# Patient Record
Sex: Male | Born: 1975 | Race: White | Hispanic: No | Smoking: Former smoker
Health system: Southern US, Community
[De-identification: ages and names within clinical notes are randomized; demographics above are authoritative.]

## PROBLEM LIST (undated history)

## (undated) DIAGNOSIS — D649 Anemia, unspecified: Secondary | ICD-10-CM

## (undated) DIAGNOSIS — N289 Disorder of kidney and ureter, unspecified: Secondary | ICD-10-CM

## (undated) DIAGNOSIS — G4700 Insomnia, unspecified: Secondary | ICD-10-CM

## (undated) DIAGNOSIS — I219 Acute myocardial infarction, unspecified: Secondary | ICD-10-CM

## (undated) DIAGNOSIS — F4310 Post-traumatic stress disorder, unspecified: Secondary | ICD-10-CM

## (undated) DIAGNOSIS — M1990 Unspecified osteoarthritis, unspecified site: Secondary | ICD-10-CM

## (undated) DIAGNOSIS — Z9289 Personal history of other medical treatment: Secondary | ICD-10-CM

## (undated) DIAGNOSIS — M797 Fibromyalgia: Secondary | ICD-10-CM

## (undated) DIAGNOSIS — I639 Cerebral infarction, unspecified: Secondary | ICD-10-CM

## (undated) DIAGNOSIS — Z95818 Presence of other cardiac implants and grafts: Secondary | ICD-10-CM

## (undated) DIAGNOSIS — R55 Syncope and collapse: Secondary | ICD-10-CM

## (undated) DIAGNOSIS — I2510 Atherosclerotic heart disease of native coronary artery without angina pectoris: Secondary | ICD-10-CM

## (undated) DIAGNOSIS — K5900 Constipation, unspecified: Secondary | ICD-10-CM

## (undated) DIAGNOSIS — J45909 Unspecified asthma, uncomplicated: Secondary | ICD-10-CM

## (undated) DIAGNOSIS — E119 Type 2 diabetes mellitus without complications: Secondary | ICD-10-CM

## (undated) DIAGNOSIS — I10 Essential (primary) hypertension: Secondary | ICD-10-CM

## (undated) DIAGNOSIS — F319 Bipolar disorder, unspecified: Secondary | ICD-10-CM

## (undated) DIAGNOSIS — F419 Anxiety disorder, unspecified: Secondary | ICD-10-CM

## (undated) DIAGNOSIS — I82409 Acute embolism and thrombosis of unspecified deep veins of unspecified lower extremity: Secondary | ICD-10-CM

## (undated) DIAGNOSIS — N186 End stage renal disease: Secondary | ICD-10-CM

## (undated) DIAGNOSIS — E785 Hyperlipidemia, unspecified: Secondary | ICD-10-CM

## (undated) DIAGNOSIS — R0600 Dyspnea, unspecified: Secondary | ICD-10-CM

## (undated) DIAGNOSIS — G4730 Sleep apnea, unspecified: Secondary | ICD-10-CM

## (undated) DIAGNOSIS — Z9889 Other specified postprocedural states: Secondary | ICD-10-CM

## (undated) DIAGNOSIS — I739 Peripheral vascular disease, unspecified: Secondary | ICD-10-CM

## (undated) HISTORY — PX: AV FISTULA PLACEMENT: SHX1204

## (undated) HISTORY — PX: HERNIA REPAIR: SHX51

## (undated) HISTORY — PX: TONSILLECTOMY: SUR1361

## (undated) HISTORY — PX: CHOLECYSTECTOMY: SHX55

## (undated) HISTORY — PX: JOINT REPLACEMENT: SHX530

## (undated) HISTORY — PX: AV FISTULA INSERTION W/ RF MAGNETIC GUIDANCE: CATH118308

---

## 2016-03-14 DIAGNOSIS — E785 Hyperlipidemia, unspecified: Secondary | ICD-10-CM | POA: Insufficient documentation

## 2017-04-29 DIAGNOSIS — L299 Pruritus, unspecified: Secondary | ICD-10-CM | POA: Insufficient documentation

## 2017-04-29 DIAGNOSIS — K582 Mixed irritable bowel syndrome: Secondary | ICD-10-CM | POA: Insufficient documentation

## 2017-04-29 DIAGNOSIS — N051 Unspecified nephritic syndrome with focal and segmental glomerular lesions: Secondary | ICD-10-CM | POA: Insufficient documentation

## 2017-07-09 DIAGNOSIS — G4733 Obstructive sleep apnea (adult) (pediatric): Secondary | ICD-10-CM | POA: Insufficient documentation

## 2017-09-21 DIAGNOSIS — Z992 Dependence on renal dialysis: Secondary | ICD-10-CM | POA: Insufficient documentation

## 2019-03-14 DIAGNOSIS — K219 Gastro-esophageal reflux disease without esophagitis: Secondary | ICD-10-CM | POA: Insufficient documentation

## 2019-03-14 DIAGNOSIS — M47812 Spondylosis without myelopathy or radiculopathy, cervical region: Secondary | ICD-10-CM | POA: Insufficient documentation

## 2019-03-21 ENCOUNTER — Encounter (HOSPITAL_COMMUNITY): Payer: Self-pay | Admitting: Emergency Medicine

## 2019-03-21 ENCOUNTER — Other Ambulatory Visit: Payer: Self-pay

## 2019-03-21 ENCOUNTER — Emergency Department (HOSPITAL_COMMUNITY): Payer: Medicare Other

## 2019-03-21 ENCOUNTER — Inpatient Hospital Stay (HOSPITAL_COMMUNITY)
Admission: EM | Admit: 2019-03-21 | Discharge: 2019-03-24 | DRG: 280 | Disposition: A | Discharge status: Home / Self Care | Payer: Medicare Other | Attending: Internal Medicine | Admitting: Internal Medicine

## 2019-03-21 DIAGNOSIS — Z20822 Contact with and (suspected) exposure to covid-19: Secondary | ICD-10-CM | POA: Diagnosis present

## 2019-03-21 DIAGNOSIS — Z6841 Body Mass Index (BMI) 40.0 and over, adult: Secondary | ICD-10-CM

## 2019-03-21 DIAGNOSIS — Z87891 Personal history of nicotine dependence: Secondary | ICD-10-CM

## 2019-03-21 DIAGNOSIS — I69354 Hemiplegia and hemiparesis following cerebral infarction affecting left non-dominant side: Secondary | ICD-10-CM

## 2019-03-21 DIAGNOSIS — D631 Anemia in chronic kidney disease: Secondary | ICD-10-CM | POA: Diagnosis present

## 2019-03-21 DIAGNOSIS — Z885 Allergy status to narcotic agent status: Secondary | ICD-10-CM

## 2019-03-21 DIAGNOSIS — I214 Non-ST elevation (NSTEMI) myocardial infarction: Principal | ICD-10-CM

## 2019-03-21 DIAGNOSIS — R55 Syncope and collapse: Secondary | ICD-10-CM | POA: Diagnosis not present

## 2019-03-21 DIAGNOSIS — G43909 Migraine, unspecified, not intractable, without status migrainosus: Secondary | ICD-10-CM | POA: Diagnosis present

## 2019-03-21 DIAGNOSIS — R079 Chest pain, unspecified: Secondary | ICD-10-CM | POA: Diagnosis not present

## 2019-03-21 DIAGNOSIS — I639 Cerebral infarction, unspecified: Secondary | ICD-10-CM

## 2019-03-21 DIAGNOSIS — Z91040 Latex allergy status: Secondary | ICD-10-CM

## 2019-03-21 DIAGNOSIS — I25111 Atherosclerotic heart disease of native coronary artery with angina pectoris with documented spasm: Secondary | ICD-10-CM | POA: Diagnosis not present

## 2019-03-21 DIAGNOSIS — E876 Hypokalemia: Secondary | ICD-10-CM | POA: Diagnosis not present

## 2019-03-21 DIAGNOSIS — I120 Hypertensive chronic kidney disease with stage 5 chronic kidney disease or end stage renal disease: Secondary | ICD-10-CM | POA: Diagnosis present

## 2019-03-21 DIAGNOSIS — E119 Type 2 diabetes mellitus without complications: Secondary | ICD-10-CM

## 2019-03-21 DIAGNOSIS — D696 Thrombocytopenia, unspecified: Secondary | ICD-10-CM | POA: Diagnosis present

## 2019-03-21 DIAGNOSIS — Z79899 Other long term (current) drug therapy: Secondary | ICD-10-CM

## 2019-03-21 DIAGNOSIS — Z992 Dependence on renal dialysis: Secondary | ICD-10-CM

## 2019-03-21 DIAGNOSIS — Z23 Encounter for immunization: Secondary | ICD-10-CM

## 2019-03-21 DIAGNOSIS — Z794 Long term (current) use of insulin: Secondary | ICD-10-CM

## 2019-03-21 DIAGNOSIS — Z881 Allergy status to other antibiotic agents status: Secondary | ICD-10-CM

## 2019-03-21 DIAGNOSIS — Z8249 Family history of ischemic heart disease and other diseases of the circulatory system: Secondary | ICD-10-CM

## 2019-03-21 DIAGNOSIS — I951 Orthostatic hypotension: Secondary | ICD-10-CM | POA: Diagnosis present

## 2019-03-21 DIAGNOSIS — Z833 Family history of diabetes mellitus: Secondary | ICD-10-CM

## 2019-03-21 DIAGNOSIS — Z9049 Acquired absence of other specified parts of digestive tract: Secondary | ICD-10-CM

## 2019-03-21 DIAGNOSIS — E1122 Type 2 diabetes mellitus with diabetic chronic kidney disease: Secondary | ICD-10-CM | POA: Diagnosis present

## 2019-03-21 DIAGNOSIS — E785 Hyperlipidemia, unspecified: Secondary | ICD-10-CM | POA: Diagnosis present

## 2019-03-21 DIAGNOSIS — N186 End stage renal disease: Secondary | ICD-10-CM

## 2019-03-21 DIAGNOSIS — Z8349 Family history of other endocrine, nutritional and metabolic diseases: Secondary | ICD-10-CM

## 2019-03-21 HISTORY — DX: Cerebral infarction, unspecified: I63.9

## 2019-03-21 HISTORY — DX: Essential (primary) hypertension: I10

## 2019-03-21 HISTORY — DX: Unspecified osteoarthritis, unspecified site: M19.90

## 2019-03-21 HISTORY — DX: Syncope and collapse: R55

## 2019-03-21 HISTORY — DX: Disorder of kidney and ureter, unspecified: N28.9

## 2019-03-21 HISTORY — DX: Type 2 diabetes mellitus without complications: E11.9

## 2019-03-21 HISTORY — DX: Other specified postprocedural states: Z98.890

## 2019-03-21 LAB — BASIC METABOLIC PANEL
Anion gap: 18 — ABNORMAL HIGH (ref 5–15)
BUN: 58 mg/dL — ABNORMAL HIGH (ref 6–20)
CO2: 24 mmol/L (ref 22–32)
Calcium: 10.2 mg/dL (ref 8.9–10.3)
Chloride: 92 mmol/L — ABNORMAL LOW (ref 98–111)
Creatinine, Ser: 15.03 mg/dL — ABNORMAL HIGH (ref 0.61–1.24)
GFR calc Af Amer: 4 mL/min — ABNORMAL LOW (ref >60)
GFR calc non Af Amer: 3 mL/min — ABNORMAL LOW (ref >60)
Glucose, Bld: 158 mg/dL — ABNORMAL HIGH (ref 70–99)
Potassium: 3 mmol/L — ABNORMAL LOW (ref 3.5–5.1)
Sodium: 134 mmol/L — ABNORMAL LOW (ref 135–145)

## 2019-03-21 LAB — CBC WITH DIFFERENTIAL/PLATELET
Abs Immature Granulocytes: 0.06 10*3/uL (ref 0.00–0.07)
Basophils Absolute: 0.1 10*3/uL (ref 0.0–0.1)
Basophils Relative: 1 %
Eosinophils Absolute: 0.1 10*3/uL (ref 0.0–0.5)
Eosinophils Relative: 1 %
HCT: 57.5 % — ABNORMAL HIGH (ref 39.0–52.0)
Hemoglobin: 18.8 g/dL — ABNORMAL HIGH (ref 13.0–17.0)
Immature Granulocytes: 1 %
Lymphocytes Relative: 25 %
Lymphs Abs: 2.5 10*3/uL (ref 0.7–4.0)
MCH: 29.9 pg (ref 26.0–34.0)
MCHC: 32.7 g/dL (ref 30.0–36.0)
MCV: 91.4 fL (ref 80.0–100.0)
Monocytes Absolute: 1.2 10*3/uL — ABNORMAL HIGH (ref 0.1–1.0)
Monocytes Relative: 12 %
Neutro Abs: 6.3 10*3/uL (ref 1.7–7.7)
Neutrophils Relative %: 60 %
Platelets: 101 10*3/uL — ABNORMAL LOW (ref 150–400)
RBC: 6.29 MIL/uL — ABNORMAL HIGH (ref 4.22–5.81)
RDW: 18 % — ABNORMAL HIGH (ref 11.5–15.5)
WBC: 10.2 10*3/uL (ref 4.0–10.5)
nRBC: 0.3 % — ABNORMAL HIGH (ref 0.0–0.2)

## 2019-03-21 LAB — MAGNESIUM: Magnesium: 1.8 mg/dL (ref 1.7–2.4)

## 2019-03-21 LAB — TROPONIN I (HIGH SENSITIVITY)
Troponin I (High Sensitivity): 682 ng/L (ref <18)
Troponin I (High Sensitivity): 939 ng/L (ref <18)

## 2019-03-21 LAB — RESPIRATORY PANEL BY RT PCR (FLU A&B, COVID)
Influenza A by PCR: NEGATIVE
Influenza B by PCR: NEGATIVE
SARS Coronavirus 2 by RT PCR: NEGATIVE

## 2019-03-21 IMAGING — DX DG CHEST 1V PORT
1 series · 1 of 1 positions shown · non-contrast
Comparison: None.

CLINICAL DATA: 41-year-old male with chest pain.

EXAM:
PORTABLE CHEST 1 VIEW

[chest ap]
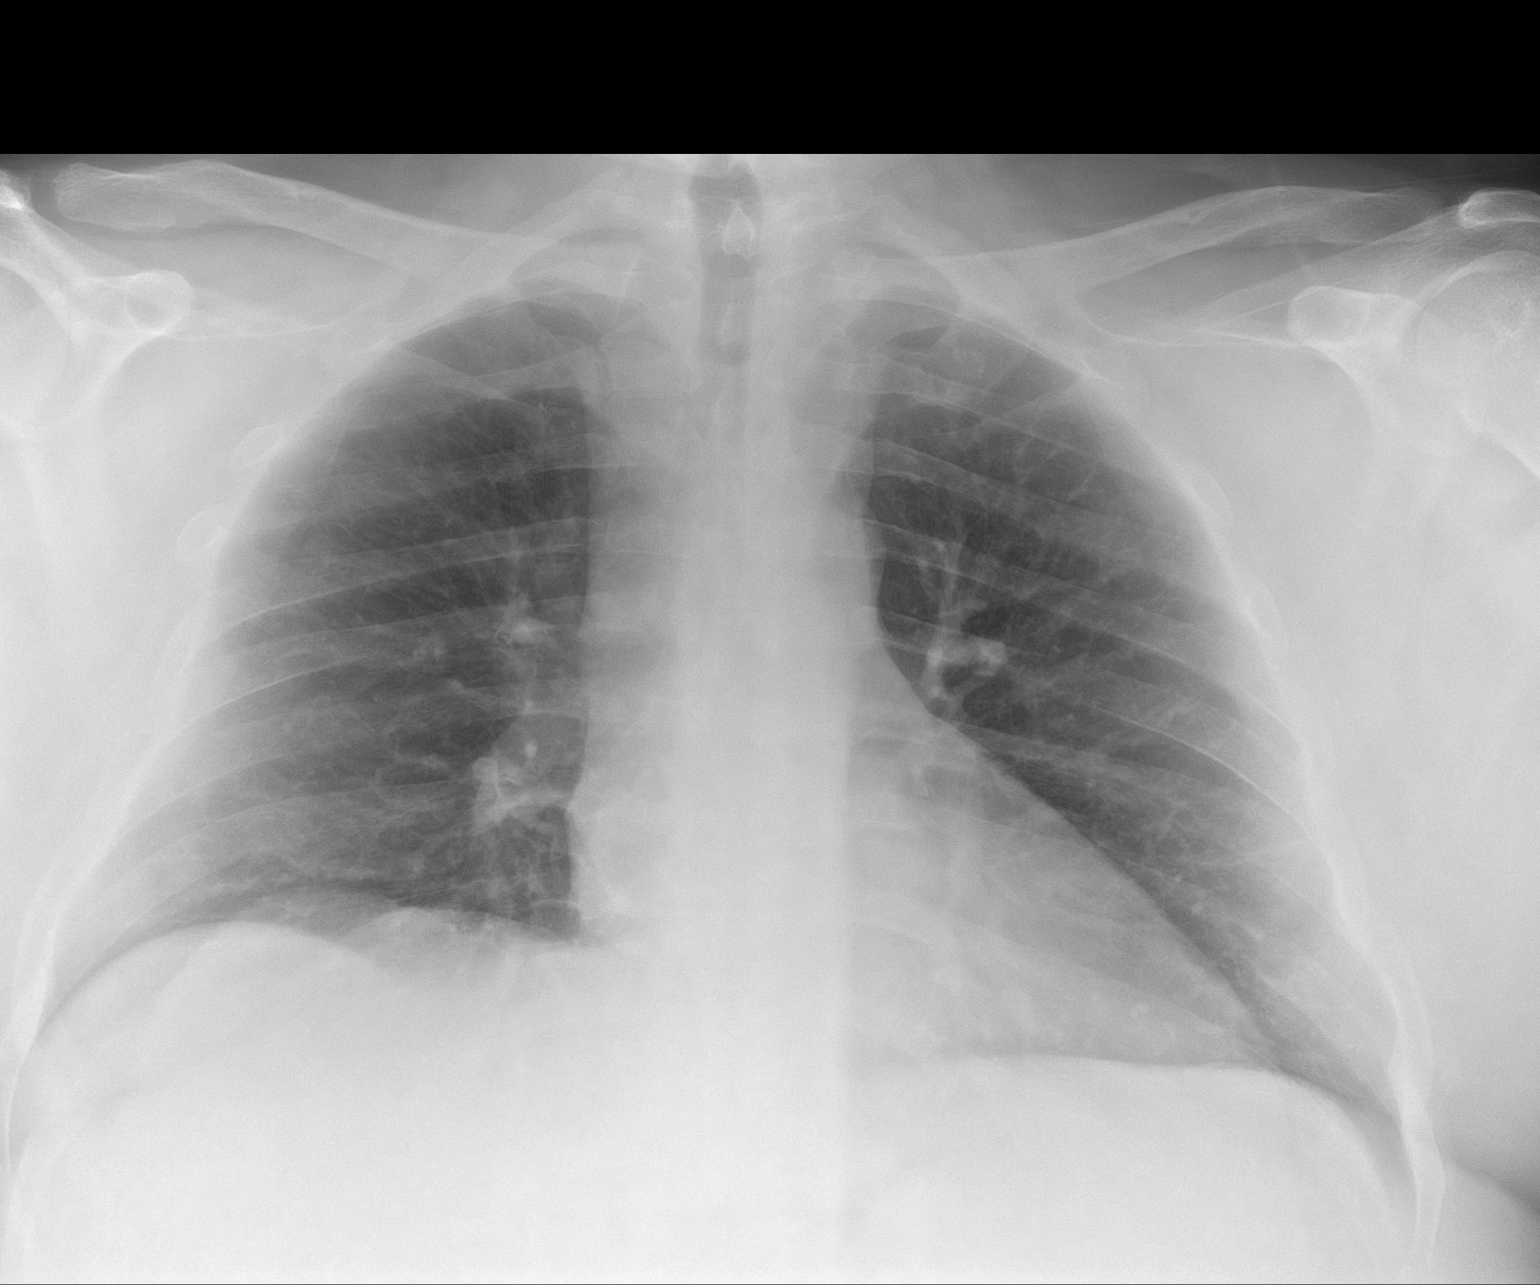

[1 of 1 positions shown; findings below may reference images not displayed]

FINDINGS: The heart size and mediastinal contours are within normal limits.
Both lungs are clear. The visualized skeletal structures are
unremarkable.
IMPRESSION: No active disease.

## 2019-03-21 MED ORDER — POTASSIUM CHLORIDE CRYS ER 20 MEQ PO TBCR
20.0000 meq | EXTENDED_RELEASE_TABLET | Freq: Once | ORAL | Status: AC
  Administered 2019-03-21: 20 meq via ORAL
  Filled 2019-03-21: qty 1

## 2019-03-21 MED ORDER — ASPIRIN 325 MG PO TABS
325.0000 mg | ORAL_TABLET | Freq: Once | ORAL | Status: DC
  Filled 2019-03-21: qty 1

## 2019-03-21 MED ORDER — ATORVASTATIN CALCIUM 40 MG PO TABS
40.0000 mg | ORAL_TABLET | Freq: Every day | ORAL | Status: DC
  Administered 2019-03-22 – 2019-03-23 (×2): 40 mg via ORAL
  Filled 2019-03-21 (×2): qty 1

## 2019-03-21 MED ORDER — SODIUM CHLORIDE 0.9 % IV BOLUS
500.0000 mL | Freq: Once | INTRAVENOUS | Status: AC
  Administered 2019-03-21: 500 mL via INTRAVENOUS

## 2019-03-21 NOTE — ED Triage Notes (Signed)
Patient complains of multiple syncopal episodes, up to 15 times in the last five days.

## 2019-03-21 NOTE — ED Notes (Signed)
Date and time results received: 03/21/19 7:16 PM    Test: Trop  Critical Value: 682  Name of Provider Notified: Dr. Melina Copa  Orders Received? Or Actions Taken?: See chart

## 2019-03-21 NOTE — ED Provider Notes (Signed)
[facility] EMERGENCY DEPARTMENT Provider Note   CSN: [id number] Arrival date & time: 03/21/19  1316     History Chief Complaint  Patient presents with  . syncopal episodes    Max Pittman is a 44 y.o. male.  He has a history of hypertension diabetes and renal disease on peritoneal dialysis.  He also with has a history of vasovagal syncope.  He said he has been passing out more than normal for the last 5 days.  He thinks it has been about 15 times.  He is lightheaded and noticed some low blood pressures.  He also has some chest discomfort and shortness of breath it has been going on for the last 5 days.  No change in his dialysis.  Complaining of some headache and photophobia that he says is his migraine.  He talked to his primary care doctor today who recommended he come here and get some blood work done.  The history is provided by the patient.  Loss of Consciousness Episode history:  Multiple Most recent episode:  Yesterday Timing:  Intermittent Progression:  Unchanged Chronicity:  Recurrent Context: normal activity   Witnessed: no   Relieved by:  Nothing Worsened by:  Nothing Ineffective treatments:  None tried Associated symptoms: chest pain, headaches and shortness of breath   Associated symptoms: no confusion, no difficulty breathing, no fever, no focal weakness, no nausea, no rectal bleeding and no vomiting        Past Medical History:  Diagnosis Date  . Arthritis   . Diabetes ([place])   . History of peritoneal dialysis   . Hypertension   . Renal disorder    FFGS  . Stroke [facility])    when ha was a child  . Vasovagal syndrome    with syncope    There are no problems to display for this patient.   Past Surgical History:  Procedure Laterality Date  . AV FISTULA INSERTION W/ RF MAGNETIC GUIDANCE Left   . CHOLECYSTECTOMY    . HERNIA REPAIR    . TONSILLECTOMY         No family history on file.  Social History   Tobacco Use  . Smoking status: Former Smoker     Quit date: 11/16/2018    Years since quitting: 0.3  . Smokeless tobacco: Former Network engineer Use Topics  . Alcohol use: Not on file    Comment: occ  . Drug use: Never    Home Medications Prior to Admission medications   Not on File    Allergies    Doxycycline and Morphine and related  Review of Systems   Review of Systems  Constitutional: Negative for fever.  HENT: Negative for sore throat.   Eyes: Positive for photophobia. Negative for visual disturbance.  Respiratory: Positive for shortness of breath.   Cardiovascular: Positive for chest pain and syncope.  Gastrointestinal: Negative for abdominal pain, nausea and vomiting.  Genitourinary: Negative for dysuria.  Musculoskeletal: Negative for neck pain.  Skin: Negative for rash.  Neurological: Positive for headaches. Negative for focal weakness and speech difficulty.  Psychiatric/Behavioral: Negative for confusion.    Physical Exam Updated Vital Signs BP 132/80 (BP Location: Right Arm)   Pulse (!) 118   Temp 97.7 F (36.5 C)   Resp 16   SpO2 94%   Physical Exam Vitals and nursing note reviewed.  Constitutional:      Appearance: He is well-developed.  HENT:     Head: Normocephalic and atraumatic.  Eyes:  Conjunctiva/sclera: Conjunctivae normal.  Cardiovascular:     Rate and Rhythm: Normal rate and regular rhythm.     Heart sounds: No murmur.  Pulmonary:     Effort: Pulmonary effort is normal. No respiratory distress.     Breath sounds: Normal breath sounds.  Abdominal:     Palpations: Abdomen is soft.     Tenderness: There is no abdominal tenderness. There is no guarding or rebound.  Musculoskeletal:        General: No deformity or signs of injury. Normal range of motion.     Cervical back: Neck supple.  Skin:    General: Skin is warm and dry.     Capillary Refill: Capillary refill takes less than 2 seconds.  Neurological:     General: No focal deficit present.     Mental Status: He is alert.      ED Results / Procedures / Treatments   Labs (all labs ordered are listed, but only abnormal results are displayed) Labs Reviewed  CBC WITH DIFFERENTIAL/PLATELET - Abnormal; Notable for the following components:      Result Value   RBC 6.29 (*)    Hemoglobin 18.8 (*)    HCT 57.5 (*)    RDW 18.0 (*)    Platelets 101 (*)    nRBC 0.3 (*)    Monocytes Absolute 1.2 (*)    All other components within normal limits  BASIC METABOLIC PANEL - Abnormal; Notable for the following components:   Sodium 134 (*)    Potassium 3.0 (*)    Chloride 92 (*)    Glucose, Bld 158 (*)    BUN 58 (*)    Creatinine, Ser 15.03 (*)    GFR calc non Af Amer 3 (*)    GFR calc Af Amer 4 (*)    Anion gap 18 (*)    All other components within normal limits  HEMOGLOBIN A1C - Abnormal; Notable for the following components:   Hgb A1c MFr Bld 6.6 (*)    All other components within normal limits  LIPID PANEL - Abnormal; Notable for the following components:   Triglycerides 165 (*)    HDL 29 (*)    All other components within normal limits  TROPONIN I (HIGH SENSITIVITY) - Abnormal; Notable for the following components:   Troponin I (High Sensitivity) 682 (*)    All other components within normal limits  TROPONIN I (HIGH SENSITIVITY) - Abnormal; Notable for the following components:   Troponin I (High Sensitivity) 939 (*)    All other components within normal limits  TROPONIN I (HIGH SENSITIVITY) - Abnormal; Notable for the following components:   Troponin I (High Sensitivity) 1,997 (*)    All other components within normal limits  TROPONIN I (HIGH SENSITIVITY) - Abnormal; Notable for the following components:   Troponin I (High Sensitivity) 3,490 (*)    All other components within normal limits  RESPIRATORY PANEL BY RT PCR (FLU A&B, COVID)  MAGNESIUM  HEPARIN LEVEL (UNFRACTIONATED)  HIV ANTIBODY (ROUTINE TESTING W REFLEX)  HEPARIN LEVEL (UNFRACTIONATED)    EKG EKG  Interpretation  Date/Time:  Tuesday March 21 2019 13:47:14 EST Ventricular Rate:  119 PR Interval:  176 QRS Duration: 82 QT Interval:  320 QTC Calculation: 450 R Axis:   -167 Text Interpretation: Sinus tachycardia Right ventricular hypertrophy Septal infarct , age undetermined Lateral infarct , age undetermined Abnormal ECG No old tracing to compare Confirmed by Aletta Edouard [phone]) on 03/21/2019 6:27:35 PM   Radiology DG  Chest Port 1 View  Result Date: 03/21/2019 CLINICAL DATA:  44 year old male with chest pain. EXAM: PORTABLE CHEST 1 VIEW COMPARISON:  None. FINDINGS: The heart size and mediastinal contours are within normal limits. Both lungs are clear. The visualized skeletal structures are unremarkable. IMPRESSION: No active disease. Electronically Signed   By: Anner Crete M.D.   On: 03/21/2019 19:16    Procedures .Critical Care Performed by: Hayden Rasmussen, MD Authorized by: Hayden Rasmussen, MD   Critical care provider statement:    Critical care time (minutes):  45   Critical care time was exclusive of:  Separately billable procedures and treating other patients   Critical care was necessary to treat or prevent imminent or life-threatening deterioration of the following conditions:  Cardiac failure and circulatory failure   Critical care was time spent personally by me on the following activities:  Discussions with consultants, evaluation of patient's response to treatment, examination of patient, ordering and performing treatments and interventions, ordering and review of laboratory studies, ordering and review of radiographic studies, pulse oximetry, re-evaluation of patient's condition, obtaining history from patient or surrogate, review of old charts and development of treatment plan with patient or surrogate   I assumed direction of critical care for this patient from another provider in my specialty: no     (including critical care time)  Medications Ordered  in ED Medications  buPROPion (WELLBUTRIN SR) 12 hr tablet 150 mg (150 mg Oral Not Given 03/22/19 0805)  DULoxetine (CYMBALTA) DR capsule 60 mg (60 mg Oral Given 03/22/19 0804)  pregabalin (LYRICA) capsule 100 mg (100 mg Oral Given 03/22/19 0804)  acetaminophen (TYLENOL) tablet 650 mg (has no administration in time range)  ondansetron (ZOFRAN) injection 4 mg (has no administration in time range)  aspirin tablet 325 mg (325 mg Oral Not Given 03/22/19 0228)  atorvastatin (LIPITOR) tablet 40 mg (40 mg Oral Not Given 03/22/19 0229)  heparin ADULT infusion 100 units/mL (25000 units/229mL sodium chloride 0.45%) (1,100 Units/hr Intravenous New Bag/Given 03/22/19 0244)  famotidine (PEPCID) tablet 20 mg (20 mg Oral Given 03/22/19 0805)  pneumococcal 23 valent vaccine (PNEUMOVAX-23) injection 0.5 mL (has no administration in time range)  sevelamer carbonate (RENVELA) tablet 3,200 mg (3,200 mg Oral Not Given 03/22/19 0805)  sevelamer carbonate (RENVELA) tablet 1,600 mg (has no administration in time range)  nitroGLYCERIN (NITROSTAT) SL tablet 0.4 mg (has no administration in time range)  sodium chloride 0.9 % bolus 500 mL (0 mLs Intravenous Stopped 03/21/19 2143)  potassium chloride SA (KLOR-CON) CR tablet 20 mEq (20 mEq Oral Given 03/21/19 2139)  magnesium sulfate IVPB 2 g 50 mL (0 g Intravenous Stopped 03/22/19 0355)  potassium chloride SA (KLOR-CON) CR tablet 40 mEq (40 mEq Oral Given 03/22/19 0249)  heparin bolus via infusion 2,500 Units (2,500 Units Intravenous Bolus from Bag 03/22/19 0245)    ED Course  I have reviewed the triage vital signs and the nursing notes.  Pertinent labs & imaging results that were available during my care of the patient were reviewed by me and considered in my medical decision making (see chart for details).  Clinical Course as of Mar 21 1012  Tue Mar 20, 7762  [id number] 44 year old male multiple medical problems here with multiple episodes of syncope over the past few days.   Tachycardic here.  EKG abnormal with no priors to compare with.  Differential includes arrhythmia, metabolic derangement, hypovolemia, ACS, infection   [MB]  2112 Troponin elevated here at 682.  Potassium mildly  low at 3.0 and magnesium normal.  Creatinine elevated at 15 but he has a peritoneal dialysis patient.    [MB]  2112 Discussed with Dr. Denton Brick from [facility] who will evaluate the patient for admission down to [place].   [MB]  2119 Discussed with Dr. Kalman Shan [facility] cardiology.  He feels the patient will need to be admitted for an echo.  States this may be more infiltrative for cardiomyopathy then ischemia.  Recommend holding on heparin unless delta troponin rising significantly.   [MB]    Clinical Course User Index [MB] Hayden Rasmussen, MD   MDM Rules/Calculators/A&P                       Final Clinical Impression(s) / ED Diagnoses Final diagnoses:  Syncope, unspecified syncope type  ESRD on peritoneal dialysis [facility])  Hypokalemia  NSTEMI (non-ST elevated myocardial infarction) [facility])    Rx / DC Orders ED Discharge Orders    None       Hayden Rasmussen, MD 03/22/19 1017

## 2019-03-21 NOTE — ED Notes (Signed)
Date and time results received: 03/21/19 2200 (use smartphrase ".now" to insert current time)  Test: troponin Critical Value: 939  Name of Provider Notified: Dr. Denton Brick notified via [place]? Or Actions Taken?:

## 2019-03-21 NOTE — H&P (Addendum)
History and Physical    Max Pittman [id number] DOB: 12/26/1975 DOA: 03/21/2019  PCP: Chesley Noon, MD   Patient coming from: Chest Pain, syncope  I have personally briefly reviewed patient's old medical records in [place]  Chief Complaint: Passing out, chest pain  HPI: Max Pittman is a 44 y.o. male with medical history significant for peritoneal hemodialysis, stroke, hypertension and diabetes mellitus, vasovagal syncope Reports syncopal episodes up to 15 times a day over the past 5 days.  Reports lightheadedness and noted some low blood pressures.  Patient also reported persistent chest pain over the past 2 days, left to right side of his chest anteriorly, with associated difficulty breathing.  Chest pain started in the morning when he woke up, and is worse with activity and improves with rest.  He denies associated nausea vomiting or diaphoresis.   He denies history of heart attacks, but reports a stroke when he was 44 years old, with mild residual left upper and lower extremity weakness, but he is able to ambulate without any assistance or assistive devices..  No leg swelling redness or pain.  He does not take aspirin daily.  He quit smoking cigarettes about 20 half years ago, and quit vaping 6 months ago.  He very rarely drinks alcohol maybe once in a year.  He denies known family history of premature coronary artery disease.  ED Course: Heart rates 99-1 1 8, blood pressure systolic 123456 32, O2 sats greater than 94% on room air.  Platelets low 101.  Hs troponin 682 >> 939.  EKG showed sinus tachycardia, with ST abnormalities concerning for ACS. EDP talked to cardiologist on-call, recommended echocardiogram, start anticoagulation if troponin significantly more elevated.  Hospitalist to admit for further evaluation and management.  Review of Systems: As per HPI all other systems reviewed and negative.  Past Medical History:  Diagnosis Date  . Arthritis   . Diabetes ([place])   .  History of peritoneal dialysis   . Hypertension   . Renal disorder    FFGS  . Stroke [facility])    when ha was a child  . Vasovagal syndrome    with syncope     reports that he quit smoking about 4 months ago. He has quit using smokeless tobacco. He reports that he does not use drugs. No history on file for alcohol.  Allergies  Allergen Reactions  . Doxycycline Hives  . Latex Swelling    Pt reports swelling at site.   . Morphine And Related Anxiety   No family history of premature coronary artery disease.  Prior to Admission medications   Medication Sig Start Date End Date Taking? Authorizing Provider  atorvastatin (LIPITOR) 20 MG tablet Take 20 mg by mouth daily. 01/12/19  Yes [provider]  buPROPion (WELLBUTRIN SR) 150 MG 12 hr tablet Take 150 mg by mouth 2 (two) times daily. 01/20/19  Yes [provider]  DULoxetine (CYMBALTA) 60 MG capsule Take 60 mg by mouth 2 (two) times daily. 01/11/19  Yes [provider]  furosemide (LASIX) 40 MG tablet Take 120 mg by mouth in the morning and at bedtime.   Yes [provider]  gentamicin cream (GARAMYCIN) 0.1 % Apply 1 application topically every other day. Applied to catheter post cleaning   Yes [provider]  HYDROcodone-acetaminophen (NORCO/VICODIN) 5-325 MG tablet Take 1 tablet by mouth every 8 (eight) hours as needed for moderate pain.  02/20/19  Yes [provider]  hydrOXYzine (ATARAX/VISTARIL) 25  MG tablet Take 25 mg by mouth every 8 (eight) hours as needed for itching.  01/21/19  Yes [provider]  Insulin Glargine (BASAGLAR KWIKPEN) 100 UNIT/ML SOPN Inject 22 Units into the skin every evening.  03/10/19  Yes [provider]  pioglitazone (ACTOS) 45 MG tablet Take 45 mg by mouth daily. 01/21/19  Yes [provider]  pregabalin (LYRICA) 100 MG capsule Take 100 mg by mouth 3 (three) times daily. 02/20/19  Yes [provider]  RENVELA 800 MG  tablet Take 1,600-3,200 mg by mouth See admin instructions. Take 4 tablets (3200mg  total)  by mouth with meals three times daily and take 2 tablets (1600mg  total) with snacks 12/07/18  Yes [provider]  tiZANidine (ZANAFLEX) 4 MG tablet Take 4 mg by mouth 3 (three) times daily. 03/02/19  Yes [provider]  TRULICITY 1.5 0000000 SOPN Inject 1.5 mg into the skin every Tuesday.  03/10/19  Yes [provider]  carvedilol (COREG) 25 MG tablet Take 25 mg by mouth 2 (two) times daily. 01/12/19   [provider]  hydrALAZINE (APRESOLINE) 25 MG tablet Take 25 mg by mouth 3 (three) times daily. 01/13/19   [provider]    Physical Exam: Vitals:   03/21/19 1905 03/21/19 1930 03/21/19 1935 03/21/19 2100  BP:  (!) 84/73 (!) 92/58 102/72  Pulse: 100 100  99  Resp:   (!) 31 16  Temp:      SpO2: 96% 98%  97%    Constitutional: NAD, calm, comfortable Vitals:   03/21/19 1905 03/21/19 1930 03/21/19 1935 03/21/19 2100  BP:  (!) 84/73 (!) 92/58 102/72  Pulse: 100 100  99  Resp:   (!) 31 16  Temp:      SpO2: 96% 98%  97%   Eyes: PERRL, lids and conjunctivae normal ENMT: Mucous membranes are moist. Posterior pharynx clear of any exudate or lesions.Normal dentition.  Neck: normal, supple, no masses, no thyromegaly Respiratory: clear to auscultation bilaterally, no wheezing, no crackles. Normal respiratory effort. No accessory muscle use.  Cardiovascular: Difficult to auscultate due to habitus, but no appreciable murmurs / rubs / gallops. No extremity edema. 2+ pedal pulses.   Abdomen: no tenderness, no masses palpated. No hepatosplenomegaly. Bowel sounds positive.  Musculoskeletal: no clubbing / cyanosis. No joint deformity upper and lower extremities. Good ROM, no contractures. Normal muscle tone.  Skin: no rashes, lesions, ulcers. No induration Neurologic: CN 2-12 grossly intact. Strength 5/5 in all 4.  Psychiatric: Normal judgment and insight. Alert and  oriented x 3. Normal mood.   Labs on Admission: I have personally reviewed following labs and imaging studies  CBC: Recent Labs  Lab 03/21/19 1815  WBC 10.2  NEUTROABS 6.3  HGB 18.8*  HCT 57.5*  MCV 91.4  PLT 99991111*   Basic Metabolic Panel: Recent Labs  Lab 03/21/19 1815  NA 134*  K 3.0*  CL 92*  CO2 24  GLUCOSE 158*  BUN 58*  CREATININE 15.03*  CALCIUM 10.2  MG 1.8    Radiological Exams on Admission: DG Chest Port 1 View  Result Date: 03/21/2019 CLINICAL DATA:  44 year old male with chest pain. EXAM: PORTABLE CHEST 1 VIEW COMPARISON:  None. FINDINGS: The heart size and mediastinal contours are within normal limits. Both lungs are clear. The visualized skeletal structures are unremarkable. IMPRESSION: No active disease. Electronically Signed   By: Anner Crete M.D.   On: 03/21/2019 19:16    EKG: Independently reviewed.  Sinus tachycardial, QTc  450.  Diffuse ST and T wave abnormalities.  Assessment/Plan Principal Problem:   Syncope Active Problems:   Chest pain   Peritoneal dialysis status (HCC)   Stroke (HCC)   Diabetes mellitus ([place])   Chest pain and syncope- troponin elevated at 682 >> 939.  EKG with diffuse ST and T wave abnormalities.  Concerning for acute coronary syndrome.  Denies premature coronary artery disease history.  But high risk-smoking history, BMI, diabetes.  Platelets low at 101.  Syncopal episode likely secondary to hypotension. -Admit to [place] talked to cardiology at [facility], patient to be seen, recommended not to start heparin unless troponin significantly elevated.  -Obtain echocardiogram -Care order instruction to page cardiology on arrival to [place] troponin -Aspirin 325x1, will defer starting anticoagulation to cardiology. -As needed sublingual nitroglycerin -With hypotensive blood pressure and syncopal episodes, home carvedilol 25mg  held for now -N.p.o. midnight, pending cardiology evaluation -Lipid panel -500  mill bolus normal saline given.  ESRD on Peritoneal dialysis status-barely makes any urine. -Please consult nephrology in a.m. -BMP a.m.  Thrombocytopenia-platelets 101.  Baseline platelets per care everywhere from 2019- 150s to 200s.  Denies alcohol use. -CBC a.m.  Diabetes mellitus-random glucose 158 -Obtain hemoglobin A1c -Hold home Lantus [id number] and Actos, Trulicity - SSI [id number]  Hypertension-hypotensive down to 85/41 -Hold home carvedilol, hydralazine,  History of stroke-reports very mild residual left-sided weakness but is able to ambulate without assistance.  DVT prophylaxis: Heparin Code Status: Full code Family Communication: None at bedside Disposition Plan: ~ 2 days, pending cardiology evaluation Consults called: Cardiology.  Please consult nephrology in the morning. Admission status: Observation, cardiac telemetry   Bethena Roys MD [facility]  03/21/2019, 11:36 PM

## 2019-03-22 ENCOUNTER — Encounter (HOSPITAL_COMMUNITY)
Admission: EM | Disposition: A | Discharge status: Home / Self Care | Payer: Self-pay | Source: Home / Self Care | Attending: Internal Medicine

## 2019-03-22 ENCOUNTER — Encounter (HOSPITAL_COMMUNITY): Payer: Self-pay | Admitting: Internal Medicine

## 2019-03-22 ENCOUNTER — Other Ambulatory Visit: Payer: Self-pay

## 2019-03-22 ENCOUNTER — Observation Stay (HOSPITAL_COMMUNITY): Payer: Medicare Other

## 2019-03-22 DIAGNOSIS — I25111 Atherosclerotic heart disease of native coronary artery with angina pectoris with documented spasm: Secondary | ICD-10-CM | POA: Diagnosis not present

## 2019-03-22 DIAGNOSIS — Z992 Dependence on renal dialysis: Secondary | ICD-10-CM | POA: Diagnosis not present

## 2019-03-22 DIAGNOSIS — Z881 Allergy status to other antibiotic agents status: Secondary | ICD-10-CM | POA: Diagnosis not present

## 2019-03-22 DIAGNOSIS — I120 Hypertensive chronic kidney disease with stage 5 chronic kidney disease or end stage renal disease: Secondary | ICD-10-CM | POA: Diagnosis present

## 2019-03-22 DIAGNOSIS — I214 Non-ST elevation (NSTEMI) myocardial infarction: Secondary | ICD-10-CM | POA: Diagnosis present

## 2019-03-22 DIAGNOSIS — E785 Hyperlipidemia, unspecified: Secondary | ICD-10-CM | POA: Diagnosis present

## 2019-03-22 DIAGNOSIS — Z9049 Acquired absence of other specified parts of digestive tract: Secondary | ICD-10-CM | POA: Diagnosis not present

## 2019-03-22 DIAGNOSIS — N186 End stage renal disease: Secondary | ICD-10-CM

## 2019-03-22 DIAGNOSIS — E1122 Type 2 diabetes mellitus with diabetic chronic kidney disease: Secondary | ICD-10-CM | POA: Diagnosis present

## 2019-03-22 DIAGNOSIS — R079 Chest pain, unspecified: Secondary | ICD-10-CM

## 2019-03-22 DIAGNOSIS — Z794 Long term (current) use of insulin: Secondary | ICD-10-CM | POA: Diagnosis not present

## 2019-03-22 DIAGNOSIS — I200 Unstable angina: Secondary | ICD-10-CM | POA: Diagnosis not present

## 2019-03-22 DIAGNOSIS — I2510 Atherosclerotic heart disease of native coronary artery without angina pectoris: Secondary | ICD-10-CM

## 2019-03-22 DIAGNOSIS — E876 Hypokalemia: Secondary | ICD-10-CM

## 2019-03-22 DIAGNOSIS — D631 Anemia in chronic kidney disease: Secondary | ICD-10-CM | POA: Diagnosis present

## 2019-03-22 DIAGNOSIS — I951 Orthostatic hypotension: Secondary | ICD-10-CM | POA: Diagnosis present

## 2019-03-22 DIAGNOSIS — Z87891 Personal history of nicotine dependence: Secondary | ICD-10-CM | POA: Diagnosis not present

## 2019-03-22 DIAGNOSIS — I208 Other forms of angina pectoris: Secondary | ICD-10-CM | POA: Diagnosis not present

## 2019-03-22 DIAGNOSIS — Z6841 Body Mass Index (BMI) 40.0 and over, adult: Secondary | ICD-10-CM | POA: Diagnosis not present

## 2019-03-22 DIAGNOSIS — Z91040 Latex allergy status: Secondary | ICD-10-CM | POA: Diagnosis not present

## 2019-03-22 DIAGNOSIS — Z79899 Other long term (current) drug therapy: Secondary | ICD-10-CM | POA: Diagnosis not present

## 2019-03-22 DIAGNOSIS — R55 Syncope and collapse: Secondary | ICD-10-CM | POA: Diagnosis not present

## 2019-03-22 DIAGNOSIS — D696 Thrombocytopenia, unspecified: Secondary | ICD-10-CM | POA: Diagnosis present

## 2019-03-22 DIAGNOSIS — E1169 Type 2 diabetes mellitus with other specified complication: Secondary | ICD-10-CM

## 2019-03-22 DIAGNOSIS — G43909 Migraine, unspecified, not intractable, without status migrainosus: Secondary | ICD-10-CM | POA: Diagnosis present

## 2019-03-22 DIAGNOSIS — Z20822 Contact with and (suspected) exposure to covid-19: Secondary | ICD-10-CM | POA: Diagnosis present

## 2019-03-22 DIAGNOSIS — Z885 Allergy status to narcotic agent status: Secondary | ICD-10-CM | POA: Diagnosis not present

## 2019-03-22 DIAGNOSIS — Z23 Encounter for immunization: Secondary | ICD-10-CM | POA: Diagnosis present

## 2019-03-22 DIAGNOSIS — I10 Essential (primary) hypertension: Secondary | ICD-10-CM

## 2019-03-22 DIAGNOSIS — I69354 Hemiplegia and hemiparesis following cerebral infarction affecting left non-dominant side: Secondary | ICD-10-CM | POA: Diagnosis not present

## 2019-03-22 HISTORY — PX: LEFT HEART CATH AND CORONARY ANGIOGRAPHY: CATH118249

## 2019-03-22 LAB — LIPID PANEL
Cholesterol: 115 mg/dL (ref 0–200)
HDL: 29 mg/dL — ABNORMAL LOW (ref >40)
LDL Cholesterol: 53 mg/dL (ref 0–99)
Total CHOL/HDL Ratio: 4 RATIO
Triglycerides: 165 mg/dL — ABNORMAL HIGH (ref <150)
VLDL: 33 mg/dL (ref 0–40)

## 2019-03-22 LAB — BASIC METABOLIC PANEL
Anion gap: 20 — ABNORMAL HIGH (ref 5–15)
BUN: 61 mg/dL — ABNORMAL HIGH (ref 6–20)
CO2: 20 mmol/L — ABNORMAL LOW (ref 22–32)
Calcium: 9.8 mg/dL (ref 8.9–10.3)
Chloride: 97 mmol/L — ABNORMAL LOW (ref 98–111)
Creatinine, Ser: 15.73 mg/dL — ABNORMAL HIGH (ref 0.61–1.24)
GFR calc Af Amer: 4 mL/min — ABNORMAL LOW (ref >60)
GFR calc non Af Amer: 3 mL/min — ABNORMAL LOW (ref >60)
Glucose, Bld: 109 mg/dL — ABNORMAL HIGH (ref 70–99)
Potassium: 3 mmol/L — ABNORMAL LOW (ref 3.5–5.1)
Sodium: 137 mmol/L (ref 135–145)

## 2019-03-22 LAB — GLUCOSE, CAPILLARY
Glucose-Capillary: 99 mg/dL (ref 70–99)
Glucose-Capillary: 104 mg/dL — ABNORMAL HIGH (ref 70–99)

## 2019-03-22 LAB — HEMOGLOBIN A1C
Hgb A1c MFr Bld: 6.6 % — ABNORMAL HIGH (ref 4.8–5.6)
Mean Plasma Glucose: 142.72 mg/dL

## 2019-03-22 LAB — TROPONIN I (HIGH SENSITIVITY)
Troponin I (High Sensitivity): 1997 ng/L (ref <18)
Troponin I (High Sensitivity): 3490 ng/L (ref <18)
Troponin I (High Sensitivity): 2914 ng/L (ref <18)

## 2019-03-22 LAB — ECHOCARDIOGRAM COMPLETE
Height: 69 in
Weight: 4640 oz

## 2019-03-22 LAB — HIV ANTIBODY (ROUTINE TESTING W REFLEX): HIV Screen 4th Generation wRfx: NONREACTIVE

## 2019-03-22 LAB — HEPARIN LEVEL (UNFRACTIONATED): Heparin Unfractionated: 0.47 IU/mL (ref 0.30–0.70)

## 2019-03-22 SURGERY — LEFT HEART CATH AND CORONARY ANGIOGRAPHY
Anesthesia: LOCAL

## 2019-03-22 MED ORDER — BUPROPION HCL ER (SR) 150 MG PO TB12
150.0000 mg | ORAL_TABLET | Freq: Two times a day (BID) | ORAL | Status: DC
  Administered 2019-03-22 – 2019-03-24 (×4): 150 mg via ORAL
  Filled 2019-03-22 (×6): qty 1

## 2019-03-22 MED ORDER — SODIUM CHLORIDE 0.9% FLUSH: 3.0000 mL | INTRAVENOUS | Status: DC | PRN

## 2019-03-22 MED ORDER — SODIUM CHLORIDE 0.9% FLUSH: 3.0000 mL | Freq: Two times a day (BID) | INTRAVENOUS | Status: DC

## 2019-03-22 MED ORDER — SEVELAMER CARBONATE 800 MG PO TABS: 1600.0000 mg | ORAL_TABLET | ORAL | Status: DC

## 2019-03-22 MED ORDER — DELFLEX-LC/1.5% DEXTROSE 344 MOSM/L IP SOLN: INTRAPERITONEAL | Status: DC

## 2019-03-22 MED ORDER — IOHEXOL 350 MG/ML SOLN
INTRAVENOUS | Status: DC | PRN
  Administered 2019-03-22: 75 mL via INTRA_ARTERIAL

## 2019-03-22 MED ORDER — GENTAMICIN SULFATE 0.1 % EX CREA
1.0000 "application " | TOPICAL_CREAM | Freq: Every day | CUTANEOUS | Status: DC
  Filled 2019-03-22: qty 15

## 2019-03-22 MED ORDER — POTASSIUM CHLORIDE CRYS ER 20 MEQ PO TBCR
40.0000 meq | EXTENDED_RELEASE_TABLET | Freq: Once | ORAL | Status: AC
  Administered 2019-03-22: 40 meq via ORAL
  Filled 2019-03-22: qty 2

## 2019-03-22 MED ORDER — FENTANYL CITRATE (PF) 100 MCG/2ML IJ SOLN
INTRAMUSCULAR | Status: AC
  Filled 2019-03-22: qty 2

## 2019-03-22 MED ORDER — SODIUM CHLORIDE 0.9 % IV SOLN: INTRAVENOUS | Status: DC

## 2019-03-22 MED ORDER — PREGABALIN 100 MG PO CAPS
100.0000 mg | ORAL_CAPSULE | Freq: Three times a day (TID) | ORAL | Status: DC
  Administered 2019-03-22 – 2019-03-24 (×7): 100 mg via ORAL
  Filled 2019-03-22 (×4): qty 1
  Filled 2019-03-22: qty 4
  Filled 2019-03-22 (×2): qty 1

## 2019-03-22 MED ORDER — PNEUMOCOCCAL VAC POLYVALENT 25 MCG/0.5ML IJ INJ
0.5000 mL | INJECTION | INTRAMUSCULAR | Status: AC
  Administered 2019-03-23: 0.5 mL via INTRAMUSCULAR
  Filled 2019-03-22: qty 0.5

## 2019-03-22 MED ORDER — FAMOTIDINE 20 MG PO TABS
20.0000 mg | ORAL_TABLET | Freq: Two times a day (BID) | ORAL | Status: DC
  Administered 2019-03-22 – 2019-03-23 (×3): 20 mg via ORAL
  Filled 2019-03-22 (×3): qty 1

## 2019-03-22 MED ORDER — HEPARIN SODIUM (PORCINE) 5000 UNIT/ML IJ SOLN: 5000.0000 [IU] | Freq: Three times a day (TID) | INTRAMUSCULAR | Status: DC

## 2019-03-22 MED ORDER — FENTANYL CITRATE (PF) 100 MCG/2ML IJ SOLN
INTRAMUSCULAR | Status: DC | PRN
  Administered 2019-03-22 (×2): 25 ug via INTRAVENOUS

## 2019-03-22 MED ORDER — HEPARIN (PORCINE) IN NACL 1000-0.9 UT/500ML-% IV SOLN
INTRAVENOUS | Status: AC
  Filled 2019-03-22: qty 1000

## 2019-03-22 MED ORDER — LIDOCAINE HCL (PF) 1 % IJ SOLN
INTRAMUSCULAR | Status: AC
  Filled 2019-03-22: qty 30

## 2019-03-22 MED ORDER — MIDAZOLAM HCL 2 MG/2ML IJ SOLN
INTRAMUSCULAR | Status: AC
  Filled 2019-03-22: qty 2

## 2019-03-22 MED ORDER — SODIUM CHLORIDE 0.9% FLUSH
3.0000 mL | Freq: Two times a day (BID) | INTRAVENOUS | Status: DC
  Administered 2019-03-22 (×2): 3 mL via INTRAVENOUS

## 2019-03-22 MED ORDER — ONDANSETRON HCL 4 MG/2ML IJ SOLN: 4.0000 mg | Freq: Four times a day (QID) | INTRAMUSCULAR | Status: DC | PRN

## 2019-03-22 MED ORDER — SODIUM CHLORIDE 0.9 % IV SOLN: 250.0000 mL | INTRAVENOUS | Status: DC | PRN

## 2019-03-22 MED ORDER — POTASSIUM CHLORIDE CRYS ER 20 MEQ PO TBCR: 20.0000 meq | EXTENDED_RELEASE_TABLET | Freq: Once | ORAL | Status: DC

## 2019-03-22 MED ORDER — NITROGLYCERIN 1 MG/10 ML FOR IR/CATH LAB
INTRA_ARTERIAL | Status: DC | PRN
  Administered 2019-03-22: 200 ug via INTRACORONARY

## 2019-03-22 MED ORDER — HEPARIN (PORCINE) IN NACL 1000-0.9 UT/500ML-% IV SOLN
INTRAVENOUS | Status: DC | PRN
  Administered 2019-03-22: 500 mL

## 2019-03-22 MED ORDER — ACETAMINOPHEN 325 MG PO TABS: 650.0000 mg | ORAL_TABLET | ORAL | Status: DC | PRN

## 2019-03-22 MED ORDER — IOHEXOL 350 MG/ML SOLN
INTRAVENOUS | Status: AC
  Filled 2019-03-22: qty 1

## 2019-03-22 MED ORDER — INSULIN ASPART 100 UNIT/ML ~~LOC~~ SOLN
0.0000 [IU] | SUBCUTANEOUS | Status: DC
  Administered 2019-03-23: 1 [IU] via SUBCUTANEOUS
  Administered 2019-03-23: 2 [IU] via SUBCUTANEOUS
  Administered 2019-03-24 (×2): 1 [IU] via SUBCUTANEOUS

## 2019-03-22 MED ORDER — SODIUM CHLORIDE 0.9 % IV BOLUS
1000.0000 mL | Freq: Once | INTRAVENOUS | Status: AC
  Administered 2019-03-22: 1000 mL via INTRAVENOUS

## 2019-03-22 MED ORDER — LIDOCAINE HCL (PF) 1 % IJ SOLN
INTRAMUSCULAR | Status: DC | PRN
  Administered 2019-03-22: 15 mL

## 2019-03-22 MED ORDER — HEPARIN BOLUS VIA INFUSION
2500.0000 [IU] | Freq: Once | INTRAVENOUS | Status: AC
  Administered 2019-03-22: 2500 [IU] via INTRAVENOUS

## 2019-03-22 MED ORDER — NITROGLYCERIN 0.4 MG SL SUBL: 0.4000 mg | SUBLINGUAL_TABLET | SUBLINGUAL | Status: DC | PRN

## 2019-03-22 MED ORDER — HEPARIN 1000 UNIT/ML FOR PERITONEAL DIALYSIS
INTRAPERITONEAL | Status: DC | PRN
  Filled 2019-03-22: qty 5000

## 2019-03-22 MED ORDER — SEVELAMER CARBONATE 800 MG PO TABS: 1600.0000 mg | ORAL_TABLET | ORAL | Status: DC | PRN

## 2019-03-22 MED ORDER — ASPIRIN 81 MG PO CHEW: 81.0000 mg | CHEWABLE_TABLET | ORAL | Status: DC

## 2019-03-22 MED ORDER — MIDAZOLAM HCL 2 MG/2ML IJ SOLN
INTRAMUSCULAR | Status: DC | PRN
  Administered 2019-03-22 (×2): 1 mg via INTRAVENOUS

## 2019-03-22 MED ORDER — MAGNESIUM SULFATE 2 GM/50ML IV SOLN
2.0000 g | Freq: Once | INTRAVENOUS | Status: AC
  Administered 2019-03-22: 2 g via INTRAVENOUS
  Filled 2019-03-22: qty 50

## 2019-03-22 MED ORDER — PERFLUTREN LIPID MICROSPHERE
1.0000 mL | INTRAVENOUS | Status: AC | PRN
  Administered 2019-03-22: 2 mL via INTRAVENOUS
  Filled 2019-03-22: qty 10

## 2019-03-22 MED ORDER — ASPIRIN 81 MG PO CHEW
81.0000 mg | CHEWABLE_TABLET | ORAL | Status: AC
  Administered 2019-03-22: 81 mg via ORAL
  Filled 2019-03-22: qty 1

## 2019-03-22 MED ORDER — HEPARIN (PORCINE) 25000 UT/250ML-% IV SOLN
1100.0000 [IU]/h | INTRAVENOUS | Status: DC
  Administered 2019-03-22: 1100 [IU]/h via INTRAVENOUS
  Filled 2019-03-22: qty 250

## 2019-03-22 MED ORDER — SEVELAMER CARBONATE 800 MG PO TABS
3200.0000 mg | ORAL_TABLET | Freq: Three times a day (TID) | ORAL | Status: DC
  Administered 2019-03-22 – 2019-03-24 (×5): 3200 mg via ORAL
  Filled 2019-03-22 (×5): qty 4

## 2019-03-22 MED ORDER — DULOXETINE HCL 60 MG PO CPEP
60.0000 mg | ORAL_CAPSULE | Freq: Two times a day (BID) | ORAL | Status: DC
  Administered 2019-03-22 – 2019-03-24 (×5): 60 mg via ORAL
  Filled 2019-03-22 (×5): qty 1

## 2019-03-22 MED ORDER — HEPARIN 1000 UNIT/ML FOR PERITONEAL DIALYSIS: 500.0000 [IU] | INTRAMUSCULAR | Status: DC | PRN

## 2019-03-22 SURGICAL SUPPLY — 11 items
CATH INFINITI 5FR MULTPACK ANG (CATHETERS) ×1 IMPLANT
CLOSURE MYNX CONTROL 5F (Vascular Products) ×1 IMPLANT
HOVERMATT SINGLE USE (MISCELLANEOUS) ×2 IMPLANT
KIT HEART LEFT (KITS) ×2 IMPLANT
KIT MICROPUNCTURE NIT STIFF (SHEATH) ×1 IMPLANT
PACK CARDIAC CATHETERIZATION (CUSTOM PROCEDURE TRAY) ×2 IMPLANT
SHEATH PINNACLE 5F 10CM (SHEATH) ×1 IMPLANT
SHEATH PROBE COVER 6X72 (BAG) ×1 IMPLANT
TRANSDUCER W/STOPCOCK (MISCELLANEOUS) ×2 IMPLANT
TUBING CIL FLEX 10 FLL-RA (TUBING) ×2 IMPLANT
WIRE EMERALD 3MM-J .035X150CM (WIRE) ×1 IMPLANT

## 2019-03-22 NOTE — Progress Notes (Signed)
Progress Note    Max Pittman  [id number] DOB: 06-22-1975  DOA: 03/21/2019 PCP: Chesley Noon, MD    Brief Narrative:    Medical records reviewed and are as summarized below:  Max Pittman is an 44 y.o. male referred from antidepressant with chest pain and episode of passing out.  Patient's cardiac enzymes are markedly elevated.  Cardiology consult appreciated.  Patient is also on peritoneal dialysis so nephrology has also been consulted.  Assessment/Plan:   Principal Problem:   Syncope Active Problems:   Chest pain   Peritoneal dialysis status (HCC)   Stroke (HCC)   Diabetes mellitus ([place])   Chest pain and syncope- non-STEMI -troponin markedly elevated  -EKG with diffuse ST and T wave abnormalities. -Cardiology consult appreciated -Obtain echocardiogram -N.p.o. midnight, pending cardiology evaluation -Lipid panel:  LDL 53 -Continue heparin drip  ESRD on Peritoneal dialysis status -Nephrology consult appreciated  Thrombocytopenia-platelets 101.  Baseline platelets per care everywhere from 2019- 150s to 200s.  Denies alcohol use.  Diabetes mellitus-random glucose 158 - SSI q6h while n.p.o.  Hypertension -Hold home carvedilol, hydralazine  History of stroke -reports very mild residual left-sided weakness but is able to ambulate without assistance.  obesity Body mass index is 42.83 kg/m.   Family Communication/Anticipated D/C date and plan/Code Status   DVT prophylaxis: Heparin drip Code Status: Full Code.  Family Communication:  Disposition Plan: Pending recommendations by cardiology, questionable heart catheterization   Medical Consultants:    Cardiology  Nephrology     Subjective:   Describes chest pain as constant  Objective:    Vitals:   03/22/19 0528 03/22/19 0534 03/22/19 0544 03/22/19 0952  BP:  102/86  112/75  Pulse: 95 (!) 102  98  Resp:    18  Temp:   98.2 F (36.8 C) 97.6 F (36.4 C)  TempSrc:   Oral   SpO2:  100%   97%  Weight:      Height:        Intake/Output Summary (Last 24 hours) at 03/22/2019 1100 Last data filed at 03/22/2019 0355 Gross per 24 hour  Intake 550 ml  Output --  Net 550 ml   Filed Weights   03/22/19 0222  Weight: 131.5 kg    Exam: In bed, no acute distress Mucous membranes moist No wheezing, no increased work of breathing Regular rate and rhythm Moves all 4 extremities Alert and oriented x3   Data Reviewed:   I have personally reviewed following labs and imaging studies:  Labs: Labs show the following:   Basic Metabolic Panel: Recent Labs  Lab 03/21/19 1815  NA 134*  K 3.0*  CL 92*  CO2 24  GLUCOSE 158*  BUN 58*  CREATININE 15.03*  CALCIUM 10.2  MG 1.8   GFR Estimated Creatinine Clearance: 8.6 mL/min (A) (by C-G formula based on SCr of 15.03 mg/dL (H)). Liver Function Tests: No results for input(s): AST, ALT, ALKPHOS, BILITOT, PROT, ALBUMIN in the last 168 hours. No results for input(s): LIPASE, AMYLASE in the last 168 hours. No results for input(s): AMMONIA in the last 168 hours. Coagulation profile No results for input(s): INR, PROTIME in the last 168 hours.  CBC: Recent Labs  Lab 03/21/19 1815  WBC 10.2  NEUTROABS 6.3  HGB 18.8*  HCT 57.5*  MCV 91.4  PLT 101*   Cardiac Enzymes: No results for input(s): CKTOTAL, CKMB, CKMBINDEX, TROPONINI in the last 168 hours. BNP (last 3 results) No results for input(s): PROBNP in the  last 8760 hours. CBG: No results for input(s): GLUCAP in the last 168 hours. D-Dimer: No results for input(s): DDIMER in the last 72 hours. Hgb A1c: Recent Labs    03/21/19 1815  HGBA1C 6.6*   Lipid Profile: Recent Labs    03/22/19 0741  CHOL 115  HDL 29*  LDLCALC 53  TRIG 165*  CHOLHDL 4.0   Thyroid function studies: No results for input(s): TSH, T4TOTAL, T3FREE, THYROIDAB in the last 72 hours.  Invalid input(s): FREET3 Anemia work up: No results for input(s): VITAMINB12, FOLATE, FERRITIN,  TIBC, IRON, RETICCTPCT in the last 72 hours. Sepsis Labs: Recent Labs  Lab 03/21/19 1815  WBC 10.2    Microbiology Recent Results (from the past 240 hour(s))  Respiratory Panel by RT PCR (Flu A&B, Covid) - Nasopharyngeal Swab     Status: None   Collection Time: 03/21/19  8:51 PM   Specimen: Nasopharyngeal Swab  Result Value Ref Range Status   SARS Coronavirus 2 by RT PCR NEGATIVE NEGATIVE Final    Comment: (NOTE) SARS-CoV-2 target nucleic acids are NOT DETECTED. The SARS-CoV-2 RNA is generally detectable in upper respiratoy specimens during the acute phase of infection. The lowest concentration of SARS-CoV-2 viral copies this assay can detect is 131 copies/mL. A negative result does not preclude SARS-Cov-2 infection and should not be used as the sole basis for treatment or other patient management decisions. A negative result may occur with  improper specimen collection/handling, submission of specimen other than nasopharyngeal swab, presence of viral mutation(s) within the areas targeted by this assay, and inadequate number of viral copies (<131 copies/mL). A negative result must be combined with clinical observations, patient history, and epidemiological information. The expected result is Negative. Fact Sheet for Patients:  PinkCheek.be Fact Sheet for Healthcare Providers:  GravelBags.it This test is not yet ap proved or cleared by the [place] FDA and  has been authorized for detection and/or diagnosis of SARS-CoV-2 by FDA under an Emergency Use Authorization (EUA). This EUA will remain  in effect (meaning this test can be used) for the duration of the COVID-19 declaration under Section 564(b)(1) of the Act, 21 U.S.C. section 360bbb-3(b)(1), unless the authorization is terminated or revoked sooner.    Influenza A by PCR NEGATIVE NEGATIVE Final   Influenza B by PCR NEGATIVE NEGATIVE Final    Comment:  (NOTE) The Xpert Xpress SARS-CoV-2/FLU/RSV assay is intended as an aid in  the diagnosis of influenza from Nasopharyngeal swab specimens and  should not be used as a sole basis for treatment. Nasal washings and  aspirates are unacceptable for Xpert Xpress SARS-CoV-2/FLU/RSV  testing. Fact Sheet for Patients: PinkCheek.be Fact Sheet for Healthcare Providers: GravelBags.it This test is not yet approved or cleared by the [place] FDA and  has been authorized for detection and/or diagnosis of SARS-CoV-2 by  FDA under an Emergency Use Authorization (EUA). This EUA will remain  in effect (meaning this test can be used) for the duration of the  Covid-19 declaration under Section 564(b)(1) of the Act, 21  U.S.C. section 360bbb-3(b)(1), unless the authorization is  terminated or revoked. Performed at [facility], [address], [place], [place] [address]     Procedures and diagnostic studies:  DG Chest [facility] 1 View  Result Date: 03/21/2019 CLINICAL DATA:  44 year old male with chest pain. EXAM: PORTABLE CHEST 1 VIEW COMPARISON:  None. FINDINGS: The heart size and mediastinal contours are within normal limits. Both lungs are clear. The visualized skeletal structures are unremarkable. IMPRESSION:  No active disease. Electronically Signed   By: Anner Crete M.D.   On: 03/21/2019 19:16    Medications:   . aspirin  325 mg Oral Once  . atorvastatin  40 mg Oral q1800  . buPROPion  150 mg Oral BID  . DULoxetine  60 mg Oral BID  . famotidine  20 mg Oral BID  . [START ON 03/23/2019] pneumococcal 23 valent vaccine  0.5 mL Intramuscular Tomorrow-1000  . pregabalin  100 mg Oral TID  . sevelamer carbonate  3,200 mg Oral TID WC   Continuous Infusions: . heparin 1,100 Units/hr (03/22/19 0244)     LOS: 0 days   Geradine Girt  [facility]   How to contact the [facility] Attending or Consulting provider [place] or covering provider  during after hours [place], for this patient?  1. Check the care team in [facility] and look for a) attending/consulting [facility] provider listed and b) the [facility] team listed 2. Log into [email or web address] and use [place]'s universal password to access. If you do not have the password, please contact the hospital operator. 3. Locate the [facility] provider you are looking for under [facility] and page to a number that you can be directly reached. 4. If you still have difficulty reaching the provider, please page the [facility] (Director on Call) for the Hospitalists listed on amion for assistance.  03/22/2019, 11:00 AM

## 2019-03-22 NOTE — Consult Note (Signed)
Renal Service Consult Note [facility]  Kennth Demory 03/22/2019 [name] Requesting Physician:  Dr Eliseo Squires  Reason for Consult:  ESRD pt on PD w/ acute NSTEMI HPI: The patient is a 44 y.o. year-old with hx of DM2, HTN, CVA and ESRD on PD presented w/ episodic syncope over last 5 days. Also c/o CP. Trop's were positive. Pt was admitted and went for heart cath today which showed no sig CAD.  Asked to see for PD.    Pt on PD x 1 yr, f/b [facility].  Has no c/o any edema, SOB or abd pain at this time.  Could not reach PD nurse in [place], gone for the day.    ROS  denies CP  no joint pain   no HA  no blurry vision  no rash  no diarrhea  no nausea/ vomiting     Past Medical History  Past Medical History:  Diagnosis Date  . Arthritis   . Diabetes ([place])   . History of peritoneal dialysis   . Hypertension   . Renal disorder    FFGS  . Stroke [facility])    when ha was a child  . Vasovagal syndrome    with syncope   Past Surgical History  Past Surgical History:  Procedure Laterality Date  . AV FISTULA INSERTION W/ RF MAGNETIC GUIDANCE Left   . CHOLECYSTECTOMY    . HERNIA REPAIR    . TONSILLECTOMY     Family History  Family History  Problem Relation Age of Onset  . CAD Mother        "angina"  . Hypertension Mother   . Diabetes Mother   . Hypercholesterolemia Mother   . Diabetes Father   . Hypertension Father   . Hypercholesterolemia Father   . Healthy Brother    Social History  reports that he quit smoking about 4 months ago. He has quit using smokeless tobacco. He reports that he does not use drugs. No history on file for alcohol. Allergies  Allergies  Allergen Reactions  . Doxycycline Hives  . Latex Swelling    Pt reports swelling at site.   . Morphine And Related Anxiety   Home medications Prior to Admission medications   Medication Sig Start Date End Date Taking? Authorizing Provider  atorvastatin (LIPITOR) 20 MG tablet Take 20  mg by mouth daily. 01/12/19  Yes [provider]  buPROPion (WELLBUTRIN SR) 150 MG 12 hr tablet Take 150 mg by mouth 2 (two) times daily. 01/20/19  Yes [provider]  DULoxetine (CYMBALTA) 60 MG capsule Take 60 mg by mouth 2 (two) times daily. 01/11/19  Yes [provider]  furosemide (LASIX) 40 MG tablet Take 120 mg by mouth in the morning and at bedtime.   Yes [provider]  gentamicin cream (GARAMYCIN) 0.1 % Apply 1 application topically every other day. Applied to catheter post cleaning   Yes [provider]  HYDROcodone-acetaminophen (NORCO/VICODIN) 5-325 MG tablet Take 1 tablet by mouth every 8 (eight) hours as needed for moderate pain.  02/20/19  Yes [provider]  hydrOXYzine (ATARAX/VISTARIL) 25 MG tablet Take 25 mg by mouth every 8 (eight) hours as needed for itching.  01/21/19  Yes [provider]  Insulin Glargine (BASAGLAR KWIKPEN) 100 UNIT/ML SOPN Inject 22 Units into the skin every evening.  03/10/19  Yes [provider]  pioglitazone (ACTOS) 45 MG tablet Take 45 mg by mouth daily. 01/21/19  Yes [provider]  pregabalin (LYRICA) 100 MG capsule Take 100 mg by mouth 3 (three) times daily. 02/20/19  Yes [provider]  RENVELA 800 MG tablet Take 1,600-3,200 mg by mouth See admin instructions. Take 4 tablets (3200mg  total)  by mouth with meals three times daily and take 2 tablets (1600mg  total) with snacks 12/07/18  Yes [provider]  tiZANidine (ZANAFLEX) 4 MG tablet Take 4 mg by mouth 3 (three) times daily. 03/02/19  Yes [provider]  TRULICITY 1.5 0000000 SOPN Inject 1.5 mg into the skin every Tuesday.  03/10/19  Yes [provider]  carvedilol (COREG) 25 MG tablet Take 25 mg by mouth 2 (two) times daily. 01/12/19   [provider]  hydrALAZINE (APRESOLINE) 25 MG tablet Take 25 mg by mouth 3 (three) times daily. 01/13/19   [provider]      Vitals:   03/22/19 [id number] 03/22/19 1347 03/22/19 1440 03/22/19 1522  BP: 112/75  119/90 121/78  Pulse: 98  95 91  Resp: 18  14 18   Temp: 97.6 F (36.4 C)   97.6 F (36.4 C)  TempSrc:    Oral  SpO2: 97% 100% 97% 98%  Weight:      Height:       Exam Gen obese WM no distress No rash, cyanosis or gangrene Sclera anicteric, throat clear  No jvd or bruits Chest clear bilat to bases RRR no MRG Abd soft ntnd no mass or ascites +bs , PD cath LLQ GU normal male  MS no joint effusions or deformity Ext 1+ LE edema, no wounds or ulcers Neuro is alert, Ox 3 , nf    Home meds:  - furosemide 120 bid/ carvedilol 25 bid/ hydralazine 25 tid  - turlicity 1.5 weekly/ pioglitazone 45 qd  - bupropion sr 150 bid/ duloxetine 60 bid/ hydrocodone prn/ pregablin 100 tid/ tizanidine 4mg  tid  - atorvastatin 20  - prn's/ vitamins/ supplements     Outpt HD: [facility]  Hangs 18L at night and does 3L day bag, 9.5 hr overnight , 5 exchanges, not sure dwell vol     Assessment/ Plan: 1. Chest pain / syncope/ Cleora Fleet - sp LHC , no sig CAD by cath 2. ESRD - on [facility] x 1 yr, will plan PD tonight, min UF all 1.5% given syncopal episodes. CXR clear.  3. HTN/vol - holding home coreg/ hydralazine. BP's low normal. Will give 1000 cc NS bolus tonight. May need to drop some of his antiHTN'sives.  High Hb may signal hemoconcentration from dehydration.  4. DM2 - per primary 5. Anemia ckd - Hb 18, no esa needed.   [name]  MD 03/22/2019, 3:50 PM  Recent Labs  Lab 03/21/19 1815  WBC 10.2  HGB 18.8*   Recent Labs  Lab 03/21/19 1815 03/22/19 0741  K 3.0* 3.0*  BUN 58* 61*  CREATININE 15.03* 15.73*  CALCIUM 10.2 9.8

## 2019-03-22 NOTE — Progress Notes (Signed)
TRH night shift.  I have discussed the new troponin level result with Dr. Kalman Shan, who is on call for cardiology tonight. Given the doubling of the level since the previous measurement about 4 hrs 40 minutes ago we will have to start heparin infusion for NSTEMI. Troponin level will be checked every six hours with the next level to be drawn at 0700. I have also optimized electrolyte replacement to decreased risk of cardiac arrhythmias.   Tennis Must, MD

## 2019-03-22 NOTE — Interval H&P Note (Signed)
Cath Lab Visit (complete for each Cath Lab visit)  Clinical Evaluation Leading to the Procedure:   ACS: Yes.    Non-ACS:  n/a   History and Physical Interval Note:  03/22/2019 1:47 PM  Max Pittman  has presented today for surgery, with the diagnosis of nonstemi.  The various methods of treatment have been discussed with the patient and family. After consideration of risks, benefits and other options for treatment, the patient has consented to  Procedure(s): LEFT HEART CATH AND CORONARY ANGIOGRAPHY (N/A) as a surgical intervention.  The patient's history has been reviewed, patient examined, no change in status, stable for surgery.  I have reviewed the patient's chart and labs.  Questions were answered to the patient's satisfaction.     Kathlyn [place]

## 2019-03-22 NOTE — H&P (View-Only) (Signed)
Cardiology Consultation:   Patient ID: Max Pittman MRN: [id number]; DOB: 12-Dec-1975  Admit date: 03/21/2019 Date of Consult: 03/22/2019  Primary Care Provider: Chesley Noon, MD Primary Cardiologist: No primary care provider on file.  Primary Electrophysiologist:  None    Patient Profile:   Max Pittman is a 44 y.o. 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 Eliseo Squires.  History of Present Illness:   Max Pittman is a 44 y.o. male with a hx of ESRD on peritoneal dialysis, stroke at age 82, hypertension, diabetes mellitus and vasovagal syncope.  Per review of internal medicine notes, the patient reported syncopal episodes up to 15 times a day over the past 5 days.  He also had lightheadedness and noted some low blood pressures.  The patient has a history of echocardiogram in 2016 that showed normal EF.  He also had a low risk Myoview in 02/2016.  Done at [facility].  On my interview of the patient he tells me that he lives with his girlfriend.  He has not been very active in about the last 2 years.  He denies any prior cardiac history.  He is in the process of being worked up for a renal transplant at [facility] but has not yet had any cardiac testing.  He is a prior smoking of about 1.5 packs/day for about 20 years, having quit about 2.5 years ago.  He was vaping until about 6 months ago.  He rarely drinks alcohol.  He has had pressure across his lower chest "like a ton of bricks" that has been constant since Sunday morning and is still present without any change in degree.  He also has mild dyspnea on exertion, no dyspnea at rest.  He is lying almost flat with no shortness of breath but some mild pressure in his throat area.  He has not had any edema.   Patient tells me that he was diagnosed with vasovagal syncope several years ago.   This usually happens when he is using the bathroom and seems to occur 2-3 times per year.  He says that he passed out Saturday night after getting up to use the bathroom in the middle of the night.  He had not yet sat on the toilet when he lost consciousness and fell.  He was lightheaded prior to syncope but denies any feeling of palpitations or fast heartbeat.  He thinks that he was out for about 20 minutes according to what his girlfriend told him. He has had several milder episodes since that time and continues to feel intermittently dizzy while here.  Blood pressures have been known to be low intermittently and have been low over the last 2 weeks and his blood pressure medicines have been held.  He does peritoneal dialysis every night while he is sleeping and 1 exchange during the day. He takes lasix 120 mg BID at home but puts our very little urine, "drops here and there".   Heart Pathway Score:     Past Medical History:  Diagnosis Date  . Arthritis   . Diabetes ([place])   . History of peritoneal dialysis   . Hypertension   . Renal disorder    FFGS  . Stroke [facility])    when ha was a child  . Vasovagal syndrome    with syncope  Past Surgical History:  Procedure Laterality Date  . AV FISTULA INSERTION W/ RF MAGNETIC GUIDANCE Left   . CHOLECYSTECTOMY    . HERNIA REPAIR    . TONSILLECTOMY       Home Medications:  Prior to Admission medications   Medication Sig Start Date End Date Taking? Authorizing Provider  atorvastatin (LIPITOR) 20 MG tablet Take 20 mg by mouth daily. 01/12/19  Yes [provider]  buPROPion (WELLBUTRIN SR) 150 MG 12 hr tablet Take 150 mg by mouth 2 (two) times daily. 01/20/19  Yes [provider]  DULoxetine (CYMBALTA) 60 MG capsule Take 60 mg by mouth 2 (two) times daily. 01/11/19  Yes [provider]  furosemide (LASIX) 40 MG tablet Take 120 mg by mouth in the morning and at bedtime.   Yes [provider]  gentamicin cream  (GARAMYCIN) 0.1 % Apply 1 application topically every other day. Applied to catheter post cleaning   Yes [provider]  HYDROcodone-acetaminophen (NORCO/VICODIN) 5-325 MG tablet Take 1 tablet by mouth every 8 (eight) hours as needed for moderate pain.  02/20/19  Yes [provider]  hydrOXYzine (ATARAX/VISTARIL) 25 MG tablet Take 25 mg by mouth every 8 (eight) hours as needed for itching.  01/21/19  Yes [provider]  Insulin Glargine (BASAGLAR KWIKPEN) 100 UNIT/ML SOPN Inject 22 Units into the skin every evening.  03/10/19  Yes [provider]  pioglitazone (ACTOS) 45 MG tablet Take 45 mg by mouth daily. 01/21/19  Yes [provider]  pregabalin (LYRICA) 100 MG capsule Take 100 mg by mouth 3 (three) times daily. 02/20/19  Yes [provider]  RENVELA 800 MG tablet Take 1,600-3,200 mg by mouth See admin instructions. Take 4 tablets (3200mg  total)  by mouth with meals three times daily and take 2 tablets (1600mg  total) with snacks 12/07/18  Yes [provider]  tiZANidine (ZANAFLEX) 4 MG tablet Take 4 mg by mouth 3 (three) times daily. 03/02/19  Yes [provider]  TRULICITY 1.5 0000000 SOPN Inject 1.5 mg into the skin every Tuesday.  03/10/19  Yes [provider]  carvedilol (COREG) 25 MG tablet Take 25 mg by mouth 2 (two) times daily. 01/12/19   [provider]  hydrALAZINE (APRESOLINE) 25 MG tablet Take 25 mg by mouth 3 (three) times daily. 01/13/19   [provider]    Inpatient Medications: Scheduled Meds: . aspirin  325 mg Oral Once  . atorvastatin  40 mg Oral q1800  . buPROPion  150 mg Oral BID  . DULoxetine  60 mg Oral BID  . famotidine  20 mg Oral BID  . [START ON 03/23/2019] pneumococcal 23 valent vaccine  0.5 mL Intramuscular Tomorrow-1000  . pregabalin  100 mg Oral TID  . sevelamer carbonate  3,200 mg Oral TID WC   Continuous Infusions: . heparin 1,100 Units/hr (03/22/19 0244)   PRN  Meds: acetaminophen, nitroGLYCERIN, ondansetron (ZOFRAN) IV, sevelamer carbonate  Allergies:    Allergies  Allergen Reactions  . Doxycycline Hives  . Latex Swelling    Pt reports swelling at site.   . Morphine And Related Anxiety    Social History:   Social History   Socioeconomic History  . Marital status: Legally Separated    Spouse name: Not on file  . Number of children: Not on file  . Years of education: Not on file  . Highest education level: Not on file  Occupational History  . Not on file  Tobacco Use  .  Smoking status: Former Smoker    Quit date: 11/16/2018    Years since quitting: 0.3  . Smokeless tobacco: Former Network engineer and Sexual Activity  . Alcohol use: Not on file    Comment: occ  . Drug use: Never  . Sexual activity: Not on file  Other Topics Concern  . Not on file  Social History Narrative  . Not on file   Social Determinants of Health   Financial Resource Strain:   . Difficulty of Paying Living Expenses: Not on file  Food Insecurity:   . Worried About Charity fundraiser in the Last Year: Not on file  . Ran Out of Food in the Last Year: Not on file  Transportation Needs:   . Lack of Transportation (Medical): Not on file  . Lack of Transportation (Non-Medical): Not on file  Physical Activity:   . Days of Exercise per Week: Not on file  . Minutes of Exercise per Session: Not on file  Stress:   . Feeling of Stress : Not on file  Social Connections:   . Frequency of Communication with Friends and Family: Not on file  . Frequency of Social Gatherings with Friends and Family: Not on file  . Attends Religious Services: Not on file  . Active Member of Clubs or Organizations: Not on file  . Attends Archivist Meetings: Not on file  . Marital Status: Not on file  Intimate Partner Violence:   . Fear of Current or Ex-Partner: Not on file  . Emotionally Abused: Not on file  . Physically Abused: Not on file  . Sexually Abused: Not  on file    Family History:    Family History  Problem Relation Age of Onset  . CAD Mother        "angina"  . Hypertension Mother   . Diabetes Mother   . Hypercholesterolemia Mother   . Diabetes Father   . Hypertension Father   . Hypercholesterolemia Father   . Healthy Brother      ROS:  Please see the history of present illness.   All other ROS reviewed and negative.     Physical Exam/Data:   Vitals:   03/22/19 0528 03/22/19 0534 03/22/19 0544 03/22/19 0952  BP:  102/86  112/75  Pulse: 95 (!) 102  98  Resp:    18  Temp:   98.2 F (36.8 C) 97.6 F (36.4 C)  TempSrc:   Oral   SpO2: 100%   97%  Weight:      Height:        Intake/Output Summary (Last 24 hours) at 03/22/2019 1050 Last data filed at 03/22/2019 0355 Gross per 24 hour  Intake 550 ml  Output --  Net 550 ml   Last 3 Weights 03/22/2019  Weight (lbs) 290 lb  Weight (kg) 131.543 kg     Body mass index is 42.83 kg/m.  General:  Well nourished, well developed, in no acute distress HEENT: normal Lymph: no adenopathy Neck: no JVD Endocrine:  No thryomegaly Vascular: No carotid bruits; pedal pulses 1+ bilaterally  Cardiac:  normal S1, S2; RRR; no murmur  Lungs:  clear to auscultation bilaterally, no wheezing, rhonchi or rales  Abd: soft, nontender, no hepatomegaly  Ext: no edema Musculoskeletal:  No deformities, BUE and BLE strength normal and equal Skin: warm and dry  Neuro:  CNs 2-12 intact, no focal abnormalities noted Psych:  Normal affect   EKG:  The EKG was personally reviewed  and demonstrates:  NSR, 96 bpm, early upstroke of the T waves diffusely, abnormal r wave progression, possible old anterior MI Telemetry:  Telemetry was personally reviewed and demonstrates:  NSR   Relevant CV Studies:  Myocardial perfusion exercise stress test 03/14/2016 CONCLUSIONS:  1. No significant symptoms or ECG changes after exercise.   2. Normal myocardial perfusion study without evidence of inducible  ischemia.   3. Normal left ventricular wall motion and segmental function, LVEF 68%.   4. The study suggests a low risk.  Echocardiogram 12/16/2014 Findings  Mitral Valve  The mitral valve appears structurally normal.  There is no evidence of mitral regurgitaton.  Aortic Valve  There is a normal appearing trileaflet aortic valve.  There is no significant aortic stenosis or insufficiency.  Tricuspid Valve  The tricuspid valve appears normal.  No evidence of tricuspid regurgitation.  Pulmonic Valve  The pulmonic valve is not well visualized.  No pulmonary regurgitation is seen.  Left Atrium  The left atrial size is normal.  Left Ventricle  The estimated left ventricular ejection fraction is 55 - 60%.  There is mild to moderate concentric left ventricular hypertrophy seen.  Right Atrium  Right atrial size is normal.  Right Ventricle  There is normal right ventricular size and function.  Pericardium  No pericardial effusion or thickening is seen.  Interatrial Septum  The interatrial septum appears intact, without evidence for patent foramen  ovale.  Miscellaneous  The aortic root is normal in size.  The inferior vena cava is normal in size and demonstrates normal  inspiratory collapse.  Conclusions  Summary  The estimated left ventricular ejection fraction is 55 - 60%.  There is mild to moderate concentric left ventricular hypertrophy seen.  No significant valvular dysfunction.  Laboratory Data:  High Sensitivity Troponin:   Recent Labs  Lab 03/21/19 1815 03/21/19 2029 03/22/19 0106 03/22/19 0741  TROPONINIHS 682* 939* 1,997* 3,490*     Chemistry Recent Labs  Lab 03/21/19 1815  NA 134*  K 3.0*  CL 92*  CO2 24  GLUCOSE 158*  BUN 58*  CREATININE 15.03*  CALCIUM 10.2  GFRNONAA 3*  GFRAA 4*  ANIONGAP 18*    No results for input(s): PROT, ALBUMIN, AST, ALT, ALKPHOS, BILITOT in the last 168 hours. Hematology Recent Labs  Lab 03/21/19 1815  WBC 10.2   RBC 6.29*  HGB 18.8*  HCT 57.5*  MCV 91.4  MCH 29.9  MCHC 32.7  RDW 18.0*  PLT 101*   BNPNo results for input(s): BNP, PROBNP in the last 168 hours.  DDimer No results for input(s): DDIMER in the last 168 hours.   Radiology/Studies:  DG Chest Port 1 View  Result Date: 03/21/2019 CLINICAL DATA:  44 year old male with chest pain. EXAM: PORTABLE CHEST 1 VIEW COMPARISON:  None. FINDINGS: The heart size and mediastinal contours are within normal limits. Both lungs are clear. The visualized skeletal structures are unremarkable. IMPRESSION: No active disease. Electronically Signed   By: Anner Crete M.D.   On: 03/21/2019 19:16      Assessment and Plan:   NSTEMI -Patient presented with multiple episodes of syncope over the last several days (hx of vasovagal syncope and hypotension) and chest pain that began on Sunday as pressure across the lower chest and has been constant since. Also mild DOE.  -CVD risk factors include hypertension, diabetes, hyperlipidemia, obesity. -Troponins have trended up 682>939>1997>3491. -EKG showed sinus tachycardia, diffuse ST changes, abnormal r wave progression, no STEMI.  Will repeat EKG  for progression. -IV heparin is infusing for ACS. -Chest x-ray with no active disease. -Echocardiogram has been ordered to assess LV function and wall motion. -Patient is n.p.o.  Plan for cardiac catheterization today with possible PCI.   History vasovagal syncope -Patient notes that he usually has syncope with using the bathroom, a couple of times a year -Patient had reported syncope Saturday night after getting up to use the bathroom in the middle of the night.  He had not yet sat on the toilet when he lost consciousness and fell.  He was lightheaded prior to syncope but denies any feeling of palpitations or fast heartbeat.  He has had several milder episodes since that time and continues to feel intermittently dizzy while here.  Blood pressures have been known to be  low over the last 2 weeks and his blood pressure medicines have been held. -No arrhythmias so far on telemetry.   Hypokalemia -K+ 3.0 on presentation. Pt was given supplemental potassium.  We will check a follow-up metabolic panel.  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 I spoke to Dr. Jonnie Finner and informed him that the pt will have cath today. He will arrange for PD tonight.   Thrombocytopenia -Platelets 101.  Hemoglobin is elevated at 18.8.  Hypertension -Patient was hypotensive on presentation down to 85/41.  Home carvedilol and hydralazine have been held for about the last 2 weeks due to low BP.  -Blood pressure is currently improved, 112/75.  Diabetes type 2 on insulin -A1c 6.6, adequate control. -Management Per primary team -Home Lantus, Actos and Trulicity on hold. -Patient is currently on sliding scale insulin.  History of stroke -Reportedly at age 37 with mild residual left-sided weakness.  Hyperlipidemia -On atorvastatin 20 mg daily at home -Patient has been ordered Lipitor 40 mg daily while here. -LDL is 53.  Pending cath results would set his goal at less than 70.      For questions or updates, please contact [place] Please consult [email or web address] for contact info under     Signed, Daune Perch, NP  03/22/2019 10:50 AM   ATTENDING ATTESTATION  I have seen, examined and evaluated the patient this AM along with Ms. Hammond-NP.  After reviewing all the available data and chart, we discussed the patients laboratory, study & physical findings as well as symptoms in detail. I agree with her findings, examination as well as impression recommendations as per our discussion.    Max Pittman is a very pleasant 44 year old gentleman who has been on renal dialysis for ESRD secondary to FSGS for about 2 years now.  He also has hypertension hyperlipidemia and diabetes, history of smoking with a distant history of childhood  stroke.  He now presents via transfer from [facility] after presenting with chest pain and worsening shortness of breath is been present for the last couple days.  He is ruled in for non-ST elevation MI with troponin elevation.  Currently still has minimal residual chest pain but nothing significant.  He is on IV heparin.  EKG shows very poor R wave progression with anteroseptal Q waves suggestive of likely old anterior septal MI. Exam is relatively benign besides being morbidly obese.  Heart sounds are distant.  No obvious M/R/G.  Because of orthostatic hypotension, he is no longer on hydralazine and carvedilol because of significant hypotension with history of orthostatic vasovagal syncope.  We will therefore hold off on any blood pressure medications.  Given his risk factors  and positive troponin, must consider his subacute presentation is a non-ST elevation MI.  He has also yet to receive PD which will hopefully be tonight.  Plan: Agree with consideration for urgent Catheterization and Possible PCI. Following cath, he will be transferred to the cardiology floor for post-cath care.   Performing MD:  Glenetta Hew, M.D., M.S.  Procedure: LEFT HEART CATHETERIZATION WITH CORONARY ANGIOGRAPHY AND POSSIBLE PERCUTANEOUS CORONARY INTERVENTION  The procedure with Risks/Benefits/Alternatives and Indications was reviewed with the patient.  All questions were answered.    Risks / Complications include, but not limited to: Death, MI, CVA/TIA, VF/VT (with defibrillation), Bradycardia (need for temporary pacer placement), bleeding / bruising / hematoma / pseudoaneurysm, vascular or coronary injury (with possible emergent CT or Vascular Surgery), adverse medication reactions, infection.  Additional risks involving the use of radiation with the possibility of radiation burns and cancer were explained in detail.  The patient voices understanding and agrees` to proceed.    More plans to follow-up  after cardiac catheterization and possible PCI.   Glenetta Hew, M.D., M.S. Interventional Cardiologist   Pager # [phone] Phone # [phone] [address] [place], [place] [address]

## 2019-03-22 NOTE — Progress Notes (Signed)
Post pt's cath lab procedure RN called 3E and gave report to [place] RN about pt's history and baseline pre-procedure. RN reported off about pt's peritoneal dialysis and possible need to follow up with nephrologist since pt usually receives dialysis overnight (and hooks a bag up to gravity to drain at 1500).   RN delivered pt's belongings to his new assigned room.

## 2019-03-22 NOTE — Progress Notes (Signed)
CRITICAL VALUE ALERT  Critical Value:  Troponin 3490  Date & Time Notied:  03/22/19 0857  Provider Notified: Roby Lofts PA  Orders Received/Actions taken: EKG and PRN Nitroglycerin. Cardiology plan to follow up on pt this morning. PA asked for pt to be kept NPO. RN will continue to monitor pt.

## 2019-03-22 NOTE — Progress Notes (Signed)
ANTICOAGULATION CONSULT NOTE - Initial Consult  Pharmacy Consult for heparin Indication: chest pain/ACS  Allergies  Allergen Reactions  . Doxycycline Hives  . Latex Swelling    Pt reports swelling at site.   . Morphine And Related Anxiety    Patient Measurements: Height: 5\' 9"  (175.3 cm) Weight: 290 lb (131.5 kg) IBW/kg (Calculated) : 70.7 Heparin Dosing Weight: 101.3  Vital Signs: BP: 102/72 (02/23 2100) Pulse Rate: 102 (02/24 0059)  Labs: Recent Labs    03/21/19 1815 03/21/19 2029 03/22/19 0106  HGB 18.8*  --   --   HCT 57.5*  --   --   PLT 101*  --   --   CREATININE 15.03*  --   --   TROPONINIHS 682* 939* 1,997*    Estimated Creatinine Clearance: 8.6 mL/min (A) (by C-G formula based on SCr of 15.03 mg/dL (H)).   Medical History: Past Medical History:  Diagnosis Date  . Arthritis   . Diabetes ([place])   . History of peritoneal dialysis   . Hypertension   . Renal disorder    FFGS  . Stroke [facility])    when ha was a child  . Vasovagal syndrome    with syncope    Medications:  See medication history  Assessment: 44 yo man to start heparin for r/o ACS.  His PTLC is low.  He was not on anticoagulation PTA. Due to low PTLC, will aim for lower goal and decrease bolus dose Goal of Therapy:  Heparin level 0.3-0.5 units/ml Monitor platelets by anticoagulation protocol: Yes   Plan:  Heparin bolus 2500 units and drip at 1100 units/hr Check heparin level 6-8 hours after start Daily heparin level and CBC while on heparin Monitor for bleeding complications  Thanks for allowing pharmacy to be a part of this patient's care.  Excell Seltzer, PharmD Clinical Pharmacist 03/22/2019,2:23 AM

## 2019-03-22 NOTE — ED Notes (Signed)
Report given to [facility] on 2W

## 2019-03-22 NOTE — ED Notes (Signed)
Report given to Max Pittman with [facility] 

## 2019-03-22 NOTE — Plan of Care (Signed)

## 2019-03-22 NOTE — Progress Notes (Signed)
Patient ID: Max Pittman, male   DOB: Oct 26, 1975, 45 y.o.   MRN: [id number] Echocardiogram with definity is complete.  Darlina Sicilian [facility]  03/22/2019 12:57 pm

## 2019-03-22 NOTE — Consult Note (Addendum)
Cardiology Consultation:   Patient ID: Max Pittman MRN: [id number]; DOB: [birth year]  Admit date: 03/21/2019 Date of Consult: 03/22/2019  Primary Care Provider: Chesley Noon, MD Primary Cardiologist: No primary care provider on file.  Primary Electrophysiologist:  None    Patient Profile:   Max Pittman is a 43 y.o. 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 Eliseo Squires.  History of Present Illness:   Max Pittman is a 44 y.o. male with a hx of ESRD on peritoneal dialysis, stroke at age 65, hypertension, diabetes mellitus and vasovagal syncope.  Per review of internal medicine notes, the patient reported syncopal episodes up to 15 times a day over the past 5 days.  He also had lightheadedness and noted some low blood pressures.  The patient has a history of echocardiogram in 2016 that showed normal EF.  He also had a low risk Myoview in 02/2016.  Done at [facility].  On my interview of the patient he tells me that he lives with his girlfriend.  He has not been very active in about the last 2 years.  He denies any prior cardiac history.  He is in the process of being worked up for a renal transplant at [facility] but has not yet had any cardiac testing.  He is a prior smoking of about 1.5 packs/day for about 20 years, having quit about 2.5 years ago.  He was vaping until about 6 months ago.  He rarely drinks alcohol.  He has had pressure across his lower chest "like a ton of bricks" that has been constant since Sunday morning and is still present without any change in degree.  He also has mild dyspnea on exertion, no dyspnea at rest.  He is lying almost flat with no shortness of breath but some mild pressure in his throat area.  He has not had any edema.   Patient tells me that he was diagnosed with vasovagal syncope several years ago.   This usually happens when he is using the bathroom and seems to occur 2-3 times per year.  He says that he passed out Saturday night after getting up to use the bathroom in the middle of the night.  He had not yet sat on the toilet when he lost consciousness and fell.  He was lightheaded prior to syncope but denies any feeling of palpitations or fast heartbeat.  He thinks that he was out for about 20 minutes according to what his girlfriend told him. He has had several milder episodes since that time and continues to feel intermittently dizzy while here.  Blood pressures have been known to be low intermittently and have been low over the last 2 weeks and his blood pressure medicines have been held.  He does peritoneal dialysis every night while he is sleeping and 1 exchange during the day. He takes lasix 120 mg BID at home but puts our very little urine, "drops here and there".   Heart Pathway Score:     Past Medical History:  Diagnosis Date  . Arthritis   . Diabetes ([place])   . History of peritoneal dialysis   . Hypertension   . Renal disorder    FFGS  . Stroke [facility])    when ha was a child  . Vasovagal syndrome    with syncope  Past Surgical History:  Procedure Laterality Date  . AV FISTULA INSERTION W/ RF MAGNETIC GUIDANCE Left   . CHOLECYSTECTOMY    . HERNIA REPAIR    . TONSILLECTOMY       Home Medications:  Prior to Admission medications   Medication Sig Start Date End Date Taking? Authorizing Provider  atorvastatin (LIPITOR) 20 MG tablet Take 20 mg by mouth daily. 01/12/19  Yes [provider]  buPROPion (WELLBUTRIN SR) 150 MG 12 hr tablet Take 150 mg by mouth 2 (two) times daily. 01/20/19  Yes [provider]  DULoxetine (CYMBALTA) 60 MG capsule Take 60 mg by mouth 2 (two) times daily. 01/11/19  Yes [provider]  furosemide (LASIX) 40 MG tablet Take 120 mg by mouth in the morning and at bedtime.   Yes [provider]  gentamicin cream  (GARAMYCIN) 0.1 % Apply 1 application topically every other day. Applied to catheter post cleaning   Yes [provider]  HYDROcodone-acetaminophen (NORCO/VICODIN) 5-325 MG tablet Take 1 tablet by mouth every 8 (eight) hours as needed for moderate pain.  02/20/19  Yes [provider]  hydrOXYzine (ATARAX/VISTARIL) 25 MG tablet Take 25 mg by mouth every 8 (eight) hours as needed for itching.  01/21/19  Yes [provider]  Insulin Glargine (BASAGLAR KWIKPEN) 100 UNIT/ML SOPN Inject 22 Units into the skin every evening.  03/10/19  Yes [provider]  pioglitazone (ACTOS) 45 MG tablet Take 45 mg by mouth daily. 01/21/19  Yes [provider]  pregabalin (LYRICA) 100 MG capsule Take 100 mg by mouth 3 (three) times daily. 02/20/19  Yes [provider]  RENVELA 800 MG tablet Take 1,600-3,200 mg by mouth See admin instructions. Take 4 tablets (3200mg  total)  by mouth with meals three times daily and take 2 tablets (1600mg  total) with snacks 12/07/18  Yes [provider]  tiZANidine (ZANAFLEX) 4 MG tablet Take 4 mg by mouth 3 (three) times daily. 03/02/19  Yes [provider]  TRULICITY 1.5 0000000 SOPN Inject 1.5 mg into the skin every Tuesday.  03/10/19  Yes [provider]  carvedilol (COREG) 25 MG tablet Take 25 mg by mouth 2 (two) times daily. 01/12/19   [provider]  hydrALAZINE (APRESOLINE) 25 MG tablet Take 25 mg by mouth 3 (three) times daily. 01/13/19   [provider]    Inpatient Medications: Scheduled Meds: . aspirin  325 mg Oral Once  . atorvastatin  40 mg Oral q1800  . buPROPion  150 mg Oral BID  . DULoxetine  60 mg Oral BID  . famotidine  20 mg Oral BID  . [START ON 03/23/2019] pneumococcal 23 valent vaccine  0.5 mL Intramuscular Tomorrow-1000  . pregabalin  100 mg Oral TID  . sevelamer carbonate  3,200 mg Oral TID WC   Continuous Infusions: . heparin 1,100 Units/hr (03/22/19 0244)   PRN  Meds: acetaminophen, nitroGLYCERIN, ondansetron (ZOFRAN) IV, sevelamer carbonate  Allergies:    Allergies  Allergen Reactions  . Doxycycline Hives  . Latex Swelling    Pt reports swelling at site.   . Morphine And Related Anxiety    Social History:   Social History   Socioeconomic History  . Marital status: Legally Separated    Spouse name: Not on file  . Number of children: Not on file  . Years of education: Not on file  . Highest education level: Not on file  Occupational History  . Not on file  Tobacco Use  .  Smoking status: Former Smoker    Quit date: 11/16/2018    Years since quitting: 0.3  . Smokeless tobacco: Former Network engineer and Sexual Activity  . Alcohol use: Not on file    Comment: occ  . Drug use: Never  . Sexual activity: Not on file  Other Topics Concern  . Not on file  Social History Narrative  . Not on file   Social Determinants of Health   Financial Resource Strain:   . Difficulty of Paying Living Expenses: Not on file  Food Insecurity:   . Worried About Charity fundraiser in the Last Year: Not on file  . Ran Out of Food in the Last Year: Not on file  Transportation Needs:   . Lack of Transportation (Medical): Not on file  . Lack of Transportation (Non-Medical): Not on file  Physical Activity:   . Days of Exercise per Week: Not on file  . Minutes of Exercise per Session: Not on file  Stress:   . Feeling of Stress : Not on file  Social Connections:   . Frequency of Communication with Friends and Family: Not on file  . Frequency of Social Gatherings with Friends and Family: Not on file  . Attends Religious Services: Not on file  . Active Member of Clubs or Organizations: Not on file  . Attends Archivist Meetings: Not on file  . Marital Status: Not on file  Intimate Partner Violence:   . Fear of Current or Ex-Partner: Not on file  . Emotionally Abused: Not on file  . Physically Abused: Not on file  . Sexually Abused: Not  on file    Family History:    Family History  Problem Relation Age of Onset  . CAD Mother        "angina"  . Hypertension Mother   . Diabetes Mother   . Hypercholesterolemia Mother   . Diabetes Father   . Hypertension Father   . Hypercholesterolemia Father   . Healthy Brother      ROS:  Please see the history of present illness.   All other ROS reviewed and negative.     Physical Exam/Data:   Vitals:   03/22/19 0528 03/22/19 0534 03/22/19 0544 03/22/19 0952  BP:  102/86  112/75  Pulse: 95 (!) 102  98  Resp:    18  Temp:   98.2 F (36.8 C) 97.6 F (36.4 C)  TempSrc:   Oral   SpO2: 100%   97%  Weight:      Height:        Intake/Output Summary (Last 24 hours) at 03/22/2019 1050 Last data filed at 03/22/2019 0355 Gross per 24 hour  Intake 550 ml  Output --  Net 550 ml   Last 3 Weights 03/22/2019  Weight (lbs) 290 lb  Weight (kg) 131.543 kg     Body mass index is 42.83 kg/m.  General:  Well nourished, well developed, in no acute distress HEENT: normal Lymph: no adenopathy Neck: no JVD Endocrine:  No thryomegaly Vascular: No carotid bruits; pedal pulses 1+ bilaterally  Cardiac:  normal S1, S2; RRR; no murmur  Lungs:  clear to auscultation bilaterally, no wheezing, rhonchi or rales  Abd: soft, nontender, no hepatomegaly  Ext: no edema Musculoskeletal:  No deformities, BUE and BLE strength normal and equal Skin: warm and dry  Neuro:  CNs 2-12 intact, no focal abnormalities noted Psych:  Normal affect   EKG:  The EKG was personally reviewed  and demonstrates:  NSR, 96 bpm, early upstroke of the T waves diffusely, abnormal r wave progression, possible old anterior MI Telemetry:  Telemetry was personally reviewed and demonstrates:  NSR   Relevant CV Studies:  Myocardial perfusion exercise stress test 03/14/2016 CONCLUSIONS:  1. No significant symptoms or ECG changes after exercise.   2. Normal myocardial perfusion study without evidence of inducible  ischemia.   3. Normal left ventricular wall motion and segmental function, LVEF 68%.   4. The study suggests a low risk.  Echocardiogram 12/16/2014 Findings  Mitral Valve  The mitral valve appears structurally normal.  There is no evidence of mitral regurgitaton.  Aortic Valve  There is a normal appearing trileaflet aortic valve.  There is no significant aortic stenosis or insufficiency.  Tricuspid Valve  The tricuspid valve appears normal.  No evidence of tricuspid regurgitation.  Pulmonic Valve  The pulmonic valve is not well visualized.  No pulmonary regurgitation is seen.  Left Atrium  The left atrial size is normal.  Left Ventricle  The estimated left ventricular ejection fraction is 55 - 60%.  There is mild to moderate concentric left ventricular hypertrophy seen.  Right Atrium  Right atrial size is normal.  Right Ventricle  There is normal right ventricular size and function.  Pericardium  No pericardial effusion or thickening is seen.  Interatrial Septum  The interatrial septum appears intact, without evidence for patent foramen  ovale.  Miscellaneous  The aortic root is normal in size.  The inferior vena cava is normal in size and demonstrates normal  inspiratory collapse.  Conclusions  Summary  The estimated left ventricular ejection fraction is 55 - 60%.  There is mild to moderate concentric left ventricular hypertrophy seen.  No significant valvular dysfunction.  Laboratory Data:  High Sensitivity Troponin:   Recent Labs  Lab 03/21/19 1815 03/21/19 2029 03/22/19 0106 03/22/19 0741  TROPONINIHS 682* 939* 1,997* 3,490*     Chemistry Recent Labs  Lab 03/21/19 1815  NA 134*  K 3.0*  CL 92*  CO2 24  GLUCOSE 158*  BUN 58*  CREATININE 15.03*  CALCIUM 10.2  GFRNONAA 3*  GFRAA 4*  ANIONGAP 18*    No results for input(s): PROT, ALBUMIN, AST, ALT, ALKPHOS, BILITOT in the last 168 hours. Hematology Recent Labs  Lab 03/21/19 1815  WBC 10.2   RBC 6.29*  HGB 18.8*  HCT 57.5*  MCV 91.4  MCH 29.9  MCHC 32.7  RDW 18.0*  PLT 101*   BNPNo results for input(s): BNP, PROBNP in the last 168 hours.  DDimer No results for input(s): DDIMER in the last 168 hours.   Radiology/Studies:  DG Chest Port 1 View  Result Date: 03/21/2019 CLINICAL DATA:  44 year old male with chest pain. EXAM: PORTABLE CHEST 1 VIEW COMPARISON:  None. FINDINGS: The heart size and mediastinal contours are within normal limits. Both lungs are clear. The visualized skeletal structures are unremarkable. IMPRESSION: No active disease. Electronically Signed   By: Anner Crete M.D.   On: 03/21/2019 19:16      Assessment and Plan:   NSTEMI -Patient presented with multiple episodes of syncope over the last several days (hx of vasovagal syncope and hypotension) and chest pain that began on Sunday as pressure across the lower chest and has been constant since. Also mild DOE.  -CVD risk factors include hypertension, diabetes, hyperlipidemia, obesity. -Troponins have trended up 682>[id number]>3491. -EKG showed sinus tachycardia, diffuse ST changes, abnormal r wave progression, no STEMI.  Will repeat EKG  for progression. -IV heparin is infusing for ACS. -Chest x-ray with no active disease. -Echocardiogram has been ordered to assess LV function and wall motion. -Patient is n.p.o.  Plan for cardiac catheterization today with possible PCI.   History vasovagal syncope -Patient notes that he usually has syncope with using the bathroom, a couple of times a year -Patient had reported syncope Saturday night after getting up to use the bathroom in the middle of the night.  He had not yet sat on the toilet when he lost consciousness and fell.  He was lightheaded prior to syncope but denies any feeling of palpitations or fast heartbeat.  He has had several milder episodes since that time and continues to feel intermittently dizzy while here.  Blood pressures have been known to be  low over the last 2 weeks and his blood pressure medicines have been held. -No arrhythmias so far on telemetry.   Hypokalemia -K+ 3.0 on presentation. Pt was given supplemental potassium.  We will check a follow-up metabolic panel.  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 I spoke to Dr. Jonnie Finner and informed him that the pt will have cath today. He will arrange for PD tonight.   Thrombocytopenia -Platelets 101.  Hemoglobin is elevated at 18.8.  Hypertension -Patient was hypotensive on presentation down to 85/41.  Home carvedilol and hydralazine have been held for about the last 2 weeks due to low BP.  -Blood pressure is currently improved, 112/75.  Diabetes type 2 on insulin -A1c 6.6, adequate control. -Management Per primary team -Home Lantus, Actos and Trulicity on hold. -Patient is currently on sliding scale insulin.  History of stroke -Reportedly at age 43 with mild residual left-sided weakness.  Hyperlipidemia -On atorvastatin 20 mg daily at home -Patient has been ordered Lipitor 40 mg daily while here. -LDL is 53.  Pending cath results would set his goal at less than 70.      For questions or updates, please contact [place] Please consult [email or web address] for contact info under     Signed, Daune Perch, NP  03/22/2019 10:50 AM   ATTENDING ATTESTATION  I have seen, examined and evaluated the patient this AM along with Ms. Hammond-NP.  After reviewing all the available data and chart, we discussed the patients laboratory, study & physical findings as well as symptoms in detail. I agree with her findings, examination as well as impression recommendations as per our discussion.    Max Pittman is a very pleasant 44 year old gentleman who has been on renal dialysis for ESRD secondary to FSGS for about 2 years now.  He also has hypertension hyperlipidemia and diabetes, history of smoking with a distant history of childhood  stroke.  He now presents via transfer from [facility] after presenting with chest pain and worsening shortness of breath is been present for the last couple days.  He is ruled in for non-ST elevation MI with troponin elevation.  Currently still has minimal residual chest pain but nothing significant.  He is on IV heparin.  EKG shows very poor R wave progression with anteroseptal Q waves suggestive of likely old anterior septal MI. Exam is relatively benign besides being morbidly obese.  Heart sounds are distant.  No obvious M/R/G.  Because of orthostatic hypotension, he is no longer on hydralazine and carvedilol because of significant hypotension with history of orthostatic vasovagal syncope.  We will therefore hold off on any blood pressure medications.  Given his risk factors  and positive troponin, must consider his subacute presentation is a non-ST elevation MI.  He has also yet to receive PD which will hopefully be tonight.  Plan: Agree with consideration for urgent Catheterization and Possible PCI. Following cath, he will be transferred to the cardiology floor for post-cath care.   Performing MD:  Glenetta Hew, M.D., M.S.  Procedure: LEFT HEART CATHETERIZATION WITH CORONARY ANGIOGRAPHY AND POSSIBLE PERCUTANEOUS CORONARY INTERVENTION  The procedure with Risks/Benefits/Alternatives and Indications was reviewed with the patient.  All questions were answered.    Risks / Complications include, but not limited to: Death, MI, CVA/TIA, VF/VT (with defibrillation), Bradycardia (need for temporary pacer placement), bleeding / bruising / hematoma / pseudoaneurysm, vascular or coronary injury (with possible emergent CT or Vascular Surgery), adverse medication reactions, infection.  Additional risks involving the use of radiation with the possibility of radiation burns and cancer were explained in detail.  The patient voices understanding and agrees` to proceed.    More plans to follow-up  after cardiac catheterization and possible PCI.   Glenetta Hew, M.D., M.S. Interventional Cardiologist   Pager # [phone] Phone # [phone] [address] [place], [place] [address]

## 2019-03-22 NOTE — Progress Notes (Addendum)
[name] for heparin Indication: chest pain/ACS  Allergies  Allergen Reactions  . Doxycycline Hives  . Latex Swelling    Pt reports swelling at site.   . Morphine And Related Anxiety    Patient Measurements: Height: 5\' 9"  (175.3 cm) Weight: 290 lb (131.5 kg) IBW/kg (Calculated) : 70.7 Heparin Dosing Weight: 101 kg  Vital Signs: Temp: 98.2 F (36.8 C) (02/24 0544) Temp Source: Oral (02/24 0544) BP: 102/86 (02/24 0534) Pulse Rate: 102 (02/24 0534)  Labs: Recent Labs    03/21/19 1815 03/21/19 1815 03/21/19 2029 03/22/19 0106 03/22/19 0741  HGB 18.8*  --   --   --   --   HCT 57.5*  --   --   --   --   PLT 101*  --   --   --   --   HEPARINUNFRC  --   --   --   --  0.47  CREATININE 15.03*  --   --   --   --   TROPONINIHS 682*   < > 939* 1,997* 3,490*   < > = values in this interval not displayed.    Estimated Creatinine Clearance: 8.6 mL/min (A) (by C-G formula based on SCr of 15.03 mg/dL (H)).   Assessment: 44 yo man to continue IV heparin for r/o ACS.  His PTLC is low (unsure of baseline), so aiming for lower heparin goal.  Troponin is rising.  Heparin level is therapeutic; no bleeding reported.  Goal of Therapy:  Heparin level 0.3-0.5 units/ml Monitor platelets by anticoagulation protocol: Yes   Plan:  Continue heparin gtt at 1100 units/hr Check confirmatory heparin level Daily heparin level and CBC  [name] D. [name], PharmD, BCPS, [place] 03/22/2019, 9:09 AM

## 2019-03-23 DIAGNOSIS — I208 Other forms of angina pectoris: Secondary | ICD-10-CM

## 2019-03-23 DIAGNOSIS — R778 Other specified abnormalities of plasma proteins: Secondary | ICD-10-CM

## 2019-03-23 DIAGNOSIS — I200 Unstable angina: Secondary | ICD-10-CM

## 2019-03-23 LAB — BASIC METABOLIC PANEL
Anion gap: 17 — ABNORMAL HIGH (ref 5–15)
BUN: 58 mg/dL — ABNORMAL HIGH (ref 6–20)
CO2: 19 mmol/L — ABNORMAL LOW (ref 22–32)
Calcium: 8.8 mg/dL — ABNORMAL LOW (ref 8.9–10.3)
Chloride: 100 mmol/L (ref 98–111)
Creatinine, Ser: 14.65 mg/dL — ABNORMAL HIGH (ref 0.61–1.24)
GFR calc Af Amer: 4 mL/min — ABNORMAL LOW (ref >60)
GFR calc non Af Amer: 4 mL/min — ABNORMAL LOW (ref >60)
Glucose, Bld: 126 mg/dL — ABNORMAL HIGH (ref 70–99)
Potassium: 2.7 mmol/L — CL (ref 3.5–5.1)
Sodium: 136 mmol/L (ref 135–145)

## 2019-03-23 LAB — GLUCOSE, CAPILLARY
Glucose-Capillary: 150 mg/dL — ABNORMAL HIGH (ref 70–99)
Glucose-Capillary: 126 mg/dL — ABNORMAL HIGH (ref 70–99)
Glucose-Capillary: 102 mg/dL — ABNORMAL HIGH (ref 70–99)
Glucose-Capillary: 179 mg/dL — ABNORMAL HIGH (ref 70–99)
Glucose-Capillary: 84 mg/dL (ref 70–99)
Glucose-Capillary: 135 mg/dL — ABNORMAL HIGH (ref 70–99)

## 2019-03-23 LAB — CBC
HCT: 45.5 % (ref 39.0–52.0)
Hemoglobin: 15.1 g/dL (ref 13.0–17.0)
MCH: 29.5 pg (ref 26.0–34.0)
MCHC: 33.2 g/dL (ref 30.0–36.0)
MCV: 88.9 fL (ref 80.0–100.0)
Platelets: 73 10*3/uL — ABNORMAL LOW (ref 150–400)
RBC: 5.12 MIL/uL (ref 4.22–5.81)
RDW: 16.2 % — ABNORMAL HIGH (ref 11.5–15.5)
WBC: 7.7 10*3/uL (ref 4.0–10.5)
nRBC: 0 % (ref 0.0–0.2)

## 2019-03-23 MED ORDER — ISOSORBIDE MONONITRATE ER 30 MG PO TB24
15.0000 mg | ORAL_TABLET | Freq: Every day | ORAL | Status: DC
  Administered 2019-03-23 – 2019-03-24 (×2): 15 mg via ORAL
  Filled 2019-03-23 (×2): qty 1

## 2019-03-23 MED ORDER — HEPARIN 1000 UNIT/ML FOR PERITONEAL DIALYSIS
INTRAPERITONEAL | Status: DC | PRN
  Filled 2019-03-23: qty 3000

## 2019-03-23 MED ORDER — POTASSIUM CHLORIDE CRYS ER 20 MEQ PO TBCR
40.0000 meq | EXTENDED_RELEASE_TABLET | Freq: Three times a day (TID) | ORAL | Status: AC
  Administered 2019-03-23 (×2): 40 meq via ORAL
  Filled 2019-03-23 (×2): qty 2

## 2019-03-23 MED ORDER — HYDROCODONE-ACETAMINOPHEN 5-325 MG PO TABS
1.0000 | ORAL_TABLET | Freq: Three times a day (TID) | ORAL | Status: DC | PRN
  Administered 2019-03-23: 1 via ORAL
  Filled 2019-03-23: qty 1

## 2019-03-23 MED ORDER — SODIUM CHLORIDE 0.9 % IV BOLUS
1000.0000 mL | Freq: Once | INTRAVENOUS | Status: AC
  Administered 2019-03-23: 1000 mL via INTRAVENOUS

## 2019-03-23 MED ORDER — HEPARIN 1000 UNIT/ML FOR PERITONEAL DIALYSIS: 500.0000 [IU] | INTRAMUSCULAR | Status: DC | PRN

## 2019-03-23 MED ORDER — TIZANIDINE HCL 4 MG PO TABS
4.0000 mg | ORAL_TABLET | Freq: Three times a day (TID) | ORAL | Status: DC | PRN
  Administered 2019-03-23: 4 mg via ORAL
  Filled 2019-03-23: qty 1

## 2019-03-23 MED ORDER — POTASSIUM CHLORIDE CRYS ER 20 MEQ PO TBCR
20.0000 meq | EXTENDED_RELEASE_TABLET | Freq: Once | ORAL | Status: AC
  Administered 2019-03-23: 20 meq via ORAL
  Filled 2019-03-23: qty 1

## 2019-03-23 MED ORDER — DELFLEX-LC/1.5% DEXTROSE 344 MOSM/L IP SOLN: INTRAPERITONEAL | Status: DC

## 2019-03-23 MED ORDER — FAMOTIDINE 20 MG PO TABS
10.0000 mg | ORAL_TABLET | Freq: Every day | ORAL | Status: DC
  Administered 2019-03-24: 10 mg via ORAL
  Filled 2019-03-23: qty 1

## 2019-03-23 MED ORDER — GENTAMICIN SULFATE 0.1 % EX CREA
1.0000 "application " | TOPICAL_CREAM | Freq: Every day | CUTANEOUS | Status: DC
  Administered 2019-03-23 – 2019-03-24 (×2): 1 via TOPICAL
  Filled 2019-03-23: qty 15

## 2019-03-23 NOTE — Progress Notes (Signed)
[facility] Progress Note  Subjective: feeling a little better  Vitals:   03/23/19 0125 03/23/19 0621 03/23/19 0730 03/23/19 0803  BP:  110/72 127/74 (!) 139/98  Pulse:  93 91 99  Resp:  20 17 20   Temp:  97.6 F (36.4 C) 98.3 F (36.8 C) 98.2 F (36.8 C)  TempSrc:  Oral Oral Oral  SpO2:  93% 97% 97%  Weight: 128.2 kg  125.9 kg   Height:        Exam: Gen obese WM no distress No jvd or bruits Chest clear bilat to bases RRR no MRG Abd soft ntnd no mass or ascites +bs , PD cath LLQ GU normal male  MS no joint effusions or deformity Ext no sig LE edema, no wounds or ulcers Neuro is alert, Ox 3 , nf    Home meds:  - furosemide 120 bid/ carvedilol 25 bid/ hydralazine 25 tid  - turlicity 1.5 weekly/ pioglitazone 45 qd  - bupropion sr 150 bid/ duloxetine 60 bid/ hydrocodone prn/ pregablin 100 tid/ tizanidine 4mg  tid  - atorvastatin 20  - prn's/ vitamins/ supplements     Outpt HD: CCPD  Hangs 18L at night and does 3L day bag, 9.5 hr overnight , 5 exchanges, not sure dwell vol     Assessment/ Plan: 1. Chest pain / syncope/ Max Pittman - sp LHC , no sig CAD by cath 2. ESRD - on CCPD x 1 yr, getting closer to adequacy marker 1.7, last checked at 1.67 per pt. PD nightly, all 1.5% given passing out 3. Syncope -high Hb (18 > 15 after saline bolus) is a signal of dehydration. Get orthostatics, will give another 1L NS today, use lowest dextrose w/ PD (1.5%) to minimize vol removal on PD.  4. HTN/vol - holding home coreg/ hydralazine. BP's low normal still.  May need to drop some of his antiHTN'sives.  5. DM2 - per primary 6. Hypokalemia - 2.7, po repletion ordered 40 x 2 (and had 62meq this am already) 7. Anemia ckd - Hb 18 > 15 today, as above, no esa needed.      Max Pittman 03/23/2019, 9:47 AM   Recent Labs  Lab 03/22/19 0741 03/23/19 0515  K 3.0* 2.7*  BUN 61* 58*  CREATININE 15.73* 14.65*  CALCIUM 9.8 8.8*   Inpatient medications: . aspirin   325 mg Oral Once  . atorvastatin  40 mg Oral q1800  . buPROPion  150 mg Oral BID  . DULoxetine  60 mg Oral BID  . famotidine  20 mg Oral BID  . gentamicin cream  1 application Topical Daily  . insulin aspart  0-9 Units Subcutaneous Q4H  . pregabalin  100 mg Oral TID  . sevelamer carbonate  3,200 mg Oral TID WC   . dialysis solution 1.5% low-MG/low-CA     acetaminophen, dianeal solution for CAPD/CCPD with heparin, nitroGLYCERIN, ondansetron (ZOFRAN) IV, sevelamer carbonate

## 2019-03-23 NOTE — Progress Notes (Signed)
[facility] PROGRESS NOTE    Progress Note  Max Pittman  [id number] DOB: 10-Nov-1975 DOA: 03/21/2019 PCP: Chesley Noon, MD     Brief Narrative:   Max Pittman is an 44 y.o. male past medical history of end-stage renal disease on peritoneal dialysis, CVA hypertension diabetes mellitus type 2 came into the hospital with several episodes of chest pain and syncope markers were elevated cardiology was consulted  Assessment/Plan:   Syncope and hypotension due to NSTEMI (non-ST elevated myocardial infarction) [facility]): He relates several episodes of vasovagal syncope and chest pain that began 2 days prior to admission he also relates dyspnea on exertion.  His cardiac biomarkers have significantly elevated and doubled when checked.  EKG showed diffuse ST segment changes abnormal R wave progression. Cardiology was consulted recommended to start IV heparin, aspirin and nitroglycerin (for chest pain) his Coreg has been held significant hypotension with a history of vasovagal. Cardiac cath performed on 03/22/2019 moderate one-vessel coronary artery disease and significant spasm in the right proximal, cardiology recommended to start Imdur.  End-stage renal disease on peritoneal dialysis: We have consulted renal he is status post dialysis on 03/21/2019. Appreciate renal's assistance.  Thrombocytopenia: His platelet count is trending down, today 73. Unclear he denies any alcohol abuse.  Hemoglobin has been stable.  Hypokalemia: Further management per renal.  DM type 2: A1c 6.6, cont SSI.  Essential HTN: Cont to hold Coreg and hydralzine.  History of stroke: Noted  Obesity:  Estimated body mass index is 40.99 kg/m as calculated from the following:   Height as of this encounter: 5\' 9"  (1.753 m).   Weight as of this encounter: 125.9 kg.  DVT prophylaxis: heparin Family Communication:none Disposition Plan/Barrier to D/C: once cleared by cards  Code Status:     Code Status  Orders  (From admission, onward)         Start     Ordered   03/22/19 0553  Full code  Continuous     03/22/19 0552        Code Status History    This patient has a current code status but no historical code status.   Advance Care Planning Activity        IV Access:    Peripheral IV   Procedures and diagnostic studies:   CARDIAC CATHETERIZATION  Result Date: 03/22/2019  Mid LAD lesion is 20% stenosed.  The left ventricular systolic function is normal.  LV end diastolic pressure is mildly elevated.  Prox RCA lesion is 40% stenosed.  1.  Moderate one-vessel coronary artery disease involving the proximal right coronary artery.  Significant spasm noted in the ostial proximal RCA improved significantly with intracoronary nitroglycerin.  No other obstructive disease noted. 2.  Normal LV systolic function and mildly elevated left ventricular end-diastolic pressure. Recommendations: No clear culprit is identified for non-ST elevation myocardial infarction.  Right coronary spasm is a possibility.  Treating this might be difficult given the patient's recurrent syncope and hypotension.  I am going to add small dose Imdur 15 mg daily. Continue aggressive treatment of risk factors.   DG Chest Port 1 View  Result Date: 03/21/2019 CLINICAL DATA:  44 year old male with chest pain. EXAM: PORTABLE CHEST 1 VIEW COMPARISON:  None. FINDINGS: The heart size and mediastinal contours are within normal limits. Both lungs are clear. The visualized skeletal structures are unremarkable. IMPRESSION: No active disease. Electronically Signed   By: Anner Crete M.D.   On: 03/21/2019 19:16   ECHOCARDIOGRAM COMPLETE  Result  Date: 03/22/2019    ECHOCARDIOGRAM REPORT   Patient Name:   Max Pittman Date of Exam: 03/22/2019 Medical Rec #:  [id number]   Height:       69.0 in Accession #:    [id number]  Weight:       290.0 lb Date of Birth:  March 19, 1975   BSA:          2.419 m Patient Age:    66 years    BP:            112/75 mmHg Patient Gender: M           HR:           98 bpm. Exam Location:  Inpatient Procedure: 2D Echo and Intracardiac Opacification Agent Indications:    Chest Pain 786.50 / R07.9  History:        Patient has no prior history of Echocardiogram examinations.                 STEMI and Angina, Stroke; Risk Factors:Hypertension and                 Diabetes. End stage renal disease.  Sonographer:    Darlina Sicilian RDCS Referring Phys: [phone] Leanne Chang [facility]  Sonographer Comments: Technically challenging study due to limited acoustic windows. Image acquisition challenging due to patient body habitus. IMPRESSIONS  1. Left ventricular ejection fraction, by estimation, is 60 to 65%. The left ventricle has normal function. The left ventricle has no regional wall motion abnormalities. Indeterminate diastolic filling due to E-A fusion.  2. Right ventricular systolic function is normal. The right ventricular size is normal. Tricuspid regurgitation signal is inadequate for assessing PA pressure.  3. The mitral valve is grossly normal. Trivial mitral valve regurgitation. No evidence of mitral stenosis.  4. The aortic valve is grossly normal. Aortic valve regurgitation is not visualized. No aortic stenosis is present. FINDINGS  Left Ventricle: Left ventricular ejection fraction, by estimation, is 60 to 65%. The left ventricle has normal function. The left ventricle has no regional wall motion abnormalities. Definity contrast agent was given IV to delineate the left ventricular  endocardial borders. The left ventricular internal cavity size was normal in size. There is no left ventricular hypertrophy. Indeterminate diastolic filling due to E-A fusion. Right Ventricle: The right ventricular size is normal. No increase in right ventricular wall thickness. Right ventricular systolic function is normal. Tricuspid regurgitation signal is inadequate for assessing PA pressure. Left Atrium: Left atrial size was normal in size.  Right Atrium: Right atrial size was normal in size. Pericardium: Trivial pericardial effusion is present. Presence of pericardial fat pad. Mitral Valve: The mitral valve is grossly normal. Trivial mitral valve regurgitation. No evidence of mitral valve stenosis. Tricuspid Valve: The tricuspid valve is grossly normal. Tricuspid valve regurgitation is not demonstrated. Aortic Valve: The aortic valve is grossly normal. Aortic valve regurgitation is not visualized. No aortic stenosis is present. Pulmonic Valve: The pulmonic valve was grossly normal. Pulmonic valve regurgitation is not visualized. Aorta: The aortic root and ascending aorta are structurally normal, with no evidence of dilitation. Venous: The inferior vena cava was not well visualized. IAS/Shunts: The interatrial septum was not well visualized.  LEFT VENTRICLE PLAX 2D LVIDd:         3.80 cm  Diastology LVIDs:         2.60 cm  LV e' lateral:   9.36 cm/s LV PW:         0.90 cm  LV E/e' lateral: 9.3 LV IVS:        1.30 cm  LV e' medial:    7.51 cm/s LVOT diam:     2.20 cm  LV E/e' medial:  11.6 LV SV:         47 LV SV Index:   19 LVOT Area:     3.80 cm  RIGHT VENTRICLE RV S prime:     17.10 cm/s TAPSE (M-mode): 1.7 cm LEFT ATRIUM             Index LA diam:        3.40 cm 1.41 cm/m LA Vol (A2C):   38.7 ml 16.00 ml/m LA Vol (A4C):   34.3 ml 14.18 ml/m LA Biplane Vol: 37.1 ml 15.33 ml/m  AORTIC VALVE LVOT Vmax:   86.70 cm/s LVOT Vmean:  54.700 cm/s LVOT VTI:    0.123 m  AORTA Ao Root diam: 2.90 cm MITRAL VALVE MV Area (PHT): 3.85 cm     SHUNTS MV Decel Time: 197 msec     Systemic VTI:  0.12 m MV E velocity: 86.80 cm/s   Systemic Diam: 2.20 cm MV A velocity: 105.00 cm/s MV E/A ratio:  0.83 Eleonore Chiquito MD Electronically signed by Eleonore Chiquito MD Signature Date/Time: 03/22/2019/3:38:46 PM    Final      Medical Consultants:    None.  Anti-Infectives:   None  Subjective:    Max Pittman relates no further chest pain.  Objective:    Vitals:    03/23/19 0041 03/23/19 0125 03/23/19 0621 03/23/19 0730  BP: 104/71  110/72 127/74  Pulse: 95  93 91  Resp: 18  20   Temp: 97.8 F (36.6 C)  97.6 F (36.4 C) 98.3 F (36.8 C)  TempSrc: Oral  Oral Oral  SpO2: 90%  93% 97%  Weight:  128.2 kg  125.9 kg  Height:       SpO2: 97 % O2 Flow Rate (L/min): 2 L/min   Intake/Output Summary (Last 24 hours) at 03/23/2019 0751 Last data filed at 03/23/2019 0400 Gross per 24 hour  Intake 720 ml  Output --  Net 720 ml   Filed Weights   03/22/19 1700 03/23/19 0125 03/23/19 0730  Weight: 128 kg 128.2 kg 125.9 kg    Exam: General exam: In no acute distress. Respiratory system: Good air movement and clear to auscultation. Cardiovascular system: S1 & S2 heard, RRR. No JVD. Gastrointestinal system: Abdomen is nondistended, soft and nontender.  Extremities: No pedal edema. Skin: No rashes, lesions or ulcers Psychiatry: Judgement and insight appear normal. Mood & affect appropriate. Data Reviewed:    Labs: Basic Metabolic Panel: Recent Labs  Lab 03/21/19 1815 03/21/19 1815 03/22/19 0741 03/23/19 0515  NA 134*  --  137 136  K 3.0*   < > 3.0* 2.7*  CL 92*  --  97* 100  CO2 24  --  20* 19*  GLUCOSE 158*  --  109* 126*  BUN 58*  --  61* 58*  CREATININE 15.03*  --  15.73* 14.65*  CALCIUM 10.2  --  9.8 8.8*  MG 1.8  --   --   --    < > = values in this interval not displayed.   GFR Estimated Creatinine Clearance: 8.6 mL/min (A) (by C-G formula based on SCr of 14.65 mg/dL (H)). Liver Function Tests: No results for input(s): AST, ALT, ALKPHOS, BILITOT, PROT, ALBUMIN in the last 168 hours. No results for input(s): LIPASE, AMYLASE in  the last 168 hours. No results for input(s): AMMONIA in the last 168 hours. Coagulation profile No results for input(s): INR, PROTIME in the last 168 hours. COVID-19 Labs  No results for input(s): DDIMER, FERRITIN, LDH, CRP in the last 72 hours.  Lab Results  Component Value Date   [place]  NEGATIVE 03/21/2019    CBC: Recent Labs  Lab 03/21/19 1815 03/23/19 0515  WBC 10.2 7.7  NEUTROABS 6.3  --   HGB 18.8* 15.1  HCT 57.5* 45.5  MCV 91.4 88.9  PLT 101* 73*   Cardiac Enzymes: No results for input(s): CKTOTAL, CKMB, CKMBINDEX, TROPONINI in the last 168 hours. BNP (last 3 results) No results for input(s): PROBNP in the last 8760 hours. CBG: Recent Labs  Lab 03/22/19 1206 03/22/19 1610 03/22/19 2106 03/23/19 0600  GLUCAP 99 104* 150* 126*   D-Dimer: No results for input(s): DDIMER in the last 72 hours. Hgb A1c: Recent Labs    03/21/19 1815  HGBA1C 6.6*   Lipid Profile: Recent Labs    03/22/19 0741  CHOL 115  HDL 29*  LDLCALC 53  TRIG 165*  CHOLHDL 4.0   Thyroid function studies: No results for input(s): TSH, T4TOTAL, T3FREE, THYROIDAB in the last 72 hours.  Invalid input(s): FREET3 Anemia work up: No results for input(s): VITAMINB12, FOLATE, FERRITIN, TIBC, IRON, RETICCTPCT in the last 72 hours. Sepsis Labs: Recent Labs  Lab 03/21/19 1815 03/23/19 0515  WBC 10.2 7.7   Microbiology Recent Results (from the past 240 hour(s))  Respiratory Panel by RT PCR (Flu A&B, Covid) - Nasopharyngeal Swab     Status: None   Collection Time: 03/21/19  8:51 PM   Specimen: Nasopharyngeal Swab  Result Value Ref Range Status   SARS Coronavirus 2 by RT PCR NEGATIVE NEGATIVE Final    Comment: (NOTE) SARS-CoV-2 target nucleic acids are NOT DETECTED. The SARS-CoV-2 RNA is generally detectable in upper respiratoy specimens during the acute phase of infection. The lowest concentration of SARS-CoV-2 viral copies this assay can detect is 131 copies/mL. A negative result does not preclude SARS-Cov-2 infection and should not be used as the sole basis for treatment or other patient management decisions. A negative result may occur with  improper specimen collection/handling, submission of specimen other than nasopharyngeal swab, presence of viral mutation(s) within  the areas targeted by this assay, and inadequate number of viral copies (<131 copies/mL). A negative result must be combined with clinical observations, patient history, and epidemiological information. The expected result is Negative. Fact Sheet for Patients:  PinkCheek.be Fact Sheet for Healthcare Providers:  GravelBags.it This test is not yet ap proved or cleared by the [place] FDA and  has been authorized for detection and/or diagnosis of SARS-CoV-2 by FDA under an Emergency Use Authorization (EUA). This EUA will remain  in effect (meaning this test can be used) for the duration of the COVID-19 declaration under Section 564(b)(1) of the Act, 21 U.S.C. section 360bbb-3(b)(1), unless the authorization is terminated or revoked sooner.    Influenza A by PCR NEGATIVE NEGATIVE Final   Influenza B by PCR NEGATIVE NEGATIVE Final    Comment: (NOTE) The Xpert Xpress SARS-CoV-2/FLU/RSV assay is intended as an aid in  the diagnosis of influenza from Nasopharyngeal swab specimens and  should not be used as a sole basis for treatment. Nasal washings and  aspirates are unacceptable for Xpert Xpress SARS-CoV-2/FLU/RSV  testing. Fact Sheet for Patients: PinkCheek.be Fact Sheet for Healthcare Providers: GravelBags.it This test is not yet approved or  cleared by the [place] and  has been authorized for detection and/or diagnosis of SARS-CoV-2 by  FDA under an Emergency Use Authorization (EUA). This EUA will remain  in effect (meaning this test can be used) for the duration of the  Covid-19 declaration under Section 564(b)(1) of the Act, 21  U.S.C. section 360bbb-3(b)(1), unless the authorization is  terminated or revoked. Performed at [facility], [address], [place], [place] [address]      Medications:   . aspirin  325 mg Oral Once  . atorvastatin  40 mg  Oral q1800  . buPROPion  150 mg Oral BID  . DULoxetine  60 mg Oral BID  . famotidine  20 mg Oral BID  . gentamicin cream  1 application Topical Daily  . insulin aspart  0-9 Units Subcutaneous Q4H  . pneumococcal 23 valent vaccine  0.5 mL Intramuscular Tomorrow-1000  . potassium chloride  20 mEq Oral Once  . pregabalin  100 mg Oral TID  . sevelamer carbonate  3,200 mg Oral TID WC  . sodium chloride flush  3 mL Intravenous Q12H  . sodium chloride flush  3 mL Intravenous Q12H   Continuous Infusions: . sodium chloride    . dialysis solution 1.5% low-MG/low-CA        LOS: 1 day   Charlynne Cousins  [facility]  03/23/2019, 7:51 AM

## 2019-03-23 NOTE — Progress Notes (Addendum)
Progress Note  Patient Name: Max Pittman Date of Encounter: 03/23/2019  Primary Cardiologist: No primary care provider on file.   Subjective   Still with some chest tightness this morning.   Inpatient Medications    Scheduled Meds:  aspirin  325 mg Oral Once   atorvastatin  40 mg Oral q1800   buPROPion  150 mg Oral BID   DULoxetine  60 mg Oral BID   famotidine  20 mg Oral BID   gentamicin cream  1 application Topical Daily   insulin aspart  0-9 Units Subcutaneous Q4H   potassium chloride  40 mEq Oral TID   pregabalin  100 mg Oral TID   sevelamer carbonate  3,200 mg Oral TID WC   Continuous Infusions:  dialysis solution 1.5% low-MG/low-CA     PRN Meds: acetaminophen, dianeal solution for CAPD/CCPD with heparin, nitroGLYCERIN, ondansetron (ZOFRAN) IV, sevelamer carbonate   Vital Signs    Vitals:   03/23/19 0125 03/23/19 0621 03/23/19 0730 03/23/19 0803  BP:  110/72 127/74 (!) 139/98  Pulse:  93 91 99  Resp:  20 17 20   Temp:  97.6 F (36.4 C) 98.3 F (36.8 C) 98.2 F (36.8 C)  TempSrc:  Oral Oral Oral  SpO2:  93% 97% 97%  Weight: 128.2 kg  125.9 kg   Height:        Intake/Output Summary (Last 24 hours) at 03/23/2019 1004 Last data filed at 03/23/2019 0806 Gross per 24 hour  Intake 960 ml  Output --  Net 960 ml   Last 3 Weights 03/23/2019 03/23/2019 03/22/2019  Weight (lbs) 277 lb 9 oz 282 lb 11.2 oz 282 lb 3 oz  Weight (kg) 125.9 kg 128.232 kg 128 kg      Telemetry    ST - Personally Reviewed  ECG    No new tracing this morning.  Physical Exam  Pleasant younger WM, sitting up in bed. GEN: No acute distress.   Neck: No JVD Cardiac: RRR, no murmurs, rubs, or gallops.  Respiratory: Clear to auscultation bilaterally. GI: Soft, nontender, non-distended  MS: No edema; No deformity. Right femoral cath site stable. Neuro:  Nonfocal  Psych: Normal affect   Labs    High Sensitivity Troponin:   Recent Labs  Lab 03/21/19 1815  03/21/19 2029 03/22/19 0106 03/22/19 0741 03/22/19 1323  TROPONINIHS 682* 939* 1,997* 3,490* 2,914*      Chemistry Recent Labs  Lab 03/21/19 1815 03/22/19 0741 03/23/19 0515  NA 134* 137 136  K 3.0* 3.0* 2.7*  CL 92* 97* 100  CO2 24 20* 19*  GLUCOSE 158* 109* 126*  BUN 58* 61* 58*  CREATININE 15.03* 15.73* 14.65*  CALCIUM 10.2 9.8 8.8*  GFRNONAA 3* 3* 4*  GFRAA 4* 4* 4*  ANIONGAP 18* 20* 17*     Hematology Recent Labs  Lab 03/21/19 1815 03/23/19 0515  WBC 10.2 7.7  RBC 6.29* 5.12  HGB 18.8* 15.1  HCT 57.5* 45.5  MCV 91.4 88.9  MCH 29.9 29.5  MCHC 32.7 33.2  RDW 18.0* 16.2*  PLT 101* 73*    BNPNo results for input(s): BNP, PROBNP in the last 168 hours.   DDimer No results for input(s): DDIMER in the last 168 hours.   Radiology    CARDIAC CATHETERIZATION  Result Date: 03/22/2019  Mid LAD lesion is 20% stenosed.  The left ventricular systolic function is normal.  LV end diastolic pressure is mildly elevated.  Prox RCA lesion is 40% stenosed.  1.  Moderate  one-vessel coronary artery disease involving the proximal right coronary artery.  Significant spasm noted in the ostial proximal RCA improved significantly with intracoronary nitroglycerin.  No other obstructive disease noted. 2.  Normal LV systolic function and mildly elevated left ventricular end-diastolic pressure. Recommendations: No clear culprit is identified for non-ST elevation myocardial infarction.  Right coronary spasm is a possibility.  Treating this might be difficult given the patient's recurrent syncope and hypotension.  I am going to add small dose Imdur 15 mg daily. Continue aggressive treatment of risk factors.   DG Chest Port 1 View  Result Date: 03/21/2019 CLINICAL DATA:  44 year old male with chest pain. EXAM: PORTABLE CHEST 1 VIEW COMPARISON:  None. FINDINGS: The heart size and mediastinal contours are within normal limits. Both lungs are clear. The visualized skeletal structures are  unremarkable. IMPRESSION: No active disease. Electronically Signed   By: Anner Crete M.D.   On: 03/21/2019 19:16   ECHOCARDIOGRAM COMPLETE  Result Date: 03/22/2019    ECHOCARDIOGRAM REPORT   Patient Name:   Max Pittman Date of Exam: 03/22/2019 Medical Rec #:  [id number]   Height:       69.0 in Accession #:    [id number]  Weight:       290.0 lb Date of Birth:  1975-06-25   BSA:          2.419 m Patient Age:    34 years    BP:           112/75 mmHg Patient Gender: M           HR:           98 bpm. Exam Location:  Inpatient Procedure: 2D Echo and Intracardiac Opacification Agent Indications:    Chest Pain 786.50 / R07.9  History:        Patient has no prior history of Echocardiogram examinations.                 STEMI and Angina, Stroke; Risk Factors:Hypertension and                 Diabetes. End stage renal disease.  Sonographer:    Darlina Sicilian RDCS Referring Phys: [phone] Leanne Chang [facility]  Sonographer Comments: Technically challenging study due to limited acoustic windows. Image acquisition challenging due to patient body habitus. IMPRESSIONS  1. Left ventricular ejection fraction, by estimation, is 60 to 65%. The left ventricle has normal function. The left ventricle has no regional wall motion abnormalities. Indeterminate diastolic filling due to E-A fusion.  2. Right ventricular systolic function is normal. The right ventricular size is normal. Tricuspid regurgitation signal is inadequate for assessing PA pressure.  3. The mitral valve is grossly normal. Trivial mitral valve regurgitation. No evidence of mitral stenosis.  4. The aortic valve is grossly normal. Aortic valve regurgitation is not visualized. No aortic stenosis is present. FINDINGS  Left Ventricle: Left ventricular ejection fraction, by estimation, is 60 to 65%. The left ventricle has normal function. The left ventricle has no regional wall motion abnormalities. Definity contrast agent was given IV to delineate the left ventricular   endocardial borders. The left ventricular internal cavity size was normal in size. There is no left ventricular hypertrophy. Indeterminate diastolic filling due to E-A fusion. Right Ventricle: The right ventricular size is normal. No increase in right ventricular wall thickness. Right ventricular systolic function is normal. Tricuspid regurgitation signal is inadequate for assessing PA pressure. Left Atrium: Left atrial size was normal in size. Right Atrium:  Right atrial size was normal in size. Pericardium: Trivial pericardial effusion is present. Presence of pericardial fat pad. Mitral Valve: The mitral valve is grossly normal. Trivial mitral valve regurgitation. No evidence of mitral valve stenosis. Tricuspid Valve: The tricuspid valve is grossly normal. Tricuspid valve regurgitation is not demonstrated. Aortic Valve: The aortic valve is grossly normal. Aortic valve regurgitation is not visualized. No aortic stenosis is present. Pulmonic Valve: The pulmonic valve was grossly normal. Pulmonic valve regurgitation is not visualized. Aorta: The aortic root and ascending aorta are structurally normal, with no evidence of dilitation. Venous: The inferior vena cava was not well visualized. IAS/Shunts: The interatrial septum was not well visualized.  LEFT VENTRICLE PLAX 2D LVIDd:         3.80 cm  Diastology LVIDs:         2.60 cm  LV e' lateral:   9.36 cm/s LV PW:         0.90 cm  LV E/e' lateral: 9.3 LV IVS:        1.30 cm  LV e' medial:    7.51 cm/s LVOT diam:     2.20 cm  LV E/e' medial:  11.6 LV SV:         47 LV SV Index:   19 LVOT Area:     3.80 cm  RIGHT VENTRICLE RV S prime:     17.10 cm/s TAPSE (M-mode): 1.7 cm LEFT ATRIUM             Index LA diam:        3.40 cm 1.41 cm/m LA Vol (A2C):   38.7 ml 16.00 ml/m LA Vol (A4C):   34.3 ml 14.18 ml/m LA Biplane Vol: 37.1 ml 15.33 ml/m  AORTIC VALVE LVOT Vmax:   86.70 cm/s LVOT Vmean:  54.700 cm/s LVOT VTI:    0.123 m  AORTA Ao Root diam: 2.90 cm MITRAL VALVE MV  Area (PHT): 3.85 cm     SHUNTS MV Decel Time: 197 msec     Systemic VTI:  0.12 m MV E velocity: 86.80 cm/s   Systemic Diam: 2.20 cm MV A velocity: 105.00 cm/s MV E/A ratio:  0.83 Eleonore Chiquito MD Electronically signed by Eleonore Chiquito MD Signature Date/Time: 03/22/2019/3:38:46 PM    Final     Cardiac Studies   Cath: 03/22/19   Mid LAD lesion is 20% stenosed.  The left ventricular systolic function is normal.  LV end diastolic pressure is mildly elevated.  Prox RCA lesion is 40% stenosed.   1.  Moderate one-vessel coronary artery disease involving the proximal right coronary artery.  Significant spasm noted in the ostial proximal RCA improved significantly with intracoronary nitroglycerin.  No other obstructive disease noted. 2.  Normal LV systolic function and mildly elevated left ventricular end-diastolic pressure.  Recommendations: No clear culprit is identified for non-ST elevation myocardial infarction.  Right coronary spasm is a possibility.  Treating this might be difficult given the patient's recurrent syncope and hypotension.  I am going to add small dose Imdur 15 mg daily. Continue aggressive treatment of risk factors.   Diagnostic Dominance: Right   TTE: 03/22/19  IMPRESSIONS    1. Left ventricular ejection fraction, by estimation, is 60 to 65%. The  left ventricle has normal function. The left ventricle has no regional  wall motion abnormalities. Indeterminate diastolic filling due to E-A  fusion.  2. Right ventricular systolic function is normal. The right ventricular  size is normal. Tricuspid regurgitation signal is inadequate for  assessing  PA pressure.  3. The mitral valve is grossly normal. Trivial mitral valve  regurgitation. No evidence of mitral stenosis.  4. The aortic valve is grossly normal. Aortic valve regurgitation is not  visualized. No aortic stenosis is present.   Patient Profile     44 y.o. 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   Assessment & Plan    1. NSTEMI: Patient presented with multiple episodes of syncope over the last several days (hx of vasovagal syncope and hypotension) and chest pain that began on Sunday as pressure across the lower chest and has been constant since. CVD risk factors include hypertension, diabetes, hyperlipidemia, obesity. -Troponins peaked at 3491 and EKG with diffuse ST changes. -Underwent cardiac cath noted above with no culprit lesion noted, felt symptoms could be related to coronary spasm. Recommendation for low dose Imdur post cath as his blood pressures have been soft. More stable today. Will attempt to add - denies any recent viral illness, or worsening of symptoms with position changes. No evidence of pericarditis  2. History vasovagal syncope: Patient notes that he usually has syncope with using the bathroom, a couple of times a year. No arrhythmias noted on telemetry. Echo noted normal EF with no rWMA.   3. Hypokalemia -K+ 3.0 on presentation. Pt was given supplemental potassium.  Down to 2.7 this morning. Supplement ordered via primary.  4. 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 Was given 1L bolus last evening.   5. Thrombocytopenia: Platelets 101>>73.   6. Hypertension: Patient was hypotensive on presentation down to 85/41.  Home carvedilol and hydralazine have been held for about the last 2 weeks due to low BP.  -Blood pressure is currently stable  With ongoing orthostatic issues, is not unreasonable to continue off of current medications.  If BP were to improve, would probably consider low-dose calcium channel blocker as opposed to hydralazine for vasodilation given concern for spasm.  7. Diabetes type 2 on insulin: A1c 6.6, adequate control. -Management Per primary team -Home Lantus, Actos and Trulicity on hold.  8. History of  stroke: Reportedly at age 35 with mild residual left-sided weakness.  9. Hyperlipidemia: On atorvastatin 20 mg daily at home, would continue the same. -LDL is 53.   For questions or updates, please contact [place] Please consult [email or web address] for contact info under    Signed, Reino Bellis, NP  03/23/2019, 10:04 AM    ATTENDING ATTESTATION  I have seen, examined and evaluated the patient this AM on rounds along with Reino Bellis, NP-C.  After reviewing all the available data and chart, we discussed the patients laboratory, study & physical findings as well as symptoms in detail. I agree with her findings, examination as well as impression recommendations as per our discussion.    Attending adjustments noted in italics.   Surprising results of the cardiac catheterization yesterday.  No obvious lesion besides may be some spasm of the RCA.  As such, we have started low-dose Imdur.  Would like to try to see if we can get up to 30 mg as this was the only potential etiology.  Troponin of 3000 may very well be related to his end-stage renal disease with some mild spasm related infarct, however cannot exclude an arrhythmia.  It does not seem like he has had any arrhythmias here in the hospital.  If his  near syncopal episodes continue would probably need to consider 14 or 30-day event monitor in the outpatient setting.  He indicates that he would be easier to follow-up in [place].    [facility] will sign off.   Medication Recommendations: Continue with current dose of Imdur entirely titrate to 30 mg nightly as tolerated. Other recommendations (labs, testing, etc): If syncopal type episodes continue after discharge, would consider 14 to 30-day event monitor. Follow up as an outpatient: We will arrange follow-up in [place] office. Glenetta Hew, M.D., M.S. Interventional Cardiologist   Pager # [phone] Phone # [phone] [address] [place], [place] [id number]

## 2019-03-23 NOTE — Plan of Care (Signed)

## 2019-03-24 LAB — CBC
HCT: 42.8 % (ref 39.0–52.0)
Hemoglobin: 14.4 g/dL (ref 13.0–17.0)
MCH: 30.4 pg (ref 26.0–34.0)
MCHC: 33.6 g/dL (ref 30.0–36.0)
MCV: 90.5 fL (ref 80.0–100.0)
Platelets: 74 10*3/uL — ABNORMAL LOW (ref 150–400)
RBC: 4.73 MIL/uL (ref 4.22–5.81)
RDW: 16.5 % — ABNORMAL HIGH (ref 11.5–15.5)
WBC: 7.4 10*3/uL (ref 4.0–10.5)
nRBC: 0 % (ref 0.0–0.2)

## 2019-03-24 LAB — BASIC METABOLIC PANEL
Anion gap: 16 — ABNORMAL HIGH (ref 5–15)
BUN: 57 mg/dL — ABNORMAL HIGH (ref 6–20)
CO2: 20 mmol/L — ABNORMAL LOW (ref 22–32)
Calcium: 8.8 mg/dL — ABNORMAL LOW (ref 8.9–10.3)
Chloride: 100 mmol/L (ref 98–111)
Creatinine, Ser: 14.27 mg/dL — ABNORMAL HIGH (ref 0.61–1.24)
GFR calc Af Amer: 4 mL/min — ABNORMAL LOW (ref >60)
GFR calc non Af Amer: 4 mL/min — ABNORMAL LOW (ref >60)
Glucose, Bld: 152 mg/dL — ABNORMAL HIGH (ref 70–99)
Potassium: 2.8 mmol/L — ABNORMAL LOW (ref 3.5–5.1)
Sodium: 136 mmol/L (ref 135–145)

## 2019-03-24 LAB — GLUCOSE, CAPILLARY: Glucose-Capillary: 132 mg/dL — ABNORMAL HIGH (ref 70–99)

## 2019-03-24 LAB — HEPATITIS B SURFACE ANTIGEN: Hepatitis B Surface Ag: NONREACTIVE

## 2019-03-24 MED ORDER — CARVEDILOL 3.125 MG PO TABS
3.1250 mg | ORAL_TABLET | Freq: Two times a day (BID) | ORAL | Status: DC
  Administered 2019-03-24: 3.125 mg via ORAL
  Filled 2019-03-24: qty 1

## 2019-03-24 MED ORDER — ATORVASTATIN CALCIUM 40 MG PO TABS: 40.0000 mg | ORAL_TABLET | Freq: Every day | ORAL | 6 refills | Status: DC

## 2019-03-24 MED ORDER — CARVEDILOL 3.125 MG PO TABS: 3.1250 mg | ORAL_TABLET | Freq: Two times a day (BID) | ORAL | 6 refills | Status: DC

## 2019-03-24 MED ORDER — ISOSORBIDE MONONITRATE ER 30 MG PO TB24: 15.0000 mg | ORAL_TABLET | Freq: Every day | ORAL | 6 refills | Status: DC

## 2019-03-24 NOTE — Discharge Summary (Addendum)
Physician Discharge Summary  Cloyd Volcy [id number] DOB: 12-04-1975 DOA: 03/21/2019  PCP: Chesley Noon, MD  Admit date: 03/21/2019 Discharge date: 03/24/2019  Admitted From: Home Disposition:  Home  Recommendations for Outpatient Follow-up:  1. Follow up with PCP in 1-2 weeks 2. Please obtain BMP/CBC in one week   Home Health:No Equipment/Devices:None  Discharge Condition:Stable CODE STATUS:Full Diet recommendation: Heart Healthy  Brief/Interim Summary: 44 y.o. male past medical history of end-stage renal disease on peritoneal dialysis, CVA hypertension diabetes mellitus type 2 came into the hospital with several episodes of chest pain and syncope markers were elevated cardiology was consulted  Discharge Diagnoses:  Principal Problem:   Syncope Active Problems:   Chest pain   Peritoneal dialysis status ([place])   Stroke ([place])   Diabetes mellitus ([place])   NSTEMI (non-ST elevated myocardial infarction) ([place]) Syncope hypertension questionably due to NSTEMI in the setting of possible vasa specimens: His antihypertensive medications were held due to hypotension, cardiac markers were significantly elevated and doubled when recheck EKG showed diffuse ST segment changes and abnormal R wave progression, cardiology was consulted recommended to start IV heparin aspirin and nitroglycerin for chest pain.  His Coreg was held cath was performed on 03/22/2019 that showed moderate 1 single-vessel disease and significant spasm in RCA. Cardiology recommended to start Imdur case with renal and they recommended to stop all other antihypertensive medications besides Imdur.  End-stage renal disease on peritoneal dialysis: Renal was consulted he was continued on peritoneal dialysis.  Thrombocytopenia: His platelet count had had a mild drop likely in the setting of due to infection and has remained stable and slightly trending up we will follow-up with with his PCP and check a CBC to monitor  platelets.  Hypokalemia: Is being managed by peritoneal dialysis is slowly trending up.  Diabetes mellitus type 2: No changes made.  Essential hypertension: Hydralazine was DC'd, he was continued on Coreg Imdur was added now he will continue his oral Lasix.   Discharge Instructions  Discharge Instructions    AMB Referral to Cardiac Rehabilitation - Phase II   Complete by: As directed    Diagnosis: NSTEMI   After initial evaluation and assessments completed: Virtual Based Care may be provided alone or in conjunction with Phase 2 Cardiac Rehab based on patient barriers.: Yes   Diet - low sodium heart healthy   Complete by: As directed    Diet - low sodium heart healthy   Complete by: As directed    Increase activity slowly   Complete by: As directed    Increase activity slowly   Complete by: As directed      Allergies as of 03/24/2019      Reactions   Doxycycline Hives   Latex Swelling   Pt reports swelling at site.   Morphine And Related Anxiety      Medication List    STOP taking these medications   hydrALAZINE 25 MG tablet Commonly known as: APRESOLINE   hydrOXYzine 25 MG tablet Commonly known as: ATARAX/VISTARIL     TAKE these medications   atorvastatin 40 MG tablet Commonly known as: LIPITOR Take 1 tablet (40 mg total) by mouth daily at 6 PM. What changed:   medication strength  how much to take  when to take this   Basaglar KwikPen 100 UNIT/ML Sopn Inject 22 Units into the skin every evening.   buPROPion 150 MG 12 hr tablet Commonly known as: WELLBUTRIN SR Take 150 mg by mouth 2 (two) times daily.  carvedilol 3.125 MG tablet Commonly known as: COREG Take 1 tablet (3.125 mg total) by mouth 2 (two) times daily with a meal. What changed:   medication strength  how much to take  when to take this   DULoxetine 60 MG capsule Commonly known as: CYMBALTA Take 60 mg by mouth 2 (two) times daily.   furosemide 40 MG tablet Commonly known as:  LASIX Take 120 mg by mouth in the morning and at bedtime.   gentamicin cream 0.1 % Commonly known as: GARAMYCIN Apply 1 application topically every other day. Applied to catheter post cleaning   HYDROcodone-acetaminophen 5-325 MG tablet Commonly known as: NORCO/VICODIN Take 1 tablet by mouth every 8 (eight) hours as needed for moderate pain.   isosorbide mononitrate 30 MG 24 hr tablet Commonly known as: IMDUR Take 0.5 tablets (15 mg total) by mouth daily.   pioglitazone 45 MG tablet Commonly known as: ACTOS Take 45 mg by mouth daily.   pregabalin 100 MG capsule Commonly known as: LYRICA Take 100 mg by mouth 3 (three) times daily.   Renvela 800 MG tablet Generic drug: sevelamer carbonate Take 1,600-3,200 mg by mouth See admin instructions. Take 4 tablets (3200mg  total)  by mouth with meals three times daily and take 2 tablets (1600mg  total) with snacks   tiZANidine 4 MG tablet Commonly known as: ZANAFLEX Take 4 mg by mouth 3 (three) times daily.   Trulicity 1.5 0000000 Sopn Generic drug: Dulaglutide Inject 1.5 mg into the skin every Tuesday.       Allergies  Allergen Reactions  . Doxycycline Hives  . Latex Swelling    Pt reports swelling at site.   . Morphine And Related Anxiety    Consultations:  None   Procedures/Studies: CARDIAC CATHETERIZATION  Result Date: 03/22/2019  Mid LAD lesion is 20% stenosed.  The left ventricular systolic function is normal.  LV end diastolic pressure is mildly elevated.  Prox RCA lesion is 40% stenosed.  1.  Moderate one-vessel coronary artery disease involving the proximal right coronary artery.  Significant spasm noted in the ostial proximal RCA improved significantly with intracoronary nitroglycerin.  No other obstructive disease noted. 2.  Normal LV systolic function and mildly elevated left ventricular end-diastolic pressure. Recommendations: No clear culprit is identified for non-ST elevation myocardial infarction.  Right  coronary spasm is a possibility.  Treating this might be difficult given the patient's recurrent syncope and hypotension.  I am going to add small dose Imdur 15 mg daily. Continue aggressive treatment of risk factors.   DG Chest Port 1 View  Result Date: 03/21/2019 CLINICAL DATA:  44 year old male with chest pain. EXAM: PORTABLE CHEST 1 VIEW COMPARISON:  None. FINDINGS: The heart size and mediastinal contours are within normal limits. Both lungs are clear. The visualized skeletal structures are unremarkable. IMPRESSION: No active disease. Electronically Signed   By: Anner Crete M.D.   On: 03/21/2019 19:16   ECHOCARDIOGRAM COMPLETE  Result Date: 03/22/2019    ECHOCARDIOGRAM REPORT   Patient Name:   RAMAN BLYDEN Date of Exam: 03/22/2019 Medical Rec #:  [id number]   Height:       69.0 in Accession #:    [id number]  Weight:       290.0 lb Date of Birth:  02-04-1975   BSA:          2.419 m Patient Age:    43 years    BP:           112/75 mmHg Patient Gender:  M           HR:           98 bpm. Exam Location:  Inpatient Procedure: 2D Echo and Intracardiac Opacification Agent Indications:    Chest Pain 786.50 / R07.9  History:        Patient has no prior history of Echocardiogram examinations.                 STEMI and Angina, Stroke; Risk Factors:Hypertension and                 Diabetes. End stage renal disease.  Sonographer:    Darlina Sicilian RDCS Referring Phys: [phone] Leanne Chang [facility]  Sonographer Comments: Technically challenging study due to limited acoustic windows. Image acquisition challenging due to patient body habitus. IMPRESSIONS  1. Left ventricular ejection fraction, by estimation, is 60 to 65%. The left ventricle has normal function. The left ventricle has no regional wall motion abnormalities. Indeterminate diastolic filling due to E-A fusion.  2. Right ventricular systolic function is normal. The right ventricular size is normal. Tricuspid regurgitation signal is inadequate for assessing PA  pressure.  3. The mitral valve is grossly normal. Trivial mitral valve regurgitation. No evidence of mitral stenosis.  4. The aortic valve is grossly normal. Aortic valve regurgitation is not visualized. No aortic stenosis is present. FINDINGS  Left Ventricle: Left ventricular ejection fraction, by estimation, is 60 to 65%. The left ventricle has normal function. The left ventricle has no regional wall motion abnormalities. Definity contrast agent was given IV to delineate the left ventricular  endocardial borders. The left ventricular internal cavity size was normal in size. There is no left ventricular hypertrophy. Indeterminate diastolic filling due to E-A fusion. Right Ventricle: The right ventricular size is normal. No increase in right ventricular wall thickness. Right ventricular systolic function is normal. Tricuspid regurgitation signal is inadequate for assessing PA pressure. Left Atrium: Left atrial size was normal in size. Right Atrium: Right atrial size was normal in size. Pericardium: Trivial pericardial effusion is present. Presence of pericardial fat pad. Mitral Valve: The mitral valve is grossly normal. Trivial mitral valve regurgitation. No evidence of mitral valve stenosis. Tricuspid Valve: The tricuspid valve is grossly normal. Tricuspid valve regurgitation is not demonstrated. Aortic Valve: The aortic valve is grossly normal. Aortic valve regurgitation is not visualized. No aortic stenosis is present. Pulmonic Valve: The pulmonic valve was grossly normal. Pulmonic valve regurgitation is not visualized. Aorta: The aortic root and ascending aorta are structurally normal, with no evidence of dilitation. Venous: The inferior vena cava was not well visualized. IAS/Shunts: The interatrial septum was not well visualized.  LEFT VENTRICLE PLAX 2D LVIDd:         3.80 cm  Diastology LVIDs:         2.60 cm  LV e' lateral:   9.36 cm/s LV PW:         0.90 cm  LV E/e' lateral: 9.3 LV IVS:        1.30 cm  LV e'  medial:    7.51 cm/s LVOT diam:     2.20 cm  LV E/e' medial:  11.6 LV SV:         47 LV SV Index:   19 LVOT Area:     3.80 cm  RIGHT VENTRICLE RV S prime:     17.10 cm/s TAPSE (M-mode): 1.7 cm LEFT ATRIUM             Index  LA diam:        3.40 cm 1.41 cm/m LA Vol (A2C):   38.7 ml 16.00 ml/m LA Vol (A4C):   34.3 ml 14.18 ml/m LA Biplane Vol: 37.1 ml 15.33 ml/m  AORTIC VALVE LVOT Vmax:   86.70 cm/s LVOT Vmean:  54.700 cm/s LVOT VTI:    0.123 m  AORTA Ao Root diam: 2.90 cm MITRAL VALVE MV Area (PHT): 3.85 cm     SHUNTS MV Decel Time: 197 msec     Systemic VTI:  0.12 m MV E velocity: 86.80 cm/s   Systemic Diam: 2.20 cm MV A velocity: 105.00 cm/s MV E/A ratio:  0.83 Eleonore Chiquito MD Electronically signed by Eleonore Chiquito MD Signature Date/Time: 03/22/2019/3:38:46 PM    Final      Subjective: No complains  Discharge Exam: Vitals:   03/24/19 0021 03/24/19 0358  BP: (!) 102/59 111/74  Pulse: 91 88  Resp: 18 20  Temp: 97.9 F (36.6 C) 98 F (36.7 C)  SpO2: 93% 98%   Vitals:   03/23/19 2210 03/24/19 0021 03/24/19 0151 03/24/19 0358  BP: 121/69 (!) 102/59  111/74  Pulse: 94 91  88  Resp: 18 18  20   Temp: 98 F (36.7 C) 97.9 F (36.6 C)  98 F (36.7 C)  TempSrc: Oral Oral  Oral  SpO2: 100% 93%  98%  Weight:   131.2 kg   Height:        General: Pt is alert, awake, not in acute distress Cardiovascular: RRR, S1/S2 +, no rubs, no gallops Respiratory: CTA bilaterally, no wheezing, no rhonchi Abdominal: Soft, NT, ND, bowel sounds + Extremities: no edema, no cyanosis    The results of significant diagnostics from this hospitalization (including imaging, microbiology, ancillary and laboratory) are listed below for reference.     Microbiology: Recent Results (from the past 240 hour(s))  Respiratory Panel by RT PCR (Flu A&B, Covid) - Nasopharyngeal Swab     Status: None   Collection Time: 03/21/19  8:51 PM   Specimen: Nasopharyngeal Swab  Result Value Ref Range Status   SARS  Coronavirus 2 by RT PCR NEGATIVE NEGATIVE Final    Comment: (NOTE) SARS-CoV-2 target nucleic acids are NOT DETECTED. The SARS-CoV-2 RNA is generally detectable in upper respiratoy specimens during the acute phase of infection. The lowest concentration of SARS-CoV-2 viral copies this assay can detect is 131 copies/mL. A negative result does not preclude SARS-Cov-2 infection and should not be used as the sole basis for treatment or other patient management decisions. A negative result may occur with  improper specimen collection/handling, submission of specimen other than nasopharyngeal swab, presence of viral mutation(s) within the areas targeted by this assay, and inadequate number of viral copies (<131 copies/mL). A negative result must be combined with clinical observations, patient history, and epidemiological information. The expected result is Negative. Fact Sheet for Patients:  PinkCheek.be Fact Sheet for Healthcare Providers:  GravelBags.it This test is not yet ap proved or cleared by the [place] FDA and  has been authorized for detection and/or diagnosis of SARS-CoV-2 by FDA under an Emergency Use Authorization (EUA). This EUA will remain  in effect (meaning this test can be used) for the duration of the COVID-19 declaration under Section 564(b)(1) of the Act, 21 U.S.C. section 360bbb-3(b)(1), unless the authorization is terminated or revoked sooner.    Influenza A by PCR NEGATIVE NEGATIVE Final   Influenza B by PCR NEGATIVE NEGATIVE Final    Comment: (NOTE) The Xpert  Xpress SARS-CoV-2/FLU/RSV assay is intended as an aid in  the diagnosis of influenza from Nasopharyngeal swab specimens and  should not be used as a sole basis for treatment. Nasal washings and  aspirates are unacceptable for Xpert Xpress SARS-CoV-2/FLU/RSV  testing. Fact Sheet for Patients: PinkCheek.be Fact Sheet  for Healthcare Providers: GravelBags.it This test is not yet approved or cleared by the [place] FDA and  has been authorized for detection and/or diagnosis of SARS-CoV-2 by  FDA under an Emergency Use Authorization (EUA). This EUA will remain  in effect (meaning this test can be used) for the duration of the  Covid-19 declaration under Section 564(b)(1) of the Act, 21  U.S.C. section 360bbb-3(b)(1), unless the authorization is  terminated or revoked. Performed at [facility], [address] [place], [place] [address]      Labs: BNP (last 3 results) No results for input(s): BNP in the last 8760 hours. Basic Metabolic Panel: Recent Labs  Lab 03/21/19 1815 03/22/19 0741 03/23/19 0515 03/24/19 0504  NA 134* 137 136 136  K 3.0* 3.0* 2.7* 2.8*  CL 92* 97* 100 100  CO2 24 20* 19* 20*  GLUCOSE 158* 109* 126* 152*  BUN 58* 61* 58* 57*  CREATININE 15.03* 15.73* 14.65* 14.27*  CALCIUM 10.2 9.8 8.8* 8.8*  MG 1.8  --   --   --    Liver Function Tests: No results for input(s): AST, ALT, ALKPHOS, BILITOT, PROT, ALBUMIN in the last 168 hours. No results for input(s): LIPASE, AMYLASE in the last 168 hours. No results for input(s): AMMONIA in the last 168 hours. CBC: Recent Labs  Lab 03/21/19 1815 03/23/19 0515 03/24/19 0504  WBC 10.2 7.7 7.4  NEUTROABS 6.3  --   --   HGB 18.8* 15.1 14.4  HCT 57.5* 45.5 42.8  MCV 91.4 88.9 90.5  PLT 101* 73* 74*   Cardiac Enzymes: No results for input(s): CKTOTAL, CKMB, CKMBINDEX, TROPONINI in the last 168 hours. BNP: Invalid input(s): POCBNP CBG: Recent Labs  Lab 03/23/19 0810 03/23/19 1115 03/23/19 1615 03/23/19 2212 03/24/19 0559  GLUCAP 102* 179* 84 135* 132*   D-Dimer No results for input(s): DDIMER in the last 72 hours. Hgb A1c Recent Labs    03/21/19 1815  HGBA1C 6.6*   Lipid Profile Recent Labs    03/22/19 0741  CHOL 115  HDL 29*  LDLCALC 53  TRIG 165*  CHOLHDL 4.0   Thyroid  function studies No results for input(s): TSH, T4TOTAL, T3FREE, THYROIDAB in the last 72 hours.  Invalid input(s): FREET3 Anemia work up No results for input(s): VITAMINB12, FOLATE, FERRITIN, TIBC, IRON, RETICCTPCT in the last 72 hours. Urinalysis No results found for: COLORURINE, APPEARANCEUR, [place], [place], [place], [place], [place], [place], PROTEINUR, UROBILINOGEN, NITRITE, LEUKOCYTESUR Sepsis Labs Invalid input(s): PROCALCITONIN,  WBC,  LACTICIDVEN Microbiology Recent Results (from the past 240 hour(s))  Respiratory Panel by RT PCR (Flu A&B, Covid) - Nasopharyngeal Swab     Status: None   Collection Time: 03/21/19  8:51 PM   Specimen: Nasopharyngeal Swab  Result Value Ref Range Status   SARS Coronavirus 2 by RT PCR NEGATIVE NEGATIVE Final    Comment: (NOTE) SARS-CoV-2 target nucleic acids are NOT DETECTED. The SARS-CoV-2 RNA is generally detectable in upper respiratoy specimens during the acute phase of infection. The lowest concentration of SARS-CoV-2 viral copies this assay can detect is 131 copies/mL. A negative result does not preclude SARS-Cov-2 infection and should not be used as the sole basis for treatment or other  patient management decisions. A negative result may occur with  improper specimen collection/handling, submission of specimen other than nasopharyngeal swab, presence of viral mutation(s) within the areas targeted by this assay, and inadequate number of viral copies (<131 copies/mL). A negative result must be combined with clinical observations, patient history, and epidemiological information. The expected result is Negative. Fact Sheet for Patients:  PinkCheek.be Fact Sheet for Healthcare Providers:  GravelBags.it This test is not yet ap proved or cleared by the [place] FDA and  has been authorized for detection and/or diagnosis of SARS-CoV-2 by FDA under an Emergency Use  Authorization (EUA). This EUA will remain  in effect (meaning this test can be used) for the duration of the COVID-19 declaration under Section 564(b)(1) of the Act, 21 U.S.C. section 360bbb-3(b)(1), unless the authorization is terminated or revoked sooner.    Influenza A by PCR NEGATIVE NEGATIVE Final   Influenza B by PCR NEGATIVE NEGATIVE Final    Comment: (NOTE) The Xpert Xpress SARS-CoV-2/FLU/RSV assay is intended as an aid in  the diagnosis of influenza from Nasopharyngeal swab specimens and  should not be used as a sole basis for treatment. Nasal washings and  aspirates are unacceptable for Xpert Xpress SARS-CoV-2/FLU/RSV  testing. Fact Sheet for Patients: PinkCheek.be Fact Sheet for Healthcare Providers: GravelBags.it This test is not yet approved or cleared by the [place] FDA and  has been authorized for detection and/or diagnosis of SARS-CoV-2 by  FDA under an Emergency Use Authorization (EUA). This EUA will remain  in effect (meaning this test can be used) for the duration of the  Covid-19 declaration under Section 564(b)(1) of the Act, 21  U.S.C. section 360bbb-3(b)(1), unless the authorization is  terminated or revoked. Performed at [facility], [address] [place], [place] [address]      Time coordinating discharge: Over 40 minutes  SIGNED:   Charlynne Cousins, MD  [facility] 03/24/2019, 8:05 AM Pager   If 7PM-7AM, please contact night-coverage [email or web address] Password [email or web address]

## 2019-03-24 NOTE — Progress Notes (Signed)
[facility] Progress Note  Subjective: got 2nd liter NS overnight, weighed on scale at 128kg , prior edw 125 kg  Vitals:   03/24/19 0151 03/24/19 0358 03/24/19 0812 03/24/19 0826  BP:  111/74 121/69 107/77  Pulse:  88  97  Resp:  20  20  Temp:  98 F (36.7 C)  98.3 F (36.8 C)  TempSrc:  Oral  Oral  SpO2:  98%    Weight: 131.2 kg     Height:        Exam: Gen obese WM no distress No jvd or bruits Chest clear bilat to bases RRR no MRG Abd soft ntnd no mass or ascites +bs , PD cath LLQ GU normal male  MS no joint effusions or deformity Ext no sig LE edema, no wounds or ulcers Neuro is alert, Ox 3 , nf    Home meds:  - furosemide 120 bid/ carvedilol 25 bid/ hydralazine 25 tid  - turlicity 1.5 weekly/ pioglitazone 45 qd  - bupropion sr 150 bid/ duloxetine 60 bid/ hydrocodone prn/ pregablin 100 tid/ tizanidine 4mg  tid  - atorvastatin 20  - prn's/ vitamins/ supplements     Outpt HD: [facility]CP[facility]  Hangs 18L at night and does 3L day bag, 9.5 hr overnight , 5 exchanges, not sure dwell vol     Assessment/ Plan: 1. Chest pain / syncope/ Max Pittman - sp LHC , no sig CAD by cath. Getting nitrates now for cor artery spasm.  2. ESRD - on CCPD x 1 yr, getting closer to adequacy marker 1.7, last checked at 1.67 per pt. PD here all 1.5%.  3. Syncope -high Hb (18 > 15 after saline bolus) is a signal of sig dehydration. Hb 14 after 2 L saline boluses. Suspect passing out from hypotension related to vol depletion/ +BP meds. Have contacted pt's esrd provider w/ update to raise the dry wt due to gaining body wt (128kg today) and hold coreg/ hydralazine for now.  Pt to use all 1.5% fluids for now.  4. HTN/vol - holding home BP meds at dc , as above 5. DM2 - per primary 6. Hypokalemia - 2.7, po repletion ordered 40 x 2 (and had 33meq this am already). Only up to 2.8 today. They will f/u as OP w/ renal providers.  7. Anemia ckd - Hb 18 > 14 w/ IVF's, as above      Max Pittman  Max Pittman Max Pittman 03/24/2019, 12:46 PM   Recent Labs  Lab 03/23/19 0515 03/24/19 0504  K 2.7* 2.8*  BUN 58* 57*  CREATININE 14.65* 14.27*  CALCIUM 8.8* 8.8*   Inpatient medications: . aspirin  325 mg Oral Once  . atorvastatin  40 mg Oral q1800  . buPROPion  150 mg Oral BID  . carvedilol  3.125 mg Oral BID WC  . DULoxetine  60 mg Oral BID  . famotidine  10 mg Oral Daily  . gentamicin cream  1 application Topical Daily  . insulin aspart  0-9 Units Subcutaneous Q4H  . isosorbide mononitrate  15 mg Oral Daily  . pregabalin  100 mg Oral TID  . sevelamer carbonate  3,200 mg Oral TID WC    acetaminophen, HYDROcodone-acetaminophen, nitroGLYCERIN, ondansetron (ZOFRAN) IV, sevelamer carbonate, tiZANidine

## 2019-03-27 DIAGNOSIS — I82409 Acute embolism and thrombosis of unspecified deep veins of unspecified lower extremity: Secondary | ICD-10-CM

## 2019-03-27 HISTORY — DX: Acute embolism and thrombosis of unspecified deep veins of unspecified lower extremity: I82.409

## 2019-04-05 NOTE — Progress Notes (Signed)
Cardiology Office Note    Date:  04/10/2019   ID:  Max Pittman., DOB 1975/11/29, MRN [id number]  PCP:  Chesley Noon, MD  Cardiologist: No primary care provider on file. EPS: None  No chief complaint on file.   History of Present Illness:  Max Pittman. is a 44 y.o. 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  Patient was admitted with NSTEMI 2/24/21after presenting with multiple episodes of syncope and chest pain.  Troponins peaked at 3491 and EKG with diffuse ST changes.  Cardiac cath had no culprit lesions and felt symptoms could be related to coronary spasm.  He was placed on Imdur post-cath as blood pressures were very soft.  Home carvedilol and hydralazine had been held for 2 weeks prior to MI because of low blood pressures.  If blood pressure improves consider low-dose calcium channel blocker.  He had no arrhythmias on telemetry and echo with normal LVEF no wall motion abnormality.  Patient comes in for f/u. Complains of constant headache on Imdur. Had an episode of chest pain this weekend similar to hospitalization. Last off/on for a couple of hours. When it occurred it lasted 5-10 min, worse with exertion and then would ease. His dialysis has been adjusted but BP still running very low. He gets dizzy when he stand up.     Past Medical History:  Diagnosis Date  . Arthritis   . Diabetes ([place])   . History of peritoneal dialysis   . Hypertension   . Renal disorder    FFGS  . Stroke [facility])    when ha was a child  . Vasovagal syndrome    with syncope    Past Surgical History:  Procedure Laterality Date  . AV FISTULA INSERTION W/ RF MAGNETIC GUIDANCE Left   . CHOLECYSTECTOMY    . HERNIA REPAIR    . LEFT HEART CATH AND CORONARY ANGIOGRAPHY N/A 03/22/2019   Procedure: LEFT HEART CATH AND CORONARY ANGIOGRAPHY;  Surgeon: Wellington Hampshire,  MD;  Location: [place] CV [facility];  Service: Cardiovascular;  Laterality: N/A;  . TONSILLECTOMY      Current Medications: Current Meds  Medication Sig  . atorvastatin (LIPITOR) 40 MG tablet Take 1 tablet (40 mg total) by mouth daily at 6 PM.  . buPROPion (WELLBUTRIN SR) 150 MG 12 hr tablet Take 150 mg by mouth 2 (two) times daily.  . DULoxetine (CYMBALTA) 60 MG capsule Take 60 mg by mouth 2 (two) times daily.  Marland Kitchen HYDROcodone-acetaminophen (NORCO/VICODIN) 5-325 MG tablet Take 1 tablet by mouth every 8 (eight) hours as needed for moderate pain.   . Insulin Glargine (BASAGLAR KWIKPEN) 100 UNIT/ML SOPN Inject 22 Units into the skin every evening.   Marland Kitchen omeprazole (PRILOSEC) 20 MG capsule Take 20 mg by mouth daily.  . pioglitazone (ACTOS) 45 MG tablet Take 45 mg by mouth daily.  . pregabalin (LYRICA) 100 MG capsule Take 100 mg by mouth 3 (three) times daily.  Marland Kitchen RENVELA 800 MG tablet Take 1,600-3,200 mg by mouth See admin instructions. Take 4 tablets (3200mg  total)  by mouth with meals three times daily and take 2 tablets (1600mg  total) with snacks  . tiZANidine (ZANAFLEX) 4 MG tablet Take 4 mg by mouth 3 (three) times daily.  . TRULICITY 1.5 OX/7.3ZH SOPN Inject 1.5 mg into the skin every Tuesday.   . [DISCONTINUED] isosorbide mononitrate (IMDUR)  30 MG 24 hr tablet Take 0.5 tablets (15 mg total) by mouth daily.     Allergies:   Doxycycline, Latex, and Morphine and related   Social History   Socioeconomic History  . Marital status: Legally Separated    Spouse name: Not on file  . Number of children: Not on file  . Years of education: Not on file  . Highest education level: Not on file  Occupational History  . Not on file  Tobacco Use  . Smoking status: Former Smoker    Quit date: 11/16/2018    Years since quitting: 0.3  . Smokeless tobacco: Former Network engineer and Sexual Activity  . Alcohol use: Not on file    Comment: occ  . Drug use: Never  . Sexual activity: Not on file  Other  Topics Concern  . Not on file  Social History Narrative  . Not on file   Social Determinants of Health   Financial Resource Strain:   . Difficulty of Paying Living Expenses:   Food Insecurity:   . Worried About Charity fundraiser in the Last Year:   . Arboriculturist in the Last Year:   Transportation Needs:   . Film/video editor (Medical):   Marland Kitchen Lack of Transportation (Non-Medical):   Physical Activity:   . Days of Exercise per Week:   . Minutes of Exercise per Session:   Stress:   . Feeling of Stress :   Social Connections:   . Frequency of Communication with Friends and Family:   . Frequency of Social Gatherings with Friends and Family:   . Attends Religious Services:   . Active Member of Clubs or Organizations:   . Attends Archivist Meetings:   Marland Kitchen Marital Status:      Family History:  The patient's family history includes CAD in his mother; Diabetes in his father and mother; Healthy in his brother; Hypercholesterolemia in his father and mother; Hypertension in his father and mother.   ROS:   Please see the history of present illness.    ROS All other systems reviewed and are negative.   PHYSICAL EXAM:   VS:  BP (!) 88/60 (BP Location: Right Wrist, Cuff Size: Normal)   Pulse 100   Temp (!) 97.3 F (36.3 C)   Ht 5\' 9"  (1.753 m)   Wt 285 lb 3.2 oz (129.4 kg)   SpO2 93%   BMI 42.12 kg/m   Physical Exam  GEN: Obese, in no acute distress  Neck: no JVD, carotid bruits, or masses Cardiac:RRR; no murmurs, rubs, or gallops  Respiratory:  clear to auscultation bilaterally, normal work of breathing GI: soft, nontender, nondistended, + BS Ext: Cath site in right groin stable without hematoma or hemorrhage, good distal pulses, without cyanosis, clubbing, or edema, Good distal pulses bilaterally Neuro:  Alert and Oriented x 3 Psych: euthymic mood, full affect  Wt Readings from Last 3 Encounters:  04/10/19 285 lb 3.2 oz (129.4 kg)  03/24/19 289 lb 3.2 oz  (131.2 kg)      Studies/Labs Reviewed:   EKG:  EKG is not ordered today.  Recent Labs: 03/21/2019: Magnesium 1.8 03/24/2019: BUN 57; Creatinine, Ser 14.27; Hemoglobin 14.4; Platelets 74; Potassium 2.8; Sodium 136   Lipid Panel    Component Value Date/Time   CHOL 115 03/22/2019 0741   TRIG 165 (H) 03/22/2019 0741   HDL 29 (L) 03/22/2019 0741   CHOLHDL 4.0 03/22/2019 0741   VLDL 33 03/22/2019  [place] 53 03/22/2019 0741    Additional studies/ records that were reviewed today include:  Cath: 03/22/19    Mid LAD lesion is 20% stenosed.  The left ventricular systolic function is normal.  LV end diastolic pressure is mildly elevated.  Prox RCA lesion is 40% stenosed.   1.  Moderate one-vessel coronary artery disease involving the proximal right coronary artery.  Significant spasm noted in the ostial proximal RCA improved significantly with intracoronary nitroglycerin.  No other obstructive disease noted. 2.  Normal LV systolic function and mildly elevated left ventricular end-diastolic pressure.   Recommendations: No clear culprit is identified for non-ST elevation myocardial infarction.  Right coronary spasm is a possibility.  Treating this might be difficult given the patient's recurrent syncope and hypotension.  I am going to add small dose Imdur 15 mg daily. Continue aggressive treatment of risk factors.    Diagnostic Dominance: Right    TTE: 03/22/19   IMPRESSIONS     1. Left ventricular ejection fraction, by estimation, is 60 to 65%. The  left ventricle has normal function. The left ventricle has no regional  wall motion abnormalities. Indeterminate diastolic filling due to E-A  fusion.   2. Right ventricular systolic function is normal. The right ventricular  size is normal. Tricuspid regurgitation signal is inadequate for assessing  PA pressure.   3. The mitral valve is grossly normal. Trivial mitral valve  regurgitation. No evidence of mitral stenosis.     4. The aortic valve is grossly normal. Aortic valve regurgitation is not  visualized. No aortic stenosis is present.        ASSESSMENT:    1. Coronary artery disease involving native coronary artery of native heart without angina pectoris   2. Vasovagal syncope   3. Essential hypertension   4. Hyperlipidemia, unspecified hyperlipidemia type   5. Type 2 diabetes mellitus with other circulatory complication, without long-term current use of insulin ([place])   6. ESRD (end stage renal disease) on dialysis ([place])   7. Thrombocytopenia ([place])      PLAN:  In order of problems listed above:  NSTEMI 03/22/2019 secondary to coronary spasm.  Cardiac cath did not reveal any flow-limiting lesions echo no wall motion abnormalities.  Treatment limited because of hypotension.  Sent home on low-dose Imdur with hopes to start a low-dose calcium channel blocker if blood pressure improves.  Blood pressure remains low and has a constant headache on Imdur.  He also had chest pain over the weekend.  Will stop Imdur and try Ranexa 500 mg twice daily.  Follow-up with cardiologist in a couple weeks to get established here in [place].  History of vasovagal syncope with syncope on presentation and orthostasis.  Coreg and hydralazine were held 2 weeks prior to admission.  Blood pressure still quite low.  I wonder if he is getting too much fluid taken off during peritoneal dialysis.  I asked him to discuss this with his nephrologist.  Essential hypertension blood pressure too low to add any medications at this time  Hyperlipidemia on atorvastatin LDL 53  DM type II managed by PCP  End-stage renal disease due to FSGS on peritoneal dialysis  Thrombocytopenia platelets 73 at discharge    Medication Adjustments/Labs and Tests Ordered: Current medicines are reviewed at length with the patient today.  Concerns regarding medicines are outlined above.  Medication changes, Labs and Tests ordered today are listed in  the Patient Instructions below. Patient Instructions  Medication Instructions:   Stop  Imdur (Isosorbide).   Begin Ranexa 500mg  twice a day.   Continue all other medications.    Labwork: none  Testing/Procedures: none  Follow-Up: Within the next month.    Any Other Special Instructions Will Be Listed Below (If Applicable).  If you need a refill on your cardiac medications before your next appointment, please call your pharmacy.     Signed, Ermalinda Barrios, PA-C  04/10/2019 2:26 PM    [facility] [address], [place], [place]  [address] Phone: [phone]; Fax: [phone]

## 2019-04-10 ENCOUNTER — Other Ambulatory Visit: Payer: Self-pay

## 2019-04-10 ENCOUNTER — Ambulatory Visit (INDEPENDENT_AMBULATORY_CARE_PROVIDER_SITE_OTHER): Payer: Medicare Other | Admitting: Physician Assistant

## 2019-04-10 ENCOUNTER — Encounter: Payer: Self-pay | Admitting: Physician Assistant

## 2019-04-10 VITALS — BP 88/60 | HR 100 | Temp 97.3°F | Ht 69.0 in | Wt 285.2 lb

## 2019-04-10 DIAGNOSIS — E1159 Type 2 diabetes mellitus with other circulatory complications: Secondary | ICD-10-CM

## 2019-04-10 DIAGNOSIS — N186 End stage renal disease: Secondary | ICD-10-CM

## 2019-04-10 DIAGNOSIS — Z992 Dependence on renal dialysis: Secondary | ICD-10-CM

## 2019-04-10 DIAGNOSIS — E785 Hyperlipidemia, unspecified: Secondary | ICD-10-CM | POA: Diagnosis not present

## 2019-04-10 DIAGNOSIS — I2510 Atherosclerotic heart disease of native coronary artery without angina pectoris: Secondary | ICD-10-CM

## 2019-04-10 DIAGNOSIS — D696 Thrombocytopenia, unspecified: Secondary | ICD-10-CM

## 2019-04-10 DIAGNOSIS — I10 Essential (primary) hypertension: Secondary | ICD-10-CM | POA: Diagnosis not present

## 2019-04-10 DIAGNOSIS — R55 Syncope and collapse: Secondary | ICD-10-CM

## 2019-04-10 MED ORDER — RANOLAZINE ER 500 MG PO TB12: 500.0000 mg | ORAL_TABLET | Freq: Two times a day (BID) | ORAL | 6 refills | Status: DC

## 2019-04-10 NOTE — Patient Instructions (Signed)
Medication Instructions:   Stop Imdur (Isosorbide).   Begin Ranexa 500mg  twice a day.   Continue all other medications.    Labwork: none  Testing/Procedures: none  Follow-Up: Within the next month.    Any Other Special Instructions Will Be Listed Below (If Applicable).  If you need a refill on your cardiac medications before your next appointment, please call your pharmacy.

## 2019-04-17 DIAGNOSIS — S065XAA Traumatic subdural hemorrhage with loss of consciousness status unknown, initial encounter: Secondary | ICD-10-CM | POA: Insufficient documentation

## 2019-04-17 MED ORDER — SODIUM CHLORIDE FLUSH 0.9 % IV SOLN
10.00 | INTRAVENOUS | Status: DC
Start: 2019-04-17 — End: 2019-04-17

## 2019-04-17 MED ORDER — DIANEAL LOW CALCIUM/2.5% DEX 395 MOSM/L IP SOLN
5000.00 | INTRAPERITONEAL | Status: DC
Start: 2019-04-17 — End: 2019-04-17

## 2019-04-17 MED ORDER — NITROGLYCERIN 0.4 MG SL SUBL
0.40 | SUBLINGUAL_TABLET | SUBLINGUAL | Status: DC
Start: ? — End: 2019-04-17

## 2019-04-17 MED ORDER — DIANEAL LOW CALCIUM/2.5% DEX 395 MOSM/L IP SOLN
2000.00 | INTRAPERITONEAL | Status: DC
Start: 2019-04-18 — End: 2019-04-17

## 2019-04-17 MED ORDER — GENTAMICIN SULFATE 0.1 % EX CREA
TOPICAL_CREAM | CUTANEOUS | Status: DC
Start: 2019-04-18 — End: 2019-04-17

## 2019-04-17 MED ORDER — INSULIN LISPRO (1 UNIT DIAL) 100 UNIT/ML (KWIKPEN)
0.00 | PEN_INJECTOR | SUBCUTANEOUS | Status: DC
Start: 2019-04-17 — End: 2019-04-17

## 2019-04-17 MED ORDER — PANTOPRAZOLE SODIUM 40 MG PO TBEC
40.00 | DELAYED_RELEASE_TABLET | ORAL | Status: DC
Start: 2019-04-18 — End: 2019-04-17

## 2019-04-17 MED ORDER — ACETAMINOPHEN 325 MG PO TABS
650.00 | ORAL_TABLET | ORAL | Status: DC
Start: ? — End: 2019-04-17

## 2019-04-17 MED ORDER — HYDRALAZINE HCL 50 MG PO TABS
25.00 | ORAL_TABLET | ORAL | Status: DC
Start: 2019-04-17 — End: 2019-04-17

## 2019-04-17 MED ORDER — DEXTROSE 50 % IV SOLN
12.50 | INTRAVENOUS | Status: DC
Start: ? — End: 2019-04-17

## 2019-04-17 MED ORDER — GENERIC EXTERNAL MEDICATION
250.00 | Status: DC
Start: 2019-04-18 — End: 2019-04-17

## 2019-04-17 MED ORDER — SEVELAMER CARBONATE 800 MG PO TABS
1600.00 | ORAL_TABLET | ORAL | Status: DC
Start: 2019-04-17 — End: 2019-04-17

## 2019-04-17 MED ORDER — POTASSIUM CHLORIDE CRYS ER 20 MEQ PO TBCR
40.00 | EXTENDED_RELEASE_TABLET | ORAL | Status: DC
Start: ? — End: 2019-04-17

## 2019-04-17 MED ORDER — GLUCAGON (RDNA) 1 MG IJ KIT
1.00 | PACK | INTRAMUSCULAR | Status: DC
Start: ? — End: 2019-04-17

## 2019-04-17 MED ORDER — GLUCOSE-VITAMIN C 4-6 GM-MG PO CHEW
16.00 | CHEWABLE_TABLET | ORAL | Status: DC
Start: ? — End: 2019-04-17

## 2019-04-17 MED ORDER — ATORVASTATIN CALCIUM 10 MG PO TABS
20.00 | ORAL_TABLET | ORAL | Status: DC
Start: 2019-04-18 — End: 2019-04-17

## 2019-04-17 MED ORDER — CARVEDILOL 25 MG PO TABS
25.00 | ORAL_TABLET | ORAL | Status: DC
Start: 2019-04-17 — End: 2019-04-17

## 2019-05-03 ENCOUNTER — Encounter (HOSPITAL_COMMUNITY): Payer: Medicare Other

## 2019-05-12 ENCOUNTER — Telehealth: Payer: Self-pay

## 2019-05-12 ENCOUNTER — Other Ambulatory Visit: Payer: Self-pay

## 2019-05-12 ENCOUNTER — Encounter: Payer: Self-pay | Admitting: Cardiovascular Disease

## 2019-05-12 ENCOUNTER — Telehealth (INDEPENDENT_AMBULATORY_CARE_PROVIDER_SITE_OTHER): Payer: Medicare Other | Admitting: Cardiovascular Disease

## 2019-05-12 VITALS — BP 133/98 | HR 87 | Ht 69.0 in | Wt 276.7 lb

## 2019-05-12 DIAGNOSIS — F17201 Nicotine dependence, unspecified, in remission: Secondary | ICD-10-CM

## 2019-05-12 DIAGNOSIS — N186 End stage renal disease: Secondary | ICD-10-CM | POA: Diagnosis not present

## 2019-05-12 DIAGNOSIS — E785 Hyperlipidemia, unspecified: Secondary | ICD-10-CM | POA: Diagnosis not present

## 2019-05-12 DIAGNOSIS — I25111 Atherosclerotic heart disease of native coronary artery with angina pectoris with documented spasm: Secondary | ICD-10-CM | POA: Diagnosis not present

## 2019-05-12 DIAGNOSIS — I10 Essential (primary) hypertension: Secondary | ICD-10-CM

## 2019-05-12 DIAGNOSIS — I120 Hypertensive chronic kidney disease with stage 5 chronic kidney disease or end stage renal disease: Secondary | ICD-10-CM | POA: Diagnosis not present

## 2019-05-12 DIAGNOSIS — I201 Angina pectoris with documented spasm: Secondary | ICD-10-CM

## 2019-05-12 DIAGNOSIS — I2510 Atherosclerotic heart disease of native coronary artery without angina pectoris: Secondary | ICD-10-CM

## 2019-05-12 DIAGNOSIS — Z992 Dependence on renal dialysis: Secondary | ICD-10-CM

## 2019-05-12 MED ORDER — AMLODIPINE BESYLATE 2.5 MG PO TABS: 2.5000 mg | ORAL_TABLET | Freq: Every day | ORAL | 3 refills | Status: DC

## 2019-05-12 NOTE — Addendum Note (Signed)
Addended by: Barbarann Ehlers A on: 05/12/2019 02:07 PM   Modules accepted: Orders

## 2019-05-12 NOTE — Patient Instructions (Signed)
Medication Instructions:  START Amlodipine 2.5 mg daily   *If you need a refill on your cardiac medications before your next appointment, please call your pharmacy*   Lab Work: None today If you have labs (blood work) drawn today and your tests are completely normal, you will receive your results only by: Marland Kitchen MyChart Message (if you have MyChart) OR . A paper copy in the mail If you have any lab test that is abnormal or we need to change your treatment, we will call you to review the results.   Testing/Procedures: None today   Follow-Up: At [facility], you and your health needs are our priority.  As part of our continuing mission to provide you with exceptional heart care, we have created designated Provider Care Teams.  These Care Teams include your primary Cardiologist (physician) and Advanced Practice Providers (APPs -  Physician Assistants and Nurse Practitioners) who all work together to provide you with the care you need, when you need it.  We recommend signing up for the patient portal called "MyChart".  Sign up information is provided on this After Visit Summary.  MyChart is used to connect with patients for Virtual Visits (Telemedicine).  Patients are able to view lab/test results, encounter notes, upcoming appointments, etc.  Non-urgent messages can be sent to your provider as well.   To learn more about what you can do with MyChart, go to [email or web address].    Your next appointment:   6 month(s)  The format for your next appointment:   Virtual Visit   Provider:   Kate Sable, MD   Other Instructions None       Thank you for choosing [facility] !

## 2019-05-12 NOTE — Telephone Encounter (Signed)
  Patient Consent for Virtual Visit         Max Pittman. has provided verbal consent on 05/12/2019 for a virtual visit (video or telephone).   CONSENT FOR VIRTUAL VISIT FOR:  Max Pittman.  By participating in this virtual visit I agree to the following:  I hereby voluntarily request, consent and authorize [facility] and its employed or contracted physicians, physician assistants, nurse practitioners or other licensed health care professionals (the Practitioner), to provide me with telemedicine health care services (the "Services") as deemed necessary by the treating Practitioner. I acknowledge and consent to receive the Services by the Practitioner via telemedicine. I understand that the telemedicine visit will involve communicating with the Practitioner through live audiovisual communication technology and the disclosure of certain medical information by electronic transmission. I acknowledge that I have been given the opportunity to request an in-person assessment or other available alternative prior to the telemedicine visit and am voluntarily participating in the telemedicine visit.  I understand that I have the right to withhold or withdraw my consent to the use of telemedicine in the course of my care at any time, without affecting my right to future care or treatment, and that the Practitioner or I may terminate the telemedicine visit at any time. I understand that I have the right to inspect all information obtained and/or recorded in the course of the telemedicine visit and may receive copies of available information for a reasonable fee.  I understand that some of the potential risks of receiving the Services via telemedicine include:  Marland Kitchen Delay or interruption in medical evaluation due to technological equipment failure or disruption; . Information transmitted may not be sufficient (e.g. poor resolution of images) to allow for appropriate medical decision making by the  Practitioner; and/or  . In rare instances, security protocols could fail, causing a breach of personal health information.  Furthermore, I acknowledge that it is my responsibility to provide information about my medical history, conditions and care that is complete and accurate to the best of my ability. I acknowledge that Practitioner's advice, recommendations, and/or decision may be based on factors not within their control, such as incomplete or inaccurate data provided by me or distortions of diagnostic images or specimens that may result from electronic transmissions. I understand that the practice of medicine is not an exact science and that Practitioner makes no warranties or guarantees regarding treatment outcomes. I acknowledge that a copy of this consent can be made available to me via my patient portal ([facility]), or I can request a printed copy by calling the office of [facility].    I understand that my insurance will be billed for this visit.   I have read or had this consent read to me. . I understand the contents of this consent, which adequately explains the benefits and risks of the Services being provided via telemedicine.  . I have been provided ample opportunity to ask questions regarding this consent and the Services and have had my questions answered to my satisfaction. . I give my informed consent for the services to be provided through the use of telemedicine in my medical care
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
## 2019-05-20 ENCOUNTER — Emergency Department (HOSPITAL_COMMUNITY): Payer: Medicare Other

## 2019-05-20 ENCOUNTER — Encounter (HOSPITAL_COMMUNITY): Payer: Self-pay | Admitting: Emergency Medicine

## 2019-05-20 ENCOUNTER — Encounter (HOSPITAL_COMMUNITY): Admission: EM | Disposition: A | Discharge status: Rehabilitation Facility | Payer: Self-pay | Source: Home / Self Care

## 2019-05-20 ENCOUNTER — Emergency Department (HOSPITAL_COMMUNITY): Payer: Medicare Other | Admitting: Anesthesiology

## 2019-05-20 ENCOUNTER — Inpatient Hospital Stay (HOSPITAL_COMMUNITY): Payer: Medicare Other

## 2019-05-20 ENCOUNTER — Inpatient Hospital Stay (HOSPITAL_COMMUNITY)
Admission: EM | Admit: 2019-05-20 | Discharge: 2019-06-02 | DRG: 957 | Disposition: A | Discharge status: Rehabilitation Facility | Payer: Medicare Other | Attending: Physician Assistant | Admitting: Physician Assistant

## 2019-05-20 DIAGNOSIS — K567 Ileus, unspecified: Secondary | ICD-10-CM

## 2019-05-20 DIAGNOSIS — Z0189 Encounter for other specified special examinations: Secondary | ICD-10-CM

## 2019-05-20 DIAGNOSIS — I639 Cerebral infarction, unspecified: Secondary | ICD-10-CM | POA: Diagnosis not present

## 2019-05-20 DIAGNOSIS — S73015D Posterior dislocation of left hip, subsequent encounter: Secondary | ICD-10-CM | POA: Diagnosis not present

## 2019-05-20 DIAGNOSIS — Z87891 Personal history of nicotine dependence: Secondary | ICD-10-CM | POA: Diagnosis not present

## 2019-05-20 DIAGNOSIS — S27321A Contusion of lung, unilateral, initial encounter: Secondary | ICD-10-CM | POA: Diagnosis present

## 2019-05-20 DIAGNOSIS — Z992 Dependence on renal dialysis: Secondary | ICD-10-CM | POA: Diagnosis not present

## 2019-05-20 DIAGNOSIS — S40812A Abrasion of left upper arm, initial encounter: Secondary | ICD-10-CM | POA: Diagnosis present

## 2019-05-20 DIAGNOSIS — D631 Anemia in chronic kidney disease: Secondary | ICD-10-CM | POA: Diagnosis present

## 2019-05-20 DIAGNOSIS — G4700 Insomnia, unspecified: Secondary | ICD-10-CM | POA: Diagnosis present

## 2019-05-20 DIAGNOSIS — T07XXXA Unspecified multiple injuries, initial encounter: Secondary | ICD-10-CM | POA: Diagnosis not present

## 2019-05-20 DIAGNOSIS — Z23 Encounter for immunization: Secondary | ICD-10-CM | POA: Diagnosis present

## 2019-05-20 DIAGNOSIS — D62 Acute posthemorrhagic anemia: Secondary | ICD-10-CM | POA: Diagnosis present

## 2019-05-20 DIAGNOSIS — Z881 Allergy status to other antibiotic agents status: Secondary | ICD-10-CM | POA: Diagnosis not present

## 2019-05-20 DIAGNOSIS — I63519 Cerebral infarction due to unspecified occlusion or stenosis of unspecified middle cerebral artery: Secondary | ICD-10-CM | POA: Diagnosis present

## 2019-05-20 DIAGNOSIS — S79912A Unspecified injury of left hip, initial encounter: Secondary | ICD-10-CM | POA: Diagnosis present

## 2019-05-20 DIAGNOSIS — N186 End stage renal disease: Secondary | ICD-10-CM

## 2019-05-20 DIAGNOSIS — M898X9 Other specified disorders of bone, unspecified site: Secondary | ICD-10-CM | POA: Diagnosis present

## 2019-05-20 DIAGNOSIS — I10 Essential (primary) hypertension: Secondary | ICD-10-CM | POA: Diagnosis not present

## 2019-05-20 DIAGNOSIS — S72002A Fracture of unspecified part of neck of left femur, initial encounter for closed fracture: Secondary | ICD-10-CM | POA: Diagnosis present

## 2019-05-20 DIAGNOSIS — S32462S Displaced associated transverse-posterior fracture of left acetabulum, sequela: Secondary | ICD-10-CM | POA: Diagnosis not present

## 2019-05-20 DIAGNOSIS — Z885 Allergy status to narcotic agent status: Secondary | ICD-10-CM

## 2019-05-20 DIAGNOSIS — S32402A Unspecified fracture of left acetabulum, initial encounter for closed fracture: Secondary | ICD-10-CM | POA: Diagnosis present

## 2019-05-20 DIAGNOSIS — I959 Hypotension, unspecified: Secondary | ICD-10-CM | POA: Diagnosis present

## 2019-05-20 DIAGNOSIS — Z419 Encounter for procedure for purposes other than remedying health state, unspecified: Secondary | ICD-10-CM

## 2019-05-20 DIAGNOSIS — Z8673 Personal history of transient ischemic attack (TIA), and cerebral infarction without residual deficits: Secondary | ICD-10-CM

## 2019-05-20 DIAGNOSIS — S2243XD Multiple fractures of ribs, bilateral, subsequent encounter for fracture with routine healing: Secondary | ICD-10-CM | POA: Diagnosis not present

## 2019-05-20 DIAGNOSIS — I120 Hypertensive chronic kidney disease with stage 5 chronic kidney disease or end stage renal disease: Secondary | ICD-10-CM | POA: Diagnosis present

## 2019-05-20 DIAGNOSIS — G8929 Other chronic pain: Secondary | ICD-10-CM | POA: Diagnosis present

## 2019-05-20 DIAGNOSIS — S80212A Abrasion, left knee, initial encounter: Secondary | ICD-10-CM | POA: Diagnosis present

## 2019-05-20 DIAGNOSIS — I63412 Cerebral infarction due to embolism of left middle cerebral artery: Secondary | ICD-10-CM | POA: Diagnosis not present

## 2019-05-20 DIAGNOSIS — E1142 Type 2 diabetes mellitus with diabetic polyneuropathy: Secondary | ICD-10-CM

## 2019-05-20 DIAGNOSIS — I6389 Other cerebral infarction: Secondary | ICD-10-CM | POA: Diagnosis not present

## 2019-05-20 DIAGNOSIS — S32442A Displaced fracture of posterior column [ilioischial] of left acetabulum, initial encounter for closed fracture: Secondary | ICD-10-CM | POA: Diagnosis present

## 2019-05-20 DIAGNOSIS — Z91040 Latex allergy status: Secondary | ICD-10-CM | POA: Diagnosis not present

## 2019-05-20 DIAGNOSIS — E559 Vitamin D deficiency, unspecified: Secondary | ICD-10-CM | POA: Diagnosis present

## 2019-05-20 DIAGNOSIS — T148XXA Other injury of unspecified body region, initial encounter: Secondary | ICD-10-CM | POA: Diagnosis not present

## 2019-05-20 DIAGNOSIS — S80211A Abrasion, right knee, initial encounter: Secondary | ICD-10-CM | POA: Diagnosis present

## 2019-05-20 DIAGNOSIS — E785 Hyperlipidemia, unspecified: Secondary | ICD-10-CM | POA: Diagnosis present

## 2019-05-20 DIAGNOSIS — E1122 Type 2 diabetes mellitus with diabetic chronic kidney disease: Secondary | ICD-10-CM | POA: Diagnosis present

## 2019-05-20 DIAGNOSIS — R2689 Other abnormalities of gait and mobility: Secondary | ICD-10-CM | POA: Diagnosis present

## 2019-05-20 DIAGNOSIS — Z20822 Contact with and (suspected) exposure to covid-19: Secondary | ICD-10-CM | POA: Diagnosis present

## 2019-05-20 DIAGNOSIS — Z6841 Body Mass Index (BMI) 40.0 and over, adult: Secondary | ICD-10-CM | POA: Diagnosis not present

## 2019-05-20 DIAGNOSIS — I82442 Acute embolism and thrombosis of left tibial vein: Secondary | ICD-10-CM | POA: Diagnosis not present

## 2019-05-20 DIAGNOSIS — S32422D Displaced fracture of posterior wall of left acetabulum, subsequent encounter for fracture with routine healing: Secondary | ICD-10-CM | POA: Diagnosis not present

## 2019-05-20 DIAGNOSIS — S32810A Multiple fractures of pelvis with stable disruption of pelvic ring, initial encounter for closed fracture: Secondary | ICD-10-CM

## 2019-05-20 DIAGNOSIS — I6340 Cerebral infarction due to embolism of unspecified cerebral artery: Secondary | ICD-10-CM | POA: Insufficient documentation

## 2019-05-20 DIAGNOSIS — Z8249 Family history of ischemic heart disease and other diseases of the circulatory system: Secondary | ICD-10-CM

## 2019-05-20 DIAGNOSIS — Y832 Surgical operation with anastomosis, bypass or graft as the cause of abnormal reaction of the patient, or of later complication, without mention of misadventure at the time of the procedure: Secondary | ICD-10-CM | POA: Diagnosis present

## 2019-05-20 DIAGNOSIS — Y92410 Unspecified street and highway as the place of occurrence of the external cause: Secondary | ICD-10-CM

## 2019-05-20 DIAGNOSIS — I609 Nontraumatic subarachnoid hemorrhage, unspecified: Secondary | ICD-10-CM

## 2019-05-20 DIAGNOSIS — T82818A Embolism of vascular prosthetic devices, implants and grafts, initial encounter: Secondary | ICD-10-CM | POA: Diagnosis present

## 2019-05-20 DIAGNOSIS — G939 Disorder of brain, unspecified: Secondary | ICD-10-CM | POA: Diagnosis not present

## 2019-05-20 DIAGNOSIS — M549 Dorsalgia, unspecified: Secondary | ICD-10-CM | POA: Diagnosis present

## 2019-05-20 DIAGNOSIS — E876 Hypokalemia: Secondary | ICD-10-CM | POA: Diagnosis present

## 2019-05-20 DIAGNOSIS — R140 Abdominal distension (gaseous): Secondary | ICD-10-CM

## 2019-05-20 DIAGNOSIS — E1151 Type 2 diabetes mellitus with diabetic peripheral angiopathy without gangrene: Secondary | ICD-10-CM | POA: Diagnosis present

## 2019-05-20 DIAGNOSIS — K219 Gastro-esophageal reflux disease without esophagitis: Secondary | ICD-10-CM | POA: Diagnosis not present

## 2019-05-20 DIAGNOSIS — Z833 Family history of diabetes mellitus: Secondary | ICD-10-CM

## 2019-05-20 DIAGNOSIS — E871 Hypo-osmolality and hyponatremia: Secondary | ICD-10-CM | POA: Diagnosis not present

## 2019-05-20 DIAGNOSIS — S301XXA Contusion of abdominal wall, initial encounter: Secondary | ICD-10-CM | POA: Diagnosis present

## 2019-05-20 DIAGNOSIS — F329 Major depressive disorder, single episode, unspecified: Secondary | ICD-10-CM | POA: Diagnosis present

## 2019-05-20 DIAGNOSIS — S2243XA Multiple fractures of ribs, bilateral, initial encounter for closed fracture: Secondary | ICD-10-CM | POA: Diagnosis present

## 2019-05-20 DIAGNOSIS — T1490XA Injury, unspecified, initial encounter: Secondary | ICD-10-CM

## 2019-05-20 DIAGNOSIS — I69322 Dysarthria following cerebral infarction: Secondary | ICD-10-CM | POA: Diagnosis not present

## 2019-05-20 DIAGNOSIS — Z716 Tobacco abuse counseling: Secondary | ICD-10-CM | POA: Diagnosis not present

## 2019-05-20 DIAGNOSIS — I340 Nonrheumatic mitral (valve) insufficiency: Secondary | ICD-10-CM | POA: Diagnosis not present

## 2019-05-20 DIAGNOSIS — S73005A Unspecified dislocation of left hip, initial encounter: Secondary | ICD-10-CM | POA: Diagnosis present

## 2019-05-20 HISTORY — PX: HIP CLOSED REDUCTION: SHX983

## 2019-05-20 HISTORY — DX: Type 2 diabetes mellitus without complications: E11.9

## 2019-05-20 LAB — MAGNESIUM: Magnesium: 1.5 mg/dL — ABNORMAL LOW (ref 1.7–2.4)

## 2019-05-20 LAB — PROTIME-INR
INR: 1 (ref 0.8–1.2)
Prothrombin Time: 13.3 seconds (ref 11.4–15.2)

## 2019-05-20 LAB — I-STAT CHEM 8, ED
BUN: 35 mg/dL — ABNORMAL HIGH (ref 6–20)
Calcium, Ion: 1.03 mmol/L — ABNORMAL LOW (ref 1.15–1.40)
Chloride: 97 mmol/L — ABNORMAL LOW (ref 98–111)
Creatinine, Ser: 12 mg/dL — ABNORMAL HIGH (ref 0.61–1.24)
Glucose, Bld: 195 mg/dL — ABNORMAL HIGH (ref 70–99)
HCT: 49 % (ref 39.0–52.0)
Hemoglobin: 16.7 g/dL (ref 13.0–17.0)
Potassium: 2.4 mmol/L — CL (ref 3.5–5.1)
Sodium: 138 mmol/L (ref 135–145)
TCO2: 25 mmol/L (ref 22–32)

## 2019-05-20 LAB — CBC
HCT: 47.5 % (ref 39.0–52.0)
Hemoglobin: 15.3 g/dL (ref 13.0–17.0)
MCH: 29.7 pg (ref 26.0–34.0)
MCHC: 32.2 g/dL (ref 30.0–36.0)
MCV: 92.1 fL (ref 80.0–100.0)
Platelets: 171 10*3/uL (ref 150–400)
RBC: 5.16 MIL/uL (ref 4.22–5.81)
RDW: 14.5 % (ref 11.5–15.5)
WBC: 13 10*3/uL — ABNORMAL HIGH (ref 4.0–10.5)
nRBC: 0 % (ref 0.0–0.2)

## 2019-05-20 LAB — COMPREHENSIVE METABOLIC PANEL
ALT: 48 U/L — ABNORMAL HIGH (ref 0–44)
AST: 59 U/L — ABNORMAL HIGH (ref 15–41)
Albumin: 3.1 g/dL — ABNORMAL LOW (ref 3.5–5.0)
Alkaline Phosphatase: 69 U/L (ref 38–126)
Anion gap: 15 (ref 5–15)
BUN: 32 mg/dL — ABNORMAL HIGH (ref 6–20)
CO2: 24 mmol/L (ref 22–32)
Calcium: 8.7 mg/dL — ABNORMAL LOW (ref 8.9–10.3)
Chloride: 97 mmol/L — ABNORMAL LOW (ref 98–111)
Creatinine, Ser: 12.16 mg/dL — ABNORMAL HIGH (ref 0.61–1.24)
GFR calc Af Amer: 5 mL/min — ABNORMAL LOW (ref >60)
GFR calc non Af Amer: 5 mL/min — ABNORMAL LOW (ref >60)
Glucose, Bld: 211 mg/dL — ABNORMAL HIGH (ref 70–99)
Potassium: 2.5 mmol/L — CL (ref 3.5–5.1)
Sodium: 136 mmol/L (ref 135–145)
Total Bilirubin: 0.9 mg/dL (ref 0.3–1.2)
Total Protein: 6.4 g/dL — ABNORMAL LOW (ref 6.5–8.1)

## 2019-05-20 LAB — SAMPLE TO BLOOD BANK

## 2019-05-20 LAB — LACTIC ACID, PLASMA: Lactic Acid, Venous: 2.7 mmol/L (ref 0.5–1.9)

## 2019-05-20 LAB — RESPIRATORY PANEL BY RT PCR (FLU A&B, COVID)
Influenza A by PCR: NEGATIVE
Influenza B by PCR: NEGATIVE
SARS Coronavirus 2 by RT PCR: NEGATIVE

## 2019-05-20 LAB — ETHANOL: Alcohol, Ethyl (B): <10 mg/dL (ref <10)

## 2019-05-20 IMAGING — DX DG PELVIS 3+V JUDET
1 series · 3 of 3 positions shown · non-contrast
Comparison: [DATE]

CLINICAL DATA: Left acetabular fracture

EXAM:
JUDET PELVIS - 3+ VIEW

[Series 1: pelvis · 0.14mm/px · 3 of 3 slices shown]
[im 1/3]
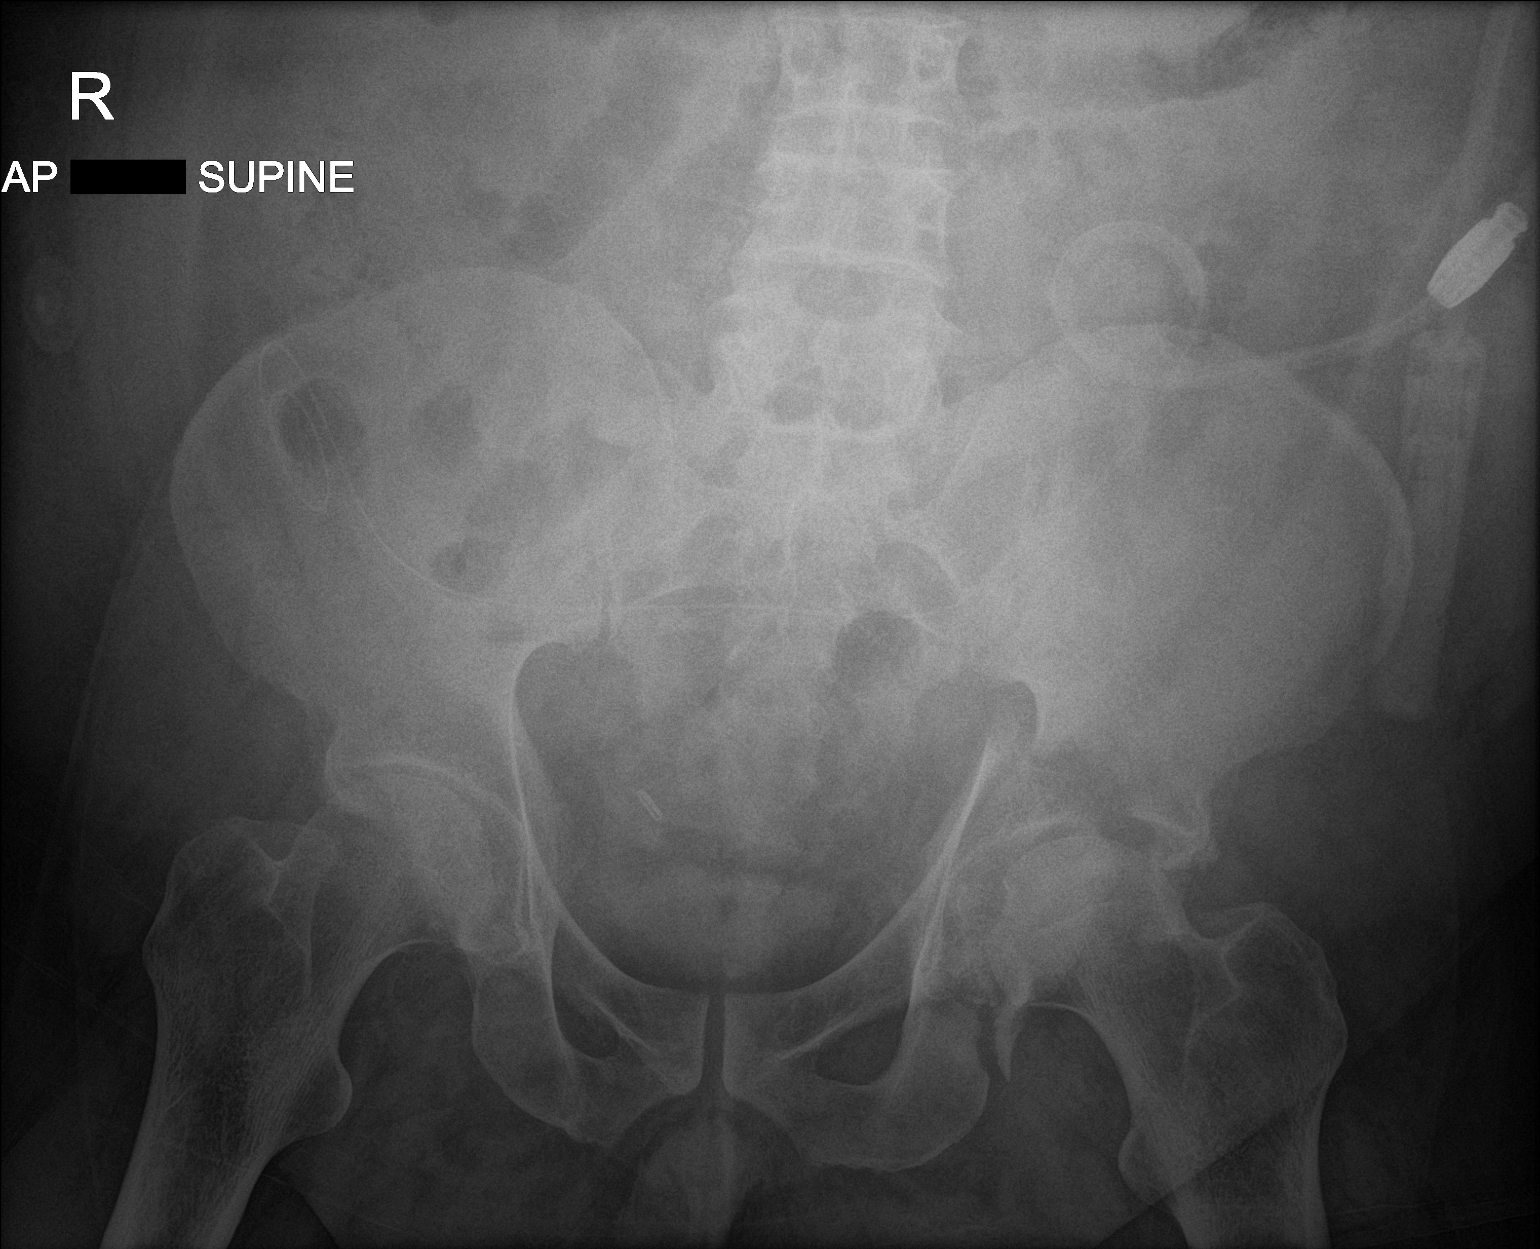
[im 2/3]
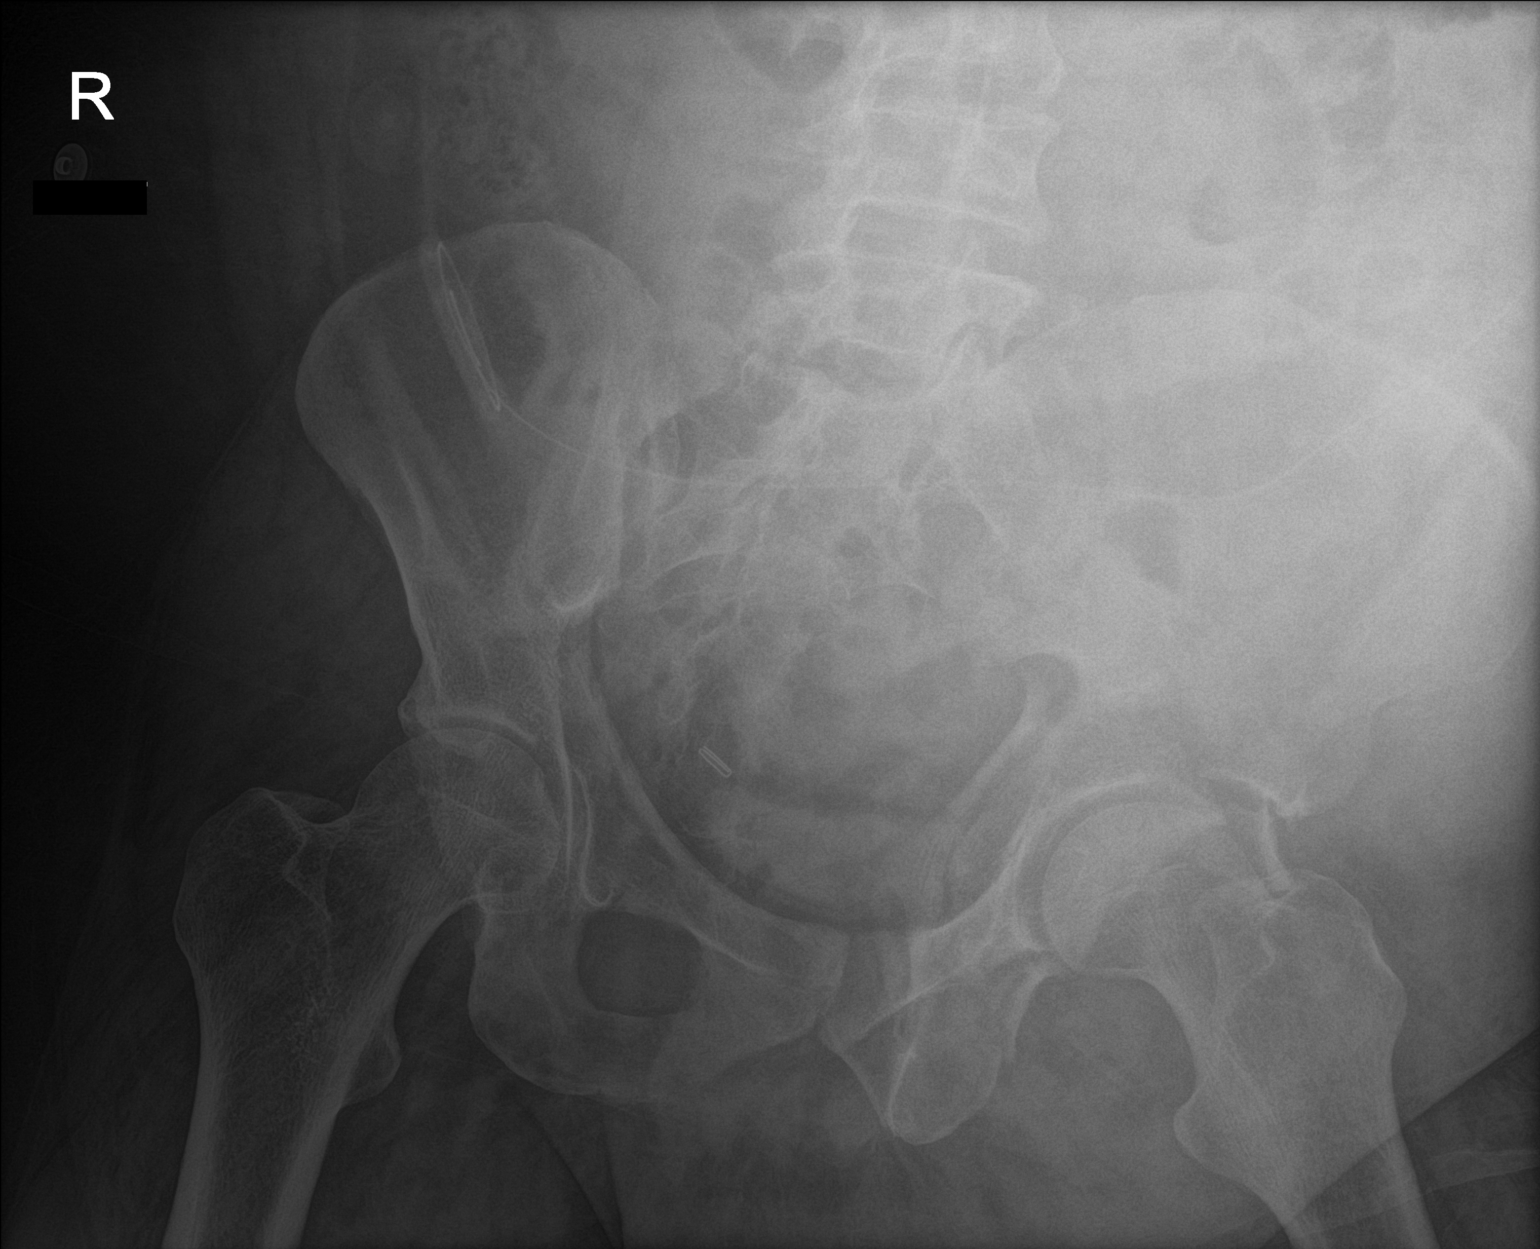
[im 3/3]
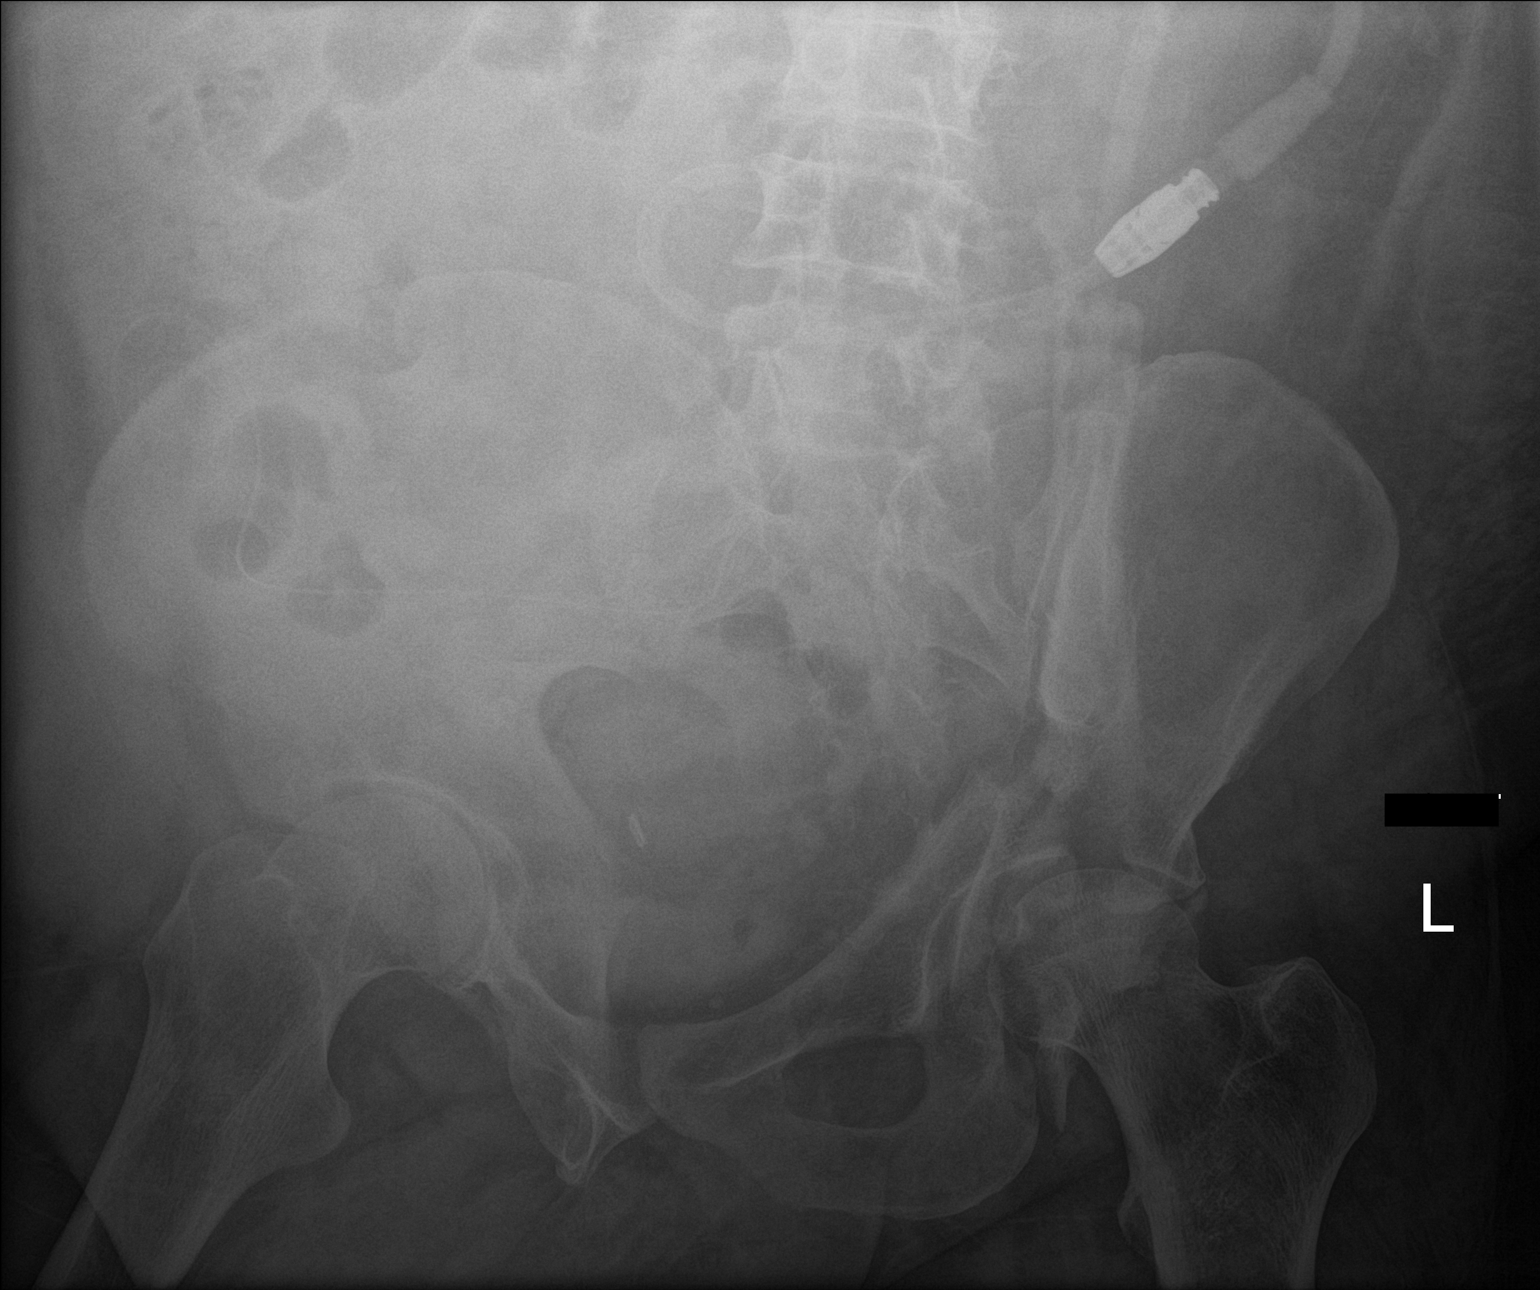

[3 of 3 positions shown; findings below may reference images not displayed]

FINDINGS: Frontal and bilateral Judet views of the pelvis are obtained. The
comminuted left acetabular fracture seen previously is again
identified, with slight interval reduction of the distraction of the
fracture fragments. The left femoral head is better seated within
the left acetabulum on this exam.

Stable right hip osteoarthritis.  No additional fractures.
IMPRESSION: 1. Interval reduction of the comminuted left acetabular fracture,
with improved alignment of the left hip as a result.
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
## 2019-05-20 IMAGING — CR DG PELVIS 3+V JUDET
3 series · 3 of 3 positions shown · non-contrast
Comparison: [DATE] [DATE], [DATE] ([DATE] p.m.)

CLINICAL DATA: Postoperative evaluation.

EXAM:
JUDET PELVIS - 3+ VIEW

[AP (1 of 3)]
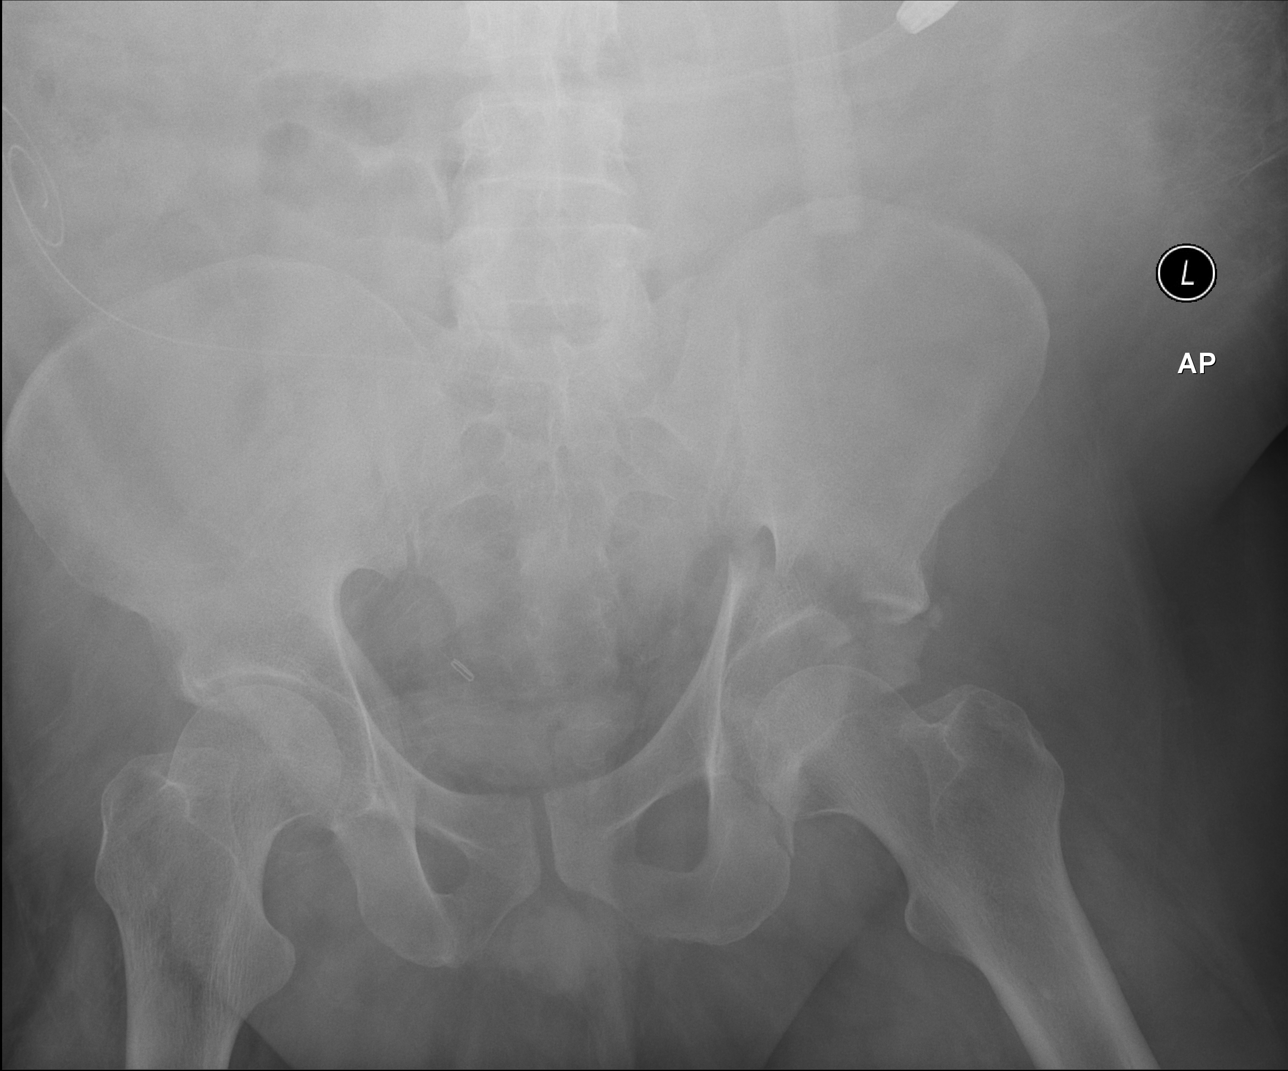

[AP (2 of 3)]
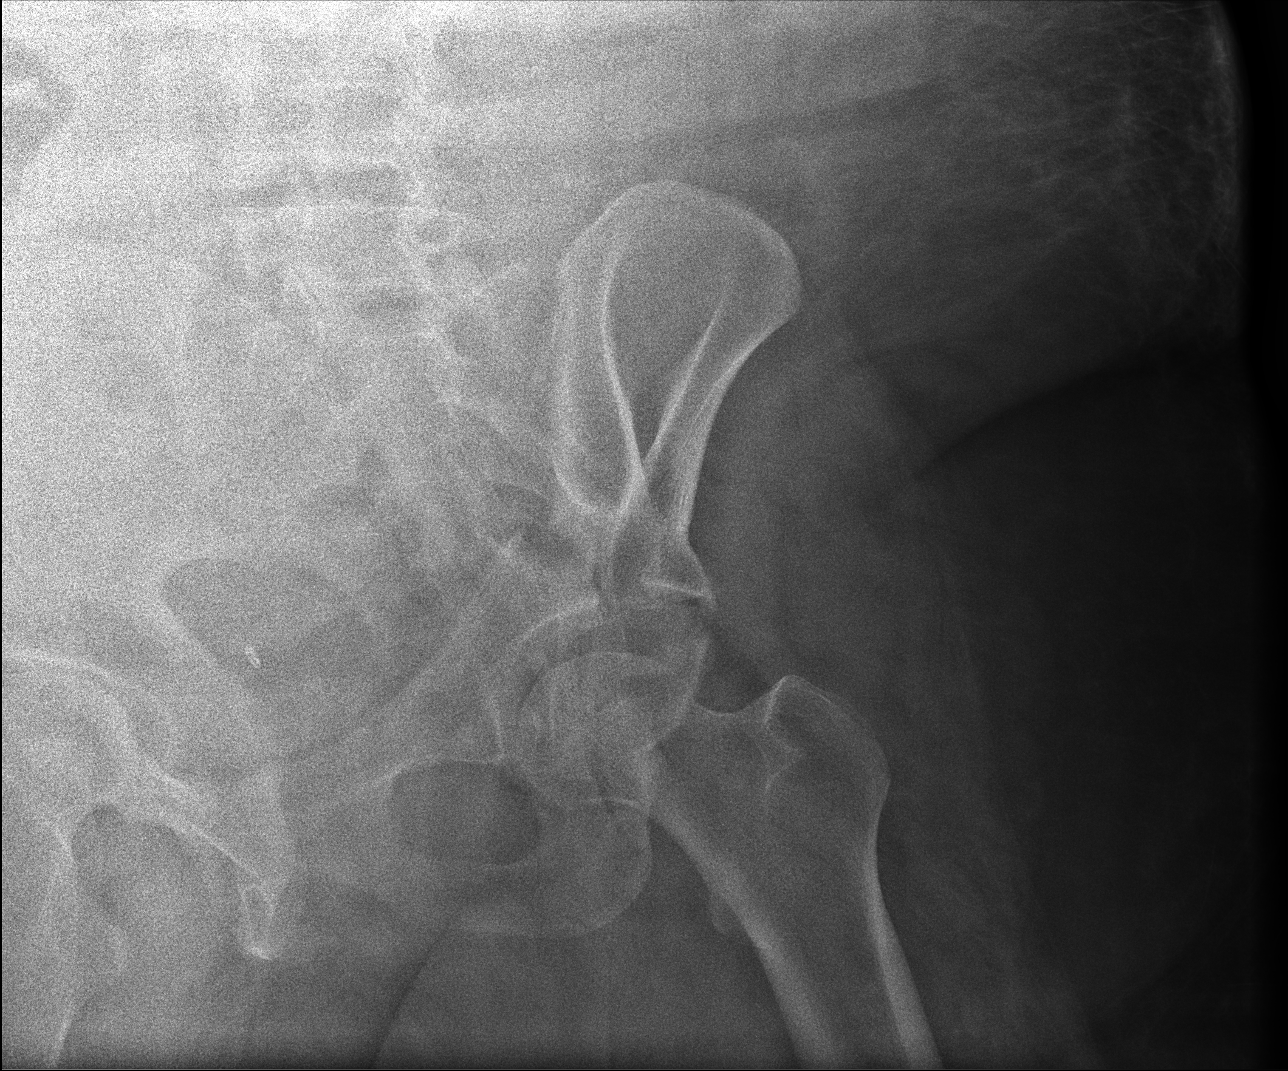

[AP (3 of 3)]
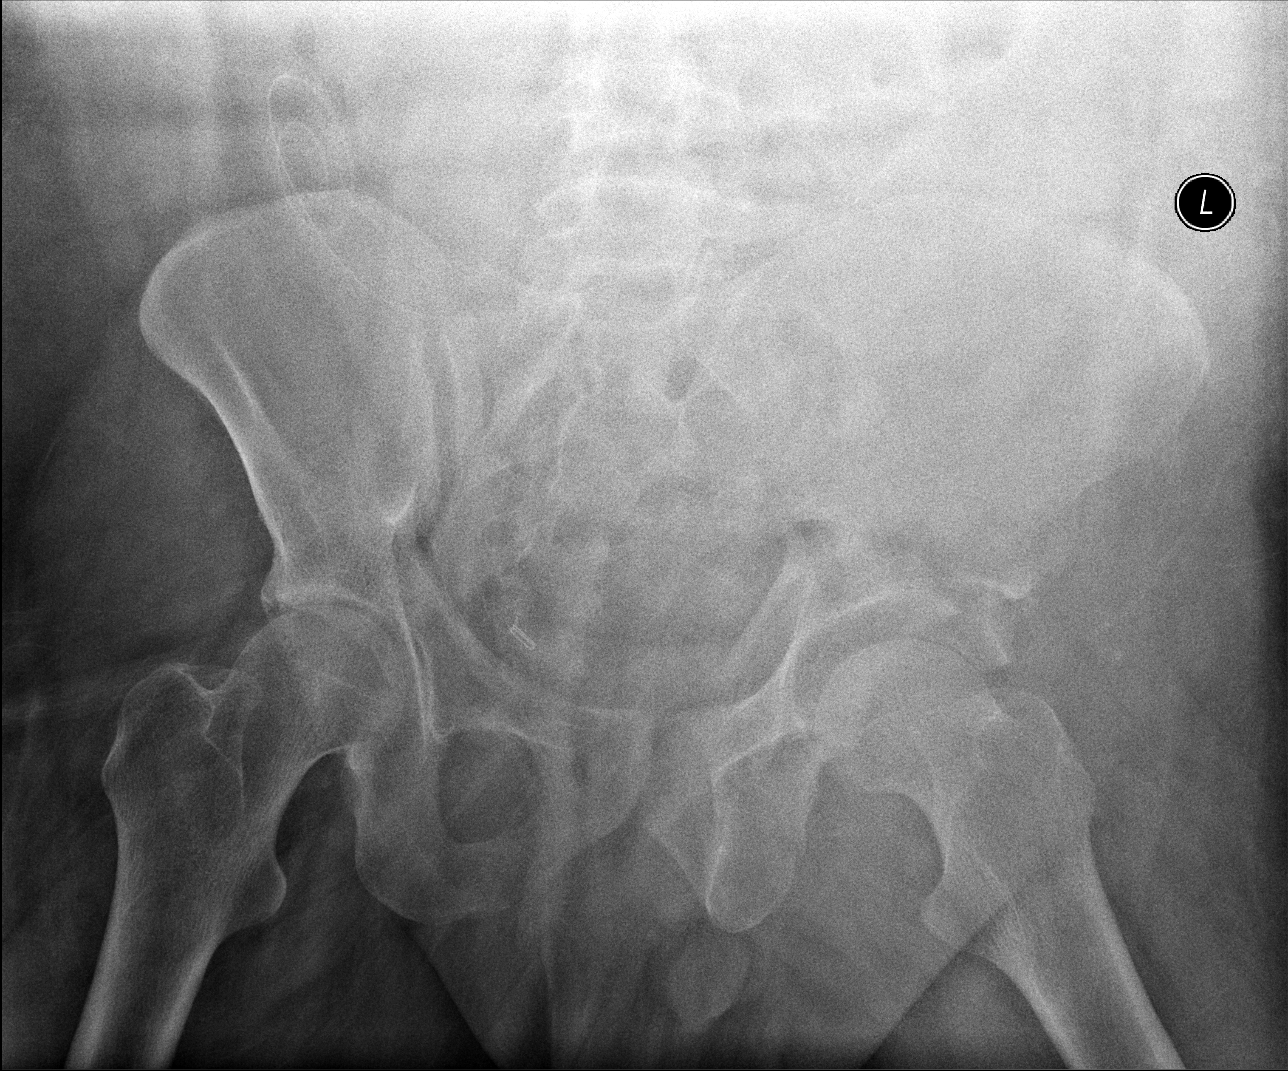

[3 of 3 positions shown; findings below may reference images not displayed]

FINDINGS: A comminuted fracture deformity is seen involving the superolateral
and inferomedial aspects of the left acetabulum. The associated
dislocation seen on the earlier study has been successfully reduced.
Marked severity widening of the left hip joint is seen
(approximately 1.6 cm). Soft tissue structures are unremarkable.
IMPRESSION: 1. Successful reduction of previously seen left hip dislocation.
2. Comminuted fracture deformity of the left acetabulum.

## 2019-05-20 SURGERY — CLOSED REDUCTION, HIP
Anesthesia: General | Site: Hip | Laterality: Left

## 2019-05-20 MED ORDER — FENTANYL CITRATE (PF) 250 MCG/5ML IJ SOLN
INTRAMUSCULAR | Status: DC | PRN
  Administered 2019-05-20: 125 ug via INTRAVENOUS

## 2019-05-20 MED ORDER — ACETAMINOPHEN 10 MG/ML IV SOLN
1000.0000 mg | Freq: Once | INTRAVENOUS | Status: DC | PRN
  Administered 2019-05-20: 1000 mg via INTRAVENOUS

## 2019-05-20 MED ORDER — HYDROMORPHONE HCL 1 MG/ML IJ SOLN
0.2500 mg | INTRAMUSCULAR | Status: DC | PRN
  Administered 2019-05-20 (×4): 0.25 mg via INTRAVENOUS

## 2019-05-20 MED ORDER — MEPERIDINE HCL 25 MG/ML IJ SOLN: 6.2500 mg | INTRAMUSCULAR | Status: DC | PRN

## 2019-05-20 MED ORDER — PROPOFOL 10 MG/ML IV BOLUS
INTRAVENOUS | Status: DC | PRN
Start: 2019-05-20 — End: 2019-05-20
  Administered 2019-05-20: 170 mg via INTRAVENOUS

## 2019-05-20 MED ORDER — ETOMIDATE 2 MG/ML IV SOLN
0.1000 mg/kg | Freq: Once | INTRAVENOUS | Status: AC
  Administered 2019-05-20: 12.44 mg via INTRAVENOUS
  Filled 2019-05-20: qty 10

## 2019-05-20 MED ORDER — LIDOCAINE HCL (CARDIAC) PF 100 MG/5ML IV SOSY
PREFILLED_SYRINGE | INTRAVENOUS | Status: DC | PRN
  Administered 2019-05-20: 50 mg via INTRATRACHEAL

## 2019-05-20 MED ORDER — HYDROMORPHONE HCL 1 MG/ML IJ SOLN
INTRAMUSCULAR | Status: AC
  Filled 2019-05-20: qty 1

## 2019-05-20 MED ORDER — ONDANSETRON HCL 4 MG/2ML IJ SOLN
INTRAMUSCULAR | Status: DC | PRN
  Administered 2019-05-20: 4 mg via INTRAVENOUS

## 2019-05-20 MED ORDER — FENTANYL CITRATE (PF) 100 MCG/2ML IJ SOLN
INTRAMUSCULAR | Status: AC
  Filled 2019-05-20: qty 2

## 2019-05-20 MED ORDER — LACTATED RINGERS IV SOLN: INTRAVENOUS | Status: DC | PRN

## 2019-05-20 MED ORDER — SUCCINYLCHOLINE CHLORIDE 20 MG/ML IJ SOLN
INTRAMUSCULAR | Status: DC | PRN
Start: 2019-05-20 — End: 2019-05-20
  Administered 2019-05-20: 160 mg via INTRAVENOUS

## 2019-05-20 MED ORDER — IOHEXOL 300 MG/ML  SOLN
100.0000 mL | Freq: Once | INTRAMUSCULAR | Status: AC | PRN
  Administered 2019-05-20: 15:00:00 100 mL via INTRAVENOUS

## 2019-05-20 MED ORDER — ETOMIDATE 2 MG/ML IV SOLN
INTRAVENOUS | Status: AC | PRN
  Administered 2019-05-20: 8 mg via INTRAVENOUS

## 2019-05-20 MED ORDER — MIDAZOLAM HCL 2 MG/2ML IJ SOLN
INTRAMUSCULAR | Status: AC
  Filled 2019-05-20: qty 2

## 2019-05-20 MED ORDER — PROMETHAZINE HCL 25 MG/ML IJ SOLN: 6.2500 mg | INTRAMUSCULAR | Status: DC | PRN

## 2019-05-20 MED ORDER — PHENYLEPHRINE HCL (PRESSORS) 10 MG/ML IV SOLN
INTRAVENOUS | Status: DC | PRN
Start: 2019-05-20 — End: 2019-05-20
  Administered 2019-05-20: 120 ug via INTRAVENOUS
  Administered 2019-05-20: 80 ug via INTRAVENOUS
  Administered 2019-05-20: 120 ug via INTRAVENOUS

## 2019-05-20 MED ORDER — PROPOFOL 10 MG/ML IV BOLUS
INTRAVENOUS | Status: AC
  Filled 2019-05-20: qty 20

## 2019-05-20 MED ORDER — FENTANYL CITRATE (PF) 250 MCG/5ML IJ SOLN
INTRAMUSCULAR | Status: AC
  Filled 2019-05-20: qty 5

## 2019-05-20 MED ORDER — POTASSIUM CHLORIDE 10 MEQ/100ML IV SOLN
10.0000 meq | Freq: Once | INTRAVENOUS | Status: AC
  Administered 2019-05-20: 10 meq via INTRAVENOUS
  Filled 2019-05-20: qty 100

## 2019-05-20 MED ORDER — POTASSIUM CHLORIDE 10 MEQ/100ML IV SOLN
10.0000 meq | INTRAVENOUS | Status: AC
  Administered 2019-05-20 (×2): 10 meq via INTRAVENOUS
  Filled 2019-05-20 (×2): qty 100

## 2019-05-20 MED ORDER — ACETAMINOPHEN 325 MG PO TABS: 325.0000 mg | ORAL_TABLET | Freq: Once | ORAL | Status: DC | PRN

## 2019-05-20 MED ORDER — TETANUS-DIPHTH-ACELL PERTUSSIS 5-2.5-18.5 LF-MCG/0.5 IM SUSP
0.5000 mL | Freq: Once | INTRAMUSCULAR | Status: AC
  Administered 2019-05-20: 0.5 mL via INTRAMUSCULAR
  Filled 2019-05-20: qty 0.5

## 2019-05-20 MED ORDER — ACETAMINOPHEN 160 MG/5ML PO SOLN: 325.0000 mg | Freq: Once | ORAL | Status: DC | PRN

## 2019-05-20 MED ORDER — LACTATED RINGERS IV SOLN: INTRAVENOUS | Status: DC

## 2019-05-20 MED ORDER — FENTANYL CITRATE (PF) 100 MCG/2ML IJ SOLN
INTRAMUSCULAR | Status: AC | PRN
  Administered 2019-05-20: 50 ug via INTRAVENOUS
  Administered 2019-05-20: 25 ug via INTRAVENOUS

## 2019-05-20 MED ORDER — MIDAZOLAM HCL 2 MG/2ML IJ SOLN
INTRAMUSCULAR | Status: DC | PRN
  Administered 2019-05-20: 2 mg via INTRAVENOUS

## 2019-05-20 MED ORDER — FENTANYL CITRATE (PF) 100 MCG/2ML IJ SOLN
INTRAMUSCULAR | Status: AC | PRN
  Administered 2019-05-20: 50 ug via INTRAVENOUS

## 2019-05-20 MED ORDER — SODIUM CHLORIDE 0.9 % IV SOLN
INTRAVENOUS | Status: AC | PRN
  Administered 2019-05-20: 1000 mL via INTRAVENOUS

## 2019-05-20 MED ORDER — ACETAMINOPHEN 10 MG/ML IV SOLN
INTRAVENOUS | Status: AC
  Filled 2019-05-20: qty 100

## 2019-05-20 SURGICAL SUPPLY — 9 items
BNDG GAUZE ELAST 4 BULKY (GAUZE/BANDAGES/DRESSINGS) ×3 IMPLANT
GLOVE BIO SURGEON STRL SZ7.5 (GLOVE) ×3 IMPLANT
GLOVE BIOGEL PI IND STRL 7.5 (GLOVE) ×2 IMPLANT
GLOVE BIOGEL PI INDICATOR 7.5 (GLOVE) ×1
GOWN STRL REUS W/ TWL XL LVL3 (GOWN DISPOSABLE) ×4 IMPLANT
GOWN STRL REUS W/TWL XL LVL3 (GOWN DISPOSABLE) ×2
KIT TURNOVER KIT B (KITS) ×3 IMPLANT
PAD ARMBOARD 7.5X6 YLW CONV (MISCELLANEOUS) ×6 IMPLANT
TOWEL GREEN STERILE FF (TOWEL DISPOSABLE) ×3 IMPLANT

## 2019-05-20 NOTE — Progress Notes (Signed)
Orthopedic Tech Progress Note Patient Details:  Max Pittman December 05, 1975 [id number]  Patient ID: [name], male   DOB: 01/30/1975, 44 y.o.   MRN: [id number]  Trauma Level 1 [name] 05/20/2019, 3:12 PM
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
## 2019-05-20 NOTE — Anesthesia Preprocedure Evaluation (Signed)
Anesthesia Evaluation  Patient identified by MRN, date of birth, ID band Patient awake    Reviewed: Allergy & Precautions, NPO status , Patient's Chart, lab work & pertinent test results, reviewed documented beta blocker date and time   Airway Mallampati: III  TM Distance: >3 FB Neck ROM: Full    Dental  (+) Teeth Intact, Dental Advisory Given   Pulmonary former smoker,    breath sounds clear to auscultation       Cardiovascular hypertension, Pt. on home beta blockers  Rhythm:Regular Rate:Normal     Neuro/Psych negative neurological ROS  negative psych ROS   GI/Hepatic   Endo/Other  diabetes, Type 2, Oral Hypoglycemic Agents  Renal/GU Renal disease     Musculoskeletal   Abdominal (+) + obese,   Peds  Hematology   Anesthesia Other Findings   Reproductive/Obstetrics                             Anesthesia Physical Anesthesia Plan  ASA: III and emergent  Anesthesia Plan: General   Post-op Pain Management:    Induction: Intravenous, Rapid sequence and Cricoid pressure planned  PONV Risk Score and Plan: 3 and Ondansetron, Dexamethasone and Midazolam  Airway Management Planned: Oral ETT  Additional Equipment: None  Intra-op Plan:   Post-operative Plan: Extubation in OR  Informed Consent: I have reviewed the patients History and Physical, chart, labs and discussed the procedure including the risks, benefits and alternatives for the proposed anesthesia with the patient or authorized representative who has indicated his/her understanding and acceptance.     Dental advisory given  Plan Discussed with: CRNA  Anesthesia Plan Comments:         Anesthesia Quick Evaluation

## 2019-05-20 NOTE — Anesthesia Postprocedure Evaluation (Signed)
Anesthesia Post Note  Patient: Max Pittman.  Procedure(s) Performed: CLOSED REDUCTION HIP WITH TRACTION PIN APPLICATION (Left Hip)     Patient location during evaluation: PACU Anesthesia Type: General Level of consciousness: awake and alert Pain management: pain level controlled Vital Signs Assessment: post-procedure vital signs reviewed and stable Respiratory status: spontaneous breathing, nonlabored ventilation, respiratory function stable and patient connected to nasal cannula oxygen Cardiovascular status: blood pressure returned to baseline and stable Postop Assessment: no apparent nausea or vomiting Anesthetic complications: no    Last Vitals:  Vitals:   05/20/19 2300 05/20/19 2315  BP: (!) 149/81   Pulse: (!) 108 (!) 110  Resp: (!) 21 20  Temp:    SpO2: 100% 95%                  Effie Berkshire

## 2019-05-20 NOTE — Brief Op Note (Signed)
[id number] 

## 2019-05-20 NOTE — Progress Notes (Signed)
X Ray called to come to PACU to do X Rays.  Tech says that the films have been completed by another tech who is on break.  It looks like to me they are active and not completed.  She will check with the other tech to make sure and let me know.
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
## 2019-05-20 NOTE — ED Notes (Signed)
Pt's belongings placed with security, paperwork tubed to PACU.
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
## 2019-05-20 NOTE — Consult Note (Signed)
Responded to page, pt unavailable, no family present, prayed for pt, staff will page again if further chaplain services needed.  Rev. Eloise Levels Chaplain
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
## 2019-05-20 NOTE — Procedures (Addendum)
Clinician: Jari Pigg, PA-C Emergency Room Physician: Freddie Apley, MD  Procedure: closed reduction L acetabulum fracture dislocation, Left proximal tibia skeletal traction pin placement   Medications:etomidate   Details:  Injury and treatment were reviewed with the patient.  skeletal traction placement necessary to further limit the injury to his joint surfaces on both his femoral head as well as acetabulum. It would also provide him some stability while we await definitive fixation. Patient is agreeable to proceed with traction pin placement.  Clinical exam was completed.  Hospital bed, traction set up and equipment was arranged prior to initiation of conscious sedation.   Emergency room physician present throughout the procedure for conscious sedation and to assist with reduction maneuver.  Etomidate was utilized for procedural sedation.  2 attempts were made at closed reduction which were unfortunately unsuccessful.  I never felt that the femoral head disengaged from fracture fragments or ever got enough length to clear over the rim of the acetabulum.  I did appreciate a fair amount of myoclonus/spasm during attempted reduction, patient had significant fasciculations of his quads and hamstrings at were visible.  Did not attempt any additional tries so as to not further risk damage to his proximal femur and articular surface on the femoral head.  I think he would be much better served in the operating room under general anesthesia for reduction and traction pin placement.   I did 2 different techniques to attempt reduction (Captain Morgan(Water Pump) and Allis maneuver)   Jari Pigg, PA-C [phone] (C) 05/20/2019, 6:06 PM  [facility] [place] [phone] Jenetta [phone] (F)    The service was rendered under my overall direction and control, and I was immediately available via phone/ pager or present on site. We will follow  up xrays to see how to proceed but patient's low blood pressure precluded use of better muscle relaxants that would have improved our chance of success and hip appears to remain dislocated. Anticipate proceeding to the operating room for full general anesthesia in order to obtain reduction.  Altamese Dublin, MD [facility] [phone] (p)
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
## 2019-05-20 NOTE — ED Notes (Signed)
Pt unable to give urine sample at this time, pt states he makes very little urine if any.

## 2019-05-20 NOTE — Transfer of Care (Signed)
Immediate Anesthesia Transfer of Care Note  Patient: Max Pittman.  Procedure(s) Performed: CLOSED REDUCTION HIP WITH TRACTION PIN APPLICATION (Left Hip)  Patient Location: PACU  Anesthesia Type:General  Level of Consciousness: sedated and drowsy  Airway & Oxygen Therapy: Patient connected to face mask oxygen  Post-op Assessment: Report given to RN and Post -op Vital signs reviewed and stable  Post vital signs: Reviewed and stable  Last Vitals:  Vitals Value Taken Time  BP    Temp    Pulse 108 05/20/19 2140  Resp 32 05/20/19 2140  SpO2 98 % 05/20/19 2140  Vitals shown include unvalidated device data.  Last Pain:  Vitals:   05/20/19 2134  TempSrc:   PainSc: (P) 0-No pain         Complications: No apparent anesthesia complications

## 2019-05-20 NOTE — Anesthesia Procedure Notes (Signed)
Procedure Name: Intubation Date/Time: 05/20/2019 8:42 PM Performed by: Valetta Fuller, CRNA Pre-anesthesia Checklist: Patient identified, Emergency Drugs available, Suction available and Patient being monitored Patient Re-evaluated:Patient Re-evaluated prior to induction Oxygen Delivery Method: Circle system utilized Preoxygenation: Pre-oxygenation with 100% oxygen Induction Type: IV induction, Rapid sequence and Cricoid Pressure applied Laryngoscope Size: Miller and 2 Grade View: Grade I Tube type: Oral Tube size: 7.5 mm Number of attempts: 1 Airway Equipment and Method: Stylet Placement Confirmation: ETT inserted through vocal cords under direct vision,  positive ETCO2 and breath sounds checked- equal and bilateral Secured at: 25 cm Tube secured with: Tape Dental Injury: Teeth and Oropharynx as per pre-operative assessment

## 2019-05-21 ENCOUNTER — Inpatient Hospital Stay (HOSPITAL_COMMUNITY): Payer: Medicare Other

## 2019-05-21 DIAGNOSIS — S73005A Unspecified dislocation of left hip, initial encounter: Secondary | ICD-10-CM

## 2019-05-21 DIAGNOSIS — E559 Vitamin D deficiency, unspecified: Secondary | ICD-10-CM | POA: Diagnosis present

## 2019-05-21 DIAGNOSIS — I639 Cerebral infarction, unspecified: Secondary | ICD-10-CM | POA: Diagnosis not present

## 2019-05-21 DIAGNOSIS — G939 Disorder of brain, unspecified: Secondary | ICD-10-CM

## 2019-05-21 HISTORY — DX: Unspecified dislocation of left hip, initial encounter: S73.005A

## 2019-05-21 LAB — RENAL FUNCTION PANEL
Albumin: 2.3 g/dL — ABNORMAL LOW (ref 3.5–5.0)
Anion gap: 15 (ref 5–15)
BUN: 34 mg/dL — ABNORMAL HIGH (ref 6–20)
CO2: 20 mmol/L — ABNORMAL LOW (ref 22–32)
Calcium: 8 mg/dL — ABNORMAL LOW (ref 8.9–10.3)
Chloride: 103 mmol/L (ref 98–111)
Creatinine, Ser: 12.42 mg/dL — ABNORMAL HIGH (ref 0.61–1.24)
GFR calc Af Amer: 5 mL/min — ABNORMAL LOW (ref >60)
GFR calc non Af Amer: 4 mL/min — ABNORMAL LOW (ref >60)
Glucose, Bld: 162 mg/dL — ABNORMAL HIGH (ref 70–99)
Phosphorus: 5.2 mg/dL — ABNORMAL HIGH (ref 2.5–4.6)
Potassium: 2.7 mmol/L — CL (ref 3.5–5.1)
Sodium: 138 mmol/L (ref 135–145)

## 2019-05-21 LAB — HEPATIC FUNCTION PANEL
ALT: 38 U/L (ref 0–44)
AST: 71 U/L — ABNORMAL HIGH (ref 15–41)
Albumin: 2.3 g/dL — ABNORMAL LOW (ref 3.5–5.0)
Alkaline Phosphatase: 48 U/L (ref 38–126)
Bilirubin, Direct: 0.2 mg/dL (ref 0.0–0.2)
Indirect Bilirubin: 0.6 mg/dL (ref 0.3–0.9)
Total Bilirubin: 0.8 mg/dL (ref 0.3–1.2)
Total Protein: 5.2 g/dL — ABNORMAL LOW (ref 6.5–8.1)

## 2019-05-21 LAB — HEMOGLOBIN A1C
Hgb A1c MFr Bld: 6.2 % — ABNORMAL HIGH (ref 4.8–5.6)
Mean Plasma Glucose: 131.24 mg/dL

## 2019-05-21 LAB — MAGNESIUM: Magnesium: 1.3 mg/dL — ABNORMAL LOW (ref 1.7–2.4)

## 2019-05-21 LAB — GLUCOSE, CAPILLARY
Glucose-Capillary: 149 mg/dL — ABNORMAL HIGH (ref 70–99)
Glucose-Capillary: 158 mg/dL — ABNORMAL HIGH (ref 70–99)
Glucose-Capillary: 155 mg/dL — ABNORMAL HIGH (ref 70–99)
Glucose-Capillary: 131 mg/dL — ABNORMAL HIGH (ref 70–99)
Glucose-Capillary: 174 mg/dL — ABNORMAL HIGH (ref 70–99)

## 2019-05-21 LAB — CBC
HCT: 37.3 % — ABNORMAL LOW (ref 39.0–52.0)
Hemoglobin: 12 g/dL — ABNORMAL LOW (ref 13.0–17.0)
MCH: 30.3 pg (ref 26.0–34.0)
MCHC: 32.2 g/dL (ref 30.0–36.0)
MCV: 94.2 fL (ref 80.0–100.0)
Platelets: 130 10*3/uL — ABNORMAL LOW (ref 150–400)
RBC: 3.96 MIL/uL — ABNORMAL LOW (ref 4.22–5.81)
RDW: 14.8 % (ref 11.5–15.5)
WBC: 7.6 10*3/uL (ref 4.0–10.5)
nRBC: 0 % (ref 0.0–0.2)

## 2019-05-21 LAB — VITAMIN D 25 HYDROXY (VIT D DEFICIENCY, FRACTURES): Vit D, 25-Hydroxy: 13.43 ng/mL — ABNORMAL LOW (ref 30–100)
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
## 2019-05-21 MED ORDER — HEPARIN 1000 UNIT/ML FOR PERITONEAL DIALYSIS
INTRAPERITONEAL | Status: DC | PRN
  Filled 2019-05-21: qty 5000

## 2019-05-21 MED ORDER — ACETAMINOPHEN 325 MG PO TABS
650.0000 mg | ORAL_TABLET | Freq: Four times a day (QID) | ORAL | Status: DC
  Administered 2019-05-21 – 2019-05-27 (×21): 650 mg via ORAL
  Filled 2019-05-21 (×21): qty 2

## 2019-05-21 MED ORDER — GENTAMICIN SULFATE 0.1 % EX CREA
1.0000 "application " | TOPICAL_CREAM | Freq: Every day | CUTANEOUS | Status: DC
  Administered 2019-05-21 – 2019-06-02 (×11): 1 via TOPICAL
  Filled 2019-05-21: qty 15

## 2019-05-21 MED ORDER — INSULIN ASPART 100 UNIT/ML ~~LOC~~ SOLN
0.0000 [IU] | Freq: Three times a day (TID) | SUBCUTANEOUS | Status: DC
  Administered 2019-05-21 (×2): 3 [IU] via SUBCUTANEOUS
  Administered 2019-05-21 – 2019-05-24 (×5): 2 [IU] via SUBCUTANEOUS
  Administered 2019-05-26: 3 [IU] via SUBCUTANEOUS
  Administered 2019-05-26 – 2019-05-29 (×4): 2 [IU] via SUBCUTANEOUS
  Administered 2019-05-30: 3 [IU] via SUBCUTANEOUS
  Administered 2019-05-31 – 2019-06-01 (×2): 2 [IU] via SUBCUTANEOUS

## 2019-05-21 MED ORDER — CARVEDILOL 12.5 MG PO TABS
12.5000 mg | ORAL_TABLET | Freq: Two times a day (BID) | ORAL | Status: DC
  Administered 2019-05-21 – 2019-05-23 (×3): 12.5 mg via ORAL
  Filled 2019-05-21 (×3): qty 1

## 2019-05-21 MED ORDER — LACTATED RINGERS IV SOLN: INTRAVENOUS | Status: DC

## 2019-05-21 MED ORDER — OXYCODONE HCL 5 MG PO TABS
10.0000 mg | ORAL_TABLET | ORAL | Status: DC | PRN
  Administered 2019-05-21 – 2019-05-22 (×3): 15 mg via ORAL
  Administered 2019-05-22 (×2): 10 mg via ORAL
  Administered 2019-05-23 – 2019-05-24 (×6): 15 mg via ORAL
  Filled 2019-05-21 (×9): qty 3

## 2019-05-21 MED ORDER — CEFAZOLIN SODIUM-DEXTROSE 2-4 GM/100ML-% IV SOLN
2.0000 g | INTRAVENOUS | Status: AC
  Filled 2019-05-21: qty 100

## 2019-05-21 MED ORDER — MAGNESIUM SULFATE 4 GM/100ML IV SOLN
4.0000 g | Freq: Once | INTRAVENOUS | Status: AC
  Administered 2019-05-21: 4 g via INTRAVENOUS
  Filled 2019-05-21: qty 100

## 2019-05-21 MED ORDER — POTASSIUM CHLORIDE 10 MEQ/100ML IV SOLN
10.0000 meq | INTRAVENOUS | Status: AC
  Administered 2019-05-21 (×4): 10 meq via INTRAVENOUS
  Filled 2019-05-21 (×4): qty 100

## 2019-05-21 MED ORDER — METHOCARBAMOL 500 MG PO TABS
500.0000 mg | ORAL_TABLET | Freq: Three times a day (TID) | ORAL | Status: DC
  Administered 2019-05-21 – 2019-05-23 (×8): 500 mg via ORAL
  Filled 2019-05-21 (×8): qty 1

## 2019-05-21 MED ORDER — DELFLEX-LC/2.5% DEXTROSE 394 MOSM/L IP SOLN
INTRAPERITONEAL | Status: DC
  Administered 2019-05-21: 5000 mL via INTRAPERITONEAL

## 2019-05-21 MED ORDER — ONDANSETRON HCL 4 MG PO TABS: 4.0000 mg | ORAL_TABLET | Freq: Four times a day (QID) | ORAL | Status: DC | PRN

## 2019-05-21 MED ORDER — ONDANSETRON HCL 4 MG/2ML IJ SOLN
4.0000 mg | Freq: Four times a day (QID) | INTRAMUSCULAR | Status: DC | PRN
  Administered 2019-05-25 – 2019-05-27 (×2): 4 mg via INTRAVENOUS
  Filled 2019-05-21 (×2): qty 2

## 2019-05-21 MED ORDER — METHOCARBAMOL 1000 MG/10ML IJ SOLN
500.0000 mg | Freq: Three times a day (TID) | INTRAVENOUS | Status: DC
  Filled 2019-05-21 (×13): qty 5

## 2019-05-21 MED ORDER — DOCUSATE SODIUM 100 MG PO CAPS
100.0000 mg | ORAL_CAPSULE | Freq: Two times a day (BID) | ORAL | Status: DC
  Administered 2019-05-21 – 2019-05-27 (×10): 100 mg via ORAL
  Filled 2019-05-21 (×10): qty 1

## 2019-05-21 MED ORDER — METOCLOPRAMIDE HCL 5 MG/ML IJ SOLN: 5.0000 mg | Freq: Three times a day (TID) | INTRAMUSCULAR | Status: DC | PRN

## 2019-05-21 MED ORDER — HYDROMORPHONE HCL 1 MG/ML IJ SOLN
0.5000 mg | INTRAMUSCULAR | Status: DC | PRN
  Administered 2019-05-21 – 2019-05-23 (×5): 1 mg via INTRAVENOUS
  Filled 2019-05-21 (×6): qty 1

## 2019-05-21 MED ORDER — POTASSIUM CHLORIDE 10 MEQ/100ML IV SOLN: 10.0000 meq | INTRAVENOUS | Status: DC

## 2019-05-21 MED ORDER — BUPROPION HCL ER (SR) 150 MG PO TB12
150.0000 mg | ORAL_TABLET | Freq: Two times a day (BID) | ORAL | Status: DC
  Administered 2019-05-21 – 2019-06-02 (×20): 150 mg via ORAL
  Filled 2019-05-21 (×21): qty 1

## 2019-05-21 MED ORDER — METOCLOPRAMIDE HCL 5 MG PO TABS
5.0000 mg | ORAL_TABLET | Freq: Three times a day (TID) | ORAL | Status: DC | PRN
  Administered 2019-05-24: 5 mg via ORAL
  Filled 2019-05-21: qty 1

## 2019-05-21 MED ORDER — HEPARIN 1000 UNIT/ML FOR PERITONEAL DIALYSIS: 500.0000 [IU] | INTRAMUSCULAR | Status: DC | PRN

## 2019-05-21 MED ORDER — OXYCODONE HCL 5 MG PO TABS
5.0000 mg | ORAL_TABLET | ORAL | Status: DC | PRN
  Administered 2019-05-21 – 2019-05-26 (×4): 10 mg via ORAL
  Filled 2019-05-21 (×6): qty 2
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
## 2019-05-21 NOTE — Progress Notes (Signed)
Report given to Darrell 4E RN.  Made RN aware that pt had a low K and Low mag levels.  K has been replaced x1 but it doesn't appear that mag has been replaced.  Also it doesn't look like the patient has received any antibiotics.  RN made aware.  VSS.

## 2019-05-21 NOTE — Progress Notes (Signed)
CRITICAL VALUE ALERT  Critical Value:  Potassium 2.7  Date & Time Notied:  05/21/2019 05:10  Provider Notified: Redmond Pulling  Orders Received/Actions taken: Yes

## 2019-05-21 NOTE — Progress Notes (Signed)
Obtained consent form and placed in pt's chart.   Renleigh Ouellet S Kennett Symes, RN  
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
## 2019-05-21 NOTE — Progress Notes (Signed)
Handed pt's belongings including wallet, cell phone, and watch to pt's girlfriend, Norton Pastel to take them home.   Lavenia Atlas, RN

## 2019-05-21 NOTE — Progress Notes (Signed)
VASCULAR LAB PRELIMINARY  PRELIMINARY  PRELIMINARY  PRELIMINARY  Carotid duplex completed.    Preliminary report:  See CV proc for preliminary results.  Adelheid Hoggard, RVT 05/21/2019, 12:28 PM
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
## 2019-05-21 NOTE — Progress Notes (Signed)
Orthopedic Tech Progress Note Patient Details:  Max Pittman. May 24, 1975 [id number] Trapeze with over head frame     Post Interventions Patient Tolerated: Well   [name] 05/21/2019, 1:31 PM

## 2019-05-22 ENCOUNTER — Inpatient Hospital Stay (HOSPITAL_COMMUNITY): Payer: Medicare Other

## 2019-05-22 ENCOUNTER — Encounter (HOSPITAL_COMMUNITY): Payer: Self-pay

## 2019-05-22 ENCOUNTER — Inpatient Hospital Stay (HOSPITAL_COMMUNITY): Payer: Medicare Other | Admitting: Certified Registered Nurse Anesthetist

## 2019-05-22 ENCOUNTER — Encounter (HOSPITAL_COMMUNITY): Admission: EM | Disposition: A | Discharge status: Rehabilitation Facility | Payer: Self-pay | Source: Home / Self Care

## 2019-05-22 DIAGNOSIS — I609 Nontraumatic subarachnoid hemorrhage, unspecified: Secondary | ICD-10-CM

## 2019-05-22 DIAGNOSIS — I6389 Other cerebral infarction: Secondary | ICD-10-CM | POA: Diagnosis not present

## 2019-05-22 HISTORY — PX: ORIF ACETABULAR FRACTURE: SHX5029

## 2019-05-22 HISTORY — PX: APPLICATION OF WOUND VAC: SHX5189

## 2019-05-22 LAB — ECHOCARDIOGRAM COMPLETE
Height: 69 in
Weight: 4384 oz

## 2019-05-22 LAB — POCT I-STAT, CHEM 8
BUN: 51 mg/dL — ABNORMAL HIGH (ref 6–20)
BUN: 52 mg/dL — ABNORMAL HIGH (ref 6–20)
Calcium, Ion: 1.15 mmol/L (ref 1.15–1.40)
Calcium, Ion: 1.09 mmol/L — ABNORMAL LOW (ref 1.15–1.40)
Chloride: 97 mmol/L — ABNORMAL LOW (ref 98–111)
Chloride: 99 mmol/L (ref 98–111)
Creatinine, Ser: 15 mg/dL — ABNORMAL HIGH (ref 0.61–1.24)
Creatinine, Ser: 15.2 mg/dL — ABNORMAL HIGH (ref 0.61–1.24)
Glucose, Bld: 123 mg/dL — ABNORMAL HIGH (ref 70–99)
Glucose, Bld: 145 mg/dL — ABNORMAL HIGH (ref 70–99)
HCT: 50 % (ref 39.0–52.0)
HCT: 29 % — ABNORMAL LOW (ref 39.0–52.0)
Hemoglobin: 17 g/dL (ref 13.0–17.0)
Hemoglobin: 9.9 g/dL — ABNORMAL LOW (ref 13.0–17.0)
Potassium: 3.5 mmol/L (ref 3.5–5.1)
Potassium: 4.2 mmol/L (ref 3.5–5.1)
Sodium: 133 mmol/L — ABNORMAL LOW (ref 135–145)
Sodium: 133 mmol/L — ABNORMAL LOW (ref 135–145)
TCO2: 27 mmol/L (ref 22–32)
TCO2: 22 mmol/L (ref 22–32)

## 2019-05-22 LAB — GLUCOSE, CAPILLARY
Glucose-Capillary: 104 mg/dL — ABNORMAL HIGH (ref 70–99)
Glucose-Capillary: 88 mg/dL (ref 70–99)
Glucose-Capillary: 55 mg/dL — ABNORMAL LOW (ref 70–99)
Glucose-Capillary: 56 mg/dL — ABNORMAL LOW (ref 70–99)
Glucose-Capillary: 127 mg/dL — ABNORMAL HIGH (ref 70–99)
Glucose-Capillary: 134 mg/dL — ABNORMAL HIGH (ref 70–99)
Glucose-Capillary: 143 mg/dL — ABNORMAL HIGH (ref 70–99)

## 2019-05-22 LAB — BASIC METABOLIC PANEL
Anion gap: 11 (ref 5–15)
BUN: 39 mg/dL — ABNORMAL HIGH (ref 6–20)
CO2: 26 mmol/L (ref 22–32)
Calcium: 8.7 mg/dL — ABNORMAL LOW (ref 8.9–10.3)
Chloride: 98 mmol/L (ref 98–111)
Creatinine, Ser: 14.22 mg/dL — ABNORMAL HIGH (ref 0.61–1.24)
GFR calc Af Amer: 4 mL/min — ABNORMAL LOW (ref >60)
GFR calc non Af Amer: 4 mL/min — ABNORMAL LOW (ref >60)
Glucose, Bld: 125 mg/dL — ABNORMAL HIGH (ref 70–99)
Potassium: 4.1 mmol/L (ref 3.5–5.1)
Sodium: 135 mmol/L (ref 135–145)

## 2019-05-22 LAB — CBC
HCT: 34.4 % — ABNORMAL LOW (ref 39.0–52.0)
Hemoglobin: 11.2 g/dL — ABNORMAL LOW (ref 13.0–17.0)
MCH: 30.1 pg (ref 26.0–34.0)
MCHC: 32.6 g/dL (ref 30.0–36.0)
MCV: 92.5 fL (ref 80.0–100.0)
Platelets: 139 10*3/uL — ABNORMAL LOW (ref 150–400)
RBC: 3.72 MIL/uL — ABNORMAL LOW (ref 4.22–5.81)
RDW: 14.8 % (ref 11.5–15.5)
WBC: 9.7 10*3/uL (ref 4.0–10.5)
nRBC: 0 % (ref 0.0–0.2)

## 2019-05-22 LAB — LIPID PANEL
Cholesterol: 82 mg/dL (ref 0–200)
HDL: 25 mg/dL — ABNORMAL LOW (ref >40)
LDL Cholesterol: 34 mg/dL (ref 0–99)
Total CHOL/HDL Ratio: 3.3 RATIO
Triglycerides: 117 mg/dL (ref <150)
VLDL: 23 mg/dL (ref 0–40)

## 2019-05-22 LAB — SURGICAL PCR SCREEN
MRSA, PCR: NEGATIVE
Staphylococcus aureus: POSITIVE — AB

## 2019-05-22 LAB — ABO/RH: ABO/RH(D): A POS

## 2019-05-22 IMAGING — RF DG C-ARM 1-60 MIN
1 series · 8 of 8 positions shown · IV contrast (agent unspecified)
Comparison: None.

CLINICAL DATA: Open reduction and internal fixation of the left
acetabulum.

EXAM:
DG C-ARM 1-60 MIN
CONTRAST:  None
FLUOROSCOPY TIME:  Fluoroscopy Time:  27 seconds
Radiation Exposure Index (if provided by the fluoroscopic device):
N/A
Number of Acquired Spot Images: 8

[Series 1: run · 8 of 8 slices shown]
[im 1/8]
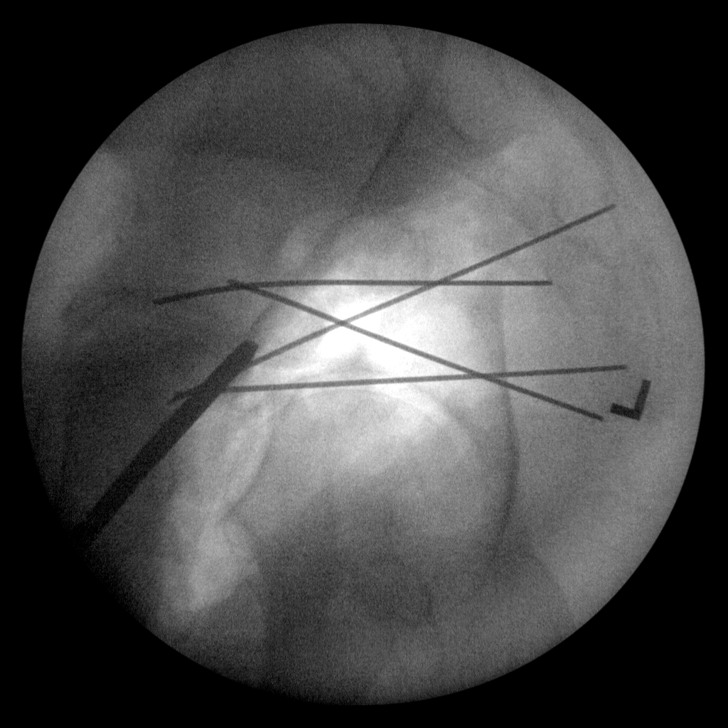
[im 2/8]
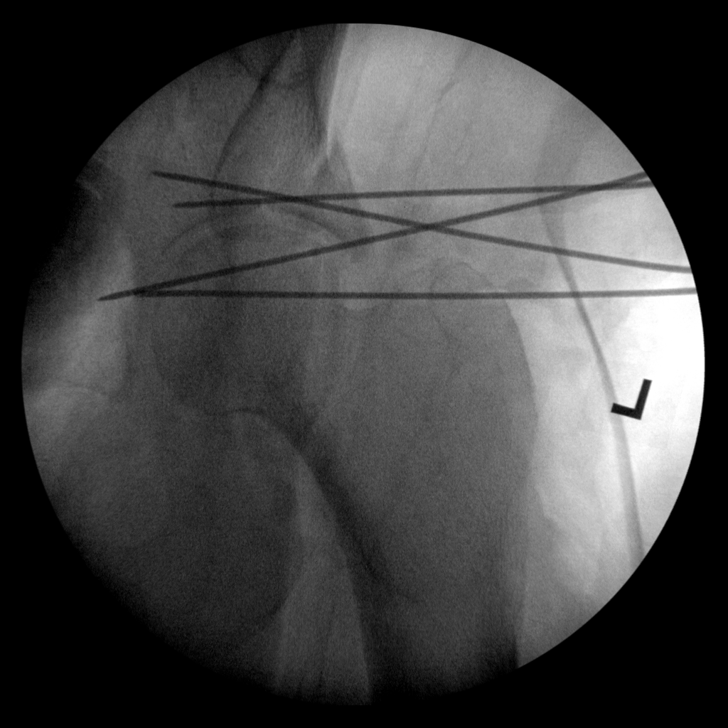
[im 3/8]
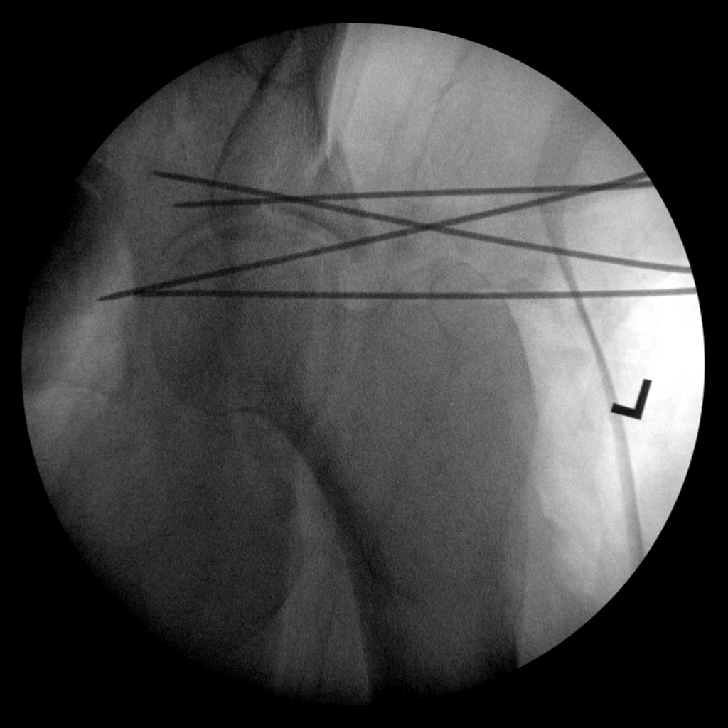
[im 4/8]
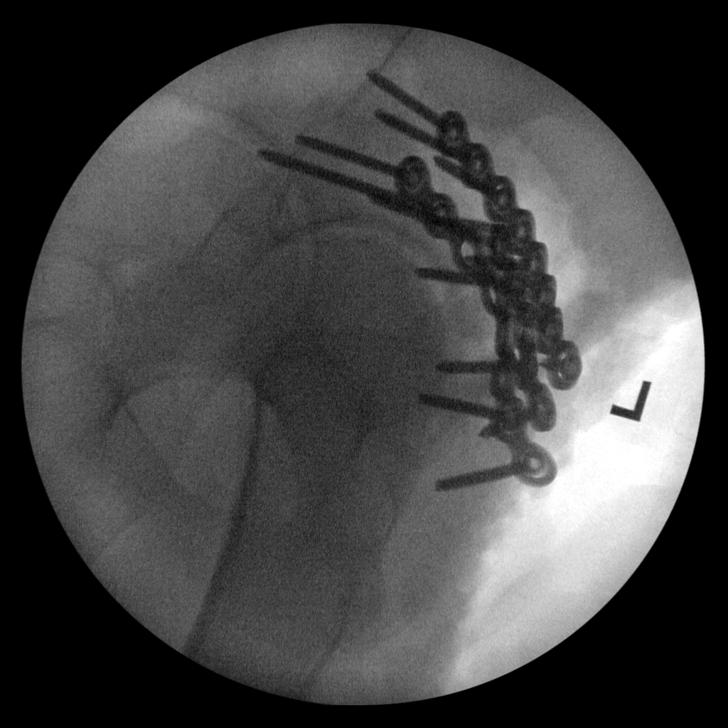
[im 5/8]
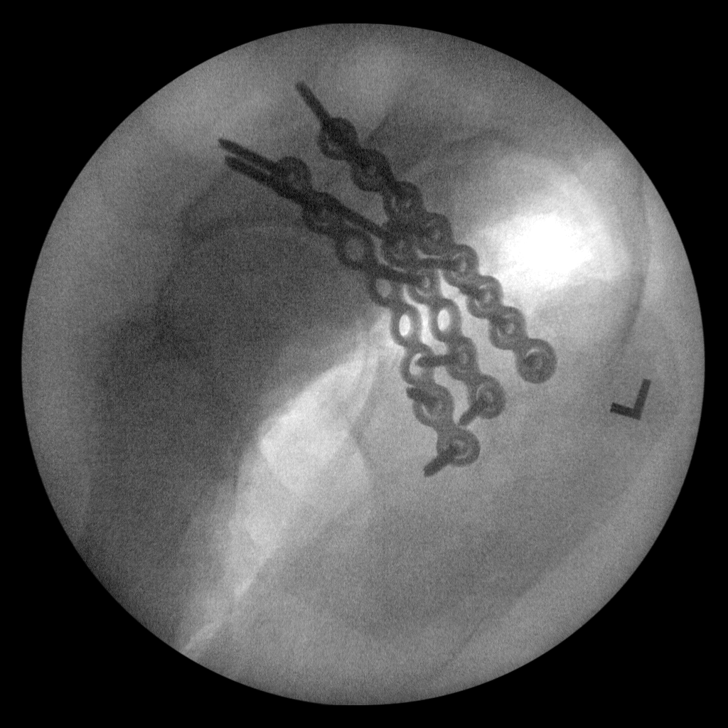
[im 6/8]
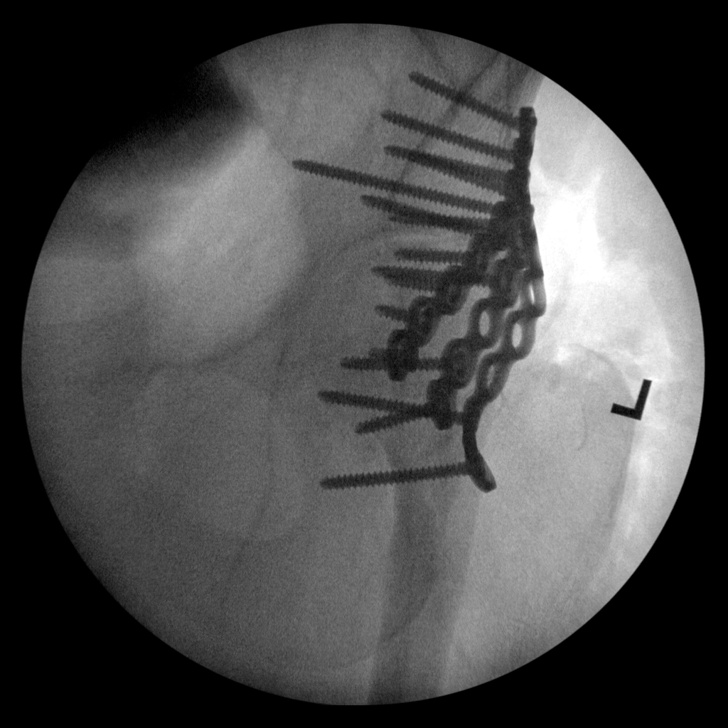
[im 7/8]
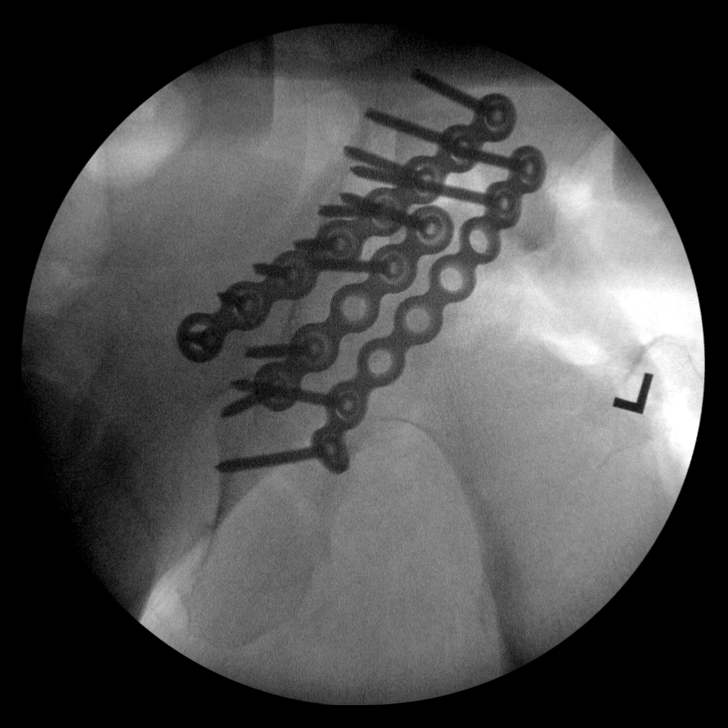
[im 8/8]
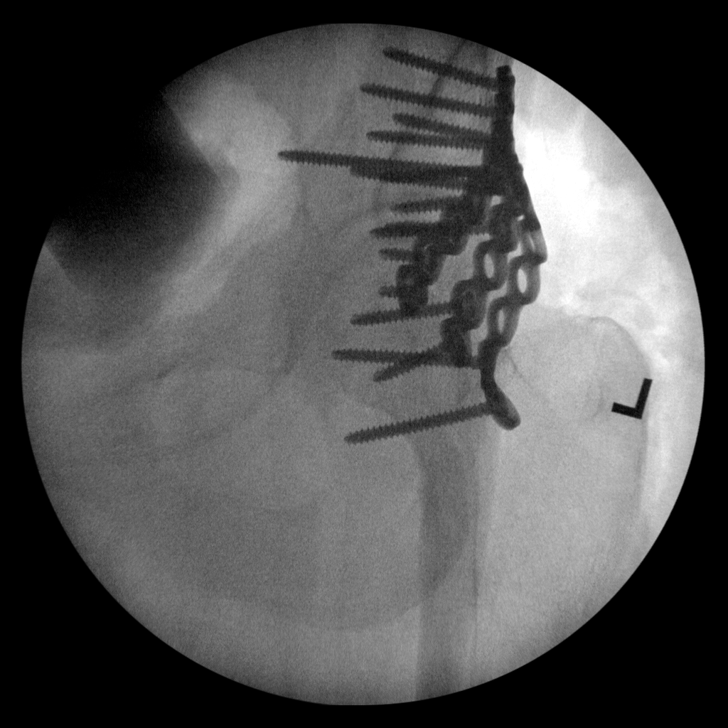

[8 of 8 positions shown; findings below may reference images not displayed]

FINDINGS: A nondisplaced fracture is seen involving the superior lateral
aspect of the left acetabulum.

Initially, four radiopaque surgical needles are seen overlying the
left acetabulum. Subsequent placement of four radiopaque fixation
plates, with multiple radiopaque fixation screws, is seen along the
superolateral aspect of the left acetabulum.

Soft tissue structures are unremarkable.
IMPRESSION: Status post open reduction and internal fixation of left acetabular
fracture.

## 2019-05-22 IMAGING — RF DG PELVIS 3+V JUDET
1 series · 8 of 8 positions shown · non-contrast
Comparison: None.

CLINICAL DATA: Open reduction internal fixation of the left
acetabulum.

EXAM:
JUDET PELVIS - 3+ VIEW

[Series 1: run · 8 of 8 slices shown]
[im 1/8]
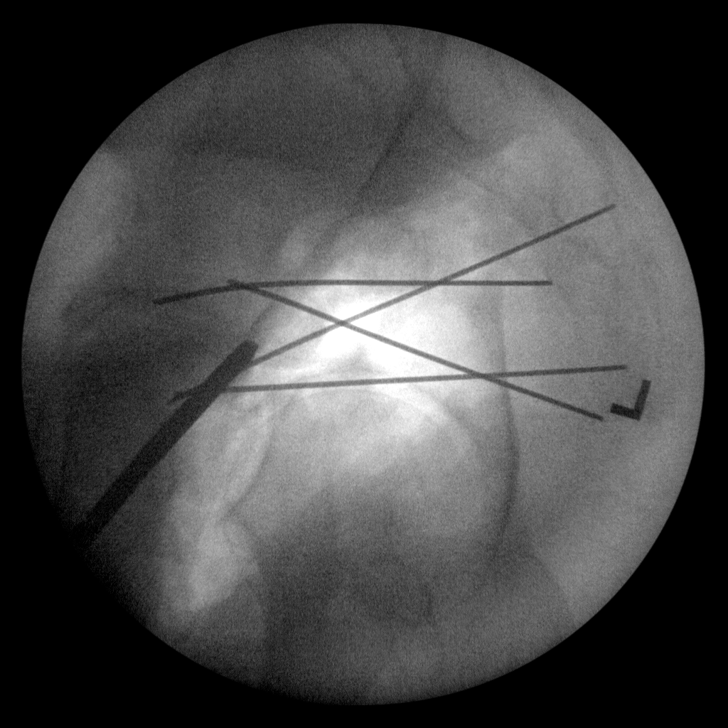
[im 2/8]
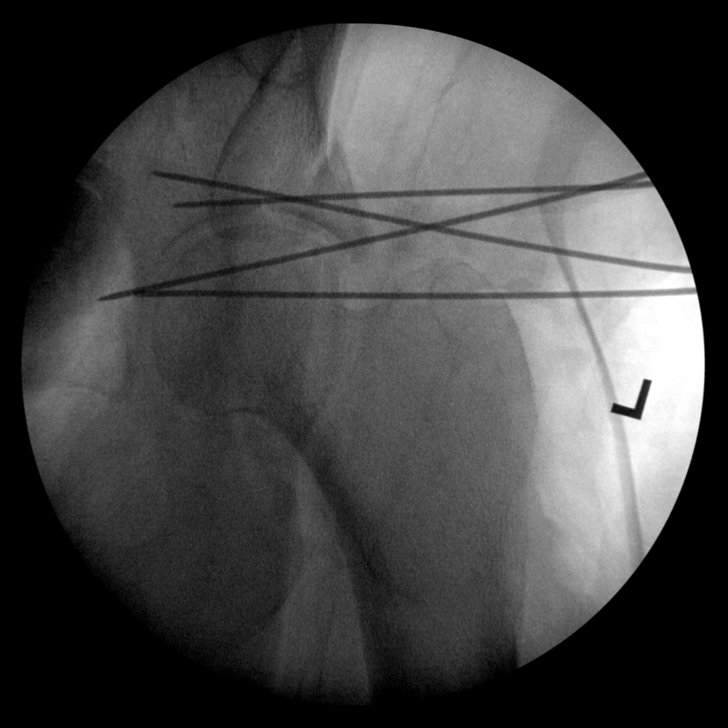
[im 3/8]
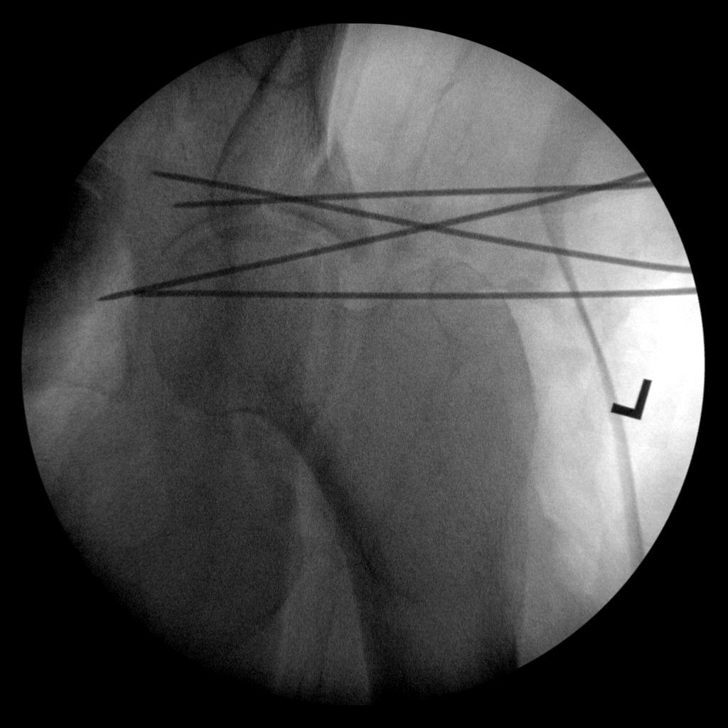
[im 4/8]
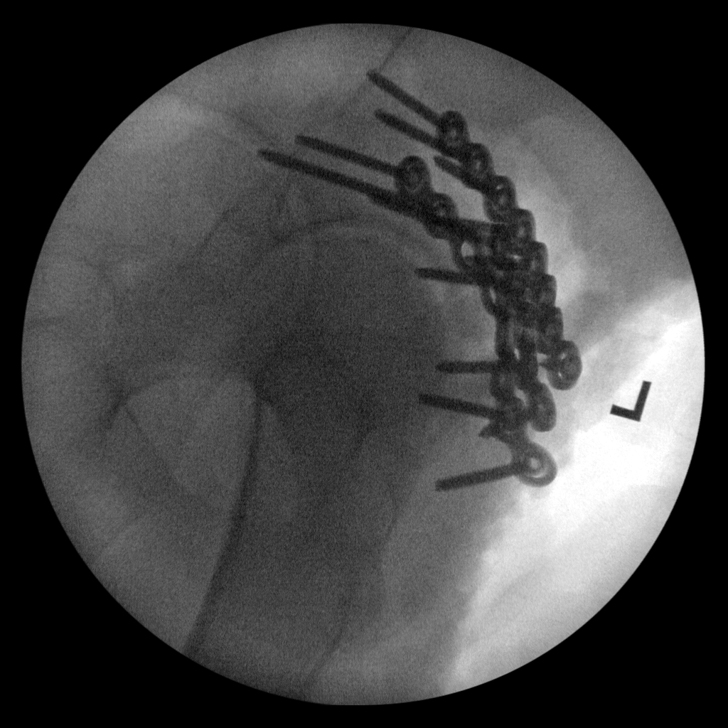
[im 5/8]
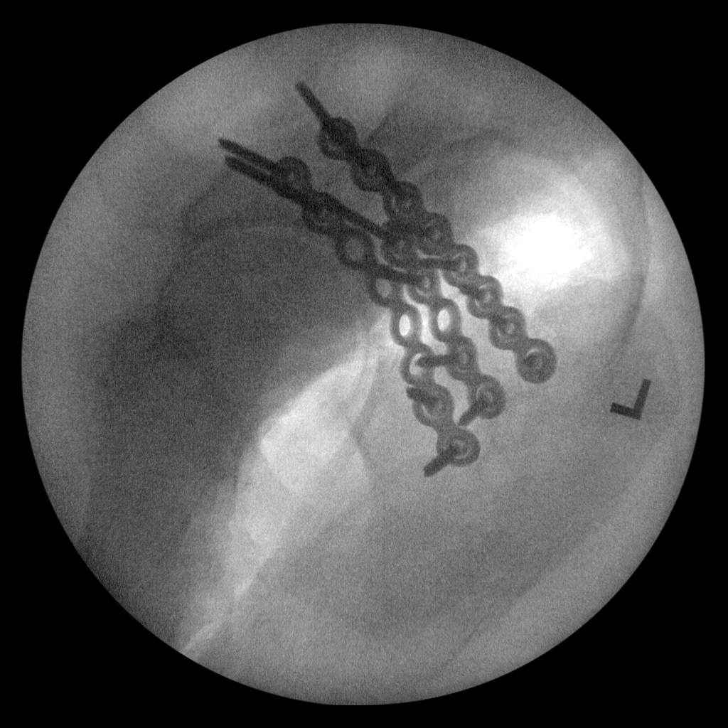
[im 6/8]
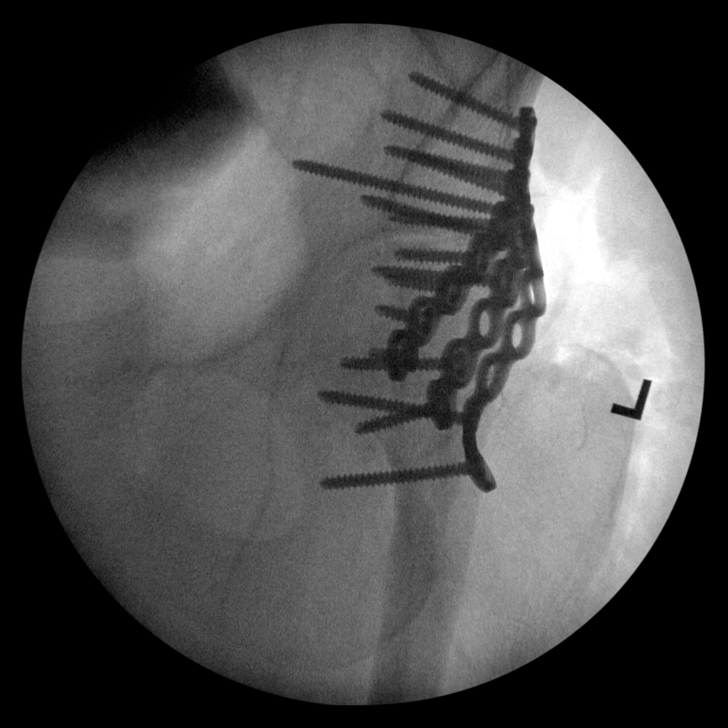
[im 7/8]
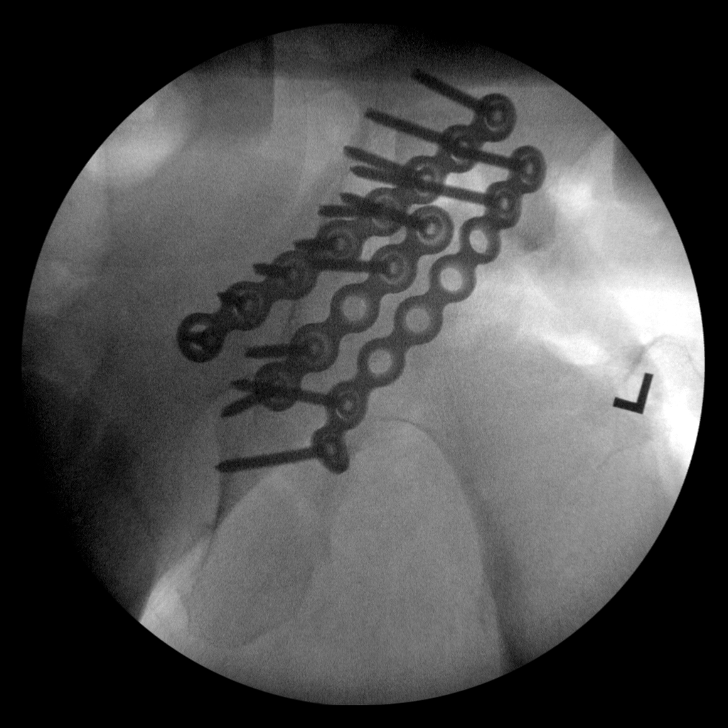
[im 8/8]
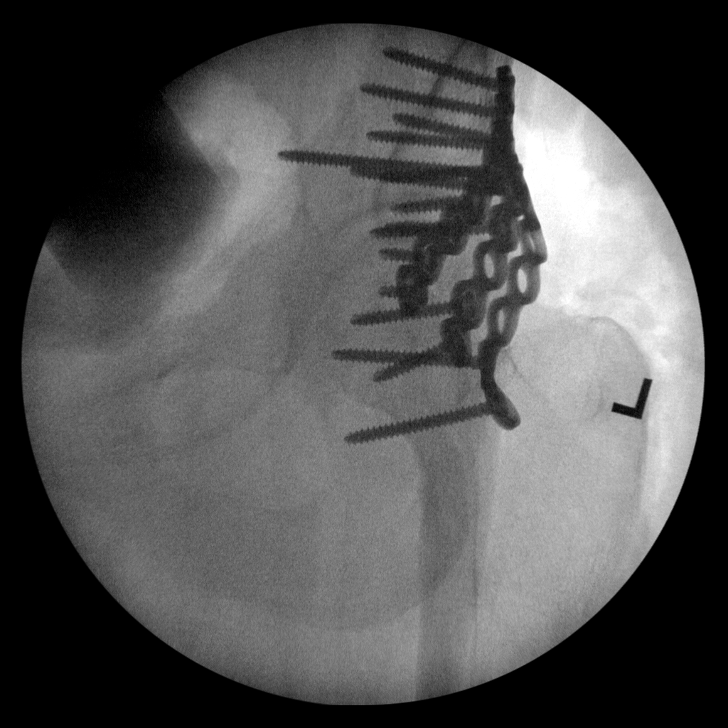

[8 of 8 positions shown; findings below may reference images not displayed]

FINDINGS: Four radiopaque fixation plates, and multiple or radiopaque
fixations are seen overlying the superolateral aspect of the left
acetabulum. There is no evidence of associated hardware loosening.

The known fracture deformity involving the left acetabulum is not
clearly identified. There is no evidence of dislocation. Soft tissue
structures are unremarkable.
IMPRESSION: Status post open reduction and internal fixation of the left
acetabulum without evidence of associated hardware loosening.

## 2019-05-22 IMAGING — DX DG CHEST 1V PORT
1 series · 1 of 1 positions shown · non-contrast
Comparison: [DATE]

CLINICAL DATA: Multiple rib fractures.

EXAM:
PORTABLE CHEST 1 VIEW

[chest]
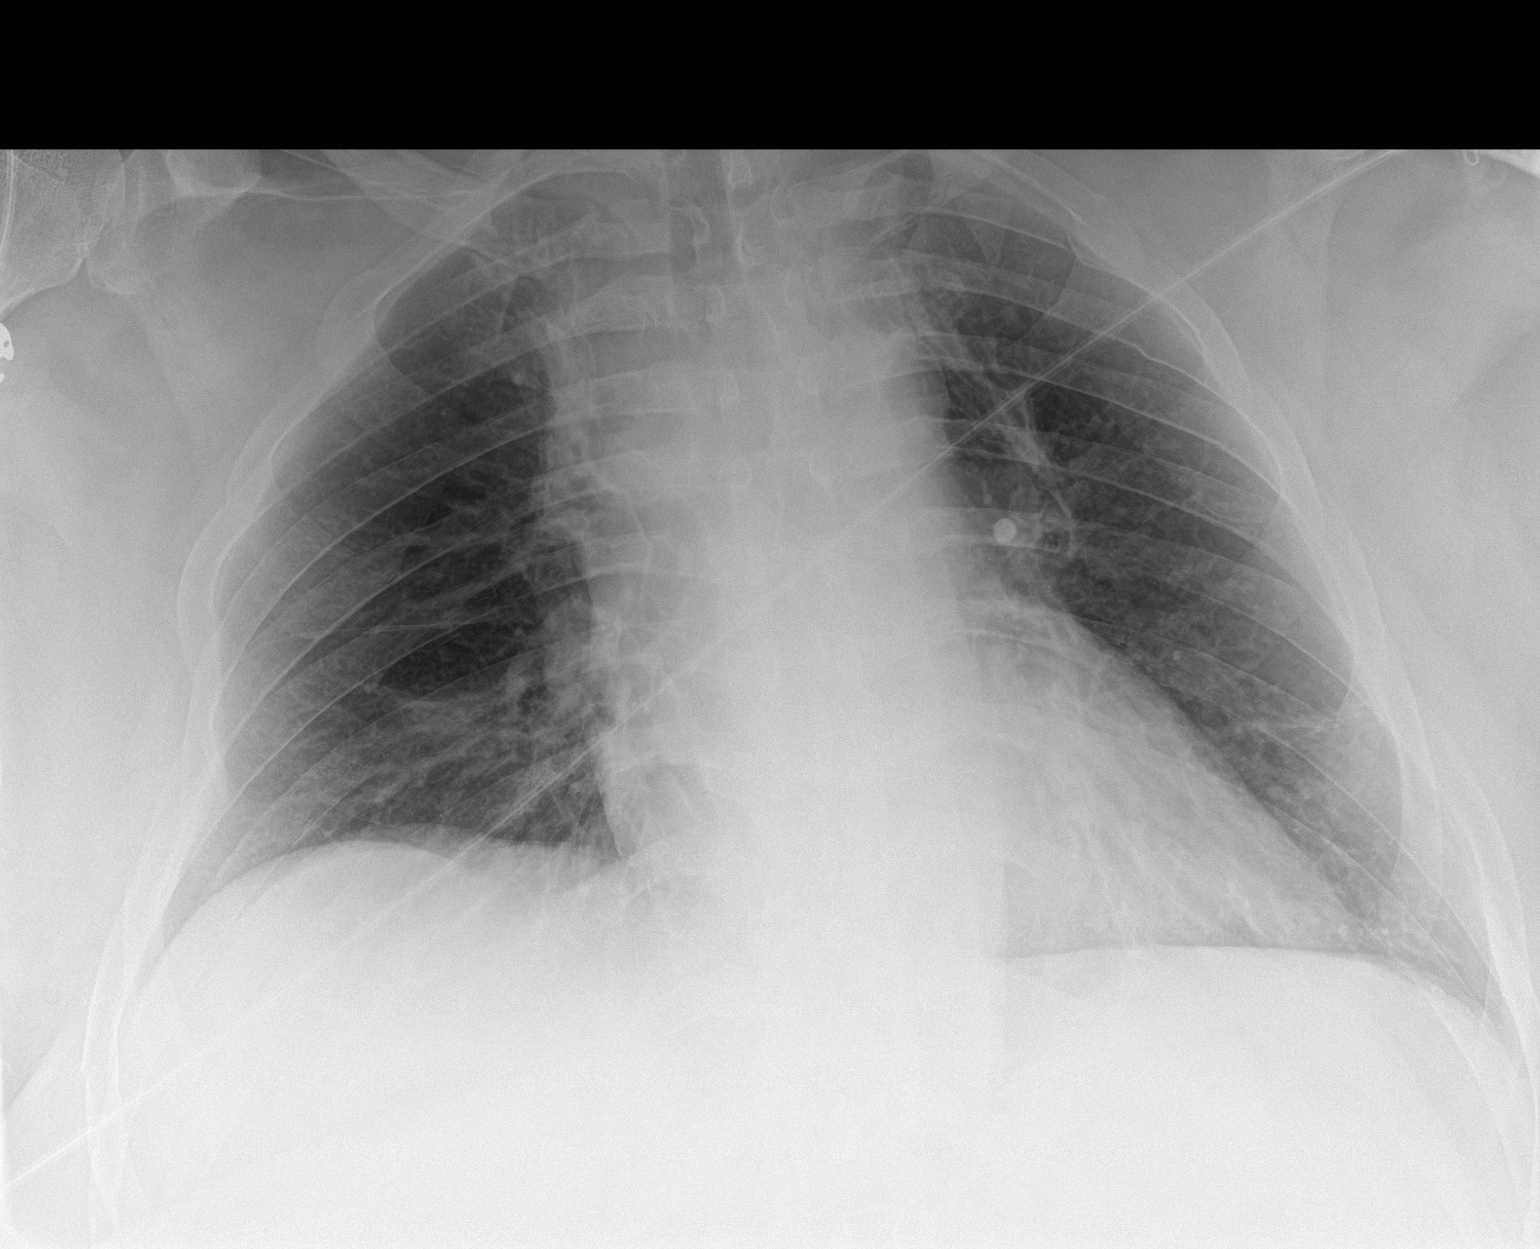

[1 of 1 positions shown; findings below may reference images not displayed]

FINDINGS: Mild worsening of paramediastinal atelectasis. No evidence of
pneumothorax. No lobar collapse or effusion. Multiple bilateral
nondisplaced rib fractures as shown previously.
IMPRESSION: Slight worsening of paramediastinal atelectasis.  No pneumothorax.

## 2019-05-22 IMAGING — DX DG PELVIS 3+V JUDET
3 series · 3 of 3 positions shown · non-contrast
Comparison: None.

CLINICAL DATA: Closed fracture

EXAM:
JUDET PELVIS - 3+ VIEW

[pelvis ap]
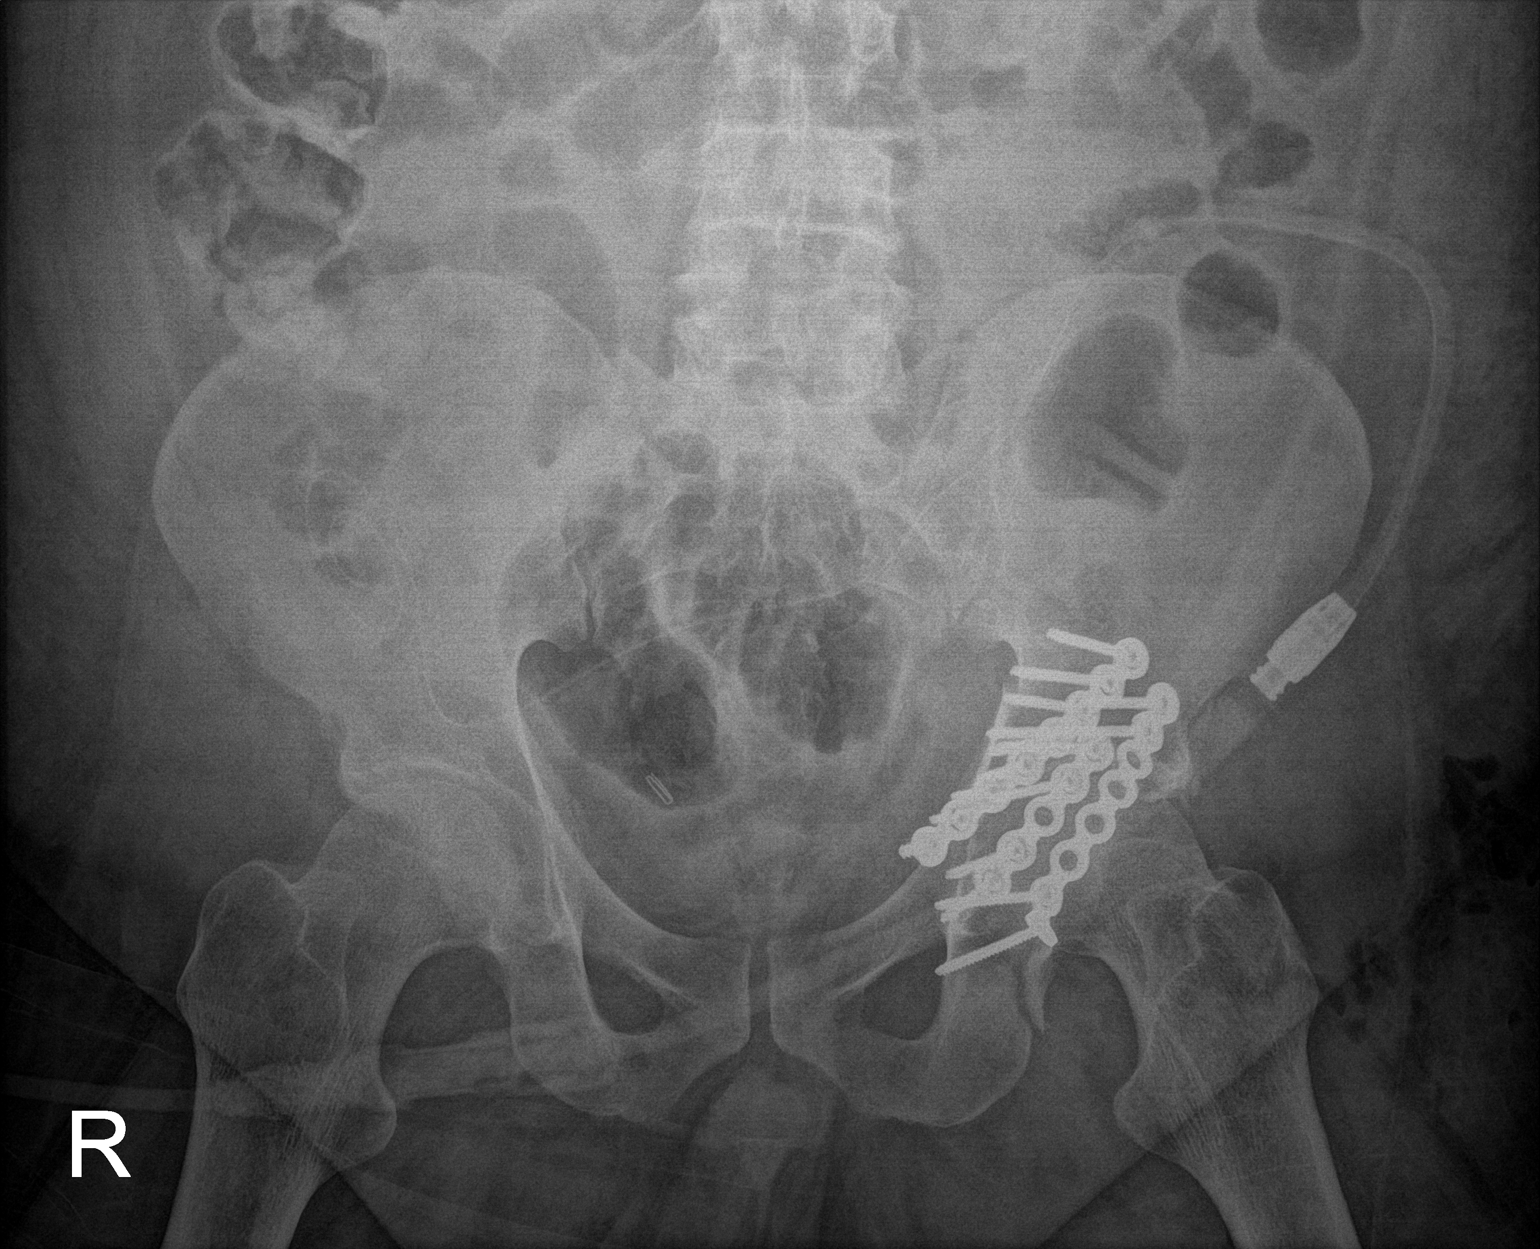

[pelvis obl (1 of 2)]
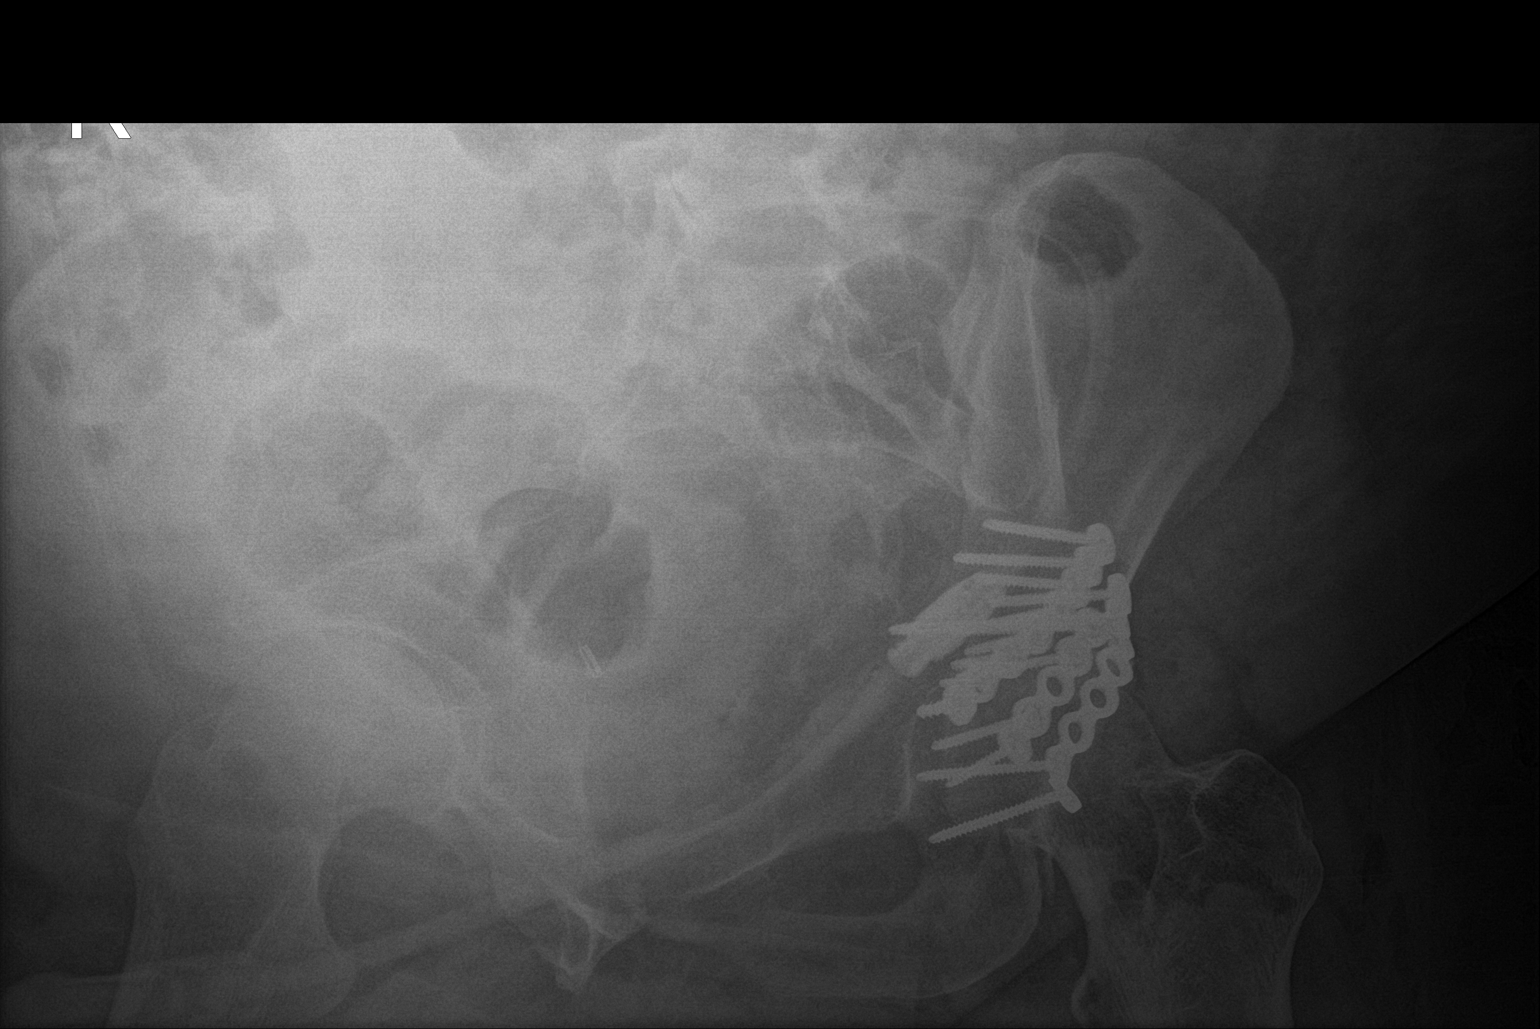

[pelvis obl (2 of 2)]
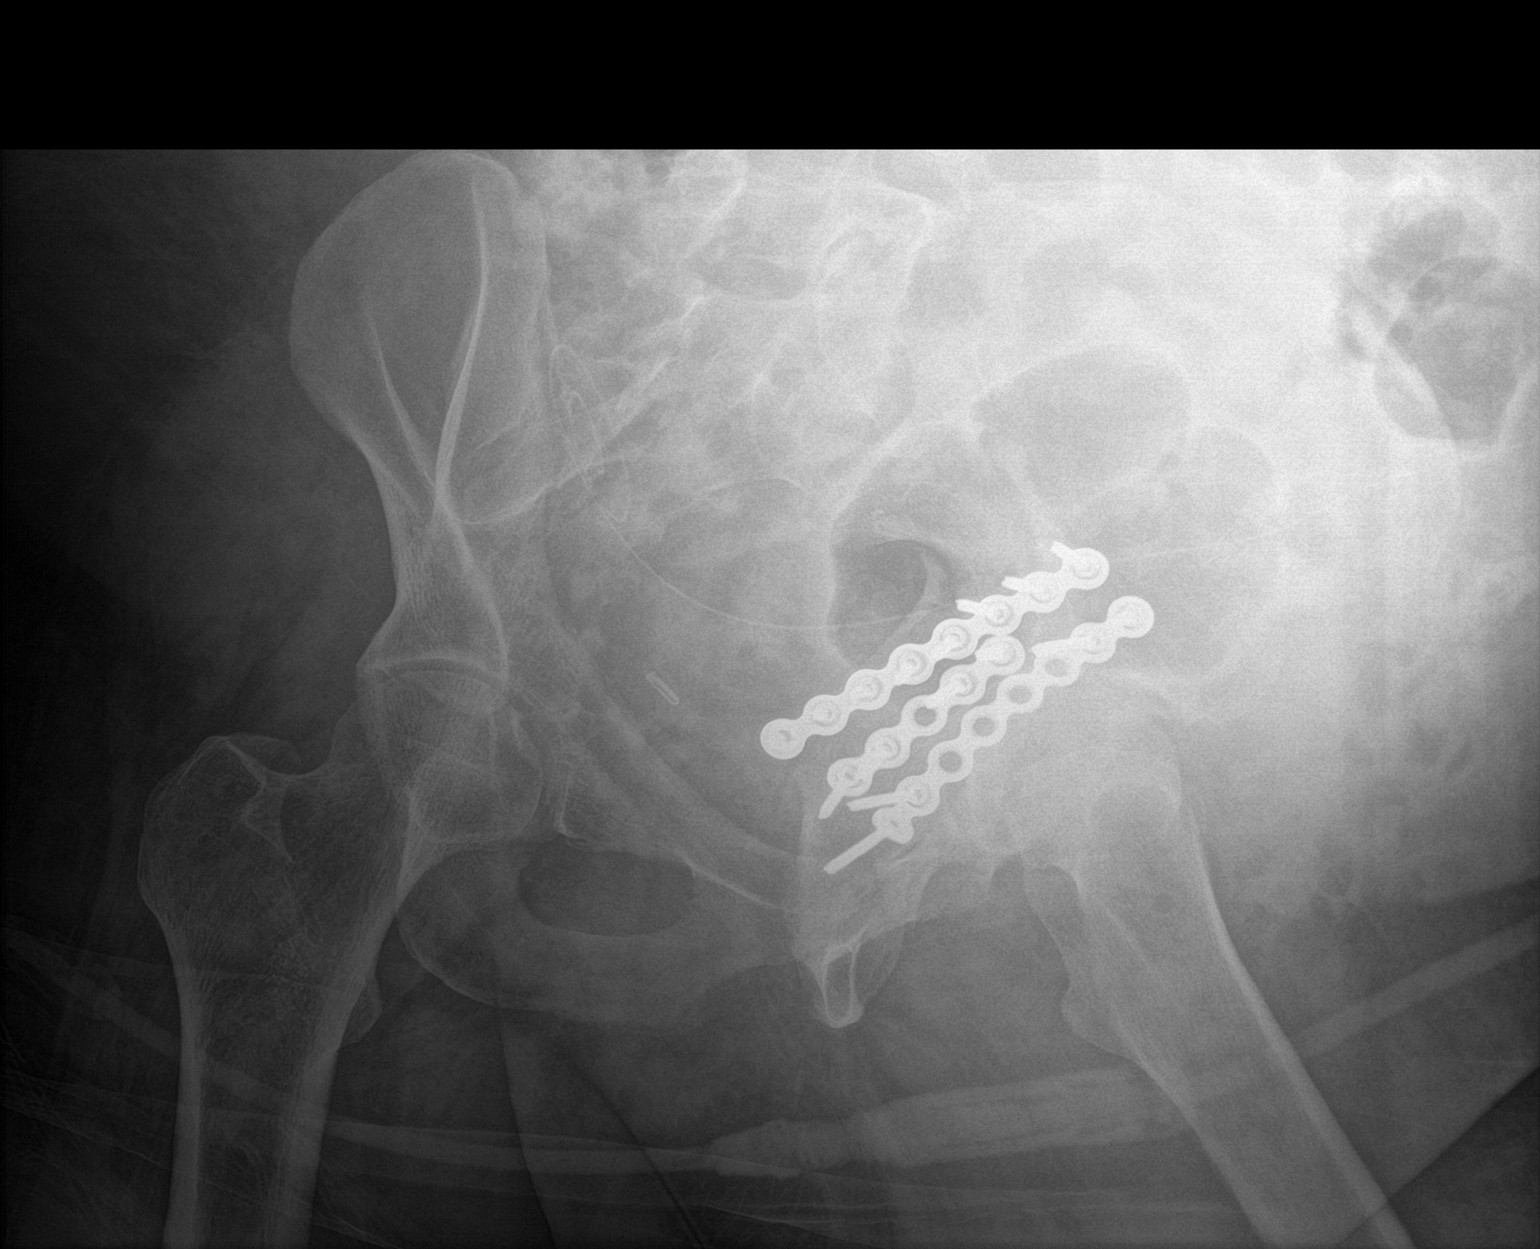

[3 of 3 positions shown; findings below may reference images not displayed]

FINDINGS: The patient is status post ORIF with plate fixation of the superior
left acetabulum. The femoral head is seated within the acetabulum.
No new fracture is seen.
IMPRESSION: Status post ORIF of the left acetabular fracture. No acute
complication.

## 2019-05-22 SURGERY — OPEN REDUCTION INTERNAL FIXATION (ORIF) ACETABULAR FRACTURE
Anesthesia: General | Site: Hip | Laterality: Left

## 2019-05-22 MED ORDER — NEOSTIGMINE METHYLSULFATE 3 MG/3ML IV SOSY
PREFILLED_SYRINGE | INTRAVENOUS | Status: DC | PRN
Start: 2019-05-22 — End: 2019-05-22
  Administered 2019-05-22: 3 mg via INTRAVENOUS

## 2019-05-22 MED ORDER — LIDOCAINE 2% (20 MG/ML) 5 ML SYRINGE
INTRAMUSCULAR | Status: DC | PRN
  Administered 2019-05-22: 60 mg via INTRAVENOUS

## 2019-05-22 MED ORDER — DEXAMETHASONE SODIUM PHOSPHATE 10 MG/ML IJ SOLN
INTRAMUSCULAR | Status: DC | PRN
  Administered 2019-05-22: 4 mg via INTRAVENOUS

## 2019-05-22 MED ORDER — SODIUM CHLORIDE 0.9 % IV SOLN
INTRAVENOUS | Status: DC | PRN
Start: 2019-05-22 — End: 2019-05-22

## 2019-05-22 MED ORDER — ONDANSETRON HCL 4 MG/2ML IJ SOLN
INTRAMUSCULAR | Status: AC
  Filled 2019-05-22: qty 2

## 2019-05-22 MED ORDER — STERILE WATER FOR IRRIGATION IR SOLN
Status: DC | PRN
  Administered 2019-05-22: 1000 mL

## 2019-05-22 MED ORDER — ROCURONIUM BROMIDE 10 MG/ML (PF) SYRINGE
PREFILLED_SYRINGE | INTRAVENOUS | Status: DC | PRN
  Administered 2019-05-22: 60 mg via INTRAVENOUS

## 2019-05-22 MED ORDER — PHENYLEPHRINE 40 MCG/ML (10ML) SYRINGE FOR IV PUSH (FOR BLOOD PRESSURE SUPPORT)
PREFILLED_SYRINGE | INTRAVENOUS | Status: AC
  Filled 2019-05-22: qty 10

## 2019-05-22 MED ORDER — VASOPRESSIN 20 UNIT/ML IV SOLN
INTRAVENOUS | Status: AC
  Filled 2019-05-22: qty 1

## 2019-05-22 MED ORDER — FENTANYL CITRATE (PF) 250 MCG/5ML IJ SOLN
INTRAMUSCULAR | Status: AC
  Filled 2019-05-22: qty 5

## 2019-05-22 MED ORDER — DEXTROSE 50 % IV SOLN: 12.5000 g | INTRAVENOUS | Status: AC

## 2019-05-22 MED ORDER — MIDAZOLAM HCL 2 MG/2ML IJ SOLN
INTRAMUSCULAR | Status: AC
  Filled 2019-05-22: qty 2

## 2019-05-22 MED ORDER — DEXTROSE 50 % IV SOLN
INTRAVENOUS | Status: AC
  Administered 2019-05-22: 25 mL
  Filled 2019-05-22: qty 50

## 2019-05-22 MED ORDER — CEFAZOLIN SODIUM-DEXTROSE 2-4 GM/100ML-% IV SOLN
2.0000 g | INTRAVENOUS | Status: AC
  Administered 2019-05-23: 2 g via INTRAVENOUS

## 2019-05-22 MED ORDER — VANCOMYCIN HCL 1000 MG IV SOLR
INTRAVENOUS | Status: DC | PRN
  Administered 2019-05-22: 1000 mg via TOPICAL

## 2019-05-22 MED ORDER — VASOPRESSIN 20 UNIT/ML IV SOLN
INTRAVENOUS | Status: DC | PRN
Start: 2019-05-22 — End: 2019-05-22
  Administered 2019-05-22 (×2): 1 [IU] via INTRAVENOUS

## 2019-05-22 MED ORDER — ONDANSETRON HCL 4 MG/2ML IJ SOLN
INTRAMUSCULAR | Status: DC | PRN
  Administered 2019-05-22: 4 mg via INTRAVENOUS

## 2019-05-22 MED ORDER — ROCURONIUM BROMIDE 10 MG/ML (PF) SYRINGE
PREFILLED_SYRINGE | INTRAVENOUS | Status: AC
  Filled 2019-05-22: qty 30

## 2019-05-22 MED ORDER — PHENYLEPHRINE HCL (PRESSORS) 10 MG/ML IV SOLN
INTRAVENOUS | Status: DC | PRN
  Administered 2019-05-22 (×3): 120 ug via INTRAVENOUS

## 2019-05-22 MED ORDER — FENTANYL CITRATE (PF) 250 MCG/5ML IJ SOLN
INTRAMUSCULAR | Status: DC | PRN
  Administered 2019-05-22: 25 ug via INTRAVENOUS
  Administered 2019-05-22: 75 ug via INTRAVENOUS

## 2019-05-22 MED ORDER — GLYCOPYRROLATE PF 0.2 MG/ML IJ SOSY
PREFILLED_SYRINGE | INTRAMUSCULAR | Status: DC | PRN
  Administered 2019-05-22: .4 mg via INTRAVENOUS

## 2019-05-22 MED ORDER — MIDAZOLAM HCL 2 MG/2ML IJ SOLN
INTRAMUSCULAR | Status: DC | PRN
  Administered 2019-05-22: 1 mg via INTRAVENOUS

## 2019-05-22 MED ORDER — SODIUM CHLORIDE 0.9 % IR SOLN
Status: DC | PRN
  Administered 2019-05-22: 3000 mL

## 2019-05-22 MED ORDER — DEXTROSE 50 % IV SOLN
INTRAVENOUS | Status: AC
  Administered 2019-05-22: 12.5 g via INTRAVENOUS
  Filled 2019-05-22: qty 50

## 2019-05-22 MED ORDER — SUCCINYLCHOLINE CHLORIDE 200 MG/10ML IV SOSY
PREFILLED_SYRINGE | INTRAVENOUS | Status: DC | PRN
  Administered 2019-05-22: 140 mg via INTRAVENOUS

## 2019-05-22 MED ORDER — VANCOMYCIN HCL 1000 MG IV SOLR
INTRAVENOUS | Status: AC
  Filled 2019-05-22: qty 1000

## 2019-05-22 MED ORDER — ALBUMIN HUMAN 5 % IV SOLN: INTRAVENOUS | Status: DC | PRN

## 2019-05-22 MED ORDER — PROPOFOL 10 MG/ML IV BOLUS
INTRAVENOUS | Status: DC | PRN
  Administered 2019-05-22: 150 mg via INTRAVENOUS

## 2019-05-22 MED ORDER — CHLORHEXIDINE GLUCONATE CLOTH 2 % EX PADS
6.0000 | MEDICATED_PAD | Freq: Every day | CUTANEOUS | Status: DC
  Administered 2019-05-22 – 2019-06-02 (×11): 6 via TOPICAL

## 2019-05-22 MED ORDER — CEFAZOLIN SODIUM-DEXTROSE 2-3 GM-%(50ML) IV SOLR
INTRAVENOUS | Status: DC | PRN
Start: 2019-05-22 — End: 2019-05-22
  Administered 2019-05-22: 2 g via INTRAVENOUS

## 2019-05-22 MED ORDER — LIDOCAINE 2% (20 MG/ML) 5 ML SYRINGE
INTRAMUSCULAR | Status: AC
  Filled 2019-05-22: qty 10

## 2019-05-22 MED ORDER — 0.9 % SODIUM CHLORIDE (POUR BTL) OPTIME
TOPICAL | Status: DC | PRN
  Administered 2019-05-22: 1000 mL

## 2019-05-22 MED ORDER — DIPHENHYDRAMINE HCL 25 MG PO CAPS
25.0000 mg | ORAL_CAPSULE | Freq: Four times a day (QID) | ORAL | Status: DC | PRN
  Administered 2019-05-22: 25 mg via ORAL
  Filled 2019-05-22 (×2): qty 1

## 2019-05-22 SURGICAL SUPPLY — 88 items
BIT DRILL AO MATTA 2.5MX230M (BIT) ×1 IMPLANT
BIT DRILL STEP 3.5 (DRILL) IMPLANT
BLADE CLIPPER SURG (BLADE) IMPLANT
BONE CANC CHIPS 20CC PCAN1/4 (Bone Implant) ×2 IMPLANT
BRUSH SCRUB EZ PLAIN DRY (MISCELLANEOUS) ×4 IMPLANT
CANISTER WOUND CARE 500ML ATS (WOUND CARE) ×2 IMPLANT
CHIPS CANC BONE 20CC PCAN1/4 (Bone Implant) ×1 IMPLANT
COVER SURGICAL LIGHT HANDLE (MISCELLANEOUS) ×2 IMPLANT
COVER WAND RF STERILE (DRAPES) IMPLANT
DRAPE C-ARM 42X72 X-RAY (DRAPES) ×2 IMPLANT
DRAPE C-ARMOR (DRAPES) ×2 IMPLANT
DRAPE INCISE IOBAN 66X45 STRL (DRAPES) ×2 IMPLANT
DRAPE INCISE IOBAN 85X60 (DRAPES) ×2 IMPLANT
DRAPE ORTHO SPLIT 77X108 STRL (DRAPES) ×2
DRAPE SURG ORHT 6 SPLT 77X108 (DRAPES) ×2 IMPLANT
DRAPE U-SHAPE 47X51 STRL (DRAPES) ×2 IMPLANT
DRESSING PREVENA PLUS CUSTOM (GAUZE/BANDAGES/DRESSINGS) ×1 IMPLANT
DRILL BIT AO MATTA 2.5MX230M (BIT) ×2
DRILL STEP 3.5 (DRILL)
DRSG MEPILEX BORDER 4X12 (GAUZE/BANDAGES/DRESSINGS) IMPLANT
DRSG MEPILEX BORDER 4X4 (GAUZE/BANDAGES/DRESSINGS) ×2 IMPLANT
DRSG MEPILEX BORDER 4X8 (GAUZE/BANDAGES/DRESSINGS) IMPLANT
DRSG PREVENA PLUS CUSTOM (GAUZE/BANDAGES/DRESSINGS) ×2
ELECT BLADE 6.5 EXT (BLADE) ×2 IMPLANT
ELECT REM PT RETURN 9FT ADLT (ELECTROSURGICAL) ×2
ELECTRODE REM PT RTRN 9FT ADLT (ELECTROSURGICAL) ×1 IMPLANT
GLOVE BIO SURGEON STRL SZ7.5 (GLOVE) IMPLANT
GLOVE BIO SURGEON STRL SZ8 (GLOVE) IMPLANT
GLOVE BIOGEL PI IND STRL 6.5 (GLOVE) ×2 IMPLANT
GLOVE BIOGEL PI IND STRL 7.5 (GLOVE) ×1 IMPLANT
GLOVE BIOGEL PI IND STRL 8 (GLOVE) ×1 IMPLANT
GLOVE BIOGEL PI INDICATOR 6.5 (GLOVE) ×2
GLOVE BIOGEL PI INDICATOR 7.5 (GLOVE) ×1
GLOVE BIOGEL PI INDICATOR 8 (GLOVE) ×1
GLOVE SURG SS PI 6.5 STRL IVOR (GLOVE) ×8 IMPLANT
GLOVE SURG SS PI 7.5 STRL IVOR (GLOVE) ×2 IMPLANT
GLOVE SURG SS PI 8.0 STRL IVOR (GLOVE) ×4 IMPLANT
GOWN STRL REIN XL XLG (GOWN DISPOSABLE) ×2 IMPLANT
GOWN STRL REUS W/ TWL LRG LVL3 (GOWN DISPOSABLE) ×4 IMPLANT
GOWN STRL REUS W/ TWL XL LVL3 (GOWN DISPOSABLE) ×2 IMPLANT
GOWN STRL REUS W/TWL LRG LVL3 (GOWN DISPOSABLE) ×4
GOWN STRL REUS W/TWL XL LVL3 (GOWN DISPOSABLE) ×2
HANDPIECE INTERPULSE COAX TIP (DISPOSABLE) ×1
KIT BASIN OR (CUSTOM PROCEDURE TRAY) ×2 IMPLANT
KIT TURNOVER KIT B (KITS) ×2 IMPLANT
MANIFOLD NEPTUNE II (INSTRUMENTS) ×2 IMPLANT
NS IRRIG 1000ML POUR BTL (IV SOLUTION) ×2 IMPLANT
PACK TOTAL JOINT (CUSTOM PROCEDURE TRAY) ×2 IMPLANT
PAD ARMBOARD 7.5X6 YLW CONV (MISCELLANEOUS) ×4 IMPLANT
PIN APEX 6.0X250MM EXFIX (EXFIX) ×2 IMPLANT
PLATE ACET STRT 94.5M 8H (Plate) ×4 IMPLANT
PLATE BONE MATTA 10.5X58.5 H5 (Plate) ×2 IMPLANT
RETRACTOR YANK SUCT EIGR SABER (INSTRUMENTS) ×2 IMPLANT
RETRIEVER SUT HEWSON (MISCELLANEOUS) ×2 IMPLANT
SCREW CORT 10X3.5XST NS LF (Screw) ×1 IMPLANT
SCREW CORTEX ST MATTA 3.5X12MM (Screw) ×2 IMPLANT
SCREW CORTEX ST MATTA 3.5X16MM (Screw) ×2 IMPLANT
SCREW CORTEX ST MATTA 3.5X18MM (Screw) ×2 IMPLANT
SCREW CORTEX ST MATTA 3.5X20 (Screw) ×2 IMPLANT
SCREW CORTEX ST MATTA 3.5X22MM (Screw) ×2 IMPLANT
SCREW CORTEX ST MATTA 3.5X26MM (Screw) ×6 IMPLANT
SCREW CORTEX ST MATTA 3.5X30MM (Screw) ×4 IMPLANT
SCREW CORTEX ST MATTA 3.5X32MM (Screw) ×2 IMPLANT
SCREW CORTEX ST MATTA 3.5X34MM (Screw) ×8 IMPLANT
SCREW CORTEX ST MATTA 3.5X38M (Screw) ×2 IMPLANT
SCREW CORTEX ST MATTA 3.5X55MM (Screw) ×2 IMPLANT
SCREW CORTEX ST MATTA 4.5X14MM (Screw) ×2 IMPLANT
SCREW CORTICAL 3.5 (Screw) ×1 IMPLANT
SET HNDPC FAN SPRY TIP SCT (DISPOSABLE) ×1 IMPLANT
SPONGE LAP 18X18 RF (DISPOSABLE) ×2 IMPLANT
STAPLER VISISTAT 35W (STAPLE) ×2 IMPLANT
STRIP CLOSURE SKIN 1/2X4 (GAUZE/BANDAGES/DRESSINGS) ×2 IMPLANT
SUCTION FRAZIER HANDLE 10FR (MISCELLANEOUS) ×1
SUCTION TUBE FRAZIER 10FR DISP (MISCELLANEOUS) ×1 IMPLANT
SUT ETHILON 2 0 PSLX (SUTURE) ×4 IMPLANT
SUT FIBERWIRE #2 38 T-5 BLUE (SUTURE) ×6
SUT VIC AB 0 CT1 27 (SUTURE) ×2
SUT VIC AB 0 CT1 27XBRD ANBCTR (SUTURE) ×2 IMPLANT
SUT VIC AB 1 CT1 18XCR BRD 8 (SUTURE) ×3 IMPLANT
SUT VIC AB 1 CT1 27 (SUTURE) ×1
SUT VIC AB 1 CT1 27XBRD ANBCTR (SUTURE) ×1 IMPLANT
SUT VIC AB 1 CT1 8-18 (SUTURE) ×3
SUT VIC AB 2-0 CT1 27 (SUTURE) ×3
SUT VIC AB 2-0 CT1 TAPERPNT 27 (SUTURE) ×3 IMPLANT
SUTURE FIBERWR #2 38 T-5 BLUE (SUTURE) ×3 IMPLANT
TOWEL GREEN STERILE (TOWEL DISPOSABLE) ×4 IMPLANT
TOWEL GREEN STERILE FF (TOWEL DISPOSABLE) ×4 IMPLANT
WATER STERILE IRR 1000ML POUR (IV SOLUTION) IMPLANT
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
## 2019-05-22 NOTE — Brief Op Note (Signed)
05/20/2019 - 05/22/2019  9:52 PM  PATIENT:  Max Pittman.  44 y.o. male  PRE-OPERATIVE DIAGNOSIS:  1. LEFT T TYPE ACETABULAR FRACTURE WITH ASSOCIATED POSTERIOR WALL 2. TRACTION PIN FOR INTRA-ARTICULAR FRAGMENTS  POST-OPERATIVE DIAGNOSIS:  1. LEFT T TYPE ACETABULAR FRACTURE WITH ASSOCIATED POSTERIOR WALL 2. TRACTION PIN FOR INTRA-ARTICULAR FRAGMENTS  PROCEDURE:  Procedure(s): 1. OPEN REDUCTION INTERNAL FIXATION (ORIF)T TYPE WITH ASSOCIATED POSTERIOR WALL ACETABULAR FRACTURE - LEFT (Left) 2. REMOVAL OF TRACTION PIN LEFT TIBIA  SURGEON:  Surgeon(s) and Role:    [place], MD - Primary  PHYSICIAN ASSISTANT: Ainsley Spinner, PA-C  ANESTHESIA:   general  EBL:  500 mL   BLOOD ADMINISTERED:none  DRAINS: none   LOCAL MEDICATIONS USED:  NONE  SPECIMEN:  No Specimen  DISPOSITION OF SPECIMEN:  N/A  COUNTS:  YES  TOURNIQUET:  * No tourniquets in log *  DICTATION: .Other Dictation: Dictation Number [id number]  PLAN OF CARE: Admit to inpatient   PATIENT DISPOSITION:  PACU - hemodynamically stable.   Delay start of Pharmacological VTE agent (>24hrs) due to surgical blood loss or risk of bleeding: no
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
## 2019-05-22 NOTE — Progress Notes (Signed)
PD machine continues to alarm patient's line is blocked. Spoke with HD RN to troubleshoot and after about an hour of RN not able to restart PD machine HD RN gave directions to TURN OFF PD machine and HD RN would come troubleshoot in person around 0200.

## 2019-05-22 NOTE — Social Work (Signed)
CSW was unable to complete sbirt due to pt being at a procedure. CSW may attempt to complete at more appropriate time.   Emeterio Reeve, Latanya Presser, [place] Social Worker [phone]

## 2019-05-22 NOTE — Anesthesia Postprocedure Evaluation (Signed)
Anesthesia Post Note  Patient: Kona Lover.  Procedure(s) Performed: OPEN REDUCTION INTERNAL FIXATION (ORIF) ACETABULAR FRACTURE - LEFT (Left Leg Upper) Application Of Wound Vac LEFT LATERAL THIGH (Left Thigh)     Patient location during evaluation: PACU Anesthesia Type: General Level of consciousness: awake and alert, oriented and patient cooperative Pain management: pain level controlled Vital Signs Assessment: post-procedure vital signs reviewed and stable Respiratory status: spontaneous breathing, nonlabored ventilation, respiratory function stable and patient connected to nasal cannula oxygen Cardiovascular status: blood pressure returned to baseline and stable Postop Assessment: no apparent nausea or vomiting Anesthetic complications: no    Last Vitals:  Vitals:   05/22/19 2219 05/22/19 2234  BP: (!) 171/112 (!) 176/71  Pulse:  91  Resp: (!) 26 19  Temp: 36.4 C   SpO2: 99% 99%    Last Pain:  Vitals:   05/22/19 2234  TempSrc:   PainSc: 0-No pain                 Leno Mathes,E. Shalisha Clausing

## 2019-05-22 NOTE — Progress Notes (Signed)
Patient requesting medication for itching, [facility] made aware through [facility] system and orders received.Marriah Sanderlin, Bettina Gavia RN
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
## 2019-05-22 NOTE — Progress Notes (Signed)
  Echocardiogram 2D Echocardiogram has been performed.  Matilde Bash 05/22/2019, 2:03 PM
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
## 2019-05-22 NOTE — Progress Notes (Signed)
EEG complete - results pending 
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
## 2019-05-22 NOTE — Anesthesia Preprocedure Evaluation (Addendum)
Anesthesia Evaluation  Patient identified by MRN, date of birth, ID band Patient awake    Reviewed: Allergy & Precautions, H&P , NPO status , Patient's Chart, lab work & pertinent test results, reviewed documented beta blocker date and time   Airway Mallampati: III  TM Distance: >3 FB Neck ROM: Full    Dental no notable dental hx. (+) Teeth Intact, Dental Advisory Given   Pulmonary neg pulmonary ROS, former smoker,    Pulmonary exam normal breath sounds clear to auscultation       Cardiovascular hypertension, Pt. on medications and Pt. on home beta blockers  Rhythm:Regular Rate:Normal     Neuro/Psych negative neurological ROS  negative psych ROS   GI/Hepatic negative GI ROS, Neg liver ROS,   Endo/Other  diabetes, Type 2, Oral Hypoglycemic AgentsMorbid obesity  Renal/GU ESRF and DialysisRenal disease  negative genitourinary   Musculoskeletal   Abdominal   Peds  Hematology negative hematology ROS (+)   Anesthesia Other Findings   Reproductive/Obstetrics negative OB ROS                            Anesthesia Physical Anesthesia Plan  ASA: III  Anesthesia Plan: General   Post-op Pain Management:    Induction: Intravenous  PONV Risk Score and Plan: 3 and Ondansetron, Dexamethasone and Midazolam  Airway Management Planned: Oral ETT  Additional Equipment:   Intra-op Plan:   Post-operative Plan: Extubation in OR  Informed Consent: I have reviewed the patients History and Physical, chart, labs and discussed the procedure including the risks, benefits and alternatives for the proposed anesthesia with the patient or authorized representative who has indicated his/her understanding and acceptance.     Dental advisory given  Plan Discussed with: CRNA  Anesthesia Plan Comments:         Anesthesia Quick Evaluation

## 2019-05-22 NOTE — Anesthesia Procedure Notes (Signed)
Procedure Name: Intubation Date/Time: 05/22/2019 5:02 PM Performed by: Jearld Pies, CRNA Pre-anesthesia Checklist: Patient identified, Emergency Drugs available, Suction available and Patient being monitored Patient Re-evaluated:Patient Re-evaluated prior to induction Oxygen Delivery Method: Circle System Utilized Preoxygenation: Pre-oxygenation with 100% oxygen Induction Type: IV induction Ventilation: Oral airway inserted - appropriate to patient size and Two handed mask ventilation required Laryngoscope Size: Miller and 2 Grade View: Grade I Tube type: Oral Tube size: 7.5 mm Number of attempts: 1 Airway Equipment and Method: Stylet and Oral airway Placement Confirmation: ETT inserted through vocal cords under direct vision,  positive ETCO2 and breath sounds checked- equal and bilateral Secured at: 23 cm Tube secured with: Tape Dental Injury: Teeth and Oropharynx as per pre-operative assessment
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
## 2019-05-22 NOTE — Progress Notes (Addendum)
Renal Navigator received call from Dr. Bartholomew Boards stating that patient will need to rest his PD catheter and transition to HD for a period of time.  Navigator contacted Theress[facility] Stamps to inform them of situation and request seat schedule for HD.  Patient will have a MWF second shift seat in the [facility] clinic. Navigator will continue to follow and will update clinic when discharge approaches in order to get exact appointment time. Nephrologist updated. Medical records faxed to clinic to provide continuity of care.  Alphonzo Cruise, [place] Renal Navigator [phone]

## 2019-05-22 NOTE — Transfer of Care (Signed)
Immediate Anesthesia Transfer of Care Note  Patient: Max Pittman.  Procedure(s) Performed: OPEN REDUCTION INTERNAL FIXATION (ORIF) ACETABULAR FRACTURE - LEFT (Left Leg Upper)  Patient Location: PACU  Anesthesia Type:General  Level of Consciousness: awake, alert , oriented and patient cooperative  Airway & Oxygen Therapy: Patient Spontanous Breathing and Patient connected to nasal cannula oxygen  Post-op Assessment: Report given to RN, Post -op Vital signs reviewed and stable and Patient moving all extremities X 4  Post vital signs: Reviewed and stable  Last Vitals:  Vitals Value Taken Time  BP 171/112 05/22/19 2221  Temp    Pulse    Resp 20 05/22/19 2223  SpO2    Vitals shown include unvalidated device data.  Last Pain:  Vitals:   05/22/19 1528  TempSrc:   PainSc: 7       Patients Stated Pain Goal: 2 (01/18/81 5003)  Complications: No apparent anesthesia complications
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
## 2019-05-23 ENCOUNTER — Inpatient Hospital Stay (HOSPITAL_COMMUNITY): Payer: Medicare Other

## 2019-05-23 DIAGNOSIS — I6340 Cerebral infarction due to embolism of unspecified cerebral artery: Secondary | ICD-10-CM | POA: Insufficient documentation

## 2019-05-23 HISTORY — PX: IR US GUIDE VASC ACCESS RIGHT: IMG2390

## 2019-05-23 HISTORY — PX: IR FLUORO GUIDE CV LINE RIGHT: IMG2283

## 2019-05-23 LAB — CBC
HCT: 28.7 % — ABNORMAL LOW (ref 39.0–52.0)
Hemoglobin: 9.2 g/dL — ABNORMAL LOW (ref 13.0–17.0)
MCH: 29.3 pg (ref 26.0–34.0)
MCHC: 32.1 g/dL (ref 30.0–36.0)
MCV: 91.4 fL (ref 80.0–100.0)
Platelets: 142 10*3/uL — ABNORMAL LOW (ref 150–400)
RBC: 3.14 MIL/uL — ABNORMAL LOW (ref 4.22–5.81)
RDW: 14.5 % (ref 11.5–15.5)
WBC: 9.9 10*3/uL (ref 4.0–10.5)
nRBC: 0 % (ref 0.0–0.2)

## 2019-05-23 LAB — TYPE AND SCREEN
ABO/RH(D): A POS
Antibody Screen: NEGATIVE

## 2019-05-23 LAB — HEPATIC FUNCTION PANEL
ALT: 35 U/L (ref 0–44)
AST: 126 U/L — ABNORMAL HIGH (ref 15–41)
Albumin: 2.3 g/dL — ABNORMAL LOW (ref 3.5–5.0)
Alkaline Phosphatase: 59 U/L (ref 38–126)
Bilirubin, Direct: 0.1 mg/dL (ref 0.0–0.2)
Indirect Bilirubin: 0.9 mg/dL (ref 0.3–0.9)
Total Bilirubin: 1 mg/dL (ref 0.3–1.2)
Total Protein: 5.3 g/dL — ABNORMAL LOW (ref 6.5–8.1)

## 2019-05-23 LAB — HEPATITIS B SURFACE ANTIBODY,QUALITATIVE: Hep B S Ab: NONREACTIVE

## 2019-05-23 LAB — RENAL FUNCTION PANEL
Albumin: 2.2 g/dL — ABNORMAL LOW (ref 3.5–5.0)
Anion gap: 13 (ref 5–15)
BUN: 52 mg/dL — ABNORMAL HIGH (ref 6–20)
CO2: 23 mmol/L (ref 22–32)
Calcium: 8.1 mg/dL — ABNORMAL LOW (ref 8.9–10.3)
Chloride: 97 mmol/L — ABNORMAL LOW (ref 98–111)
Creatinine, Ser: 14.43 mg/dL — ABNORMAL HIGH (ref 0.61–1.24)
GFR calc Af Amer: 4 mL/min — ABNORMAL LOW (ref >60)
GFR calc non Af Amer: 4 mL/min — ABNORMAL LOW (ref >60)
Glucose, Bld: 118 mg/dL — ABNORMAL HIGH (ref 70–99)
Phosphorus: 6.5 mg/dL — ABNORMAL HIGH (ref 2.5–4.6)
Potassium: 3.1 mmol/L — ABNORMAL LOW (ref 3.5–5.1)
Sodium: 133 mmol/L — ABNORMAL LOW (ref 135–145)

## 2019-05-23 LAB — GLUCOSE, CAPILLARY
Glucose-Capillary: 114 mg/dL — ABNORMAL HIGH (ref 70–99)
Glucose-Capillary: 135 mg/dL — ABNORMAL HIGH (ref 70–99)
Glucose-Capillary: 130 mg/dL — ABNORMAL HIGH (ref 70–99)
Glucose-Capillary: 130 mg/dL — ABNORMAL HIGH (ref 70–99)
Glucose-Capillary: 135 mg/dL — ABNORMAL HIGH (ref 70–99)

## 2019-05-23 LAB — HEPATITIS B CORE ANTIBODY, TOTAL: Hep B Core Total Ab: NONREACTIVE

## 2019-05-23 LAB — HEPATITIS B SURFACE ANTIGEN: Hepatitis B Surface Ag: NONREACTIVE
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
## 2019-05-23 MED ORDER — LIDOCAINE HCL 1 % IJ SOLN
INTRAMUSCULAR | Status: AC
  Filled 2019-05-23: qty 20

## 2019-05-23 MED ORDER — HEPARIN SODIUM (PORCINE) 1000 UNIT/ML DIALYSIS
1000.0000 [IU] | INTRAMUSCULAR | Status: DC | PRN
  Filled 2019-05-23: qty 1

## 2019-05-23 MED ORDER — MIDAZOLAM HCL 2 MG/2ML IJ SOLN
INTRAMUSCULAR | Status: AC
  Filled 2019-05-23: qty 2

## 2019-05-23 MED ORDER — PREGABALIN 25 MG PO CAPS: 25.0000 mg | ORAL_CAPSULE | Freq: Every day | ORAL | Status: DC

## 2019-05-23 MED ORDER — SODIUM CHLORIDE 0.9 % IV SOLN: 100.0000 mL | INTRAVENOUS | Status: DC | PRN

## 2019-05-23 MED ORDER — FENTANYL CITRATE (PF) 100 MCG/2ML IJ SOLN
INTRAMUSCULAR | Status: AC
  Filled 2019-05-23: qty 2

## 2019-05-23 MED ORDER — LIDOCAINE HCL (PF) 1 % IJ SOLN: 5.0000 mL | INTRAMUSCULAR | Status: DC | PRN

## 2019-05-23 MED ORDER — CEFAZOLIN SODIUM-DEXTROSE 2-4 GM/100ML-% IV SOLN
INTRAVENOUS | Status: AC
  Filled 2019-05-23: qty 100

## 2019-05-23 MED ORDER — PREGABALIN 25 MG PO CAPS
25.0000 mg | ORAL_CAPSULE | Freq: Every day | ORAL | Status: DC
  Administered 2019-05-23: 25 mg via ORAL
  Filled 2019-05-23: qty 1

## 2019-05-23 MED ORDER — POLYETHYLENE GLYCOL 3350 17 G PO PACK: 17.0000 g | PACK | Freq: Every day | ORAL | Status: DC | PRN

## 2019-05-23 MED ORDER — MIDAZOLAM HCL 2 MG/2ML IJ SOLN
INTRAMUSCULAR | Status: AC | PRN
  Administered 2019-05-23: 1 mg via INTRAVENOUS

## 2019-05-23 MED ORDER — LIDOCAINE-PRILOCAINE 2.5-2.5 % EX CREA
1.0000 "application " | TOPICAL_CREAM | CUTANEOUS | Status: DC | PRN
  Filled 2019-05-23: qty 5

## 2019-05-23 MED ORDER — FENTANYL CITRATE (PF) 100 MCG/2ML IJ SOLN
INTRAMUSCULAR | Status: AC | PRN
  Administered 2019-05-23: 50 ug via INTRAVENOUS

## 2019-05-23 MED ORDER — ALTEPLASE 2 MG IJ SOLR: 2.0000 mg | Freq: Once | INTRAMUSCULAR | Status: DC | PRN

## 2019-05-23 MED ORDER — LIDOCAINE HCL 1 % IJ SOLN
INTRAMUSCULAR | Status: AC | PRN
  Administered 2019-05-23: 10 mL

## 2019-05-23 MED ORDER — VITAMIN D 25 MCG (1000 UNIT) PO TABS
2000.0000 [IU] | ORAL_TABLET | Freq: Two times a day (BID) | ORAL | Status: DC
  Administered 2019-05-23 – 2019-06-02 (×19): 2000 [IU] via ORAL
  Filled 2019-05-23 (×19): qty 2

## 2019-05-23 MED ORDER — MUPIROCIN 2 % EX OINT
1.0000 "application " | TOPICAL_OINTMENT | Freq: Two times a day (BID) | CUTANEOUS | Status: AC
  Administered 2019-05-23 – 2019-05-28 (×8): 1 via NASAL
  Filled 2019-05-23 (×2): qty 22

## 2019-05-23 MED ORDER — METHOCARBAMOL 500 MG PO TABS
1000.0000 mg | ORAL_TABLET | Freq: Three times a day (TID) | ORAL | Status: DC
  Administered 2019-05-23 – 2019-05-27 (×13): 1000 mg via ORAL
  Filled 2019-05-23 (×13): qty 2

## 2019-05-23 MED ORDER — PENTAFLUOROPROP-TETRAFLUOROETH EX AERO: 1.0000 "application " | INHALATION_SPRAY | CUTANEOUS | Status: DC | PRN

## 2019-05-23 MED ORDER — HEPARIN SODIUM (PORCINE) 5000 UNIT/ML IJ SOLN
5000.0000 [IU] | Freq: Three times a day (TID) | INTRAMUSCULAR | Status: DC
  Administered 2019-05-23 – 2019-05-26 (×7): 5000 [IU] via SUBCUTANEOUS
  Filled 2019-05-23 (×7): qty 1

## 2019-05-23 MED ORDER — CARVEDILOL 25 MG PO TABS
25.0000 mg | ORAL_TABLET | Freq: Two times a day (BID) | ORAL | Status: DC
  Administered 2019-05-23 – 2019-05-28 (×9): 25 mg via ORAL
  Filled 2019-05-23 (×8): qty 1

## 2019-05-23 MED ORDER — HEPARIN SODIUM (PORCINE) 1000 UNIT/ML IJ SOLN
INTRAMUSCULAR | Status: AC | PRN
  Administered 2019-05-23: 3.2 mL via INTRAVENOUS

## 2019-05-23 MED ORDER — HEPARIN SODIUM (PORCINE) 1000 UNIT/ML IJ SOLN
INTRAMUSCULAR | Status: AC
  Filled 2019-05-23: qty 1

## 2019-05-23 MED ORDER — CEFAZOLIN SODIUM-DEXTROSE 1-4 GM/50ML-% IV SOLN
1.0000 g | Freq: Once | INTRAVENOUS | Status: AC
  Administered 2019-05-23: 1 g via INTRAVENOUS
  Filled 2019-05-23 (×2): qty 50
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
## 2019-05-23 NOTE — Progress Notes (Addendum)
[facility] call center called as directed by the [facility] RN, Transportation set up for patient to be at [place] [facility] at 10:30am on 05/24/19. Hagan Maltz, Bettina Gavia RN
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
## 2019-05-23 NOTE — Progress Notes (Signed)
Spoke with Dr. Norman Clay concerning patient's diet status. Patient insistent on having something to eat. Patient stated that. " he hasn't eaten in 2 days". Patient's current diet order is carb mod. Patient is to go to IR for placement of tunneled HD cath. Time is unknown at this time. Per Dr. Barry Dienes, patient can have something to eat now and then he will be NPO afterwards.

## 2019-05-23 NOTE — Sedation Documentation (Signed)
Patient sent back up on 2L nasal cannula due to patient dropping his O2 saturation post procedure, Kristen RN on the floor made aware.  Patient maintaining at 96% on 2L and alert and oriented.  Patient states he has no pain at this time.
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
## 2019-05-23 NOTE — Progress Notes (Signed)
Spoke with the patients nurse Cyril Mourning, to inform her that we need to get the patient over to the cancer center on 05/24/2019 for a radiation treatment to his left hip.  I gave her carelinks number to call and set up transportation to have the patient here at 10:30 am.  I asked that he have pain medication before coming over.  She verbalized understanding.  Will continue to follow as necessary.  Gloriajean Dell. Leonie Green, BSN [phone]

## 2019-05-23 NOTE — Progress Notes (Signed)
PT Cancellation Note  Patient Details Name: Max Pittman. MRN: [id number] DOB: [birth year]   Cancelled Treatment:    Reason Eval/Treat Not Completed: Patient at procedure or test/unavailable   Shary Decamp [facility] 05/23/2019, 2:02 PM [place] Pager [phone] Office [phone]

## 2019-05-23 NOTE — Procedures (Signed)
Interventional Radiology Procedure Note  Procedure: Tunneled HD catheter placement  Complications: None  Estimated Blood Loss: < 10 mL  Findings: Right IJ 19 cm tip to cuff length Palindrome catheter placed with tip in RA. OK to use.  Venetia Night. Kathlene Cote, M.D Pager:  [phone]

## 2019-05-23 NOTE — Plan of Care (Signed)
  Problem: Nutrition: Goal: Adequate nutrition will be maintained Outcome: Progressing   Problem: Coping: Goal: Level of anxiety will decrease Outcome: Progressing   

## 2019-05-23 NOTE — Progress Notes (Signed)
Xrays completed as ordered.
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
## 2019-05-24 ENCOUNTER — Encounter (HOSPITAL_COMMUNITY): Payer: Self-pay

## 2019-05-24 ENCOUNTER — Ambulatory Visit
Admit: 2019-05-24 | Discharge: 2019-05-24 | Disposition: A | Discharge status: Home / Self Care | Payer: Medicare Other | Attending: Radiation Oncology | Admitting: Radiation Oncology

## 2019-05-24 ENCOUNTER — Other Ambulatory Visit: Payer: Self-pay

## 2019-05-24 ENCOUNTER — Ambulatory Visit: Payer: Medicare Other

## 2019-05-24 ENCOUNTER — Encounter: Payer: Self-pay | Admitting: Radiation Oncology

## 2019-05-24 DIAGNOSIS — S73005A Unspecified dislocation of left hip, initial encounter: Secondary | ICD-10-CM

## 2019-05-24 LAB — COMPREHENSIVE METABOLIC PANEL
ALT: 25 U/L (ref 0–44)
AST: 125 U/L — ABNORMAL HIGH (ref 15–41)
Albumin: 2 g/dL — ABNORMAL LOW (ref 3.5–5.0)
Alkaline Phosphatase: 53 U/L (ref 38–126)
Anion gap: 12 (ref 5–15)
BUN: 37 mg/dL — ABNORMAL HIGH (ref 6–20)
CO2: 25 mmol/L (ref 22–32)
Calcium: 8.2 mg/dL — ABNORMAL LOW (ref 8.9–10.3)
Chloride: 98 mmol/L (ref 98–111)
Creatinine, Ser: 11.15 mg/dL — ABNORMAL HIGH (ref 0.61–1.24)
GFR calc Af Amer: 6 mL/min — ABNORMAL LOW (ref >60)
GFR calc non Af Amer: 5 mL/min — ABNORMAL LOW (ref >60)
Glucose, Bld: 137 mg/dL — ABNORMAL HIGH (ref 70–99)
Potassium: 3.2 mmol/L — ABNORMAL LOW (ref 3.5–5.1)
Sodium: 135 mmol/L (ref 135–145)
Total Bilirubin: 0.7 mg/dL (ref 0.3–1.2)
Total Protein: 4.8 g/dL — ABNORMAL LOW (ref 6.5–8.1)

## 2019-05-24 LAB — CBC
HCT: 25.1 % — ABNORMAL LOW (ref 39.0–52.0)
Hemoglobin: 8.2 g/dL — ABNORMAL LOW (ref 13.0–17.0)
MCH: 30.1 pg (ref 26.0–34.0)
MCHC: 32.7 g/dL (ref 30.0–36.0)
MCV: 92.3 fL (ref 80.0–100.0)
Platelets: 157 10*3/uL (ref 150–400)
RBC: 2.72 MIL/uL — ABNORMAL LOW (ref 4.22–5.81)
RDW: 14.8 % (ref 11.5–15.5)
WBC: 7.6 10*3/uL (ref 4.0–10.5)
nRBC: 0.3 % — ABNORMAL HIGH (ref 0.0–0.2)

## 2019-05-24 LAB — GLUCOSE, CAPILLARY
Glucose-Capillary: 92 mg/dL (ref 70–99)
Glucose-Capillary: 115 mg/dL — ABNORMAL HIGH (ref 70–99)
Glucose-Capillary: 140 mg/dL — ABNORMAL HIGH (ref 70–99)
Glucose-Capillary: 136 mg/dL — ABNORMAL HIGH (ref 70–99)
Glucose-Capillary: 150 mg/dL — ABNORMAL HIGH (ref 70–99)
Glucose-Capillary: 95 mg/dL (ref 70–99)

## 2019-05-24 MED ORDER — DARBEPOETIN ALFA 60 MCG/0.3ML IJ SOSY
60.0000 ug | PREFILLED_SYRINGE | Freq: Once | INTRAMUSCULAR | Status: AC
  Administered 2019-05-24: 18:00:00 60 ug via SUBCUTANEOUS
  Filled 2019-05-24: qty 0.3

## 2019-05-24 MED ORDER — PREGABALIN 50 MG PO CAPS: 50.0000 mg | ORAL_CAPSULE | Freq: Three times a day (TID) | ORAL | Status: DC

## 2019-05-24 MED ORDER — CHLORHEXIDINE GLUCONATE CLOTH 2 % EX PADS
6.0000 | MEDICATED_PAD | Freq: Every day | CUTANEOUS | Status: DC
  Administered 2019-05-25 – 2019-05-26 (×2): 6 via TOPICAL

## 2019-05-24 MED ORDER — PREGABALIN 50 MG PO CAPS
50.0000 mg | ORAL_CAPSULE | Freq: Every day | ORAL | Status: DC
  Administered 2019-05-24 – 2019-06-01 (×8): 50 mg via ORAL
  Filled 2019-05-24 (×8): qty 1

## 2019-05-24 MED ORDER — POTASSIUM CHLORIDE CRYS ER 10 MEQ PO TBCR
10.0000 meq | EXTENDED_RELEASE_TABLET | Freq: Once | ORAL | Status: AC
  Administered 2019-05-24: 10 meq via ORAL
  Filled 2019-05-24: qty 1

## 2019-05-24 MED ORDER — CLOPIDOGREL BISULFATE 75 MG PO TABS
75.0000 mg | ORAL_TABLET | Freq: Every day | ORAL | Status: DC
  Administered 2019-05-24 – 2019-05-26 (×3): 75 mg via ORAL
  Filled 2019-05-24 (×3): qty 1

## 2019-05-24 MED ORDER — ASPIRIN 325 MG PO TABS
325.0000 mg | ORAL_TABLET | Freq: Every day | ORAL | Status: DC
  Administered 2019-05-24 – 2019-05-26 (×3): 325 mg via ORAL
  Filled 2019-05-24 (×3): qty 1
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
## 2019-05-24 NOTE — Anesthesia Preprocedure Evaluation (Addendum)
Anesthesia Evaluation  Patient identified by MRN, date of birth, ID band Patient awake    Reviewed: Allergy & Precautions, H&P , NPO status , Patient's Chart, lab work & pertinent test results, reviewed documented beta blocker date and time   Airway Mallampati: III  TM Distance: >3 FB Neck ROM: Full    Dental no notable dental hx. (+) Teeth Intact, Dental Advisory Given   Pulmonary neg pulmonary ROS, former smoker,    Pulmonary exam normal breath sounds clear to auscultation       Cardiovascular hypertension, Pt. on medications and Pt. on home beta blockers  Rhythm:Regular Rate:Normal     Neuro/Psych negative neurological ROS  negative psych ROS   GI/Hepatic negative GI ROS, Neg liver ROS,   Endo/Other  diabetes, Type 2, Oral Hypoglycemic AgentsMorbid obesity  Renal/GU ESRF and DialysisRenal disease     Musculoskeletal   Abdominal   Peds  Hematology negative hematology ROS (+)   Anesthesia Other Findings   Reproductive/Obstetrics negative OB ROS                             Anesthesia Physical  Anesthesia Plan  ASA: III  Anesthesia Plan: General   Post-op Pain Management:    Induction: Intravenous  PONV Risk Score and Plan: Treatment may vary due to age or medical condition, Ondansetron and Dexamethasone  Airway Management Planned: Oral ETT  Additional Equipment: None  Intra-op Plan:   Post-operative Plan:   Informed Consent: I have reviewed the patients History and Physical, chart, labs and discussed the procedure including the risks, benefits and alternatives for the proposed anesthesia with the patient or authorized representative who has indicated his/her understanding and acceptance.     Dental advisory given  Plan Discussed with: CRNA and Anesthesiologist  Anesthesia Plan Comments:        Anesthesia Quick Evaluation

## 2019-05-24 NOTE — Consult Note (Addendum)
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 These findings are better delineated on the CT scan of the abdomen and pelvis from today. Electronically Signed   By: Dorise Bullion III M.D   On: 05/20/2019 15:47   DG Pelvis Comp Min 3V  Result Date: 05/22/2019 CLINICAL DATA:  Closed fracture EXAM: JUDET PELVIS - 3+ VIEW COMPARISON:  None. FINDINGS: The patient is status post ORIF with plate fixation of the superior left acetabulum. The femoral head is seated within the acetabulum. No new fracture is seen. IMPRESSION: Status post ORIF of the left acetabular fracture. No acute complication. Electronically Signed   By: Prudencio Pair M.D.   On: 05/22/2019 23:56   DG Pelvis 3V Judet  Result Date: 05/22/2019 CLINICAL DATA:  Open reduction internal fixation of the left acetabulum. EXAM: JUDET PELVIS - 3+ VIEW COMPARISON:  None. FINDINGS: Four radiopaque fixation plates, and multiple or radiopaque fixations are seen overlying the superolateral aspect of the left acetabulum. There is no evidence of associated hardware loosening. The known fracture deformity involving the left acetabulum is not clearly identified. There is no evidence of dislocation. Soft tissue structures are unremarkable. IMPRESSION: Status post open reduction and internal fixation of the left acetabulum without evidence of  associated hardware loosening. Electronically Signed   By: Virgina Norfolk M.D.   On: 05/22/2019 21:57   DG Pelvis Comp Min 3V  Result Date: 05/20/2019 CLINICAL DATA:  Left acetabular fracture EXAM: JUDET PELVIS - 3+ VIEW COMPARISON:  05/20/2019 FINDINGS: Frontal and bilateral Judet views of the pelvis are obtained. The comminuted left acetabular fracture seen previously is again identified, with slight interval reduction of the distraction of the fracture fragments. The left femoral head is better seated within the left acetabulum on this exam. Stable right hip osteoarthritis.  No additional fractures. IMPRESSION: 1. Interval reduction of the comminuted left acetabular fracture, with improved alignment of the left hip as a result. Electronically Signed   By: Randa Ngo M.D.   On: 05/20/2019 23:42   DG Pelvis 3V Judet  Result Date: 05/20/2019 CLINICAL DATA:  Postoperative evaluation. EXAM: JUDET PELVIS - 3+ VIEW COMPARISON:  May 20, 2019 (3:03 p.m.) FINDINGS: A comminuted fracture deformity is seen involving the superolateral and inferomedial aspects of the left acetabulum. The associated dislocation seen on the  earlier study has been successfully reduced. Marked severity widening of the left hip joint is seen (approximately 1.6 cm). Soft tissue structures are unremarkable. IMPRESSION: 1. Successful reduction of previously seen left hip dislocation. 2. Comminuted fracture deformity of the left acetabulum. Electronically Signed   By: Virgina Norfolk M.D.   On: 05/20/2019 22:01   IR Fluoro Guide CV Line Right  Result Date: 05/23/2019 CLINICAL DATA:  End-stage renal disease, trauma and need for tunneled hemodialysis catheter. EXAM: TUNNELED CENTRAL VENOUS HEMODIALYSIS CATHETER PLACEMENT WITH ULTRASOUND AND FLUOROSCOPIC GUIDANCE ANESTHESIA/SEDATION: 1.0 mg IV Versed; 50 mcg IV Fentanyl. Total Moderate Sedation Time:   17 minutes. The patient's level of consciousness and physiologic status were  continuously monitored during the procedure by Radiology nursing. MEDICATIONS: 2 g IV Ancef. FLUOROSCOPY TIME:  30 seconds.  4.6 mGy. PROCEDURE: The procedure, risks, benefits, and alternatives were explained to the patient. Questions regarding the procedure were encouraged and answered. The patient understands and consents to the procedure. A timeout was performed prior to initiating the procedure. The right neck and chest were prepped with chlorhexidine in a sterile fashion, and a sterile drape was applied covering the operative field. Maximum barrier sterile technique with sterile gowns and gloves were used for the procedure. Local anesthesia was provided with 1% lidocaine. Ultrasound was utilized to confirm patency of the right internal jugular vein. After creating a small venotomy incision, a 21 gauge needle was advanced into the right internal jugular vein under direct, real-time ultrasound guidance. Ultrasound image documentation was performed. After securing guidewire access, an 8 Fr dilator was placed. A J-wire was kinked to measure appropriate catheter length. A Palindrome tunneled hemodialysis catheter measuring 19 cm from tip to cuff was chosen for placement. This was tunneled in a retrograde fashion from the chest wall to the venotomy incision. At the venotomy, serial dilatation was performed and a 16 Fr peel-away sheath was placed over a guidewire. The catheter was then placed through the sheath and the sheath removed. Final catheter positioning was confirmed and documented with a fluoroscopic spot image. The catheter was aspirated, flushed with saline, and injected with appropriate volume heparin dwells. The venotomy incision was closed with subcuticular 4-0 Vicryl. Dermabond was applied to the incision. The catheter exit site was secured with 0-Prolene retention sutures. COMPLICATIONS: None.  No pneumothorax. FINDINGS: After catheter placement, the tip lies in the right atrium. The catheter aspirates  normally and is ready for immediate use. IMPRESSION: Placement of tunneled hemodialysis catheter via the right internal jugular vein. The catheter tip lies in the right atrium. The catheter is ready for immediate use. Electronically Signed   By: Aletta Edouard M.D.   On: 05/23/2019 16:20   IR US Guide Vasc Access Right  Result Date: 05/23/2019 CLINICAL DATA:  End-stage renal disease, trauma and need for tunneled hemodialysis catheter. EXAM: TUNNELED CENTRAL VENOUS HEMODIALYSIS CATHETER PLACEMENT WITH ULTRASOUND AND FLUOROSCOPIC GUIDANCE ANESTHESIA/SEDATION: 1.0 mg IV Versed; 50 mcg IV Fentanyl. Total Moderate Sedation Time:   17 minutes. The patient's level of consciousness and physiologic status were continuously monitored during the procedure by Radiology nursing. MEDICATIONS: 2 g IV Ancef. FLUOROSCOPY TIME:  30 seconds.  4.6 mGy. PROCEDURE: The procedure, risks, benefits, and alternatives were explained to the patient. Questions regarding the procedure were encouraged and answered. The patient understands and consents to the procedure. A timeout was performed prior to initiating the procedure. The right neck and chest were prepped with chlorhexidine in a sterile fashion, and a sterile drape was  applied covering the operative field. Maximum barrier sterile technique with sterile gowns and gloves were used for the procedure. Local anesthesia was provided with 1% lidocaine. Ultrasound was utilized to confirm patency of the right internal jugular vein. After creating a small venotomy incision, a 21 gauge needle was advanced into the right internal jugular vein under direct, real-time ultrasound guidance. Ultrasound image documentation was performed. After securing guidewire access, an 8 Fr dilator was placed. A J-wire was kinked to measure appropriate catheter length. A Palindrome tunneled hemodialysis catheter measuring 19 cm from tip to cuff was chosen for placement. This was tunneled in a retrograde fashion  from the chest wall to the venotomy incision. At the venotomy, serial dilatation was performed and a 16 Fr peel-away sheath was placed over a guidewire. The catheter was then placed through the sheath and the sheath removed. Final catheter positioning was confirmed and documented with a fluoroscopic spot image. The catheter was aspirated, flushed with saline, and injected with appropriate volume heparin dwells. The venotomy incision was closed with subcuticular 4-0 Vicryl. Dermabond was applied to the incision. The catheter exit site was secured with 0-Prolene retention sutures. COMPLICATIONS: None.  No pneumothorax. FINDINGS: After catheter placement, the tip lies in the right atrium. The catheter aspirates normally and is ready for immediate use. IMPRESSION: Placement of tunneled hemodialysis catheter via the right internal jugular vein. The catheter tip lies in the right atrium. The catheter is ready for immediate use. Electronically Signed   By: Aletta Edouard M.D.   On: 05/23/2019 16:20   DG CHEST PORT 1 VIEW  Result Date: 05/22/2019 CLINICAL DATA:  Multiple rib fractures. EXAM: PORTABLE CHEST 1 VIEW COMPARISON:  05/20/2019 FINDINGS: Mild worsening of paramediastinal atelectasis. No evidence of pneumothorax. No lobar collapse or effusion. Multiple bilateral nondisplaced rib fractures as shown previously. IMPRESSION: Slight worsening of paramediastinal atelectasis.  No pneumothorax. Electronically Signed   By: Nelson Chimes M.D.   On: 05/22/2019 13:06   DG Chest Port 1 View  Result Date: 05/20/2019 CLINICAL DATA:  Pain after trauma EXAM: PORTABLE CHEST 1 VIEW COMPARISON:  March 21, 2019 FINDINGS: Evaluation is limited due to the portable technique. The heart hila are unremarkable. Prominence of the mediastinum is noted to represent prominent fat in the mediastinum on the CT scan of the chest from today. No pneumothorax. No nodules or masses. No pulmonary infiltrates. No acute abnormalities.  IMPRESSION: No acute abnormalities identified. The patient has had a CT of the chest today. Electronically Signed   By: Dorise Bullion III M.D   On: 05/20/2019 15:37   DG Knee Left Port  Result Date: 05/20/2019 CLINICAL DATA:  44 year old male with left lower extremity trauma. EXAM: LEFT FEMUR PORTABLE 1 VIEW; PORTABLE LEFT KNEE - 1-2 VIEW COMPARISON:  None. FINDINGS: Evaluation is limited due to body habitus. There is a displaced appearing fracture of the left pelvic bone with involvement of the left ischium. There is superiorly displaced fracture of the left acetabulum and left hip. The femoral head appears in anatomic alignment with the inferior aspect of the acetabulum but displaced superiorly and superimposed over the left anterior inferior iliac spine. Evaluation of the fracture is very limited due to body habitus. Further evaluation with CT is recommended. No acute fracture or dislocation identified at the knee. No joint effusion. The soft tissues are unremarkable. IMPRESSION: 1. Displaced fractures of the left pelvic bone and left acetabulum. Further evaluation with CT is recommended. 2. No fracture or dislocation of the left  knee. Electronically Signed   By: Anner Crete M.D.   On: 05/20/2019 18:34   DG C-Arm 1-60 Min  Result Date: 05/22/2019 CLINICAL DATA:  Open reduction and internal fixation of the left acetabulum. EXAM: DG C-ARM 1-60 MIN CONTRAST:  None FLUOROSCOPY TIME:  Fluoroscopy Time:  27 seconds Radiation Exposure Index (if provided by the fluoroscopic device): N/A Number of Acquired Spot Images: 8 COMPARISON:  None. FINDINGS: A nondisplaced fracture is seen involving the superior lateral aspect of the left acetabulum. Initially, four radiopaque surgical needles are seen overlying the left acetabulum. Subsequent placement of four radiopaque fixation plates, with multiple radiopaque fixation screws, is seen along the superolateral aspect of the left acetabulum. Soft tissue structures  are unremarkable. IMPRESSION: Status post open reduction and internal fixation of left acetabular fracture. Electronically Signed   By: Virgina Norfolk M.D.   On: 05/22/2019 21:54   ECHOCARDIOGRAM COMPLETE  Result Date: 05/22/2019    ECHOCARDIOGRAM REPORT   Patient Name:   Max Pittman. Date of Exam: 05/22/2019 Medical Rec #:  [id number]            Height:       69.0 in Accession #:    [id number]           Weight:       274.0 lb Date of Birth:  1975/02/05            BSA:          2.362 m Patient Age:    43 years             BP:           138/70 mmHg Patient Gender: M                    HR:           92 bpm. Exam Location:  Inpatient Procedure: 2D Echo, Cardiac Doppler and Color Doppler                                MODIFIED REPORT:  This report was modified by Cherlynn Kaiser MD on 05/22/2019 due to conclusion                                     added.  Indications:     CVA  History:         Patient has no prior history of Echocardiogram examinations.                  Stroke; Risk Factors:Hypertension and Diabetes. ESRD.  Sonographer:     Dustin Flock Referring Phys:  [place] Diagnosing Phys: Cherlynn Kaiser MD  Sonographer Comments: Image acquisition challenging due to uncooperative patient. IMPRESSIONS  1. Left ventricular ejection fraction, by estimation, is 60 to 65%. The left ventricle has normal function. The left ventricle has no regional wall motion abnormalities. There is moderate left ventricular hypertrophy. Indeterminate diastolic filling due  to E-A fusion.  2. Right ventricular systolic function is normal. The right ventricular size is normal. There is normal pulmonary artery systolic pressure. The estimated right ventricular systolic pressure is 24.4 mmHg.  3. The mitral valve is normal in structure. Mild mitral valve regurgitation. No evidence of mitral stenosis.  4. The aortic valve is normal in structure. Aortic valve regurgitation  is not visualized. No aortic stenosis  is present.  5. The inferior vena cava is normal in size with greater than 50% respiratory variability, suggesting right atrial pressure of 3 mmHg. Conclusion(s)/Recommendation(s): No intracardiac source of embolism detected on this transthoracic study. A transesophageal echocardiogram is recommended to exclude cardiac source of embolism if clinically indicated. FINDINGS  Left Ventricle: Left ventricular ejection fraction, by estimation, is 60 to 65%. The left ventricle has normal function. The left ventricle has no regional wall motion abnormalities. The left ventricular internal cavity size was normal in size. There is  moderate left ventricular hypertrophy. Indeterminate diastolic filling due to E-A fusion. Right Ventricle: The right ventricular size is normal. No increase in right ventricular wall thickness. Right ventricular systolic function is normal. There is normal pulmonary artery systolic pressure. The tricuspid regurgitant velocity is 2.57 m/s, and  with an assumed right atrial pressure of 3 mmHg, the estimated right ventricular systolic pressure is 75.1 mmHg. Left Atrium: Left atrial size was normal in size. Right Atrium: Right atrial size was normal in size. Pericardium: There is no evidence of pericardial effusion. Mitral Valve: The mitral valve is normal in structure. Normal mobility of the mitral valve leaflets. Mild mitral valve regurgitation. No evidence of mitral valve stenosis. Tricuspid Valve: The tricuspid valve is normal in structure. Tricuspid valve regurgitation is not demonstrated. No evidence of tricuspid stenosis. Aortic Valve: The aortic valve is normal in structure. Aortic valve regurgitation is not visualized. No aortic stenosis is present. Pulmonic Valve: The pulmonic valve was not well visualized. Pulmonic valve regurgitation is not visualized. No evidence of pulmonic stenosis. Aorta: The aortic root is normal in size and structure. Venous: The inferior vena cava is normal in size  with greater than 50% respiratory variability, suggesting right atrial pressure of 3 mmHg. IAS/Shunts: No atrial level shunt detected by color flow Doppler.  LEFT VENTRICLE PLAX 2D LVIDd:         4.30 cm  Diastology LVIDs:         3.00 cm  LV e' lateral:   9.68 cm/s LV PW:         1.40 cm  LV E/e' lateral: 8.7 LV IVS:        1.50 cm  LV e' medial:    7.29 cm/s LVOT diam:     2.30 cm  LV E/e' medial:  11.6 LV SV:         71 LV SV Index:   30 LVOT Area:     4.15 cm  RIGHT VENTRICLE RV Basal diam:  2.60 cm RV S prime:     12.10 cm/s TAPSE (M-mode): 2.8 cm LEFT ATRIUM           Index       RIGHT ATRIUM           Index LA diam:      3.50 cm 1.48 cm/m  RA Area:     15.00 cm LA Vol (A2C): 37.7 ml 15.96 ml/m RA Volume:   35.30 ml  14.95 ml/m LA Vol (A4C): 55.6 ml 23.54 ml/m  AORTIC VALVE LVOT Vmax:   96.80 cm/s LVOT Vmean:  63.000 cm/s LVOT VTI:    0.171 m  AORTA Ao Root diam: 3.00 cm MITRAL VALVE                TRICUSPID VALVE MV Area (PHT): 7.59 cm     TR Peak grad:   26.4 mmHg MV Decel Time: 100 msec  TR Vmax:        257.00 cm/s MV E velocity: 84.40 cm/s MV A velocity: 111.00 cm/s  SHUNTS MV E/A ratio:  0.76         Systemic VTI:  0.17 m                             Systemic Diam: 2.30 cm Cherlynn Kaiser MD Electronically signed by Cherlynn Kaiser MD Signature Date/Time: 05/22/2019/3:59:14 PM    Final (Updated)    DG Femur Portable 1 View Left  Result Date: 05/20/2019 CLINICAL DATA:  44 year old male with left lower extremity trauma. EXAM: LEFT FEMUR PORTABLE 1 VIEW; PORTABLE LEFT KNEE - 1-2 VIEW COMPARISON:  None. FINDINGS: Evaluation is limited due to body habitus. There is a displaced appearing fracture of the left pelvic bone with involvement of the left ischium. There is superiorly displaced fracture of the left acetabulum and left hip. The femoral head appears in anatomic alignment with the inferior aspect of the acetabulum but displaced superiorly and superimposed over the left anterior inferior iliac  spine. Evaluation of the fracture is very limited due to body habitus. Further evaluation with CT is recommended. No acute fracture or dislocation identified at the knee. No joint effusion. The soft tissues are unremarkable. IMPRESSION: 1. Displaced fractures of the left pelvic bone and left acetabulum. Further evaluation with CT is recommended. 2. No fracture or dislocation of the left knee. Electronically Signed   By: Anner Crete M.D.   On: 05/20/2019 18:34   VAS US CAROTID  Result Date: 05/22/2019 Carotid Arterial Duplex Study Indications:       Hypoattenuation Left frontoparietal area. Risk Factors:      Hypertension, Diabetes, prior CVA. Other Factors:     ESRD on peritoneal dialysis. Limitations        Today's exam was limited due to the body habitus of the                    patient. Comparison Study:  No prior study on file Performing Technologist: Sharion Dove RVS  Examination Guidelines: A complete evaluation includes B-mode imaging, spectral Doppler, color Doppler, and power Doppler as needed of all accessible portions of each vessel. Bilateral testing is considered an integral part of a complete examination. Limited examinations for reoccurring indications may be performed as noted.  Right Carotid Findings: +----------+--------+--------+--------+------------------+------------------+           PSV cm/sEDV cm/sStenosisPlaque DescriptionComments           +----------+--------+--------+--------+------------------+------------------+ CCA Prox  139     35                                intimal thickening +----------+--------+--------+--------+------------------+------------------+ CCA Distal77      17                                intimal thickening +----------+--------+--------+--------+------------------+------------------+ ICA Prox  71      25                                                    +----------+--------+--------+--------+------------------+------------------+ ICA Distal76      30                                                   +----------+--------+--------+--------+------------------+------------------+  ECA       83      13                                                   +----------+--------+--------+--------+------------------+------------------+ +----------+--------+-------+--------+-------------------+           PSV cm/sEDV cmsDescribeArm Pressure (mmHG) +----------+--------+-------+--------+-------------------+ Subclavian201                                        +----------+--------+-------+--------+-------------------+ +---------+--------+--+--------+-+ VertebralPSV cm/s29EDV cm/s9 +---------+--------+--+--------+-+  Left Carotid Findings: +----------+--------+--------+--------+------------------+------------------+           PSV cm/sEDV cm/sStenosisPlaque DescriptionComments           +----------+--------+--------+--------+------------------+------------------+ CCA Prox  99      18                                intimal thickening +----------+--------+--------+--------+------------------+------------------+ CCA Distal126     31                                intimal thickening +----------+--------+--------+--------+------------------+------------------+ ICA Prox  94      28                                                   +----------+--------+--------+--------+------------------+------------------+ ICA Distal127     34                                                   +----------+--------+--------+--------+------------------+------------------+ ECA       101     20                                                   +----------+--------+--------+--------+------------------+------------------+ +----------+--------+--------+--------+-------------------+           PSV cm/sEDV cm/sDescribeArm Pressure (mmHG)  +----------+--------+--------+--------+-------------------+ [id number]                                         +----------+--------+--------+--------+-------------------+ +---------+--------+--+--------+--+ VertebralPSV cm/s61EDV cm/s18 +---------+--------+--+--------+--+   Summary: Right Carotid: The extracranial vessels were near-normal with only minimal wall                thickening or plaque. Left Carotid: The extracranial vessels were near-normal with only minimal wall               thickening or plaque. Vertebrals:  Bilateral vertebral arteries demonstrate antegrade flow. Subclavians: Normal flow hemodynamics were seen in bilateral subclavian              arteries. *See table(s) above for measurements and observations.  Electronically signed by Harold Barban MD on 05/22/2019 at 8:55:53  AM.    Final        IMPRESSION:  The patient has been diagnosed with a acetabular fracture of the left hip. 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  We have discussed the rationale of this treatment with the patient. I have discussed the possible/expected benefit of such a treatment. I have also discussed the possible side effects and risks of treatment as well. All of the patient's questions have been answered. Written consent is obtained and placed in the chart, a copy was provided to the patient.    PLAN: The patient will undergo simulation and one fraction of external beam radiation treatment. This will be completed to a dose of 7 Gy. This treatment will be completed on postoperative day #2.   In a visit lasting 30 minutes, greater than 50% of the time was spent face to face and in floor time discussing the patient's condition, in preparation for the discussion, and coordinating the patient's care.       Carola Rhine, [facility]   **Disclaimer: This note was dictated with voice  recognition software. Similar sounding words can inadvertently be transcribed and this note may contain transcription errors which may not have been corrected upon publication of note.**

## 2019-05-24 NOTE — H&P (View-Only) (Signed)
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 findings are better delineated on the CT scan of the abdomen and pelvis from today. Electronically Signed   By: Dorise Bullion III M.D   On: 05/20/2019 15:47   DG Pelvis Comp Min 3V  Result Date: 05/22/2019 CLINICAL DATA:  Closed fracture EXAM: JUDET PELVIS - 3+ VIEW COMPARISON:  None. FINDINGS: The patient is status post ORIF with plate fixation of the superior left acetabulum. The femoral head is seated within the acetabulum. No new fracture is seen. IMPRESSION: Status post ORIF of the left acetabular fracture. No acute complication. Electronically Signed   By: Prudencio Pair M.D.   On: 05/22/2019 23:56   DG Pelvis 3V Judet  Result Date: 05/22/2019 CLINICAL DATA:  Open reduction internal fixation of the left acetabulum. EXAM: JUDET PELVIS - 3+ VIEW COMPARISON:  None. FINDINGS: Four radiopaque fixation plates, and multiple or radiopaque fixations are seen overlying the superolateral aspect of the left acetabulum. There is no evidence of associated hardware loosening. The known fracture deformity involving the left acetabulum is not clearly identified. There is no evidence of dislocation. Soft tissue structures are unremarkable. IMPRESSION: Status post open reduction and internal fixation of the left acetabulum without evidence of  associated hardware loosening. Electronically Signed   By: Virgina Norfolk M.D.   On: 05/22/2019 21:57   DG Pelvis Comp Min 3V  Result Date: 05/20/2019 CLINICAL DATA:  Left acetabular fracture EXAM: JUDET PELVIS - 3+ VIEW COMPARISON:  05/20/2019 FINDINGS: Frontal and bilateral Judet views of the pelvis are obtained. The comminuted left acetabular fracture seen previously is again identified, with slight interval reduction of the distraction of the fracture fragments. The left femoral head is better seated within the left acetabulum on this exam. Stable right hip osteoarthritis.  No additional fractures. IMPRESSION: 1. Interval reduction of the comminuted left acetabular fracture, with improved alignment of the left hip as a result. Electronically Signed   By: Randa Ngo M.D.   On: 05/20/2019 23:42   DG Pelvis 3V Judet  Result Date: 05/20/2019 CLINICAL DATA:  Postoperative evaluation. EXAM: JUDET PELVIS - 3+ VIEW COMPARISON:  May 20, 2019 (3:03 p.m.) FINDINGS: A comminuted fracture deformity is seen involving the superolateral and inferomedial aspects of the left acetabulum. The associated dislocation seen on the  earlier study has been successfully reduced. Marked severity widening of the left hip joint is seen (approximately 1.6 cm). Soft tissue structures are unremarkable. IMPRESSION: 1. Successful reduction of previously seen left hip dislocation. 2. Comminuted fracture deformity of the left acetabulum. Electronically Signed   By: Virgina Norfolk M.D.   On: 05/20/2019 22:01   IR Fluoro Guide CV Line Right  Result Date: 05/23/2019 CLINICAL DATA:  End-stage renal disease, trauma and need for tunneled hemodialysis catheter. EXAM: TUNNELED CENTRAL VENOUS HEMODIALYSIS CATHETER PLACEMENT WITH ULTRASOUND AND FLUOROSCOPIC GUIDANCE ANESTHESIA/SEDATION: 1.0 mg IV Versed; 50 mcg IV Fentanyl. Total Moderate Sedation Time:   17 minutes. The patient's level of consciousness and physiologic status were  continuously monitored during the procedure by Radiology nursing. MEDICATIONS: 2 g IV Ancef. FLUOROSCOPY TIME:  30 seconds.  4.6 mGy. PROCEDURE: The procedure, risks, benefits, and alternatives were explained to the patient. Questions regarding the procedure were encouraged and answered. The patient understands and consents to the procedure. A timeout was performed prior to initiating the procedure. The right neck and chest were prepped with chlorhexidine in a sterile fashion, and a sterile drape was applied covering the operative field. Maximum barrier sterile technique with sterile gowns and gloves were used for the procedure. Local anesthesia was provided with 1% lidocaine. Ultrasound was utilized to confirm patency of the right internal jugular vein. After creating a small venotomy incision, a 21 gauge needle was advanced into the right internal jugular vein under direct, real-time ultrasound guidance. Ultrasound image documentation was performed. After securing guidewire access, an 8 Fr dilator was placed. A J-wire was kinked to measure appropriate catheter length. A Palindrome tunneled hemodialysis catheter measuring 19 cm from tip to cuff was chosen for placement. This was tunneled in a retrograde fashion from the chest wall to the venotomy incision. At the venotomy, serial dilatation was performed and a 16 Fr peel-away sheath was placed over a guidewire. The catheter was then placed through the sheath and the sheath removed. Final catheter positioning was confirmed and documented with a fluoroscopic spot image. The catheter was aspirated, flushed with saline, and injected with appropriate volume heparin dwells. The venotomy incision was closed with subcuticular 4-0 Vicryl. Dermabond was applied to the incision. The catheter exit site was secured with 0-Prolene retention sutures. COMPLICATIONS: None.  No pneumothorax. FINDINGS: After catheter placement, the tip lies in the right atrium. The catheter aspirates  normally and is ready for immediate use. IMPRESSION: Placement of tunneled hemodialysis catheter via the right internal jugular vein. The catheter tip lies in the right atrium. The catheter is ready for immediate use. Electronically Signed   By: Aletta Edouard M.D.   On: 05/23/2019 16:20   IR US Guide Vasc Access Right  Result Date: 05/23/2019 CLINICAL DATA:  End-stage renal disease, trauma and need for tunneled hemodialysis catheter. EXAM: TUNNELED CENTRAL VENOUS HEMODIALYSIS CATHETER PLACEMENT WITH ULTRASOUND AND FLUOROSCOPIC GUIDANCE ANESTHESIA/SEDATION: 1.0 mg IV Versed; 50 mcg IV Fentanyl. Total Moderate Sedation Time:   17 minutes. The patient's level of consciousness and physiologic status were continuously monitored during the procedure by Radiology nursing. MEDICATIONS: 2 g IV Ancef. FLUOROSCOPY TIME:  30 seconds.  4.6 mGy. PROCEDURE: The procedure, risks, benefits, and alternatives were explained to the patient. Questions regarding the procedure were encouraged and answered. The patient understands and consents to the procedure. A timeout was performed prior to initiating the procedure. The right neck and chest were prepped with chlorhexidine in a sterile fashion, and a sterile drape was  applied covering the operative field. Maximum barrier sterile technique with sterile gowns and gloves were used for the procedure. Local anesthesia was provided with 1% lidocaine. Ultrasound was utilized to confirm patency of the right internal jugular vein. After creating a small venotomy incision, a 21 gauge needle was advanced into the right internal jugular vein under direct, real-time ultrasound guidance. Ultrasound image documentation was performed. After securing guidewire access, an 8 Fr dilator was placed. A J-wire was kinked to measure appropriate catheter length. A Palindrome tunneled hemodialysis catheter measuring 19 cm from tip to cuff was chosen for placement. This was tunneled in a retrograde fashion  from the chest wall to the venotomy incision. At the venotomy, serial dilatation was performed and a 16 Fr peel-away sheath was placed over a guidewire. The catheter was then placed through the sheath and the sheath removed. Final catheter positioning was confirmed and documented with a fluoroscopic spot image. The catheter was aspirated, flushed with saline, and injected with appropriate volume heparin dwells. The venotomy incision was closed with subcuticular 4-0 Vicryl. Dermabond was applied to the incision. The catheter exit site was secured with 0-Prolene retention sutures. COMPLICATIONS: None.  No pneumothorax. FINDINGS: After catheter placement, the tip lies in the right atrium. The catheter aspirates normally and is ready for immediate use. IMPRESSION: Placement of tunneled hemodialysis catheter via the right internal jugular vein. The catheter tip lies in the right atrium. The catheter is ready for immediate use. Electronically Signed   By: Aletta Edouard M.D.   On: 05/23/2019 16:20   DG CHEST PORT 1 VIEW  Result Date: 05/22/2019 CLINICAL DATA:  Multiple rib fractures. EXAM: PORTABLE CHEST 1 VIEW COMPARISON:  05/20/2019 FINDINGS: Mild worsening of paramediastinal atelectasis. No evidence of pneumothorax. No lobar collapse or effusion. Multiple bilateral nondisplaced rib fractures as shown previously. IMPRESSION: Slight worsening of paramediastinal atelectasis.  No pneumothorax. Electronically Signed   By: Nelson Chimes M.D.   On: 05/22/2019 13:06   DG Chest Port 1 View  Result Date: 05/20/2019 CLINICAL DATA:  Pain after trauma EXAM: PORTABLE CHEST 1 VIEW COMPARISON:  March 21, 2019 FINDINGS: Evaluation is limited due to the portable technique. The heart hila are unremarkable. Prominence of the mediastinum is noted to represent prominent fat in the mediastinum on the CT scan of the chest from today. No pneumothorax. No nodules or masses. No pulmonary infiltrates. No acute abnormalities.  IMPRESSION: No acute abnormalities identified. The patient has had a CT of the chest today. Electronically Signed   By: Dorise Bullion III M.D   On: 05/20/2019 15:37   DG Knee Left Port  Result Date: 05/20/2019 CLINICAL DATA:  44 year old male with left lower extremity trauma. EXAM: LEFT FEMUR PORTABLE 1 VIEW; PORTABLE LEFT KNEE - 1-2 VIEW COMPARISON:  None. FINDINGS: Evaluation is limited due to body habitus. There is a displaced appearing fracture of the left pelvic bone with involvement of the left ischium. There is superiorly displaced fracture of the left acetabulum and left hip. The femoral head appears in anatomic alignment with the inferior aspect of the acetabulum but displaced superiorly and superimposed over the left anterior inferior iliac spine. Evaluation of the fracture is very limited due to body habitus. Further evaluation with CT is recommended. No acute fracture or dislocation identified at the knee. No joint effusion. The soft tissues are unremarkable. IMPRESSION: 1. Displaced fractures of the left pelvic bone and left acetabulum. Further evaluation with CT is recommended. 2. No fracture or dislocation of the left  knee. Electronically Signed   By: Anner Crete M.D.   On: 05/20/2019 18:34   DG C-Arm 1-60 Min  Result Date: 05/22/2019 CLINICAL DATA:  Open reduction and internal fixation of the left acetabulum. EXAM: DG C-ARM 1-60 MIN CONTRAST:  None FLUOROSCOPY TIME:  Fluoroscopy Time:  27 seconds Radiation Exposure Index (if provided by the fluoroscopic device): N/A Number of Acquired Spot Images: 8 COMPARISON:  None. FINDINGS: A nondisplaced fracture is seen involving the superior lateral aspect of the left acetabulum. Initially, four radiopaque surgical needles are seen overlying the left acetabulum. Subsequent placement of four radiopaque fixation plates, with multiple radiopaque fixation screws, is seen along the superolateral aspect of the left acetabulum. Soft tissue structures  are unremarkable. IMPRESSION: Status post open reduction and internal fixation of left acetabular fracture. Electronically Signed   By: Virgina Norfolk M.D.   On: 05/22/2019 21:54   ECHOCARDIOGRAM COMPLETE  Result Date: 05/22/2019    ECHOCARDIOGRAM REPORT   Patient Name:   Max Pittman. Date of Exam: 05/22/2019 Medical Rec #:  [id number]            Height:       69.0 in Accession #:    [id number]           Weight:       274.0 lb Date of Birth:  24-Jun-1975            BSA:          2.362 m Patient Age:    43 years             BP:           138/70 mmHg Patient Gender: M                    HR:           92 bpm. Exam Location:  Inpatient Procedure: 2D Echo, Cardiac Doppler and Color Doppler                                MODIFIED REPORT:  This report was modified by Cherlynn Kaiser MD on 05/22/2019 due to conclusion                                     added.  Indications:     CVA  History:         Patient has no prior history of Echocardiogram examinations.                  Stroke; Risk Factors:Hypertension and Diabetes. ESRD.  Sonographer:     Dustin Flock Referring Phys:  [place] Diagnosing Phys: Cherlynn Kaiser MD  Sonographer Comments: Image acquisition challenging due to uncooperative patient. IMPRESSIONS  1. Left ventricular ejection fraction, by estimation, is 60 to 65%. The left ventricle has normal function. The left ventricle has no regional wall motion abnormalities. There is moderate left ventricular hypertrophy. Indeterminate diastolic filling due  to E-A fusion.  2. Right ventricular systolic function is normal. The right ventricular size is normal. There is normal pulmonary artery systolic pressure. The estimated right ventricular systolic pressure is 45.8 mmHg.  3. The mitral valve is normal in structure. Mild mitral valve regurgitation. No evidence of mitral stenosis.  4. The aortic valve is normal in structure. Aortic valve regurgitation  is not visualized. No aortic stenosis  is present.  5. The inferior vena cava is normal in size with greater than 50% respiratory variability, suggesting right atrial pressure of 3 mmHg. Conclusion(s)/Recommendation(s): No intracardiac source of embolism detected on this transthoracic study. A transesophageal echocardiogram is recommended to exclude cardiac source of embolism if clinically indicated. FINDINGS  Left Ventricle: Left ventricular ejection fraction, by estimation, is 60 to 65%. The left ventricle has normal function. The left ventricle has no regional wall motion abnormalities. The left ventricular internal cavity size was normal in size. There is  moderate left ventricular hypertrophy. Indeterminate diastolic filling due to E-A fusion. Right Ventricle: The right ventricular size is normal. No increase in right ventricular wall thickness. Right ventricular systolic function is normal. There is normal pulmonary artery systolic pressure. The tricuspid regurgitant velocity is 2.57 m/s, and  with an assumed right atrial pressure of 3 mmHg, the estimated right ventricular systolic pressure is 02.5 mmHg. Left Atrium: Left atrial size was normal in size. Right Atrium: Right atrial size was normal in size. Pericardium: There is no evidence of pericardial effusion. Mitral Valve: The mitral valve is normal in structure. Normal mobility of the mitral valve leaflets. Mild mitral valve regurgitation. No evidence of mitral valve stenosis. Tricuspid Valve: The tricuspid valve is normal in structure. Tricuspid valve regurgitation is not demonstrated. No evidence of tricuspid stenosis. Aortic Valve: The aortic valve is normal in structure. Aortic valve regurgitation is not visualized. No aortic stenosis is present. Pulmonic Valve: The pulmonic valve was not well visualized. Pulmonic valve regurgitation is not visualized. No evidence of pulmonic stenosis. Aorta: The aortic root is normal in size and structure. Venous: The inferior vena cava is normal in size  with greater than 50% respiratory variability, suggesting right atrial pressure of 3 mmHg. IAS/Shunts: No atrial level shunt detected by color flow Doppler.  LEFT VENTRICLE PLAX 2D LVIDd:         4.30 cm  Diastology LVIDs:         3.00 cm  LV e' lateral:   9.68 cm/s LV PW:         1.40 cm  LV E/e' lateral: 8.7 LV IVS:        1.50 cm  LV e' medial:    7.29 cm/s LVOT diam:     2.30 cm  LV E/e' medial:  11.6 LV SV:         71 LV SV Index:   30 LVOT Area:     4.15 cm  RIGHT VENTRICLE RV Basal diam:  2.60 cm RV S prime:     12.10 cm/s TAPSE (M-mode): 2.8 cm LEFT ATRIUM           Index       RIGHT ATRIUM           Index LA diam:      3.50 cm 1.48 cm/m  RA Area:     15.00 cm LA Vol (A2C): 37.7 ml 15.96 ml/m RA Volume:   35.30 ml  14.95 ml/m LA Vol (A4C): 55.6 ml 23.54 ml/m  AORTIC VALVE LVOT Vmax:   96.80 cm/s LVOT Vmean:  63.000 cm/s LVOT VTI:    0.171 m  AORTA Ao Root diam: 3.00 cm MITRAL VALVE                TRICUSPID VALVE MV Area (PHT): 7.59 cm     TR Peak grad:   26.4 mmHg MV Decel Time: 100 msec  TR Vmax:        257.00 cm/s MV E velocity: 84.40 cm/s MV A velocity: 111.00 cm/s  SHUNTS MV E/A ratio:  0.76         Systemic VTI:  0.17 m                             Systemic Diam: 2.30 cm Cherlynn Kaiser MD Electronically signed by Cherlynn Kaiser MD Signature Date/Time: 05/22/2019/3:59:14 PM    Final (Updated)    DG Femur Portable 1 View Left  Result Date: 05/20/2019 CLINICAL DATA:  44 year old male with left lower extremity trauma. EXAM: LEFT FEMUR PORTABLE 1 VIEW; PORTABLE LEFT KNEE - 1-2 VIEW COMPARISON:  None. FINDINGS: Evaluation is limited due to body habitus. There is a displaced appearing fracture of the left pelvic bone with involvement of the left ischium. There is superiorly displaced fracture of the left acetabulum and left hip. The femoral head appears in anatomic alignment with the inferior aspect of the acetabulum but displaced superiorly and superimposed over the left anterior inferior iliac  spine. Evaluation of the fracture is very limited due to body habitus. Further evaluation with CT is recommended. No acute fracture or dislocation identified at the knee. No joint effusion. The soft tissues are unremarkable. IMPRESSION: 1. Displaced fractures of the left pelvic bone and left acetabulum. Further evaluation with CT is recommended. 2. No fracture or dislocation of the left knee. Electronically Signed   By: Anner Crete M.D.   On: 05/20/2019 18:34   VAS US CAROTID  Result Date: 05/22/2019 Carotid Arterial Duplex Study Indications:       Hypoattenuation Left frontoparietal area. Risk Factors:      Hypertension, Diabetes, prior CVA. Other Factors:     ESRD on peritoneal dialysis. Limitations        Today's exam was limited due to the body habitus of the                    patient. Comparison Study:  No prior study on file Performing Technologist: Sharion Dove RVS  Examination Guidelines: A complete evaluation includes B-mode imaging, spectral Doppler, color Doppler, and power Doppler as needed of all accessible portions of each vessel. Bilateral testing is considered an integral part of a complete examination. Limited examinations for reoccurring indications may be performed as noted.  Right Carotid Findings: +----------+--------+--------+--------+------------------+------------------+           PSV cm/sEDV cm/sStenosisPlaque DescriptionComments           +----------+--------+--------+--------+------------------+------------------+ CCA Prox  139     35                                intimal thickening +----------+--------+--------+--------+------------------+------------------+ CCA Distal77      17                                intimal thickening +----------+--------+--------+--------+------------------+------------------+ ICA Prox  71      25                                                    +----------+--------+--------+--------+------------------+------------------+ ICA Distal76      30                                                   +----------+--------+--------+--------+------------------+------------------+  ECA       83      13                                                   +----------+--------+--------+--------+------------------+------------------+ +----------+--------+-------+--------+-------------------+           PSV cm/sEDV cmsDescribeArm Pressure (mmHG) +----------+--------+-------+--------+-------------------+ Subclavian201                                        +----------+--------+-------+--------+-------------------+ +---------+--------+--+--------+-+ VertebralPSV cm/s29EDV cm/s9 +---------+--------+--+--------+-+  Left Carotid Findings: +----------+--------+--------+--------+------------------+------------------+           PSV cm/sEDV cm/sStenosisPlaque DescriptionComments           +----------+--------+--------+--------+------------------+------------------+ CCA Prox  99      18                                intimal thickening +----------+--------+--------+--------+------------------+------------------+ CCA Distal126     31                                intimal thickening +----------+--------+--------+--------+------------------+------------------+ ICA Prox  94      28                                                   +----------+--------+--------+--------+------------------+------------------+ ICA Distal127     34                                                   +----------+--------+--------+--------+------------------+------------------+ ECA       101     20                                                   +----------+--------+--------+--------+------------------+------------------+ +----------+--------+--------+--------+-------------------+           PSV cm/sEDV cm/sDescribeArm Pressure (mmHG)  +----------+--------+--------+--------+-------------------+ [id number]                                         +----------+--------+--------+--------+-------------------+ +---------+--------+--+--------+--+ VertebralPSV cm/s61EDV cm/s18 +---------+--------+--+--------+--+   Summary: Right Carotid: The extracranial vessels were near-normal with only minimal wall                thickening or plaque. Left Carotid: The extracranial vessels were near-normal with only minimal wall               thickening or plaque. Vertebrals:  Bilateral vertebral arteries demonstrate antegrade flow. Subclavians: Normal flow hemodynamics were seen in bilateral subclavian              arteries. *See table(s) above for measurements and observations.  Electronically signed by Harold Barban MD on 05/22/2019 at 8:55:53  AM.    Final        IMPRESSION:  The patient has been diagnosed with a acetabular fracture of the left hip. 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  We have discussed the rationale of this treatment with the patient. I have discussed the possible/expected benefit of such a treatment. I have also discussed the possible side effects and risks of treatment as well. All of the patient's questions have been answered. Written consent is obtained and placed in the chart, a copy was provided to the patient.    PLAN: The patient will undergo simulation and one fraction of external beam radiation treatment. This will be completed to a dose of 7 Gy. This treatment will be completed on postoperative day #2.   In a visit lasting 30 minutes, greater than 50% of the time was spent face to face and in floor time discussing the patient's condition, in preparation for the discussion, and coordinating the patient's care.       Carola Rhine, [facility]   **Disclaimer: This note was dictated with voice  recognition software. Similar sounding words can inadvertently be transcribed and this note may contain transcription errors which may not have been corrected upon publication of note.**
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
## 2019-05-24 NOTE — Plan of Care (Signed)
  Problem: Clinical Measurements: Goal: Ability to maintain clinical measurements within normal limits will improve Outcome: Not Progressing   Problem: Activity: Goal: Risk for activity intolerance will decrease Outcome: Not Progressing   Problem: Pain Managment: Goal: General experience of comfort will improve Outcome: Not Progressing   Problem: Safety: Goal: Ability to remain free from injury will improve Outcome: Progressing   Problem: Skin Integrity: Goal: Risk for impaired skin integrity will decrease Outcome: Not Progressing
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
## 2019-05-24 NOTE — Progress Notes (Signed)
OT Cancellation Note  Patient Details Name: Max Pittman. MRN: [id number] DOB: [birth year]   Cancelled Treatment:    Reason Eval/Treat Not Completed: Patient at procedure or test/ unavailable(Pt being taken to radiation at this time. OT to follow later)   Max Pittman, OTR/L Acute Rehabilitation Services Pager: [phone] Office: [phone]   Max Pittman C 05/24/2019, 9:54 AM
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
## 2019-05-24 NOTE — Social Work (Signed)
CSW met with pt at bedside. CSW introduced self and explained her role. CSW completed sbirt with pt.  Pt scored a 0 on the sbirt scale. Pt denied alcohol use. Pt denied substance use. Pt did not need resources at this time.  Lillieanna Tuohy, LCSWA, LCASA Clinical Social Worker [phone]   
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
## 2019-05-25 ENCOUNTER — Encounter (HOSPITAL_COMMUNITY): Admission: EM | Disposition: A | Discharge status: Rehabilitation Facility | Payer: Self-pay | Source: Home / Self Care

## 2019-05-25 ENCOUNTER — Inpatient Hospital Stay (HOSPITAL_COMMUNITY): Payer: Medicare Other | Admitting: Certified Registered"

## 2019-05-25 ENCOUNTER — Encounter (HOSPITAL_COMMUNITY): Payer: Self-pay

## 2019-05-25 ENCOUNTER — Inpatient Hospital Stay (HOSPITAL_COMMUNITY): Payer: Medicare Other

## 2019-05-25 DIAGNOSIS — I6389 Other cerebral infarction: Secondary | ICD-10-CM | POA: Diagnosis not present

## 2019-05-25 DIAGNOSIS — I63412 Cerebral infarction due to embolism of left middle cerebral artery: Secondary | ICD-10-CM | POA: Diagnosis not present

## 2019-05-25 DIAGNOSIS — S32810A Multiple fractures of pelvis with stable disruption of pelvic ring, initial encounter for closed fracture: Secondary | ICD-10-CM | POA: Diagnosis not present

## 2019-05-25 DIAGNOSIS — I340 Nonrheumatic mitral (valve) insufficiency: Secondary | ICD-10-CM | POA: Diagnosis not present

## 2019-05-25 HISTORY — PX: LOOP RECORDER INSERTION: EP1214

## 2019-05-25 HISTORY — PX: TEE WITHOUT CARDIOVERSION: SHX5443

## 2019-05-25 HISTORY — PX: BUBBLE STUDY: SHX6837

## 2019-05-25 LAB — RENAL FUNCTION PANEL
Albumin: 1.9 g/dL — ABNORMAL LOW (ref 3.5–5.0)
Anion gap: 15 (ref 5–15)
BUN: 56 mg/dL — ABNORMAL HIGH (ref 6–20)
CO2: 22 mmol/L (ref 22–32)
Calcium: 8.4 mg/dL — ABNORMAL LOW (ref 8.9–10.3)
Chloride: 95 mmol/L — ABNORMAL LOW (ref 98–111)
Creatinine, Ser: 12.94 mg/dL — ABNORMAL HIGH (ref 0.61–1.24)
GFR calc Af Amer: 5 mL/min — ABNORMAL LOW (ref >60)
GFR calc non Af Amer: 4 mL/min — ABNORMAL LOW (ref >60)
Glucose, Bld: 133 mg/dL — ABNORMAL HIGH (ref 70–99)
Phosphorus: 6.3 mg/dL — ABNORMAL HIGH (ref 2.5–4.6)
Potassium: 3.3 mmol/L — ABNORMAL LOW (ref 3.5–5.1)
Sodium: 132 mmol/L — ABNORMAL LOW (ref 135–145)

## 2019-05-25 LAB — CBC
HCT: 25.2 % — ABNORMAL LOW (ref 39.0–52.0)
Hemoglobin: 8.2 g/dL — ABNORMAL LOW (ref 13.0–17.0)
MCH: 29.8 pg (ref 26.0–34.0)
MCHC: 32.5 g/dL (ref 30.0–36.0)
MCV: 91.6 fL (ref 80.0–100.0)
Platelets: 188 10*3/uL (ref 150–400)
RBC: 2.75 MIL/uL — ABNORMAL LOW (ref 4.22–5.81)
RDW: 15.3 % (ref 11.5–15.5)
WBC: 10 10*3/uL (ref 4.0–10.5)
nRBC: 0.4 % — ABNORMAL HIGH (ref 0.0–0.2)

## 2019-05-25 LAB — GLUCOSE, CAPILLARY
Glucose-Capillary: 99 mg/dL (ref 70–99)
Glucose-Capillary: 163 mg/dL — ABNORMAL HIGH (ref 70–99)
Glucose-Capillary: 146 mg/dL — ABNORMAL HIGH (ref 70–99)
Glucose-Capillary: 165 mg/dL — ABNORMAL HIGH (ref 70–99)

## 2019-05-25 SURGERY — LOOP RECORDER INSERTION

## 2019-05-25 SURGERY — ECHOCARDIOGRAM, TRANSESOPHAGEAL
Anesthesia: Monitor Anesthesia Care

## 2019-05-25 MED ORDER — LIDOCAINE-EPINEPHRINE 1 %-1:100000 IJ SOLN
INTRAMUSCULAR | Status: DC | PRN
  Administered 2019-05-25: 8 mL

## 2019-05-25 MED ORDER — CALCIUM CARBONATE ANTACID 500 MG PO CHEW: 1.0000 | CHEWABLE_TABLET | Freq: Once | ORAL | Status: DC

## 2019-05-25 MED ORDER — BISACODYL 10 MG RE SUPP: 10.0000 mg | Freq: Once | RECTAL | Status: DC

## 2019-05-25 MED ORDER — DEXAMETHASONE SODIUM PHOSPHATE 10 MG/ML IJ SOLN
INTRAMUSCULAR | Status: DC | PRN
  Administered 2019-05-25: 4 mg via INTRAVENOUS

## 2019-05-25 MED ORDER — FENTANYL CITRATE (PF) 100 MCG/2ML IJ SOLN
INTRAMUSCULAR | Status: DC | PRN
  Administered 2019-05-25: 100 ug via INTRAVENOUS

## 2019-05-25 MED ORDER — PROPOFOL 10 MG/ML IV BOLUS
INTRAVENOUS | Status: DC | PRN
  Administered 2019-05-25: 160 mg via INTRAVENOUS
  Administered 2019-05-25: 20 mg via INTRAVENOUS

## 2019-05-25 MED ORDER — HEPARIN SODIUM (PORCINE) 1000 UNIT/ML IJ SOLN
INTRAMUSCULAR | Status: AC
  Filled 2019-05-25: qty 1

## 2019-05-25 MED ORDER — ALUM & MAG HYDROXIDE-SIMETH 200-200-20 MG/5ML PO SUSP
30.0000 mL | Freq: Four times a day (QID) | ORAL | Status: DC | PRN
  Administered 2019-05-26: 17:00:00 30 mL via ORAL
  Filled 2019-05-25 (×2): qty 30

## 2019-05-25 MED ORDER — SODIUM CHLORIDE 0.9 % IV SOLN
INTRAVENOUS | Status: AC | PRN
  Administered 2019-05-25: 500 mL via INTRAVENOUS

## 2019-05-25 MED ORDER — LIDOCAINE 2% (20 MG/ML) 5 ML SYRINGE
INTRAMUSCULAR | Status: DC | PRN
  Administered 2019-05-25: 100 mg via INTRAVENOUS

## 2019-05-25 MED ORDER — CALCIUM CARBONATE ANTACID 500 MG PO CHEW
2.0000 | CHEWABLE_TABLET | Freq: Once | ORAL | Status: AC
  Administered 2019-05-25: 13:00:00 400 mg via ORAL

## 2019-05-25 MED ORDER — ONDANSETRON HCL 4 MG/2ML IJ SOLN
INTRAMUSCULAR | Status: DC | PRN
  Administered 2019-05-25: 4 mg via INTRAVENOUS

## 2019-05-25 MED ORDER — FENTANYL CITRATE (PF) 100 MCG/2ML IJ SOLN
INTRAMUSCULAR | Status: AC
  Filled 2019-05-25: qty 2

## 2019-05-25 MED ORDER — PHENYLEPHRINE 40 MCG/ML (10ML) SYRINGE FOR IV PUSH (FOR BLOOD PRESSURE SUPPORT)
PREFILLED_SYRINGE | INTRAVENOUS | Status: DC | PRN
  Administered 2019-05-25: 120 ug via INTRAVENOUS
  Administered 2019-05-25 (×2): 80 ug via INTRAVENOUS
  Administered 2019-05-25: 120 ug via INTRAVENOUS

## 2019-05-25 MED ORDER — LIDOCAINE-EPINEPHRINE 1 %-1:100000 IJ SOLN
INTRAMUSCULAR | Status: AC
  Filled 2019-05-25: qty 1

## 2019-05-25 MED ORDER — SUCCINYLCHOLINE CHLORIDE 200 MG/10ML IV SOSY
PREFILLED_SYRINGE | INTRAVENOUS | Status: DC | PRN
  Administered 2019-05-25: 100 mg via INTRAVENOUS

## 2019-05-25 MED ORDER — CALCIUM CARBONATE ANTACID 500 MG PO CHEW
CHEWABLE_TABLET | ORAL | Status: AC
  Filled 2019-05-25: qty 2

## 2019-05-25 SURGICAL SUPPLY — 2 items
MONITOR REVEAL LINQ II (Prosthesis & Implant Heart) ×1 IMPLANT
PACK LOOP INSERTION (CUSTOM PROCEDURE TRAY) ×2 IMPLANT

## 2019-05-25 NOTE — CV Procedure (Signed)
     Transesophageal Echocardiogram Note  Max Pittman [id number] [birth year]  Procedure: Transesophageal Echocardiogram Indications: stroke  Procedure Details Consent: Obtained Time Out: Verified patient identification, verified procedure, site/side was marked, verified correct patient position, special equipment/implants available, Radiology Safety Procedures followed,  medications/allergies/relevent history reviewed, required imaging and test results available.  Performed  Medications: During this procedure the patient is administered a total of Propofol 160 mg and Fentanyl 100 mg to achieve and maintain general anesthesia performed by anesthesia staff.  The patient's heart rate, blood pressure, and oxygen saturation are monitored continuously during the procedure. The period of general anesthesia was 25  minutes, of which I was present face-to-face 100% of this time.  Left Ventrical:  LVEF 60-65%  Mitral Valve: mild MR, no vegetation  Aortic Valve: no AI, no vegetation  Tricuspid Valve: mild TR, no vegetation  Pulmonic Valve: no PR, no vegetation  Left Atrium/ Left atrial appendage: no thrombus, normal filling and emptying velocities  Atrial septum: hyperdynamic, no PFO, negative bubble study  Aorta: not visualized  Complications: No apparent complications Patient did tolerate procedure well.  Ena Dawley, MD, [facility] 05/25/2019, 9:18 AM

## 2019-05-25 NOTE — Interval H&P Note (Signed)
History and Physical Interval Note:  05/25/2019 8:21 AM  Max Pittman.  has presented today for surgery, with the diagnosis of stroke.  The various methods of treatment have been discussed with the patient and family. After consideration of risks, benefits and other options for treatment, the patient has consented to  Procedure(s): TRANSESOPHAGEAL ECHOCARDIOGRAM (TEE) (N/A) as a surgical intervention.  The patient's history has been reviewed, patient examined, no change in status, stable for surgery.  I have reviewed the patient's chart and labs.  Questions were answered to the patient's satisfaction.     Ena Dawley

## 2019-05-25 NOTE — Anesthesia Procedure Notes (Signed)
Procedure Name: Intubation Date/Time: 05/25/2019 8:27 AM Performed by: Moshe Salisbury, CRNA Pre-anesthesia Checklist: Patient identified, Emergency Drugs available, Suction available and Patient being monitored Patient Re-evaluated:Patient Re-evaluated prior to induction Oxygen Delivery Method: Circle System Utilized Preoxygenation: Pre-oxygenation with 100% oxygen Induction Type: IV induction Ventilation: Mask ventilation without difficulty Laryngoscope Size: Mac and 4 Grade View: Grade II Tube type: Oral Tube size: 8.0 mm Number of attempts: 1 Airway Equipment and Method: Stylet and Oral airway Placement Confirmation: ETT inserted through vocal cords under direct vision,  positive ETCO2 and breath sounds checked- equal and bilateral Secured at: 23 cm Tube secured with: Tape Dental Injury: Teeth and Oropharynx as per pre-operative assessment
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
## 2019-05-25 NOTE — Discharge Instructions (Signed)
Heart monitor wound care instructions (chest) Keep incision clean and dry for 3 days. You can remove outer dressing tomorrow. Leave steri-strips (little pieces of tape) on until seen in the office for wound check appointment. Call the office [phone]) for redness, drainage, swelling, or fever.
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
## 2019-05-25 NOTE — Progress Notes (Signed)
Another projectile vomiting episode - brown "coffee-ground" appearance. Pt has completed dialysis at this time. Vitals ok. Had already spoke to trauma PA earlier about his 1st episode. Will communicate to his floor RN that this recurred. Hgb 8.2 this morning. Pt cleaned up.   Blood pressure (!) 164/69, pulse (!) 102, temperature 98.2 F (36.8 C), temperature source Oral, resp. rate 20, height 5\' 9"  (1.753 m), weight 132.7 kg, SpO2 95 %.  Veneta Penton, PA-C [employer] Pager [phone]

## 2019-05-25 NOTE — Interval H&P Note (Signed)
History and Physical Interval Note:  05/25/2019 10:40 AM  Max Pittman.  has presented today for surgery, with the diagnosis of syncope, stroke.  The various methods of treatment have been discussed with the patient and family. After consideration of risks, benefits and other options for treatment, the patient has consented to  Procedure(s): LOOP RECORDER INSERTION (N/A) as a surgical intervention.  The patient's history has been reviewed, patient examined, no change in status, stable for surgery.  I have reviewed the patient's chart and labs.  Questions were answered to the patient's satisfaction.     [name]

## 2019-05-25 NOTE — Progress Notes (Signed)
PT Cancellation Note  Patient Details Name: Max Pittman. MRN: [id number] DOB: September 08, 1975   Cancelled Treatment:    Reason Eval/Treat Not Completed: Patient at procedure or test/unavailable. Have attempted x 3 today. Pt at TEE, then loop recorder, and now HD. Will try again tomorrow.    Shary Decamp [place] 05/25/2019, 2:22 PM [place] Pager [phone] Office [phone]
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
## 2019-05-25 NOTE — Progress Notes (Signed)
PT Cancellation Note  Patient Details Name: Max Pittman. MRN: [id number] DOB: 1975-12-31   Cancelled Treatment:    Reason Eval/Treat Not Completed: Patient at procedure or test/unavailable. Pt at TEE. Will continue attempts.   Shary Decamp [facility] 05/25/2019, 9:45 AM [place] Pager [phone] Office [phone]

## 2019-05-25 NOTE — Transfer of Care (Signed)
Immediate Anesthesia Transfer of Care Note  Patient: Max Pittman.  Procedure(s) Performed: TRANSESOPHAGEAL ECHOCARDIOGRAM (TEE) (N/A ) BUBBLE STUDY  Patient Location: Endoscopy Unit  Anesthesia Type:General  Level of Consciousness: drowsy and patient cooperative  Airway & Oxygen Therapy: Patient Spontanous Breathing and Patient connected to nasal cannula oxygen  Post-op Assessment: Report given to RN and Post -op Vital signs reviewed and stable  Post vital signs: Reviewed and stable  Last Vitals:  Vitals Value Taken Time  BP 124/63 05/25/19 0903  Temp    Pulse 96 05/25/19 0904  Resp 23 05/25/19 0904  SpO2 92 % 05/25/19 0904  Vitals shown include unvalidated device data.  Last Pain:  Vitals:   05/25/19 0713  TempSrc: Oral  PainSc: 7       Patients Stated Pain Goal: 3 (03/83/33 8329)  Complications: No apparent anesthesia complications

## 2019-05-25 NOTE — Progress Notes (Signed)
  Echocardiogram Echocardiogram Transesophageal has been performed.  Max Pittman 05/25/2019, 8:59 AM
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
## 2019-05-26 LAB — COMPREHENSIVE METABOLIC PANEL
ALT: 11 U/L (ref 0–44)
AST: 82 U/L — ABNORMAL HIGH (ref 15–41)
Albumin: 1.8 g/dL — ABNORMAL LOW (ref 3.5–5.0)
Alkaline Phosphatase: 56 U/L (ref 38–126)
Anion gap: 14 (ref 5–15)
BUN: 42 mg/dL — ABNORMAL HIGH (ref 6–20)
CO2: 24 mmol/L (ref 22–32)
Calcium: 8.3 mg/dL — ABNORMAL LOW (ref 8.9–10.3)
Chloride: 95 mmol/L — ABNORMAL LOW (ref 98–111)
Creatinine, Ser: 8.39 mg/dL — ABNORMAL HIGH (ref 0.61–1.24)
GFR calc Af Amer: 8 mL/min — ABNORMAL LOW (ref >60)
GFR calc non Af Amer: 7 mL/min — ABNORMAL LOW (ref >60)
Glucose, Bld: 125 mg/dL — ABNORMAL HIGH (ref 70–99)
Potassium: 3.2 mmol/L — ABNORMAL LOW (ref 3.5–5.1)
Sodium: 133 mmol/L — ABNORMAL LOW (ref 135–145)
Total Bilirubin: 1.5 mg/dL — ABNORMAL HIGH (ref 0.3–1.2)
Total Protein: 5.2 g/dL — ABNORMAL LOW (ref 6.5–8.1)

## 2019-05-26 LAB — GLUCOSE, CAPILLARY
Glucose-Capillary: 154 mg/dL — ABNORMAL HIGH (ref 70–99)
Glucose-Capillary: 108 mg/dL — ABNORMAL HIGH (ref 70–99)
Glucose-Capillary: 145 mg/dL — ABNORMAL HIGH (ref 70–99)

## 2019-05-26 LAB — CBC
HCT: 23.4 % — ABNORMAL LOW (ref 39.0–52.0)
Hemoglobin: 7.9 g/dL — ABNORMAL LOW (ref 13.0–17.0)
MCH: 30.5 pg (ref 26.0–34.0)
MCHC: 33.8 g/dL (ref 30.0–36.0)
MCV: 90.3 fL (ref 80.0–100.0)
Platelets: 237 10*3/uL (ref 150–400)
RBC: 2.59 MIL/uL — ABNORMAL LOW (ref 4.22–5.81)
RDW: 15.2 % (ref 11.5–15.5)
WBC: 10.7 10*3/uL — ABNORMAL HIGH (ref 4.0–10.5)
nRBC: 0.8 % — ABNORMAL HIGH (ref 0.0–0.2)

## 2019-05-26 LAB — PROTIME-INR
INR: 1.1 (ref 0.8–1.2)
Prothrombin Time: 14 seconds (ref 11.4–15.2)

## 2019-05-26 LAB — MAGNESIUM: Magnesium: 2 mg/dL (ref 1.7–2.4)

## 2019-05-26 MED ORDER — LIDOCAINE 5 % EX PTCH
1.0000 | MEDICATED_PATCH | CUTANEOUS | Status: DC
  Administered 2019-05-26 – 2019-06-01 (×7): 1 via TRANSDERMAL
  Filled 2019-05-26 (×8): qty 1

## 2019-05-26 MED ORDER — POLYETHYLENE GLYCOL 3350 17 G PO PACK
17.0000 g | PACK | Freq: Every day | ORAL | Status: DC
  Administered 2019-05-26 – 2019-05-27 (×2): 17 g via ORAL
  Filled 2019-05-26 (×2): qty 1

## 2019-05-26 MED ORDER — CHLORHEXIDINE GLUCONATE CLOTH 2 % EX PADS: 6.0000 | MEDICATED_PAD | Freq: Every day | CUTANEOUS | Status: DC

## 2019-05-26 MED ORDER — OXYCODONE HCL 5 MG PO TABS
10.0000 mg | ORAL_TABLET | ORAL | Status: DC | PRN
  Administered 2019-05-26 – 2019-05-27 (×4): 15 mg via ORAL
  Filled 2019-05-26 (×4): qty 3

## 2019-05-26 MED ORDER — HYDROMORPHONE HCL 1 MG/ML IJ SOLN
0.5000 mg | Freq: Three times a day (TID) | INTRAMUSCULAR | Status: DC | PRN
  Administered 2019-05-26 – 2019-05-27 (×2): 0.5 mg via INTRAVENOUS
  Filled 2019-05-26 (×2): qty 1

## 2019-05-26 MED ORDER — ASPIRIN EC 81 MG PO TBEC
81.0000 mg | DELAYED_RELEASE_TABLET | Freq: Every day | ORAL | Status: DC
  Administered 2019-05-27: 81 mg via ORAL
  Filled 2019-05-26: qty 1

## 2019-05-26 MED ORDER — POTASSIUM CHLORIDE CRYS ER 20 MEQ PO TBCR
20.0000 meq | EXTENDED_RELEASE_TABLET | Freq: Once | ORAL | Status: AC
  Administered 2019-05-26: 20 meq via ORAL
  Filled 2019-05-26: qty 1

## 2019-05-26 MED ORDER — POTASSIUM CHLORIDE 20 MEQ PO PACK
40.0000 meq | PACK | Freq: Two times a day (BID) | ORAL | Status: DC
  Filled 2019-05-26: qty 2

## 2019-05-26 MED ORDER — LINACLOTIDE 145 MCG PO CAPS
145.0000 ug | ORAL_CAPSULE | Freq: Every day | ORAL | Status: DC
  Administered 2019-05-27: 145 ug via ORAL
  Filled 2019-05-26: qty 1

## 2019-05-26 MED ORDER — SENNOSIDES-DOCUSATE SODIUM 8.6-50 MG PO TABS
1.0000 | ORAL_TABLET | Freq: Two times a day (BID) | ORAL | Status: DC
  Administered 2019-05-26 – 2019-06-01 (×12): 1 via ORAL
  Filled 2019-05-26 (×13): qty 1

## 2019-05-26 MED ORDER — WARFARIN SODIUM 7.5 MG PO TABS
7.5000 mg | ORAL_TABLET | Freq: Once | ORAL | Status: AC
  Administered 2019-05-26: 7.5 mg via ORAL
  Filled 2019-05-26: qty 1
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
## 2019-05-26 NOTE — Care Management Important Message (Signed)
Important Message  Patient Details  Name: Max Pittman. MRN: [id number] Date of Birth: 1975/03/02   Medicare Important Message Given:  Yes     Shelda Altes 05/26/2019, 9:18 AM

## 2019-05-26 NOTE — Progress Notes (Signed)
Spoke with Dr. Leonie Man.  Okay for Coumadin.  Can DC Plavix.  Recommends continuing baby aspirin daily.  If Coumadin is held, resume dual antiplatelet therapies with aspirin and Plavix for a total of 3 weeks.

## 2019-05-26 NOTE — Progress Notes (Signed)
Inpatient Rehabilitation-Admissions Coordinator   Met with pt bedside as follow up from PM&R consult completed by Dr. Dagoberto Ligas on 4/29 (see consult note for details). Pt interested in the program and recognizes a need for rehab at this time. Feel he is a good candidate for our program. I have confirmed DC support with pt's girlfriend. Noted we are awaiting possible GI consult. Will follow for medical readiness and possible admit to CIR when ready.   Raechel Ache, OTR/L  Rehab Admissions Coordinator  [phone] 05/26/2019 11:42 AM
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
## 2019-05-26 NOTE — Anesthesia Postprocedure Evaluation (Signed)
Anesthesia Post Note  Patient: Max Pittman.  Procedure(s) Performed: TRANSESOPHAGEAL ECHOCARDIOGRAM (TEE) (N/A ) BUBBLE STUDY     Patient location during evaluation: PACU Anesthesia Type: MAC Level of consciousness: awake and alert Pain management: pain level controlled Vital Signs Assessment: post-procedure vital signs reviewed and stable Respiratory status: spontaneous breathing Cardiovascular status: stable Anesthetic complications: no    Last Vitals:  Vitals:   05/26/19 0802 05/26/19 1146  BP: 127/82 115/74  Pulse: 61 79  Resp: 20 16  Temp: 36.4 C 36.6 C  SpO2: 96% 96%    Last Pain:  Vitals:   05/26/19 1651  TempSrc:   PainSc: [place]
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
## 2019-05-27 ENCOUNTER — Inpatient Hospital Stay (HOSPITAL_COMMUNITY): Payer: Medicare Other

## 2019-05-27 LAB — BASIC METABOLIC PANEL
Anion gap: 18 — ABNORMAL HIGH (ref 5–15)
BUN: 63 mg/dL — ABNORMAL HIGH (ref 6–20)
CO2: 25 mmol/L (ref 22–32)
Calcium: 9.4 mg/dL (ref 8.9–10.3)
Chloride: 88 mmol/L — ABNORMAL LOW (ref 98–111)
Creatinine, Ser: 10.19 mg/dL — ABNORMAL HIGH (ref 0.61–1.24)
GFR calc Af Amer: 6 mL/min — ABNORMAL LOW (ref >60)
GFR calc non Af Amer: 6 mL/min — ABNORMAL LOW (ref >60)
Glucose, Bld: 160 mg/dL — ABNORMAL HIGH (ref 70–99)
Potassium: 3.3 mmol/L — ABNORMAL LOW (ref 3.5–5.1)
Sodium: 131 mmol/L — ABNORMAL LOW (ref 135–145)

## 2019-05-27 LAB — CBC
HCT: 29.4 % — ABNORMAL LOW (ref 39.0–52.0)
Hemoglobin: 9.5 g/dL — ABNORMAL LOW (ref 13.0–17.0)
MCH: 29.7 pg (ref 26.0–34.0)
MCHC: 32.3 g/dL (ref 30.0–36.0)
MCV: 91.9 fL (ref 80.0–100.0)
Platelets: 362 10*3/uL (ref 150–400)
RBC: 3.2 MIL/uL — ABNORMAL LOW (ref 4.22–5.81)
RDW: 15.2 % (ref 11.5–15.5)
WBC: 12.9 10*3/uL — ABNORMAL HIGH (ref 4.0–10.5)
nRBC: 1.5 % — ABNORMAL HIGH (ref 0.0–0.2)

## 2019-05-27 LAB — GLUCOSE, CAPILLARY
Glucose-Capillary: 114 mg/dL — ABNORMAL HIGH (ref 70–99)
Glucose-Capillary: 148 mg/dL — ABNORMAL HIGH (ref 70–99)
Glucose-Capillary: 137 mg/dL — ABNORMAL HIGH (ref 70–99)
Glucose-Capillary: 80 mg/dL (ref 70–99)

## 2019-05-27 LAB — PROTIME-INR
INR: 1.1 (ref 0.8–1.2)
Prothrombin Time: 13.3 seconds (ref 11.4–15.2)

## 2019-05-27 LAB — MAGNESIUM: Magnesium: 2.6 mg/dL — ABNORMAL HIGH (ref 1.7–2.4)

## 2019-05-27 IMAGING — DX DG ABD PORTABLE 1V
1 series · 1 of 1 positions shown · non-contrast
Comparison: None.

CLINICAL DATA: Confirm nasogastric tube placement.

EXAM:
PORTABLE ABDOMEN - 1 VIEW

[abdomen kub]
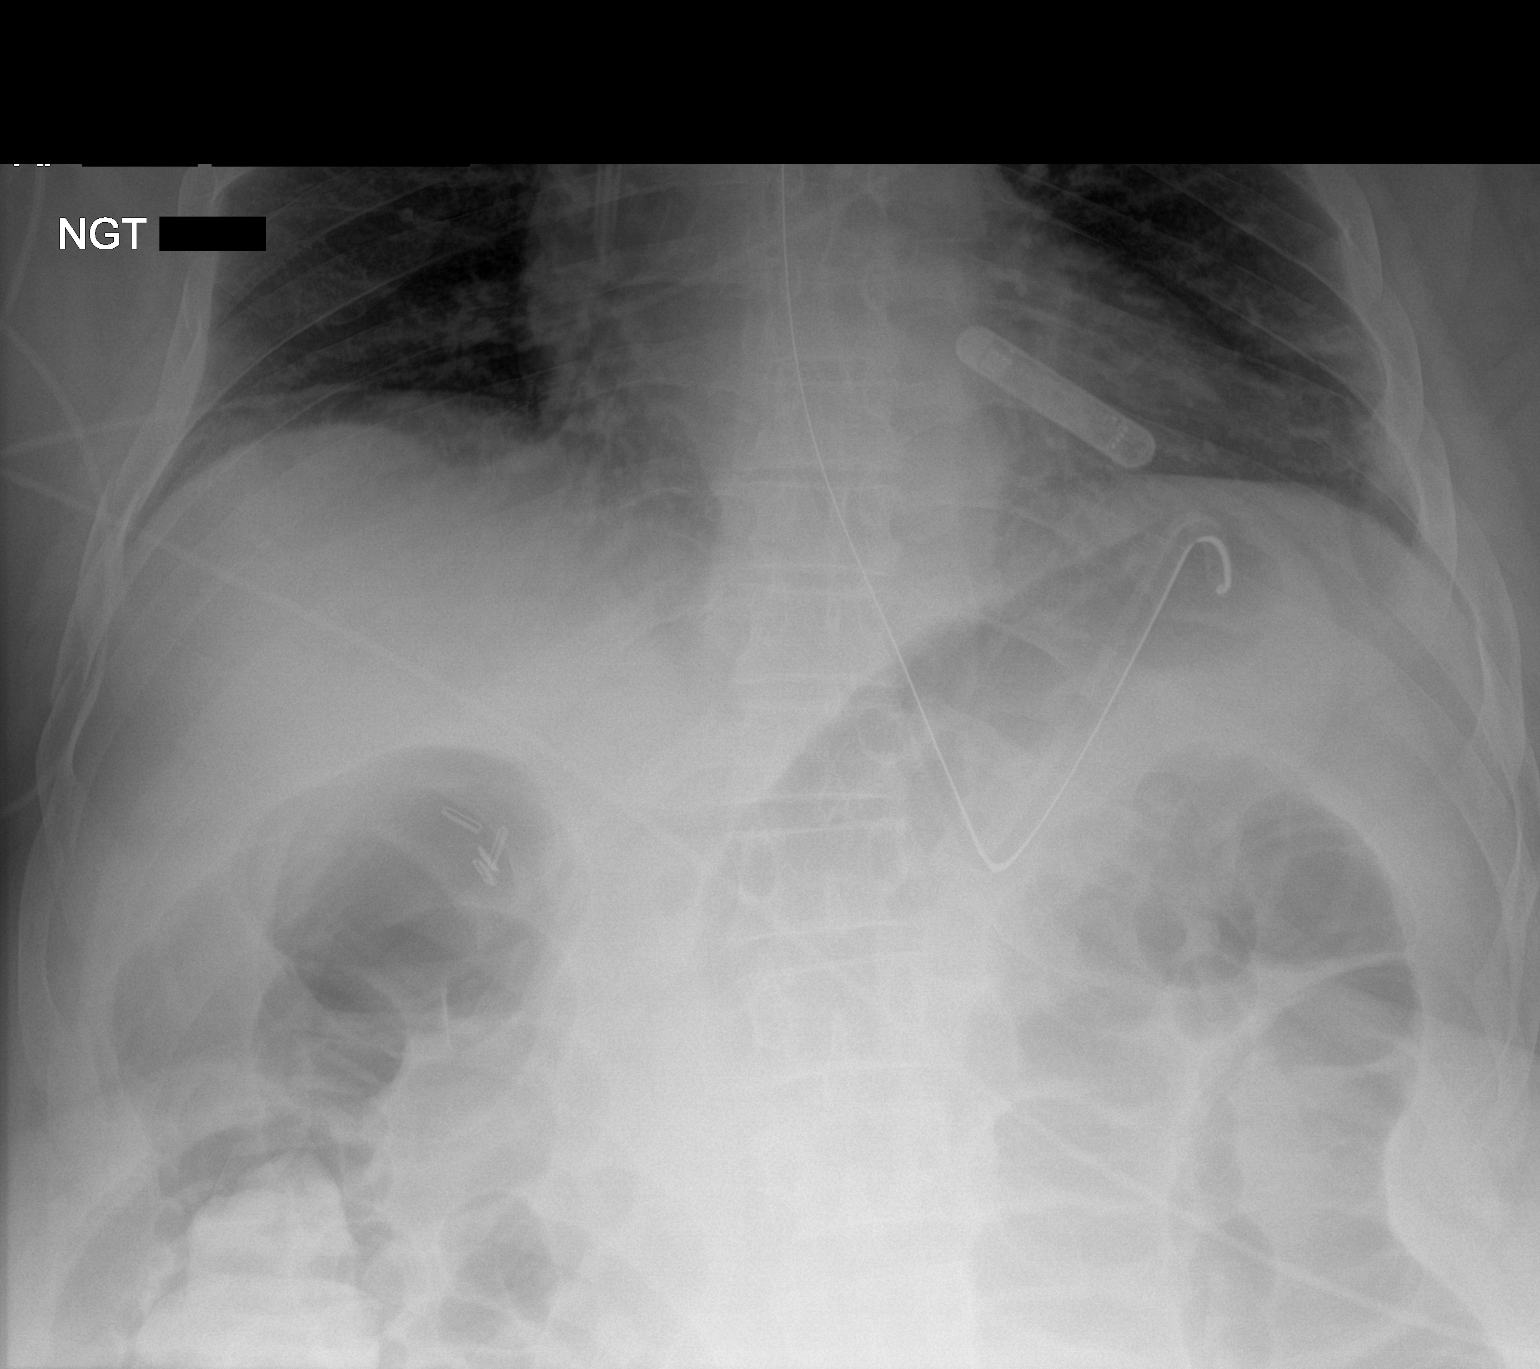

[1 of 1 positions shown; findings below may reference images not displayed]

FINDINGS: Tip and side port of the enteric tube below the diaphragm in the
stomach. Mild gaseous distention of colon in the upper abdomen.
Cholecystectomy clips. Implanted loop recorder is partially
visualized.
IMPRESSION: Tip and side port of the enteric tube below the diaphragm in the
stomach.

## 2019-05-27 MED ORDER — ENOXAPARIN SODIUM 40 MG/0.4ML ~~LOC~~ SOLN
40.0000 mg | SUBCUTANEOUS | Status: DC
  Administered 2019-05-27: 40 mg via SUBCUTANEOUS
  Filled 2019-05-27: qty 0.4

## 2019-05-27 MED ORDER — POTASSIUM CHLORIDE CRYS ER 20 MEQ PO TBCR
20.0000 meq | EXTENDED_RELEASE_TABLET | Freq: Once | ORAL | Status: AC
  Administered 2019-05-27: 20 meq via ORAL
  Filled 2019-05-27: qty 1

## 2019-05-27 MED ORDER — HEPARIN SODIUM (PORCINE) 5000 UNIT/ML IJ SOLN: 5000.0000 [IU] | Freq: Three times a day (TID) | INTRAMUSCULAR | Status: DC

## 2019-05-27 MED ORDER — POTASSIUM CHLORIDE 10 MEQ/100ML IV SOLN
10.0000 meq | INTRAVENOUS | Status: AC
  Administered 2019-05-27 (×2): 10 meq via INTRAVENOUS
  Filled 2019-05-27 (×2): qty 100

## 2019-05-27 MED ORDER — METHOCARBAMOL 1000 MG/10ML IJ SOLN
1000.0000 mg | Freq: Three times a day (TID) | INTRAVENOUS | Status: DC
  Administered 2019-05-27 – 2019-06-01 (×15): 1000 mg via INTRAVENOUS
  Filled 2019-05-27 (×15): qty 10
  Filled 2019-05-27: qty 1000
  Filled 2019-05-27 (×3): qty 10

## 2019-05-27 MED ORDER — HEPARIN SODIUM (PORCINE) 5000 UNIT/ML IJ SOLN
5000.0000 [IU] | Freq: Three times a day (TID) | INTRAMUSCULAR | Status: DC
  Administered 2019-05-28 – 2019-05-29 (×4): 5000 [IU] via SUBCUTANEOUS
  Filled 2019-05-27 (×5): qty 1

## 2019-05-27 MED ORDER — HEPARIN SODIUM (PORCINE) 1000 UNIT/ML IJ SOLN
INTRAMUSCULAR | Status: AC
  Administered 2019-05-27: 3200 [IU] via INTRAVENOUS_CENTRAL
  Filled 2019-05-27: qty 4

## 2019-05-27 MED ORDER — HYDROMORPHONE HCL 1 MG/ML IJ SOLN
0.5000 mg | INTRAMUSCULAR | Status: DC | PRN
  Administered 2019-05-27 – 2019-06-01 (×19): 0.5 mg via INTRAVENOUS
  Filled 2019-05-27 (×18): qty 1

## 2019-05-27 MED ORDER — ACETAMINOPHEN 10 MG/ML IV SOLN
1000.0000 mg | Freq: Four times a day (QID) | INTRAVENOUS | Status: AC
  Administered 2019-05-28 (×2): 1000 mg via INTRAVENOUS
  Filled 2019-05-27 (×4): qty 100

## 2019-05-27 MED ORDER — ASPIRIN 300 MG RE SUPP: 150.0000 mg | Freq: Once | RECTAL | Status: DC
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
## 2019-05-27 NOTE — Progress Notes (Signed)
Pt back from HD- vitals obtained. CBG obtained (80- will be checking Q4 since he is NPO). NG tube hooked back up, emptied about 1044mL green drainage from cannister, flushed and assessed as well. Given available PRNs for pain. Will continue to closely monitor pt.
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
## 2019-05-27 NOTE — Progress Notes (Signed)
Inserted NG tube. Immediate return of 600 ml.  Patient tolerated well. Will continue to monitor. Paulene Floor, RN
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
## 2019-05-28 ENCOUNTER — Inpatient Hospital Stay (HOSPITAL_COMMUNITY): Payer: Medicare Other

## 2019-05-28 LAB — BASIC METABOLIC PANEL
Anion gap: 19 — ABNORMAL HIGH (ref 5–15)
BUN: 44 mg/dL — ABNORMAL HIGH (ref 6–20)
CO2: 24 mmol/L (ref 22–32)
Calcium: 9.2 mg/dL (ref 8.9–10.3)
Chloride: 90 mmol/L — ABNORMAL LOW (ref 98–111)
Creatinine, Ser: 7.73 mg/dL — ABNORMAL HIGH (ref 0.61–1.24)
GFR calc Af Amer: 9 mL/min — ABNORMAL LOW (ref >60)
GFR calc non Af Amer: 8 mL/min — ABNORMAL LOW (ref >60)
Glucose, Bld: 215 mg/dL — ABNORMAL HIGH (ref 70–99)
Potassium: 4.5 mmol/L (ref 3.5–5.1)
Sodium: 133 mmol/L — ABNORMAL LOW (ref 135–145)

## 2019-05-28 LAB — CBC
HCT: 30.4 % — ABNORMAL LOW (ref 39.0–52.0)
Hemoglobin: 9.6 g/dL — ABNORMAL LOW (ref 13.0–17.0)
MCH: 29.6 pg (ref 26.0–34.0)
MCHC: 31.6 g/dL (ref 30.0–36.0)
MCV: 93.8 fL (ref 80.0–100.0)
Platelets: 389 10*3/uL (ref 150–400)
RBC: 3.24 MIL/uL — ABNORMAL LOW (ref 4.22–5.81)
RDW: 16.4 % — ABNORMAL HIGH (ref 11.5–15.5)
WBC: 16.3 10*3/uL — ABNORMAL HIGH (ref 4.0–10.5)
nRBC: 3.3 % — ABNORMAL HIGH (ref 0.0–0.2)

## 2019-05-28 LAB — GLUCOSE, CAPILLARY
Glucose-Capillary: 104 mg/dL — ABNORMAL HIGH (ref 70–99)
Glucose-Capillary: 111 mg/dL — ABNORMAL HIGH (ref 70–99)
Glucose-Capillary: 114 mg/dL — ABNORMAL HIGH (ref 70–99)
Glucose-Capillary: 148 mg/dL — ABNORMAL HIGH (ref 70–99)
Glucose-Capillary: 86 mg/dL (ref 70–99)
Glucose-Capillary: 90 mg/dL (ref 70–99)
Glucose-Capillary: 96 mg/dL (ref 70–99)

## 2019-05-28 LAB — MAGNESIUM: Magnesium: 2.4 mg/dL (ref 1.7–2.4)

## 2019-05-28 LAB — PHOSPHORUS: Phosphorus: 5.4 mg/dL — ABNORMAL HIGH (ref 2.5–4.6)

## 2019-05-28 IMAGING — DX DG ABD PORTABLE 1V
1 series · 2 of 2 positions shown · non-contrast
Comparison: [DATE] and pelvis [DATE]

CLINICAL DATA: Ileus.  Abdominal distension.

EXAM:
PORTABLE ABDOMEN - 1 VIEW

[Series 1: abdomen · 0.14mm/px · 2 of 2 slices shown]
[im 1/2]
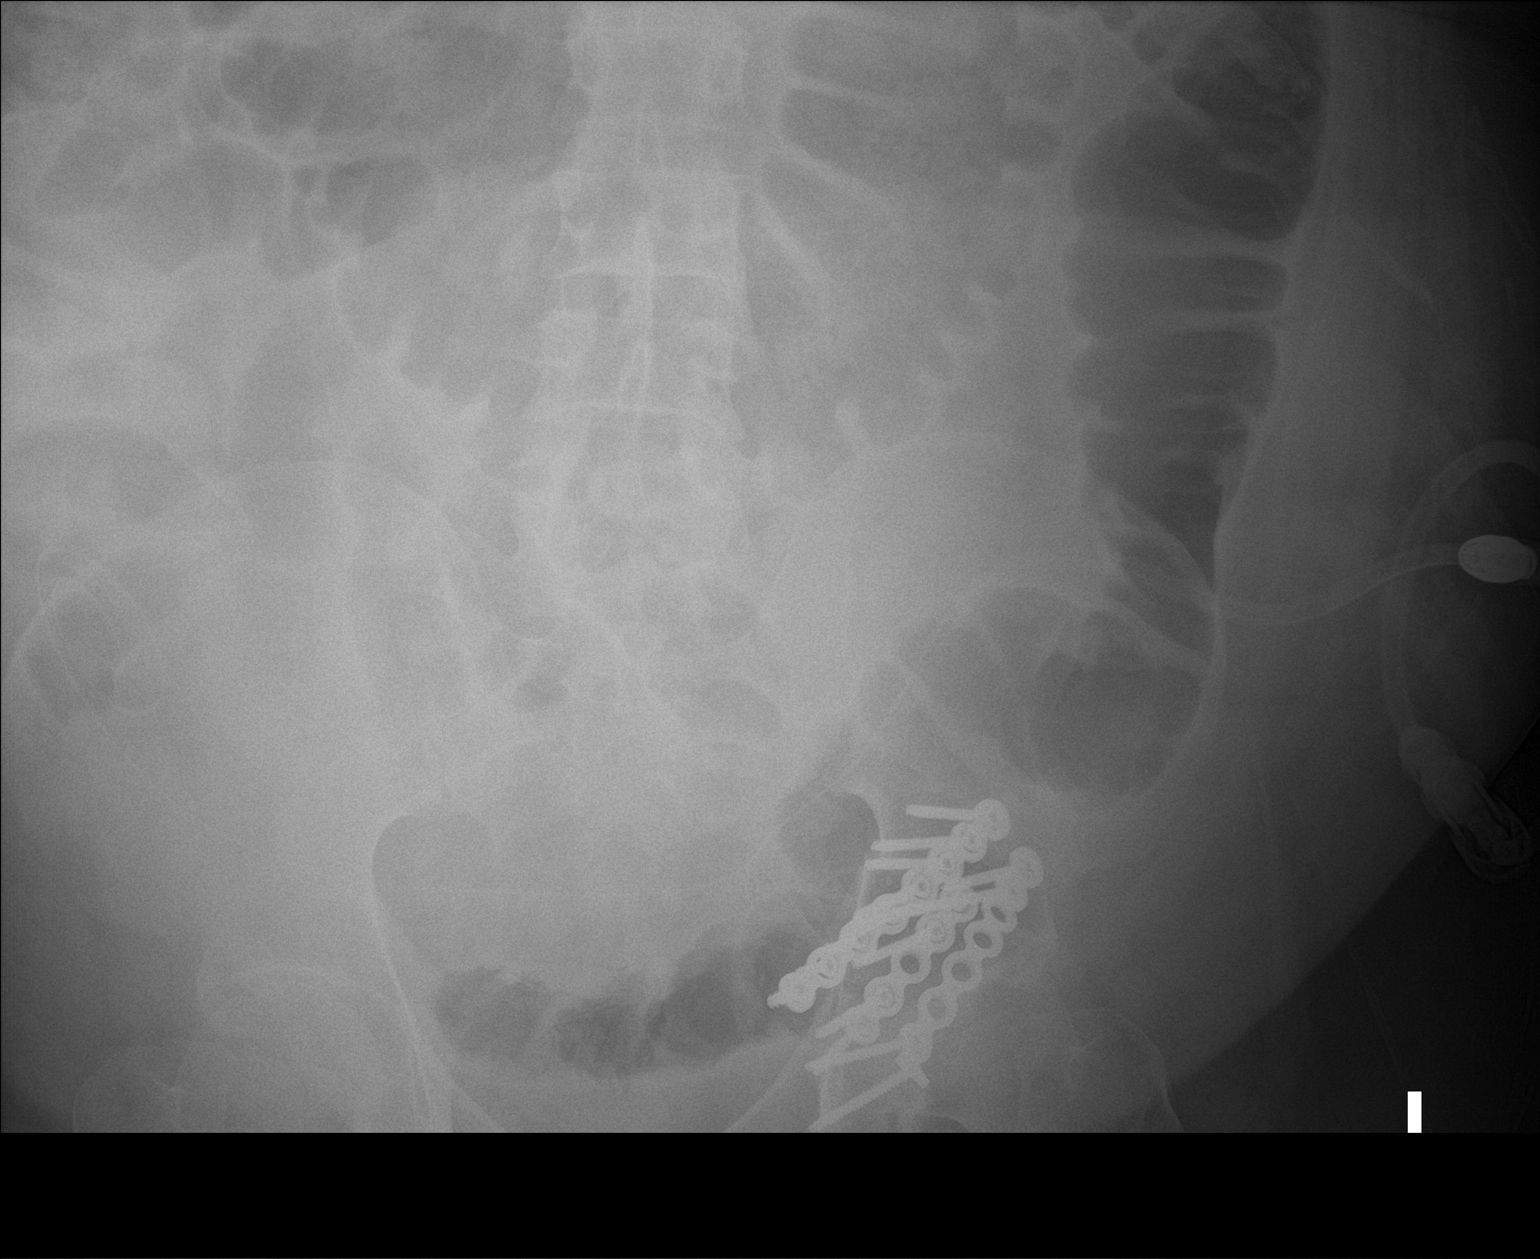
[im 2/2]
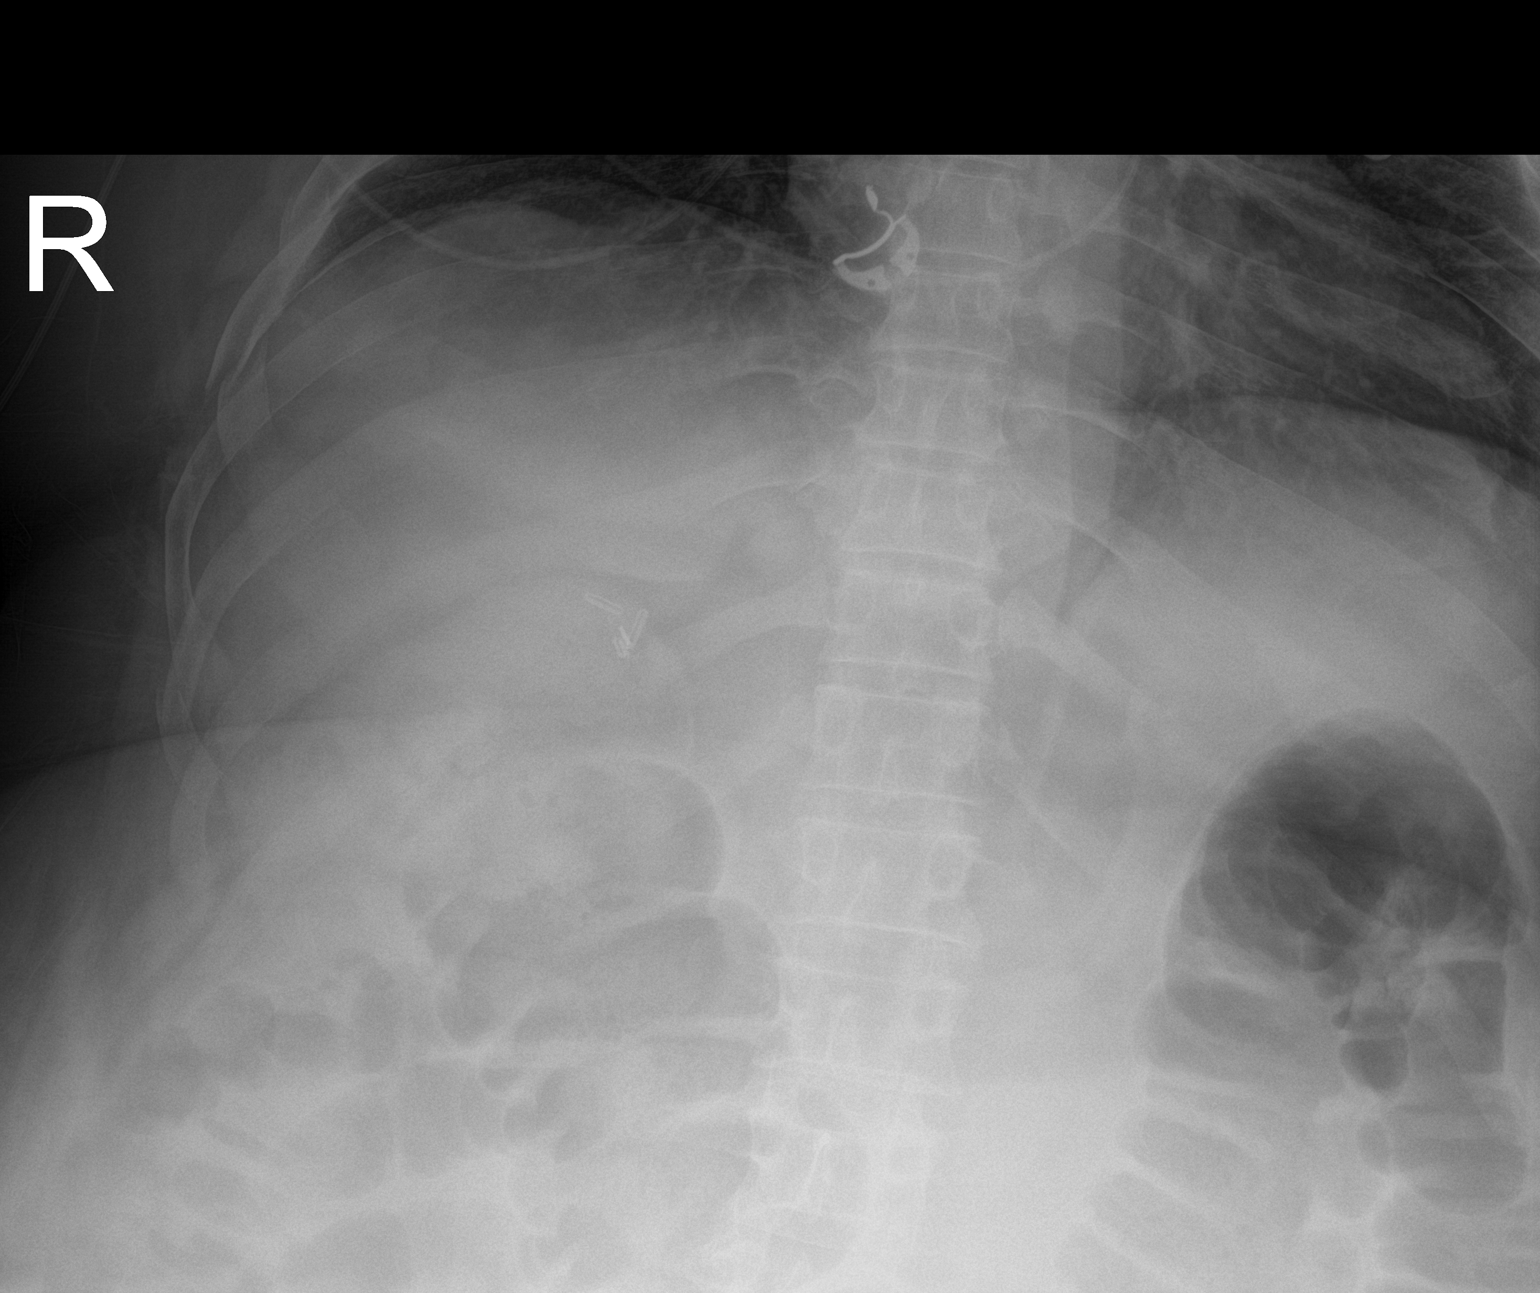

[2 of 2 positions shown; findings below may reference images not displayed]

FINDINGS: Bowel gas pattern is nonobstructive with air present throughout the
colon. Peritoneal dialysis catheter present with tip over the right
lower quadrant. Fixation hardware over the left pelvis/acetabulum
intact fixating patient's known acetabular fracture. Degenerative
change of the right hip.
IMPRESSION: Nonobstructive bowel gas pattern.

## 2019-05-28 MED ORDER — BISACODYL 10 MG RE SUPP
10.0000 mg | Freq: Every day | RECTAL | Status: DC
  Administered 2019-05-28 – 2019-05-31 (×2): 10 mg via RECTAL
  Filled 2019-05-28 (×7): qty 1

## 2019-05-28 MED ORDER — CHLORHEXIDINE GLUCONATE CLOTH 2 % EX PADS: 6.0000 | MEDICATED_PAD | Freq: Every day | CUTANEOUS | Status: DC

## 2019-05-28 MED ORDER — CARVEDILOL 12.5 MG PO TABS
12.5000 mg | ORAL_TABLET | Freq: Two times a day (BID) | ORAL | Status: DC
  Administered 2019-05-28 – 2019-06-02 (×9): 12.5 mg via ORAL
  Filled 2019-05-28 (×10): qty 1
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
## 2019-05-28 NOTE — Progress Notes (Addendum)
Pt had a very large BM in bed this pm- also assisted to [facility] for additional large BM. No abdominal complaints. Will continue to monitor.

## 2019-05-28 NOTE — Progress Notes (Signed)
Wound vac came unattached. Dressing reinforced. Now suctioning. Will continue to monitor.  Paulene Floor, RN
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
## 2019-05-29 LAB — CBC
HCT: 26.7 % — ABNORMAL LOW (ref 39.0–52.0)
Hemoglobin: 8.4 g/dL — ABNORMAL LOW (ref 13.0–17.0)
MCH: 29.6 pg (ref 26.0–34.0)
MCHC: 31.5 g/dL (ref 30.0–36.0)
MCV: 94 fL (ref 80.0–100.0)
Platelets: 310 10*3/uL (ref 150–400)
RBC: 2.84 MIL/uL — ABNORMAL LOW (ref 4.22–5.81)
RDW: 16.1 % — ABNORMAL HIGH (ref 11.5–15.5)
WBC: 10.3 10*3/uL (ref 4.0–10.5)
nRBC: 1.1 % — ABNORMAL HIGH (ref 0.0–0.2)

## 2019-05-29 LAB — GLUCOSE, CAPILLARY
Glucose-Capillary: 75 mg/dL (ref 70–99)
Glucose-Capillary: 133 mg/dL — ABNORMAL HIGH (ref 70–99)
Glucose-Capillary: 88 mg/dL (ref 70–99)
Glucose-Capillary: 105 mg/dL — ABNORMAL HIGH (ref 70–99)

## 2019-05-29 LAB — BASIC METABOLIC PANEL
Anion gap: 17 — ABNORMAL HIGH (ref 5–15)
BUN: 64 mg/dL — ABNORMAL HIGH (ref 6–20)
CO2: 20 mmol/L — ABNORMAL LOW (ref 22–32)
Calcium: 8.7 mg/dL — ABNORMAL LOW (ref 8.9–10.3)
Chloride: 94 mmol/L — ABNORMAL LOW (ref 98–111)
Creatinine, Ser: 8.81 mg/dL — ABNORMAL HIGH (ref 0.61–1.24)
GFR calc Af Amer: 8 mL/min — ABNORMAL LOW (ref >60)
GFR calc non Af Amer: 7 mL/min — ABNORMAL LOW (ref >60)
Glucose, Bld: 137 mg/dL — ABNORMAL HIGH (ref 70–99)
Potassium: 3.5 mmol/L (ref 3.5–5.1)
Sodium: 131 mmol/L — ABNORMAL LOW (ref 135–145)

## 2019-05-29 LAB — PHOSPHORUS: Phosphorus: 5.8 mg/dL — ABNORMAL HIGH (ref 2.5–4.6)

## 2019-05-29 LAB — MAGNESIUM: Magnesium: 2.4 mg/dL (ref 1.7–2.4)

## 2019-05-29 LAB — PROTIME-INR
INR: 2.8 — ABNORMAL HIGH (ref 0.8–1.2)
Prothrombin Time: 28.2 s — ABNORMAL HIGH (ref 11.4–15.2)

## 2019-05-29 MED ORDER — POTASSIUM CHLORIDE CRYS ER 20 MEQ PO TBCR: 40.0000 meq | EXTENDED_RELEASE_TABLET | Freq: Once | ORAL | Status: DC

## 2019-05-29 MED ORDER — SEVELAMER CARBONATE 800 MG PO TABS
2400.0000 mg | ORAL_TABLET | Freq: Three times a day (TID) | ORAL | Status: DC
  Administered 2019-05-29 – 2019-06-02 (×10): 2400 mg via ORAL
  Filled 2019-05-29 (×10): qty 3

## 2019-05-29 MED ORDER — OXYCODONE HCL 5 MG PO TABS
10.0000 mg | ORAL_TABLET | ORAL | Status: DC | PRN
  Administered 2019-05-29 – 2019-06-02 (×17): 15 mg via ORAL
  Filled 2019-05-29 (×16): qty 3

## 2019-05-29 MED ORDER — ACETAMINOPHEN 500 MG PO TABS
1000.0000 mg | ORAL_TABLET | Freq: Four times a day (QID) | ORAL | Status: DC
  Administered 2019-05-29 – 2019-06-02 (×16): 1000 mg via ORAL
  Filled 2019-05-29 (×17): qty 2

## 2019-05-29 MED ORDER — POTASSIUM CHLORIDE CRYS ER 20 MEQ PO TBCR
40.0000 meq | EXTENDED_RELEASE_TABLET | Freq: Two times a day (BID) | ORAL | Status: AC
  Administered 2019-05-29 (×2): 40 meq via ORAL
  Filled 2019-05-29 (×2): qty 2

## 2019-05-29 MED ORDER — WARFARIN - PHARMACIST DOSING INPATIENT: Freq: Every day | Status: DC

## 2019-05-29 NOTE — Progress Notes (Signed)
HD orders modified  For 05/30/2019 by Dr Marval Regal. Primary RN [place] notified at [phone]
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
## 2019-05-29 NOTE — Progress Notes (Signed)
Inpatient Rehabilitation-Admissions Coordinator   [facility] continues to follow pt for medical readiness. Per trauma note, ileus improving and plan to trail clear liquids. Will follow for tolerance with diet advancement.  Will follow up tomorrow.   Raechel Ache, OTR/L  Rehab Admissions Coordinator  [phone] 05/29/2019 11:27 AM
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
## 2019-05-30 LAB — CBC
HCT: 28.5 % — ABNORMAL LOW (ref 39.0–52.0)
Hemoglobin: 8.9 g/dL — ABNORMAL LOW (ref 13.0–17.0)
MCH: 30.1 pg (ref 26.0–34.0)
MCHC: 31.2 g/dL (ref 30.0–36.0)
MCV: 96.3 fL (ref 80.0–100.0)
Platelets: 328 10*3/uL (ref 150–400)
RBC: 2.96 MIL/uL — ABNORMAL LOW (ref 4.22–5.81)
RDW: 17.1 % — ABNORMAL HIGH (ref 11.5–15.5)
WBC: 10.6 10*3/uL — ABNORMAL HIGH (ref 4.0–10.5)
nRBC: 1.4 % — ABNORMAL HIGH (ref 0.0–0.2)

## 2019-05-30 LAB — BASIC METABOLIC PANEL
Anion gap: 15 (ref 5–15)
Anion gap: 14 (ref 5–15)
BUN: 73 mg/dL — ABNORMAL HIGH (ref 6–20)
BUN: 29 mg/dL — ABNORMAL HIGH (ref 6–20)
CO2: 22 mmol/L (ref 22–32)
CO2: 25 mmol/L (ref 22–32)
Calcium: 8.8 mg/dL — ABNORMAL LOW (ref 8.9–10.3)
Calcium: 8.5 mg/dL — ABNORMAL LOW (ref 8.9–10.3)
Chloride: 90 mmol/L — ABNORMAL LOW (ref 98–111)
Chloride: 94 mmol/L — ABNORMAL LOW (ref 98–111)
Creatinine, Ser: 10.08 mg/dL — ABNORMAL HIGH (ref 0.61–1.24)
Creatinine, Ser: 5.36 mg/dL — ABNORMAL HIGH (ref 0.61–1.24)
GFR calc Af Amer: 7 mL/min — ABNORMAL LOW (ref >60)
GFR calc Af Amer: 14 mL/min — ABNORMAL LOW (ref >60)
GFR calc non Af Amer: 6 mL/min — ABNORMAL LOW (ref >60)
GFR calc non Af Amer: 12 mL/min — ABNORMAL LOW (ref >60)
Glucose, Bld: 91 mg/dL (ref 70–99)
Glucose, Bld: 110 mg/dL — ABNORMAL HIGH (ref 70–99)
Potassium: 3.9 mmol/L (ref 3.5–5.1)
Potassium: 4.1 mmol/L (ref 3.5–5.1)
Sodium: 127 mmol/L — ABNORMAL LOW (ref 135–145)
Sodium: 133 mmol/L — ABNORMAL LOW (ref 135–145)

## 2019-05-30 LAB — GLUCOSE, CAPILLARY
Glucose-Capillary: 96 mg/dL (ref 70–99)
Glucose-Capillary: 98 mg/dL (ref 70–99)
Glucose-Capillary: 171 mg/dL — ABNORMAL HIGH (ref 70–99)
Glucose-Capillary: 79 mg/dL (ref 70–99)

## 2019-05-30 LAB — PROTIME-INR
INR: 2.3 — ABNORMAL HIGH (ref 0.8–1.2)
Prothrombin Time: 24.3 s — ABNORMAL HIGH (ref 11.4–15.2)

## 2019-05-30 LAB — MAGNESIUM: Magnesium: 2.5 mg/dL — ABNORMAL HIGH (ref 1.7–2.4)

## 2019-05-30 LAB — PHOSPHORUS: Phosphorus: 6.1 mg/dL — ABNORMAL HIGH (ref 2.5–4.6)

## 2019-05-30 MED ORDER — OXYCODONE HCL 5 MG PO TABS
ORAL_TABLET | ORAL | Status: AC
  Filled 2019-05-30: qty 3

## 2019-05-30 MED ORDER — WARFARIN SODIUM 2 MG PO TABS
2.0000 mg | ORAL_TABLET | Freq: Once | ORAL | Status: AC
  Administered 2019-05-30: 2 mg via ORAL
  Filled 2019-05-30: qty 1

## 2019-05-30 MED ORDER — POLYETHYLENE GLYCOL 3350 17 G PO PACK
17.0000 g | PACK | Freq: Every day | ORAL | Status: DC
  Administered 2019-05-30 – 2019-05-31 (×2): 17 g via ORAL
  Filled 2019-05-30 (×2): qty 1

## 2019-05-30 NOTE — Progress Notes (Signed)
PT Cancellation Note  Patient Details Name: Max Pittman. MRN: [id number] DOB: [birth year]   Cancelled Treatment:    Reason Eval/Treat Not Completed: Other (comment). Pt reported he was going to eat and rest today. Refused mobility and stated he would do it tomorrow.    [place] 05/30/2019, 1:59 PM [place] Pager [phone] Office [phone]

## 2019-05-30 NOTE — Progress Notes (Signed)
PT Cancellation Note  Patient Details Name: Max Pittman. MRN: [id number] DOB: 12/28/1975   Cancelled Treatment:    Reason Eval/Treat Not Completed: Patient at procedure or test/unavailable. Will try back later.   Shary Decamp [facility] 05/30/2019, 8:35 AM [place] Pager [phone] Office [phone]
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
## 2019-05-30 NOTE — Progress Notes (Signed)
OT Cancellation Note  Patient Details Name: Max Pittman. MRN: [id number] DOB: 1975/06/10   Cancelled Treatment:    Reason Eval/Treat Not Completed: Patient at procedure or test/ unavailable. Will try back next available time  Britt Bottom 05/30/2019, 9:11 AM
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
## 2019-05-30 NOTE — Progress Notes (Signed)
Inpatient Rehabilitation-Admissions Coordinator   Confirmed pt is still interested in CIR. Hopeful that patient can tolerate continued diet advancement. Will follow up with pt tomorrow for possible admit, pending medical readiness and bed availability.   Raechel Ache, OTR/L  Rehab Admissions Coordinator  [phone] 05/30/2019 3:09 PM

## 2019-05-30 NOTE — Progress Notes (Signed)
OT Cancellation Note  Patient Details Name: Max Pittman. MRN: [id number] DOB: [birth year]   Cancelled Treatment:    Reason Eval/Treat Not Completed: Fatigue/lethargy limiting ability to participate. [name]  Britt Bottom 05/30/2019, 2:33 PM

## 2019-05-31 ENCOUNTER — Inpatient Hospital Stay (HOSPITAL_COMMUNITY): Payer: Medicare Other

## 2019-05-31 LAB — PROTIME-INR
INR: 2.2 — ABNORMAL HIGH (ref 0.8–1.2)
Prothrombin Time: 23.9 s — ABNORMAL HIGH (ref 11.4–15.2)

## 2019-05-31 LAB — BASIC METABOLIC PANEL
Anion gap: 16 — ABNORMAL HIGH (ref 5–15)
BUN: 40 mg/dL — ABNORMAL HIGH (ref 6–20)
CO2: 24 mmol/L (ref 22–32)
Calcium: 9 mg/dL (ref 8.9–10.3)
Chloride: 93 mmol/L — ABNORMAL LOW (ref 98–111)
Creatinine, Ser: 6.91 mg/dL — ABNORMAL HIGH (ref 0.61–1.24)
GFR calc Af Amer: 10 mL/min — ABNORMAL LOW (ref >60)
GFR calc non Af Amer: 9 mL/min — ABNORMAL LOW (ref >60)
Glucose, Bld: 115 mg/dL — ABNORMAL HIGH (ref 70–99)
Potassium: 3.6 mmol/L (ref 3.5–5.1)
Sodium: 133 mmol/L — ABNORMAL LOW (ref 135–145)

## 2019-05-31 LAB — CBC
HCT: 29.1 % — ABNORMAL LOW (ref 39.0–52.0)
Hemoglobin: 8.8 g/dL — ABNORMAL LOW (ref 13.0–17.0)
MCH: 29.5 pg (ref 26.0–34.0)
MCHC: 30.2 g/dL (ref 30.0–36.0)
MCV: 97.7 fL (ref 80.0–100.0)
Platelets: 307 10*3/uL (ref 150–400)
RBC: 2.98 MIL/uL — ABNORMAL LOW (ref 4.22–5.81)
RDW: 17.8 % — ABNORMAL HIGH (ref 11.5–15.5)
WBC: 11.3 10*3/uL — ABNORMAL HIGH (ref 4.0–10.5)
nRBC: 0.5 % — ABNORMAL HIGH (ref 0.0–0.2)

## 2019-05-31 LAB — GLUCOSE, CAPILLARY
Glucose-Capillary: 111 mg/dL — ABNORMAL HIGH (ref 70–99)
Glucose-Capillary: 140 mg/dL — ABNORMAL HIGH (ref 70–99)
Glucose-Capillary: 80 mg/dL (ref 70–99)
Glucose-Capillary: 80 mg/dL (ref 70–99)

## 2019-05-31 LAB — PHOSPHORUS: Phosphorus: 4.7 mg/dL — ABNORMAL HIGH (ref 2.5–4.6)

## 2019-05-31 LAB — MAGNESIUM: Magnesium: 2.1 mg/dL (ref 1.7–2.4)

## 2019-05-31 IMAGING — DX DG ABD PORTABLE 1V
2 series · 2 of 2 positions shown · non-contrast
Comparison: [DATE]

CLINICAL DATA: Abdominal pain

EXAM:
PORTABLE ABDOMEN - 1 VIEW

[abdomen kub (1 of 2)]
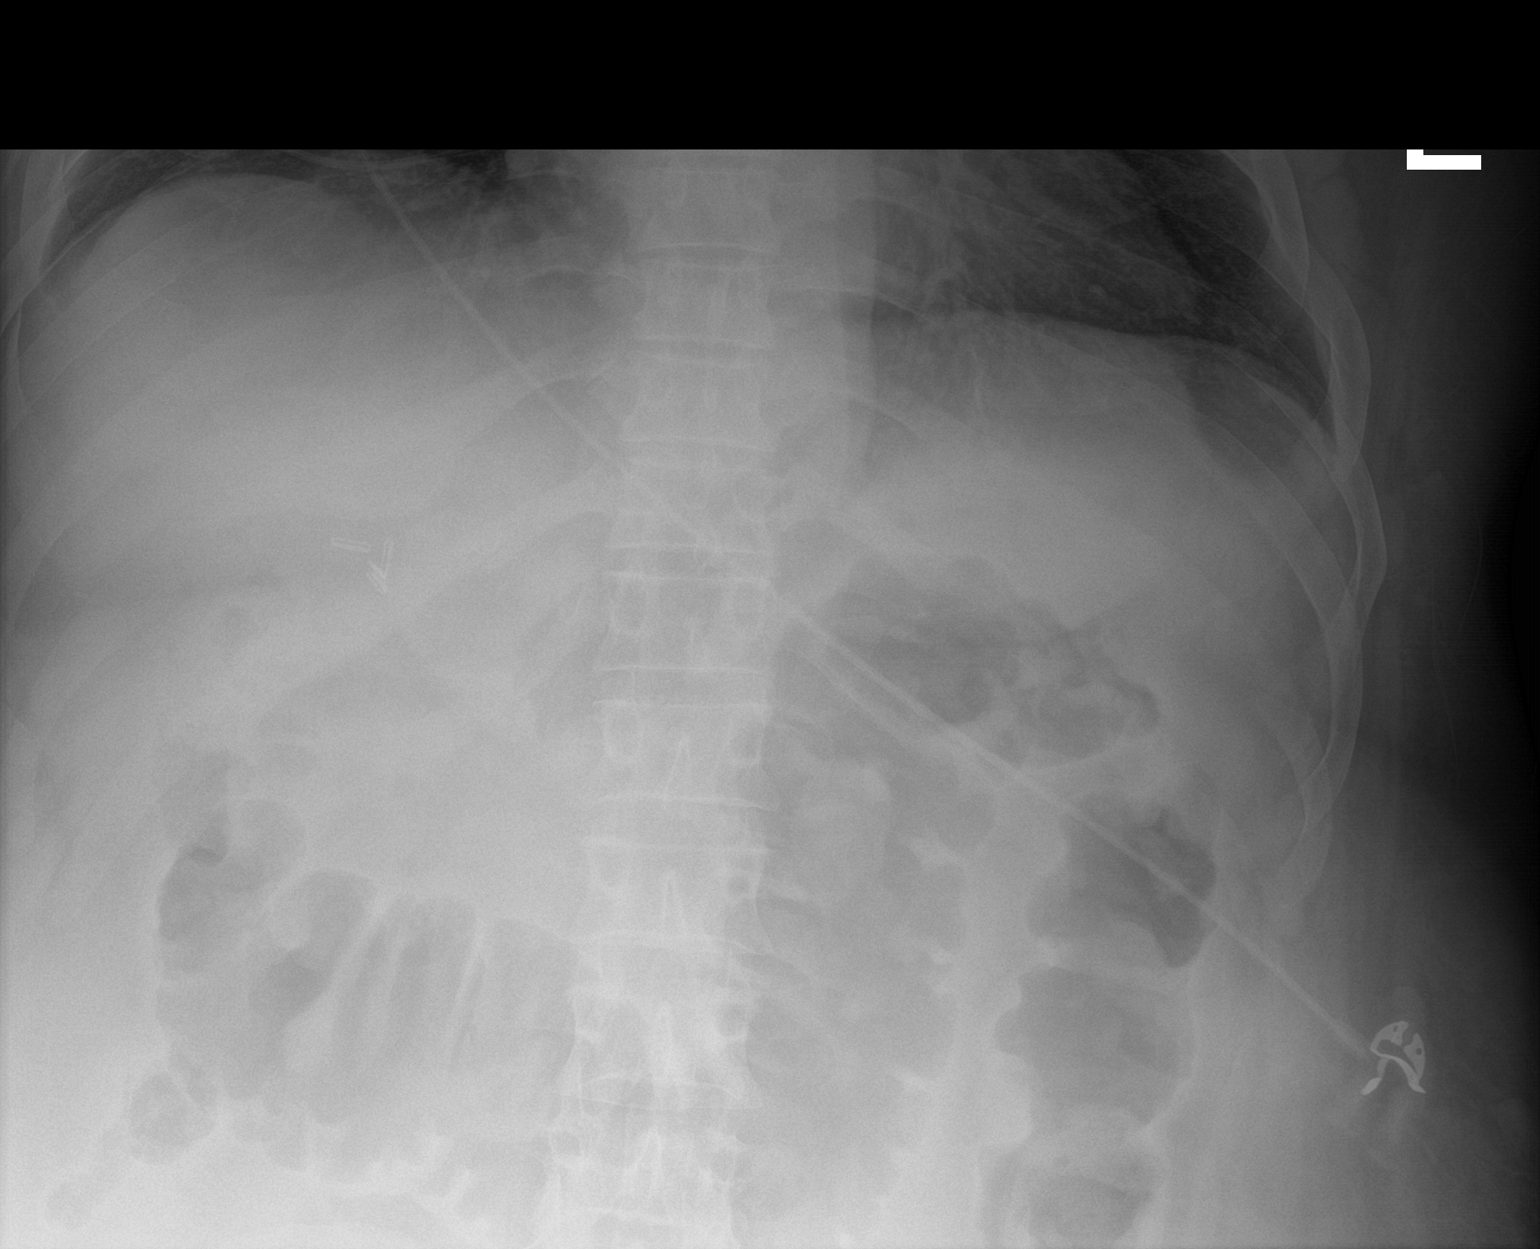

[abdomen kub (2 of 2)]
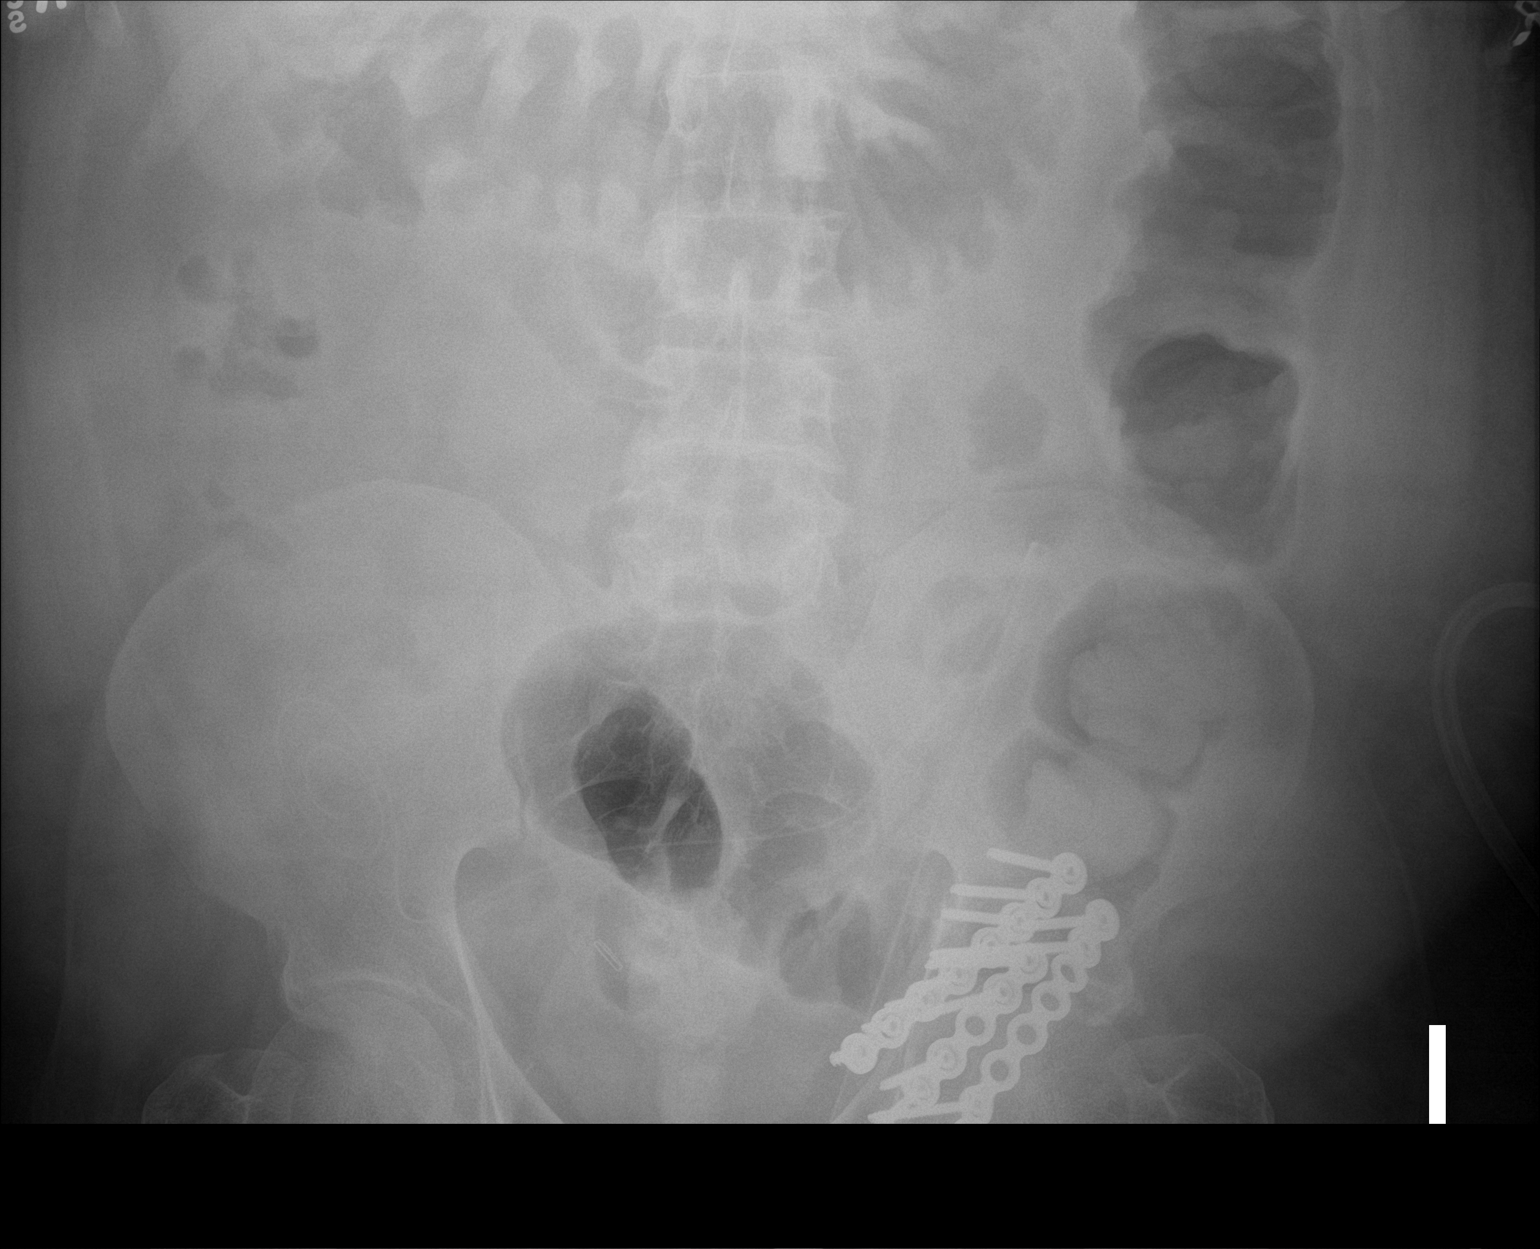

[2 of 2 positions shown; findings below may reference images not displayed]

FINDINGS: Apparent peritoneal dialysis catheter tip in right pelvis laterally.
Moderate stool in colon. No bowel dilatation or air-fluid level to
suggest bowel obstruction. No free air. Postoperative change left
acetabulum. Surgical clips in gallbladder fossa region. Lung bases
clear.
IMPRESSION: Moderate stool in colon. No bowel obstruction or free air. Lung
bases clear. Dialysis catheter tip lateral right pelvis.

## 2019-05-31 MED ORDER — ASPIRIN EC 81 MG PO TBEC
81.0000 mg | DELAYED_RELEASE_TABLET | Freq: Every day | ORAL | Status: DC
  Administered 2019-05-31 – 2019-06-02 (×3): 81 mg via ORAL
  Filled 2019-05-31 (×3): qty 1

## 2019-05-31 MED ORDER — WARFARIN SODIUM 2 MG PO TABS: 2.0000 mg | ORAL_TABLET | Freq: Once | ORAL | Status: DC

## 2019-05-31 MED ORDER — POTASSIUM CHLORIDE CRYS ER 20 MEQ PO TBCR
40.0000 meq | EXTENDED_RELEASE_TABLET | Freq: Once | ORAL | Status: AC
  Administered 2019-05-31: 40 meq via ORAL
  Filled 2019-05-31: qty 2

## 2019-05-31 MED ORDER — FAMOTIDINE 20 MG PO TABS
20.0000 mg | ORAL_TABLET | Freq: Every day | ORAL | Status: DC
  Administered 2019-05-31: 20 mg via ORAL
  Filled 2019-05-31: qty 1

## 2019-05-31 MED ORDER — POLYETHYLENE GLYCOL 3350 17 G PO PACK
17.0000 g | PACK | Freq: Two times a day (BID) | ORAL | Status: DC
  Administered 2019-05-31 – 2019-06-01 (×3): 17 g via ORAL
  Filled 2019-05-31 (×3): qty 1

## 2019-05-31 MED ORDER — MAGNESIUM CITRATE PO SOLN: 0.5000 | Freq: Once | ORAL | Status: DC | PRN

## 2019-05-31 MED ORDER — WARFARIN SODIUM 1 MG PO TABS
1.0000 mg | ORAL_TABLET | Freq: Once | ORAL | Status: AC
  Administered 2019-05-31: 1 mg via ORAL
  Filled 2019-05-31: qty 1
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
## 2019-05-31 NOTE — Progress Notes (Signed)
Inpatient Rehabilitation-Admissions Coordinator   [facility] continues to follow for diet advancement. Noted possible further advancement to soft diet depending on KUB results. Will follow up tomorrow.   Raechel Ache, OTR/L  Rehab Admissions Coordinator  [phone] 05/31/2019 12:18 PM

## 2019-06-01 ENCOUNTER — Ambulatory Visit: Payer: Medicare Other

## 2019-06-01 ENCOUNTER — Telehealth: Payer: Self-pay | Admitting: Emergency Medicine

## 2019-06-01 LAB — RENAL FUNCTION PANEL
Albumin: 2.2 g/dL — ABNORMAL LOW (ref 3.5–5.0)
Anion gap: 16 — ABNORMAL HIGH (ref 5–15)
BUN: 53 mg/dL — ABNORMAL HIGH (ref 6–20)
CO2: 20 mmol/L — ABNORMAL LOW (ref 22–32)
Calcium: 9.1 mg/dL (ref 8.9–10.3)
Chloride: 93 mmol/L — ABNORMAL LOW (ref 98–111)
Creatinine, Ser: 8.62 mg/dL — ABNORMAL HIGH (ref 0.61–1.24)
GFR calc Af Amer: 8 mL/min — ABNORMAL LOW (ref >60)
GFR calc non Af Amer: 7 mL/min — ABNORMAL LOW (ref >60)
Glucose, Bld: 85 mg/dL (ref 70–99)
Phosphorus: 6.5 mg/dL — ABNORMAL HIGH (ref 2.5–4.6)
Potassium: 4.5 mmol/L (ref 3.5–5.1)
Sodium: 129 mmol/L — ABNORMAL LOW (ref 135–145)

## 2019-06-01 LAB — GLUCOSE, CAPILLARY
Glucose-Capillary: 94 mg/dL (ref 70–99)
Glucose-Capillary: 99 mg/dL (ref 70–99)
Glucose-Capillary: 143 mg/dL — ABNORMAL HIGH (ref 70–99)

## 2019-06-01 LAB — BASIC METABOLIC PANEL
Anion gap: 12 (ref 5–15)
BUN: 51 mg/dL — ABNORMAL HIGH (ref 6–20)
CO2: 21 mmol/L — ABNORMAL LOW (ref 22–32)
Calcium: 9.2 mg/dL (ref 8.9–10.3)
Chloride: 96 mmol/L — ABNORMAL LOW (ref 98–111)
Creatinine, Ser: 8.42 mg/dL — ABNORMAL HIGH (ref 0.61–1.24)
GFR calc Af Amer: 8 mL/min — ABNORMAL LOW (ref >60)
GFR calc non Af Amer: 7 mL/min — ABNORMAL LOW (ref >60)
Glucose, Bld: 107 mg/dL — ABNORMAL HIGH (ref 70–99)
Potassium: 4.8 mmol/L (ref 3.5–5.1)
Sodium: 129 mmol/L — ABNORMAL LOW (ref 135–145)

## 2019-06-01 LAB — CBC
HCT: 27.9 % — ABNORMAL LOW (ref 39.0–52.0)
Hemoglobin: 8.4 g/dL — ABNORMAL LOW (ref 13.0–17.0)
MCH: 30 pg (ref 26.0–34.0)
MCHC: 30.1 g/dL (ref 30.0–36.0)
MCV: 99.6 fL (ref 80.0–100.0)
Platelets: 285 10*3/uL (ref 150–400)
RBC: 2.8 MIL/uL — ABNORMAL LOW (ref 4.22–5.81)
RDW: 18.1 % — ABNORMAL HIGH (ref 11.5–15.5)
WBC: 12.8 10*3/uL — ABNORMAL HIGH (ref 4.0–10.5)
nRBC: 0.2 % (ref 0.0–0.2)

## 2019-06-01 LAB — PROTIME-INR
INR: 2.8 — ABNORMAL HIGH (ref 0.8–1.2)
Prothrombin Time: 28.7 s — ABNORMAL HIGH (ref 11.4–15.2)

## 2019-06-01 MED ORDER — PANTOPRAZOLE SODIUM 40 MG PO TBEC
40.0000 mg | DELAYED_RELEASE_TABLET | Freq: Every day | ORAL | Status: DC
  Administered 2019-06-01 – 2019-06-02 (×2): 40 mg via ORAL
  Filled 2019-06-01 (×2): qty 1

## 2019-06-01 MED ORDER — ENSURE ENLIVE PO LIQD
237.0000 mL | Freq: Three times a day (TID) | ORAL | Status: DC
  Administered 2019-06-01 – 2019-06-02 (×2): 237 mL via ORAL

## 2019-06-01 MED ORDER — TRAMADOL HCL 50 MG PO TABS
50.0000 mg | ORAL_TABLET | Freq: Four times a day (QID) | ORAL | Status: DC
  Administered 2019-06-01 – 2019-06-02 (×5): 50 mg via ORAL
  Filled 2019-06-01 (×5): qty 1

## 2019-06-01 MED ORDER — HYDROMORPHONE HCL 1 MG/ML IJ SOLN
INTRAMUSCULAR | Status: AC
  Filled 2019-06-01: qty 0.5

## 2019-06-01 MED ORDER — HEPARIN SODIUM (PORCINE) 1000 UNIT/ML IJ SOLN
INTRAMUSCULAR | Status: AC
  Filled 2019-06-01: qty 1

## 2019-06-01 MED ORDER — METHOCARBAMOL 500 MG PO TABS
1000.0000 mg | ORAL_TABLET | Freq: Three times a day (TID) | ORAL | Status: DC
  Administered 2019-06-01 – 2019-06-02 (×3): 1000 mg via ORAL
  Filled 2019-06-01 (×3): qty 2

## 2019-06-01 NOTE — Progress Notes (Signed)
Patient back to room 4E10 from HD. Vital signs obtained. Alert and oriented to room and call light. Call bell within reach.
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
## 2019-06-01 NOTE — Telephone Encounter (Signed)
Patient in patient. Monitor will be set up on discharge from hospital.
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
## 2019-06-01 NOTE — Progress Notes (Signed)
Inpatient Rehabilitation-Admissions Coordinator   I do not have a bed available for this patient today. Will continue to follow for possible admit, pending bed availability.   Raechel Ache, OTR/L  Rehab Admissions Coordinator  [phone] 06/01/2019 12:33 PM

## 2019-06-02 ENCOUNTER — Encounter: Payer: Self-pay | Admitting: Cardiovascular Disease

## 2019-06-02 ENCOUNTER — Inpatient Hospital Stay (HOSPITAL_COMMUNITY)
Admission: RE | Admit: 2019-06-02 | Discharge: 2019-06-17 | DRG: 091 | Disposition: A | Discharge status: Home / Self Care | Payer: Medicare Other | Source: Intra-hospital | Attending: Physical Medicine & Rehabilitation | Admitting: Physical Medicine & Rehabilitation

## 2019-06-02 ENCOUNTER — Other Ambulatory Visit: Payer: Self-pay

## 2019-06-02 DIAGNOSIS — K567 Ileus, unspecified: Secondary | ICD-10-CM | POA: Diagnosis present

## 2019-06-02 DIAGNOSIS — Z885 Allergy status to narcotic agent status: Secondary | ICD-10-CM | POA: Diagnosis not present

## 2019-06-02 DIAGNOSIS — E1142 Type 2 diabetes mellitus with diabetic polyneuropathy: Secondary | ICD-10-CM | POA: Diagnosis present

## 2019-06-02 DIAGNOSIS — I63412 Cerebral infarction due to embolism of left middle cerebral artery: Secondary | ICD-10-CM

## 2019-06-02 DIAGNOSIS — I639 Cerebral infarction, unspecified: Secondary | ICD-10-CM | POA: Diagnosis present

## 2019-06-02 DIAGNOSIS — Z992 Dependence on renal dialysis: Secondary | ICD-10-CM

## 2019-06-02 DIAGNOSIS — Z881 Allergy status to other antibiotic agents status: Secondary | ICD-10-CM

## 2019-06-02 DIAGNOSIS — Y832 Surgical operation with anastomosis, bypass or graft as the cause of abnormal reaction of the patient, or of later complication, without mention of misadventure at the time of the procedure: Secondary | ICD-10-CM | POA: Diagnosis present

## 2019-06-02 DIAGNOSIS — E1151 Type 2 diabetes mellitus with diabetic peripheral angiopathy without gangrene: Secondary | ICD-10-CM | POA: Diagnosis present

## 2019-06-02 DIAGNOSIS — S32462S Displaced associated transverse-posterior fracture of left acetabulum, sequela: Secondary | ICD-10-CM | POA: Diagnosis not present

## 2019-06-02 DIAGNOSIS — Z87891 Personal history of nicotine dependence: Secondary | ICD-10-CM | POA: Diagnosis not present

## 2019-06-02 DIAGNOSIS — I82442 Acute embolism and thrombosis of left tibial vein: Secondary | ICD-10-CM | POA: Diagnosis not present

## 2019-06-02 DIAGNOSIS — S2243XD Multiple fractures of ribs, bilateral, subsequent encounter for fracture with routine healing: Secondary | ICD-10-CM | POA: Diagnosis not present

## 2019-06-02 DIAGNOSIS — S32810A Multiple fractures of pelvis with stable disruption of pelvic ring, initial encounter for closed fracture: Secondary | ICD-10-CM | POA: Diagnosis not present

## 2019-06-02 DIAGNOSIS — I120 Hypertensive chronic kidney disease with stage 5 chronic kidney disease or end stage renal disease: Secondary | ICD-10-CM | POA: Diagnosis present

## 2019-06-02 DIAGNOSIS — R2689 Other abnormalities of gait and mobility: Principal | ICD-10-CM | POA: Diagnosis present

## 2019-06-02 DIAGNOSIS — N186 End stage renal disease: Secondary | ICD-10-CM | POA: Diagnosis present

## 2019-06-02 DIAGNOSIS — D631 Anemia in chronic kidney disease: Secondary | ICD-10-CM | POA: Diagnosis present

## 2019-06-02 DIAGNOSIS — I609 Nontraumatic subarachnoid hemorrhage, unspecified: Secondary | ICD-10-CM

## 2019-06-02 DIAGNOSIS — Z716 Tobacco abuse counseling: Secondary | ICD-10-CM

## 2019-06-02 DIAGNOSIS — T82818A Embolism of vascular prosthetic devices, implants and grafts, initial encounter: Secondary | ICD-10-CM | POA: Diagnosis present

## 2019-06-02 DIAGNOSIS — Z6841 Body Mass Index (BMI) 40.0 and over, adult: Secondary | ICD-10-CM

## 2019-06-02 DIAGNOSIS — Z91040 Latex allergy status: Secondary | ICD-10-CM | POA: Diagnosis not present

## 2019-06-02 DIAGNOSIS — E1122 Type 2 diabetes mellitus with diabetic chronic kidney disease: Secondary | ICD-10-CM | POA: Diagnosis present

## 2019-06-02 DIAGNOSIS — T07XXXA Unspecified multiple injuries, initial encounter: Secondary | ICD-10-CM

## 2019-06-02 DIAGNOSIS — S2243XA Multiple fractures of ribs, bilateral, initial encounter for closed fracture: Secondary | ICD-10-CM

## 2019-06-02 DIAGNOSIS — Z419 Encounter for procedure for purposes other than remedying health state, unspecified: Secondary | ICD-10-CM | POA: Diagnosis not present

## 2019-06-02 DIAGNOSIS — S32422D Displaced fracture of posterior wall of left acetabulum, subsequent encounter for fracture with routine healing: Secondary | ICD-10-CM | POA: Diagnosis not present

## 2019-06-02 DIAGNOSIS — S73015D Posterior dislocation of left hip, subsequent encounter: Secondary | ICD-10-CM | POA: Diagnosis not present

## 2019-06-02 DIAGNOSIS — G4700 Insomnia, unspecified: Secondary | ICD-10-CM | POA: Diagnosis present

## 2019-06-02 DIAGNOSIS — M7989 Other specified soft tissue disorders: Secondary | ICD-10-CM | POA: Diagnosis not present

## 2019-06-02 DIAGNOSIS — D62 Acute posthemorrhagic anemia: Secondary | ICD-10-CM | POA: Diagnosis present

## 2019-06-02 DIAGNOSIS — I10 Essential (primary) hypertension: Secondary | ICD-10-CM

## 2019-06-02 DIAGNOSIS — Z79899 Other long term (current) drug therapy: Secondary | ICD-10-CM

## 2019-06-02 DIAGNOSIS — I69322 Dysarthria following cerebral infarction: Secondary | ICD-10-CM | POA: Diagnosis not present

## 2019-06-02 DIAGNOSIS — Z79891 Long term (current) use of opiate analgesic: Secondary | ICD-10-CM

## 2019-06-02 LAB — GLUCOSE, CAPILLARY
Glucose-Capillary: 92 mg/dL (ref 70–99)
Glucose-Capillary: 89 mg/dL (ref 70–99)
Glucose-Capillary: 79 mg/dL (ref 70–99)
Glucose-Capillary: 111 mg/dL — ABNORMAL HIGH (ref 70–99)
Glucose-Capillary: 112 mg/dL — ABNORMAL HIGH (ref 70–99)

## 2019-06-02 LAB — CULTURE, BLOOD (ROUTINE X 2)
Culture: NO GROWTH
Culture: NO GROWTH
Special Requests: ADEQUATE

## 2019-06-02 LAB — PROTIME-INR
INR: 1.8 — ABNORMAL HIGH (ref 0.8–1.2)
Prothrombin Time: 20.6 s — ABNORMAL HIGH (ref 11.4–15.2)

## 2019-06-02 MED ORDER — SENNOSIDES-DOCUSATE SODIUM 8.6-50 MG PO TABS
1.0000 | ORAL_TABLET | Freq: Two times a day (BID) | ORAL | Status: DC
  Administered 2019-06-02 – 2019-06-16 (×20): 1 via ORAL
  Filled 2019-06-02 (×28): qty 1

## 2019-06-02 MED ORDER — WARFARIN SODIUM 2 MG PO TABS
2.0000 mg | ORAL_TABLET | Freq: Once | ORAL | Status: AC
  Administered 2019-06-02: 18:00:00 2 mg via ORAL
  Filled 2019-06-02: qty 1

## 2019-06-02 MED ORDER — SORBITOL 70 % SOLN
30.0000 mL | Freq: Every day | Status: DC | PRN
  Administered 2019-06-15 – 2019-06-16 (×2): 30 mL via ORAL
  Filled 2019-06-02 (×2): qty 30

## 2019-06-02 MED ORDER — CARVEDILOL 12.5 MG PO TABS
12.5000 mg | ORAL_TABLET | Freq: Two times a day (BID) | ORAL | Status: DC
  Administered 2019-06-02 – 2019-06-12 (×19): 12.5 mg via ORAL
  Filled 2019-06-02 (×19): qty 1

## 2019-06-02 MED ORDER — POLYETHYLENE GLYCOL 3350 17 G PO PACK
17.0000 g | PACK | Freq: Every day | ORAL | Status: DC
  Administered 2019-06-02: 17 g via ORAL
  Filled 2019-06-02: qty 1

## 2019-06-02 MED ORDER — OXYCODONE HCL 5 MG PO TABS
10.0000 mg | ORAL_TABLET | ORAL | Status: DC | PRN
  Administered 2019-06-03 – 2019-06-05 (×9): 15 mg via ORAL
  Administered 2019-06-06: 10 mg via ORAL
  Administered 2019-06-06 – 2019-06-10 (×5): 15 mg via ORAL
  Administered 2019-06-10 – 2019-06-11 (×2): 10 mg via ORAL
  Administered 2019-06-11: 15 mg via ORAL
  Administered 2019-06-11: 10 mg via ORAL
  Administered 2019-06-12 – 2019-06-15 (×14): 15 mg via ORAL
  Administered 2019-06-16: 10 mg via ORAL
  Administered 2019-06-16 – 2019-06-17 (×4): 15 mg via ORAL
  Filled 2019-06-02 (×7): qty 3
  Filled 2019-06-02: qty 2
  Filled 2019-06-02 (×2): qty 3
  Filled 2019-06-02: qty 2
  Filled 2019-06-02: qty 3
  Filled 2019-06-02: qty 2
  Filled 2019-06-02 (×3): qty 3
  Filled 2019-06-02: qty 2
  Filled 2019-06-02 (×5): qty 3
  Filled 2019-06-02 (×2): qty 2
  Filled 2019-06-02 (×17): qty 3

## 2019-06-02 MED ORDER — PREGABALIN 50 MG PO CAPS
50.0000 mg | ORAL_CAPSULE | Freq: Every day | ORAL | Status: DC
  Administered 2019-06-02 – 2019-06-16 (×15): 50 mg via ORAL
  Filled 2019-06-02 (×15): qty 1

## 2019-06-02 MED ORDER — POLYETHYLENE GLYCOL 3350 17 G PO PACK
17.0000 g | PACK | Freq: Every day | ORAL | Status: DC
  Administered 2019-06-03 – 2019-06-16 (×6): 17 g via ORAL
  Filled 2019-06-02 (×15): qty 1

## 2019-06-02 MED ORDER — ENSURE ENLIVE PO LIQD
237.0000 mL | Freq: Three times a day (TID) | ORAL | Status: DC
  Administered 2019-06-02 – 2019-06-10 (×20): 237 mL via ORAL

## 2019-06-02 MED ORDER — TRAMADOL HCL 50 MG PO TABS
50.0000 mg | ORAL_TABLET | Freq: Four times a day (QID) | ORAL | Status: DC
  Administered 2019-06-02 – 2019-06-07 (×19): 50 mg via ORAL
  Filled 2019-06-02 (×19): qty 1

## 2019-06-02 MED ORDER — WARFARIN SODIUM 2 MG PO TABS: 2.0000 mg | ORAL_TABLET | Freq: Once | ORAL | Status: DC

## 2019-06-02 MED ORDER — SUCRALFATE 1 GM/10ML PO SUSP
1.0000 g | Freq: Three times a day (TID) | ORAL | Status: DC
  Administered 2019-06-02: 1 g via ORAL
  Filled 2019-06-02: qty 10

## 2019-06-02 MED ORDER — METHOCARBAMOL 500 MG PO TABS
1000.0000 mg | ORAL_TABLET | Freq: Three times a day (TID) | ORAL | Status: DC
  Administered 2019-06-02 – 2019-06-17 (×41): 1000 mg via ORAL
  Filled 2019-06-02 (×41): qty 2

## 2019-06-02 MED ORDER — INSULIN ASPART 100 UNIT/ML ~~LOC~~ SOLN
0.0000 [IU] | Freq: Three times a day (TID) | SUBCUTANEOUS | Status: DC
  Administered 2019-06-03 (×3): 2 [IU] via SUBCUTANEOUS
  Administered 2019-06-04: 3 [IU] via SUBCUTANEOUS
  Administered 2019-06-04: 18:00:00 2 [IU] via SUBCUTANEOUS
  Administered 2019-06-05: 3 [IU] via SUBCUTANEOUS
  Administered 2019-06-06 – 2019-06-07 (×2): 1 [IU] via SUBCUTANEOUS
  Administered 2019-06-08: 2 [IU] via SUBCUTANEOUS
  Administered 2019-06-08: 3 [IU] via SUBCUTANEOUS
  Administered 2019-06-09: 2 [IU] via SUBCUTANEOUS
  Administered 2019-06-09: 3 [IU] via SUBCUTANEOUS
  Administered 2019-06-10 – 2019-06-11 (×3): 2 [IU] via SUBCUTANEOUS
  Administered 2019-06-12 (×2): 3 [IU] via SUBCUTANEOUS
  Administered 2019-06-13 – 2019-06-15 (×3): 2 [IU] via SUBCUTANEOUS

## 2019-06-02 MED ORDER — BUPROPION HCL ER (SR) 150 MG PO TB12
150.0000 mg | ORAL_TABLET | Freq: Two times a day (BID) | ORAL | Status: DC
  Administered 2019-06-02 – 2019-06-17 (×30): 150 mg via ORAL
  Filled 2019-06-02 (×32): qty 1

## 2019-06-02 MED ORDER — ONDANSETRON HCL 4 MG PO TABS
4.0000 mg | ORAL_TABLET | Freq: Four times a day (QID) | ORAL | Status: DC | PRN
  Administered 2019-06-02 – 2019-06-13 (×5): 4 mg via ORAL
  Filled 2019-06-02 (×5): qty 1

## 2019-06-02 MED ORDER — SEVELAMER CARBONATE 800 MG PO TABS
2400.0000 mg | ORAL_TABLET | Freq: Three times a day (TID) | ORAL | Status: DC
  Administered 2019-06-02 – 2019-06-17 (×43): 2400 mg via ORAL
  Filled 2019-06-02 (×44): qty 3

## 2019-06-02 MED ORDER — ONDANSETRON HCL 4 MG/2ML IJ SOLN
4.0000 mg | Freq: Four times a day (QID) | INTRAMUSCULAR | Status: DC | PRN
  Administered 2019-06-12: 4 mg via INTRAVENOUS
  Filled 2019-06-02 (×2): qty 2

## 2019-06-02 MED ORDER — WARFARIN - PHARMACIST DOSING INPATIENT: Freq: Every day | Status: DC

## 2019-06-02 MED ORDER — PANTOPRAZOLE SODIUM 40 MG PO TBEC
40.0000 mg | DELAYED_RELEASE_TABLET | Freq: Every day | ORAL | Status: DC
  Administered 2019-06-03 – 2019-06-17 (×15): 40 mg via ORAL
  Filled 2019-06-02 (×15): qty 1

## 2019-06-02 MED ORDER — ASPIRIN EC 81 MG PO TBEC
81.0000 mg | DELAYED_RELEASE_TABLET | Freq: Every day | ORAL | Status: DC
  Administered 2019-06-03 – 2019-06-17 (×15): 81 mg via ORAL
  Filled 2019-06-02 (×15): qty 1

## 2019-06-02 MED ORDER — VITAMIN D 25 MCG (1000 UNIT) PO TABS
2000.0000 [IU] | ORAL_TABLET | Freq: Two times a day (BID) | ORAL | Status: DC
  Administered 2019-06-02 – 2019-06-17 (×30): 2000 [IU] via ORAL
  Filled 2019-06-02 (×30): qty 2

## 2019-06-02 MED ORDER — LIDOCAINE 5 % EX PTCH
1.0000 | MEDICATED_PATCH | CUTANEOUS | Status: DC
  Administered 2019-06-03 – 2019-06-14 (×3): 1 via TRANSDERMAL
  Filled 2019-06-02 (×10): qty 1

## 2019-06-02 NOTE — TOC Transition Note (Signed)
Transition of Care [facility]) - CM/SW Discharge Note   Patient Details  Name: Dervin Vore. MRN: [id number] Date of Birth: November 07, 1975  [facility] CM/SW Contact:  Ella Bodo, RN Phone Number: 06/02/2019, 12:22 PM   Clinical Narrative:   Pt medically stable and has been accepted for admission to [facility] IP Rehab.  Plan discharge to CIR when bed ready.      Final next level of care: IP Rehab Facility Barriers to Discharge: Barriers Resolved   Patient Goals and CMS Choice   CMS Medicare.gov Compare Post Acute Care list provided to:: Patient Choice offered to / list presented to : Patient  Discharge Placement                       Discharge Plan and Services     Post Acute Care Choice: IP Rehab                               Social Determinants of Health (SDOH) Interventions     Readmission Risk Interventions No flowsheet data found.  Reinaldo Raddle, RN, BSN  Trauma/Neuro ICU Case Manager [phone]
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
## 2019-06-02 NOTE — Progress Notes (Signed)
Patient arrived alert and oriented x 4 with some clothes and his cell phone. Policy and procedures were reviewed with patient. No c/o pain at this time.
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
## 2019-06-02 NOTE — Progress Notes (Signed)
Inpatient Rehabilitation-Admissions Coordinator   I have received medical clearance from attending service for admit to CIR today. Pt notified of bed offer and has accepted. Reviewed insurance benefit letter and consent forms. All questions answered. RN and [facility] team aware of plan for admit today.   Please call if questions.   Raechel Ache, OTR/L  Rehab Admissions Coordinator  [phone] 06/02/2019 11:04 AM
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
## 2019-06-02 NOTE — Progress Notes (Signed)
Pt transferred to inpatient rehab today.  Report given to Vernie Murders RN.  Pt's IV removed.  Pt taken off telemetry and CCMD notified.  Pt left with all of their personal belongings.

## 2019-06-02 NOTE — Progress Notes (Signed)
Patient has not voided since midnight and that it was the first time he's voided since he's been here per patient. He states he urinates every few weeks and is refusing in/out caths. Patient was bladder scanned for 117. Notified on-call provider, Dr. Naaman Plummer.
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
## 2019-06-02 NOTE — Progress Notes (Signed)
Renal Navigator spoke with CIR AC/K. Colin Rhein regarding patient's admission to CIR today. Navigator provided update to patient's OP HD clinic/[name] to provide continuity of care. They would like to be updated in about a week in order to give patient an exact seat time on his MWF schedule. Navigator agrees. Patient also discussed with Dr. Coladonato/Nephrologist today to confirm that patient will have HD tx tomorrow and then get back on MWF schedule on Monday in order for CIR to plan his therapy schedule accordingly.   Alphonzo Cruise, [place] Renal Navigator [phone]
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
## 2019-06-02 NOTE — Plan of Care (Signed)
  Problem: Consults Goal: RH STROKE PATIENT EDUCATION Description: See Patient Education module for education specifics  Outcome: Progressing Goal: Nutrition Consult-if indicated Outcome: Progressing Goal: Diabetes Guidelines if Diabetic/Glucose > 140 Description: If diabetic or lab glucose is > 140 mg/dl - Initiate Diabetes/Hyperglycemia Guidelines & Document Interventions  Outcome: Progressing   Problem: RH BOWEL ELIMINATION Goal: RH STG MANAGE BOWEL WITH ASSISTANCE Description: STG Manage Bowel with Moderate Assistance. Outcome: Progressing   Problem: RH BLADDER ELIMINATION Goal: RH STG MANAGE BLADDER WITH ASSISTANCE Description: STG Manage Bladder With Moderate Assistance Outcome: Progressing   Problem: RH SKIN INTEGRITY Goal: RH STG SKIN FREE OF INFECTION/BREAKDOWN Description: Patient will verbalize ways to prevent skin breakdown by discharge. Outcome: Progressing Goal: RH STG MAINTAIN SKIN INTEGRITY WITH ASSISTANCE Description: STG Maintain Skin Integrity With Moderate Assistance. Outcome: Progressing Goal: RH STG ABLE TO PERFORM INCISION/WOUND CARE W/ASSISTANCE Description: STG Able To Perform Incision/Wound Care With Moderate Assistance. Outcome: Progressing   Problem: RH SAFETY Goal: RH STG ADHERE TO SAFETY PRECAUTIONS W/ASSISTANCE/DEVICE Description: STG Adhere to Safety Precautions With Moderate Assistance/Device. Outcome: Progressing   Problem: RH PAIN MANAGEMENT Goal: RH STG PAIN MANAGED AT OR BELOW PT'S PAIN GOAL Outcome: Progressing   Problem: RH KNOWLEDGE DEFICIT Goal: RH STG INCREASE KNOWLEDGE OF DIABETES Description: Patient will verbalize ways to control and lower blood sugar while in the hospital. Outcome: Progressing Goal: RH STG INCREASE KNOWLEDGE OF HYPERTENSION Description: Patient will verbalize ways to prevent high blood pressure in his daily life. Outcome: Progressing Goal: RH STG INCREASE KNOWLEDGE OF DYSPHAGIA/FLUID INTAKE Description:  Patient will increase his food intake and remain free from aspiration while in the hospital. Outcome: Progressing Goal: RH STG INCREASE KNOWLEGDE OF HYPERLIPIDEMIA Description: Patient will verbalize ways to manage high cholesterol levels by discharge. Outcome: Progressing Goal: RH STG INCREASE KNOWLEDGE OF STROKE PROPHYLAXIS Description: Patient will verbalize signs and symptoms of stroke by discharge.  Outcome: Progressing

## 2019-06-02 NOTE — Progress Notes (Addendum)
Plan of care reviewed. Pt's hemodynamically stable. Appeared alert and oriented x 3. He had some confusion and pulled dressing on his perotoneal catheter. He had no idea why and when he did that.  Sinus rhythm on monitor, HR 80s, BP 118/81 - 127/ 70 mmHg, SPO2 98% on room air. Remained afebrile.  Wound vac had no drainage in canister. Seal leaked, Pt partially pulled the adge of wound vac dressing.  Dressing reinforced with Tegaderm. Will discuss with MD and care team at am for wound care plan.  His pain tolerated well, alternated with Lyrica, Tylenol, Robaxin, Oxycodone and Tramadol. Pt able to ambulate to bath room with walker and one assistance.   No acute distress noted tonight. Will continue to monitor.  Kennyth Lose, RN, PCCN,CMC,CSC
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
## 2019-06-03 ENCOUNTER — Inpatient Hospital Stay (HOSPITAL_COMMUNITY): Payer: Medicare Other | Admitting: Occupational Therapy

## 2019-06-03 ENCOUNTER — Encounter (HOSPITAL_COMMUNITY): Payer: Medicare Other

## 2019-06-03 ENCOUNTER — Inpatient Hospital Stay (HOSPITAL_COMMUNITY): Payer: Medicare Other | Admitting: Speech Pathology

## 2019-06-03 ENCOUNTER — Encounter (HOSPITAL_COMMUNITY): Payer: Self-pay | Admitting: Physical Medicine & Rehabilitation

## 2019-06-03 ENCOUNTER — Inpatient Hospital Stay (HOSPITAL_COMMUNITY): Payer: Medicare Other

## 2019-06-03 DIAGNOSIS — I639 Cerebral infarction, unspecified: Secondary | ICD-10-CM | POA: Diagnosis not present

## 2019-06-03 DIAGNOSIS — S32462S Displaced associated transverse-posterior fracture of left acetabulum, sequela: Secondary | ICD-10-CM

## 2019-06-03 LAB — RENAL FUNCTION PANEL
Albumin: 2.5 g/dL — ABNORMAL LOW (ref 3.5–5.0)
Anion gap: 14 (ref 5–15)
BUN: 20 mg/dL (ref 6–20)
CO2: 24 mmol/L (ref 22–32)
Calcium: 8.6 mg/dL — ABNORMAL LOW (ref 8.9–10.3)
Chloride: 94 mmol/L — ABNORMAL LOW (ref 98–111)
Creatinine, Ser: 4.79 mg/dL — ABNORMAL HIGH (ref 0.61–1.24)
GFR calc Af Amer: 16 mL/min — ABNORMAL LOW (ref >60)
GFR calc non Af Amer: 14 mL/min — ABNORMAL LOW (ref >60)
Glucose, Bld: 193 mg/dL — ABNORMAL HIGH (ref 70–99)
Phosphorus: 2.9 mg/dL (ref 2.5–4.6)
Potassium: 4.2 mmol/L (ref 3.5–5.1)
Sodium: 132 mmol/L — ABNORMAL LOW (ref 135–145)

## 2019-06-03 LAB — PROTIME-INR
INR: 1.6 — ABNORMAL HIGH (ref 0.8–1.2)
Prothrombin Time: 18.3 seconds — ABNORMAL HIGH (ref 11.4–15.2)

## 2019-06-03 LAB — GLUCOSE, CAPILLARY
Glucose-Capillary: 126 mg/dL — ABNORMAL HIGH (ref 70–99)
Glucose-Capillary: 146 mg/dL — ABNORMAL HIGH (ref 70–99)
Glucose-Capillary: 126 mg/dL — ABNORMAL HIGH (ref 70–99)
Glucose-Capillary: 113 mg/dL — ABNORMAL HIGH (ref 70–99)

## 2019-06-03 LAB — CBC
HCT: 32.9 % — ABNORMAL LOW (ref 39.0–52.0)
Hemoglobin: 10 g/dL — ABNORMAL LOW (ref 13.0–17.0)
MCH: 30.1 pg (ref 26.0–34.0)
MCHC: 30.4 g/dL (ref 30.0–36.0)
MCV: 99.1 fL (ref 80.0–100.0)
Platelets: 374 10*3/uL (ref 150–400)
RBC: 3.32 MIL/uL — ABNORMAL LOW (ref 4.22–5.81)
RDW: 19.2 % — ABNORMAL HIGH (ref 11.5–15.5)
WBC: 12.8 10*3/uL — ABNORMAL HIGH (ref 4.0–10.5)
nRBC: 0 % (ref 0.0–0.2)

## 2019-06-03 MED ORDER — CHLORHEXIDINE GLUCONATE CLOTH 2 % EX PADS
6.0000 | MEDICATED_PAD | Freq: Every day | CUTANEOUS | Status: DC
  Administered 2019-06-03 – 2019-06-16 (×9): 6 via TOPICAL

## 2019-06-03 MED ORDER — HEPARIN SODIUM (PORCINE) 1000 UNIT/ML IJ SOLN
INTRAMUSCULAR | Status: AC
  Administered 2019-06-03: 3200 [IU]
  Filled 2019-06-03: qty 4

## 2019-06-03 MED ORDER — WARFARIN SODIUM 2 MG PO TABS: 2.0000 mg | ORAL_TABLET | Freq: Once | ORAL | Status: DC

## 2019-06-03 MED ORDER — OXYCODONE HCL 5 MG PO TABS
ORAL_TABLET | ORAL | Status: AC
  Filled 2019-06-03: qty 3

## 2019-06-03 NOTE — Plan of Care (Signed)
  Problem: Sit to Stand Goal: LTG:  Patient will perform sit to stand in prep for activites of daily living with assistance level (OT) Description: LTG:  Patient will perform sit to stand in prep for activites of daily living with assistance level (OT) Flowsheets (Taken 06/03/2019 1555) LTG: PT will perform sit to stand in prep for activites of daily living with assistance level: Supervision/Verbal cueing   Problem: RH Bathing Goal: LTG Patient will bathe all body parts with assist levels (OT) Description: LTG: Patient will bathe all body parts with assist levels (OT) Flowsheets (Taken 06/03/2019 1555) LTG: Pt will perform bathing with assistance level/cueing: Minimal Assistance - Patient > 75%   Problem: RH Dressing Goal: LTG Patient will perform upper body dressing (OT) Description: LTG Patient will perform upper body dressing with assist, with/without cues (OT). Flowsheets (Taken 06/03/2019 1555) LTG: Pt will perform upper body dressing with assistance level of: Set up assist Goal: LTG Patient will perform lower body dressing w/assist (OT) Description: LTG: Patient will perform lower body dressing with assist, with/without cues in positioning using equipment (OT) Flowsheets (Taken 06/03/2019 1555) LTG: Pt will perform lower body dressing with assistance level of: Minimal Assistance - Patient > 75%   Problem: RH Toileting Goal: LTG Patient will perform toileting task (3/3 steps) with assistance level (OT) Description: LTG: Patient will perform toileting task (3/3 steps) with assistance level (OT)  Flowsheets (Taken 06/03/2019 1555) LTG: Pt will perform toileting task (3/3 steps) with assistance level: Minimal Assistance - Patient > 75%   Problem: RH Toilet Transfers Goal: LTG Patient will perform toilet transfers w/assist (OT) Description: LTG: Patient will perform toilet transfers with assist, with/without cues using equipment (OT) Flowsheets (Taken 06/03/2019 1555) LTG: Pt will perform toilet  transfers with assistance level of: Supervision/Verbal cueing

## 2019-06-03 NOTE — Plan of Care (Signed)
  Problem: Consults Goal: RH STROKE PATIENT EDUCATION Description: See Patient Education module for education specifics  Outcome: Progressing   Problem: RH BOWEL ELIMINATION Goal: RH STG MANAGE BOWEL WITH ASSISTANCE Description: STG Manage Bowel with Moderate Assistance. Outcome: Progressing   Problem: RH BLADDER ELIMINATION Goal: RH STG MANAGE BLADDER WITH ASSISTANCE Description: STG Manage Bladder With Moderate Assistance Outcome: Progressing

## 2019-06-03 NOTE — Plan of Care (Signed)
  Problem: Consults Goal: RH STROKE PATIENT EDUCATION Description: See Patient Education module for education specifics  Outcome: Progressing Goal: Nutrition Consult-if indicated Outcome: Progressing Goal: Diabetes Guidelines if Diabetic/Glucose > 140 Description: If diabetic or lab glucose is > 140 mg/dl - Initiate Diabetes/Hyperglycemia Guidelines & Document Interventions  Outcome: Progressing   Problem: RH BOWEL ELIMINATION Goal: RH STG MANAGE BOWEL WITH ASSISTANCE Description: STG Manage Bowel with Moderate Assistance. Outcome: Progressing   Problem: RH BLADDER ELIMINATION Goal: RH STG MANAGE BLADDER WITH ASSISTANCE Description: STG Manage Bladder With Moderate Assistance Outcome: Progressing   Problem: RH SKIN INTEGRITY Goal: RH STG SKIN FREE OF INFECTION/BREAKDOWN Description: Patient will verbalize ways to prevent skin breakdown by discharge. Outcome: Progressing Goal: RH STG MAINTAIN SKIN INTEGRITY WITH ASSISTANCE Description: STG Maintain Skin Integrity With Moderate Assistance. Outcome: Progressing Goal: RH STG ABLE TO PERFORM INCISION/WOUND CARE W/ASSISTANCE Description: STG Able To Perform Incision/Wound Care With Moderate Assistance. Outcome: Progressing   Problem: RH SAFETY Goal: RH STG ADHERE TO SAFETY PRECAUTIONS W/ASSISTANCE/DEVICE Description: STG Adhere to Safety Precautions With Moderate Assistance/Device. Outcome: Progressing   Problem: RH PAIN MANAGEMENT Goal: RH STG PAIN MANAGED AT OR BELOW PT'S PAIN GOAL Outcome: Progressing   Problem: RH KNOWLEDGE DEFICIT Goal: RH STG INCREASE KNOWLEDGE OF DIABETES Description: Patient will verbalize ways to control and lower blood sugar while in the hospital. Outcome: Progressing Goal: RH STG INCREASE KNOWLEDGE OF HYPERTENSION Description: Patient will verbalize ways to prevent high blood pressure in his daily life. Outcome: Progressing Goal: RH STG INCREASE KNOWLEDGE OF DYSPHAGIA/FLUID INTAKE Description:  Patient will increase his food intake and remain free from aspiration while in the hospital. Outcome: Progressing Goal: RH STG INCREASE KNOWLEGDE OF HYPERLIPIDEMIA Description: Patient will verbalize ways to manage high cholesterol levels by discharge. Outcome: Progressing Goal: RH STG INCREASE KNOWLEDGE OF STROKE PROPHYLAXIS Description: Patient will verbalize signs and symptoms of stroke by discharge.  Outcome: Progressing
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
## 2019-06-03 NOTE — Progress Notes (Signed)
HD treatment completed with no issue noted. Blood collected CBC and Renal but lab never processes it. multiple calls placed to the lab for result but never get it. We continue to monitor

## 2019-06-03 NOTE — Progress Notes (Signed)
Patient noted to turn bed alarm off during shift and ambulated self to shower and proceeded to shower self. Order for Telesitter placed  Adria Devon, LPN
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
## 2019-06-04 ENCOUNTER — Inpatient Hospital Stay (HOSPITAL_COMMUNITY): Payer: Medicare Other

## 2019-06-04 DIAGNOSIS — M7989 Other specified soft tissue disorders: Secondary | ICD-10-CM | POA: Diagnosis not present

## 2019-06-04 DIAGNOSIS — I639 Cerebral infarction, unspecified: Secondary | ICD-10-CM | POA: Diagnosis not present

## 2019-06-04 DIAGNOSIS — S32462S Displaced associated transverse-posterior fracture of left acetabulum, sequela: Secondary | ICD-10-CM | POA: Diagnosis not present

## 2019-06-04 LAB — PROTIME-INR
INR: 1.4 — ABNORMAL HIGH (ref 0.8–1.2)
Prothrombin Time: 16.2 seconds — ABNORMAL HIGH (ref 11.4–15.2)

## 2019-06-04 LAB — GLUCOSE, CAPILLARY
Glucose-Capillary: 78 mg/dL (ref 70–99)
Glucose-Capillary: 152 mg/dL — ABNORMAL HIGH (ref 70–99)
Glucose-Capillary: 142 mg/dL — ABNORMAL HIGH (ref 70–99)
Glucose-Capillary: 151 mg/dL — ABNORMAL HIGH (ref 70–99)

## 2019-06-04 MED ORDER — WARFARIN SODIUM 2 MG PO TABS
4.0000 mg | ORAL_TABLET | Freq: Once | ORAL | Status: AC
  Administered 2019-06-04: 4 mg via ORAL
  Filled 2019-06-04: qty 2
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
## 2019-06-04 NOTE — Plan of Care (Signed)
  Problem: Consults Goal: RH STROKE PATIENT EDUCATION Description: See Patient Education module for education specifics  Outcome: Progressing Goal: Nutrition Consult-if indicated Outcome: Progressing Goal: Diabetes Guidelines if Diabetic/Glucose > 140 Description: If diabetic or lab glucose is > 140 mg/dl - Initiate Diabetes/Hyperglycemia Guidelines & Document Interventions  Outcome: Progressing   Problem: RH BOWEL ELIMINATION Goal: RH STG MANAGE BOWEL WITH ASSISTANCE Description: STG Manage Bowel with Moderate Assistance. Outcome: Progressing   Problem: RH BLADDER ELIMINATION Goal: RH STG MANAGE BLADDER WITH ASSISTANCE Description: STG Manage Bladder With Moderate Assistance Outcome: Progressing   Problem: RH SKIN INTEGRITY Goal: RH STG SKIN FREE OF INFECTION/BREAKDOWN Description: Patient will verbalize ways to prevent skin breakdown by discharge. Outcome: Progressing Goal: RH STG MAINTAIN SKIN INTEGRITY WITH ASSISTANCE Description: STG Maintain Skin Integrity With Moderate Assistance. Outcome: Progressing Goal: RH STG ABLE TO PERFORM INCISION/WOUND CARE W/ASSISTANCE Description: STG Able To Perform Incision/Wound Care With Moderate Assistance. Outcome: Progressing   Problem: RH SAFETY Goal: RH STG ADHERE TO SAFETY PRECAUTIONS W/ASSISTANCE/DEVICE Description: STG Adhere to Safety Precautions With Moderate Assistance/Device. Outcome: Progressing   Problem: RH PAIN MANAGEMENT Goal: RH STG PAIN MANAGED AT OR BELOW PT'S PAIN GOAL Outcome: Progressing   Problem: RH KNOWLEDGE DEFICIT Goal: RH STG INCREASE KNOWLEDGE OF DIABETES Description: Patient will verbalize ways to control and lower blood sugar while in the hospital. Outcome: Progressing Goal: RH STG INCREASE KNOWLEDGE OF HYPERTENSION Description: Patient will verbalize ways to prevent high blood pressure in his daily life. Outcome: Progressing Goal: RH STG INCREASE KNOWLEDGE OF DYSPHAGIA/FLUID INTAKE Description:  Patient will increase his food intake and remain free from aspiration while in the hospital. Outcome: Progressing Goal: RH STG INCREASE KNOWLEGDE OF HYPERLIPIDEMIA Description: Patient will verbalize ways to manage high cholesterol levels by discharge. Outcome: Progressing Goal: RH STG INCREASE KNOWLEDGE OF STROKE PROPHYLAXIS Description: Patient will verbalize signs and symptoms of stroke by discharge.  Outcome: Progressing
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
## 2019-06-04 NOTE — Progress Notes (Signed)
Provider notified patient came back positive for DVT in LLE per VAS tech. Results not released. Provider was notified.

## 2019-06-04 NOTE — Progress Notes (Signed)
DVT in left posterior tibial vein. Pt already on warfarin. Pharmacy is dosing. No change in plan.   Max Staggers, MD, [facility] 06/04/2019
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
## 2019-06-04 NOTE — Progress Notes (Signed)
Bilateral lower extremity venous duplex has been completed. Preliminary results can be found in CV Proc through chart review.  Results were given to the patient's nurse, Manuela Schwartz.  06/04/19 1:15 PM Carlos Levering RVT

## 2019-06-05 ENCOUNTER — Inpatient Hospital Stay (HOSPITAL_COMMUNITY): Payer: Medicare Other | Admitting: Speech Pathology

## 2019-06-05 ENCOUNTER — Inpatient Hospital Stay (HOSPITAL_COMMUNITY): Payer: Medicare Other | Admitting: Occupational Therapy

## 2019-06-05 ENCOUNTER — Inpatient Hospital Stay (HOSPITAL_COMMUNITY): Payer: Medicare Other

## 2019-06-05 DIAGNOSIS — I639 Cerebral infarction, unspecified: Secondary | ICD-10-CM | POA: Diagnosis not present

## 2019-06-05 LAB — PROTIME-INR
INR: 1.3 — ABNORMAL HIGH (ref 0.8–1.2)
Prothrombin Time: 15.4 seconds — ABNORMAL HIGH (ref 11.4–15.2)

## 2019-06-05 LAB — RENAL FUNCTION PANEL
Albumin: 2.3 g/dL — ABNORMAL LOW (ref 3.5–5.0)
Anion gap: 14 (ref 5–15)
BUN: 19 mg/dL (ref 6–20)
CO2: 26 mmol/L (ref 22–32)
Calcium: 8.7 mg/dL — ABNORMAL LOW (ref 8.9–10.3)
Chloride: 95 mmol/L — ABNORMAL LOW (ref 98–111)
Creatinine, Ser: 4.52 mg/dL — ABNORMAL HIGH (ref 0.61–1.24)
GFR calc Af Amer: 17 mL/min — ABNORMAL LOW (ref >60)
GFR calc non Af Amer: 15 mL/min — ABNORMAL LOW (ref >60)
Glucose, Bld: 153 mg/dL — ABNORMAL HIGH (ref 70–99)
Phosphorus: 1.9 mg/dL — ABNORMAL LOW (ref 2.5–4.6)
Potassium: 4.5 mmol/L (ref 3.5–5.1)
Sodium: 135 mmol/L (ref 135–145)

## 2019-06-05 LAB — CBC
HCT: 30.9 % — ABNORMAL LOW (ref 39.0–52.0)
Hemoglobin: 9 g/dL — ABNORMAL LOW (ref 13.0–17.0)
MCH: 28.9 pg (ref 26.0–34.0)
MCHC: 29.1 g/dL — ABNORMAL LOW (ref 30.0–36.0)
MCV: 99.4 fL (ref 80.0–100.0)
Platelets: 327 10*3/uL (ref 150–400)
RBC: 3.11 MIL/uL — ABNORMAL LOW (ref 4.22–5.81)
RDW: 18.4 % — ABNORMAL HIGH (ref 11.5–15.5)
WBC: 7.5 10*3/uL (ref 4.0–10.5)
nRBC: 0 % (ref 0.0–0.2)

## 2019-06-05 LAB — GLUCOSE, CAPILLARY
Glucose-Capillary: 77 mg/dL (ref 70–99)
Glucose-Capillary: 111 mg/dL — ABNORMAL HIGH (ref 70–99)
Glucose-Capillary: 154 mg/dL — ABNORMAL HIGH (ref 70–99)
Glucose-Capillary: 154 mg/dL — ABNORMAL HIGH (ref 70–99)

## 2019-06-05 MED ORDER — WARFARIN SODIUM 3 MG PO TABS
3.0000 mg | ORAL_TABLET | Freq: Once | ORAL | Status: AC
  Administered 2019-06-05: 3 mg via ORAL
  Filled 2019-06-05: qty 1

## 2019-06-05 MED ORDER — MELATONIN 3 MG PO TABS
3.0000 mg | ORAL_TABLET | Freq: Every day | ORAL | Status: DC
  Administered 2019-06-05 – 2019-06-08 (×4): 3 mg via ORAL
  Filled 2019-06-05 (×4): qty 1

## 2019-06-05 MED ORDER — HEPARIN SODIUM (PORCINE) 1000 UNIT/ML IJ SOLN
INTRAMUSCULAR | Status: AC
  Administered 2019-06-05: 3200 [IU]
  Filled 2019-06-05: qty 4

## 2019-06-05 MED ORDER — HEPARIN (PORCINE) 25000 UT/250ML-% IV SOLN
1650.0000 [IU]/h | INTRAVENOUS | Status: DC
  Administered 2019-06-05: 1600 [IU]/h via INTRAVENOUS
  Administered 2019-06-07: 1750 [IU]/h via INTRAVENOUS
  Filled 2019-06-05 (×5): qty 250
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
## 2019-06-05 NOTE — Progress Notes (Signed)
Inpatient Rehabilitation  Patient information reviewed and entered into eRehab system by Arlyn Bumpus M. Britne Borelli, M.A., CCC/SLP, PPS Coordinator.  Information including medical coding, functional ability and quality indicators will be reviewed and updated through discharge.    
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
## 2019-06-05 NOTE — Discharge Instructions (Addendum)
STROKE/TIA DISCHARGE INSTRUCTIONS SMOKING Cigarette smoking nearly doubles your risk of having a stroke & is the single most alterable risk factor  If you smoke or have smoked in the last 12 months, you are advised to quit smoking for your health.  Most of the excess cardiovascular risk related to smoking disappears within a year of stopping.  Ask you doctor about anti-smoking medications  [place] Quit Line: 1-800-QUIT NOW  Free Smoking Cessation Classes [phone]  CHOLESTEROL Know your levels; limit fat & cholesterol in your diet  Lipid Panel     Component Value Date/Time   CHOL 82 05/22/2019 0134   TRIG 117 05/22/2019 0134   HDL 25 (L) 05/22/2019 0134   CHOLHDL 3.3 05/22/2019 0134   VLDL 23 05/22/2019 0134   LDLCALC 34 05/22/2019 0134      Many patients benefit from treatment even if their cholesterol is at goal.  Goal: Total Cholesterol (CHOL) less than 160  Goal:  Triglycerides (TRIG) less than 150  Goal:  HDL greater than 40  Goal:  LDL (LDLCALC) less than 100   BLOOD PRESSURE American Stroke Association blood pressure target is less that 120/80 mm/Hg  Your discharge blood pressure is:  BP: 115/73  Monitor your blood pressure  Limit your salt and alcohol intake  Many individuals will require more than one medication for high blood pressure  DIABETES (A1c is a blood sugar average for last 3 months) Goal HGBA1c is under 7% (HBGA1c is blood sugar average for last 3 months)  Diabetes:    Lab Results  Component Value Date   HGBA1C 6.2 (H) 05/21/2019     Your HGBA1c can be lowered with medications, healthy diet, and exercise.  Check your blood sugar as directed by your physician  Call your physician if you experience unexplained or low blood sugars.  PHYSICAL ACTIVITY/REHABILITATION Goal is 30 minutes at least 4 days per week  Activity: Increase activity slowly, Therapies: Physical Therapy: Home Health Return to work:   Activity decreases your risk of heart  attack and stroke and makes your heart stronger.  It helps control your weight and blood pressure; helps you relax and can improve your mood.  Participate in a regular exercise program.  Talk with your doctor about the best form of exercise for you (dancing, walking, swimming, cycling).  DIET/WEIGHT Goal is to maintain a healthy weight  Your discharge diet is:  Diet Order            DIET SOFT Room service appropriate? Yes; Fluid consistency: Thin  Diet effective now              liquids Your height is:  Height: 5\' 9"  (175.3 cm) Your current weight is: Weight: 126.5 kg Your Body Mass Index (BMI) is:  BMI (Calculated): 41.16  Following the type of diet specifically designed for you will help prevent another stroke.  Your goal weight range is:    Your goal Body Mass Index (BMI) is 19-24.  Healthy food habits can help reduce 3 risk factors for stroke:  High cholesterol, hypertension, and excess weight.  RESOURCES Stroke/Support Group:  Call [phone]   STROKE EDUCATION PROVIDED/REVIEWED AND GIVEN TO PATIENT Stroke warning signs and symptoms How to activate emergency medical system (call 911). Medications prescribed at discharge. Need for follow-up after discharge. Personal risk factors for stroke. Pneumonia vaccine given:  Flu vaccine given:  My questions have been answered, the writing is legible, and I understand these instructions.  I will adhere  to these goals & educational materials that have been provided to me after my discharge from the hospital.   Inpatient Rehab Discharge Instructions  Max Pittman. Discharge date and time: No discharge date for patient encounter.   Activities/Precautions/ Functional Status: Activity: Touchdown weightbearing left lower extremity with posterior hip precautions Diet: renal diet Wound Care: keep wound clean and dry Functional status:  ___ No restrictions     ___ Walk up steps independently ___ 24/7  supervision/assistance   ___ Walk up steps with assistance ___ Intermittent supervision/assistance  ___ Bathe/dress independently ___ Walk with walker     _x__ Bathe/dress with assistance ___ Walk Independently    ___ Shower independently ___ Walk with assistance    ___ Shower with assistance ___ No alcohol     ___ Return to work/school ________   COMMUNITY REFERRALS UPON DISCHARGE:     Medical Equipment/Items Ordered: wheelchair, rolling walker, and drop arm bedside commode (item to be delivered to the home)                                                 Agency/Supplier: [employer] [phone]   Special Instructions: No driving smoking or alcohol  Call to arrange appointment with [facility] [phone] fax [phone] in the next 5 to 7 days to check INR while maintained on Coumadin   My questions have been answered and I understand these instructions. I will adhere to these goals and the provided educational materials after my discharge from the hospital.  Patient/Caregiver Signature _______________________________ Date __________  Clinician Signature _______________________________________ Date __________  Please bring this form and your medication list with you to all your follow-up doctor's appointments.   Information on my medicine - Coumadin   (Warfarin)  This medication education was reviewed with me or my healthcare representative as part of my discharge preparation.  The pharmacist that spoke with me during my hospital stay was:  Werner Lean, [facility]  Why was Coumadin prescribed for you? Coumadin was prescribed for you because you have a blood clot or a medical condition that can cause an increased risk of forming blood clots. Blood clots can cause serious health problems by blocking the flow of blood to the heart, lung, or brain. Coumadin can prevent harmful blood clots from forming. As a reminder your indication for Coumadin is:   Deep Vein Thrombosis  Treatment  What test will check on my response to Coumadin? While on Coumadin (warfarin) you will need to have an INR test regularly to ensure that your dose is keeping you in the desired range. The INR (international normalized ratio) number is calculated from the result of the laboratory test called prothrombin time (PT).  If an INR APPOINTMENT HAS NOT ALREADY BEEN MADE FOR YOU please schedule an appointment to have this lab work done by your health care provider within 7 days. Your INR goal is usually a number between:  2 to 3 or your provider may give you a more narrow range like 2-2.5.  Ask your health care provider during an office visit what your goal INR is.  What  do you need to  know  About  COUMADIN? Take Coumadin (warfarin) exactly as prescribed by your healthcare provider about the same time each day.  DO NOT stop taking without talking to the doctor who prescribed  the medication.  Stopping without other blood clot prevention medication to take the place of Coumadin may increase your risk of developing a new clot or stroke.  Get refills before you run out.  What do you do if you miss a dose? If you miss a dose, take it as soon as you remember on the same day then continue your regularly scheduled regimen the next day.  Do not take two doses of Coumadin at the same time.  Important Safety Information A possible side effect of Coumadin (Warfarin) is an increased risk of bleeding. You should call your healthcare provider right away if you experience any of the following: ? Bleeding from an injury or your nose that does not stop. ? Unusual colored urine (red or dark brown) or unusual colored stools (red or black). ? Unusual bruising for unknown reasons. ? A serious fall or if you hit your head (even if there is no bleeding).  Some foods or medicines interact with Coumadin (warfarin) and might alter your response to warfarin. To help avoid this: ? Eat a balanced diet, maintaining a  consistent amount of Vitamin K. ? Notify your provider about major diet changes you plan to make. ? Avoid alcohol or limit your intake to 1 drink for women and 2 drinks for men per day. (1 drink is 5 oz. wine, 12 oz. beer, or 1.5 oz. liquor.)  Make sure that ANY health care provider who prescribes medication for you knows that you are taking Coumadin (warfarin).  Also make sure the healthcare provider who is monitoring your Coumadin knows when you have started a new medication including herbals and non-prescription products.  Coumadin (Warfarin)  Major Drug Interactions  Increased Warfarin Effect Decreased Warfarin Effect  Alcohol (large quantities) Antibiotics (esp. Septra/Bactrim, Flagyl, Cipro) Amiodarone (Cordarone) Aspirin (ASA) Cimetidine (Tagamet) Megestrol (Megace) NSAIDs (ibuprofen, naproxen, etc.) Piroxicam (Feldene) Propafenone (Rythmol SR) Propranolol (Inderal) Isoniazid (INH) Posaconazole (Noxafil) Barbiturates (Phenobarbital) Carbamazepine (Tegretol) Chlordiazepoxide (Librium) Cholestyramine (Questran) Griseofulvin Oral Contraceptives Rifampin Sucralfate (Carafate) Vitamin K   Coumadin (Warfarin) Major Herbal Interactions  Increased Warfarin Effect Decreased Warfarin Effect  Garlic Ginseng Ginkgo biloba Coenzyme Q10 Green tea St. John's wort    Coumadin (Warfarin) FOOD Interactions  Eat a consistent number of servings per week of foods HIGH in Vitamin K (1 serving =  cup)  Collards (cooked, or boiled & drained) Kale (cooked, or boiled & drained) Mustard greens (cooked, or boiled & drained) Parsley *serving size only =  cup Spinach (cooked, or boiled & drained) Swiss chard (cooked, or boiled & drained) Turnip greens (cooked, or boiled & drained)  Eat a consistent number of servings per week of foods MEDIUM-HIGH in Vitamin K (1 serving = 1 cup)  Asparagus (cooked, or boiled & drained) Broccoli (cooked, boiled & drained, or raw & chopped) Brussel  sprouts (cooked, or boiled & drained) *serving size only =  cup Lettuce, raw (green leaf, endive, romaine) Spinach, raw Turnip greens, raw & chopped   These websites have more information on Coumadin (warfarin):  FailFactory.se; VeganReport.com.au;  ================================ Deep Vein Thrombosis    Deep vein thrombosis (DVT) is a condition in which a blood clot forms in a deep vein, such as a lower leg, thigh, or arm vein. A clot is blood that has thickened into a gel or solid. This condition is dangerous. It can lead to serious and even life-threatening complications if the clot travels to the lungs and causes a blockage (pulmonary embolism). It can also damage veins in  the leg. This can result in leg pain, swelling, discoloration, and sores (post-thrombotic syndrome).  What are the causes? This condition may be caused by:  A slowdown of blood flow.  Damage to a vein.  A condition that causes blood to clot more easily, such as an inherited clotting disorder.   What increases the risk? The following factors may make you more likely to develop this condition: 1. Being overweight. 2. Being older, especially over age 109. 52. Sitting or lying down for more than four hours. 4. Being in the hospital. 5. Lack of physical activity (sedentary lifestyle). 6. Pregnancy, being in childbirth, or having recently given birth. 7. Taking medicines that contain estrogen, such as medicines to prevent pregnancy. 8. Smoking. 9. A history of any of the following: ? Blood clots or a blood clotting disease. ? Peripheral vascular disease. ? Inflammatory bowel disease. ? Cancer. ? Heart disease. ? Genetic conditions that affect how your blood clots, such as Factor V Leiden mutation. ? Neurological diseases that affect your legs (leg paresis). ? A recent injury, such as a car accident. ? Major or lengthy surgery. ? A central line placed inside a large vein.  What are the  signs or symptoms? Symptoms of this condition include:  Swelling, pain, or tenderness in an arm or leg.  Warmth, redness, or discoloration in an arm or leg. If the clot is in your leg, symptoms may be more noticeable or worse when you stand or walk. Some people may not develop any symptoms.  How is this diagnosed? This condition is diagnosed with: 1. A medical history and physical exam. 2. Tests, such as: ? Blood tests. These are done to check how well your blood clots. ? Ultrasound. This is done to check for clots. ? Venogram. For this test, contrast dye is injected into a vein and X-rays are taken to check for any clots  How is this treated? Treatment for this condition depends on:  The cause of your DVT.  Your risk for bleeding or developing more clots.  Any other medical conditions that you have. Treatment may include: 1. Taking a blood thinner (anticoagulant). This type of medicine prevents clots from forming. It may be taken by mouth, injected under the skin, or injected through an IV (catheter). 2. Injecting clot-dissolving medicines into the affected vein (catheter-directed thrombolysis). 3. Having surgery. Surgery may be done to: ? Remove the clot. ? Place a filter in a large vein to catch blood clots before they reach the lungs. Some treatments may be continued for up to six months.  Follow these instructions at home: If you are taking blood thinners: 1. Take the medicine exactly as told by your health care provider. Some blood thinners need to be taken at the same time every day. Do not skip a dose. 2. Talk with your health care provider before you take any medicines that contain aspirin or NSAIDs. These medicines increase your risk for dangerous bleeding. 3. Ask your health care provider about foods and drugs that could change the way the medicine works (may interact). Avoid those things if your health care provider tells you to do so. 4. Blood thinners can cause easy  bruising and may make it difficult to stop bleeding. Because of this: ? Be very careful when using knives, scissors, or other sharp objects. ? Use an electric razor instead of a blade. ? Avoid activities that could cause injury or bruising, and follow instructions about how to prevent falls. 5. Wear  a medical alert bracelet or carry a card that lists what medicines you take.  General instructions  Take over-the-counter and prescription medicines only as told by your health care provider.  Return to your normal activities as told by your health care provider. Ask your health care provider what activities are safe for you.  Wear compression stockings if recommended by your health care provider.  Keep all follow-up visits as told by your health care provider. This is important.  How is this prevented? To lower your risk of developing this condition again: 1. For 30 or more minutes every day, do an activity that: ? Involves moving your arms and legs. ? Increases your heart rate. 2. When traveling for longer than four hours: ? Exercise your arms and legs every hour. ? Drink plenty of water. ? Avoid drinking alcohol. 3. Avoid sitting or lying for a long time without moving your legs. 4. If you have surgery or you are hospitalized, ask about ways to prevent blood clots. These may include taking frequent walks or using anticoagulants. 5. Stay at a healthy weight. 6. If you are a woman who is older than age 75, avoid unnecessary use of medicines that contain estrogen, such as some birth control pills. 7. Do not use any products that contain nicotine or tobacco, such as cigarettes and e-cigarettes. This is especially important if you take estrogen medicines. If you need help quitting, ask your health care provider.  Contact a health care provider if:  You miss a dose of your blood thinner.  Your menstrual period is heavier than usual.  You have unusual bruising.  Get help right away  if: 1. You have: ? New or increased pain, swelling, or redness in an arm or leg. ? Numbness or tingling in an arm or leg. ? Shortness of breath. ? Chest pain. ? A rapid or irregular heartbeat. ? A severe headache or confusion. ? A cut that will not stop bleeding. 2. There is blood in your vomit, stool, or urine. 3. You have a serious fall or accident, or you hit your head. 4. You feel light-headed or dizzy. 5. You cough up blood.  These symptoms may represent a serious problem that is an emergency. Do not wait to see if the symptoms will go away. Get medical help right away. Call your local emergency services (911 in the [place] Do not drive yourself to the hospital. Summary  Deep vein thrombosis (DVT) is a condition in which a blood clot forms in a deep vein, such as a lower leg, thigh, or arm vein.  Symptoms can include swelling, warmth, pain, and redness in your leg or arm.  This condition may be treated with a blood thinner (anticoagulant medicine), medicine that is injected to dissolve blood clots,compression stockings, or surgery.  If you are prescribed blood thinners, take them exactly as told. This information is not intended to replace advice given to you by your health care provider. Make sure you discuss any questions you have with your health care provider. Document Revised: 12/25/2016 Document Reviewed: 06/12/2016 Elsevier Patient Education  [place].

## 2019-06-05 NOTE — Progress Notes (Signed)
Occupational Therapy Session Note  Patient Details  Name: Donley Harland. MRN: [id number] Date of Birth: August 03, 1975  Today's Date: 06/05/2019 OT Individual Time: 3762-8315 OT Individual Time Calculation (min): 72 min    Short Term Goals: Week 1:  OT Short Term Goal 1 (Week 1): Pt will complete 1 UB self care task EOB with pain level reportedly manageable OT Short Term Goal 2 (Week 1): Pt will complete BSC or toilet transfer with LRAD and 1 assist OT Short Term Goal 3 (Week 1): Pt will thread both LEs into pants with Min A using AE as needed  Skilled Therapeutic Interventions/Progress Updates:   Pt greeted seated 1/2 way along EOB with breakfast tray, pt unable to bring LLE all the way towards the ground despite trying with leg lifter. Pt needed OT assist for LLE management. Pt sat EOB to eat breakfast while OT collected items for bathing/dressing including reacher, LH sponge, and sock-aid. Pt completed stand-pivot to wc with RW and min A. OT reviewed posterior hip and TDWB precautions and educated on [facility] equipment prior to BADL tasks. Pt could not recall any of his posterior hip precautions, so wet through these extensively. Pt declined to wash up stating he washed up good last night. Pt was able to demonstrate use of reacher and sock aid to don shorts using tech back method. OT reviewed TDWB LLE again prior to attempting to stand. Pt became very anxious and reported L LE pain at 9/10 and felt he needed pain meds prior to attempting standing. Nursing notified, but nursing did not make it to patients room prior to end of OT session. Worked on lateral leans to pull up pants with pillow placed between knees to prevent any hip internal rotation and max A to pull up pants. Pt then agreeable to get to wc and completed squat-pivot to R side with min A, but unable to tell if pt was maintaining TDWB throughout transfers. OT placed elevating leg rest on L side for comfort and positioning in wc. Pt able to  propel wc to the sink for grooming tasks. Pt was left seated in wc with alarm belt on, call bell in reach, and needs met.   Therapy Documentation Precautions:  Precautions Precautions: Fall, Posterior Hip Precaution Booklet Issued: No Restrictions Weight Bearing Restrictions: Yes LLE Weight Bearing: Touchdown weight bearing Pain: Pain Assessment Pain Scale: 0-10 Pain Score: 9  Pain Type: Acute pain Pain Orientation: Left Pain Descriptors / Indicators: Aching Pain Intervention(s): Repositioned   Therapy/Group: Individual Therapy  Valma Cava 06/05/2019, 3:41 PM

## 2019-06-05 NOTE — Plan of Care (Signed)
  Problem: Consults Goal: RH STROKE PATIENT EDUCATION Description: See Patient Education module for education specifics  Outcome: Progressing Goal: Nutrition Consult-if indicated Outcome: Progressing Goal: Diabetes Guidelines if Diabetic/Glucose > 140 Description: If diabetic or lab glucose is > 140 mg/dl - Initiate Diabetes/Hyperglycemia Guidelines & Document Interventions  Outcome: Progressing   Problem: RH BOWEL ELIMINATION Goal: RH STG MANAGE BOWEL WITH ASSISTANCE Description: STG Manage Bowel with Moderate Assistance. Outcome: Progressing   Problem: RH BLADDER ELIMINATION Goal: RH STG MANAGE BLADDER WITH ASSISTANCE Description: STG Manage Bladder With Moderate Assistance Outcome: Progressing   Problem: RH SKIN INTEGRITY Goal: RH STG SKIN FREE OF INFECTION/BREAKDOWN Description: Patient will verbalize ways to prevent skin breakdown by discharge. Outcome: Progressing Goal: RH STG MAINTAIN SKIN INTEGRITY WITH ASSISTANCE Description: STG Maintain Skin Integrity With Moderate Assistance. Outcome: Progressing Goal: RH STG ABLE TO PERFORM INCISION/WOUND CARE W/ASSISTANCE Description: STG Able To Perform Incision/Wound Care With Moderate Assistance. Outcome: Progressing   Problem: RH SAFETY Goal: RH STG ADHERE TO SAFETY PRECAUTIONS W/ASSISTANCE/DEVICE Description: STG Adhere to Safety Precautions With Moderate Assistance/Device. Outcome: Progressing   Problem: RH PAIN MANAGEMENT Goal: RH STG PAIN MANAGED AT OR BELOW PT'S PAIN GOAL Outcome: Progressing   Problem: RH KNOWLEDGE DEFICIT Goal: RH STG INCREASE KNOWLEDGE OF DIABETES Description: Patient will verbalize ways to control and lower blood sugar while in the hospital. Outcome: Progressing Goal: RH STG INCREASE KNOWLEDGE OF HYPERTENSION Description: Patient will verbalize ways to prevent high blood pressure in his daily life. Outcome: Progressing Goal: RH STG INCREASE KNOWLEDGE OF DYSPHAGIA/FLUID INTAKE Description:  Patient will increase his food intake and remain free from aspiration while in the hospital. Outcome: Progressing Goal: RH STG INCREASE KNOWLEGDE OF HYPERLIPIDEMIA Description: Patient will verbalize ways to manage high cholesterol levels by discharge. Outcome: Progressing Goal: RH STG INCREASE KNOWLEDGE OF STROKE PROPHYLAXIS Description: Patient will verbalize signs and symptoms of stroke by discharge.  Outcome: Progressing

## 2019-06-05 NOTE — Progress Notes (Signed)
Physical Therapy Session Note  Patient Details  Name: Max Pittman. MRN: [id number] Date of Birth: Jun 02, 1975  Today's Date: 06/05/2019 PT Individual Time:  -      Short Term Goals: Week 1:  PT Short Term Goal 1 (Week 1): Pt will perform supine<>sit with limited use of bed features and mod assist PT Short Term Goal 2 (Week 1): Pt will perform sit<>stand with CGA PT Short Term Goal 3 (Week 1): Pt will perform bed<>chair transfers with CGA PT Short Term Goal 4 (Week 1): Pt will ambulate at least 88ft with min assist PT Short Term Goal 5 (Week 1): Pt will initiate stair navigation  Skilled Therapeutic Interventions/Progress Updates:     Patient in w/c upon PT arrival. Patient alert and agreeable to PT session. Patient reported 9/10 L hip pain during session, RN made aware, reported that patient was pre-medicated prior to session. PT provided repositioning, rest breaks, and distraction as pain interventions throughout session. Discussed d/c planning, as patient expressed that he would like to go home at the end of the week. Discussed w/c level goals with short household ambulation due to decreased standing tolerance secondary to pain. Educated on all mobility goals for d/c and setting up family education. Patient reports that he may be able to be bumped up the steps in the w/c vs ambulating up the steps to enter his home, will follow-up during family education following stair trial. Patient very apprehensive to perform mobility and required significant time and discussion/encouragement to perform a lateral scoot transfer back to bed due to discomfort in w/c. PT provided demonstration of lateral scoot, stand pivot, and dicussed slide board with patient. Patient refused standing and slide board options due to pain. He performed a lateral scoot transfer with CGA-mod A due to releasing UE support during scooting with minor posterior LOB. Provided cues for TDWB on L LE, hand placement and head hips  relationship during transfer. Then discussed performing sit to supine, as patient wanted to use bed features, PT encouraged performing in a flat bed without rails with assist to simulate home set-up, patient initially agreed, then changed his mind due to pain. PT elevated HOB to ~80 degrees and assisted the patient with lifting B LEs with mod A onto the bed. Discussed current level of function and elevated pain levels limiting progress with mobility at this time. Educated on strategies for pain management such as pre-medicating prior to therapy, use of ice, which patient declined, and elevating LEs throughout the day to reduce swelling. Patient receptive to all education. Patient also reported during session that he toileted and took a shower with his mother in the room his first day on the unit without nursing or therapy assist. Educated patient on safety concerns and safety plan, patient stated understanding and agreed to have nursing or therapy assist with all mobility at this time. RN made aware of patient's report.  Patient requires significant time for all mobility due to fear of pain with movement, elevated pain levels, and decreased safety awareness.  Patient in bed with B LEs elevated at end of session with breaks locked, bed alarm set, and all needs within reach.    Therapy Documentation Precautions:  Precautions Precautions: Fall, Posterior Hip Precaution Booklet Issued: No Restrictions Weight Bearing Restrictions: Yes LLE Weight Bearing: Touchdown weight bearing    Therapy/Group: Individual Therapy  Gabrian Hoque L Omega Durante PT, DPT  06/05/2019, 6:23 AM
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
## 2019-06-05 NOTE — Progress Notes (Signed)
Occupational Therapy Session Note  Patient Details  Name: Max Pittman. MRN: [id number] Date of Birth: 12-Jul-1975  Today's Date: 06/05/2019 OT Individual Time: 5797-2820 OT Individual Time Calculation (min): 27 min   Short Term Goals: Week 1:  OT Short Term Goal 1 (Week 1): Pt will complete 1 UB self care task EOB with pain level reportedly manageable OT Short Term Goal 2 (Week 1): Pt will complete BSC or toilet transfer with LRAD and 1 assist OT Short Term Goal 3 (Week 1): Pt will thread both LEs into pants with Min A using AE as needed  Skilled Therapeutic Interventions/Progress Updates:    Pt greeted in the w/c today, ADL needs met. He was agreeable to engage in UB exercises. Started with 4# bar with pt reporting increased rib pain, changed to 2# bar which pt reported was much better. Guided him through B UE exercises x10-20 reps, pt requiring frequent rest breaks. Also instructed pt through shoulder stretches including scapular pinches and forward/backward rolls x10 reps. Pt left with RN at end of session to receive pain medicine.   Therapy Documentation Precautions:  Precautions Precautions: Fall, Posterior Hip Precaution Booklet Issued: No Restrictions Weight Bearing Restrictions: Yes LLE Weight Bearing: Touchdown weight bearing ADL: ADL Eating: Not assessed Grooming: Setup Where Assessed-Grooming: Bed level Upper Body Bathing: Not assessed Lower Body Bathing: Not assessed Upper Body Dressing: Maximal assistance Where Assessed-Upper Body Dressing: Bed level Lower Body Dressing: Maximal assistance Where Assessed-Lower Body Dressing: Bed level Toileting: Not assessed Toilet Transfer: Not assessed Tub/Shower Transfer: Not assessed ADL Comments: Eval significantly limited due to pts increased pain     Therapy/Group: Individual Therapy  Zanyia Silbaugh A Mataeo Ingwersen 06/05/2019, 12:18 PM
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
## 2019-06-05 NOTE — Progress Notes (Addendum)
  ILR wound stable without bleeding or hematoma. Mild ecchymosis.   Ok to shower, but otherwise to keep area clean and dry. No lotions, creams, ointments or powders to incision area. Would not re-cover with bandage.  Will ask [employer] to review monitor use with him. He has it in room but not plugged in.  Legrand Como [id number] [place], PA-C  06/05/2019 9:32 AM

## 2019-06-05 NOTE — Progress Notes (Signed)
Transitions of Care Pharmacist Note  Max Pittman. is a 44 y.o. male that has been diagnosed with DVT and A Fib and will be prescribed Coumadin (warfarin)  at discharge.   Patient Education: I provided the following education on warfarin 06/05/19 to the patient: How to take the medication Described what the medication is Signs of bleeding Signs/symptoms of VTE and stroke  Answered their questions  Discharge Medications Plan: The patient wants to have their discharge medications filled by the Transitions of Care pharmacy rather than their usual pharmacy.  The discharge orders pharmacy has been changed to the Transitions of Care pharmacy, the patient will receive a phone call regarding co-pay, and their medications will be delivered by the Transitions of Care pharmacy.    Thank you,   Sherren Kerns, PharmD PGY1 Acute Care Pharmacy Resident Jun 05, 2019
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
## 2019-06-05 NOTE — Care Management (Signed)
[facility] Individual Statement of Services  Patient Name:  Max Pittman.  Date:  06/05/2019  Welcome to the [facility].  Our goal is to provide you with an individualized program based on your diagnosis and situation, designed to meet your specific needs.  With this comprehensive rehabilitation program, you will be expected to participate in at least 3 hours of rehabilitation therapies Monday-Friday, with modified therapy programming on the weekends.  Your rehabilitation program will include the following services:  Physical Therapy (PT), Occupational Therapy (OT), Speech Therapy (ST), 24 hour per day rehabilitation nursing, Therapeutic Recreaction (TR), Psychology, Neuropsychology, Case Management (Social Worker), Rehabilitation Medicine, Nutrition Services, Pharmacy Services and Other  Weekly team conferences will be held on Tuesdays to discuss your progress.  Your Social Worker will talk with you frequently to get your input and to update you on team discussions.  Team conferences with you and your family in attendance may also be held.  Expected length of stay: 2 weeks    Overall anticipated outcome: Supervision  Depending on your progress and recovery, your program may change. Your Social Worker will coordinate services and will keep you informed of any changes. Your Social Worker's name and contact numbers are listed  below.  The following services may also be recommended but are not provided by the [facility] will be made to provide these services after discharge if needed.  Arrangements include referral to agencies that provide these services.  Your insurance has been verified to be:  Medicare A/B and [place]  Your primary doctor is:  Anastasia Pall  Pertinent information  will be shared with your doctor and your insurance company.  Social Worker:  Loralee Pacas, [place] or ([phone]  Information discussed with and copy given to patient by: Rana Snare, 06/05/2019, 10:33 AM

## 2019-06-06 ENCOUNTER — Inpatient Hospital Stay (HOSPITAL_COMMUNITY): Payer: Medicare Other | Admitting: Occupational Therapy

## 2019-06-06 ENCOUNTER — Encounter (HOSPITAL_COMMUNITY): Payer: Medicare Other | Admitting: Psychology

## 2019-06-06 ENCOUNTER — Inpatient Hospital Stay (HOSPITAL_COMMUNITY): Payer: Medicare Other

## 2019-06-06 LAB — CBC
HCT: 27.9 % — ABNORMAL LOW (ref 39.0–52.0)
Hemoglobin: 8.3 g/dL — ABNORMAL LOW (ref 13.0–17.0)
MCH: 29.2 pg (ref 26.0–34.0)
MCHC: 29.7 g/dL — ABNORMAL LOW (ref 30.0–36.0)
MCV: 98.2 fL (ref 80.0–100.0)
Platelets: 277 10*3/uL (ref 150–400)
RBC: 2.84 MIL/uL — ABNORMAL LOW (ref 4.22–5.81)
RDW: 17.9 % — ABNORMAL HIGH (ref 11.5–15.5)
WBC: 6 10*3/uL (ref 4.0–10.5)
nRBC: 0 % (ref 0.0–0.2)

## 2019-06-06 LAB — GLUCOSE, CAPILLARY
Glucose-Capillary: 94 mg/dL (ref 70–99)
Glucose-Capillary: 121 mg/dL — ABNORMAL HIGH (ref 70–99)
Glucose-Capillary: 114 mg/dL — ABNORMAL HIGH (ref 70–99)
Glucose-Capillary: 163 mg/dL — ABNORMAL HIGH (ref 70–99)

## 2019-06-06 LAB — PROTIME-INR
INR: 1.6 — ABNORMAL HIGH (ref 0.8–1.2)
Prothrombin Time: 18.1 seconds — ABNORMAL HIGH (ref 11.4–15.2)

## 2019-06-06 LAB — HEPARIN LEVEL (UNFRACTIONATED)
Heparin Unfractionated: 0.24 IU/mL — ABNORMAL LOW (ref 0.30–0.70)
Heparin Unfractionated: <0.1 IU/mL — ABNORMAL LOW (ref 0.30–0.70)
Heparin Unfractionated: 0.42 IU/mL (ref 0.30–0.70)

## 2019-06-06 MED ORDER — WARFARIN SODIUM 3 MG PO TABS
3.0000 mg | ORAL_TABLET | Freq: Once | ORAL | Status: AC
  Administered 2019-06-06: 3 mg via ORAL
  Filled 2019-06-06: qty 1

## 2019-06-06 NOTE — Progress Notes (Signed)
Physical Therapy Session Note  Patient Details  Name: Max Pittman. MRN: [id number] Date of Birth: November 06, 1975  Today's Date: 06/06/2019 PT Individual Time: 9798-9211 PT Individual Time Calculation (min): 70 min   Short Term Goals: Week 1:  PT Short Term Goal 1 (Week 1): Pt will perform supine<>sit with limited use of bed features and mod assist PT Short Term Goal 2 (Week 1): Pt will perform sit<>stand with CGA PT Short Term Goal 3 (Week 1): Pt will perform bed<>chair transfers with CGA PT Short Term Goal 4 (Week 1): Pt will ambulate at least 68ft with min assist PT Short Term Goal 5 (Week 1): Pt will initiate stair navigation  Skilled Therapeutic Interventions/Progress Updates:     Patient in w/c with B LEs elevated on the bed upon PT arrival. Patient alert and agreeable to PT session. Patient reported 4-5/10 L hip pain during session, RN made aware. PT provided repositioning, rest breaks, and distraction as pain interventions throughout session. Patient also reported that he was getting OOB to the w/c on his own prior to his first therapy session. PT provided education about safety concerns and increased fall risk and instructed patient to call for assistance from nursing staff for any transfer from bed<>chair or bathroom. Also encouraged him to stay in bed prior to therapy to work on bed mobility during sessions. Patient stated understanding, RN made aware at end of session.   Therapeutic Activity: Bed Mobility: Patient performed sit to supine with min-mod A for LE management in a flat bed without use of bed rails. Provided verbal cues for lowering to his elbow for improved trunk control and use of a leg lifter to bring his L LE up to the bed. Performed with bed elevated to simulate home set-up. Transfers: Patient performed stand pivot x3 with CGA-min A using a RW. Provided verbal cues for TDWB on L LE, use of UEs to control impact on R foot, and forward weight shift with L foot placed  ahead for TDWB to stand. Improved compliance with weight bearing each trial. Patient performed a simulated sedan height car transfer with min A using a leg lifter for L LE managemtn. He performed a squat pivot transfer into the car from the w/c with poor compliance to TDWB on L LE and decreased safety awareness with poor control on descent. He was unable to perform unlevel squat pivot back to the w/c safely x2 attempts, elevated car simulator to small SUV height and patient perform stand pivot to the w/c with min A. He was unable to bring his LEs into the car while maintaining posterior hip precautions due to elevated floor board. Educated on pushing the seat back and leaning the chair back to make room for his legs and leaning back with use of a leg lifter to bring L LE into the car without breaking 90 degrees of flexion. Provided cues for safe technique throughout.  Wheelchair Mobility:  Patient propelled wheelchair 85 feet x2 with mod I. Provided verbal cues for use of breaks x1 and demonstration of leg rest management x1 with patient performing with supervision for min cues x1.   Discussed d/c planning at end of session. Patient reports that he is having a ramp built at his home for easier access with the w/c. Also reports that entry door is too small for 22"x18" w/c, will trial mobility in 20"x18 w/c tomorrow, educated on limited prolonged sitting in a smaller w/c to prevent skin breakdown at his hips. Patient stated  understanding. Discussed d/c date as patient was hoping to go home sooner. Informed his that the team will monitor his progress and adjust d/c date as appropriate, patient in agreement.   Patient in bed at end of session with breaks locked, bed alarm set, and all needs within reach.    Therapy Documentation Precautions:  Precautions Precautions: Fall, Posterior Hip Precaution Booklet Issued: No Restrictions Weight Bearing Restrictions: Yes LLE Weight Bearing: Touchdown weight  bearing    Therapy/Group: Individual Therapy  Max Pittman PT, DPT  06/06/2019, 5:14 PM
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
## 2019-06-06 NOTE — Progress Notes (Signed)
Occupational Therapy Session Note  Patient Details  Name: Max John Hasan Jr. MRN: [id number] Date of Birth: 07/20/1975  Today's Date: 06/06/2019 OT Individual Time: 0832-0930 OT Individual Time Calculation (min): 58 min   Short Term Goals: Week 1:  OT Short Term Goal 1 (Week 1): Pt will complete 1 UB self care task EOB with pain level reportedly manageable OT Short Term Goal 2 (Week 1): Pt will complete BSC or toilet transfer with LRAD and 1 assist OT Short Term Goal 3 (Week 1): Pt will thread both LEs into pants with Min A using AE as needed  Skilled Therapeutic Interventions/Progress Updates:    Pt greeted seated EOB reporting 9/10 pain in LLE. Nursing notified and will be in shortly. OT reviewed posterior hip precautions, then pt was able to don socks at EOB with good recall of how to use sock-aid. Attempted sit<>stand from EOB with pt needed EXTRA time to initiate power up 2/2 pain and anxiety, but was unable to stand on first two attempts. Nursing then entered to administer pain meds. Pt ready to try to stand again. OT raised bed up slightly to simulate height of bed at home. Pt completed sit<>stand with RW and min A. Pt was able to maintain TDWB through LLE with min verbal cues, then pivot to wc with min A and RW. Pt propelled wc to therapy gym with supervision. Practiced sit<>stands with min A progressing to CGA with RW. Had pt practice alternating removing UE support to touch OTs hand to simulate removing UE for clothing management within BADLs. Pt returned to room at end of session and left seated in wc with alarm belt on, call bell in reach and needs met.   Therapy Documentation Precautions:  Precautions Precautions: Fall, Posterior Hip Precaution Booklet Issued: No Restrictions Weight Bearing Restrictions: Yes LLE Weight Bearing: Touchdown weight bearing Pain: Pain Assessment Pain Scale: 0-10 Pain Score: 8  Pain Type: Acute pain Pain Location: Leg Pain Orientation:  Left Pain Descriptors / Indicators: Aching Pain Frequency: Constant Pain Onset: On-going Patients Stated Pain Goal: 2 Pain Intervention(s): RN administered pain medications   Therapy/Group: Individual Therapy   S  06/06/2019, 8:57 AM  
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
## 2019-06-06 NOTE — Progress Notes (Signed)
Physical Therapy Session Note  Patient Details  Name: Max Pittman. MRN: [id number] Date of Birth: 26-Jun-1975  Today's Date: 06/06/2019 PT Individual Time: 1000-1055 PT Individual Time Calculation (min): 55 min   Short Term Goals: Week 1:  PT Short Term Goal 1 (Week 1): Pt will perform supine<>sit with limited use of bed features and mod assist PT Short Term Goal 2 (Week 1): Pt will perform sit<>stand with CGA PT Short Term Goal 3 (Week 1): Pt will perform bed<>chair transfers with CGA PT Short Term Goal 4 (Week 1): Pt will ambulate at least 16ft with min assist PT Short Term Goal 5 (Week 1): Pt will initiate stair navigation  Skilled Therapeutic Interventions/Progress Updates:    Session focused on d/c planning and education, functional transfer training with stand pivot and squat pivot technique, w/c mobility on unit for functional mobility and UE strengthening/endurance, and initiation of gait training.  Discussed size of w/c (currently in 22" wide which is 30" wide) and limitations of home. Pt reports 29" doorway for entry and 30" to bedroom. States it would not fit in the bathroom as it is smaller than both of those. Reports planning for ramp to be installed for home entry access. Suggested if able to sit in smaller w/c (20" wide), would need to be able to walk through doorways for someone to move w/c into the room.   Squat pivot to the R with focus on set up and positioning of w/c with cues for technique and extra time due to pain/anxiety to complete at CGA/min assist level. Discussed options for various heights of surfaces may impact his transfer technique as well as current pain level. Education in regards to posterior hip precautions potentially limiting ability to perform couch transfer (low) for sit <> stand but could do a lateral transfer.   Pt performed sit <> stands x 4 with CGA overall with cues for TDWB status. INitiated gait forwards and backwards x 4' each with min  assist. Progressed to gait trial with RW x 17' with cues for more efficient technique and LLE foot placement to maintain precautions.   Pt able to propel w/c on unit mod I with extra time. End of session transferred to toilet with CGA stand pivot with grab bar with cues for positioning of w/c for safety. Notified NT of patient in bathroom.   Therapy Documentation Precautions:  Precautions Precautions: Fall, Posterior Hip Precaution Booklet Issued: No Restrictions Weight Bearing Restrictions: Yes LLE Weight Bearing: Touchdown weight bearing Pain:  Premedicated for pain. Rates 8/10 in LLE. Repositioned and rest breaks as needed.    Therapy/Group: Individual Therapy  [name], PT, DPT, CBIS  06/06/2019, 1:18 PM
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
## 2019-06-06 NOTE — Plan of Care (Signed)
  Problem: Consults Goal: RH STROKE PATIENT EDUCATION Description: See Patient Education module for education specifics  Outcome: Progressing Goal: Nutrition Consult-if indicated Outcome: Progressing Goal: Diabetes Guidelines if Diabetic/Glucose > 140 Description: If diabetic or lab glucose is > 140 mg/dl - Initiate Diabetes/Hyperglycemia Guidelines & Document Interventions  Outcome: Progressing   Problem: RH BOWEL ELIMINATION Goal: RH STG MANAGE BOWEL WITH ASSISTANCE Description: STG Manage Bowel with Moderate Assistance. Outcome: Progressing   Problem: RH BLADDER ELIMINATION Goal: RH STG MANAGE BLADDER WITH ASSISTANCE Description: STG Manage Bladder With Moderate Assistance Outcome: Progressing   Problem: RH SKIN INTEGRITY Goal: RH STG SKIN FREE OF INFECTION/BREAKDOWN Description: Patient will verbalize ways to prevent skin breakdown by discharge. Outcome: Progressing Goal: RH STG MAINTAIN SKIN INTEGRITY WITH ASSISTANCE Description: STG Maintain Skin Integrity With Moderate Assistance. Outcome: Progressing Goal: RH STG ABLE TO PERFORM INCISION/WOUND CARE W/ASSISTANCE Description: STG Able To Perform Incision/Wound Care With Moderate Assistance. Outcome: Progressing   Problem: RH SAFETY Goal: RH STG ADHERE TO SAFETY PRECAUTIONS W/ASSISTANCE/DEVICE Description: STG Adhere to Safety Precautions With Moderate Assistance/Device. Outcome: Progressing   Problem: RH PAIN MANAGEMENT Goal: RH STG PAIN MANAGED AT OR BELOW PT'S PAIN GOAL Outcome: Progressing   Problem: RH KNOWLEDGE DEFICIT Goal: RH STG INCREASE KNOWLEDGE OF DIABETES Description: Patient will verbalize ways to control and lower blood sugar while in the hospital. Outcome: Progressing Goal: RH STG INCREASE KNOWLEDGE OF HYPERTENSION Description: Patient will verbalize ways to prevent high blood pressure in his daily life. Outcome: Progressing Goal: RH STG INCREASE KNOWLEDGE OF DYSPHAGIA/FLUID INTAKE Description:  Patient will increase his food intake and remain free from aspiration while in the hospital. Outcome: Progressing Goal: RH STG INCREASE KNOWLEGDE OF HYPERLIPIDEMIA Description: Patient will verbalize ways to manage high cholesterol levels by discharge. Outcome: Progressing Goal: RH STG INCREASE KNOWLEDGE OF STROKE PROPHYLAXIS Description: Patient will verbalize signs and symptoms of stroke by discharge.  Outcome: Progressing
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
## 2019-06-06 NOTE — Progress Notes (Signed)
At 1047, Probation officer notified pharmacy that a new bag of IV heparin was needed for this patient. Pharmacy representative stated they would call when it was ready due to the tube system being down. At 1110 the infusing bag of heparin emptied and patient was disconnected awaiting new bag.

## 2019-06-07 ENCOUNTER — Inpatient Hospital Stay (HOSPITAL_COMMUNITY): Payer: Medicare Other

## 2019-06-07 ENCOUNTER — Inpatient Hospital Stay (HOSPITAL_COMMUNITY): Payer: Medicare Other | Admitting: Occupational Therapy

## 2019-06-07 LAB — HEPARIN LEVEL (UNFRACTIONATED): Heparin Unfractionated: 0.43 IU/mL (ref 0.30–0.70)

## 2019-06-07 LAB — GLUCOSE, CAPILLARY
Glucose-Capillary: 81 mg/dL (ref 70–99)
Glucose-Capillary: 86 mg/dL (ref 70–99)
Glucose-Capillary: 126 mg/dL — ABNORMAL HIGH (ref 70–99)
Glucose-Capillary: 89 mg/dL (ref 70–99)

## 2019-06-07 LAB — CBC
HCT: 29.6 % — ABNORMAL LOW (ref 39.0–52.0)
Hemoglobin: 8.7 g/dL — ABNORMAL LOW (ref 13.0–17.0)
MCH: 29.3 pg (ref 26.0–34.0)
MCHC: 29.4 g/dL — ABNORMAL LOW (ref 30.0–36.0)
MCV: 99.7 fL (ref 80.0–100.0)
Platelets: 286 10*3/uL (ref 150–400)
RBC: 2.97 MIL/uL — ABNORMAL LOW (ref 4.22–5.81)
RDW: 17.5 % — ABNORMAL HIGH (ref 11.5–15.5)
WBC: 7.2 10*3/uL (ref 4.0–10.5)
nRBC: 0 % (ref 0.0–0.2)

## 2019-06-07 LAB — PROTIME-INR
INR: 1.7 — ABNORMAL HIGH (ref 0.8–1.2)
Prothrombin Time: 19.7 seconds — ABNORMAL HIGH (ref 11.4–15.2)

## 2019-06-07 MED ORDER — OXYCODONE HCL ER 10 MG PO T12A
10.0000 mg | EXTENDED_RELEASE_TABLET | Freq: Two times a day (BID) | ORAL | Status: DC
  Administered 2019-06-07 – 2019-06-13 (×13): 10 mg via ORAL
  Filled 2019-06-07 (×13): qty 1

## 2019-06-07 MED ORDER — WARFARIN SODIUM 3 MG PO TABS
3.0000 mg | ORAL_TABLET | Freq: Once | ORAL | Status: AC
  Administered 2019-06-07: 3 mg via ORAL
  Filled 2019-06-07: qty 1

## 2019-06-07 NOTE — Progress Notes (Signed)
Vernie Murders, RN called notified that the pt's HD treatment has been moved to 06/07/19.

## 2019-06-07 NOTE — Progress Notes (Signed)
Physical Therapy Session Note  Patient Details  Name: Max Pittman. MRN: [id number] Date of Birth: 02-24-1975  Today's Date: 06/07/2019 PT Individual Time: 6606-3016 PT Individual Time Calculation (min): 45 min   Short Term Goals: Week 1:  PT Short Term Goal 1 (Week 1): Pt will perform supine<>sit with limited use of bed features and mod assist PT Short Term Goal 2 (Week 1): Pt will perform sit<>stand with CGA PT Short Term Goal 3 (Week 1): Pt will perform bed<>chair transfers with CGA PT Short Term Goal 4 (Week 1): Pt will ambulate at least 57ft with min assist PT Short Term Goal 5 (Week 1): Pt will initiate stair navigation  Skilled Therapeutic Interventions/Progress Updates:     Patient in bed with lights off and hands over his eyes upon PT arrival. Patient reported 9/10 headache and 8/10 L hip pain, RN made aware and patient premedicated prior to session. PT provided repositioning, rest breaks, and distraction as pain interventions throughout session. Vitals: BP 107/63, HR 92, SPO2 100% on RA. Patient reported that he has a history of migraines at baseline. Provided patient with a cold compress and patient was agreeable to bed level exercises due to headache.   Therapeutic Exercise: Patient performed the following exercises with verbal and tactile cues for proper technique. -B ankle pumpts x10 -L quad sets x10 with 5 sec hold -B glut sets x10 with 5 sec hold -L heel slides x10 with AAROM (<25% assist) -L hip abd/add x10 with AAROM (<25% assist)  Educated on use of diaphragmatic breathing with exercise to avoid Val salva maneuver and for pain management. Located patient's flutter valve and educated on technique and frequency to prevent chest congestion. Also educated patient on performing daily foot checks to assess for wounds/abrasions/lacerations due to decreased sensation. Provided patient with hand-held mirror, will instruct in use when patient can tolerate, limited by HA  today.   Patient reported that he needed to rest following exercises and education and declined any further therapy due to severe headache.   Patient in bed with lights off at end of session with breaks locked, bed alarm set and 4 rails up, per patient request, and all needs within reach. Patient missed 15 min of skilled PT due to headache, RN made aware. Will attempt to make-up missed time as able.      Therapy Documentation Precautions:  Precautions Precautions: Fall, Posterior Hip Precaution Booklet Issued: No Restrictions Weight Bearing Restrictions: Yes LLE Weight Bearing: Touchdown weight bearing General: PT Amount of Missed Time (min): 15 Minutes    Therapy/Group: Individual Therapy  Jennifr Gaeta L Kaitlan Bin PT, DPT  06/07/2019, 12:42 PM
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
## 2019-06-07 NOTE — Progress Notes (Signed)
Occupational Therapy Session Note  Patient Details  Name: Max Pittman. MRN: [id number] Date of Birth: Jul 05, 1975  Today's Date: 06/07/2019 OT Individual Time: 2542-7062 OT Individual Time Calculation (min): 72 min    Short Term Goals: Week 1:  OT Short Term Goal 1 (Week 1): Pt will complete 1 UB self care task EOB with pain level reportedly manageable OT Short Term Goal 2 (Week 1): Pt will complete BSC or toilet transfer with LRAD and 1 assist OT Short Term Goal 3 (Week 1): Pt will thread both LEs into pants with Min A using AE as needed  Skilled Therapeutic Interventions/Progress Updates:  Pt received supine in bed asleep upon OTA arrival however easily able to arouse. Pt able to state 2/3 posterior hip precautions this session. Pt required MAX A for bed mobility this session with HOB elevated. Pt required most assist to advance LLE to EOB and required step by step cues for body mechanics. Pt initially utilizing leg lifter to maneuver LLE but ultimately required assist from OTA to advance to EOB. Pt complete stand pivot to w/c to pts L side with MIN A with RW. Pt complete seated oral care at sink with supervision.  Pt able to complete w/c propulsion from room to therapy gym with supervision. Remainder of session focus on dynamic standing balance with horseshoe activity. Pt able to stand ~ 3 mins total needing MIN A- MOD A for sit<>stand with RW. Pt very particular about set- up throughout session and often states "I got it." Pt transported back to room with total A for time mgmt where pt reporting needing to void bladder.MOD A for toilet transfer this session as pt noted to not maintain [place] precautions during transfersProvided education on importance of compliance with [place] precautions. MOD A toileting tasks in standing. Pt left seated in w/c with alarm belt activated and all needs within reach.  Therapy Documentation Precautions:  Precautions Precautions: Fall, Posterior  Hip Precaution Booklet Issued: No Restrictions Weight Bearing Restrictions: Yes LLE Weight Bearing: Touchdown weight bearing General:   Vital Signs:   Pain: Pain Assessment Pain Scale: 0-10 Pain Score: 9  Pain Type: Acute pain Pain Location: Rib cage Pain Orientation: Right;Left Pain Radiating Towards: leg Pain Descriptors / Indicators: Stabbing Pain Frequency: Constant Pain Onset: On-going Patients Stated Pain Goal: 2 Pain Intervention(s): Medication (See eMAR) Pain: Pt reports 9/10 pain with pt already been given pain meds. Provided increased rest breaks and distraction as pain mgmt strategy.   Therapy/Group: Individual Therapy  [name] 06/07/2019, 12:37 PM

## 2019-06-07 NOTE — Plan of Care (Signed)
  Problem: Consults Goal: RH STROKE PATIENT EDUCATION Description: See Patient Education module for education specifics  Outcome: Progressing Goal: Nutrition Consult-if indicated Outcome: Progressing Goal: Diabetes Guidelines if Diabetic/Glucose > 140 Description: If diabetic or lab glucose is > 140 mg/dl - Initiate Diabetes/Hyperglycemia Guidelines & Document Interventions  Outcome: Progressing   Problem: RH BOWEL ELIMINATION Goal: RH STG MANAGE BOWEL WITH ASSISTANCE Description: STG Manage Bowel with Moderate Assistance. Outcome: Progressing   Problem: RH BLADDER ELIMINATION Goal: RH STG MANAGE BLADDER WITH ASSISTANCE Description: STG Manage Bladder With Moderate Assistance Outcome: Progressing   Problem: RH SKIN INTEGRITY Goal: RH STG SKIN FREE OF INFECTION/BREAKDOWN Description: Patient will verbalize ways to prevent skin breakdown by discharge. Outcome: Progressing Goal: RH STG MAINTAIN SKIN INTEGRITY WITH ASSISTANCE Description: STG Maintain Skin Integrity With Moderate Assistance. Outcome: Progressing Goal: RH STG ABLE TO PERFORM INCISION/WOUND CARE W/ASSISTANCE Description: STG Able To Perform Incision/Wound Care With Moderate Assistance. Outcome: Progressing   Problem: RH SAFETY Goal: RH STG ADHERE TO SAFETY PRECAUTIONS W/ASSISTANCE/DEVICE Description: STG Adhere to Safety Precautions With Moderate Assistance/Device. Outcome: Progressing   Problem: RH KNOWLEDGE DEFICIT Goal: RH STG INCREASE KNOWLEDGE OF DIABETES Description: Patient will verbalize ways to control and lower blood sugar while in the hospital. Outcome: Progressing Goal: RH STG INCREASE KNOWLEDGE OF HYPERTENSION Description: Patient will verbalize ways to prevent high blood pressure in his daily life. Outcome: Progressing Goal: RH STG INCREASE KNOWLEDGE OF DYSPHAGIA/FLUID INTAKE Description: Patient will increase his food intake and remain free from aspiration while in the hospital. Outcome:  Progressing Goal: RH STG INCREASE KNOWLEGDE OF HYPERLIPIDEMIA Description: Patient will verbalize ways to manage high cholesterol levels by discharge. Outcome: Progressing Goal: RH STG INCREASE KNOWLEDGE OF STROKE PROPHYLAXIS Description: Patient will verbalize signs and symptoms of stroke by discharge.  Outcome: Progressing   Problem: RH PAIN MANAGEMENT Goal: RH STG PAIN MANAGED AT OR BELOW PT'S PAIN GOAL Outcome: Progressing

## 2019-06-07 NOTE — Progress Notes (Signed)
Occupational Therapy Session Note  Patient Details  Name: Max Pittman. MRN: [id number] Date of Birth: Oct 08, 1975  Today's Date: 06/07/2019 OT Individual Time: 0051-1021 OT Individual Time Calculation (min): 29 min  and Today's Date: 06/07/2019 OT Missed Time: 30 Minutes Missed Time Reason: Pain   Short Term Goals: Week 1:  OT Short Term Goal 1 (Week 1): Pt will complete 1 UB self care task EOB with pain level reportedly manageable OT Short Term Goal 2 (Week 1): Pt will complete BSC or toilet transfer with LRAD and 1 assist OT Short Term Goal 3 (Week 1): Pt will thread both LEs into pants with Min A using AE as needed  Skilled Therapeutic Interventions/Progress Updates:    Upon entering the room, pt supine in bed and just starting breakfast. Pt reports 9/10 pain in L hip while in bed. He reports sleeping over 10 hours and waking up with intense pain this morning. OT discussed pain management strategies and requested he asks RN to write pain medications and due times on dry erase board for him to keep track of what he is taking and to ask for it when it is due. Pt verbalized understanding. OT providing pt with energy conservation handouts and began education on this topic for self care tasks in preparation for discharge from hospital. Pt actively engaged in conversation and left with handouts to read in further detail on his own. Pt declined further OT intervention secondary to pain. Pt remained in bed and eating with all needs within reach.  Therapy Documentation Precautions:  Precautions Precautions: Fall, Posterior Hip Precaution Booklet Issued: No Restrictions Weight Bearing Restrictions: Yes LLE Weight Bearing: Touchdown weight bearing General: General OT Amount of Missed Time: 30 Minutes Vital Signs: Therapy Vitals Temp: 98.8 F (37.1 C) Temp Source: Oral Pulse Rate: 96 Resp: 16 BP: 104/68 Patient Position (if appropriate): Lying Oxygen Therapy SpO2: 98 % O2  Device: Room Air Pain:   ADL: ADL Eating: Not assessed Grooming: Setup Where Assessed-Grooming: Bed level Upper Body Bathing: Not assessed Lower Body Bathing: Not assessed Upper Body Dressing: Maximal assistance Where Assessed-Upper Body Dressing: Bed level Lower Body Dressing: Maximal assistance Where Assessed-Lower Body Dressing: Bed level Toileting: Not assessed Toilet Transfer: Not assessed Tub/Shower Transfer: Not assessed ADL Comments: Eval significantly limited due to pts increased pain   Therapy/Group: Individual Therapy  Gypsy Decant 06/07/2019, 7:46 AM
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
## 2019-06-08 ENCOUNTER — Inpatient Hospital Stay (HOSPITAL_COMMUNITY): Payer: Medicare Other

## 2019-06-08 ENCOUNTER — Inpatient Hospital Stay (HOSPITAL_COMMUNITY): Payer: Medicare Other | Admitting: Occupational Therapy

## 2019-06-08 LAB — RENAL FUNCTION PANEL
Albumin: 1.9 g/dL — ABNORMAL LOW (ref 3.5–5.0)
Anion gap: 11 (ref 5–15)
BUN: 55 mg/dL — ABNORMAL HIGH (ref 6–20)
CO2: 25 mmol/L (ref 22–32)
Calcium: 8.9 mg/dL (ref 8.9–10.3)
Chloride: 96 mmol/L — ABNORMAL LOW (ref 98–111)
Creatinine, Ser: 9.37 mg/dL — ABNORMAL HIGH (ref 0.61–1.24)
GFR calc Af Amer: 7 mL/min — ABNORMAL LOW (ref >60)
GFR calc non Af Amer: 6 mL/min — ABNORMAL LOW (ref >60)
Glucose, Bld: 158 mg/dL — ABNORMAL HIGH (ref 70–99)
Phosphorus: 3.3 mg/dL (ref 2.5–4.6)
Potassium: 5.9 mmol/L — ABNORMAL HIGH (ref 3.5–5.1)
Sodium: 132 mmol/L — ABNORMAL LOW (ref 135–145)

## 2019-06-08 LAB — HEPARIN LEVEL (UNFRACTIONATED): Heparin Unfractionated: 0.54 IU/mL (ref 0.30–0.70)

## 2019-06-08 LAB — CBC
HCT: 32.1 % — ABNORMAL LOW (ref 39.0–52.0)
Hemoglobin: 9.6 g/dL — ABNORMAL LOW (ref 13.0–17.0)
MCH: 29.5 pg (ref 26.0–34.0)
MCHC: 29.9 g/dL — ABNORMAL LOW (ref 30.0–36.0)
MCV: 98.8 fL (ref 80.0–100.0)
Platelets: 291 10*3/uL (ref 150–400)
RBC: 3.25 MIL/uL — ABNORMAL LOW (ref 4.22–5.81)
RDW: 17.1 % — ABNORMAL HIGH (ref 11.5–15.5)
WBC: 7.5 10*3/uL (ref 4.0–10.5)
nRBC: 0 % (ref 0.0–0.2)

## 2019-06-08 LAB — GLUCOSE, CAPILLARY
Glucose-Capillary: 76 mg/dL (ref 70–99)
Glucose-Capillary: 145 mg/dL — ABNORMAL HIGH (ref 70–99)
Glucose-Capillary: 188 mg/dL — ABNORMAL HIGH (ref 70–99)
Glucose-Capillary: 116 mg/dL — ABNORMAL HIGH (ref 70–99)

## 2019-06-08 LAB — PROTIME-INR
INR: 2 — ABNORMAL HIGH (ref 0.8–1.2)
Prothrombin Time: 21.6 seconds — ABNORMAL HIGH (ref 11.4–15.2)

## 2019-06-08 MED ORDER — HEPARIN (PORCINE) 25000 UT/250ML-% IV SOLN
1650.0000 [IU]/h | INTRAVENOUS | Status: DC
  Filled 2019-06-08: qty 250

## 2019-06-08 MED ORDER — WARFARIN SODIUM 3 MG PO TABS
3.0000 mg | ORAL_TABLET | Freq: Once | ORAL | Status: AC
  Administered 2019-06-08: 3 mg via ORAL
  Filled 2019-06-08: qty 1

## 2019-06-08 MED ORDER — HEPARIN SODIUM (PORCINE) 1000 UNIT/ML IJ SOLN
INTRAMUSCULAR | Status: AC
  Administered 2019-06-08: 3200 [IU]
  Filled 2019-06-08: qty 3

## 2019-06-08 MED ORDER — LORATADINE 10 MG PO TABS
10.0000 mg | ORAL_TABLET | Freq: Every day | ORAL | Status: DC
  Administered 2019-06-08 – 2019-06-17 (×10): 10 mg via ORAL
  Filled 2019-06-08 (×10): qty 1
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
## 2019-06-08 NOTE — Progress Notes (Signed)
Physical Therapy Session Note  Patient Details  Name: Max Pittman. MRN: [id number] Date of Birth: 10-Jan-1976  Today's Date: 06/08/2019 PT Individual Time: 0955-1050 PT Individual Time: 55 min  Short Term Goals: Week 1:  PT Short Term Goal 1 (Week 1): Pt will perform supine<>sit with limited use of bed features and mod assist PT Short Term Goal 2 (Week 1): Pt will perform sit<>stand with CGA PT Short Term Goal 3 (Week 1): Pt will perform bed<>chair transfers with CGA PT Short Term Goal 4 (Week 1): Pt will ambulate at least 25ft with min assist PT Short Term Goal 5 (Week 1): Pt will initiate stair navigation  Skilled Therapeutic Interventions/Progress Updates:     Session 1: Patient in bed upon PT arrival. Patient reported 9/10 headache and L hip pain during session, RN made aware. PT provided repositioning, rest breaks, and distraction as pain interventions throughout session. Patient reports that he usually takes medication for his sinuses this time of year, RN made aware. Focused session on bed mobility and patient education due to patient refusing to transfer OOB due to pain. PT donned B TED hose and non-skid socks with max-total A with patient in supine for energy conservation and pain management. RN came in to exchange IV bag. PT educated about purpose of Heprin drip and patient's increased risk for blood clots.  Patient was able to recall 3/3 hip precautions this session without cues.   Therapeutic Activity: Bed Mobility: Patient performed supine to/from sit with min A-CGA using a leg lifter in a flat bed with min use of bed rails. Provided verbal cues for bringing LEs off the bed in small movements and pushing through his elbows then his hands to sit up, and cues for use of leg lifter for L LE to bring LEs onto the bed in long sitting prior to lowering his trunk onto the bed. Patient required significantly increased time to complete mobility, as he would attempt alternative  methods that PT would have to stop due to potential for breaking precautions. He performed scooting EOB with min A x1 and supervision x2, provided cues for controlled movement to avoid hurting his hip. Also, performed scooting up in bed with min A with bed in trendelenburg and pushing through R LE.  Patient sat EOB x15 min, educated him to lean forward with a warm compress on his face to drain his sinuses. Patient blew his nose, PT provided a pillow for splinting his ribs during and educated about using the pillow when coughing, sneezing, blowing his nose, and laughing to reduce rib pain. Patient used the flutter valve sitting EOB with min cues for technique. Educated patient on purpose and benefit of flutter valve and encouraged him to perform this throughout the day. Also, discussed positioning in bed and edema control, patient reports that he does not like ice on his hip, instructed him to lie in bed with his LEs elevated on pillows to reduce swelling.   Patient in bed with LEs elevated at end of session with breaks locked, bed alarm set, and all needs within reach. Patient declined performing bed level exercises or other therapy interventions once he was back in bed due to his headache. Patient missed 20 min of skilled PT due to pain, RN made aware. Will attempt to make-up missed time as able.     Therapy Documentation Precautions:  Precautions Precautions: Fall, Posterior Hip Precaution Booklet Issued: No Restrictions Weight Bearing Restrictions: Yes LLE Weight Bearing: Non weight bearing General:  PT Amount of Missed Time (min): 20 Minutes PT Missed Treatment Reason: Unavailable (Comment)(HD)    Therapy/Group: Individual Therapy  Allah Reason L Jeffie Widdowson PT, DPT  06/08/2019, 3:47 PM

## 2019-06-08 NOTE — Progress Notes (Signed)
Renal Navigator provided update to OP HD clinic of estimated discharge date based on CSW notes, and asked to be given exact MWF chair time to provide to patient. Navigator will follow up.  Alphonzo Cruise, [place] Renal Navigator [phone]

## 2019-06-08 NOTE — Plan of Care (Signed)
  Problem: Consults Goal: RH STROKE PATIENT EDUCATION Description: See Patient Education module for education specifics  Outcome: Progressing Goal: Nutrition Consult-if indicated Outcome: Progressing Goal: Diabetes Guidelines if Diabetic/Glucose > 140 Description: If diabetic or lab glucose is > 140 mg/dl - Initiate Diabetes/Hyperglycemia Guidelines & Document Interventions  Outcome: Progressing   Problem: RH BOWEL ELIMINATION Goal: RH STG MANAGE BOWEL WITH ASSISTANCE Description: STG Manage Bowel with Moderate Assistance. Outcome: Progressing   Problem: RH BLADDER ELIMINATION Goal: RH STG MANAGE BLADDER WITH ASSISTANCE Description: STG Manage Bladder With Moderate Assistance Outcome: Progressing   Problem: RH SKIN INTEGRITY Goal: RH STG SKIN FREE OF INFECTION/BREAKDOWN Description: Patient will verbalize ways to prevent skin breakdown by discharge. Outcome: Progressing Goal: RH STG MAINTAIN SKIN INTEGRITY WITH ASSISTANCE Description: STG Maintain Skin Integrity With Moderate Assistance. Outcome: Progressing Goal: RH STG ABLE TO PERFORM INCISION/WOUND CARE W/ASSISTANCE Description: STG Able To Perform Incision/Wound Care With Moderate Assistance. Outcome: Progressing   Problem: RH SAFETY Goal: RH STG ADHERE TO SAFETY PRECAUTIONS W/ASSISTANCE/DEVICE Description: STG Adhere to Safety Precautions With Moderate Assistance/Device. Outcome: Progressing   Problem: RH PAIN MANAGEMENT Goal: RH STG PAIN MANAGED AT OR BELOW PT'S PAIN GOAL Outcome: Progressing   Problem: RH KNOWLEDGE DEFICIT Goal: RH STG INCREASE KNOWLEDGE OF DIABETES Description: Patient will verbalize ways to control and lower blood sugar while in the hospital. Outcome: Progressing Goal: RH STG INCREASE KNOWLEDGE OF HYPERTENSION Description: Patient will verbalize ways to prevent high blood pressure in his daily life. Outcome: Progressing Goal: RH STG INCREASE KNOWLEDGE OF DYSPHAGIA/FLUID INTAKE Description:  Patient will increase his food intake and remain free from aspiration while in the hospital. Outcome: Progressing Goal: RH STG INCREASE KNOWLEGDE OF HYPERLIPIDEMIA Description: Patient will verbalize ways to manage high cholesterol levels by discharge. Outcome: Progressing Goal: RH STG INCREASE KNOWLEDGE OF STROKE PROPHYLAXIS Description: Patient will verbalize signs and symptoms of stroke by discharge.  Outcome: Progressing
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
## 2019-06-08 NOTE — Progress Notes (Signed)
Occupational Therapy Session Note  Patient Details  Name: Max Pittman. MRN: [id number] Date of Birth: [birth year]  Today's Date: 06/08/2019 OT Individual Time: 1093-2355 OT Individual Time Calculation (min): 72 min    Short Term Goals: Week 1:  OT Short Term Goal 1 (Week 1): Pt will complete 1 UB self care task EOB with pain level reportedly manageable OT Short Term Goal 2 (Week 1): Pt will complete BSC or toilet transfer with LRAD and 1 assist OT Short Term Goal 3 (Week 1): Pt will thread both LEs into pants with Min A using AE as needed  Skilled Therapeutic Interventions/Progress Updates:    Pt greeted seated EOB and agreeable to OT treatment session. Pt reported he had already eaten and was ready for therapy. Pt less anxious about moving today and pain overall more under control. OT reviewed hip precautions and TDWB prior to transfer. Pt needed min A for sit<>stand from bed, then CGA for pivot to wc with RW. Bathing/dressing completed at the sink with pt able to complete UB bathing/dressing with nursing to thread IV through shirt. Worked on standing balance/edurnace with standing at the sink to wash buttocks-CGA for balance and verbal cues for TDWB LLE. Pt needed OT assistance to thread LLE into pants 2/2 no reacher available. Pt was then able to thread R LE into pants without reacher and able to maintain hip precautions. Pt propelled wc to therapy apartment and reviewed tub bench transfer with pt. Pt ambulated into bathroom with RW and mostly maintaining TDWB, but gets in a hurry and has a tendency to put too much weight through LLE. Cues to slow down and focus on his movements and not rushing through transfer. Pt needed OT assist to lift LLE in and out of tub. Pt reported nee to go to the bathroom and returned to room by propelling wc. OT placed higher BSC over toilet and pt ambulated into bathroom min A w/ RW and cues for TDWB. Pt unable to have a BM. Similar transfer out of bathroom. Pt  left seated in wc at end of session with nursing and MD present to assess pt.   Therapy Documentation Precautions:  Precautions Precautions: Fall, Posterior Hip Precaution Booklet Issued: No Restrictions Weight Bearing Restrictions: Yes LLE Weight Bearing: Non weight bearing General: General PT Missed Treatment Reason: Pain Vital Signs: Therapy Vitals Temp: 98.6 F (37 C) Temp Source: Oral Pulse Rate: 90 Resp: 18 BP: 107/61 Patient Position (if appropriate): Lying Oxygen Therapy SpO2: 95 % O2 Device: Room Air Pain:  Pt reports pain to LLE. No pain number given, but nursing administered medication. Rest and repositioned for comfort.  ADL: ADL Eating: Not assessed Grooming: Setup Where Assessed-Grooming: Bed level Upper Body Bathing: Not assessed Lower Body Bathing: Not assessed Upper Body Dressing: Maximal assistance Where Assessed-Upper Body Dressing: Bed level Lower Body Dressing: Maximal assistance Where Assessed-Lower Body Dressing: Bed level Toileting: Not assessed Toilet Transfer: Not assessed Tub/Shower Transfer: Not assessed ADL Comments: Eval significantly limited due to pts increased pain  Therapy/Group: Individual Therapy  Valma Cava 06/08/2019, 12:58 PM
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
## 2019-06-08 NOTE — Progress Notes (Addendum)
Attempted scheduled PT session with patient. Per RN, patient is off the unit for HD that was rescheduled from yesterday. Patient missed 45 min of skilled PT due to being off unit, RN made aware. Will attempt to make-up missed time as able.     Apolinar Junes PT, DPT

## 2019-06-09 ENCOUNTER — Inpatient Hospital Stay (HOSPITAL_COMMUNITY): Payer: Medicare Other | Admitting: Occupational Therapy

## 2019-06-09 ENCOUNTER — Inpatient Hospital Stay (HOSPITAL_COMMUNITY): Payer: Medicare Other

## 2019-06-09 LAB — CBC
HCT: 26.9 % — ABNORMAL LOW (ref 39.0–52.0)
Hemoglobin: 8.1 g/dL — ABNORMAL LOW (ref 13.0–17.0)
MCH: 29.6 pg (ref 26.0–34.0)
MCHC: 30.1 g/dL (ref 30.0–36.0)
MCV: 98.2 fL (ref 80.0–100.0)
Platelets: 229 10*3/uL (ref 150–400)
RBC: 2.74 MIL/uL — ABNORMAL LOW (ref 4.22–5.81)
RDW: 17 % — ABNORMAL HIGH (ref 11.5–15.5)
WBC: 6.3 10*3/uL (ref 4.0–10.5)
nRBC: 0 % (ref 0.0–0.2)

## 2019-06-09 LAB — RENAL FUNCTION PANEL
Albumin: 1.8 g/dL — ABNORMAL LOW (ref 3.5–5.0)
Anion gap: 10 (ref 5–15)
BUN: 27 mg/dL — ABNORMAL HIGH (ref 6–20)
CO2: 29 mmol/L (ref 22–32)
Calcium: 8.5 mg/dL — ABNORMAL LOW (ref 8.9–10.3)
Chloride: 95 mmol/L — ABNORMAL LOW (ref 98–111)
Creatinine, Ser: 5.45 mg/dL — ABNORMAL HIGH (ref 0.61–1.24)
GFR calc Af Amer: 14 mL/min — ABNORMAL LOW (ref >60)
GFR calc non Af Amer: 12 mL/min — ABNORMAL LOW (ref >60)
Glucose, Bld: 189 mg/dL — ABNORMAL HIGH (ref 70–99)
Phosphorus: 3 mg/dL (ref 2.5–4.6)
Potassium: 4.9 mmol/L (ref 3.5–5.1)
Sodium: 134 mmol/L — ABNORMAL LOW (ref 135–145)

## 2019-06-09 LAB — GLUCOSE, CAPILLARY
Glucose-Capillary: 168 mg/dL — ABNORMAL HIGH (ref 70–99)
Glucose-Capillary: 122 mg/dL — ABNORMAL HIGH (ref 70–99)
Glucose-Capillary: 109 mg/dL — ABNORMAL HIGH (ref 70–99)
Glucose-Capillary: 174 mg/dL — ABNORMAL HIGH (ref 70–99)

## 2019-06-09 LAB — PROTIME-INR
INR: 1.9 — ABNORMAL HIGH (ref 0.8–1.2)
Prothrombin Time: 21.2 seconds — ABNORMAL HIGH (ref 11.4–15.2)

## 2019-06-09 MED ORDER — WARFARIN SODIUM 5 MG PO TABS
5.0000 mg | ORAL_TABLET | Freq: Once | ORAL | Status: AC
  Administered 2019-06-09: 5 mg via ORAL
  Filled 2019-06-09: qty 1

## 2019-06-09 MED ORDER — TRAZODONE HCL 50 MG PO TABS
50.0000 mg | ORAL_TABLET | Freq: Every evening | ORAL | Status: DC | PRN
  Administered 2019-06-09 – 2019-06-14 (×6): 50 mg via ORAL
  Filled 2019-06-09 (×5): qty 1

## 2019-06-09 MED ORDER — MELATONIN 5 MG PO TABS
5.0000 mg | ORAL_TABLET | Freq: Every day | ORAL | Status: DC
  Administered 2019-06-09 – 2019-06-16 (×8): 5 mg via ORAL
  Filled 2019-06-09 (×8): qty 1

## 2019-06-09 MED ORDER — HEPARIN SODIUM (PORCINE) 1000 UNIT/ML IJ SOLN
INTRAMUSCULAR | Status: AC
  Administered 2019-06-09: 3200 [IU] via INTRAVENOUS_CENTRAL
  Filled 2019-06-09: qty 4

## 2019-06-09 MED ORDER — PIOGLITAZONE HCL 15 MG PO TABS
15.0000 mg | ORAL_TABLET | Freq: Every day | ORAL | Status: DC
  Administered 2019-06-09 – 2019-06-17 (×9): 15 mg via ORAL
  Filled 2019-06-09 (×10): qty 1

## 2019-06-09 NOTE — Progress Notes (Signed)
Patient to hemodialysis  

## 2019-06-09 NOTE — Progress Notes (Signed)
Patient has been given an 8:00am seat time at [facility] for HD. Navigator will inform patient and CIR CSW.  Alphonzo Cruise, [place] Renal Navigator [phone]

## 2019-06-09 NOTE — Progress Notes (Addendum)
Occupational Therapy Session Note  Patient Details  Name: Max Pittman. MRN: [id number] Date of Birth: March 26, 1975  Today's Date: 06/09/2019 OT Individual Time: 0727-820 Total individual time=53 minutes Missed 7 minutes (fatigued and pain)    Short Term Goals: Week 1:  OT Short Term Goal 1 (Week 1): Pt will complete 1 UB self care task EOB with pain level reportedly manageable OT Short Term Goal 2 (Week 1): Pt will complete BSC or toilet transfer with LRAD and 1 assist OT Short Term Goal 3 (Week 1): Pt will thread both LEs into pants with Min A using AE as needed  Skilled Therapeutic Interventions/Progress Updates: pt c/o 8/10 constant pain in L LE throughout.  RN had given pain meds approxinately 5 minutes prior.   Otherwise focus of session was as follows:       Close SSit to stand from bed in preparation for functional mobility and self care standing and then sitting at sink Dynamic standing with TDWB to left leg for short periods (about 3 seconds a try.   C/o increased pain with standing)= close S UB bathing and dressing=set up LB bathing and drssing= Min A (no reacher was located this am to practice with) for donning items over left foot and to maintain hip precautions.   Was able to stand at sink to complete peribathing with S  Other functional transfer practice via walker= close S.    Patient able to maintain TDWB L LE When offered therapy to make up the 7 minutes after resting 45 after his prior session, he stated he was too fatigued to do so  -Continue OT POC   Therapy Documentation Precautions:  Precautions Precautions: Fall, Posterior Hip Precaution Booklet Issued: No Restrictions Weight Bearing Restrictions: Yes LLE Weight Bearing: Non weight bearing  Pain: see above note     Therapy/Group: Individual Therapy  Alfredia Ferguson [facility] 06/09/2019, 9:09 AM
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
## 2019-06-09 NOTE — Progress Notes (Signed)
Navigator is in communication with CIR CSW to discuss transportation barriers. Navigator has requested that OP HD clinic make referral to [facility] to place patient on waiting list for transportation. We will continue to explore possible options.  Alphonzo Cruise, [place] [facility] Navigator [phone]

## 2019-06-09 NOTE — Progress Notes (Signed)
Patient ID: Max Pittman., male   DOB: [birth year], 44 y.o.   MRN: [id number]   SW received updates from [facility] who reported that is currently on HD M/W/F[name] with seat time of 8am. Pt has expressed concerns to switch to PD due to concerns related to transportation. Waiting for nephrologist to determine if pt can be transitioned to PD.   SW called [facility] [phone]) to inquire about transportation options as SW explained pt being on dialysis. Reports pt will need to have dialysis center fax over referral. Current waitlist is quite long at this time. Discussed with Dialysis Coord above, and she will follow-up with center about sending referral. Reprots pt is opting to use [facility] in the interim for transportation.   Loralee Pacas, MSW, [place] Office: [phone] Cell: [phone] Fax: [phone]

## 2019-06-09 NOTE — Progress Notes (Signed)
Occupational Therapy Weekly Progress Note  Patient Details  Name: Max Pittman. MRN: [id number] Date of Birth: 05/07/1975  Beginning of progress report period: Jun 03, 2019 End of progress report period: Jun 09, 2019  Today's Date: 06/09/2019 OT Individual Time: 1624-4695 OT Individual Time Calculation (min): 60 min    Patient has met 3 of 3 short term goals.  Pt is making steady progress towards OT goals. Pt is at an overall min A level for BADL tasks and is doing better about recalling hip precautions and maintaining them within functional tasks and ambulation. Continue current POC.   Patient continues to demonstrate the following deficits: muscle weakness, decreased cardiorespiratoy endurance and decreased standing balance, decreased balance strategies and difficulty maintaining precautions and therefore will continue to benefit from skilled OT intervention to enhance overall performance with BADL.  Patient progressing toward long term goals..  Continue plan of care.  OT Short Term Goals Week 1:  OT Short Term Goal 1 (Week 1): Pt will complete 1 UB self care task EOB with pain level reportedly manageable OT Short Term Goal 1 - Progress (Week 1): Met OT Short Term Goal 2 (Week 1): Pt will complete BSC or toilet transfer with LRAD and 1 assist OT Short Term Goal 2 - Progress (Week 1): Met OT Short Term Goal 3 (Week 1): Pt will thread both LEs into pants with Min A using AE as needed OT Short Term Goal 3 - Progress (Week 1): Met Week 2:  OT Short Term Goal 1 (Week 2): LTG=STG 2/2 ELOS  Skilled Therapeutic Interventions/Progress Updates:    Pt greeted sitting in recliner and agreeable to OT treatment session. Pt requesting pain medications and nursing notified. OT walked to the other side of room to lock wc in preparation for ambulation to wc. Pt began to stand up without RW. OT had pt sit back down and asked what his plan was. Pt stated he would use the rolling tray table and  straight back chair near him to get to his RW which was 3 feet away. OT discussed this decision with patient, pointing out that the table rolls and chair also moves. Pt realized this was not a safe idea. OT took learning opportunity to educate further on high risk of falls and way to reduce falls risk. Pt stood from recliner with RW and CGA with verbal cues for TDWB LLE. Pt ambulated 10 feet to wc on other side of room w/ CGA and cues to slow down and focus on hopping steps instead of rushing through movement. Pt propelled wc to therapy gym and worked on standing balance/endurance with standing dynavision activity. Had pt alternate UEs to press light buttons. Pt needed close supervision/CGA for balance. UB there-ex with SciFit arm bike on level 4.5 for 2, 5 minute intervals. Pt propelled wc back to room and was left seated in wc with alarm belt on, call bell in reach, and needs met.   Therapy Documentation Precautions:  Precautions Precautions: Fall, Posterior Hip Precaution Booklet Issued: No Restrictions Weight Bearing Restrictions: Yes LLE Weight Bearing: Non weight bearing Pain:    Therapy/Group: Individual Therapy  Valma Cava 06/09/2019, 1:27 PM
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
## 2019-06-09 NOTE — Progress Notes (Signed)
Physical Therapy Session Note  Patient Details  Name: Max Pittman. MRN: [id number] Date of Birth: 1975/02/01  Today's Date: 06/09/2019 PT Individual Time: 1025-1125 PT Individual Time Calculation (min): 60 min   Short Term Goals: Week 1:  PT Short Term Goal 1 (Week 1): Pt will perform supine<>sit with limited use of bed features and mod assist PT Short Term Goal 2 (Week 1): Pt will perform sit<>stand with CGA PT Short Term Goal 3 (Week 1): Pt will perform bed<>chair transfers with CGA PT Short Term Goal 4 (Week 1): Pt will ambulate at least 48ft with min assist PT Short Term Goal 5 (Week 1): Pt will initiate stair navigation Week 2:    Week 3:     Skilled Therapeutic Interventions/Progress Updates:    PAIN 7/10 L hip/groin area to ribcage, treatment to tolerance, repositioned as needed.  Pt initially oob in wc and agreeable to treatment.  wc propulsion x 159ft mod I to gym including turns/backing/parking STS w/cga and cues for LLE positioining to prevent wbing. Gait 84ft w/RW , cga,TDWBing LLE, maintained well today w/gait, requires cues w/transitions STS.  Seated LAQx 2x10 LLE Sit to supine w/min Assist for LE management.  Supine therex: Hip abd/add w/sliding board and pillowcase to reduce friction x 15 Clamshells to/from midline LLE x 20 w/progressively increasing ROM TKEs x 20 Heel slides x 15 Pt able to recall 3/3 hip precautions  Supine to sit w/mod assist of 1  STS w/cues for TDWBing w/RW, cga  Gait 64ft w/cga, one mild LOB w/min assist for recovery, cues for TDWBing, RW.  Turn/sit to wc w/increased cues required to maintain TDWbing/positioining of LLE w/transition.  Pt propels wc mod I to main gym for standing alance/tolerance activity. Sit to stand w/RW, cues for TDWB, CGA Performed horseshoe toss by retrieveing horeshoes located to pt R and tossing to target at 77ft, performed sets of 6 w/2 min rest between efforts and therapist manually moniforting LLE due  to inconsistent wbing/to ensure touchdown maintained w/activitiy and w/STS.  Pt propels wc to room mod I.  Pt requires max cues for safe set up w/transfer to bed, initially facing bed "head on" and stating that he would pivot 180* to bed.  Discussed safety and proper set up directed by therapist for lateral scoot transfer to bed to R side/TDWB L and pt performed w/cga/set up, cues.  Pt left sitting on edge of bed w/bed alarm set, tray in front of pt, and all needs in reach.  Therapy Documentation Precautions:  Precautions Precautions: Fall, Posterior Hip Precaution Booklet Issued: No Restrictions Weight Bearing Restrictions: Yes LLE Weight Bearing: Non weight bearing   Therapy/Group: Individual Therapy  Callie Fielding, [place] 06/09/2019, 12:50 PM
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
## 2019-06-10 ENCOUNTER — Inpatient Hospital Stay (HOSPITAL_COMMUNITY): Payer: Medicare Other | Admitting: Occupational Therapy

## 2019-06-10 LAB — CBC
HCT: 26.8 % — ABNORMAL LOW (ref 39.0–52.0)
Hemoglobin: 8 g/dL — ABNORMAL LOW (ref 13.0–17.0)
MCH: 29.7 pg (ref 26.0–34.0)
MCHC: 29.9 g/dL — ABNORMAL LOW (ref 30.0–36.0)
MCV: 99.6 fL (ref 80.0–100.0)
Platelets: 227 10*3/uL (ref 150–400)
RBC: 2.69 MIL/uL — ABNORMAL LOW (ref 4.22–5.81)
RDW: 16.5 % — ABNORMAL HIGH (ref 11.5–15.5)
WBC: 5.3 10*3/uL (ref 4.0–10.5)
nRBC: 0 % (ref 0.0–0.2)

## 2019-06-10 LAB — PROTIME-INR
INR: 1.9 — ABNORMAL HIGH (ref 0.8–1.2)
Prothrombin Time: 20.9 seconds — ABNORMAL HIGH (ref 11.4–15.2)

## 2019-06-10 LAB — GLUCOSE, CAPILLARY
Glucose-Capillary: 91 mg/dL (ref 70–99)
Glucose-Capillary: 123 mg/dL — ABNORMAL HIGH (ref 70–99)
Glucose-Capillary: 106 mg/dL — ABNORMAL HIGH (ref 70–99)
Glucose-Capillary: 132 mg/dL — ABNORMAL HIGH (ref 70–99)

## 2019-06-10 MED ORDER — NEPRO/CARBSTEADY PO LIQD
237.0000 mL | Freq: Three times a day (TID) | ORAL | Status: DC
  Administered 2019-06-10 – 2019-06-16 (×20): 237 mL via ORAL

## 2019-06-10 MED ORDER — WARFARIN SODIUM 5 MG PO TABS
5.0000 mg | ORAL_TABLET | Freq: Once | ORAL | Status: AC
  Administered 2019-06-10: 5 mg via ORAL
  Filled 2019-06-10: qty 1

## 2019-06-10 MED ORDER — PRO-STAT SUGAR FREE PO LIQD
30.0000 mL | Freq: Two times a day (BID) | ORAL | Status: DC
  Administered 2019-06-11 – 2019-06-17 (×6): 30 mL via ORAL
  Filled 2019-06-10 (×10): qty 30
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
## 2019-06-10 NOTE — Progress Notes (Signed)
Occupational Therapy Session Note  Patient Details  Name: Max Pittman. MRN: [id number] Date of Birth: Jul 20, 1975  Today's Date: 06/10/2019 OT Individual Time: 3953-2023 OT Individual Time Calculation (min): 58 min   Short Term Goals: Week 2:  OT Short Term Goal 1 (Week 2): LTG=STG 2/2 ELOS  Skilled Therapeutic Interventions/Progress Updates:    Pt greeted semi-reclined in bed and agreeable to OT treatment session. Pt reported he slept better last night and nursing entered to administer pain medications. Worked on bed mobility from flatter surface, but pt only tolerated HOB down to 30 degrees. Educated on using leg lifter with knee locked to help advance LLE off of bed. Pt with more success with this technique. Pt completed UB bathing at EOB, but declined any LB bathing. Pt donned socks with sock-aid, then was able to thread pant legs using reacher. Sit<>stand from EOB with CGA, then CGA to maintain balance to pull up pants. Pt took small hops over to wc. UB strength/endurance with wc propulsion. Pt propelled wc to [facility] elevators with 1 rest break. Worked on dynamic standing balance and standing endurance with Wii bowling activity. Pt tolerated standing for 3, 2.5 minute intervals and CGA w/ RW when swinging arm outside of base of support. Worked on L quad activation with seated toe tap on small cone. Pt with little hip extension requiring OT assist to get foot up to cone. Pt returned to room and was left seated in wc with alarm belt on, call bell in reach, and breakfast tray present.   Therapy Documentation Precautions:  Precautions Precautions: Fall, Posterior Hip Precaution Booklet Issued: No Restrictions Weight Bearing Restrictions: Yes LLE Weight Bearing: Non weight bearing Pain: Pain Assessment Pain Scale: 0-10 Pain Score: 7 Pain Type: Acute pain Pain Location: Leg Pain Orientation: Left Pain Descriptors / Indicators: Aching Pain Frequency: Intermittent Patients Stated  Pain Goal: 2 Pain Intervention(s): Repositioned, nursing administered pain meds   Therapy/Group: Individual Therapy  Valma Cava 06/10/2019, 8:44 AM
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
## 2019-06-11 ENCOUNTER — Inpatient Hospital Stay (HOSPITAL_COMMUNITY): Payer: Medicare Other

## 2019-06-11 LAB — CBC
HCT: 25.8 % — ABNORMAL LOW (ref 39.0–52.0)
Hemoglobin: 7.6 g/dL — ABNORMAL LOW (ref 13.0–17.0)
MCH: 29.2 pg (ref 26.0–34.0)
MCHC: 29.5 g/dL — ABNORMAL LOW (ref 30.0–36.0)
MCV: 99.2 fL (ref 80.0–100.0)
Platelets: 220 10*3/uL (ref 150–400)
RBC: 2.6 MIL/uL — ABNORMAL LOW (ref 4.22–5.81)
RDW: 16.3 % — ABNORMAL HIGH (ref 11.5–15.5)
WBC: 5.8 10*3/uL (ref 4.0–10.5)
nRBC: 0 % (ref 0.0–0.2)

## 2019-06-11 LAB — GLUCOSE, CAPILLARY
Glucose-Capillary: 146 mg/dL — ABNORMAL HIGH (ref 70–99)
Glucose-Capillary: 108 mg/dL — ABNORMAL HIGH (ref 70–99)
Glucose-Capillary: 124 mg/dL — ABNORMAL HIGH (ref 70–99)
Glucose-Capillary: 113 mg/dL — ABNORMAL HIGH (ref 70–99)

## 2019-06-11 LAB — PROTIME-INR
INR: 2.3 — ABNORMAL HIGH (ref 0.8–1.2)
Prothrombin Time: 24.2 seconds — ABNORMAL HIGH (ref 11.4–15.2)

## 2019-06-11 MED ORDER — HEPARIN 1000 UNIT/ML FOR PERITONEAL DIALYSIS
500.0000 [IU] | INTRAMUSCULAR | Status: DC | PRN
  Filled 2019-06-11: qty 0.5

## 2019-06-11 MED ORDER — WARFARIN SODIUM 5 MG PO TABS
5.0000 mg | ORAL_TABLET | Freq: Once | ORAL | Status: AC
  Administered 2019-06-11: 5 mg via ORAL
  Filled 2019-06-11: qty 1

## 2019-06-11 MED ORDER — GENTAMICIN SULFATE 0.1 % EX CREA
1.0000 "application " | TOPICAL_CREAM | Freq: Every day | CUTANEOUS | Status: DC
  Administered 2019-06-11 – 2019-06-12 (×2): 1 via TOPICAL
  Filled 2019-06-11: qty 15

## 2019-06-11 MED ORDER — DARBEPOETIN ALFA 200 MCG/0.4ML IJ SOSY
200.0000 ug | PREFILLED_SYRINGE | INTRAMUSCULAR | Status: DC
  Administered 2019-06-11: 200 ug via SUBCUTANEOUS
  Filled 2019-06-11 (×2): qty 0.4

## 2019-06-11 NOTE — Plan of Care (Signed)
  Problem: RH Ambulation Goal: LTG Patient will ambulate in controlled environment (PT) Description: LTG: Patient will ambulate in a controlled environment, # of feet with assistance (PT). Flowsheets (Taken 06/11/2019 0545) LTG: Pt will ambulate in controlled environ  assist needed:: (Downgraded goal due to slow progress secondary to increased pain with ambulation.) Supervision/Verbal cueing LTG: Ambulation distance in controlled environment: 20 ft using LRAD while maintaining L TDWB Note: Downgraded goal due to slow progress secondary to increased pain with ambulation. Goal: LTG Patient will ambulate in home environment (PT) Description: LTG: Patient will ambulate in home environment, # of feet with assistance (PT). Flowsheets (Taken 06/11/2019 0545) LTG: Pt will ambulate in home environ  assist needed:: Supervision/Verbal cueing LTG: Ambulation distance in home environment: (Downgraded goal due to slow progress secondary to increased pain with ambulation.) 20 ft using LRAD maintaining L TDWB Note: Downgraded goal due to slow progress secondary to increased pain with ambulation.   Problem: RH Stairs Goal: LTG Patient will ambulate up and down stairs w/assist (PT) Description: LTG: Patient will ambulate up and down # of stairs with assistance (PT) Outcome: Not Applicable Flowsheets (Taken 06/11/2019 0545) LTG: Pt will ambulate up/down stairs assist needed:: (D/c goal due to lack of progress and ramp being built prior to d/c.) -- Note: D/c goal due to lack of progress and ramp being built prior to d/c.
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
## 2019-06-11 NOTE — Progress Notes (Addendum)
Physical Therapy Weekly Progress Note  Patient Details  Name: Max Pittman. MRN: [id number] Date of Birth: [birth year]  Beginning of progress report period: Jun 03, 2019 End of progress report period: Jun 11, 2019  Today's Date: 06/11/2019 PT Individual Time: 0800-0906, 1300-1330, and 1340-1350 PT Individual Time Calculation (min): 66 min, 30 min, and 10 min   Patient has met 3 of 5 short term goals.  Patient has has slow, but steady progress this week, limited by pain and changes in HD schedule. He currently requires min A for bed mobility with min use of bed rails and a leg lifter to complete mobility, CGA for lateral scoot, sit to stand, and stand pivot transfers with a RW, he has ambulated up to 17 feet using the RW while maintaining L LE TDWB precautions, he is limited by pain with gait training and LTGs have been downgraded due to decreased standing tolerance. Patient now plans to have a ramp put in at his home due to his pain level with ambulation. Have discharged stair goals due to change in home entry and lack of progress toward goals.  Patient continues to demonstrate the following deficits muscle weakness and muscle joint tightness, decreased cardiorespiratoy endurance, decreased memory and decreased sitting balance, decreased standing balance, decreased postural control, decreased balance strategies and difficulty maintaining precautions and therefore will continue to benefit from skilled PT intervention to increase functional independence with mobility.  Patient Progressing toward most LTGs, downgraded gait and discharged stair goals, see goal revisions..  Continue plan of care.  PT Short Term Goals Week 2:  PT Short Term Goal 1 (Week 2): STG=LTG due to ELOS.  Skilled Therapeutic Interventions/Progress Updates:     Session 1: Patient in bed asleep upon PT arrival. Patient aroused to verbal and tactile stimulation and agreeable to PT session. Patient reported 8/10 L hip pain  during session, RN made aware. PT provided repositioning, rest breaks, and distraction as pain interventions throughout session. Focused session on d/c planning and household mobility with simulated home set-up.  Patient's breakfast try untouched and patient requested to eat breakfast prior to starting mobility training. Patient sat EOB, see details below, discussed POC, PT goals, d/c planning, and family education while patient ate breakfast. Reoprts a ramp is nearly done being built at his home. Patient's wife is having difficulty scheduling family education due to not having a vehicle for transportation. Disucussed possibility of phone or video call education if she is unable to come in person prior to d/c.  Therapeutic Activity: Bed Mobility: Patient performed supine to/from sit with min A-supervision for trunk support in a flat hospital bed without use of bed rails and CGA-supervision for safety in the ADL bed with use of a leg lifter for management of L LE. Provided verbal cues for sliding LEs off the bed then pushing through his elbows to sit up. Patient donned paper scrub pants while sitting EOB, performed lateral leans to pull pants up over hips. Required supervision for cues to maintain L hip precautions while leaning. Transfers: Patient performed sit to/from stand form the w/c and ALD bed and stand pivot bed<>w/c with CGA-close supervision. Provided verbal cues for placing L foot ahead to assist with maintaining TDWB and controlled descent for safety and pain management.  Gait Training:  Patient ambulated 8 feet x2 in the ADL apartment over carpet to simulate home set-up using RW with CGA. Ambulated with hop-to gait pattern on R, maintained NWB-TDWB throughout without cues this session. Provided verbal  cues for increased use of UEs and back muscles to control impact on R foot and taking his time for safety and improved control.  Wheelchair Mobility:  Patient propelled wheelchair ~75 feet x2  with mod I. Patient required min cues for doffing leg rests and required max A-total A for demonstration for donning leg rests. He required min cues for appropriate use of breaks throughout session.  Patient continues to require increased time and rest breaks with all mobility due to pain and decreased activity tolerance. Discussed home set-up during rest breaks, patient has a large recliner that he will be spending most of his time in at home. Patient is going to have his wife measure the chair height. Educated on use of cushions to elevate chair height if necessary to maintain hip precautions during sit to/form stands and increased ease of standing transfers.   Patient in bed at end of session with breaks locked, bed alarm set, and all needs within reach.   Session 2 and 3: Patient sitting EOB with his wife in the room upon PT arrival. Patient alert and agreeable to PT session. Patient denied pain during session. Discussed d/c planning, PT goals of supervision with short gait distances and mod I w/c mobility, and home measurements with patient and his wife. Patient's home will not accomodate a 22"x18" w/c, but will accommodate a 20"x18" w/c. W/c is tight at his hips, educated on patient not spending prolonged periods in the w/c to maintain skin integrity. Reports that patient will have a ride at d/c on Saturday, but his wife will be unable to come in for family education. Planning on video call for family education on Thursday, waiting to confirm time, will let OT and CSW know when time is set. Also, educated patient's wife of patient's weight bearing status, with demonstration for gait and standing, and hip precautions use teach back method with patient.   Patient and his wife requested a few minutes alone before she left. PT stepped out of the room 10 min before returning to complete session. Second half of session was focused on w/c mobility for ramp training and management of w/c  parts.  Therapeutic Activity: Transfers: Patient performed stand pivot transfer bed<>w/c with CGA-close supervision as above.   Wheelchair Mobility:  Patient propelled wheelchair 30 feet x2 with mod I and up/down a ramp x2 with CGA, ascending backwards and descending forwards with use of B UEs and R foot for propulsion/controling descent. Provided verbal cues for technique and staying back in the chair to reduce risk of tipping forward. Patient required mod A for donning B leg rests, provided cues and demonstration for technique and was independent with doffing leg rests. He continued to require min cues for use of breaks throughout session.  Patient sitting EOB at end of session with breaks locked, bed alarm set, and all needs within reach.    Therapy Documentation Precautions:  Precautions Precautions: Fall, Posterior Hip Precaution Booklet Issued: No Restrictions Weight Bearing Restrictions: Yes LLE Weight Bearing: Non weight bearing   Therapy/Group: Individual Therapy  Mliss Wedin L Catherene Kaleta PT, DPT  06/11/2019, 12:16 PM
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
## 2019-06-12 ENCOUNTER — Inpatient Hospital Stay (HOSPITAL_COMMUNITY): Payer: Medicare Other

## 2019-06-12 ENCOUNTER — Inpatient Hospital Stay (HOSPITAL_COMMUNITY): Payer: Medicare Other | Admitting: Occupational Therapy

## 2019-06-12 LAB — CBC
HCT: 27.7 % — ABNORMAL LOW (ref 39.0–52.0)
Hemoglobin: 8.3 g/dL — ABNORMAL LOW (ref 13.0–17.0)
MCH: 29.6 pg (ref 26.0–34.0)
MCHC: 30 g/dL (ref 30.0–36.0)
MCV: 98.9 fL (ref 80.0–100.0)
Platelets: 254 10*3/uL (ref 150–400)
RBC: 2.8 MIL/uL — ABNORMAL LOW (ref 4.22–5.81)
RDW: 16.2 % — ABNORMAL HIGH (ref 11.5–15.5)
WBC: 5.9 10*3/uL (ref 4.0–10.5)
nRBC: 0 % (ref 0.0–0.2)

## 2019-06-12 LAB — GLUCOSE, CAPILLARY
Glucose-Capillary: 187 mg/dL — ABNORMAL HIGH (ref 70–99)
Glucose-Capillary: 116 mg/dL — ABNORMAL HIGH (ref 70–99)
Glucose-Capillary: 152 mg/dL — ABNORMAL HIGH (ref 70–99)
Glucose-Capillary: 112 mg/dL — ABNORMAL HIGH (ref 70–99)

## 2019-06-12 LAB — PROTIME-INR
INR: 2.8 — ABNORMAL HIGH (ref 0.8–1.2)
Prothrombin Time: 28.7 seconds — ABNORMAL HIGH (ref 11.4–15.2)

## 2019-06-12 MED ORDER — HEPARIN 1000 UNIT/ML FOR PERITONEAL DIALYSIS
INTRAPERITONEAL | Status: DC | PRN
  Filled 2019-06-12: qty 5000

## 2019-06-12 MED ORDER — DELFLEX-LC/1.5% DEXTROSE 344 MOSM/L IP SOLN: INTRAPERITONEAL | Status: DC

## 2019-06-12 MED ORDER — WARFARIN SODIUM 1 MG PO TABS
1.0000 mg | ORAL_TABLET | Freq: Once | ORAL | Status: AC
  Administered 2019-06-12: 1 mg via ORAL
  Filled 2019-06-12: qty 1

## 2019-06-12 MED ORDER — HEPARIN 1000 UNIT/ML FOR PERITONEAL DIALYSIS: 500.0000 [IU] | INTRAMUSCULAR | Status: DC | PRN

## 2019-06-12 MED ORDER — GENTAMICIN SULFATE 0.1 % EX CREA
1.0000 "application " | TOPICAL_CREAM | Freq: Every day | CUTANEOUS | Status: DC
  Administered 2019-06-12 – 2019-06-14 (×3): 1 via TOPICAL
  Filled 2019-06-12: qty 15

## 2019-06-12 MED ORDER — DELFLEX-LC/2.5% DEXTROSE 394 MOSM/L IP SOLN
INTRAPERITONEAL | Status: DC
  Administered 2019-06-12: 5000 mL via INTRAPERITONEAL
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
## 2019-06-12 NOTE — Plan of Care (Signed)
  Problem: RH SAFETY Goal: RH STG ADHERE TO SAFETY PRECAUTIONS W/ASSISTANCE/DEVICE Description: STG Adhere to Safety Precautions With Moderate Assistance/Device. 06/12/2019 1020 by Debroah Loop D, RN Outcome: Progressing 06/12/2019 1019 by Debroah Loop D, RN Outcome: Progressing   Problem: RH PAIN MANAGEMENT Goal: RH STG PAIN MANAGED AT OR BELOW PT'S PAIN GOAL 06/12/2019 1020 by Tennis Ship, RN Outcome: Progressing 06/12/2019 1019 by Tennis Ship, RN Outcome: Progressing

## 2019-06-12 NOTE — Plan of Care (Signed)
  Problem: RH SKIN INTEGRITY Goal: RH STG SKIN FREE OF INFECTION/BREAKDOWN Description: Patient will verbalize ways to prevent skin breakdown by discharge. Outcome: Progressing Goal: RH STG MAINTAIN SKIN INTEGRITY WITH ASSISTANCE Description: STG Maintain Skin Integrity With Moderate Assistance. Outcome: Progressing Goal: RH STG ABLE TO PERFORM INCISION/WOUND CARE W/ASSISTANCE Description: STG Able To Perform Incision/Wound Care With Moderate Assistance. Outcome: Progressing   Problem: RH PAIN MANAGEMENT Goal: RH STG PAIN MANAGED AT OR BELOW PT'S PAIN GOAL Outcome: Progressing

## 2019-06-12 NOTE — Progress Notes (Signed)
Physical Therapy Session Note  Patient Details  Name: Max John Bibb Jr. MRN: [id number] Date of Birth: 02/10/1975  Today's Date: 06/12/2019 PT Individual Time: 1130-1159 PT Individual Time Calculation (min): 29 min   Short Term Goals: Week 1:  PT Short Term Goal 1 (Week 1): Pt will perform supine<>sit with limited use of bed features and mod assist PT Short Term Goal 1 - Progress (Week 1): Met PT Short Term Goal 2 (Week 1): Pt will perform sit<>stand with CGA PT Short Term Goal 2 - Progress (Week 1): Met PT Short Term Goal 3 (Week 1): Pt will perform bed<>chair transfers with CGA PT Short Term Goal 3 - Progress (Week 1): Met PT Short Term Goal 4 (Week 1): Pt will ambulate at least 25ft with min assist PT Short Term Goal 4 - Progress (Week 1): Progressing toward goal PT Short Term Goal 5 (Week 1): Pt will initiate stair navigation PT Short Term Goal 5 - Progress (Week 1): Not progressing(Patient unable to initiate stair training due to pain, plan for ramp placement at home prior to d/c.) Week 2:  PT Short Term Goal 1 (Week 2): STG=LTG due to ELOS.  Skilled Therapeutic Interventions/Progress Updates:   Received pt supine in bed, pt agreeable to therapy, and did not report pain during session but reported nausea. RN notified and administered nausea medication through injection. PT assisted with clean up of blood after injection. Session focused on functional mobility/transfers, LE strength, dynamic standing balance/coordination, ambulation, and improved activity tolerance. Pt transferred supine<>sitting EOB with supervision and use of leg lifter. Pt transferred stand<>pivot with RW CGA x 2 trials throughout session while maintaining L LE TDWB precautions. Pt washed hands at sink with supervision to clean blood off of him after nausea medication injection. Pt ambulated 18ft x 1 and 12ft x 1 trial with RW and CGA while maintaining L LE TDWB precautions. Pt required verbal cues for increased step  length and RW safety with ambulation. Pt transferred sit<>supine with supervision and use of leg lifter. Concluded session with pt supine in bed, needs within reach, and bed alarm on.   Therapy Documentation Precautions:  Precautions Precautions: Fall, Posterior Hip Precaution Booklet Issued: No Restrictions Weight Bearing Restrictions: Yes LLE Weight Bearing: Non weight bearing  Therapy/Group: Individual Therapy  M     PT, DPT   06/12/2019, 7:30 AM  
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
## 2019-06-12 NOTE — Progress Notes (Signed)
Occupational Therapy Note  Patient Details  Name: Brick Ketcher. MRN: [id number] Date of Birth: [birth year]  Today's Date: 06/12/2019 OT Missed Time: 75 Minutes Missed Time Reason: Other (comment)(Pt still hooked up to PD machine)  Pt greeted asleep in bed, and still on PD machine. Pt more lethargic and had difficulty achieving alertness as well. Pt unable to participate in OT until pt removed from PD. OT returned at 8:00 and pt was still on PD. Will ask scheduling to push back therapy start time to allow time for nephrology to remove PD. Pt missed 75 minutes of skilled OT treatment session. Will follow up per plan of care.   Daneen Schick Taura Lamarre 06/12/2019, 2:57 PM

## 2019-06-12 NOTE — Progress Notes (Signed)
Physical Therapy Session Note  Patient Details  Name: Max Pittman. MRN: [id number] Date of Birth: 26-Jun-1975  Today's Date: 06/12/2019 PT Individual Time: 5885-0277 PT Individual Time Calculation (min): 73 min   Short Term Goals: Week 2:  PT Short Term Goal 1 (Week 2): STG=LTG due to ELOS.  Skilled Therapeutic Interventions/Progress Updates:     Patient in bed with PD running upon PT arrival. Patient alert and agreeable to PT session. Patient denied pain during session, reported he was pre-medicated prior to session. Per RN, patient is cleared to sit EOB and stand while monitoring PD lines. Patient sat EOB, see details below, and ate breakfast as we discussed scheduling family education on Thursday afternoon via [place] or [place]. Discussed healthy eating choices to and nutrition intake, encourage discussing with his nephrologist as well and discuss possible nutrition consult if patient chooses. PD completed 10 min into session, RN called nephrology to have his PD unhooked for therapy.RN from nephrology arrived to [facility] PD, retrieved HEP and hip precautions handout and provided to patient and reviewed them during. Also, discussed PD schedule with RN, plan to initiate PD earlier in the evening and start therapies at 9am to allow time for PD to be completed and be unhooked before therapies. Will notify scheduling.    Therapeutic Activity: Bed Mobility: Patient performed supine to/from sit x2 with supervision using a leg lifter, min use of bed rail on first trial and no use of bed rail in a flat bed on second trial. Provided verbal cues for bringing LEs off the bed before pushing through UEs to sit up, and avoid L hip adduction and IR with mobility to maintain precautions. Transfers: Patient performed sit to/from stand from the bed and and a toilet transfer with CGA-close supervision using a RW. Provided verbal cues for placing L foot forward to prevent weight bearing and avoiding hip flexion  to stand to maintain hip precautions. Patient was continent of bowl and bladder on the toilet during session, see flowsheet for details. Patient performed peri-care independently and dressed following toileting. Doffed/donned t-shirt independently and doffed/donned pants with supervision with increased time and cues for maintaining hip precautions. Threaded LE's in sitting and pulled them up in standing with close supervision for safety/balance.  Provided education on performing diaphragmatic breathing and relaxation techniques versus Val salva maneuver during BM to reduce intraabdominal pressure and risk of elevated BP, or a syncopal episode.  Gait Training:  Patient ambulated 12 feet to/from the bathroom using RW with CGA. Ambulated with hop-to gait pattern on R with intermittent L toe placement. Provided verbal cues for maintaining NWB/TDWB only on L LE and use of UEs to lift R LE off the floor with control.  Patient in bed at end of session with breaks locked, bed alarm set, and all needs within reach. Patient continues to require increased time and cues to maintain all precautions to complete all mobility. Focused session on patient education due to limited mobility secondary to PD management and need for breakfast and BM during session.    Therapy Documentation Precautions:  Precautions Precautions: Fall, Posterior Hip Precaution Booklet Issued: No Restrictions Weight Bearing Restrictions: Yes LLE Weight Bearing: Touchdown weight bearing    Therapy/Group: Individual Therapy  Max Pittman L Max Pittman PT, DPT  06/12/2019, 4:59 PM

## 2019-06-13 ENCOUNTER — Inpatient Hospital Stay (HOSPITAL_COMMUNITY): Payer: Medicare Other | Admitting: Occupational Therapy

## 2019-06-13 ENCOUNTER — Inpatient Hospital Stay (HOSPITAL_COMMUNITY): Payer: Medicare Other

## 2019-06-13 LAB — GLUCOSE, CAPILLARY
Glucose-Capillary: 115 mg/dL — ABNORMAL HIGH (ref 70–99)
Glucose-Capillary: 90 mg/dL (ref 70–99)
Glucose-Capillary: 130 mg/dL — ABNORMAL HIGH (ref 70–99)
Glucose-Capillary: 126 mg/dL — ABNORMAL HIGH (ref 70–99)

## 2019-06-13 LAB — CBC
HCT: 27.5 % — ABNORMAL LOW (ref 39.0–52.0)
Hemoglobin: 8.2 g/dL — ABNORMAL LOW (ref 13.0–17.0)
MCH: 29.6 pg (ref 26.0–34.0)
MCHC: 29.8 g/dL — ABNORMAL LOW (ref 30.0–36.0)
MCV: 99.3 fL (ref 80.0–100.0)
Platelets: 248 10*3/uL (ref 150–400)
RBC: 2.77 MIL/uL — ABNORMAL LOW (ref 4.22–5.81)
RDW: 16 % — ABNORMAL HIGH (ref 11.5–15.5)
WBC: 6 10*3/uL (ref 4.0–10.5)
nRBC: 0 % (ref 0.0–0.2)

## 2019-06-13 LAB — PROTIME-INR
INR: 3.3 — ABNORMAL HIGH (ref 0.8–1.2)
Prothrombin Time: 32.6 seconds — ABNORMAL HIGH (ref 11.4–15.2)

## 2019-06-13 MED ORDER — DELFLEX-LC/1.5% DEXTROSE 344 MOSM/L IP SOLN: INTRAPERITONEAL | Status: DC

## 2019-06-13 MED ORDER — OXYCODONE HCL ER 10 MG PO T12A
10.0000 mg | EXTENDED_RELEASE_TABLET | Freq: Every day | ORAL | Status: DC
  Administered 2019-06-14 – 2019-06-17 (×4): 10 mg via ORAL
  Filled 2019-06-13 (×4): qty 1

## 2019-06-13 NOTE — Progress Notes (Signed)
Physical Therapy Session Note  Patient Details  Name: Max Pittman. MRN: [id number] Date of Birth: November 08, 1975  Today's Date: 06/13/2019 PT Individual Time: 1000-1030 and 1455-1530 PT Individual Time Calculation (min): 30 min and 35 min  Short Term Goals: Week 2:  PT Short Term Goal 1 (Week 2): STG=LTG due to ELOS.  Skilled Therapeutic Interventions/Progress Updates:     Session 1: Patient in w/c in room upon PT arrival. Patient alert and agreeable to PT session. Patient reported 9.5/10 L hip pain and R hip flexor soreness during session, RN made aware. PT provided repositioning, rest breaks, and distraction as pain interventions throughout session. Patient TTP over R hip flexor with muscle spasm with deep pressure and pain with active hip flexion. Educated on overuse of R LE with L LE TDWB. Recommended self massage and ice as needed.   Therapeutic Activity: Bed Mobility: Patient performed sit to supine with supervision in a flat bed without use of bed rails using a leg lifter for L LE managment. Provided verbal cues for technique to bring B LEs onto the bed with increased R hip flexor soreness.. Transfers: Patient performed stand pivot w/c>bed with CGA-close supervision. Provided verbal cues for reaching back to sit for controlled descent and safety.  Therapeutic Exercise: Patient performed the following exercises with verbal and tactile cues for proper technique. -L ankle pumps x10 -L heel slides AAROM with leg lifter 2x5 -L quad sets x10 with 5 sec hold\ -L SAQ x10 -seated L LAQ 2x5  Patient in bed at end of session with breaks locked, bed alarm set, and all needs within reach.   Session 2: Patient in w/c in room upon PT arrival. Patient reported 10/10 L hip and knee pain and requested to get back to bed at beginning of session, RN made aware and provided pain medicine and a muscle relaxer during session. PT provided repositioning, rest breaks, and distraction as pain  interventions throughout session.   Therapeutic Activity: Transfers: Patient attempted sit to/from stand x2, but was unable to come to standing due to L LE pain. He then performed a lateral scoot transfer w/c>bed with min A and multiple scoots due to L LE pain and fear of movement. Provided cues for hand placement and head-hips relationship for proper technique and decreased assist with transfers.  Bed Mobility: Patient performed sit to supine with max A for LE management with HOB elevated due to increased pain. Patient sat EOB sliding closer to the edge for an increased amount of time due to fear of movement/pain. PT provided relaxation techniques and educated on increased risk of falling due to positioning on EOB, patient then agreeable to allow PT to assist LEs into the bed.   Patient with full body spasms during and following mobility. Provided cues for diaphragmatic breathing and guided imagery with pain neuroscience re-education with patient in supine. Spasms resolved and patient reported that he was comfortable lying in the bed and wanted to rest and continue these techniques to relax.   Patient in bed at end of session with breaks locked, bed alarm set, and all needs within reach. Patient missed 10 min of skilled PT due to pain, RN made aware. Will attempt to make-up missed time as able.     Therapy Documentation Precautions:  Precautions Precautions: Fall, Posterior Hip Precaution Booklet Issued: No Restrictions Weight Bearing Restrictions: Yes LLE Weight Bearing: Touchdown weight bearing    Therapy/Group: Individual Therapy  Rheda Kassab L Oliwia Berzins PT, DPT  06/13/2019, 12:11 PM
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
## 2019-06-13 NOTE — Progress Notes (Signed)
Occupational Therapy Session Note  Patient Details  Name: Max Pittman. MRN: [id number] Date of Birth: 08-19-1975  Today's Date: 06/13/2019  Session 1 OT Individual Time: 9937-1696 OT Individual Time Calculation (min): 57 min   Session 2 OT Individual Time: 7893-8101 OT Individual Time Calculation (min): 57 min   Short Term Goals: Week 2:  OT Short Term Goal 1 (Week 2): LTG=STG 2/2 ELOS  Skilled Therapeutic Interventions/Progress Updates:  Session 1   Pt greeted up in wc at the sink. OT asked pt who got him up to the wc and he stated, "noone, my walker was close enough for me to use it." OT educated extensively on high fall risk and safety awareness. OT reviewed importance of maintaining TDWB for proper healing and discussed the use of telemonitor if patient does not follow safety rules as pt turns off his own bed alarm. Pt propelled wc to therapy gym and completed 3 sets of 5 mins on [employer] arm bike on level 4, 5, then 6. OT discussed DME needs and home bathroom set-up. OT showed pt tub bench to order on [facility] and wrote out description for patient. Pt reported he also purchased a hip kit for long handled AE for BADL tasks. Worked on standing balance within simulated toileting task by having pt reach behind to grasp clothes pins placed on string placed around hips. Pt returned to room at end of session and left seated in wc with alarm belt on, call bell in reach, and needs met.   Session 2 Pt greeted seated EOB after recently finishing lunch, and agreeable to OT treatment session. Pt reported pain in L knee, but is not due for pain medicine. Pt needed increased time to initiate sit<>stand from EOB w/ RW and 2 trials 2/2 high pain and anxiety. Pt eventually able to come to standing with min A, then CGA to pivot over to wc. Worked on [employer] and community reintegration from [employer] level. OT educated on safety awareness, elevator access in wc, and maneuvering wc across different terrain.  Attempted L knee extension and hip extension exercises once outside, but pt unable to tolerate any movement of LLE, reporting 10/10 pain. UB strength/endurance with wc propulsion down long [place] hallway. OT asked nursing for lidocaine patch, which she was able to get for pain management. Sit<>stand from wc with min A, then pt able to maintain standing balance with min A to remove pants so OT could place lidocaine patch and ace wrap. Min A to then to pull pants back up. OT also placed ice pack for pain and pt left seated in wc with needs met.    Therapy Documentation Precautions:  Precautions Precautions: Fall, Posterior Hip Precaution Booklet Issued: No Restrictions Weight Bearing Restrictions: Yes LLE Weight Bearing: Touchdown weight bearing Pain: Pain Assessment Pain Scale: 0-10 Pain Score: 9  Pain Intervention(s): Rest, repositioned   Therapy/Group: Individual Therapy  Valma Cava 06/13/2019, 3:38 PM
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
## 2019-06-13 NOTE — Progress Notes (Signed)
Patient noted to be sweating and in increased pain. Pain medication last given at 1500, pain still 10/10. Algis Liming, PA notified and this RN was instructed to give patients PRN pain medication early

## 2019-06-13 NOTE — Progress Notes (Addendum)
Patient ID: Max Chute., male   DOB: 1975-04-07, 44 y.o.   MRN: [id number]    SW called pt s/o Max Pittman [phone]) to provide updates from team conference with pt remaining on target for d/c 5/22. SW also informed on DME: w/c, RW, and 3in1 BSC. Reports that a ramp is being built as of today. SW to follow-up with pt about HHA preference.   *pt trialed PD last night. SW waiting on follow-up if pt will transition completely from HD to PD by time of d/c.   Loralee Pacas, MSW, [place] Office: [phone] Cell: [phone] Fax: [phone]

## 2019-06-13 NOTE — Plan of Care (Signed)
  Problem: RH SAFETY Goal: RH STG ADHERE TO SAFETY PRECAUTIONS W/ASSISTANCE/DEVICE Description: STG Adhere to Safety Precautions With Moderate Assistance/Device. Outcome: Progressing   Problem: RH PAIN MANAGEMENT Goal: RH STG PAIN MANAGED AT OR BELOW PT'S PAIN GOAL Outcome: Progressing
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
## 2019-06-13 NOTE — Progress Notes (Signed)
Renal Navigator has provided update to patient's dialysis clinic/[name] that we are trialing him back on PD in hopes that he can return to PD prior to discharge and will keep them up to date if he does not require his seat in the HD clinic next week.  Alphonzo Cruise, [place] Renal Navigator [phone]

## 2019-06-14 ENCOUNTER — Inpatient Hospital Stay (HOSPITAL_COMMUNITY): Payer: Medicare Other

## 2019-06-14 ENCOUNTER — Inpatient Hospital Stay (HOSPITAL_COMMUNITY): Payer: Medicare Other | Admitting: Occupational Therapy

## 2019-06-14 ENCOUNTER — Inpatient Hospital Stay (HOSPITAL_COMMUNITY): Payer: Medicare Other | Admitting: Physical Therapy

## 2019-06-14 LAB — CBC
HCT: 27.7 % — ABNORMAL LOW (ref 39.0–52.0)
Hemoglobin: 8.3 g/dL — ABNORMAL LOW (ref 13.0–17.0)
MCH: 29.2 pg (ref 26.0–34.0)
MCHC: 30 g/dL (ref 30.0–36.0)
MCV: 97.5 fL (ref 80.0–100.0)
Platelets: 269 10*3/uL (ref 150–400)
RBC: 2.84 MIL/uL — ABNORMAL LOW (ref 4.22–5.81)
RDW: 15.9 % — ABNORMAL HIGH (ref 11.5–15.5)
WBC: 5.9 10*3/uL (ref 4.0–10.5)
nRBC: 0 % (ref 0.0–0.2)

## 2019-06-14 LAB — GLUCOSE, CAPILLARY
Glucose-Capillary: 117 mg/dL — ABNORMAL HIGH (ref 70–99)
Glucose-Capillary: 139 mg/dL — ABNORMAL HIGH (ref 70–99)
Glucose-Capillary: 96 mg/dL (ref 70–99)
Glucose-Capillary: 121 mg/dL — ABNORMAL HIGH (ref 70–99)

## 2019-06-14 LAB — PROTIME-INR
INR: 2.6 — ABNORMAL HIGH (ref 0.8–1.2)
Prothrombin Time: 27 seconds — ABNORMAL HIGH (ref 11.4–15.2)

## 2019-06-14 IMAGING — CT CT EXTREM LOW W/O CM*L*
3 of 4 series · 9 of 33 positions shown, 10 images · non-contrast
Comparison: Left knee x-ray [DATE]

CLINICAL DATA: Left lower extremity soft tissue infection

EXAM:
CT OF THE LOWER LEFT EXTREMITY WITHOUT CONTRAST
TECHNIQUE: Multidetector CT imaging of the lower left extremity was performed
according to the standard protocol. Field of view is from the distal
femoral diaphysis through the level of the tibiotalar joint.

[Series 4: lt tib/fib 3.0 b40s · axial · 0.49mm/px · z∈[+307,+556]mm · 2 of 181 slices shown, 3 images]
[im 56/181  soft-tissue]
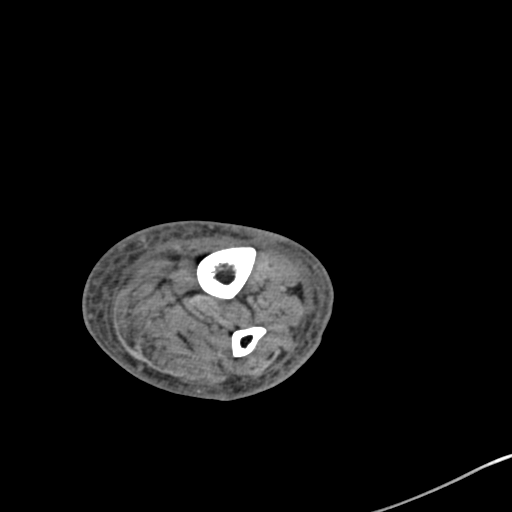
[im 56/181  bone]
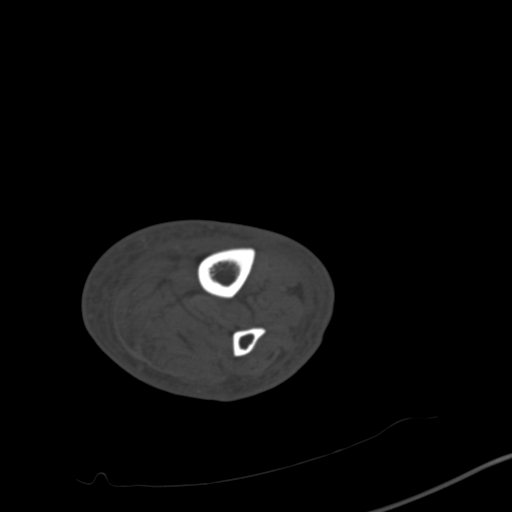
[im 139/181  bone]
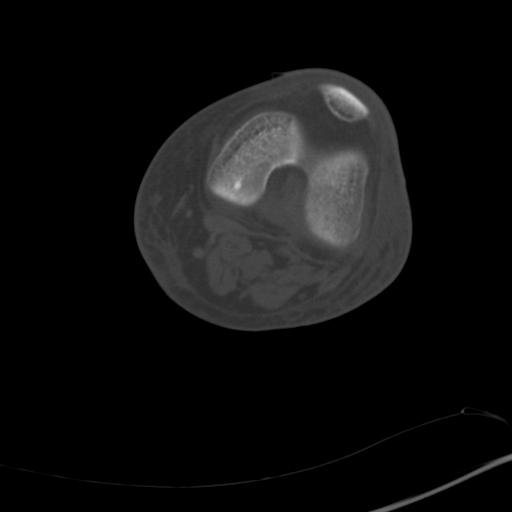

[Series 7: coronal bone · coronal · 0.43mm/px · 2 of 126 slices shown]
[im 11/126  bone]
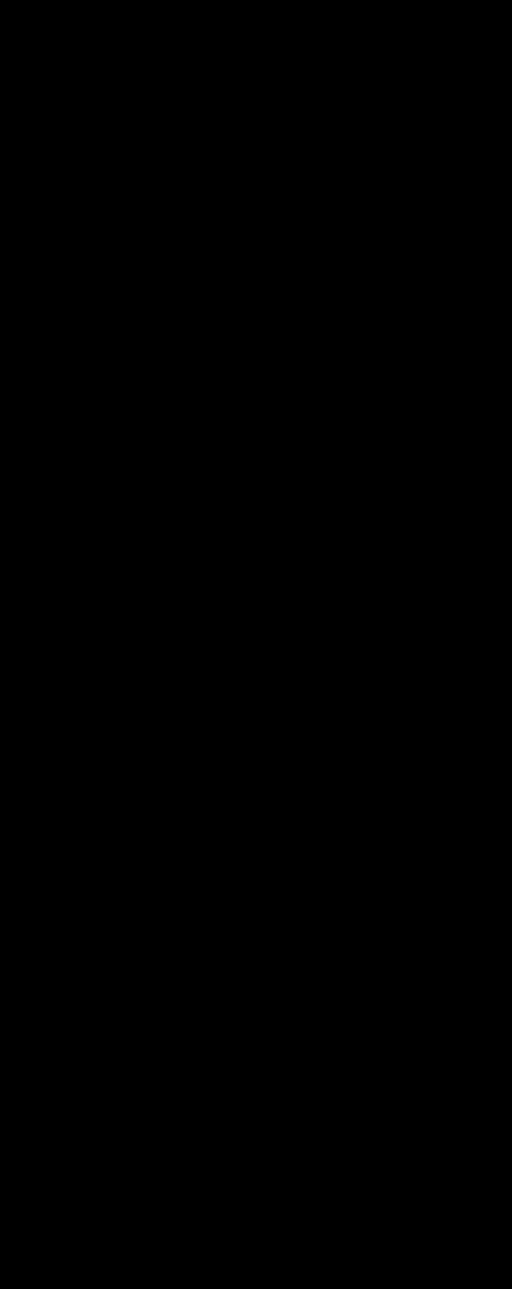
[im 68/126  bone]
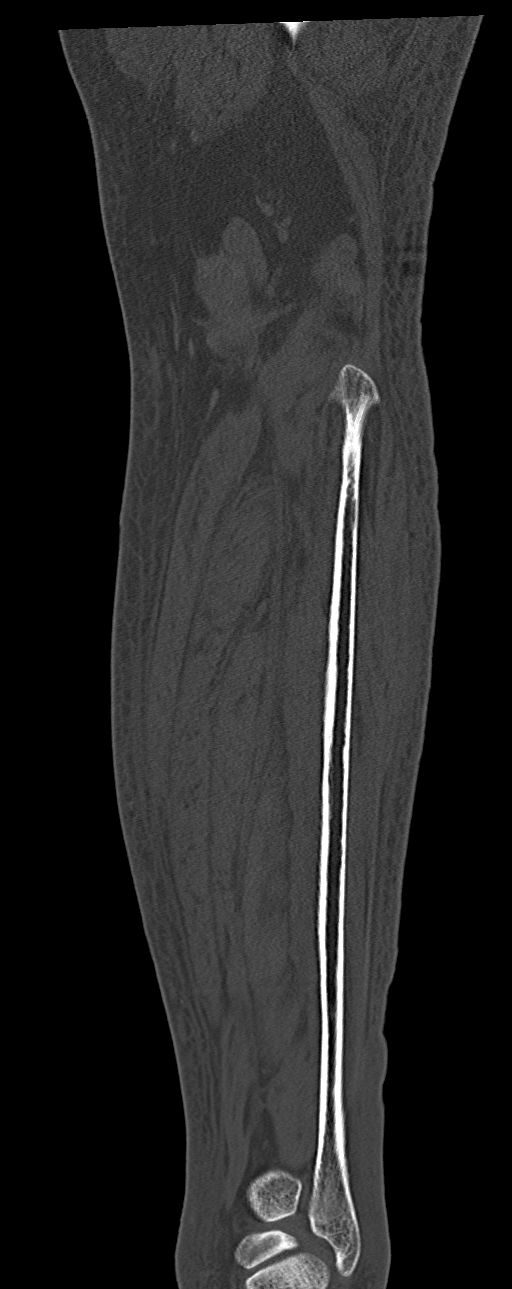

[Series 10: sagittalsoft tissue · sagittal · 0.49mm/px · 5 of 107 slices shown]
[im 36/107  bone]
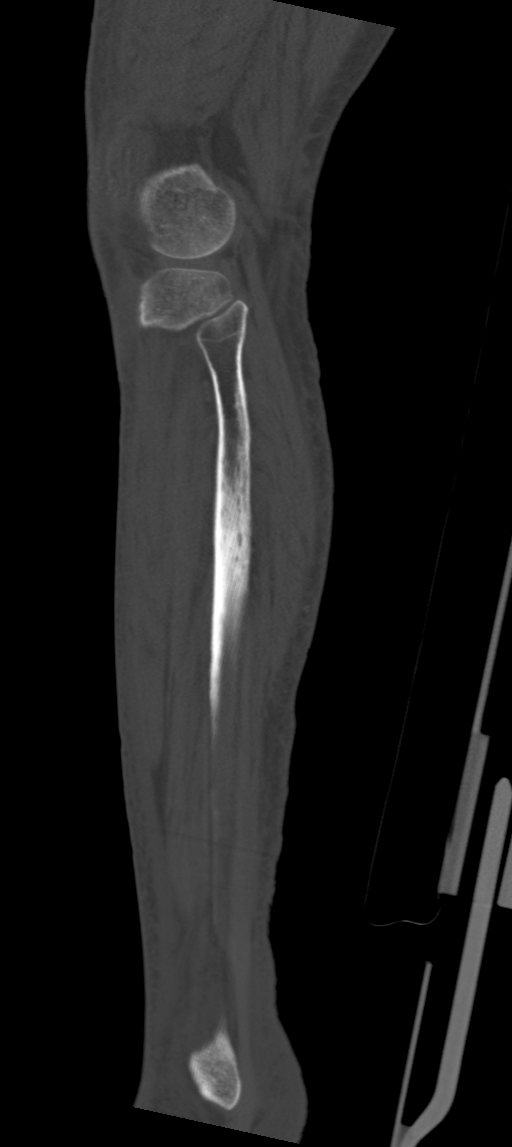
[im 45/107  bone]
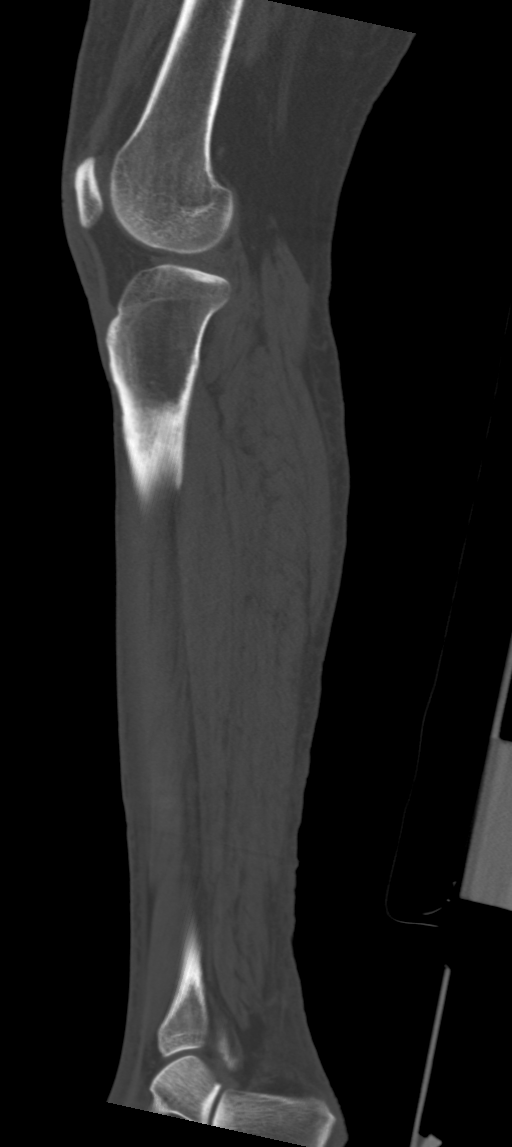
[im 54/107  bone]
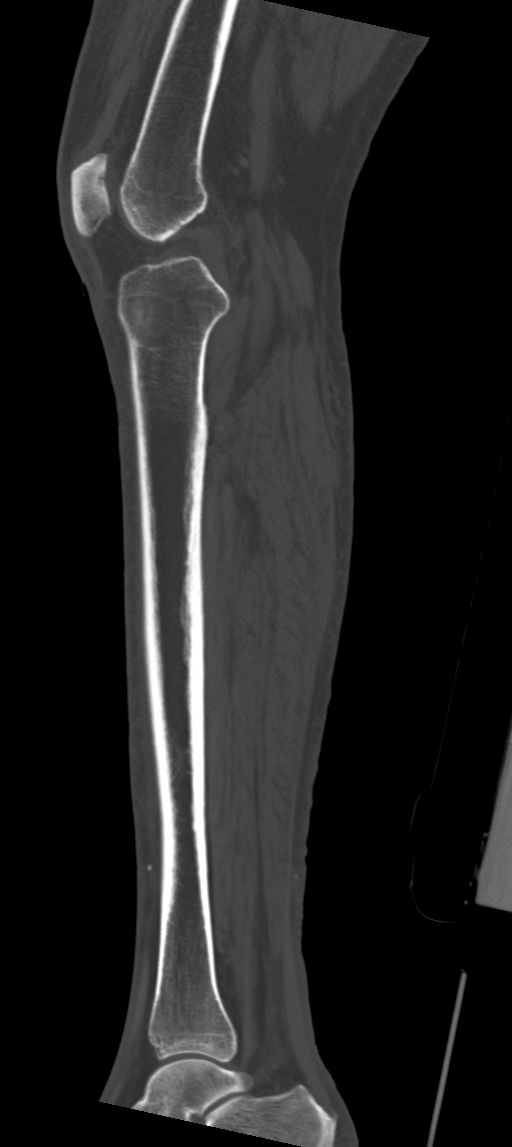
[im 62/107  bone]
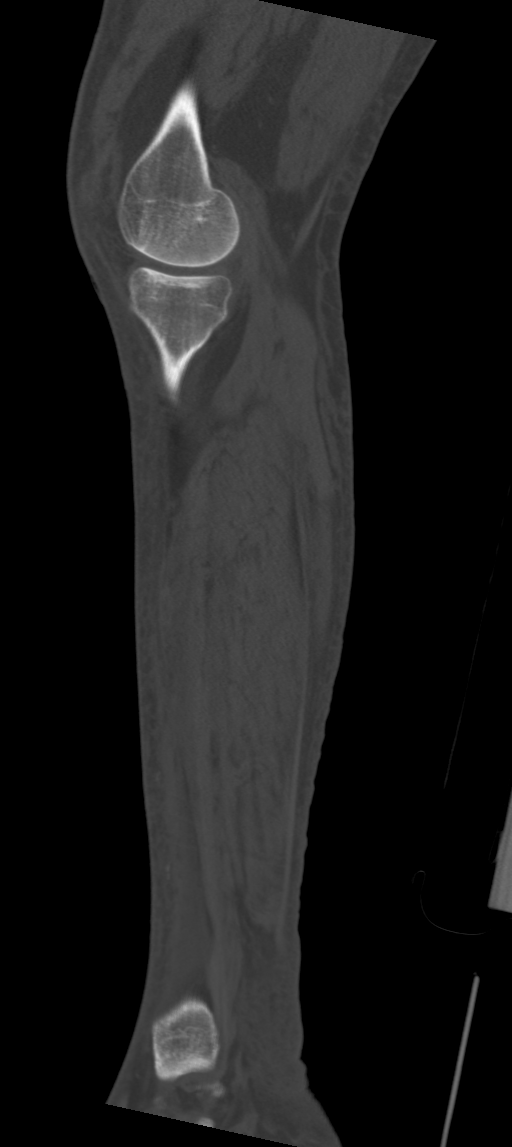
[im 71/107  bone]
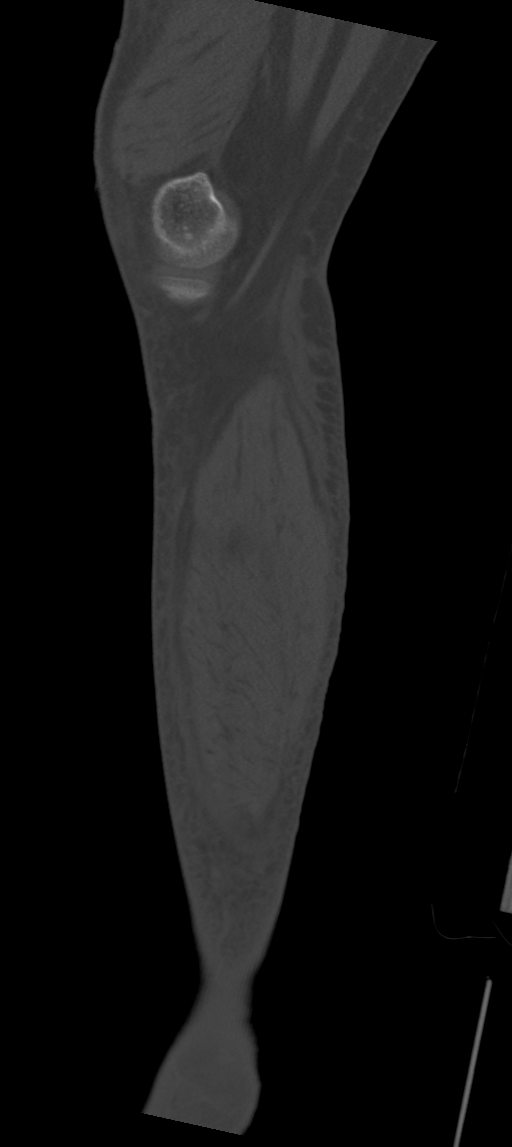

[9 of 33 positions shown; findings below may reference images not displayed]

FINDINGS: Bones/Joint/Cartilage

No acute fracture. No dislocation. No bony erosion or periostitis.
Mild subchondral cystic changes at the medial patellar facet and
patellar apex. Knee joint spaces appear preserved. No knee joint
effusion. Ankle mortise is congruent without significant
arthropathy. No tibiotalar joint effusion.

Ligaments

Suboptimally assessed by CT.

Muscles and Tendons

Unremarkable appearance of the myotendinous structures. No
intramuscular fluid collection. No tenosynovial fluid collection.

Soft tissues

Circumferential subcutaneous edema throughout the visualized left
lower extremity. Layering fluid overlies the superficial investing
fascia of the lower extremity musculature. No focal soft tissue
ulceration is seen. No soft tissue gas. No deep fascial fluid
collections.
IMPRESSION: 1. Diffuse left lower extremity subcutaneous fluid and edema.
Findings are nonspecific but can be seen in the setting of
cellulitis, DVT, venous insufficiency, as well as systemic
processes. There is no soft tissue gas or deep fascial fluid.
2. No acute osseous abnormality.

## 2019-06-14 MED ORDER — ALPRAZOLAM 0.25 MG PO TABS
0.2500 mg | ORAL_TABLET | Freq: Once | ORAL | Status: AC
  Administered 2019-06-14: 0.25 mg via ORAL
  Filled 2019-06-14: qty 1

## 2019-06-14 MED ORDER — GENTAMICIN SULFATE 0.1 % EX CREA
1.0000 "application " | TOPICAL_CREAM | Freq: Every day | CUTANEOUS | Status: DC
  Administered 2019-06-14 – 2019-06-17 (×4): 1 via TOPICAL
  Filled 2019-06-14: qty 15

## 2019-06-14 MED ORDER — HEPARIN 1000 UNIT/ML FOR PERITONEAL DIALYSIS
INTRAPERITONEAL | Status: DC | PRN
  Filled 2019-06-14: qty 5000

## 2019-06-14 MED ORDER — DELFLEX-LC/1.5% DEXTROSE 344 MOSM/L IP SOLN: INTRAPERITONEAL | Status: DC

## 2019-06-14 MED ORDER — ACETAMINOPHEN 325 MG PO TABS
650.0000 mg | ORAL_TABLET | Freq: Three times a day (TID) | ORAL | Status: DC
  Administered 2019-06-14 – 2019-06-17 (×9): 650 mg via ORAL
  Filled 2019-06-14 (×9): qty 2

## 2019-06-14 MED ORDER — WARFARIN SODIUM 3 MG PO TABS
3.0000 mg | ORAL_TABLET | Freq: Once | ORAL | Status: AC
  Administered 2019-06-14: 3 mg via ORAL
  Filled 2019-06-14: qty 1

## 2019-06-14 MED ORDER — DELFLEX-LC/2.5% DEXTROSE 394 MOSM/L IP SOLN: INTRAPERITONEAL | Status: DC

## 2019-06-14 MED ORDER — HEPARIN 1000 UNIT/ML FOR PERITONEAL DIALYSIS: 500.0000 [IU] | INTRAMUSCULAR | Status: DC | PRN

## 2019-06-14 NOTE — Progress Notes (Signed)
Patient ID: Max Pittman., male   DOB: 1975/05/18, 44 y.o.   MRN: [id number]    SW met with pt in room to provide [facility] list. Pt has no preferred HHA.   SW waiting on follow-up from [facility] about referral.   Loralee Pacas, MSW, [place] Office: [phone] Cell: [phone] Fax: [phone]

## 2019-06-14 NOTE — Progress Notes (Signed)
Physical Therapy Session Note  Patient Details  Name: Max Pittman. MRN: [id number] Date of Birth: July 05, 1975  Today's Date: 06/14/2019 PT Individual Time: 3888-2800 PT Individual Time Calculation (min): 32 min  and Today's Date: 06/14/2019 PT Missed Time: 13 Minutes Missed Time Reason: Other (Comment)(Eating)  Short Term Goals: Week 2:  PT Short Term Goal 1 (Week 2): STG=LTG due to ELOS.  Skilled Therapeutic Interventions/Progress Updates:     Pt received supine in bed and agreeable to therapy, but requests to remain supine in bed due to pain in L knee. Rates pain at 9.5/10. Lidocaine patch over L knee and PT limits activity to LLE during session to address pain.   Pt performs supine therex. 1x10 quad sets. 1x10 glute sets, 1x10 BLE Hip Abduction. 1x10 SAQs. PT provides verbal and tactile cues for correct performance and modifies therex to limit pain provocation on L knee. Pt requires extended time for each exercise secondary to pain.  Pt handoff to OT. Supine in bed with all needs within reach.  Therapy Documentation Precautions:  Precautions Precautions: Fall, Posterior Hip Precaution Booklet Issued: No Restrictions Weight Bearing Restrictions: Yes LLE Weight Bearing: Touchdown weight bearing   Therapy/Group: Individual Therapy  Breck Coons, PT, DPT 06/14/2019, 4:26 PM
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
## 2019-06-14 NOTE — Plan of Care (Signed)
  Problem: RH SAFETY Goal: RH STG ADHERE TO SAFETY PRECAUTIONS W/ASSISTANCE/DEVICE Description: STG Adhere to Safety Precautions With Moderate Assistance/Device. Outcome: Progressing   Problem: RH PAIN MANAGEMENT Goal: RH STG PAIN MANAGED AT OR BELOW PT'S PAIN GOAL Outcome: Progressing
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
## 2019-06-14 NOTE — Progress Notes (Signed)
Occupational Therapy Session Note  Patient Details  Name: Max Pittman. MRN: [id number] Date of Birth: 07/13/1975  Today's Date: 06/14/2019 OT Individual Time: 8416-6063 OT Individual Time Calculation (min): 26 min (missed 19 min)   Short Term Goals: Week 2:  OT Short Term Goal 1 (Week 2): LTG=STG 2/2 ELOS  Skilled Therapeutic Interventions/Progress Updates:    Treatment session with focus on activity within pain tolerance.  Pt reports excruciating pain in Lt knee and LLE.  Engaged in knee flexion/heel slides in bed with max assist to bend even slightly at knee.  Pt reports increased pain.  Encouraged ankle pumps with pt able to tolerate 5 before requesting to terminate task.  Engaged in lengthy discussion about current status and pt goals.  Pt concerned about increased pain and anxious to hear results of CT scan.  Pt refused additional therapy intervention and refused reposition in bed.  Pt missed remaining 19 mins due to pain.  Therapy Documentation Precautions:  Precautions Precautions: Fall, Posterior Hip Precaution Booklet Issued: No Restrictions Weight Bearing Restrictions: Yes LLE Weight Bearing: Touchdown weight bearing General: General OT Amount of Missed Time: 19 Minutes Vital Signs: Therapy Vitals Temp: 98.2 F (36.8 C) Pulse Rate: (!) 103 Resp: 18 BP: 106/73 Patient Position (if appropriate): Lying Oxygen Therapy SpO2: 99 % O2 Device: Room Air Pain:  Pt with no c/o pain   Therapy/Group: Individual Therapy  Simonne Come 06/14/2019, 4:13 PM

## 2019-06-14 NOTE — Progress Notes (Signed)
Physical Therapy Session Note  Patient Details  Name: Max John Brodman Jr. MRN: [id number] Date of Birth: 02/12/1975  Today's Date: 06/14/2019     Short Term Goals: Week 1:  PT Short Term Goal 1 (Week 1): Pt will perform supine<>sit with limited use of bed features and mod assist PT Short Term Goal 1 - Progress (Week 1): Met PT Short Term Goal 2 (Week 1): Pt will perform sit<>stand with CGA PT Short Term Goal 2 - Progress (Week 1): Met PT Short Term Goal 3 (Week 1): Pt will perform bed<>chair transfers with CGA PT Short Term Goal 3 - Progress (Week 1): Met PT Short Term Goal 4 (Week 1): Pt will ambulate at least 25ft with min assist PT Short Term Goal 4 - Progress (Week 1): Progressing toward goal PT Short Term Goal 5 (Week 1): Pt will initiate stair navigation PT Short Term Goal 5 - Progress (Week 1): Not progressing(Patient unable to initiate stair training due to pain, plan for ramp placement at home prior to d/c.)  Skilled Therapeutic Interventions/Progress Updates: Pt presented in bed awaiting disconnect from PD. Nsg aware and dialysis dept notified. Checked 15 min and pt still connected to PD. Will continue efforts.      Therapy Documentation Precautions:  Precautions Precautions: Fall, Posterior Hip Precaution Booklet Issued: No Restrictions Weight Bearing Restrictions: Yes LLE Weight Bearing: Touchdown weight bearing General:   Vital Signs: Therapy Vitals Temp: 97.8 F (36.6 C) Pulse Rate: 99 Resp: 18 BP: 109/65 Patient Position (if appropriate): Lying Oxygen Therapy SpO2: 95 % O2 Device: Room Air Pain: Pain Assessment Pain Scale: 0-10 Pain Score: 9  Pain Intervention(s): Medication (See eMAR)    Therapy/Group: Individual Therapy      , PTA  06/14/2019, 7:51 AM  

## 2019-06-14 NOTE — Progress Notes (Signed)
Physical Therapy Session Note  Patient Details  Name: Max Pittman. MRN: [id number] Date of Birth: May 24, 1975  Today's Date: 06/14/2019 PT Individual Time: 1100-1200 PT Individual Time Calculation (min): 60 min   Short Term Goals: Week 2:  PT Short Term Goal 1 (Week 2): STG=LTG due to ELOS.    Skilled Therapeutic Interventions/Progress Updates:     Patient in bed upon PT arrival. Patient alert and agreeable to PT session. Patient reported 9.5/10 L thigh and knee pain during session, RN made aware. PT provided repositioning, rest breaks, and distraction as pain interventions throughout session. On observation and assessment, patient presents with pain that is TTP over his vastus medialus with increased edema throughout the whole leg, paticularly his medial thigh. He is also unable to lift his leg or tolerate beding his knee in supine. Patient stated that this stared night before last and has increasingly gotten worse. RN, Dr. Naaman Plummer, and Linna Hoff, [place], notified.   Therapeutic Activity: Bed Mobility: Patient donned pants in supine with mod A for threading LEs, then patient was able to pull them up performing partial rolling. Patient performed supine to sit with mod A and sit to supine with mod A +2 for LE management with HOB elevated due to increased L LE pain. Required significantly increased time to complete mobility due to L LE pain and fear of movement. PT provided cues for diaphragmatic breathing and positioning to reduce stress on patient's L thigh and knee.   Therapeutic Exercise: Patient performed the following exercises with verbal and tactile cues for proper technique. -L heel slides x5 with AAROM  -B ankle pumps x10 -B glut sets x10 with 5 sed hold  Educated patient on assessment findings and communication with medical team. Recommended patient not transfer OOB until medical team assesses L LE. Patient declined ice for pain control. Patient with increased L knee pain limiting  mobility today (requiring mod A +1-2 people versus CGA-supervision yesterday morning). Patient missed 15 min of skilled PT due to pain, RN made aware. Will attempt to make-up missed time as able.    Patient in bed with L LE elevated at end of session with breaks locked, bed alarm set, and all needs within reach.    Therapy Documentation Precautions:  Precautions Precautions: Fall, Posterior Hip Precaution Booklet Issued: No Restrictions Weight Bearing Restrictions: Yes LLE Weight Bearing: Touchdown weight bearing General: PT Amount of Missed Time (min): 15 Minutes PT Missed Treatment Reason: Pain    Therapy/Group: Individual Therapy  Mearl Harewood L Artemis Loyal PT, DPT  06/14/2019, 5:03 PM

## 2019-06-15 ENCOUNTER — Encounter (HOSPITAL_COMMUNITY): Payer: Medicare Other | Admitting: Occupational Therapy

## 2019-06-15 ENCOUNTER — Inpatient Hospital Stay (HOSPITAL_COMMUNITY): Payer: Medicare Other

## 2019-06-15 ENCOUNTER — Inpatient Hospital Stay (HOSPITAL_COMMUNITY): Payer: Medicare Other | Admitting: Occupational Therapy

## 2019-06-15 ENCOUNTER — Inpatient Hospital Stay (HOSPITAL_COMMUNITY): Payer: Medicare Other | Admitting: Physical Therapy

## 2019-06-15 LAB — CBC
HCT: 27.6 % — ABNORMAL LOW (ref 39.0–52.0)
Hemoglobin: 8.2 g/dL — ABNORMAL LOW (ref 13.0–17.0)
MCH: 29.1 pg (ref 26.0–34.0)
MCHC: 29.7 g/dL — ABNORMAL LOW (ref 30.0–36.0)
MCV: 97.9 fL (ref 80.0–100.0)
Platelets: 296 10*3/uL (ref 150–400)
RBC: 2.82 MIL/uL — ABNORMAL LOW (ref 4.22–5.81)
RDW: 15.7 % — ABNORMAL HIGH (ref 11.5–15.5)
WBC: 6.5 10*3/uL (ref 4.0–10.5)
nRBC: 0 % (ref 0.0–0.2)

## 2019-06-15 LAB — GLUCOSE, CAPILLARY
Glucose-Capillary: 107 mg/dL — ABNORMAL HIGH (ref 70–99)
Glucose-Capillary: 149 mg/dL — ABNORMAL HIGH (ref 70–99)
Glucose-Capillary: 107 mg/dL — ABNORMAL HIGH (ref 70–99)
Glucose-Capillary: 153 mg/dL — ABNORMAL HIGH (ref 70–99)

## 2019-06-15 LAB — PROTIME-INR
INR: 2.8 — ABNORMAL HIGH (ref 0.8–1.2)
Prothrombin Time: 28.2 seconds — ABNORMAL HIGH (ref 11.4–15.2)

## 2019-06-15 LAB — BASIC METABOLIC PANEL
Anion gap: 11 (ref 5–15)
BUN: 53 mg/dL — ABNORMAL HIGH (ref 6–20)
CO2: 27 mmol/L (ref 22–32)
Calcium: 8.9 mg/dL (ref 8.9–10.3)
Chloride: 97 mmol/L — ABNORMAL LOW (ref 98–111)
Creatinine, Ser: 9.08 mg/dL — ABNORMAL HIGH (ref 0.61–1.24)
GFR calc Af Amer: 7 mL/min — ABNORMAL LOW (ref >60)
GFR calc non Af Amer: 6 mL/min — ABNORMAL LOW (ref >60)
Glucose, Bld: 119 mg/dL — ABNORMAL HIGH (ref 70–99)
Potassium: 4.5 mmol/L (ref 3.5–5.1)
Sodium: 135 mmol/L (ref 135–145)

## 2019-06-15 IMAGING — CT CT HIP*L* W/O CM
2 of 4 series · 14 of 46 positions shown, 16 images · non-contrast
Comparison: X-ray [DATE], CT [DATE]

CLINICAL DATA: Left thigh swelling

EXAM:
CT OF THE LEFT FEMUR WITHOUT CONTRAST
TECHNIQUE: Multidetector CT imaging of the left femur was performed according
to the standard protocol. Multiplanar CT image reconstructions were
also generated.

[Series 4: hip 2.0 st · axial · 0.61mm/px · z∈[-91,+405]mm · 11 of 288 slices shown, 13 images]
[im 20/288  soft-tissue]
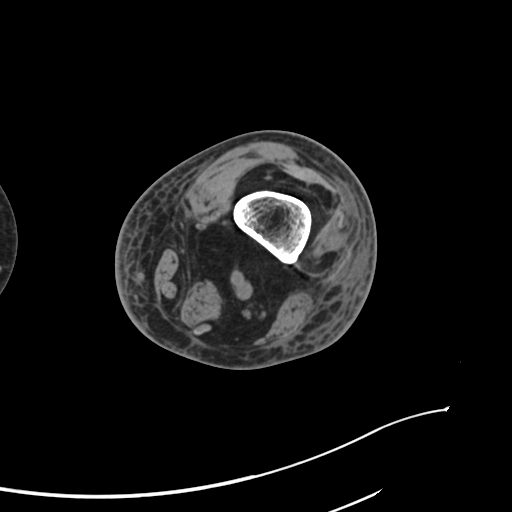
[im 20/288  bone]
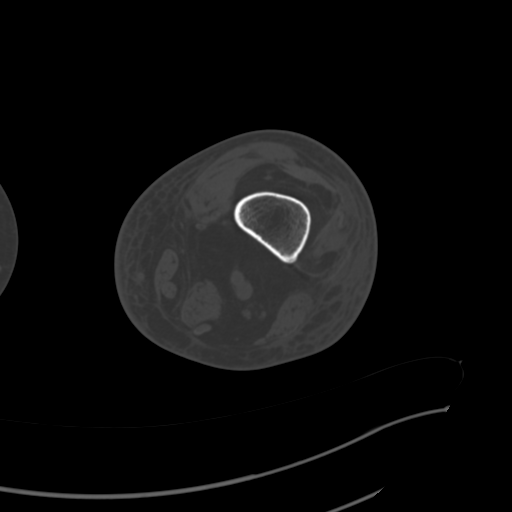
[im 39/288  soft-tissue]
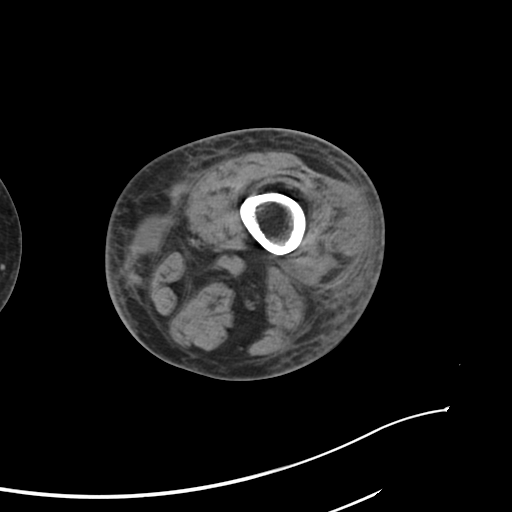
[im 77/288  soft-tissue]
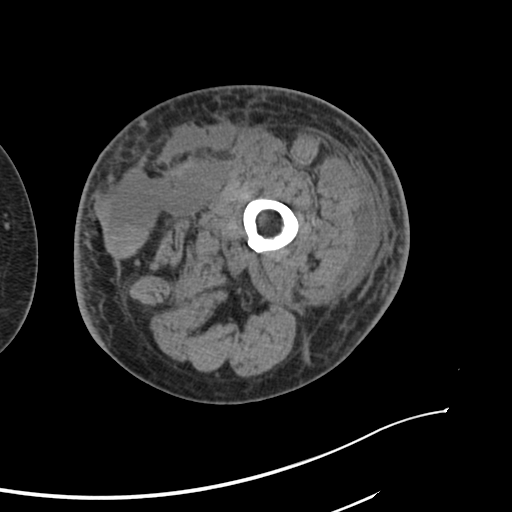
[im 96/288  soft-tissue]
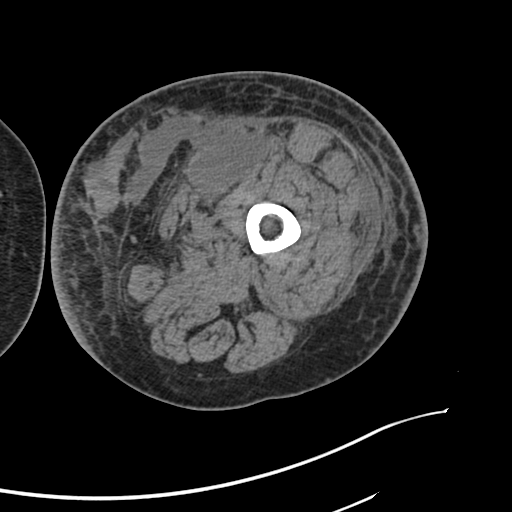
[im 115/288  soft-tissue]
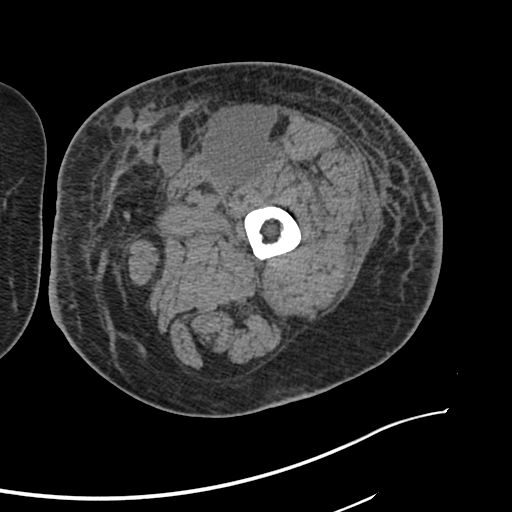
[im 154/288  soft-tissue]
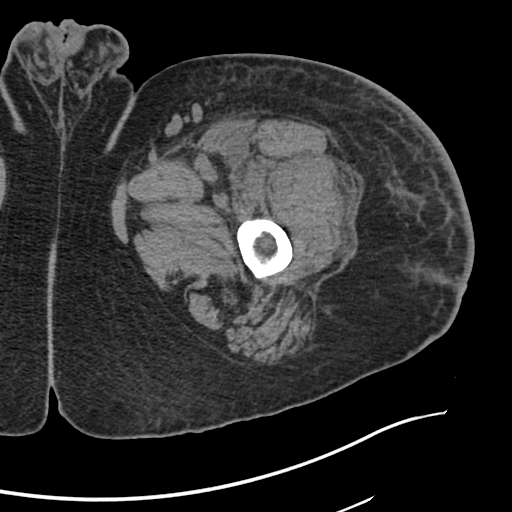
[im 173/288  soft-tissue]
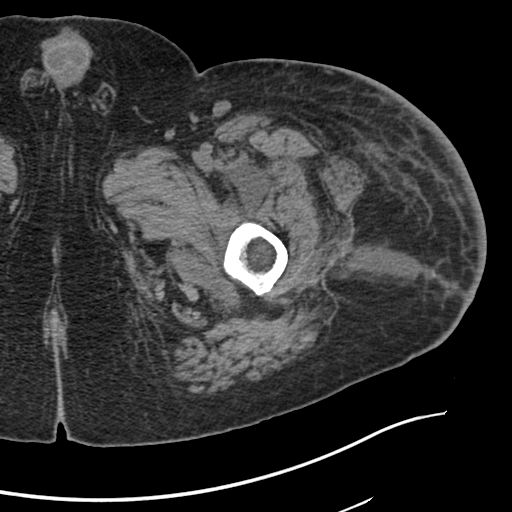
[im 192/288  soft-tissue]
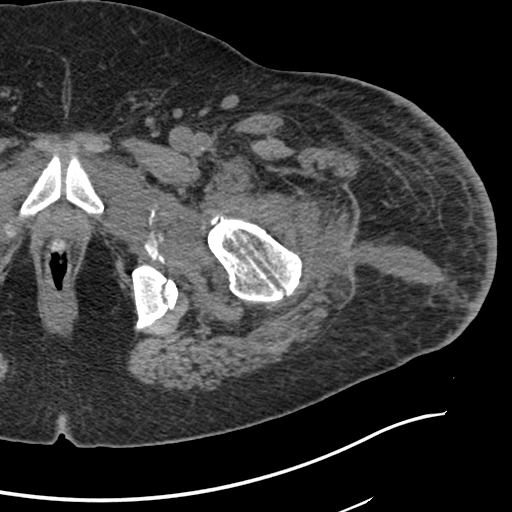
[im 211/288  soft-tissue]
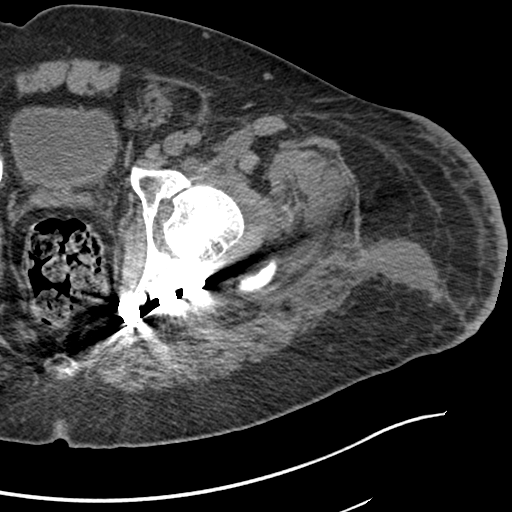
[im 211/288  bone]
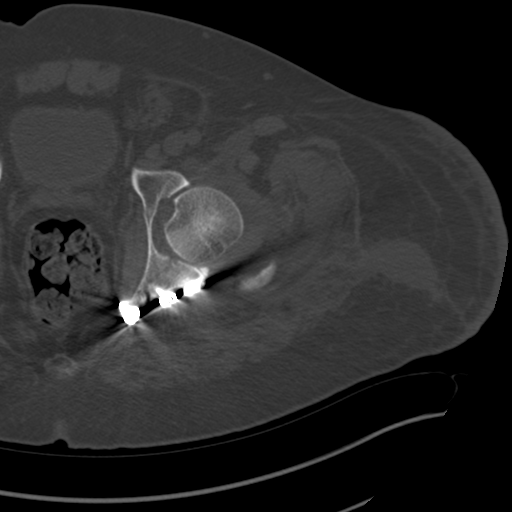
[im 249/288  soft-tissue]
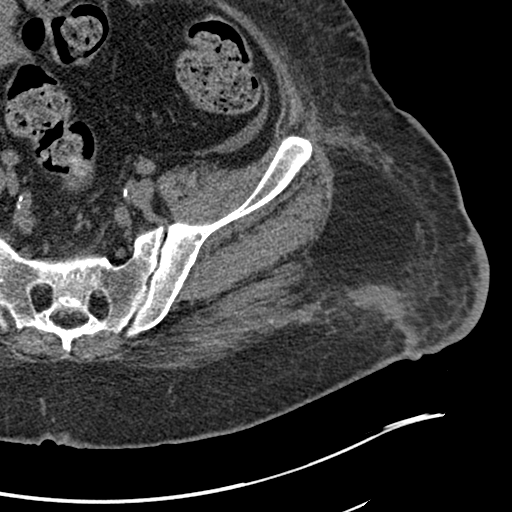
[im 268/288  soft-tissue]
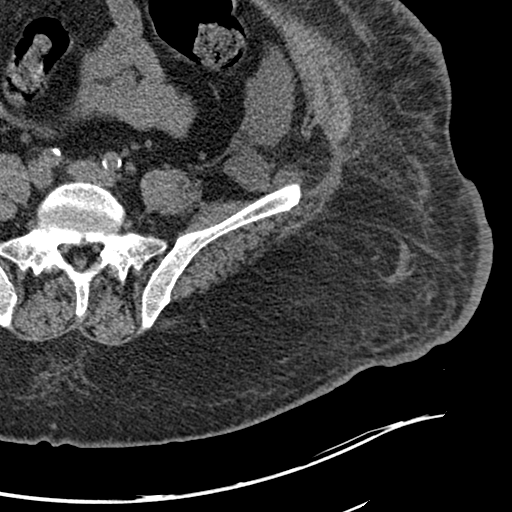

[Series 8: hip 2.0 cor. st · coronal · 0.74mm/px · 3 of 191 slices shown]
[im 64/191  soft-tissue]
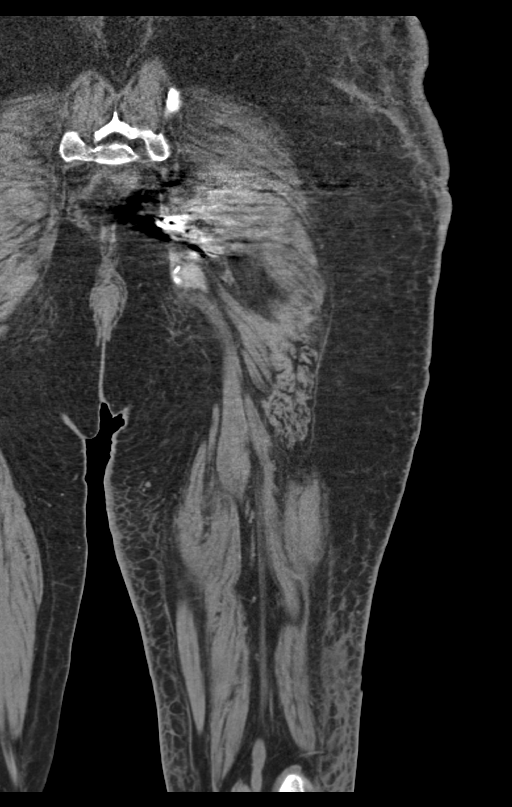
[im 85/191  soft-tissue]
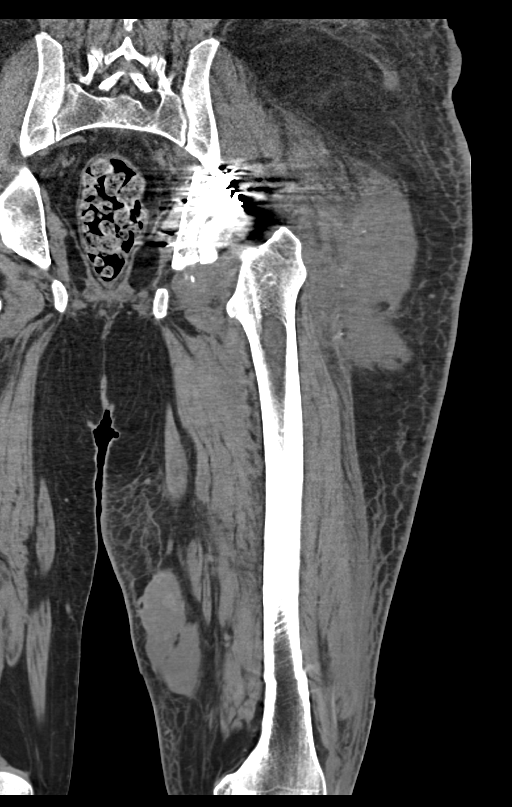
[im 106/191  soft-tissue]
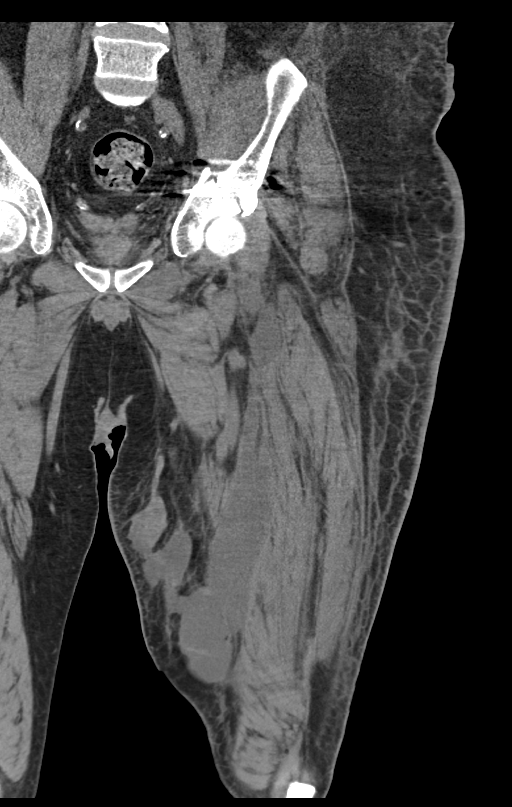

[14 of 46 positions shown; findings below may reference images not displayed]

FINDINGS: Bones/Joint/Cartilage

Interval ORIF of comminuted left acetabular fracture. Osseous
structures are in near anatomic alignment. Left hip joint alignment
is maintained without dislocation. There is a small left hip joint
effusion with a small amount of debris and fracture fragments. SI
joints and pubic symphysis appear intact without diastasis. The left
femur appears intact without fracture. No cortical destruction or
periostitis.

Ligaments

Suboptimally assessed by CT.

Muscles and Tendons

No acute myotendinous abnormality.

Soft tissues

There is a fluid collection at the lateral aspect of thigh at
surgical incision site measuring 5.2 x 2.4 x 19.6 cm (volume = 130
cm^3) (series 2, image 85; series 8, image 112). Collection is
predominantly within the subcutaneous soft tissues along the
excision site but does appear to have a component tracking toward
the greater trochanter through surgical fascial defect.

Focal fluid collection within the anterior compartment of the
proximal thigh along the medial aspect of the proximal rectus
femoris muscle measuring 3.2 x 2.0 x 3.7 cm (volume = 12 cm^3)
(series 2, image 120; series 8, image 83).

Deep fluid collection within the anterior compartment of the thigh
located superficial to the vastus medialis muscle measuring
approximately 5.3 x 4.5 x 15.0 cm (volume = 190 cm^3) (series 2,
image 182; series 8, image 179).

Multilobulated collection within the superficial soft tissues of the
mid to distal left thigh located anteromedially measures
approximately 4.2 x 10.7 x 15.8 cm (volume = 370 cm^3) (series 2,
image 222; series 9, image 84). There is some layering internal
hyperdensity within this collection suggesting layering blood
products. There are a few lobules of fat also noted within the
collection along the non dependent aspect.

There is diffuse superficial edema, most pronounced anteriorly. No
soft tissue gas.
IMPRESSION: 1. There are two fluid collections within the anterior compartment
of the left thigh, the largest is located within the mid to distal
thigh superficial to the vastus medialis muscle measuring up to 15
cm with approximate volume of 190 mL. An additional collection
located within the proximal thigh adjacent to the rectus femoris
muscle measures up to 3.7 cm with an approximate volume of 12 mL.
Collections may reflect hematomas. An infected fluid collection
would be difficult to differentiate by imaging alone. No gas is
present within the collections.
2. Large multilobulated superficial collection at the anteromedial
aspect of the mid to distal thigh overlying the vastus medialis
muscle measuring up to 15.8 cm in length with an approximate volume
of 370 mL. Layering hyperdense component within this collection
suggestive of a hematoma.
3. Fluid collection within the superficial soft tissues along the
lateral incisional site of the proximal left thigh measuring up to
20 cm in length with an approximate volume of 130 mL, suggestive of
a postoperative seroma or hematoma.
4. Interval ORIF of comminuted left acetabular fracture in good
anatomic alignment.

## 2019-06-15 MED ORDER — ALPRAZOLAM 0.25 MG PO TABS
0.2500 mg | ORAL_TABLET | Freq: Once | ORAL | Status: AC
  Administered 2019-06-15: 0.25 mg via ORAL
  Filled 2019-06-15: qty 1

## 2019-06-15 MED ORDER — WARFARIN SODIUM 1 MG PO TABS
1.0000 mg | ORAL_TABLET | Freq: Once | ORAL | Status: AC
  Administered 2019-06-15: 1 mg via ORAL
  Filled 2019-06-15: qty 1

## 2019-06-15 NOTE — Progress Notes (Signed)
Occupational Therapy Session Note  Patient Details  Name: Max Pittman. MRN: [id number] Date of Birth: 1975-03-08  Today's Date: 06/15/2019  Session 1 OT Individual Time: 3953-2023 OT Individual Time Calculation (min): 27 min   Session 2 OT Individual Time: 3435-6861 OT Individual Time Calculation (min): 60 min    Short Term Goals: Week 2:  OT Short Term Goal 1 (Week 2): LTG=STG 2/2 ELOS  Skilled Therapeutic Interventions/Progress Updates:  Session 1   Pt greeted semi-reclined in bed reporting continued 10/10 pain in L knee. Pt reported he could not even tolerate sitting EOB with earlier PT session and was also unable to stand. Pt declined any OOB activity. OT offered bed level bathing/dressing but pt declined. Pt set-up for toothbrushing task in bed. OT tried to engage pt in L knee extension, but unable to tolerate. Pt completed 10 quad sets without pain. OT placed ice back on L knee for pain management. Educated on how this pain and his limited mobility with it, may affect his planned dc for Saturday. Pt verbalized understanding. Pt left semi-reclined in bed with bed alarm on and needs met.  Session 2 Pt greeted semi-reclined in bed and agreeable to OT treatment session. Pt reported pain was a little better this afternoon and waned to try to get to wc. Pt needed extended time. HOB raised, and leg lifter with min A to advance LLE towards EOB. Pt completed lateral scooting transfer from bed to wc on L side with more than reasonable amount of time and bed raised, with min A. Upon first scoot, pt half way between bed and wc and needed a lot of time and encouragement to work through the rest of the transfer 2/2 pain and anxiety. Pt propelled wc to tub room and pt was able to Facetime his wife for family education. OT demonstrated tub transfer to wife, discussed bathroom transfers and BADL modifications using long handled AE. OT also reviewed posterior hip precautions with pt and his spouse.  OT had pt bring up [employer] on his phone and we found the correct bariatric tub transfer bench he needs to order and texted that link to his wife. Pt completed 5 mins  On [employer] arm bike on level 6. Pt then propelled wc back to room. Slideboard transfer back to bed on stronger R side with min A. Pt requested to remain sitting EOB for a while. Pt left seated EOB with bed alarm on, call bell in reach, and needs met.    Therapy Documentation Precautions:  Precautions Precautions: Fall, Posterior Hip Precaution Booklet Issued: No Restrictions Weight Bearing Restrictions: Yes LLE Weight Bearing: Touchdown weight bearing Pain: Pain Assessment Pain Scale: 0-10 Pain Score: 10  Pain Type: Acute pain Pain Location: Knee Pain Orientation: Left Pain Descriptors / Indicators: Stabbing Pain Frequency: Constant Pain Onset: On-going Patients Stated Pain Goal: 2 Pain Intervention(s): Repositioned;rest ADL: ADL Eating: Not assessed Grooming: Setup Where Assessed-Grooming: Bed level Upper Body Bathing: Not assessed Lower Body Bathing: Not assessed Upper Body Dressing: Maximal assistance Where Assessed-Upper Body Dressing: Bed level Lower Body Dressing: Maximal assistance Where Assessed-Lower Body Dressing: Bed level Toileting: Not assessed Toilet Transfer: Not assessed Tub/Shower Transfer: Not assessed ADL Comments: Eval significantly limited due to pts increased pain      Therapy/Group: Individual Therapy  Valma Cava 06/15/2019, 2:06 PM

## 2019-06-15 NOTE — Progress Notes (Signed)
Physical Therapy Discharge Summary  Patient Details  Name: Max Pittman. MRN: [id number] Date of Birth: 08-Dec-1975   Patient has met 6 of 9 long term goals due to improved activity tolerance, improved balance, improved postural control, increased strength, increased range of motion, decreased pain and ability to compensate for deficits.  Patient to discharge at a wheelchair level [place].   Patient's care partner is independent to provide the necessary physical assistance at discharge.  Reasons goals not met: Patient with increased pain and fluid in L thigh causing limitations with mobility his last few days in CIR. Requires min A with car transfers and limited gait 5-10 ft due to decreased tolerance from L LE pain. Patient's wife has agreed that she can provide this level of assistance and patient and his wife state that they are comfortable with the patient discharging home at this level.  Recommendation:  Patient will benefit from ongoing skilled PT services in home health setting to continue to advance safe functional mobility, address ongoing impairments in balance, strength, ROM, functional mobility, gait training, maintaining precautions, patient/caregiver education, and minimize fall risk.  Equipment: 20"x18" w/c with B ELRs, RW  Reasons for discharge: treatment goals met  Patient/family agrees with progress made and goals achieved: Yes  PT Discharge Precautions/Restrictions Precautions Precautions: Fall Restrictions Weight Bearing Restrictions: Yes LLE Weight Bearing: Touchdown weight bearing Vision/Perception  Perception Perception: Within Functional Limits Praxis Praxis: Intact  Cognition Overall Cognitive Status: Within Functional Limits for tasks assessed Focused Attention: Appears intact Sustained Attention: Appears intact Selective Attention: Appears intact Alternating Attention: Appears intact Memory: Appears intact Awareness: Appears  intact Safety/Judgment: Impaired Comments: Has difficulty maintaining precautions and following cues for safe mobility with TDWB, improved throughout stay at CIR Sensation Sensation Light Touch: Appears Intact Coordination Gross Motor Movements are Fluid and Coordinated: No Fine Motor Movements are Fluid and Coordinated: Yes Coordination and Movement Description: impaired due to L LE WBing restrictions and pain with guarded movements Motor  Motor Motor: Other (comment) Motor - Discharge Observations: continued LLE weakness and pain  Mobility Bed Mobility Supine to Sit: Supervision/Verbal cueing Sit to Supine: Supervision/Verbal cueing Transfers Transfers: Sit to Stand;Stand to Sit;Stand Pivot Transfers Sit to Stand: Supervision/Verbal cueing Stand to Sit: Supervision/Verbal cueing Stand Pivot Transfers: Supervision/Verbal cueing Transfer (Assistive device): Rolling walker Locomotion  Gait Ambulation: Yes Gait Assistance: Contact Guard/Touching assist Gait Distance (Feet): 8 Feet(limited by pain) Assistive device: Rolling walker Gait Gait: Yes Gait Pattern: Impaired Gait Pattern: (hop-to gait pattern on R, L TDWB) Gait velocity: decreased Stairs / Additional Locomotion Stairs: No(patient with ramped entrance) Wheelchair Mobility Wheelchair Mobility: Yes Wheelchair Assistance: Independent with Camera operator: Both upper extremities Wheelchair Parts Management: Needs assistance Distance: 150'  Trunk/Postural Assessment  Cervical Assessment Cervical Assessment: Exceptions to WFL(forward head) Thoracic Assessment Thoracic Assessment: Exceptions to WFL(rounded shoulders) Lumbar Assessment Lumbar Assessment: Exceptions to WFL(posterior pelvic tilt in relaxed sitting) Postural Control Postural Control: Deficits on evaluation Postural Limitations: decreased due to L LE TDWB and need for RW  Balance Balance Balance Assessed: Yes Static Sitting  Balance Static Sitting - Balance Support: Bilateral upper extremity supported;Feet unsupported Static Sitting - Level of Assistance: 6: Modified independent (Device/Increase time) Dynamic Sitting Balance Dynamic Sitting - Balance Support: Feet supported Dynamic Sitting - Level of Assistance: 6: Modified independent (Device/Increase time) Static Standing Balance Static Standing - Balance Support: During functional activity;Bilateral upper extremity supported Static Standing - Level of Assistance: 5: Stand by assistance Dynamic Standing Balance Dynamic Standing -  Balance Support: During functional activity Dynamic Standing - Level of Assistance: 5: Stand by assistance(CGA) Extremity Assessment  RUE Assessment RUE Assessment: Within Functional Limits Active Range of Motion (AROM) Comments: WNL General Strength Comments: 4-/5 grossly LUE Assessment LUE Assessment: Within Functional Limits(edematous and ecchymosis from polytrauma) Active Range of Motion (AROM) Comments: WNL, noted increased pain with shoulder flexion General Strength Comments: 4-/5 grossly RLE Assessment RLE Assessment: Within Functional Limits Active Range of Motion (AROM) Comments: WFL for all functional mobility General Strength Comments: WFL for all functional mobility to maintain TDWB on L LE using a RW LLE Assessment LLE Assessment: Exceptions to [facility] Passive Range of Motion (PROM) Comments: limited in hip and knee due to pain (reduced ROM prior to d/c due to increased fluid in L thigh) - WFL for ankle General Strength Comments: provided active assist as movements limited by pain and weakness    Marquerite Forsman L Loyalty Brashier PT, DPT  06/18/2019, 6:01 AM
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
## 2019-06-15 NOTE — Progress Notes (Signed)
Physical Therapy Session Note  Patient Details  Name: Max Pittman. MRN: [id number] Date of Birth: 27-Dec-1975  Today's Date: 06/15/2019 PT Individual Time: 0950-1030 PT Individual Time Calculation (min): 40 min   Short Term Goals: Week 2:  PT Short Term Goal 1 (Week 2): STG=LTG due to ELOS.  Skilled Therapeutic Interventions/Progress Updates:    Pt received supine in bed continuing to report L LE pain (details below) but agreeable to therapy session stating pain is better than yesterday and he wants to try to get OOB. Supine>sitting R EOB, HOB maximally elevated and relying on bedrails, using leg lifter for LLE all with close supervision and increased time. Pt requires frequented seated rest breaks for pain management -  cuing for breathing and relaxation strategies. Attempted sit<>stand elevated EOB<>RW x3 trials with pt unable to clear hips until 3rd attempt but due to increased L knee pain immediately returned to sitting - min assist throughout. Discussed upcoming D/C with pt reporting he still wants to go home despite recent decline in function, with pt acknowledging the decline stating "I feel I've gone backwards." However, pt also reports that his significant other is unable to provide much physical assist. Seated EOB L LE active ROM via heel slides x8 reps to pt tolerance - almost able to get to 90 degrees of knee flexion, able to get to full extension. Sit>supine, HOB maximally elevated and relying on bedrails, with min assist for L LE management into the bed and pt relying heavily on bedrails for trunk movement. Supine scoot towards HOB with bed in trendelenburg and pt using bedrails with supervision. Pt left supine in bed with needs in reach and bed alarm on. RN aware of pt's increased L LE pain.  Therapy Documentation Precautions:  Precautions Precautions: Fall, Posterior Hip Precaution Booklet Issued: No Restrictions Weight Bearing Restrictions: Yes LLE Weight Bearing:  Touchdown weight bearing  Pain: L LE pain located in the following 3 locaitons: 1) medial thigh 2) superior medial to patella and 3) inferior lateral to patella - provided rest breaks, repositioning, distraction, breathing and relaxation strategies for pain management   Therapy/Group: Individual Therapy  Tawana Scale, PT, DPT 06/15/2019, 7:59 AM

## 2019-06-15 NOTE — Progress Notes (Addendum)
Patient ID: Max Pittman., male   DOB: 10-31-1975, 44 y.o.   MRN: [id number]   Unable to obtain home health due to [facility] and liability.   SW called [name] [phone]) to inform on no [facility] services available. She expressed discontent about this process. Her primary concern is if pt is able to functionally get up and get to bathroom on his own as she will not change a BSC. SW met with pt and called [name] to discuss challenges with obtaining HH. Pt admits that he has challenges with getting out of bed due to left leg pain and using RW to walk to bathroom. She expressed the same concerns about his functional abilities, her inability to lift him if he were to fall, and his use of BSC. SW to follow-up with pt and [name] tomorrow after they have had more time to discuss the best discharge plan.  Pt is possible SNF if unable to d/c to home. SW spoke with dialysis coordinator Terri Piedra to inform on above, and may need to remain on HD. SW informed medical team.   SW will follow-up tomorrow on d/c plan.   Loralee Pacas, MSW, [place] Office: [phone] Cell: [phone] Fax: [phone]
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
## 2019-06-15 NOTE — Discharge Summary (Signed)
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 acetabular fracture in good anatomic alignment. Electronically Signed   By: Davina Poke D.O.   On: 06/15/2019 15:51   EP PPM/ICD IMPLANT  Result Date: 05/25/2019 SURGEON:  [name], MD   PREPROCEDURE DIAGNOSIS:  Cryptogenic Stroke   POSTPROCEDURE DIAGNOSIS:  Cryptogenic Stroke    PROCEDURES:  1. Implantable loop recorder implantation   INTRODUCTION:  Kailer Heindel. is a 44 y.o. male with a history of unexplained stroke who presents today for implantable loop implantation.  The patient has had a cryptogenic stroke.  Despite an extensive workup by neurology, no reversible causes have been identified.  he has worn telemetry during which he did not have arrhythmias.  There is significant concern for possible atrial fibrillation as the cause for the patients stroke.  The patient therefore presents today for implantable loop implantation.   DESCRIPTION OF PROCEDURE:  Informed written consent was obtained.  The patient required no sedation for the procedure today.  The patients left chest was prepped and draped. Mapping over the patient's chest was performed to identify the appropriate ILR site.  This area was found to be the left  parasternal region over the 3rd-4th intercostal space.  The skin overlying this region was infiltrated  with lidocaine for local analgesia.  A 0.5-cm incision was made at the implant site.  A subcutaneous ILR pocket was fashioned using a combination of sharp and blunt dissection.  A Medtronic Reveal Linq model [id number] implantable loop recorder was then placed into the pocket R waves were very prominent and measured > 0.2 mV. EBL<1 ml.  Steri- Strips and a sterile dressing were then applied.  There were no early apparent complications.   CONCLUSIONS:  1. Successful implantation of a Medtronic Reveal LINQ implantable loop recorder for cryptogenic stroke as well as prior syncope  2. No early apparent complications. [name] MD, [facility] 05/25/2019 10:54 AM   IR Fluoro Guide CV Line Right  Result Date: 05/23/2019 CLINICAL DATA:  End-stage renal disease, trauma and need for tunneled hemodialysis catheter. EXAM: TUNNELED CENTRAL VENOUS HEMODIALYSIS CATHETER PLACEMENT WITH ULTRASOUND AND FLUOROSCOPIC GUIDANCE ANESTHESIA/SEDATION: 1.0 mg IV Versed; 50 mcg IV Fentanyl. Total Moderate Sedation Time:   17 minutes. The patient's level of consciousness and physiologic status were continuously monitored during the procedure by Radiology nursing. MEDICATIONS: 2 g IV Ancef. FLUOROSCOPY TIME:  30 seconds.  4.6 mGy. PROCEDURE: The procedure, risks, benefits, and alternatives were explained to the patient. Questions regarding the procedure were encouraged and answered. The patient understands and consents to the procedure. A timeout was performed prior to initiating the procedure. The right neck and chest were prepped with chlorhexidine in a sterile fashion, and a sterile drape was applied covering the operative field. Maximum barrier sterile technique with sterile gowns and gloves were used for the procedure. Local anesthesia was provided with 1% lidocaine. Ultrasound was utilized to confirm patency of the right internal jugular vein. After creating a small venotomy incision, a 21 gauge needle was advanced into the right internal  jugular vein under direct, real-time ultrasound guidance. Ultrasound image documentation was performed. After securing guidewire access, an 8 Fr dilator was placed. A J-wire was kinked to measure appropriate catheter length. A Palindrome tunneled hemodialysis catheter measuring 19 cm from tip to cuff was chosen for placement. This was tunneled in a retrograde fashion from the chest wall to the venotomy incision. At the venotomy, serial dilatation was performed and a 16 Fr peel-away sheath was placed over a guidewire. The catheter was then placed through the sheath and the sheath removed. Final catheter positioning was confirmed and documented with a fluoroscopic spot image. The catheter was aspirated, flushed  with saline, and injected with appropriate volume heparin dwells. The venotomy incision was closed with subcuticular 4-0 Vicryl. Dermabond was applied to the incision. The catheter exit site was secured with 0-Prolene retention sutures. COMPLICATIONS: None.  No pneumothorax. FINDINGS: After catheter placement, the tip lies in the right atrium. The catheter aspirates normally and is ready for immediate use. IMPRESSION: Placement of tunneled hemodialysis catheter via the right internal jugular vein. The catheter tip lies in the right atrium. The catheter is ready for immediate use. Electronically Signed   By: Aletta Edouard M.D.   On: 05/23/2019 16:20   IR US Guide Vasc Access Right  Result Date: 05/23/2019 CLINICAL DATA:  End-stage renal disease, trauma and need for tunneled hemodialysis catheter. EXAM: TUNNELED CENTRAL VENOUS HEMODIALYSIS CATHETER PLACEMENT WITH ULTRASOUND AND FLUOROSCOPIC GUIDANCE ANESTHESIA/SEDATION: 1.0 mg IV Versed; 50 mcg IV Fentanyl. Total Moderate Sedation Time:   17 minutes. The patient's level of consciousness and physiologic status were continuously monitored during the procedure by Radiology nursing. MEDICATIONS: 2 g IV Ancef. FLUOROSCOPY TIME:  30 seconds.  4.6 mGy. PROCEDURE:  The procedure, risks, benefits, and alternatives were explained to the patient. Questions regarding the procedure were encouraged and answered. The patient understands and consents to the procedure. A timeout was performed prior to initiating the procedure. The right neck and chest were prepped with chlorhexidine in a sterile fashion, and a sterile drape was applied covering the operative field. Maximum barrier sterile technique with sterile gowns and gloves were used for the procedure. Local anesthesia was provided with 1% lidocaine. Ultrasound was utilized to confirm patency of the right internal jugular vein. After creating a small venotomy incision, a 21 gauge needle was advanced into the right internal jugular vein under direct, real-time ultrasound guidance. Ultrasound image documentation was performed. After securing guidewire access, an 8 Fr dilator was placed. A J-wire was kinked to measure appropriate catheter length. A Palindrome tunneled hemodialysis catheter measuring 19 cm from tip to cuff was chosen for placement. This was tunneled in a retrograde fashion from the chest wall to the venotomy incision. At the venotomy, serial dilatation was performed and a 16 Fr peel-away sheath was placed over a guidewire. The catheter was then placed through the sheath and the sheath removed. Final catheter positioning was confirmed and documented with a fluoroscopic spot image. The catheter was aspirated, flushed with saline, and injected with appropriate volume heparin dwells. The venotomy incision was closed with subcuticular 4-0 Vicryl. Dermabond was applied to the incision. The catheter exit site was secured with 0-Prolene retention sutures. COMPLICATIONS: None.  No pneumothorax. FINDINGS: After catheter placement, the tip lies in the right atrium. The catheter aspirates normally and is ready for immediate use. IMPRESSION: Placement of tunneled hemodialysis catheter via the right internal jugular vein. The  catheter tip lies in the right atrium. The catheter is ready for immediate use. Electronically Signed   By: Aletta Edouard M.D.   On: 05/23/2019 16:20   DG CHEST PORT 1 VIEW  Result Date: 05/22/2019 CLINICAL DATA:  Multiple rib fractures. EXAM: PORTABLE CHEST 1 VIEW COMPARISON:  05/20/2019 FINDINGS: Mild worsening of paramediastinal atelectasis. No evidence of pneumothorax. No lobar collapse or effusion. Multiple bilateral nondisplaced rib fractures as shown previously. IMPRESSION: Slight worsening of paramediastinal atelectasis.  No pneumothorax. Electronically Signed   By: Nelson Chimes M.D.   On: 05/22/2019 13:06   DG Chest Port 1 View  Result Date: 05/20/2019 CLINICAL DATA:  Pain after trauma EXAM: PORTABLE CHEST 1 VIEW COMPARISON:  March 21, 2019 FINDINGS: Evaluation is limited due to the portable technique. The heart hila are unremarkable. Prominence of the mediastinum is noted to represent prominent fat in the mediastinum on the CT scan of the chest from today. No pneumothorax. No nodules or masses. No pulmonary infiltrates. No acute abnormalities. IMPRESSION: No acute abnormalities identified. The patient has had a CT of the chest today. Electronically Signed   By: Dorise Bullion III M.D   On: 05/20/2019 15:37   DG Knee Left Port  Result Date: 05/20/2019 CLINICAL DATA:  44 year old male with left lower extremity trauma. EXAM: LEFT FEMUR PORTABLE 1 VIEW; PORTABLE LEFT KNEE - 1-2 VIEW COMPARISON:  None. FINDINGS: Evaluation is limited due to body habitus. There is a displaced appearing fracture of the left pelvic bone with involvement of the left ischium. There is superiorly displaced fracture of the left acetabulum and left hip. The femoral head appears in anatomic alignment with the inferior aspect of the acetabulum but displaced superiorly and superimposed over the left anterior inferior iliac spine. Evaluation of the fracture is very limited due to body habitus. Further evaluation with CT  is recommended. No acute fracture or dislocation identified at the knee. No joint effusion. The soft tissues are unremarkable. IMPRESSION: 1. Displaced fractures of the left pelvic bone and left acetabulum. Further evaluation with CT is recommended. 2. No fracture or dislocation of the left knee. Electronically Signed   By: Anner Crete M.D.   On: 05/20/2019 18:34   DG Abd Portable 1V  Result Date: 05/31/2019 CLINICAL DATA:  Abdominal pain EXAM: PORTABLE ABDOMEN - 1 VIEW COMPARISON:  May 28, 2019 FINDINGS: Apparent peritoneal dialysis catheter tip in right pelvis laterally. Moderate stool in colon. No bowel dilatation or air-fluid level to suggest bowel obstruction. No free air. Postoperative change left acetabulum. Surgical clips in gallbladder fossa region. Lung bases clear. IMPRESSION: Moderate stool in colon. No bowel obstruction or free air. Lung bases clear. Dialysis catheter tip lateral right pelvis. Electronically Signed   By: Lowella Grip III M.D.   On: 05/31/2019 10:45   DG Abd Portable 1V  Result Date: 05/28/2019 CLINICAL DATA:  Ileus.  Abdominal distension. EXAM: PORTABLE ABDOMEN - 1 VIEW COMPARISON:  05/27/2019 and pelvis 05/20/2019 FINDINGS: Bowel gas pattern is nonobstructive with air present throughout the colon. Peritoneal dialysis catheter present with tip over the right lower quadrant. Fixation hardware over the left pelvis/acetabulum intact fixating patient's known acetabular fracture. Degenerative change of the right hip. IMPRESSION: Nonobstructive bowel gas pattern. Electronically Signed   By: Marin Olp M.D.   On: 05/28/2019 11:08   DG Abd Portable 1V  Result Date: 05/27/2019 CLINICAL DATA:  Confirm nasogastric tube placement. EXAM: PORTABLE ABDOMEN - 1 VIEW COMPARISON:  None. FINDINGS: Tip and side port of the enteric tube below the diaphragm in the stomach. Mild gaseous distention of colon in the upper abdomen. Cholecystectomy clips. Implanted loop recorder is partially  visualized. IMPRESSION: Tip and side port of the enteric tube below the diaphragm in the stomach. Electronically Signed   By: Keith Rake M.D.   On: 05/27/2019 17:07   DG C-Arm 1-60 Min  Result Date: 05/22/2019 CLINICAL DATA:  Open reduction and internal fixation of the left acetabulum. EXAM: DG C-ARM 1-60 MIN CONTRAST:  None FLUOROSCOPY TIME:  Fluoroscopy Time:  27 seconds Radiation Exposure Index (if provided by the fluoroscopic device): N/A Number of Acquired Spot Images: 8 COMPARISON:  None. FINDINGS: A nondisplaced fracture is seen involving the superior lateral  aspect of the left acetabulum. Initially, four radiopaque surgical needles are seen overlying the left acetabulum. Subsequent placement of four radiopaque fixation plates, with multiple radiopaque fixation screws, is seen along the superolateral aspect of the left acetabulum. Soft tissue structures are unremarkable. IMPRESSION: Status post open reduction and internal fixation of left acetabular fracture. Electronically Signed   By: Virgina Norfolk M.D.   On: 05/22/2019 21:54   ECHOCARDIOGRAM COMPLETE  Result Date: 05/22/2019    ECHOCARDIOGRAM REPORT   Patient Name:   Max Pittman. Date of Exam: 05/22/2019 Medical Rec #:  [id number]            Height:       69.0 in Accession #:    [id number]           Weight:       274.0 lb Date of Birth:  06-18-1975            BSA:          2.362 m Patient Age:    43 years             BP:           138/70 mmHg Patient Gender: M                    HR:           92 bpm. Exam Location:  Inpatient Procedure: 2D Echo, Cardiac Doppler and Color Doppler                                MODIFIED REPORT:  This report was modified by Cherlynn Kaiser MD on 05/22/2019 due to conclusion                                     added.  Indications:     CVA  History:         Patient has no prior history of Echocardiogram examinations.                  Stroke; Risk Factors:Hypertension and Diabetes. ESRD.  Sonographer:      Dustin Flock Referring Phys:  [place] Diagnosing Phys: Cherlynn Kaiser MD  Sonographer Comments: Image acquisition challenging due to uncooperative patient. IMPRESSIONS  1. Left ventricular ejection fraction, by estimation, is 60 to 65%. The left ventricle has normal function. The left ventricle has no regional wall motion abnormalities. There is moderate left ventricular hypertrophy. Indeterminate diastolic filling due  to E-A fusion.  2. Right ventricular systolic function is normal. The right ventricular size is normal. There is normal pulmonary artery systolic pressure. The estimated right ventricular systolic pressure is 56.2 mmHg.  3. The mitral valve is normal in structure. Mild mitral valve regurgitation. No evidence of mitral stenosis.  4. The aortic valve is normal in structure. Aortic valve regurgitation is not visualized. No aortic stenosis is present.  5. The inferior vena cava is normal in size with greater than 50% respiratory variability, suggesting right atrial pressure of 3 mmHg. Conclusion(s)/Recommendation(s): No intracardiac source of embolism detected on this transthoracic study. A transesophageal echocardiogram is recommended to exclude cardiac source of embolism if clinically indicated. FINDINGS  Left Ventricle: Left ventricular ejection fraction, by estimation, is 60 to 65%. The left ventricle has normal function. The left ventricle has no regional  wall motion abnormalities. The left ventricular internal cavity size was normal in size. There is  moderate left ventricular hypertrophy. Indeterminate diastolic filling due to E-A fusion. Right Ventricle: The right ventricular size is normal. No increase in right ventricular wall thickness. Right ventricular systolic function is normal. There is normal pulmonary artery systolic pressure. The tricuspid regurgitant velocity is 2.57 m/s, and  with an assumed right atrial pressure of 3 mmHg, the estimated right ventricular systolic  pressure is 49.7 mmHg. Left Atrium: Left atrial size was normal in size. Right Atrium: Right atrial size was normal in size. Pericardium: There is no evidence of pericardial effusion. Mitral Valve: The mitral valve is normal in structure. Normal mobility of the mitral valve leaflets. Mild mitral valve regurgitation. No evidence of mitral valve stenosis. Tricuspid Valve: The tricuspid valve is normal in structure. Tricuspid valve regurgitation is not demonstrated. No evidence of tricuspid stenosis. Aortic Valve: The aortic valve is normal in structure. Aortic valve regurgitation is not visualized. No aortic stenosis is present. Pulmonic Valve: The pulmonic valve was not well visualized. Pulmonic valve regurgitation is not visualized. No evidence of pulmonic stenosis. Aorta: The aortic root is normal in size and structure. Venous: The inferior vena cava is normal in size with greater than 50% respiratory variability, suggesting right atrial pressure of 3 mmHg. IAS/Shunts: No atrial level shunt detected by color flow Doppler.  LEFT VENTRICLE PLAX 2D LVIDd:         4.30 cm  Diastology LVIDs:         3.00 cm  LV e' lateral:   9.68 cm/s LV PW:         1.40 cm  LV E/e' lateral: 8.7 LV IVS:        1.50 cm  LV e' medial:    7.29 cm/s LVOT diam:     2.30 cm  LV E/e' medial:  11.6 LV SV:         71 LV SV Index:   30 LVOT Area:     4.15 cm  RIGHT VENTRICLE RV Basal diam:  2.60 cm RV S prime:     12.10 cm/s TAPSE (M-mode): 2.8 cm LEFT ATRIUM           Index       RIGHT ATRIUM           Index LA diam:      3.50 cm 1.48 cm/m  RA Area:     15.00 cm LA Vol (A2C): 37.7 ml 15.96 ml/m RA Volume:   35.30 ml  14.95 ml/m LA Vol (A4C): 55.6 ml 23.54 ml/m  AORTIC VALVE LVOT Vmax:   96.80 cm/s LVOT Vmean:  63.000 cm/s LVOT VTI:    0.171 m  AORTA Ao Root diam: 3.00 cm MITRAL VALVE                TRICUSPID VALVE MV Area (PHT): 7.59 cm     TR Peak grad:   26.4 mmHg MV Decel Time: 100 msec     TR Vmax:        257.00 cm/s MV E velocity:  84.40 cm/s MV A velocity: 111.00 cm/s  SHUNTS MV E/A ratio:  0.76         Systemic VTI:  0.17 m                             Systemic Diam: 2.30 cm Cherlynn Kaiser MD Electronically signed by Nadean Corwin  Margaretann Loveless MD Signature Date/Time: 05/22/2019/3:59:14 PM    Final (Updated)    ECHO TEE  Result Date: 05/25/2019    TRANSESOPHOGEAL ECHO REPORT   Patient Name:   Max Pittman. Date of Exam: 05/25/2019 Medical Rec #:  [id number]            Height:       69.0 in Accession #:    [id number]           Weight:       294.8 lb Date of Birth:  10/08/1975            BSA:          2.436 m Patient Age:    50 years             BP:           71/37 mmHg Patient Gender: M                    HR:           82 bpm. Exam Location:  Inpatient Procedure: Transesophageal Echo, Color Doppler, Cardiac Doppler and Saline            Contrast Bubble Study Indications:     stroke 434.91  History:         Patient has prior history of Echocardiogram examinations, most                  recent 05/22/2019.  Sonographer:     Johny Chess Referring Phys:  [id number] [place] Diagnosing Phys: Ena Dawley MD PROCEDURE: The patient was intubated. The transesophogeal probe was passed without difficulty through the esophogus of the patient. Sedation performed by different physician. The patient was monitored while under deep sedation. Anesthestetic sedation was  provided intravenously by Anesthesiology: 160mg  of Propofol. The patient's vital signs; including heart rate, blood pressure, and oxygen saturation; remained stable throughout the procedure. The patient developed no complications during the procedure. IMPRESSIONS  1. Left ventricular ejection fraction, by estimation, is 60 to 65%. The left ventricle has normal function. The left ventricle has no regional wall motion abnormalities. Left ventricular diastolic function could not be evaluated.  2. Right ventricular systolic function is normal. The right ventricular size is normal.  3. No  thrombus seen in the left atrium, left atrial appedage. Normal emptying and filling velocities.. Left atrial size was mildly dilated. No left atrial/left atrial appendage thrombus was detected.  4. The mitral valve is normal in structure. Mild mitral valve regurgitation. No evidence of mitral stenosis.  5. The aortic valve is normal in structure. Aortic valve regurgitation is not visualized. No aortic stenosis is present.  6. The inferior vena cava is normal in size with greater than 50% respiratory variability, suggesting right atrial pressure of 3 mmHg.  7. Agitated saline contrast bubble study was negative, with no evidence of any interatrial shunt. Conclusion(s)/Recommendation(s): Normal biventricular function without evidence of hemodynamically significant valvular heart disease. No LA/LAA thrombus identified. Negative bubble study for interatrial shunt. No intracardiac source of embolism detected  on this on this transesophageal echocardiogram. EP service notified. The patient will receive a loop recorder. FINDINGS  Left Ventricle: Left ventricular ejection fraction, by estimation, is 60 to 65%. The left ventricle has normal function. The left ventricle has no regional wall motion abnormalities. The left ventricular internal cavity size was normal in size. There is  no left ventricular hypertrophy. Left ventricular diastolic function could not be  evaluated. Right Ventricle: The right ventricular size is normal. No increase in right ventricular wall thickness. Right ventricular systolic function is normal. Left Atrium: No thrombus seen in the left atrium, left atrial appedage. Normal emptying and filling velocities. Left atrial size was mildly dilated. No left atrial/left atrial appendage thrombus was detected. Right Atrium: Right atrial size was normal in size. Pericardium: There is no evidence of pericardial effusion. Mitral Valve: The mitral valve is normal in structure. Normal mobility of the mitral valve  leaflets. Mild mitral valve regurgitation. No evidence of mitral valve stenosis. There is no evidence of mitral valve vegetation. Tricuspid Valve: The tricuspid valve is normal in structure. Tricuspid valve regurgitation is mild . No evidence of tricuspid stenosis. There is no evidence of tricuspid valve vegetation. Aortic Valve: The aortic valve is normal in structure. Aortic valve regurgitation is not visualized. No aortic stenosis is present. There is no evidence of aortic valve vegetation. Pulmonic Valve: The pulmonic valve was normal in structure. Pulmonic valve regurgitation is not visualized. No evidence of pulmonic stenosis. Aorta: The aortic root is normal in size and structure. Venous: The inferior vena cava is normal in size with greater than 50% respiratory variability, suggesting right atrial pressure of 3 mmHg. IAS/Shunts: No atrial level shunt detected by color flow Doppler. Agitated saline contrast was given intravenously to evaluate for intracardiac shunting. Agitated saline contrast bubble study was negative, with no evidence of any interatrial shunt. There  is no evidence of an atrial septal defect. Ena Dawley MD Electronically signed by Ena Dawley MD Signature Date/Time: 05/25/2019/9:25:34 AM    Final    CT EXTREMITY LOWER LEFT WO CONTRAST  Result Date: 06/14/2019 CLINICAL DATA:  Left lower extremity soft tissue infection EXAM: CT OF THE LOWER LEFT EXTREMITY WITHOUT CONTRAST TECHNIQUE: Multidetector CT imaging of the lower left extremity was performed according to the standard protocol. Field of view is from the distal femoral diaphysis through the level of the tibiotalar joint. COMPARISON:  Left knee x-ray 05/20/2019 FINDINGS: Bones/Joint/Cartilage No acute fracture. No dislocation. No bony erosion or periostitis. Mild subchondral cystic changes at the medial patellar facet and patellar apex. Knee joint spaces appear preserved. No knee joint effusion. Ankle mortise is congruent  without significant arthropathy. No tibiotalar joint effusion. Ligaments Suboptimally assessed by CT. Muscles and Tendons Unremarkable appearance of the myotendinous structures. No intramuscular fluid collection. No tenosynovial fluid collection. Soft tissues Circumferential subcutaneous edema throughout the visualized left lower extremity. Layering fluid overlies the superficial investing fascia of the lower extremity musculature. No focal soft tissue ulceration is seen. No soft tissue gas. No deep fascial fluid collections. IMPRESSION: 1. Diffuse left lower extremity subcutaneous fluid and edema. Findings are nonspecific but can be seen in the setting of cellulitis, DVT, venous insufficiency, as well as systemic processes. There is no soft tissue gas or deep fascial fluid. 2. No acute osseous abnormality. Electronically Signed   By: Davina Poke D.O.   On: 06/14/2019 15:01   DG Femur Portable 1 View Left  Result Date: 05/20/2019 CLINICAL DATA:  44 year old male with left lower extremity trauma. EXAM: LEFT FEMUR PORTABLE 1 VIEW; PORTABLE LEFT KNEE - 1-2 VIEW COMPARISON:  None. FINDINGS: Evaluation is limited due to body habitus. There is a displaced appearing fracture of the left pelvic bone with involvement of the left ischium. There is superiorly displaced fracture of the left acetabulum and left hip. The femoral head appears in anatomic alignment with the inferior aspect of the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

## 2019-06-16 ENCOUNTER — Inpatient Hospital Stay (HOSPITAL_COMMUNITY): Payer: Medicare Other | Admitting: Occupational Therapy

## 2019-06-16 ENCOUNTER — Inpatient Hospital Stay (HOSPITAL_COMMUNITY): Payer: Medicare Other

## 2019-06-16 ENCOUNTER — Inpatient Hospital Stay (HOSPITAL_COMMUNITY): Payer: Medicare Other | Admitting: Physical Therapy

## 2019-06-16 LAB — PROTIME-INR
INR: 2.8 — ABNORMAL HIGH (ref 0.8–1.2)
Prothrombin Time: 28.2 seconds — ABNORMAL HIGH (ref 11.4–15.2)

## 2019-06-16 LAB — GLUCOSE, CAPILLARY
Glucose-Capillary: 89 mg/dL (ref 70–99)
Glucose-Capillary: 103 mg/dL — ABNORMAL HIGH (ref 70–99)
Glucose-Capillary: 93 mg/dL (ref 70–99)
Glucose-Capillary: 150 mg/dL — ABNORMAL HIGH (ref 70–99)

## 2019-06-16 MED ORDER — MELATONIN 5 MG PO TABS: 5.0000 mg | ORAL_TABLET | Freq: Every day | ORAL | 0 refills | Status: DC

## 2019-06-16 MED ORDER — WARFARIN SODIUM 2 MG PO TABS
2.0000 mg | ORAL_TABLET | Freq: Every day | ORAL | 3 refills | Status: DC
Start: 2019-06-16 — End: 2019-06-16

## 2019-06-16 MED ORDER — METHOCARBAMOL 500 MG PO TABS: 1000.0000 mg | ORAL_TABLET | Freq: Three times a day (TID) | ORAL | 0 refills | Status: DC

## 2019-06-16 MED ORDER — ASPIRIN 81 MG PO TBEC: 81.0000 mg | DELAYED_RELEASE_TABLET | Freq: Every day | ORAL | Status: DC

## 2019-06-16 MED ORDER — LIDOCAINE 5 % EX PTCH: 1.0000 | MEDICATED_PATCH | CUTANEOUS | 0 refills | Status: DC

## 2019-06-16 MED ORDER — POLYETHYLENE GLYCOL 3350 17 G PO PACK: 17.0000 g | PACK | Freq: Every day | ORAL | 0 refills | Status: DC

## 2019-06-16 MED ORDER — VITAMIN D3 25 MCG PO TABS: 2000.0000 [IU] | ORAL_TABLET | Freq: Two times a day (BID) | ORAL | 0 refills | Status: DC

## 2019-06-16 MED ORDER — BUPROPION HCL ER (SR) 150 MG PO TB12: 150.0000 mg | ORAL_TABLET | Freq: Two times a day (BID) | ORAL | 0 refills | Status: DC

## 2019-06-16 MED ORDER — OMEPRAZOLE 20 MG PO CPDR: 20.0000 mg | DELAYED_RELEASE_CAPSULE | Freq: Every day | ORAL | 0 refills | Status: DC

## 2019-06-16 MED ORDER — LORATADINE 10 MG PO TABS: 10.0000 mg | ORAL_TABLET | Freq: Every day | ORAL | 0 refills | Status: DC

## 2019-06-16 MED ORDER — ATORVASTATIN CALCIUM 40 MG PO TABS: 40.0000 mg | ORAL_TABLET | Freq: Every day | ORAL | 6 refills | Status: DC

## 2019-06-16 MED ORDER — ACETAMINOPHEN 325 MG PO TABS: 650.0000 mg | ORAL_TABLET | Freq: Three times a day (TID) | ORAL | Status: DC

## 2019-06-16 MED ORDER — WARFARIN SODIUM 1 MG PO TABS
1.0000 mg | ORAL_TABLET | Freq: Once | ORAL | Status: AC
  Administered 2019-06-16: 1 mg via ORAL
  Filled 2019-06-16: qty 1

## 2019-06-16 MED ORDER — SEVELAMER CARBONATE 800 MG PO TABS: 2400.0000 mg | ORAL_TABLET | Freq: Three times a day (TID) | ORAL | 1 refills | Status: DC

## 2019-06-16 MED ORDER — WARFARIN SODIUM 1 MG PO TABS: 1.0000 mg | ORAL_TABLET | Freq: Every day | ORAL | 11 refills | Status: DC

## 2019-06-16 MED ORDER — OXYCODONE HCL ER 10 MG PO T12A: 10.0000 mg | EXTENDED_RELEASE_TABLET | Freq: Every day | ORAL | 0 refills | Status: DC

## 2019-06-16 MED ORDER — PIOGLITAZONE HCL 15 MG PO TABS: 15.0000 mg | ORAL_TABLET | Freq: Every day | ORAL | 0 refills | Status: DC

## 2019-06-16 MED ORDER — PREGABALIN 50 MG PO CAPS: 50.0000 mg | ORAL_CAPSULE | Freq: Every day | ORAL | 0 refills | Status: DC

## 2019-06-16 MED ORDER — WARFARIN SODIUM 2 MG PO TABS
2.0000 mg | ORAL_TABLET | Freq: Every day | ORAL | 4 refills | Status: DC
Start: 2019-06-16 — End: 2019-08-21

## 2019-06-16 MED FILL — PIOGLITAZONE HCL 15 MG TABS: 15 | 30 days supply | Qty: 30 | Fill #0

## 2019-06-16 MED FILL — VITAMIN D3 25 MCG TABS: 25 | 15 days supply | Qty: 60 | Fill #0

## 2019-06-16 MED FILL — LORATADINE 10 MG TABLET: 10 | 30 days supply | Qty: 30 | Fill #0

## 2019-06-16 MED FILL — OMEPRAZOLE 20 MG CPDR: 20 | 30 days supply | Qty: 30 | Fill #0

## 2019-06-16 MED FILL — METHOCARBAMOL 500 MG TABS: 500 | 15 days supply | Qty: 90 | Fill #0

## 2019-06-16 MED FILL — OxyCONTIN 10 MG T12A: 10 | 14 days supply | Qty: 14 | Fill #0

## 2019-06-16 MED FILL — WARFARIN SODIUM 2 MG TABLET: 2 | 30 days supply | Qty: 30 | Fill #0

## 2019-06-16 MED FILL — buPROPion HCL ER (SR) 150 M: 150 | 30 days supply | Qty: 60 | Fill #0

## 2019-06-16 MED FILL — ATORVASTATIN CALCIUM 40 MG: 40 | 31 days supply | Qty: 31 | Fill #0

## 2019-06-16 MED FILL — PREGABALIN 50 MG CAPS: 50 | 30 days supply | Qty: 30 | Fill #0

## 2019-06-16 NOTE — Progress Notes (Signed)
Physical Therapy Session Note  Patient Details  Name: Max Pittman. MRN: [id number] Date of Birth: July 10, 1975  Today's Date: 06/16/2019 PT Individual Time: 1962-2297 PT Individual Time Calculation (min): 46 min   Short Term Goals: Week 2:  PT Short Term Goal 1 (Week 2): STG=LTG due to ELOS.  Skilled Therapeutic Interventions/Progress Updates:     Pt received supine in bed and agreeable to therapy. Reports pain in L knee but improved from previous session. PT provides rest breaks as needed during session to manage pain levels. Supine to sit with supervision and increased time required to complete. Sit to stand and stand pivot transfer from bed to [facility] with RW with supervision and verbal cues for sequencing and positioning for safety. PT sets up pt's personal WC for discharge and pt transfer from [facility] to [facility] with similar assistance. Pt propels WC x200' independently. Pt performs transfer training with standing balance component, playing Wii bowling and using RUE for dynamic motion, only using LUE for support on RW. Pt able to stand ~3-4 minute prior to requiring seated rest break. WC transport back to room for time management. Stand pivot transfer back to bed and return to supine with supervision. Pt left supine in bed with all needs within reach.    Therapy/Group: Individual Therapy  Breck Coons, PT, DPT 06/16/2019, 3:52 PM
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
## 2019-06-16 NOTE — Plan of Care (Signed)
  Problem: Consults Goal: RH STROKE PATIENT EDUCATION Description: See Patient Education module for education specifics  Outcome: Progressing Goal: Nutrition Consult-if indicated Outcome: Progressing Goal: Diabetes Guidelines if Diabetic/Glucose > 140 Description: If diabetic or lab glucose is > 140 mg/dl - Initiate Diabetes/Hyperglycemia Guidelines & Document Interventions  Outcome: Progressing   Problem: RH BOWEL ELIMINATION Goal: RH STG MANAGE BOWEL WITH ASSISTANCE Description: STG Manage Bowel with Moderate Assistance. Outcome: Progressing   Problem: RH BLADDER ELIMINATION Goal: RH STG MANAGE BLADDER WITH ASSISTANCE Description: STG Manage Bladder With Moderate Assistance Outcome: Progressing   Problem: RH SKIN INTEGRITY Goal: RH STG SKIN FREE OF INFECTION/BREAKDOWN Description: Patient will verbalize ways to prevent skin breakdown by discharge. Outcome: Progressing Goal: RH STG MAINTAIN SKIN INTEGRITY WITH ASSISTANCE Description: STG Maintain Skin Integrity With Moderate Assistance. Outcome: Progressing Goal: RH STG ABLE TO PERFORM INCISION/WOUND CARE W/ASSISTANCE Description: STG Able To Perform Incision/Wound Care With Moderate Assistance. Outcome: Progressing   Problem: RH SAFETY Goal: RH STG ADHERE TO SAFETY PRECAUTIONS W/ASSISTANCE/DEVICE Description: STG Adhere to Safety Precautions With Moderate Assistance/Device. Outcome: Progressing   Problem: RH KNOWLEDGE DEFICIT Goal: RH STG INCREASE KNOWLEDGE OF DIABETES Description: Patient will verbalize ways to control and lower blood sugar while in the hospital. Outcome: Progressing Goal: RH STG INCREASE KNOWLEDGE OF HYPERTENSION Description: Patient will verbalize ways to prevent high blood pressure in his daily life. Outcome: Progressing Goal: RH STG INCREASE KNOWLEDGE OF DYSPHAGIA/FLUID INTAKE Description: Patient will increase his food intake and remain free from aspiration while in the hospital. Outcome:  Progressing Goal: RH STG INCREASE KNOWLEGDE OF HYPERLIPIDEMIA Description: Patient will verbalize ways to manage high cholesterol levels by discharge. Outcome: Progressing Goal: RH STG INCREASE KNOWLEDGE OF STROKE PROPHYLAXIS Description: Patient will verbalize signs and symptoms of stroke by discharge.  Outcome: Progressing

## 2019-06-16 NOTE — Progress Notes (Signed)
Patient ID: Max Pittman., male   DOB: 1975/05/13, 44 y.o.   MRN: [id number]  SW received updates from medical team, pt xray results would not delay discharge.   Several conversations today with PA, PT,  pt and s/o [name] to discuss concerns related to discharging to home since pt will not be able to get [facility] therefore no continued therapies, pt requiring a clinic follow-up for INR level check, and ensuring this is a safe discharge for pt and family. Pt desires to go home. S/O [name] supports pt decision to return home. Discussed possible scenarios if pt is unable to be successful at home, and both pt and s/o [name] understand pt would need to be readmitted to the hospital, before he can transition to a SNF. S/O [name] will help pt as much as possible, and pt understands he will need to continue to put in effort to be independent, and utilize HEP that will be sent home with him. D/c plan remains for pt to d/c tomorrow with his daughter picking him up at 10am. DME items delivered: w/c, RW. DABSC will be delivered to the home.   *SW updated [employer] on above details.    Loralee Pacas, MSW, [place] Office: [phone] Cell: [phone] Fax: [phone]
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
## 2019-06-16 NOTE — Progress Notes (Signed)
Physical Therapy Session Note  Patient Details  Name: Max Pittman. MRN: [id number] Date of Birth: [birth year]  Today's Date: 06/16/2019 PT Individual Time: 1105-1205 PT Individual Time Calculation (min): 60 min   Short Term Goals: Week 1:  PT Short Term Goal 1 (Week 1): Pt will perform supine<>sit with limited use of bed features and mod assist PT Short Term Goal 1 - Progress (Week 1): Met PT Short Term Goal 2 (Week 1): Pt will perform sit<>stand with CGA PT Short Term Goal 2 - Progress (Week 1): Met PT Short Term Goal 3 (Week 1): Pt will perform bed<>chair transfers with CGA PT Short Term Goal 3 - Progress (Week 1): Met PT Short Term Goal 4 (Week 1): Pt will ambulate at least 657f with min assist PT Short Term Goal 4 - Progress (Week 1): Progressing toward goal PT Short Term Goal 5 (Week 1): Pt will initiate stair navigation PT Short Term Goal 5 - Progress (Week 1): Not progressing(Patient unable to initiate stair training due to pain, plan for ramp placement at home prior to d/c.) Week 2:  PT Short Term Goal 1 (Week 2): STG=LTG due to ELOS. Week 3:     Skilled Therapeutic Interventions/Progress Updates:    PAIN Pt states pain in L knee is decreased from yesterday, now rates as   /10.  Agreeable to treatment session focusing on equipment adjustment, car transfers, gait, dc planning.   Pt propels wc 736fincluding negotiation of tight turns/doorways and parts management w/spervision.  STS from wc repeated x 2 w/cga and cues to maintain TDWBing, transitions slowly.  Walker adjusted for appropriate height/pts personal walker.  Gait 57f80f/min assist for balance, cues for TDWBing LLE, cues not to cross L foot behind RLE due to balance /safety.  Pt w/poor clearance of RLE and loads heavily/poor control.  Car transfer - reviewed safe set up/technique w/pt.  STS from wc to RW w/cga, cues for foot placement/TDWB, SPT to car w/RW and cga, decreased eccentric control w/Stand to sit, cues  for foot placement/TDWB. Once seated pt instructed to scoot deep into seat to prevent excess L hip flexion when lifting LLE into car using loop.  Pt performs slowly and w/cues/supervision. Once seated in car, PA and SW in to discuss issues w/dc planned for tomorrow due to transportation [facility] Pt resistant to temporary SNF placement due to strong desire to return home tomorrow.  Pt educated about risks of decreased mobility, increased falls risk, risk of decreased function/safety, risks associated w/lack of nursing care for bloodwork due to inaccessibilty of Outpatient services /family does not have car/transportation for these.  After extensive discussion w/team, pt agreed to call wife and discuss options.  Pt transferrs car to wc using RW w/max cues for hand placement, min assist for power up from car, cues for TDWBing, use of loop to assist w/LLE mgmnt  Pt propels wc back to room mod I.   Cues for safe set up of wc in preparation for transfer to bed. SPT using RW w/cues for wbing precautions and cga. Pt left seated on edge of bed w/NT checking BS and NT agreed to provide needs/safe set up.   Therapy Documentation Precautions:  Precautions Precautions: Fall Precaution Booklet Issued: No Restrictions Weight Bearing Restrictions: Yes LLE Weight Bearing: Touchdown weight bearing   Therapy/Group: Individual Therapy BarCallie FieldingT [place]21/2021, 12:28 PM
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
## 2019-06-16 NOTE — Progress Notes (Signed)
Patient's last documented BM is 06/12/19. PRN sorbitol given. Will continue to monitor. Amanda Cockayne, LPN

## 2019-06-16 NOTE — Progress Notes (Signed)
Renal Navigator spoke with [facility] CSW who reports confirmation that patient has been cleared for discharge home tomorrow, Saturday, 06/17/19. Navigator updated his outpatient RN/Cheryl with Theress[facility] Stamps and will fax medical records.   Alphonzo Cruise, [place] Renal Navigator [phone]
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
## 2019-06-16 NOTE — Plan of Care (Signed)
  Problem: Sit to Stand Goal: LTG:  Patient will perform sit to stand in prep for activites of daily living with assistance level (OT) Description: LTG:  Patient will perform sit to stand in prep for activites of daily living with assistance level (OT) Outcome: Completed/Met   Problem: RH Bathing Goal: LTG Patient will bathe all body parts with assist levels (OT) Description: LTG: Patient will bathe all body parts with assist levels (OT) Outcome: Completed/Met   Problem: RH Dressing Goal: LTG Patient will perform upper body dressing (OT) Description: LTG Patient will perform upper body dressing with assist, with/without cues (OT). Outcome: Completed/Met Goal: LTG Patient will perform lower body dressing w/assist (OT) Description: LTG: Patient will perform lower body dressing with assist, with/without cues in positioning using equipment (OT) Outcome: Completed/Met   Problem: RH Toileting Goal: LTG Patient will perform toileting task (3/3 steps) with assistance level (OT) Description: LTG: Patient will perform toileting task (3/3 steps) with assistance level (OT)  Outcome: Completed/Met   Problem: RH Toilet Transfers Goal: LTG Patient will perform toilet transfers w/assist (OT) Description: LTG: Patient will perform toilet transfers with assist, with/without cues using equipment (OT) Outcome: Completed/Met

## 2019-06-16 NOTE — Progress Notes (Signed)
Physical Therapy Session Note  Patient Details  Name: Max Pittman. MRN: [id number] Date of Birth: [birth year]  Today's Date: 06/16/2019 PT Individual Time: 1302-1329 PT Individual Time Calculation (min): 27 min   Short Term Goals: Week 2:  PT Short Term Goal 1 (Week 2): STG=LTG due to ELOS.  Skilled Therapeutic Interventions/Progress Updates: Pt presents sitting at EOB finishing lunch.  Pt agreeable to therapy.  Pt performed multiple sup < sit transfers w/ min A using leg lifter, but bed flat and w/o siderails and supervision and supervision for sup > sit for same configuration as will have regular bed at home.  Pt performed multiple sit to stand w/ supervision and RW and then amb 4' in room w/ close supervision and verbal cues for safe turns.  Discussed need to clear right LE from floor to avoid falling, as well as twisting ankle/knee, leaving him w/o a strong leg.  Pt returned to bed and left sitting EOB w/ all needs in place.     Therapy Documentation Precautions:  Precautions Precautions: (P) Fall Precaution Booklet Issued: (P) No Precaution Comments: (P) Reviewed posterior hip precautions Restrictions Weight Bearing Restrictions: (P) Yes LLE Weight Bearing: (P) Touchdown weight bearing General:   Vital Signs: Therapy Vitals Temp: 97.9 F (36.6 C) Pulse Rate: (!) 101 Resp: 17 BP: 99/70 Patient Position (if appropriate): Lying Oxygen Therapy SpO2: 95 % O2 Device: Room Air Pain:  8/10 left knee/leg Mobility: Bed Mobility Bed Mobility: Sit to Supine;Supine to Sit Supine to Sit: Supervision/Verbal cueing Sit to Supine: Minimal Assistance - Patient > 75% Transfers Transfers: Sit to Stand;Stand to Sit Sit to Stand: Contact Guard/Touching assist Stand to Sit: Contact Guard/Touching assist Stand Pivot Transfers: (P) Minimal Assistance - Patient > 75% Stand Pivot Transfer Details: (P) Verbal cues for technique;Verbal cues for sequencing;Verbal cues for gait  pattern;Verbal cues for precautions/safety;Verbal cues for safe use of DME/AE Transfer (Assistive device): (P) Rolling walker Locomotion :    Trunk/Postural Assessment : Cervical Assessment Cervical Assessment: (P) Exceptions to WFL(forward head) Thoracic Assessment Thoracic Assessment: (P) Exceptions to WFL(rounded shoulders) Lumbar Assessment Lumbar Assessment: (P) Exceptions to WFL(posterior pelvic tilt in relaxed sitting) Postural Control Postural Control: (P) Deficits on evaluation Postural Limitations: (P) decreased due to L LE TDWB and need for RW  Balance: Balance Balance Assessed: (P) Yes Static Sitting Balance Static Sitting - Balance Support: (P) Bilateral upper extremity supported;Feet unsupported Static Sitting - Level of Assistance: (P) 5: Stand by assistance Dynamic Sitting Balance Dynamic Sitting - Balance Support: (P) Feet supported Dynamic Sitting - Level of Assistance: (P) 4: Min assist Sitting balance - Comments: (P) reminders for hip precautions when sitting; assist to donn pants and socks Static Standing Balance Static Standing - Balance Support: (P) During functional activity;Bilateral upper extremity supported Static Standing - Level of Assistance: (P) 4: Min assist Dynamic Standing Balance Dynamic Standing - Balance Support: (P) Bilateral upper extremity supported;During functional activity Dynamic Standing - Level of Assistance: (P) 3: Mod assist Exercises:   Other Treatments:      Therapy/Group: Individual Therapy  Ladoris Gene 06/16/2019, 3:45 PM

## 2019-06-17 LAB — PROTIME-INR
INR: 2.2 — ABNORMAL HIGH (ref 0.8–1.2)
Prothrombin Time: 24 s — ABNORMAL HIGH (ref 11.4–15.2)

## 2019-06-17 LAB — GLUCOSE, CAPILLARY: Glucose-Capillary: 98 mg/dL (ref 70–99)

## 2019-06-17 NOTE — Progress Notes (Signed)
Patient discharged to home per wheelchair accompanied by NT and partner. Meds from pharmacy given and signed by patient. No further questions noted.
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
## 2019-06-19 ENCOUNTER — Telehealth (HOSPITAL_COMMUNITY): Payer: Self-pay

## 2019-06-19 ENCOUNTER — Other Ambulatory Visit (HOSPITAL_COMMUNITY): Payer: Self-pay | Admitting: Nephrology

## 2019-06-19 DIAGNOSIS — N186 End stage renal disease: Secondary | ICD-10-CM

## 2019-06-19 NOTE — Telephone Encounter (Signed)
Called to schedule tunneled cath removal, no answer, left vm. AW

## 2019-06-19 NOTE — Progress Notes (Signed)
Inpatient Rehabilitation Care Coordinator  Discharge Note  The overall goal for the admission was met for:   Discharge location: Yes. D/c to home with his s/o [name].  Length of Stay: Yes. 15 days.  Discharge activity level: Yes. Supervision.  Home/community participation: Yes. Limited.  Services provided included: MD, RD, PT, OT, SLP, RN, CM, TR, Pharmacy, Neuropsych and SW  Financial Services: Medicare and Private Insurance: [place]  Follow-up services arranged: Home Health: Unable to obtain due to [facility] and DME: [employer] for w/c, RW, and DABSC (item to be delivered to the home).  Comments (or additional information): contact pt [phone] or s/o [name] # [phone]  Patient/Family verbalized understanding of follow-up arrangements: Yes  Individual responsible for coordination of the follow-up plan: Pt to have assistance from s/o to coordinate care needs.   Confirmed correct DME delivered: Rana Snare 06/19/2019    Rana Snare

## 2019-06-20 ENCOUNTER — Telehealth: Payer: Self-pay | Admitting: Pharmacist

## 2019-06-20 NOTE — Telephone Encounter (Signed)
-----   Message from Donnamae Jude, [facility] sent at 06/16/2019 12:16 PM EDT ----- Regarding: New Warfarin pt Max Pittman,  I wanted to let you know about this patient who was newly started on warfarin this admission. Originally it was for DVT ppx post ORIF for 8 weeks but he had a new LLE DVT 5/9 so I'm suggesting 3 months from that day. He has a complex hospital course and many bleed risks - see inpatient warfarin note for review.   I asked CIR to send a referral to you but haven't seen it yet and wanted to make sure he didn't get lost to follow up. I'll talk to him too to make sure he follows up.  Thanks! Alma Friendly

## 2019-06-20 NOTE — Telephone Encounter (Signed)
Called pt and left message.  Will need to schedule new Coumadin appt in [place] office (pt lives in [place]).

## 2019-06-21 NOTE — Progress Notes (Signed)
  Radiation Oncology         [phone]) [phone] ________________________________  Name: Max Pittman. MRN: [id number]  Date: 05/24/2019  DOB: Jan 06, 1976  End of Treatment Note  Diagnosis:   Postoperative heterotopic ossification     Indication for treatment:  curative       Radiation treatment dates:   05/24/19  Site/dose:   The patient was treated to a dose of 7 Gy to the left hip. This was accomplished with AP and PA fields.  Narrative: The patient tolerated radiation treatment relatively well.   No difficulties.  Plan: The patient has completed radiation treatment. The patient will return to radiation oncology clinic on an as needed basis. ________________________________  Jodelle Gross, M.D., Ph.D.

## 2019-06-21 NOTE — Telephone Encounter (Signed)
2nd message left.

## 2019-06-22 NOTE — Telephone Encounter (Signed)
Called pt again, states he doesn't have a car right now and won't be able to make it to an appt for the next week or so. Scheduled pt for new Coumadin appt in [place] next Thursday.

## 2019-06-27 ENCOUNTER — Telehealth: Payer: Self-pay

## 2019-06-27 NOTE — Telephone Encounter (Signed)
Larene Beach, RN form [facility] called wanting to know who to report INR to. Called Shannon back and told her [facility] and gave number.

## 2019-06-28 ENCOUNTER — Ambulatory Visit (INDEPENDENT_AMBULATORY_CARE_PROVIDER_SITE_OTHER): Payer: Medicare Other | Admitting: *Deleted

## 2019-06-28 DIAGNOSIS — I824Y9 Acute embolism and thrombosis of unspecified deep veins of unspecified proximal lower extremity: Secondary | ICD-10-CM

## 2019-06-28 DIAGNOSIS — I6349 Cerebral infarction due to embolism of other cerebral artery: Secondary | ICD-10-CM | POA: Diagnosis not present

## 2019-06-28 DIAGNOSIS — Z5181 Encounter for therapeutic drug level monitoring: Secondary | ICD-10-CM

## 2019-06-28 LAB — POCT INR: INR: 1.7 — AB (ref 2.0–3.0)

## 2019-06-28 NOTE — Patient Instructions (Signed)
Last INR was checked 5/28 by [facility] and called to Nephrology.  INR was 2.1 on warfarin 2mg  daily. INR today 1.7.  Told to increase warfarin to 1 tablet daily except 1 1/2 tablets on Wednesdays and Saturdays and recheck on Monday 07/03/19.  Order given to [facility]

## 2019-06-29 ENCOUNTER — Other Ambulatory Visit: Payer: Self-pay

## 2019-06-29 ENCOUNTER — Encounter: Payer: Self-pay | Admitting: Registered Nurse

## 2019-06-29 ENCOUNTER — Encounter: Payer: Medicare Other | Attending: Registered Nurse | Admitting: Registered Nurse

## 2019-06-29 VITALS — BP 160/96 | HR 113 | Temp 97.5°F | Ht 69.0 in | Wt 300.0 lb

## 2019-06-29 DIAGNOSIS — I639 Cerebral infarction, unspecified: Secondary | ICD-10-CM

## 2019-06-29 DIAGNOSIS — I201 Angina pectoris with documented spasm: Secondary | ICD-10-CM | POA: Diagnosis not present

## 2019-06-29 DIAGNOSIS — T07XXXA Unspecified multiple injuries, initial encounter: Secondary | ICD-10-CM | POA: Diagnosis not present

## 2019-06-29 DIAGNOSIS — S2243XA Multiple fractures of ribs, bilateral, initial encounter for closed fracture: Secondary | ICD-10-CM

## 2019-06-29 DIAGNOSIS — S32462S Displaced associated transverse-posterior fracture of left acetabulum, sequela: Secondary | ICD-10-CM

## 2019-06-29 DIAGNOSIS — J9690 Respiratory failure, unspecified, unspecified whether with hypoxia or hypercapnia: Secondary | ICD-10-CM | POA: Diagnosis not present

## 2019-06-29 DIAGNOSIS — N186 End stage renal disease: Secondary | ICD-10-CM

## 2019-06-29 DIAGNOSIS — I120 Hypertensive chronic kidney disease with stage 5 chronic kidney disease or end stage renal disease: Secondary | ICD-10-CM | POA: Diagnosis not present
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
## 2019-06-29 NOTE — Patient Instructions (Signed)
1. Call Your Primary Care Doctor to schedule Hospital Follow Up Appointment and to Discussed obtaining a Neurologist in [place] as you requested.   If you don't obtain a Neurologist, you need to schedule appointment with [facility], see number below   Call [facility] : [facility] Follow Up Appointment  Please Call Dr Marcelino Scot : [phone], to schedule Hospital Follow Up appointment.

## 2019-06-30 ENCOUNTER — Encounter (HOSPITAL_COMMUNITY): Payer: Self-pay | Admitting: Emergency Medicine

## 2019-06-30 ENCOUNTER — Inpatient Hospital Stay (HOSPITAL_COMMUNITY)
Admission: EM | Admit: 2019-06-30 | Discharge: 2019-07-06 | DRG: 682 | Disposition: A | Discharge status: Home Health | Payer: Medicare Other | Attending: Internal Medicine | Admitting: Internal Medicine

## 2019-06-30 ENCOUNTER — Other Ambulatory Visit: Payer: Self-pay

## 2019-06-30 ENCOUNTER — Emergency Department (HOSPITAL_COMMUNITY): Payer: Medicare Other

## 2019-06-30 DIAGNOSIS — Z992 Dependence on renal dialysis: Secondary | ICD-10-CM

## 2019-06-30 DIAGNOSIS — J9690 Respiratory failure, unspecified, unspecified whether with hypoxia or hypercapnia: Secondary | ICD-10-CM | POA: Diagnosis present

## 2019-06-30 DIAGNOSIS — E1165 Type 2 diabetes mellitus with hyperglycemia: Secondary | ICD-10-CM | POA: Diagnosis present

## 2019-06-30 DIAGNOSIS — Z87891 Personal history of nicotine dependence: Secondary | ICD-10-CM

## 2019-06-30 DIAGNOSIS — Z885 Allergy status to narcotic agent status: Secondary | ICD-10-CM

## 2019-06-30 DIAGNOSIS — N186 End stage renal disease: Secondary | ICD-10-CM | POA: Diagnosis present

## 2019-06-30 DIAGNOSIS — J810 Acute pulmonary edema: Secondary | ICD-10-CM | POA: Diagnosis present

## 2019-06-30 DIAGNOSIS — Z6841 Body Mass Index (BMI) 40.0 and over, adult: Secondary | ICD-10-CM

## 2019-06-30 DIAGNOSIS — Z7982 Long term (current) use of aspirin: Secondary | ICD-10-CM

## 2019-06-30 DIAGNOSIS — Z20822 Contact with and (suspected) exposure to covid-19: Secondary | ICD-10-CM | POA: Diagnosis present

## 2019-06-30 DIAGNOSIS — I161 Hypertensive emergency: Secondary | ICD-10-CM | POA: Diagnosis present

## 2019-06-30 DIAGNOSIS — J811 Chronic pulmonary edema: Secondary | ICD-10-CM | POA: Diagnosis present

## 2019-06-30 DIAGNOSIS — Z91040 Latex allergy status: Secondary | ICD-10-CM

## 2019-06-30 DIAGNOSIS — R55 Syncope and collapse: Secondary | ICD-10-CM | POA: Diagnosis present

## 2019-06-30 DIAGNOSIS — Z7901 Long term (current) use of anticoagulants: Secondary | ICD-10-CM

## 2019-06-30 DIAGNOSIS — I120 Hypertensive chronic kidney disease with stage 5 chronic kidney disease or end stage renal disease: Principal | ICD-10-CM | POA: Diagnosis present

## 2019-06-30 DIAGNOSIS — Z833 Family history of diabetes mellitus: Secondary | ICD-10-CM

## 2019-06-30 DIAGNOSIS — E1122 Type 2 diabetes mellitus with diabetic chronic kidney disease: Secondary | ICD-10-CM | POA: Diagnosis present

## 2019-06-30 DIAGNOSIS — Z83438 Family history of other disorder of lipoprotein metabolism and other lipidemia: Secondary | ICD-10-CM

## 2019-06-30 DIAGNOSIS — Z8249 Family history of ischemic heart disease and other diseases of the circulatory system: Secondary | ICD-10-CM

## 2019-06-30 DIAGNOSIS — Z881 Allergy status to other antibiotic agents status: Secondary | ICD-10-CM

## 2019-06-30 DIAGNOSIS — D72829 Elevated white blood cell count, unspecified: Secondary | ICD-10-CM | POA: Diagnosis present

## 2019-06-30 DIAGNOSIS — Z79899 Other long term (current) drug therapy: Secondary | ICD-10-CM

## 2019-06-30 DIAGNOSIS — D631 Anemia in chronic kidney disease: Secondary | ICD-10-CM | POA: Diagnosis present

## 2019-06-30 DIAGNOSIS — J9601 Acute respiratory failure with hypoxia: Secondary | ICD-10-CM | POA: Diagnosis present

## 2019-06-30 DIAGNOSIS — E8770 Fluid overload, unspecified: Secondary | ICD-10-CM | POA: Diagnosis present

## 2019-06-30 DIAGNOSIS — I482 Chronic atrial fibrillation, unspecified: Secondary | ICD-10-CM | POA: Diagnosis present

## 2019-06-30 DIAGNOSIS — Z86718 Personal history of other venous thrombosis and embolism: Secondary | ICD-10-CM

## 2019-06-30 DIAGNOSIS — E876 Hypokalemia: Secondary | ICD-10-CM | POA: Diagnosis present

## 2019-06-30 LAB — COMPREHENSIVE METABOLIC PANEL
ALT: 14 U/L (ref 0–44)
AST: 18 U/L (ref 15–41)
Albumin: 2.4 g/dL — ABNORMAL LOW (ref 3.5–5.0)
Alkaline Phosphatase: 100 U/L (ref 38–126)
Anion gap: 15 (ref 5–15)
BUN: 29 mg/dL — ABNORMAL HIGH (ref 6–20)
CO2: 25 mmol/L (ref 22–32)
Calcium: 8.9 mg/dL (ref 8.9–10.3)
Chloride: 98 mmol/L (ref 98–111)
Creatinine, Ser: 8.37 mg/dL — ABNORMAL HIGH (ref 0.61–1.24)
GFR calc Af Amer: 8 mL/min — ABNORMAL LOW (ref >60)
GFR calc non Af Amer: 7 mL/min — ABNORMAL LOW (ref >60)
Glucose, Bld: 241 mg/dL — ABNORMAL HIGH (ref 70–99)
Potassium: 2.9 mmol/L — ABNORMAL LOW (ref 3.5–5.1)
Sodium: 138 mmol/L (ref 135–145)
Total Bilirubin: 1.3 mg/dL — ABNORMAL HIGH (ref 0.3–1.2)
Total Protein: 7.2 g/dL (ref 6.5–8.1)

## 2019-06-30 LAB — CBC WITH DIFFERENTIAL/PLATELET
Abs Immature Granulocytes: 0.12 10*3/uL — ABNORMAL HIGH (ref 0.00–0.07)
Basophils Absolute: 0.1 10*3/uL (ref 0.0–0.1)
Basophils Relative: 1 %
Eosinophils Absolute: 0.4 10*3/uL (ref 0.0–0.5)
Eosinophils Relative: 2 %
HCT: 31.6 % — ABNORMAL LOW (ref 39.0–52.0)
Hemoglobin: 9.2 g/dL — ABNORMAL LOW (ref 13.0–17.0)
Immature Granulocytes: 1 %
Lymphocytes Relative: 43 %
Lymphs Abs: 7.7 10*3/uL — ABNORMAL HIGH (ref 0.7–4.0)
MCH: 28.8 pg (ref 26.0–34.0)
MCHC: 29.1 g/dL — ABNORMAL LOW (ref 30.0–36.0)
MCV: 99.1 fL (ref 80.0–100.0)
Monocytes Absolute: 1.6 10*3/uL — ABNORMAL HIGH (ref 0.1–1.0)
Monocytes Relative: 9 %
Neutro Abs: 7.8 10*3/uL — ABNORMAL HIGH (ref 1.7–7.7)
Neutrophils Relative %: 44 %
Platelets: 401 10*3/uL — ABNORMAL HIGH (ref 150–400)
RBC: 3.19 MIL/uL — ABNORMAL LOW (ref 4.22–5.81)
RDW: 15.6 % — ABNORMAL HIGH (ref 11.5–15.5)
WBC: 17.5 10*3/uL — ABNORMAL HIGH (ref 4.0–10.5)
nRBC: 0 % (ref 0.0–0.2)

## 2019-06-30 LAB — BRAIN NATRIURETIC PEPTIDE: B Natriuretic Peptide: 866 pg/mL — ABNORMAL HIGH (ref 0.0–100.0)

## 2019-06-30 LAB — TROPONIN I (HIGH SENSITIVITY): Troponin I (High Sensitivity): 69 ng/L — ABNORMAL HIGH (ref <18)

## 2019-06-30 IMAGING — DX DG CHEST 1V PORT
1 series · 1 of 1 positions shown · non-contrast
Comparison: [DATE]

CLINICAL DATA: Shortness of breath, desaturation during dialysis

EXAM:
PORTABLE CHEST 1 VIEW

[chest ap grid]
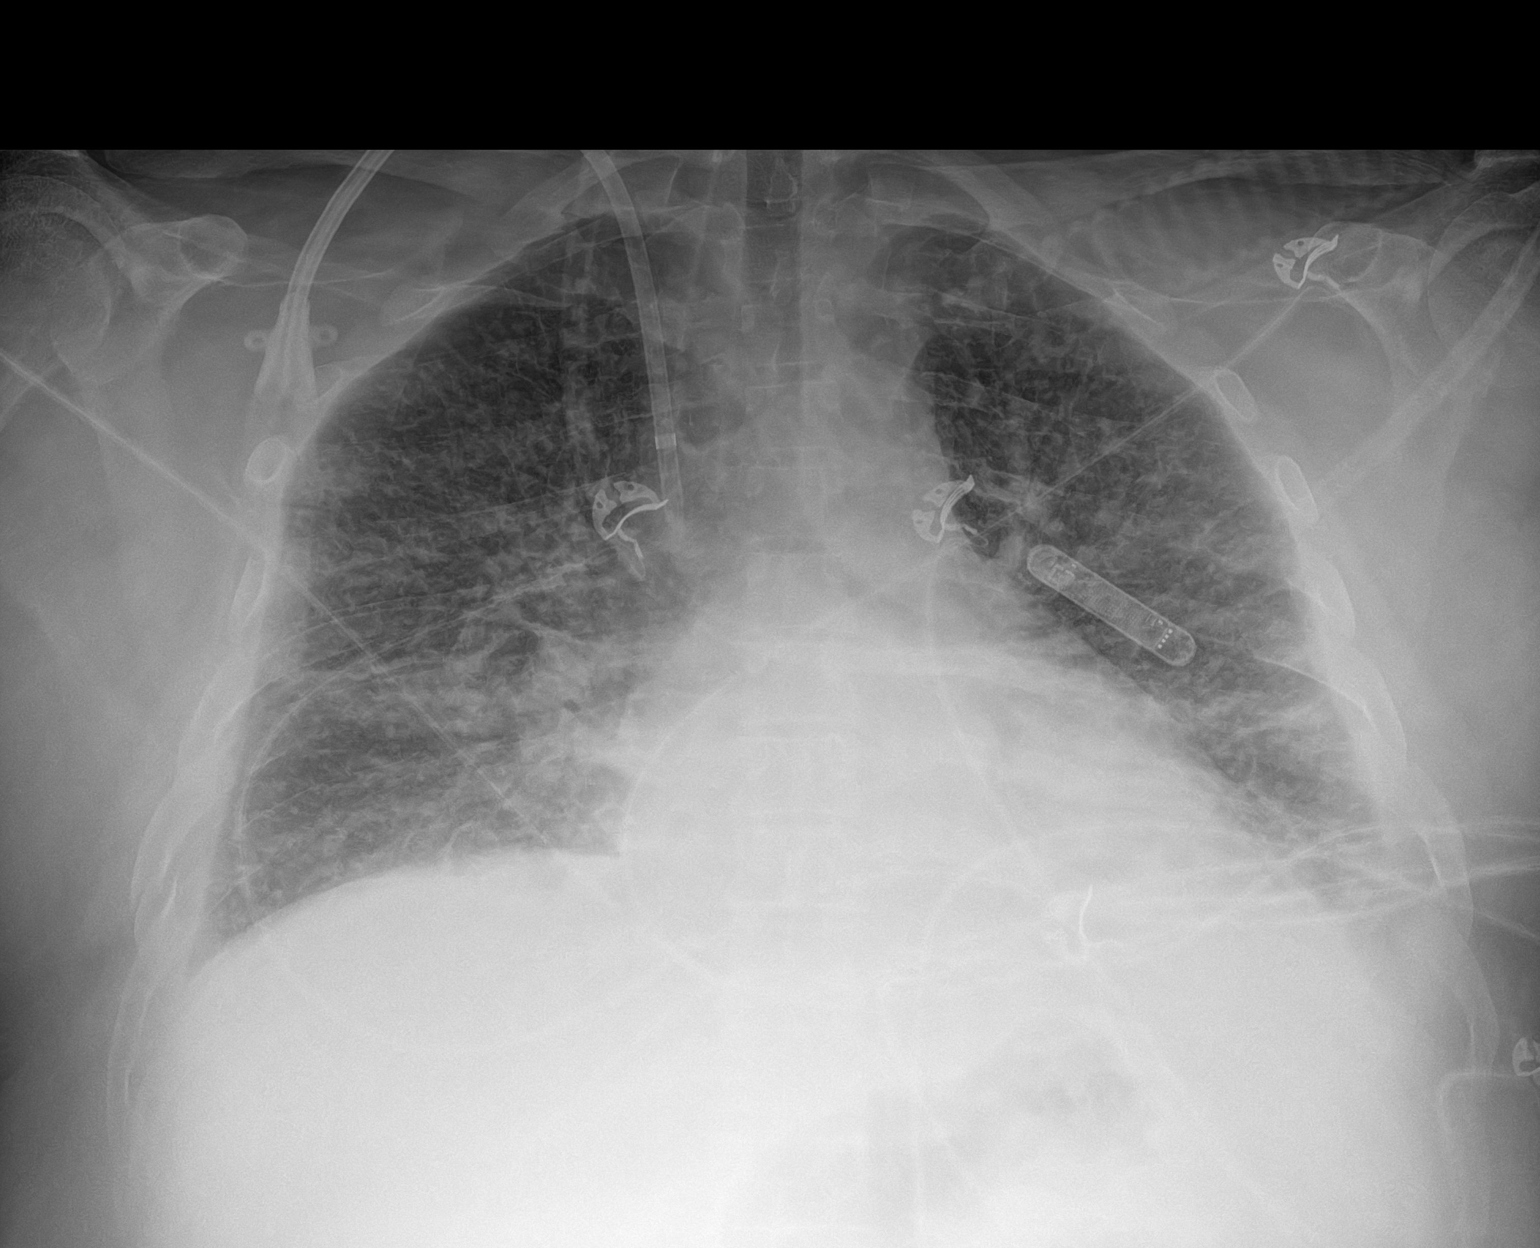

[1 of 1 positions shown; findings below may reference images not displayed]

FINDINGS: Single frontal view of the chest demonstrates right internal jugular
dialysis catheter tip overlying superior vena cava. Loop recorder
overlies left chest. Cardiac silhouette is mildly enlarged. There is
vascular congestion, with diffuse interstitial prominence and patchy
bilateral perihilar airspace disease left greater than right. There
are small bilateral pleural effusions. No pneumothorax.
IMPRESSION: 1. Findings most consistent with pulmonary edema.

## 2019-06-30 MED ORDER — NITROGLYCERIN IN D5W 200-5 MCG/ML-% IV SOLN
5.0000 ug/min | INTRAVENOUS | Status: DC
  Administered 2019-06-30: 5 ug/min via INTRAVENOUS
  Administered 2019-07-01 (×2): 45 ug/min via INTRAVENOUS
  Filled 2019-06-30: qty 250
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
## 2019-06-30 NOTE — ED Triage Notes (Signed)
Dialysis pt was at home this evening when he became very sob.  Per ems, pt sats in 70's.

## 2019-07-01 DIAGNOSIS — Z79899 Other long term (current) drug therapy: Secondary | ICD-10-CM | POA: Diagnosis not present

## 2019-07-01 DIAGNOSIS — R55 Syncope and collapse: Secondary | ICD-10-CM | POA: Diagnosis present

## 2019-07-01 DIAGNOSIS — Z87891 Personal history of nicotine dependence: Secondary | ICD-10-CM | POA: Diagnosis not present

## 2019-07-01 DIAGNOSIS — Z20822 Contact with and (suspected) exposure to covid-19: Secondary | ICD-10-CM | POA: Diagnosis present

## 2019-07-01 DIAGNOSIS — J811 Chronic pulmonary edema: Secondary | ICD-10-CM | POA: Diagnosis present

## 2019-07-01 DIAGNOSIS — E1122 Type 2 diabetes mellitus with diabetic chronic kidney disease: Secondary | ICD-10-CM | POA: Diagnosis present

## 2019-07-01 DIAGNOSIS — I161 Hypertensive emergency: Secondary | ICD-10-CM | POA: Diagnosis present

## 2019-07-01 DIAGNOSIS — J9690 Respiratory failure, unspecified, unspecified whether with hypoxia or hypercapnia: Secondary | ICD-10-CM | POA: Diagnosis present

## 2019-07-01 DIAGNOSIS — I482 Chronic atrial fibrillation, unspecified: Secondary | ICD-10-CM | POA: Diagnosis present

## 2019-07-01 DIAGNOSIS — J9601 Acute respiratory failure with hypoxia: Secondary | ICD-10-CM | POA: Diagnosis present

## 2019-07-01 DIAGNOSIS — E1165 Type 2 diabetes mellitus with hyperglycemia: Secondary | ICD-10-CM | POA: Diagnosis present

## 2019-07-01 DIAGNOSIS — J810 Acute pulmonary edema: Secondary | ICD-10-CM | POA: Diagnosis present

## 2019-07-01 DIAGNOSIS — Z91040 Latex allergy status: Secondary | ICD-10-CM | POA: Diagnosis not present

## 2019-07-01 DIAGNOSIS — Z885 Allergy status to narcotic agent status: Secondary | ICD-10-CM | POA: Diagnosis not present

## 2019-07-01 DIAGNOSIS — N186 End stage renal disease: Secondary | ICD-10-CM | POA: Diagnosis present

## 2019-07-01 DIAGNOSIS — I120 Hypertensive chronic kidney disease with stage 5 chronic kidney disease or end stage renal disease: Secondary | ICD-10-CM | POA: Diagnosis present

## 2019-07-01 DIAGNOSIS — Z86718 Personal history of other venous thrombosis and embolism: Secondary | ICD-10-CM | POA: Diagnosis not present

## 2019-07-01 DIAGNOSIS — D72829 Elevated white blood cell count, unspecified: Secondary | ICD-10-CM | POA: Diagnosis present

## 2019-07-01 DIAGNOSIS — D631 Anemia in chronic kidney disease: Secondary | ICD-10-CM | POA: Diagnosis present

## 2019-07-01 DIAGNOSIS — Z881 Allergy status to other antibiotic agents status: Secondary | ICD-10-CM | POA: Diagnosis not present

## 2019-07-01 DIAGNOSIS — Z7901 Long term (current) use of anticoagulants: Secondary | ICD-10-CM | POA: Diagnosis not present

## 2019-07-01 DIAGNOSIS — E876 Hypokalemia: Secondary | ICD-10-CM | POA: Diagnosis present

## 2019-07-01 DIAGNOSIS — Z6841 Body Mass Index (BMI) 40.0 and over, adult: Secondary | ICD-10-CM | POA: Diagnosis not present

## 2019-07-01 DIAGNOSIS — E8770 Fluid overload, unspecified: Secondary | ICD-10-CM | POA: Diagnosis present

## 2019-07-01 DIAGNOSIS — Z992 Dependence on renal dialysis: Secondary | ICD-10-CM | POA: Diagnosis not present

## 2019-07-01 LAB — RENAL FUNCTION PANEL
Albumin: 2 g/dL — ABNORMAL LOW (ref 3.5–5.0)
Anion gap: 16 — ABNORMAL HIGH (ref 5–15)
BUN: 28 mg/dL — ABNORMAL HIGH (ref 6–20)
CO2: 25 mmol/L (ref 22–32)
Calcium: 8.5 mg/dL — ABNORMAL LOW (ref 8.9–10.3)
Chloride: 98 mmol/L (ref 98–111)
Creatinine, Ser: 8.64 mg/dL — ABNORMAL HIGH (ref 0.61–1.24)
GFR calc Af Amer: 8 mL/min — ABNORMAL LOW (ref >60)
GFR calc non Af Amer: 7 mL/min — ABNORMAL LOW (ref >60)
Glucose, Bld: 122 mg/dL — ABNORMAL HIGH (ref 70–99)
Phosphorus: 3.7 mg/dL (ref 2.5–4.6)
Potassium: 3 mmol/L — ABNORMAL LOW (ref 3.5–5.1)
Sodium: 139 mmol/L (ref 135–145)

## 2019-07-01 LAB — BLOOD GAS, ARTERIAL
Acid-Base Excess: 3.9 mmol/L — ABNORMAL HIGH (ref 0.0–2.0)
Bicarbonate: 27.8 mmol/L (ref 20.0–28.0)
FIO2: 40
O2 Saturation: 96.6 %
Patient temperature: 37
pCO2 arterial: 42.6 mmHg (ref 32.0–48.0)
pH, Arterial: 7.433 (ref 7.350–7.450)
pO2, Arterial: 90.1 mmHg (ref 83.0–108.0)

## 2019-07-01 LAB — CBC
HCT: 25.5 % — ABNORMAL LOW (ref 39.0–52.0)
HCT: 25.5 % — ABNORMAL LOW (ref 39.0–52.0)
Hemoglobin: 7.5 g/dL — ABNORMAL LOW (ref 13.0–17.0)
Hemoglobin: 7.7 g/dL — ABNORMAL LOW (ref 13.0–17.0)
MCH: 28.6 pg (ref 26.0–34.0)
MCH: 28.9 pg (ref 26.0–34.0)
MCHC: 29.4 g/dL — ABNORMAL LOW (ref 30.0–36.0)
MCHC: 30.2 g/dL (ref 30.0–36.0)
MCV: 97.3 fL (ref 80.0–100.0)
MCV: 95.9 fL (ref 80.0–100.0)
Platelets: 291 10*3/uL (ref 150–400)
Platelets: 289 10*3/uL (ref 150–400)
RBC: 2.62 MIL/uL — ABNORMAL LOW (ref 4.22–5.81)
RBC: 2.66 MIL/uL — ABNORMAL LOW (ref 4.22–5.81)
RDW: 15.3 % (ref 11.5–15.5)
RDW: 15.4 % (ref 11.5–15.5)
WBC: 9.5 10*3/uL (ref 4.0–10.5)
WBC: 13.3 10*3/uL — ABNORMAL HIGH (ref 4.0–10.5)
nRBC: 0 % (ref 0.0–0.2)
nRBC: 0 % (ref 0.0–0.2)

## 2019-07-01 LAB — PROTIME-INR
INR: 1.9 — ABNORMAL HIGH (ref 0.8–1.2)
INR: 2.2 — ABNORMAL HIGH (ref 0.8–1.2)
Prothrombin Time: 20.8 seconds — ABNORMAL HIGH (ref 11.4–15.2)
Prothrombin Time: 23.5 seconds — ABNORMAL HIGH (ref 11.4–15.2)

## 2019-07-01 LAB — BASIC METABOLIC PANEL
Anion gap: 12 (ref 5–15)
BUN: 31 mg/dL — ABNORMAL HIGH (ref 6–20)
CO2: 28 mmol/L (ref 22–32)
Calcium: 8.6 mg/dL — ABNORMAL LOW (ref 8.9–10.3)
Chloride: 99 mmol/L (ref 98–111)
Creatinine, Ser: 8.29 mg/dL — ABNORMAL HIGH (ref 0.61–1.24)
GFR calc Af Amer: 8 mL/min — ABNORMAL LOW (ref >60)
GFR calc non Af Amer: 7 mL/min — ABNORMAL LOW (ref >60)
Glucose, Bld: 119 mg/dL — ABNORMAL HIGH (ref 70–99)
Potassium: 2.9 mmol/L — ABNORMAL LOW (ref 3.5–5.1)
Sodium: 139 mmol/L (ref 135–145)

## 2019-07-01 LAB — CBG MONITORING, ED
Glucose-Capillary: 89 mg/dL (ref 70–99)
Glucose-Capillary: 107 mg/dL — ABNORMAL HIGH (ref 70–99)

## 2019-07-01 LAB — TROPONIN I (HIGH SENSITIVITY): Troponin I (High Sensitivity): 83 ng/L — ABNORMAL HIGH (ref <18)

## 2019-07-01 LAB — SARS CORONAVIRUS 2 BY RT PCR (HOSPITAL ORDER, PERFORMED IN ~~LOC~~ HOSPITAL LAB): SARS Coronavirus 2: NEGATIVE

## 2019-07-01 LAB — GLUCOSE, CAPILLARY
Glucose-Capillary: 120 mg/dL — ABNORMAL HIGH (ref 70–99)
Glucose-Capillary: 93 mg/dL (ref 70–99)

## 2019-07-01 MED ORDER — WARFARIN SODIUM 2 MG PO TABS
2.0000 mg | ORAL_TABLET | Freq: Every day | ORAL | Status: DC
  Administered 2019-07-01: 2 mg via ORAL
  Filled 2019-07-01 (×3): qty 1

## 2019-07-01 MED ORDER — ALTEPLASE 2 MG IJ SOLR: 2.0000 mg | Freq: Once | INTRAMUSCULAR | Status: DC | PRN

## 2019-07-01 MED ORDER — WARFARIN - PHARMACIST DOSING INPATIENT: Freq: Every day | Status: DC

## 2019-07-01 MED ORDER — INSULIN ASPART 100 UNIT/ML ~~LOC~~ SOLN
0.0000 [IU] | Freq: Three times a day (TID) | SUBCUTANEOUS | Status: DC
  Administered 2019-07-04: 2 [IU] via SUBCUTANEOUS
  Administered 2019-07-05: 1 [IU] via SUBCUTANEOUS
  Administered 2019-07-06: 2 [IU] via SUBCUTANEOUS

## 2019-07-01 MED ORDER — LIDOCAINE HCL (PF) 1 % IJ SOLN: 5.0000 mL | INTRAMUSCULAR | Status: DC | PRN

## 2019-07-01 MED ORDER — HYDROCODONE-ACETAMINOPHEN 5-325 MG PO TABS
1.0000 | ORAL_TABLET | Freq: Once | ORAL | Status: AC
  Administered 2019-07-01: 1 via ORAL
  Filled 2019-07-01: qty 1

## 2019-07-01 MED ORDER — ACETAMINOPHEN 325 MG PO TABS: 650.0000 mg | ORAL_TABLET | Freq: Four times a day (QID) | ORAL | Status: DC | PRN

## 2019-07-01 MED ORDER — OXYCODONE-ACETAMINOPHEN 5-325 MG PO TABS
1.0000 | ORAL_TABLET | ORAL | Status: DC | PRN
  Administered 2019-07-01 – 2019-07-05 (×11): 2 via ORAL
  Filled 2019-07-01 (×11): qty 2

## 2019-07-01 MED ORDER — METOPROLOL TARTRATE 25 MG PO TABS
25.0000 mg | ORAL_TABLET | Freq: Two times a day (BID) | ORAL | Status: DC
  Administered 2019-07-01 – 2019-07-02 (×3): 25 mg via ORAL
  Filled 2019-07-01 (×3): qty 1

## 2019-07-01 MED ORDER — ACETAMINOPHEN 650 MG RE SUPP: 650.0000 mg | Freq: Four times a day (QID) | RECTAL | Status: DC | PRN

## 2019-07-01 MED ORDER — PENTAFLUOROPROP-TETRAFLUOROETH EX AERO: 1.0000 "application " | INHALATION_SPRAY | CUTANEOUS | Status: DC | PRN

## 2019-07-01 MED ORDER — LABETALOL HCL 5 MG/ML IV SOLN: 10.0000 mg | INTRAVENOUS | Status: DC | PRN

## 2019-07-01 MED ORDER — CHLORHEXIDINE GLUCONATE CLOTH 2 % EX PADS
6.0000 | MEDICATED_PAD | Freq: Every day | CUTANEOUS | Status: DC
  Administered 2019-07-03 – 2019-07-06 (×3): 6 via TOPICAL

## 2019-07-01 MED ORDER — INSULIN ASPART 100 UNIT/ML ~~LOC~~ SOLN: 0.0000 [IU] | Freq: Every day | SUBCUTANEOUS | Status: DC

## 2019-07-01 MED ORDER — HEPARIN SODIUM (PORCINE) 1000 UNIT/ML DIALYSIS
1000.0000 [IU] | INTRAMUSCULAR | Status: DC | PRN
  Filled 2019-07-01: qty 1

## 2019-07-01 MED ORDER — SODIUM CHLORIDE 0.9 % IV SOLN: 100.0000 mL | INTRAVENOUS | Status: DC | PRN

## 2019-07-01 MED ORDER — ONDANSETRON HCL 4 MG/2ML IJ SOLN: 4.0000 mg | Freq: Four times a day (QID) | INTRAMUSCULAR | Status: DC | PRN

## 2019-07-01 MED ORDER — HEPARIN SODIUM (PORCINE) 1000 UNIT/ML DIALYSIS
20.0000 [IU]/kg | INTRAMUSCULAR | Status: DC | PRN
  Filled 2019-07-01: qty 3

## 2019-07-01 MED ORDER — LIDOCAINE-PRILOCAINE 2.5-2.5 % EX CREA
1.0000 "application " | TOPICAL_CREAM | CUTANEOUS | Status: DC | PRN
  Filled 2019-07-01: qty 5

## 2019-07-01 MED ORDER — FUROSEMIDE 10 MG/ML IJ SOLN
100.0000 mg | Freq: Once | INTRAVENOUS | Status: AC
  Administered 2019-07-01: 100 mg via INTRAVENOUS
  Filled 2019-07-01: qty 10

## 2019-07-01 MED ORDER — ONDANSETRON HCL 4 MG PO TABS: 4.0000 mg | ORAL_TABLET | Freq: Four times a day (QID) | ORAL | Status: DC | PRN

## 2019-07-01 MED ORDER — CHLORHEXIDINE GLUCONATE CLOTH 2 % EX PADS
6.0000 | MEDICATED_PAD | Freq: Every day | CUTANEOUS | Status: DC
  Administered 2019-07-01 – 2019-07-03 (×3): 6 via TOPICAL

## 2019-07-01 NOTE — Progress Notes (Addendum)
ANTICOAGULATION CONSULT NOTE - Initial Up Consult   Pharmacy Consult for warfarin dosing  Indication: atrial fibrillation   Allergies  Allergen Reactions  . Doxycycline Hives  . Latex Swelling    Pt reports swelling at site.   . Morphine And Related Anxiety      Patient Measurements: Last Weight  Most recent update: 06/30/2019 10:51 PM   Weight  136.1 kg (300 lb)           Body mass index is 44.3 kg/m. Max Pittman.               Temp: 97.8 F (36.6 C) (06/05 0219) BP: 137/99 (06/05 0940) Pulse Rate: 110 (06/05 0940)  Labs: Recent Labs    06/30/19 2240 07/01/19 0506  HGB 9.2* 7.5*  HCT 31.6* 25.5*  PLT 401* 291  LABPROT 20.8* 23.5*  INR 1.9* 2.2*  CREATININE 8.37* 8.29*    Estimated Creatinine Clearance: 15.7 mL/min (A) (by C-G formula based on SCr of 8.29 mg/dL (H)).     Medications:  (Not in a hospital admission)  Scheduled:  . Chlorhexidine Gluconate Cloth  6 each Topical Q0600  . insulin aspart  0-15 Units Subcutaneous TID WC  . insulin aspart  0-5 Units Subcutaneous QHS   Infusions:  . furosemide    . nitroGLYCERIN 55 mcg/min (07/01/19 0851)   PRN: acetaminophen **OR** acetaminophen, ondansetron **OR** ondansetron (ZOFRAN) IV Anti-infectives (From admission, onward)   None      Goal of Therapy:  INR 2-3 Monitor platelets by anticoagulation protocol: Yes    Prior to Admission Warfarin Dosing:  Max Pittman. takes 2mg  of warfarin daily or as directed by coumadin clinic     Admit INR was 1.9 on 6/4, 2.2 on 6/5 Lab Results  Component Value Date   INR 2.2 (H) 07/01/2019   INR 1.9 (H) 06/30/2019   INR 1.7 (A) 06/28/2019    Assessment: Max Pittman. a 44 y.o. male requires anticoagulation with warfarin for the indication of  DVT. Warfarin will be initiated inpatient following pharmacy protocol per pharmacy consult. Patient most recent blood work is as follows: CBC Latest Ref Rng & Units 07/01/2019 06/30/2019 06/15/2019   WBC 4.0 - 10.5 K/uL 9.5 17.5(H) 6.5  Hemoglobin 13.0 - 17.0 g/dL 7.5(L) 9.2(L) 8.2(L)  Hematocrit 39.0 - 52.0 % 25.5(L) 31.6(L) 27.6(L)  Platelets 150 - 400 K/uL 291 401(H) 296     Plan: Warfarin 2mg  po dailly Monitor CBC daily due to anemia with am labs   Monitor INR daily Monitor for signs and symptoms of bleeding   Donna Christen Laramie Meissner, PharmD, MBA, BCGP Clinical Pharmacist

## 2019-07-01 NOTE — Progress Notes (Signed)
Received MD verbal order to wean pt off from nitro drip.   Lavenia Atlas, RN

## 2019-07-01 NOTE — Procedures (Signed)
   I was present at this dialysis session, have reviewed the session itself and made  appropriate changes [name] MD [place] pager [phone]   07/01/2019, 8:45 PM
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
## 2019-07-01 NOTE — Progress Notes (Signed)
Warfarin per pharmacy consult ordered for hx of DVT  Patient took warfarin 6/4 prior to arrival in [facility] ED  Plan: Warfarin dosing to be addressed by day pharmacist on 6/5 Daily INR ordered  Barth Kirks, PharmD, BCPS, BCCCP Clinical Pharmacist [phone]  Please check AMION for all [place] numbers  07/01/2019 3:48 AM

## 2019-07-01 NOTE — Evaluation (Signed)
Patient transferred from [facility] for HD. Pt has been seen with bedside RN present. Currently stable, in process of weaning nitro gtt. Nephrology following and is planning HD later tonight. Currently O2 sats in the mid-90's on 4LNC although at the time of evaluation, tubing from [facility] had dislodged from wall, thus pt was essentially on RA. Denies chest pains. Does have continued subjective SOB but not in distress. Plan to f/u on HD later today
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
## 2019-07-01 NOTE — Progress Notes (Signed)
Pt arrived to rm 05 from [facility] ED via [facility]. Pt is on ntirogen drip 16.5 ml/hr and 4L nasal canula. Vss. Initiated tele. assessement and CHG bath given. Call bell within reach.   Lavenia Atlas, RN
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
## 2019-07-01 NOTE — H&P (Signed)
History and Physical    Max Pittman. ZOX:[id number] DOB: October 28, 1975 DOA: 06/30/2019  PCP: Chesley Noon, MD (Confirm with patient/family/NH records and if not entered, this has to be entered at [facility] point of entry) Patient coming from: Home  I have personally briefly reviewed patient's old medical records in [place]  Chief Complaint: Worsening shortness of breath  HPI: Zacchary Pompei. is a 44 y.o. male with medical history significant of hypertension, diabetes mellitus, ESRD on peritoneal dialysis and DVT on warfarin presented to ED for evaluation of respiratory distress.  Patient states that shortness of breath started suddenly while resting at home and continue to worsen.  Patient also reported increased swelling of bilateral lower extremities but denies fever, cough and chest pain.  Patient states that he is doing his peritoneal dialysis regularly but he is feeling that his body is retaining too much fluids that is why he is having abdominal distention and bilateral lower extremity edema.  Patient otherwise denies fever, chills, sore throat, dizziness chest pain,, nausea, vomiting, diarrhea, constipation and abdominal pain.  Patient states that he is still making some urine.  EMS found the patient really hypoxic with oxygen saturation in 70s and started him on oxygen supplementation with nasal cannula.  ED Course: On arrival to the ED patient had blood pressure of 153/111, heart rate 104, respiratory rate 19 and oxygen saturation 99 percent on  oxygen with nasal cannula while oxygen supplementation rate is not mentioned.  Blood work showed WBC 17.5, hemoglobin 9.2, sodium 138, potassium 2.9, BUN 29, creatinine 8.37, blood glucose 241 and BNP 866.  Covid test negative.  Chest x-ray was positive for acute pulmonary edema without any infiltrates or consolidation.  EKG showed sinus tachycardia with nonspecific T wave changes.  Patient was started on nitroglycerin drip for  hypertensive emergency and also placed on BiPAP by the ED physician and later on weaned off BiPAP and started on 6 L of oxygen with nasal cannula.  ED physician also spoke with on-call nephrologist/Dr. Carolin Sicks who felt that should be admitted to [facility] and he will be assessed by nephrology for dialysis.  Review of Systems: As per HPI otherwise 10 point review of systems negative.  Unacceptable ROS statements: "10 systems reviewed," "Extensive" (without elaboration).  Acceptable ROS statements: "All others negative," "All others reviewed and are negative," and "All others unremarkable," with at [place] documented Can't double dip - if using for HPI can't use for ROS  Past Medical History:  Diagnosis Date  . Arthritis   . Closed dislocation of left hip ([place]) 05/21/2019  . Diabetes ([place])   . Diabetes mellitus without complication ([place])   . History of peritoneal dialysis   . Hypertension   . Renal disorder    FFGS  . Renal disorder   . Stroke [facility])    when ha was a child  . Vasovagal syndrome    with syncope    Past Surgical History:  Procedure Laterality Date  . APPLICATION OF WOUND VAC Left 05/22/2019   Procedure: APPLICATION OF WOUND VAC  LEFT HIP;  Surgeon: Altamese [place], MD;  Location: [place];  Service: Orthopedics;  Laterality: Left;  . AV FISTULA INSERTION W/ RF MAGNETIC GUIDANCE Left   . AV FISTULA PLACEMENT    . BUBBLE STUDY  05/25/2019   Procedure: BUBBLE STUDY;  Surgeon: Dorothy Spark, MD;  Location: [place];  Service: Cardiovascular;;  . CHOLECYSTECTOMY    . HERNIA REPAIR    .  HIP CLOSED REDUCTION Left 05/20/2019   Procedure: CLOSED REDUCTION HIP WITH TRACTION PIN APPLICATION;  Surgeon: Altamese [place], MD;  Location: [place];  Service: Orthopedics;  Laterality: Left;  . IR FLUORO GUIDE CV LINE RIGHT  05/23/2019  . IR US GUIDE VASC ACCESS RIGHT  05/23/2019  . LEFT HEART CATH AND CORONARY ANGIOGRAPHY N/A 03/22/2019   Procedure: LEFT HEART CATH AND CORONARY  ANGIOGRAPHY;  Surgeon: Wellington [place], MD;  Location: [place] CV LAB;  Service: Cardiovascular;  Laterality: N/A;  . LOOP RECORDER INSERTION N/A 05/25/2019   Procedure: LOOP RECORDER INSERTION;  Surgeon: [name], MD;  Location: [place] CV LAB;  Service: Cardiovascular;  Laterality: N/A;  . ORIF ACETABULAR FRACTURE Left 05/22/2019   Procedure: OPEN REDUCTION INTERNAL FIXATION (ORIF) T TYPE  WITH ASSOCIATED POSTERIOR WALL ACETABULAR FRACTURE, LEFT; REMOVAL OF TRACTION PIN LEFT TIBIA;  Surgeon: Altamese [place], MD;  Location: [place];  Service: Orthopedics;  Laterality: Left;  . TEE WITHOUT CARDIOVERSION N/A 05/25/2019   Procedure: TRANSESOPHAGEAL ECHOCARDIOGRAM (TEE);  Surgeon: Dorothy Spark, MD;  Location: [facility] ENDOSCOPY;  Service: Cardiovascular;  Laterality: N/A;  . TONSILLECTOMY       reports that he quit smoking about 7 months ago. He has never used smokeless tobacco. He reports that he does not drink alcohol or use drugs.  Allergies  Allergen Reactions  . Doxycycline Hives  . Latex Swelling    Pt reports swelling at site.   . Doxycycline Hives  . Latex Swelling    Swelling at point of contact  . Morphine And Related Other (See Comments)    Causes anxiety  . Morphine And Related Anxiety    Family History  Problem Relation Age of Onset  . Heart disease Mother   . Diabetes Mother   . Hypertension Mother   . Diabetes Father   . Hypertension Father   . CAD Mother        "angina"  . Hypercholesterolemia Mother   . Hypercholesterolemia Father   . Healthy Brother     Unacceptable: Noncontributory, unremarkable, or negative. Acceptable: (example)Family history negative for heart disease  Prior to Admission medications   Medication Sig Start Date End Date Taking? Authorizing Provider  acetaminophen (TYLENOL) 325 MG tablet Take 2 tablets (650 mg total) by mouth every 8 (eight) hours. 06/16/19   Angiulli, Lavon Paganini, PA-C  aspirin EC 81 MG EC tablet Take 1 tablet (81  mg total) by mouth daily. 06/17/19   Angiulli, Lavon Paganini, PA-C  atorvastatin (LIPITOR) 40 MG tablet Take 1 tablet (40 mg total) by mouth daily at 6 PM. 06/16/19   Angiulli, Lavon Paganini, PA-C  buPROPion [facility] SR) 150 MG 12 hr tablet Take 1 tablet (150 mg total) by mouth 2 (two) times daily with a meal. 06/16/19   Angiulli, Lavon Paganini, PA-C  cholecalciferol (VITAMIN D) 25 MCG tablet Take 2 tablets (2,000 Units total) by mouth 2 (two) times daily. 06/16/19   Angiulli, Lavon Paganini, PA-C  docusate sodium (COLACE) 100 MG capsule As directed 05/16/19   [provider]  HYDROcodone-acetaminophen (NORCO/VICODIN) 5-325 MG tablet Take 1 tablet by mouth every 8 (eight) hours as needed. 06/20/19 07/20/19  Lorane Gell, NP  lidocaine (LIDODERM) 5 % Place 1 patch onto the skin daily. Remove & Discard patch within 12 hours or as directed by MD 06/16/19   Angiulli, Lavon Paganini, PA-C  loratadine (CLARITIN) 10 MG tablet Take 1 tablet (10 mg total) by mouth daily. 06/17/19   [place],  Lavon Paganini, PA-C  melatonin 5 MG TABS Take 1 tablet (5 mg total) by mouth at bedtime. 06/16/19   Angiulli, Lavon Paganini, PA-C  methocarbamol (ROBAXIN) 500 MG tablet Take 2 tablets (1,000 mg total) by mouth 3 (three) times daily. 06/16/19   Angiulli, Lavon Paganini, PA-C  omeprazole (PRILOSEC) 20 MG capsule Take 1 capsule (20 mg total) by mouth daily. 06/16/19   Angiulli, Lavon Paganini, PA-C  oxyCODONE (OXYCONTIN) 10 mg 12 hr tablet Take 1 tablet (10 mg total) by mouth daily. 06/17/19   Angiulli, Lavon Paganini, PA-C  pioglitazone (ACTOS) 15 MG tablet Take 1 tablet (15 mg total) by mouth daily. 06/17/19   Angiulli, Lavon Paganini, PA-C  polyethylene glycol (MIRALAX / GLYCOLAX) 17 g packet Take 17 g by mouth daily. 06/17/19   Angiulli, Lavon Paganini, PA-C  pregabalin (LYRICA) 50 MG capsule Take 1 capsule (50 mg total) by mouth at bedtime. 06/16/19   Angiulli, Lavon Paganini, PA-C  sevelamer carbonate (RENVELA) 800 MG tablet Take 3 tablets (2,400 mg total) by mouth 3 (three) times daily with  meals. 06/16/19   Angiulli, Lavon Paganini, PA-C  warfarin (COUMADIN) 2 MG tablet Take 1 tablet (2 mg total) by mouth daily. Or as directed by [facility] 06/16/19 10/14/19  Cathlyn Parsons, PA-C    Physical Exam: Vitals:   07/01/19 0300 07/01/19 0305 07/01/19 0310 07/01/19 0315  BP: (!) 139/99 (!) 135/97 (!) 154/110 (!) 160/112  Pulse: (!) 105 (!) 103 (!) 104 (!) 101  Resp: 15 17 (!) 22 17  Temp:      SpO2: 96% 96% 100% 97%  Weight:      Height:        Constitutional: NAD, calm, comfortable Vitals:   07/01/19 0300 07/01/19 0305 07/01/19 0310 07/01/19 0315  BP: (!) 139/99 (!) 135/97 (!) 154/110 (!) 160/112  Pulse: (!) 105 (!) 103 (!) 104 (!) 101  Resp: 15 17 (!) 22 17  Temp:      SpO2: 96% 96% 100% 97%  Weight:      Height:        General: Patient is a 44 year old obese Caucasian male who looks much older than the stated age. Eyes: PERRL, lids and conjunctivae normal ENMT: Mucous membranes are moist. Posterior pharynx clear of any exudate or lesions.Normal dentition.  Neck: normal, supple, no masses, no thyromegaly Respiratory: Patient is on 6 L of oxygen with nasal cannula.  Bilateral lung crackles on auscultation but no wheezing or rhonchi. Normal respiratory effort. No accessory muscle use.  Cardiovascular: Regular rate and rhythm, no murmurs / rubs / gallops.  2+ bilateral lower extremity edema. 2+ pedal pulses. No carotid bruits.  Abdomen: Soft nontender but distended.  No masses palpated. No hepatosplenomegaly. Bowel sounds positive.  Peritoneal dialysis port in place on left side of abdomen and no  redness or swelling around the port. Musculoskeletal: no clubbing / cyanosis. No joint deformity upper and lower extremities. Good ROM, no contractures. Normal muscle tone.  Skin: no rashes, lesions, ulcers. No induration Neurologic: CN 2-12 grossly intact. Sensation intact, DTR normal. Strength 5/5 in all 4.  Psychiatric: Normal judgment and insight. Alert and oriented x 3.  Normal mood.   (Anything < 9 systems with 2 bullets each down codes to level 1) (If patient refuses exam can't bill higher level) (Make sure to document decubitus ulcers present on admission -- if possible -- and whether patient has chronic indwelling catheter at time of admission)  Labs on Admission: I have personally  reviewed following labs and imaging studies  CBC: Recent Labs  Lab 06/30/19 2240  WBC 17.5*  NEUTROABS 7.8*  HGB 9.2*  HCT 31.6*  MCV 99.1  PLT 829*   Basic Metabolic Panel: Recent Labs  Lab 06/30/19 2240  NA 138  K 2.9*  CL 98  CO2 25  GLUCOSE 241*  BUN 29*  CREATININE 8.37*  CALCIUM 8.9   GFR: Estimated Creatinine Clearance: 15.6 mL/min (A) (by C-G formula based on SCr of 8.37 mg/dL (H)). Liver Function Tests: Recent Labs  Lab 06/30/19 2240  AST 18  ALT 14  ALKPHOS 100  BILITOT 1.3*  PROT 7.2  ALBUMIN 2.4*   No results for input(s): LIPASE, AMYLASE in the last 168 hours. No results for input(s): AMMONIA in the last 168 hours. Coagulation Profile: Recent Labs  Lab 06/28/19 0000 06/30/19 2240  INR 1.7* 1.9*   Cardiac Enzymes: No results for input(s): CKTOTAL, CKMB, CKMBINDEX, TROPONINI in the last 168 hours. BNP (last 3 results) No results for input(s): PROBNP in the last 8760 hours. HbA1C: No results for input(s): HGBA1C in the last 72 hours. CBG: No results for input(s): GLUCAP in the last 168 hours. Lipid Profile: No results for input(s): CHOL, HDL, LDLCALC, TRIG, CHOLHDL, LDLDIRECT in the last 72 hours. Thyroid Function Tests: No results for input(s): TSH, T4TOTAL, FREET4, T3FREE, THYROIDAB in the last 72 hours. Anemia Panel: No results for input(s): VITAMINB12, FOLATE, FERRITIN, TIBC, IRON, RETICCTPCT in the last 72 hours. Urine analysis: No results found for: COLORURINE, APPEARANCEUR, LABSPEC, PHURINE, GLUCOSEU, HGBUR, BILIRUBINUR, KETONESUR, PROTEINUR, UROBILINOGEN, NITRITE, LEUKOCYTESUR  Radiological Exams on Admission: DG  Chest Port 1 View  Result Date: 06/30/2019 CLINICAL DATA:  Shortness of breath, desaturation during dialysis EXAM: PORTABLE CHEST 1 VIEW COMPARISON:  05/22/2019 FINDINGS: Single frontal view of the chest demonstrates right internal jugular dialysis catheter tip overlying superior vena cava. Loop recorder overlies left chest. Cardiac silhouette is mildly enlarged. There is vascular congestion, with diffuse interstitial prominence and patchy bilateral perihilar airspace disease left greater than right. There are small bilateral pleural effusions. No pneumothorax. IMPRESSION: 1. Findings most consistent with pulmonary edema. Electronically Signed   By: Randa Ngo M.D.   On: 06/30/2019 23:13      Assessment/Plan Principal Problem:   Acute pulmonary edema (HCC) Patient presented with severe dyspnea and x-ray chest was positive for pulmonary edema and BNP was 866. Patient has a history of ESRD and on peritoneal dialysis. ED physician contacted on-call nephrologist who recommended to admit the patient to [facility] and patient will be evaluated by nephrology there in the morning for dialysis.  Active Problems: Hypertensive emergency Patient had blood pressure of 153/111 with pulmonary edema and started on nitroglycerin drip in the ED.  Blood pressure is still 150/105.  Continue nitroglycerin drip and titrate as needed.  Patient will be started on home blood pressure medications once the patient is weaned off nitroglycerin drip and home medications confirmation by pharmacy.  Acute hypoxic respiratory failure secondary to pulmonary edema Continue oxygen supplementation with nasal cannula to maintain oxygen saturation above 90%. Pulmonary edema will be treated with dialysis.    ESRD (end stage renal disease) ([place]) Patient has a history of ESRD and on peritoneal dialysis.  Patient will be transferred to [facility] as per on-call nephrology recommendations for evaluation and dialysis     Leukocytosis Patient has no signs or symptoms of any infection.  Leukocytosis is most likely reactive.  Continue to monitor.  Hyperglycemia in  the setting of diabetes mellitus Patient most probably has poorly controlled diabetes and the blood glucose was 241 on arrival to the hospital. Moderate dose sliding scale insulin ordered Blood glucose monitoring and hypoglycemic protocol in place.  History of DVT Patient is on warfarin.  Continue home warfarin  Continue home medications for chronic medical problems after confirmation of medications list by pharmacy.       DVT prophylaxis: INR is 1.9.  Continue home warfarin Code Status: Full code Family Communication: No family member present at bedside Disposition Plan:  Consults called: Nephrology Admission status: Observation/progressive care   Edmonia Lynch MD [facility] Pager [phone]   If 7PM-7AM, please contact night-coverage [email or web address] Password   07/01/2019, 3:22 AM

## 2019-07-01 NOTE — H&P (View-Only) (Signed)
History and Physical    Max Pittman. WUJ:[id number] DOB: 1975/09/18 DOA: 06/30/2019  PCP: Chesley Noon, MD (Confirm with patient/family/NH records and if not entered, this has to be entered at [facility] point of entry) Patient coming from: Home  I have personally briefly reviewed patient's old medical records in [place]  Chief Complaint: Worsening shortness of breath  HPI: Max Pittman. is a 44 y.o. male with medical history significant of hypertension, diabetes mellitus, ESRD on peritoneal dialysis and DVT on warfarin presented to ED for evaluation of respiratory distress.  Patient states that shortness of breath started suddenly while resting at home and continue to worsen.  Patient also reported increased swelling of bilateral lower extremities but denies fever, cough and chest pain.  Patient states that he is doing his peritoneal dialysis regularly but he is feeling that his body is retaining too much fluids that is why he is having abdominal distention and bilateral lower extremity edema.  Patient otherwise denies fever, chills, sore throat, dizziness chest pain,, nausea, vomiting, diarrhea, constipation and abdominal pain.  Patient states that he is still making some urine.  EMS found the patient really hypoxic with oxygen saturation in 70s and started him on oxygen supplementation with nasal cannula.  ED Course: On arrival to the ED patient had blood pressure of 153/111, heart rate 104, respiratory rate 19 and oxygen saturation 99 percent on  oxygen with nasal cannula while oxygen supplementation rate is not mentioned.  Blood work showed WBC 17.5, hemoglobin 9.2, sodium 138, potassium 2.9, BUN 29, creatinine 8.37, blood glucose 241 and BNP 866.  Covid test negative.  Chest x-ray was positive for acute pulmonary edema without any infiltrates or consolidation.  EKG showed sinus tachycardia with nonspecific T wave changes.  Patient was started on nitroglycerin drip for  hypertensive emergency and also placed on BiPAP by the ED physician and later on weaned off BiPAP and started on 6 L of oxygen with nasal cannula.  ED physician also spoke with on-call nephrologist/Dr. Carolin Sicks who felt that should be admitted to [facility] and he will be assessed by nephrology for dialysis.  Review of Systems: As per HPI otherwise 10 point review of systems negative.  Unacceptable ROS statements: "10 systems reviewed," "Extensive" (without elaboration).  Acceptable ROS statements: "All others negative," "All others reviewed and are negative," and "All others unremarkable," with at [place] documented Can't double dip - if using for HPI can't use for ROS  Past Medical History:  Diagnosis Date  . Arthritis   . Closed dislocation of left hip ([place]) 05/21/2019  . Diabetes ([place])   . Diabetes mellitus without complication ([place])   . History of peritoneal dialysis   . Hypertension   . Renal disorder    FFGS  . Renal disorder   . Stroke [facility])    when ha was a child  . Vasovagal syndrome    with syncope    Past Surgical History:  Procedure Laterality Date  . APPLICATION OF WOUND VAC Left 05/22/2019   Procedure: APPLICATION OF WOUND VAC  LEFT HIP;  Surgeon: Altamese [place], MD;  Location: [place];  Service: Orthopedics;  Laterality: Left;  . AV FISTULA INSERTION W/ RF MAGNETIC GUIDANCE Left   . AV FISTULA PLACEMENT    . BUBBLE STUDY  05/25/2019   Procedure: BUBBLE STUDY;  Surgeon: Dorothy Spark, MD;  Location: [place];  Service: Cardiovascular;;  . CHOLECYSTECTOMY    . HERNIA REPAIR    .  HIP CLOSED REDUCTION Left 05/20/2019   Procedure: CLOSED REDUCTION HIP WITH TRACTION PIN APPLICATION;  Surgeon: Altamese [name], MD;  Location: [place];  Service: Orthopedics;  Laterality: Left;  . IR FLUORO GUIDE CV LINE RIGHT  05/23/2019  . IR US GUIDE VASC ACCESS RIGHT  05/23/2019  . LEFT HEART CATH AND CORONARY ANGIOGRAPHY N/A 03/22/2019   Procedure: LEFT HEART CATH AND CORONARY  ANGIOGRAPHY;  Surgeon: Wellington [place], MD;  Location: [place] CV LAB;  Service: Cardiovascular;  Laterality: N/A;  . LOOP RECORDER INSERTION N/A 05/25/2019   Procedure: LOOP RECORDER INSERTION;  Surgeon: [name], MD;  Location: [place] CV LAB;  Service: Cardiovascular;  Laterality: N/A;  . ORIF ACETABULAR FRACTURE Left 05/22/2019   Procedure: OPEN REDUCTION INTERNAL FIXATION (ORIF) T TYPE  WITH ASSOCIATED POSTERIOR WALL ACETABULAR FRACTURE, LEFT; REMOVAL OF TRACTION PIN LEFT TIBIA;  Surgeon: Altamese [place], MD;  Location: [place];  Service: Orthopedics;  Laterality: Left;  . TEE WITHOUT CARDIOVERSION N/A 05/25/2019   Procedure: TRANSESOPHAGEAL ECHOCARDIOGRAM (TEE);  Surgeon: Dorothy Spark, MD;  [facility] END[facility];  Service: Cardiovascular;  Laterality: N/A;  . TONSILLECTOMY       reports that he quit smoking about 7 months ago. He has never used smokeless tobacco. He reports that he does not drink alcohol or use drugs.  Allergies  Allergen Reactions  . Doxycycline Hives  . Latex Swelling    Pt reports swelling at site.   . Doxycycline Hives  . Latex Swelling    Swelling at point of contact  . Morphine And Related Other (See Comments)    Causes anxiety  . Morphine And Related Anxiety    Family History  Problem Relation Age of Onset  . Heart disease Mother   . Diabetes Mother   . Hypertension Mother   . Diabetes Father   . Hypertension Father   . CAD Mother        "angina"  . Hypercholesterolemia Mother   . Hypercholesterolemia Father   . Healthy Brother     Unacceptable: Noncontributory, unremarkable, or negative. Acceptable: (example)Family history negative for heart disease  Prior to Admission medications   Medication Sig Start Date End Date Taking? Authorizing Provider  acetaminophen (TYLENOL) 325 MG tablet Take 2 tablets (650 mg total) by mouth every 8 (eight) hours. 06/16/19   Angiulli, Lavon Paganini, PA-C  aspirin EC 81 MG EC tablet Take 1 tablet (81  mg total) by mouth daily. 06/17/19   Angiulli, Lavon Paganini, PA-C  atorvastatin (LIPITOR) 40 MG tablet Take 1 tablet (40 mg total) by mouth daily at 6 PM. 06/16/19   Angiulli, Lavon Paganini, PA-C  buPROPion [facility] SR) 150 MG 12 hr tablet Take 1 tablet (150 mg total) by mouth 2 (two) times daily with a meal. 06/16/19   Angiulli, Lavon Paganini, PA-C  cholecalciferol (VITAMIN D) 25 MCG tablet Take 2 tablets (2,000 Units total) by mouth 2 (two) times daily. 06/16/19   Angiulli, Lavon Paganini, PA-C  docusate sodium (COLACE) 100 MG capsule As directed 05/16/19   [provider]  HYDROcodone-acetaminophen (NORCO/VICODIN) 5-325 MG tablet Take 1 tablet by mouth every 8 (eight) hours as needed. 06/20/19 07/20/19  Lorane Gell, NP  lidocaine (LIDODERM) 5 % Place 1 patch onto the skin daily. Remove & Discard patch within 12 hours or as directed by MD 06/16/19   Angiulli, Lavon Paganini, PA-C  loratadine (CLARITIN) 10 MG tablet Take 1 tablet (10 mg total) by mouth daily. 06/17/19   [place],  Lavon Paganini, PA-C  melatonin 5 MG TABS Take 1 tablet (5 mg total) by mouth at bedtime. 06/16/19   Angiulli, Lavon Paganini, PA-C  methocarbamol (ROBAXIN) 500 MG tablet Take 2 tablets (1,000 mg total) by mouth 3 (three) times daily. 06/16/19   Angiulli, Lavon Paganini, PA-C  omeprazole (PRILOSEC) 20 MG capsule Take 1 capsule (20 mg total) by mouth daily. 06/16/19   Angiulli, Lavon Paganini, PA-C  oxyCODONE (OXYCONTIN) 10 mg 12 hr tablet Take 1 tablet (10 mg total) by mouth daily. 06/17/19   Angiulli, Lavon Paganini, PA-C  pioglitazone (ACTOS) 15 MG tablet Take 1 tablet (15 mg total) by mouth daily. 06/17/19   Angiulli, Lavon Paganini, PA-C  polyethylene glycol (MIRALAX / GLYCOLAX) 17 g packet Take 17 g by mouth daily. 06/17/19   Angiulli, Lavon Paganini, PA-C  pregabalin (LYRICA) 50 MG capsule Take 1 capsule (50 mg total) by mouth at bedtime. 06/16/19   Angiulli, Lavon Paganini, PA-C  sevelamer carbonate (RENVELA) 800 MG tablet Take 3 tablets (2,400 mg total) by mouth 3 (three) times daily with  meals. 06/16/19   Angiulli, Lavon Paganini, PA-C  warfarin (COUMADIN) 2 MG tablet Take 1 tablet (2 mg total) by mouth daily. Or as directed by [facility] 06/16/19 10/14/19  Cathlyn Parsons, PA-C    Physical Exam: Vitals:   07/01/19 0300 07/01/19 0305 07/01/19 0310 07/01/19 0315  BP: (!) 139/99 (!) 135/97 (!) 154/110 (!) 160/112  Pulse: (!) 105 (!) 103 (!) 104 (!) 101  Resp: 15 17 (!) 22 17  Temp:      SpO2: 96% 96% 100% 97%  Weight:      Height:        Constitutional: NAD, calm, comfortable Vitals:   07/01/19 0300 07/01/19 0305 07/01/19 0310 07/01/19 0315  BP: (!) 139/99 (!) 135/97 (!) 154/110 (!) 160/112  Pulse: (!) 105 (!) 103 (!) 104 (!) 101  Resp: 15 17 (!) 22 17  Temp:      SpO2: 96% 96% 100% 97%  Weight:      Height:        General: Patient is a 44 year old obese Caucasian male who looks much older than the stated age. Eyes: PERRL, lids and conjunctivae normal ENMT: Mucous membranes are moist. Posterior pharynx clear of any exudate or lesions.Normal dentition.  Neck: normal, supple, no masses, no thyromegaly Respiratory: Patient is on 6 L of oxygen with nasal cannula.  Bilateral lung crackles on auscultation but no wheezing or rhonchi. Normal respiratory effort. No accessory muscle use.  Cardiovascular: Regular rate and rhythm, no murmurs / rubs / gallops.  2+ bilateral lower extremity edema. 2+ pedal pulses. No carotid bruits.  Abdomen: Soft nontender but distended.  No masses palpated. No hepatosplenomegaly. Bowel sounds positive.  Peritoneal dialysis port in place on left side of abdomen and no  redness or swelling around the port. Musculoskeletal: no clubbing / cyanosis. No joint deformity upper and lower extremities. Good ROM, no contractures. Normal muscle tone.  Skin: no rashes, lesions, ulcers. No induration Neurologic: CN 2-12 grossly intact. Sensation intact, DTR normal. Strength 5/5 in all 4.  Psychiatric: Normal judgment and insight. Alert and oriented x 3.  Normal mood.   (Anything < 9 systems with 2 bullets each down codes to level 1) (If patient refuses exam can't bill higher level) (Make sure to document decubitus ulcers present on admission -- if possible -- and whether patient has chronic indwelling catheter at time of admission)  Labs on Admission: I have personally  reviewed following labs and imaging studies  CBC: Recent Labs  Lab 06/30/19 2240  WBC 17.5*  NEUTROABS 7.8*  HGB 9.2*  HCT 31.6*  MCV 99.1  PLT 789*   Basic Metabolic Panel: Recent Labs  Lab 06/30/19 2240  NA 138  K 2.9*  CL 98  CO2 25  GLUCOSE 241*  BUN 29*  CREATININE 8.37*  CALCIUM 8.9   GFR: Estimated Creatinine Clearance: 15.6 mL/min (A) (by C-G formula based on SCr of 8.37 mg/dL (H)). Liver Function Tests: Recent Labs  Lab 06/30/19 2240  AST 18  ALT 14  ALKPHOS 100  BILITOT 1.3*  PROT 7.2  ALBUMIN 2.4*   No results for input(s): LIPASE, AMYLASE in the last 168 hours. No results for input(s): AMMONIA in the last 168 hours. Coagulation Profile: Recent Labs  Lab 06/28/19 0000 06/30/19 2240  INR 1.7* 1.9*   Cardiac Enzymes: No results for input(s): CKTOTAL, CKMB, CKMBINDEX, TROPONINI in the last 168 hours. BNP (last 3 results) No results for input(s): PROBNP in the last 8760 hours. HbA1C: No results for input(s): HGBA1C in the last 72 hours. CBG: No results for input(s): GLUCAP in the last 168 hours. Lipid Profile: No results for input(s): CHOL, HDL, LDLCALC, TRIG, CHOLHDL, LDLDIRECT in the last 72 hours. Thyroid Function Tests: No results for input(s): TSH, T4TOTAL, FREET4, T3FREE, THYROIDAB in the last 72 hours. Anemia Panel: No results for input(s): VITAMINB12, FOLATE, FERRITIN, TIBC, IRON, RETICCTPCT in the last 72 hours. Urine analysis: No results found for: COLORURINE, APPEARANCEUR, LABSPEC, PHURINE, GLUCOSEU, HGBUR, BILIRUBINUR, KETONESUR, PROTEINUR, UROBILINOGEN, NITRITE, LEUKOCYTESUR  Radiological Exams on Admission: DG  Chest Port 1 View  Result Date: 06/30/2019 CLINICAL DATA:  Shortness of breath, desaturation during dialysis EXAM: PORTABLE CHEST 1 VIEW COMPARISON:  05/22/2019 FINDINGS: Single frontal view of the chest demonstrates right internal jugular dialysis catheter tip overlying superior vena cava. Loop recorder overlies left chest. Cardiac silhouette is mildly enlarged. There is vascular congestion, with diffuse interstitial prominence and patchy bilateral perihilar airspace disease left greater than right. There are small bilateral pleural effusions. No pneumothorax. IMPRESSION: 1. Findings most consistent with pulmonary edema. Electronically Signed   By: Randa Ngo M.D.   On: 06/30/2019 23:13      Assessment/Plan Principal Problem:   Acute pulmonary edema (HCC) Patient presented with severe dyspnea and x-ray chest was positive for pulmonary edema and BNP was 866. Patient has a history of ESRD and on peritoneal dialysis. ED physician contacted on-call nephrologist who recommended to admit the patient to [facility] and patient will be evaluated by nephrology there in the morning for dialysis.  Active Problems: Hypertensive emergency Patient had blood pressure of 153/111 with pulmonary edema and started on nitroglycerin drip in the ED.  Blood pressure is still 150/105.  Continue nitroglycerin drip and titrate as needed.  Patient will be started on home blood pressure medications once the patient is weaned off nitroglycerin drip and home medications confirmation by pharmacy.  Acute hypoxic respiratory failure secondary to pulmonary edema Continue oxygen supplementation with nasal cannula to maintain oxygen saturation above 90%. Pulmonary edema will be treated with dialysis.    ESRD (end stage renal disease) ([place]) Patient has a history of ESRD and on peritoneal dialysis.  Patient will be transferred to [facility] as per on-call nephrology recommendations for evaluation and dialysis     Leukocytosis Patient has no signs or symptoms of any infection.  Leukocytosis is most likely reactive.  Continue to monitor.  Hyperglycemia in  the setting of diabetes mellitus Patient most probably has poorly controlled diabetes and the blood glucose was 241 on arrival to the hospital. Moderate dose sliding scale insulin ordered Blood glucose monitoring and hypoglycemic protocol in place.  History of DVT Patient is on warfarin.  Continue home warfarin  Continue home medications for chronic medical problems after confirmation of medications list by pharmacy.       DVT prophylaxis: INR is 1.9.  Continue home warfarin Code Status: Full code Family Communication: No family member present at bedside Disposition Plan:  Consults called: Nephrology Admission status: Observation/progressive care   Edmonia Lynch MD [facility] Pager [phone]   If 7PM-7AM, please contact night-coverage [email or web address] Password   07/01/2019, 3:22 AM

## 2019-07-02 DIAGNOSIS — N186 End stage renal disease: Secondary | ICD-10-CM

## 2019-07-02 DIAGNOSIS — I161 Hypertensive emergency: Secondary | ICD-10-CM

## 2019-07-02 LAB — GLUCOSE, CAPILLARY
Glucose-Capillary: 99 mg/dL (ref 70–99)
Glucose-Capillary: 105 mg/dL — ABNORMAL HIGH (ref 70–99)
Glucose-Capillary: 106 mg/dL — ABNORMAL HIGH (ref 70–99)
Glucose-Capillary: 166 mg/dL — ABNORMAL HIGH (ref 70–99)
Glucose-Capillary: 106 mg/dL — ABNORMAL HIGH (ref 70–99)
Glucose-Capillary: 97 mg/dL (ref 70–99)

## 2019-07-02 LAB — RENAL FUNCTION PANEL
Albumin: 1.8 g/dL — ABNORMAL LOW (ref 3.5–5.0)
Anion gap: 9 (ref 5–15)
BUN: 16 mg/dL (ref 6–20)
CO2: 27 mmol/L (ref 22–32)
Calcium: 8.1 mg/dL — ABNORMAL LOW (ref 8.9–10.3)
Chloride: 103 mmol/L (ref 98–111)
Creatinine, Ser: 5.85 mg/dL — ABNORMAL HIGH (ref 0.61–1.24)
GFR calc Af Amer: 13 mL/min — ABNORMAL LOW (ref >60)
GFR calc non Af Amer: 11 mL/min — ABNORMAL LOW (ref >60)
Glucose, Bld: 130 mg/dL — ABNORMAL HIGH (ref 70–99)
Phosphorus: 2.3 mg/dL — ABNORMAL LOW (ref 2.5–4.6)
Potassium: 3.1 mmol/L — ABNORMAL LOW (ref 3.5–5.1)
Sodium: 139 mmol/L (ref 135–145)

## 2019-07-02 LAB — MAGNESIUM: Magnesium: 1.5 mg/dL — ABNORMAL LOW (ref 1.7–2.4)

## 2019-07-02 LAB — IRON AND TIBC
Iron: 24 ug/dL — ABNORMAL LOW (ref 45–182)
Saturation Ratios: 14 % — ABNORMAL LOW (ref 17.9–39.5)
TIBC: 174 ug/dL — ABNORMAL LOW (ref 250–450)
UIBC: 150 ug/dL

## 2019-07-02 LAB — CBC
HCT: 25.8 % — ABNORMAL LOW (ref 39.0–52.0)
Hemoglobin: 7.8 g/dL — ABNORMAL LOW (ref 13.0–17.0)
MCH: 28.7 pg (ref 26.0–34.0)
MCHC: 30.2 g/dL (ref 30.0–36.0)
MCV: 94.9 fL (ref 80.0–100.0)
Platelets: 256 10*3/uL (ref 150–400)
RBC: 2.72 MIL/uL — ABNORMAL LOW (ref 4.22–5.81)
RDW: 15.4 % (ref 11.5–15.5)
WBC: 8.6 10*3/uL (ref 4.0–10.5)
nRBC: 0 % (ref 0.0–0.2)

## 2019-07-02 LAB — PROTIME-INR
INR: 1.9 — ABNORMAL HIGH (ref 0.8–1.2)
Prothrombin Time: 21.4 seconds — ABNORMAL HIGH (ref 11.4–15.2)

## 2019-07-02 LAB — HEPATITIS B SURFACE ANTIGEN: Hepatitis B Surface Ag: NONREACTIVE

## 2019-07-02 LAB — FERRITIN: Ferritin: 538 ng/mL — ABNORMAL HIGH (ref 24–336)

## 2019-07-02 MED ORDER — MAGNESIUM SULFATE 2 GM/50ML IV SOLN
2.0000 g | Freq: Once | INTRAVENOUS | Status: AC
  Administered 2019-07-02: 2 g via INTRAVENOUS
  Filled 2019-07-02: qty 50

## 2019-07-02 MED ORDER — HEPARIN SODIUM (PORCINE) 1000 UNIT/ML DIALYSIS: 3000.0000 [IU] | INTRAMUSCULAR | Status: DC | PRN

## 2019-07-02 MED ORDER — SODIUM CHLORIDE 0.9 % IV SOLN
250.0000 mg | Freq: Every day | INTRAVENOUS | Status: AC
  Administered 2019-07-02 – 2019-07-04 (×3): 250 mg via INTRAVENOUS
  Filled 2019-07-02 (×4): qty 20

## 2019-07-02 MED ORDER — AMLODIPINE BESYLATE 5 MG PO TABS
5.0000 mg | ORAL_TABLET | Freq: Every day | ORAL | Status: DC
  Administered 2019-07-02 – 2019-07-06 (×5): 5 mg via ORAL
  Filled 2019-07-02 (×4): qty 1

## 2019-07-02 MED ORDER — POTASSIUM CHLORIDE CRYS ER 20 MEQ PO TBCR
40.0000 meq | EXTENDED_RELEASE_TABLET | Freq: Once | ORAL | Status: AC
  Administered 2019-07-02: 40 meq via ORAL
  Filled 2019-07-02: qty 2

## 2019-07-02 MED ORDER — WARFARIN SODIUM 2 MG PO TABS
3.0000 mg | ORAL_TABLET | Freq: Once | ORAL | Status: AC
  Administered 2019-07-02: 3 mg via ORAL
  Filled 2019-07-02: qty 1

## 2019-07-02 MED ORDER — METOPROLOL TARTRATE 12.5 MG HALF TABLET
12.5000 mg | ORAL_TABLET | Freq: Two times a day (BID) | ORAL | Status: DC
  Administered 2019-07-02 – 2019-07-06 (×8): 12.5 mg via ORAL
  Filled 2019-07-02 (×8): qty 1

## 2019-07-02 NOTE — Progress Notes (Signed)
Pt cam back from dialysis. Vss. Call bell within reach. Lavenia Atlas, RN

## 2019-07-02 NOTE — Progress Notes (Signed)
HD tx resumed per Alric Seton PA. States she will make Dr. Jonnie Finner aware

## 2019-07-02 NOTE — Progress Notes (Signed)
°   07/02/19 0023  Hand-Off documentation  Handoff Given Given to shift RN/LPN  Report given to (Full Name) Nadine Counts, RN  Handoff Received Received from shift RN/LPN  Report received from (Full Name) Kayman Snuffer,RN  Vital Signs  Temp 98.1 F (36.7 C)  Temp Source Oral  Pulse Rate (!) 104  Pulse Rate Source Monitor  Resp 18  BP (!) 164/84  BP Location Right Arm  BP Method Automatic  Patient Position (if appropriate) Lying  Oxygen Therapy  SpO2 98 %  O2 Device Nasal Cannula  O2 Flow Rate (L/min) 4 L/min  Pain Assessment  Pain Scale 0-10  Pain Score 0  Post-Hemodialysis Assessment  Rinseback Volume (mL) 250 mL  KECN 282 V  Dialyzer Clearance Lightly streaked  Duration of HD Treatment -hour(s) 3.5 hour(s)  Hemodialysis Intake (mL) 500 mL  UF Total -Machine (mL) 4529 mL  Net UF (mL) 4029 mL  Tolerated HD Treatment Yes  Post-Hemodialysis Comments tx achieved as expected, pt is stable, no complaints  AVG/AVF Arterial Site Held (minutes) 0 minutes  AVG/AVF Venous Site Held (minutes) 0 minutes  Education / Care Plan  Dialysis Education Provided Yes  Hemodialysis Catheter Right Internal jugular Double lumen Permanent (Tunneled)  Placement Date/Time: 05/23/19 1531   Placed prior to admission: No  Time Out: Correct patient;Correct site;Correct procedure  Maximum sterile barrier precautions: Hand hygiene;Cap;Mask;Sterile gown;Sterile gloves;Large sterile sheet  Site Prep: Chlorh...  Site Condition No complications  Catheter fill solution Heparin 1000 units/ml  Catheter fill volume (Arterial) 1.6 cc  Catheter fill volume (Venous) 1.6  Dressing Type Occlusive  Interventions Other (Comment)  Post treatment catheter status Capped and Clamped

## 2019-07-02 NOTE — Significant Event (Addendum)
Rapid Response Event Note  Overview: Time Called: 1416 Arrival Time: 6060 Event Type: Cardiac, Respiratory While receiving HD pt began feeling like he could not breath, subsequently he became bradycardia and pale.   Initial Focused Assessment: Pt lying in bed. Alert and oriented. Pt is able to follow commands and moves all extremities. PERRLA, 65mm. Lung sounds are clear, diminished.  Pt remembers feeling like he couldn't breath and then awoke to staff placing on NRB mask on him. Per staff he was unresponsive to voice briefly, but responded to sternal rub. Pt was lying flat when event occurred, HOB elevated. NRB weaned to [facility]. Skin is warm, dry, pink.   VS: BP 153/103, HR 87, RR 28, SpO2 98% on 5LNC Per HD RN, pt's SBP remained >140 during event.  Interventions: -No intervention from RR RN -Dialysis treatment continued -CBG: 166  -Orders received from Dr. Wyline Copas: EKG, scheduled lopressor reduced from 25mg  to 12.5mg  BID, Norvasc added  Plan of Care (if not transferred): -Titrate oxygen to maintain saturation >92%  Call rapid response for additional needs  Event Summary: Name of Physician Notified: Dr. Wyline Copas at 1435 Outcome: Stayed in room and stabalized Event End Time: [place]
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
## 2019-07-02 NOTE — Progress Notes (Signed)
ANTICOAGULATION CONSULT NOTE - Follow Up Consult  Pharmacy Consult for warfarin Indication: DVT  Allergies  Allergen Reactions  . Doxycycline Hives  . Latex Swelling    Pt reports swelling at site.   . Morphine And Related Anxiety    Patient Measurements: Height: 5\' 9"  (175.3 cm) Weight: (!) 147.4 kg (324 lb 15.3 oz) IBW/kg (Calculated) : 70.7  Vital Signs: Temp: 98.5 F (36.9 C) (06/06 0826) Temp Source: Oral (06/06 0826) BP: 140/85 (06/06 0826) Pulse Rate: 99 (06/06 0826)  Labs: Recent Labs    06/30/19 2240 06/30/19 2240 07/01/19 0023 07/01/19 0506 07/01/19 0506 07/01/19 1343 07/02/19 0358  HGB 9.2*   < >  --  7.5*   < > 7.7* 7.8*  HCT 31.6*   < >  --  25.5*  --  25.5* 25.8*  PLT 401*   < >  --  291  --  289 256  LABPROT 20.8*  --   --  23.5*  --   --  21.4*  INR 1.9*  --   --  2.2*  --   --  1.9*  CREATININE 8.37*   < >  --  8.29*  --  8.64* 5.85*  TROPONINIHS 69*  --  83*  --   --   --   --    < > = values in this interval not displayed.    Estimated Creatinine Clearance: 23.4 mL/min (A) (by C-G formula based on SCr of 5.85 mg/dL (H)).   Medications:  Scheduled:  . Chlorhexidine Gluconate Cloth  6 each Topical Q0600  . Chlorhexidine Gluconate Cloth  6 each Topical Q0600  . insulin aspart  0-15 Units Subcutaneous TID WC  . insulin aspart  0-5 Units Subcutaneous QHS  . metoprolol tartrate  25 mg Oral BID  . warfarin  2 mg Oral q1600  . Warfarin - Pharmacist Dosing Inpatient   Does not apply q1600    Assessment: 53 yom on warfarin PTA for provoked LLE DVT on 06/04/2019. Plan at that time was to continue warfarin for 3 months. PTA dose per coumadin clinic note on 6/2 is 3 mg on Wednesday and Saturday, 2 mg all other days.   INR today is slightly sub-therapeutic at 1.9. Hgb stable at 7.8/25.8, plts wnl at 256. Charted to be eating ~65% of meals. Some abrasions, ecchymosis noted but no overt bleeding. No significant drug interactions noted.   Goal of  Therapy:  INR 2-3 Monitor platelets by anticoagulation protocol: Yes   Plan:  Warfarin 3 mg PO x1 tonight Daily INR, CBC Monitor for bleeding   Thank you,   Eddie Candle, PharmD PGY-1 Pharmacy Resident   Please check amion for clinical pharmacist contact number 07/02/2019,9:29 AM

## 2019-07-02 NOTE — Progress Notes (Signed)
PD cath drained 250 ml cloudy yellow fluid fibrin present.

## 2019-07-02 NOTE — Progress Notes (Signed)
Pt. Yelled out "help" this nurse noted pt. With hands around neck mouthing "I cant breathe" pt. Currently on 2L [place]. Pt. Placed on non-rebreather, rapid response called. Pt. Then noted to become pale and diaphoretic, eyes rolled to back of head, and unresponsive unable to arouse code blue activated. Pt. Bradycardic 52 on bedside telemetry monitor, pt previously NSR 70-80's preHD. Pt. Rinsed back and sternal rubbed pt. Became aroused and disoriented and agitated, but quickly came to and oriented. Code blue canceled no BLS given. Alric Seton PA at bedside. HD tx paused. Rapid Response nurse at bedside. Pt. stable

## 2019-07-02 NOTE — Progress Notes (Signed)
Notified by telemetry pt had 13 bt run of vtach, pt asymptomatic, vss. Notified on call hospitalist.

## 2019-07-02 NOTE — Progress Notes (Signed)
PROGRESS NOTE    Max Pittman.  CHE:[id number] DOB: [birth year] DOA: 06/30/2019 PCP: Chesley Noon, MD    Brief Narrative:  44 y.o. male with medical history significant of hypertension, diabetes mellitus, ESRD on peritoneal dialysis and DVT on warfarin presented to ED for evaluation of respiratory distress.  Patient states that shortness of breath started suddenly while resting at home and continue to worsen.  Patient also reported increased swelling of bilateral lower extremities but denies fever, cough and chest pain.  Patient states that he is doing his peritoneal dialysis regularly but he is feeling that his body is retaining too much fluids that is why he is having abdominal distention and bilateral lower extremity edema.  Patient otherwise denies fever, chills, sore throat, dizziness chest pain,, nausea, vomiting, diarrhea, constipation and abdominal pain.  Patient states that he is still making some urine.  EMS found the patient really hypoxic with oxygen saturation in 70s and started him on oxygen supplementation with nasal cannula.  ED Course: On arrival to the ED patient had blood pressure of 153/111, heart rate 104, respiratory rate 19 and oxygen saturation 99 percent on  oxygen with nasal cannula while oxygen supplementation rate is not mentioned.  Blood work showed WBC 17.5, hemoglobin 9.2, sodium 138, potassium 2.9, BUN 29, creatinine 8.37, blood glucose 241 and BNP 866.  Covid test negative.  Chest x-ray was positive for acute pulmonary edema without any infiltrates or consolidation.  EKG showed sinus tachycardia with nonspecific T wave changes.  Patient was started on nitroglycerin drip for hypertensive emergency and also placed on BiPAP by the ED physician and later on weaned off BiPAP and started on 6 L of oxygen with nasal cannula.  ED physician also spoke with on-call nephrologist/Dr. Carolin Sicks who felt that should be admitted to [facility] and he will be assessed by  nephrology for dialysis.  Assessment & Plan:   Principal Problem:   Acute pulmonary edema (HCC) Active Problems:   ESRD (end stage renal disease) ([place])   Pulmonary edema   Hypertensive emergency   Respiratory failure (HCC)  Active Problems: Hypertensive emergency -Pt presented with markedly elevated BP at presentation with clinical volume overload -Nephrology consulted and is following -Nitro gtt weaned off and pt was started on metoprolol at time of presentation -See RRT note. While on HD, pt had syncopal event associated with bradycardia, but while maintaining BP -EKG reviewed. Sinus -have decreased metoprolol dose to 12.5mg  and added norvasc. Cont to titrate bp meds accordingly -BP currently stable, albeit suboptimally controlled. Suspect BP will further improve as pt's volume status is corrected on HD  Acute hypoxic respiratory failure secondary to pulmonary edema -Currently remains O2 dependent, now on 5LNC -Cont to correct volume status via HD per Nephrology and wean O2 as tolerated    ESRD (end stage renal disease) ([place]) Patient has a history of ESRD and on peritoneal dialysis, presented with gross volume overload secondary to renal impairment -Nephrology following, was transferred from [facility] to undergo HD as tolerated. -Pt desires to continue with PD when ultimately discharged   Leukocytosis -Patient has no signs or symptoms of any infection.   -Leukocytosis is most likely reactive.   -Normalized this AM.  Hyperglycemia in the setting of diabetes mellitus -Patient most probably has poorly controlled diabetes and the blood glucose was 241 on arrival to the hospital. -continued on SSI coverage  History of DVT -Patient is on warfarin.  Continue home warfarin as tolerated per pharmacy  dosing  Syncope -Noted this afternoon on HD -Discussed with rapid response RN. Event noted to be transient and resolved quickly -See above, EKG reviewed. Have decreased beta  blocker dose and added norvasc for BP control  DVT prophylaxis: Coumadin Code Status: Full Family Communication: Pt in room, family not at bedside  Status is: Inpatient  Remains inpatient appropriate because:Persistent severe electrolyte disturbances, Ongoing diagnostic testing needed not appropriate for outpatient work up and Inpatient level of care appropriate due to severity of illness   Dispo: The patient is from: Home              Anticipated d/c is to: Home              Anticipated d/c date is: 2 days              Patient currently is not medically stable to d/c.   Consultants:   Nephrology  Procedures:     Antimicrobials: Anti-infectives (From admission, onward)   None       Subjective: This AM, reported feeling much better. Still feels generally swollen  Objective: Vitals:   07/02/19 1419 07/02/19 1430 07/02/19 1500 07/02/19 1530  BP: (!) 193/110 (!) 153/103 (!) 159/87 (!) 159/93  Pulse: 92 87 90 87  Resp:  (!) 28    Temp:      TempSrc:      SpO2:  98%    Weight:      Height:        Intake/Output Summary (Last 24 hours) at 07/02/2019 1548 Last data filed at 07/02/2019 0900 Gross per 24 hour  Intake 375.87 ml  Output 4029 ml  Net -3653.13 ml   Filed Weights   07/01/19 1330 07/01/19 2010 07/02/19 1330  Weight: (!) 152 kg (!) 147.4 kg (!) 143.9 kg    Examination: General exam: Awake, laying in bed, in nad Respiratory system: Normal respiratory effort, no wheezing Cardiovascular system: regular rate, s1, s2 Gastrointestinal system: Soft, nondistended, positive BS Central nervous system: CN2-12 grossly intact, strength intact Extremities: Perfused, no clubbing Skin: Normal skin turgor, no notable skin lesions seen Psychiatry: Mood normal // no visual hallucinations   Data Reviewed: I have personally reviewed following labs and imaging studies  CBC: Recent Labs  Lab 06/30/19 2240 07/01/19 0506 07/01/19 1343 07/02/19 0358  WBC 17.5* 9.5  13.3* 8.6  NEUTROABS 7.8*  --   --   --   HGB 9.2* 7.5* 7.7* 7.8*  HCT 31.6* 25.5* 25.5* 25.8*  MCV 99.1 97.3 95.9 94.9  PLT 401* 291 289 086   Basic Metabolic Panel: Recent Labs  Lab 06/30/19 2240 07/01/19 0506 07/01/19 1343 07/02/19 0358  NA 138 139 139 139  K 2.9* 2.9* 3.0* 3.1*  CL 98 99 98 103  CO2 25 28 25 27   GLUCOSE 241* 119* 122* 130*  BUN 29* 31* 28* 16  CREATININE 8.37* 8.29* 8.64* 5.85*  CALCIUM 8.9 8.6* 8.5* 8.1*  MG  --   --   --  1.5*  PHOS  --   --  3.7 2.3*   GFR: Estimated Creatinine Clearance: 23 mL/min (A) (by C-G formula based on SCr of 5.85 mg/dL (H)). Liver Function Tests: Recent Labs  Lab 06/30/19 2240 07/01/19 1343 07/02/19 0358  AST 18  --   --   ALT 14  --   --   ALKPHOS 100  --   --   BILITOT 1.3*  --   --   PROT 7.2  --   --  ALBUMIN 2.4* 2.0* 1.8*   No results for input(s): LIPASE, AMYLASE in the last 168 hours. No results for input(s): AMMONIA in the last 168 hours. Coagulation Profile: Recent Labs  Lab 06/28/19 0000 06/30/19 2240 07/01/19 0506 07/02/19 0358  INR 1.7* 1.9* 2.2* 1.9*   Cardiac Enzymes: No results for input(s): CKTOTAL, CKMB, CKMBINDEX, TROPONINI in the last 168 hours. BNP (last 3 results) No results for input(s): PROBNP in the last 8760 hours. HbA1C: No results for input(s): HGBA1C in the last 72 hours. CBG: Recent Labs  Lab 07/01/19 1730 07/02/19 0200 07/02/19 0630 07/02/19 1134 07/02/19 1427  GLUCAP 93 99 105* 106* 166*   Lipid Profile: No results for input(s): CHOL, HDL, LDLCALC, TRIG, CHOLHDL, LDLDIRECT in the last 72 hours. Thyroid Function Tests: No results for input(s): TSH, T4TOTAL, FREET4, T3FREE, THYROIDAB in the last 72 hours. Anemia Panel: Recent Labs    07/02/19 0358  FERRITIN 538*  TIBC 174*  IRON 24*   Sepsis Labs: No results for input(s): PROCALCITON, LATICACIDVEN in the last 168 hours.  Recent Results (from the past 240 hour(s))  SARS Coronavirus 2 by RT PCR (hospital  order, performed in [facility] hospital lab) Nasopharyngeal Nasopharyngeal Swab     Status: None   Collection Time: 06/30/19 11:08 PM   Specimen: Nasopharyngeal Swab  Result Value Ref Range Status   SARS Coronavirus 2 NEGATIVE NEGATIVE Final    Comment: (NOTE) SARS-CoV-2 target nucleic acids are NOT DETECTED. The SARS-CoV-2 RNA is generally detectable in upper and lower respiratory specimens during the acute phase of infection. The lowest concentration of SARS-CoV-2 viral copies this assay can detect is 250 copies / mL. A negative result does not preclude SARS-CoV-2 infection and should not be used as the sole basis for treatment or other patient management decisions.  A negative result may occur with improper specimen collection / handling, submission of specimen other than nasopharyngeal swab, presence of viral mutation(s) within the areas targeted by this assay, and inadequate number of viral copies (<250 copies / mL). A negative result must be combined with clinical observations, patient history, and epidemiological information. Fact Sheet for Patients:   StrictlyIdeas.no Fact Sheet for Healthcare Providers: BankingDealers.co.za This test is not yet approved or cleared  by the [place] FDA and has been authorized for detection and/or diagnosis of SARS-CoV-2 by FDA under an Emergency Use Authorization (EUA).  This EUA will remain in effect (meaning this test can be used) for the duration of the COVID-19 declaration under Section 564(b)(1) of the Act, 21 U.S.C. section 360bbb-3(b)(1), unless the authorization is terminated or revoked sooner. Performed at [facility], [address], [place], [place] [address]      Radiology Studies: [facility] Chest [facility] 1 View  Result Date: 06/30/2019 CLINICAL DATA:  Shortness of breath, desaturation during dialysis EXAM: PORTABLE CHEST 1 VIEW COMPARISON:  05/22/2019 FINDINGS: Single frontal view of  the chest demonstrates right internal jugular dialysis catheter tip overlying superior vena cava. Loop recorder overlies left chest. Cardiac silhouette is mildly enlarged. There is vascular congestion, with diffuse interstitial prominence and patchy bilateral perihilar airspace disease left greater than right. There are small bilateral pleural effusions. No pneumothorax. IMPRESSION: 1. Findings most consistent with pulmonary edema. Electronically Signed   By: Randa Ngo M.D.   On: 06/30/2019 23:13    Scheduled Meds: . amLODipine  5 mg Oral Daily  . Chlorhexidine Gluconate Cloth  6 each Topical Q0600  . Chlorhexidine Gluconate Cloth  6 each Topical Q0600  .  insulin aspart  0-15 Units Subcutaneous TID WC  . insulin aspart  0-5 Units Subcutaneous QHS  . metoprolol tartrate  12.5 mg Oral BID  . warfarin  3 mg Oral ONCE-1600  . Warfarin - Pharmacist Dosing Inpatient   Does not apply q1600   Continuous Infusions: . ferric gluconate (FERRLECIT/NULECIT) IV 250 mg (07/02/19 1251)     LOS: 1 day   Marylu Lund, MD [facility] Pager On Amion  If 7PM-7AM, please contact night-coverage 07/02/2019, 3:48 PM

## 2019-07-03 ENCOUNTER — Other Ambulatory Visit: Payer: Self-pay | Admitting: Orthopedic Surgery

## 2019-07-03 DIAGNOSIS — E559 Vitamin D deficiency, unspecified: Secondary | ICD-10-CM

## 2019-07-03 LAB — HEPARIN LEVEL (UNFRACTIONATED): Heparin Unfractionated: <0.1 IU/mL — ABNORMAL LOW (ref 0.30–0.70)

## 2019-07-03 LAB — CBC
HCT: 28.9 % — ABNORMAL LOW (ref 39.0–52.0)
Hemoglobin: 8.6 g/dL — ABNORMAL LOW (ref 13.0–17.0)
MCH: 28.2 pg (ref 26.0–34.0)
MCHC: 29.8 g/dL — ABNORMAL LOW (ref 30.0–36.0)
MCV: 94.8 fL (ref 80.0–100.0)
Platelets: 303 10*3/uL (ref 150–400)
RBC: 3.05 MIL/uL — ABNORMAL LOW (ref 4.22–5.81)
RDW: 15 % (ref 11.5–15.5)
WBC: 9.7 10*3/uL (ref 4.0–10.5)
nRBC: 0 % (ref 0.0–0.2)

## 2019-07-03 LAB — PROTIME-INR
INR: 1.5 — ABNORMAL HIGH (ref 0.8–1.2)
Prothrombin Time: 17.3 seconds — ABNORMAL HIGH (ref 11.4–15.2)

## 2019-07-03 LAB — RENAL FUNCTION PANEL
Albumin: 2 g/dL — ABNORMAL LOW (ref 3.5–5.0)
Anion gap: 10 (ref 5–15)
BUN: 14 mg/dL (ref 6–20)
CO2: 25 mmol/L (ref 22–32)
Calcium: 8.3 mg/dL — ABNORMAL LOW (ref 8.9–10.3)
Chloride: 101 mmol/L (ref 98–111)
Creatinine, Ser: 5.27 mg/dL — ABNORMAL HIGH (ref 0.61–1.24)
GFR calc Af Amer: 14 mL/min — ABNORMAL LOW (ref >60)
GFR calc non Af Amer: 12 mL/min — ABNORMAL LOW (ref >60)
Glucose, Bld: 131 mg/dL — ABNORMAL HIGH (ref 70–99)
Phosphorus: 2.4 mg/dL — ABNORMAL LOW (ref 2.5–4.6)
Potassium: 3.3 mmol/L — ABNORMAL LOW (ref 3.5–5.1)
Sodium: 136 mmol/L (ref 135–145)

## 2019-07-03 LAB — GLUCOSE, CAPILLARY
Glucose-Capillary: 130 mg/dL — ABNORMAL HIGH (ref 70–99)
Glucose-Capillary: 107 mg/dL — ABNORMAL HIGH (ref 70–99)
Glucose-Capillary: 141 mg/dL — ABNORMAL HIGH (ref 70–99)

## 2019-07-03 MED ORDER — HEPARIN SODIUM (PORCINE) 1000 UNIT/ML IJ SOLN
INTRAMUSCULAR | Status: AC
  Administered 2019-07-03: 3200 [IU]
  Filled 2019-07-03: qty 4

## 2019-07-03 MED ORDER — HEPARIN 1000 UNIT/ML FOR PERITONEAL DIALYSIS
INTRAPERITONEAL | Status: DC | PRN
  Filled 2019-07-03 (×2): qty 5000

## 2019-07-03 MED ORDER — WARFARIN SODIUM 4 MG PO TABS
4.0000 mg | ORAL_TABLET | Freq: Once | ORAL | Status: AC
  Administered 2019-07-03: 4 mg via ORAL
  Filled 2019-07-03: qty 1

## 2019-07-03 MED ORDER — HEPARIN (PORCINE) 25000 UT/250ML-% IV SOLN
1900.0000 [IU]/h | INTRAVENOUS | Status: DC
  Administered 2019-07-03: 1550 [IU]/h via INTRAVENOUS
  Administered 2019-07-03: 1250 [IU]/h via INTRAVENOUS
  Administered 2019-07-05: 1900 [IU]/h via INTRAVENOUS
  Filled 2019-07-03 (×5): qty 250

## 2019-07-03 MED ORDER — DELFLEX-LC/4.25% DEXTROSE 483 MOSM/L IP SOLN: INTRAPERITONEAL | Status: DC

## 2019-07-03 MED ORDER — DELFLEX-LC/2.5% DEXTROSE 394 MOSM/L IP SOLN: INTRAPERITONEAL | Status: DC

## 2019-07-03 MED ORDER — GENTAMICIN SULFATE 0.1 % EX CREA
1.0000 "application " | TOPICAL_CREAM | Freq: Every day | CUTANEOUS | Status: DC
  Filled 2019-07-03: qty 15

## 2019-07-03 MED ORDER — HEPARIN 1000 UNIT/ML FOR PERITONEAL DIALYSIS
500.0000 [IU] | INTRAMUSCULAR | Status: DC | PRN
  Filled 2019-07-03: qty 0.5

## 2019-07-03 NOTE — Progress Notes (Signed)
Patient is known to Navigator from previous admission. Navigator met with patient at bedside-he states he remembers Renal Navigator. He states he is feeling ok. Patient seems to be breathing heavily and twitching, which is different than Navigator knows of him in the past. Navigator asked if he is "ok" and he said, "yes." Navigator asked how PD has been going since his discharge from [place] recently. Patient states it is going well. He reports that he is out of the "big bags" and only has "yellow bags" at home currently. He states he was not out of big bags until right before admission to the hospital. Navigator sent message to patient's OP PD RN at his clinic, who responded that she has some in the office and that she can order some for patient.   Alphonzo Cruise, [place] Renal Navigator [phone]

## 2019-07-03 NOTE — Evaluation (Signed)
Physical Therapy Evaluation Patient Details Name: Max Pittman. MRN: [id number] DOB: [birth year] Today's Date: 07/03/2019   History of Present Illness  Patient is a 44 y/o male who presents with SOB and BLE swelling. CXR- acute pulmonary edema. Admitted with HTN emergency and Acute hypoxic respiratory failure secondary to pulmonary edema. PMH includes stroke, left hip dislocation s/p reduction in April 2021, ESRD on HD and/or peritoneal dialysis, DM, HTN.  Clinical Impression  Patient presents with LLE pain, dyspnea at rest worsened with exertion, decreased activity tolerance, impaired endurance and impaired mobility s/p above. Pt requires Min guard assist for short distance ambulation with use of RW. Sp02 dropped to low 80s on RA, HR 123 bpm. RR 31. 2/4 DOE noted. Limited mainly by SOB. Donned supplemental 02 for oxygen saturation to get >90%. Pt reports using RW for short distances and w/c for mobility at home PTA. Was getting HHPT as well. Has support of live in girlfriend. Will follow acutely to maximize independence and mobility prior to return home.    Follow Up Recommendations Home health PT;Supervision for mobility/OOB(continue [facility] services)    Equipment Recommendations  None recommended by PT    Recommendations for Other Services       Precautions / Restrictions Precautions Precautions: Fall Precaution Comments: watch 02 Restrictions Weight Bearing Restrictions: No      Mobility  Bed Mobility Overal bed mobility: Needs Assistance Bed Mobility: Supine to Sit     Supine to sit: Supervision;HOB elevated     General bed mobility comments: Use of rail, no physical assist needed.  Transfers Overall transfer level: Needs assistance Equipment used: Rolling walker (2 wheeled) Transfers: Sit to/from Stand Sit to Stand: Min guard         General transfer comment: Min guard for safety. Stood from [employer], cues for hand placement as pt wanting to pull up on  RW.  Ambulation/Gait Ambulation/Gait assistance: Min guard Gait Distance (Feet): 20 Feet Assistive device: Rolling walker (2 wheeled) Gait Pattern/deviations: Decreased stance time - left;Decreased step length - right;Step-through pattern Gait velocity: decreased   General Gait Details: Slow, mostly steady gait with 1 standing rest break. 2/4 DOE. Sp02 dropped to low 80s on RA. Cues for pursed lip breathing. HR up to 123 bpm max. RR 31. Donned 02 to get Sp02 back into 90s.  Stairs            Wheelchair Mobility    Modified Rankin (Stroke Patients Only)       Balance Overall balance assessment: Needs assistance Sitting-balance support: Feet supported;No upper extremity supported Sitting balance-Leahy Scale: Good Sitting balance - Comments: supervision   Standing balance support: During functional activity Standing balance-Leahy Scale: Poor Standing balance comment: Requires UE support                             Pertinent Vitals/Pain Pain Assessment: 0-10 Pain Score: 7  Pain Location: LLE Pain Descriptors / Indicators: Sore;Aching Pain Intervention(s): Repositioned;Monitored during session;Limited activity within patient's tolerance    Home Living Family/patient expects to be discharged to:: Private residence Living Arrangements: Spouse/significant other(g/f) Available Help at Discharge: Available 24 hours/day;Family Type of Home: Mobile home Home Access: Stairs to enter Entrance Stairs-Rails: Left Entrance Stairs-Number of Steps: 3 Home Layout: One level Home Equipment: Walker - 2 wheels;Wheelchair - manual      Prior Function Level of Independence: Needs assistance   Gait / Transfers Assistance Needed: Uses w/c vs RW  for mobility. Has been getting HHPT.  ADL's / Homemaking Assistance Needed: Able to perform ADLs with some difficulty.  Comments: Girlfriend was helping with peritonial dialysis.     Hand Dominance   Dominant Hand: Right     Extremity/Trunk Assessment   Upper Extremity Assessment Upper Extremity Assessment: Defer to OT evaluation    Lower Extremity Assessment Lower Extremity Assessment: RLE deficits/detail;LLE deficits/detail;Generalized weakness RLE Deficits / Details: Pitting edema present in foot and into leg with darkened skin changes distal LE LLE Deficits / Details: Pitting edema present in foot and into leg with darkened skin changes distal LE LLE Sensation: decreased light touch    Cervical / Trunk Assessment Cervical / Trunk Assessment: Normal  Communication   Communication: No difficulties  Cognition Arousal/Alertness: Awake/alert Behavior During Therapy: WFL for tasks assessed/performed;Anxious Overall Cognitive Status: Within Functional Limits for tasks assessed                                        General Comments General comments (skin integrity, edema, etc.): HR up to 123 bpm max, RR 31, sp02 low 80s on RA with activity. Donned 3L/min 02 [place] post session. RN aware.    Exercises     Assessment/Plan    PT Assessment Patient needs continued PT services  PT Problem List Decreased strength;Decreased mobility;Obesity;Pain;Impaired sensation;Decreased balance;Decreased activity tolerance;Cardiopulmonary status limiting activity;Decreased skin integrity       PT Treatment Interventions Gait training;Therapeutic exercise;Patient/family education;Therapeutic activities;Balance training;Functional mobility training    PT Goals (Current goals can be found in the Care Plan section)  Acute Rehab PT Goals Patient Stated Goal: to be able to breathe PT Goal Formulation: With patient Time For Goal Achievement: 07/17/19 Potential to Achieve Goals: Good    Frequency Min 3X/week   Barriers to discharge        Co-evaluation               AM-PAC PT "6 Clicks" Mobility  Outcome Measure Help needed turning from your back to your side while in a flat bed without using  bedrails?: None Help needed moving from lying on your back to sitting on the side of a flat bed without using bedrails?: A Little Help needed moving to and from a bed to a chair (including a wheelchair)?: A Little Help needed standing up from a chair using your arms (e.g., wheelchair or bedside chair)?: A Little Help needed to walk in hospital room?: A Little Help needed climbing 3-5 steps with a railing? : A Lot 6 Click Score: 18    End of Session   Activity Tolerance: Treatment limited secondary to medical complications (Comment)(dyspnea) Patient left: in bed;with call bell/phone within reach(sitting EOB) Nurse Communication: Mobility status;Other (comment)(need for 02) PT Visit Diagnosis: Pain;Other (comment);Muscle weakness (generalized) (M62.81);Difficulty in walking, not elsewhere classified (R26.2)(DOE) Pain - Right/Left: Left Pain - part of body: Leg    Time: 0981-1914 PT Time Calculation (min) (ACUTE ONLY): 28 min   Charges:   PT Evaluation $PT Eval Moderate Complexity: 1 Mod PT Treatments $Therapeutic Activity: 8-22 mins        Marisa Severin, PT, DPT Acute Rehabilitation Services Pager [phone] Office [place] 07/03/2019, 12:30 PM

## 2019-07-03 NOTE — Progress Notes (Deleted)
PT Cancellation Note  Patient Details Name: Max Pittman. MRN: [id number] DOB: 1975/06/01   Cancelled Treatment:    Reason Eval/Treat Not Completed: Medical issues which prohibited therapy Per RN, holding PT today as pt having heart cath and possibly RHC/LHC. Will follow.   [name] 07/03/2019, 7:45 AM Marisa Severin, PT, DPT Acute Rehabilitation Services Pager [phone] Office [phone]

## 2019-07-03 NOTE — Progress Notes (Signed)
Renal Navigator received message from patient's [name] PD RN requesting update. Brief update provided based on today's Renal note and faxed notes to clinic to provide continuity of care.  Alphonzo Cruise, [place] Renal Navigator [phone]

## 2019-07-03 NOTE — Progress Notes (Signed)
PROGRESS NOTE    [name]  GYJ:[id number] DOB: 07-10-1975 DOA: 06/30/2019 PCP: Chesley Noon, MD    Brief Narrative:  44 y.o. male with medical history significant of hypertension, diabetes mellitus, ESRD on peritoneal dialysis and DVT on warfarin presented to ED for evaluation of respiratory distress.  Patient states that shortness of breath started suddenly while resting at home and continue to worsen.  Patient also reported increased swelling of bilateral lower extremities but denies fever, cough and chest pain.  Patient states that he is doing his peritoneal dialysis regularly but he is feeling that his body is retaining too much fluids that is why he is having abdominal distention and bilateral lower extremity edema.  Patient otherwise denies fever, chills, sore throat, dizziness chest pain,, nausea, vomiting, diarrhea, constipation and abdominal pain.  Patient states that he is still making some urine.  EMS found the patient really hypoxic with oxygen saturation in 70s and started him on oxygen supplementation with nasal cannula.  ED Course: On arrival to the ED patient had blood pressure of 153/111, heart rate 104, respiratory rate 19 and oxygen saturation 99 percent on  oxygen with nasal cannula while oxygen supplementation rate is not mentioned.  Blood work showed WBC 17.5, hemoglobin 9.2, sodium 138, potassium 2.9, BUN 29, creatinine 8.37, blood glucose 241 and BNP 866.  Covid test negative.  Chest x-ray was positive for acute pulmonary edema without any infiltrates or consolidation.  EKG showed sinus tachycardia with nonspecific T wave changes.  Patient was started on nitroglycerin drip for hypertensive emergency and also placed on BiPAP by the ED physician and later on weaned off BiPAP and started on 6 L of oxygen with nasal cannula.  ED physician also spoke with on-call nephrologist/Dr. Carolin Sicks who felt that should be admitted to [facility] and he will be assessed by  nephrology for dialysis.  Assessment & Plan:   Principal Problem:   Acute pulmonary edema (HCC) Active Problems:   ESRD (end stage renal disease) ([place])   Pulmonary edema   Hypertensive emergency   Respiratory failure (HCC)  Active Problems: Hypertensive emergency -Pt presented with markedly elevated BP at presentation with clinical volume overload -Nephrology consulted and is following -Nitro gtt weaned off and pt was started on metoprolol at time of presentation -See RRT note. While on HD, pt had syncopal event associated with bradycardia, but while maintaining BP -EKG reviewed. Sinus -have decreased metoprolol dose to 12.5mg  and added norvasc. Cont to titrate bp meds accordingly -BP currently stable, cont HD as tolerated  Acute hypoxic respiratory failure secondary to pulmonary edema -Currently remains O2 dependent, now on 5LNC -Cont to correct volume status via HD per Nephrology and cont to wean o2 as tolerated, down to [facility] this AM    ESRD (end stage renal disease) ([place]) Patient has a history of ESRD and on peritoneal dialysis, presented with gross volume overload secondary to renal impairment -Nephrology following, was transferred from [facility] to [facility] to undergo HD as tolerated. -Pt desires to continue with PD when ultimately discharged -For HD today   Leukocytosis -Patient has no signs or symptoms of any infection.   -Leukocytosis is most likely reactive.   -Normalized this AM.  Hyperglycemia in the setting of diabetes mellitus -Patient most probably has poorly controlled diabetes and the blood glucose was 241 on arrival to the hospital. -continued on SSI coverage  History of DVT -Patient is on warfarin.  Continue home warfarin as tolerated per pharmacy dosing  Syncope -Noted this afternoon on HD -Discussed with rapid response RN. Event noted to be transient and resolved quickly -See above, EKG reviewed. Have decreased beta blocker dose and added norvasc for BP  control  DVT prophylaxis: Coumadin Code Status: Full Family Communication: Pt in room, family not at bedside  Status is: Inpatient  Remains inpatient appropriate because:Persistent severe electrolyte disturbances, Ongoing diagnostic testing needed not appropriate for outpatient work up and Inpatient level of care appropriate due to severity of illness   Dispo: The patient is from: Home              Anticipated d/c is to: Home              Anticipated d/c date is: 2 days              Patient currently is not medically stable to d/c.   Consultants:   Nephrology  Procedures:     Antimicrobials: Anti-infectives (From admission, onward)   None      Subjective: States feeling better. Legs still swollen  Objective: Vitals:   07/03/19 1430 07/03/19 1452 07/03/19 1500 07/03/19 1544  BP: (!) 164/94 134/84 (!) 144/91 (!) 154/98  Pulse: (!) 101 (!) 101 (!) 102 90  Resp:   18 18  Temp:   97.9 F (36.6 C) 97.8 F (36.6 C)  TempSrc:   Oral Oral  SpO2:   96% 99%  Weight:   132.5 kg   Height:        Intake/Output Summary (Last 24 hours) at 07/03/2019 1816 Last data filed at 07/03/2019 1500 Gross per 24 hour  Intake 600 ml  Output 3703 ml  Net -3103 ml   Filed Weights   07/03/19 0606 07/03/19 1135 07/03/19 1500  Weight: (!) 138.8 kg (!) 136.8 kg 132.5 kg    Examination: General exam: Conversant, in no acute distress Respiratory system: normal chest rise, clear, no audible wheezing Cardiovascular system: regular rhythm, s1-s2 Gastrointestinal system: Nondistended, nontender, pos BS Central nervous system: No seizures, no tremors Extremities: No cyanosis, no joint deformities, BLE edema Skin: No rashes, no pallor Psychiatry: Affect normal // no auditory hallucinations   Data Reviewed: I have personally reviewed following labs and imaging studies  CBC: Recent Labs  Lab 06/30/19 2240 07/01/19 0506 07/01/19 1343 07/02/19 0358 07/03/19 0219  WBC 17.5* 9.5 13.3*  8.6 9.7  NEUTROABS 7.8*  --   --   --   --   HGB 9.2* 7.5* 7.7* 7.8* 8.6*  HCT 31.6* 25.5* 25.5* 25.8* 28.9*  MCV 99.1 97.3 95.9 94.9 94.8  PLT 401* 291 289 256 409   Basic Metabolic Panel: Recent Labs  Lab 06/30/19 2240 07/01/19 0506 07/01/19 1343 07/02/19 0358 07/03/19 1225  NA 138 139 139 139 136  K 2.9* 2.9* 3.0* 3.1* 3.3*  CL 98 99 98 103 101  CO2 25 28 25 27 25   GLUCOSE 241* 119* 122* 130* 131*  BUN 29* 31* 28* 16 14  CREATININE 8.37* 8.29* 8.64* 5.85* 5.27*  CALCIUM 8.9 8.6* 8.5* 8.1* 8.3*  MG  --   --   --  1.5*  --   PHOS  --   --  3.7 2.3* 2.4*   GFR: Estimated Creatinine Clearance: 24.4 mL/min (A) (by C-G formula based on SCr of 5.27 mg/dL (H)). Liver Function Tests: Recent Labs  Lab 06/30/19 2240 07/01/19 1343 07/02/19 0358 07/03/19 1225  AST 18  --   --   --   ALT 14  --   --   --  ALKPHOS 100  --   --   --   BILITOT 1.3*  --   --   --   PROT 7.2  --   --   --   ALBUMIN 2.4* 2.0* 1.8* 2.0*   No results for input(s): LIPASE, AMYLASE in the last 168 hours. No results for input(s): AMMONIA in the last 168 hours. Coagulation Profile: Recent Labs  Lab 06/28/19 0000 06/30/19 2240 07/01/19 0506 07/02/19 0358 07/03/19 0219  INR 1.7* 1.9* 2.2* 1.9* 1.5*   Cardiac Enzymes: No results for input(s): CKTOTAL, CKMB, CKMBINDEX, TROPONINI in the last 168 hours. BNP (last 3 results) No results for input(s): PROBNP in the last 8760 hours. HbA1C: No results for input(s): HGBA1C in the last 72 hours. CBG: Recent Labs  Lab 07/02/19 1427 07/02/19 1815 07/02/19 2136 07/03/19 0643 07/03/19 1521  GLUCAP 166* 106* 97 130* 107*   Lipid Profile: No results for input(s): CHOL, HDL, LDLCALC, TRIG, CHOLHDL, LDLDIRECT in the last 72 hours. Thyroid Function Tests: No results for input(s): TSH, T4TOTAL, FREET4, T3FREE, THYROIDAB in the last 72 hours. Anemia Panel: Recent Labs    07/02/19 0358  FERRITIN 538*  TIBC 174*  IRON 24*   Sepsis Labs: No results  for input(s): PROCALCITON, LATICACIDVEN in the last 168 hours.  Recent Results (from the past 240 hour(s))  SARS Coronavirus 2 by RT PCR (hospital order, performed in [facility] hospital lab) Nasopharyngeal Nasopharyngeal Swab     Status: None   Collection Time: 06/30/19 11:08 PM   Specimen: Nasopharyngeal Swab  Result Value Ref Range Status   SARS Coronavirus 2 NEGATIVE NEGATIVE Final    Comment: (NOTE) SARS-CoV-2 target nucleic acids are NOT DETECTED. The SARS-CoV-2 RNA is generally detectable in upper and lower respiratory specimens during the acute phase of infection. The lowest concentration of SARS-CoV-2 viral copies this assay can detect is 250 copies / mL. A negative result does not preclude SARS-CoV-2 infection and should not be used as the sole basis for treatment or other patient management decisions.  A negative result may occur with improper specimen collection / handling, submission of specimen other than nasopharyngeal swab, presence of viral mutation(s) within the areas targeted by this assay, and inadequate number of viral copies (<250 copies / mL). A negative result must be combined with clinical observations, patient history, and epidemiological information. Fact Sheet for Patients:   StrictlyIdeas.no Fact Sheet for Healthcare Providers: BankingDealers.co.za This test is not yet approved or cleared  by the [place] FDA and has been authorized for detection and/or diagnosis of SARS-CoV-2 by FDA under an Emergency Use Authorization (EUA).  This EUA will remain in effect (meaning this test can be used) for the duration of the COVID-19 declaration under Section 564(b)(1) of the Act, 21 U.S.C. section 360bbb-3(b)(1), unless the authorization is terminated or revoked sooner. Performed at [facility], [address], [place], [place] [address]      Radiology Studies: No results found.  Scheduled Meds: . amLODipine   5 mg Oral Daily  . Chlorhexidine Gluconate Cloth  6 each Topical Q0600  . Chlorhexidine Gluconate Cloth  6 each Topical Q0600  . gentamicin cream  1 application Topical Daily  . insulin aspart  0-15 Units Subcutaneous TID WC  . insulin aspart  0-5 Units Subcutaneous QHS  . metoprolol tartrate  12.5 mg Oral BID  . Warfarin - Pharmacist Dosing Inpatient   Does not apply q1600   Continuous Infusions: . dialysis solution 2.5% low-MG/low-CA    .  dialysis solution 4.25% low-MG/low-CA    . ferric gluconate (FERRLECIT/NULECIT) IV Stopped (07/03/19 1447)  . heparin 1,550 Units/hr (07/03/19 1815)     LOS: 2 days   Marylu Lund, MD [facility] Pager On Amion  If 7PM-7AM, please contact night-coverage 07/03/2019, 6:16 PM

## 2019-07-03 NOTE — Progress Notes (Signed)
ANTICOAGULATION CONSULT NOTE - Follow Up Consult  Pharmacy Consult for warfarin, add heparin Indication: DVT  Allergies  Allergen Reactions  . Doxycycline Hives  . Latex Swelling    Pt reports swelling at site.   . Morphine And Related Anxiety    Patient Measurements: Height: 5\' 9"  (175.3 cm) Weight: 132.5 kg (292 lb 1.8 oz) IBW/kg (Calculated) : 70.7  Vital Signs: Temp: 97.8 F (36.6 C) (06/07 1544) Temp Source: Oral (06/07 1544) BP: 154/98 (06/07 1544) Pulse Rate: 90 (06/07 1544)  Labs: Recent Labs    06/30/19 2240 06/30/19 2240 07/01/19 0023 07/01/19 0506 07/01/19 0506 07/01/19 1343 07/01/19 1343 07/02/19 0358 07/03/19 0219 07/03/19 1225 07/03/19 1630  HGB 9.2*   < >  --  7.5*   < > 7.7*   < > 7.8* 8.6*  --   --   HCT 31.6*   < >  --  25.5*   < > 25.5*  --  25.8* 28.9*  --   --   PLT 401*   < >  --  291   < > 289  --  256 303  --   --   LABPROT 20.8*   < >  --  23.5*  --   --   --  21.4* 17.3*  --   --   INR 1.9*   < >  --  2.2*  --   --   --  1.9* 1.5*  --   --   HEPARINUNFRC  --   --   --   --   --   --   --   --   --   --  <0.10*  CREATININE 8.37*   < >  --  8.29*   < > 8.64*  --  5.85*  --  5.27*  --   TROPONINIHS 69*  --  83*  --   --   --   --   --   --   --   --    < > = values in this interval not displayed.    Estimated Creatinine Clearance: 24.4 mL/min (A) (by C-G formula based on SCr of 5.27 mg/dL (H)).   Medications:  Scheduled:  . amLODipine  5 mg Oral Daily  . Chlorhexidine Gluconate Cloth  6 each Topical Q0600  . Chlorhexidine Gluconate Cloth  6 each Topical Q0600  . gentamicin cream  1 application Topical Daily  . insulin aspart  0-15 Units Subcutaneous TID WC  . insulin aspart  0-5 Units Subcutaneous QHS  . metoprolol tartrate  12.5 mg Oral BID  . warfarin  4 mg Oral ONCE-1600  . Warfarin - Pharmacist Dosing Inpatient   Does not apply q1600    Assessment: 22 yom on warfarin PTA for provoked LLE DVT on 06/04/2019. Plan at that time  was to continue warfarin for 3 months. PTA dose per coumadin clinic note on 6/2 is 3 mg on Wednesday and Saturday, 2 mg all other days.   INR today is sub-therapeutic at 1.5. Hgb stable at 8 .6, plts ok. Charted to be eating ~65% of meals. Some abrasions, ecchymosis noted but no overt bleeding. No significant drug interactions noted.   Discussed with Dr. Wyline Copas, will start IV heparin as bridge while INR less than goal.  PM heparin level < 0.10  Goal of Therapy:  Heparin level 0.3-0.7 INR 2-3 Monitor platelets by anticoagulation protocol: Yes   Plan:  Increase heparin to 1550 units / hr  Check heparin level 8 hrs after gtt increased Daily heparin level and CBC. Will stop heparin once INR > 2  Thank you Anette Guarneri, PharmD   07/03/2019 5:30 PM   [facility] pharmacy phone numbers are listed on [email or web address]

## 2019-07-03 NOTE — Progress Notes (Signed)
Pt's HD tx terminated with approximately 45 minutes left per pt request, signed AMA paperwork. Pt reports "not feeling well". Vital signs stable, no distress noted.  Pt received dialysis for 3.25 hours. Dr. Posey Pronto notified.

## 2019-07-03 NOTE — Progress Notes (Signed)
ANTICOAGULATION CONSULT NOTE - Follow Up Consult  Pharmacy Consult for warfarin, add heparin Indication: DVT  Allergies  Allergen Reactions  . Doxycycline Hives  . Latex Swelling    Pt reports swelling at site.   . Morphine And Related Anxiety    Patient Measurements: Height: 5\' 9"  (175.3 cm) Weight: (!) 138.8 kg (306 lb) IBW/kg (Calculated) : 70.7  Vital Signs: Temp: 98.2 F (36.8 C) (06/07 0739) Temp Source: Oral (06/07 0739) BP: 159/82 (06/07 0739) Pulse Rate: 97 (06/07 0739)  Labs: Recent Labs    06/30/19 2240 06/30/19 2240 07/01/19 0023 07/01/19 0506 07/01/19 0506 07/01/19 1343 07/01/19 1343 07/02/19 0358 07/03/19 0219  HGB 9.2*   < >  --  7.5*   < > 7.7*   < > 7.8* 8.6*  HCT 31.6*   < >  --  25.5*   < > 25.5*  --  25.8* 28.9*  PLT 401*   < >  --  291   < > 289  --  256 303  LABPROT 20.8*   < >  --  23.5*  --   --   --  21.4* 17.3*  INR 1.9*   < >  --  2.2*  --   --   --  1.9* 1.5*  CREATININE 8.37*   < >  --  8.29*  --  8.64*  --  5.85*  --   TROPONINIHS 69*  --  83*  --   --   --   --   --   --    < > = values in this interval not displayed.    Estimated Creatinine Clearance: 22.5 mL/min (A) (by C-G formula based on SCr of 5.85 mg/dL (H)).   Medications:  Scheduled:  . amLODipine  5 mg Oral Daily  . Chlorhexidine Gluconate Cloth  6 each Topical Q0600  . Chlorhexidine Gluconate Cloth  6 each Topical Q0600  . insulin aspart  0-15 Units Subcutaneous TID WC  . insulin aspart  0-5 Units Subcutaneous QHS  . metoprolol tartrate  12.5 mg Oral BID  . warfarin  4 mg Oral ONCE-1600  . Warfarin - Pharmacist Dosing Inpatient   Does not apply q1600    Assessment: 15 yom on warfarin PTA for provoked LLE DVT on 06/04/2019. Plan at that time was to continue warfarin for 3 months. PTA dose per coumadin clinic note on 6/2 is 3 mg on Wednesday and Saturday, 2 mg all other days.   INR today is sub-therapeutic at 1.5. Hgb stable at 8 .6, plts ok. Charted to be eating  ~65% of meals. Some abrasions, ecchymosis noted but no overt bleeding. No significant drug interactions noted.   Discussed with Dr. Wyline Copas, will start IV heparin as bridge while INR less than goal.  Goal of Therapy:  Heparin level 0.3-0.7 INR 2-3 Monitor platelets by anticoagulation protocol: Yes   Plan:  Start IV heparin at units/hr with no bolus given INR 1.5. Check heparin level 8 hrs after gtt starts Daily heparin level and CBC. Will stop heparin once INR > 2  Warfarin 4 mg PO x1 tonight Daily INR Monitor for bleeding   Uvaldo Rising, BCPS, BCCP Clinical Pharmacist  07/03/2019 8:17 AM   [facility] pharmacy phone numbers are listed on [email or web address]

## 2019-07-03 NOTE — Progress Notes (Signed)
Patient ID: Max Pittman., male   DOB: 1975/09/12, 44 y.o.   MRN: [id number] [facility] Progress Note   Assessment/ Plan:   1.  Volume overload/acute hypoxic respiratory failure: Ongoing ultrafiltration with peritoneal dialysis after he has been predominantly hypotonic peritoneal dialysate as an outpatient resulting in current volume status.  He underwent hemodialysis to increase the rate of ultrafiltration over the weekend.  I will order for hemodialysis again today and CCPD nightly. 2. ESRD: On peritoneal dialysis with an indwelling right IJ TDC.  With hemodialysis in addition to CCPD to try and promote ultrafiltration. 3. Anemia of ESRD/chronic kidney disease: Ongoing intravenous iron, on ESA.  No indication for PRBC transfusion. 4. CKD-MBD: Continue renal diet, not on phosphorus binders.  Calcium level acceptable. 5. Nutrition: Continue nutritional supplementation with ongoing renal diet. 6. Hypertension: Blood pressures remain elevated, monitor with ultrafiltration/dialysis.  Subjective:   Reports that he still gets short of breath with modest movement.   Objective:   BP (!) 159/82 (BP Location: Right Arm)   Pulse 97   Temp 98.2 F (36.8 C) (Oral)   Resp 17   Ht 5\' 9"  (1.753 m)   Wt (!) 138.8 kg   SpO2 95%   BMI 45.19 kg/m   Physical Exam: Gen: Obese man, resting comfortably in bed CVS: Pulse regular rhythm, normal rate, S1 and S2 normal Resp: Fine rales over both bases with some intermittent expiratory rhonchi. Abd: Soft, obese, nontender Ext: 2+ bilateral lower extremity edema  Labs: BMET Recent Labs  Lab 06/30/19 2240 07/01/19 0506 07/01/19 1343 07/02/19 0358  NA 138 139 139 139  K 2.9* 2.9* 3.0* 3.1*  CL 98 99 98 103  CO2 25 28 25 27   GLUCOSE 241* 119* 122* 130*  BUN 29* 31* 28* 16  CREATININE 8.37* 8.29* 8.64* 5.85*  CALCIUM 8.9 8.6* 8.5* 8.1*  PHOS  --   --  3.7 2.3*   CBC Recent Labs  Lab 06/30/19 2240 06/30/19 2240  07/01/19 0506 07/01/19 1343 07/02/19 0358 07/03/19 0219  WBC 17.5*   < > 9.5 13.3* 8.6 9.7  NEUTROABS 7.8*  --   --   --   --   --   HGB 9.2*   < > 7.5* 7.7* 7.8* 8.6*  HCT 31.6*   < > 25.5* 25.5* 25.8* 28.9*  MCV 99.1   < > 97.3 95.9 94.9 94.8  PLT 401*   < > 291 289 256 303   < > = values in this interval not displayed.     Medications:    . amLODipine  5 mg Oral Daily  . Chlorhexidine Gluconate Cloth  6 each Topical Q0600  . Chlorhexidine Gluconate Cloth  6 each Topical Q0600  . insulin aspart  0-15 Units Subcutaneous TID WC  . insulin aspart  0-5 Units Subcutaneous QHS  . metoprolol tartrate  12.5 mg Oral BID  . warfarin  4 mg Oral ONCE-1600  . Warfarin - Pharmacist Dosing Inpatient   Does not apply q1600   Elmarie Shiley, MD 07/03/2019, 9:29 AM

## 2019-07-04 LAB — RENAL FUNCTION PANEL
Albumin: 2 g/dL — ABNORMAL LOW (ref 3.5–5.0)
Anion gap: 10 (ref 5–15)
BUN: 10 mg/dL (ref 6–20)
CO2: 26 mmol/L (ref 22–32)
Calcium: 8.6 mg/dL — ABNORMAL LOW (ref 8.9–10.3)
Chloride: 101 mmol/L (ref 98–111)
Creatinine, Ser: 4.67 mg/dL — ABNORMAL HIGH (ref 0.61–1.24)
GFR calc Af Amer: 17 mL/min — ABNORMAL LOW (ref >60)
GFR calc non Af Amer: 14 mL/min — ABNORMAL LOW (ref >60)
Glucose, Bld: 116 mg/dL — ABNORMAL HIGH (ref 70–99)
Phosphorus: 2.3 mg/dL — ABNORMAL LOW (ref 2.5–4.6)
Potassium: 2.9 mmol/L — ABNORMAL LOW (ref 3.5–5.1)
Sodium: 137 mmol/L (ref 135–145)

## 2019-07-04 LAB — CBC
HCT: 28.4 % — ABNORMAL LOW (ref 39.0–52.0)
Hemoglobin: 8.5 g/dL — ABNORMAL LOW (ref 13.0–17.0)
MCH: 28.5 pg (ref 26.0–34.0)
MCHC: 29.9 g/dL — ABNORMAL LOW (ref 30.0–36.0)
MCV: 95.3 fL (ref 80.0–100.0)
Platelets: 329 10*3/uL (ref 150–400)
RBC: 2.98 MIL/uL — ABNORMAL LOW (ref 4.22–5.81)
RDW: 15.1 % (ref 11.5–15.5)
WBC: 8.7 10*3/uL (ref 4.0–10.5)
nRBC: 0.5 % — ABNORMAL HIGH (ref 0.0–0.2)

## 2019-07-04 LAB — HEPARIN LEVEL (UNFRACTIONATED)
Heparin Unfractionated: 0.12 IU/mL — ABNORMAL LOW (ref 0.30–0.70)
Heparin Unfractionated: 0.15 IU/mL — ABNORMAL LOW (ref 0.30–0.70)
Heparin Unfractionated: 0.26 IU/mL — ABNORMAL LOW (ref 0.30–0.70)

## 2019-07-04 LAB — GLUCOSE, CAPILLARY
Glucose-Capillary: 112 mg/dL — ABNORMAL HIGH (ref 70–99)
Glucose-Capillary: 133 mg/dL — ABNORMAL HIGH (ref 70–99)
Glucose-Capillary: 109 mg/dL — ABNORMAL HIGH (ref 70–99)
Glucose-Capillary: 123 mg/dL — ABNORMAL HIGH (ref 70–99)

## 2019-07-04 LAB — PROTIME-INR
INR: 1.4 — ABNORMAL HIGH (ref 0.8–1.2)
Prothrombin Time: 16.4 seconds — ABNORMAL HIGH (ref 11.4–15.2)

## 2019-07-04 MED ORDER — WARFARIN SODIUM 5 MG PO TABS
5.0000 mg | ORAL_TABLET | Freq: Once | ORAL | Status: AC
  Administered 2019-07-04: 5 mg via ORAL
  Filled 2019-07-04: qty 1

## 2019-07-04 MED ORDER — HEPARIN SODIUM (PORCINE) 1000 UNIT/ML IJ SOLN
INTRAMUSCULAR | Status: AC
  Filled 2019-07-04: qty 4

## 2019-07-04 MED ORDER — WARFARIN SODIUM 4 MG PO TABS: 4.0000 mg | ORAL_TABLET | Freq: Once | ORAL | Status: DC

## 2019-07-04 NOTE — Progress Notes (Signed)
ANTICOAGULATION CONSULT NOTE - Follow Up Consult  Pharmacy Consult for warfarin and IV heparin Indication:  DVT  Allergies  Allergen Reactions  . Doxycycline Hives  . Latex Swelling    Pt reports swelling at site.   . Morphine And Related Anxiety    Patient Measurements: Height: 5\' 9"  (175.3 cm) Weight: 133.6 kg (294 lb 8.6 oz) IBW/kg (Calculated) : 70.7   heparin dosing weight: 102 kg  Vital Signs: Temp: 98.3 F (36.8 C) (06/08 1130) Temp Source: Oral (06/08 1130) BP: 132/99 (06/08 1130) Pulse Rate: 98 (06/08 1130)  Labs: Recent Labs    07/01/19 1343 07/01/19 1343 07/02/19 0358 07/02/19 0358 07/03/19 0219 07/03/19 1225 07/03/19 1630 07/04/19 0721 07/04/19 1046  HGB 7.7*   < > 7.8*   < > 8.6*  --   --  8.5*  --   HCT 25.5*   < > 25.8*  --  28.9*  --   --  28.4*  --   PLT 289   < > 256  --  303  --   --  329  --   LABPROT  --   --  21.4*  --  17.3*  --   --  16.4*  --   INR  --   --  1.9*  --  1.5*  --   --  1.4*  --   HEPARINUNFRC  --   --   --   --   --   --  <0.10* 0.12* 0.15*  CREATININE 8.64*  --  5.85*  --   --  5.27*  --   --   --    < > = values in this interval not displayed.    Estimated Creatinine Clearance: 24.5 mL/min (A) (by C-G formula based on SCr of 5.27 mg/dL (H)).   Medications:  Scheduled:  . amLODipine  5 mg Oral Daily  . Chlorhexidine Gluconate Cloth  6 each Topical Q0600  . gentamicin cream  1 application Topical Daily  . insulin aspart  0-15 Units Subcutaneous TID WC  . insulin aspart  0-5 Units Subcutaneous QHS  . metoprolol tartrate  12.5 mg Oral BID  . warfarin  4 mg Oral ONCE-1600  . Warfarin - Pharmacist Dosing Inpatient   Does not apply q1600    Assessment: 26 yom on warfarin PTA for provoked LLE DVT on 06/04/2019. Plan at that time was to continue warfarin for 3 months. PTA dose per coumadin clinic note on 6/2 is 3 mg on Wednesday and Saturday, 2 mg all other days.    Pharmacy consulted 6/7 to dose IV heparin as bridge  while INR less than goal.   RN reported early AM 6/8 that patient turned heparin off himself when he was washing up - off from about 0300-0410 until RN noticed.  Heparin resumed ~ 0410 AM.  The 7 hour heparin level = 0.15 on heparin drip 1550 units/hr. Level is subtherapeutic. INR 1.5>1.4 subtherapeutic after warfarin 2mg -3mg -4mg  given over last 3 days.  Hgb 8.5 stable, PLTC wnl stable. No bleeding reported and no further interruptions with heparin infusion per RN report.  No significant drug interactions noted.   Goal of Therapy:  Heparin level 0.3-0.7 INR 2-3 Monitor platelets by anticoagulation protocol: Yes   Plan:  Increase heparin to 1800 units / hr ( Hemodialysis RN will make rate change as pt is in dialysis now).  Check heparin level in 6 hrs after gtt increased Give Warfarin 5 mg today x1 Daily  heparin level, INR and CBC. Will stop heparin once INR > 2  Thank you Nicole Cella, RPh Clinical Pharmacist [phone] Please check AMION for all [place] phone numbers After 10:00 PM, call [place] [phone]  07/04/2019 1:00 PM

## 2019-07-04 NOTE — Progress Notes (Signed)
Bipap is PRN order, no distress noted, according to RN patient has been sating in mid to upper 90s on RA.  Will continue to monitor.

## 2019-07-04 NOTE — Progress Notes (Signed)
PROGRESS NOTE    Max Pittman.  AOZ:[id number] DOB: 1975/11/17 DOA: 06/30/2019 PCP: Chesley Noon, MD    Brief Narrative:  44 y.o. male with medical history significant of hypertension, diabetes mellitus, ESRD on peritoneal dialysis and DVT on warfarin presented to ED for evaluation of respiratory distress.  Patient states that shortness of breath started suddenly while resting at home and continue to worsen.  Patient also reported increased swelling of bilateral lower extremities but denies fever, cough and chest pain.  Patient states that he is doing his peritoneal dialysis regularly but he is feeling that his body is retaining too much fluids that is why he is having abdominal distention and bilateral lower extremity edema.  Patient otherwise denies fever, chills, sore throat, dizziness chest pain,, nausea, vomiting, diarrhea, constipation and abdominal pain.  Patient states that he is still making some urine.  EMS found the patient really hypoxic with oxygen saturation in 70s and started him on oxygen supplementation with nasal cannula.  ED Course: On arrival to the ED patient had blood pressure of 153/111, heart rate 104, respiratory rate 19 and oxygen saturation 99 percent on  oxygen with nasal cannula while oxygen supplementation rate is not mentioned.  Blood work showed WBC 17.5, hemoglobin 9.2, sodium 138, potassium 2.9, BUN 29, creatinine 8.37, blood glucose 241 and BNP 866.  Covid test negative.  Chest x-ray was positive for acute pulmonary edema without any infiltrates or consolidation.  EKG showed sinus tachycardia with nonspecific T wave changes.  Patient was started on nitroglycerin drip for hypertensive emergency and also placed on BiPAP by the ED physician and later on weaned off BiPAP and started on 6 L of oxygen with nasal cannula.  ED physician also spoke with on-call nephrologist/Dr. Carolin Sicks who felt that should be admitted to [facility] and he will be assessed by  nephrology for dialysis.  Assessment & Plan:   Principal Problem:   Acute pulmonary edema (HCC) Active Problems:   ESRD (end stage renal disease) ([place])   Pulmonary edema   Hypertensive emergency   Respiratory failure (HCC)  Active Problems: Hypertensive emergency -Pt presented with markedly elevated BP at presentation with clinical volume overload -Nephrology consulted and is following -Nitro gtt weaned off and pt was started on metoprolol at time of presentation -See RRT note. While on HD, pt had syncopal event associated with bradycardia, but while maintaining BP -EKG reviewed. Sinus -have decreased metoprolol dose to 12.5mg  and added norvasc. Cont to titrate bp meds accordingly -BP improved with dialysis, cont HD per nephrology  Acute hypoxic respiratory failure secondary to pulmonary edema -Currently remains O2 dependent, now on 5LNC -Cont to correct volume status via HD per Nephrology and cont to wean o2 as tolerated, down to room air this AM    ESRD (end stage renal disease) ([place]) Patient has a history of ESRD and on peritoneal dialysis, presented with gross volume overload secondary to renal impairment -Nephrology following, was transferred from [facility] to [facility] to undergo HD as tolerated. -Pt desires to continue with PD when ultimately discharged -Contininue with HD as per nephrology   Leukocytosis -Patient has no signs or symptoms of any infection.   -Leukocytosis is most likely reactive.   -had normalized  Hyperglycemia in the setting of diabetes mellitus -Patient most probably has poorly controlled diabetes and the blood glucose was 241 on arrival to the hospital. -continued on SSI coverage as needed  History of DVT -Patient is on warfarin.  Continue home warfarin  as tolerated per pharmacy dosing -INR subtherapeutic, thus on heparin bridge  Syncope -Noted recently while on HD -Event noted to be transient and resolved quickly -continue on decreased beta blocker  dose with norvasc for BP control  DVT prophylaxis: Coumadin with heparin bridge Code Status: Full Family Communication: Pt in room, family not at bedside  Status is: Inpatient  Remains inpatient appropriate because:Persistent severe electrolyte disturbances, Ongoing diagnostic testing needed not appropriate for outpatient work up and Inpatient level of care appropriate due to severity of illness   Dispo: The patient is from: Home              Anticipated d/c is to: Home              Anticipated d/c date is: 3 days              Patient currently is not medically stable to d/c.   Consultants:   Nephrology  Procedures:     Antimicrobials: Anti-infectives (From admission, onward)   None      Subjective: Reports feeling better today, still swollen  Objective: Vitals:   07/04/19 0329 07/04/19 0500 07/04/19 0852 07/04/19 1130  BP: (!) 147/84  (!) 146/101 (!) 132/99  Pulse: 100  (!) 102 98  Resp: (!) 23  (!) 25 19  Temp: 97.9 F (36.6 C)  97.9 F (36.6 C) 98.3 F (36.8 C)  TempSrc: Oral  Oral Oral  SpO2: 97%  96% 94%  Weight:  133.6 kg    Height:        Intake/Output Summary (Last 24 hours) at 07/04/2019 1448 Last data filed at 07/04/2019 0600 Gross per 24 hour  Intake 722.06 ml  Output 3701 ml  Net -2978.94 ml   Filed Weights   07/03/19 1135 07/03/19 1500 07/04/19 0500  Weight: (!) 136.8 kg 132.5 kg 133.6 kg    Examination: General exam: Awake, laying in bed, in nad Respiratory system: Normal respiratory effort, no wheezing Cardiovascular system: regular rate, s1, s2 Gastrointestinal system: Soft, nondistended, positive BS Central nervous system: CN2-12 grossly intact, strength intact Extremities: Perfused, no clubbing, BLE edema Skin: Normal skin turgor, no notable skin lesions seen Psychiatry: Mood normal // no visual hallucinations   Data Reviewed: I have personally reviewed following labs and imaging studies  CBC: Recent Labs  Lab 06/30/19 2240  06/30/19 2240 07/01/19 0506 07/01/19 1343 07/02/19 0358 07/03/19 0219 07/04/19 0721  WBC 17.5*   < > 9.5 13.3* 8.6 9.7 8.7  NEUTROABS 7.8*  --   --   --   --   --   --   HGB 9.2*   < > 7.5* 7.7* 7.8* 8.6* 8.5*  HCT 31.6*   < > 25.5* 25.5* 25.8* 28.9* 28.4*  MCV 99.1   < > 97.3 95.9 94.9 94.8 95.3  PLT 401*   < > 291 289 256 303 329   < > = values in this interval not displayed.   Basic Metabolic Panel: Recent Labs  Lab 07/01/19 0506 07/01/19 1343 07/02/19 0358 07/03/19 1225 07/04/19 1344  NA 139 139 139 136 137  K 2.9* 3.0* 3.1* 3.3* 2.9*  CL 99 98 103 101 101  CO2 28 25 27 25 26   GLUCOSE 119* 122* 130* 131* 116*  BUN 31* 28* 16 14 10   CREATININE 8.29* 8.64* 5.85* 5.27* 4.67*  CALCIUM 8.6* 8.5* 8.1* 8.3* 8.6*  MG  --   --  1.5*  --   --   PHOS  --  3.7 2.3* 2.4* 2.3*   GFR: Estimated Creatinine Clearance: 27.7 mL/min (A) (by C-G formula based on SCr of 4.67 mg/dL (H)). Liver Function Tests: Recent Labs  Lab 06/30/19 2240 07/01/19 1343 07/02/19 0358 07/03/19 1225 07/04/19 1344  AST 18  --   --   --   --   ALT 14  --   --   --   --   ALKPHOS 100  --   --   --   --   BILITOT 1.3*  --   --   --   --   PROT 7.2  --   --   --   --   ALBUMIN 2.4* 2.0* 1.8* 2.0* 2.0*   No results for input(s): LIPASE, AMYLASE in the last 168 hours. No results for input(s): AMMONIA in the last 168 hours. Coagulation Profile: Recent Labs  Lab 06/30/19 2240 07/01/19 0506 07/02/19 0358 07/03/19 0219 07/04/19 0721  INR 1.9* 2.2* 1.9* 1.5* 1.4*   Cardiac Enzymes: No results for input(s): CKTOTAL, CKMB, CKMBINDEX, TROPONINI in the last 168 hours. BNP (last 3 results) No results for input(s): PROBNP in the last 8760 hours. HbA1C: No results for input(s): HGBA1C in the last 72 hours. CBG: Recent Labs  Lab 07/03/19 0643 07/03/19 1521 07/03/19 2121 07/04/19 0612 07/04/19 1129  GLUCAP 130* 107* 141* 112* 133*   Lipid Profile: No results for input(s): CHOL, HDL, LDLCALC,  TRIG, CHOLHDL, LDLDIRECT in the last 72 hours. Thyroid Function Tests: No results for input(s): TSH, T4TOTAL, FREET4, T3FREE, THYROIDAB in the last 72 hours. Anemia Panel: Recent Labs    07/02/19 0358  FERRITIN 538*  TIBC 174*  IRON 24*   Sepsis Labs: No results for input(s): PROCALCITON, LATICACIDVEN in the last 168 hours.  Recent Results (from the past 240 hour(s))  SARS Coronavirus 2 by RT PCR (hospital order, performed in [facility] hospital lab) Nasopharyngeal Nasopharyngeal Swab     Status: None   Collection Time: 06/30/19 11:08 PM   Specimen: Nasopharyngeal Swab  Result Value Ref Range Status   SARS Coronavirus 2 NEGATIVE NEGATIVE Final    Comment: (NOTE) SARS-CoV-2 target nucleic acids are NOT DETECTED. The SARS-CoV-2 RNA is generally detectable in upper and lower respiratory specimens during the acute phase of infection. The lowest concentration of SARS-CoV-2 viral copies this assay can detect is 250 copies / mL. A negative result does not preclude SARS-CoV-2 infection and should not be used as the sole basis for treatment or other patient management decisions.  A negative result may occur with improper specimen collection / handling, submission of specimen other than nasopharyngeal swab, presence of viral mutation(s) within the areas targeted by this assay, and inadequate number of viral copies (<250 copies / mL). A negative result must be combined with clinical observations, patient history, and epidemiological information. Fact Sheet for Patients:   StrictlyIdeas.no Fact Sheet for Healthcare Providers: BankingDealers.co.za This test is not yet approved or cleared  by the [place] FDA and has been authorized for detection and/or diagnosis of SARS-CoV-2 by FDA under an Emergency Use Authorization (EUA).  This EUA will remain in effect (meaning this test can be used) for the duration of the COVID-19 declaration  under Section 564(b)(1) of the Act, 21 U.S.C. section 360bbb-3(b)(1), unless the authorization is terminated or revoked sooner. Performed at [facility], [address], [place], [place] [address]      Radiology Studies: No results found.  Scheduled Meds: . amLODipine  5 mg Oral Daily  .  Chlorhexidine Gluconate Cloth  6 each Topical Q0600  . gentamicin cream  1 application Topical Daily  . insulin aspart  0-15 Units Subcutaneous TID WC  . insulin aspart  0-5 Units Subcutaneous QHS  . metoprolol tartrate  12.5 mg Oral BID  . warfarin  5 mg Oral ONCE-1600  . Warfarin - Pharmacist Dosing Inpatient   Does not apply q1600   Continuous Infusions: . dialysis solution 2.5% low-MG/low-CA    . dialysis solution 4.25% low-MG/low-CA    . ferric gluconate (FERRLECIT/NULECIT) IV 250 mg (07/04/19 1358)  . heparin 1,550 Units/hr (07/04/19 0600)     LOS: 3 days   Marylu Lund, MD [facility] Pager On Amion  If 7PM-7AM, please contact night-coverage 07/04/2019, 2:48 PM

## 2019-07-04 NOTE — Progress Notes (Signed)
Patient ID: Max Pittman., male   DOB: [birth year], 44 y.o.   MRN: [id number]  [facility] Progress Note   Assessment/ Plan:   1.  Volume overload/acute hypoxic respiratory failure: Ongoing ultrafiltration with peritoneal dialysis after he has been predominantly hypotonic peritoneal dialysate as an outpatient resulting in current volume status. Continue daily hemodialysis while here in the hospital for aggressive volume unloading as PD UF appears to be limited in his case. 2. ESRD: On peritoneal dialysis with an indwelling right IJ TDC. Ongoing hemodialysis at this time for aggressive volume unloading prior to resuming PD. 3. Anemia of ESRD/chronic kidney disease: Ongoing intravenous iron, on ESA.  No indication for PRBC transfusion. 4. CKD-MBD: Continue renal diet, not on phosphorus binders.  Calcium level acceptable. 5. Nutrition: Continue nutritional supplementation with ongoing renal diet. 6. Hypertension: Blood pressures remain elevated, monitor with ultrafiltration/dialysis.  Subjective:   Reports that he is feeling better and offers explanation for why he signed off of dialysis early yesterday. 400 cc UF overnight.   Objective:   BP (!) 147/84 (BP Location: Right Arm)   Pulse 100   Temp 97.9 F (36.6 C) (Oral)   Resp (!) 23   Ht 5\' 9"  (1.753 m)   Wt 133.6 kg   SpO2 97%   BMI 43.50 kg/m   Physical Exam: Gen: Obese man, sitting comfortably, has been ambulatory. CVS: Pulse regular rhythm, normal rate, S1 and S2 normal Resp: Fine rales over both bases with some intermittent expiratory rhonchi. Abd: Soft, obese, nontender, edema over abdominal wall noted. Left periumbilical PD catheter. Ext: 2+ bilateral lower extremity edema  Labs: BMET Recent Labs  Lab 06/30/19 2240 07/01/19 0506 07/01/19 1343 07/02/19 0358 07/03/19 1225  NA 138 139 139 139 136  K 2.9* 2.9* 3.0* 3.1* 3.3*  CL 98 99 98 103 101  CO2 25 28 25 27 25   GLUCOSE 241* 119* 122* 130* 131*   BUN 29* 31* 28* 16 14  CREATININE 8.37* 8.29* 8.64* 5.85* 5.27*  CALCIUM 8.9 8.6* 8.5* 8.1* 8.3*  PHOS  --   --  3.7 2.3* 2.4*   CBC Recent Labs  Lab 06/30/19 2240 06/30/19 2240 07/01/19 0506 07/01/19 1343 07/02/19 0358 07/03/19 0219  WBC 17.5*   < > 9.5 13.3* 8.6 9.7  NEUTROABS 7.8*  --   --   --   --   --   HGB 9.2*   < > 7.5* 7.7* 7.8* 8.6*  HCT 31.6*   < > 25.5* 25.5* 25.8* 28.9*  MCV 99.1   < > 97.3 95.9 94.9 94.8  PLT 401*   < > 291 289 256 303   < > = values in this interval not displayed.     Medications:    . amLODipine  5 mg Oral Daily  . Chlorhexidine Gluconate Cloth  6 each Topical Q0600  . gentamicin cream  1 application Topical Daily  . insulin aspart  0-15 Units Subcutaneous TID WC  . insulin aspart  0-5 Units Subcutaneous QHS  . metoprolol tartrate  12.5 mg Oral BID  . Warfarin - Pharmacist Dosing Inpatient   Does not apply q1600   Elmarie Shiley, MD 07/04/2019, 7:43 AM

## 2019-07-04 NOTE — Progress Notes (Addendum)
During rounds, patient noted with heparin infusion turned off.  Patient stated that it was done so he turned it off.  Patient stated sometime after he returned from HD, it started beeping so he turned it off.  Patient aware of heparin importance and asked to notifiy the nurse for any irregularies or problems.  Heparin restarted.   Noted no rate change in computer as recommended while patient was in HD.  Heparin started at 18

## 2019-07-04 NOTE — Progress Notes (Signed)
Pt disconnected heparin gtt without informing RN @0303  to wash-up (pt oriented and essentially independent). Gtt restarted at 0407 during rounds. Pharmacy informed to adjust labs and dosing as necessary. Pt counseled on importance of gtt and verbalized understanding. Will continue to monitor.

## 2019-07-05 LAB — RENAL FUNCTION PANEL
Albumin: 2.2 g/dL — ABNORMAL LOW (ref 3.5–5.0)
Anion gap: 12 (ref 5–15)
BUN: 6 mg/dL (ref 6–20)
CO2: 26 mmol/L (ref 22–32)
Calcium: 8.6 mg/dL — ABNORMAL LOW (ref 8.9–10.3)
Chloride: 100 mmol/L (ref 98–111)
Creatinine, Ser: 4.49 mg/dL — ABNORMAL HIGH (ref 0.61–1.24)
GFR calc Af Amer: 17 mL/min — ABNORMAL LOW (ref >60)
GFR calc non Af Amer: 15 mL/min — ABNORMAL LOW (ref >60)
Glucose, Bld: 95 mg/dL (ref 70–99)
Phosphorus: 2.6 mg/dL (ref 2.5–4.6)
Potassium: 3.1 mmol/L — ABNORMAL LOW (ref 3.5–5.1)
Sodium: 138 mmol/L (ref 135–145)

## 2019-07-05 LAB — HEPARIN LEVEL (UNFRACTIONATED)
Heparin Unfractionated: 0.34 IU/mL (ref 0.30–0.70)
Heparin Unfractionated: <0.1 IU/mL — ABNORMAL LOW (ref 0.30–0.70)
Heparin Unfractionated: 0.28 IU/mL — ABNORMAL LOW (ref 0.30–0.70)

## 2019-07-05 LAB — CBC
HCT: 27.4 % — ABNORMAL LOW (ref 39.0–52.0)
Hemoglobin: 8.3 g/dL — ABNORMAL LOW (ref 13.0–17.0)
MCH: 28.3 pg (ref 26.0–34.0)
MCHC: 30.3 g/dL (ref 30.0–36.0)
MCV: 93.5 fL (ref 80.0–100.0)
Platelets: 283 10*3/uL (ref 150–400)
RBC: 2.93 MIL/uL — ABNORMAL LOW (ref 4.22–5.81)
RDW: 15.3 % (ref 11.5–15.5)
WBC: 9.3 10*3/uL (ref 4.0–10.5)
nRBC: 0.3 % — ABNORMAL HIGH (ref 0.0–0.2)

## 2019-07-05 LAB — GLUCOSE, CAPILLARY
Glucose-Capillary: 121 mg/dL — ABNORMAL HIGH (ref 70–99)
Glucose-Capillary: 97 mg/dL (ref 70–99)
Glucose-Capillary: 104 mg/dL — ABNORMAL HIGH (ref 70–99)
Glucose-Capillary: 127 mg/dL — ABNORMAL HIGH (ref 70–99)

## 2019-07-05 LAB — PROTIME-INR
INR: 1.6 — ABNORMAL HIGH (ref 0.8–1.2)
Prothrombin Time: 18.4 seconds — ABNORMAL HIGH (ref 11.4–15.2)

## 2019-07-05 MED ORDER — WARFARIN SODIUM 5 MG PO TABS
5.0000 mg | ORAL_TABLET | Freq: Once | ORAL | Status: AC
  Administered 2019-07-05: 5 mg via ORAL
  Filled 2019-07-05: qty 1

## 2019-07-05 MED ORDER — HEPARIN SODIUM (PORCINE) 1000 UNIT/ML IJ SOLN
INTRAMUSCULAR | Status: AC
  Administered 2019-07-05: 3200 [IU]
  Filled 2019-07-05: qty 4

## 2019-07-05 MED ORDER — HEPARIN BOLUS VIA INFUSION
2000.0000 [IU] | Freq: Once | INTRAVENOUS | Status: DC
  Filled 2019-07-05: qty 2000

## 2019-07-05 NOTE — Progress Notes (Signed)
Patient ID: Max Chute., male   DOB: Jul 06, 1975, 44 y.o.   MRN: [id number] [facility] Progress Note   Assessment/ Plan:   1.  Volume overload/acute hypoxic respiratory failure: Likely secondary to predominantly using hypotonic peritoneal dialysate as an outpatient resulting in current volume status.  He has had daily hemodialysis for the last 2 days and will have hemodialysis with ultrafiltration again today (net negative balance this hospital stay is 12 L with corresponding weight loss). 2. ESRD: On peritoneal dialysis with an indwelling right IJ TDC.  Undertaking hemodialysis for more robust ultrafiltration and will resume peritoneal dialysis today night with the anticipation of him going home to continue CCPD as an outpatient.  He will need to be evaluated relatively soon by his outpatient nephrologist to determine whether he needs transient hemodialysis to supplement his ultrafiltration/obtain estimated dry weight. 3. Anemia of ESRD/chronic kidney disease: Ongoing intravenous iron, on ESA.  No indication for PRBC transfusion. 4. CKD-MBD: Continue renal diet, not on phosphorus binders.  Calcium level acceptable. 5. Nutrition: Continue nutritional supplementation with ongoing renal diet. 6. Hypertension: Blood pressures remain elevated, monitor with ultrafiltration/dialysis.  Subjective:   Continues to report clinical improvement with ultrafiltration; again signed off of dialysis early yesterday because he needed to have a bowel movement.   Objective:   BP 137/82 (BP Location: Right Arm)   Pulse 98   Temp 98.5 F (36.9 C) (Oral)   Resp 13   Ht 5\' 9"  (1.753 m)   Wt 129.8 kg   SpO2 96%   BMI 42.26 kg/m   Physical Exam: Gen: Obese man, comfortably sitting on the edge of his bed CVS: Pulse regular rhythm, normal rate, S1 and S2 normal Resp: Fine rales over both bases with some intermittent expiratory rhonchi. Abd: Soft, obese, nontender, edema over abdominal wall  noted. Left periumbilical PD catheter. Ext: 2+ bilateral lower extremity edema  Labs: BMET Recent Labs  Lab 06/30/19 2240 07/01/19 0506 07/01/19 1343 07/02/19 0358 07/03/19 1225 07/04/19 1344  NA 138 139 139 139 136 137  K 2.9* 2.9* 3.0* 3.1* 3.3* 2.9*  CL 98 99 98 103 101 101  CO2 25 28 25 27 25 26   GLUCOSE 241* 119* 122* 130* 131* 116*  BUN 29* 31* 28* 16 14 10   CREATININE 8.37* 8.29* 8.64* 5.85* 5.27* 4.67*  CALCIUM 8.9 8.6* 8.5* 8.1* 8.3* 8.6*  PHOS  --   --  3.7 2.3* 2.4* 2.3*   CBC Recent Labs  Lab 06/30/19 2240 07/01/19 0506 07/02/19 0358 07/03/19 0219 07/04/19 0721 07/05/19 0333  WBC 17.5*   < > 8.6 9.7 8.7 9.3  NEUTROABS 7.8*  --   --   --   --   --   HGB 9.2*   < > 7.8* 8.6* 8.5* 8.3*  HCT 31.6*   < > 25.8* 28.9* 28.4* 27.4*  MCV 99.1   < > 94.9 94.8 95.3 93.5  PLT 401*   < > 256 303 329 283   < > = values in this interval not displayed.     Medications:    . amLODipine  5 mg Oral Daily  . Chlorhexidine Gluconate Cloth  6 each Topical Q0600  . gentamicin cream  1 application Topical Daily  . insulin aspart  0-15 Units Subcutaneous TID WC  . insulin aspart  0-5 Units Subcutaneous QHS  . metoprolol tartrate  12.5 mg Oral BID  . Warfarin - Pharmacist Dosing Inpatient   Does not apply [id number]  Elmarie Shiley, MD 07/05/2019, 9:43 AM

## 2019-07-05 NOTE — Procedures (Signed)
Patient seen on Hemodialysis. BP (!) 159/96 (BP Location: Right Arm)   Pulse 75   Temp 98 F (36.7 C) (Oral)   Resp 19   Ht 5\' 9"  (1.753 m)   Wt 129.8 kg   SpO2 96%   BMI 42.26 kg/m   QB 400, UF goal 5L Tolerating treatment without complaints at this time.   Elmarie Shiley MD [facility]. Office # [phone] Pager # [phone] 2:03 PM

## 2019-07-05 NOTE — Progress Notes (Signed)
PROGRESS NOTE    Max Pittman.  SWF:[id number] DOB: July 24, 1975 DOA: 06/30/2019 PCP: Chesley Noon, MD    Brief Narrative:  44 y.o. male with medical history significant of hypertension, diabetes mellitus, ESRD on peritoneal dialysis and DVT on warfarin presented to ED for evaluation of respiratory distress. Patient also reported increased swelling of bilateral lower extremities but denies fever, cough and chest pain. Patient states that he is still making some urine.  EMS found the patient really hypoxic with oxygen saturation in 70s and started him on oxygen supplementation with nasal cannula. On arrival to the ED patient had blood pressure of 153/111, heart rate 104, respiratory rate 19 and oxygen saturation 99 percent on  oxygen with nasal cannula. Blood work showed WBC 17.5, hemoglobin 9.2, sodium 138, potassium 2.9, BUN 29, creatinine 8.37, blood glucose 241 and BNP 866.  Covid test negative.  Chest x-ray was positive for acute pulmonary edema without any infiltrates or consolidation.  EKG showed sinus tachycardia with nonspecific T wave changes.  Patient was started on nitroglycerin drip for hypertensive emergency and also placed on BiPAP by the ED physician and later on weaned off BiPAP and started on 6 L of oxygen with nasal cannula.  ED physician also spoke with on-call nephrologist/Dr. Carolin Sicks who felt that should be admitted to [facility] and he will be assessed by nephrology for dialysis.  Assessment & Plan:   Principal Problem:   Acute pulmonary edema (HCC) Active Problems:   ESRD (end stage renal disease) ([place])   Pulmonary edema   Hypertensive emergency   Respiratory failure (HCC)   Hypertensive crisis BP fluctuating, although improving, no longer in crisis Continue metoprolol, Norvasc Nephrology on board  Acute hypoxic respiratory failure secondary to pulmonary edema Currently on RA Monitor closely  ESRD (end stage renal disease) Patient has a history of  ESRD and on peritoneal dialysis, presented with gross volume overload secondary to renal impairment Nephrology following, was transferred from [facility] to undergo HD as tolerated Pt desires to continue with PD when ultimately discharged Contininue with HD as per nephrology  Hypokalemia On HD, replace prn  Anemia of CKD Hemoglobin around baseline Daily CBC  Diabetes mellitus type 2 SSI, accu-checks, hypoglycemic protocol   History of DVT Patient is on warfarin Pharmacy to dose warfarin INR subtherapeutic, thus on heparin bridge  Syncope -Noted recently while on HD on 07/02/19 -Event noted to be transient and resolved quickly -continue on decreased beta blocker dose with norvasc for BP control  Morbid obesity Lifestyle modification advised    DVT prophylaxis: Coumadin with heparin bridge Code Status: Full Family Communication: Discussed extensively with patient  Status is: Inpatient  Remains inpatient appropriate because:Persistent severe electrolyte disturbances, Ongoing diagnostic testing needed not appropriate for outpatient work up and Inpatient level of care appropriate due to severity of illness   Dispo: The patient is from: Home              Anticipated d/c is to: Home              Anticipated d/c date is: 2 days              Patient currently is not medically stable to d/c.   Consultants:   Nephrology  Procedures:   None  Antimicrobials: Anti-infectives (From admission, onward)   None      Subjective: Patient denies any new complaints, denies any chest pain, abdominal pain, nausea/vomiting. Still with BLE edema  Objective: Vitals:  07/05/19 1600 07/05/19 1630 07/05/19 1700 07/05/19 1709  BP: (!) 132/95 (!) 150/91 137/72 (!) 147/100  Pulse: (!) 105 (!) 104 73 (!) 103  Resp:    (!) 24  Temp:    98.2 F (36.8 C)  TempSrc:    Oral  SpO2:    98%  Weight:    126.2 kg  Height:        Intake/Output Summary (Last 24 hours) at 07/05/2019  1813 Last data filed at 07/05/2019 1709 Gross per 24 hour  Intake 711.18 ml  Output 3700 ml  Net -2988.82 ml   Filed Weights   07/05/19 0430 07/05/19 1353 07/05/19 1709  Weight: 129.8 kg 130.1 kg 126.2 kg    Examination:  General: NAD   Cardiovascular: S1, S2 present  Respiratory: CTAB  Abdomen: Soft, nontender, nondistended, bowel sounds present  Musculoskeletal: 2+ bilateral pedal edema noted  Skin: Normal  Psychiatry: Normal mood   Data Reviewed: I have personally reviewed following labs and imaging studies  CBC: Recent Labs  Lab 06/30/19 2240 07/01/19 0506 07/01/19 1343 07/02/19 0358 07/03/19 0219 07/04/19 0721 07/05/19 0333  WBC 17.5*   < > 13.3* 8.6 9.7 8.7 9.3  NEUTROABS 7.8*  --   --   --   --   --   --   HGB 9.2*   < > 7.7* 7.8* 8.6* 8.5* 8.3*  HCT 31.6*   < > 25.5* 25.8* 28.9* 28.4* 27.4*  MCV 99.1   < > 95.9 94.9 94.8 95.3 93.5  PLT 401*   < > 289 256 303 329 283   < > = values in this interval not displayed.   Basic Metabolic Panel: Recent Labs  Lab 07/01/19 1343 07/02/19 0358 07/03/19 1225 07/04/19 1344 07/05/19 0930  NA 139 139 136 137 138  K 3.0* 3.1* 3.3* 2.9* 3.1*  CL 98 103 101 101 100  CO2 25 27 25 26 26   GLUCOSE 122* 130* 131* 116* 95  BUN 28* 16 14 10 6   CREATININE 8.64* 5.85* 5.27* 4.67* 4.49*  CALCIUM 8.5* 8.1* 8.3* 8.6* 8.6*  MG  --  1.5*  --   --   --   PHOS 3.7 2.3* 2.4* 2.3* 2.6   GFR: Estimated Creatinine Clearance: 27.9 mL/min (A) (by C-G formula based on SCr of 4.49 mg/dL (H)). Liver Function Tests: Recent Labs  Lab 06/30/19 2240 06/30/19 2240 07/01/19 1343 07/02/19 0358 07/03/19 1225 07/04/19 1344 07/05/19 0930  AST 18  --   --   --   --   --   --   ALT 14  --   --   --   --   --   --   ALKPHOS 100  --   --   --   --   --   --   BILITOT 1.3*  --   --   --   --   --   --   PROT 7.2  --   --   --   --   --   --   ALBUMIN 2.4*   < > 2.0* 1.8* 2.0* 2.0* 2.2*   < > = values in this interval not displayed.    No results for input(s): LIPASE, AMYLASE in the last 168 hours. No results for input(s): AMMONIA in the last 168 hours. Coagulation Profile: Recent Labs  Lab 07/01/19 0506 07/02/19 0358 07/03/19 0219 07/04/19 0721 07/05/19 0333  INR 2.2* 1.9* 1.5* 1.4* 1.6*   Cardiac Enzymes:  No results for input(s): CKTOTAL, CKMB, CKMBINDEX, TROPONINI in the last 168 hours. BNP (last 3 results) No results for input(s): PROBNP in the last 8760 hours. HbA1C: No results for input(s): HGBA1C in the last 72 hours. CBG: Recent Labs  Lab 07/04/19 1704 07/04/19 2050 07/05/19 0657 07/05/19 1140 07/05/19 1751  GLUCAP 109* 123* 121* 97 104*   Lipid Profile: No results for input(s): CHOL, HDL, LDLCALC, TRIG, CHOLHDL, LDLDIRECT in the last 72 hours. Thyroid Function Tests: No results for input(s): TSH, T4TOTAL, FREET4, T3FREE, THYROIDAB in the last 72 hours. Anemia Panel: No results for input(s): VITAMINB12, FOLATE, FERRITIN, TIBC, IRON, RETICCTPCT in the last 72 hours. Sepsis Labs: No results for input(s): PROCALCITON, LATICACIDVEN in the last 168 hours.  Recent Results (from the past 240 hour(s))  SARS Coronavirus 2 by RT PCR (hospital order, performed in [facility] hospital lab) Nasopharyngeal Nasopharyngeal Swab     Status: None   Collection Time: 06/30/19 11:08 PM   Specimen: Nasopharyngeal Swab  Result Value Ref Range Status   SARS Coronavirus 2 NEGATIVE NEGATIVE Final    Comment: (NOTE) SARS-CoV-2 target nucleic acids are NOT DETECTED. The SARS-CoV-2 RNA is generally detectable in upper and lower respiratory specimens during the acute phase of infection. The lowest concentration of SARS-CoV-2 viral copies this assay can detect is 250 copies / mL. A negative result does not preclude SARS-CoV-2 infection and should not be used as the sole basis for treatment or other patient management decisions.  A negative result may occur with improper specimen collection / handling, submission of  specimen other than nasopharyngeal swab, presence of viral mutation(s) within the areas targeted by this assay, and inadequate number of viral copies (<250 copies / mL). A negative result must be combined with clinical observations, patient history, and epidemiological information. Fact Sheet for Patients:   StrictlyIdeas.no Fact Sheet for Healthcare Providers: BankingDealers.co.za This test is not yet approved or cleared  by the [place] FDA and has been authorized for detection and/or diagnosis of SARS-CoV-2 by FDA under an Emergency Use Authorization (EUA).  This EUA will remain in effect (meaning this test can be used) for the duration of the COVID-19 declaration under Section 564(b)(1) of the Act, 21 U.S.C. section 360bbb-3(b)(1), unless the authorization is terminated or revoked sooner. Performed at [facility], [address] [place], [place] [address]      Radiology Studies: No results found.  Scheduled Meds: . amLODipine  5 mg Oral Daily  . Chlorhexidine Gluconate Cloth  6 each Topical Q0600  . gentamicin cream  1 application Topical Daily  . heparin  2,000 Units Intravenous Once  . insulin aspart  0-15 Units Subcutaneous TID WC  . insulin aspart  0-5 Units Subcutaneous QHS  . metoprolol tartrate  12.5 mg Oral BID  . Warfarin - Pharmacist Dosing Inpatient   Does not apply q1600   Continuous Infusions: . dialysis solution 2.5% low-MG/low-CA    . dialysis solution 4.25% low-MG/low-CA    . heparin 1,800 Units/hr (07/05/19 0600)     LOS: 4 days   Alma Friendly, MD [facility] Pager On Amion  If 7PM-7AM, please contact night-coverage 07/05/2019, 6:13 PM

## 2019-07-05 NOTE — Progress Notes (Addendum)
ANTICOAGULATION CONSULT NOTE - Follow Up Consult  Pharmacy Consult for warfarin and IV heparin Indication:  DVT  Allergies  Allergen Reactions  . Doxycycline Hives  . Latex Swelling    Pt reports swelling at site.   . Morphine And Related Anxiety    Patient Measurements: Height: 5\' 9"  (175.3 cm) Weight: 129.8 kg (286 lb 3.2 oz) IBW/kg (Calculated) : 70.7   heparin dosing weight: 102 kg  Vital Signs: Temp: 97.8 F (36.6 C) (06/09 0430) Temp Source: Oral (06/09 0430) BP: 147/87 (06/09 0430) Pulse Rate: 96 (06/09 0036)  Labs: Recent Labs     0000 07/03/19 0219 07/03/19 1225 07/03/19 1630 07/04/19 0721 07/04/19 0721 07/04/19 1046 07/04/19 1344 07/04/19 2009 07/05/19 0333  HGB   < > 8.6*  --   --  8.5*  --   --   --   --  8.3*  HCT  --  28.9*  --   --  28.4*  --   --   --   --  27.4*  PLT  --  303  --   --  329  --   --   --   --  283  LABPROT  --  17.3*  --   --  16.4*  --   --   --   --  18.4*  INR  --  1.5*  --   --  1.4*  --   --   --   --  1.6*  HEPARINUNFRC  --   --   --    < > 0.12*   < > 0.15*  --  0.26* 0.34  CREATININE  --   --  5.27*  --   --   --   --  4.67*  --   --    < > = values in this interval not displayed.    Estimated Creatinine Clearance: 27.2 mL/min (A) (by C-G formula based on SCr of 4.67 mg/dL (H)).   Assessment: 76 yom on warfarin PTA for provoked LLE DVT on 06/04/2019. Plan at that time was to continue warfarin for 3 months. PTA dose per coumadin clinic note on 6/2 is 3 mg on Wednesday and Saturday, 2 mg all other days. He is also on a heparin bridge. -INR= 1.6, hg= 8.3   Goal of Therapy:  Heparin level 0.3-0.7 units/ml INR 2-3 Monitor platelets by anticoagulation protocol: Yes   Plan:  -Coumadin 5mg  po today -Daily PT/INR  Hildred Laser, PharmD Clinical Pharmacist **Pharmacist phone directory can now be found on [email or web address] ([email or web address]).  Listed under [place].  Addendum -heparin level undetectable -Per RN the patient clamped  the heparin drip  Plan -Heparin 2000 unit IV bolus -No change in heparin rate since he was previously at goal -Heparin level in 8 hours and daily wth CBC daily  Hildred Laser, PharmD Clinical Pharmacist **Pharmacist phone directory can now be found on [email or web address] ([email or web address]).  Listed under [place].

## 2019-07-05 NOTE — Progress Notes (Signed)
ANTICOAGULATION CONSULT NOTE - Follow Up Consult  Pharmacy Consult for warfarin and IV heparin Indication:  DVT  Allergies  Allergen Reactions  . Doxycycline Hives  . Latex Swelling    Pt reports swelling at site.   . Morphine And Related Anxiety    Patient Measurements: Height: 5\' 9"  (175.3 cm) Weight: 130.8 kg (288 lb 5.8 oz) IBW/kg (Calculated) : 70.7   heparin dosing weight: 102 kg  Vital Signs: Temp: 98.2 F (36.8 C) (06/09 0036) Temp Source: Oral (06/09 0036) BP: 141/81 (06/09 0036) Pulse Rate: 96 (06/09 0036)  Labs: Recent Labs     0000 07/03/19 0219 07/03/19 1225 07/03/19 1630 07/04/19 0721 07/04/19 0721 07/04/19 1046 07/04/19 1344 07/04/19 2009 07/05/19 0333  HGB   < > 8.6*  --   --  8.5*  --   --   --   --  8.3*  HCT  --  28.9*  --   --  28.4*  --   --   --   --  27.4*  PLT  --  303  --   --  329  --   --   --   --  283  LABPROT  --  17.3*  --   --  16.4*  --   --   --   --  18.4*  INR  --  1.5*  --   --  1.4*  --   --   --   --  1.6*  HEPARINUNFRC  --   --   --    < > 0.12*   < > 0.15*  --  0.26* 0.34  CREATININE  --   --  5.27*  --   --   --   --  4.67*  --   --    < > = values in this interval not displayed.    Estimated Creatinine Clearance: 27.3 mL/min (A) (by C-G formula based on SCr of 4.67 mg/dL (H)).   Assessment: 49 yom on warfarin PTA for provoked LLE DVT on 06/04/2019. Plan at that time was to continue warfarin for 3 months. PTA dose per coumadin clinic note on 6/2 is 3 mg on Wednesday and Saturday, 2 mg all other days.   Heparin level 0.34 (therapeutic) on gtt at 1800 units/hr. No issues with infusion overnight.  Goal of Therapy:  Heparin level 0.3-0.7 units/ml INR 2-3 Monitor platelets by anticoagulation protocol: Yes   Plan:  Continue heparin at 1800 units/hr Will f/u 6 hr heparin level to confirm therapeutic  Sherlon Handing, PharmD, BCPS Please see amion for complete clinical pharmacist phone list  07/05/2019 4:07 AM

## 2019-07-05 NOTE — Progress Notes (Signed)
Noted IV pump powered off.  Heparin drip clamped and IV clamped.  Patient stated "he unhooked" himself when he got cleaned up"  Patient again reminded to please ask nurse for assistance.

## 2019-07-05 NOTE — Progress Notes (Signed)
ANTICOAGULATION CONSULT NOTE - Follow Up Consult  Pharmacy Consult for warfarin and IV heparin Indication:  DVT  Allergies  Allergen Reactions  . Doxycycline Hives  . Latex Swelling    Pt reports swelling at site.   . Morphine And Related Anxiety    Patient Measurements: Height: 5\' 9"  (175.3 cm) Weight: 126.2 kg (278 lb 3.5 oz) IBW/kg (Calculated) : 70.7   heparin dosing weight: 102 kg  Vital Signs: Temp: 98.2 F (36.8 C) (06/09 1709) Temp Source: Oral (06/09 1709) BP: 147/100 (06/09 1709) Pulse Rate: 103 (06/09 1709)  Labs: Recent Labs     0000 07/03/19 0219 07/03/19 1225 07/03/19 1630 07/04/19 0721 07/04/19 1046 07/04/19 1344 07/04/19 2009 07/05/19 0333 07/05/19 0930 07/05/19 1955  HGB   < > 8.6*  --   --  8.5*  --   --   --  8.3*  --   --   HCT  --  28.9*  --   --  28.4*  --   --   --  27.4*  --   --   PLT  --  303  --   --  329  --   --   --  283  --   --   LABPROT  --  17.3*  --   --  16.4*  --   --   --  18.4*  --   --   INR  --  1.5*  --   --  1.4*  --   --   --  1.6*  --   --   HEPARINUNFRC  --   --   --    < > 0.12*   < >  --    < > 0.34 <0.10* 0.28*  CREATININE  --   --  5.27*  --   --   --  4.67*  --   --  4.49*  --    < > = values in this interval not displayed.    Estimated Creatinine Clearance: 27.9 mL/min (A) (by C-G formula based on SCr of 4.49 mg/dL (H)).   Assessment: 53 yom on warfarin PTA for provoked LLE DVT on 06/04/2019. Plan at that time was to continue warfarin for 3 months. PTA dose per coumadin clinic note on 6/2 is 3 mg on Wednesday and Saturday, 2 mg all other days. He is also on a heparin bridge. -INR= 1.6, hg= 8.3  PM heparin level 0.28, just slightly below goal.  Per RN, patient did turn off pump at some point tonight, but it appears it was right after heparin level was drawn.  Goal of Therapy:  Heparin level 0.3-0.7 units/ml INR 2-3 Monitor platelets by anticoagulation protocol: Yes   Plan:  -Increase IV heparin to 1900  units/hr -Recheck heparin level with AM labs. -Daily heparin level and CBC.  [place] Crane, Roylene Reason, BCCP Clinical Pharmacist  07/05/2019 9:17 PM   [facility] pharmacy phone numbers are listed on [email or web address]

## 2019-07-05 NOTE — Progress Notes (Signed)
Physical Therapy Treatment Patient Details Name: Max Pittman. MRN: [id number] DOB: 05/24/1975 Today's Date: 07/05/2019    History of Present Illness Patient is a 44 y/o male who presents with SOB and BLE swelling. CXR- acute pulmonary edema. Admitted with HTN emergency and Acute hypoxic respiratory failure secondary to pulmonary edema. PMH includes stroke, left hip dislocation s/p reduction in April 2021, ESRD on HD and/or peritoneal dialysis, DM, HTN.    PT Comments    Pt with improved ambulation tolerance and no longer requires O2 for mobility. Pt continues to have decreased L LE WBing tolerance and remains dependent on RW for safe mobility. Pt able to amb 100' with RW and 1 standing rest break. SpO2 >91% on RA, reports 3/4 DOE but recovered quickly upon standing. Pt at baseline is non-compliant and has decreased insight to his medical problems. Acute PT to cont to follow to progress indep and mobility.    Follow Up Recommendations  Home health PT;Supervision for mobility/OOB     Equipment Recommendations  None recommended by PT    Recommendations for Other Services       Precautions / Restrictions Precautions Precautions: Fall Precaution Booklet Issued: No Precaution Comments: watch 02 Restrictions Weight Bearing Restrictions: No    Mobility  Bed Mobility               General bed mobility comments: pt sitting EOB upon PT arrival  Transfers Overall transfer level: Needs assistance Equipment used: Rolling walker (2 wheeled) Transfers: Sit to/from Stand Sit to Stand: Min guard         General transfer comment: pushed up from bed, increased time  Ambulation/Gait Ambulation/Gait assistance: Min guard Gait Distance (Feet): 100 Feet Assistive device: Rolling walker (2 wheeled) Gait Pattern/deviations: Decreased stance time - left;Decreased step length - right;Step-through pattern Gait velocity: dec Gait velocity interpretation: <1.31 ft/sec, indicative  of household ambulator General Gait Details: slow, significant dependency on RW due to L LE pain. Pt with 3/4 DOE, SPO2 >90% on RA, pt with 1 standing rest break   Stairs             Wheelchair Mobility    Modified Rankin (Stroke Patients Only)       Balance Overall balance assessment: Needs assistance Sitting-balance support: Feet supported;No upper extremity supported Sitting balance-Leahy Scale: Good     Standing balance support: During functional activity Standing balance-Leahy Scale: Poor Standing balance comment: Requires UE support                            Cognition Arousal/Alertness: Awake/alert Behavior During Therapy: Flat affect Overall Cognitive Status: Within Functional Limits for tasks assessed                                 General Comments: pt with decreased insight to deficits and severity of health condition and remains non-compliant however this is baseline      Exercises      General Comments General comments (skin integrity, edema, etc.): HR stable, SOB with ambulation but recovered quickly upon sitting      Pertinent Vitals/Pain Pain Assessment: 0-10 Pain Score: 3  Pain Location: LLE Pain Descriptors / Indicators: Sore;Aching Pain Intervention(s): Monitored during session    Home Living                      Prior Function  PT Goals (current goals can now be found in the care plan section) Progress towards PT goals: Progressing toward goals    Frequency    Min 3X/week      PT Plan Current plan remains appropriate    Co-evaluation              AM-PAC PT "6 Clicks" Mobility   Outcome Measure  Help needed turning from your back to your side while in a flat bed without using bedrails?: None Help needed moving from lying on your back to sitting on the side of a flat bed without using bedrails?: None Help needed moving to and from a bed to a chair (including a  wheelchair)?: A Little Help needed standing up from a chair using your arms (e.g., wheelchair or bedside chair)?: A Little Help needed to walk in hospital room?: A Little Help needed climbing 3-5 steps with a railing? : A Lot 6 Click Score: 19    End of Session   Activity Tolerance: Patient tolerated treatment well Patient left: in bed;with call bell/phone within reach(sitting EOB) Nurse Communication: Mobility status PT Visit Diagnosis: Pain;Other (comment);Muscle weakness (generalized) (M62.81);Difficulty in walking, not elsewhere classified (R26.2) Pain - Right/Left: Left Pain - part of body: Leg     Time: 1140-1200 PT Time Calculation (min) (ACUTE ONLY): 20 min  Charges:  $Gait Training: 8-22 mins                     Kittie Plater, PT, DPT Acute Rehabilitation Services Pager #: [phone] Office #: [phone]    [place]line Lopes 07/05/2019, 1:00 PM

## 2019-07-05 NOTE — Care Management Important Message (Signed)
Important Message  Patient Details  Name: Max Pittman. MRN: [id number] Date of Birth: 11/04/1975   Medicare Important Message Given:  Yes     Shelda Altes 07/05/2019, 11:32 AM

## 2019-07-06 ENCOUNTER — Other Ambulatory Visit: Payer: Self-pay | Admitting: Orthopedic Surgery

## 2019-07-06 DIAGNOSIS — S32810A Multiple fractures of pelvis with stable disruption of pelvic ring, initial encounter for closed fracture: Secondary | ICD-10-CM

## 2019-07-06 DIAGNOSIS — J9601 Acute respiratory failure with hypoxia: Secondary | ICD-10-CM

## 2019-07-06 DIAGNOSIS — S32402A Unspecified fracture of left acetabulum, initial encounter for closed fracture: Secondary | ICD-10-CM

## 2019-07-06 DIAGNOSIS — S73005A Unspecified dislocation of left hip, initial encounter: Secondary | ICD-10-CM

## 2019-07-06 LAB — RENAL FUNCTION PANEL
Albumin: 2.2 g/dL — ABNORMAL LOW (ref 3.5–5.0)
Anion gap: 6 (ref 5–15)
BUN: 5 mg/dL — ABNORMAL LOW (ref 6–20)
CO2: 30 mmol/L (ref 22–32)
Calcium: 8.7 mg/dL — ABNORMAL LOW (ref 8.9–10.3)
Chloride: 103 mmol/L (ref 98–111)
Creatinine, Ser: 3.82 mg/dL — ABNORMAL HIGH (ref 0.61–1.24)
GFR calc Af Amer: 21 mL/min — ABNORMAL LOW (ref >60)
GFR calc non Af Amer: 18 mL/min — ABNORMAL LOW (ref >60)
Glucose, Bld: 159 mg/dL — ABNORMAL HIGH (ref 70–99)
Phosphorus: 2.9 mg/dL (ref 2.5–4.6)
Potassium: 2.7 mmol/L — CL (ref 3.5–5.1)
Sodium: 139 mmol/L (ref 135–145)

## 2019-07-06 LAB — CBC
HCT: 26.9 % — ABNORMAL LOW (ref 39.0–52.0)
Hemoglobin: 7.9 g/dL — ABNORMAL LOW (ref 13.0–17.0)
MCH: 28.2 pg (ref 26.0–34.0)
MCHC: 29.4 g/dL — ABNORMAL LOW (ref 30.0–36.0)
MCV: 96.1 fL (ref 80.0–100.0)
Platelets: 226 10*3/uL (ref 150–400)
RBC: 2.8 MIL/uL — ABNORMAL LOW (ref 4.22–5.81)
RDW: 15.5 % (ref 11.5–15.5)
WBC: 8.8 10*3/uL (ref 4.0–10.5)
nRBC: 0.2 % (ref 0.0–0.2)

## 2019-07-06 LAB — PROTIME-INR
INR: 2.1 — ABNORMAL HIGH (ref 0.8–1.2)
Prothrombin Time: 23.2 seconds — ABNORMAL HIGH (ref 11.4–15.2)

## 2019-07-06 LAB — GLUCOSE, CAPILLARY
Glucose-Capillary: 111 mg/dL — ABNORMAL HIGH (ref 70–99)
Glucose-Capillary: 124 mg/dL — ABNORMAL HIGH (ref 70–99)

## 2019-07-06 LAB — MAGNESIUM: Magnesium: 1.7 mg/dL (ref 1.7–2.4)

## 2019-07-06 LAB — HEPARIN LEVEL (UNFRACTIONATED): Heparin Unfractionated: >2.2 IU/mL — ABNORMAL HIGH (ref 0.30–0.70)

## 2019-07-06 MED ORDER — AMLODIPINE BESYLATE 5 MG PO TABS: 5.0000 mg | ORAL_TABLET | Freq: Every day | ORAL | 0 refills | Status: DC

## 2019-07-06 MED ORDER — POTASSIUM CHLORIDE 10 MEQ/100ML IV SOLN
10.0000 meq | INTRAVENOUS | Status: AC
  Administered 2019-07-06 (×3): 10 meq via INTRAVENOUS
  Filled 2019-07-06: qty 100

## 2019-07-06 MED ORDER — METOPROLOL TARTRATE 25 MG PO TABS: 12.5000 mg | ORAL_TABLET | Freq: Two times a day (BID) | ORAL | 0 refills | Status: DC

## 2019-07-06 MED ORDER — WARFARIN SODIUM 2 MG PO TABS: 2.0000 mg | ORAL_TABLET | Freq: Once | ORAL | Status: DC

## 2019-07-06 MED ORDER — POTASSIUM CHLORIDE CRYS ER 20 MEQ PO TBCR
40.0000 meq | EXTENDED_RELEASE_TABLET | Freq: Once | ORAL | Status: AC
  Administered 2019-07-06: 40 meq via ORAL
  Filled 2019-07-06: qty 2

## 2019-07-06 NOTE — TOC Transition Note (Signed)
Transition of Care [facility]) - CM/SW Discharge Note   Patient Details  Name: Max Pittman. MRN: [id number] Date of Birth: Jan 20, 1976  [facility]) CM/SW Contact:  Verdell Carmine, RN Phone Number: [phone] 07/06/2019, 1:40 PM   Clinical Narrative:    Patient discharging today, he states he has home health services, but we do not set these up, his MD ( nephrology ) office does. No listing of  Home Health services in [facility] system. He is unsure what they are called. They provide nursing for dialysis. He has equipment needed, and dialysis is getting all supplies together for PD.    Final next level of care: [place] Barriers to Discharge: No Barriers Identified   Patient Goals and CMS Choice Patient states their goals for this hospitalization and ongoing recovery are:: leave hospital and not get readmitted CMS Medicare.gov Compare Post Acute Care list provided to:: Patient (previous home health)    Discharge Placement  Home with Home health                     Discharge Plan and [place], Dialysis services                                 Social Determinants of Health (SDOH) Interventions     Readmission Risk Interventions No flowsheet data found.

## 2019-07-06 NOTE — Discharge Summary (Signed)
Discharge Summary  Max Pittman. PYK:[id number] DOB: Oct 28, 1975  PCP: Chesley Noon, MD  Admit date: 06/30/2019 Discharge date: 07/06/2019  Time spent: 40 mins  Recommendations for Outpatient Follow-up:  1. PCP in 1 week 2. Nephrology as scheduled    Discharge Diagnoses:  Active Hospital Problems   Diagnosis Date Noted  . Acute pulmonary edema ([place]) 07/01/2019  . Pulmonary edema 07/01/2019  . Hypertensive emergency 07/01/2019  . Respiratory failure ([place]) 07/01/2019  . ESRD (end stage renal disease) [facility])     Resolved Hospital Problems  No resolved problems to display.    Discharge Condition: Stable  Diet recommendation: Renal diet   Vitals:   07/06/19 0954 07/06/19 1158  BP:  121/89  Pulse: (!) 105 100  Resp:  20  Temp:  97.7 F (36.5 C)  SpO2:  97%    History of present illness:  44 y.o.malewith medical history significant ofhypertension, diabetes mellitus, ESRD on peritoneal dialysis and DVT on warfarin presented to ED for evaluation of respiratory distress. Patient also reported increased swelling of bilateral lower extremities but denies fever, cough and chest pain. Patient states that he is still making some urine. EMS found the patient really hypoxic with oxygen saturation in 70s and started him on oxygen supplementation with nasal cannula. On arrival to the ED patient had blood pressure of 153/111, heart rate 104, respiratory rate 19 and oxygen saturation 99percent on oxygen with nasal cannula. Blood work showed WBC 17.5, hemoglobin 9.2, sodium 138, potassium 2.9, BUN 29, creatinine 8.37, blood glucose 241 and BNP 866. Covid test negative. Chest x-ray was positive for acute pulmonary edema without any infiltrates or consolidation. EKG showed sinus tachycardia with nonspecific T wave changes. Patient was started on nitroglycerin drip for hypertensive emergency and also placed on BiPAP by the ED physician and later on weaned off BiPAP and started on  6 L of oxygen with nasal cannula. ED physician also spoke with on-call nephrologist/Dr. Carolin Sicks who felt that should be admitted to [facility] and he will be assessed by nephrology for dialysis.    Today, patient denies any new planes, denies any chest pain, shortness of breath, abdominal pain, nausea/vomiting, fever/chills.  Patient still with lower extremity edema.  Nephrology okay for discharge from this time point to follow-up with patient's nephrologist.  Adjustment made to patient's PD regimen, verbalized understanding.  Advised to be compliant with dialysis (pt continues to sign off HD before session is over), appointment and medications. Pt eager to be discharged    Hospital Course:  Principal Problem:   Acute pulmonary edema ([place]) Active Problems:   ESRD (end stage renal disease) ([place])   Pulmonary edema   Hypertensive emergency   Respiratory failure ([facility])   Hypertensive crisis Improved Continue metoprolol, Norvasc upon discharge  Acute hypoxic respiratory failure secondary to pulmonary edema Currently on RA  ESRD (end stage renal disease) Patient has a history of ESRD and on peritoneal dialysis, presented with gross volume overload secondary to renal impairment Nephrology following, was [facility] to undergo HD as tolerated Pt desires to continue with PD, nephrology made some adjustment to PD regimen, advised to be compliant and follow up closely with outpt nephrology  Hypokalemia Replaced prn PCP/nephrology to follow up  Anemia of CKD Hemoglobin around baseline Follow up with PCP/nephrology  Diabetes mellitus type 2 Continue home regimen   History of DVT Patient is on warfarin, INR therapeutic Continue warfarin, coumadin clinic  Syncope Noted recently while on HD  on 07/02/19 Event noted to be transient and resolved quickly Continue on decreased beta blocker dose with norvasc for BP control  Morbid obesity Lifestyle modification  advised          Malnutrition Type:      Malnutrition Characteristics:      Nutrition Interventions:      Estimated body mass index is 41.09 kg/m as calculated from the following:   Height as of this encounter: 5\' 9"  (1.753 m).   Weight as of this encounter: 126.2 kg.    Procedures:  None  Consultations:  Nephrology  Discharge Exam: BP 121/89 (BP Location: Right Arm)   Pulse 100   Temp 97.7 F (36.5 C) (Oral)   Resp 20   Ht 5\' 9"  (1.753 m)   Wt 126.2 kg   SpO2 97%   BMI 41.09 kg/m   General: NAD  Cardiovascular: S1, S2 present Respiratory:  Diminished BS b/l   Discharge Instructions You were cared for by a hospitalist during your hospital stay. If you have any questions about your discharge medications or the care you received while you were in the hospital after you are discharged, you can call the unit and asked to speak with the hospitalist on call if the hospitalist that took care of you is not available. Once you are discharged, your primary care physician will handle any further medical issues. Please note that NO REFILLS for any discharge medications will be authorized once you are discharged, as it is imperative that you return to your primary care physician (or establish a relationship with a primary care physician if you do not have one) for your aftercare needs so that they can reassess your need for medications and monitor your lab values.  Discharge Instructions    Diet - low sodium heart healthy   Complete by: As directed    Increase activity slowly   Complete by: As directed    No wound care   Complete by: As directed      Allergies as of 07/06/2019      Reactions   Doxycycline Hives   Latex Swelling   Pt reports swelling at site.   Morphine And Related Anxiety      Medication List    STOP taking these medications   lidocaine 5 % Commonly known as: LIDODERM   melatonin 5 MG Tabs   polyethylene glycol 17 g packet Commonly  known as: MIRALAX / GLYCOLAX     TAKE these medications   acetaminophen 325 MG tablet Commonly known as: TYLENOL Take 2 tablets (650 mg total) by mouth every 8 (eight) hours.   amLODipine 5 MG tablet Commonly known as: NORVASC Take 1 tablet (5 mg total) by mouth daily. Start taking on: July 07, 2019   aspirin 81 MG EC tablet Take 1 tablet (81 mg total) by mouth daily.   atorvastatin 40 MG tablet Commonly known as: LIPITOR Take 1 tablet (40 mg total) by mouth daily at 6 PM.   buPROPion 150 MG 12 hr tablet Commonly known as: WELLBUTRIN SR Take 1 tablet (150 mg total) by mouth 2 (two) times daily with a meal.   docusate sodium 100 MG capsule Commonly known as: COLACE Take 300 mg by mouth daily as needed for mild constipation or moderate constipation. As directed   HYDROcodone-acetaminophen 5-325 MG tablet Commonly known as: NORCO/VICODIN Take 1 tablet by mouth every 8 (eight) hours as needed.   loratadine 10 MG tablet Commonly known as: CLARITIN Take 1  tablet (10 mg total) by mouth daily.   methocarbamol 500 MG tablet Commonly known as: ROBAXIN Take 2 tablets (1,000 mg total) by mouth 3 (three) times daily.   metoprolol tartrate 25 MG tablet Commonly known as: LOPRESSOR Take 0.5 tablets (12.5 mg total) by mouth 2 (two) times daily.   omeprazole 20 MG capsule Commonly known as: PRILOSEC Take 1 capsule (20 mg total) by mouth daily.   oxyCODONE 10 mg 12 hr tablet Commonly known as: OXYCONTIN Take 1 tablet (10 mg total) by mouth daily.   pioglitazone 15 MG tablet Commonly known as: ACTOS Take 1 tablet (15 mg total) by mouth daily.   pregabalin 50 MG capsule Commonly known as: LYRICA Take 1 capsule (50 mg total) by mouth at bedtime.   sevelamer carbonate 800 MG tablet Commonly known as: RENVELA Take 3 tablets (2,400 mg total) by mouth 3 (three) times daily with meals.   Vitamin D3 25 MCG tablet Commonly known as: Vitamin D Take 2 tablets (2,000 Units total)  by mouth 2 (two) times daily.   warfarin 2 MG tablet Commonly known as: Coumadin Take as directed. If you are unsure how to take this medication, talk to your nurse or doctor. Original instructions: Take 1 tablet (2 mg total) by mouth daily. Or as directed by Coumadin Clinic      Allergies  Allergen Reactions  . Doxycycline Hives  . Latex Swelling    Pt reports swelling at site.   . Morphine And Related Anxiety    Follow-up Information    Chesley Noon, MD. Schedule an appointment as soon as possible for a visit in 1 week(s).   Specialty: Family Medicine Contact information: [place] [address] [phone]                The results of significant diagnostics from this hospitalization (including imaging, microbiology, ancillary and laboratory) are listed below for reference.    Significant Diagnostic Studies: CT HIP LEFT WO CONTRAST  Result Date: 06/15/2019 CLINICAL DATA:  Left thigh swelling EXAM: CT OF THE LEFT FEMUR WITHOUT CONTRAST TECHNIQUE: Multidetector CT imaging of the left femur was performed according to the standard protocol. Multiplanar CT image reconstructions were also generated. COMPARISON:  X-ray 05/20/2019, CT 05/20/2019 FINDINGS: Bones/Joint/Cartilage Interval ORIF of comminuted left acetabular fracture. Osseous structures are in near anatomic alignment. Left hip joint alignment is maintained without dislocation. There is a small left hip joint effusion with a small amount of debris and fracture fragments. SI joints and pubic symphysis appear intact without diastasis. The left femur appears intact without fracture. No cortical destruction or periostitis. Ligaments Suboptimally assessed by CT. Muscles and Tendons No acute myotendinous abnormality. Soft tissues There is a fluid collection at the lateral aspect of thigh at surgical incision site measuring 5.2 x 2.4 x 19.6 cm (volume = 130 cm^3) (series 2, image 85; series 8, image 112).  Collection is predominantly within the subcutaneous soft tissues along the excision site but does appear to have a component tracking toward the greater trochanter through surgical fascial defect. Focal fluid collection within the anterior compartment of the proximal thigh along the medial aspect of the proximal rectus femoris muscle measuring 3.2 x 2.0 x 3.7 cm (volume = 12 cm^3) (series 2, image 120; series 8, image 83). Deep fluid collection within the anterior compartment of the thigh located superficial to the vastus medialis muscle measuring approximately 5.3 x 4.5 x 15.0 cm (volume = 190 cm^3) (series 2, image  182; series 8, image 179). Multilobulated collection within the superficial soft tissues of the mid to distal left thigh located anteromedially measures approximately 4.2 x 10.7 x 15.8 cm (volume = 370 cm^3) (series 2, image 222; series 9, image 84). There is some layering internal hyperdensity within this collection suggesting layering blood products. There are a few lobules of fat also noted within the collection along the non dependent aspect. There is diffuse superficial edema, most pronounced anteriorly. No soft tissue gas. IMPRESSION: 1. There are two fluid collections within the anterior compartment of the left thigh, the largest is located within the mid to distal thigh superficial to the vastus medialis muscle measuring up to 15 cm with approximate volume of 190 mL. An additional collection located within the proximal thigh adjacent to the rectus femoris muscle measures up to 3.7 cm with an approximate volume of 12 mL. Collections may reflect hematomas. An infected fluid collection would be difficult to differentiate by imaging alone. No gas is present within the collections. 2. Large multilobulated superficial collection at the anteromedial aspect of the mid to distal thigh overlying the vastus medialis muscle measuring up to 15.8 cm in length with an approximate volume of 370 mL. Layering  hyperdense component within this collection suggestive of a hematoma. 3. Fluid collection within the superficial soft tissues along the lateral incisional site of the proximal left thigh measuring up to 20 cm in length with an approximate volume of 130 mL, suggestive of a postoperative seroma or hematoma. 4. Interval ORIF of comminuted left acetabular fracture in good anatomic alignment. Electronically Signed   By: Davina Poke D.O.   On: 06/15/2019 15:51   DG Chest Port 1 View  Result Date: 06/30/2019 CLINICAL DATA:  Shortness of breath, desaturation during dialysis EXAM: PORTABLE CHEST 1 VIEW COMPARISON:  05/22/2019 FINDINGS: Single frontal view of the chest demonstrates right internal jugular dialysis catheter tip overlying superior vena cava. Loop recorder overlies left chest. Cardiac silhouette is mildly enlarged. There is vascular congestion, with diffuse interstitial prominence and patchy bilateral perihilar airspace disease left greater than right. There are small bilateral pleural effusions. No pneumothorax. IMPRESSION: 1. Findings most consistent with pulmonary edema. Electronically Signed   By: Randa Ngo M.D.   On: 06/30/2019 23:13   CT EXTREMITY LOWER LEFT WO CONTRAST  Result Date: 06/14/2019 CLINICAL DATA:  Left lower extremity soft tissue infection EXAM: CT OF THE LOWER LEFT EXTREMITY WITHOUT CONTRAST TECHNIQUE: Multidetector CT imaging of the lower left extremity was performed according to the standard protocol. Field of view is from the distal femoral diaphysis through the level of the tibiotalar joint. COMPARISON:  Left knee x-ray 05/20/2019 FINDINGS: Bones/Joint/Cartilage No acute fracture. No dislocation. No bony erosion or periostitis. Mild subchondral cystic changes at the medial patellar facet and patellar apex. Knee joint spaces appear preserved. No knee joint effusion. Ankle mortise is congruent without significant arthropathy. No tibiotalar joint effusion. Ligaments  Suboptimally assessed by CT. Muscles and Tendons Unremarkable appearance of the myotendinous structures. No intramuscular fluid collection. No tenosynovial fluid collection. Soft tissues Circumferential subcutaneous edema throughout the visualized left lower extremity. Layering fluid overlies the superficial investing fascia of the lower extremity musculature. No focal soft tissue ulceration is seen. No soft tissue gas. No deep fascial fluid collections. IMPRESSION: 1. Diffuse left lower extremity subcutaneous fluid and edema. Findings are nonspecific but can be seen in the setting of cellulitis, DVT, venous insufficiency, as well as systemic processes. There is no soft tissue gas or deep  fascial fluid. 2. No acute osseous abnormality. Electronically Signed   By: Davina Poke D.O.   On: 06/14/2019 15:01    Microbiology: Recent Results (from the past 240 hour(s))  SARS Coronavirus 2 by RT PCR (hospital order, performed in [facility] hospital lab) Nasopharyngeal Nasopharyngeal Swab     Status: None   Collection Time: 06/30/19 11:08 PM   Specimen: Nasopharyngeal Swab  Result Value Ref Range Status   SARS Coronavirus 2 NEGATIVE NEGATIVE Final    Comment: (NOTE) SARS-CoV-2 target nucleic acids are NOT DETECTED. The SARS-CoV-2 RNA is generally detectable in upper and lower respiratory specimens during the acute phase of infection. The lowest concentration of SARS-CoV-2 viral copies this assay can detect is 250 copies / mL. A negative result does not preclude SARS-CoV-2 infection and should not be used as the sole basis for treatment or other patient management decisions.  A negative result may occur with improper specimen collection / handling, submission of specimen other than nasopharyngeal swab, presence of viral mutation(s) within the areas targeted by this assay, and inadequate number of viral copies (<250 copies / mL). A negative result must be combined with clinical observations, patient  history, and epidemiological information. Fact Sheet for Patients:   StrictlyIdeas.no Fact Sheet for Healthcare Providers: BankingDealers.co.za This test is not yet approved or cleared  by the [place] FDA and has been authorized for detection and/or diagnosis of SARS-CoV-2 by FDA under an Emergency Use Authorization (EUA).  This EUA will remain in effect (meaning this test can be used) for the duration of the COVID-19 declaration under Section 564(b)(1) of the Act, 21 U.S.C. section 360bbb-3(b)(1), unless the authorization is terminated or revoked sooner. Performed at [facility], [address] [place], [place] [address]      Labs: Basic Metabolic Panel: Recent Labs  Lab 07/02/19 0358 07/03/19 1225 07/04/19 1344 07/05/19 0930 07/06/19 0259  NA 139 136 137 138 139  K 3.1* 3.3* 2.9* 3.1* 2.7*  CL 103 101 101 100 103  CO2 27 25 26 26 30   GLUCOSE 130* 131* 116* 95 159*  BUN 16 14 10 6  5*  CREATININE 5.85* 5.27* 4.67* 4.49* 3.82*  CALCIUM 8.1* 8.3* 8.6* 8.6* 8.7*  MG 1.5*  --   --   --  1.7  PHOS 2.3* 2.4* 2.3* 2.6 2.9   Liver Function Tests: Recent Labs  Lab 06/30/19 2240 07/01/19 1343 07/02/19 0358 07/03/19 1225 07/04/19 1344 07/05/19 0930 07/06/19 0259  AST 18  --   --   --   --   --   --   ALT 14  --   --   --   --   --   --   ALKPHOS 100  --   --   --   --   --   --   BILITOT 1.3*  --   --   --   --   --   --   PROT 7.2  --   --   --   --   --   --   ALBUMIN 2.4*   < > 1.8* 2.0* 2.0* 2.2* 2.2*   < > = values in this interval not displayed.   No results for input(s): LIPASE, AMYLASE in the last 168 hours. No results for input(s): AMMONIA in the last 168 hours. CBC: Recent Labs  Lab 06/30/19 2240 07/01/19 0506 07/02/19 0358 07/03/19 0219 07/04/19 0721 07/05/19 0333 07/06/19 0259  WBC 17.5*   < >  8.6 9.7 8.7 9.3 8.8  NEUTROABS 7.8*  --   --   --   --   --   --   HGB 9.2*   < > 7.8* 8.6* 8.5* 8.3*  7.9*  HCT 31.6*   < > 25.8* 28.9* 28.4* 27.4* 26.9*  MCV 99.1   < > 94.9 94.8 95.3 93.5 96.1  PLT 401*   < > 256 303 329 283 226   < > = values in this interval not displayed.   Cardiac Enzymes: No results for input(s): CKTOTAL, CKMB, CKMBINDEX, TROPONINI in the last 168 hours. BNP: BNP (last 3 results) Recent Labs    06/30/19 2240  BNP 866.0*    ProBNP (last 3 results) No results for input(s): PROBNP in the last 8760 hours.  CBG: Recent Labs  Lab 07/05/19 1140 07/05/19 1751 07/05/19 2314 07/06/19 0608 07/06/19 1157  GLUCAP 97 104* 127* 111* 124*       Signed:  Alma Friendly, MD [facility] 07/06/2019, 1:05 PM

## 2019-07-06 NOTE — Plan of Care (Signed)
Continue to monitor

## 2019-07-06 NOTE — Progress Notes (Signed)
CRITICAL VALUE ALERT  Critical Value:  Potassium 2.7  Date & Time Notied:  07/06/19 0510  Provider Notified: 07/06/19 0511  Orders Received/Actions taken: 3 10 mEq runs of Potassium

## 2019-07-06 NOTE — Progress Notes (Signed)
Patient ID: Max Pittman., male   DOB: May 24, 1975, 44 y.o.   MRN: [id number] [name] Progress Note   Assessment/ Plan:   1.  Volume overload/acute hypoxic respiratory failure: Likely secondary to predominantly using hypotonic peritoneal dialysate as an outpatient resulting in current volume status.  He has had frequent hemodialysis with weight down from 152 kg on admission now to 126 kg and resolution of his respiratory symptoms associated with volume overload.  He still has pedal edema however, this can be safely managed as an outpatient with modification of his peritoneal dialysis (and outpatient hemodialysis if PD fails). 2. ESRD: On peritoneal dialysis with an indwelling right IJ TDC via which we undertook hemodialysis for more aggressive ultrafiltration.  His weight today is 126.2 kg and he reports that his dry weight was 124; I suspect that this will likely be even lower when he attains a euvolemic state with more frequent use of 4% dianeal for PD.  I will contact Dr. Juleen China with a discussion of the plan. 3. Anemia of ESRD/chronic kidney disease: Ongoing intravenous iron, on ESA.  No indication for PRBC transfusion. 4. CKD-MBD: Continue renal diet, not on phosphorus binders.  Calcium level acceptable. 5.  Hypokalemia: Secondary to losses with hemodialysis/renal diet.  Will give oral potassium today. 6. Hypertension: Blood pressures acceptable range to permit for further ultrafiltration/PD.  Subjective:   Reports continued improvement of his swelling and again signed off early from hemodialysis yesterday for no clear reason in spite of several attempts to reeducate him by staff.   Objective:   BP (!) 134/93 (BP Location: Right Arm)   Pulse (!) 103   Temp 97.8 F (36.6 C) (Oral)   Resp 20   Ht 5\' 9"  (1.753 m)   Wt 126.2 kg   SpO2 98%   BMI 41.09 kg/m   Physical Exam: Gen: Obese man, comfortably sitting on the edge of his bed CVS: Pulse regular rhythm, normal  rate, S1 and S2 normal Resp: Decreased breath sounds over bases, no distinct rales or rhonchi. Abd: Soft, obese, nontender, edema over abdominal wall noted. Left periumbilical PD catheter. Ext: 2+ bilateral lower extremity edema  Labs: BMET Recent Labs  Lab 07/01/19 0506 07/01/19 1343 07/02/19 0358 07/03/19 1225 07/04/19 1344 07/05/19 0930 07/06/19 0259  NA 139 139 139 136 137 138 139  K 2.9* 3.0* 3.1* 3.3* 2.9* 3.1* 2.7*  CL 99 98 103 101 101 100 103  CO2 28 25 27 25 26 26 30   GLUCOSE 119* 122* 130* 131* 116* 95 159*  BUN 31* 28* 16 14 10 6  5*  CREATININE 8.29* 8.64* 5.85* 5.27* 4.67* 4.49* 3.82*  CALCIUM 8.6* 8.5* 8.1* 8.3* 8.6* 8.6* 8.7*  PHOS  --  3.7 2.3* 2.4* 2.3* 2.6 2.9   CBC Recent Labs  Lab 06/30/19 2240 07/01/19 0506 07/03/19 0219 07/04/19 0721 07/05/19 0333 07/06/19 0259  WBC 17.5*   < > 9.7 8.7 9.3 8.8  NEUTROABS 7.8*  --   --   --   --   --   HGB 9.2*   < > 8.6* 8.5* 8.3* 7.9*  HCT 31.6*   < > 28.9* 28.4* 27.4* 26.9*  MCV 99.1   < > 94.8 95.3 93.5 96.1  PLT 401*   < > 303 329 283 226   < > = values in this interval not displayed.     Medications:    . amLODipine  5 mg Oral Daily  . Chlorhexidine Gluconate Cloth  6 each Topical Q0600  . gentamicin cream  1 application Topical Daily  . insulin aspart  0-15 Units Subcutaneous TID WC  . insulin aspart  0-5 Units Subcutaneous QHS  . metoprolol tartrate  12.5 mg Oral BID  . Warfarin - Pharmacist Dosing Inpatient   Does not apply q1600   Elmarie Shiley, MD 07/06/2019, 7:52 AM

## 2019-07-06 NOTE — Progress Notes (Signed)
Renal Navigator informed Max Pittman of patient's plan for discharge today and faxed 07/06/19 Renal Note and Discharge Summary to provide continuity of care.  Alphonzo Cruise, [facility] Renal Navigator [phone]

## 2019-07-06 NOTE — Progress Notes (Signed)
IV and telemetry discontinued. CCMD notified. Discharge instructions reviewed with patient. All questions answered.

## 2019-07-06 NOTE — Progress Notes (Signed)
ANTICOAGULATION CONSULT NOTE - Follow Up Consult  Pharmacy Consult for warfarin Indication:  DVT  Allergies  Allergen Reactions   Doxycycline Hives   Latex Swelling    Pt reports swelling at site.    Morphine And Related Anxiety    Patient Measurements: Height: 5\' 9"  (175.3 cm) Weight: 126.2 kg (278 lb 3.5 oz) IBW/kg (Calculated) : 70.7   heparin dosing weight: 102 kg  Vital Signs: Temp: 97.8 F (36.6 C) (06/10 0454) Temp Source: Oral (06/10 0454) BP: 134/93 (06/10 0454)  Labs: Recent Labs    07/04/19 0721 07/04/19 1046 07/04/19 1344 07/04/19 2009 07/05/19 0333 07/05/19 0333 07/05/19 0930 07/05/19 1955 07/06/19 0259  HGB 8.5*   < >  --   --  8.3*  --   --   --  7.9*  HCT 28.4*  --   --   --  27.4*  --   --   --  26.9*  PLT 329  --   --   --  283  --   --   --  226  LABPROT 16.4*  --   --   --  18.4*  --   --   --  23.2*  INR 1.4*  --   --   --  1.6*  --   --   --  2.1*  HEPARINUNFRC 0.12*   < >  --    < > 0.34   < > <0.10* 0.28* >2.20*  CREATININE  --   --  4.67*  --   --   --  4.49*  --  3.82*   < > = values in this interval not displayed.    Estimated Creatinine Clearance: 32.8 mL/min (A) (by C-G formula based on SCr of 3.82 mg/dL (H)).   Assessment: 71 yom on warfarin PTA for provoked LLE DVT on 06/04/2019. Plan at that time was to continue warfarin for 3 months. PTA dose per coumadin clinic note on 6/2 is 3 mg on Wednesday and Saturday, 2 mg all other days.  -heparin discontinued this am with therapeutic INR -INR= 2.1 hg= 7.9   Goal of Therapy:  INR 2-3 Monitor platelets by anticoagulation protocol: Yes   Plan:  -Warfarin 2mg  po today -Daily PT/INR  Hildred Laser, PharmD Clinical Pharmacist **Pharmacist phone directory can now be found on [email or web address] ([email or web address]).  Listed under [place].

## 2019-07-10 ENCOUNTER — Telehealth: Payer: Self-pay | Admitting: *Deleted

## 2019-07-10 NOTE — Telephone Encounter (Signed)
Spoke with Otila Kluver.  Check INR at [facility] visit this week.  Will call results to me.

## 2019-07-10 NOTE — Telephone Encounter (Signed)
-----   Message from Massie Maroon, [place] sent at 07/10/2019  4:03 PM EDT ----- [place] [facility]/ [facility] wanting to know when they need to do another INR - pt is home from hospital  [facility]

## 2019-07-13 ENCOUNTER — Observation Stay (HOSPITAL_COMMUNITY): Payer: Medicare Other

## 2019-07-13 ENCOUNTER — Encounter (HOSPITAL_COMMUNITY): Payer: Self-pay | Admitting: *Deleted

## 2019-07-13 ENCOUNTER — Emergency Department (HOSPITAL_COMMUNITY): Payer: Medicare Other

## 2019-07-13 ENCOUNTER — Other Ambulatory Visit: Payer: Self-pay

## 2019-07-13 ENCOUNTER — Inpatient Hospital Stay (HOSPITAL_COMMUNITY)
Admission: AD | Admit: 2019-07-13 | Discharge: 2019-07-28 | DRG: 463 | Disposition: A | Discharge status: Home Health | Payer: Medicare Other | Attending: Internal Medicine | Admitting: Internal Medicine

## 2019-07-13 DIAGNOSIS — J9601 Acute respiratory failure with hypoxia: Secondary | ICD-10-CM | POA: Diagnosis present

## 2019-07-13 DIAGNOSIS — Z79899 Other long term (current) drug therapy: Secondary | ICD-10-CM

## 2019-07-13 DIAGNOSIS — Z6841 Body Mass Index (BMI) 40.0 and over, adult: Secondary | ICD-10-CM | POA: Diagnosis not present

## 2019-07-13 DIAGNOSIS — T82898A Other specified complication of vascular prosthetic devices, implants and grafts, initial encounter: Secondary | ICD-10-CM | POA: Diagnosis present

## 2019-07-13 DIAGNOSIS — E8889 Other specified metabolic disorders: Secondary | ICD-10-CM | POA: Diagnosis present

## 2019-07-13 DIAGNOSIS — A419 Sepsis, unspecified organism: Secondary | ICD-10-CM | POA: Diagnosis present

## 2019-07-13 DIAGNOSIS — R7881 Bacteremia: Secondary | ICD-10-CM | POA: Diagnosis not present

## 2019-07-13 DIAGNOSIS — A4101 Sepsis due to Methicillin susceptible Staphylococcus aureus: Secondary | ICD-10-CM | POA: Diagnosis present

## 2019-07-13 DIAGNOSIS — I161 Hypertensive emergency: Secondary | ICD-10-CM | POA: Diagnosis present

## 2019-07-13 DIAGNOSIS — Z885 Allergy status to narcotic agent status: Secondary | ICD-10-CM

## 2019-07-13 DIAGNOSIS — Z992 Dependence on renal dialysis: Secondary | ICD-10-CM

## 2019-07-13 DIAGNOSIS — I63019 Cerebral infarction due to thrombosis of unspecified vertebral artery: Secondary | ICD-10-CM | POA: Diagnosis not present

## 2019-07-13 DIAGNOSIS — N2581 Secondary hyperparathyroidism of renal origin: Secondary | ICD-10-CM | POA: Diagnosis present

## 2019-07-13 DIAGNOSIS — E876 Hypokalemia: Secondary | ICD-10-CM | POA: Diagnosis present

## 2019-07-13 DIAGNOSIS — R102 Pelvic and perineal pain: Secondary | ICD-10-CM | POA: Diagnosis not present

## 2019-07-13 DIAGNOSIS — B9561 Methicillin susceptible Staphylococcus aureus infection as the cause of diseases classified elsewhere: Secondary | ICD-10-CM | POA: Diagnosis not present

## 2019-07-13 DIAGNOSIS — M25452 Effusion, left hip: Secondary | ICD-10-CM | POA: Diagnosis present

## 2019-07-13 DIAGNOSIS — Z952 Presence of prosthetic heart valve: Secondary | ICD-10-CM | POA: Diagnosis not present

## 2019-07-13 DIAGNOSIS — E1122 Type 2 diabetes mellitus with diabetic chronic kidney disease: Secondary | ICD-10-CM | POA: Diagnosis present

## 2019-07-13 DIAGNOSIS — W19XXXA Unspecified fall, initial encounter: Secondary | ICD-10-CM

## 2019-07-13 DIAGNOSIS — Z8673 Personal history of transient ischemic attack (TIA), and cerebral infarction without residual deficits: Secondary | ICD-10-CM

## 2019-07-13 DIAGNOSIS — E1165 Type 2 diabetes mellitus with hyperglycemia: Secondary | ICD-10-CM | POA: Diagnosis present

## 2019-07-13 DIAGNOSIS — Z452 Encounter for adjustment and management of vascular access device: Secondary | ICD-10-CM

## 2019-07-13 DIAGNOSIS — T8452XA Infection and inflammatory reaction due to internal left hip prosthesis, initial encounter: Principal | ICD-10-CM | POA: Diagnosis present

## 2019-07-13 DIAGNOSIS — D649 Anemia, unspecified: Secondary | ICD-10-CM | POA: Diagnosis present

## 2019-07-13 DIAGNOSIS — M25562 Pain in left knee: Secondary | ICD-10-CM | POA: Diagnosis present

## 2019-07-13 DIAGNOSIS — E1142 Type 2 diabetes mellitus with diabetic polyneuropathy: Secondary | ICD-10-CM | POA: Diagnosis present

## 2019-07-13 DIAGNOSIS — E118 Type 2 diabetes mellitus with unspecified complications: Secondary | ICD-10-CM | POA: Diagnosis not present

## 2019-07-13 DIAGNOSIS — Y792 Prosthetic and other implants, materials and accessory orthopedic devices associated with adverse incidents: Secondary | ICD-10-CM | POA: Diagnosis present

## 2019-07-13 DIAGNOSIS — D631 Anemia in chronic kidney disease: Secondary | ICD-10-CM | POA: Diagnosis present

## 2019-07-13 DIAGNOSIS — Z7901 Long term (current) use of anticoagulants: Secondary | ICD-10-CM

## 2019-07-13 DIAGNOSIS — Z881 Allergy status to other antibiotic agents status: Secondary | ICD-10-CM

## 2019-07-13 DIAGNOSIS — L02416 Cutaneous abscess of left lower limb: Secondary | ICD-10-CM | POA: Diagnosis present

## 2019-07-13 DIAGNOSIS — N186 End stage renal disease: Secondary | ICD-10-CM

## 2019-07-13 DIAGNOSIS — Z833 Family history of diabetes mellitus: Secondary | ICD-10-CM

## 2019-07-13 DIAGNOSIS — Z87891 Personal history of nicotine dependence: Secondary | ICD-10-CM

## 2019-07-13 DIAGNOSIS — L98499 Non-pressure chronic ulcer of skin of other sites with unspecified severity: Secondary | ICD-10-CM | POA: Diagnosis present

## 2019-07-13 DIAGNOSIS — E119 Type 2 diabetes mellitus without complications: Secondary | ICD-10-CM

## 2019-07-13 DIAGNOSIS — B999 Unspecified infectious disease: Secondary | ICD-10-CM

## 2019-07-13 DIAGNOSIS — I252 Old myocardial infarction: Secondary | ICD-10-CM

## 2019-07-13 DIAGNOSIS — I132 Hypertensive heart and chronic kidney disease with heart failure and with stage 5 chronic kidney disease, or end stage renal disease: Secondary | ICD-10-CM | POA: Diagnosis present

## 2019-07-13 DIAGNOSIS — L0231 Cutaneous abscess of buttock: Secondary | ICD-10-CM | POA: Diagnosis present

## 2019-07-13 DIAGNOSIS — E1159 Type 2 diabetes mellitus with other circulatory complications: Secondary | ICD-10-CM | POA: Diagnosis not present

## 2019-07-13 DIAGNOSIS — Z8249 Family history of ischemic heart disease and other diseases of the circulatory system: Secondary | ICD-10-CM

## 2019-07-13 DIAGNOSIS — Z20822 Contact with and (suspected) exposure to covid-19: Secondary | ICD-10-CM | POA: Diagnosis present

## 2019-07-13 DIAGNOSIS — K219 Gastro-esophageal reflux disease without esophagitis: Secondary | ICD-10-CM | POA: Diagnosis present

## 2019-07-13 DIAGNOSIS — S32810A Multiple fractures of pelvis with stable disruption of pelvic ring, initial encounter for closed fracture: Secondary | ICD-10-CM | POA: Diagnosis present

## 2019-07-13 DIAGNOSIS — Z86718 Personal history of other venous thrombosis and embolism: Secondary | ICD-10-CM

## 2019-07-13 DIAGNOSIS — M869 Osteomyelitis, unspecified: Secondary | ICD-10-CM | POA: Diagnosis present

## 2019-07-13 DIAGNOSIS — E872 Acidosis: Secondary | ICD-10-CM | POA: Diagnosis present

## 2019-07-13 DIAGNOSIS — I639 Cerebral infarction, unspecified: Secondary | ICD-10-CM | POA: Diagnosis not present

## 2019-07-13 DIAGNOSIS — J811 Chronic pulmonary edema: Secondary | ICD-10-CM | POA: Diagnosis present

## 2019-07-13 DIAGNOSIS — E1169 Type 2 diabetes mellitus with other specified complication: Secondary | ICD-10-CM | POA: Diagnosis present

## 2019-07-13 DIAGNOSIS — I340 Nonrheumatic mitral (valve) insufficiency: Secondary | ICD-10-CM | POA: Diagnosis not present

## 2019-07-13 DIAGNOSIS — M25552 Pain in left hip: Secondary | ICD-10-CM | POA: Diagnosis not present

## 2019-07-13 DIAGNOSIS — T847XXA Infection and inflammatory reaction due to other internal orthopedic prosthetic devices, implants and grafts, initial encounter: Secondary | ICD-10-CM | POA: Diagnosis present

## 2019-07-13 DIAGNOSIS — Z91040 Latex allergy status: Secondary | ICD-10-CM

## 2019-07-13 DIAGNOSIS — M1710 Unilateral primary osteoarthritis, unspecified knee: Secondary | ICD-10-CM | POA: Diagnosis present

## 2019-07-13 DIAGNOSIS — R52 Pain, unspecified: Secondary | ICD-10-CM

## 2019-07-13 DIAGNOSIS — I10 Essential (primary) hypertension: Secondary | ICD-10-CM | POA: Diagnosis present

## 2019-07-13 DIAGNOSIS — G4730 Sleep apnea, unspecified: Secondary | ICD-10-CM | POA: Diagnosis present

## 2019-07-13 DIAGNOSIS — Z83438 Family history of other disorder of lipoprotein metabolism and other lipidemia: Secondary | ICD-10-CM

## 2019-07-13 HISTORY — DX: Acute embolism and thrombosis of unspecified deep veins of unspecified lower extremity: I82.409

## 2019-07-13 HISTORY — DX: Sleep apnea, unspecified: G47.30

## 2019-07-13 LAB — CBC WITH DIFFERENTIAL/PLATELET
Abs Immature Granulocytes: 0.19 10*3/uL — ABNORMAL HIGH (ref 0.00–0.07)
Basophils Absolute: 0 10*3/uL (ref 0.0–0.1)
Basophils Relative: 0 %
Eosinophils Absolute: 0 10*3/uL (ref 0.0–0.5)
Eosinophils Relative: 0 %
HCT: 33.3 % — ABNORMAL LOW (ref 39.0–52.0)
Hemoglobin: 10 g/dL — ABNORMAL LOW (ref 13.0–17.0)
Immature Granulocytes: 1 %
Lymphocytes Relative: 8 %
Lymphs Abs: 1 10*3/uL (ref 0.7–4.0)
MCH: 27.8 pg (ref 26.0–34.0)
MCHC: 30 g/dL (ref 30.0–36.0)
MCV: 92.5 fL (ref 80.0–100.0)
Monocytes Absolute: 1.4 10*3/uL — ABNORMAL HIGH (ref 0.1–1.0)
Monocytes Relative: 10 %
Neutro Abs: 10.6 10*3/uL — ABNORMAL HIGH (ref 1.7–7.7)
Neutrophils Relative %: 81 %
Platelets: 296 10*3/uL (ref 150–400)
RBC: 3.6 MIL/uL — ABNORMAL LOW (ref 4.22–5.81)
RDW: 15.9 % — ABNORMAL HIGH (ref 11.5–15.5)
WBC: 13.2 10*3/uL — ABNORMAL HIGH (ref 4.0–10.5)
nRBC: 0 % (ref 0.0–0.2)

## 2019-07-13 LAB — APTT: aPTT: 66 seconds — ABNORMAL HIGH (ref 24–36)

## 2019-07-13 LAB — COMPREHENSIVE METABOLIC PANEL
ALT: 11 U/L (ref 0–44)
AST: 14 U/L — ABNORMAL LOW (ref 15–41)
Albumin: 2.2 g/dL — ABNORMAL LOW (ref 3.5–5.0)
Alkaline Phosphatase: 122 U/L (ref 38–126)
Anion gap: 15 (ref 5–15)
BUN: 32 mg/dL — ABNORMAL HIGH (ref 6–20)
CO2: 25 mmol/L (ref 22–32)
Calcium: 8.8 mg/dL — ABNORMAL LOW (ref 8.9–10.3)
Chloride: 87 mmol/L — ABNORMAL LOW (ref 98–111)
Creatinine, Ser: 8.23 mg/dL — ABNORMAL HIGH (ref 0.61–1.24)
GFR calc Af Amer: 8 mL/min — ABNORMAL LOW (ref >60)
GFR calc non Af Amer: 7 mL/min — ABNORMAL LOW (ref >60)
Glucose, Bld: 443 mg/dL — ABNORMAL HIGH (ref 70–99)
Potassium: 3.7 mmol/L (ref 3.5–5.1)
Sodium: 127 mmol/L — ABNORMAL LOW (ref 135–145)
Total Bilirubin: 0.6 mg/dL (ref 0.3–1.2)
Total Protein: 8.1 g/dL (ref 6.5–8.1)

## 2019-07-13 LAB — PROTIME-INR
INR: 2.6 — ABNORMAL HIGH (ref 0.8–1.2)
Prothrombin Time: 27.2 seconds — ABNORMAL HIGH (ref 11.4–15.2)

## 2019-07-13 LAB — LACTIC ACID, PLASMA
Lactic Acid, Venous: 3 mmol/L (ref 0.5–1.9)
Lactic Acid, Venous: 3.3 mmol/L (ref 0.5–1.9)
Lactic Acid, Venous: 2.6 mmol/L (ref 0.5–1.9)

## 2019-07-13 LAB — SARS CORONAVIRUS 2 BY RT PCR (HOSPITAL ORDER, PERFORMED IN ~~LOC~~ HOSPITAL LAB): SARS Coronavirus 2: NEGATIVE

## 2019-07-13 LAB — GLUCOSE, CAPILLARY: Glucose-Capillary: 174 mg/dL — ABNORMAL HIGH (ref 70–99)

## 2019-07-13 IMAGING — DX DG HIP (WITH OR WITHOUT PELVIS) 2-3V*L*
3 series · 3 of 3 positions shown · non-contrast
Comparison: CT left hip [DATE]

CLINICAL DATA: Pain. Previous surgical repair for acetabular
fracture

EXAM:
DG HIP (WITH OR WITHOUT PELVIS) 2-3V LEFT

[pelvis ap]
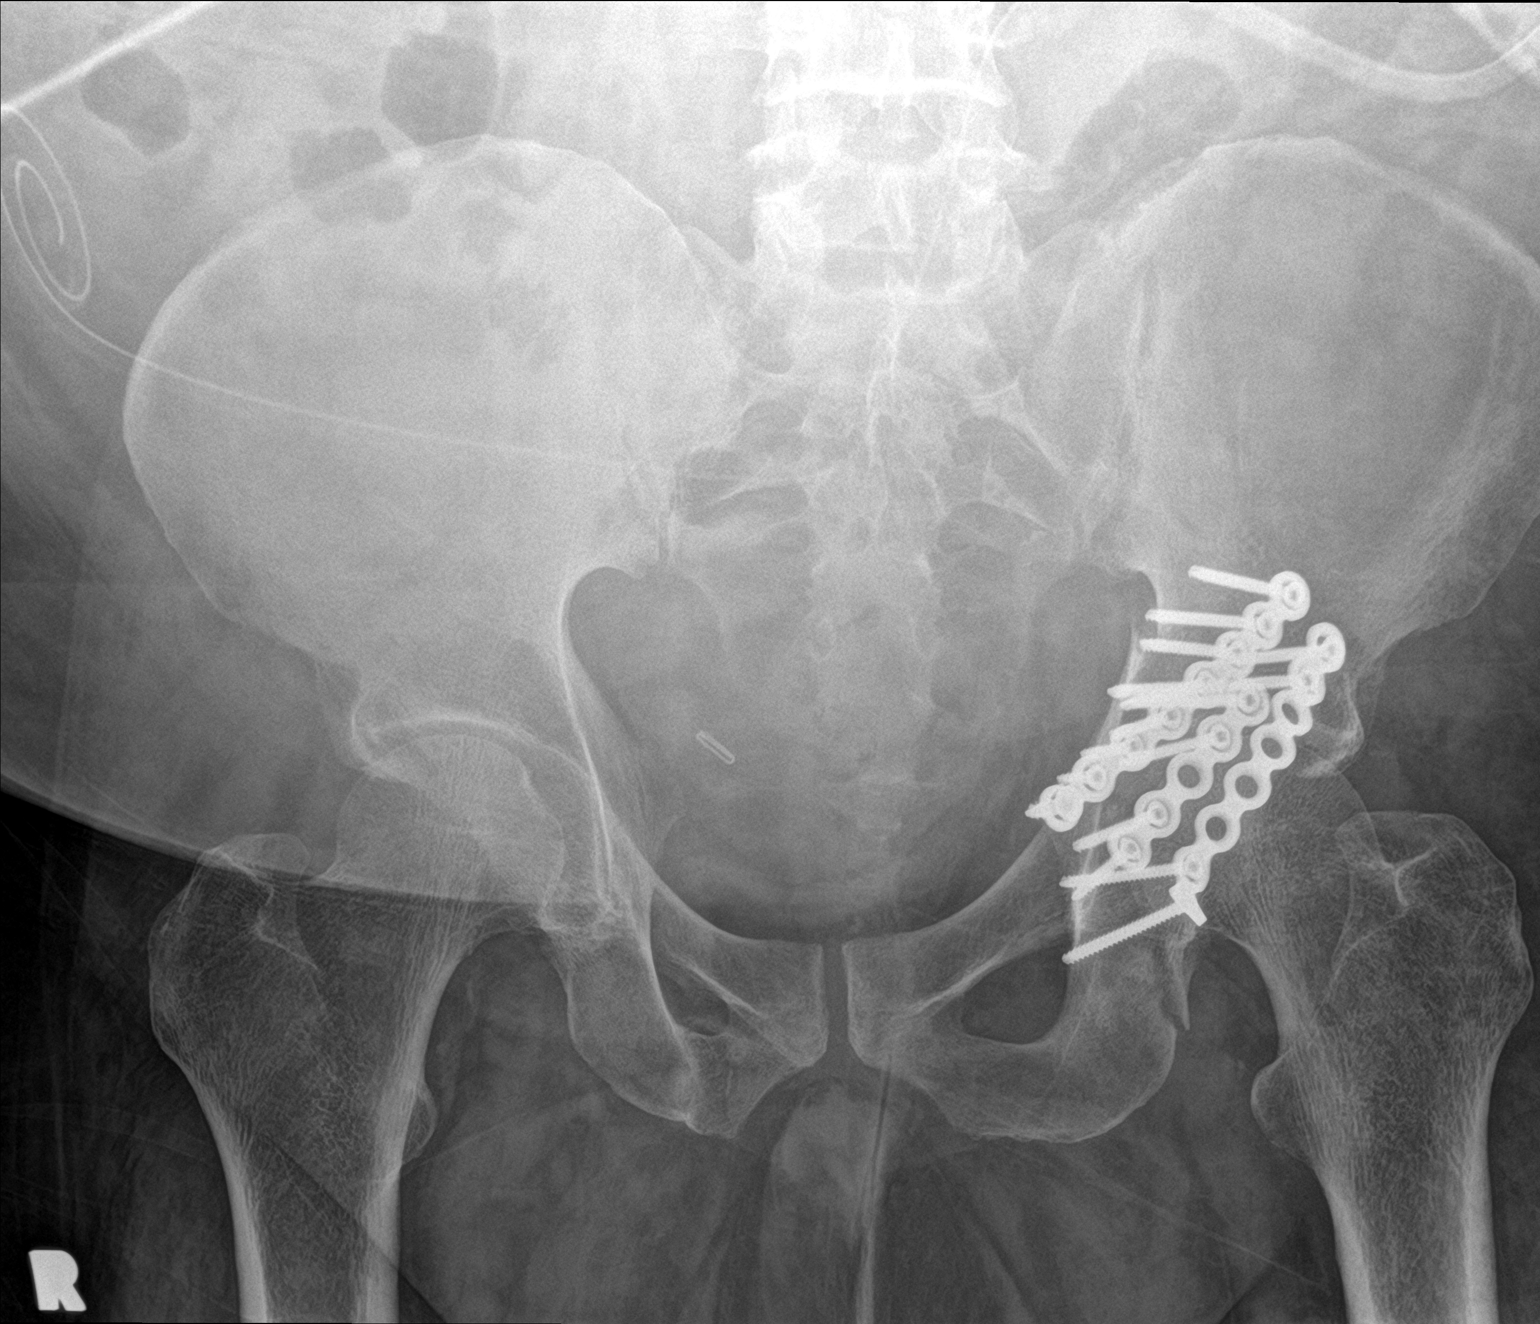

[hip ap]
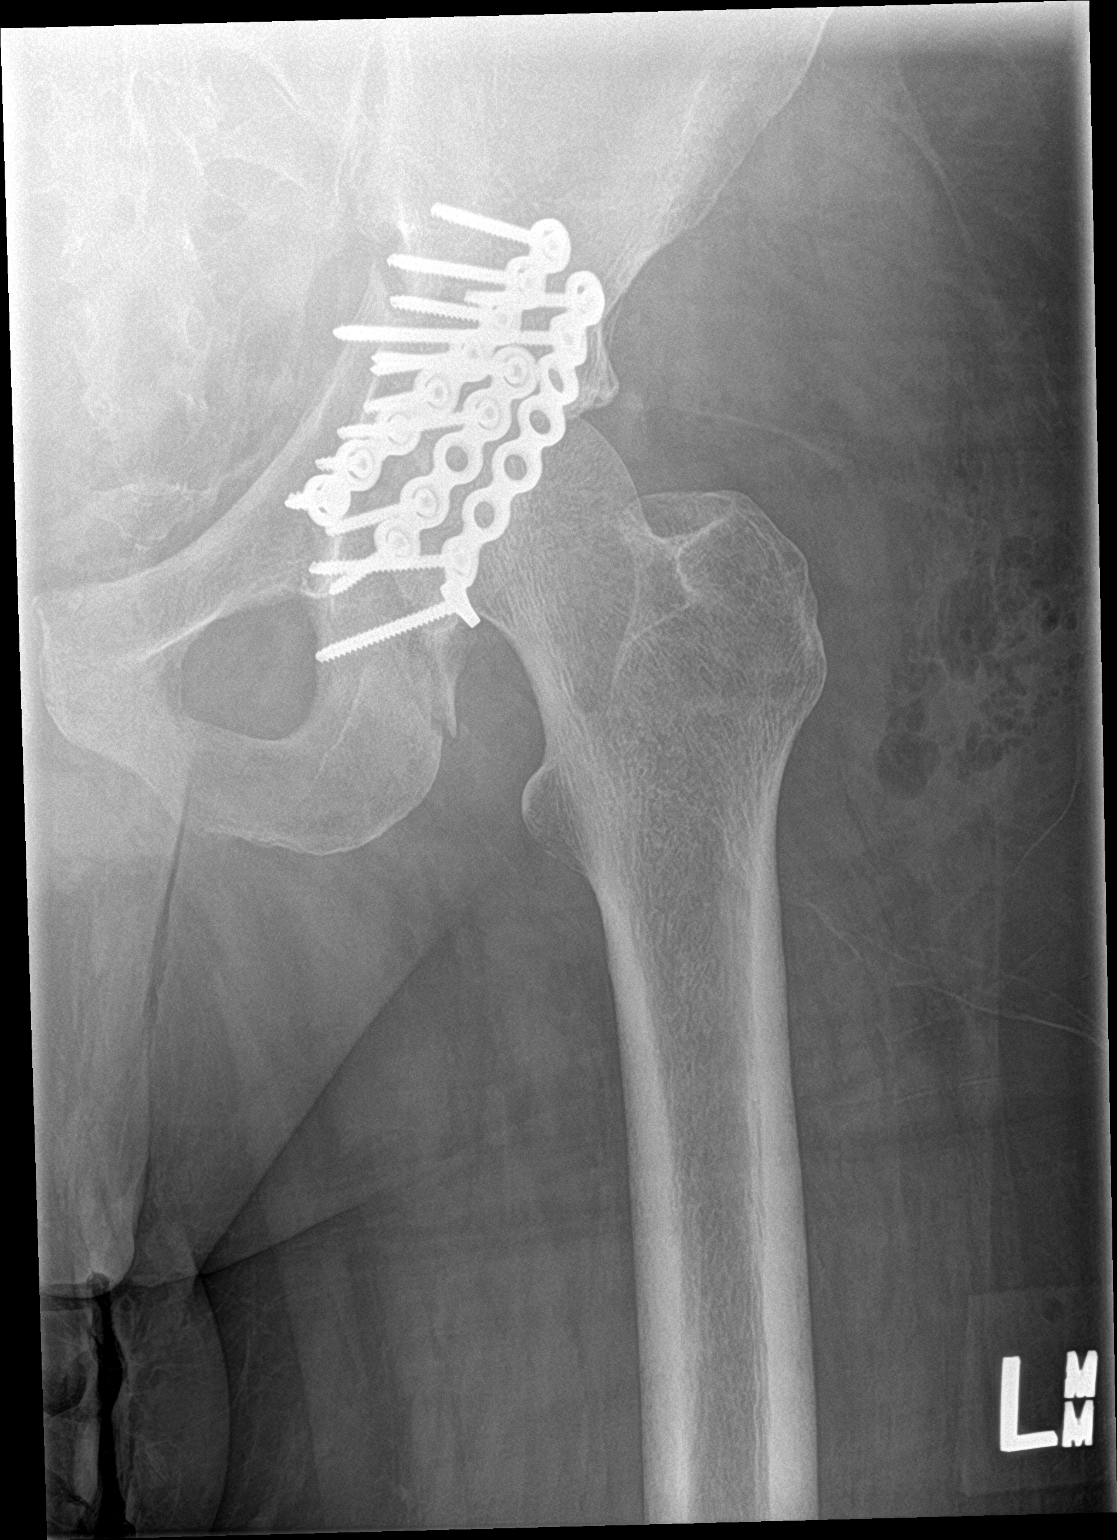

[hip lat]
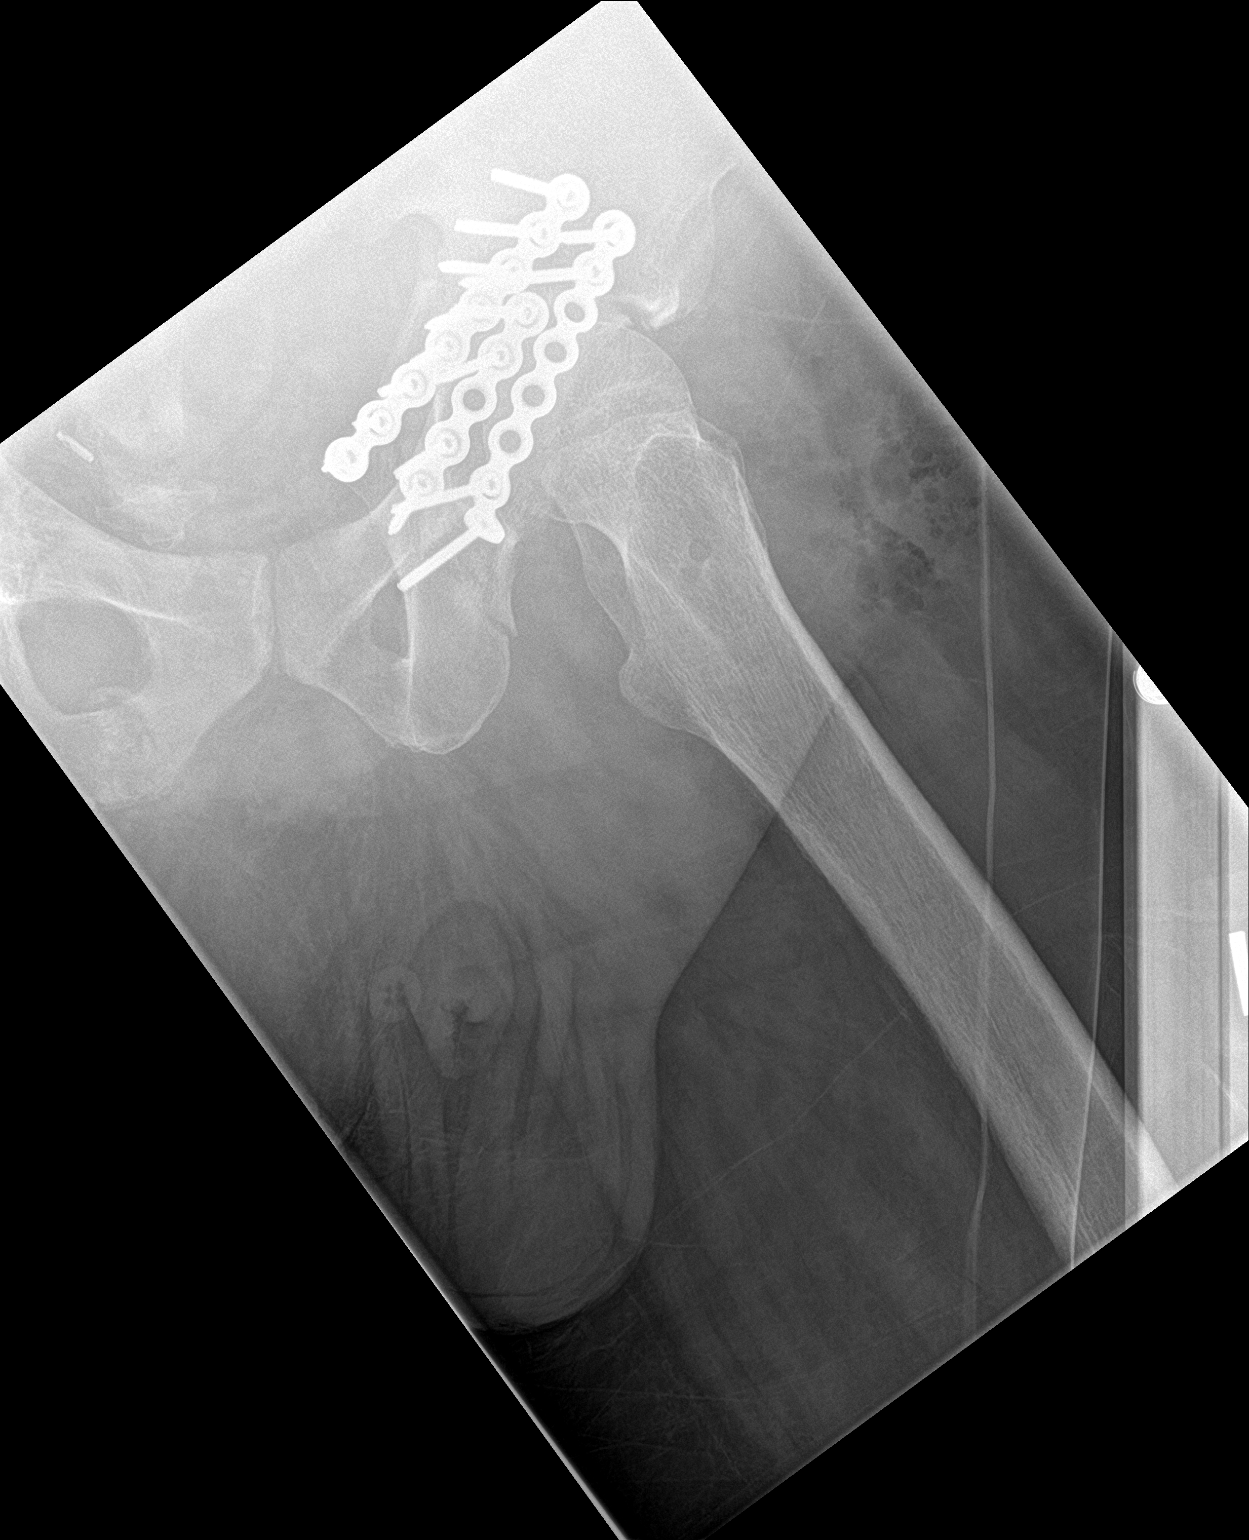

[3 of 3 positions shown; findings below may reference images not displayed]

FINDINGS: Frontal pelvis as well as frontal and lateral left hip images were
obtained. There is screw and plate fixation through acetabular
fractures on the left with alignment near anatomic. No acute
fracture or dislocation. There is mild symmetric narrowing of each
hip joint. No erosive change.
IMPRESSION: Postoperative change left hip joint with near anatomic alignment at
fracture fracture sites. Note very little callus formation in
visualized fracture site areas. No acute fracture or dislocation.
Symmetric narrowing each hip joint.

## 2019-07-13 IMAGING — DX DG CHEST 2V
2 series · 2 of 2 positions shown · non-contrast
Comparison: [DATE]

CLINICAL DATA: Fever, elevated white blood cell count

EXAM:
CHEST - 2 VIEW

[chest lat]
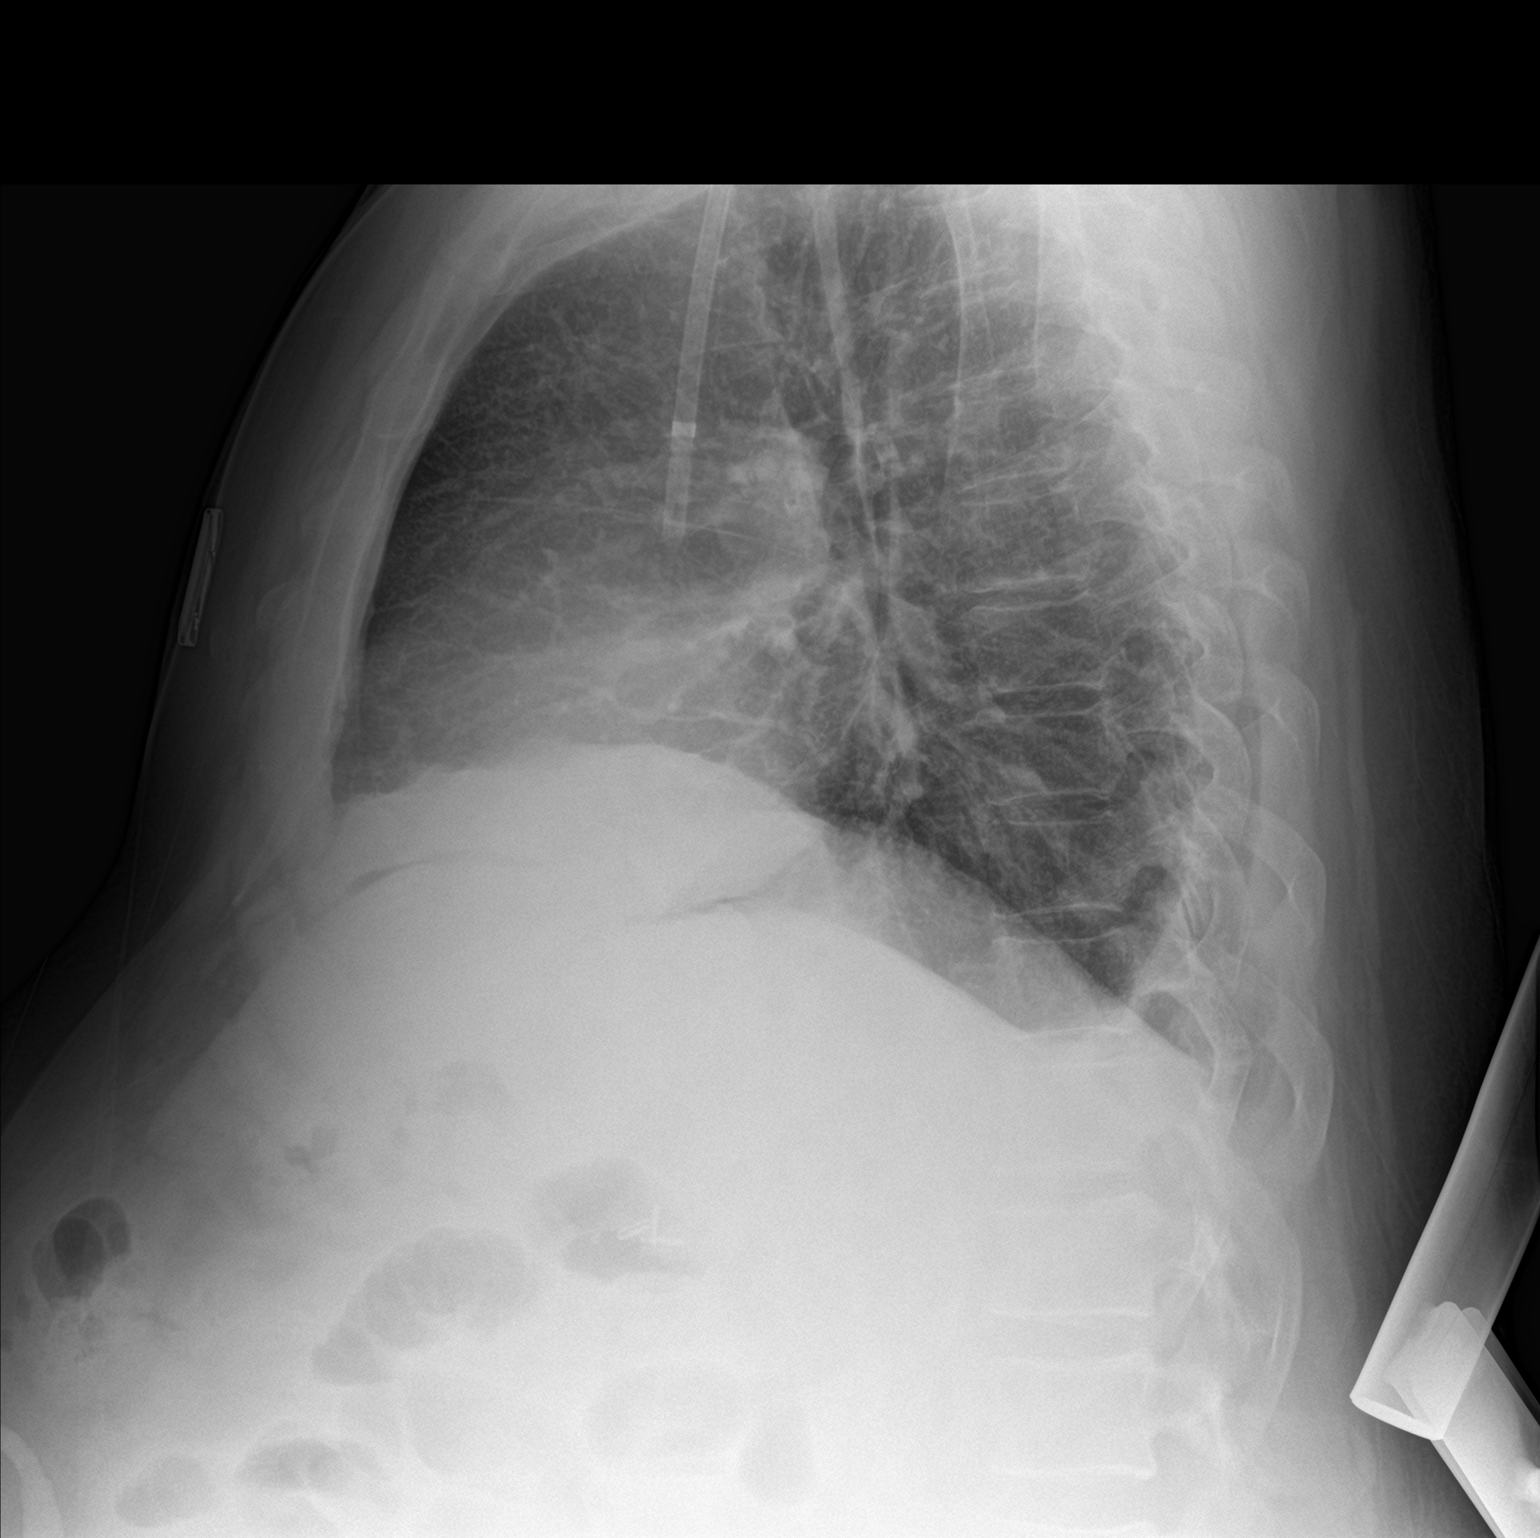

[chest ap]
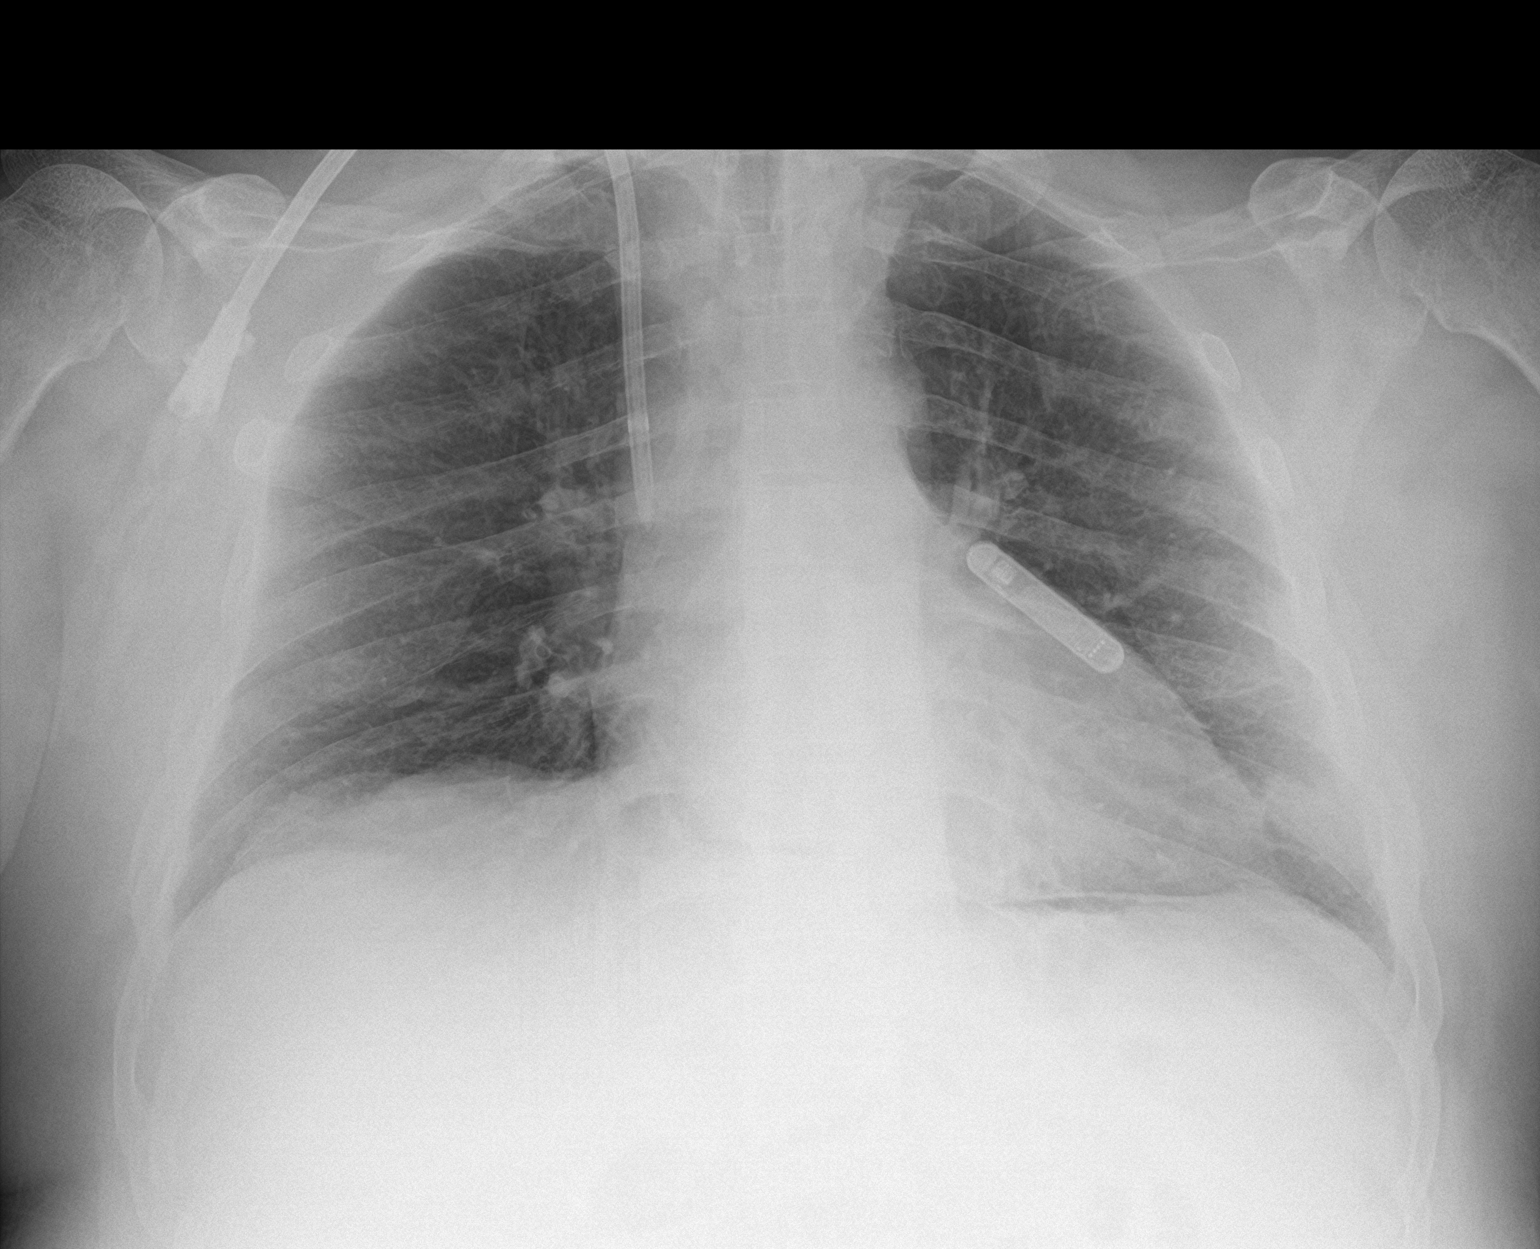

[2 of 2 positions shown; findings below may reference images not displayed]

FINDINGS: Right IJ hemodialysis catheter terminating at the level of the
distal SVC, unchanged. Left chest wall loop recorder device. Stable
mild cardiomegaly. Mild vascular congestion without focal airspace
consolidation or overt edema.
IMPRESSION: Mild pulmonary vascular congestion without focal airspace
consolidation.

## 2019-07-13 IMAGING — CT CT HIP*L* W/CM
2 of 3 series · 16 of 46 positions shown, 18 images · IV contrast (agent unspecified)
Comparison: [DATE], [DATE]

CONTRAST:  75mL OMNIPAQUE IOHEXOL 300 MG/ML  SOLN

CLINICAL DATA: Worsening hip pain after surgery in [facility]

EXAM:
CT OF THE LOWER LEFT EXTREMITY WITH CONTRAST
TECHNIQUE: Multidetector CT imaging of the lower left extremity was performed
according to the standard protocol following intravenous contrast
administration.

[Series 5: 3 axial soft · axial · 0.49mm/px · z∈[+688,+882]mm · 13 of 113 slices shown, 15 images]
[im 8/113  soft-tissue]
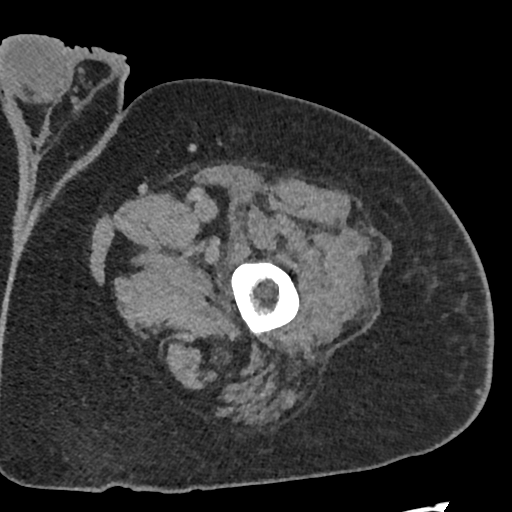
[im 8/113  bone]
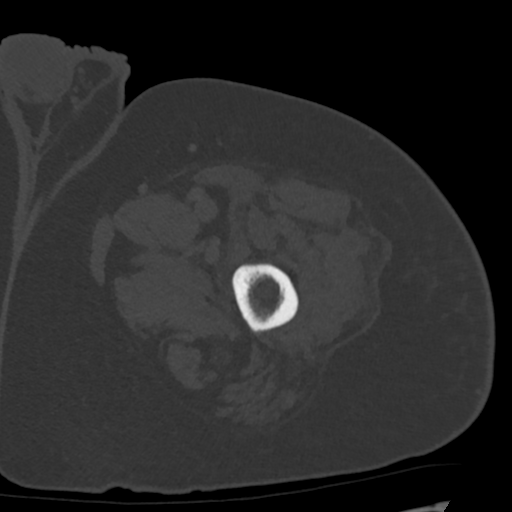
[im 15/113  soft-tissue]
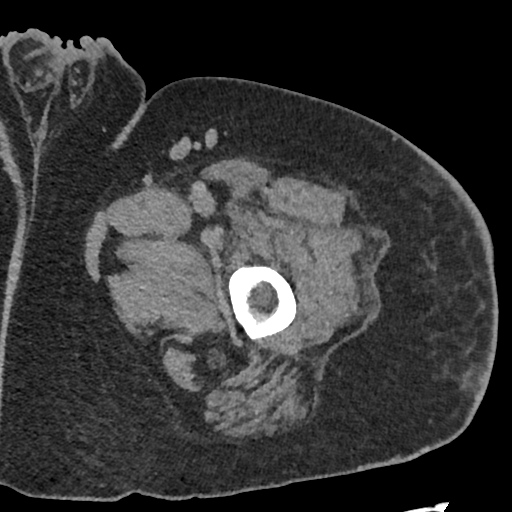
[im 22/113  soft-tissue]
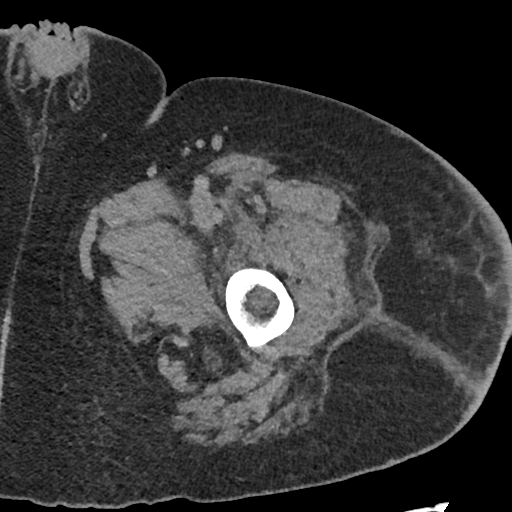
[im 33/113  soft-tissue]
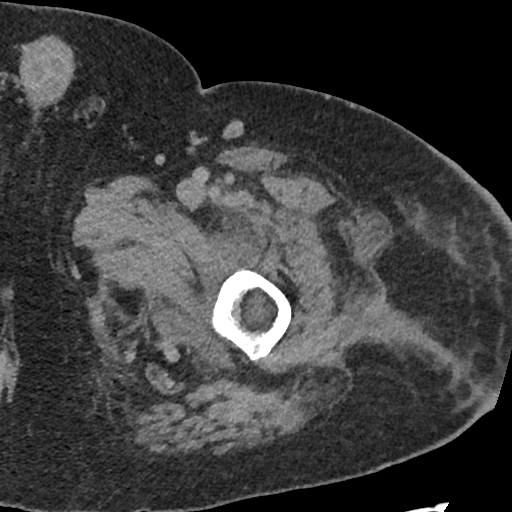
[im 40/113  soft-tissue]
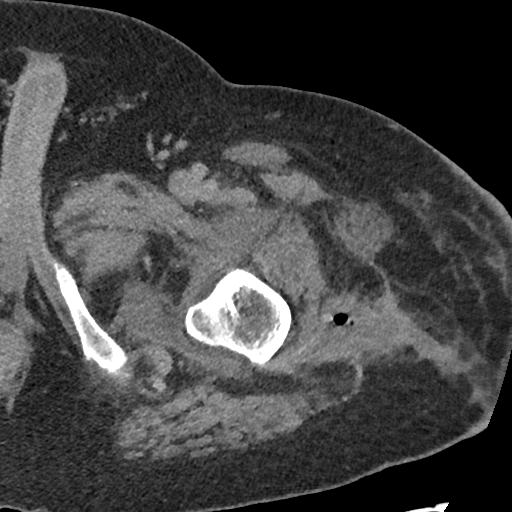
[im 47/113  soft-tissue]
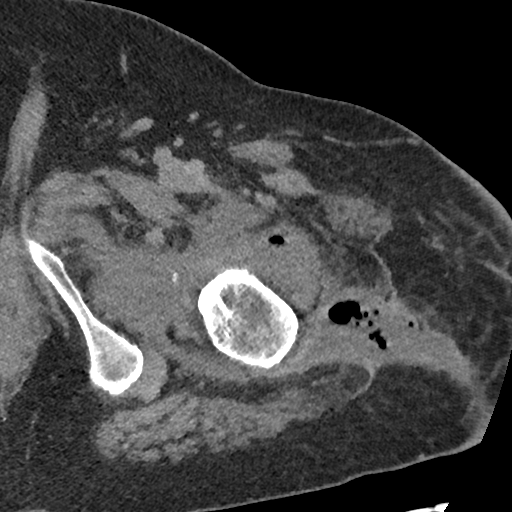
[im 58/113  soft-tissue]
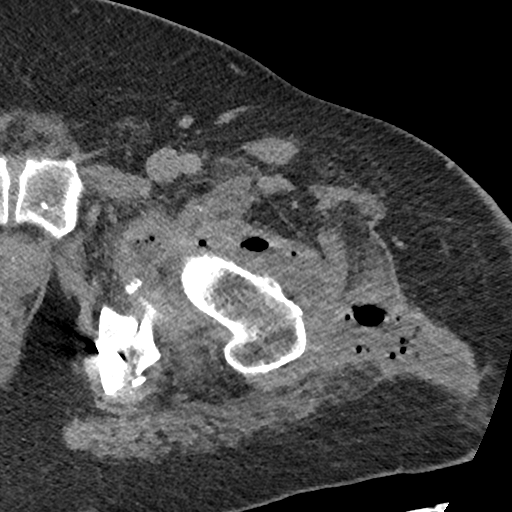
[im 66/113  soft-tissue]
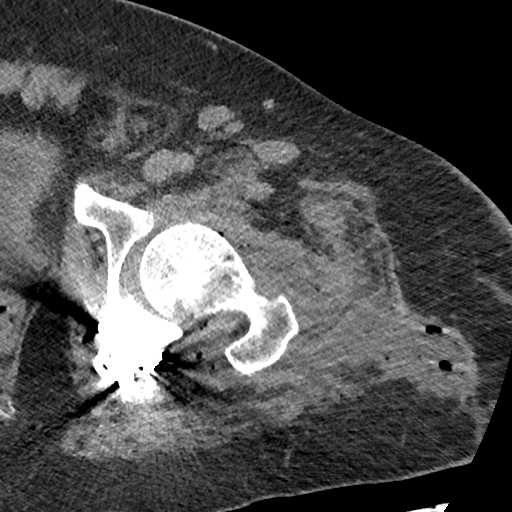
[im 73/113  soft-tissue]
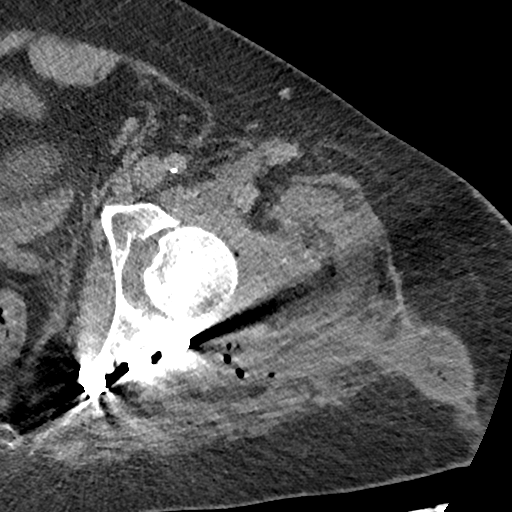
[im 73/113  bone]
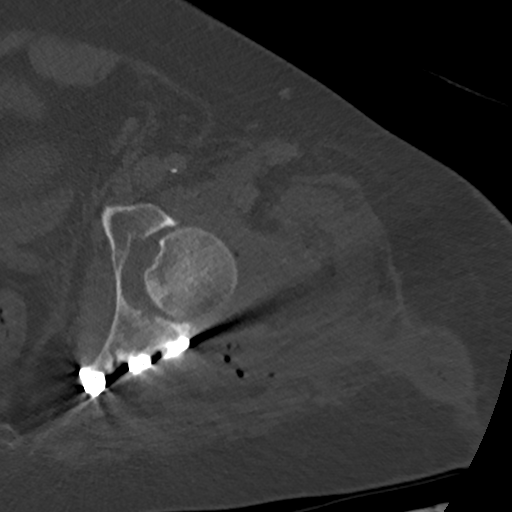
[im 80/113  soft-tissue]
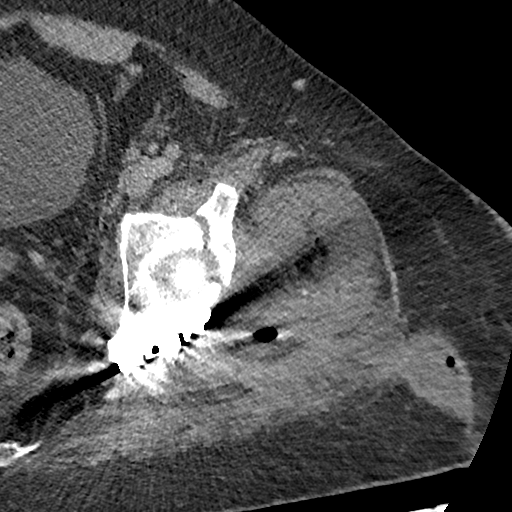
[im 91/113  soft-tissue]
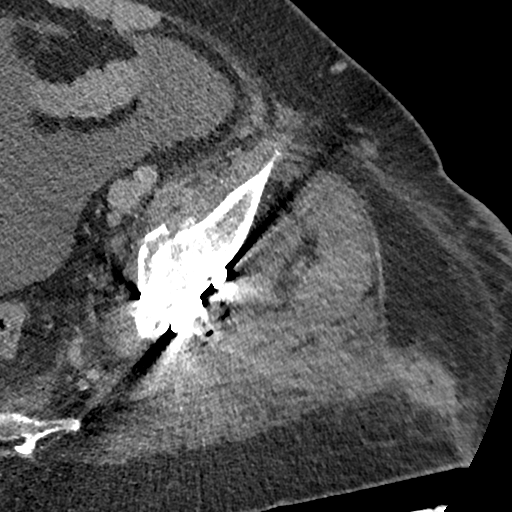
[im 98/113  soft-tissue]
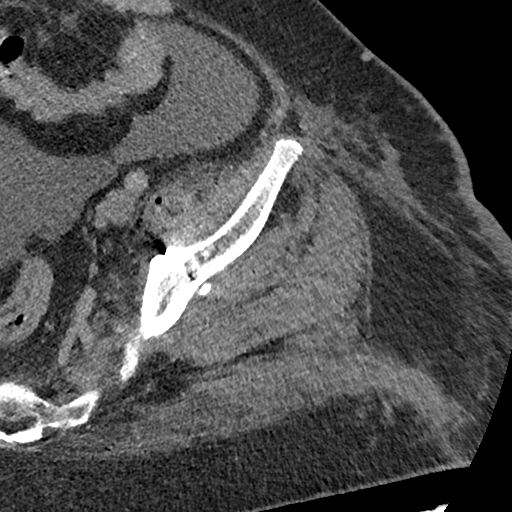
[im 105/113  soft-tissue]
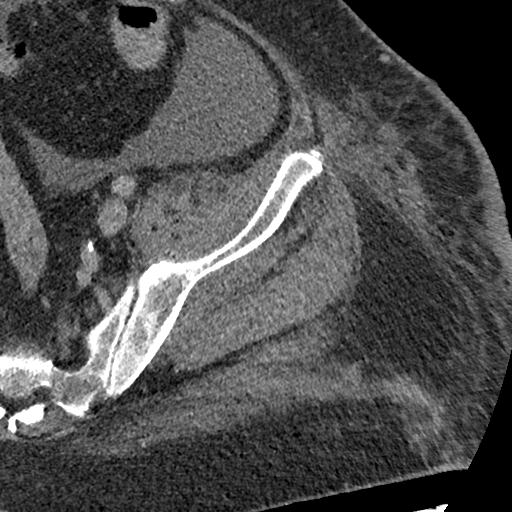

[Series 8: coronal st · coronal · 0.49mm/px · 3 of 104 slices shown]
[im 35/104  soft-tissue]
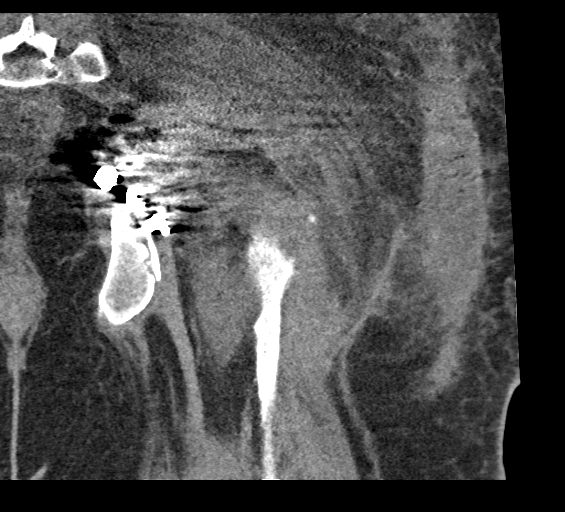
[im 46/104  soft-tissue]
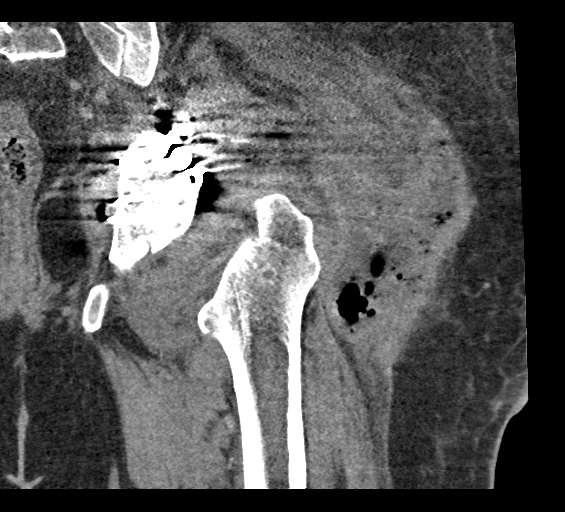
[im 58/104  soft-tissue]
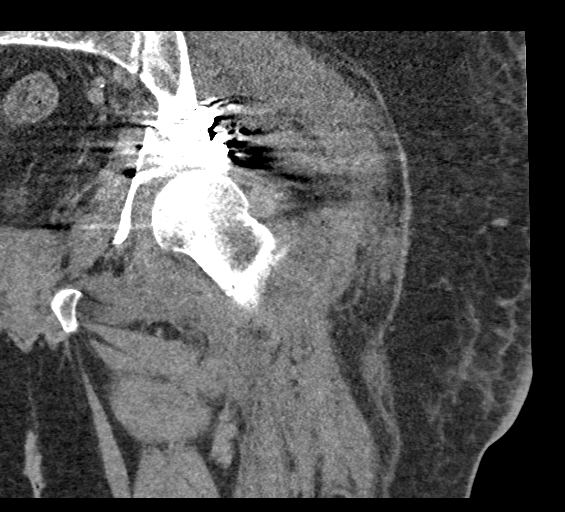

[16 of 46 positions shown; findings below may reference images not displayed]

FINDINGS: Bones/Joint/Cartilage

Status post ORIF of comminuted left acetabular fracture with near
anatomic alignment and similar alignment of fracture fragments as
compared with the previous CT. Hardware appears intact. Fracture
lucencies remain visible, no solid callus identified. Small to
moderate hip effusion, now containing gas locules. Small osseous
fragments within the joint fluid and inferior to the right hip as
before. No bony destructive change at the femur.

Ligaments

Suboptimally assessed by CT.

Muscles and Tendons

.  Mild edema within the intermuscular fat planes.

Soft tissues

Increased size of a subcutaneous fluid collection within the left
lateral hip, deep to the incision site. This measures 7.7 cm
transverse by 3.3 cm AP on axial images and about 13.9 cm
craniocaudad on coronal views. Numerous gas bubbles within the fluid
collection. Mild rim enhancement. Possible involvement of left
lateral gluteus musculature by the fluid collection.
IMPRESSION: 1. Increased size of a subcutaneous fluid collection within the left
lateral hip deep to the incision site, now measuring 7.7 x 3.3 x
13.9 cm, and now demonstrating numerous internal gas bubbles and rim
enhancement concerning for a soft tissue abscess. There is potential
involvement of left lateral gluteus musculature. Increased hip
effusion, now small moderate in size, with numerous gas bubbles
present in the hip effusion, concerning for infection/septic hip. No
gross osseous destructive change at this time.
2. Status post ORIF of comminuted left acetabular fracture with
stable alignment of hardware and fracture fragments.

## 2019-07-13 MED ORDER — VANCOMYCIN HCL 2000 MG/400ML IV SOLN
2000.0000 mg | Freq: Once | INTRAVENOUS | Status: AC
  Administered 2019-07-13: 2000 mg via INTRAVENOUS
  Filled 2019-07-13: qty 400

## 2019-07-13 MED ORDER — SODIUM CHLORIDE 0.9 % IV SOLN
2.0000 g | Freq: Once | INTRAVENOUS | Status: AC
  Administered 2019-07-13: 2 g via INTRAVENOUS
  Filled 2019-07-13: qty 2

## 2019-07-13 MED ORDER — SODIUM CHLORIDE 0.9 % IV BOLUS
500.0000 mL | Freq: Once | INTRAVENOUS | Status: AC
  Administered 2019-07-13: 500 mL via INTRAVENOUS

## 2019-07-13 MED ORDER — ACETAMINOPHEN 650 MG RE SUPP: 650.0000 mg | Freq: Four times a day (QID) | RECTAL | Status: DC | PRN

## 2019-07-13 MED ORDER — INSULIN ASPART 100 UNIT/ML ~~LOC~~ SOLN
0.0000 [IU] | Freq: Four times a day (QID) | SUBCUTANEOUS | Status: DC
  Administered 2019-07-13: 2 [IU] via SUBCUTANEOUS
  Administered 2019-07-15: 3 [IU] via SUBCUTANEOUS
  Administered 2019-07-15 – 2019-07-16 (×2): 1 [IU] via SUBCUTANEOUS
  Administered 2019-07-16: 2 [IU] via SUBCUTANEOUS
  Administered 2019-07-16: 1 [IU] via SUBCUTANEOUS
  Administered 2019-07-17: 2 [IU] via SUBCUTANEOUS
  Administered 2019-07-17: 1 [IU] via SUBCUTANEOUS
  Administered 2019-07-18: 3 [IU] via SUBCUTANEOUS
  Administered 2019-07-18: 2 [IU] via SUBCUTANEOUS
  Administered 2019-07-18: 1 [IU] via SUBCUTANEOUS
  Administered 2019-07-19 – 2019-07-20 (×4): 2 [IU] via SUBCUTANEOUS
  Administered 2019-07-21: 3 [IU] via SUBCUTANEOUS
  Administered 2019-07-21: 1 [IU] via SUBCUTANEOUS
  Administered 2019-07-21: 5 [IU] via SUBCUTANEOUS
  Administered 2019-07-22: 3 [IU] via SUBCUTANEOUS
  Administered 2019-07-22 (×2): 2 [IU] via SUBCUTANEOUS
  Administered 2019-07-22 – 2019-07-23 (×3): 3 [IU] via SUBCUTANEOUS
  Administered 2019-07-23: 5 [IU] via SUBCUTANEOUS
  Administered 2019-07-23: 3 [IU] via SUBCUTANEOUS
  Administered 2019-07-24: 5 [IU] via SUBCUTANEOUS
  Administered 2019-07-24: 2 [IU] via SUBCUTANEOUS
  Administered 2019-07-24: 3 [IU] via SUBCUTANEOUS
  Administered 2019-07-24: 2 [IU] via SUBCUTANEOUS
  Administered 2019-07-25: 1 [IU] via SUBCUTANEOUS
  Administered 2019-07-25: 2 [IU] via SUBCUTANEOUS
  Administered 2019-07-25: 1 [IU] via SUBCUTANEOUS
  Administered 2019-07-25: 3 [IU] via SUBCUTANEOUS
  Administered 2019-07-25 – 2019-07-26 (×2): 2 [IU] via SUBCUTANEOUS
  Administered 2019-07-26: 3 [IU] via SUBCUTANEOUS
  Administered 2019-07-26: 1 [IU] via SUBCUTANEOUS
  Administered 2019-07-26 – 2019-07-27 (×3): 2 [IU] via SUBCUTANEOUS
  Administered 2019-07-27: 1 [IU] via SUBCUTANEOUS
  Administered 2019-07-28: 2 [IU] via SUBCUTANEOUS

## 2019-07-13 MED ORDER — VANCOMYCIN HCL 1500 MG/300ML IV SOLN: 1500.0000 mg | INTRAVENOUS | Status: DC

## 2019-07-13 MED ORDER — HYDROMORPHONE HCL 1 MG/ML IJ SOLN
0.5000 mg | INTRAMUSCULAR | Status: DC | PRN
  Administered 2019-07-14 – 2019-07-27 (×21): 0.5 mg via INTRAVENOUS
  Filled 2019-07-13 (×21): qty 1

## 2019-07-13 MED ORDER — POLYETHYLENE GLYCOL 3350 17 G PO PACK: 17.0000 g | PACK | Freq: Every day | ORAL | Status: DC | PRN

## 2019-07-13 MED ORDER — BUPROPION HCL ER (SR) 150 MG PO TB12
150.0000 mg | ORAL_TABLET | Freq: Two times a day (BID) | ORAL | Status: DC
  Administered 2019-07-14 – 2019-07-28 (×26): 150 mg via ORAL
  Filled 2019-07-13 (×32): qty 1

## 2019-07-13 MED ORDER — VITAMIN K1 10 MG/ML IJ SOLN
5.0000 mg | Freq: Once | INTRAVENOUS | Status: AC
  Administered 2019-07-14: 5 mg via INTRAVENOUS
  Filled 2019-07-13: qty 0.5

## 2019-07-13 MED ORDER — SODIUM CHLORIDE 0.9% FLUSH
3.0000 mL | Freq: Once | INTRAVENOUS | Status: AC
  Administered 2019-07-13: 3 mL via INTRAVENOUS

## 2019-07-13 MED ORDER — FENTANYL CITRATE (PF) 100 MCG/2ML IJ SOLN
50.0000 ug | Freq: Once | INTRAMUSCULAR | Status: AC
  Administered 2019-07-13: 50 ug via INTRAVENOUS
  Filled 2019-07-13: qty 2

## 2019-07-13 MED ORDER — METRONIDAZOLE IN NACL 5-0.79 MG/ML-% IV SOLN
500.0000 mg | Freq: Three times a day (TID) | INTRAVENOUS | Status: DC
  Administered 2019-07-14: 500 mg via INTRAVENOUS
  Filled 2019-07-13: qty 100

## 2019-07-13 MED ORDER — SEVELAMER CARBONATE 800 MG PO TABS
2400.0000 mg | ORAL_TABLET | Freq: Three times a day (TID) | ORAL | Status: DC
  Administered 2019-07-14 – 2019-07-28 (×33): 2400 mg via ORAL
  Filled 2019-07-13 (×35): qty 3

## 2019-07-13 MED ORDER — SODIUM CHLORIDE 0.9 % IV SOLN
2.0000 g | INTRAVENOUS | Status: DC
  Filled 2019-07-13: qty 2

## 2019-07-13 MED ORDER — PREGABALIN 50 MG PO CAPS
50.0000 mg | ORAL_CAPSULE | Freq: Every day | ORAL | Status: DC
  Administered 2019-07-13 – 2019-07-27 (×15): 50 mg via ORAL
  Filled 2019-07-13 (×15): qty 1

## 2019-07-13 MED ORDER — IOHEXOL 300 MG/ML  SOLN
75.0000 mL | Freq: Once | INTRAMUSCULAR | Status: AC | PRN
  Administered 2019-07-13: 75 mL via INTRAVENOUS

## 2019-07-13 MED ORDER — SODIUM CHLORIDE 0.9 % IV SOLN: INTRAVENOUS | Status: AC

## 2019-07-13 MED ORDER — VANCOMYCIN HCL IN DEXTROSE 1-5 GM/200ML-% IV SOLN
1000.0000 mg | Freq: Once | INTRAVENOUS | Status: DC
  Filled 2019-07-13: qty 200

## 2019-07-13 MED ORDER — INSULIN ASPART 100 UNIT/ML ~~LOC~~ SOLN: 0.0000 [IU] | SUBCUTANEOUS | Status: DC

## 2019-07-13 MED ORDER — ATORVASTATIN CALCIUM 40 MG PO TABS
40.0000 mg | ORAL_TABLET | Freq: Every day | ORAL | Status: DC
  Administered 2019-07-14 – 2019-07-27 (×14): 40 mg via ORAL
  Filled 2019-07-13 (×14): qty 1

## 2019-07-13 MED ORDER — METRONIDAZOLE IN NACL 5-0.79 MG/ML-% IV SOLN
500.0000 mg | Freq: Once | INTRAVENOUS | Status: AC
  Administered 2019-07-13: 500 mg via INTRAVENOUS
  Filled 2019-07-13: qty 100

## 2019-07-13 MED ORDER — ACETAMINOPHEN 325 MG PO TABS: 650.0000 mg | ORAL_TABLET | Freq: Four times a day (QID) | ORAL | Status: DC | PRN

## 2019-07-13 NOTE — Progress Notes (Signed)
Notified provider of need to draw lactic acid.
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
## 2019-07-13 NOTE — Progress Notes (Signed)
Pharmacy Antibiotic Note  Max Pittman. is a 44 y.o. male admitted on 07/13/2019 with sepsis.  Pharmacy has been consulted for cefepime and vancomycin dosing.  Plan: Vancomycin 1500mg  IV every 48 hours.  Goal trough 15-20 mcg/mL. cefepime 2gm iv q24h  Height: 5\' 9"  (175.3 cm) Weight: 116 kg (255 lb 11.7 oz) IBW/kg (Calculated) : 70.7  Temp (24hrs), Avg:99.5 F (37.5 C), Min:99.4 F (37.4 C), Max:99.5 F (37.5 C)  Recent Labs  Lab 07/13/19 1405 07/13/19 1615  WBC 13.2*  --   CREATININE 8.23*  --   LATICACIDVEN 3.0* 3.3*    Estimated Creatinine Clearance: 14.5 mL/min (A) (by C-G formula based on SCr of 8.23 mg/dL (H)).    Allergies  Allergen Reactions  . Doxycycline Hives  . Latex Swelling    Pt reports swelling at site.   . Morphine And Related Anxiety    Antimicrobials this admission: 6/17 cefepime >>  6/17 vancomycin >>    Microbiology results: 6/17 BCx: sent  6/17 UCx: sent   Thank you for allowing pharmacy to be a part of this patient's care.  Donna Christen Saniya Tranchina 07/13/2019 5:27 PM

## 2019-07-13 NOTE — ED Triage Notes (Signed)
Pt sent here for elevated WBC and possible blood infection.  Increased left hip pain, recent hip replacement.  Pt does peritoneal dialysis every night and once during the day at home.  Fever noted by [facility] em[facility] 99.5 120/76 118 97% RA CBG 488

## 2019-07-14 ENCOUNTER — Encounter (HOSPITAL_COMMUNITY)
Admission: AD | Disposition: A | Discharge status: Home Health | Payer: Self-pay | Source: Home / Self Care | Attending: Internal Medicine

## 2019-07-14 ENCOUNTER — Encounter (HOSPITAL_COMMUNITY): Payer: Self-pay | Admitting: Internal Medicine

## 2019-07-14 ENCOUNTER — Inpatient Hospital Stay (HOSPITAL_COMMUNITY): Payer: Medicare Other | Admitting: Anesthesiology

## 2019-07-14 DIAGNOSIS — B9561 Methicillin susceptible Staphylococcus aureus infection as the cause of diseases classified elsewhere: Secondary | ICD-10-CM

## 2019-07-14 DIAGNOSIS — E1159 Type 2 diabetes mellitus with other circulatory complications: Secondary | ICD-10-CM

## 2019-07-14 DIAGNOSIS — R7881 Bacteremia: Secondary | ICD-10-CM

## 2019-07-14 DIAGNOSIS — Z992 Dependence on renal dialysis: Secondary | ICD-10-CM

## 2019-07-14 DIAGNOSIS — Z7901 Long term (current) use of anticoagulants: Secondary | ICD-10-CM

## 2019-07-14 DIAGNOSIS — L02416 Cutaneous abscess of left lower limb: Secondary | ICD-10-CM

## 2019-07-14 DIAGNOSIS — A419 Sepsis, unspecified organism: Secondary | ICD-10-CM

## 2019-07-14 HISTORY — PX: INCISION AND DRAINAGE HIP: SHX1801

## 2019-07-14 LAB — BASIC METABOLIC PANEL
Anion gap: 16 — ABNORMAL HIGH (ref 5–15)
BUN: 46 mg/dL — ABNORMAL HIGH (ref 6–20)
CO2: 22 mmol/L (ref 22–32)
Calcium: 8.6 mg/dL — ABNORMAL LOW (ref 8.9–10.3)
Chloride: 95 mmol/L — ABNORMAL LOW (ref 98–111)
Creatinine, Ser: 10.23 mg/dL — ABNORMAL HIGH (ref 0.61–1.24)
GFR calc Af Amer: 6 mL/min — ABNORMAL LOW (ref >60)
GFR calc non Af Amer: 6 mL/min — ABNORMAL LOW (ref >60)
Glucose, Bld: 103 mg/dL — ABNORMAL HIGH (ref 70–99)
Potassium: 4 mmol/L (ref 3.5–5.1)
Sodium: 133 mmol/L — ABNORMAL LOW (ref 135–145)

## 2019-07-14 LAB — BLOOD CULTURE ID PANEL (REFLEXED)

## 2019-07-14 LAB — GLUCOSE, CAPILLARY
Glucose-Capillary: 107 mg/dL — ABNORMAL HIGH (ref 70–99)
Glucose-Capillary: 115 mg/dL — ABNORMAL HIGH (ref 70–99)
Glucose-Capillary: 123 mg/dL — ABNORMAL HIGH (ref 70–99)
Glucose-Capillary: 127 mg/dL — ABNORMAL HIGH (ref 70–99)
Glucose-Capillary: 150 mg/dL — ABNORMAL HIGH (ref 70–99)

## 2019-07-14 LAB — PROTIME-INR
INR: 1.7 — ABNORMAL HIGH (ref 0.8–1.2)
Prothrombin Time: 18.9 seconds — ABNORMAL HIGH (ref 11.4–15.2)

## 2019-07-14 SURGERY — IRRIGATION AND DEBRIDEMENT HIP
Anesthesia: General | Site: Hip | Laterality: Left

## 2019-07-14 MED ORDER — CEFAZOLIN SODIUM-DEXTROSE 2-4 GM/100ML-% IV SOLN
2.0000 g | INTRAVENOUS | Status: DC
  Filled 2019-07-14: qty 100

## 2019-07-14 MED ORDER — HEPARIN 1000 UNIT/ML FOR PERITONEAL DIALYSIS
INTRAPERITONEAL | Status: DC | PRN
  Filled 2019-07-14: qty 5000

## 2019-07-14 MED ORDER — DELFLEX-LC/1.5% DEXTROSE 344 MOSM/L IP SOLN
INTRAPERITONEAL | Status: DC
  Administered 2019-07-15: 5000 mL via INTRAPERITONEAL

## 2019-07-14 MED ORDER — DELFLEX-LC/2.5% DEXTROSE 394 MOSM/L IP SOLN
INTRAPERITONEAL | Status: DC
  Administered 2019-07-15 – 2019-07-19 (×3): 5000 mL via INTRAPERITONEAL
  Administered 2019-07-27: 15000 mL via INTRAPERITONEAL

## 2019-07-14 MED ORDER — ONDANSETRON HCL 4 MG/2ML IJ SOLN: 4.0000 mg | Freq: Once | INTRAMUSCULAR | Status: DC | PRN

## 2019-07-14 MED ORDER — POVIDONE-IODINE 10 % EX SWAB: 2.0000 "application " | Freq: Once | CUTANEOUS | Status: DC

## 2019-07-14 MED ORDER — CHLORHEXIDINE GLUCONATE 0.12 % MT SOLN
OROMUCOSAL | Status: AC
  Administered 2019-07-14: 15 mL via OROMUCOSAL
  Filled 2019-07-14: qty 15

## 2019-07-14 MED ORDER — SODIUM CHLORIDE 0.9 % IR SOLN
Status: DC | PRN
  Administered 2019-07-14: 3000 mL

## 2019-07-14 MED ORDER — LIDOCAINE HCL (CARDIAC) PF 100 MG/5ML IV SOSY
PREFILLED_SYRINGE | INTRAVENOUS | Status: DC | PRN
  Administered 2019-07-14: 30 mg via INTRAVENOUS

## 2019-07-14 MED ORDER — PROPOFOL 10 MG/ML IV BOLUS
INTRAVENOUS | Status: AC
  Filled 2019-07-14: qty 20

## 2019-07-14 MED ORDER — SODIUM CHLORIDE 0.9 % IV SOLN: INTRAVENOUS | Status: DC

## 2019-07-14 MED ORDER — PHENYLEPHRINE HCL (PRESSORS) 10 MG/ML IV SOLN
INTRAVENOUS | Status: DC | PRN
Start: 2019-07-14 — End: 2019-07-14
  Administered 2019-07-14 (×2): 120 ug via INTRAVENOUS

## 2019-07-14 MED ORDER — CHLORHEXIDINE GLUCONATE CLOTH 2 % EX PADS
6.0000 | MEDICATED_PAD | Freq: Every day | CUTANEOUS | Status: DC
  Administered 2019-07-14 – 2019-07-28 (×12): 6 via TOPICAL

## 2019-07-14 MED ORDER — CHLORHEXIDINE GLUCONATE 4 % EX LIQD: 60.0000 mL | Freq: Once | CUTANEOUS | Status: DC

## 2019-07-14 MED ORDER — 0.9 % SODIUM CHLORIDE (POUR BTL) OPTIME
TOPICAL | Status: DC | PRN
  Administered 2019-07-14: 1000 mL

## 2019-07-14 MED ORDER — SODIUM CHLORIDE 0.9 % IV SOLN
INTRAVENOUS | Status: DC | PRN
Start: 2019-07-14 — End: 2019-07-14

## 2019-07-14 MED ORDER — ALBUMIN HUMAN 5 % IV SOLN
INTRAVENOUS | Status: DC | PRN
Start: 2019-07-14 — End: 2019-07-14

## 2019-07-14 MED ORDER — CHLORHEXIDINE GLUCONATE 0.12 % MT SOLN: 15.0000 mL | Freq: Once | OROMUCOSAL | Status: AC

## 2019-07-14 MED ORDER — MIDAZOLAM HCL 2 MG/2ML IJ SOLN
INTRAMUSCULAR | Status: AC
  Filled 2019-07-14: qty 2

## 2019-07-14 MED ORDER — FENTANYL CITRATE (PF) 250 MCG/5ML IJ SOLN
INTRAMUSCULAR | Status: AC
  Filled 2019-07-14: qty 5

## 2019-07-14 MED ORDER — CEFAZOLIN SODIUM-DEXTROSE 1-4 GM/50ML-% IV SOLN
1.0000 g | INTRAVENOUS | Status: DC
  Administered 2019-07-14: 2 g via INTRAVENOUS
  Administered 2019-07-15 – 2019-07-28 (×14): 1 g via INTRAVENOUS
  Filled 2019-07-14 (×17): qty 50

## 2019-07-14 MED ORDER — METOPROLOL TARTRATE 5 MG/5ML IV SOLN
5.0000 mg | Freq: Once | INTRAVENOUS | Status: AC
  Administered 2019-07-14: 5 mg via INTRAVENOUS
  Filled 2019-07-14: qty 5

## 2019-07-14 MED ORDER — ORAL CARE MOUTH RINSE: 15.0000 mL | Freq: Once | OROMUCOSAL | Status: AC

## 2019-07-14 MED ORDER — HEPARIN 1000 UNIT/ML FOR PERITONEAL DIALYSIS: 500.0000 [IU] | INTRAMUSCULAR | Status: DC | PRN

## 2019-07-14 MED ORDER — HYDROCODONE-ACETAMINOPHEN 5-325 MG PO TABS
1.0000 | ORAL_TABLET | Freq: Four times a day (QID) | ORAL | Status: DC | PRN
  Administered 2019-07-15 – 2019-07-28 (×26): 2 via ORAL
  Filled 2019-07-14 (×28): qty 2

## 2019-07-14 MED ORDER — FENTANYL CITRATE (PF) 100 MCG/2ML IJ SOLN: 25.0000 ug | INTRAMUSCULAR | Status: DC | PRN

## 2019-07-14 MED ORDER — FENTANYL CITRATE (PF) 100 MCG/2ML IJ SOLN
INTRAMUSCULAR | Status: DC | PRN
  Administered 2019-07-14: 50 ug via INTRAVENOUS

## 2019-07-14 MED ORDER — PROPOFOL 10 MG/ML IV BOLUS
INTRAVENOUS | Status: DC | PRN
  Administered 2019-07-14: 150 mg via INTRAVENOUS

## 2019-07-14 MED ORDER — GENTAMICIN SULFATE 0.1 % EX CREA
1.0000 "application " | TOPICAL_CREAM | Freq: Every day | CUTANEOUS | Status: DC
  Administered 2019-07-15 – 2019-07-27 (×9): 1 via TOPICAL
  Filled 2019-07-14 (×2): qty 15

## 2019-07-14 SURGICAL SUPPLY — 58 items
BRUSH FEMORAL CANAL (MISCELLANEOUS) IMPLANT
BRUSH SCRUB EZ PLAIN DRY (MISCELLANEOUS) ×4 IMPLANT
CANISTER WOUNDNEG PRESSURE 500 (CANNISTER) ×2 IMPLANT
CASSETTE VERAFLO VERALINK (MISCELLANEOUS) ×2 IMPLANT
COVER SURGICAL LIGHT HANDLE (MISCELLANEOUS) ×4 IMPLANT
COVER WAND RF STERILE (DRAPES) ×2 IMPLANT
DRAPE C-ARMOR (DRAPES) ×2 IMPLANT
DRAPE IMP U-DRAPE 54X76 (DRAPES) ×2 IMPLANT
DRAPE INCISE IOBAN 66X45 STRL (DRAPES) IMPLANT
DRAPE ORTHO SPLIT 77X108 STRL (DRAPES) ×4
DRAPE SURG ORHT 6 SPLT 77X108 (DRAPES) ×2 IMPLANT
DRAPE U-SHAPE 47X51 STRL (DRAPES) ×2 IMPLANT
DRESSING VERAFLO CLEANSE CC (GAUZE/BANDAGES/DRESSINGS) ×2 IMPLANT
DRSG MEPITEL 4X7.2 (GAUZE/BANDAGES/DRESSINGS) ×2 IMPLANT
DRSG PAD ABDOMINAL 8X10 ST (GAUZE/BANDAGES/DRESSINGS) ×2 IMPLANT
DRSG VERAFLO CLEANSE CC (GAUZE/BANDAGES/DRESSINGS) ×4
ELECT BLADE 6.5 EXT (BLADE) ×2 IMPLANT
ELECT CAUTERY BLADE 6.4 (BLADE) ×2 IMPLANT
ELECT REM PT RETURN 9FT ADLT (ELECTROSURGICAL) ×2
ELECTRODE REM PT RTRN 9FT ADLT (ELECTROSURGICAL) ×1 IMPLANT
EVACUATOR 1/8 PVC DRAIN (DRAIN) IMPLANT
GAUZE SPONGE 4X4 12PLY STRL (GAUZE/BANDAGES/DRESSINGS) ×2 IMPLANT
GAUZE SPONGE 4X4 16PLY XRAY LF (GAUZE/BANDAGES/DRESSINGS) ×2 IMPLANT
GAUZE XEROFORM 5X9 LF (GAUZE/BANDAGES/DRESSINGS) ×2 IMPLANT
GLOVE BIO SURGEON STRL SZ7.5 (GLOVE) ×2 IMPLANT
GLOVE BIO SURGEON STRL SZ8 (GLOVE) ×2 IMPLANT
GLOVE BIOGEL PI IND STRL 7.5 (GLOVE) ×1 IMPLANT
GLOVE BIOGEL PI IND STRL 8 (GLOVE) ×1 IMPLANT
GLOVE BIOGEL PI INDICATOR 7.5 (GLOVE) ×1
GLOVE BIOGEL PI INDICATOR 8 (GLOVE) ×1
GOWN STRL REUS W/ TWL XL LVL3 (GOWN DISPOSABLE) ×1 IMPLANT
GOWN STRL REUS W/TWL 2XL LVL3 (GOWN DISPOSABLE) ×6 IMPLANT
GOWN STRL REUS W/TWL XL LVL3 (GOWN DISPOSABLE) ×2
HANDPIECE INTERPULSE COAX TIP (DISPOSABLE)
KIT TURNOVER KIT B (KITS) ×2 IMPLANT
MANIFOLD NEPTUNE II (INSTRUMENTS) ×2 IMPLANT
NEEDLE 22X1 1/2 (OR ONLY) (NEEDLE) IMPLANT
NS IRRIG 1000ML POUR BTL (IV SOLUTION) ×2 IMPLANT
PACK TOTAL JOINT (CUSTOM PROCEDURE TRAY) ×2 IMPLANT
PACK UNIVERSAL I (CUSTOM PROCEDURE TRAY) ×2 IMPLANT
PAD ARMBOARD 7.5X6 YLW CONV (MISCELLANEOUS) ×4 IMPLANT
PILLOW ABDUCTION MEDIUM (MISCELLANEOUS) IMPLANT
SET HNDPC FAN SPRY TIP SCT (DISPOSABLE) IMPLANT
SET IRRIG Y TYPE TUR BLADDER L (SET/KITS/TRAYS/PACK) ×2 IMPLANT
SPONGE LAP 18X18 RF (DISPOSABLE) IMPLANT
STAPLER VISISTAT 35W (STAPLE) ×2 IMPLANT
SUT VIC AB 0 CT1 27 (SUTURE) ×4
SUT VIC AB 0 CT1 27XBRD ANBCTR (SUTURE) ×2 IMPLANT
SUT VIC AB 1 CT1 27 (SUTURE) ×2
SUT VIC AB 1 CT1 27XBRD ANBCTR (SUTURE) ×1 IMPLANT
SUT VIC AB 2-0 CT1 27 (SUTURE) ×2
SUT VIC AB 2-0 CT1 TAPERPNT 27 (SUTURE) ×1 IMPLANT
SYR CONTROL 10ML LL (SYRINGE) ×2 IMPLANT
TOWEL GREEN STERILE (TOWEL DISPOSABLE) ×4 IMPLANT
TOWEL GREEN STERILE FF (TOWEL DISPOSABLE) ×2 IMPLANT
TOWER CARTRIDGE SMART MIX (DISPOSABLE) IMPLANT
TRAY FOLEY MTR SLVR 16FR STAT (SET/KITS/TRAYS/PACK) IMPLANT
WATER STERILE IRR 1000ML POUR (IV SOLUTION) ×8 IMPLANT

## 2019-07-14 NOTE — Plan of Care (Signed)
  Problem: Education: Goal: Knowledge of General Education information will improve Description: Including pain rating scale, medication(s)/side effects and non-pharmacologic comfort measures Outcome: Completed/Met   Problem: Health Behavior/Discharge Planning: Goal: Ability to manage health-related needs will improve Outcome: Progressing   Problem: Clinical Measurements: Goal: Ability to maintain clinical measurements within normal limits will improve Outcome: Progressing Goal: Will remain free from infection Outcome: Progressing Goal: Diagnostic test results will improve Outcome: Progressing Goal: Respiratory complications will improve Outcome: Progressing

## 2019-07-14 NOTE — Consult Note (Signed)
Reason for Consult:Left hip fluid collection Referring Physician: A Feliz Alfard Cochrane. is an 44 y.o. male.  HPI: Milt c/o ~3d hx/o left hip pain, weakness, and fevers. He had measured fevers of up to 101.6 at home. He had some lab work that showed a mildly elevated WBC count and he was directed to come in for admission. Workup showed a staph bacteremia. Orthopedic surgery was consulted. He is s/p acet repair in April. He never followed up with the surgeon. He notes he's had pain in the hip since the surgery but it got much worse Monday/Tuesday and he's been just about unable to walk. The knee is also hurting but not nearly as bad.  Past Medical History:  Diagnosis Date  . Arthritis   . Closed dislocation of left hip ([place]) 05/21/2019  . Diabetes ([place])   . Diabetes mellitus without complication ([place])   . History of peritoneal dialysis   . Hypertension   . Renal disorder    FFGS  . Renal disorder   . Stroke [facility])    when ha was a child  . Vasovagal syndrome    with syncope    Past Surgical History:  Procedure Laterality Date  . APPLICATION OF WOUND VAC Left 05/22/2019   Procedure: APPLICATION OF WOUND VAC  LEFT HIP;  Surgeon: Altamese [place], MD;  Location: [place];  Service: Orthopedics;  Laterality: Left;  . AV FISTULA INSERTION W/ RF MAGNETIC GUIDANCE Left   . AV FISTULA PLACEMENT    . BUBBLE STUDY  05/25/2019   Procedure: BUBBLE STUDY;  Surgeon: Dorothy Spark, MD;  Location: [place];  Service: Cardiovascular;;  . CHOLECYSTECTOMY    . HERNIA REPAIR    . HIP CLOSED REDUCTION Left 05/20/2019   Procedure: CLOSED REDUCTION HIP WITH TRACTION PIN APPLICATION;  Surgeon: Altamese [place], MD;  Location: [place];  Service: Orthopedics;  Laterality: Left;  . IR FLUORO GUIDE CV LINE RIGHT  05/23/2019  . IR US GUIDE VASC ACCESS RIGHT  05/23/2019  . LEFT HEART CATH AND CORONARY ANGIOGRAPHY N/A 03/22/2019   Procedure: LEFT HEART CATH AND CORONARY ANGIOGRAPHY;  Surgeon: Wellington Hampshire, MD;  Location: [place] CV LAB;  Service: Cardiovascular;  Laterality: N/A;  . LOOP RECORDER INSERTION N/A 05/25/2019   Procedure: LOOP RECORDER INSERTION;  Surgeon: [name], MD;  Location: [place] CV LAB;  Service: Cardiovascular;  Laterality: N/A;  . ORIF ACETABULAR FRACTURE Left 05/22/2019   Procedure: OPEN REDUCTION INTERNAL FIXATION (ORIF) T TYPE  WITH ASSOCIATED POSTERIOR WALL ACETABULAR FRACTURE, LEFT; REMOVAL OF TRACTION PIN LEFT TIBIA;  Surgeon: Altamese [place], MD;  Location: [place];  Service: Orthopedics;  Laterality: Left;  . TEE WITHOUT CARDIOVERSION N/A 05/25/2019   Procedure: TRANSESOPHAGEAL ECHOCARDIOGRAM (TEE);  Surgeon: Dorothy Spark, MD;  Location: [facility] ENDOSCOPY;  Service: Cardiovascular;  Laterality: N/A;  . TONSILLECTOMY      Family History  Problem Relation Age of Onset  . Heart disease Mother   . Diabetes Mother   . Hypertension Mother   . Diabetes Father   . Hypertension Father   . CAD Mother        "angina"  . Hypercholesterolemia Mother   . Hypercholesterolemia Father   . Healthy Brother     Social History:  reports that he quit smoking about 7 months ago. He has never used smokeless tobacco. He reports that he does not drink alcohol and does not use drugs.  Allergies:  Allergies  Allergen  Reactions  . Doxycycline Hives  . Latex Swelling    Pt reports swelling at site.   . Morphine And Related Anxiety    Medications: I have reviewed the patient's current medications.  Results for orders placed or performed during the hospital encounter of 07/13/19 (from the past 48 hour(s))  Lactic acid, plasma     Status: Abnormal   Collection Time: 07/13/19  2:05 PM  Result Value Ref Range   Lactic Acid, Venous 3.0 (HH) 0.5 - 1.9 mmol/L    Comment: CRITICAL RESULT CALLED TO, READ BACK BY AND VERIFIED WITH: WILLEY,E AT 1630 ON 6.17.21 BY ISLEY,B Performed at [facility], [address], [place], [place] [address]   Comprehensive  metabolic panel     Status: Abnormal   Collection Time: 07/13/19  2:05 PM  Result Value Ref Range   Sodium 127 (L) 135 - 145 mmol/L   Potassium 3.7 3.5 - 5.1 mmol/L   Chloride 87 (L) 98 - 111 mmol/L   CO2 25 22 - 32 mmol/L   Glucose, Bld 443 (H) 70 - 99 mg/dL    Comment: Glucose reference range applies only to samples taken after fasting for at least 8 hours.   BUN 32 (H) 6 - 20 mg/dL   Creatinine, Ser 8.23 (H) 0.61 - 1.24 mg/dL   Calcium 8.8 (L) 8.9 - 10.3 mg/dL   Total Protein 8.1 6.5 - 8.1 g/dL   Albumin 2.2 (L) 3.5 - 5.0 g/dL   AST 14 (L) 15 - 41 U/L   ALT 11 0 - 44 U/L   Alkaline Phosphatase 122 38 - 126 U/L   Total Bilirubin 0.6 0.3 - 1.2 mg/dL   GFR calc non Af Amer 7 (L) >60 mL/min   GFR calc Af Amer 8 (L) >60 mL/min   Anion gap 15 5 - 15    Comment: Performed at [facility], [address] [place], [place] [address]  CBC with Differential     Status: Abnormal   Collection Time: 07/13/19  2:05 PM  Result Value Ref Range   WBC 13.2 (H) 4.0 - 10.5 K/uL   RBC 3.60 (L) 4.22 - 5.81 MIL/uL   Hemoglobin 10.0 (L) 13.0 - 17.0 g/dL   HCT 33.3 (L) 39 - 52 %   MCV 92.5 80.0 - 100.0 fL   MCH 27.8 26.0 - 34.0 pg   MCHC 30.0 30.0 - 36.0 g/dL   RDW 15.9 (H) 11.5 - 15.5 %   Platelets 296 150 - 400 K/uL   nRBC 0.0 0.0 - 0.2 %   Neutrophils Relative % 81 %   Neutro Abs 10.6 (H) 1.7 - 7.7 K/uL   Lymphocytes Relative 8 %   Lymphs Abs 1.0 0.7 - 4.0 K/uL   Monocytes Relative 10 %   Monocytes Absolute 1.4 (H) 0 - 1 K/uL   Eosinophils Relative 0 %   Eosinophils Absolute 0.0 0 - 0 K/uL   Basophils Relative 0 %   Basophils Absolute 0.0 0 - 0 K/uL   Immature Granulocytes 1 %   Abs Immature Granulocytes 0.19 (H) 0.00 - 0.07 K/uL    Comment: Performed at [facility], [address], [place], [place] [address]  APTT     Status: Abnormal   Collection Time: 07/13/19  2:05 PM  Result Value Ref Range   aPTT 66 (H) 24 - 36 seconds    Comment:        IF BASELINE aPTT IS ELEVATED, SUGGEST  PATIENT RISK  ASSESSMENT BE USED TO DETERMINE APPROPRIATE ANTICOAGULANT THERAPY. Performed at [facility], [address] [place], [place] [address]   Protime-INR     Status: Abnormal   Collection Time: 07/13/19  2:05 PM  Result Value Ref Range   Prothrombin Time 27.2 (H) 11.4 - 15.2 seconds   INR 2.6 (H) 0.8 - 1.2    Comment: (NOTE) INR goal varies based on device and disease states. Performed at [facility], [address], [place], [place] [address]   Blood culture (routine x 2)     Status: None (Preliminary result)   Collection Time: 07/13/19  2:06 PM   Specimen: BLOOD RIGHT ARM  Result Value Ref Range   Specimen Description      BLOOD RIGHT ARM Performed at [facility], [address] [place], [place] [address]    Special Requests      BOTTLES DRAWN AEROBIC AND ANAEROBIC Blood Culture adequate volume Performed at [facility] [place], [place] [address]    Culture  Setup Time      IN BOTH AEROBIC AND ANAEROBIC BOTTLES GRAM POSITIVE COCCI Gram Stain Report Called to,Read Back By and Verified With: C MARSHALL,RN @0311  07/14/19 MKELLY CRITICAL RESULT CALLED TO, READ BACK BY AND VERIFIED WITH: Roxy Manns  [id number] FCP Performed at [facility], [place] [address], [place], [place] [address]    Culture GRAM POSITIVE COCCI    Report Status PENDING   Blood Culture ID Panel (Reflexed)     Status: Abnormal   Collection Time: 07/13/19  2:06 PM  Result Value Ref Range   Enterococcus species NOT DETECTED NOT DETECTED   Listeria monocytogenes NOT DETECTED NOT DETECTED   Staphylococcus species DETECTED (A) NOT DETECTED    Comment: CRITICAL RESULT CALLED TO, READ BACK BY AND VERIFIED WITH: Roxy Manns  [id number] FCP    Staphylococcus aureus (BCID) DETECTED (A) NOT DETECTED    Comment: Methicillin (oxacillin) susceptible Staphylococcus aureus (MSSA). Preferred therapy is anti staphylococcal beta lactam antibiotic (Cefazolin or Nafcillin), unless  clinically contraindicated. CRITICAL RESULT CALLED TO, READ BACK BY AND VERIFIED WITH: Roxy Manns  [id number] FCP    Methicillin resistance NOT DETECTED NOT DETECTED   Streptococcus species NOT DETECTED NOT DETECTED   Streptococcus agalactiae NOT DETECTED NOT DETECTED   Streptococcus pneumoniae NOT DETECTED NOT DETECTED   Streptococcus pyogenes NOT DETECTED NOT DETECTED   Acinetobacter baumannii NOT DETECTED NOT DETECTED   Enterobacteriaceae species NOT DETECTED NOT DETECTED   Enterobacter cloacae complex NOT DETECTED NOT DETECTED   Escherichia coli NOT DETECTED NOT DETECTED   Klebsiella oxytoca NOT DETECTED NOT DETECTED   Klebsiella pneumoniae NOT DETECTED NOT DETECTED   Proteus species NOT DETECTED NOT DETECTED   Serratia marcescens NOT DETECTED NOT DETECTED   Haemophilus influenzae NOT DETECTED NOT DETECTED   Neisseria meningitidis NOT DETECTED NOT DETECTED   Pseudomonas aeruginosa NOT DETECTED NOT DETECTED   Candida albicans NOT DETECTED NOT DETECTED   Candida glabrata NOT DETECTED NOT DETECTED   Candida krusei NOT DETECTED NOT DETECTED   Candida parapsilosis NOT DETECTED NOT DETECTED   Candida tropicalis NOT DETECTED NOT DETECTED    Comment: Performed at [facility] [address], [place],  [address]  Blood culture (routine x 2)     Status: None (Preliminary result)   Collection Time: 07/13/19  3:02 PM   Specimen: BLOOD RIGHT ARM  Result Value Ref Range   Specimen Description  BLOOD RIGHT ARM    Special Requests      BOTTLES DRAWN AEROBIC AND ANAEROBIC Blood Culture adequate volume   Culture  Setup Time      AEROBIC BOTTLE ONLY GRAM POSITIVE COCCI Gram Stain Report Called to,Read Back By and Verified With: C MARSHALL,RN @0439  07/14/19 MKELLY GRAM POSITIVE COCCI ANAEROBIC BOTTLE ONLY RESULT PREV. CALLED     Culture      NO GROWTH < 24 HOURS Performed at [facility], [address], [place], [place] [address]    Report Status PENDING   Lactic  acid, plasma     Status: Abnormal   Collection Time: 07/13/19  4:15 PM  Result Value Ref Range   Lactic Acid, Venous 3.3 (HH) 0.5 - 1.9 mmol/L    Comment: CRITICAL VALUE NOTED.  VALUE IS CONSISTENT WITH PREVIOUSLY REPORTED AND CALLED VALUE. Performed at [facility], [address] [place], [place] [address]   SARS Coronavirus 2 by RT PCR (hospital order, performed in [facility] hospital lab) Nasopharyngeal Nasopharyngeal Swab     Status: None   Collection Time: 07/13/19  6:10 PM   Specimen: Nasopharyngeal Swab  Result Value Ref Range   SARS Coronavirus 2 NEGATIVE NEGATIVE    Comment: (NOTE) SARS-CoV-2 target nucleic acids are NOT DETECTED.  The SARS-CoV-2 RNA is generally detectable in upper and lower respiratory specimens during the acute phase of infection. The lowest concentration of SARS-CoV-2 viral copies this assay can detect is 250 copies / mL. A negative result does not preclude SARS-CoV-2 infection and should not be used as the sole basis for treatment or other patient management decisions.  A negative result may occur with improper specimen collection / handling, submission of specimen other than nasopharyngeal swab, presence of viral mutation(s) within the areas targeted by this assay, and inadequate number of viral copies (<250 copies / mL). A negative result must be combined with clinical observations, patient history, and epidemiological information.  Fact Sheet for Patients:   StrictlyIdeas.no  Fact Sheet for Healthcare Providers: BankingDealers.co.za  This test is not yet approved or  cleared by the [place] FDA and has been authorized for detection and/or diagnosis of SARS-CoV-2 by FDA under an Emergency Use Authorization (EUA).  This EUA will remain in effect (meaning this test can be used) for the duration of the COVID-19 declaration under Section 564(b)(1) of the Act, 21 U.S.C. section 360bbb-3(b)(1),  unless the authorization is terminated or revoked sooner.  Performed at [facility], [address] [place], [place] [address]   Lactic acid, plasma     Status: Abnormal   Collection Time: 07/13/19  8:19 PM  Result Value Ref Range   Lactic Acid, Venous 2.6 (HH) 0.5 - 1.9 mmol/L    Comment: CRITICAL VALUE NOTED.  VALUE IS CONSISTENT WITH PREVIOUSLY REPORTED AND CALLED VALUE. Performed at [facility], [address], [place], [place] [address]   Glucose, capillary     Status: Abnormal   Collection Time: 07/13/19 10:40 PM  Result Value Ref Range   Glucose-Capillary 174 (H) 70 - 99 mg/dL    Comment: Glucose reference range applies only to samples taken after fasting for at least 8 hours.  Glucose, capillary     Status: Abnormal   Collection Time: 07/14/19  2:15 AM  Result Value Ref Range   Glucose-Capillary 150 (H) 70 - 99 mg/dL    Comment: Glucose reference range applies only to samples taken after fasting for at least 8 hours.  Glucose, capillary  Status: Abnormal   Collection Time: 07/14/19  6:13 AM  Result Value Ref Range   Glucose-Capillary 127 (H) 70 - 99 mg/dL    Comment: Glucose reference range applies only to samples taken after fasting for at least 8 hours.  Protime-INR     Status: Abnormal   Collection Time: 07/14/19  8:41 AM  Result Value Ref Range   Prothrombin Time 18.9 (H) 11.4 - 15.2 seconds   INR 1.7 (H) 0.8 - 1.2    Comment: (NOTE) INR goal varies based on device and disease states. Performed at [facility], [place] [address] [place], [place] [address]     DG Chest 2 View  Result Date: 07/13/2019 CLINICAL DATA:  Fever, elevated white blood cell count EXAM: CHEST - 2 VIEW COMPARISON:  06/30/2019 FINDINGS: Right IJ hemodialysis catheter terminating at the level of the distal SVC, unchanged. Left chest wall loop recorder device. Stable mild cardiomegaly. Mild vascular congestion without focal airspace consolidation or overt edema. IMPRESSION: Mild  pulmonary vascular congestion without focal airspace consolidation. Electronically Signed   By: Davina Poke D.O.   On: 07/13/2019 14:23   CT HIP LEFT W CONTRAST  Result Date: 07/13/2019 CLINICAL DATA:  Worsening hip pain after surgery in April EXAM: CT OF THE LOWER LEFT EXTREMITY WITH CONTRAST TECHNIQUE: Multidetector CT imaging of the lower left extremity was performed according to the standard protocol following intravenous contrast administration. COMPARISON:  07/13/2019, 06/15/2019 CONTRAST:  32mL OMNIPAQUE IOHEXOL 300 MG/ML  SOLN FINDINGS: Bones/Joint/Cartilage Status post ORIF of comminuted left acetabular fracture with near anatomic alignment and similar alignment of fracture fragments as compared with the previous CT. Hardware appears intact. Fracture lucencies remain visible, no solid callus identified. Small to moderate hip effusion, now containing gas locules. Small osseous fragments within the joint fluid and inferior to the right hip as before. No bony destructive change at the femur. Ligaments Suboptimally assessed by CT. Muscles and Tendons .  Mild edema within the intermuscular fat planes. Soft tissues Increased size of a subcutaneous fluid collection within the left lateral hip, deep to the incision site. This measures 7.7 cm transverse by 3.3 cm AP on axial images and about 13.9 cm craniocaudad on coronal views. Numerous gas bubbles within the fluid collection. Mild rim enhancement. Possible involvement of left lateral gluteus musculature by the fluid collection. IMPRESSION: 1. Increased size of a subcutaneous fluid collection within the left lateral hip deep to the incision site, now measuring 7.7 x 3.3 x 13.9 cm, and now demonstrating numerous internal gas bubbles and rim enhancement concerning for a soft tissue abscess. There is potential involvement of left lateral gluteus musculature. Increased hip effusion, now small moderate in size, with numerous gas bubbles present in the hip  effusion, concerning for infection/septic hip. No gross osseous destructive change at this time. 2. Status post ORIF of comminuted left acetabular fracture with stable alignment of hardware and fracture fragments. Electronically Signed   By: Donavan Foil M.D.   On: 07/13/2019 20:50   DG Hip Unilat W or Wo Pelvis 2-3 Views Left  Result Date: 07/13/2019 CLINICAL DATA:  Pain. Previous surgical repair for acetabular fracture EXAM: DG HIP (WITH OR WITHOUT PELVIS) 2-3V LEFT COMPARISON:  CT left hip Jun 16, 2019 FINDINGS: Frontal pelvis as well as frontal and lateral left hip images were obtained. There is screw and plate fixation through acetabular fractures on the left with alignment near anatomic. No acute fracture or dislocation. There is mild symmetric narrowing of  each hip joint. No erosive change. IMPRESSION: Postoperative change left hip joint with near anatomic alignment at fracture fracture sites. Note very little callus formation in visualized fracture site areas. No acute fracture or dislocation. Symmetric narrowing each hip joint. Electronically Signed   By: Lowella Grip III M.D.   On: 07/13/2019 18:33    Review of Systems  Constitutional: Positive for fatigue and fever. Negative for chills and diaphoresis.  HENT: Negative for ear discharge, ear pain, hearing loss and tinnitus.   Eyes: Negative for photophobia and pain.  Respiratory: Negative for cough and shortness of breath.   Cardiovascular: Negative for chest pain.  Gastrointestinal: Negative for abdominal pain, nausea and vomiting.  Genitourinary: Negative for dysuria, flank pain, frequency and urgency.  Musculoskeletal: Positive for arthralgias (Left hip/knee). Negative for back pain, myalgias and neck pain.  Neurological: Negative for dizziness and headaches.  Hematological: Does not bruise/bleed easily.  Psychiatric/Behavioral: The patient is not nervous/anxious.    Blood pressure (!) 153/77, pulse (!) 105, temperature 99 F  (37.2 C), temperature source Oral, resp. rate 18, height 5\' 9"  (1.753 m), weight 116 kg, SpO2 93 %. Physical Exam  Constitutional: He appears well-developed. No distress.  HENT:  Head: Normocephalic and atraumatic.  Eyes: Conjunctivae are normal. Right eye exhibits no discharge. Left eye exhibits no discharge. No scleral icterus.  Cardiovascular: Normal rate and regular rhythm.  Respiratory: Effort normal. No respiratory distress.  Musculoskeletal:     Cervical back: Normal range of motion.     Comments: LLE No traumatic wounds, ecchymosis, or rash, incision still with sutures, erythematous with bloody discharge, skin indurated  Mild TTP  No knee or ankle effusion, able to range 180-135  Knee stable to varus/ valgus and anterior/posterior stress  Sens DPN, SPN, TN intact  Motor EHL, ext, flex, evers 5/5  DP 1+, PT 1+, No significant edema  Neurological: He is alert.  Skin: Skin is warm and dry. He is not diaphoretic.  Psychiatric: His behavior is normal.    Assessment/Plan: Left hip fluid collection -- Will take to OR for I&D by Dr. Marcelino Scot later today. Please keep NPO. Multiple medical problems including ESRD on peritoneal dialysis, diabetes mellitus, hypertension, and DVT on coumadin -- per primary service, INR 1.7 this morning so should be ok for surgery.    Lisette Abu, PA-C Orthopedic Surgery [phone] 07/14/2019, 10:16 AM

## 2019-07-14 NOTE — Anesthesia Preprocedure Evaluation (Addendum)
Anesthesia Evaluation  Patient identified by MRN, date of birth, ID band Patient awake    Reviewed: Allergy & Precautions, H&P , NPO status , Patient's Chart, lab work & pertinent test results  Airway Mallampati: III  TM Distance: >3 FB Neck ROM: Full    Dental  (+) Teeth Intact, Dental Advisory Given   Pulmonary sleep apnea , former smoker,    Pulmonary exam normal        Cardiovascular hypertension, Pt. on medications and Pt. on home beta blockers Normal cardiovascular exam   '21 TEE - EF 60 to 65%. Left atrial size was mildly dilated. Mild mitral valve regurgitation.     Neuro/Psych CVA, No Residual Symptoms negative psych ROS   GI/Hepatic negative GI ROS, Neg liver ROS,   Endo/Other  diabetes, Type 2, Oral Hypoglycemic AgentsMorbid obesity Hyponatremia   Renal/GU ESRF and DialysisRenal disease     Musculoskeletal  (+) Arthritis ,   Abdominal (+) + obese,   Peds  Hematology  (+) anemia ,  INR 1.7 On coumadin    Anesthesia Other Findings Covid test negative   Reproductive/Obstetrics                            Anesthesia Physical  Anesthesia Plan  ASA: III  Anesthesia Plan: General   Post-op Pain Management:    Induction: Intravenous  PONV Risk Score and Plan: 3 and Ondansetron, Dexamethasone, Midazolam and Treatment may vary due to age or medical condition  Airway Management Planned: LMA  Additional Equipment: None  Intra-op Plan:   Post-operative Plan: Extubation in OR  Informed Consent: I have reviewed the patients History and Physical, chart, labs and discussed the procedure including the risks, benefits and alternatives for the proposed anesthesia with the patient or authorized representative who has indicated his/her understanding and acceptance.     Dental advisory given  Plan Discussed with: CRNA and Anesthesiologist  Anesthesia Plan Comments:         Anesthesia Quick Evaluation
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
## 2019-07-14 NOTE — Consult Note (Signed)
[facility]    Date of Admission:  07/13/2019           Reason for Consult: MSSA bacteremia     Referring Provider: Auto-consultation  Primary Care Provider: Chesley Noon, MD    Assessment: Max Pittman. is a 44 y.o. male admitted for work up of fever and leukocytosis. Found to have large left hip effusion with abscess and MSSA bacteremia. Will narrow antibiotics to Cefazolin alone to treat.  Follow for intraoperative cultures taken from hip +/- knee. Left knee appears fairly benign today without effusion, erythema or swelling. Discussed with orthopedics at the bedside and may consider aspiration in OR if concerning but likely watch and wait for now. Going to the OR about 3pm for washout of the hip - given hardware in place would normally recommend addition of rifampin, however his warfarin and variable INRs I don't think warrant the risk of interaction.   Will check repeat blood cultures in AM and order TTE. He has a soft systolic murmur at the left sternal boarder; TEE in April 2021 indicates mild MR at that time (done for stroke work up). Will check TTE this weekend.   His right pointer finger has ulcerated wound with surrounding erythema and some tenderness. After surgery would start with x-ray to assess for any underlying osteomyelitis given tenderness and chronic duration.   Dr. Johnnye Sima will follow up with him this weekend.    Plan: 1. Narrow to Cefazolin IV  2. Follow micro from L hip +/- knee   3. Repeat blood cultures in AM 4. ESR / CRP in AM  5. TTE  6. Xray of R hand in AM    Principal Problem:   MSSA bacteremia Active Problems:   Abscess of left hip   Peritoneal dialysis status ([place])   Diabetes mellitus ([place])   Multiple closed pelvic fractures with disruption of pelvic circle (HCC)   Diabetic peripheral neuropathy (HCC)   Sepsis (HCC)   Chronic anticoagulation   . atorvastatin  40 mg Oral q1800  . buPROPion   150 mg Oral BID WC  . Chlorhexidine Gluconate Cloth  6 each Topical Daily  . insulin aspart  0-9 Units Subcutaneous Q6H  . pregabalin  50 mg Oral QHS  . sevelamer carbonate  2,400 mg Oral TID WC    HPI: Max Pittman. is a 44 y.o. male admitted to the hospital for concern over leukocytosis and fever. He has a history of ESRD on peritoneal dialysis, CVA, HTN, T2DM, ORIF for comminuted acetabular fracture 05/22/2019.  Recently discharged from the hospital on 07/06/2019 for acute pulmonary edema and hypoxia in the setting of hypertensive emergency.   In the ER he was found to have WBC 13.2k, fevers at home ranging from 99.5 - 101.6 F. Complaining of left hip pain. He was started on IV vancomycin, metronidazole and cefepime following blood culture collection. COVID19 negative. CXR remarkable for pulmonary vascular congestion but no infectious changes.  CT scan of the left hip revealed:  IMPRESSION: 1. Increased size of a subcutaneous fluid collection within the left lateral hip deep to the incision site, now measuring 7.7 x 3.3 x 13.9 cm, and now demonstrating numerous internal gas bubbles and rim enhancement concerning for a soft tissue abscess. There is potential involvement of left lateral gluteus musculature. Increased hip effusion, now small moderate in size, with numerous gas bubbles present in the hip effusion, concerning for  infection/septic hip. No gross osseous destructive change at this time. 2. Status post ORIF of comminuted left acetabular fracture with stable alignment of hardware and fracture fragments.   In speaking to Abdallah he states that he had very rapid worsening of left hip pain that started at the beginning of this week. He always has some degree of pain but this was exceptionally worse to the point he was unable to walk. Today he states he mostly just feels tired and wants to sleep.  He has not followed up with Dr. Marcelino Scot since surgery in late April due to acute  stroke hospitalization + rehab stay and re-admission to the hospital for CHF overload. He still has some stitches in place to the left lateral hip incision. He has also noticed pain in the left knee that started about the same time as the left hip. No swelling from what he can tell but it just hurts to bear weight. He does not have any wounds on his feet. He does have a wound on the right pointer finger tip that has been present about 2 months; this causes him pain sometimes. Not exactly sure how that started.  He has a loop recorder in place under the skin that was implanted from the chart records in April 2021. He has not had any trouble with the pain overlying pocket.  He has a PD catheter in place that has been functioning well without pain during dwells. He has an old AVF in place to [place] that is chronically soft and elevated, non-painful.  INRs have been variable - last required vitamin K.    Review of Systems: Review of Systems  Constitutional: Positive for chills, fever and malaise/fatigue.  Eyes: Negative for blurred vision.  Respiratory: Negative for cough and shortness of breath.   Cardiovascular: Positive for leg swelling. Negative for chest pain and palpitations.  Gastrointestinal: Negative for abdominal pain, diarrhea, nausea and vomiting.  Genitourinary:       Makes very little urine - does not void daily  Musculoskeletal: Positive for joint pain. Negative for back pain (L hip, L knee).  Skin: Negative for rash.       Wound R pointer finger tip  Neurological: Positive for weakness. Negative for dizziness and headaches.    Past Medical History:  Diagnosis Date  . Arthritis   . Closed dislocation of left hip ([place]) 05/21/2019  . Diabetes ([place])   . Diabetes mellitus without complication ([place])   . History of peritoneal dialysis   . Hypertension   . Renal disorder    FFGS  . Renal disorder   . Stroke [facility])    when ha was a child  . Vasovagal syndrome    with syncope     Social History   Tobacco Use  . Smoking status: Former Smoker    Quit date: 11/16/2018    Years since quitting: 0.6  . Smokeless tobacco: Never Used  Vaping Use  . Vaping Use: Never used  Substance Use Topics  . Alcohol use: Never    Comment: occ  . Drug use: Never    Family History  Problem Relation Age of Onset  . Heart disease Mother   . Diabetes Mother   . Hypertension Mother   . Diabetes Father   . Hypertension Father   . CAD Mother        "angina"  . Hypercholesterolemia Mother   . Hypercholesterolemia Father   . Healthy Brother    Allergies  Allergen Reactions  .  Doxycycline Hives  . Latex Swelling    Pt reports swelling at site.   . Morphine And Related Anxiety    OBJECTIVE: Blood pressure (!) 153/77, pulse (!) 105, temperature 99 F (37.2 C), temperature source Oral, resp. rate 18, height 5' 9"  (1.753 m), weight 116 kg, SpO2 93 %.  Physical Exam Vitals and nursing note reviewed.  Constitutional:      Appearance: He is well-developed.     Comments: Asleep during visit - awakens easily and resting in bed comfortably   HENT:     Mouth/Throat:     Mouth: Mucous membranes are moist.     Dentition: Normal dentition. No dental abscesses.     Pharynx: Oropharynx is clear.  Eyes:     General: No scleral icterus.    Pupils: Pupils are equal, round, and reactive to light.  Cardiovascular:     Rate and Rhythm: Normal rate and regular rhythm.     Heart sounds: Murmur (soft systolic 2-5/9@ LSB) heard.   Pulmonary:     Effort: Pulmonary effort is normal.     Breath sounds: Normal breath sounds.  Abdominal:     General: There is no distension.     Palpations: Abdomen is soft.     Tenderness: There is no abdominal tenderness.  Musculoskeletal:     Cervical back: Normal range of motion. No tenderness.     Left hip: Tenderness present. Deformity: embedded sutures in place  Decreased range of motion.     Right knee: No swelling, effusion or bony  tenderness. Normal range of motion.     Left knee: No swelling, effusion or bony tenderness. Normal range of motion.  Skin:    General: Skin is warm and dry.     Findings: No rash.  Neurological:     Mental Status: He is alert and oriented to person, place, and time.  Psychiatric:        Judgment: Judgment normal.     Comments: In good spirits today and engaged in care discussion.      Lab Results Lab Results  Component Value Date   WBC 13.2 (H) 07/13/2019   HGB 10.0 (L) 07/13/2019   HCT 33.3 (L) 07/13/2019   MCV 92.5 07/13/2019   PLT 296 07/13/2019    Lab Results  Component Value Date   CREATININE 8.23 (H) 07/13/2019   BUN 32 (H) 07/13/2019   NA 127 (L) 07/13/2019   K 3.7 07/13/2019   CL 87 (L) 07/13/2019   CO2 25 07/13/2019    Lab Results  Component Value Date   ALT 11 07/13/2019   AST 14 (L) 07/13/2019   ALKPHOS 122 07/13/2019   BILITOT 0.6 07/13/2019     Microbiology: Recent Results (from the past 240 hour(s))  Blood culture (routine x 2)     Status: None (Preliminary result)   Collection Time: 07/13/19  2:06 PM   Specimen: BLOOD RIGHT ARM  Result Value Ref Range Status   Specimen Description   Final    BLOOD RIGHT ARM Performed at [facility], [address] [place], [place] [address]    Special Requests   Final    BOTTLES DRAWN AEROBIC AND ANAEROBIC Blood Culture adequate volume Performed at [facility], [address], [place],  [address]    Culture  Setup Time   Final    IN BOTH AEROBIC AND ANAEROBIC BOTTLES GRAM POSITIVE COCCI Gram Stain Report Called to,Read Back By and Verified With: C MARSHALL,RN @0311  07/14/19  MKELLY CRITICAL RESULT CALLED TO, READ BACK BY AND VERIFIED WITH: Roxy Manns  [id number] FCP Performed at [facility], [place] [address] [place], [place] [address]    Culture GRAM POSITIVE COCCI  Final   Report Status PENDING  Incomplete  Blood Culture ID Panel (Reflexed)     Status: Abnormal   Collection Time:  07/13/19  2:06 PM  Result Value Ref Range Status   Enterococcus species NOT DETECTED NOT DETECTED Final   Listeria monocytogenes NOT DETECTED NOT DETECTED Final   Staphylococcus species DETECTED (A) NOT DETECTED Final    Comment: CRITICAL RESULT CALLED TO, READ BACK BY AND VERIFIED WITH: Roxy Manns  [id number] FCP    Staphylococcus aureus (BCID) DETECTED (A) NOT DETECTED Final    Comment: Methicillin (oxacillin) susceptible Staphylococcus aureus (MSSA). Preferred therapy is anti staphylococcal beta lactam antibiotic (Cefazolin or Nafcillin), unless clinically contraindicated. CRITICAL RESULT CALLED TO, READ BACK BY AND VERIFIED WITH: Roxy Manns  [id number] FCP    Methicillin resistance NOT DETECTED NOT DETECTED Final   Streptococcus species NOT DETECTED NOT DETECTED Final   Streptococcus agalactiae NOT DETECTED NOT DETECTED Final   Streptococcus pneumoniae NOT DETECTED NOT DETECTED Final   Streptococcus pyogenes NOT DETECTED NOT DETECTED Final   Acinetobacter baumannii NOT DETECTED NOT DETECTED Final   Enterobacteriaceae species NOT DETECTED NOT DETECTED Final   Enterobacter cloacae complex NOT DETECTED NOT DETECTED Final   Escherichia coli NOT DETECTED NOT DETECTED Final   Klebsiella oxytoca NOT DETECTED NOT DETECTED Final   Klebsiella pneumoniae NOT DETECTED NOT DETECTED Final   Proteus species NOT DETECTED NOT DETECTED Final   Serratia marcescens NOT DETECTED NOT DETECTED Final   Haemophilus influenzae NOT DETECTED NOT DETECTED Final   Neisseria meningitidis NOT DETECTED NOT DETECTED Final   Pseudomonas aeruginosa NOT DETECTED NOT DETECTED Final   Candida albicans NOT DETECTED NOT DETECTED Final   Candida glabrata NOT DETECTED NOT DETECTED Final   Candida krusei NOT DETECTED NOT DETECTED Final   Candida parapsilosis NOT DETECTED NOT DETECTED Final   Candida tropicalis NOT DETECTED NOT DETECTED Final    Comment: Performed at [facility], [address]  [place], [place] [address]  Blood culture (routine x 2)     Status: None (Preliminary result)   Collection Time: 07/13/19  3:02 PM   Specimen: BLOOD RIGHT ARM  Result Value Ref Range Status   Specimen Description BLOOD RIGHT ARM  Final   Special Requests   Final    BOTTLES DRAWN AEROBIC AND ANAEROBIC Blood Culture adequate volume   Culture  Setup Time   Final    AEROBIC BOTTLE ONLY GRAM POSITIVE COCCI Gram Stain Report Called to,Read Back By and Verified With: C MARSHALL,RN @0439  07/14/19 MKELLY GRAM POSITIVE COCCI ANAEROBIC BOTTLE ONLY RESULT PREV. CALLED     Culture   Final    NO GROWTH < 24 HOURS Performed at [facility], [address], [place], [place] [address]    Report Status PENDING  Incomplete  SARS Coronavirus 2 by RT PCR (hospital order, performed in [facility] hospital lab) Nasopharyngeal Nasopharyngeal Swab     Status: None   Collection Time: 07/13/19  6:10 PM   Specimen: Nasopharyngeal Swab  Result Value Ref Range Status   SARS Coronavirus 2 NEGATIVE NEGATIVE Final    Comment: (NOTE) SARS-CoV-2 target nucleic acids are NOT DETECTED.  The SARS-CoV-2 RNA is generally detectable in upper and lower respiratory  specimens during the acute phase of infection. The lowest concentration of SARS-CoV-2 viral copies this assay can detect is 250 copies / mL. A negative result does not preclude SARS-CoV-2 infection and should not be used as the sole basis for treatment or other patient management decisions.  A negative result may occur with improper specimen collection / handling, submission of specimen other than nasopharyngeal swab, presence of viral mutation(s) within the areas targeted by this assay, and inadequate number of viral copies (<250 copies / mL). A negative result must be combined with clinical observations, patient history, and epidemiological information.  Fact Sheet for Patients:   StrictlyIdeas.no  Fact Sheet for Healthcare  Providers: BankingDealers.co.za  This test is not yet approved or  cleared by the [place] FDA and has been authorized for detection and/or diagnosis of SARS-CoV-2 by FDA under an Emergency Use Authorization (EUA).  This EUA will remain in effect (meaning this test can be used) for the duration of the COVID-19 declaration under Section 564(b)(1) of the Act, 21 U.S.C. section 360bbb-3(b)(1), unless the authorization is terminated or revoked sooner.  Performed at [facility], [address], [place], [place] [address]     Janene Madeira, MSN, NP-C [facility] [email or web address] Pager: [phone] Office: [phone] [place]: [phone]

## 2019-07-14 NOTE — Progress Notes (Signed)
Pharmacy Antibiotic Note  Max Pittman. is a 44 y.o. male admitted on 07/13/2019 with sepsis.  BCID is positive for MSSA. Abx are being changed to cefazolin. Pt is on PD.   Plan: Dc vanc/cefepime/flagyl Cefazolin 1g IV q24  Height: 5\' 9"  (175.3 cm) Weight: 116 kg (255 lb 11.7 oz) IBW/kg (Calculated) : 70.7  Temp (24hrs), Avg:99.9 F (37.7 C), Min:99.4 F (37.4 C), Max:100.2 F (37.9 C)  Recent Labs  Lab 07/13/19 1405 07/13/19 1615 07/13/19 2019  WBC 13.2*  --   --   CREATININE 8.23*  --   --   LATICACIDVEN 3.0* 3.3* 2.6*    Estimated Creatinine Clearance: 14.5 mL/min (A) (by C-G formula based on SCr of 8.23 mg/dL (H)).    Allergies  Allergen Reactions  . Doxycycline Hives  . Latex Swelling    Pt reports swelling at site.   . Morphine And Related Anxiety    Antimicrobials this admission: 6/17 cefepime >> 6/18 6/17 vancomycin >> 6/18 6/18 cefazolin>>   Microbiology results: 6/17 BCx: 3/4 MSSA  Onnie Boer, PharmD, BCIDP, AAHIVP, CPP Infectious Disease Pharmacist 07/14/2019 9:02 AM

## 2019-07-14 NOTE — Progress Notes (Signed)
New Admission Note:   Arrival Method: care link via stretcher Mental Orientation: alert x 4 Telemetry: box 13 Assessment: Completed Skin: see flowsheet IV: intact Pain: none Tubes: none Safety Measures: Safety Fall Prevention Plan has been discussed Admission: Completed [facility] Orientation: Patient has been orientated to the room, unit and staff.  Family: at bedside  Orders have been reviewed and implemented. Will continue to monitor the patient. Call light has been placed within reach and bed alarm has been activated.   Rockie Neighbours BSN, RN Phone number: [phone]

## 2019-07-14 NOTE — Anesthesia Procedure Notes (Signed)
Procedure Name: LMA Insertion Date/Time: 07/14/2019 2:59 PM Performed by: Eligha Bridegroom, CRNA Pre-anesthesia Checklist: Patient identified, Emergency Drugs available, Suction available, Patient being monitored and Timeout performed Patient Re-evaluated:Patient Re-evaluated prior to induction Oxygen Delivery Method: Circle system utilized Preoxygenation: Pre-oxygenation with 100% oxygen Induction Type: IV induction LMA: LMA inserted LMA Size: 4.0 Placement Confirmation: positive ETCO2 and breath sounds checked- equal and bilateral Tube secured with: Tape

## 2019-07-14 NOTE — Progress Notes (Addendum)
PHARMACY - PHYSICIAN COMMUNICATION CRITICAL VALUE ALERT - BLOOD CULTURE IDENTIFICATION (BCID)  Max Pittman. is an 44 y.o. male who presented to [facility] on 07/13/2019 with a chief complaint of weakness  Assessment:  Pt with ESRD was called back and advised to come to the ED for possible infection. He was transferred from [facility] last night. He has gotten vanc/cefepime/flagyl. Lab called with BCID result of 3/4 staph aureus this AM (MSSA). He is on the ID consult list.   Name of physician (or Provider) Contacted: Aileen Fass  Current antibiotics: vanc/cefepime/flagyl  Changes to prescribed antibiotics recommended:  Change vanc/cefepime/flagyl to cefazolin 1g IV q24 (PD)  Results for orders placed or performed during the hospital encounter of 07/13/19  Blood Culture ID Panel (Reflexed) (Collected: 07/13/2019  2:06 PM)  Result Value Ref Range   Enterococcus species NOT DETECTED NOT DETECTED   Listeria monocytogenes NOT DETECTED NOT DETECTED   Staphylococcus species DETECTED (A) NOT DETECTED   Staphylococcus aureus (BCID) DETECTED (A) NOT DETECTED   Methicillin resistance NOT DETECTED NOT DETECTED   Streptococcus species NOT DETECTED NOT DETECTED   Streptococcus agalactiae NOT DETECTED NOT DETECTED   Streptococcus pneumoniae NOT DETECTED NOT DETECTED   Streptococcus pyogenes NOT DETECTED NOT DETECTED   Acinetobacter baumannii NOT DETECTED NOT DETECTED   Enterobacteriaceae species NOT DETECTED NOT DETECTED   Enterobacter cloacae complex NOT DETECTED NOT DETECTED   Escherichia coli NOT DETECTED NOT DETECTED   Klebsiella oxytoca NOT DETECTED NOT DETECTED   Klebsiella pneumoniae NOT DETECTED NOT DETECTED   Proteus species NOT DETECTED NOT DETECTED   Serratia marcescens NOT DETECTED NOT DETECTED   Haemophilus influenzae NOT DETECTED NOT DETECTED   Neisseria meningitidis NOT DETECTED NOT DETECTED   Pseudomonas aeruginosa NOT DETECTED NOT DETECTED   Candida albicans NOT DETECTED NOT  DETECTED   Candida glabrata NOT DETECTED NOT DETECTED   Candida krusei NOT DETECTED NOT DETECTED   Candida parapsilosis NOT DETECTED NOT DETECTED   Candida tropicalis NOT DETECTED NOT DETECTED

## 2019-07-14 NOTE — Transfer of Care (Signed)
Immediate Anesthesia Transfer of Care Note  Patient: Max Pittman.  Procedure(s) Performed: IRRIGATION AND DEBRIDEMENT HIP (Left Hip)  Patient Location: PACU  Anesthesia Type:General  Level of Consciousness: awake, alert  and oriented  Airway & Oxygen Therapy: Patient Spontanous Breathing  Post-op Assessment: Report given to RN and Post -op Vital signs reviewed and stable  Post vital signs: Reviewed and stable  Last Vitals:  Vitals Value Taken Time  BP 126/84 07/14/19 1628  Temp    Pulse 92 07/14/19 1631  Resp 16 07/14/19 1631  SpO2 97 % 07/14/19 1631  Vitals shown include unvalidated device data.  Last Pain:  Vitals:   07/14/19 1628  TempSrc:   PainSc: (P) 0-No pain      Patients Stated Pain Goal: 1 (91/98/02 2179)  Complications: No complications documented.

## 2019-07-15 ENCOUNTER — Inpatient Hospital Stay (HOSPITAL_COMMUNITY): Payer: Medicare Other

## 2019-07-15 DIAGNOSIS — R7881 Bacteremia: Secondary | ICD-10-CM

## 2019-07-15 LAB — PROTIME-INR
INR: 1.4 — ABNORMAL HIGH (ref 0.8–1.2)
Prothrombin Time: 17 seconds — ABNORMAL HIGH (ref 11.4–15.2)

## 2019-07-15 LAB — HEPARIN LEVEL (UNFRACTIONATED): Heparin Unfractionated: <0.1 IU/mL — ABNORMAL LOW (ref 0.30–0.70)

## 2019-07-15 LAB — GLUCOSE, CAPILLARY
Glucose-Capillary: 185 mg/dL — ABNORMAL HIGH (ref 70–99)
Glucose-Capillary: 240 mg/dL — ABNORMAL HIGH (ref 70–99)
Glucose-Capillary: 135 mg/dL — ABNORMAL HIGH (ref 70–99)

## 2019-07-15 LAB — SEDIMENTATION RATE: Sed Rate: 123 mm/hr — ABNORMAL HIGH (ref 0–16)

## 2019-07-15 LAB — C-REACTIVE PROTEIN: CRP: 35.2 mg/dL — ABNORMAL HIGH (ref <1.0)

## 2019-07-15 IMAGING — DX DG HAND 2V*R*
1 series · 2 of 2 positions shown · non-contrast
Comparison: None.

CLINICAL DATA: Rule out osteomyelitis of right second finger tip.
Infection.

EXAM:
RIGHT HAND - 2 VIEW

[Series 1: hand · 0.14mm/px · 2 of 2 slices shown]
[im 1/2]
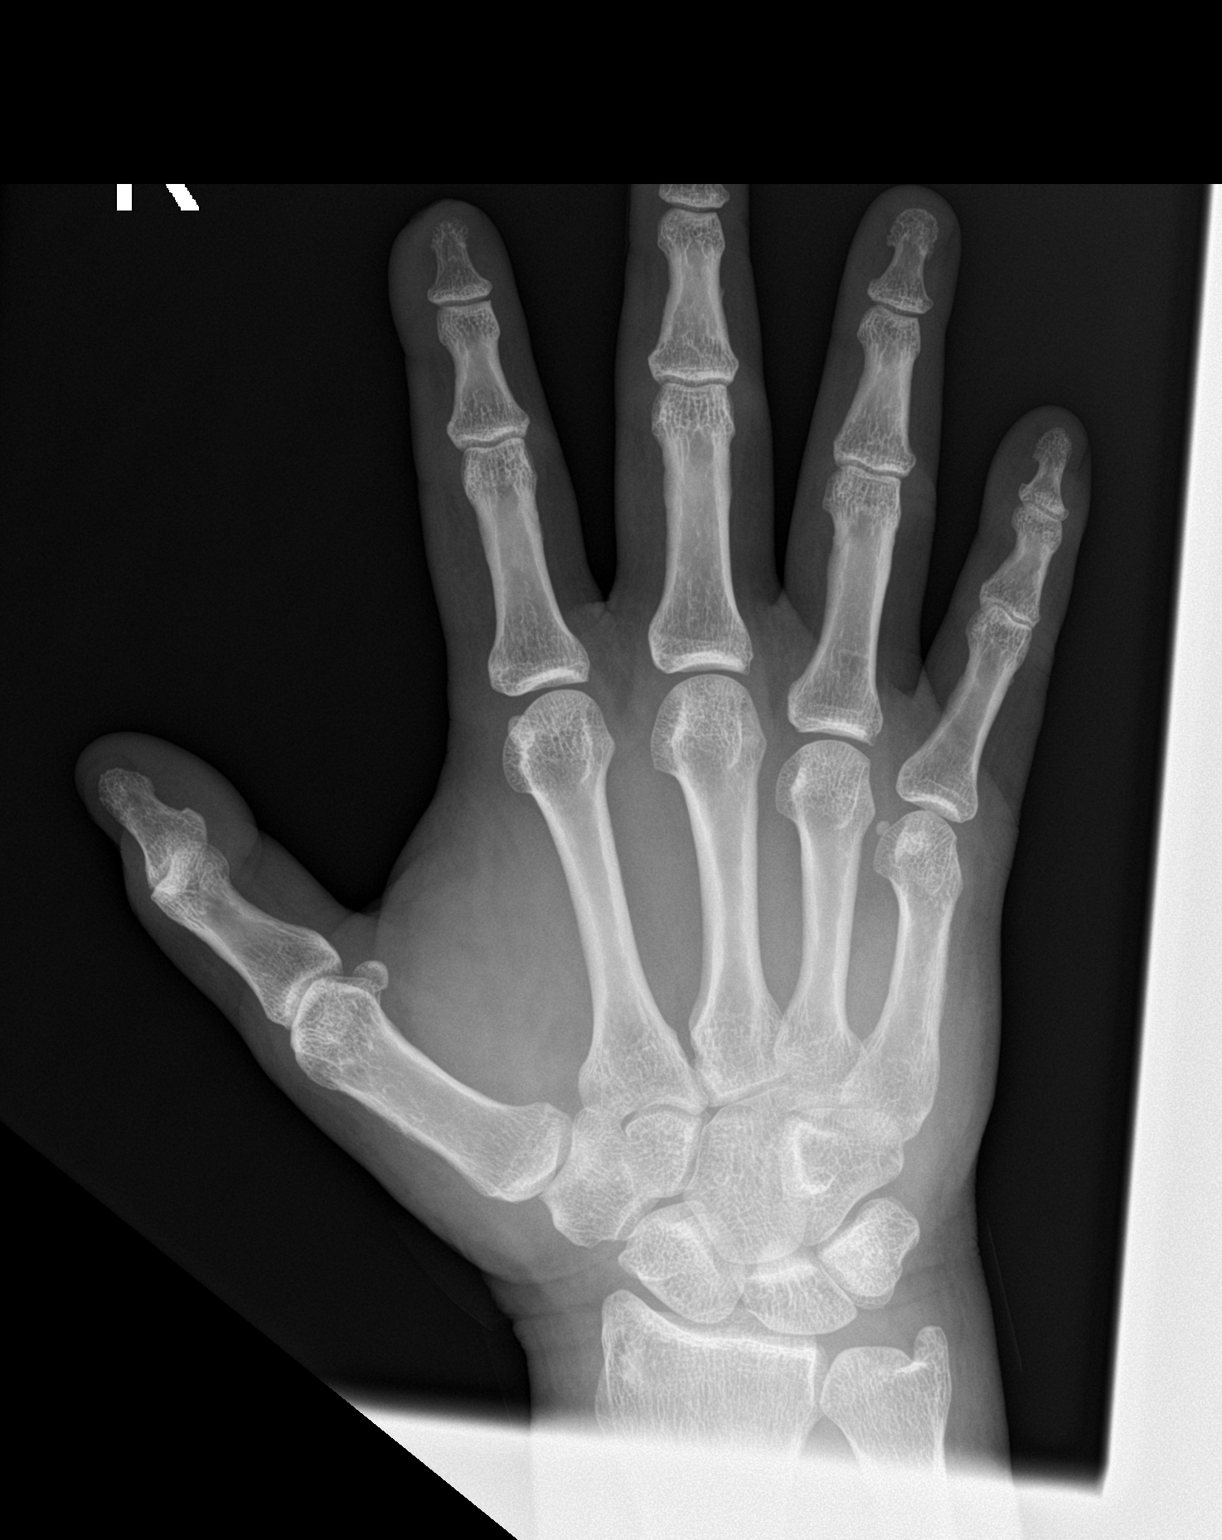
[im 2/2]
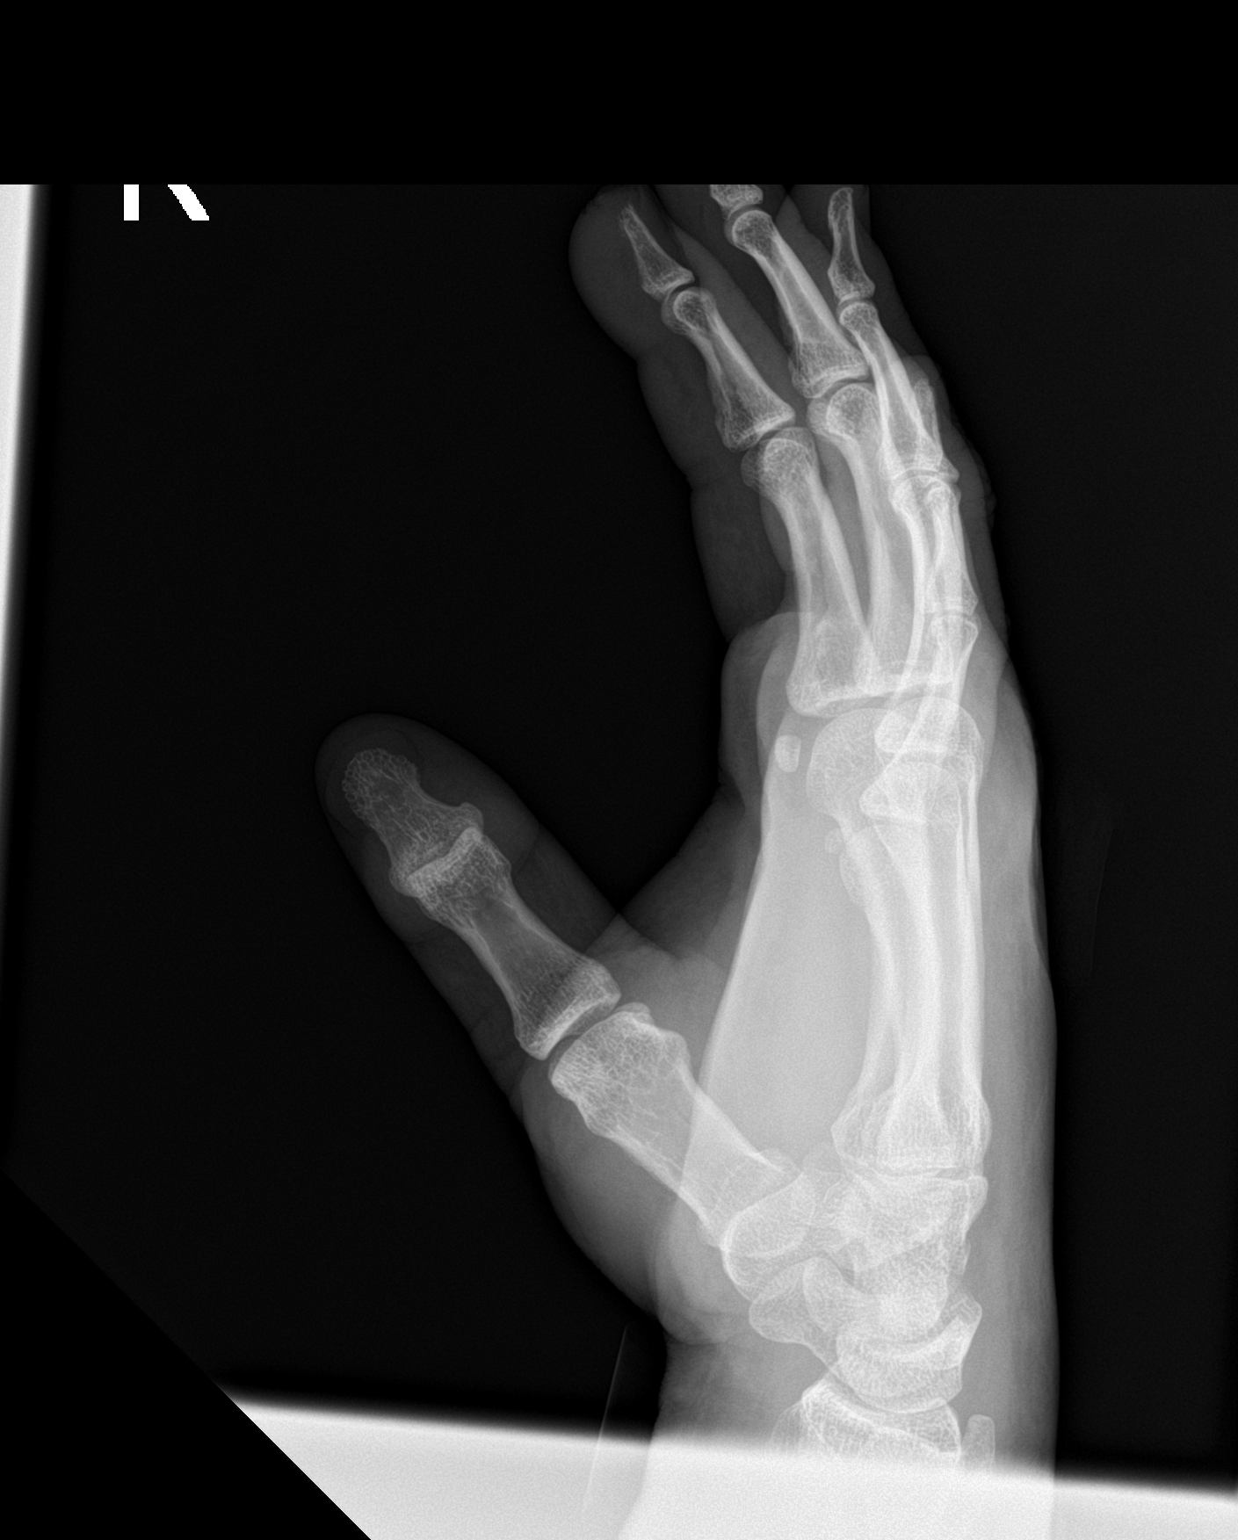

[2 of 2 positions shown; findings below may reference images not displayed]

FINDINGS: A skin defect is seen over the distal second finger. There is bony
erosion at the distal tuft of the distal second phalanx.
IMPRESSION: Osteomyelitis in the distal tuft of the distal second phalanx. No
other abnormalities.

These results will be called to the ordering clinician or
representative by the Radiologist Assistant, and communication
documented in the PACS or [REDACTED].

## 2019-07-15 IMAGING — MR MR [PERSON_NAME]*[PERSON_NAME]* W/O CM
4 of 6 series · 19 of 40 positions shown · non-contrast
Comparison: Radiographs dated [DATE]

CLINICAL DATA: Infection of the tip of the right index finger.

EXAM:
MRI OF THE RIGHT HAND WITHOUT CONTRAST
TECHNIQUE: Multiplanar, multisequence MR imaging of the right hand was
performed. No intravenous contrast was administered.

[Series 7: T2 fat-sat · coronal · 3.0mm · 0.39mm/px · 4 of 26 slices shown (1 of 2)]
[im 1/26]
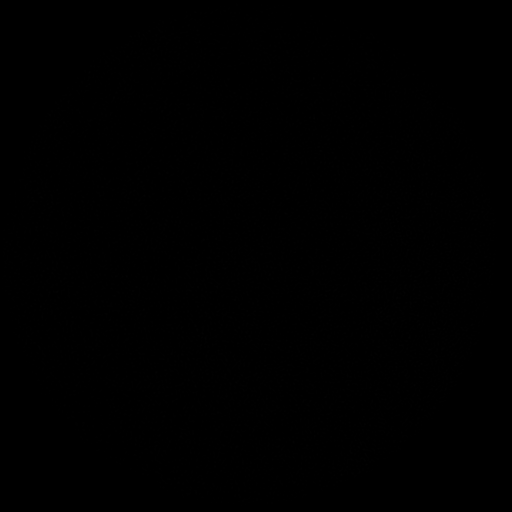
[im 9/26]
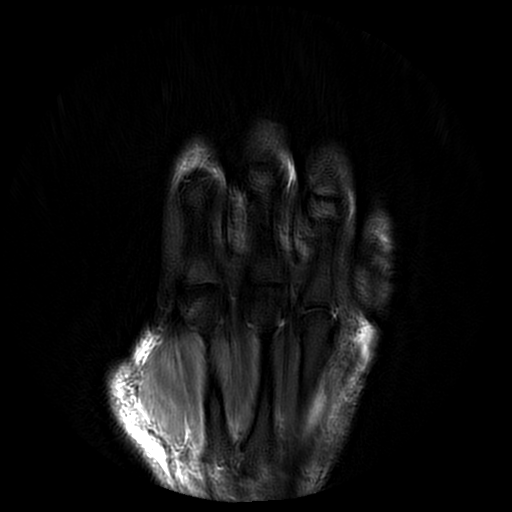
[im 17/26]
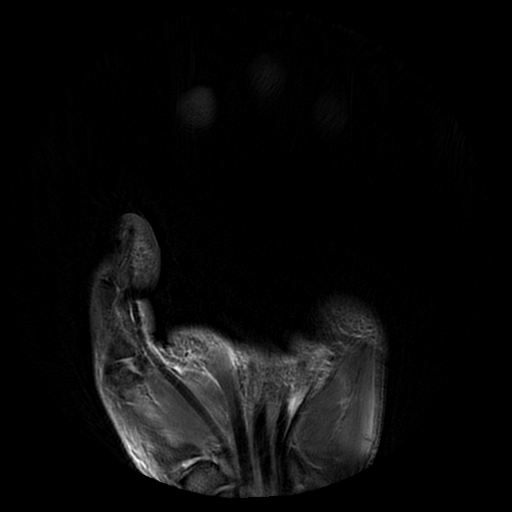
[im 26/26]
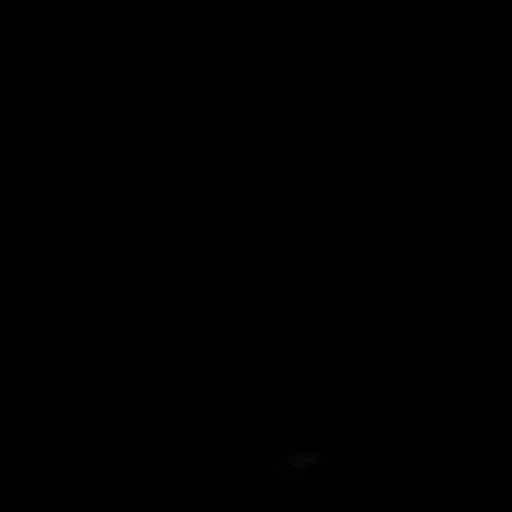

[Series 8: T2 fat-sat · axial · 3.0mm · 0.31mm/px · z∈[-14,+130]mm · 4 of 61 slices shown (2 of 2)]
[im 1/61]
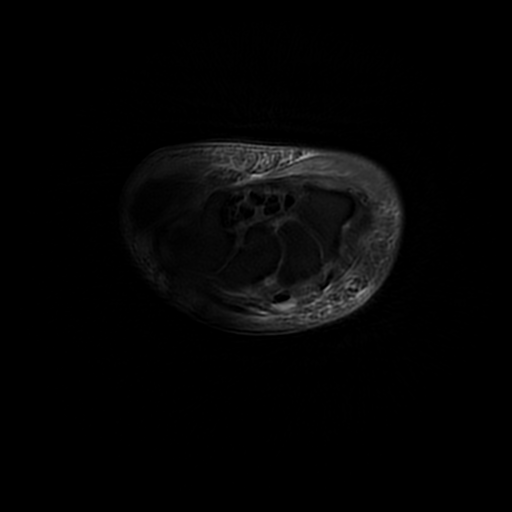
[im 8/61]
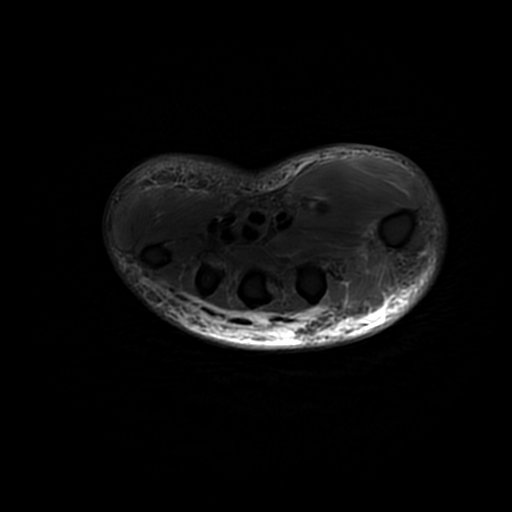
[im 31/61]
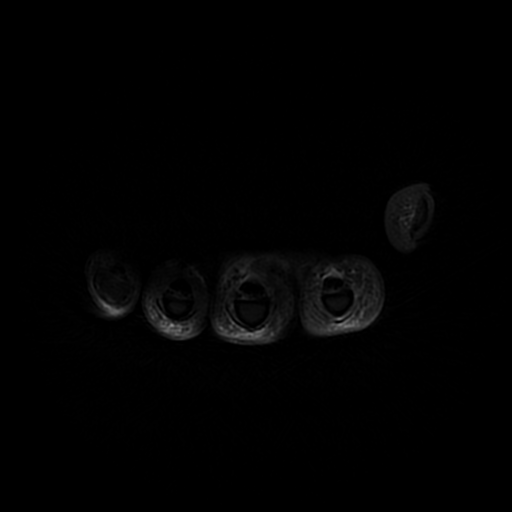
[im 53/61]
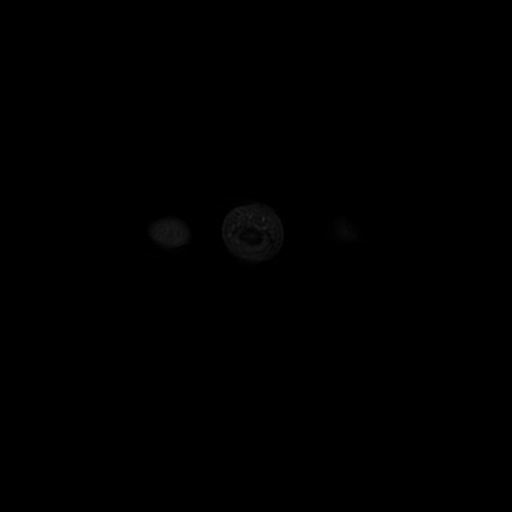

[Series 15: PD fat-sat · sagittal · 2.0mm · 0.39mm/px · 8 of 65 slices shown (1 of 2)]
[im 1/65]
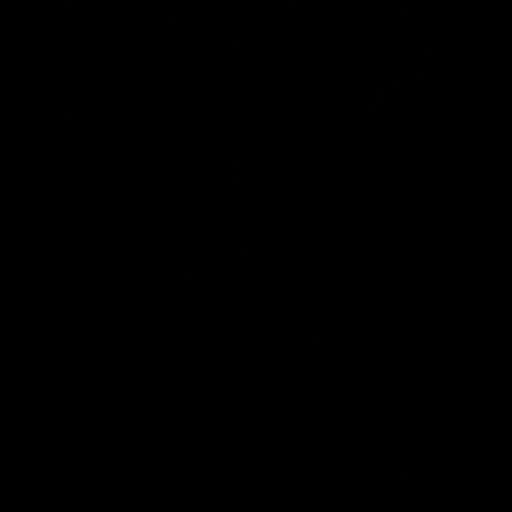
[im 8/65]
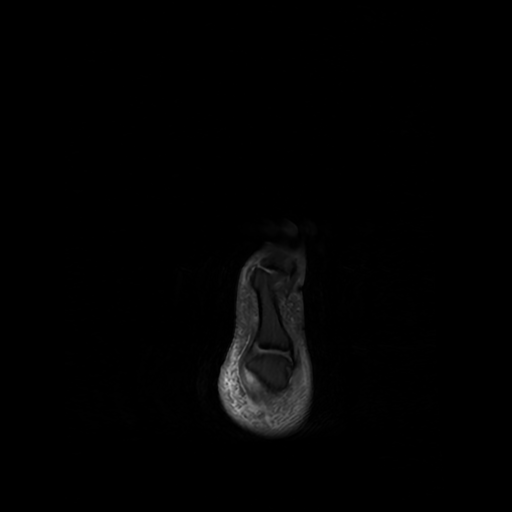
[im 22/65]
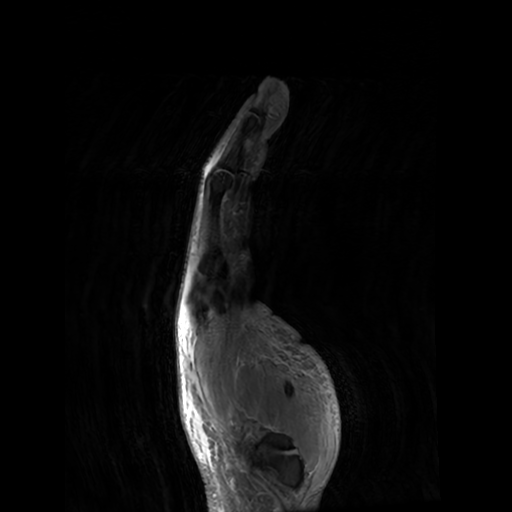
[im 29/65]
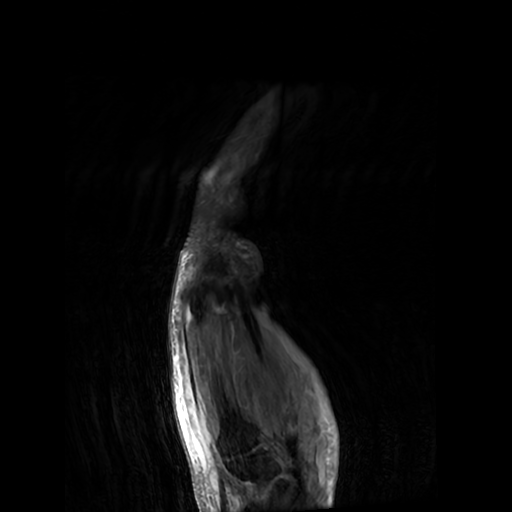
[im 36/65]
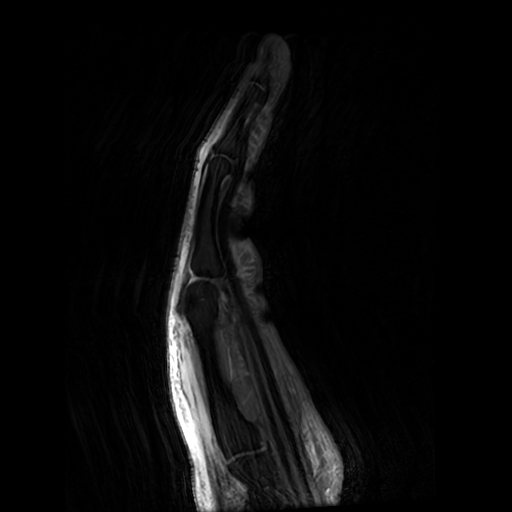
[im 43/65]
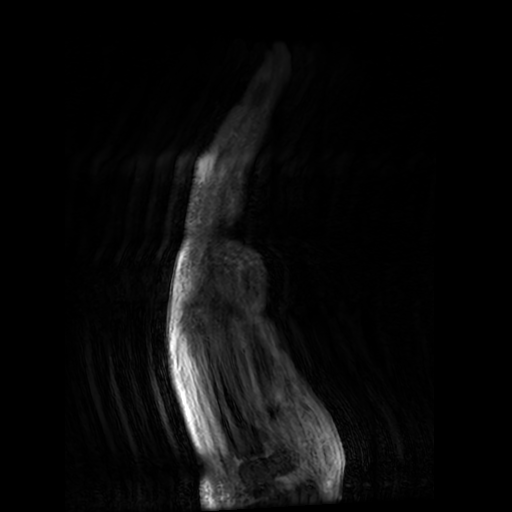
[im 57/65]
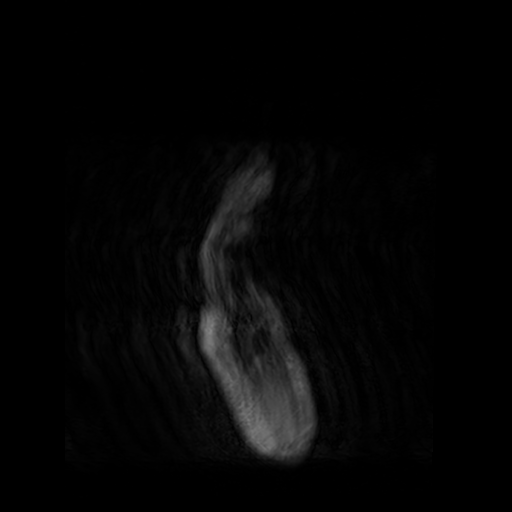
[im 65/65]
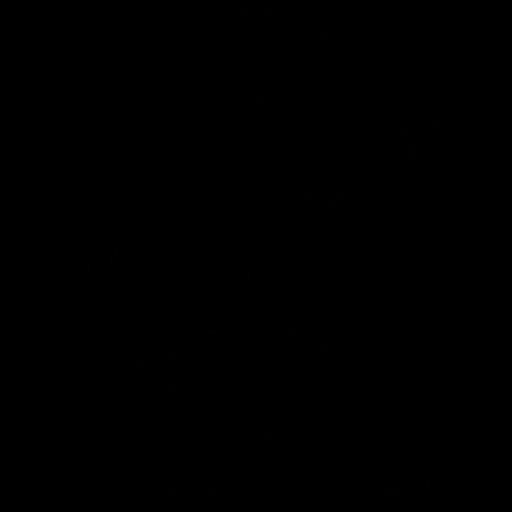

[Series 17: PD fat-sat · sagittal · 1.5mm · 0.39mm/px · 3 of 20 slices shown (2 of 2)]
[im 1/20]
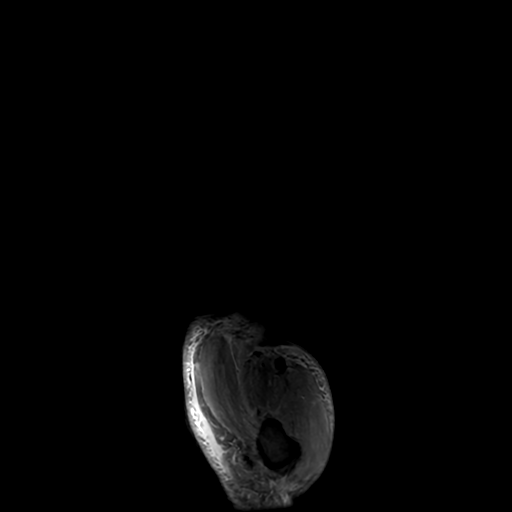
[im 10/20]
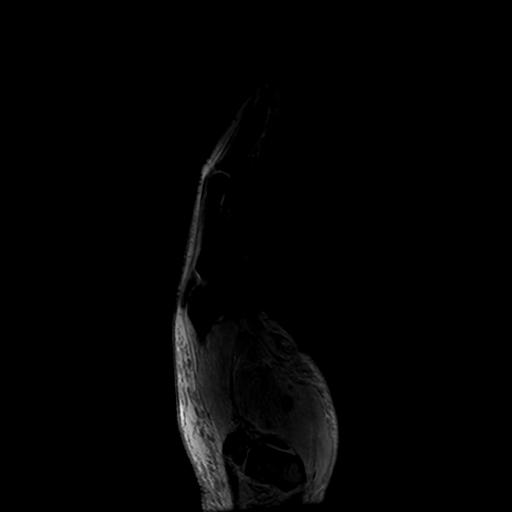
[im 20/20]
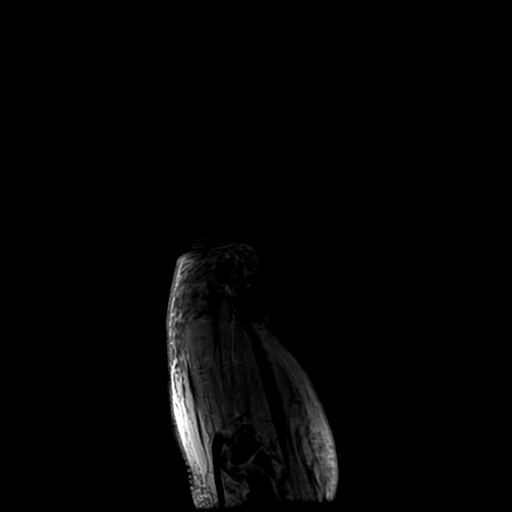

[19 of 40 positions shown; findings below may reference images not displayed]

FINDINGS: Bones/Joint/Cartilage

There is abnormal edema and erosion of the tuft of the distal
phalanx of the right index finger. The other bones of the hand
appear normal.

Ligaments

Intact.

Muscles and Tendons

Normal.

Soft tissues

Edema in the subcutaneous fat of the hand, most prominent on the
dorsum. No discrete abscesses.
IMPRESSION: Osteomyelitis of the tuft of the distal phalanx of the right index
finger.

Note is made that there is significant motion degradation on all
imaging sequences. However, I feel the study is diagnostic for
osteomyelitis of the tuft of the distal phalanx of the index finger.

## 2019-07-15 MED ORDER — HEPARIN (PORCINE) 25000 UT/250ML-% IV SOLN
1850.0000 [IU]/h | INTRAVENOUS | Status: DC
  Administered 2019-07-15 – 2019-07-16 (×2): 1800 [IU]/h via INTRAVENOUS
  Administered 2019-07-17: 1900 [IU]/h via INTRAVENOUS
  Filled 2019-07-15 (×4): qty 250

## 2019-07-15 NOTE — Anesthesia Postprocedure Evaluation (Signed)
Anesthesia Post Note  Patient: Max Pittman.  Procedure(s) Performed: IRRIGATION AND DEBRIDEMENT HIP (Left Hip)     Patient location during evaluation: PACU Anesthesia Type: General Level of consciousness: lethargic and patient cooperative Pain management: pain level controlled Vital Signs Assessment: post-procedure vital signs reviewed and stable Respiratory status: spontaneous breathing, nonlabored ventilation, respiratory function stable and patient connected to nasal cannula oxygen Cardiovascular status: stable and tachycardic Postop Assessment: no apparent nausea or vomiting Anesthetic complications: no   No complications documented.  Last Vitals:  Vitals:   07/14/19 2102 07/15/19 0527  BP: 120/70 123/81  Pulse: (!) 106 (!) 108  Resp: 18 16  Temp: 37.2 C 37.2 C  SpO2: 96% 95%    Last Pain:  Vitals:   07/15/19 0527  TempSrc: Oral  PainSc:                  Elzia Hott

## 2019-07-15 NOTE — Progress Notes (Signed)
  Echocardiogram 2D Echocardiogram has been performed.  Max Pittman 07/15/2019, 4:01 PM
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
## 2019-07-15 NOTE — Progress Notes (Signed)
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

## 2019-07-15 NOTE — Progress Notes (Addendum)
[facility] Progress Note  Patient ID: Max Pittman. MRN: [id number] DOB/AGE: 43-19-1977 43 y.o.  Subjective:  Doing ok States L hip pain feels better   Currently on peritoneal dialysis in room R sided IJ TDC in place from 04/2019 admission. Currently non being used, looks like there was an order for removal 06/19/2019 but was never done?  Intra-op cultures: gram + cocci   Baseline ESR and CRP completed   123 and 35.2  ROS As above  Objective:   VITALS:   Vitals:   07/14/19 1659 07/14/19 2102 07/15/19 0527 07/15/19 0901  BP: 122/76 120/70 123/81 119/74  Pulse: 96 (!) 106 (!) 108 (!) 105  Resp: _0 Temp: 97.9 F (36.6 C) 98.9 F (37.2 C) 99 F (37.2 C) 98.9 F (37.2 C)  TempSrc:  Oral Oral Oral  SpO2: 96% 96% 95% 92%  Weight:  121.9 kg    Height:        Estimated body mass index is 39.69 kg/m as calculated from the following:   Height as of this encounter: _1  (1.753 m).   Weight as of this encounter: 121.9 kg.   Intake/Output      06/18 0701 - 06/19 0700 06/19 0701 - 06/20 0700   P.O. 220 240   I.V. (mL/kg) 500 (4.1)    Other 40    IV Piggyback 500    Total Intake(mL/kg) 1260 (10.3) 240 (2)   Urine (mL/kg/hr) 400 (0.1) 150 (0.2)   Emesis/NG output 0    Drains 600    Other 0    Stool 0 0   Blood 30    Total Output 1030 150   Net +230 +90        Urine Occurrence 0 x    Stool Occurrence 0 x    Emesis Occurrence 0 x       LABS  Results for orders placed or performed during the hospital encounter of 07/13/19 (from the past 24 hour(s))  Glucose, capillary     Status: Abnormal   Collection Time: 07/14/19 12:06 PM  Result Value Ref Range   Glucose-Capillary 107 (H) 70 - 99 mg/dL  Aerobic/Anaerobic Culture (surgical/deep wound)     Status: None (Preliminary result)   Collection Time: 07/14/19  3:28 PM   Specimen: PATH Other; Tissue  Result Value  Ref Range   Specimen Description WOUND LEFT HIP INCISION    Special Requests NONE    Gram Stain      ABUNDANT WBC PRESENT, PREDOMINANTLY PMN FEW GRAM POSITIVE COCCI Performed at [facility], [place] [address], [place], [place] [address]    Culture PENDING    Report Status PENDING   Glucose, capillary     Status: Abnormal   Collection Time: 07/14/19  4:29 PM  Result Value Ref Range   Glucose-Capillary 115 (H) 70 - 99 mg/dL  Basic metabolic panel     Status: Abnormal   Collection Time: 07/14/19  8:48 PM  Result Value Ref Range   Sodium 133 (L) 135 - 145 mmol/L   Potassium 4.0 3.5 - 5.1 mmol/L   Chloride 95 (L) 98 - 111 mmol/L   CO2 22 22 - 32 mmol/L   Glucose, Bld 103 (H) 70 - 99 mg/dL   BUN 46 (  H) 6 - 20 mg/dL   Creatinine, Ser 10.23 (H) 0.61 - 1.24 mg/dL   Calcium 8.6 (L) 8.9 - 10.3 mg/dL   GFR calc non Af Amer 6 (L) >60 mL/min   GFR calc Af Amer 6 (L) >60 mL/min   Anion gap 16 (H) 5 - 15  Glucose, capillary     Status: Abnormal   Collection Time: 07/14/19 11:56 PM  Result Value Ref Range   Glucose-Capillary 123 (H) 70 - 99 mg/dL  Glucose, capillary     Status: Abnormal   Collection Time: 07/15/19  6:40 AM  Result Value Ref Range   Glucose-Capillary 185 (H) 70 - 99 mg/dL  C-reactive protein     Status: Abnormal   Collection Time: 07/15/19  7:34 AM  Result Value Ref Range   CRP 35.2 (H) <1.0 mg/dL  Protime-INR     Status: Abnormal   Collection Time: 07/15/19 10:11 AM  Result Value Ref Range   Prothrombin Time 17.0 (H) 11.4 - 15.2 seconds   INR 1.4 (H) 0.8 - 1.2  Sedimentation rate     Status: Abnormal   Collection Time: 07/15/19 10:11 AM  Result Value Ref Range   Sed Rate 123 (H) 0 - 16 mm/hr  Glucose, capillary     Status: Abnormal   Collection Time: 07/15/19 11:50 AM  Result Value Ref Range   Glucose-Capillary 240 (H) 70 - 99 mg/dL     PHYSICAL EXAM:   Gen: resting comfortably in bed, sitting up, NAD  Ext:      Left Lower Extremity   veraflo  instillation vac functioning appropriately, good seal and suction   Distal motor and sensory functions intact  Swelling stable    Assessment/Plan: 1 Day Post-Op   Principal Problem:   MSSA bacteremia Active Problems:   Peritoneal dialysis status ([place])   Diabetes mellitus ([place])   Multiple closed pelvic fractures with disruption of pelvic circle (HCC)   Diabetic peripheral neuropathy (HCC)   Abscess of left hip   Sepsis (HCC)   Chronic anticoagulation   Anti-infectives (From admission, onward)   Start     Dose/Rate Route Frequency Ordered Stop   07/15/19 1700  vancomycin (VANCOREADY) IVPB 1500 mg/300 mL  Status:  Discontinued        1,500 mg 150 mL/hr over 120 Minutes Intravenous Every 48 hours 07/13/19 1726 07/14/19 0845   07/14/19 1700  ceFEPIme (MAXIPIME) 2 g in sodium chloride 0.9 % 100 mL IVPB  Status:  Discontinued        2 g 200 mL/hr over 30 Minutes Intravenous Every 24 hours 07/13/19 1725 07/14/19 0845   07/14/19 1600  ceFAZolin (ANCEF) IVPB 1 g/50 mL premix     Discontinue     1 g 100 mL/hr over 30 Minutes Intravenous Every 24 hours 07/14/19 0859     07/14/19 1200  ceFAZolin (ANCEF) IVPB 2g/100 mL premix  Status:  Discontinued        2 g 200 mL/hr over 30 Minutes Intravenous Every M-W-F (Hemodialysis) 07/14/19 0846 07/14/19 0859   07/14/19 0200  metroNIDAZOLE (FLAGYL) IVPB 500 mg  Status:  Discontinued        500 mg 100 mL/hr over 60 Minutes Intravenous Every 8 hours 07/13/19 2250 07/14/19 0845   07/13/19 1730  ceFEPIme (MAXIPIME) 2 g in sodium chloride 0.9 % 100 mL IVPB        2 g 200 mL/hr over 30 Minutes Intravenous  Once 07/13/19 1715 07/13/19 1818   07/13/19  1730  metroNIDAZOLE (FLAGYL) IVPB 500 mg        500 mg 100 mL/hr over 60 Minutes Intravenous  Once 07/13/19 1715 07/13/19 1849   07/13/19 1730  vancomycin (VANCOCIN) IVPB 1000 mg/200 mL premix  Status:  Discontinued        1,000 mg 200 mL/hr over 60 Minutes Intravenous  Once 07/13/19 1715 07/13/19 1724    07/13/19 1730  vancomycin (VANCOREADY) IVPB 2000 mg/400 mL        2,000 mg 200 mL/hr over 120 Minutes Intravenous  Once 07/13/19 1724 07/13/19 2026    .  POD/HD#: 4  44 y/o male with complex medical history including ESRD on peritoneal dialysis 7 weeks post op ORIF complex L acetabulum fracture dislocation with L thigh abscess   -Left thigh/gluteal abscess s/p I&D  Continue with veraflo instillation vac  OR Monday for repeat I&D and closure   We are presuming hardware is infected at this point and will require removal once fracture is united   - L acetabulum fracture s/p ORIF 7 weeks post op  Partial WB with therapy, 50% BW  Posterior hip precautions   As above   CT shows maintained alignment   Minimal callus around fx zones   - Pain management:  Continue with current regimen   - Medical issues   Per medicine and renal   - ID:   Ancef per ID    - Dispo:  OR Monday afternoon for repeat I&D      Jari Pigg, PA-C [phone] (C) 07/15/2019, 11:59 AM  [facility] [place] [address] [phone] Domingo Sep (F)

## 2019-07-15 NOTE — Progress Notes (Signed)
PT Cancellation Note  Patient Details Name: Max Pittman. MRN: [id number] DOB: April 04, 1975   Cancelled Treatment:    Reason Eval/Treat Not Completed: Patient at procedure or test/unavailable. Transport present to take pt for an echo. PT will continue to f/u with pt acutely as available.    Clearnce Sorrel Estellar Cadena 07/15/2019, 11:10 AM

## 2019-07-15 NOTE — Progress Notes (Signed)
Paged Dr. Jonnie Finner concerning IV placement. Prior authorization given to place PIV in left hand that has graft/fistula. Unable to gain IV access. Patient has a sufficient cephalic vein to place a long PIV. Dr. Jonnie Finner gave verbal permission to access vessel. IV place under [place] without difficulty.

## 2019-07-15 NOTE — Progress Notes (Addendum)
ANTICOAGULATION CONSULT NOTE - Initial Consult  Pharmacy Consult for Heparin Indication: DVT  Allergies  Allergen Reactions  . Doxycycline Hives  . Latex Swelling    Pt reports swelling at site.   . Morphine And Related Anxiety    Patient Measurements: Height: 5\' 9"  (175.3 cm) Weight: 121.9 kg (268 lb 11.9 oz) IBW/kg (Calculated) : 70.7 Heparin Dosing Weight: 96.7 kg  Vital Signs: Temp: 99 F (37.2 C) (06/19 0527) Temp Source: Oral (06/19 0527) BP: 123/81 (06/19 0527) Pulse Rate: 108 (06/19 0527)  Labs: Recent Labs    07/13/19 1405 07/14/19 0841 07/14/19 2048  HGB 10.0*  --   --   HCT 33.3*  --   --   PLT 296  --   --   APTT 66*  --   --   LABPROT 27.2* 18.9*  --   INR 2.6* 1.7*  --   CREATININE 8.23*  --  10.23*    Estimated Creatinine Clearance: 12 mL/min (A) (by C-G formula based on SCr of 10.23 mg/dL (H)).   Medical History: Past Medical History:  Diagnosis Date  . Arthritis   . Closed dislocation of left hip ([place]) 05/21/2019  . Diabetes ([place])   . Diabetes mellitus without complication ([place])   . History of peritoneal dialysis   . Hypertension   . Renal disorder    FFGS  . Renal disorder   . Stroke [facility])    when ha was a child  . Vasovagal syndrome    with syncope    Medications:  Medications Prior to Admission  Medication Sig Dispense Refill Last Dose  . acetaminophen (TYLENOL) 325 MG tablet Take 2 tablets (650 mg total) by mouth every 8 (eight) hours.   07/12/2019 at Unknown time  . amLODipine (NORVASC) 5 MG tablet Take 1 tablet (5 mg total) by mouth daily. 30 tablet 0 07/12/2019 at Unknown time  . aspirin EC 81 MG EC tablet Take 1 tablet (81 mg total) by mouth daily.   07/12/2019 at Unknown time  . atorvastatin (LIPITOR) 40 MG tablet Take 1 tablet (40 mg total) by mouth daily at 6 PM. 31 tablet 6 07/12/2019 at Unknown time  . buPROPion (WELLBUTRIN SR) 150 MG 12 hr tablet Take 1 tablet (150 mg total) by mouth 2 (two) times daily with a meal. 60  tablet 0 07/12/2019 at Unknown time  . cholecalciferol (VITAMIN D) 25 MCG tablet Take 2 tablets (2,000 Units total) by mouth 2 (two) times daily. 60 tablet 0 07/12/2019 at Unknown time  . docusate sodium (COLACE) 100 MG capsule Take 300 mg by mouth daily as needed for mild constipation or moderate constipation. As directed    Past Month at Unknown time  . HYDROcodone-acetaminophen (NORCO/VICODIN) 5-325 MG tablet Take 1 tablet by mouth every 8 (eight) hours as needed.   Past Week at Unknown time  . loratadine (CLARITIN) 10 MG tablet Take 1 tablet (10 mg total) by mouth daily. 30 tablet 0 07/12/2019 at Unknown time  . methocarbamol (ROBAXIN) 500 MG tablet Take 2 tablets (1,000 mg total) by mouth 3 (three) times daily. 90 tablet 0 07/11/2019 at Unknown time  . metoprolol tartrate (LOPRESSOR) 25 MG tablet Take 0.5 tablets (12.5 mg total) by mouth 2 (two) times daily. 30 tablet 0 07/12/2019 at 2000  . omeprazole (PRILOSEC) 20 MG capsule Take 1 capsule (20 mg total) by mouth daily. 30 capsule 0 07/12/2019 at Unknown time  . pioglitazone (ACTOS) 15 MG tablet Take 1 tablet (15  mg total) by mouth daily. 30 tablet 0 07/12/2019 at Unknown time  . pregabalin (LYRICA) 50 MG capsule Take 1 capsule (50 mg total) by mouth at bedtime. 30 capsule 0 07/12/2019 at Unknown time  . sevelamer carbonate (RENVELA) 800 MG tablet Take 3 tablets (2,400 mg total) by mouth 3 (three) times daily with meals. 240 tablet 1 07/12/2019 at Unknown time  . warfarin (COUMADIN) 2 MG tablet Take 1 tablet (2 mg total) by mouth daily. Or as directed by [facility] (Patient taking differently: Take 2 mg by mouth every evening. ) 30 tablet 4 07/12/2019 at 2000  . oxyCODONE (OXYCONTIN) 10 mg 12 hr tablet Take 1 tablet (10 mg total) by mouth daily. (Patient not taking: Reported on 07/13/2019) 14 tablet 0 Not Taking at Unknown time    Assessment: Patient is post op day 1 with wound vac and peritoneal dialysis.  On warfarin for DVT which was stopped for  surgery.  Received phytonadione 5 mg IV 07/14/19 0037.  Heparin infusion per pharmacy consult ordered today with plans for Orthopedic Surgery to dictate when to start warfarin.  Patient was previously on a heparin infusion last week and a heparin level of 0.26 was achieved with heparin infusing at 1800 units/hr.  Goal of Therapy:  Heparin Level goal = 0.2-0.5 Monitor platelets by anticoagulation protocol: Yes   Plan:  No heparin bolus Start heparin infusion at 1800 units/hr Check anti-Xa level in 8 hours and daily while on heparin Continue to monitor H&H and platelets, s/s bleeding   Efraim Kaufmann, PharmD, BCPS 07/15/2019,8:01 AM
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
## 2019-07-16 ENCOUNTER — Encounter (HOSPITAL_COMMUNITY): Payer: Self-pay | Admitting: Orthopedic Surgery

## 2019-07-16 LAB — HEPARIN LEVEL (UNFRACTIONATED)
Heparin Unfractionated: 0.21 IU/mL — ABNORMAL LOW (ref 0.30–0.70)
Heparin Unfractionated: 0.56 IU/mL (ref 0.30–0.70)
Heparin Unfractionated: 0.38 IU/mL (ref 0.30–0.70)

## 2019-07-16 LAB — CULTURE, BLOOD (ROUTINE X 2)
Special Requests: ADEQUATE
Special Requests: ADEQUATE

## 2019-07-16 LAB — IRON AND TIBC: Iron: 36 ug/dL — ABNORMAL LOW (ref 45–182)

## 2019-07-16 LAB — CBC
HCT: 25.8 % — ABNORMAL LOW (ref 39.0–52.0)
Hemoglobin: 8 g/dL — ABNORMAL LOW (ref 13.0–17.0)
MCH: 28 pg (ref 26.0–34.0)
MCHC: 31 g/dL (ref 30.0–36.0)
MCV: 90.2 fL (ref 80.0–100.0)
Platelets: 264 10*3/uL (ref 150–400)
RBC: 2.86 MIL/uL — ABNORMAL LOW (ref 4.22–5.81)
RDW: 16.3 % — ABNORMAL HIGH (ref 11.5–15.5)
WBC: 13.6 10*3/uL — ABNORMAL HIGH (ref 4.0–10.5)
nRBC: 0.1 % (ref 0.0–0.2)

## 2019-07-16 LAB — GLUCOSE, CAPILLARY
Glucose-Capillary: 102 mg/dL — ABNORMAL HIGH (ref 70–99)
Glucose-Capillary: 123 mg/dL — ABNORMAL HIGH (ref 70–99)
Glucose-Capillary: 133 mg/dL — ABNORMAL HIGH (ref 70–99)
Glucose-Capillary: 152 mg/dL — ABNORMAL HIGH (ref 70–99)

## 2019-07-16 LAB — PROTIME-INR
INR: 1.3 — ABNORMAL HIGH (ref 0.8–1.2)
Prothrombin Time: 15.5 seconds — ABNORMAL HIGH (ref 11.4–15.2)

## 2019-07-16 MED ORDER — DARBEPOETIN ALFA 100 MCG/0.5ML IJ SOSY
100.0000 ug | PREFILLED_SYRINGE | INTRAMUSCULAR | Status: DC
  Administered 2019-07-16: 100 ug via SUBCUTANEOUS
  Filled 2019-07-16: qty 0.5
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
## 2019-07-16 NOTE — Progress Notes (Signed)
This on-call nurse called in d/t PD machine malfunction. Product manager alarm. Pt. Disconnect to further evaluate. Upon evaluation noted cassette with hole leaking fluid. Pt. Stable 4 of 5 fill and drain complete.
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
## 2019-07-16 NOTE — Evaluation (Signed)
Physical Therapy Evaluation Patient Details Name: Max Pittman. MRN: [id number] DOB: December 03, 1975 Today's Date: 07/16/2019   History of Present Illness  44 y.o. male with medical history significant for ESRD on peritoneal dialysis, diabetes mellitus, hypertension, DVT. Pt with L hip surgery in April, having persistent pain in this hip since, unable to walk recently due to pain. 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 Pt also hospitalized 6/4-10 for respiratory failure. Pt with L hip fluid collection, undergoing I&D L hip on 6/18. 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  Clinical Impression  Pt presents to PT with deficits in functional mobility, gait, balance, strength, power, and endurance. Pt currently requires assistance to mobilize RLE in bed and physical assistance to power up into standing. Pt is able to ambulate, utilizing UE strength through RW to maintain PWB precautions. Pt will benefit from continued acute PT services to improve activity tolerance and to restore independence in household mobility. PT currently recommending discharge home with HHPT and assistance from significant other as needed.    Follow Up Recommendations Home health PT;Supervision for mobility/OOB    Equipment Recommendations  None recommended by PT (pt owns RW and bedside commode)    Recommendations for Other Services       Precautions / Restrictions Precautions Precautions: Fall Restrictions Weight Bearing Restrictions: Yes LLE Weight Bearing: Partial weight bearing      Mobility  Bed Mobility Overal bed mobility: Needs Assistance Bed Mobility: (P) Supine to Sit;Sit to Supine     Supine to sit: Mod assist     General bed mobility comments: for LLE management and to turn hips at edge of bed  Transfers Overall transfer level: Needs assistance Equipment used: Rolling walker (2 wheeled) Transfers: Sit  to/from Stand Sit to Stand: Min assist         General transfer comment: assistance to power up into standing  Ambulation/Gait Ambulation/Gait assistance: Supervision Gait Distance (Feet): 15 Feet (15' x 2 to and from bathroom) Assistive device: Rolling walker (2 wheeled) Gait Pattern/deviations: Step-to pattern Gait velocity: reduced Gait velocity interpretation: <1.8 ft/sec, indicate of risk for recurrent falls General Gait Details: pt with short step to gait, dependent on UE support  Stairs            Wheelchair Mobility    Modified Rankin (Stroke Patients Only)       Balance Overall balance assessment: Needs assistance Sitting-balance support: Single extremity supported;Feet supported Sitting balance-Leahy Scale: Good Sitting balance - Comments: supervision   Standing balance support: Bilateral upper extremity supported Standing balance-Leahy Scale: Fair Standing balance comment: close supervision for static standing balance                             Pertinent Vitals/Pain Pain Assessment: Faces Faces Pain Scale: Hurts even more Pain Location: L thigh/hip Pain Descriptors / Indicators: Aching Pain Intervention(s): Monitored during session    Home Living Family/patient expects to be discharged to:: Private residence Living Arrangements: Spouse/significant other Available Help at Discharge: Available 24 hours/day;Family Type of Home: Mobile home Home Access: Stairs to enter Entrance Stairs-Rails: Left Entrance Stairs-Number of Steps: 3 Home Layout: One level Home Equipment: Walker - 2 wheels;Bedside commode;Adaptive equipment (hip kit)      Prior Function Level of Independence: Needs assistance   Gait / Transfers Assistance Needed: pt reports ambulating with RW at home  without assistance     Comments: girlfriend assists with PD     Hand Dominance   Dominant Hand: Right    Extremity/Trunk Assessment   Upper Extremity  Assessment Upper Extremity Assessment: Overall WFL for tasks assessed    Lower Extremity Assessment Lower Extremity Assessment: LLE deficits/detail;RLE deficits/detail RLE Sensation: decreased light touch LLE Deficits / Details: generalized weaknees likely 2/2 pain LLE Sensation: decreased light touch    Cervical / Trunk Assessment Cervical / Trunk Assessment: Normal  Communication   Communication: No difficulties  Cognition Arousal/Alertness: Awake/alert Behavior During Therapy: WFL for tasks assessed/performed Overall Cognitive Status: Within Functional Limits for tasks assessed                                        General Comments General comments (skin integrity, edema, etc.): pt tachy up to 130 during session. Pt with wound vac    Exercises     Assessment/Plan    PT Assessment Patient needs continued PT services  PT Problem List Decreased strength;Decreased mobility;Obesity;Pain;Impaired sensation;Decreased balance;Decreased activity tolerance;Decreased skin integrity       PT Treatment Interventions Gait training;Therapeutic exercise;Patient/family education;Therapeutic activities;Balance training;Functional mobility training;Neuromuscular re-education    PT Goals (Current goals can be found in the Care Plan section)  Acute Rehab PT Goals Patient Stated Goal: To improve mobility quality and LE strength PT Goal Formulation: With patient Time For Goal Achievement: 07/30/19 Potential to Achieve Goals: Good    Frequency Min 3X/week   Barriers to discharge        Co-evaluation               AM-PAC PT "6 Clicks" Mobility  Outcome Measure Help needed turning from your back to your side while in a flat bed without using bedrails?: A Little Help needed moving from lying on your back to sitting on the side of a flat bed without using bedrails?: A Lot Help needed moving to and from a bed to a chair (including a wheelchair)?: A Little Help  needed standing up from a chair using your arms (e.g., wheelchair or bedside chair)?: A Little Help needed to walk in hospital room?: A Little Help needed climbing 3-5 steps with a railing? : A Lot 6 Click Score: 16    End of Session   Activity Tolerance: Patient tolerated treatment well Patient left: in bed;with call bell/phone within reach;with bed alarm set Nurse Communication: Mobility status PT Visit Diagnosis: Pain;Other (comment);Muscle weakness (generalized) (M62.81);Difficulty in walking, not elsewhere classified (R26.2) Pain - Right/Left: Left Pain - part of body: Leg    Time: 8338-2505 PT Time Calculation (min) (ACUTE ONLY): 49 min   Charges:   PT Evaluation $PT Eval Moderate Complexity: 1 Mod PT Treatments $Therapeutic Activity: 8-22 mins        Zenaida Niece, PT, DPT Acute Rehabilitation Pager: [phone]   Zenaida Niece 07/16/2019, 3:38 PM

## 2019-07-16 NOTE — Progress Notes (Addendum)
[place] for Heparin Indication: DVT  Allergies  Allergen Reactions  . Doxycycline Hives  . Latex Swelling    Pt reports swelling at site.   . Morphine And Related Anxiety    Patient Measurements: Height: 5\' 9"  (175.3 cm) Weight: 121.9 kg (268 lb 11.9 oz) IBW/kg (Calculated) : 70.7 Heparin Dosing Weight: 96.7 kg  Vital Signs: Temp: 99 F (37.2 C) (06/20 1000) Temp Source: Oral (06/20 1000) BP: 138/82 (06/20 1000) Pulse Rate: 106 (06/20 1000)  Labs: Recent Labs    07/14/19 0841 07/14/19 2048 07/15/19 1011 07/15/19 1616 07/16/19 0417 07/16/19 1423  HGB  --   --   --   --  8.0*  --   HCT  --   --   --   --  25.8*  --   PLT  --   --   --   --  264  --   LABPROT 18.9*  --  17.0*  --  15.5*  --   INR 1.7*  --  1.4*  --  1.3*  --   HEPARINUNFRC  --   --   --  <0.10* 0.21* 0.56  CREATININE  --  10.23*  --   --   --   --     Estimated Creatinine Clearance: 12 mL/min (A) (by C-G formula based on SCr of 10.23 mg/dL (H)).   Medical History: Past Medical History:  Diagnosis Date  . Arthritis   . Closed dislocation of left hip ([place]) 05/21/2019  . Diabetes ([place])   . Diabetes mellitus without complication ([place])   . History of peritoneal dialysis   . Hypertension   . Renal disorder    FFGS  . Renal disorder   . Stroke [facility])    when ha was a child  . Vasovagal syndrome    with syncope    Medications:  Medications Prior to Admission  Medication Sig Dispense Refill Last Dose  . acetaminophen (TYLENOL) 325 MG tablet Take 2 tablets (650 mg total) by mouth every 8 (eight) hours.   07/12/2019 at Unknown time  . amLODipine (NORVASC) 5 MG tablet Take 1 tablet (5 mg total) by mouth daily. 30 tablet 0 07/12/2019 at Unknown time  . aspirin EC 81 MG EC tablet Take 1 tablet (81 mg total) by mouth daily.   07/12/2019 at Unknown time  . atorvastatin (LIPITOR) 40 MG tablet Take 1 tablet (40 mg total) by mouth daily at 6 PM. 31 tablet 6  07/12/2019 at Unknown time  . buPROPion (WELLBUTRIN SR) 150 MG 12 hr tablet Take 1 tablet (150 mg total) by mouth 2 (two) times daily with a meal. 60 tablet 0 07/12/2019 at Unknown time  . cholecalciferol (VITAMIN D) 25 MCG tablet Take 2 tablets (2,000 Units total) by mouth 2 (two) times daily. 60 tablet 0 07/12/2019 at Unknown time  . docusate sodium (COLACE) 100 MG capsule Take 300 mg by mouth daily as needed for mild constipation or moderate constipation. As directed    Past Month at Unknown time  . HYDROcodone-acetaminophen (NORCO/VICODIN) 5-325 MG tablet Take 1 tablet by mouth every 8 (eight) hours as needed.   Past Week at Unknown time  . loratadine (CLARITIN) 10 MG tablet Take 1 tablet (10 mg total) by mouth daily. 30 tablet 0 07/12/2019 at Unknown time  . methocarbamol (ROBAXIN) 500 MG tablet Take 2 tablets (1,000 mg total) by mouth 3 (three) times daily. 90 tablet 0 07/11/2019 at Unknown  time  . metoprolol tartrate (LOPRESSOR) 25 MG tablet Take 0.5 tablets (12.5 mg total) by mouth 2 (two) times daily. 30 tablet 0 07/12/2019 at 2000  . omeprazole (PRILOSEC) 20 MG capsule Take 1 capsule (20 mg total) by mouth daily. 30 capsule 0 07/12/2019 at Unknown time  . pioglitazone (ACTOS) 15 MG tablet Take 1 tablet (15 mg total) by mouth daily. 30 tablet 0 07/12/2019 at Unknown time  . pregabalin (LYRICA) 50 MG capsule Take 1 capsule (50 mg total) by mouth at bedtime. 30 capsule 0 07/12/2019 at Unknown time  . sevelamer carbonate (RENVELA) 800 MG tablet Take 3 tablets (2,400 mg total) by mouth 3 (three) times daily with meals. 240 tablet 1 07/12/2019 at Unknown time  . warfarin (COUMADIN) 2 MG tablet Take 1 tablet (2 mg total) by mouth daily. Or as directed by [facility]madin Clinic (Patient taking differently: Take 2 mg by mouth every evening. ) 30 tablet 4 07/12/2019 at 2000  . oxyCODONE (OXYCONTIN) 10 mg 12 hr tablet Take 1 tablet (10 mg total) by mouth daily. (Patient not taking: Reported on 07/13/2019) 14 tablet 0 Not  Taking at Unknown time    Assessment: Patient is post op day 2 with wound vac and peritoneal dialysis.  On warfarin for DVT which was stopped for surgery.  Received phytonadione 5 mg IV 07/14/19 0037.  Heparin infusion per pharmacy consult initiated 07/15/18 with plans for Orthopedic Surgery to dictate when to start warfarin.  Patient was previously on a heparin infusion last week and a heparin level of 0.26 was achieved with heparin infusing at 1800 units/hr. Heparin infusion increased to 1950 units/hr this morning based on heparin level 0.21 when heparin infusing at 1800 units/hr.    6/20 afternoon update:  Heparin level slightly above goal at 0.56 with heparin infusing at 1950 units/hr No issues per RN  Goal of Therapy:  Heparin Level goal = 0.2-0.5 Monitor platelets by anticoagulation protocol: Yes   Plan:  Decrease heparin to 1900 units/hr Re-check heparin level in 6 hours Monitor daily heparin level, CBC, s/s bleeding   Shela Commons, PharmD, BCPS Clinical Pharmacist [place] Please utilize Amion for appropriate phone number to reach the unit pharmacist ([place])

## 2019-07-16 NOTE — Progress Notes (Signed)
[place] for Heparin Indication: DVT  Allergies  Allergen Reactions  . Doxycycline Hives  . Latex Swelling    Pt reports swelling at site.   . Morphine And Related Anxiety    Patient Measurements: Height: 5\' 9"  (175.3 cm) Weight: 121.9 kg (268 lb 11.9 oz) IBW/kg (Calculated) : 70.7 Heparin Dosing Weight: 96.7 kg  Vital Signs: Temp: 98 F (36.7 C) (06/20 2155) Temp Source: Oral (06/20 2155) BP: 139/87 (06/20 2155) Pulse Rate: 102 (06/20 2155)  Labs: Recent Labs    07/14/19 0841 07/14/19 2048 07/15/19 1011 07/15/19 1616 07/16/19 0417 07/16/19 1423 07/16/19 2242  HGB  --   --   --   --  8.0*  --   --   HCT  --   --   --   --  25.8*  --   --   PLT  --   --   --   --  264  --   --   LABPROT 18.9*  --  17.0*  --  15.5*  --   --   INR 1.7*  --  1.4*  --  1.3*  --   --   HEPARINUNFRC  --   --   --    < > 0.21* 0.56 0.38  CREATININE  --  10.23*  --   --   --   --   --    < > = values in this interval not displayed.    Estimated Creatinine Clearance: 12 mL/min (A) (by C-G formula based on SCr of 10.23 mg/dL (H)).   Medical History: Past Medical History:  Diagnosis Date  . Arthritis   . Closed dislocation of left hip ([place]) 05/21/2019  . Diabetes ([place])   . Diabetes mellitus without complication ([place])   . History of peritoneal dialysis   . Hypertension   . Renal disorder    FFGS  . Renal disorder   . Stroke [facility])    when ha was a child  . Vasovagal syndrome    with syncope    Medications:  Medications Prior to Admission  Medication Sig Dispense Refill Last Dose  . acetaminophen (TYLENOL) 325 MG tablet Take 2 tablets (650 mg total) by mouth every 8 (eight) hours.   07/12/2019 at Unknown time  . amLODipine (NORVASC) 5 MG tablet Take 1 tablet (5 mg total) by mouth daily. 30 tablet 0 07/12/2019 at Unknown time  . aspirin EC 81 MG EC tablet Take 1 tablet (81 mg total) by mouth daily.   07/12/2019 at Unknown time  .  atorvastatin (LIPITOR) 40 MG tablet Take 1 tablet (40 mg total) by mouth daily at 6 PM. 31 tablet 6 07/12/2019 at Unknown time  . buPROPion (WELLBUTRIN SR) 150 MG 12 hr tablet Take 1 tablet (150 mg total) by mouth 2 (two) times daily with a meal. 60 tablet 0 07/12/2019 at Unknown time  . cholecalciferol (VITAMIN D) 25 MCG tablet Take 2 tablets (2,000 Units total) by mouth 2 (two) times daily. 60 tablet 0 07/12/2019 at Unknown time  . docusate sodium (COLACE) 100 MG capsule Take 300 mg by mouth daily as needed for mild constipation or moderate constipation. As directed    Past Month at Unknown time  . HYDROcodone-acetaminophen (NORCO/VICODIN) 5-325 MG tablet Take 1 tablet by mouth every 8 (eight) hours as needed.   Past Week at Unknown time  . loratadine (CLARITIN) 10 MG tablet Take 1 tablet (10 mg total)  by mouth daily. 30 tablet 0 07/12/2019 at Unknown time  . methocarbamol (ROBAXIN) 500 MG tablet Take 2 tablets (1,000 mg total) by mouth 3 (three) times daily. 90 tablet 0 07/11/2019 at Unknown time  . metoprolol tartrate (LOPRESSOR) 25 MG tablet Take 0.5 tablets (12.5 mg total) by mouth 2 (two) times daily. 30 tablet 0 07/12/2019 at 2000  . omeprazole (PRILOSEC) 20 MG capsule Take 1 capsule (20 mg total) by mouth daily. 30 capsule 0 07/12/2019 at Unknown time  . pioglitazone (ACTOS) 15 MG tablet Take 1 tablet (15 mg total) by mouth daily. 30 tablet 0 07/12/2019 at Unknown time  . pregabalin (LYRICA) 50 MG capsule Take 1 capsule (50 mg total) by mouth at bedtime. 30 capsule 0 07/12/2019 at Unknown time  . sevelamer carbonate (RENVELA) 800 MG tablet Take 3 tablets (2,400 mg total) by mouth 3 (three) times daily with meals. 240 tablet 1 07/12/2019 at Unknown time  . warfarin (COUMADIN) 2 MG tablet Take 1 tablet (2 mg total) by mouth daily. Or as directed by Coumadin Clinic (Patient taking differently: Take 2 mg by mouth every evening. ) 30 tablet 4 07/12/2019 at 2000  . oxyCODONE (OXYCONTIN) 10 mg 12 hr tablet Take  1 tablet (10 mg total) by mouth daily. (Patient not taking: Reported on 07/13/2019) 14 tablet 0 Not Taking at Unknown time    Assessment: Patient is post op day 1 with wound vac and peritoneal dialysis.  On warfarin for DVT which was stopped for surgery.  Received phytonadione 5 mg IV 07/14/19 0037.  Heparin infusion per pharmacy consult ordered today with plans for Orthopedic Surgery to dictate when to start warfarin.  Patient was previously on a heparin infusion last week and a heparin level of 0.26 was achieved with heparin infusing at 1800 units/hr.  6/20 PM update:  Heparin level therapeutic  Goal of Therapy:  Heparin Level goal = 0.3-0.5 Monitor platelets by anticoagulation protocol: Yes   Plan:  Cont heparin 1900 units/hr Heparin level with AM labs  Narda Bonds, PharmD, BCPS Clinical Pharmacist Phone: [phone]

## 2019-07-17 ENCOUNTER — Inpatient Hospital Stay (HOSPITAL_COMMUNITY): Payer: Medicare Other | Admitting: Certified Registered"

## 2019-07-17 ENCOUNTER — Encounter (HOSPITAL_COMMUNITY)
Admission: AD | Disposition: A | Discharge status: Home Health | Payer: Self-pay | Source: Home / Self Care | Attending: Internal Medicine

## 2019-07-17 ENCOUNTER — Encounter (HOSPITAL_COMMUNITY): Payer: Self-pay | Admitting: Internal Medicine

## 2019-07-17 DIAGNOSIS — M869 Osteomyelitis, unspecified: Secondary | ICD-10-CM | POA: Diagnosis present

## 2019-07-17 DIAGNOSIS — Z952 Presence of prosthetic heart valve: Secondary | ICD-10-CM

## 2019-07-17 HISTORY — PX: APPLICATION OF WOUND VAC: SHX5189

## 2019-07-17 HISTORY — PX: INCISION AND DRAINAGE HIP: SHX1801

## 2019-07-17 HISTORY — PX: REMOVAL OF A DIALYSIS CATHETER: SHX6053

## 2019-07-17 LAB — CBC
HCT: 26 % — ABNORMAL LOW (ref 39.0–52.0)
Hemoglobin: 8 g/dL — ABNORMAL LOW (ref 13.0–17.0)
MCH: 27.7 pg (ref 26.0–34.0)
MCHC: 30.8 g/dL (ref 30.0–36.0)
MCV: 90 fL (ref 80.0–100.0)
Platelets: 256 10*3/uL (ref 150–400)
RBC: 2.89 MIL/uL — ABNORMAL LOW (ref 4.22–5.81)
RDW: 16.4 % — ABNORMAL HIGH (ref 11.5–15.5)
WBC: 16.8 10*3/uL — ABNORMAL HIGH (ref 4.0–10.5)
nRBC: 0.2 % (ref 0.0–0.2)

## 2019-07-17 LAB — HEPARIN LEVEL (UNFRACTIONATED)
Heparin Unfractionated: 0.52 IU/mL (ref 0.30–0.70)
Heparin Unfractionated: 0.26 IU/mL — ABNORMAL LOW (ref 0.30–0.70)

## 2019-07-17 LAB — GLUCOSE, CAPILLARY
Glucose-Capillary: 127 mg/dL — ABNORMAL HIGH (ref 70–99)
Glucose-Capillary: 132 mg/dL — ABNORMAL HIGH (ref 70–99)
Glucose-Capillary: 133 mg/dL — ABNORMAL HIGH (ref 70–99)
Glucose-Capillary: 144 mg/dL — ABNORMAL HIGH (ref 70–99)
Glucose-Capillary: 148 mg/dL — ABNORMAL HIGH (ref 70–99)
Glucose-Capillary: 160 mg/dL — ABNORMAL HIGH (ref 70–99)
Glucose-Capillary: 276 mg/dL — ABNORMAL HIGH (ref 70–99)

## 2019-07-17 LAB — SURGICAL PCR SCREEN
MRSA, PCR: NEGATIVE
Staphylococcus aureus: POSITIVE — AB

## 2019-07-17 LAB — PROTIME-INR
INR: 1.3 — ABNORMAL HIGH (ref 0.8–1.2)
Prothrombin Time: 16 seconds — ABNORMAL HIGH (ref 11.4–15.2)

## 2019-07-17 SURGERY — IRRIGATION AND DEBRIDEMENT HIP
Anesthesia: General | Site: Neck | Laterality: Right

## 2019-07-17 MED ORDER — LIDOCAINE 2% (20 MG/ML) 5 ML SYRINGE
INTRAMUSCULAR | Status: AC
  Filled 2019-07-17: qty 5

## 2019-07-17 MED ORDER — LACTATED RINGERS IV SOLN: INTRAVENOUS | Status: DC

## 2019-07-17 MED ORDER — SODIUM CHLORIDE 0.9 % IR SOLN
Status: DC | PRN
  Administered 2019-07-17: 3000 mL

## 2019-07-17 MED ORDER — LIDOCAINE HCL (CARDIAC) PF 100 MG/5ML IV SOSY
PREFILLED_SYRINGE | INTRAVENOUS | Status: DC | PRN
  Administered 2019-07-17: 60 mg via INTRAVENOUS

## 2019-07-17 MED ORDER — CEFAZOLIN SODIUM-DEXTROSE 2-3 GM-%(50ML) IV SOLR
INTRAVENOUS | Status: DC | PRN
Start: 2019-07-17 — End: 2019-07-17
  Administered 2019-07-17: 2 g via INTRAVENOUS

## 2019-07-17 MED ORDER — SODIUM CHLORIDE 0.9 % IV SOLN: INTRAVENOUS | Status: DC

## 2019-07-17 MED ORDER — ROCURONIUM BROMIDE 10 MG/ML (PF) SYRINGE
PREFILLED_SYRINGE | INTRAVENOUS | Status: AC
  Filled 2019-07-17: qty 20

## 2019-07-17 MED ORDER — PROPOFOL 10 MG/ML IV BOLUS
INTRAVENOUS | Status: DC | PRN
  Administered 2019-07-17: 150 mg via INTRAVENOUS

## 2019-07-17 MED ORDER — MIDAZOLAM HCL 2 MG/2ML IJ SOLN
INTRAMUSCULAR | Status: DC | PRN
  Administered 2019-07-17 (×2): 1 mg via INTRAVENOUS

## 2019-07-17 MED ORDER — OXYCODONE HCL 5 MG/5ML PO SOLN: 5.0000 mg | Freq: Once | ORAL | Status: DC | PRN

## 2019-07-17 MED ORDER — MIDAZOLAM HCL 2 MG/2ML IJ SOLN
INTRAMUSCULAR | Status: AC
  Filled 2019-07-17: qty 2

## 2019-07-17 MED ORDER — ONDANSETRON HCL 4 MG/2ML IJ SOLN
INTRAMUSCULAR | Status: AC
  Filled 2019-07-17: qty 2

## 2019-07-17 MED ORDER — FENTANYL CITRATE (PF) 250 MCG/5ML IJ SOLN
INTRAMUSCULAR | Status: AC
  Filled 2019-07-17: qty 5

## 2019-07-17 MED ORDER — PROPOFOL 10 MG/ML IV BOLUS
INTRAVENOUS | Status: AC
  Filled 2019-07-17: qty 20

## 2019-07-17 MED ORDER — 0.9 % SODIUM CHLORIDE (POUR BTL) OPTIME
TOPICAL | Status: DC | PRN
  Administered 2019-07-17: 1000 mL

## 2019-07-17 MED ORDER — ONDANSETRON HCL 4 MG/2ML IJ SOLN
INTRAMUSCULAR | Status: DC | PRN
  Administered 2019-07-17: 4 mg via INTRAVENOUS

## 2019-07-17 MED ORDER — CHLORHEXIDINE GLUCONATE 0.12 % MT SOLN
OROMUCOSAL | Status: AC
  Administered 2019-07-17: 15 mL via OROMUCOSAL
  Filled 2019-07-17: qty 15

## 2019-07-17 MED ORDER — PHENYLEPHRINE 40 MCG/ML (10ML) SYRINGE FOR IV PUSH (FOR BLOOD PRESSURE SUPPORT)
PREFILLED_SYRINGE | INTRAVENOUS | Status: AC
  Filled 2019-07-17: qty 10

## 2019-07-17 MED ORDER — HYDROMORPHONE HCL 1 MG/ML IJ SOLN: 0.2500 mg | INTRAMUSCULAR | Status: DC | PRN

## 2019-07-17 MED ORDER — DEXAMETHASONE SODIUM PHOSPHATE 10 MG/ML IJ SOLN
INTRAMUSCULAR | Status: AC
  Filled 2019-07-17: qty 1

## 2019-07-17 MED ORDER — FENTANYL CITRATE (PF) 100 MCG/2ML IJ SOLN
INTRAMUSCULAR | Status: DC | PRN
  Administered 2019-07-17: 50 ug via INTRAVENOUS

## 2019-07-17 MED ORDER — OXYCODONE HCL 5 MG PO TABS: 5.0000 mg | ORAL_TABLET | Freq: Once | ORAL | Status: DC | PRN

## 2019-07-17 MED ORDER — DEXAMETHASONE SODIUM PHOSPHATE 10 MG/ML IJ SOLN
INTRAMUSCULAR | Status: DC | PRN
Start: 2019-07-17 — End: 2019-07-17
  Administered 2019-07-17: 4 mg via INTRAVENOUS

## 2019-07-17 MED ORDER — ORAL CARE MOUTH RINSE: 15.0000 mL | Freq: Once | OROMUCOSAL | Status: AC

## 2019-07-17 MED ORDER — TOBRAMYCIN SULFATE 1.2 G IJ SOLR
INTRAMUSCULAR | Status: AC
  Filled 2019-07-17: qty 1.2

## 2019-07-17 MED ORDER — MUPIROCIN 2 % EX OINT
TOPICAL_OINTMENT | Freq: Two times a day (BID) | CUTANEOUS | Status: AC
  Administered 2019-07-18: 1 via NASAL
  Filled 2019-07-17 (×2): qty 22

## 2019-07-17 MED ORDER — CHLORHEXIDINE GLUCONATE 0.12 % MT SOLN: 15.0000 mL | Freq: Once | OROMUCOSAL | Status: AC

## 2019-07-17 MED ORDER — SUCCINYLCHOLINE CHLORIDE 20 MG/ML IJ SOLN
INTRAMUSCULAR | Status: DC | PRN
Start: 2019-07-17 — End: 2019-07-17
  Administered 2019-07-17: 120 mg via INTRAVENOUS

## 2019-07-17 MED ORDER — VANCOMYCIN HCL 1000 MG IV SOLR
INTRAVENOUS | Status: DC | PRN
  Administered 2019-07-17: 1000 mg

## 2019-07-17 MED ORDER — TOBRAMYCIN SULFATE 1.2 G IJ SOLR
INTRAMUSCULAR | Status: DC | PRN
  Administered 2019-07-17: 1.2 g

## 2019-07-17 MED ORDER — ACETAMINOPHEN 10 MG/ML IV SOLN: 1000.0000 mg | Freq: Once | INTRAVENOUS | Status: DC | PRN

## 2019-07-17 MED ORDER — PROMETHAZINE HCL 25 MG/ML IJ SOLN: 6.2500 mg | INTRAMUSCULAR | Status: DC | PRN

## 2019-07-17 MED ORDER — PHENYLEPHRINE HCL (PRESSORS) 10 MG/ML IV SOLN
INTRAVENOUS | Status: DC | PRN
Start: 2019-07-17 — End: 2019-07-17
  Administered 2019-07-17 (×2): 120 ug via INTRAVENOUS

## 2019-07-17 MED ORDER — PRO-STAT SUGAR FREE PO LIQD
30.0000 mL | Freq: Two times a day (BID) | ORAL | Status: DC
  Administered 2019-07-17 – 2019-07-28 (×9): 30 mL via ORAL
  Filled 2019-07-17 (×20): qty 30

## 2019-07-17 MED ORDER — VANCOMYCIN HCL 1000 MG IV SOLR
INTRAVENOUS | Status: AC
  Filled 2019-07-17: qty 1000

## 2019-07-17 MED ORDER — HEPARIN (PORCINE) 25000 UT/250ML-% IV SOLN
2000.0000 [IU]/h | INTRAVENOUS | Status: DC
  Administered 2019-07-17: 1900 [IU]/h via INTRAVENOUS
  Administered 2019-07-18 – 2019-07-21 (×7): 2000 [IU]/h via INTRAVENOUS
  Filled 2019-07-17 (×7): qty 250

## 2019-07-17 MED ORDER — BUPIVACAINE HCL (PF) 0.25 % IJ SOLN
INTRAMUSCULAR | Status: AC
  Filled 2019-07-17: qty 30

## 2019-07-17 SURGICAL SUPPLY — 60 items
BRUSH FEMORAL CANAL (MISCELLANEOUS) IMPLANT
BRUSH SCRUB EZ PLAIN DRY (MISCELLANEOUS) ×6 IMPLANT
CANISTER WOUNDNEG PRESSURE 500 (CANNISTER) ×3 IMPLANT
COVER SURGICAL LIGHT HANDLE (MISCELLANEOUS) ×6 IMPLANT
COVER WAND RF STERILE (DRAPES) ×3 IMPLANT
DRAPE C-ARMOR (DRAPES) ×3 IMPLANT
DRAPE IMP U-DRAPE 54X76 (DRAPES) ×3 IMPLANT
DRAPE INCISE IOBAN 66X45 STRL (DRAPES) IMPLANT
DRAPE ORTHO SPLIT 77X108 STRL (DRAPES) ×6
DRAPE SURG ORHT 6 SPLT 77X108 (DRAPES) ×4 IMPLANT
DRAPE U-SHAPE 47X51 STRL (DRAPES) ×3 IMPLANT
DRSG PAD ABDOMINAL 8X10 ST (GAUZE/BANDAGES/DRESSINGS) ×3 IMPLANT
DRSG TEGADERM 2-3/8X2-3/4 SM (GAUZE/BANDAGES/DRESSINGS) ×9 IMPLANT
ELECT BLADE 6.5 EXT (BLADE) ×3 IMPLANT
ELECT CAUTERY BLADE 6.4 (BLADE) ×3 IMPLANT
ELECT REM PT RETURN 9FT ADLT (ELECTROSURGICAL) ×3
ELECTRODE REM PT RTRN 9FT ADLT (ELECTROSURGICAL) ×2 IMPLANT
EVACUATOR 1/8 PVC DRAIN (DRAIN) IMPLANT
GAUZE SPONGE 4X4 12PLY STRL (GAUZE/BANDAGES/DRESSINGS) ×3 IMPLANT
GAUZE XEROFORM 5X9 LF (GAUZE/BANDAGES/DRESSINGS) ×3 IMPLANT
GLOVE BIO SURGEON STRL SZ7.5 (GLOVE) ×3 IMPLANT
GLOVE BIO SURGEON STRL SZ8 (GLOVE) ×3 IMPLANT
GLOVE BIOGEL PI IND STRL 7.5 (GLOVE) ×2 IMPLANT
GLOVE BIOGEL PI IND STRL 8 (GLOVE) ×2 IMPLANT
GLOVE BIOGEL PI INDICATOR 7.5 (GLOVE) ×1
GLOVE BIOGEL PI INDICATOR 8 (GLOVE) ×1
GOWN STRL REUS W/ TWL XL LVL3 (GOWN DISPOSABLE) ×2 IMPLANT
GOWN STRL REUS W/TWL 2XL LVL3 (GOWN DISPOSABLE) ×9 IMPLANT
GOWN STRL REUS W/TWL XL LVL3 (GOWN DISPOSABLE) ×3
HANDPIECE INTERPULSE COAX TIP (DISPOSABLE)
KIT STIMULAN RAPID CURE  10CC (Orthopedic Implant) ×3 IMPLANT
KIT STIMULAN RAPID CURE 10CC (Orthopedic Implant) ×2 IMPLANT
KIT TURNOVER KIT B (KITS) ×3 IMPLANT
MANIFOLD NEPTUNE II (INSTRUMENTS) ×3 IMPLANT
NEEDLE 22X1 1/2 (OR ONLY) (NEEDLE) IMPLANT
NS IRRIG 1000ML POUR BTL (IV SOLUTION) ×3 IMPLANT
PACK TOTAL JOINT (CUSTOM PROCEDURE TRAY) ×3 IMPLANT
PACK UNIVERSAL I (CUSTOM PROCEDURE TRAY) ×3 IMPLANT
PAD ARMBOARD 7.5X6 YLW CONV (MISCELLANEOUS) ×6 IMPLANT
PENCIL BUTTON HOLSTER BLD 10FT (ELECTRODE) ×3 IMPLANT
PILLOW ABDUCTION MEDIUM (MISCELLANEOUS) IMPLANT
PREVENA INCISION MGT 90 150 (MISCELLANEOUS) ×3 IMPLANT
SET HNDPC FAN SPRY TIP SCT (DISPOSABLE) IMPLANT
SPONGE LAP 18X18 RF (DISPOSABLE) IMPLANT
STAPLER VISISTAT 35W (STAPLE) ×3 IMPLANT
SUT ETHILON 2 0 FS 18 (SUTURE) ×3 IMPLANT
SUT PDS AB 0 CT 36 (SUTURE) ×3 IMPLANT
SUT PDS AB 2-0 CT1 27 (SUTURE) ×3 IMPLANT
SUT VIC AB 0 CT1 27 (SUTURE) ×6
SUT VIC AB 0 CT1 27XBRD ANBCTR (SUTURE) ×4 IMPLANT
SUT VIC AB 1 CT1 27 (SUTURE) ×3
SUT VIC AB 1 CT1 27XBRD ANBCTR (SUTURE) ×2 IMPLANT
SUT VIC AB 2-0 CT1 27 (SUTURE) ×3
SUT VIC AB 2-0 CT1 TAPERPNT 27 (SUTURE) ×2 IMPLANT
SYR CONTROL 10ML LL (SYRINGE) ×3 IMPLANT
TOWEL GREEN STERILE (TOWEL DISPOSABLE) ×6 IMPLANT
TOWEL GREEN STERILE FF (TOWEL DISPOSABLE) ×3 IMPLANT
TOWER CARTRIDGE SMART MIX (DISPOSABLE) IMPLANT
TRAY FOLEY MTR SLVR 16FR STAT (SET/KITS/TRAYS/PACK) IMPLANT
WATER STERILE IRR 1000ML POUR (IV SOLUTION) ×12 IMPLANT
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
## 2019-07-17 NOTE — Anesthesia Preprocedure Evaluation (Addendum)
Anesthesia Evaluation  Patient identified by MRN, date of birth, ID band Patient awake    Reviewed: Allergy & Precautions, NPO status , Patient's Chart, lab work & pertinent test results  Airway Mallampati: III  TM Distance: >3 FB Neck ROM: Full    Dental no notable dental hx.    Pulmonary former smoker,    Pulmonary exam normal breath sounds clear to auscultation       Cardiovascular hypertension, Pt. on medications and Pt. on home beta blockers + CAD and + Past MI  Normal cardiovascular exam Rhythm:Regular Rate:Normal  ECG: ST, rate 105  ECHO: 1. Left ventricular ejection fraction, by estimation, is 60 to 65%. The left ventricle has normal function. Left ventricular endocardial border not optimally defined to evaluate regional wall motion. Left ventricular diastolic function could not be evaluated. 2. Right ventricular systolic function is normal. The right ventricular size is normal. Tricuspid regurgitation signal is inadequate for assessing PA pressure. 3. The mitral valve is normal in structure. Trivial mitral valve regurgitation. No evidence of mitral stenosis. 4. The aortic valve has been repaired/replaced. Aortic valve regurgitation is not visualized. No aortic stenosis is present. 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 Echo findings are consistent with normal structure and function of the aortic valve prosthesis. No evidence of perivavluar AI. 5. The inferior vena cava is normal in size with greater than 50% respiratory variability, suggesting right atrial pressure of 3 mmHg.   Neuro/Psych Vasovagal syndrome No Residual Symptoms negative psych ROS   GI/Hepatic Neg liver ROS, GERD  Medicated and Controlled,  Endo/Other  diabetes, Oral Hypoglycemic AgentsMorbid obesity  Renal/GU ESRF and DialysisRenal diseaseHistory of peritoneal dialysis      Musculoskeletal negative musculoskeletal ROS (+)   Abdominal (+) + obese,   Peds  Hematology  (+) anemia , HLD   Anesthesia Other Findings Left Hip Abscess  Reproductive/Obstetrics                            Anesthesia Physical Anesthesia Plan  ASA: IV  Anesthesia Plan: General   Post-op Pain Management:    Induction: Intravenous  PONV Risk Score and Plan: 2 and Ondansetron, Dexamethasone and Treatment may vary due to age or medical condition  Airway Management Planned: LMA  Additional Equipment:   Intra-op Plan:   Post-operative Plan: Extubation in OR  Informed Consent: I have reviewed the patients History and Physical, chart, labs and discussed the procedure including the risks, benefits and alternatives for the proposed anesthesia with the patient or authorized representative who has indicated his/her understanding and acceptance.     Dental advisory given  Plan Discussed with: CRNA  Anesthesia Plan Comments:        Anesthesia Quick Evaluation

## 2019-07-17 NOTE — Progress Notes (Signed)
  [facility] Progress Note   Assessment/ Plan:   1. MSSA bacteremia: in setting of L hip hardware infection and R finger osteo.  On Cefazolin.  TEE pending.  Repeat I and D today.  ID on board 2.ESRD continue PD--> ? If he's a long term candidate 3. Anemia: continue ESA 4. CKD-MBD: binders and renal vitamins 5. Nutrition: needs more protein 6. Hypertension: reasonably well controlled  Subjective:    Seen in room.  S/p repeat I and D of L hip today.  ID recommending TEE too.     Objective:   BP 140/83   Pulse 92   Temp 97.7 F (36.5 C)   Resp 17   Ht 5\' 9"  (1.753 m)   Wt 127 kg   SpO2 96%   BMI 41.35 kg/m   Physical Exam: Gen: pale, NAD CVS: RRR Resp: clear Abd:PD cath in place Ext: no LE edema ACCESS: R IJ TDC removed  Labs: BMET Recent Labs  Lab 07/13/19 1405 07/14/19 2048  NA 127* 133*  K 3.7 4.0  CL 87* 95*  CO2 25 22  GLUCOSE 443* 103*  BUN 32* 46*  CREATININE 8.23* 10.23*  CALCIUM 8.8* 8.6*   CBC Recent Labs  Lab 07/13/19 1405 07/16/19 0417 07/17/19 0718  WBC 13.2* 13.6* 16.8*  NEUTROABS 10.6*  --   --   HGB 10.0* 8.0* 8.0*  HCT 33.3* 25.8* 26.0*  MCV 92.5 90.2 90.0  PLT 296 264 256      Medications:    . atorvastatin  40 mg Oral q1800  . buPROPion  150 mg Oral BID WC  . Chlorhexidine Gluconate Cloth  6 each Topical Daily  . darbepoetin (ARANESP) injection - NON-DIALYSIS  100 mcg Subcutaneous Q Sun-1800  . gentamicin cream  1 application Topical Daily  . insulin aspart  0-9 Units Subcutaneous Q6H  . mupirocin ointment   Nasal BID  . pregabalin  50 mg Oral QHS  . sevelamer carbonate  2,400 mg Oral TID WC     Madelon Lips MD 07/17/2019, 3:53 PM

## 2019-07-17 NOTE — Transfer of Care (Signed)
Immediate Anesthesia Transfer of Care Note  Patient: Max Pittman.  Procedure(s) Performed: REPEAT IRRIGATION AND DEBRIDEMENT LEFT HIP (Left Hip) REMOVAL OF HEMODIALYSIS DIALYSIS CATHETER (Right Neck) WOUND VAC CHANGE (Left Hip)  Patient Location: PACU  Anesthesia Type:General  Level of Consciousness: awake, alert  and oriented  Airway & Oxygen Therapy: Patient Spontanous Breathing and Patient connected to nasal cannula oxygen  Post-op Assessment: Report given to RN, Post -op Vital signs reviewed and stable and Patient moving all extremities X 4  Post vital signs: Reviewed and stable  Last Vitals:  Vitals Value Taken Time  BP 142/67 07/17/19 1439  Temp    Pulse 94 07/17/19 1441  Resp 20 07/17/19 1441  SpO2 96 % 07/17/19 1441  Vitals shown include unvalidated device data.  Last Pain:  Vitals:   07/17/19 1153  TempSrc:   PainSc: 9       Patients Stated Pain Goal: 5 (67/01/41 0301)  Complications: No complications documented.

## 2019-07-17 NOTE — Anesthesia Procedure Notes (Signed)
Procedure Name: Intubation Date/Time: 07/17/2019 1:36 PM Performed by: Inda Coke, CRNA Pre-anesthesia Checklist: Patient identified, Emergency Drugs available, Suction available and Patient being monitored Patient Re-evaluated:Patient Re-evaluated prior to induction Oxygen Delivery Method: Circle System Utilized Preoxygenation: Pre-oxygenation with 100% oxygen Induction Type: IV induction Ventilation: Mask ventilation without difficulty Laryngoscope Size: Mac and 4 Grade View: Grade I Tube type: Oral Tube size: 7.5 mm Number of attempts: 1 Airway Equipment and Method: Stylet and Oral airway Placement Confirmation: ETT inserted through vocal cords under direct vision,  positive ETCO2 and breath sounds checked- equal and bilateral Secured at: 22 cm Tube secured with: Tape Dental Injury: Teeth and Oropharynx as per pre-operative assessment

## 2019-07-17 NOTE — Progress Notes (Signed)
[facility] NOTE   Pharmacy Consult for Heparin Indication: DVT  Allergies  Allergen Reactions   Doxycycline Hives   Latex Swelling    Pt reports swelling at site.    Morphine And Related Anxiety    Patient Measurements: Height: 5\' 9"  (175.3 cm) Weight: 120 kg (264 lb 8.8 oz) IBW/kg (Calculated) : 70.7 Heparin Dosing Weight: 96.7 kg  Vital Signs: Temp: 98.2 F (36.8 C) (06/21 2045) Temp Source: Oral (06/21 2045) BP: 138/79 (06/21 2045) Pulse Rate: 102 (06/21 2045)  Labs: Recent Labs    07/15/19 1011 07/15/19 1616 07/16/19 0417 07/16/19 1423 07/16/19 2242 07/17/19 0718 07/17/19 2216  HGB  --   --  8.0*  --   --  8.0*  --   HCT  --   --  25.8*  --   --  26.0*  --   PLT  --   --  264  --   --  256  --   LABPROT 17.0*  --  15.5*  --   --  16.0*  --   INR 1.4*  --  1.3*  --   --  1.3*  --   HEPARINUNFRC  --    < > 0.21*   < > 0.38 0.52 0.26*   < > = values in this interval not displayed.    Estimated Creatinine Clearance: 11.9 mL/min (A) (by C-G formula based on SCr of 10.23 mg/dL (H)).   Medical History: Past Medical History:  Diagnosis Date   Arthritis    Closed dislocation of left hip ([place]) 05/21/2019   Diabetes ([place])    Diabetes mellitus without complication ([place])    History of peritoneal dialysis    Hypertension    Renal disorder    FFGS   Renal disorder    Stroke ([place])    when ha was a child   Vasovagal syndrome    with syncope    Medications:  Medications Prior to Admission  Medication Sig Dispense Refill Last Dose   acetaminophen (TYLENOL) 325 MG tablet Take 2 tablets (650 mg total) by mouth every 8 (eight) hours.   07/12/2019 at Unknown time   amLODipine (NORVASC) 5 MG tablet Take 1 tablet (5 mg total) by mouth daily. 30 tablet 0 07/12/2019 at Unknown time   aspirin EC 81 MG EC tablet Take 1 tablet (81 mg total) by mouth daily.   07/12/2019 at Unknown time   atorvastatin (LIPITOR) 40 MG tablet Take 1 tablet (40  mg total) by mouth daily at 6 PM. 31 tablet 6 07/12/2019 at Unknown time   buPROPion (WELLBUTRIN SR) 150 MG 12 hr tablet Take 1 tablet (150 mg total) by mouth 2 (two) times daily with a meal. 60 tablet 0 07/12/2019 at Unknown time   cholecalciferol (VITAMIN D) 25 MCG tablet Take 2 tablets (2,000 Units total) by mouth 2 (two) times daily. 60 tablet 0 07/12/2019 at Unknown time   docusate sodium (COLACE) 100 MG capsule Take 300 mg by mouth daily as needed for mild constipation or moderate constipation. As directed    Past Month at Unknown time   HYDROcodone-acetaminophen (NORCO/VICODIN) 5-325 MG tablet Take 1 tablet by mouth every 8 (eight) hours as needed.   Past Week at Unknown time   loratadine (CLARITIN) 10 MG tablet Take 1 tablet (10 mg total) by mouth daily. 30 tablet 0 07/12/2019 at Unknown time   methocarbamol (ROBAXIN) 500 MG tablet Take 2 tablets (1,000 mg total) by mouth 3 (three) times  daily. 90 tablet 0 07/11/2019 at Unknown time   metoprolol tartrate (LOPRESSOR) 25 MG tablet Take 0.5 tablets (12.5 mg total) by mouth 2 (two) times daily. 30 tablet 0 07/12/2019 at 2000   omeprazole (PRILOSEC) 20 MG capsule Take 1 capsule (20 mg total) by mouth daily. 30 capsule 0 07/12/2019 at Unknown time   pioglitazone (ACTOS) 15 MG tablet Take 1 tablet (15 mg total) by mouth daily. 30 tablet 0 07/12/2019 at Unknown time   pregabalin (LYRICA) 50 MG capsule Take 1 capsule (50 mg total) by mouth at bedtime. 30 capsule 0 07/12/2019 at Unknown time   sevelamer carbonate (RENVELA) 800 MG tablet Take 3 tablets (2,400 mg total) by mouth 3 (three) times daily with meals. 240 tablet 1 07/12/2019 at Unknown time   warfarin (COUMADIN) 2 MG tablet Take 1 tablet (2 mg total) by mouth daily. Or as directed by [facility] (Patient taking differently: Take 2 mg by mouth every evening. ) 30 tablet 4 07/12/2019 at 2000   oxyCODONE (OXYCONTIN) 10 mg 12 hr tablet Take 1 tablet (10 mg total) by mouth daily. (Patient not  taking: Reported on 07/13/2019) 14 tablet 0 Not Taking at Unknown time    Assessment: Patient is post op day 1 with wound vac and peritoneal dialysis.  On warfarin for DVT which was stopped for surgery.  Received phytonadione 5 mg IV 07/14/19 0037.  Heparin infusion per pharmacy consult ordered today with plans for Orthopedic Surgery to dictate when to start warfarin.  Patient was previously on a heparin infusion last week and a heparin level of 0.26 was achieved with heparin infusing at 1800 units/hr. D/W MD, lower heparin goal is not needed, therefore will increase goal to 0.3-0.7 once restarted.  Heparin level this evening 0.26.  Per RN no known issues with IV infusion.   Goal of Therapy:  Heparin Level goal = 0.3-0.7 Monitor platelets by anticoagulation protocol: Yes   Plan:  Increase IV heparin to 2000 units/hr Will recheck with AM labs. Heparin level daily  [place] Crane, Roylene Reason, [facility] Clinical Pharmacist  07/17/2019 11:01 PM   [facility] pharmacy phone numbers are listed on [email or web address]

## 2019-07-17 NOTE — Progress Notes (Signed)
[place] for Infectious Disease  Date of Admission:  07/13/2019      Total days of antibiotics 5  Day 4 cefazolin           ASSESSMENT: Max Pittman. is a 44 y.o. male with MSSA bacteremia complicated by infected left hip hardware with abscess and chronic indwelling HD line.   Will need to remove HD line once cleared to do so from nephrology given it is considered contaminated. PD seems to be going well so hopeful further HD access for now can be avoided/delayed. If he continues with home PD will need to arranged tunneled PICC for antibiotics before discharge.   Will need TEE for him given history of TAVR valve to ensure no prosthetic valve endocarditis is present.   Once he is stable from hip perspective, will need to have hand surgery see him for right finger osteomyelitis - we discussed that this often requires surgery for consideration of debridement vs amputation to cure infection. Would also be helpful to send tissue here for culture to identify other possible pathogens here.   Will continue cefazolin alone for now.      PLAN: 1. IR to remove HD line once OK from nephrology team  2. Continue Cefazolin  3. Will need TEE 4. Hand surgery re: right finger osteo 5. Follow for ongoing surgical interventions.    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 atorvastatin  40 mg Oral q1800  . buPROPion  150 mg Oral BID WC  . Chlorhexidine Gluconate Cloth  6 each Topical Daily  . darbepoetin (ARANESP) injection - NON-DIALYSIS  100 mcg Subcutaneous Q Sun-1800  . gentamicin cream  1 application Topical  Daily  . insulin aspart  0-9 Units Subcutaneous Q6H  . mupirocin ointment   Nasal BID  . pregabalin  50 mg Oral QHS  . sevelamer carbonate  2,400 mg Oral TID WC    SUBJECTIVE: Had a good weekend. No complaints today.    Review of Systems: Review of Systems  Constitutional: Negative for chills, fever and malaise/fatigue.  Respiratory: Negative for cough and shortness of breath.   Cardiovascular: Positive for leg swelling (L hip). Negative for chest pain.  Gastrointestinal: Negative for abdominal pain, diarrhea, nausea and vomiting.  Musculoskeletal: Positive for joint pain.       L hip pain overall improved.  No further knee pain.   Neurological: Negative for dizziness and weakness.    Allergies  Allergen Reactions  . Doxycycline Hives  . Latex Swelling    Pt reports swelling at site.   . Morphine And Related Anxiety    OBJECTIVE: Vitals:   07/16/19 1000 07/16/19 1800 07/16/19 2155 07/17/19 0916  BP: 138/82 (!) 150/82 139/87 (!) 142/95  Pulse: (!) 106 (!) 104 (!) 102 98  Resp:   18 18  Temp: 99 F (37.2 C) 99.1 F (37.3 C) 98 F (36.7 C) 98.6 F (37 C)  TempSrc: Oral Oral Oral Oral  SpO2: 93% 95% 96% 98%  Weight:   127 kg   Height:  Body mass index is 41.35 kg/m.  Physical Exam Constitutional:      Appearance: Normal appearance.     Comments: Resting comfortably in bed.   HENT:     Mouth/Throat:     Mouth: Mucous membranes are moist.  Eyes:     General: No scleral icterus. Cardiovascular:     Rate and Rhythm: Normal rate and regular rhythm.     Heart sounds: No murmur heard.   Pulmonary:     Effort: Pulmonary effort is normal.     Breath sounds: Normal breath sounds.  Chest:     Comments: R chest HD catheter without tenderness. No signs of infection.  Abdominal:     General: Bowel sounds are normal. There is distension (PD ongoing).  Musculoskeletal:     Comments: L hip with clean sponge in place. Serous-red tinged drainage in cannister. No  peri wound erythema. L knee is normal appearing, no redness or TTP.   R distal pointer finger remains swollen and red with chronic ulceration. No drainage.   Skin:    General: Skin is warm and dry.     Capillary Refill: Capillary refill takes less than 2 seconds.  Neurological:     Mental Status: He is alert and oriented to person, place, and time.     Lab Results Lab Results  Component Value Date   WBC 16.8 (H) 07/17/2019   HGB 8.0 (L) 07/17/2019   HCT 26.0 (L) 07/17/2019   MCV 90.0 07/17/2019   PLT 256 07/17/2019    Lab Results  Component Value Date   CREATININE 10.23 (H) 07/14/2019   BUN 46 (H) 07/14/2019   NA 133 (L) 07/14/2019   K 4.0 07/14/2019   CL 95 (L) 07/14/2019   CO2 22 07/14/2019    Lab Results  Component Value Date   ALT 11 07/13/2019   AST 14 (L) 07/13/2019   ALKPHOS 122 07/13/2019   BILITOT 0.6 07/13/2019     Microbiology: Recent Results (from the past 240 hour(s))  Blood culture (routine x 2)     Status: Abnormal   Collection Time: 07/13/19  2:06 PM   Specimen: BLOOD RIGHT ARM  Result Value Ref Range Status   Specimen Description   Final    BLOOD RIGHT ARM Performed at [facility], [address] [place], [place] [address]    Special Requests   Final    BOTTLES DRAWN AEROBIC AND ANAEROBIC Blood Culture adequate volume Performed at [facility], [address], [place], [place] [address]    Culture  Setup Time   Final    IN BOTH AEROBIC AND ANAEROBIC BOTTLES GRAM POSITIVE COCCI Gram Stain Report Called to,Read Back By and Verified With: C MARSHALL,RN @0311  07/14/19 MKELLY CRITICAL RESULT CALLED TO, READ BACK BY AND VERIFIED WITH: Roxy Manns  [id number] FCP Performed at [facility], [place] [address], [place], [place] [address]    Culture STAPHYLOCOCCUS AUREUS (A)  Final   Report Status 07/16/2019 FINAL  Final   Organism ID, Bacteria STAPHYLOCOCCUS AUREUS  Final      Susceptibility   Staphylococcus aureus - MIC*    CIPROFLOXACIN  <=0.5 SENSITIVE Sensitive     ERYTHROMYCIN <=0.25 SENSITIVE Sensitive     GENTAMICIN <=0.5 SENSITIVE Sensitive     OXACILLIN 0.5 SENSITIVE Sensitive     TETRACYCLINE <=1 SENSITIVE Sensitive     VANCOMYCIN <=0.5 SENSITIVE Sensitive     TRIMETH/SULFA <=10 SENSITIVE Sensitive     CLINDAMYCIN <=  0.25 SENSITIVE Sensitive     RIFAMPIN <=0.5 SENSITIVE Sensitive     Inducible Clindamycin NEGATIVE Sensitive     * STAPHYLOCOCCUS AUREUS  Blood Culture ID Panel (Reflexed)     Status: Abnormal   Collection Time: 07/13/19  2:06 PM  Result Value Ref Range Status   Enterococcus species NOT DETECTED NOT DETECTED Final   Listeria monocytogenes NOT DETECTED NOT DETECTED Final   Staphylococcus species DETECTED (A) NOT DETECTED Final    Comment: CRITICAL RESULT CALLED TO, READ BACK BY AND VERIFIED WITH: Roxy Manns  [id number] FCP    Staphylococcus aureus (BCID) DETECTED (A) NOT DETECTED Final    Comment: Methicillin (oxacillin) susceptible Staphylococcus aureus (MSSA). Preferred therapy is anti staphylococcal beta lactam antibiotic (Cefazolin or Nafcillin), unless clinically contraindicated. CRITICAL RESULT CALLED TO, READ BACK BY AND VERIFIED WITH: Roxy Manns  [id number] FCP    Methicillin resistance NOT DETECTED NOT DETECTED Final   Streptococcus species NOT DETECTED NOT DETECTED Final   Streptococcus agalactiae NOT DETECTED NOT DETECTED Final   Streptococcus pneumoniae NOT DETECTED NOT DETECTED Final   Streptococcus pyogenes NOT DETECTED NOT DETECTED Final   Acinetobacter baumannii NOT DETECTED NOT DETECTED Final   Enterobacteriaceae species NOT DETECTED NOT DETECTED Final   Enterobacter cloacae complex NOT DETECTED NOT DETECTED Final   Escherichia coli NOT DETECTED NOT DETECTED Final   Klebsiella oxytoca NOT DETECTED NOT DETECTED Final   Klebsiella pneumoniae NOT DETECTED NOT DETECTED Final   Proteus species NOT DETECTED NOT DETECTED Final   Serratia marcescens NOT DETECTED NOT  DETECTED Final   Haemophilus influenzae NOT DETECTED NOT DETECTED Final   Neisseria meningitidis NOT DETECTED NOT DETECTED Final   Pseudomonas aeruginosa NOT DETECTED NOT DETECTED Final   Candida albicans NOT DETECTED NOT DETECTED Final   Candida glabrata NOT DETECTED NOT DETECTED Final   Candida krusei NOT DETECTED NOT DETECTED Final   Candida parapsilosis NOT DETECTED NOT DETECTED Final   Candida tropicalis NOT DETECTED NOT DETECTED Final    Comment: Performed at [facility], [address] [place], [place] [address]  Blood culture (routine x 2)     Status: Abnormal   Collection Time: 07/13/19  3:02 PM   Specimen: BLOOD RIGHT ARM  Result Value Ref Range Status   Specimen Description   Final    BLOOD RIGHT ARM Performed at [facility], [address], [place], [place] [address]    Special Requests   Final    BOTTLES DRAWN AEROBIC AND ANAEROBIC Blood Culture adequate volume Performed at [facility], [address] [place], [place] [address]    Culture  Setup Time   Final    AEROBIC BOTTLE ONLY GRAM POSITIVE COCCI Gram Stain Report Called to,Read Back By and Verified With: C MARSHALL,RN @0439  07/14/19 MKELLY GRAM POSITIVE COCCI ANAEROBIC BOTTLE ONLY RESULT PREV. CALLED  Performed at [facility], [address], [place], [place] [address]    Culture (A)  Final    STAPHYLOCOCCUS AUREUS SUSCEPTIBILITIES PERFORMED ON PREVIOUS CULTURE WITHIN THE LAST 5 DAYS. Performed at [facility], [place] [address], [place], [place] [address]    Report Status 07/16/2019 FINAL  Final  SARS Coronavirus 2 by RT PCR (hospital order, performed in [facility] hospital lab) Nasopharyngeal Nasopharyngeal Swab     Status: None   Collection Time: 07/13/19  6:10 PM   Specimen: Nasopharyngeal Swab  Result Value Ref Range Status   SARS Coronavirus 2 NEGATIVE NEGATIVE Final  Comment: (NOTE) SARS-CoV-2 target nucleic acids are NOT DETECTED.  The SARS-CoV-2 RNA is generally detectable in upper  and lower respiratory specimens during the acute phase of infection. The lowest concentration of SARS-CoV-2 viral copies this assay can detect is 250 copies / mL. A negative result does not preclude SARS-CoV-2 infection and should not be used as the sole basis for treatment or other patient management decisions.  A negative result may occur with improper specimen collection / handling, submission of specimen other than nasopharyngeal swab, presence of viral mutation(s) within the areas targeted by this assay, and inadequate number of viral copies (<250 copies / mL). A negative result must be combined with clinical observations, patient history, and epidemiological information.  Fact Sheet for Patients:   StrictlyIdeas.no  Fact Sheet for Healthcare Providers: BankingDealers.co.za  This test is not yet approved or  cleared by the [place] FDA and has been authorized for detection and/or diagnosis of SARS-CoV-2 by FDA under an Emergency Use Authorization (EUA).  This EUA will remain in effect (meaning this test can be used) for the duration of the COVID-19 declaration under Section 564(b)(1) of the Act, 21 U.S.C. section 360bbb-3(b)(1), unless the authorization is terminated or revoked sooner.  Performed at [facility], [address] [place], [place] [address]   Aerobic/Anaerobic Culture (surgical/deep wound)     Status: None (Preliminary result)   Collection Time: 07/14/19  3:28 PM   Specimen: PATH Other; Tissue  Result Value Ref Range Status   Specimen Description WOUND LEFT HIP INCISION  Final   Special Requests NONE  Final   Gram Stain   Final    ABUNDANT WBC PRESENT, PREDOMINANTLY PMN FEW GRAM POSITIVE COCCI Performed at [facility], [place] [address] [place], [place] [address]    Culture   Final    FEW STAPHYLOCOCCUS AUREUS NO ANAEROBES ISOLATED; CULTURE IN PROGRESS FOR 5 DAYS    Report Status PENDING  Incomplete     Organism ID, Bacteria STAPHYLOCOCCUS AUREUS  Final      Susceptibility   Staphylococcus aureus - MIC*    CIPROFLOXACIN <=0.5 SENSITIVE Sensitive     ERYTHROMYCIN <=0.25 SENSITIVE Sensitive     GENTAMICIN <=0.5 SENSITIVE Sensitive     OXACILLIN 0.5 SENSITIVE Sensitive     TETRACYCLINE <=1 SENSITIVE Sensitive     VANCOMYCIN <=0.5 SENSITIVE Sensitive     TRIMETH/SULFA <=10 SENSITIVE Sensitive     CLINDAMYCIN <=0.25 SENSITIVE Sensitive     RIFAMPIN <=0.5 SENSITIVE Sensitive     Inducible Clindamycin NEGATIVE Sensitive     * FEW STAPHYLOCOCCUS AUREUS  Culture, blood (Routine X 2) w Reflex to ID Panel     Status: None (Preliminary result)   Collection Time: 07/15/19  7:24 AM   Specimen: BLOOD RIGHT ARM  Result Value Ref Range Status   Specimen Description BLOOD RIGHT ARM  Final   Special Requests   Final    AEROBIC BOTTLE ONLY Blood Culture results may not be optimal due to an inadequate volume of blood received in culture bottles   Culture   Final    NO GROWTH 2 DAYS Performed at [facility], [place] [address], [place], [place] [address]    Report Status PENDING  Incomplete  Culture, blood (Routine X 2) w Reflex to ID Panel     Status: None (Preliminary result)   Collection Time: 07/15/19  7:33 AM   Specimen: BLOOD RIGHT HAND  Result Value Ref Range Status   Specimen Description  BLOOD RIGHT HAND  Final   Special Requests   Final    BOTTLES DRAWN AEROBIC AND ANAEROBIC Blood Culture adequate volume   Culture   Final    NO GROWTH 2 DAYS Performed at [facility], [address], [place], [place] [address]    Report Status PENDING  Incomplete  Surgical pcr screen     Status: Abnormal   Collection Time: 07/17/19  7:49 AM   Specimen: Nasal Mucosa; Nasal Swab  Result Value Ref Range Status   MRSA, PCR NEGATIVE NEGATIVE Final   Staphylococcus aureus POSITIVE (A) NEGATIVE Final    Comment: (NOTE) The Xpert SA Assay (FDA approved for NASAL specimens in patients 68 years of  age and older), is one component of a comprehensive surveillance program. It is not intended to diagnose infection nor to guide or monitor treatment. Performed at [facility], [place] [address] [place], [place] [address]     Janene Madeira, MSN, NP-C [place] for Infectious Disease [email or web address] Pager: [phone] Office: [place]: [phone]

## 2019-07-17 NOTE — Brief Op Note (Signed)
07/17/2019  2:48 PM  PATIENT:  Max Pittman.  44 y.o. male  PRE-OPERATIVE DIAGNOSIS:  Left Hip Abscess  POST-OPERATIVE DIAGNOSIS:  Left Hip Abscess  PROCEDURE:  Procedure(s): REPEAT IRRIGATION AND DEBRIDEMENT LEFT HIP (Left) REMOVAL OF HEMODIALYSIS DIALYSIS CATHETER (Right) WOUND VAC CHANGE (Left)  PLACEMENT OF ANTIBIOTIC BEADS  SURGEON:  Surgeon(s) and Role:    [place]e [place], MD - Primary  PHYSICIAN ASSISTANT: 1. KEITH PAUL, PA-C;  2. PA Student  ANESTHESIA:   general  EBL:  5 mL   BLOOD ADMINISTERED:none  DRAINS: none   LOCAL MEDICATIONS USED:  NONE  SPECIMEN:  Source of Specimen:  catheter tip  DISPOSITION OF SPECIMEN:  micro  COUNTS:  YES  TOURNIQUET:  * No tourniquets in log *  DICTATION: [id number]  PLAN OF CARE: Admit to inpatient   PATIENT DISPOSITION:  PACU - hemodynamically stable.   Delay start of Pharmacological VTE agent (>24hrs) due to surgical blood loss or risk of bleeding: no

## 2019-07-17 NOTE — Anesthesia Postprocedure Evaluation (Signed)
Anesthesia Post Note  Patient: Max Pittman.  Procedure(s) Performed: REPEAT IRRIGATION AND DEBRIDEMENT LEFT HIP (Left Hip) REMOVAL OF HEMODIALYSIS DIALYSIS CATHETER (Right Neck) WOUND VAC CHANGE (Left Hip)     Patient location during evaluation: PACU Anesthesia Type: General Level of consciousness: awake Pain management: pain level controlled Vital Signs Assessment: post-procedure vital signs reviewed and stable Respiratory status: spontaneous breathing, nonlabored ventilation, respiratory function stable and patient connected to nasal cannula oxygen Cardiovascular status: blood pressure returned to baseline and stable Postop Assessment: no apparent nausea or vomiting Anesthetic complications: no   No complications documented.  Last Vitals:  Vitals:   07/17/19 1505 07/17/19 1509  BP:  140/83  Pulse: 92 92  Resp: 19 17  Temp:  36.5 C  SpO2: 95% 96%    Last Pain:  Vitals:   07/17/19 1509  TempSrc:   PainSc: 0-No pain                 Kacelyn Rowzee P Danaye Sobh

## 2019-07-17 NOTE — Progress Notes (Signed)
Returns to the OR today for removal of wound vac, probable application of antibiotic beads.  I discussed with the patient the risks and benefits of surgery, including the possibility of infection, nerve injury, vessel injury, wound breakdown, arthritis, , DVT/ PE, loss of motion, malunion, nonunion, and need for further surgery among others. He acknowledged these risks and wished to proceed.  Max Lake Annette, MD [facility], [facility] [phone]

## 2019-07-17 NOTE — Op Note (Signed)
NAME: Max JR., [place] WI:[id number] ACCOUNT [id number] DATE OF BIRTH:1976/01/07 FACILITY: [facility] LOCATION[facility] PHYSICIAN:Kyarra Vancamp H. Raidon Swanner, MD  OPERATIVE REPORT  DATE OF PROCEDURE:  07/17/2019  PREOPERATIVE DIAGNOSES:   1.  Left hip abscess. 2.  Contaminated internal jugular hemodialysis catheter, right chest.  POSTOPERATIVE DIAGNOSES:   1.  Left hip abscess. 2.  Contaminated internal jugular hemodialysis catheter, right chest.  PROCEDURES: 1.  Repeat irrigation and drainage of the left hip deep abscess. 2.  Placement of antibiotic beads. 3.  Wound VAC dressing change under anesthesia. 4.  Removal of hemodialysis cuffed catheter.  SURGEON:  Altamese Carlisle, MD  ASSISTANT:   1.  Ainsley Spinner, PA-C. 2.  PA student.  ANESTHESIA:  General.  ESTIMATED BLOOD LOSS:  20 mL.  DRAINS:  None.  SPECIMENS:   The catheter tip sent to micro.  DISPOSITION:  To PACU.  CONDITION:  Stable.  BRIEF SUMMARY OF INDICATIONS FOR PROCEDURE:  The patient is a 44 year old male, status post polytrauma who developed a left hip abscess.  The patient also was noted to have a transient dialysis IJ catheter placed into the chest wall that infectious  disease service felt would be likely contaminated and should be removed.  The patient has been tolerating his peritoneal dialysis and nephrology and Dr. [name] did clear him for removal of the IJ catheter.  I discussed with the patient risks and benefits  of the procedure, including the possibility of failure to resolve his infection, the need for further surgery, DVT, PE and multiple others.  He strongly wished to proceed.  BRIEF SUMMARY OF PROCEDURE:  The patient was taken to the operating room where general anesthesia was induced.  He was then placed in the Trendelenburg position with an assistant holding pressure over the IJ.  Gentle sustained force was used with  traction to remove the catheter.  The catheter tip was then sent to micro  for culture.  A gentle compressive dressing was applied.  The patient was taken out of Trendelenburg and then positioned left side up on the beanbag.  Standard prep and drape was  performed with chlorhexidine wash, Betadine scrub and paint.  The wound VAC dressing was left in place while the patient was prepped and under anesthesia.  Again, timeout was held.  The deep sponges were removed in their entirety.  There was healthy  granulation tissue present; however, a large and rather deep cavity.  A thorough irrigation was performed with 3000 mL of saline.  Antibiotic beads with vancomycin and tobramycin were placed deep into the area and then a layered closure with PDS and  nylon performed, as well as a Prevena wound VAC dressing in order to facilitate evacuation of the exudate and apposition of the wound layers.  Ainsley Spinner, PA-C, was present and working throughout, as was a Counselling psychologist.  PROGNOSIS:  The patient will begin touchdown partial weightbearing with progression to follow for the next 4 weeks until he is 3 months out from complete repair, at which time we are hopeful he is fully weightbearing.  He will continue with the strict  posterior hip precautions.  We will plan to see him back after discharge in the office in 14 days for removal of sutures.  He remains on the ID service for management of his antibiotics and final culture and sensitivities.  VN/NUANCE  D:07/17/2019 T:07/17/2019 JOB:[id number]

## 2019-07-17 NOTE — Progress Notes (Signed)
[place] for Heparin Indication: DVT  Allergies  Allergen Reactions  . Doxycycline Hives  . Latex Swelling    Pt reports swelling at site.   . Morphine And Related Anxiety    Patient Measurements: Height: 5\' 9"  (175.3 cm) Weight: 127 kg (279 lb 15.8 oz) IBW/kg (Calculated) : 70.7 Heparin Dosing Weight: 96.7 kg  Vital Signs: Temp: 98 F (36.7 C) (06/20 2155) Temp Source: Oral (06/20 2155) BP: 139/87 (06/20 2155) Pulse Rate: 102 (06/20 2155)  Labs: Recent Labs    07/14/19 2048 07/15/19 1011 07/15/19 1616 07/16/19 0417 07/16/19 0417 07/16/19 1423 07/16/19 2242 07/17/19 0718  HGB  --   --   --  8.0*  --   --   --  8.0*  HCT  --   --   --  25.8*  --   --   --  26.0*  PLT  --   --   --  264  --   --   --  256  LABPROT  --  17.0*  --  15.5*  --   --   --  16.0*  INR  --  1.4*  --  1.3*  --   --   --  1.3*  HEPARINUNFRC  --   --    < > 0.21*   < > 0.56 0.38 0.52  CREATININE 10.23*  --   --   --   --   --   --   --    < > = values in this interval not displayed.    Estimated Creatinine Clearance: 12.3 mL/min (A) (by C-G formula based on SCr of 10.23 mg/dL (H)).   Medical History: Past Medical History:  Diagnosis Date  . Arthritis   . Closed dislocation of left hip ([place]) 05/21/2019  . Diabetes ([place])   . Diabetes mellitus without complication ([place])   . History of peritoneal dialysis   . Hypertension   . Renal disorder    FFGS  . Renal disorder   . Stroke [facility])    when ha was a child  . Vasovagal syndrome    with syncope    Medications:  Medications Prior to Admission  Medication Sig Dispense Refill Last Dose  . acetaminophen (TYLENOL) 325 MG tablet Take 2 tablets (650 mg total) by mouth every 8 (eight) hours.   07/12/2019 at Unknown time  . amLODipine (NORVASC) 5 MG tablet Take 1 tablet (5 mg total) by mouth daily. 30 tablet 0 07/12/2019 at Unknown time  . aspirin EC 81 MG EC tablet Take 1 tablet (81 mg total) by  mouth daily.   07/12/2019 at Unknown time  . atorvastatin (LIPITOR) 40 MG tablet Take 1 tablet (40 mg total) by mouth daily at 6 PM. 31 tablet 6 07/12/2019 at Unknown time  . buPROPion (WELLBUTRIN SR) 150 MG 12 hr tablet Take 1 tablet (150 mg total) by mouth 2 (two) times daily with a meal. 60 tablet 0 07/12/2019 at Unknown time  . cholecalciferol (VITAMIN D) 25 MCG tablet Take 2 tablets (2,000 Units total) by mouth 2 (two) times daily. 60 tablet 0 07/12/2019 at Unknown time  . docusate sodium (COLACE) 100 MG capsule Take 300 mg by mouth daily as needed for mild constipation or moderate constipation. As directed    Past Month at Unknown time  . HYDROcodone-acetaminophen (NORCO/VICODIN) 5-325 MG tablet Take 1 tablet by mouth every 8 (eight) hours as needed.   Past Week  at Unknown time  . loratadine (CLARITIN) 10 MG tablet Take 1 tablet (10 mg total) by mouth daily. 30 tablet 0 07/12/2019 at Unknown time  . methocarbamol (ROBAXIN) 500 MG tablet Take 2 tablets (1,000 mg total) by mouth 3 (three) times daily. 90 tablet 0 07/11/2019 at Unknown time  . metoprolol tartrate (LOPRESSOR) 25 MG tablet Take 0.5 tablets (12.5 mg total) by mouth 2 (two) times daily. 30 tablet 0 07/12/2019 at 2000  . omeprazole (PRILOSEC) 20 MG capsule Take 1 capsule (20 mg total) by mouth daily. 30 capsule 0 07/12/2019 at Unknown time  . pioglitazone (ACTOS) 15 MG tablet Take 1 tablet (15 mg total) by mouth daily. 30 tablet 0 07/12/2019 at Unknown time  . pregabalin (LYRICA) 50 MG capsule Take 1 capsule (50 mg total) by mouth at bedtime. 30 capsule 0 07/12/2019 at Unknown time  . sevelamer carbonate (RENVELA) 800 MG tablet Take 3 tablets (2,400 mg total) by mouth 3 (three) times daily with meals. 240 tablet 1 07/12/2019 at Unknown time  . warfarin (COUMADIN) 2 MG tablet Take 1 tablet (2 mg total) by mouth daily. Or as directed by [facility]mad[facility] Clinic (Patient taking differently: Take 2 mg by mouth every evening. ) 30 tablet 4 07/12/2019 at 2000  .  oxyCODONE (OXYCONTIN) 10 mg 12 hr tablet Take 1 tablet (10 mg total) by mouth daily. (Patient not taking: Reported on 07/13/2019) 14 tablet 0 Not Taking at Unknown time    Assessment: Patient is post op day 1 with wound vac and peritoneal dialysis.  On warfarin for DVT which was stopped for surgery.  Received phytonadione 5 mg IV 07/14/19 0037.  Heparin infusion per pharmacy consult ordered today with plans for Orthopedic Surgery to dictate when to start warfarin.  Patient was previously on a heparin infusion last week and a heparin level of 0.26 was achieved with heparin infusing at 1800 units/hr. D/W MD, lower heparin goal is not needed, therefore will increase goal to 0.3-0.7 once restarted.  6/21 AM update:  Heparin level thereapeutic 0.52  Goal of Therapy:  Heparin Level goal = 0.3-0.7 Monitor platelets by anticoagulation protocol: Yes   Plan:  Heparin on hold for surgery per PA When restarted, restart at 1900 units/hr Heparin level daily  Reyonna Haack A. Levada Dy, PharmD, BCPS, FNKF Clinical Pharmacist [place] Please utilize Amion for appropriate phone number to reach the unit pharmacist ([place])

## 2019-07-18 ENCOUNTER — Encounter (HOSPITAL_COMMUNITY): Payer: Self-pay | Admitting: Orthopedic Surgery

## 2019-07-18 LAB — GLUCOSE, CAPILLARY
Glucose-Capillary: 119 mg/dL — ABNORMAL HIGH (ref 70–99)
Glucose-Capillary: 140 mg/dL — ABNORMAL HIGH (ref 70–99)
Glucose-Capillary: 144 mg/dL — ABNORMAL HIGH (ref 70–99)
Glucose-Capillary: 184 mg/dL — ABNORMAL HIGH (ref 70–99)
Glucose-Capillary: 190 mg/dL — ABNORMAL HIGH (ref 70–99)
Glucose-Capillary: 224 mg/dL — ABNORMAL HIGH (ref 70–99)

## 2019-07-18 LAB — RENAL FUNCTION PANEL
Albumin: 1.5 g/dL — ABNORMAL LOW (ref 3.5–5.0)
Anion gap: 16 — ABNORMAL HIGH (ref 5–15)
BUN: 44 mg/dL — ABNORMAL HIGH (ref 6–20)
CO2: 23 mmol/L (ref 22–32)
Calcium: 8.8 mg/dL — ABNORMAL LOW (ref 8.9–10.3)
Chloride: 96 mmol/L — ABNORMAL LOW (ref 98–111)
Creatinine, Ser: 8.69 mg/dL — ABNORMAL HIGH (ref 0.61–1.24)
GFR calc Af Amer: 8 mL/min — ABNORMAL LOW (ref >60)
GFR calc non Af Amer: 7 mL/min — ABNORMAL LOW (ref >60)
Glucose, Bld: 147 mg/dL — ABNORMAL HIGH (ref 70–99)
Phosphorus: 5.3 mg/dL — ABNORMAL HIGH (ref 2.5–4.6)
Potassium: 3.1 mmol/L — ABNORMAL LOW (ref 3.5–5.1)
Sodium: 135 mmol/L (ref 135–145)

## 2019-07-18 LAB — CBC
HCT: 24.8 % — ABNORMAL LOW (ref 39.0–52.0)
Hemoglobin: 7.6 g/dL — ABNORMAL LOW (ref 13.0–17.0)
MCH: 27.5 pg (ref 26.0–34.0)
MCHC: 30.6 g/dL (ref 30.0–36.0)
MCV: 89.9 fL (ref 80.0–100.0)
Platelets: 235 10*3/uL (ref 150–400)
RBC: 2.76 MIL/uL — ABNORMAL LOW (ref 4.22–5.81)
RDW: 16.6 % — ABNORMAL HIGH (ref 11.5–15.5)
WBC: 21.9 10*3/uL — ABNORMAL HIGH (ref 4.0–10.5)
nRBC: 0 % (ref 0.0–0.2)

## 2019-07-18 LAB — SURGICAL PATHOLOGY

## 2019-07-18 LAB — HEPARIN LEVEL (UNFRACTIONATED)
Heparin Unfractionated: 0.51 IU/mL (ref 0.30–0.70)
Heparin Unfractionated: 0.6 IU/mL (ref 0.30–0.70)

## 2019-07-18 LAB — PROTIME-INR
INR: 1.4 — ABNORMAL HIGH (ref 0.8–1.2)
Prothrombin Time: 16.7 seconds — ABNORMAL HIGH (ref 11.4–15.2)

## 2019-07-18 MED ORDER — SODIUM CHLORIDE 0.9 % IV SOLN: INTRAVENOUS | Status: DC

## 2019-07-18 NOTE — Consult Note (Signed)
Reason for Consult:Right index finger ulceration Referring Physician: Axyl Pittman. is an 44 y.o. male.  HPI: Max Pittman was admitted recently for a left hip surgical site infection. On admission it was noted he had an ulceration at the tip of his right index finger. At the time he noted that it had been there since about 4/15 and was not associated with any pain or discharge. It has failed to heal. 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 He is RHD.  Past Medical History:  Diagnosis Date  . Arthritis   . Closed dislocation of left hip ([place]) 05/21/2019  . Diabetes ([place])   . Diabetes mellitus without complication ([place])   . History of peritoneal dialysis   . Hypertension   . Renal disorder    FFGS  . Renal disorder   . Stroke [facility])    when ha was a child  . Vasovagal syndrome    with syncope    Past Surgical History:  Procedure Laterality Date  . APPLICATION OF WOUND VAC Left 05/22/2019   Procedure: APPLICATION OF WOUND VAC  LEFT HIP;  Surgeon: Altamese [place], MD;  Location: [place];  Service: Orthopedics;  Laterality: Left;  . APPLICATION OF WOUND VAC Left 07/17/2019   Procedure: WOUND VAC CHANGE;  Surgeon: Altamese [place], MD;  Location: [place];  Service: Orthopedics;  Laterality: Left;  . AV FISTULA INSERTION W/ RF MAGNETIC GUIDANCE Left   . AV FISTULA PLACEMENT    . BUBBLE STUDY  05/25/2019   Procedure: BUBBLE STUDY;  Surgeon: Dorothy Spark, MD;  Location: [place];  Service: Cardiovascular;;  . CHOLECYSTECTOMY    . HERNIA REPAIR    . HIP CLOSED REDUCTION Left 05/20/2019   Procedure: CLOSED REDUCTION HIP WITH TRACTION PIN APPLICATION;  Surgeon: Altamese [place], MD;  Location: [place];  Service: Orthopedics;  Laterality: Left;  . INCISION AND DRAINAGE HIP Left 07/14/2019   Procedure: IRRIGATION AND DEBRIDEMENT HIP;  Surgeon: Altamese Burr, MD;  Location: [place];  Service: Orthopedics;  Laterality: Left;  . INCISION AND DRAINAGE HIP Left  07/17/2019   Procedure: REPEAT IRRIGATION AND DEBRIDEMENT LEFT HIP;  Surgeon: Altamese Friend, MD;  Location: [place];  Service: Orthopedics;  Laterality: Left;  . IR FLUORO GUIDE CV LINE RIGHT  05/23/2019  . IR US GUIDE VASC ACCESS RIGHT  05/23/2019  . LEFT HEART CATH AND CORONARY ANGIOGRAPHY N/A 03/22/2019   Procedure: LEFT HEART CATH AND CORONARY ANGIOGRAPHY;  Surgeon: Wellington [place], MD;  Location: [place] [facility];  Service: Cardiovascular;  Laterality: N/A;  . LOOP RECORDER INSERTION N/A 05/25/2019   Procedure: LOOP RECORDER INSERTION;  Surgeon: [name], MD;  Location: [place] [facility];  Service: Cardiovascular;  Laterality: N/A;  . ORIF ACETABULAR FRACTURE Left 05/22/2019   Procedure: OPEN REDUCTION INTERNAL FIXATION (ORIF) T TYPE  WITH ASSOCIATED POSTERIOR WALL ACETABULAR FRACTURE, LEFT; REMOVAL OF TRACTION PIN LEFT TIBIA;  Surgeon: Altamese [place]lake, MD;  Location: [place];  Service: Orthopedics;  Laterality: Left;  . REMOVAL OF A DIALYSIS CATHETER Right 07/17/2019   Procedure: REMOVAL OF HEMODIALYSIS DIALYSIS CATHETER;  Surgeon: Altamese Egypt, MD;  Location: [place];  Service: Orthopedics;  Laterality: Right;  . TEE WITHOUT CARDIOVERSION N/A 05/25/2019   Procedure: TRANSESOPHAGEAL ECHOCARDIOGRAM (TEE);  Surgeon: Dorothy Spark, MD;  Location: [facility] ENDOSCOPY;  Service: Cardiovascular;  Laterality: N/A;  . TONSILLECTOMY      Family History  Problem Relation Age of Onset  .  Heart disease Mother   . Diabetes Mother   . Hypertension Mother   . Diabetes Father   . Hypertension Father   . CAD Mother        "angina"  . Hypercholesterolemia Mother   . Hypercholesterolemia Father   . Healthy Brother     Social History:  reports that he quit smoking about 8 months ago. He has never used smokeless tobacco. He reports that he does not drink alcohol and does not use drugs.  Allergies:  Allergies  Allergen Reactions  . Doxycycline Hives  . Latex Swelling    Pt reports swelling at  site.   . Morphine And Related Anxiety    Medications: I have reviewed the patient's current medications.  Results for orders placed or performed during the hospital encounter of 07/13/19 (from the past 48 hour(s))  Heparin level (unfractionated)     Status: None   Collection Time: 07/16/19  2:23 PM  Result Value Ref Range   Heparin Unfractionated 0.56 0.30 - 0.70 IU/mL    Comment: (NOTE) If heparin results are below expected values, and patient dosage has  been confirmed, suggest follow up testing of antithrombin III levels. Performed at [facility], [place] [address], [place], [place] [address]   Glucose, capillary     Status: Abnormal   Collection Time: 07/16/19  4:31 PM  Result Value Ref Range   Glucose-Capillary 102 (H) 70 - 99 mg/dL    Comment: Glucose reference range applies only to samples taken after fasting for at least 8 hours.  Heparin level (unfractionated)     Status: None   Collection Time: 07/16/19 10:42 PM  Result Value Ref Range   Heparin Unfractionated 0.38 0.30 - 0.70 IU/mL    Comment: (NOTE) If heparin results are below expected values, and patient dosage has  been confirmed, suggest follow up testing of antithrombin III levels. Performed at [facility], [place] [address], [place], [place] [address]   Glucose, capillary     Status: Abnormal   Collection Time: 07/17/19 12:54 AM  Result Value Ref Range   Glucose-Capillary 127 (H) 70 - 99 mg/dL    Comment: Glucose reference range applies only to samples taken after fasting for at least 8 hours.  Protime-INR     Status: Abnormal   Collection Time: 07/17/19  7:18 AM  Result Value Ref Range   Prothrombin Time 16.0 (H) 11.4 - 15.2 seconds   INR 1.3 (H) 0.8 - 1.2    Comment: (NOTE) INR goal varies based on device and disease states. Performed at [facility], [place] [address] [place], [place] [address]   CBC     Status: Abnormal   Collection Time: 07/17/19  7:18 AM  Result Value Ref Range    WBC 16.8 (H) 4.0 - 10.5 K/uL   RBC 2.89 (L) 4.22 - 5.81 MIL/uL   Hemoglobin 8.0 (L) 13.0 - 17.0 g/dL   HCT 26.0 (L) 39 - 52 %   MCV 90.0 80.0 - 100.0 fL   MCH 27.7 26.0 - 34.0 pg   MCHC 30.8 30.0 - 36.0 g/dL   RDW 16.4 (H) 11.5 - 15.5 %   Platelets 256 150 - 400 K/uL   nRBC 0.2 0.0 - 0.2 %    Comment: Performed at [facility], [place] [address] [place], [place] [address]  Heparin level (unfractionated)     Status: None   Collection Time: 07/17/19  7:18 AM  Result  Value Ref Range   Heparin Unfractionated 0.52 0.30 - 0.70 IU/mL    Comment: (NOTE) If heparin results are below expected values, and patient dosage has  been confirmed, suggest follow up testing of antithrombin III levels. Performed at [facility], [place] [address], [place], [place] [address]   Glucose, capillary     Status: Abnormal   Collection Time: 07/17/19  7:21 AM  Result Value Ref Range   Glucose-Capillary 144 (H) 70 - 99 mg/dL    Comment: Glucose reference range applies only to samples taken after fasting for at least 8 hours.  Surgical pcr screen     Status: Abnormal   Collection Time: 07/17/19  7:49 AM   Specimen: Nasal Mucosa; Nasal Swab  Result Value Ref Range   MRSA, PCR NEGATIVE NEGATIVE   Staphylococcus aureus POSITIVE (A) NEGATIVE    Comment: (NOTE) The Xpert SA Assay (FDA approved for NASAL specimens in patients 19 years of age and older), is one component of a comprehensive surveillance program. It is not intended to diagnose infection nor to guide or monitor treatment. Performed at [facility], [place] [address], [place], [place] [address]   Glucose, capillary     Status: Abnormal   Collection Time: 07/17/19 11:32 AM  Result Value Ref Range   Glucose-Capillary 148 (H) 70 - 99 mg/dL    Comment: Glucose reference range applies only to samples taken after fasting for at least 8 hours.  Glucose, capillary     Status: Abnormal   Collection Time: 07/17/19  2:42 PM  Result Value Ref  Range   Glucose-Capillary 132 (H) 70 - 99 mg/dL    Comment: Glucose reference range applies only to samples taken after fasting for at least 8 hours.  Glucose, capillary     Status: Abnormal   Collection Time: 07/17/19  4:10 PM  Result Value Ref Range   Glucose-Capillary 133 (H) 70 - 99 mg/dL    Comment: Glucose reference range applies only to samples taken after fasting for at least 8 hours.  Glucose, capillary     Status: Abnormal   Collection Time: 07/17/19  5:53 PM  Result Value Ref Range   Glucose-Capillary 160 (H) 70 - 99 mg/dL    Comment: Glucose reference range applies only to samples taken after fasting for at least 8 hours.  Glucose, capillary     Status: Abnormal   Collection Time: 07/17/19  8:46 PM  Result Value Ref Range   Glucose-Capillary 276 (H) 70 - 99 mg/dL    Comment: Glucose reference range applies only to samples taken after fasting for at least 8 hours.  Heparin level (unfractionated)     Status: Abnormal   Collection Time: 07/17/19 10:16 PM  Result Value Ref Range   Heparin Unfractionated 0.26 (L) 0.30 - 0.70 IU/mL    Comment: (NOTE) If heparin results are below expected values, and patient dosage has  been confirmed, suggest follow up testing of antithrombin III levels. Performed at [facility], [place] [address] [place], [place] [address]   Glucose, capillary     Status: Abnormal   Collection Time: 07/18/19 12:02 AM  Result Value Ref Range   Glucose-Capillary 224 (H) 70 - 99 mg/dL    Comment: Glucose reference range applies only to samples taken after fasting for at least 8 hours.  Protime-INR     Status: Abnormal   Collection Time: 07/18/19  4:08 AM  Result Value Ref Range   Prothrombin Time  16.7 (H) 11.4 - 15.2 seconds   INR 1.4 (H) 0.8 - 1.2    Comment: (NOTE) INR goal varies based on device and disease states. Performed at [facility], [place] [address] [place], [place] [address]   CBC     Status: Abnormal   Collection Time: 07/18/19   4:08 AM  Result Value Ref Range   WBC 21.9 (H) 4.0 - 10.5 K/uL   RBC 2.76 (L) 4.22 - 5.81 MIL/uL   Hemoglobin 7.6 (L) 13.0 - 17.0 g/dL   HCT 24.8 (L) 39 - 52 %   MCV 89.9 80.0 - 100.0 fL   MCH 27.5 26.0 - 34.0 pg   MCHC 30.6 30.0 - 36.0 g/dL   RDW 16.6 (H) 11.5 - 15.5 %   Platelets 235 150 - 400 K/uL   nRBC 0.0 0.0 - 0.2 %    Comment: Performed at [facility], [place] [address], [place], [place] [address]  Heparin level (unfractionated)     Status: None   Collection Time: 07/18/19  4:08 AM  Result Value Ref Range   Heparin Unfractionated 0.51 0.30 - 0.70 IU/mL    Comment: (NOTE) If heparin results are below expected values, and patient dosage has  been confirmed, suggest follow up testing of antithrombin III levels. Performed at [facility], [place] [address], [place], [place] [address]   Glucose, capillary     Status: Abnormal   Collection Time: 07/18/19  6:39 AM  Result Value Ref Range   Glucose-Capillary 184 (H) 70 - 99 mg/dL    Comment: Glucose reference range applies only to samples taken after fasting for at least 8 hours.  Glucose, capillary     Status: Abnormal   Collection Time: 07/18/19  7:23 AM  Result Value Ref Range   Glucose-Capillary 190 (H) 70 - 99 mg/dL    Comment: Glucose reference range applies only to samples taken after fasting for at least 8 hours.  Heparin level (unfractionated)     Status: None   Collection Time: 07/18/19 11:00 AM  Result Value Ref Range   Heparin Unfractionated 0.60 0.30 - 0.70 IU/mL    Comment: (NOTE) If heparin results are below expected values, and patient dosage has  been confirmed, suggest follow up testing of antithrombin III levels. Performed at [facility], [place] [address], [place], [place] [address]   Glucose, capillary     Status: Abnormal   Collection Time: 07/18/19 11:12 AM  Result Value Ref Range   Glucose-Capillary 140 (H) 70 - 99 mg/dL    Comment: Glucose reference range applies only to samples taken  after fasting for at least 8 hours.    No results found.  Review of Systems  Constitutional: Negative for chills, diaphoresis and fever.  HENT: Negative for ear discharge, ear pain, hearing loss and tinnitus.   Eyes: Negative for photophobia and pain.  Respiratory: Negative for cough and shortness of breath.   Cardiovascular: Negative for chest pain.  Gastrointestinal: Negative for abdominal pain, nausea and vomiting.  Genitourinary: Negative for dysuria, flank pain, frequency and urgency.  Musculoskeletal: Positive for arthralgias (Left hip). Negative for back pain, myalgias and neck pain.  Neurological: Negative for dizziness and headaches.  Hematological: Does not bruise/bleed easily.  Psychiatric/Behavioral: The patient is not nervous/anxious.    Blood pressure 120/67, pulse 79, temperature 97.7 F (36.5 C), temperature source Oral, resp. rate 18, height 5\' 9"  (1.753 m), weight 123.3 kg, SpO2 93 %. Physical  Exam  Constitutional: He appears well-developed. No distress.  HENT:  Head: Normocephalic and atraumatic.  Eyes: Conjunctivae are normal. Right eye exhibits no discharge. Left eye exhibits no discharge. No scleral icterus.  Cardiovascular: Normal rate and regular rhythm.  Respiratory: Effort normal. No respiratory distress.  Musculoskeletal:     Cervical back: Normal range of motion.     Comments: Right shoulder, elbow, wrist, digits- Dry gangrene tip of index finger, mild TTP, no instability, no blocks to motion  Sens  Ax/R/M/U intact  Mot   Ax/ R/ PIN/ M/ AIN/ U intact  Rad 2+  Neurological: He is alert.  Skin: Skin is warm and dry. He is not diaphoretic.  Psychiatric: His behavior is normal.    Assessment/Plan: Right index finger ulceration w/osteo -- Suspect he will amputation of the distal phalanx. Dr. Amedeo Plenty to evaluate later today or in AM.  Multiple medical problems including ESRD on peritoneal dialysis, diabetes mellitus, hypertension, and DVT on coumadin --  per primary service    Lisette Abu, PA-C Orthopedic Surgery [phone] 07/18/2019, 12:37 PM

## 2019-07-18 NOTE — Anesthesia Preprocedure Evaluation (Addendum)
Anesthesia Evaluation  Patient identified by MRN, date of birth, ID band Patient awake    Reviewed: Allergy & Precautions, NPO status , Patient's Chart, lab work & pertinent test results  History of Anesthesia Complications Negative for: history of anesthetic complications  Airway Mallampati: III  TM Distance: >3 FB Neck ROM: Full    Dental  (+) Poor Dentition, Dental Advisory Given   Pulmonary former smoker,    Pulmonary exam normal        Cardiovascular hypertension, Pt. on medications and Pt. on home beta blockers + CAD and + Past MI  Normal cardiovascular exam  ECG: ST, rate 105  ECHO: 1. Left ventricular ejection fraction, by estimation, is 60 to 65%. The left ventricle has normal function. Left ventricular endocardial border not optimally defined to evaluate regional wall motion. Left ventricular diastolic function could not be evaluated. 2. Right ventricular systolic function is normal. The right ventricular size is normal. Tricuspid regurgitation signal is inadequate for assessing PA pressure. 3. The mitral valve is normal in structure. Trivial mitral valve regurgitation. No evidence of mitral stenosis. 4. The aortic valve has been repaired/replaced. Aortic valve regurgitation is not visualized. No aortic stenosis is present. 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 Echo findings are consistent with normal structure and function of the aortic valve prosthesis. No evidence of perivavluar AI. 5. The inferior vena cava is normal in size with greater than 50% respiratory variability, suggesting right atrial pressure of 3 mmHg.   Neuro/Psych CVA negative psych ROS   GI/Hepatic Neg liver ROS, GERD  Medicated and Controlled,  Endo/Other  diabetes, Oral Hypoglycemic AgentsMorbid obesity  Renal/GU ESRF and DialysisRenal diseaseHistory of peritoneal dialysis      Musculoskeletal negative musculoskeletal ROS (+)   Abdominal (+) + obese,   Peds  Hematology  (+) anemia , HLD   Anesthesia Other Findings Left Hip Abscess  Reproductive/Obstetrics                            Anesthesia Physical  Anesthesia Plan  ASA: III  Anesthesia Plan: MAC   Post-op Pain Management:    Induction: Intravenous  PONV Risk Score and Plan: 2 and Ondansetron and Propofol infusion  Airway Management Planned: Natural Airway  Additional Equipment:   Intra-op Plan:   Post-operative Plan:   Informed Consent: I have reviewed the patients History and Physical, chart, labs and discussed the procedure including the risks, benefits and alternatives for the proposed anesthesia with the patient or authorized representative who has indicated his/her understanding and acceptance.     Dental advisory given  Plan Discussed with: CRNA and Anesthesiologist  Anesthesia Plan Comments:        Anesthesia Quick Evaluation

## 2019-07-18 NOTE — Progress Notes (Signed)
Patient just used bedside commode to move his bowels. 2 NT's was assisting patient back in bed using a walker when suddenly went down on the floor. "My legs gave in." Patient denies any pain. Patient was able to stand with 3 assists back to bed. Vitals are within normal limits. Alert and oriented x 4. Will continue to monitor. Patient will call significant other about the fall. MD on call paged and made aware.
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
## 2019-07-18 NOTE — Progress Notes (Signed)
    [facility] has been requested to perform a transesophageal echocardiogram on Max Pittman. for bacteremia.  After careful review of history and examination, the risks and benefits of transesophageal echocardiogram have been explained including risks of esophageal damage, perforation (1:10,000 risk), bleeding, pharyngeal hematoma as well as other potential complications associated with conscious sedation including aspiration, arrhythmia, respiratory failure and death. Alternatives to treatment were discussed, questions were answered. Patient is willing to proceed.  His last one was in April of this year and he was intubated at that time.   Cecilie Kicks, NP  07/18/2019 4:33 PM

## 2019-07-18 NOTE — Consult Note (Signed)
  I have reviewed the chart at great length and the patient's multiple problems.  Currently the patient has osteomyelitis about his right index finger distal phalanx.  He has a sed rate of 123 and a C-reactive protein of 35.2.  He does have a draining ulcer at the tip of his finger with nonviable skin in approximately a area of the size of a dime.  He is on peritoneal dialysis.  He has a host of medical issues.  Dr. Marcelino Scot has me to look at the finger and recommend an instituted care plan form.  I read the notes from Janene Madeira and infectious disease colleagues.  At present juncture I do feel he has definitive osteomyelitis based upon his MRI and plain film radiograph.  With the erosions and the ulcer at the tip of his finger I do not feel that any type of salvage would be in his best interest.  I would recommend a DIP level amputation at [facility] convenient time in the future.  He understands this and agrees.  I did discuss other options of serial debridements or suppressive treatment but I really do not feel that this is in his best interest.  He concurs.  Given these issues and the challenges facing him I would recommend a DIP level fusion at a mutually convenient time within the next 2 weeks.  We will coordinate accordingly based upon his progress in the hospital with the other infectious ailments he is battling etc.  It was a pleasure to see him today.  We appreciate the consult.  Kaelah Hayashi MD

## 2019-07-18 NOTE — Progress Notes (Signed)
  [facility] Progress Note   Assessment/ Plan:   1. MSSA bacteremia: in setting of L hip hardware infection and R index finger osteo.  On Cefazolin.  TEE tomorrow 6/23.  Repeat I and D 6/22  ID on board 2.ESRD continue PD--> ? If he's a long term candidate given volume issues and low albumin.  Will do all 2.5% tonight.   3. Anemia: continue ESA 4. CKD-MBD: binders and renal vitamins 5. Nutrition: needs more protein 6. Hypertension: reasonably well controlled 7.  R finger osteo- may need distal phalanx amp, per ortho 8.  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  Dispo: pending  Subjective:    For TEE tomorrow, hand surgery to evaluate R finger osteo.     Objective:   BP 120/67 (BP Location: Right Arm)   Pulse 79   Temp 97.7 F (36.5 C) (Oral)   Resp 18   Ht 5\' 9"  (1.753 m)   Wt 123.3 kg   SpO2 93%   BMI 40.14 kg/m   Physical Exam: Gen: pale, NAD CVS: RRR Resp: clear Abd:PD cath in place Ext: no LE edema ACCESS: R IJ TDC removed  Labs: BMET Recent Labs  Lab 07/13/19 1405 07/14/19 2048  NA 127* 133*  K 3.7 4.0  CL 87* 95*  CO2 25 22  GLUCOSE 443* 103*  BUN 32* 46*  CREATININE 8.23* 10.23*  CALCIUM 8.8* 8.6*   CBC Recent Labs  Lab 07/13/19 1405 07/16/19 0417 07/17/19 0718 07/18/19 0408  WBC 13.2* 13.6* 16.8* 21.9*  NEUTROABS 10.6*  --   --   --   HGB 10.0* 8.0* 8.0* 7.6*  HCT 33.3* 25.8* 26.0* 24.8*  MCV 92.5 90.2 90.0 89.9  PLT 296 264 256 235      Medications:    . atorvastatin  40 mg Oral q1800  . buPROPion  150 mg Oral BID WC  . Chlorhexidine Gluconate Cloth  6 each Topical Daily  . darbepoetin (ARANESP) injection - NON-DIALYSIS  100 mcg Subcutaneous Q Sun-1800  . feeding supplement (PRO-STAT SUGAR FREE 64)  30 mL Oral BID  . gentamicin cream  1 application Topical Daily  . insulin aspart  0-9 Units Subcutaneous Q6H  . mupirocin ointment   Nasal BID  . pregabalin  50 mg Oral QHS  . sevelamer carbonate  2,400 mg Oral TID WC      Madelon Lips MD 07/18/2019, 1:02 PM

## 2019-07-18 NOTE — Progress Notes (Signed)
[facility] for Infectious Disease  Date of Admission:  07/13/2019      Total days of antibiotics 6  Day 5 cefazolin           ASSESSMENT: Max Pittman. is a 44 y.o. male with MSSA bacteremia complicated by infected left hip hardware with abscess and chronic indwelling HD line. He seems to be clearing his blood cultures --> continue cefazolin IV.  Repeat debridement with placement of antibiotic beads yesterday. Wound closed with sutures and Prevena vac in place.   HD line removed - hopefully can avoid replacing this. Not sure he is a good long term candidate per nephrology d/t hypoalbuminemia and volume issues.  If he continues with home PD will need to arranged tunneled PICC for antibiotics before discharge.   Scheduled forTEE 6/23 with h/o TAVR valve to ensure no prosthetic valve endocarditis is present.   Dr. Amedeo Plenty evaluation is pending but suspect he needs partial finger amputation for treatment.       PLAN: 1. Continue Cefazolin  2. Follow for TEE 6/23 3. Follow Dr. Vanetta Shawl recommendations re: right pointer finger osteo 4. Follow for ongoing surgical interventions.    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 atorvastatin  40 mg Oral q1800  . buPROPion  150 mg Oral BID WC  . Chlorhexidine Gluconate Cloth  6 each Topical Daily  . darbepoetin (ARANESP) injection - NON-DIALYSIS  100 mcg Subcutaneous Q Sun-1800  . feeding supplement (PRO-STAT SUGAR FREE 64)  30 mL Oral BID  . gentamicin cream  1 application Topical Daily  . insulin aspart  0-9 Units Subcutaneous Q6H  . mupirocin ointment   Nasal  BID  . pregabalin  50 mg Oral QHS  . sevelamer carbonate  2,400 mg Oral TID WC    SUBJECTIVE: Did well with surgery. No concerns today. Waiting for orthopedics to discuss his right finger treatment.    Review of Systems: Review of Systems  Constitutional: Negative for chills, fever and malaise/fatigue.  Respiratory: Negative for cough and shortness of breath.   Cardiovascular: Positive for leg swelling (L hip). Negative for chest pain.  Gastrointestinal: Negative for abdominal pain, diarrhea, nausea and vomiting.  Musculoskeletal: Positive for joint pain.       L hip pain overall improved.  No further knee pain.   Neurological: Negative for dizziness and weakness.    Allergies  Allergen Reactions  . Doxycycline Hives  . Latex Swelling    Pt reports swelling at site.   . Morphine And Related Anxiety    OBJECTIVE: Vitals:   07/17/19 2355 07/18/19 0416 07/18/19 0904 07/18/19 1614  BP: 129/78 116/71 120/67 132/76  Pulse: (!) 102 83 79 83  Resp: 17 16 18 18   Temp: 97.7 F (36.5 C) 97.6 F (36.4 C) 97.7 F (36.5 C) 97.8 F (36.6 C)  TempSrc: Oral Oral Oral Oral  SpO2: 100% 92% 93% 95%  Weight:      Height:       Body mass index is 40.14 kg/m.  Physical Exam Constitutional:      Appearance:  Normal appearance.     Comments: Resting comfortably in bed.   HENT:     Mouth/Throat:     Mouth: Mucous membranes are moist.  Eyes:     General: No scleral icterus. Cardiovascular:     Rate and Rhythm: Normal rate and regular rhythm.     Heart sounds: No murmur heard.   Pulmonary:     Effort: Pulmonary effort is normal.     Breath sounds: Normal breath sounds.  Chest:     Comments: HD line removed - clean dry dressing Abdominal:     General: Bowel sounds are normal. There is distension (PD ongoing).  Musculoskeletal:     Comments: L hip with clean sponge in place. Serous-red tinged drainage in cannister. No peri wound erythema. L knee is normal appearing, no redness  or TTP.   R distal pointer finger remains swollen and red with chronic ulceration. No drainage.   Skin:    General: Skin is warm and dry.     Capillary Refill: Capillary refill takes less than 2 seconds.  Neurological:     Mental Status: He is alert and oriented to person, place, and time.     Lab Results Lab Results  Component Value Date   WBC 21.9 (H) 07/18/2019   HGB 7.6 (L) 07/18/2019   HCT 24.8 (L) 07/18/2019   MCV 89.9 07/18/2019   PLT 235 07/18/2019    Lab Results  Component Value Date   CREATININE 8.69 (H) 07/18/2019   BUN 44 (H) 07/18/2019   NA 135 07/18/2019   K 3.1 (L) 07/18/2019   CL 96 (L) 07/18/2019   CO2 23 07/18/2019    Lab Results  Component Value Date   ALT 11 07/13/2019   AST 14 (L) 07/13/2019   ALKPHOS 122 07/13/2019   BILITOT 0.6 07/13/2019     Microbiology: Recent Results (from the past 240 hour(s))  Blood culture (routine x 2)     Status: Abnormal   Collection Time: 07/13/19  2:06 PM   Specimen: BLOOD RIGHT ARM  Result Value Ref Range Status   Specimen Description   Final    BLOOD RIGHT ARM Performed at [facility], [address], [place], [place] [address]    Special Requests   Final    BOTTLES DRAWN AEROBIC AND ANAEROBIC Blood Culture adequate volume Performed at [facility], [address] [place], [place] [address]    Culture  Setup Time   Final    IN BOTH AEROBIC AND ANAEROBIC BOTTLES GRAM POSITIVE COCCI Gram Stain Report Called to,Read Back By and Verified With: C MARSHALL,RN @0311  07/14/19 MKELLY CRITICAL RESULT CALLED TO, READ BACK BY AND VERIFIED WITH: Roxy Manns  [id number] FCP Performed at [facility], [place] [address] [place], [place] [address]    Culture STAPHYLOCOCCUS AUREUS (A)  Final   Report Status 07/16/2019 FINAL  Final   Organism ID, Bacteria STAPHYLOCOCCUS AUREUS  Final      Susceptibility   Staphylococcus aureus - MIC*    CIPROFLOXACIN <=0.5 SENSITIVE Sensitive     ERYTHROMYCIN <=0.25 SENSITIVE  Sensitive     GENTAMICIN <=0.5 SENSITIVE Sensitive     OXACILLIN 0.5 SENSITIVE Sensitive     TETRACYCLINE <=1 SENSITIVE Sensitive     VANCOMYCIN <=0.5 SENSITIVE Sensitive     TRIMETH/SULFA <=10 SENSITIVE Sensitive     CLINDAMYCIN <=0.25 SENSITIVE Sensitive     RIFAMPIN <=0.5 SENSITIVE Sensitive     Inducible Clindamycin NEGATIVE Sensitive     *  STAPHYLOCOCCUS AUREUS  Blood Culture ID Panel (Reflexed)     Status: Abnormal   Collection Time: 07/13/19  2:06 PM  Result Value Ref Range Status   Enterococcus species NOT DETECTED NOT DETECTED Final   Listeria monocytogenes NOT DETECTED NOT DETECTED Final   Staphylococcus species DETECTED (A) NOT DETECTED Final    Comment: CRITICAL RESULT CALLED TO, READ BACK BY AND VERIFIED WITH: Roxy Manns  [id number] FCP    Staphylococcus aureus (BCID) DETECTED (A) NOT DETECTED Final    Comment: Methicillin (oxacillin) susceptible Staphylococcus aureus (MSSA). Preferred therapy is anti staphylococcal beta lactam antibiotic (Cefazolin or Nafcillin), unless clinically contraindicated. CRITICAL RESULT CALLED TO, READ BACK BY AND VERIFIED WITH: Roxy Manns  [id number] FCP    Methicillin resistance NOT DETECTED NOT DETECTED Final   Streptococcus species NOT DETECTED NOT DETECTED Final   Streptococcus agalactiae NOT DETECTED NOT DETECTED Final   Streptococcus pneumoniae NOT DETECTED NOT DETECTED Final   Streptococcus pyogenes NOT DETECTED NOT DETECTED Final   Acinetobacter baumannii NOT DETECTED NOT DETECTED Final   Enterobacteriaceae species NOT DETECTED NOT DETECTED Final   Enterobacter cloacae complex NOT DETECTED NOT DETECTED Final   Escherichia coli NOT DETECTED NOT DETECTED Final   Klebsiella oxytoca NOT DETECTED NOT DETECTED Final   Klebsiella pneumoniae NOT DETECTED NOT DETECTED Final   Proteus species NOT DETECTED NOT DETECTED Final   Serratia marcescens NOT DETECTED NOT DETECTED Final   Haemophilus influenzae NOT DETECTED NOT DETECTED  Final   Neisseria meningitidis NOT DETECTED NOT DETECTED Final   Pseudomonas aeruginosa NOT DETECTED NOT DETECTED Final   Candida albicans NOT DETECTED NOT DETECTED Final   Candida glabrata NOT DETECTED NOT DETECTED Final   Candida krusei NOT DETECTED NOT DETECTED Final   Candida parapsilosis NOT DETECTED NOT DETECTED Final   Candida tropicalis NOT DETECTED NOT DETECTED Final    Comment: Performed at [facility], [address] [place], [place] [address]  Blood culture (routine x 2)     Status: Abnormal   Collection Time: 07/13/19  3:02 PM   Specimen: BLOOD RIGHT ARM  Result Value Ref Range Status   Specimen Description   Final    BLOOD RIGHT ARM Performed at [facility], [address], [place], [place] [address]    Special Requests   Final    BOTTLES DRAWN AEROBIC AND ANAEROBIC Blood Culture adequate volume Performed at [facility], [address] [place], [place] [address]    Culture  Setup Time   Final    AEROBIC BOTTLE ONLY GRAM POSITIVE COCCI Gram Stain Report Called to,Read Back By and Verified With: C MARSHALL,RN @0439  07/14/19 MKELLY GRAM POSITIVE COCCI ANAEROBIC BOTTLE ONLY RESULT PREV. CALLED  Performed at [facility], [address], [place], [place] [address]    Culture (A)  Final    STAPHYLOCOCCUS AUREUS SUSCEPTIBILITIES PERFORMED ON PREVIOUS CULTURE WITHIN THE LAST 5 DAYS. Performed at [facility], [place] [address] [place], [place] [address]    Report Status 07/16/2019 FINAL  Final  SARS Coronavirus 2 by RT PCR (hospital order, performed in [facility] hospital lab) Nasopharyngeal Nasopharyngeal Swab     Status: None   Collection Time: 07/13/19  6:10 PM   Specimen: Nasopharyngeal Swab  Result Value Ref Range Status   SARS Coronavirus 2 NEGATIVE NEGATIVE Final    Comment: (NOTE) SARS-CoV-2 target nucleic acids are NOT DETECTED.  The SARS-CoV-2 RNA is generally detectable in upper and lower respiratory  specimens during the acute phase of  infection. The lowest concentration of SARS-CoV-2 viral copies this assay can detect is 250 copies / mL. A negative result does not preclude SARS-CoV-2 infection and should not be used as the sole basis for treatment or other patient management decisions.  A negative result may occur with improper specimen collection / handling, submission of specimen other than nasopharyngeal swab, presence of viral mutation(s) within the areas targeted by this assay, and inadequate number of viral copies (<250 copies / mL). A negative result must be combined with clinical observations, patient history, and epidemiological information.  Fact Sheet for Patients:   StrictlyIdeas.no  Fact Sheet for Healthcare Providers: BankingDealers.co.za  This test is not yet approved or  cleared by the [place] FDA and has been authorized for detection and/or diagnosis of SARS-CoV-2 by FDA under an Emergency Use Authorization (EUA).  This EUA will remain in effect (meaning this test can be used) for the duration of the COVID-19 declaration under Section 564(b)(1) of the Act, 21 U.S.C. section 360bbb-3(b)(1), unless the authorization is terminated or revoked sooner.  Performed at [facility], [address] [place], [place] [address]   Aerobic/Anaerobic Culture (surgical/deep wound)     Status: None (Preliminary result)   Collection Time: 07/14/19  3:28 PM   Specimen: PATH Other; Tissue  Result Value Ref Range Status   Specimen Description WOUND LEFT HIP INCISION  Final   Special Requests NONE  Final   Gram Stain   Final    ABUNDANT WBC PRESENT, PREDOMINANTLY PMN FEW GRAM POSITIVE COCCI Performed at [facility], [place] [address] [place], [place] [address]    Culture   Final    FEW STAPHYLOCOCCUS AUREUS NO ANAEROBES ISOLATED; CULTURE IN PROGRESS FOR 5 DAYS    Report Status PENDING  Incomplete   Organism ID, Bacteria STAPHYLOCOCCUS AUREUS  Final       Susceptibility   Staphylococcus aureus - MIC*    CIPROFLOXACIN <=0.5 SENSITIVE Sensitive     ERYTHROMYCIN <=0.25 SENSITIVE Sensitive     GENTAMICIN <=0.5 SENSITIVE Sensitive     OXACILLIN 0.5 SENSITIVE Sensitive     TETRACYCLINE <=1 SENSITIVE Sensitive     VANCOMYCIN <=0.5 SENSITIVE Sensitive     TRIMETH/SULFA <=10 SENSITIVE Sensitive     CLINDAMYCIN <=0.25 SENSITIVE Sensitive     RIFAMPIN <=0.5 SENSITIVE Sensitive     Inducible Clindamycin NEGATIVE Sensitive     * FEW STAPHYLOCOCCUS AUREUS  Culture, blood (Routine X 2) w Reflex to ID Panel     Status: None (Preliminary result)   Collection Time: 07/15/19  7:24 AM   Specimen: BLOOD RIGHT ARM  Result Value Ref Range Status   Specimen Description BLOOD RIGHT ARM  Final   Special Requests   Final    AEROBIC BOTTLE ONLY Blood Culture results may not be optimal due to an inadequate volume of blood received in culture bottles   Culture   Final    NO GROWTH 3 DAYS Performed at [facility], [place] [address] [place], [place] [address]    Report Status PENDING  Incomplete  Culture, blood (Routine X 2) w Reflex to ID Panel     Status: None (Preliminary result)   Collection Time: 07/15/19  7:33 AM   Specimen: BLOOD RIGHT HAND  Result Value Ref Range Status   Specimen Description BLOOD RIGHT HAND  Final   Special Requests   Final    BOTTLES DRAWN AEROBIC AND ANAEROBIC Blood Culture  adequate volume   Culture   Final    NO GROWTH 3 DAYS Performed at [facility], [place] [address] [place], [place] [address]    Report Status PENDING  Incomplete  Surgical pcr screen     Status: Abnormal   Collection Time: 07/17/19  7:49 AM   Specimen: Nasal Mucosa; Nasal Swab  Result Value Ref Range Status   MRSA, PCR NEGATIVE NEGATIVE Final   Staphylococcus aureus POSITIVE (A) NEGATIVE Final    Comment: (NOTE) The Xpert SA Assay (FDA approved for NASAL specimens in patients 22 years of age and older), is one component of a  comprehensive surveillance program. It is not intended to diagnose infection nor to guide or monitor treatment. Performed at [facility], [place] [address] [place], [place] [address]     Janene Madeira, MSN, NP-C [facility] for Infectious Disease [email or web address] Pager: [phone] Office: [phone] [place]: [phone]

## 2019-07-18 NOTE — Progress Notes (Signed)
PT Cancellation Note  Patient Details Name: Max Pittman. MRN: [id number] DOB: Feb 17, 1975   Cancelled Treatment:    Reason Eval/Treat Not Completed: Patient declined, no reason specified;Pain limiting ability to participate Pt reports feeling really sore today due to fall earlier this AM and declines mobility today. Will follow.   Marguarite Arbour A Jensen [place] 07/18/2019, 11:17 AM Marisa Severin, PT, DPT Acute Rehabilitation Services Pager [phone] Office [phone]

## 2019-07-18 NOTE — Progress Notes (Addendum)
[facility] for Heparin Indication: DVT  Allergies  Allergen Reactions  . Doxycycline Hives  . Latex Swelling    Pt reports swelling at site.   . Morphine And Related Anxiety    Patient Measurements: Height: 5\' 9"  (175.3 cm) Weight: 123.3 kg (271 lb 13.2 oz) IBW/kg (Calculated) : 70.7 Heparin Dosing Weight: 96.7 kg  Vital Signs: Temp: 97.6 F (36.4 C) (06/22 0416) Temp Source: Oral (06/22 0416) BP: 116/71 (06/22 0416) Pulse Rate: 83 (06/22 0416)  Labs: Recent Labs    07/16/19 0417 07/16/19 1423 07/17/19 0718 07/17/19 2216 07/18/19 0408  HGB 8.0*  --  8.0*  --  7.6*  HCT 25.8*  --  26.0*  --  24.8*  PLT 264  --  256  --  235  LABPROT 15.5*  --  16.0*  --  16.7*  INR 1.3*  --  1.3*  --  1.4*  HEPARINUNFRC 0.21*   < > 0.52 0.26* 0.51   < > = values in this interval not displayed.    Estimated Creatinine Clearance: 12.1 mL/min (A) (by C-G formula based on SCr of 10.23 mg/dL (H)).   Medical History: Past Medical History:  Diagnosis Date  . Arthritis   . Closed dislocation of left hip ([place]) 05/21/2019  . Diabetes ([place])   . Diabetes mellitus without complication ([place])   . History of peritoneal dialysis   . Hypertension   . Renal disorder    FFGS  . Renal disorder   . Stroke [facility])    when ha was a child  . Vasovagal syndrome    with syncope    Medications:  Medications Prior to Admission  Medication Sig Dispense Refill Last Dose  . acetaminophen (TYLENOL) 325 MG tablet Take 2 tablets (650 mg total) by mouth every 8 (eight) hours.   07/12/2019 at Unknown time  . amLODipine (NORVASC) 5 MG tablet Take 1 tablet (5 mg total) by mouth daily. 30 tablet 0 07/12/2019 at Unknown time  . aspirin EC 81 MG EC tablet Take 1 tablet (81 mg total) by mouth daily.   07/12/2019 at Unknown time  . atorvastatin (LIPITOR) 40 MG tablet Take 1 tablet (40 mg total) by mouth daily at 6 PM. 31 tablet 6 07/12/2019 at Unknown time  . buPROPion  (WELLBUTRIN SR) 150 MG 12 hr tablet Take 1 tablet (150 mg total) by mouth 2 (two) times daily with a meal. 60 tablet 0 07/12/2019 at Unknown time  . cholecalciferol (VITAMIN D) 25 MCG tablet Take 2 tablets (2,000 Units total) by mouth 2 (two) times daily. 60 tablet 0 07/12/2019 at Unknown time  . docusate sodium (COLACE) 100 MG capsule Take 300 mg by mouth daily as needed for mild constipation or moderate constipation. As directed    Past Month at Unknown time  . HYDROcodone-acetaminophen (NORCO/VICODIN) 5-325 MG tablet Take 1 tablet by mouth every 8 (eight) hours as needed.   Past Week at Unknown time  . loratadine (CLARITIN) 10 MG tablet Take 1 tablet (10 mg total) by mouth daily. 30 tablet 0 07/12/2019 at Unknown time  . methocarbamol (ROBAXIN) 500 MG tablet Take 2 tablets (1,000 mg total) by mouth 3 (three) times daily. 90 tablet 0 07/11/2019 at Unknown time  . metoprolol tartrate (LOPRESSOR) 25 MG tablet Take 0.5 tablets (12.5 mg total) by mouth 2 (two) times daily. 30 tablet 0 07/12/2019 at 2000  . omeprazole (PRILOSEC) 20 MG capsule Take 1 capsule (20 mg total)  by mouth daily. 30 capsule 0 07/12/2019 at Unknown time  . pioglitazone (ACTOS) 15 MG tablet Take 1 tablet (15 mg total) by mouth daily. 30 tablet 0 07/12/2019 at Unknown time  . pregabalin (LYRICA) 50 MG capsule Take 1 capsule (50 mg total) by mouth at bedtime. 30 capsule 0 07/12/2019 at Unknown time  . sevelamer carbonate (RENVELA) 800 MG tablet Take 3 tablets (2,400 mg total) by mouth 3 (three) times daily with meals. 240 tablet 1 07/12/2019 at Unknown time  . warfarin (COUMADIN) 2 MG tablet Take 1 tablet (2 mg total) by mouth daily. Or as directed by [facility] (Patient taking differently: Take 2 mg by mouth every evening. ) 30 tablet 4 07/12/2019 at 2000  . oxyCODONE (OXYCONTIN) 10 mg 12 hr tablet Take 1 tablet (10 mg total) by mouth daily. (Patient not taking: Reported on 07/13/2019) 14 tablet 0 Not Taking at Unknown time     Assessment: Patient is post op day 1 with wound vac and peritoneal dialysis.  On warfarin for DVT which was stopped for surgery.  Received phytonadione 5 mg IV 07/14/19 0037.  Heparin infusion per pharmacy consult ordered today with plans for Orthopedic Surgery to dictate when to start warfarin.  Patient was previously on a heparin infusion last week and a heparin level of 0.26 was achieved with heparin infusing at 1800 units/hr. D/W MD, lower heparin goal is not needed, therefore will increase goal to 0.3-0.7 once restarted.  Heparin level this morning 0.51.  Hgb 8>>7.6, PLTC stable, no bleeding noted.    Goal of Therapy:  Heparin Level goal = 0.3-0.7 Monitor platelets by anticoagulation protocol: Yes   Plan:  Continue IV heparin at 2000 units/hr Will recheck in 6 hours Heparin level daily f/u restart warfarin   [place] Perry A. Levada Dy, PharmD, BCPS, FNKF Clinical Pharmacist [place] Please utilize Amion for appropriate phone number to reach the unit pharmacist ([place])  Addendum: Confirmatory HL 0.6, Continue current rate. HL in AM  Eleanor Gatliff A. Levada Dy, PharmD, BCPS, FNKF Clinical Pharmacist [place] Please utilize Amion for appropriate phone number to reach the unit pharmacist ([place])

## 2019-07-19 ENCOUNTER — Inpatient Hospital Stay (HOSPITAL_COMMUNITY): Payer: Medicare Other

## 2019-07-19 ENCOUNTER — Encounter (HOSPITAL_COMMUNITY): Payer: Self-pay | Admitting: Internal Medicine

## 2019-07-19 ENCOUNTER — Inpatient Hospital Stay (HOSPITAL_COMMUNITY): Payer: Medicare Other | Admitting: Anesthesiology

## 2019-07-19 ENCOUNTER — Encounter (HOSPITAL_COMMUNITY)
Admission: AD | Disposition: A | Discharge status: Home Health | Payer: Self-pay | Source: Home / Self Care | Attending: Internal Medicine

## 2019-07-19 DIAGNOSIS — E118 Type 2 diabetes mellitus with unspecified complications: Secondary | ICD-10-CM

## 2019-07-19 DIAGNOSIS — Z7901 Long term (current) use of anticoagulants: Secondary | ICD-10-CM

## 2019-07-19 DIAGNOSIS — I340 Nonrheumatic mitral (valve) insufficiency: Secondary | ICD-10-CM

## 2019-07-19 DIAGNOSIS — I639 Cerebral infarction, unspecified: Secondary | ICD-10-CM

## 2019-07-19 DIAGNOSIS — E1142 Type 2 diabetes mellitus with diabetic polyneuropathy: Secondary | ICD-10-CM

## 2019-07-19 HISTORY — PX: TEE WITHOUT CARDIOVERSION: SHX5443

## 2019-07-19 LAB — HEPARIN LEVEL (UNFRACTIONATED): Heparin Unfractionated: 0.61 IU/mL (ref 0.30–0.70)

## 2019-07-19 LAB — RENAL FUNCTION PANEL
Albumin: 1.5 g/dL — ABNORMAL LOW (ref 3.5–5.0)
Anion gap: 14 (ref 5–15)
BUN: 43 mg/dL — ABNORMAL HIGH (ref 6–20)
CO2: 26 mmol/L (ref 22–32)
Calcium: 9 mg/dL (ref 8.9–10.3)
Chloride: 94 mmol/L — ABNORMAL LOW (ref 98–111)
Creatinine, Ser: 8.58 mg/dL — ABNORMAL HIGH (ref 0.61–1.24)
GFR calc Af Amer: 8 mL/min — ABNORMAL LOW (ref >60)
GFR calc non Af Amer: 7 mL/min — ABNORMAL LOW (ref >60)
Glucose, Bld: 176 mg/dL — ABNORMAL HIGH (ref 70–99)
Phosphorus: 4.6 mg/dL (ref 2.5–4.6)
Potassium: 2.9 mmol/L — ABNORMAL LOW (ref 3.5–5.1)
Sodium: 134 mmol/L — ABNORMAL LOW (ref 135–145)

## 2019-07-19 LAB — ECHOCARDIOGRAM LIMITED
Height: 69 in
Weight: 4299.85 oz

## 2019-07-19 LAB — GLUCOSE, CAPILLARY
Glucose-Capillary: 112 mg/dL — ABNORMAL HIGH (ref 70–99)
Glucose-Capillary: 168 mg/dL — ABNORMAL HIGH (ref 70–99)
Glucose-Capillary: 168 mg/dL — ABNORMAL HIGH (ref 70–99)
Glucose-Capillary: 99 mg/dL (ref 70–99)
Glucose-Capillary: 99 mg/dL (ref 70–99)

## 2019-07-19 LAB — AEROBIC/ANAEROBIC CULTURE W GRAM STAIN (SURGICAL/DEEP WOUND)

## 2019-07-19 LAB — PROTIME-INR
INR: 1.4 — ABNORMAL HIGH (ref 0.8–1.2)
Prothrombin Time: 16.3 seconds — ABNORMAL HIGH (ref 11.4–15.2)

## 2019-07-19 IMAGING — DX DG KNEE 1-2V*L*
2 series · 2 of 2 positions shown · non-contrast
Comparison: None.

CLINICAL DATA: Status post fall 2 days ago.

EXAM:
LEFT KNEE - 1-2 VIEW

[x knee ap left]
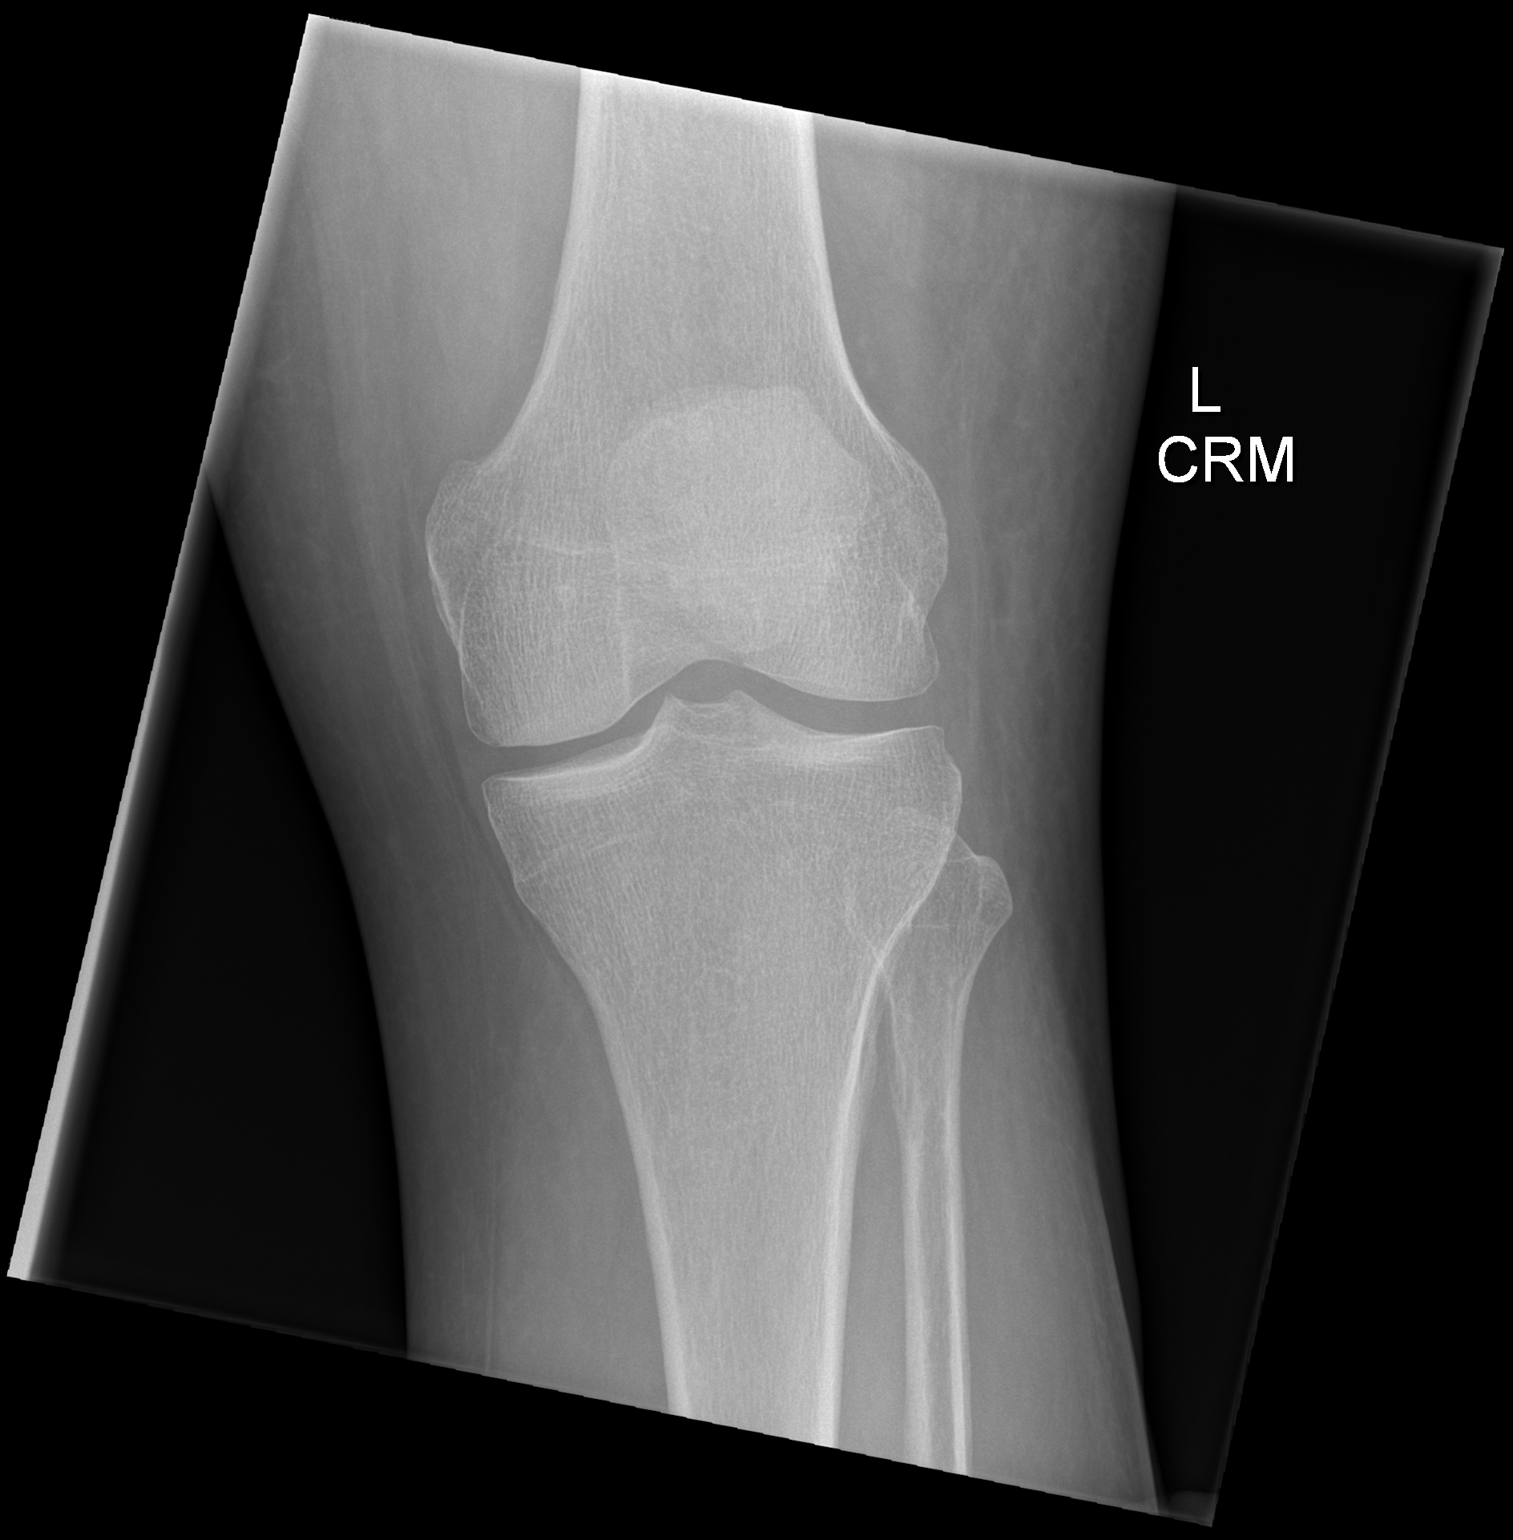

[x knee lat left]
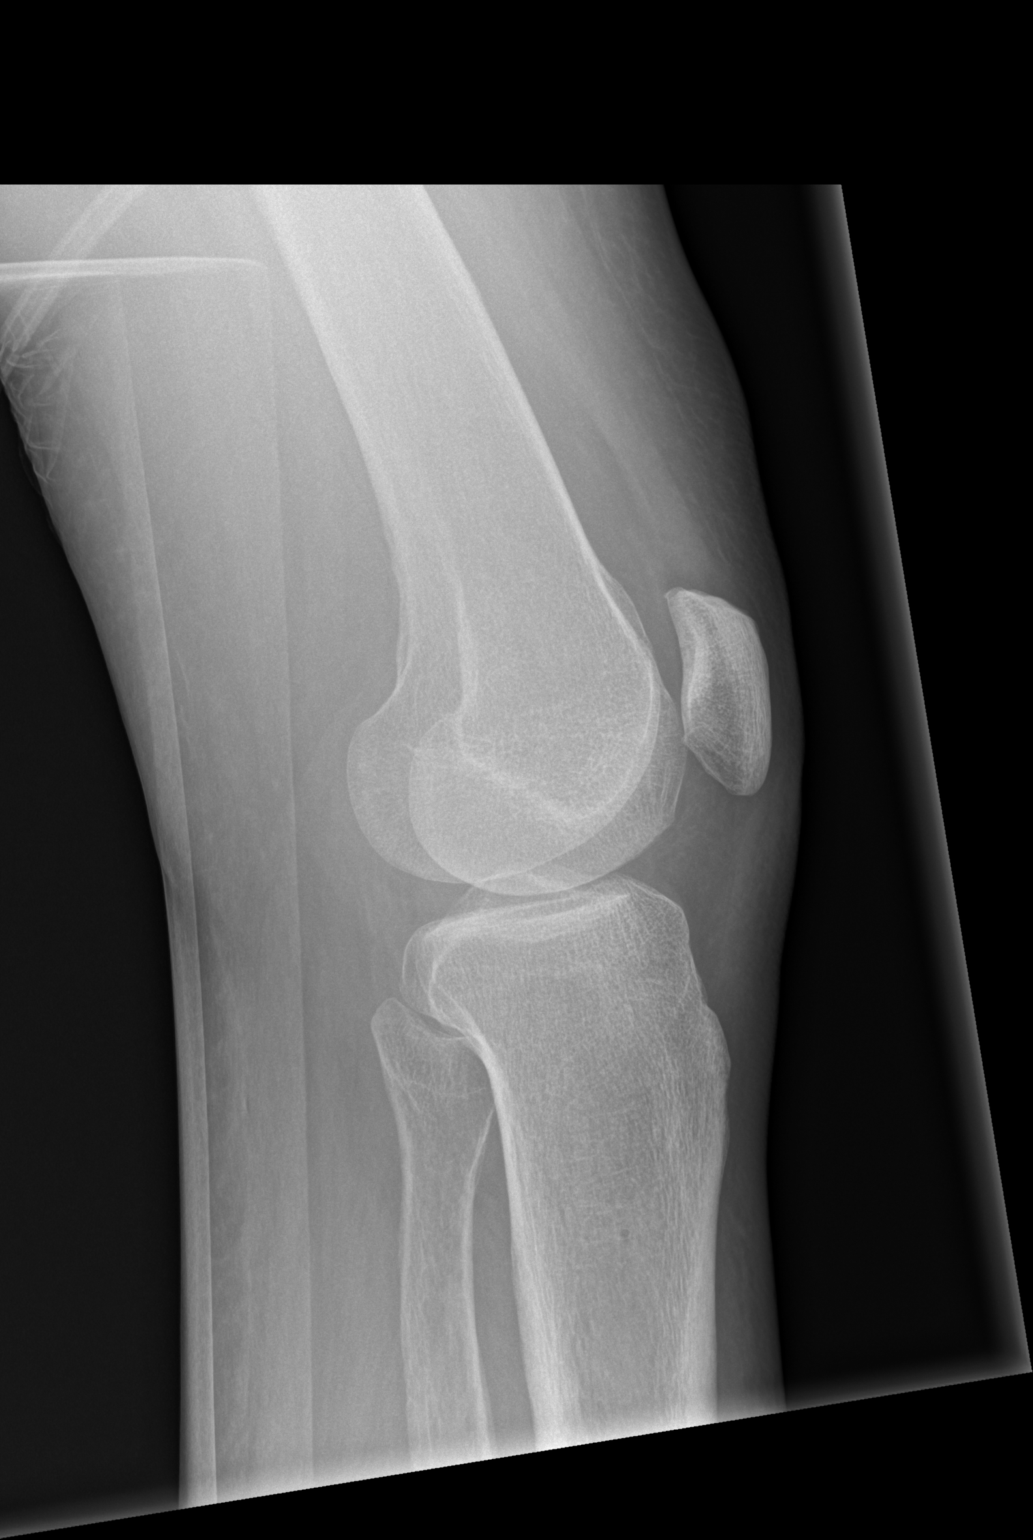

[2 of 2 positions shown; findings below may reference images not displayed]

FINDINGS: No evidence of fracture, dislocation, or joint effusion. No evidence
of arthropathy or other focal bone abnormality. Soft tissues are
unremarkable.
IMPRESSION: Negative.

## 2019-07-19 IMAGING — DX DG PELVIS 1-2V
1 series · 1 of 1 positions shown · non-contrast
Comparison: [DATE]

CLINICAL DATA: Status post fall 2 days ago.

EXAM:
PELVIS - 1-2 VIEW

[x pelvis]
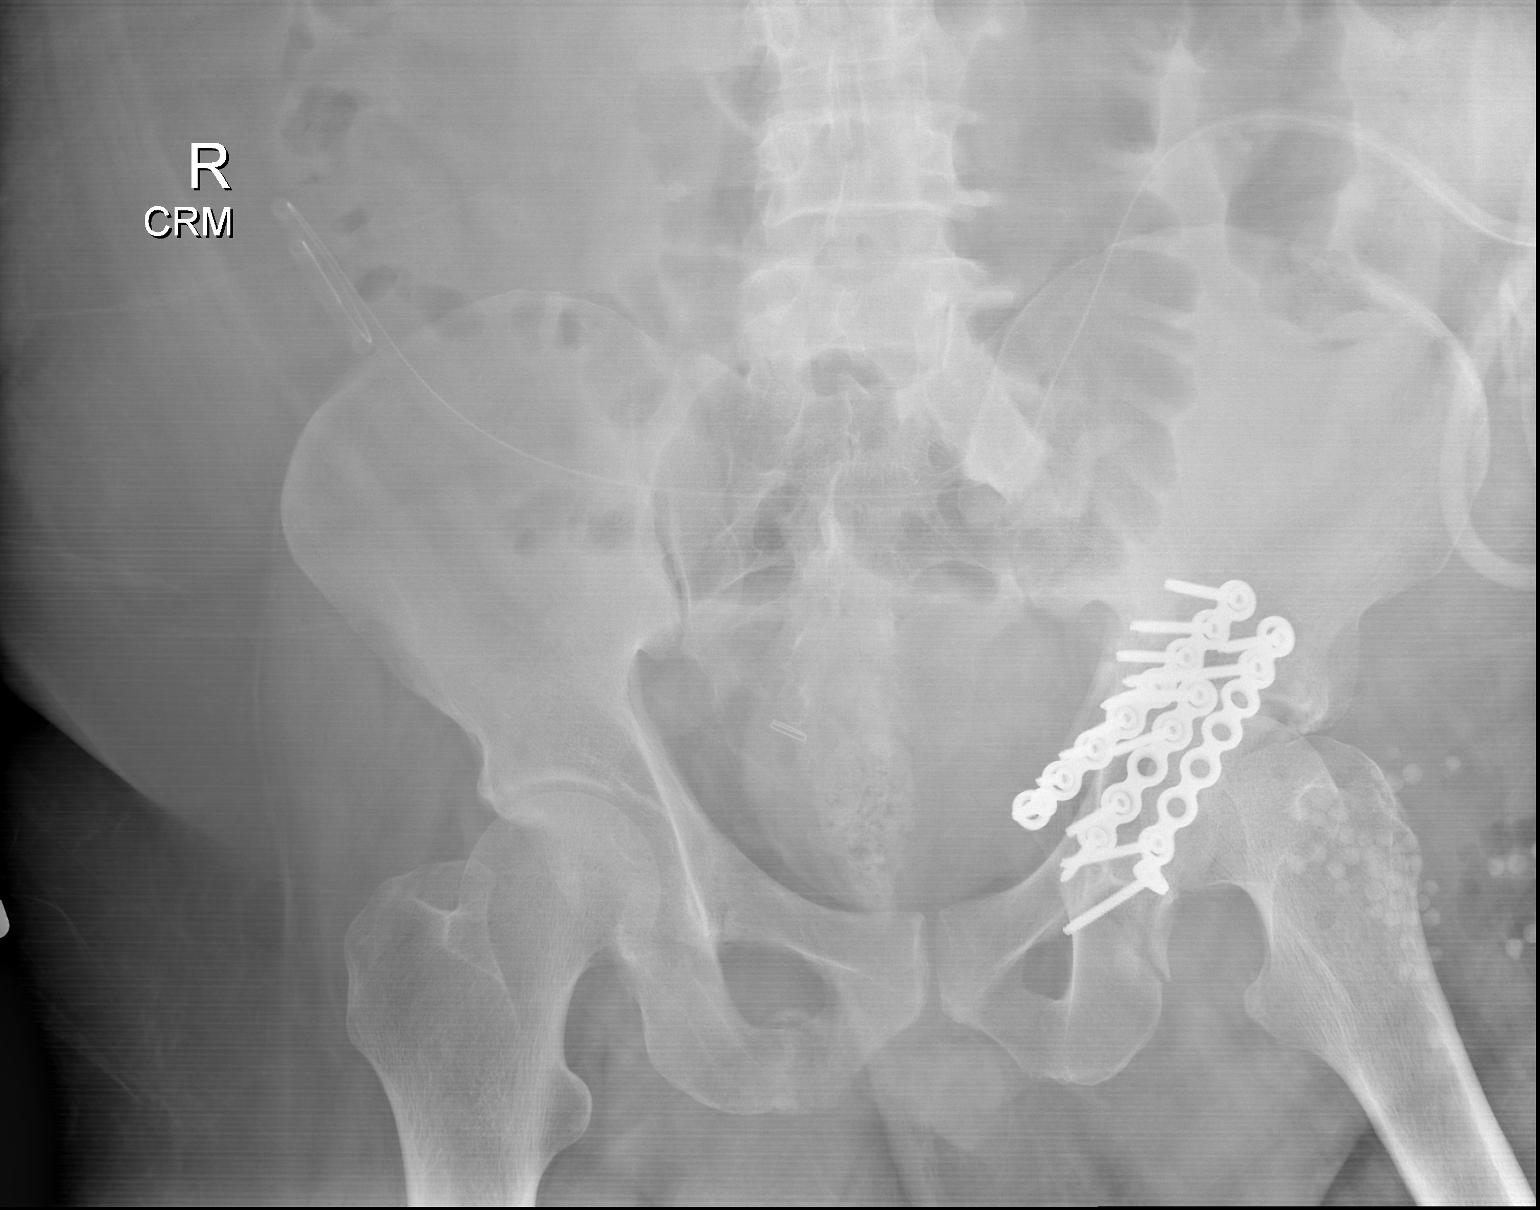

[1 of 1 positions shown; findings below may reference images not displayed]

FINDINGS: Three large radiopaque fixation plate and multiple fixation screws
are again seen overlying the left acetabulum. With a nondisplaced
fracture noted adjacent to the inferior aspect of the left
acetabulum. This is present on the prior study. Fracture deformities
of the right superior and right inferior pubic rami are seen. These
are not present on the prior study. A mild amount of callus
formation is suspected along the right inferior pubic ramus
fracture. There is no evidence of dislocation. No pelvic bone
lesions are seen. A peritoneal dialysis catheter is noted.
IMPRESSION: 1. Fracture deformities of the right superior and right inferior
pubic rami, not present on the prior study.
2. Prior open reduction and internal fixation of the left
acetabulum.

## 2019-07-19 SURGERY — ECHOCARDIOGRAM, TRANSESOPHAGEAL
Anesthesia: Monitor Anesthesia Care

## 2019-07-19 MED ORDER — AMLODIPINE BESYLATE 5 MG PO TABS
5.0000 mg | ORAL_TABLET | Freq: Every day | ORAL | Status: DC
  Administered 2019-07-19 – 2019-07-28 (×10): 5 mg via ORAL
  Filled 2019-07-19 (×10): qty 1

## 2019-07-19 MED ORDER — BUTAMBEN-TETRACAINE-BENZOCAINE 2-2-14 % EX AERO
INHALATION_SPRAY | CUTANEOUS | Status: DC | PRN
  Administered 2019-07-19: 2 via TOPICAL

## 2019-07-19 MED ORDER — METOPROLOL TARTRATE 12.5 MG HALF TABLET: 12.5000 mg | ORAL_TABLET | Freq: Two times a day (BID) | ORAL | Status: DC

## 2019-07-19 MED ORDER — SODIUM CHLORIDE 0.9 % IV SOLN: INTRAVENOUS | Status: DC | PRN

## 2019-07-19 MED ORDER — POTASSIUM CHLORIDE CRYS ER 20 MEQ PO TBCR
40.0000 meq | EXTENDED_RELEASE_TABLET | Freq: Once | ORAL | Status: AC
  Administered 2019-07-19: 40 meq via ORAL
  Filled 2019-07-19: qty 2

## 2019-07-19 MED ORDER — METOPROLOL TARTRATE 12.5 MG HALF TABLET
12.5000 mg | ORAL_TABLET | Freq: Two times a day (BID) | ORAL | Status: DC
  Administered 2019-07-19 – 2019-07-28 (×18): 12.5 mg via ORAL
  Filled 2019-07-19 (×18): qty 1

## 2019-07-19 MED ORDER — AMLODIPINE BESYLATE 5 MG PO TABS: 5.0000 mg | ORAL_TABLET | Freq: Every day | ORAL | Status: DC

## 2019-07-19 MED ORDER — PROPOFOL 500 MG/50ML IV EMUL
INTRAVENOUS | Status: DC | PRN
  Administered 2019-07-19: 100 ug/kg/min via INTRAVENOUS

## 2019-07-19 MED ORDER — DARBEPOETIN ALFA 200 MCG/0.4ML IJ SOSY
200.0000 ug | PREFILLED_SYRINGE | INTRAMUSCULAR | Status: DC
  Administered 2019-07-23: 200 ug via SUBCUTANEOUS
  Filled 2019-07-19: qty 0.4

## 2019-07-19 MED ORDER — MIDAZOLAM HCL 5 MG/5ML IJ SOLN
INTRAMUSCULAR | Status: DC | PRN
  Administered 2019-07-19: 2 mg via INTRAVENOUS

## 2019-07-19 NOTE — Plan of Care (Signed)
  Problem: Health Behavior/Discharge Planning: Goal: Ability to manage health-related needs will improve Outcome: Completed/Met   Problem: Clinical Measurements: Goal: Diagnostic test results will improve Outcome: Completed/Met Goal: Cardiovascular complication will be avoided Outcome: Completed/Met   Problem: Nutrition: Goal: Adequate nutrition will be maintained Outcome: Completed/Met   Problem: Coping: Goal: Level of anxiety will decrease Outcome: Completed/Met   Problem: Elimination: Goal: Will not experience complications related to bowel motility Outcome: Completed/Met Goal: Will not experience complications related to urinary retention Outcome: Completed/Met

## 2019-07-19 NOTE — Anesthesia Procedure Notes (Signed)
Procedure Name: MAC Date/Time: 07/19/2019 10:09 AM Performed by: Leonor Liv, CRNA Pre-anesthesia Checklist: Patient identified, Emergency Drugs available, Suction available, Patient being monitored and Timeout performed Patient Re-evaluated:Patient Re-evaluated prior to induction Oxygen Delivery Method: Nasal cannula Airway Equipment and Method: Bite block Placement Confirmation: positive ETCO2 Dental Injury: Teeth and Oropharynx as per pre-operative assessment

## 2019-07-19 NOTE — Anesthesia Postprocedure Evaluation (Signed)
Anesthesia Post Note  Patient: Max Pittman.  Procedure(s) Performed: TRANSESOPHAGEAL ECHOCARDIOGRAM (TEE) (N/A )     Patient location during evaluation: Endoscopy Anesthesia Type: MAC Level of consciousness: awake and alert Pain management: pain level controlled Vital Signs Assessment: post-procedure vital signs reviewed and stable Respiratory status: spontaneous breathing and respiratory function stable Cardiovascular status: stable Postop Assessment: no apparent nausea or vomiting Anesthetic complications: no   No complications documented.  Last Vitals:  Vitals:   07/19/19 1102 07/19/19 1129  BP: (!) 158/66 (!) 158/74  Pulse: 80 85  Resp: 17 18  Temp:  36.5 C  SpO2: 94% 97%    Last Pain:  Vitals:   07/19/19 1129  TempSrc: Oral  PainSc:                  [name]

## 2019-07-19 NOTE — Progress Notes (Signed)
Occupational Therapy Evaluation Patient Details Name: Max Pittman. MRN: [id number] DOB: 1975/02/08 Today's Date: 07/19/2019    History of Present Illness 44 y.o. male with medical history significant for ESRD on peritoneal dialysis, diabetes mellitus, hypertension, DVT. Pt with L hip surgery in April, having persistent pain in this hip since, unable to walk recently due to pain. 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 Pt also hospitalized 6/4-10 for respiratory failure. Pt with L hip fluid collection, undergoing I&D L hip on 6/18. 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   Clinical Impression   PTA, pt lives with girlfriend and was recently discharged from inpatient rehab. Pt reports using RW for mobility around the home, some difficulty with ADLs but able to complete with use of AE. Pt presents now with increased pain, decreased strength, and impaired standing balance impacting ability to safely complete ADLs. Pt required Max A for bed mobility with assistance needed to advance painful L LE. Pt reporting mild dizziness sitting EOB that passed after rest break. With encouragement, pt demonstrated sit to stand at bedside with Min A and using RW, as well as advancement to [facility] with side steps. HR remained stable, under 107 bpm. Pt expressed pleasant surprise at progress from this morning's therapy session and motivated to continue working with therapy. Based on potential for progress and history of rehab success, recommend CIR to maximize safety and confidence with daily tasks. Will continue to follow acutely.     Follow Up Recommendations  CIR;Supervision/Assistance - 24 hour    Equipment Recommendations  None recommended by OT (pt has needed equipment at home)    Recommendations for Other Services Rehab consult     Precautions / Restrictions Precautions Precautions: Fall;Posterior Hip Precaution  Booklet Issued: No Restrictions Weight Bearing Restrictions: Yes LLE Weight Bearing: Partial weight bearing LLE Partial Weight Bearing Percentage or Pounds: 50% PWC in order set. TWB in op note      Mobility Bed Mobility Overal bed mobility: Needs Assistance Bed Mobility: Supine to Sit;Sit to Supine     Supine to sit: Max assist Sit to supine: Max assist   General bed mobility comments: Max A for bed mobility to R side of the bed due to limitations in IV line length - unable to transfer to L side of bed, which pt reports is easiest. Increased assistanced needed to advance L LE to EOB  Transfers Overall transfer level: Needs assistance Equipment used: Rolling walker (2 wheeled) Transfers: Sit to/from Stand Sit to Stand: Min assist         General transfer comment: Min A for sit to stand at bedside with RW. no cues needed for proper hand placement. Pt then completed side steps to [facility] with shimmy-like motions min guard with RW.    Balance Overall balance assessment: Needs assistance Sitting-balance support: Single extremity supported;Feet supported Sitting balance-Leahy Scale: Good     Standing balance support: Bilateral upper extremity supported Standing balance-Leahy Scale: Poor Standing balance comment: requires B UE support on RW to maintain balance and decrease anxiety                           ADL either performed or assessed with clinical judgement   ADL Overall ADL's : Needs assistance/impaired Eating/Feeding: Independent;Sitting   Grooming: Standing;Minimal assistance   Upper Body Bathing: Set up;Sitting   Lower Body Bathing:  Moderate assistance;Sit to/from stand;Sitting/lateral leans   Upper Body Dressing : Set up;Sitting   Lower Body Dressing: Maximal assistance;Sit to/from stand;Sitting/lateral leans Lower Body Dressing Details (indicate cue type and reason): Pt Max A to don socks sitting EOB due to L LE pain and limitations Toilet Transfer:  Moderate assistance;Stand-pivot;BSC   Toileting- Clothing Manipulation and Hygiene: Minimal assistance;Sitting/lateral lean;Sit to/from stand       Functional mobility during ADLs: +2 for safety/equipment General ADL Comments: Pt able to recall 2/3 posterior hip precations, unable to recall no bending past 90*. Pt with deficits in pain, strength, endurance, and dynamic standing balance impacting ability to complete ADLs     Vision Baseline Vision/History: No visual deficits Vision Assessment?: No apparent visual deficits     Perception     Praxis      Pertinent Vitals/Pain Pain Assessment: 0-10 Pain Score: 8  Pain Location: L thigh/hip Pain Descriptors / Indicators: Aching;Grimacing Pain Intervention(s): Patient requesting pain meds-RN notified;Monitored during session;Limited activity within patient's tolerance;Repositioned     Hand Dominance Right   Extremity/Trunk Assessment Upper Extremity Assessment Upper Extremity Assessment: Generalized weakness;RUE deficits/detail RUE Deficits / Details: R 2nd digit with osteomyelitis at tip, pt reports pain and abnormal sensation    Lower Extremity Assessment Lower Extremity Assessment: Defer to PT evaluation   Cervical / Trunk Assessment Cervical / Trunk Assessment: Normal   Communication Communication Communication: No difficulties   Cognition Arousal/Alertness: Awake/alert Behavior During Therapy: WFL for tasks assessed/performed;Anxious Overall Cognitive Status: Within Functional Limits for tasks assessed                                     General Comments  HR up to 107bpm max, down to 95 by end of session. Provided education to pt on hip precautions with ADLs with pt verbalizing understanding. May require reinforcement of education    Exercises     Shoulder Instructions      Home Living Family/patient expects to be discharged to:: Private residence Living Arrangements: Spouse/significant  other Available Help at Discharge: Available 24 hours/day;Family Type of Home: Mobile home Home Access: Stairs to enter Entrance Stairs-Number of Steps: 3 Entrance Stairs-Rails: Left Home Layout: One level     Bathroom Shower/Tub: Teacher, early years/pre: Standard Bathroom Accessibility: Yes How Accessible: Accessible via walker Home Equipment: [place] - 2 wheels;Bedside commode;Adaptive equipment Adaptive Equipment: Reacher;Sock aid Additional Comments: Per pt, bathroom is walker accessible if his walker is standard and not wide      Prior Functioning/Environment Level of Independence: Needs assistance  Gait / Transfers Assistance Needed: pt reports ambulating with RW at home without assistance after discharge home from rehab. Prior to recent medical complications, pt reports walking without AD  ADL's / Homemaking Assistance Needed: Able to perform ADLs with some difficulty using AE since disharge home from rehab approx 2 weeks ago   Comments: girlfriend assists with PD        OT Problem List: Decreased strength;Decreased activity tolerance;Impaired balance (sitting and/or standing);Decreased knowledge of use of DME or AE;Pain      OT Treatment/Interventions: Self-care/ADL training;Therapeutic exercise;Energy conservation;DME and/or AE instruction;Therapeutic activities;Patient/family education    OT Goals(Current goals can be found in the care plan section) Acute Rehab OT Goals Patient Stated Goal: do whatever it takes to get better and go home, pain control OT Goal Formulation: With patient Time For Goal Achievement: 08/02/19 Potential to Achieve Goals:  Good ADL Goals Pt Will Perform Grooming: with supervision;standing Pt Will Perform Lower Body Bathing: with min guard assist;sit to/from stand;sitting/lateral leans Pt Will Perform Lower Body Dressing: with min assist;with adaptive equipment;sitting/lateral leans;sit to/from stand Pt Will Transfer to Toilet: with  min assist;stand pivot transfer;bedside commode Pt Will Perform Toileting - Clothing Manipulation and hygiene: with supervision;sit to/from stand;sitting/lateral leans Additional ADL Goal #1: Pt will verbalize 3/3 posterior hip precautions without prompts in order to promote optimal healing and increased safety with ADLs/mobility.  OT Frequency: Min 2X/week   Barriers to D/C:            Co-evaluation              AM-PAC OT "6 Clicks" Daily Activity     Outcome Measure Help from another person eating meals?: None Help from another person taking care of personal grooming?: A Little Help from another person toileting, which includes using toliet, bedpan, or urinal?: A Little Help from another person bathing (including washing, rinsing, drying)?: A Lot Help from another person to put on and taking off regular upper body clothing?: A Little Help from another person to put on and taking off regular lower body clothing?: A Lot 6 Click Score: 17   End of Session Equipment Utilized During Treatment: Gait belt;Rolling walker Nurse Communication: Mobility status;Patient requests pain meds  Activity Tolerance: Patient tolerated treatment well Patient left: in bed;with call bell/phone within reach  OT Visit Diagnosis: Unsteadiness on feet (R26.81);Muscle weakness (generalized) (M62.81);History of falling (Z91.81);Pain Pain - Right/Left: Left Pain - part of body: Hip                Time: 1102-1117 OT Time Calculation (min): 41 min Charges:  OT General Charges $OT Visit: 1 Visit OT Evaluation $OT Eval Moderate Complexity: 1 Mod OT Treatments $Self Care/Home Management : 8-22 mins $Therapeutic Activity: 8-22 mins  Layla Maw, OTR/L  Layla Maw 07/19/2019, 4:03 PM

## 2019-07-19 NOTE — Progress Notes (Signed)
Patient ID: Max Chute., male   DOB: [birth year], 44 y.o.   MRN: [id number] Patient I had a long discussion we will plan for surgical intervention this Friday in the form of DIP level amputation.  I discussed this with infectious disease today.  This in my opinion will effectively render a cure for the osteomyelitis and decrease infection issues elsewhere.  Kaiyana Bedore MD

## 2019-07-19 NOTE — Progress Notes (Signed)
[facility]  Date of Admission:  07/13/2019      Total days of antibiotics 7  Day 6 cefazolin           ASSESSMENT: Max Pittman. is a 44 y.o. male with MSSA bacteremia complicated by infected left hip hardware with abscess and chronic indwelling HD line. He has cleared his blood cultures and can have tunneled PICC placed at any time. His TEE report from this morning indicates no prosthetic valve or native valve endocarditis - I shared this with him today. We discussed treatment options and will proceed with IV cefazolin x 6 weeks with suppressive cephalexin thereafter given hardware in place.   Increase in WBC likely stress response from surgeries.   Still having some left leg pain - feels like the knee pain is from fall the other night. Ortho team to evaluate with Xray of knee. On exam he has pitting edema from toes to hip. No joint effusion or TTP manipulating knee cap. Follow up Xray results.  He was using home PT prior to current admission but presently PT has recommended CIR for him. He has not given though to this yet.   He would like to see if we can arrange amputation of finger prior to discharge if possible.     PLAN: 1. Continue Cefazolin  2. OK to place PICC line from ID perspective 3. ?if amputation could be performed during current admission  4. Follow left knee XRay / condition     OPAT ORDERS:  Diagnosis: L hip abscess a/w hardware   Culture Result: MSSA  Allergies  Allergen Reactions  . Doxycycline Hives  . Latex Swelling    Pt reports swelling at site.   . Morphine And Related Anxiety     Discharge antibiotics to be given via PICC line:  Per pharmacy protocol Cefazolin    Duration: 6 weeks    End Date: July 29th     [place] Per Protocol with [employer] Use: Home health RN for IV administration and teaching, line care and labs.    Labs weekly while on IV antibiotics: _x_ CBC with  differential __ BMP _x_ CMP _x_ CRP _x_ ESR __ Vancomycin trough __ CK  __ Please pull PIC at completion of IV antibiotics _x_ Please leave PIC in place - Needs IR appt to remove   Fax weekly labs to [phone]  Clinic Follow Up Appt: Dr. Megan Salon 08/22/2020 @ 9:00 am with video visit    Principal Problem:   MSSA bacteremia Active Problems:   Abscess of left hip   Osteomyelitis of finger of right hand ([place])   Peritoneal dialysis status ([place])   Diabetes mellitus ([place])   Multiple closed pelvic fractures with disruption of pelvic circle ([place])   Diabetic peripheral neuropathy ([place])   Sepsis ([place])   Chronic anticoagulation   S/P TAVR (transcatheter aortic valve replacement)   . atorvastatin  40 mg Oral q1800  . buPROPion  150 mg Oral BID WC  . Chlorhexidine Gluconate Cloth  6 each Topical Daily  . darbepoetin (ARANESP) injection - NON-DIALYSIS  100 mcg Subcutaneous Q Sun-1800  . feeding supplement (PRO-STAT SUGAR FREE 64)  30 mL Oral BID  . gentamicin cream  1 application Topical Daily  . insulin aspart  0-9 Units Subcutaneous Q6H  . mupirocin ointment   Nasal BID  . pregabalin  50 mg Oral QHS  . sevelamer carbonate  2,400  mg Oral TID WC    SUBJECTIVE: Would like to get the finger surgery done while he is here.  Still having some left leg pain - hard to tell if it is from the knee or the hip. Just knows his limitations of movement. Hesitant after fall the other night.    Review of Systems: Review of Systems  Constitutional: Negative for chills, fever and malaise/fatigue.  Respiratory: Negative for cough and shortness of breath.   Cardiovascular: Positive for leg swelling (L hip). Negative for chest pain.  Gastrointestinal: Negative for abdominal pain, diarrhea, nausea and vomiting.  Musculoskeletal: Positive for joint pain.       Left leg pain   Neurological: Negative for dizziness and weakness.    Allergies  Allergen Reactions  . Doxycycline Hives  . Latex  Swelling    Pt reports swelling at site.   . Morphine And Related Anxiety    OBJECTIVE: Vitals:   07/19/19 0946 07/19/19 1042 07/19/19 1052 07/19/19 1102  BP: (!) 167/92 (!) 134/58 (!) 141/68 (!) 158/66  Pulse: 83 82 84 80  Resp: _0 Temp: 98.1 F (36.7 C) 98 F (36.7 C)    TempSrc: Oral Axillary    SpO2: 98% 97% 95% 94%  Weight:      Height:       Body mass index is 39.26 kg/m.  Physical Exam Constitutional:      Appearance: Normal appearance.     Comments: Resting comfortably in bed.   HENT:     Mouth/Throat:     Mouth: Mucous membranes are moist.  Eyes:     General: No scleral icterus. Cardiovascular:     Rate and Rhythm: Normal rate and regular rhythm.     Heart sounds: No murmur heard.   Pulmonary:     Effort: Pulmonary effort is normal.     Breath sounds: Normal breath sounds.  Chest:     Comments: HD line removed - clean dry dressing Abdominal:     General: Bowel sounds are normal. There is no distension.  Musculoskeletal:     Comments: L hip with clean sponge in place. Serous-red tinged drainage in cannister. No peri wound erythema. L knee is normal appearing, no redness or TTP.   R distal pointer finger remains swollen and red with chronic ulceration. No drainage.   Skin:    General: Skin is warm and dry.     Capillary Refill: Capillary refill takes less than 2 seconds.  Neurological:     Mental Status: He is alert and oriented to person, place, and time.     Lab Results Lab Results  Component Value Date   WBC 21.9 (H) 07/18/2019   HGB 7.6 (L) 07/18/2019   HCT 24.8 (L) 07/18/2019   MCV 89.9 07/18/2019   PLT 235 07/18/2019    Lab Results  Component Value Date   CREATININE 8.58 (H) 07/19/2019   BUN 43 (H) 07/19/2019   NA 134 (L) 07/19/2019   K 2.9 (L) 07/19/2019   CL 94 (L) 07/19/2019   CO2 26 07/19/2019    Lab Results  Component Value Date   ALT 11 07/13/2019   AST 14 (L) 07/13/2019   ALKPHOS 122 07/13/2019   BILITOT 0.6  07/13/2019     Microbiology: Recent Results (from the past 240 hour(s))  Blood culture (routine x 2)     Status: Abnormal   Collection Time: 07/13/19  2:06 PM   Specimen: BLOOD RIGHT ARM  Result  Value Ref Range Status   Specimen Description   Final    BLOOD RIGHT ARM Performed at [facility], [address] [place], [place] [address]    Special Requests   Final    BOTTLES DRAWN AEROBIC AND ANAEROBIC Blood Culture adequate volume Performed at [facility], [address], [place], [place] [address]    Culture  Setup Time   Final    IN BOTH AEROBIC AND ANAEROBIC BOTTLES GRAM POSITIVE COCCI Gram Stain Report Called to,Read Back By and Verified With: C MARSHALL,RN _0  07/14/19 MKELLY CRITICAL RESULT CALLED TO, READ BACK BY AND VERIFIED WITH: Roxy Manns  [id number] FCP Performed at [facility], [place] [address], [place], [place] [address]    Culture STAPHYLOCOCCUS AUREUS (A)  Final   Report Status 07/16/2019 FINAL  Final   Organism ID, Bacteria STAPHYLOCOCCUS AUREUS  Final      Susceptibility   Staphylococcus aureus - MIC*    CIPROFLOXACIN <=0.5 SENSITIVE Sensitive     ERYTHROMYCIN <=0.25 SENSITIVE Sensitive     GENTAMICIN <=0.5 SENSITIVE Sensitive     OXACILLIN 0.5 SENSITIVE Sensitive     TETRACYCLINE <=1 SENSITIVE Sensitive     VANCOMYCIN <=0.5 SENSITIVE Sensitive     TRIMETH/SULFA <=10 SENSITIVE Sensitive     CLINDAMYCIN <=0.25 SENSITIVE Sensitive     RIFAMPIN <=0.5 SENSITIVE Sensitive     Inducible Clindamycin NEGATIVE Sensitive     * STAPHYLOCOCCUS AUREUS  Blood Culture ID Panel (Reflexed)     Status: Abnormal   Collection Time: 07/13/19  2:06 PM  Result Value Ref Range Status   Enterococcus species NOT DETECTED NOT DETECTED Final   Listeria monocytogenes NOT DETECTED NOT DETECTED Final   Staphylococcus species DETECTED (A) NOT DETECTED Final    Comment: CRITICAL RESULT CALLED TO, READ BACK BY AND VERIFIED WITH: PHARMD D HALEY D  [id number] FCP     Staphylococcus aureus (BCID) DETECTED (A) NOT DETECTED Final    Comment: Methicillin (oxacillin) susceptible Staphylococcus aureus (MSSA). Preferred therapy is anti staphylococcal beta lactam antibiotic (Cefazolin or Nafcillin), unless clinically contraindicated. CRITICAL RESULT CALLED TO, READ BACK BY AND VERIFIED WITH: Roxy Manns  [id number] FCP    Methicillin resistance NOT DETECTED NOT DETECTED Final   Streptococcus species NOT DETECTED NOT DETECTED Final   Streptococcus agalactiae NOT DETECTED NOT DETECTED Final   Streptococcus pneumoniae NOT DETECTED NOT DETECTED Final   Streptococcus pyogenes NOT DETECTED NOT DETECTED Final   Acinetobacter baumannii NOT DETECTED NOT DETECTED Final   Enterobacteriaceae species NOT DETECTED NOT DETECTED Final   Enterobacter cloacae complex NOT DETECTED NOT DETECTED Final   Escherichia coli NOT DETECTED NOT DETECTED Final   Klebsiella oxytoca NOT DETECTED NOT DETECTED Final   Klebsiella pneumoniae NOT DETECTED NOT DETECTED Final   Proteus species NOT DETECTED NOT DETECTED Final   Serratia marcescens NOT DETECTED NOT DETECTED Final   Haemophilus influenzae NOT DETECTED NOT DETECTED Final   Neisseria meningitidis NOT DETECTED NOT DETECTED Final   Pseudomonas aeruginosa NOT DETECTED NOT DETECTED Final   Candida albicans NOT DETECTED NOT DETECTED Final   Candida glabrata NOT DETECTED NOT DETECTED Final   Candida krusei NOT DETECTED NOT DETECTED Final   Candida parapsilosis NOT DETECTED NOT DETECTED Final   Candida tropicalis NOT DETECTED NOT DETECTED Final    Comment: Performed at [facility], [address], [place], [place] [address]  Blood culture (routine x 2)     Status: Abnormal  Collection Time: 07/13/19  3:02 PM   Specimen: BLOOD RIGHT ARM  Result Value Ref Range Status   Specimen Description   Final    BLOOD RIGHT ARM Performed at [facility], [address], [place], [place] [address]    Special Requests   Final    BOTTLES  DRAWN AEROBIC AND ANAEROBIC Blood Culture adequate volume Performed at [facility], [address], [place], [place] [address]    Culture  Setup Time   Final    AEROBIC BOTTLE ONLY GRAM POSITIVE COCCI Gram Stain Report Called to,Read Back By and Verified With: C MARSHALL,RN _0  07/14/19 MKELLY GRAM POSITIVE COCCI ANAEROBIC BOTTLE ONLY RESULT PREV. CALLED  Performed at [facility], [address], [place], [place] [address]    Culture (A)  Final    STAPHYLOCOCCUS AUREUS SUSCEPTIBILITIES PERFORMED ON PREVIOUS CULTURE WITHIN THE LAST 5 DAYS. Performed at [facility], [place] [address], [place], [place] [address]    Report Status 07/16/2019 FINAL  Final  SARS Coronavirus 2 by RT PCR (hospital order, performed in [facility] hospital lab) Nasopharyngeal Nasopharyngeal Swab     Status: None   Collection Time: 07/13/19  6:10 PM   Specimen: Nasopharyngeal Swab  Result Value Ref Range Status   SARS Coronavirus 2 NEGATIVE NEGATIVE Final    Comment: (NOTE) SARS-CoV-2 target nucleic acids are NOT DETECTED.  The SARS-CoV-2 RNA is generally detectable in upper and lower respiratory specimens during the acute phase of infection. The lowest concentration of SARS-CoV-2 viral copies this assay can detect is 250 copies / mL. A negative result does not preclude SARS-CoV-2 infection and should not be used as the sole basis for treatment or other patient management decisions.  A negative result may occur with improper specimen collection / handling, submission of specimen other than nasopharyngeal swab, presence of viral mutation(s) within the areas targeted by this assay, and inadequate number of viral copies (<250 copies / mL). A negative result must be combined with clinical observations, patient history, and epidemiological information.  Fact Sheet for Patients:   StrictlyIdeas.no  Fact Sheet for Healthcare  Providers: BankingDealers.co.za  This test is not yet approved or  cleared by the [place] FDA and has been authorized for detection and/or diagnosis of SARS-CoV-2 by FDA under an Emergency Use Authorization (EUA).  This EUA will remain in effect (meaning this test can be used) for the duration of the COVID-19 declaration under Section 564(b)(1) of the Act, 21 U.S.C. section 360bbb-3(b)(1), unless the authorization is terminated or revoked sooner.  Performed at [facility], [address] [place], [place] [address]   Aerobic/Anaerobic Culture (surgical/deep wound)     Status: None   Collection Time: 07/14/19  3:28 PM   Specimen: PATH Other; Tissue  Result Value Ref Range Status   Specimen Description WOUND LEFT HIP INCISION  Final   Special Requests NONE  Final   Gram Stain   Final    ABUNDANT WBC PRESENT, PREDOMINANTLY PMN FEW GRAM POSITIVE COCCI    Culture   Final    FEW STAPHYLOCOCCUS AUREUS NO ANAEROBES ISOLATED Performed at [facility], [place] [address] [place], [place] [address]    Report Status 07/19/2019 FINAL  Final   Organism ID, Bacteria STAPHYLOCOCCUS AUREUS  Final      Susceptibility   Staphylococcus aureus - MIC*    CIPROFLOXACIN <=0.5 SENSITIVE Sensitive     ERYTHROMYCIN <=0.25 SENSITIVE Sensitive     GENTAMICIN <=0.5 SENSITIVE Sensitive  OXACILLIN 0.5 SENSITIVE Sensitive     TETRACYCLINE <=1 SENSITIVE Sensitive     VANCOMYCIN <=0.5 SENSITIVE Sensitive     TRIMETH/SULFA <=10 SENSITIVE Sensitive     CLINDAMYCIN <=0.25 SENSITIVE Sensitive     RIFAMPIN <=0.5 SENSITIVE Sensitive     Inducible Clindamycin NEGATIVE Sensitive     * FEW STAPHYLOCOCCUS AUREUS  Culture, blood (Routine X 2) w Reflex to ID Panel     Status: None (Preliminary result)   Collection Time: 07/15/19  7:24 AM   Specimen: BLOOD RIGHT ARM  Result Value Ref Range Status   Specimen Description BLOOD RIGHT ARM  Final   Special Requests   Final    AEROBIC  BOTTLE ONLY Blood Culture results may not be optimal due to an inadequate volume of blood received in culture bottles   Culture   Final    NO GROWTH 4 DAYS Performed at [facility], [place] [address], [place], [place] [address]    Report Status PENDING  Incomplete  Culture, blood (Routine X 2) w Reflex to ID Panel     Status: None (Preliminary result)   Collection Time: 07/15/19  7:33 AM   Specimen: BLOOD RIGHT HAND  Result Value Ref Range Status   Specimen Description BLOOD RIGHT HAND  Final   Special Requests   Final    BOTTLES DRAWN AEROBIC AND ANAEROBIC Blood Culture adequate volume   Culture   Final    NO GROWTH 4 DAYS Performed at [facility], [place] [address], [place], [place] [address]    Report Status PENDING  Incomplete  Surgical pcr screen     Status: Abnormal   Collection Time: 07/17/19  7:49 AM   Specimen: Nasal Mucosa; Nasal Swab  Result Value Ref Range Status   MRSA, PCR NEGATIVE NEGATIVE Final   Staphylococcus aureus POSITIVE (A) NEGATIVE Final    Comment: (NOTE) The Xpert SA Assay (FDA approved for NASAL specimens in patients 85 years of age and older), is one component of a comprehensive surveillance program. It is not intended to diagnose infection nor to guide or monitor treatment. Performed at [facility], [place] [address] [place], [place] [address]     Janene Madeira, MSN, NP-C [facility] [email or web address] Pager: [phone] Office: [phone] [place]: [phone]

## 2019-07-19 NOTE — Progress Notes (Signed)
Orthopaedic Trauma Service Progress Note  Patient ID: Max Pittman. MRN: [id number] DOB/AGE: 1975/07/10 44 y.o.  Subjective:  Confidence is low with mobilizing after a fall yesterday morning while getting back to bed after going to the bathroom. States his R leg gave out on him and he slid down on his right side but is complaining of some L knee pain   About to work with PT  TEE scheduled for today   R index finger DIP amp by hand surgery pending   ROS As above  Objective:   VITALS:   Vitals:   07/18/19 2125 07/19/19 0428 07/19/19 0726 07/19/19 0731  BP: 130/83 117/65  119/77  Pulse: 87 81  86  Resp: 18 18  17   Temp: 98 F (36.7 C) 97.7 F (36.5 C)  (!) 97.5 F (36.4 C)  TempSrc: Oral Oral Oral Oral  SpO2: 99% 93%  94%  Weight:  121.1 kg 120.6 kg   Height:        Estimated body mass index is 39.26 kg/m as calculated from the following:   Height as of this encounter: 5\' 9"  (1.753 m).   Weight as of this encounter: 120.6 kg.   Intake/Output      06/22 0701 - 06/23 0700 06/23 0701 - 06/24 0700   P.O. 700    I.V. (mL/kg) 599.9 (5)    Other 0    IV Piggyback 50    Total Intake(mL/kg) 1349.9 (11.1)    Urine (mL/kg/hr) 950 (0.3)    Emesis/NG output 0    Drains 0    Other 0    Stool 0    Blood 0    Total Output 950    Net +399.9         Urine Occurrence 2 x    Stool Occurrence 1 x    Emesis Occurrence 0 x      LABS  Results for orders placed or performed during the hospital encounter of 07/13/19 (from the past 24 hour(s))  Heparin level (unfractionated)     Status: None   Collection Time: 07/18/19 11:00 AM  Result Value Ref Range   Heparin Unfractionated 0.60 0.30 - 0.70 IU/mL  Glucose, capillary     Status: Abnormal   Collection Time: 07/18/19 11:12 AM  Result Value Ref Range   Glucose-Capillary 140 (H) 70 - 99 mg/dL  Renal function panel     Status: Abnormal    Collection Time: 07/18/19 12:32 PM  Result Value Ref Range   Sodium 135 135 - 145 mmol/L   Potassium 3.1 (L) 3.5 - 5.1 mmol/L   Chloride 96 (L) 98 - 111 mmol/L   CO2 23 22 - 32 mmol/L   Glucose, Bld 147 (H) 70 - 99 mg/dL   BUN 44 (H) 6 - 20 mg/dL   Creatinine, Ser 8.69 (H) 0.61 - 1.24 mg/dL   Calcium 8.8 (L) 8.9 - 10.3 mg/dL   Phosphorus 5.3 (H) 2.5 - 4.6 mg/dL   Albumin 1.5 (L) 3.5 - 5.0 g/dL   GFR calc non Af Amer 7 (L) >60 mL/min   GFR calc Af Amer 8 (L) >60 mL/min   Anion gap 16 (H) 5 - 15  Glucose, capillary     Status: Abnormal   Collection Time: 07/18/19  4:13 PM  Result Value Ref Range   Glucose-Capillary 119 (H) 70 - 99 mg/dL  Glucose, capillary     Status: Abnormal   Collection Time: 07/18/19  9:25 PM  Result Value Ref Range   Glucose-Capillary 144 (H) 70 - 99 mg/dL  Glucose, capillary     Status: Abnormal   Collection Time: 07/19/19 12:32 AM  Result Value Ref Range   Glucose-Capillary 168 (H) 70 - 99 mg/dL  Heparin level (unfractionated)     Status: None   Collection Time: 07/19/19  3:59 AM  Result Value Ref Range   Heparin Unfractionated 0.61 0.30 - 0.70 IU/mL  Renal function panel     Status: Abnormal   Collection Time: 07/19/19  3:59 AM  Result Value Ref Range   Sodium 134 (L) 135 - 145 mmol/L   Potassium 2.9 (L) 3.5 - 5.1 mmol/L   Chloride 94 (L) 98 - 111 mmol/L   CO2 26 22 - 32 mmol/L   Glucose, Bld 176 (H) 70 - 99 mg/dL   BUN 43 (H) 6 - 20 mg/dL   Creatinine, Ser 8.58 (H) 0.61 - 1.24 mg/dL   Calcium 9.0 8.9 - 10.3 mg/dL   Phosphorus 4.6 2.5 - 4.6 mg/dL   Albumin 1.5 (L) 3.5 - 5.0 g/dL   GFR calc non Af Amer 7 (L) >60 mL/min   GFR calc Af Amer 8 (L) >60 mL/min   Anion gap 14 5 - 15  Protime-INR     Status: Abnormal   Collection Time: 07/19/19  3:59 AM  Result Value Ref Range   Prothrombin Time 16.3 (H) 11.4 - 15.2 seconds   INR 1.4 (H) 0.8 - 1.2  Glucose, capillary     Status: Abnormal   Collection Time: 07/19/19  5:56 AM  Result Value Ref Range     Glucose-Capillary 168 (H) 70 - 99 mg/dL     PHYSICAL EXAM:   Gen: resting comfortably in bed, sitting up, NAD, about to work with therapy  Ext:      Left Lower Extremity              prevena functioning well    Good seal              Distal motor and sensory functions intact             Swelling stable  No appreciable knee effusion but body habitus does make exam a little difficult  No new areas of ecchymosis   No DCT   Compartments soft                   Assessment/Plan: 2 Days Post-Op   Principal Problem:   MSSA bacteremia Active Problems:   Peritoneal dialysis status (HCC)   Diabetes mellitus (HCC)   Multiple closed pelvic fractures with disruption of pelvic circle (HCC)   Diabetic peripheral neuropathy (HCC)   Abscess of left hip   Sepsis (HCC)   Chronic anticoagulation   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)   S/P TAVR (transcatheter aortic valve replacement)   Anti-infectives (From admission, onward)   Start     Dose/Rate Route Frequency Ordered Stop   07/17/19 1413  tobramycin (NEBCIN) powder  Status:  Discontinued          As needed 07/17/19 1413 07/17/19 1434   07/17/19 1412  vancomycin (VANCOCIN) powder  Status:  Discontinued          As needed 07/17/19 1413 07/17/19 1434   07/15/19 1700  vancomycin (VANCOREADY) IVPB 1500 mg/300 mL  Status:  Discontinued        1,500 mg 150 mL/hr over 120 Minutes Intravenous Every 48 hours 07/13/19 1726 07/14/19 0845   07/14/19 1700  ceFEPIme (MAXIPIME) 2 g in sodium chloride 0.9 % 100 mL IVPB  Status:  Discontinued        2 g 200 mL/hr over 30 Minutes Intravenous Every 24 hours 07/13/19 1725 07/14/19 0845   07/14/19 1600  ceFAZolin (ANCEF) IVPB 1 g/50 mL premix     Discontinue     1 g 100 mL/hr over 30 Minutes Intravenous Every 24 hours 07/14/19 0859     07/14/19 1200  ceFAZolin (ANCEF) IVPB 2g/100 mL premix  Status:  Discontinued        2 g 200 mL/hr over 30 Minutes Intravenous Every M-W-F (Hemodialysis)  07/14/19 0846 07/14/19 0859   07/14/19 0200  metroNIDAZOLE (FLAGYL) IVPB 500 mg  Status:  Discontinued        500 mg 100 mL/hr over 60 Minutes Intravenous Every 8 hours 07/13/19 2250 07/14/19 0845   07/13/19 1730  ceFEPIme (MAXIPIME) 2 g in sodium chloride 0.9 % 100 mL IVPB        2 g 200 mL/hr over 30 Minutes Intravenous  Once 07/13/19 1715 07/13/19 1818   07/13/19 1730  metroNIDAZOLE (FLAGYL) IVPB 500 mg        500 mg 100 mL/hr over 60 Minutes Intravenous  Once 07/13/19 1715 07/13/19 1849   07/13/19 1730  vancomycin (VANCOCIN) IVPB 1000 mg/200 mL premix  Status:  Discontinued        1,000 mg 200 mL/hr over 60 Minutes Intravenous  Once 07/13/19 1715 07/13/19 1724   07/13/19 1730  vancomycin (VANCOREADY) IVPB 2000 mg/400 mL        2,000 mg 200 mL/hr over 120 Minutes Intravenous  Once 07/13/19 1724 07/13/19 2026    .  POD/HD#: 2  44 y/o male with complex medical history including ESRD on peritoneal dialysis 7 weeks post op ORIF complex L acetabulum fracture dislocation with L thigh abscess    -Left thigh/gluteal abscess s/p serial I&D             prevena incisional dressing in place, will leave on for another 5 days or until dc   No further plans to return to OR this admission for L thigh    - L acetabulum fracture s/p ORIF 7 weeks post op             Partial WB with therapy, 50% BW             Posterior hip precautions              As above               CT shows maintained alignment              Minimal callus around fx zones   - R index finger osteo  Per hand surgery   - Fall  Will check xrays of pelvis and L knee    - Pain management:             Continue with current regimen    - Medical issues              Per medicine and renal    - ID:              Ancef per ID      -  Dispo:             Ortho issues stable     Jari Pigg, PA-C [phone] (C) 07/19/2019, 9:38 AM  [facility] [place] [id number] [phone]  Domingo Sep (F)

## 2019-07-19 NOTE — Progress Notes (Signed)
  [facility] Progress Note   Assessment/ Plan:   1. MSSA bacteremia: in setting of L hip hardware infection and R index finger osteo.  On Cefazolin.  TEE 6/23 no vegetation.  Repeat I and D 6/22  ID on board 2.ESRD continue PD--> ? If he's a long term candidate given volume issues and low albumin.  Continue all 2.5% tonight   3. Anemia: continue ESA, increased Aranesp 200 mcg q Sunday 4. CKD-MBD: binders and renal vitamins 5. Nutrition: needs more protein--> on prostat 6. Hypertension: reasonably well controlled 7.  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  Dispo: pending  Subjective:    For TEE tomorrow, hand surgery to evaluate R finger osteo.     Objective:   BP (!) 159/78   Pulse 87   Temp 97.7 F (36.5 C) (Oral)   Resp 18   Ht 5\' 9"  (1.753 m)   Wt 120.6 kg   SpO2 95%   BMI 39.26 kg/m   Physical Exam: Gen: pale, NAD CVS: RRR Resp: clear Abd:PD cath in place Ext: no LE edema ACCESS: R IJ TDC removed  Labs: BMET Recent Labs  Lab 07/13/19 1405 07/14/19 2048 07/18/19 1232 07/19/19 0359  NA 127* 133* 135 134*  K 3.7 4.0 3.1* 2.9*  CL 87* 95* 96* 94*  CO2 25 22 23 26   GLUCOSE 443* 103* 147* 176*  BUN 32* 46* 44* 43*  CREATININE 8.23* 10.23* 8.69* 8.58*  CALCIUM 8.8* 8.6* 8.8* 9.0  PHOS  --   --  5.3* 4.6   CBC Recent Labs  Lab 07/13/19 1405 07/16/19 0417 07/17/19 0718 07/18/19 0408  WBC 13.2* 13.6* 16.8* 21.9*  NEUTROABS 10.6*  --   --   --   HGB 10.0* 8.0* 8.0* 7.6*  HCT 33.3* 25.8* 26.0* 24.8*  MCV 92.5 90.2 90.0 89.9  PLT 296 264 256 235      Medications:    . atorvastatin  40 mg Oral q1800  . buPROPion  150 mg Oral BID WC  . Chlorhexidine Gluconate Cloth  6 each Topical Daily  . darbepoetin (ARANESP) injection - NON-DIALYSIS  100 mcg Subcutaneous Q Sun-1800  . feeding supplement (PRO-STAT SUGAR FREE 64)  30 mL Oral BID  . gentamicin cream  1 application Topical Daily  . insulin  aspart  0-9 Units Subcutaneous Q6H  . mupirocin ointment   Nasal BID  . pregabalin  50 mg Oral QHS  . sevelamer carbonate  2,400 mg Oral TID WC     Madelon Lips MD 07/19/2019, 2:16 PM

## 2019-07-19 NOTE — Progress Notes (Signed)
*  PRELIMINARY RESULTS* Echocardiogram TEE has been performed.  Max Pittman 07/19/2019, 10:39 AM

## 2019-07-19 NOTE — Progress Notes (Signed)
PHARMACY CONSULT NOTE FOR:  OUTPATIENT  PARENTERAL ANTIBIOTIC THERAPY (OPAT)  Indication: MSSA Bacteremia/Osteomyelitis Regimen: Cefazolin 1 g IV Q24H End date: 08/24/2019  IV antibiotic discharge orders are pended. To discharging provider:  please sign these orders via discharge navigator,  Select New Orders & click on the button choice - Manage This Unsigned Work.     Thank you for allowing pharmacy to be a part of this patient's care.[place], PharmD PGY1 Ambulatory Care Resident

## 2019-07-19 NOTE — Progress Notes (Signed)
Physical Therapy Treatment Patient Details Name: Max Pittman. MRN: [id number] DOB: 1975-07-19 Today's Date: 07/19/2019    History of Present Illness 44 y.o. male with medical history significant for ESRD on peritoneal dialysis, diabetes mellitus, hypertension, DVT. Pt with L hip surgery in April, having persistent pain in this hip since, unable to walk recently due to pain. 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 Pt also hospitalized 6/4-10 for respiratory failure. Pt with L hip fluid collection, undergoing I&D L hip on 6/18. 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    PT Comments    Continuing work on functional mobility and activity tolerance;  His confidence with functional mobility was shaken after recent fall; Still, was able to work on bed mobility, transfers, and take a few steps (all at EOB to reassure him that if his R LE buckles again, he would simply sit down to the bed); Mod assist and incr time to sit upright at EOB, with monitor for Post Hip Prec; Mod assist to power up and steady in standing -- noted he tends to maintain TWB LLE in upright/standing, nervous about putting more weight on LE, but able to take some small sidesteps at EOB;   Noted PT's most recent DC rec is for Home with HHPT follow up; Still, it's worth considering another CIR stay for intensive therapies that will build confidence and maximize independence and safety with mobility prior to dc home; Will continue to monitor progress and update recommendations at appropriate; Placed OT order per protocol to assess ADLs (esp with Post Hip Prec) as well.  Follow Up Recommendations  CIR     Equipment Recommendations  None recommended by PT    Recommendations for Other Services OT consult     Precautions / Restrictions Precautions Precautions: Fall;Posterior Hip Restrictions LLE Weight Bearing: Partial weight  bearing LLE Partial Weight Bearing Percentage or Pounds: 50% PWB in order set, TWB in op note    Mobility  Bed Mobility Overal bed mobility: Needs Assistance Bed Mobility: Supine to Sit;Sit to Supine     Supine to sit: Mod assist Sit to supine: Min assist   General bed mobility comments: Mod handheld assist to pull to sit and for LLE management and to turn hips at edge of bed  Transfers Overall transfer level: Needs assistance Equipment used: Rolling walker (2 wheeled) Transfers: Sit to/from Stand Sit to Stand: Mod assist         General transfer comment: assistance to power up into standing; cues for hand placement, Post Hip and PWB prec; noted moderate dependence on momentum  Ambulation/Gait Ambulation/Gait assistance: Min assist Gait Distance (Feet): 1 Feet (Sidesteps towards HOB; maintained TWB) Assistive device: Rolling walker (2 wheeled) Gait Pattern/deviations: Step-to pattern     General Gait Details: pt with short step to gait, dependent on UE support   Stairs             Wheelchair Mobility    Modified Rankin (Stroke Patients Only)       Balance     Sitting balance-Leahy Scale: Good       Standing balance-Leahy Scale: Poor                              Cognition Arousal/Alertness: Awake/alert Behavior During Therapy: WFL for tasks assessed/performed;Anxious (Nervous about previous fall) Overall Cognitive Status: Within Functional  Limits for tasks assessed                                        Exercises      General Comments        Pertinent Vitals/Pain Pain Assessment: Faces Faces Pain Scale: Hurts whole lot Pain Location: L thigh/hip Pain Descriptors / Indicators: Aching;Grimacing Pain Intervention(s): Monitored during session    Home Living                      Prior Function            PT Goals (current goals can now be found in the care plan section) Acute Rehab PT Goals Patient  Stated Goal: To improve mobility quality and LE strength; wants to boost his confidence with mobility after recent fall PT Goal Formulation: With patient Time For Goal Achievement: 07/30/19 Potential to Achieve Goals: Good Progress towards PT goals: Not progressing toward goals - comment (limited by shaken confidence after fall)    Frequency    Min 3X/week      PT Plan Discharge plan needs to be updated (See discussion in PT comments)    Co-evaluation              AM-PAC PT "6 Clicks" Mobility   Outcome Measure  Help needed turning from your back to your side while in a flat bed without using bedrails?: A Little Help needed moving from lying on your back to sitting on the side of a flat bed without using bedrails?: A Lot Help needed moving to and from a bed to a chair (including a wheelchair)?: A Little Help needed standing up from a chair using your arms (e.g., wheelchair or bedside chair)?: A Lot Help needed to walk in hospital room?: A Lot Help needed climbing 3-5 steps with a railing? : A Lot 6 Click Score: 14    End of Session Equipment Utilized During Treatment: Gait belt Activity Tolerance: Patient tolerated treatment well Patient left: in bed;with call bell/phone within reach (Preparing to go to TEE) Nurse Communication: Mobility status PT Visit Diagnosis: Pain;Other (comment);Muscle weakness (generalized) (M62.81);Difficulty in walking, not elsewhere classified (R26.2) Pain - Right/Left: Left Pain - part of body: Leg     Time: 8101-7510 PT Time Calculation (min) (ACUTE ONLY): 30 min  Charges:  $Gait Training: 8-22 mins $Therapeutic Activity: 8-22 mins                     Roney Marion, PT  Acute Rehabilitation Services Pager [phone] Office [place] 07/19/2019, 9:49 AM

## 2019-07-19 NOTE — Progress Notes (Signed)
[place] NOTE   Pharmacy Consult for Heparin Indication: DVT  Allergies  Allergen Reactions   Doxycycline Hives   Latex Swelling    Pt reports swelling at site.    Morphine And Related Anxiety    Patient Measurements: Height: 5\' 9"  (175.3 cm) Weight: 120.6 kg (265 lb 14 oz) IBW/kg (Calculated) : 70.7 Heparin Dosing Weight: 96.7 kg  Vital Signs: Temp: 97.5 F (36.4 C) (06/23 0731) Temp Source: Oral (06/23 0731) BP: 119/77 (06/23 0731) Pulse Rate: 81 (06/23 0428)  Labs: Recent Labs    07/17/19 0718 07/17/19 2216 07/18/19 0408 07/18/19 1100 07/18/19 1232 07/19/19 0359  HGB 8.0*  --  7.6*  --   --   --   HCT 26.0*  --  24.8*  --   --   --   PLT 256  --  235  --   --   --   LABPROT 16.0*  --  16.7*  --   --  16.3*  INR 1.3*  --  1.4*  --   --  1.4*  HEPARINUNFRC 0.52   < > 0.51 0.60  --  0.61  CREATININE  --   --   --   --  8.69* 8.58*   < > = values in this interval not displayed.    Estimated Creatinine Clearance: 14.2 mL/min (A) (by C-G formula based on SCr of 8.58 mg/dL (H)).   Medical History: Past Medical History:  Diagnosis Date   Arthritis    Closed dislocation of left hip ([place]) 05/21/2019   Diabetes ([place])    Diabetes mellitus without complication ([place])    History of peritoneal dialysis    Hypertension    Renal disorder    FFGS   Renal disorder    Stroke ([place])    when ha was a child   Vasovagal syndrome    with syncope    Medications:  Medications Prior to Admission  Medication Sig Dispense Refill Last Dose   acetaminophen (TYLENOL) 325 MG tablet Take 2 tablets (650 mg total) by mouth every 8 (eight) hours.   07/12/2019 at Unknown time   amLODipine (NORVASC) 5 MG tablet Take 1 tablet (5 mg total) by mouth daily. 30 tablet 0 07/12/2019 at Unknown time   aspirin EC 81 MG EC tablet Take 1 tablet (81 mg total) by mouth daily.   07/12/2019 at Unknown time   atorvastatin (LIPITOR) 40 MG tablet Take 1 tablet (40 mg  total) by mouth daily at 6 PM. 31 tablet 6 07/12/2019 at Unknown time   buPROPion (WELLBUTRIN SR) 150 MG 12 hr tablet Take 1 tablet (150 mg total) by mouth 2 (two) times daily with a meal. 60 tablet 0 07/12/2019 at Unknown time   cholecalciferol (VITAMIN D) 25 MCG tablet Take 2 tablets (2,000 Units total) by mouth 2 (two) times daily. 60 tablet 0 07/12/2019 at Unknown time   docusate sodium (COLACE) 100 MG capsule Take 300 mg by mouth daily as needed for mild constipation or moderate constipation. As directed    Past Month at Unknown time   HYDROcodone-acetaminophen (NORCO/VICODIN) 5-325 MG tablet Take 1 tablet by mouth every 8 (eight) hours as needed.   Past Week at Unknown time   loratadine (CLARITIN) 10 MG tablet Take 1 tablet (10 mg total) by mouth daily. 30 tablet 0 07/12/2019 at Unknown time   methocarbamol (ROBAXIN) 500 MG tablet Take 2 tablets (1,000 mg total) by mouth 3 (three) times daily. 90 tablet 0 07/11/2019  at Unknown time   metoprolol tartrate (LOPRESSOR) 25 MG tablet Take 0.5 tablets (12.5 mg total) by mouth 2 (two) times daily. 30 tablet 0 07/12/2019 at 2000   omeprazole (PRILOSEC) 20 MG capsule Take 1 capsule (20 mg total) by mouth daily. 30 capsule 0 07/12/2019 at Unknown time   pioglitazone (ACTOS) 15 MG tablet Take 1 tablet (15 mg total) by mouth daily. 30 tablet 0 07/12/2019 at Unknown time   pregabalin (LYRICA) 50 MG capsule Take 1 capsule (50 mg total) by mouth at bedtime. 30 capsule 0 07/12/2019 at Unknown time   sevelamer carbonate (RENVELA) 800 MG tablet Take 3 tablets (2,400 mg total) by mouth 3 (three) times daily with meals. 240 tablet 1 07/12/2019 at Unknown time   warfarin (COUMADIN) 2 MG tablet Take 1 tablet (2 mg total) by mouth daily. Or as directed by [facility] (Patient taking differently: Take 2 mg by mouth every evening. ) 30 tablet 4 07/12/2019 at 2000   oxyCODONE (OXYCONTIN) 10 mg 12 hr tablet Take 1 tablet (10 mg total) by mouth daily. (Patient not  taking: Reported on 07/13/2019) 14 tablet 0 Not Taking at Unknown time    Assessment: Patient is post op day 1 with wound vac and peritoneal dialysis.  On warfarin for DVT which was stopped for surgery.  Received phytonadione 5 mg IV 07/14/19 0037.  Heparin infusion per pharmacy consult ordered with plans for Orthopedic Surgery to dictate when to start warfarin.   D/W MD, lower heparin goal is not needed, therefore will increase goal to 0.3-0.7   Heparin level this morning 0.61.  Hgb 8>>7.6, PLTC stable, no bleeding noted. Patient will likely have finger amputation in the next two weeks and will require CVC for long-term antibiotics per note.    Goal of Therapy:  Heparin Level goal = 0.3-0.7 Monitor platelets by anticoagulation protocol: Yes   Plan:  Continue IV heparin at 2000 units/hr Heparin level daily f/u restart warfarin   Zalman Hull A. Levada Dy, PharmD, BCPS, FNKF Clinical Pharmacist [place] Please utilize Amion for appropriate phone number to reach the unit pharmacist ([place])

## 2019-07-19 NOTE — CV Procedure (Addendum)
    PROCEDURE NOTE:  Procedure:  Transesophageal echocardiogram Operator:  Fransico Him, MD Indications:  bacteremia Complications: None  During this procedure the patient is administered a total of Propofol 180 mg to achieve and maintain moderate conscious sedation.  The patient's heart rate, blood pressure, and oxygen saturation are monitored continuously during the procedure by anesthesia.   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  The patient tolerated the procedure well and was transferred back to their room in stable condition.  Signed: Fransico Him, MD [facility]

## 2019-07-19 NOTE — Interval H&P Note (Signed)
History and Physical Interval Note:  07/19/2019 9:47 AM  Max Pittman.  has presented today for surgery, with the diagnosis of BACTEREMIA.  The various methods of treatment have been discussed with the patient and family. After consideration of risks, benefits and other options for treatment, the patient has consented to  Procedure(s): TRANSESOPHAGEAL ECHOCARDIOGRAM (TEE) (N/A) as a surgical intervention.  The patient's history has been reviewed, patient examined, no change in status, stable for surgery.  I have reviewed the patient's chart and labs.  Questions were answered to the patient's satisfaction.     Fransico Him
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
## 2019-07-19 NOTE — Progress Notes (Signed)
Inpatient Rehab Admissions Coordinator Note:   Per PT recommendations, pt was screened for CIR candidacy by Shann Medal, PT, DPT.  At this time we are recommending a CIR consult.  I will place an order per our protocol.  Please contact me with questions.   Shann Medal, PT, DPT [phone] 07/19/19 1:48 PM

## 2019-07-19 NOTE — Transfer of Care (Signed)
Immediate Anesthesia Transfer of Care Note  Patient: Max Pittman.  Procedure(s) Performed: TRANSESOPHAGEAL ECHOCARDIOGRAM (TEE) (N/A )  Patient Location: Endoscopy Unit  Anesthesia Type:MAC  Level of Consciousness: drowsy  Airway & Oxygen Therapy: Patient Spontanous Breathing and Patient connected to nasal cannula oxygen  Post-op Assessment: Report given to RN and Post -op Vital signs reviewed and stable  Post vital signs: Reviewed and stable  Last Vitals:  Vitals Value Taken Time  BP    Temp    Pulse    Resp    SpO2      Last Pain:  Vitals:   07/19/19 0946  TempSrc: Oral  PainSc: 9       Patients Stated Pain Goal: 0 (43/60/67 7034)  Complications: No complications documented.

## 2019-07-19 NOTE — Plan of Care (Signed)
  Problem: Activity: Goal: Risk for activity intolerance will decrease Outcome: Progressing   Problem: Safety: Goal: Ability to remain free from injury will improve Outcome: Progressing   

## 2019-07-20 ENCOUNTER — Encounter (HOSPITAL_COMMUNITY): Payer: Self-pay | Admitting: Cardiology

## 2019-07-20 DIAGNOSIS — M869 Osteomyelitis, unspecified: Secondary | ICD-10-CM

## 2019-07-20 LAB — PROTIME-INR
INR: 1.4 — ABNORMAL HIGH (ref 0.8–1.2)
Prothrombin Time: 16.4 seconds — ABNORMAL HIGH (ref 11.4–15.2)

## 2019-07-20 LAB — CULTURE, BLOOD (ROUTINE X 2)
Culture: NO GROWTH
Culture: NO GROWTH
Special Requests: ADEQUATE

## 2019-07-20 LAB — RENAL FUNCTION PANEL
Albumin: 1.6 g/dL — ABNORMAL LOW (ref 3.5–5.0)
Anion gap: 14 (ref 5–15)
BUN: 49 mg/dL — ABNORMAL HIGH (ref 6–20)
CO2: 22 mmol/L (ref 22–32)
Calcium: 8.7 mg/dL — ABNORMAL LOW (ref 8.9–10.3)
Chloride: 99 mmol/L (ref 98–111)
Creatinine, Ser: 8.88 mg/dL — ABNORMAL HIGH (ref 0.61–1.24)
GFR calc Af Amer: 8 mL/min — ABNORMAL LOW (ref >60)
GFR calc non Af Amer: 7 mL/min — ABNORMAL LOW (ref >60)
Glucose, Bld: 95 mg/dL (ref 70–99)
Phosphorus: 4.3 mg/dL (ref 2.5–4.6)
Potassium: 2.9 mmol/L — ABNORMAL LOW (ref 3.5–5.1)
Sodium: 135 mmol/L (ref 135–145)

## 2019-07-20 LAB — GLUCOSE, CAPILLARY
Glucose-Capillary: 135 mg/dL — ABNORMAL HIGH (ref 70–99)
Glucose-Capillary: 188 mg/dL — ABNORMAL HIGH (ref 70–99)
Glucose-Capillary: 152 mg/dL — ABNORMAL HIGH (ref 70–99)

## 2019-07-20 LAB — PHOSPHORUS: Phosphorus: 3.9 mg/dL (ref 2.5–4.6)

## 2019-07-20 LAB — MAGNESIUM: Magnesium: 1.6 mg/dL — ABNORMAL LOW (ref 1.7–2.4)

## 2019-07-20 LAB — HEPARIN LEVEL (UNFRACTIONATED): Heparin Unfractionated: 0.49 IU/mL (ref 0.30–0.70)

## 2019-07-20 MED ORDER — PREDNISONE 20 MG PO TABS
20.0000 mg | ORAL_TABLET | Freq: Two times a day (BID) | ORAL | Status: AC
  Administered 2019-07-20 – 2019-07-22 (×4): 20 mg via ORAL
  Filled 2019-07-20 (×4): qty 1

## 2019-07-20 MED ORDER — PREDNISONE 10 MG PO TABS
10.0000 mg | ORAL_TABLET | Freq: Three times a day (TID) | ORAL | Status: AC
  Administered 2019-07-23 – 2019-07-24 (×5): 10 mg via ORAL
  Filled 2019-07-20 (×4): qty 1

## 2019-07-20 MED ORDER — PREDNISONE 10 MG PO TABS
10.0000 mg | ORAL_TABLET | Freq: Two times a day (BID) | ORAL | Status: AC
  Administered 2019-07-24 – 2019-07-26 (×5): 10 mg via ORAL
  Filled 2019-07-20 (×4): qty 1

## 2019-07-20 MED ORDER — POTASSIUM CHLORIDE CRYS ER 20 MEQ PO TBCR
50.0000 meq | EXTENDED_RELEASE_TABLET | Freq: Two times a day (BID) | ORAL | Status: AC
  Administered 2019-07-20 (×2): 50 meq via ORAL
  Filled 2019-07-20 (×2): qty 1

## 2019-07-20 MED ORDER — PREDNISONE 10 MG PO TABS
10.0000 mg | ORAL_TABLET | Freq: Every day | ORAL | Status: AC
  Administered 2019-07-27 – 2019-07-28 (×2): 10 mg via ORAL
  Filled 2019-07-20 (×3): qty 1

## 2019-07-20 NOTE — Progress Notes (Signed)
Spoke with Hemodialysis RN. Will come up to restart treatment.

## 2019-07-20 NOTE — Progress Notes (Signed)
  [facility] Progress Note   Assessment/ Plan:   1. MSSA bacteremia: in setting of L hip hardware infection and R index finger osteo.  On Cefazolin.  TEE 6/23 no vegetation.  Repeat I and D 6/22  ID on board 2.ESRD continue PD--> Continue all 2.5% tonight. 3. Anemia: continue ESA, increased Aranesp 200 mcg q Sunday 4. CKD-MBD: binders and renal vitamins 5. Nutrition: needs more protein--> on prostat 6. Hypertension: reasonably well controlled, may need to add 4.25% if remains a little elevated 7.  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  Dispo: pending  Subjective:    Seen in room.  Reports a little lower abd pain on palpation but otherwise OK.  PD machine malfunctioned last night, now just getting started   Objective:   BP (!) 145/74 (BP Location: Right Arm)   Pulse 88   Temp 97.7 F (36.5 C) (Oral)   Resp 16   Ht 5\' 9"  (1.753 m)   Wt 121.5 kg   SpO2 93%   BMI 39.56 kg/m   Physical Exam: Gen: pale, NAD CVS: RRR Resp: clear Abd:PD cath in place Ext: no LE edema ACCESS: R IJ TDC removed MSK: L hip woundvac +  Labs: BMET Recent Labs  Lab 07/13/19 1405 07/14/19 2048 07/18/19 1232 07/19/19 0359 07/20/19 0407 07/20/19 0819  NA 127* 133* 135 134* 135  --   K 3.7 4.0 3.1* 2.9* 2.9*  --   CL 87* 95* 96* 94* 99  --   CO2 25 22 23 26 22   --   GLUCOSE 443* 103* 147* 176* 95  --   BUN 32* 46* 44* 43* 49*  --   CREATININE 8.23* 10.23* 8.69* 8.58* 8.88*  --   CALCIUM 8.8* 8.6* 8.8* 9.0 8.7*  --   PHOS  --   --  5.3* 4.6 4.3 3.9   CBC Recent Labs  Lab 07/13/19 1405 07/16/19 0417 07/17/19 0718 07/18/19 0408  WBC 13.2* 13.6* 16.8* 21.9*  NEUTROABS 10.6*  --   --   --   HGB 10.0* 8.0* 8.0* 7.6*  HCT 33.3* 25.8* 26.0* 24.8*  MCV 92.5 90.2 90.0 89.9  PLT 296 264 256 235      Medications:    . amLODipine  5 mg Oral Daily  . atorvastatin  40 mg Oral q1800  . buPROPion  150 mg Oral BID WC  .  Chlorhexidine Gluconate Cloth  6 each Topical Daily  . [START ON 07/23/2019] darbepoetin (ARANESP) injection - NON-DIALYSIS  200 mcg Subcutaneous Q Sun-1800  . feeding supplement (PRO-STAT SUGAR FREE 64)  30 mL Oral BID  . gentamicin cream  1 application Topical Daily  . insulin aspart  0-9 Units Subcutaneous Q6H  . metoprolol tartrate  12.5 mg Oral BID  . mupirocin ointment   Nasal BID  . potassium chloride  50 mEq Oral BID  . pregabalin  50 mg Oral QHS  . sevelamer carbonate  2,400 mg Oral TID WC     Madelon Lips MD 07/20/2019, 10:13 AM

## 2019-07-20 NOTE — Plan of Care (Signed)
  Problem: Pain Managment: Goal: General experience of comfort will improve Outcome: Completed/Met

## 2019-07-20 NOTE — Progress Notes (Signed)
Renal Navigator gave update to patient's PD RN to per her request to provide continuity of care.   Alphonzo Cruise, [place] Renal Navigator [phone]
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
## 2019-07-20 NOTE — Progress Notes (Signed)
Pt's PD treatment  restarted d/t cycler out of order.

## 2019-07-20 NOTE — Progress Notes (Signed)
ANTICOAGULATION CONSULT NOTE - Follow Up Consult  Pharmacy Consult for Heparin Indication: h/o DVT, recent CVA  Allergies  Allergen Reactions  . Doxycycline Hives  . Latex Swelling    Pt reports swelling at site.   . Morphine And Related Anxiety    Patient Measurements: Height: 5\' 9"  (175.3 cm) Weight: 121.5 kg (267 lb 13.7 oz) IBW/kg (Calculated) : 70.7 Heparin Dosing Weight:    Vital Signs: Temp: 97.9 F (36.6 C) (06/24 0536) Temp Source: Oral (06/24 0536) BP: 134/89 (06/24 0536) Pulse Rate: 84 (06/24 0536)  Labs: Recent Labs    07/18/19 0408 07/18/19 0408 07/18/19 1100 07/18/19 1232 07/19/19 0359 07/20/19 0407  HGB 7.6*  --   --   --   --   --   HCT 24.8*  --   --   --   --   --   PLT 235  --   --   --   --   --   LABPROT 16.7*  --   --   --  16.3* 16.4*  INR 1.4*  --   --   --  1.4* 1.4*  HEPARINUNFRC 0.51   < > 0.60  --  0.61 0.49  CREATININE  --   --   --  8.69* 8.58* 8.88*   < > = values in this interval not displayed.    Estimated Creatinine Clearance: 13.8 mL/min (A) (by C-G formula based on SCr of 8.88 mg/dL (H)).   Assessment: Anticoag: PTA warfarin for hx DVT. 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 HL 0.49 in goal. INR still 1.4. CBC to be drawn.Orthopedic surgery to dictate when to start warfarin--patient to have finger amputation 6/25 - s/p Vit K 5 mg IV x 1 on 6/18 0037  Goal of Therapy:  Heparin level 0.3-0.7 units/ml Monitor platelets by anticoagulation protocol: Yes   Plan:  Heparin at 2000 units/hr Finger amputation 6/25, then f/u to resume Coumadin Daily HL and CBC   Ashawna Hanback S. Alford Highland, PharmD, BCPS Clinical Staff Pharmacist [email or web address] Alford Highland, Kenlee Maler Stillinger 07/20/2019,8:40 AM

## 2019-07-20 NOTE — Progress Notes (Signed)
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 Subcutaneous Q Sun-1800  . feeding supplement (PRO-STAT SUGAR FREE 64)  30 mL Oral BID  . gentamicin cream  1 application Topical Daily  . insulin aspart  0-9 Units Subcutaneous Q6H  . metoprolol tartrate  12.5 mg Oral BID  . mupirocin ointment   Nasal BID  . pregabalin  50 mg Oral QHS  . sevelamer carbonate  2,400 mg Oral TID WC   Continuous Infusions: . sodium chloride 10 mL/hr at 07/14/19 1721  . sodium chloride    .  ceFAZolin (ANCEF) IV Stopped (07/19/19 1744)  . dialysis solution 1.5% low-MG/low-CA    . dialysis solution 2.5% low-MG/low-CA    . heparin 2,000 Units/hr (07/20/19 0733)     LOS: 7 days    Time spent:40 min    Jackilyn Umphlett, Geraldo Docker, MD [facility] Pager [phone]  If 7PM-7AM, please contact night-coverage [email or web address] Password [facility] 07/20/2019, 8:09 AM

## 2019-07-20 NOTE — Plan of Care (Signed)
  Problem: Clinical Measurements: Goal: Ability to maintain clinical measurements within normal limits will improve Outcome: Progressing   Problem: Activity: Goal: Risk for activity intolerance will decrease Outcome: Progressing   Problem: Safety: Goal: Ability to remain free from injury will improve Outcome: Progressing   

## 2019-07-20 NOTE — Progress Notes (Signed)
Inpatient Rehabilitation-Admissions Coordinator   CIR consult received. Met with pt bedside for CIR assessment. I am familiar with this patient as he was recently with [place] in [place] from 5/7-5/22. Pt DC'd from CIR with limited gait (5-10 feet) at Supervision/Min A level and Min A for ADLs. Mobility wise the patient appears close to his DC level from CIR but does appear to have increased ADL assist needs at this time (will need clarification if this is due to pain, weakness, or lack of AE use). Spoke with pt about assistance at home. He feels his girlfriend can assist him at his current level and he would prefer to go home once medically ready if possible. Discussed that I will continue to follow him for functional needs and we can reassess once he is medically ready for next venue of care.   Raechel Ache, OTR/L  Rehab Admissions Coordinator  [phone] 07/20/2019 4:22 PM

## 2019-07-20 NOTE — Progress Notes (Addendum)
[facility] for Infectious Disease  Date of Admission:  07/13/2019      Total days of antibiotics 8  Day 7 cefazolin           ASSESSMENT: Max Pittman. is a 44 y.o. male with MSSA bacteremia complicated by infected left hip hardware with abscess and chronic indwelling HD line. He has cleared his blood cultures.  His TEE report indicates no prosthetic valve or native valve endocarditis.    He is afebrile but still complaining of constant throbbing pain to the left knee that is affecting ROM. This may be due to the fall from a few nights ago on top of chronic issues, however he is not getting any relief with oral pain regimen. Given he came in with MSSA bacteremia I still worry that his knee may be infected as well. Xray was normal and really his exam is pretty benign other than the decreased flexion - may need to consider CT scan vs attempt to aspirate for cell count. Will discuss with ortho team for recommendations.   Scheduled for amputation right pointer finger tomorrow.      PLAN: 1. Continue Cefazolin - previous OPAT orders still pertain 2. OK to place PICC line from ID perspective 3. 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 If no better in Am will consider arthrocentesis. Would send for cell count and culture (if little fluid, the cell count would be more helpful given he has been on antibiotics over a week). Dr. Sherral Hammers updated as well.    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)   Peritoneal dialysis status ([place])   Diabetes mellitus ([place])   Multiple closed pelvic fractures with disruption of pelvic circle (HCC)   Diabetic peripheral neuropathy (HCC)   Sepsis (HCC)   Chronic anticoagulation   S/P TAVR (transcatheter aortic valve  replacement)   . amLODipine  5 mg Oral Daily  . atorvastatin  40 mg Oral q1800  . buPROPion  150 mg Oral BID WC  . Chlorhexidine Gluconate Cloth  6 each Topical Daily  . [START ON 07/23/2019] darbepoetin (ARANESP) injection - NON-DIALYSIS  200 mcg Subcutaneous Q Sun-1800  . feeding supplement (PRO-STAT SUGAR FREE 64)  30 mL Oral BID  . gentamicin cream  1 application Topical Daily  . insulin aspart  0-9 Units Subcutaneous Q6H  . metoprolol tartrate  12.5 mg Oral BID  . mupirocin ointment   Nasal BID  . potassium chloride  50 mEq Oral BID  . pregabalin  50 mg Oral QHS  . sevelamer carbonate  2,400 mg Oral TID WC    SUBJECTIVE: Having constant throbbing achy left knee pain that keeps him awake at night.  Xray is negative for acute fracture/dislocation or effusion.  He can straighten it completely but cannot flex the joint to 90 deg. Unable to bear any weight on this leg.   Afebrile    Review of Systems: Review of Systems  Constitutional: Negative for chills, fever and malaise/fatigue.  Respiratory: Negative for cough and shortness of breath.   Cardiovascular: Positive for leg swelling (L hip). Negative for chest pain.  Gastrointestinal: Negative for abdominal pain, diarrhea, nausea and vomiting.  Musculoskeletal: Positive for joint pain.  Left leg pain   Neurological: Negative for dizziness and weakness.    Allergies  Allergen Reactions  . Doxycycline Hives  . Latex Swelling    Pt reports swelling at site.   . Morphine And Related Anxiety    OBJECTIVE: Vitals:   07/19/19 1854 07/19/19 2132 07/20/19 0536 07/20/19 0920  BP: 126/80 128/75 134/89 140/80  Pulse: 88 92 84 86  Resp: 17 18 18 18   Temp: 97.8 F (36.6 C) 97.6 F (36.4 C) 97.9 F (36.6 C)   TempSrc:  Oral Oral   SpO2: 95% 96% 97% 92%  Weight:  121.5 kg    Height:       Body mass index is 39.56 kg/m.  Physical Exam Constitutional:      Appearance: Normal appearance.     Comments: Resting  comfortably in bed.   HENT:     Mouth/Throat:     Mouth: Mucous membranes are moist.  Eyes:     General: No scleral icterus. Cardiovascular:     Rate and Rhythm: Normal rate and regular rhythm.     Heart sounds: No murmur heard.   Pulmonary:     Effort: Pulmonary effort is normal.     Breath sounds: Normal breath sounds.  Chest:     Comments: HD line removed - clean dry dressing Abdominal:     General: Bowel sounds are normal. There is no distension.  Musculoskeletal:     Comments: L hip with clean sponge in place.  L knee is normal appearing, no redness or TTP. Can fully extend to ~180 degrees but cannot achieve beyond 90 degrees for flexion. No obvious signs of joint effusion on exam. No patellar tenderness with manipulation.  R distal pointer finger remains swollen and red with chronic ulceration. No drainage.   Skin:    General: Skin is warm and dry.     Capillary Refill: Capillary refill takes less than 2 seconds.  Neurological:     Mental Status: He is alert and oriented to person, place, and time.     Lab Results Lab Results  Component Value Date   WBC 21.9 (H) 07/18/2019   HGB 7.6 (L) 07/18/2019   HCT 24.8 (L) 07/18/2019   MCV 89.9 07/18/2019   PLT 235 07/18/2019    Lab Results  Component Value Date   CREATININE 8.88 (H) 07/20/2019   BUN 49 (H) 07/20/2019   NA 135 07/20/2019   K 2.9 (L) 07/20/2019   CL 99 07/20/2019   CO2 22 07/20/2019    Lab Results  Component Value Date   ALT 11 07/13/2019   AST 14 (L) 07/13/2019   ALKPHOS 122 07/13/2019   BILITOT 0.6 07/13/2019     Microbiology: Recent Results (from the past 240 hour(s))  Blood culture (routine x 2)     Status: Abnormal   Collection Time: 07/13/19  2:06 PM   Specimen: BLOOD RIGHT ARM  Result Value Ref Range Status   Specimen Description   Final    BLOOD RIGHT ARM Performed at [facility], [address], [place], [place] [address]    Special Requests   Final    BOTTLES DRAWN AEROBIC AND  ANAEROBIC Blood Culture adequate volume Performed at [facility], [address], [place], [place] [address]    Culture  Setup Time   Final    IN BOTH AEROBIC AND ANAEROBIC BOTTLES GRAM POSITIVE COCCI Gram Stain Report Called to,Read Back By and Verified With: C MARSHALL,RN @0311  07/14/19 MKELLY CRITICAL  RESULT CALLED TO, READ BACK BY AND VERIFIED WITH: Roxy Manns  [id number] FCP Performed at [facility], [place] [address] [place], [place] [address]    Culture STAPHYLOCOCCUS AUREUS (A)  Final   Report Status 07/16/2019 FINAL  Final   Organism ID, Bacteria STAPHYLOCOCCUS AUREUS  Final      Susceptibility   Staphylococcus aureus - MIC*    CIPROFLOXACIN <=0.5 SENSITIVE Sensitive     ERYTHROMYCIN <=0.25 SENSITIVE Sensitive     GENTAMICIN <=0.5 SENSITIVE Sensitive     OXACILLIN 0.5 SENSITIVE Sensitive     TETRACYCLINE <=1 SENSITIVE Sensitive     VANCOMYCIN <=0.5 SENSITIVE Sensitive     TRIMETH/SULFA <=10 SENSITIVE Sensitive     CLINDAMYCIN <=0.25 SENSITIVE Sensitive     RIFAMPIN <=0.5 SENSITIVE Sensitive     Inducible Clindamycin NEGATIVE Sensitive     * STAPHYLOCOCCUS AUREUS  Blood Culture ID Panel (Reflexed)     Status: Abnormal   Collection Time: 07/13/19  2:06 PM  Result Value Ref Range Status   Enterococcus species NOT DETECTED NOT DETECTED Final   Listeria monocytogenes NOT DETECTED NOT DETECTED Final   Staphylococcus species DETECTED (A) NOT DETECTED Final    Comment: CRITICAL RESULT CALLED TO, READ BACK BY AND VERIFIED WITH: PHARMD D HALEY D  [id number] FCP    Staphylococcus aureus (BCID) DETECTED (A) NOT DETECTED Final    Comment: Methicillin (oxacillin) susceptible Staphylococcus aureus (MSSA). Preferred therapy is anti staphylococcal beta lactam antibiotic (Cefazolin or Nafcillin), unless clinically contraindicated. CRITICAL RESULT CALLED TO, READ BACK BY AND VERIFIED WITH: Roxy Manns  [id number] FCP    Methicillin resistance NOT DETECTED NOT DETECTED  Final   Streptococcus species NOT DETECTED NOT DETECTED Final   Streptococcus agalactiae NOT DETECTED NOT DETECTED Final   Streptococcus pneumoniae NOT DETECTED NOT DETECTED Final   Streptococcus pyogenes NOT DETECTED NOT DETECTED Final   Acinetobacter baumannii NOT DETECTED NOT DETECTED Final   Enterobacteriaceae species NOT DETECTED NOT DETECTED Final   Enterobacter cloacae complex NOT DETECTED NOT DETECTED Final   Escherichia coli NOT DETECTED NOT DETECTED Final   Klebsiella oxytoca NOT DETECTED NOT DETECTED Final   Klebsiella pneumoniae NOT DETECTED NOT DETECTED Final   Proteus species NOT DETECTED NOT DETECTED Final   Serratia marcescens NOT DETECTED NOT DETECTED Final   Haemophilus influenzae NOT DETECTED NOT DETECTED Final   Neisseria meningitidis NOT DETECTED NOT DETECTED Final   Pseudomonas aeruginosa NOT DETECTED NOT DETECTED Final   Candida albicans NOT DETECTED NOT DETECTED Final   Candida glabrata NOT DETECTED NOT DETECTED Final   Candida krusei NOT DETECTED NOT DETECTED Final   Candida parapsilosis NOT DETECTED NOT DETECTED Final   Candida tropicalis NOT DETECTED NOT DETECTED Final    Comment: Performed at [facility], [address] [name], [place] [address]  Blood culture (routine x 2)     Status: Abnormal   Collection Time: 07/13/19  3:02 PM   Specimen: BLOOD RIGHT ARM  Result Value Ref Range Status   Specimen Description   Final    BLOOD RIGHT ARM Performed at [facility], [address] [place], [place] [address]    Special Requests   Final    BOTTLES DRAWN AEROBIC AND ANAEROBIC Blood Culture adequate volume Performed at [facility], [address], [place], [place] [address]    Culture  Setup Time   Final    AEROBIC BOTTLE ONLY GRAM POSITIVE COCCI Gram Stain Report  Called to,Read Back By and Verified With: C MARSHALL,RN @0439  07/14/19 MKELLY GRAM POSITIVE COCCI ANAEROBIC BOTTLE ONLY RESULT PREV. CALLED  Performed at [facility], [address], [place], [place] [address]    Culture (A)  Final    STAPHYLOCOCCUS AUREUS SUSCEPTIBILITIES PERFORMED ON PREVIOUS CULTURE WITHIN THE LAST 5 DAYS. Performed at [facility], [place] [address], [place], [place] [address]    Report Status 07/16/2019 FINAL  Final  SARS Coronavirus 2 by RT PCR (hospital order, performed in [facility] hospital lab) Nasopharyngeal Nasopharyngeal Swab     Status: None   Collection Time: 07/13/19  6:10 PM   Specimen: Nasopharyngeal Swab  Result Value Ref Range Status   SARS Coronavirus 2 NEGATIVE NEGATIVE Final    Comment: (NOTE) SARS-CoV-2 target nucleic acids are NOT DETECTED.  The SARS-CoV-2 RNA is generally detectable in upper and lower respiratory specimens during the acute phase of infection. The lowest concentration of SARS-CoV-2 viral copies this assay can detect is 250 copies / mL. A negative result does not preclude SARS-CoV-2 infection and should not be used as the sole basis for treatment or other patient management decisions.  A negative result may occur with improper specimen collection / handling, submission of specimen other than nasopharyngeal swab, presence of viral mutation(s) within the areas targeted by this assay, and inadequate number of viral copies (<250 copies / mL). A negative result must be combined with clinical observations, patient history, and epidemiological information.  Fact Sheet for Patients:   StrictlyIdeas.no  Fact Sheet for Healthcare Providers: BankingDealers.co.za  This test is not yet approved or  cleared by the [place] FDA and has been authorized for detection and/or diagnosis of SARS-CoV-2 by FDA under an Emergency Use Authorization (EUA).  This EUA will remain in effect (meaning this test can be used) for the duration of the COVID-19 declaration under Section 564(b)(1) of the Act, 21 U.S.C. section 360bbb-3(b)(1), unless the authorization is terminated  or revoked sooner.  Performed at [facility], [address], [place], [place] [address]   Aerobic/Anaerobic Culture (surgical/deep wound)     Status: None   Collection Time: 07/14/19  3:28 PM   Specimen: PATH Other; Tissue  Result Value Ref Range Status   Specimen Description WOUND LEFT HIP INCISION  Final   Special Requests NONE  Final   Gram Stain   Final    ABUNDANT WBC PRESENT, PREDOMINANTLY PMN FEW GRAM POSITIVE COCCI    Culture   Final    FEW STAPHYLOCOCCUS AUREUS NO ANAEROBES ISOLATED Performed at [facility], [place] [address] [place], [place] [address]    Report Status 07/19/2019 FINAL  Final   Organism ID, Bacteria STAPHYLOCOCCUS AUREUS  Final      Susceptibility   Staphylococcus aureus - MIC*    CIPROFLOXACIN <=0.5 SENSITIVE Sensitive     ERYTHROMYCIN <=0.25 SENSITIVE Sensitive     GENTAMICIN <=0.5 SENSITIVE Sensitive     OXACILLIN 0.5 SENSITIVE Sensitive     TETRACYCLINE <=1 SENSITIVE Sensitive     VANCOMYCIN <=0.5 SENSITIVE Sensitive     TRIMETH/SULFA <=10 SENSITIVE Sensitive     CLINDAMYCIN <=0.25 SENSITIVE Sensitive     RIFAMPIN <=0.5 SENSITIVE Sensitive     Inducible Clindamycin NEGATIVE Sensitive     * FEW STAPHYLOCOCCUS AUREUS  Culture, blood (Routine X 2) w Reflex to ID Panel     Status: None (Preliminary result)   Collection Time: 07/15/19  7:24 AM   Specimen: BLOOD RIGHT ARM  Result Value Ref Range Status   Specimen Description BLOOD RIGHT ARM  Final   Special Requests   Final    AEROBIC BOTTLE ONLY Blood Culture results may not be optimal due to an inadequate volume of blood received in culture bottles   Culture   Final    NO GROWTH 4 DAYS Performed at [place] [address], [place], [place] [address]    Report Status PENDING  Incomplete  Culture, blood (Routine X 2) w Reflex to ID Panel     Status: None (Preliminary result)   Collection Time: 07/15/19  7:33 AM   Specimen: BLOOD RIGHT HAND  Result Value Ref Range Status    Specimen Description BLOOD RIGHT HAND  Final   Special Requests   Final    BOTTLES DRAWN AEROBIC AND ANAEROBIC Blood Culture adequate volume   Culture   Final    NO GROWTH 4 DAYS Performed at [facility], [place] [address], [place], [place] [address]    Report Status PENDING  Incomplete  Surgical pcr screen     Status: Abnormal   Collection Time: 07/17/19  7:49 AM   Specimen: Nasal Mucosa; Nasal Swab  Result Value Ref Range Status   MRSA, PCR NEGATIVE NEGATIVE Final   Staphylococcus aureus POSITIVE (A) NEGATIVE Final    Comment: (NOTE) The Xpert SA Assay (FDA approved for NASAL specimens in patients 27 years of age and older), is one component of a comprehensive surveillance program. It is not intended to diagnose infection nor to guide or monitor treatment. Performed at [facility], [place] [address] [place], [place] [address]     Janene Madeira, MSN, NP-C [place] for Infectious Disease [email or web address] Pager: [phone] Office: [phone] [place]: [phone]

## 2019-07-20 NOTE — Progress Notes (Signed)
Cycler kept alarming. Called [facility] for assist. Unable to restart cycler due to fluids not enough per support. Advised to call the MD to stop treatment or reconnect patient with correct amount of fluids. Will call HD RN to assist.

## 2019-07-21 ENCOUNTER — Encounter (HOSPITAL_COMMUNITY): Payer: Self-pay | Admitting: Internal Medicine

## 2019-07-21 ENCOUNTER — Inpatient Hospital Stay (HOSPITAL_COMMUNITY)
Admission: RE | Admit: 2019-07-21 | Payer: Medicare Other | Source: Intra-hospital | Admitting: Physical Medicine and Rehabilitation

## 2019-07-21 ENCOUNTER — Inpatient Hospital Stay (HOSPITAL_COMMUNITY): Payer: Medicare Other | Admitting: Anesthesiology

## 2019-07-21 ENCOUNTER — Inpatient Hospital Stay (HOSPITAL_COMMUNITY): Payer: Medicare Other

## 2019-07-21 ENCOUNTER — Encounter (HOSPITAL_COMMUNITY)
Admission: AD | Disposition: A | Discharge status: Home Health | Payer: Self-pay | Source: Home / Self Care | Attending: Internal Medicine

## 2019-07-21 HISTORY — PX: AMPUTATION: SHX166

## 2019-07-21 HISTORY — PX: IR US GUIDE VASC ACCESS RIGHT: IMG2390

## 2019-07-21 HISTORY — PX: IR FLUORO GUIDE CV LINE RIGHT: IMG2283

## 2019-07-21 LAB — CBC WITH DIFFERENTIAL/PLATELET
Abs Immature Granulocytes: 0 10*3/uL (ref 0.00–0.07)
Basophils Absolute: 0.4 10*3/uL — ABNORMAL HIGH (ref 0.0–0.1)
Basophils Relative: 2 %
Eosinophils Absolute: 0 10*3/uL (ref 0.0–0.5)
Eosinophils Relative: 0 %
HCT: 26.8 % — ABNORMAL LOW (ref 39.0–52.0)
Hemoglobin: 8.1 g/dL — ABNORMAL LOW (ref 13.0–17.0)
Lymphocytes Relative: 5 %
Lymphs Abs: 1.1 10*3/uL (ref 0.7–4.0)
MCH: 28 pg (ref 26.0–34.0)
MCHC: 30.2 g/dL (ref 30.0–36.0)
MCV: 92.7 fL (ref 80.0–100.0)
Monocytes Absolute: 0.9 10*3/uL (ref 0.1–1.0)
Monocytes Relative: 4 %
Neutro Abs: 19.3 10*3/uL — ABNORMAL HIGH (ref 1.7–7.7)
Neutrophils Relative %: 89 %
Platelets: 228 10*3/uL (ref 150–400)
RBC: 2.89 MIL/uL — ABNORMAL LOW (ref 4.22–5.81)
RDW: 17.4 % — ABNORMAL HIGH (ref 11.5–15.5)
WBC: 21.7 10*3/uL — ABNORMAL HIGH (ref 4.0–10.5)
nRBC: 0 /100 WBC
nRBC: 0.1 % (ref 0.0–0.2)

## 2019-07-21 LAB — GLUCOSE, CAPILLARY
Glucose-Capillary: 131 mg/dL — ABNORMAL HIGH (ref 70–99)
Glucose-Capillary: 154 mg/dL — ABNORMAL HIGH (ref 70–99)
Glucose-Capillary: 160 mg/dL — ABNORMAL HIGH (ref 70–99)
Glucose-Capillary: 169 mg/dL — ABNORMAL HIGH (ref 70–99)
Glucose-Capillary: 227 mg/dL — ABNORMAL HIGH (ref 70–99)
Glucose-Capillary: 293 mg/dL — ABNORMAL HIGH (ref 70–99)

## 2019-07-21 LAB — COMPREHENSIVE METABOLIC PANEL
ALT: <5 U/L (ref 0–44)
AST: 10 U/L — ABNORMAL LOW (ref 15–41)
Albumin: 1.6 g/dL — ABNORMAL LOW (ref 3.5–5.0)
Alkaline Phosphatase: 71 U/L (ref 38–126)
Anion gap: 12 (ref 5–15)
BUN: 40 mg/dL — ABNORMAL HIGH (ref 6–20)
CO2: 24 mmol/L (ref 22–32)
Calcium: 8.8 mg/dL — ABNORMAL LOW (ref 8.9–10.3)
Chloride: 99 mmol/L (ref 98–111)
Creatinine, Ser: 8.42 mg/dL — ABNORMAL HIGH (ref 0.61–1.24)
GFR calc Af Amer: 8 mL/min — ABNORMAL LOW (ref >60)
GFR calc non Af Amer: 7 mL/min — ABNORMAL LOW (ref >60)
Glucose, Bld: 268 mg/dL — ABNORMAL HIGH (ref 70–99)
Potassium: 3.6 mmol/L (ref 3.5–5.1)
Sodium: 135 mmol/L (ref 135–145)
Total Bilirubin: 0.4 mg/dL (ref 0.3–1.2)
Total Protein: 6.5 g/dL (ref 6.5–8.1)

## 2019-07-21 LAB — MAGNESIUM: Magnesium: 1.6 mg/dL — ABNORMAL LOW (ref 1.7–2.4)

## 2019-07-21 LAB — PHOSPHORUS: Phosphorus: 2.8 mg/dL (ref 2.5–4.6)

## 2019-07-21 LAB — HEPARIN LEVEL (UNFRACTIONATED): Heparin Unfractionated: 0.58 IU/mL (ref 0.30–0.70)

## 2019-07-21 IMAGING — US IR US GUIDE VASC ACCESS RIGHT
2 series · 2 of 2 positions shown · non-contrast
Comparison: none

INDICATION: 44-year-old male with a history osteomyelitis

[Series 1: ir us guide vasc access right · 1 of 1 slices shown]
[im 1/1]
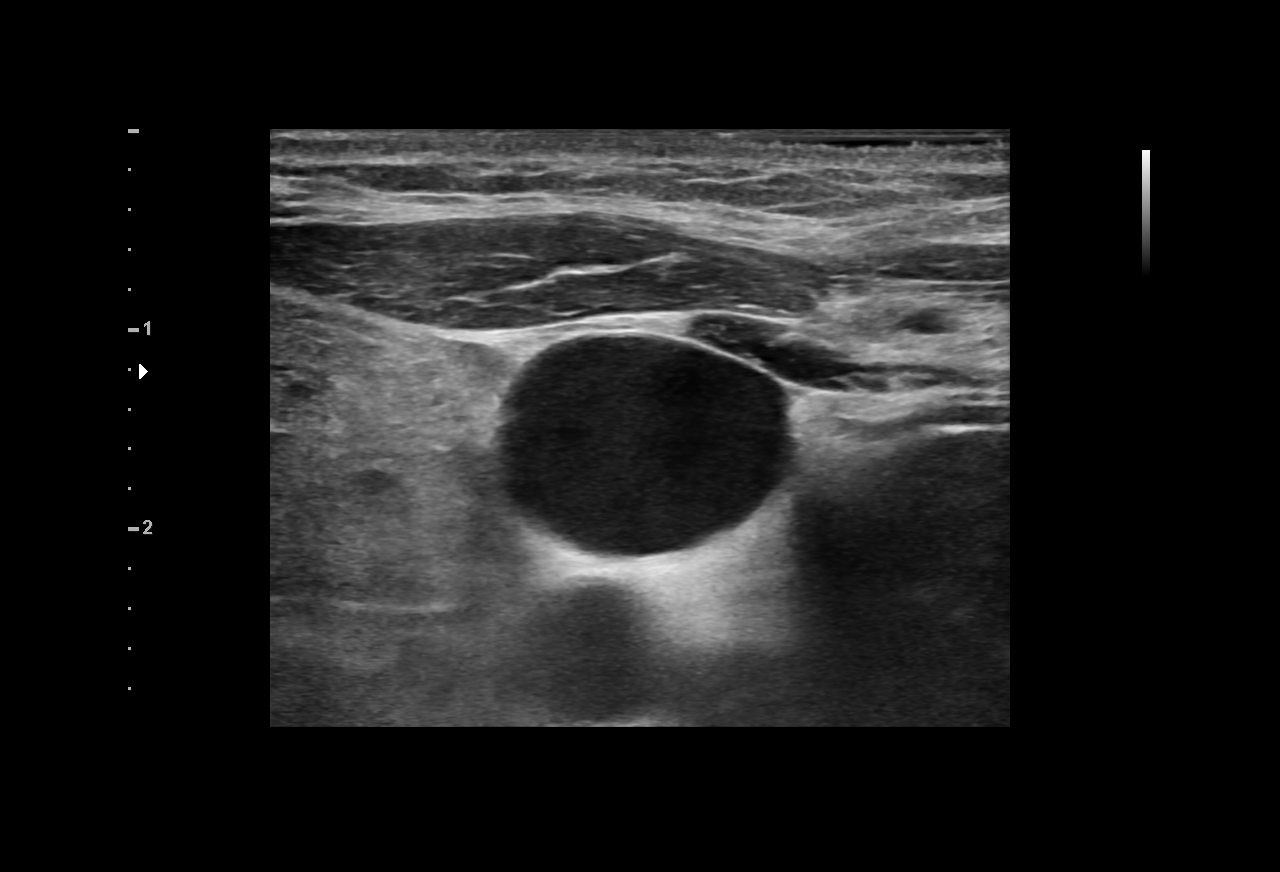

[Series 1: fl (-) angio · 1 of 1 slices shown]
[im 1/1]
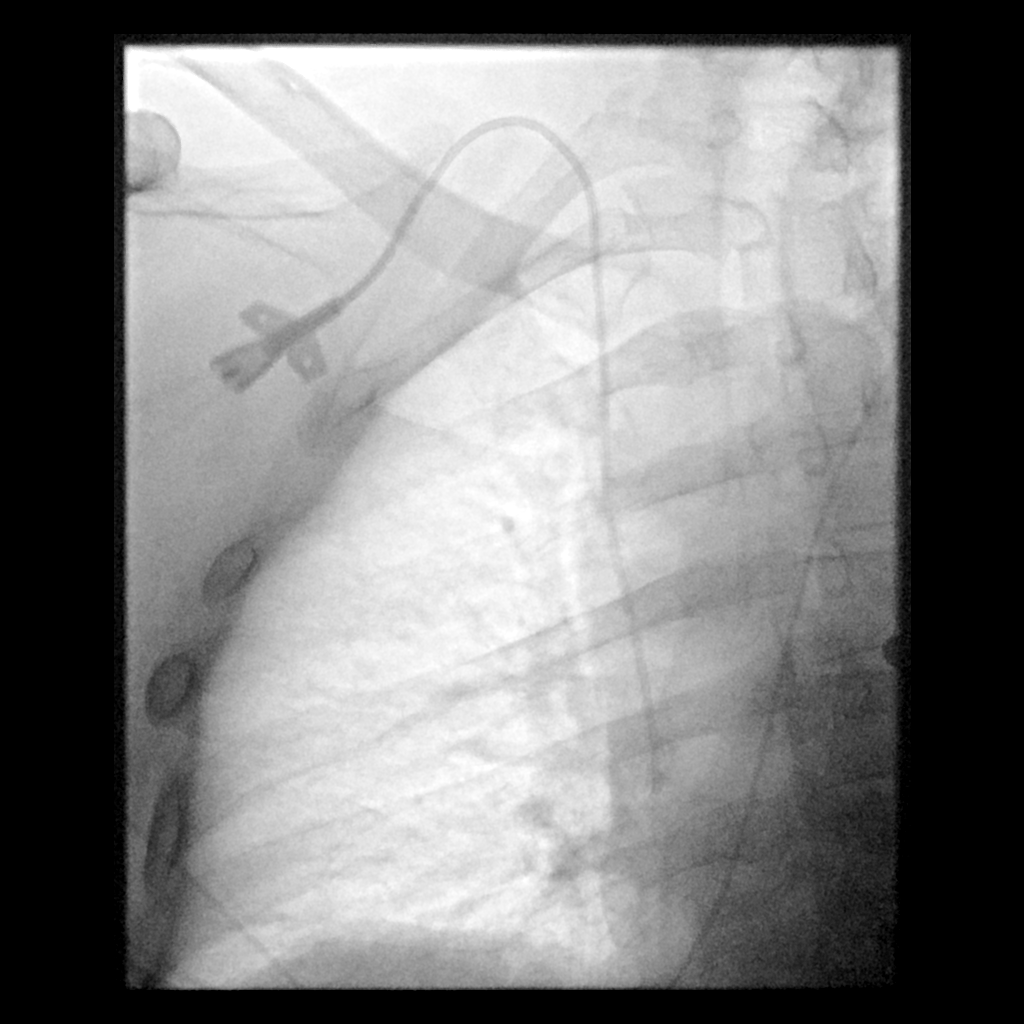

[2 of 2 positions shown; findings below may reference images not displayed]

EXAM:
TUNNELED CENTRAL VENOUS CATHETER WITH ULTRASOUND AND FLUOROSCOPIC
GUIDANCE

MEDICATIONS:
None

ANESTHESIA/SEDATION:
None

FLUOROSCOPY TIME:  Fluoroscopy Time: 1 minutes 12 seconds (6 mGy).

COMPLICATIONS:
None

PROCEDURE:
After written informed consent was obtained, patient was placed in
the supine position on angiographic table.

Patency of the right internal jugular vein was confirmed with
ultrasound with image documentation.

Patient was prepped and draped in the usual sterile fashion
including the right neck and right superior chest.

Using ultrasound guidance, the skin and subcutaneous tissues
overlying the right internal jugular vein were generously
infiltrated with 1% lidocaine without epinephrine. Using ultrasound
guidance, the right internal jugular vein was punctured with a
micropuncture needle, and an 018 wire was advanced into the right
heart confirming venous access. A small stab incision was made with
an 11 blade scalpel.

Peel-away sheath was placed over the wire, and then the wire was
removed, marking the wire for estimation of internal catheter
length.

The chest wall was then generously infiltrated with 1% lidocaine for
local anesthesia along the tissue tract. Small stab incision was
made with 11 blade scalpel, and then the catheter was back tunneled
to the puncture site at the right internal jugular vein.

Catheter was pulled through the tract, with the catheter amputated
at 21 cm.

Catheter was advanced through the peel-away sheath, and the
peel-away sheath was removed.

Final image was stored.

The catheter was anchored to the chest wall with 2 retention
sutures, and Derma bond was used to seal the right internal jugular
vein incision site and at the right chest wall.

Patient tolerated the procedure well and remained hemodynamically
stable throughout.

No complications were encountered and no significant blood loss was
encountered.
FINDINGS: After catheter placement, the tip lies within the superior
cavoatrial junction. The catheter aspirates and flushes normally and
is ready for immediate use.
IMPRESSION: Status post tunneled, cuffed, central venous catheter.

## 2019-07-21 SURGERY — AMPUTATION DIGIT
Anesthesia: Monitor Anesthesia Care | Site: Hand | Laterality: Right

## 2019-07-21 MED ORDER — PROPOFOL 10 MG/ML IV BOLUS
INTRAVENOUS | Status: DC | PRN
  Administered 2019-07-21: 30 mg via INTRAVENOUS

## 2019-07-21 MED ORDER — INSULIN DETEMIR 100 UNIT/ML ~~LOC~~ SOLN
5.0000 [IU] | Freq: Every day | SUBCUTANEOUS | Status: DC
  Administered 2019-07-21 – 2019-07-23 (×3): 5 [IU] via SUBCUTANEOUS
  Filled 2019-07-21 (×3): qty 0.05

## 2019-07-21 MED ORDER — MIDAZOLAM HCL 2 MG/2ML IJ SOLN
INTRAMUSCULAR | Status: AC
  Filled 2019-07-21: qty 2

## 2019-07-21 MED ORDER — LIDOCAINE HCL 1 % IJ SOLN
INTRAMUSCULAR | Status: DC | PRN
  Administered 2019-07-21: 10 mL

## 2019-07-21 MED ORDER — FENTANYL CITRATE (PF) 100 MCG/2ML IJ SOLN
INTRAMUSCULAR | Status: DC | PRN
  Administered 2019-07-21: 50 ug via INTRAVENOUS

## 2019-07-21 MED ORDER — MAGNESIUM SULFATE 2 GM/50ML IV SOLN
2.0000 g | Freq: Once | INTRAVENOUS | Status: AC
  Administered 2019-07-21: 2 g via INTRAVENOUS
  Filled 2019-07-21: qty 50

## 2019-07-21 MED ORDER — LIDOCAINE HCL (PF) 1 % IJ SOLN
INTRAMUSCULAR | Status: AC | PRN
  Administered 2019-07-21: 10 mL

## 2019-07-21 MED ORDER — CHLORHEXIDINE GLUCONATE 0.12 % MT SOLN
15.0000 mL | Freq: Once | OROMUCOSAL | Status: AC
  Administered 2019-07-21: 15 mL via OROMUCOSAL
  Filled 2019-07-21: qty 15

## 2019-07-21 MED ORDER — MIDAZOLAM HCL 5 MG/5ML IJ SOLN
INTRAMUSCULAR | Status: DC | PRN
  Administered 2019-07-21: 2 mg via INTRAVENOUS

## 2019-07-21 MED ORDER — BUPIVACAINE HCL (PF) 0.25 % IJ SOLN
INTRAMUSCULAR | Status: DC | PRN
  Administered 2019-07-21: 10 mL

## 2019-07-21 MED ORDER — BUPIVACAINE HCL (PF) 0.25 % IJ SOLN
INTRAMUSCULAR | Status: AC
  Filled 2019-07-21: qty 30

## 2019-07-21 MED ORDER — 0.9 % SODIUM CHLORIDE (POUR BTL) OPTIME
TOPICAL | Status: DC | PRN
  Administered 2019-07-21: 1000 mL

## 2019-07-21 MED ORDER — FENTANYL CITRATE (PF) 250 MCG/5ML IJ SOLN
INTRAMUSCULAR | Status: AC
  Filled 2019-07-21: qty 5

## 2019-07-21 MED ORDER — SODIUM CHLORIDE 0.9 % IV SOLN: INTRAVENOUS | Status: DC

## 2019-07-21 MED ORDER — LIDOCAINE HCL 1 % IJ SOLN
INTRAMUSCULAR | Status: AC
  Filled 2019-07-21: qty 20

## 2019-07-21 MED ORDER — HEPARIN (PORCINE) 25000 UT/250ML-% IV SOLN
1600.0000 [IU]/h | INTRAVENOUS | Status: DC
  Administered 2019-07-22 (×2): 2000 [IU]/h via INTRAVENOUS
  Administered 2019-07-23: 1800 [IU]/h via INTRAVENOUS
  Administered 2019-07-23: 2000 [IU]/h via INTRAVENOUS
  Administered 2019-07-24: 1800 [IU]/h via INTRAVENOUS
  Administered 2019-07-24 – 2019-07-26 (×4): 1700 [IU]/h via INTRAVENOUS
  Administered 2019-07-27 – 2019-07-28 (×2): 1600 [IU]/h via INTRAVENOUS
  Filled 2019-07-21 (×12): qty 250

## 2019-07-21 SURGICAL SUPPLY — 41 items
BNDG COHESIVE 1X5 TAN STRL LF (GAUZE/BANDAGES/DRESSINGS) IMPLANT
BNDG CONFORM 2 STRL LF (GAUZE/BANDAGES/DRESSINGS) ×1 IMPLANT
BRUSH SCRUB EZ PLAIN DRY (MISCELLANEOUS) ×2 IMPLANT
CORD BIPOLAR FORCEPS 12FT (ELECTRODE) ×2 IMPLANT
COVER SURGICAL LIGHT HANDLE (MISCELLANEOUS) ×2 IMPLANT
COVER WAND RF STERILE (DRAPES) ×2 IMPLANT
CUFF TOURN SGL QUICK 18X4 (TOURNIQUET CUFF) ×2 IMPLANT
CUFF TOURN SGL QUICK 24 (TOURNIQUET CUFF)
CUFF TRNQT CYL 24X4X16.5-23 (TOURNIQUET CUFF) IMPLANT
DRAPE SURG 17X23 STRL (DRAPES) ×2 IMPLANT
DRSG MEPITEL 4X7.2 (GAUZE/BANDAGES/DRESSINGS) ×2 IMPLANT
GAUZE SPONGE 2X2 8PLY STRL LF (GAUZE/BANDAGES/DRESSINGS) IMPLANT
GAUZE SPONGE 4X4 12PLY STRL (GAUZE/BANDAGES/DRESSINGS) ×1 IMPLANT
GAUZE XEROFORM 1X8 LF (GAUZE/BANDAGES/DRESSINGS) ×1 IMPLANT
GLOVE BIOGEL M 8.0 STRL (GLOVE) ×2 IMPLANT
GLOVE SS BIOGEL STRL SZ 8 (GLOVE) ×1 IMPLANT
GLOVE SUPERSENSE BIOGEL SZ 8 (GLOVE) ×1
GOWN STRL REUS W/ TWL LRG LVL3 (GOWN DISPOSABLE) ×1 IMPLANT
GOWN STRL REUS W/ TWL XL LVL3 (GOWN DISPOSABLE) ×2 IMPLANT
GOWN STRL REUS W/TWL LRG LVL3 (GOWN DISPOSABLE) ×2
GOWN STRL REUS W/TWL XL LVL3 (GOWN DISPOSABLE) ×4
KIT BASIN OR (CUSTOM PROCEDURE TRAY) ×2 IMPLANT
KIT TURNOVER KIT B (KITS) ×2 IMPLANT
MANIFOLD NEPTUNE II (INSTRUMENTS) ×2 IMPLANT
NDL HYPO 25GX1X1/2 BEV (NEEDLE) IMPLANT
NEEDLE HYPO 25GX1X1/2 BEV (NEEDLE) IMPLANT
NS IRRIG 1000ML POUR BTL (IV SOLUTION) ×2 IMPLANT
PACK ORTHO EXTREMITY (CUSTOM PROCEDURE TRAY) ×2 IMPLANT
PAD ARMBOARD 7.5X6 YLW CONV (MISCELLANEOUS) ×4 IMPLANT
SOL PREP POV-IOD 4OZ 10% (MISCELLANEOUS) ×6 IMPLANT
SPECIMEN JAR SMALL (MISCELLANEOUS) ×2 IMPLANT
SPONGE GAUZE 2X2 STER 10/PKG (GAUZE/BANDAGES/DRESSINGS)
SUT CHROMIC 4 0 P 3 18 (SUTURE) ×1 IMPLANT
SUT MERSILENE 4 0 P 3 (SUTURE) IMPLANT
SUT PROLENE 4 0 PS 2 18 (SUTURE) IMPLANT
SYR CONTROL 10ML LL (SYRINGE) IMPLANT
TOWEL GREEN STERILE (TOWEL DISPOSABLE) ×2 IMPLANT
TOWEL GREEN STERILE FF (TOWEL DISPOSABLE) ×2 IMPLANT
TUBE CONNECTING 12X1/4 (SUCTIONS) IMPLANT
UNDERPAD 30X36 HEAVY ABSORB (UNDERPADS AND DIAPERS) ×2 IMPLANT
WATER STERILE IRR 1000ML POUR (IV SOLUTION) ×2 IMPLANT

## 2019-07-21 NOTE — Progress Notes (Signed)
ANTICOAGULATION CONSULT NOTE - Follow Up Consult  Pharmacy Consult for Heparin Indication: h/o DVT, recent CVA  Allergies  Allergen Reactions  . Doxycycline Hives  . Latex Swelling    Pt reports swelling at site.   . Morphine And Related Anxiety    Patient Measurements: Height: 5' 9.02" (175.3 cm) Weight: 121.5 kg (267 lb 13.7 oz) IBW/kg (Calculated) : 70.74 Heparin Dosing Weight:    Vital Signs: Temp: 98.4 F (36.9 C) (06/25 2006) Temp Source: Oral (06/25 1643) BP: 135/90 (06/25 2037) Pulse Rate: 95 (06/25 2037)  Labs: Recent Labs    07/19/19 0359 07/20/19 0407 07/21/19 0419  HGB  --   --  8.1*  HCT  --   --  26.8*  PLT  --   --  228  LABPROT 16.3* 16.4*  --   INR 1.4* 1.4*  --   HEPARINUNFRC 0.61 0.49 0.58  CREATININE 8.58* 8.88* 8.42*    Estimated Creatinine Clearance: 14.6 mL/min (A) (by C-G formula based on SCr of 8.42 mg/dL (H)).   Assessment: 44 yo M on warfarin PTA for hx DVT s/p Vit K 5 mg IV x 1 on 6/18. 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 Transitioned to heparin for finger amputation, ok to restart per NP 6/25 after surgery.   Patient was therapeutic on 2000 units/hr before surgery. Will restart.   Goal of Therapy:  Heparin level 0.3-0.7 units/ml Monitor platelets by anticoagulation protocol: Yes   Plan:  Restart heparin at 2000 units/hr Monitor daily HL, CBC/plt Monitor for signs/symptoms of bleeding  F/u restart warfarin  Benetta Spar, PharmD, BCPS, [facility] Clinical Pharmacist  Please check AMION for all [place] phone numbers After 10:00 PM, call [place]

## 2019-07-21 NOTE — Progress Notes (Signed)
Inpatient Diabetes Program Recommendations  AACE/ADA: New Consensus Statement on Inpatient Glycemic Control (2015)  Target Ranges:  Prepandial:   less than 140 mg/dL      Peak postprandial:   less than 180 mg/dL (1-2 hours)      Critically ill patients:  140 - 180 mg/dL   Results for CHEVEYO, VIRGINIA (MRN [id number]) as of 07/21/2019 09:03  Ref. Range 07/19/2019 23:47 07/20/2019 05:38 07/20/2019 11:28 07/20/2019 18:51  Glucose-Capillary Latest Ref Range: 70 - 99 mg/dL 99 135 (H) 188 (H)  2 units NOVOLOG  152 (H)  2 units NOVOLOG    Results for TOBEN, ACUNA (MRN [id number]) as of 07/21/2019 09:03  Ref. Range 07/21/2019 00:06 07/21/2019 05:48  Glucose-Capillary Latest Ref Range: 70 - 99 mg/dL 293 (H)  5 units NOVOLOG  227 (H)  3 units NOVOLOG      Home DM Meds: Actos 15 mg Daily  Current Orders: Novolog Sensitive Correction Scale/ SSI (0-9 units) Q6 hours    MD- Note Prednisone 20 mg BID started last PM.  CBGs now >200 since Prednisone started.  Note patient NPO this AM for finger amputation today.  Please consider the following while patient getting Prednisone in hospital:  1. Change Novolog SSi to TID AC + HS (currently ordered Q6 hours)  2. Start Novolog Meal Coverage once patient resumes diet later today: Novolog 3 units TID with meals  (Please add the following Hold Parameters: Hold if pt eats <50% of meal, Hold if pt NPO)      --Will follow patient during hospitalization--  Wyn Quaker RN, MSN, CDE Diabetes Coordinator Inpatient Glycemic Control Team Team Pager: [phone] (8a-5p)

## 2019-07-21 NOTE — Progress Notes (Signed)
Physical Therapy Treatment Patient Details Name: Max Pittman. MRN: [id number] DOB: Mar 09, 1975 Today's Date: 07/21/2019    History of Present Illness 44 y.o. male with medical history significant for ESRD on peritoneal dialysis, diabetes mellitus, hypertension, DVT. Pt with L hip surgery in April, having persistent pain in this hip since, unable to walk recently due to pain. 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 Pt also hospitalized 6/4-10 for respiratory failure. Pt with L hip fluid collection, undergoing I&D L hip on 6/18. 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    PT Comments    Pt in bed upon arrival of PT, agreeable to session with focus on progressing OOB mobility. The pt was able to complete multiple transfers from EOB with minA due to pain in his LLE, but pt demos good ability to maintain PWB LLE. The pt was then able to complete multiple short bouts of ambulation in his room (limited by lines), but will continue to progress following removal allowing for hallway ambulation. The pt continues to present with deficits in functional strength, stability, and endurance, all further impacted by pain in his LLE and will continue to benefit from intensive therapies to facilitate return to prior level of function and independence.     Follow Up Recommendations  CIR     Equipment Recommendations  None recommended by PT    Recommendations for Other Services       Precautions / Restrictions Precautions Precautions: Fall;Posterior Hip Precaution Booklet Issued: Yes (comment) Precaution Comments: watch 02 Restrictions Weight Bearing Restrictions: Yes LLE Weight Bearing: Partial weight bearing LLE Partial Weight Bearing Percentage or Pounds: 50%    Mobility  Bed Mobility Overal bed mobility: Needs Assistance Bed Mobility: Supine to Sit;Sit to Supine     Supine to sit: Min assist Sit to  supine: Min assist   General bed mobility comments: minA to complete transfer to L side of bed, minA to return LE to bed  Transfers Overall transfer level: Needs assistance Equipment used: Rolling walker (2 wheeled) Transfers: Sit to/from Stand Sit to Stand: Min assist         General transfer comment: minA to power up with VC for hand placement. Pt completed x5 through session, reports increased pain during transfer but relieved at rest  Ambulation/Gait Ambulation/Gait assistance: Min guard Gait Distance (Feet): 5 Feet (x4) Assistive device: Rolling walker (2 wheeled) Gait Pattern/deviations: Step-to pattern Gait velocity: reduced Gait velocity interpretation: <1.8 ft/sec, indicate of risk for recurrent falls General Gait Details: pt with short step to gait, dependent on UE support. limited by lines in room, completed 65ft forward and back x4. sig increased difficulty with backwards, given cues for smaller steps for improved safety       Balance Overall balance assessment: Needs assistance Sitting-balance support: Single extremity supported;Feet supported Sitting balance-Leahy Scale: Good Sitting balance - Comments: supervision   Standing balance support: Bilateral upper extremity supported Standing balance-Leahy Scale: Poor Standing balance comment: requires B UE support on RW to maintain balance and decrease anxiety                            Cognition Arousal/Alertness: Awake/alert Behavior During Therapy: WFL for tasks assessed/performed;Anxious Overall Cognitive Status: Within Functional Limits for tasks assessed  General Comments General comments (skin integrity, edema, etc.): HR in to 104bpm after activity. Pt able to recall precautions      Pertinent Vitals/Pain Pain Assessment: 0-10 Pain Score: 8  Pain Location: L knee to thigh with sit-stand Pain Descriptors / Indicators:  Aching;Grimacing;Sore Pain Intervention(s): Limited activity within patient's tolerance;Monitored during session;Premedicated before session;Repositioned           PT Goals (current goals can now be found in the care plan section) Acute Rehab PT Goals Patient Stated Goal: do whatever it takes to get better and go home, pain control PT Goal Formulation: With patient Time For Goal Achievement: 07/30/19 Potential to Achieve Goals: Good Progress towards PT goals: Progressing toward goals    Frequency    Min 3X/week      PT Plan Current plan remains appropriate       AM-PAC PT "6 Clicks" Mobility   Outcome Measure  Help needed turning from your back to your side while in a flat bed without using bedrails?: A Little Help needed moving from lying on your back to sitting on the side of a flat bed without using bedrails?: A Little Help needed moving to and from a bed to a chair (including a wheelchair)?: A Little Help needed standing up from a chair using your arms (e.g., wheelchair or bedside chair)?: A Little Help needed to walk in hospital room?: A Little Help needed climbing 3-5 steps with a railing? : A Lot 6 Click Score: 17    End of Session Equipment Utilized During Treatment: Gait belt Activity Tolerance: Patient tolerated treatment well Patient left: in bed;with call bell/phone within reach Nurse Communication: Mobility status PT Visit Diagnosis: Pain;Other (comment);Muscle weakness (generalized) (M62.81);Difficulty in walking, not elsewhere classified (R26.2) Pain - Right/Left: Left Pain - part of body: Leg     Time: 9323-5573 PT Time Calculation (min) (ACUTE ONLY): 29 min  Charges:  $Gait Training: 23-37 mins                     Karma Ganja, PT, DPT   Acute Rehabilitation Department Pager #: [phone]   Otho Bellows 07/21/2019, 3:17 PM

## 2019-07-21 NOTE — Discharge Instructions (Addendum)
[facility] Discharge Instructions   General Discharge Instructions  WEIGHT BEARING STATUS: Weightbear as tolerated L leg   RANGE OF MOTION/ACTIVITY: posterior hip precautions Left hip   Wound Care: daily wound care starting immediately.  See below   Discharge Wound Care Instructions  Do NOT apply any ointments, solutions or lotions to pin sites or surgical wounds.  These prevent needed drainage and even though solutions like hydrogen peroxide kill bacteria, they also damage cells lining the pin sites that help fight infection.  Applying lotions or ointments can keep the wounds moist and can cause them to breakdown and open up as well. This can increase the risk for infection. When in doubt call the office.  Surgical incisions should be dressed daily.  If any drainage is noted, use one layer of adaptic, then gauze and tape. Alternatively you can use a mepilex type dressing. This is the beige colored dressing you had on discharge from the hospital   Once the incision is completely dry and without drainage, it may be left open to air out.  Showering may begin 36-48 hours later.  Cleaning gently with soap and water.  Diet: as you were eating previously.  Can use over the counter stool softeners and bowel preparations, such as Miralax, to help with bowel movements.  Narcotics can be constipating.  Be sure to drink plenty of fluids  PAIN MEDICATION USE AND EXPECTATIONS  You have likely been given narcotic medications to help control your pain.  After a traumatic event that results in an fracture (broken bone) with or without surgery, it is ok to use narcotic pain medications to help control one's pain.  We understand that everyone responds to pain differently and each individual patient will be evaluated on a regular basis for the continued need for narcotic medications. Ideally, narcotic medication use should last no more than 6-8 weeks (coinciding with fracture healing).    As a patient it is your responsibility as well to monitor narcotic medication use and report the amount and frequency you use these medications when you come to your office visit.   We would also advise that if you are using narcotic medications, you should take a dose prior to therapy to maximize you participation.  IF YOU ARE ON NARCOTIC MEDICATIONS IT IS NOT PERMISSIBLE TO OPERATE A MOTOR VEHICLE (MOTORCYCLE/CAR/TRUCK/MOPED) OR HEAVY MACHINERY DO NOT MIX NARCOTICS WITH OTHER CNS (CENTRAL NERVOUS SYSTEM) DEPRESSANTS SUCH AS ALCOHOL   STOP SMOKING OR USING NICOTINE PRODUCTS!!!!  As discussed nicotine severely impairs your body's ability to heal surgical and traumatic wounds but also impairs bone healing.  Wounds and bone heal by forming microscopic blood vessels (angiogenesis) and nicotine is a vasoconstrictor (essentially, shrinks blood vessels).  Therefore, if vasoconstriction occurs to these microscopic blood vessels they essentially disappear and are unable to deliver necessary nutrients to the healing tissue.  This is one modifiable factor that you can do to dramatically increase your chances of healing your injury.    (This means no smoking, no nicotine gum, patches, etc)  DO NOT USE NONSTEROIDAL ANTI-INFLAMMATORY DRUGS (NSAID'S)  Using products such as Advil (ibuprofen), Aleve (naproxen), Motrin (ibuprofen) for additional pain control during fracture healing can delay and/or prevent the healing response.  If you would like to take over the counter (OTC) medication, Tylenol (acetaminophen) is ok.  However, some narcotic medications that are given for pain control contain acetaminophen as well. Therefore, you should not exceed more than 4000 mg of tylenol in  a day if you do not have liver disease.  Also note that there are may OTC medicines, such as cold medicines and allergy medicines that my contain tylenol as well.  If you have any questions about medications and/or interactions please ask  your doctor/PA or your pharmacist.      ICE AND ELEVATE INJURED/OPERATIVE EXTREMITY  Using ice and elevating the injured extremity above your heart can help with swelling and pain control.  Icing in a pulsatile fashion, such as 20 minutes on and 20 minutes off, can be followed.    Do not place ice directly on skin. Make sure there is a barrier between to skin and the ice pack.    Using frozen items such as frozen peas works well as the conform nicely to the are that needs to be iced.  USE AN ACE WRAP OR TED HOSE FOR SWELLING CONTROL  In addition to icing and elevation, Ace wraps or TED hose are used to help limit and resolve swelling.  It is recommended to use Ace wraps or TED hose until you are informed to stop.    When using Ace Wraps start the wrapping distally (farthest away from the body) and wrap proximally (closer to the body)   Example: If you had surgery on your leg or thing and you do not have a splint on, start the ace wrap at the toes and work your way up to the thigh        If you had surgery on your upper extremity and do not have a splint on, start the ace wrap at your fingers and work your way up to the upper arm  IF YOU ARE IN A SPLINT OR CAST DO NOT [place]   If your splint gets wet for any reason please contact the office immediately. You may shower in your splint or cast as long as you keep it dry.  This can be done by wrapping in a cast cover or garbage back (or similar)  Do Not stick any thing down your splint or cast such as pencils, money, or hangers to try and scratch yourself with.  If you feel itchy take benadryl as prescribed on the bottle for itching  IF YOU ARE IN A CAM BOOT (BLACK BOOT)  You may remove boot periodically. Perform daily dressing changes as noted below.  Wash the liner of the boot regularly and wear a sock when wearing the boot. It is recommended that you sleep in the boot until told otherwise    Call office for the  following:  Temperature greater than 101F  Persistent nausea and vomiting  Severe uncontrolled pain  Redness, tenderness, or signs of infection (pain, swelling, redness, odor or green/yellow discharge around the site)  Difficulty breathing, headache or visual disturbances  Hives  Persistent dizziness or light-headedness  Extreme fatigue  Any other questions or concerns you may have after discharge  In an emergency, call 911 or go to an Emergency Department at a nearby hospital    [place]: [phone]   VISIT OUR WEBSITE FOR ADDITIONAL INFORMATION: [email or web address]  Information on my medicine - Coumadin   (Warfarin)  This medication education was reviewed with me or my healthcare representative as part of my discharge preparation. You were taking this medication prior to this hospital admission.   Why was Coumadin prescribed for you? Coumadin was prescribed for you because you have a blood clot or a  medical condition that can cause an increased risk of forming blood clots. Blood clots can cause serious health problems by blocking the flow of blood to the heart, lung, or brain. Coumadin can prevent harmful blood clots from forming. As a reminder your indication for Coumadin is:   Deep Vein Thrombosis Treatment  History of recent blood clot / recent CVA. What test will check on my response to Coumadin? While on Coumadin (warfarin) you will need to have an INR test regularly to ensure that your dose is keeping you in the desired range. The INR (international normalized ratio) number is calculated from the result of the laboratory test called prothrombin time (PT).  If an INR APPOINTMENT HAS NOT ALREADY BEEN MADE FOR YOU please schedule an appointment to have this lab work done by your health care provider within 7 days. Your INR goal is usually a number between:  2 to 3 or your provider may give you a more narrow range like 2-2.5.  Ask  your health care provider during an office visit what your goal INR is.  What  do you need to  know  About  COUMADIN? Take Coumadin (warfarin) exactly as prescribed by your healthcare provider about the same time each day.  DO NOT stop taking without talking to the doctor who prescribed the medication.  Stopping without other blood clot prevention medication to take the place of Coumadin may increase your risk of developing a new clot or stroke.  Get refills before you run out.  What do you do if you miss a dose? If you miss a dose, take it as soon as you remember on the same day then continue your regularly scheduled regimen the next day.  Do not take two doses of Coumadin at the same time.  Important Safety Information A possible side effect of Coumadin (Warfarin) is an increased risk of bleeding. You should call your healthcare provider right away if you experience any of the following: ? Bleeding from an injury or your nose that does not stop. ? Unusual colored urine (red or dark brown) or unusual colored stools (red or black). ? Unusual bruising for unknown reasons. ? A serious fall or if you hit your head (even if there is no bleeding).  Some foods or medicines interact with Coumadin (warfarin) and might alter your response to warfarin. To help avoid this: ? Eat a balanced diet, maintaining a consistent amount of Vitamin K. ? Notify your provider about major diet changes you plan to make. ? Avoid alcohol or limit your intake to 1 drink for women and 2 drinks for men per day. (1 drink is 5 oz. wine, 12 oz. beer, or 1.5 oz. liquor.)  Make sure that ANY health care provider who prescribes medication for you knows that you are taking Coumadin (warfarin).  Also make sure the healthcare provider who is monitoring your Coumadin knows when you have started a new medication including herbals and non-prescription products.  Coumadin (Warfarin)  Major Drug Interactions  Increased Warfarin Effect  Decreased Warfarin Effect  Alcohol (large quantities) Antibiotics (esp. Septra/Bactrim, Flagyl, Cipro) Amiodarone (Cordarone) Aspirin (ASA) Cimetidine (Tagamet) Megestrol (Megace) NSAIDs (ibuprofen, naproxen, etc.) Piroxicam (Feldene) Propafenone (Rythmol SR) Propranolol (Inderal) Isoniazid (INH) Posaconazole (Noxafil) Barbiturates (Phenobarbital) Carbamazepine (Tegretol) Chlordiazepoxide (Librium) Cholestyramine (Questran) Griseofulvin Oral Contraceptives Rifampin Sucralfate (Carafate) Vitamin K   Coumadin (Warfarin) Major Herbal Interactions  Increased Warfarin Effect Decreased Warfarin Effect  Garlic Ginseng Ginkgo biloba Coenzyme Q10 Green tea St. Johns wort  Coumadin (Warfarin) FOOD Interactions  Eat a consistent number of servings per week of foods HIGH in Vitamin K (1 serving =  cup)  Collards (cooked, or boiled & drained) Kale (cooked, or boiled & drained) Mustard greens (cooked, or boiled & drained) Parsley *serving size only =  cup Spinach (cooked, or boiled & drained) Swiss chard (cooked, or boiled & drained) Turnip greens (cooked, or boiled & drained)  Eat a consistent number of servings per week of foods MEDIUM-HIGH in Vitamin K (1 serving = 1 cup)  Asparagus (cooked, or boiled & drained) Broccoli (cooked, boiled & drained, or raw & chopped) Brussel sprouts (cooked, or boiled & drained) *serving size only =  cup Lettuce, raw (green leaf, endive, romaine) Spinach, raw Turnip greens, raw & chopped   These websites have more information on Coumadin (warfarin):  FailFactory.se; VeganReport.com.au;

## 2019-07-21 NOTE — Progress Notes (Signed)
Occupational Therapy Treatment Patient Details Name: Max Pittman. MRN: [id number] DOB: 1975-06-02 Today's Date: 07/21/2019    History of present illness 44 y.o. male with medical history significant for ESRD on peritoneal dialysis, diabetes mellitus, hypertension, DVT. Pt with L hip surgery in April, having persistent pain in this hip since, unable to walk recently due to pain. 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 Pt also hospitalized 6/4-10 for respiratory failure. Pt with L hip fluid collection, undergoing I&D L hip on 6/18. 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   OT comments  Pt progressing well with OT goals and received sitting EOB completing sponge bathing task. Pt overall Setup for UB ADLs, Min A for LB bathing and requires assistance to don socks without AE due to posterior hip precautions. Pt able to recall all 3/3 posterior hip precautions with increased time today. Pt demonstrated short distance mobility in room and stand pivot to recliner chair using RW with min guard at most with improving safety awareness. Will continue to follow acutely.    Follow Up Recommendations  CIR;Supervision/Assistance - 24 hour    Equipment Recommendations  None recommended by OT (has all needed AE/DME at home)    Recommendations for Other Services      Precautions / Restrictions Precautions Precautions: Fall;Posterior Hip Precaution Booklet Issued: Yes (comment) Precaution Comments: watch 02 Restrictions Weight Bearing Restrictions: Yes LLE Weight Bearing: Partial weight bearing LLE Partial Weight Bearing Percentage or Pounds: 50%       Mobility Bed Mobility Overal bed mobility: Needs Assistance Bed Mobility: Sit to Supine     Supine to sit: Min assist Sit to supine: Min guard   General bed mobility comments: min guard for advancement of L LE onto bed  Transfers Overall transfer  level: Needs assistance Equipment used: Rolling walker (2 wheeled) Transfers: Sit to/from [facility] Sit to Stand: Supervision Stand pivot transfers: Min guard       General transfer comment: min guard for stand pivot to recliner chair, minor cues for sequencing and mgmt of lines, but otherwise good safety awareness    Balance Overall balance assessment: Needs assistance Sitting-balance support: Single extremity supported;Feet supported Sitting balance-Leahy Scale: Good Sitting balance - Comments: supervision   Standing balance support: Bilateral upper extremity supported Standing balance-Leahy Scale: Poor Standing balance comment: requires B UE support on RW to maintain balance and decrease anxiety                           ADL either performed or assessed with clinical judgement   ADL Overall ADL's : Needs assistance/impaired         Upper Body Bathing: Set up;Sitting   Lower Body Bathing: Minimal assistance;Sit to/from stand Lower Body Bathing Details (indicate cue type and reason): Min A to bathe B feet sitting EOB Upper Body Dressing : Set up;Sitting Upper Body Dressing Details (indicate cue type and reason): Setup to don hospital gown sitting EOB Lower Body Dressing: Minimal assistance;Sit to/from stand Lower Body Dressing Details (indicate cue type and reason): Min A for LB dressing overall, but Max A to don socks without AE sitting EOB             Functional mobility during ADLs: Min guard;Rolling walker General ADL Comments: Pt with overall improved pain control and ability to complete ADLs with good problem solving. With  AE use, pt would further increase independence. Pt able to recall 3/3 hip precautions with increased time     Vision   Vision Assessment?: No apparent visual deficits   Perception     Praxis      Cognition Arousal/Alertness: Awake/alert Behavior During Therapy: WFL for tasks  assessed/performed;Anxious Overall Cognitive Status: Within Functional Limits for tasks assessed                                          Exercises     Shoulder Instructions       General Comments HR WFL during OT session. Reinforced use of AE and DME at home to maximize safety. Pt with improved confidence and completion of tasks today    Pertinent Vitals/ Pain       Pain Assessment: Faces Pain Score: 8  Faces Pain Scale: Hurts little more Pain Location: L hip Pain Descriptors / Indicators: Aching;Grimacing;Sore Pain Intervention(s): Premedicated before session;Monitored during session  Home Living                                          Prior Functioning/Environment              Frequency  Min 2X/week        Progress Toward Goals  OT Goals(current goals can now be found in the care plan section)  Progress towards OT goals: Progressing toward goals  Acute Rehab OT Goals Patient Stated Goal: do whatever it takes to get better and go home, pain control OT Goal Formulation: With patient Time For Goal Achievement: 08/02/19 Potential to Achieve Goals: Good ADL Goals Pt Will Perform Grooming: with supervision;standing Pt Will Perform Lower Body Bathing: with min guard assist;sit to/from stand;sitting/lateral leans Pt Will Perform Lower Body Dressing: with min assist;with adaptive equipment;sitting/lateral leans;sit to/from stand Pt Will Transfer to Toilet: with min assist;stand pivot transfer;bedside commode Pt Will Perform Toileting - Clothing Manipulation and hygiene: with supervision;sit to/from stand;sitting/lateral leans Additional ADL Goal #1: Pt will verbalize 3/3 posterior hip precautions without prompts in order to promote optimal healing and increased safety with ADLs/mobility.  Plan Discharge plan remains appropriate    Co-evaluation                 AM-PAC OT "6 Clicks" Daily Activity     Outcome Measure    Help from another person eating meals?: None Help from another person taking care of personal grooming?: A Little Help from another person toileting, which includes using toliet, bedpan, or urinal?: A Little Help from another person bathing (including washing, rinsing, drying)?: A Little Help from another person to put on and taking off regular upper body clothing?: A Little Help from another person to put on and taking off regular lower body clothing?: A Little 6 Click Score: 19    End of Session Equipment Utilized During Treatment: Gait belt;Rolling walker  OT Visit Diagnosis: Unsteadiness on feet (R26.81);Muscle weakness (generalized) (M62.81);History of falling (Z91.81);Pain Pain - Right/Left: Left Pain - part of body: Hip   Activity Tolerance Patient tolerated treatment well   Patient Left in bed;with call bell/phone within reach   Nurse Communication Mobility status;Patient requests pain meds        Time: 1355-1434 OT Time Calculation (min): 39 min  Charges: OT  General Charges $OT Visit: 1 Visit OT Treatments $Self Care/Home Management : 23-37 mins $Therapeutic Activity: 8-22 mins  Layla Maw, OTR/L   Layla Maw 07/21/2019, 3:40 PM

## 2019-07-21 NOTE — Progress Notes (Signed)
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 thickness. Right ventricular systolic function is normal. Left Atrium: Left atrial size was normal in size. No left atrial/left atrial appendage thrombus was detected. Right Atrium: Right atrial size was normal in size. Pericardium: There is no evidence of pericardial effusion. Mitral Valve: The mitral valve is normal in structure. Normal mobility of the mitral valve leaflets. Mild mitral valve regurgitation. No evidence of mitral valve stenosis. Tricuspid Valve: The tricuspid valve is normal in structure. Tricuspid valve regurgitation is mild . No evidence of tricuspid stenosis. Aortic Valve: The aortic valve is normal in structure. Aortic valve regurgitation is not  visualized. No aortic stenosis is present. Pulmonic Valve: The pulmonic valve was normal in structure. Pulmonic valve regurgitation is trivial. No evidence of pulmonic stenosis. Aorta: The aortic root is normal in size and structure. Venous: The inferior vena cava is normal in size with greater than 50% respiratory variability, suggesting right atrial pressure of 3 mmHg. IAS/Shunts: No atrial level shunt detected by color flow Doppler. Lipomatous interatrial septum with thin hypermobile mid section. There is no evidence of shunting by colorflow doppler. Fransico Him MD Electronically signed by Fransico Him MD Signature Date/Time: 07/20/2019/12:29:08 AM    Final         Scheduled Meds: . amLODipine  5 mg Oral Daily  . atorvastatin  40 mg Oral q1800  . buPROPion  150 mg Oral BID WC  . Chlorhexidine Gluconate Cloth  6 each Topical Daily  . [START ON 07/23/2019] darbepoetin (ARANESP) injection - NON-DIALYSIS  200 mcg Subcutaneous Q Sun-1800  . feeding supplement (PRO-STAT SUGAR FREE 64)  30 mL Oral BID  . gentamicin cream  1 application Topical Daily  . insulin aspart  0-9 Units Subcutaneous  Q6H  . metoprolol tartrate  12.5 mg Oral BID  . mupirocin ointment   Nasal BID  . [START ON 07/23/2019] predniSONE  10 mg Oral TID with meals  . [START ON 07/25/2019] predniSONE  10 mg Oral BID WC  . [START ON 07/27/2019] predniSONE  10 mg Oral Q breakfast  . predniSONE  20 mg Oral BID WC  . pregabalin  50 mg Oral QHS  . sevelamer carbonate  2,400 mg Oral TID WC   Continuous Infusions: . sodium chloride 10 mL/hr at 07/14/19 1721  . sodium chloride    .  ceFAZolin (ANCEF) IV Stopped (07/20/19 1648)  . dialysis solution 1.5% low-MG/low-CA    . dialysis solution 2.5% low-MG/low-CA    . heparin 2,000 Units/hr (07/20/19 2205)     LOS: 8 days    Time spent:40 min    Thien Berka, Geraldo Docker, MD [facility] Pager [phone]  If 7PM-7AM, please contact night-coverage [email or web address] Password  [facility] 07/21/2019, 9:43 AM

## 2019-07-21 NOTE — Op Note (Signed)
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  Estimated blood loss minimal  No complications.  Description of procedure the patient is a very pleasant 44 year old male who has a series of unfortunate events.  He currently has a lesion about the finger with osteomyelitic focus loss of skin and open wound.  I discussed with him a definitive treatment would be amputation at the DIP level with bilateral neurectomies.  We have counseled him in regards to this and he has discussed this with infectious disease.  With this in mind he desires to proceed.  Patient was seen underwent intermetacarpal block by myself with lidocaine Sensorcaine without epinephrine.  He has a heparin drip going we left this running as I felt secure with the block.  Following this we prepped and draped him with Hibiclens followed by Betadine scrub once this was complete timeout was observed and the patient underwent a Penrose tourniquet placement.  Fishmouth incision was made irrigation debridement of skin subtenons tissue bone tendon was accomplished.  Following this and copious irrigation with saline we then continued the debridement with curette knife and scissor.  Following this I made step cuts for a fishmouth amputation.  Skin flaps were elevated bilateral neurectomies performed I performed a crushing and cauterization technique of the neural structures to my satisfaction.  I made sure the arterial structures were cauterized with bipolar electrocautery.  I then  disarticulated the distal phalanx at the DIP joint.  Prior to doing so I pulled the FDP tendon distally severed it allowed it to retract.  Wound conditions look good.  The middle phalanx did not appear to be significantly abnormal.  Thus removing the bone is an ineffective cure for the osteomyelitis.  We irrigated copiously and closed wound with combination Prolene and chromic suture.  The patient was dressed with Mepitel Xeroform and a fluff gauze dressing with Coban wrap.  He did quite well.  I would recommend observation follow-up in my office in 14 days notify me should any problems occur he mentioned possibly going home tomorrow and if this is the case I will see him back in 2 weeks.  I will follow up on him Sunday.  Max Hooper MD

## 2019-07-21 NOTE — Plan of Care (Signed)
  Problem: Safety: Goal: Ability to remain free from injury will improve Outcome: Progressing   

## 2019-07-21 NOTE — Plan of Care (Signed)
  Problem: Activity: Goal: Risk for activity intolerance will decrease Outcome: Progressing   Problem: Safety: Goal: Ability to remain free from injury will improve Outcome: Progressing   

## 2019-07-21 NOTE — H&P (Signed)
Patient presents for right index finger DIP amputation.  He understands risk and benefits of surgery and desires to proceed.  He has osteomyelitis about the finger and thus we are trying to perform something for a definitive cure for him given the other multiple medical problems that he is dealing with.  He understands this and will proceed accordingly.  Melanni Benway MD

## 2019-07-21 NOTE — Procedures (Signed)
Interventional Radiology Procedure Note  Procedure: Placement of a right IJ approach tunneled DL central venous catheter. Tip is positioned at the superior cavoatrial junction and catheter is ready for immediate use.  Complications: None Recommendations:  - Ok to shower tomorrow - Do not submerge - Routine line care   Signed,  Dulcy Fanny. Earleen Newport, DO

## 2019-07-21 NOTE — Transfer of Care (Signed)
Immediate Anesthesia Transfer of Care Note  Patient: Max Pittman.  Procedure(s) Performed: Right index finger amputation as necessary at distal interphalangeal joint (Right Hand)  Patient Location: PACU  Anesthesia Type:MAC  Level of Consciousness: awake, alert , oriented and patient cooperative  Airway & Oxygen Therapy: Patient Spontanous Breathing and Patient connected to nasal cannula oxygen  Post-op Assessment: Report given to RN and Post -op Vital signs reviewed and stable  Post vital signs: Reviewed and stable  Last Vitals:  Vitals Value Taken Time  BP 185/86 07/21/19 1937  Temp 37.2 C 07/21/19 1935  Pulse 89 07/21/19 1943  Resp 17 07/21/19 1943  SpO2 98 % 07/21/19 1943  Vitals shown include unvalidated device data.  Last Pain:  Vitals:   07/21/19 1935  TempSrc:   PainSc: (P) 0-No pain      Patients Stated Pain Goal: 0 (67/34/19 3790)  Complications: No complications documented.

## 2019-07-21 NOTE — Progress Notes (Signed)
  [facility] Progress Note   Assessment/ Plan:   1. MSSA bacteremia: in setting of L hip hardware infection and R index finger osteo.  On Cefazolin.  TEE 6/23 no vegetation.  Repeat I and D 6/22  ID on board 2.ESRD continue PD--> Continue all 2.5% tonight. 3. Anemia: continue ESA, increased Aranesp 200 mcg q Sunday 4. CKD-MBD: binders and renal vitamins 5. Nutrition: needs more protein--> on prostat 6. Hypertension: reasonably well controlled, may need to add 4.25% if remains a little elevated 7.  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  Suprapubic pain- send UA and culture 10.  Dispo: pending  Subjective:    For OR today, has some suprapubic pain.     Objective:   BP (!) 148/79 (BP Location: Right Arm)   Pulse 82   Temp 98 F (36.7 C) (Oral)   Resp 18   Ht 5\' 9"  (1.753 m)   Wt 121.5 kg   SpO2 100%   BMI 39.56 kg/m   Physical Exam: Gen: pale, NAD CVS: RRR Resp: clear Abd:PD cath in place, slight tenderness suprapubic region Ext: no LE edema ACCESS: R IJ TDC removed MSK: L hip woundvac +  Labs: BMET Recent Labs  Lab 07/14/19 2048 07/18/19 1232 07/19/19 0359 07/20/19 0407 07/20/19 0819 07/21/19 0419  NA 133* 135 134* 135  --  135  K 4.0 3.1* 2.9* 2.9*  --  3.6  CL 95* 96* 94* 99  --  99  CO2 22 23 26 22   --  24  GLUCOSE 103* 147* 176* 95  --  268*  BUN 46* 44* 43* 49*  --  40*  CREATININE 10.23* 8.69* 8.58* 8.88*  --  8.42*  CALCIUM 8.6* 8.8* 9.0 8.7*  --  8.8*  PHOS  --  5.3* 4.6 4.3 3.9 2.8   CBC Recent Labs  Lab 07/16/19 0417 07/17/19 0718 07/18/19 0408 07/21/19 0419  WBC 13.6* 16.8* 21.9* 21.7*  NEUTROABS  --   --   --  19.3*  HGB 8.0* 8.0* 7.6* 8.1*  HCT 25.8* 26.0* 24.8* 26.8*  MCV 90.2 90.0 89.9 92.7  PLT 264 256 235 228      Medications:    . amLODipine  5 mg Oral Daily  . atorvastatin  40 mg Oral q1800  . buPROPion  150 mg Oral BID WC  . Chlorhexidine Gluconate Cloth  6  each Topical Daily  . [START ON 07/23/2019] darbepoetin (ARANESP) injection - NON-DIALYSIS  200 mcg Subcutaneous Q Sun-1800  . feeding supplement (PRO-STAT SUGAR FREE 64)  30 mL Oral BID  . gentamicin cream  1 application Topical Daily  . insulin aspart  0-9 Units Subcutaneous Q6H  . insulin detemir  5 Units Subcutaneous Daily  . metoprolol tartrate  12.5 mg Oral BID  . [START ON 07/23/2019] predniSONE  10 mg Oral TID with meals  . [START ON 07/25/2019] predniSONE  10 mg Oral BID WC  . [START ON 07/27/2019] predniSONE  10 mg Oral Q breakfast  . predniSONE  20 mg Oral BID WC  . pregabalin  50 mg Oral QHS  . sevelamer carbonate  2,400 mg Oral TID WC     Madelon Lips MD 07/21/2019, 11:45 AM
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
## 2019-07-21 NOTE — Progress Notes (Signed)
[name]  Date of Admission:  07/13/2019      Total days of antibiotics 9  Day 8 cefazolin           ASSESSMENT: Max Pittman. is a 44 y.o. male with MSSA bacteremia complicated by infected left hip hardware with abscess.  He has cleared his blood cultures.  TEE negative for PVE.   R distal pointer finger amputation to be done today.   He is going home with home health RN / PT and has girlfriend for support. He does feel more stable walking in the room but knee pain unchanged. Describes more pain with twisting knee - Ortho team considering MRI to assess for non-infectious pathology.    PLAN: 1. Continue Cefazolin - previous OPAT orders still pertain 2. IR order for tunneled picc placed   Will be available for questions if needed. Will sign off.       Principal Problem:   MSSA bacteremia Active Problems:   Abscess of left hip   Osteomyelitis of finger of right hand ([place])   Peritoneal dialysis status ([place])   Diabetes mellitus ([place])   Multiple closed pelvic fractures with disruption of pelvic circle (HCC)   Diabetic peripheral neuropathy (HCC)   Sepsis (HCC)   Chronic anticoagulation   S/P TAVR (transcatheter aortic valve replacement)   . amLODipine  5 mg Oral Daily  . atorvastatin  40 mg Oral q1800  . buPROPion  150 mg Oral BID WC  . Chlorhexidine Gluconate Cloth  6 each Topical Daily  . [START ON 07/23/2019] darbepoetin (ARANESP) injection - NON-DIALYSIS  200 mcg Subcutaneous Q Sun-1800  . feeding supplement (PRO-STAT SUGAR FREE 64)  30 mL Oral BID  . gentamicin cream  1 application Topical Daily  . insulin aspart  0-9 Units Subcutaneous Q6H  . metoprolol tartrate  12.5 mg Oral BID  . mupirocin ointment   Nasal BID  . [START ON 07/23/2019] predniSONE  10 mg Oral TID with meals  . [START ON 07/25/2019] predniSONE  10 mg Oral BID WC  . [START ON 07/27/2019] predniSONE  10 mg Oral Q breakfast  . predniSONE  20 mg Oral BID  WC  . pregabalin  50 mg Oral QHS  . sevelamer carbonate  2,400 mg Oral TID WC    SUBJECTIVE: Afebrile. No changes with pain in left knee but describes he was up walking partially weight bearing to bathroom and was able to sit and stand better. Describes more pain with twisting motion and with immediate support of weight when he touches down.    Review of Systems: Review of Systems  Constitutional: Negative for chills, fever and malaise/fatigue.  Respiratory: Negative for cough and shortness of breath.   Cardiovascular: Positive for leg swelling (L hip). Negative for chest pain.  Gastrointestinal: Negative for abdominal pain, diarrhea, nausea and vomiting.  Musculoskeletal: Positive for joint pain.       Left leg pain   Neurological: Negative for dizziness and weakness.    Allergies  Allergen Reactions  . Doxycycline Hives  . Latex Swelling    Pt reports swelling at site.   . Morphine And Related Anxiety    OBJECTIVE: Vitals:   07/20/19 1725 07/20/19 2059 07/21/19 0547 07/21/19 0746  BP: 129/76 (!) 163/91 138/85 (!) 148/79  Pulse: 86 89 84 82  Resp: 18 18 19 18   Temp: 97.8 F (36.6 C) 97.9 F (36.6 C) 98.2 F (36.8  C) 98 F (36.7 C)  TempSrc: Oral Oral  Oral  SpO2: 94% 99% 98% 100%  Weight:      Height:       Body mass index is 39.56 kg/m.  Physical Exam Constitutional:      Appearance: Normal appearance.     Comments: Resting comfortably in bed.   HENT:     Mouth/Throat:     Mouth: Mucous membranes are moist.  Eyes:     General: No scleral icterus. Cardiovascular:     Rate and Rhythm: Normal rate and regular rhythm.     Heart sounds: No murmur heard.   Pulmonary:     Effort: Pulmonary effort is normal.     Breath sounds: Normal breath sounds.  Chest:     Comments: HD line removed - clean dry dressing Abdominal:     General: Bowel sounds are normal. There is no distension.  Musculoskeletal:     Comments: No change in exam of knee - no TTP,  restricted flexion. No erythema or effusion  Skin:    General: Skin is warm and dry.     Capillary Refill: Capillary refill takes less than 2 seconds.  Neurological:     Mental Status: He is alert and oriented to person, place, and time.     Lab Results Lab Results  Component Value Date   WBC 21.7 (H) 07/21/2019   HGB 8.1 (L) 07/21/2019   HCT 26.8 (L) 07/21/2019   MCV 92.7 07/21/2019   PLT 228 07/21/2019    Lab Results  Component Value Date   CREATININE 8.42 (H) 07/21/2019   BUN 40 (H) 07/21/2019   NA 135 07/21/2019   K 3.6 07/21/2019   CL 99 07/21/2019   CO2 24 07/21/2019    Lab Results  Component Value Date   ALT <5 07/21/2019   AST 10 (L) 07/21/2019   ALKPHOS 71 07/21/2019   BILITOT 0.4 07/21/2019     Microbiology: Recent Results (from the past 240 hour(s))  Blood culture (routine x 2)     Status: Abnormal   Collection Time: 07/13/19  2:06 PM   Specimen: BLOOD RIGHT ARM  Result Value Ref [place] Status   Specimen Description   Final    BLOOD RIGHT ARM Performed at [facility], [address] [place], [place] [address]    Special Requests   Final    BOTTLES DRAWN AEROBIC AND ANAEROBIC Blood Culture adequate volume Performed at [facility], [address], [place], [place] [address]    Culture  Setup Time   Final    IN BOTH AEROBIC AND ANAEROBIC BOTTLES GRAM POSITIVE COCCI Gram Stain Report Called to,Read Back By and Verified With: C MARSHALL,RN @0311  07/14/19 MKELLY CRITICAL RESULT CALLED TO, READ BACK BY AND VERIFIED WITH: Roxy Manns  [id number] FCP Performed at [facility], [place] [address] [place], [place] [address]    Culture STAPHYLOCOCCUS AUREUS (A)  Final   Report Status 07/16/2019 FINAL  Final   Organism ID, Bacteria STAPHYLOCOCCUS AUREUS  Final      Susceptibility   Staphylococcus aureus - MIC*    CIPROFLOXACIN <=0.5 SENSITIVE Sensitive     ERYTHROMYCIN <=0.25 SENSITIVE Sensitive     GENTAMICIN <=0.5 SENSITIVE Sensitive      OXACILLIN 0.5 SENSITIVE Sensitive     TETRACYCLINE <=1 SENSITIVE Sensitive     VANCOMYCIN <=0.5 SENSITIVE Sensitive     TRIMETH/SULFA <=10 SENSITIVE Sensitive     CLINDAMYCIN <=0.25 SENSITIVE  Sensitive     RIFAMPIN <=0.5 SENSITIVE Sensitive     Inducible Clindamycin NEGATIVE Sensitive     * STAPHYLOCOCCUS AUREUS  Blood Culture ID Panel (Reflexed)     Status: Abnormal   Collection Time: 07/13/19  2:06 PM  Result Value Ref Range Status   Enterococcus species NOT DETECTED NOT DETECTED Final   Listeria monocytogenes NOT DETECTED NOT DETECTED Final   Staphylococcus species DETECTED (A) NOT DETECTED Final    Comment: CRITICAL RESULT CALLED TO, READ BACK BY AND VERIFIED WITH: Roxy Manns  [id number] FCP    Staphylococcus aureus (BCID) DETECTED (A) NOT DETECTED Final    Comment: Methicillin (oxacillin) susceptible Staphylococcus aureus (MSSA). Preferred therapy is anti staphylococcal beta lactam antibiotic (Cefazolin or Nafcillin), unless clinically contraindicated. CRITICAL RESULT CALLED TO, READ BACK BY AND VERIFIED WITH: Roxy Manns  [id number] FCP    Methicillin resistance NOT DETECTED NOT DETECTED Final   Streptococcus species NOT DETECTED NOT DETECTED Final   Streptococcus agalactiae NOT DETECTED NOT DETECTED Final   Streptococcus pneumoniae NOT DETECTED NOT DETECTED Final   Streptococcus pyogenes NOT DETECTED NOT DETECTED Final   Acinetobacter baumannii NOT DETECTED NOT DETECTED Final   Enterobacteriaceae species NOT DETECTED NOT DETECTED Final   Enterobacter cloacae complex NOT DETECTED NOT DETECTED Final   Escherichia coli NOT DETECTED NOT DETECTED Final   Klebsiella oxytoca NOT DETECTED NOT DETECTED Final   Klebsiella pneumoniae NOT DETECTED NOT DETECTED Final   Proteus species NOT DETECTED NOT DETECTED Final   Serratia marcescens NOT DETECTED NOT DETECTED Final   Haemophilus influenzae NOT DETECTED NOT DETECTED Final   Neisseria meningitidis NOT DETECTED NOT  DETECTED Final   Pseudomonas aeruginosa NOT DETECTED NOT DETECTED Final   Candida albicans NOT DETECTED NOT DETECTED Final   Candida glabrata NOT DETECTED NOT DETECTED Final   Candida krusei NOT DETECTED NOT DETECTED Final   Candida parapsilosis NOT DETECTED NOT DETECTED Final   Candida tropicalis NOT DETECTED NOT DETECTED Final    Comment: Performed at [facility], [address] [place], [place] [address]  Blood culture (routine x 2)     Status: Abnormal   Collection Time: 07/13/19  3:02 PM   Specimen: BLOOD RIGHT ARM  Result Value Ref [place] Status   Specimen Description   Final    BLOOD RIGHT ARM Performed at [facility], [address], [place], [place] [address]    Special Requests   Final    BOTTLES DRAWN AEROBIC AND ANAEROBIC Blood Culture adequate volume Performed at [facility], [address], [place], [place] [address]    Culture  Setup Time   Final    AEROBIC BOTTLE ONLY GRAM POSITIVE COCCI Gram Stain Report Called to,Read Back By and Verified With: C MARSHALL,RN @0439  07/14/19 MKELLY GRAM POSITIVE COCCI ANAEROBIC BOTTLE ONLY RESULT PREV. CALLED  Performed at [facility], [address], [place], [place] [address]    Culture (A)  Final    STAPHYLOCOCCUS AUREUS SUSCEPTIBILITIES PERFORMED ON PREVIOUS CULTURE WITHIN THE LAST 5 DAYS. Performed at [facility], [place] [address] [place], [place] [address]    Report Status 07/16/2019 FINAL  Final  SARS Coronavirus 2 by RT PCR (hospital order, performed in [facility] hospital lab) Nasopharyngeal Nasopharyngeal Swab     Status: None   Collection Time: 07/13/19  6:10 PM   Specimen: Nasopharyngeal Swab  Result Value Ref [place] Status   SARS Coronavirus 2 NEGATIVE NEGATIVE Final  Comment: (NOTE) SARS-CoV-2 target nucleic acids are NOT DETECTED.  The SARS-CoV-2 RNA is generally detectable in upper and lower respiratory specimens during the acute phase of infection. The lowest concentration of SARS-CoV-2 viral  copies this assay can detect is 250 copies / mL. A negative result does not preclude SARS-CoV-2 infection and should not be used as the sole basis for treatment or other patient management decisions.  A negative result may occur with improper specimen collection / handling, submission of specimen other than nasopharyngeal swab, presence of viral mutation(s) within the areas targeted by this assay, and inadequate number of viral copies (<250 copies / mL). A negative result must be combined with clinical observations, patient history, and epidemiological information.  Fact Sheet for Patients:   StrictlyIdeas.no  Fact Sheet for Healthcare Providers: BankingDealers.co.za  This test is not yet approved or  cleared by the [place] FDA and has been authorized for detection and/or diagnosis of SARS-CoV-2 by FDA under an Emergency Use Authorization (EUA).  This EUA will remain in effect (meaning this test can be used) for the duration of the COVID-19 declaration under Section 564(b)(1) of the Act, 21 U.S.C. section 360bbb-3(b)(1), unless the authorization is terminated or revoked sooner.  Performed at [facility], [address], [place], [place] [address]   Aerobic/Anaerobic Culture (surgical/deep wound)     Status: None   Collection Time: 07/14/19  3:28 PM   Specimen: PATH Other; Tissue  Result Value Ref [place] Status   Specimen Description WOUND LEFT HIP INCISION  Final   Special Requests NONE  Final   Gram Stain   Final    ABUNDANT WBC PRESENT, PREDOMINANTLY PMN FEW GRAM POSITIVE COCCI    Culture   Final    FEW STAPHYLOCOCCUS AUREUS NO ANAEROBES ISOLATED Performed at [facility], [place] [address] [place], [place] [address]    Report Status 07/19/2019 FINAL  Final   Organism ID, Bacteria STAPHYLOCOCCUS AUREUS  Final      Susceptibility   Staphylococcus aureus - MIC*    CIPROFLOXACIN <=0.5 SENSITIVE Sensitive      ERYTHROMYCIN <=0.25 SENSITIVE Sensitive     GENTAMICIN <=0.5 SENSITIVE Sensitive     OXACILLIN 0.5 SENSITIVE Sensitive     TETRACYCLINE <=1 SENSITIVE Sensitive     VANCOMYCIN <=0.5 SENSITIVE Sensitive     TRIMETH/SULFA <=10 SENSITIVE Sensitive     CLINDAMYCIN <=0.25 SENSITIVE Sensitive     RIFAMPIN <=0.5 SENSITIVE Sensitive     Inducible Clindamycin NEGATIVE Sensitive     * FEW STAPHYLOCOCCUS AUREUS  Culture, blood (Routine X 2) w Reflex to ID Panel     Status: None   Collection Time: 07/15/19  7:24 AM   Specimen: BLOOD RIGHT ARM  Result Value Ref [place] Status   Specimen Description BLOOD RIGHT ARM  Final   Special Requests   Final    AEROBIC BOTTLE ONLY Blood Culture results may not be optimal due to an inadequate volume of blood received in culture bottles   Culture   Final    NO GROWTH 5 DAYS Performed at [facility], [place] [address] [place],  [address]    Report Status 07/20/2019 FINAL  Final  Culture, blood (Routine X 2) w Reflex to ID Panel     Status: None   Collection Time: 07/15/19  7:33 AM   Specimen: BLOOD RIGHT HAND  Result Value Ref [place] Status   Specimen Description BLOOD RIGHT HAND  Final   Special Requests  Final    BOTTLES DRAWN AEROBIC AND ANAEROBIC Blood Culture adequate volume   Culture   Final    NO GROWTH 5 DAYS Performed at [facility], [place] [address], [place], [place] [address]    Report Status 07/20/2019 FINAL  Final  Surgical pcr screen     Status: Abnormal   Collection Time: 07/17/19  7:49 AM   Specimen: Nasal Mucosa; Nasal Swab  Result Value Ref [place] Status   MRSA, PCR NEGATIVE NEGATIVE Final   Staphylococcus aureus POSITIVE (A) NEGATIVE Final    Comment: (NOTE) The Xpert SA Assay (FDA approved for NASAL specimens in patients 28 years of age and older), is one component of a comprehensive surveillance program. It is not intended to diagnose infection nor to guide or monitor treatment. Performed at [facility], [place] [address] [place], [place] [address]     Janene Madeira, MSN, NP-C [facility] [email or web address] Pager: [phone] Office: [phone] [place]: [phone]

## 2019-07-21 NOTE — Progress Notes (Signed)
ANTICOAGULATION CONSULT NOTE - Follow Up Consult  Pharmacy Consult for Heparin Indication: h/o DVT, recent CVA  Allergies  Allergen Reactions   Doxycycline Hives   Latex Swelling    Pt reports swelling at site.    Morphine And Related Anxiety    Patient Measurements: Height: 5\' 9"  (175.3 cm) Weight: 121.5 kg (267 lb 13.7 oz) IBW/kg (Calculated) : 70.7 Heparin Dosing Weight:    Vital Signs: Temp: 98 F (36.7 C) (06/25 0746) Temp Source: Oral (06/25 0746) BP: 148/79 (06/25 0746) Pulse Rate: 82 (06/25 0746)  Labs: Recent Labs    07/19/19 0359 07/20/19 0407 07/21/19 0419  HGB  --   --  8.1*  HCT  --   --  26.8*  PLT  --   --  228  LABPROT 16.3* 16.4*  --   INR 1.4* 1.4*  --   HEPARINUNFRC 0.61 0.49 0.58  CREATININE 8.58* 8.88* 8.42*    Estimated Creatinine Clearance: 14.6 mL/min (A) (by C-G formula based on SCr of 8.42 mg/dL (H)).   Assessment:  Anticoag: PTA warfarin for hx DVT. 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 HL 0.58 in goal. INR still 1.4. Hgb 8.1 stable. Plts WNL and stable. - Orthopedic surgery to dictate when to start warfarin--patient to have finger amputation 6/25. - s/p Vit K 5 mg IV x 1 on 6/18 0037  Goal of Therapy:  Heparin level 0.3-0.7 units/ml Monitor platelets by anticoagulation protocol: Yes   Plan:  Heparin at 2000 units/hr Finger amputation 6/25, then f/u to resume Coumadin Daily HL and CBC   Taysha Majewski S. Alford Highland, PharmD, BCPS Clinical Staff Pharmacist [email or web address] Alford Highland, [employer] 07/21/2019,8:49 AM

## 2019-07-21 NOTE — Anesthesia Postprocedure Evaluation (Signed)
Anesthesia Post Note  Patient: Max Pittman.  Procedure(s) Performed: Right index finger amputation as necessary at distal interphalangeal joint (Right Hand)     Patient location during evaluation: PACU Anesthesia Type: MAC Level of consciousness: awake and alert Pain management: pain level controlled Vital Signs Assessment: post-procedure vital signs reviewed and stable Respiratory status: spontaneous breathing, nonlabored ventilation, respiratory function stable and patient connected to nasal cannula oxygen Cardiovascular status: stable and blood pressure returned to baseline Postop Assessment: no apparent nausea or vomiting Anesthetic complications: no   No complications documented.  Last Vitals:  Vitals:   07/21/19 2006 07/21/19 2037  BP: (!) 152/88 135/90  Pulse: 90 95  Resp: 20 18  Temp: 36.9 C 36.9 C  SpO2: 98% 100%    Last Pain:  Vitals:   07/21/19 2006  TempSrc:   PainSc: 0-No pain                 Tiajuana Amass

## 2019-07-21 NOTE — Anesthesia Preprocedure Evaluation (Addendum)
Anesthesia Evaluation  Patient identified by MRN, date of birth, ID band Patient awake    Reviewed: Allergy & Precautions, NPO status , Patient's Chart, lab work & pertinent test results, reviewed documented beta blocker date and time   History of Anesthesia Complications Negative for: history of anesthetic complications  Airway Mallampati: III  TM Distance: >3 FB Neck ROM: Full    Dental  (+) Dental Advisory Given   Pulmonary sleep apnea , former smoker,    breath sounds clear to auscultation       Cardiovascular hypertension, Pt. on medications and Pt. on home beta blockers + Past MI   Rhythm:Regular Rate:Normal     Neuro/Psych CVA negative psych ROS   GI/Hepatic Neg liver ROS, GERD  Medicated and Controlled,  Endo/Other  negative endocrine ROSdiabetes, Type 2, Oral Hypoglycemic Agents  Renal/GU ESRF and DialysisRenal disease (Cr 8.42, K 3.6, on peritoneal dialysis)  negative genitourinary   Musculoskeletal Osteomyelitis right index finger distal interphalangeal joint   Abdominal   Peds  Hematology  (+) anemia , Hgb 8.1   Anesthesia Other Findings Day of surgery medications reviewed with patient.  Reproductive/Obstetrics negative OB ROS                            Anesthesia Physical Anesthesia Plan  ASA: IV  Anesthesia Plan: MAC   Post-op Pain Management:    Induction:   PONV Risk Score and Plan: 1 and Treatment may vary due to age or medical condition, Propofol infusion and Ondansetron  Airway Management Planned: Natural Airway and Simple Face Mask  Additional Equipment: None  Intra-op Plan:   Post-operative Plan:   Informed Consent: I have reviewed the patients History and Physical, chart, labs and discussed the procedure including the risks, benefits and alternatives for the proposed anesthesia with the patient or authorized representative who has indicated his/her  understanding and acceptance.       Plan Discussed with: CRNA  Anesthesia Plan Comments:        Anesthesia Quick Evaluation

## 2019-07-22 ENCOUNTER — Encounter (HOSPITAL_COMMUNITY): Payer: Self-pay | Admitting: Orthopedic Surgery

## 2019-07-22 DIAGNOSIS — D649 Anemia, unspecified: Secondary | ICD-10-CM | POA: Diagnosis present

## 2019-07-22 LAB — URINALYSIS, ROUTINE W REFLEX MICROSCOPIC
Bilirubin Urine: NEGATIVE
Glucose, UA: >=500 mg/dL — AB
Ketones, ur: NEGATIVE mg/dL
Leukocytes,Ua: NEGATIVE
Nitrite: NEGATIVE
Protein, ur: 100 mg/dL — AB
Specific Gravity, Urine: 1.011 (ref 1.005–1.030)
pH: 7 (ref 5.0–8.0)

## 2019-07-22 LAB — GLUCOSE, CAPILLARY
Glucose-Capillary: 155 mg/dL — ABNORMAL HIGH (ref 70–99)
Glucose-Capillary: 187 mg/dL — ABNORMAL HIGH (ref 70–99)
Glucose-Capillary: 202 mg/dL — ABNORMAL HIGH (ref 70–99)
Glucose-Capillary: 218 mg/dL — ABNORMAL HIGH (ref 70–99)

## 2019-07-22 LAB — COMPREHENSIVE METABOLIC PANEL
ALT: <5 U/L (ref 0–44)
AST: 14 U/L — ABNORMAL LOW (ref 15–41)
Albumin: 1.7 g/dL — ABNORMAL LOW (ref 3.5–5.0)
Alkaline Phosphatase: 71 U/L (ref 38–126)
Anion gap: 14 (ref 5–15)
BUN: 46 mg/dL — ABNORMAL HIGH (ref 6–20)
CO2: 23 mmol/L (ref 22–32)
Calcium: 9.2 mg/dL (ref 8.9–10.3)
Chloride: 101 mmol/L (ref 98–111)
Creatinine, Ser: 9.32 mg/dL — ABNORMAL HIGH (ref 0.61–1.24)
GFR calc Af Amer: 7 mL/min — ABNORMAL LOW (ref >60)
GFR calc non Af Amer: 6 mL/min — ABNORMAL LOW (ref >60)
Glucose, Bld: 157 mg/dL — ABNORMAL HIGH (ref 70–99)
Potassium: 3.7 mmol/L (ref 3.5–5.1)
Sodium: 138 mmol/L (ref 135–145)
Total Bilirubin: 0.5 mg/dL (ref 0.3–1.2)
Total Protein: 6.2 g/dL — ABNORMAL LOW (ref 6.5–8.1)

## 2019-07-22 LAB — CBC WITH DIFFERENTIAL/PLATELET
Abs Immature Granulocytes: 2.44 10*3/uL — ABNORMAL HIGH (ref 0.00–0.07)
Basophils Absolute: 0.1 10*3/uL (ref 0.0–0.1)
Basophils Relative: 1 %
Eosinophils Absolute: 0.1 10*3/uL (ref 0.0–0.5)
Eosinophils Relative: 0 %
HCT: 26.3 % — ABNORMAL LOW (ref 39.0–52.0)
Hemoglobin: 7.8 g/dL — ABNORMAL LOW (ref 13.0–17.0)
Immature Granulocytes: 10 %
Lymphocytes Relative: 14 %
Lymphs Abs: 3.5 10*3/uL (ref 0.7–4.0)
MCH: 27.6 pg (ref 26.0–34.0)
MCHC: 29.7 g/dL — ABNORMAL LOW (ref 30.0–36.0)
MCV: 92.9 fL (ref 80.0–100.0)
Monocytes Absolute: 1.4 10*3/uL — ABNORMAL HIGH (ref 0.1–1.0)
Monocytes Relative: 5 %
Neutro Abs: 18 10*3/uL — ABNORMAL HIGH (ref 1.7–7.7)
Neutrophils Relative %: 70 %
Platelets: 230 10*3/uL (ref 150–400)
RBC: 2.83 MIL/uL — ABNORMAL LOW (ref 4.22–5.81)
RDW: 18.4 % — ABNORMAL HIGH (ref 11.5–15.5)
WBC: 25.5 10*3/uL — ABNORMAL HIGH (ref 4.0–10.5)
nRBC: 0.2 % (ref 0.0–0.2)

## 2019-07-22 LAB — HEPARIN LEVEL (UNFRACTIONATED): Heparin Unfractionated: 0.66 IU/mL (ref 0.30–0.70)

## 2019-07-22 LAB — PHOSPHORUS: Phosphorus: 4.3 mg/dL (ref 2.5–4.6)

## 2019-07-22 LAB — MAGNESIUM: Magnesium: 2.1 mg/dL (ref 1.7–2.4)

## 2019-07-22 MED ORDER — SODIUM CHLORIDE 0.9% FLUSH
10.0000 mL | Freq: Two times a day (BID) | INTRAVENOUS | Status: DC
  Administered 2019-07-24 – 2019-07-28 (×3): 10 mL

## 2019-07-22 MED ORDER — SODIUM CHLORIDE 0.9% FLUSH: 10.0000 mL | INTRAVENOUS | Status: DC | PRN

## 2019-07-22 NOTE — Plan of Care (Signed)
  Problem: Clinical Measurements: Goal: Ability to maintain clinical measurements within normal limits will improve Outcome: Progressing   
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
## 2019-07-22 NOTE — Progress Notes (Signed)
ANTICOAGULATION CONSULT NOTE - Follow Up Consult  Pharmacy Consult for heparin Indication: h/o VTE  Labs: Recent Labs    07/20/19 0407 07/21/19 0419 07/22/19 0456  HGB  --  8.1* 7.8*  HCT  --  26.8* 26.3*  PLT  --  228 230  LABPROT 16.4*  --   --   INR 1.4*  --   --   HEPARINUNFRC 0.49 0.58 0.66  CREATININE 8.88* 8.42*  --     Assessment/Plan:  44yo male therapeutic on heparin after resumed post-op. Will continue gtt at current rate and confirm stable with additional level.   Wynona Neat, PharmD, BCPS  07/22/2019,5:33 AM

## 2019-07-22 NOTE — Progress Notes (Signed)
  [facility] Progress Note   Assessment/ Plan:   1. MSSA bacteremia: in setting of L hip hardware infection and R index finger osteo.  On Cefazolin.  TEE 6/23 no vegetation.  Repeat I and D 6/22  ID on board 2.ESRD continue PD--> Continue all 2.5% tonight. 3. Anemia: continue ESA, increased Aranesp 200 mcg q Sunday 4. CKD-MBD: binders and renal vitamins 5. Nutrition: needs more protein--> on prostat 6. Hypertension: reasonably well controlled, may need to add 4.25% if remains a little elevated 7.  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  Suprapubic pain- send UA and culture-- not done yet.  Leukocytosis up to 25 s/p surgery and with pred taper.  PD fluid is all clear without fibrin, dont' suspect peritonitis 10.  Dispo: pending  Subjective:    For OR today, has some suprapubic pain.     Objective:   BP 113/80 (BP Location: Right Arm)   Pulse 79   Temp 97.6 F (36.4 C)   Resp 18   Ht 5' 9.02" (1.753 m)   Wt 121.5 kg   SpO2 100%   BMI 39.54 kg/m   Physical Exam: Gen: pale, NAD CVS: RRR Resp: clear Abd:PD cath in place, slight tenderness suprapubic region Ext: no LE edema ACCESS: R IJ TDC removed MSK: L hip woundvac +  Labs: BMET Recent Labs  Lab 07/18/19 1232 07/19/19 0359 07/20/19 0407 07/20/19 0819 07/21/19 0419 07/22/19 0456  NA 135 134* 135  --  135 138  K 3.1* 2.9* 2.9*  --  3.6 3.7  CL 96* 94* 99  --  99 101  CO2 23 26 22   --  24 23  GLUCOSE 147* 176* 95  --  268* 157*  BUN 44* 43* 49*  --  40* 46*  CREATININE 8.69* 8.58* 8.88*  --  8.42* 9.32*  CALCIUM 8.8* 9.0 8.7*  --  8.8* 9.2  PHOS 5.3* 4.6 4.3 3.9 2.8 4.3   CBC Recent Labs  Lab 07/17/19 0718 07/18/19 0408 07/21/19 0419 07/22/19 0456  WBC 16.8* 21.9* 21.7* 25.5*  NEUTROABS  --   --  19.3* 18.0*  HGB 8.0* 7.6* 8.1* 7.8*  HCT 26.0* 24.8* 26.8* 26.3*  MCV 90.0 89.9 92.7 92.9  PLT 256 235 228 230      Medications:    .  amLODipine  5 mg Oral Daily  . atorvastatin  40 mg Oral q1800  . buPROPion  150 mg Oral BID WC  . Chlorhexidine Gluconate Cloth  6 each Topical Daily  . [START ON 07/23/2019] darbepoetin (ARANESP) injection - NON-DIALYSIS  200 mcg Subcutaneous Q Sun-1800  . feeding supplement (PRO-STAT SUGAR FREE 64)  30 mL Oral BID  . gentamicin cream  1 application Topical Daily  . insulin aspart  0-9 Units Subcutaneous Q6H  . insulin detemir  5 Units Subcutaneous Daily  . metoprolol tartrate  12.5 mg Oral BID  . [START ON 07/23/2019] predniSONE  10 mg Oral TID with meals  . [START ON 07/25/2019] predniSONE  10 mg Oral BID WC  . [START ON 07/27/2019] predniSONE  10 mg Oral Q breakfast  . predniSONE  20 mg Oral BID WC  . pregabalin  50 mg Oral QHS  . sevelamer carbonate  2,400 mg Oral TID WC  . sodium chloride flush  10-40 mL Intracatheter Q12H     Madelon Lips MD 07/22/2019, 12:56 PM

## 2019-07-22 NOTE — Progress Notes (Signed)
ANTICOAGULATION CONSULT NOTE - Follow Up Consult  Pharmacy Consult for Heparin Indication: h/o DVT, recent CVA  Allergies  Allergen Reactions  . Doxycycline Hives  . Latex Swelling    Pt reports swelling at site.   . Morphine And Related Anxiety    Patient Measurements: Height: 5' 9.02" (175.3 cm) Weight: 121.5 kg (267 lb 13.7 oz) IBW/kg (Calculated) : 70.74 Heparin Dosing Weight:    Vital Signs: Temp: 97.6 F (36.4 C) (06/26 0517) BP: 121/78 (06/26 0517) Pulse Rate: 81 (06/26 0517)  Labs: Recent Labs    07/20/19 0407 07/21/19 0419 07/22/19 0456  HGB  --  8.1* 7.8*  HCT  --  26.8* 26.3*  PLT  --  228 230  LABPROT 16.4*  --   --   INR 1.4*  --   --   HEPARINUNFRC 0.49 0.58 0.66  CREATININE 8.88* 8.42* 9.32*    Estimated Creatinine Clearance: 13.2 mL/min (A) (by C-G formula based on SCr of 9.32 mg/dL (H)).   Assessment:  Anticoag: PTA warfarin for hx DVT. 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 Orthopedic surgery to dictate when to start warfarin--s/p finger amputation 6/25. Hep resumed post-op. Hep level 0.66. Hg b 7.8 low but stable. Plts WNL. - s/p Vit K 5 mg IV x 1 on 6/18 0037  Goal of Therapy:  Heparin level 0.3-0.7 units/ml Monitor platelets by anticoagulation protocol: Yes   Plan:  Heparin at 2000 units/hr All previous levels have been in goal on this rate. Will not order additional level today. Daily HL and CBC Resume Coumadin?   Astou Lada S. Alford Highland, PharmD, BCPS Clinical Staff Pharmacist [email or web address] Alford Highland, [employer] 07/22/2019,7:18 AM

## 2019-07-23 LAB — COMPREHENSIVE METABOLIC PANEL
ALT: <5 U/L (ref 0–44)
AST: 10 U/L — ABNORMAL LOW (ref 15–41)
Albumin: 1.7 g/dL — ABNORMAL LOW (ref 3.5–5.0)
Alkaline Phosphatase: 69 U/L (ref 38–126)
Anion gap: 11 (ref 5–15)
BUN: 42 mg/dL — ABNORMAL HIGH (ref 6–20)
CO2: 26 mmol/L (ref 22–32)
Calcium: 9 mg/dL (ref 8.9–10.3)
Chloride: 100 mmol/L (ref 98–111)
Creatinine, Ser: 8.79 mg/dL — ABNORMAL HIGH (ref 0.61–1.24)
GFR calc Af Amer: 8 mL/min — ABNORMAL LOW (ref >60)
GFR calc non Af Amer: 7 mL/min — ABNORMAL LOW (ref >60)
Glucose, Bld: 289 mg/dL — ABNORMAL HIGH (ref 70–99)
Potassium: 3.9 mmol/L (ref 3.5–5.1)
Sodium: 137 mmol/L (ref 135–145)
Total Bilirubin: 0.5 mg/dL (ref 0.3–1.2)
Total Protein: 6.2 g/dL — ABNORMAL LOW (ref 6.5–8.1)

## 2019-07-23 LAB — CBC WITH DIFFERENTIAL/PLATELET
Abs Immature Granulocytes: 1.78 10*3/uL — ABNORMAL HIGH (ref 0.00–0.07)
Basophils Absolute: 0.1 10*3/uL (ref 0.0–0.1)
Basophils Relative: 0 %
Eosinophils Absolute: 0 10*3/uL (ref 0.0–0.5)
Eosinophils Relative: 0 %
HCT: 27.2 % — ABNORMAL LOW (ref 39.0–52.0)
Hemoglobin: 7.9 g/dL — ABNORMAL LOW (ref 13.0–17.0)
Immature Granulocytes: 8 %
Lymphocytes Relative: 10 %
Lymphs Abs: 2 10*3/uL (ref 0.7–4.0)
MCH: 27.5 pg (ref 26.0–34.0)
MCHC: 29 g/dL — ABNORMAL LOW (ref 30.0–36.0)
MCV: 94.8 fL (ref 80.0–100.0)
Monocytes Absolute: 0.8 10*3/uL (ref 0.1–1.0)
Monocytes Relative: 4 %
Neutro Abs: 16.7 10*3/uL — ABNORMAL HIGH (ref 1.7–7.7)
Neutrophils Relative %: 78 %
Platelets: 209 10*3/uL (ref 150–400)
RBC: 2.87 MIL/uL — ABNORMAL LOW (ref 4.22–5.81)
RDW: 18.6 % — ABNORMAL HIGH (ref 11.5–15.5)
WBC: 21.4 10*3/uL — ABNORMAL HIGH (ref 4.0–10.5)
nRBC: 0.2 % (ref 0.0–0.2)

## 2019-07-23 LAB — URINE CULTURE: Culture: NO GROWTH

## 2019-07-23 LAB — HEPARIN LEVEL (UNFRACTIONATED)
Heparin Unfractionated: 0.95 IU/mL — ABNORMAL HIGH (ref 0.30–0.70)
Heparin Unfractionated: 0.93 IU/mL — ABNORMAL HIGH (ref 0.30–0.70)

## 2019-07-23 LAB — GLUCOSE, CAPILLARY
Glucose-Capillary: 212 mg/dL — ABNORMAL HIGH (ref 70–99)
Glucose-Capillary: 217 mg/dL — ABNORMAL HIGH (ref 70–99)
Glucose-Capillary: 246 mg/dL — ABNORMAL HIGH (ref 70–99)
Glucose-Capillary: 269 mg/dL — ABNORMAL HIGH (ref 70–99)

## 2019-07-23 LAB — PHOSPHORUS: Phosphorus: 3.6 mg/dL (ref 2.5–4.6)

## 2019-07-23 LAB — MAGNESIUM: Magnesium: 2 mg/dL (ref 1.7–2.4)

## 2019-07-23 MED ORDER — INSULIN ASPART 100 UNIT/ML ~~LOC~~ SOLN
3.0000 [IU] | Freq: Three times a day (TID) | SUBCUTANEOUS | Status: DC
  Administered 2019-07-24 – 2019-07-25 (×4): 3 [IU] via SUBCUTANEOUS

## 2019-07-23 MED ORDER — INSULIN DETEMIR 100 UNIT/ML ~~LOC~~ SOLN
10.0000 [IU] | Freq: Every day | SUBCUTANEOUS | Status: DC
  Administered 2019-07-24 – 2019-07-28 (×5): 10 [IU] via SUBCUTANEOUS
  Filled 2019-07-23 (×5): qty 0.1

## 2019-07-23 NOTE — Progress Notes (Signed)
ANTICOAGULATION CONSULT NOTE - Follow Up Consult  Pharmacy Consult for Heparin Indication: h/o DVT, recent CVA   Anticoag: PTA warfarin for hx DVT. 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 Orthopedic surgery to dictate when to start warfarin--s/p finger amputation 6/25. Hep resumed post-op.  Heparin level this am 0.95 units/ml  Goal of Therapy:  Heparin level 0.3-0.7 units/ml Monitor platelets by anticoagulation protocol: Yes   Plan:  Decrease heparin to 1800 units/hr Check heparin level 8 hours after rate change Daily HL and CBC Resume Coumadin?  Thanks for allowing pharmacy to be a part of this patient's care.  Excell Seltzer, PharmD Clinical Pharmacist
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
## 2019-07-23 NOTE — Progress Notes (Addendum)
Max Pittman Progress Note   Assessment/ Plan:   1. MSSA bacteremia: in setting of L hip hardware infection and R index finger osteo.  On Cefazolin.  TEE 6/23 no vegetation.  Repeat I and D 6/22  ID on board 2.ESRD continue PD--> Continue all 2.5% tonight, this seems to be working well. 3. Anemia: continue ESA, increased Aranesp 200 mcg q Sunday 4. CKD-MBD: binders and renal vitamins 5. Nutrition: needs more protein--> on prostat 6. Hypertension: reasonably well controlled 7.  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  H/o DVT- on hep gtt--> possible transition to coumadin but will defer to primary 9.  Suprapubic pain- send UA and culture-- UA doesn't look grossly infected, culture pending.  Leukocytosis up to 25 s/p surgery and with pred taper.  PD fluid is all clear without fibrin, dont' suspect peritonitis 10.  Dispo: pending  Subjective:    Doing well after R index finger amp.  NO complaints.     Objective:   BP 137/73 (BP Location: Right Arm)   Pulse 80   Temp 98.1 F (36.7 C) (Oral)   Resp 16   Ht 5' 9.02" (1.753 m)   Wt 119.4 kg   SpO2 95%   BMI 38.85 kg/m   Physical Exam: Gen: pale, NAD CVS: RRR Resp: clear Abd:PD cath in place, slight tenderness suprapubic region Ext: no LE edema ACCESS: R IJ TDC removed MSK: L hip woundvac +  Labs: BMET Recent Labs  Lab 07/18/19 1232 07/19/19 0359 07/20/19 0407 07/20/19 0819 07/21/19 0419 07/22/19 0456 07/23/19 0430  NA 135 134* 135  --  135 138 137  K 3.1* 2.9* 2.9*  --  3.6 3.7 3.9  CL 96* 94* 99  --  99 101 100  CO2 23 26 22   --  24 23 26   GLUCOSE 147* 176* 95  --  268* 157* 289*  BUN 44* 43* 49*  --  40* 46* 42*  CREATININE 8.69* 8.58* 8.88*  --  8.42* 9.32* 8.79*  CALCIUM 8.8* 9.0 8.7*  --  8.8* 9.2 9.0  PHOS 5.3* 4.6 4.3 3.9 2.8 4.3 3.6   CBC Recent Labs  Lab 07/18/19 0408 07/21/19 0419 07/22/19 0456 07/23/19 0430  WBC 21.9* 21.7* 25.5* 21.4*  NEUTROABS  --  19.3*  18.0* 16.7*  HGB 7.6* 8.1* 7.8* 7.9*  HCT 24.8* 26.8* 26.3* 27.2*  MCV 89.9 92.7 92.9 94.8  PLT 235 228 230 209      Medications:    . amLODipine  5 mg Oral Daily  . atorvastatin  40 mg Oral q1800  . buPROPion  150 mg Oral BID WC  . Chlorhexidine Gluconate Cloth  6 each Topical Daily  . darbepoetin (ARANESP) injection - NON-DIALYSIS  200 mcg Subcutaneous Q Sun-1800  . feeding supplement (PRO-STAT SUGAR FREE 64)  30 mL Oral BID  . gentamicin cream  1 application Topical Daily  . insulin aspart  0-9 Units Subcutaneous Q6H  . insulin detemir  5 Units Subcutaneous Daily  . metoprolol tartrate  12.5 mg Oral BID  . predniSONE  10 mg Oral TID with meals  . [START ON 07/25/2019] predniSONE  10 mg Oral BID WC  . [START ON 07/27/2019] predniSONE  10 mg Oral Q breakfast  . pregabalin  50 mg Oral QHS  . sevelamer carbonate  2,400 mg Oral TID WC  . sodium chloride flush  10-40 mL Intracatheter Q12H     Madelon Lips MD 07/23/2019, 10:26  AM  
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
## 2019-07-23 NOTE — Progress Notes (Signed)
Patient ID: Max Pittman., male   DOB: 1975-09-03, 44 y.o.   MRN: [id number] Patient is seen and evaluated at bedside.  Patient has no pain in his right hand.  Bandages clean dry and intact.  I discussed with patient that we will continue to keep the hand covered clean and dry and I will plan to see him back in the office in 10 to 14 days if he is discharged in the interim.  At present juncture he is status post DIP fusion for an osteomyelitic focus about the right index finger and doing well.  I discussed with him all issues.  Blasa Raisch MD

## 2019-07-24 ENCOUNTER — Ambulatory Visit (INDEPENDENT_AMBULATORY_CARE_PROVIDER_SITE_OTHER): Payer: Medicare Other | Admitting: *Deleted

## 2019-07-24 DIAGNOSIS — I63019 Cerebral infarction due to thrombosis of unspecified vertebral artery: Secondary | ICD-10-CM

## 2019-07-24 LAB — CBC WITH DIFFERENTIAL/PLATELET
Abs Immature Granulocytes: 0 10*3/uL (ref 0.00–0.07)
Basophils Absolute: 0 10*3/uL (ref 0.0–0.1)
Basophils Relative: 0 %
Eosinophils Absolute: 0 10*3/uL (ref 0.0–0.5)
Eosinophils Relative: 0 %
HCT: 27.8 % — ABNORMAL LOW (ref 39.0–52.0)
Hemoglobin: 8.3 g/dL — ABNORMAL LOW (ref 13.0–17.0)
Lymphocytes Relative: 7 %
Lymphs Abs: 1.6 10*3/uL (ref 0.7–4.0)
MCH: 28.3 pg (ref 26.0–34.0)
MCHC: 29.9 g/dL — ABNORMAL LOW (ref 30.0–36.0)
MCV: 94.9 fL (ref 80.0–100.0)
Monocytes Absolute: 0.7 10*3/uL (ref 0.1–1.0)
Monocytes Relative: 3 %
Neutro Abs: 20.3 10*3/uL — ABNORMAL HIGH (ref 1.7–7.7)
Neutrophils Relative %: 90 %
Platelets: 204 10*3/uL (ref 150–400)
RBC: 2.93 MIL/uL — ABNORMAL LOW (ref 4.22–5.81)
RDW: 18.6 % — ABNORMAL HIGH (ref 11.5–15.5)
WBC: 22.5 10*3/uL — ABNORMAL HIGH (ref 4.0–10.5)
nRBC: 0 /100 WBC
nRBC: 0.2 % (ref 0.0–0.2)

## 2019-07-24 LAB — COMPREHENSIVE METABOLIC PANEL
ALT: <5 U/L (ref 0–44)
AST: 7 U/L — ABNORMAL LOW (ref 15–41)
Albumin: 1.8 g/dL — ABNORMAL LOW (ref 3.5–5.0)
Alkaline Phosphatase: 65 U/L (ref 38–126)
Anion gap: 12 (ref 5–15)
BUN: 44 mg/dL — ABNORMAL HIGH (ref 6–20)
CO2: 25 mmol/L (ref 22–32)
Calcium: 9 mg/dL (ref 8.9–10.3)
Chloride: 99 mmol/L (ref 98–111)
Creatinine, Ser: 8.74 mg/dL — ABNORMAL HIGH (ref 0.61–1.24)
GFR calc Af Amer: 8 mL/min — ABNORMAL LOW (ref >60)
GFR calc non Af Amer: 7 mL/min — ABNORMAL LOW (ref >60)
Glucose, Bld: 236 mg/dL — ABNORMAL HIGH (ref 70–99)
Potassium: 3.4 mmol/L — ABNORMAL LOW (ref 3.5–5.1)
Sodium: 136 mmol/L (ref 135–145)
Total Bilirubin: 0.6 mg/dL (ref 0.3–1.2)
Total Protein: 6.4 g/dL — ABNORMAL LOW (ref 6.5–8.1)

## 2019-07-24 LAB — GLUCOSE, CAPILLARY
Glucose-Capillary: 176 mg/dL — ABNORMAL HIGH (ref 70–99)
Glucose-Capillary: 180 mg/dL — ABNORMAL HIGH (ref 70–99)
Glucose-Capillary: 214 mg/dL — ABNORMAL HIGH (ref 70–99)
Glucose-Capillary: 271 mg/dL — ABNORMAL HIGH (ref 70–99)

## 2019-07-24 LAB — PHOSPHORUS: Phosphorus: 4.1 mg/dL (ref 2.5–4.6)

## 2019-07-24 LAB — MAGNESIUM: Magnesium: 2 mg/dL (ref 1.7–2.4)

## 2019-07-24 LAB — HEPARIN LEVEL (UNFRACTIONATED): Heparin Unfractionated: 0.74 IU/mL — ABNORMAL HIGH (ref 0.30–0.70)

## 2019-07-24 NOTE — Progress Notes (Signed)
[facility] Progress Note   Assessment/ Plan:   1. MSSA bacteremia: in setting of L hip hardware infection and R index finger osteo.  On Cefazolin.  TEE 6/23 no vegetation.  Repeat I and D 6/22  ID on board  2.ESRD continue PD--> Continue all 2.5% tonight, this seems to be working well.   3. Anemia of CKD : continue ESA, increased Aranesp 200 mcg q Sunday  4. CKD-MBD: binders and renal vitamins  5. Hypertension: acceptable control   6.  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  H/o DVT- on hep gtt--> anticoagulation per primary team   8. Nutrition: needs more protein--> on prostat  9.  Suprapubic pain-  UA doesn't look grossly infected, culture with no growth.  Leukocytosis up to 25 s/p surgery and with pred taper.  PD fluid is all clear without fibrin, don't suspect peritonitis   10.  Dispo: per primary team   Subjective:    Had 200 mL uop charted over 6/27.  No UF charted for today yet as he is still hooked up and cannot locate prior recent charting.  States PD has been going well.  Not sure when he'll be going home.  Uses 2.5% dextrose at home most of the time  Review of systems:  Denies shortness of breath or chest pain  Denies n/v   Objective:   BP 140/86 (BP Location: Right Arm)   Pulse 76   Temp 97.7 F (36.5 C) (Oral)   Resp 18   Ht 5' 9.02" (1.753 m)   Wt 119.4 kg   SpO2 96%   BMI 38.85 kg/m   Physical Exam:  Gen: adult male in bed in NAD CVS: RRR Resp: clear Abd soft obese habitus nontender Ext: no LE edema ACCESS: PD catheter in place - bandage c/d/i  Neuro - alert and oriented x 3 follows commands and provides hx Psych - normal mood and affect  Labs: BMET Recent Labs  Lab 07/18/19 1232 07/18/19 1232 07/19/19 0359 07/20/19 0407 07/20/19 0819 07/21/19 0419 07/22/19 0456 07/23/19 0430 07/24/19 0500  NA 135  --  134* 135  --  135 138 137 136  K 3.1*  --  2.9* 2.9*  --  3.6 3.7 3.9 3.4*  CL 96*  --  94*  99  --  99 101 100 99  CO2 23  --  26 22  --  24 23 26 25   GLUCOSE 147*  --  176* 95  --  268* 157* 289* 236*  BUN 44*  --  43* 49*  --  40* 46* 42* 44*  CREATININE 8.69*  --  8.58* 8.88*  --  8.42* 9.32* 8.79* 8.74*  CALCIUM 8.8*  --  9.0 8.7*  --  8.8* 9.2 9.0 9.0  PHOS 5.3*   < > 4.6 4.3 3.9 2.8 4.3 3.6 4.1   < > = values in this interval not displayed.   CBC Recent Labs  Lab 07/21/19 0419 07/22/19 0456 07/23/19 0430 07/24/19 0500  WBC 21.7* 25.5* 21.4* 22.5*  NEUTROABS 19.3* 18.0* 16.7* 20.3*  HGB 8.1* 7.8* 7.9* 8.3*  HCT 26.8* 26.3* 27.2* 27.8*  MCV 92.7 92.9 94.8 94.9  PLT 228 230 209 204      Medications:    . amLODipine  5 mg Oral Daily  . atorvastatin  40 mg Oral q1800  . buPROPion  150 mg Oral BID WC  . Chlorhexidine Gluconate Cloth  6 each  Topical Daily  . darbepoetin (ARANESP) injection - NON-DIALYSIS  200 mcg Subcutaneous Q Sun-1800  . feeding supplement (PRO-STAT SUGAR FREE 64)  30 mL Oral BID  . gentamicin cream  1 application Topical Daily  . insulin aspart  0-9 Units Subcutaneous Q6H  . insulin aspart  3 Units Subcutaneous TID WC  . insulin detemir  10 Units Subcutaneous Daily  . metoprolol tartrate  12.5 mg Oral BID  . predniSONE  10 mg Oral TID with meals  . [START ON 07/25/2019] predniSONE  10 mg Oral BID WC  . [START ON 07/27/2019] predniSONE  10 mg Oral Q breakfast  . pregabalin  50 mg Oral QHS  . sevelamer carbonate  2,400 mg Oral TID WC  . sodium chloride flush  10-40 mL Intracatheter Q12H    [name] MD 07/24/2019, 8:00 AM

## 2019-07-24 NOTE — Progress Notes (Signed)
ANTICOAGULATION CONSULT NOTE - Follow Up Consult  Pharmacy Consult for Heparin Indication: h/o DVT, recent CVA   Anticoag: PTA warfarin for hx DVT. 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 Orthopedic surgery to dictate when to start warfarin--s/p finger amputation 6/25. Hep resumed post-op.  Heparin level this am 0.74 units/ml  Goal of Therapy:  Heparin level 0.3-0.7 units/ml Monitor platelets by anticoagulation protocol: Yes   Plan:  Decrease heparin to 1700 units/hr Daily HL and CBC Resume Coumadin?  Thanks for allowing pharmacy to be a part of this patient's care.  Excell Seltzer, PharmD Clinical Pharmacist

## 2019-07-24 NOTE — Plan of Care (Signed)
  Problem: Clinical Measurements: Goal: Ability to maintain clinical measurements within normal limits will improve Outcome: Completed/Met

## 2019-07-24 NOTE — Progress Notes (Signed)
Inpatient Rehabilitation-Admissions Coordinator   Met with pt bedside. He states he would prefer to DC home over CIR. Pt appears to be at baseline from when he discharged home from CIR back on 5/22. Pt reports good social support from his girlfriend and he confirmed she can assist him at this current functional level. AC will sign off.   Raechel Ache, OTR/L  Rehab Admissions Coordinator  [phone] 07/24/2019 11:02 AM

## 2019-07-24 NOTE — Progress Notes (Signed)
Physical Therapy Treatment Patient Details Name: Max Pittman. MRN: [id number] DOB: 03-10-1975 Today's Date: 07/24/2019    History of Present Illness 44 y.o. male with medical history significant for ESRD on peritoneal dialysis, diabetes mellitus, hypertension, DVT. Pt with L hip surgery in April, having persistent pain in this hip since, unable to walk recently due to pain. 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 Pt also hospitalized 6/4-10 for respiratory failure. Pt with L hip fluid collection, undergoing I&D L hip on 6/18. 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, Now status post DIP fusion for an osteomyelitic focus about the right index finger     PT Comments    Continuing work on functional mobility and activity tolerance;  Max Pittman required lots of encouragement to participate and walk today, and ultimately he agreed to walk in the room and get back to bed (to continue his nap); Noted he is opting for going home; will update, and work towards maximizing independence and safety for dc home   Follow Up Recommendations  Home health PT     Equipment Recommendations  None recommended by PT    Recommendations for Other Services       Precautions / Restrictions Precautions Precautions: Fall;Posterior Hip Restrictions RLE Weight Bearing: Weight bearing as tolerated LLE Weight Bearing: Partial weight bearing LLE Partial Weight Bearing Percentage or Pounds: 50    Mobility  Bed Mobility Overal bed mobility: Needs Assistance Bed Mobility: Supine to Sit;Sit to Supine     Supine to sit: Min guard Sit to supine: Min guard   General bed mobility comments: Use fo rail, no physical assist needed  Transfers Overall transfer level: Needs assistance Equipment used: Rolling walker (2 wheeled) Transfers: Sit to/from Stand Sit to Stand: Min guard (without physical contact)         General  transfer comment: Good rise and hand placement on RW  Ambulation/Gait Ambulation/Gait assistance: Min guard Gait Distance (Feet): 22 Feet Assistive device: Rolling walker (2 wheeled) Gait Pattern/deviations: Step-to pattern Gait velocity: reduced   General Gait Details: Heavy dependence on RW, but overall good steps; trunk slightly flexed   Stairs             Wheelchair Mobility    Modified Rankin (Stroke Patients Only)       Balance     Sitting balance-Leahy Scale: Good       Standing balance-Leahy Scale: Poor Standing balance comment: requires B UE support on RW to maintain balance and decrease anxiety                            Cognition Arousal/Alertness: Awake/alert Behavior During Therapy: WFL for tasks assessed/performed;Anxious Overall Cognitive Status: Within Functional Limits for tasks assessed                                        Exercises      General Comments        Pertinent Vitals/Pain Pain Assessment: Faces Faces Pain Scale: Hurts a little bit Pain Location: L hip Pain Descriptors / Indicators: Aching;Grimacing;Sore Pain Intervention(s): Monitored during session    Home Living                      Prior Function  PT Goals (current goals can now be found in the care plan section) Acute Rehab PT Goals Patient Stated Goal: do whatever it takes to get better and go home, pain control PT Goal Formulation: With patient Time For Goal Achievement: 07/30/19 Potential to Achieve Goals: Good Progress towards PT goals: Progressing toward goals    Frequency    Min 3X/week      PT Plan Discharge plan needs to be updated    Co-evaluation              AM-PAC PT "6 Clicks" Mobility   Outcome Measure  Help needed turning from your back to your side while in a flat bed without using bedrails?: A Little Help needed moving from lying on your back to sitting on the side of a flat  bed without using bedrails?: A Little Help needed moving to and from a bed to a chair (including a wheelchair)?: A Little Help needed standing up from a chair using your arms (e.g., wheelchair or bedside chair)?: A Little Help needed to walk in hospital room?: A Little Help needed climbing 3-5 steps with a railing? : A Lot 6 Click Score: 17    End of Session Equipment Utilized During Treatment: Gait belt Activity Tolerance: Patient tolerated treatment well Patient left: in bed;with call bell/phone within reach Nurse Communication: Mobility status PT Visit Diagnosis: Pain;Other (comment);Muscle weakness (generalized) (M62.81);Difficulty in walking, not elsewhere classified (R26.2) Pain - Right/Left: Left Pain - part of body: Leg     Time: 7124-5809 PT Time Calculation (min) (ACUTE ONLY): 16 min  Charges:  $Gait Training: 8-22 mins                     Roney Marion, PT  Acute Rehabilitation Services Pager [phone] Office [place] 07/24/2019, 6:37 PM

## 2019-07-24 NOTE — Progress Notes (Signed)
Inpatient Diabetes Program Recommendations  AACE/ADA: New Consensus Statement on Inpatient Glycemic Control  Target Ranges:  Prepandial:   less than 140 mg/dL      Peak postprandial:   less than 180 mg/dL (1-2 hours)      Critically ill patients:  140 - 180 mg/dL   Results for Max Pittman, Max Pittman (MRN [id number]) as of 07/24/2019 10:38  Ref. Range 07/23/2019 05:48 07/23/2019 12:03 07/23/2019 17:32 07/24/2019 00:08 07/24/2019 06:01  Glucose-Capillary Latest Ref Range: 70 - 99 mg/dL 269 (H) 212 (H) 217 (H) 271 (H) 214 (H)   Review of Glycemic Control  Diabetes history: DM2 Outpatient Diabetes medications: Actos 15 mg daily Current orders for Inpatient glycemic control: Levemir 10 units daily, Novolog 3 units TID with meals, Novolog 0-9 units Q6H; Prednisone taper  Inpatient Diabetes Program Recommendations:   Insulin - Basal: Noted Levemir increased from 5 to 10 units daily today.  Diet: Please consider discontinuing Regular diet and ordering Carb Mod Renal diet.  Thanks, Barnie Alderman, RN, MSN, CDE Diabetes Coordinator Inpatient Diabetes Program [phone] (Team Pager from 8am to 5pm)

## 2019-07-24 NOTE — Plan of Care (Signed)
  Problem: Pain Managment: Goal: General experience of comfort will improve Outcome: Progressing   Problem: Activity: Goal: Risk for activity intolerance will decrease Outcome: Progressing   

## 2019-07-24 NOTE — Progress Notes (Signed)
IV team RN showed this RN that pt has serosanguineous fluid under biopatch of R IJ CVC. IV team nurse changed biopatch and told this RN to inform the MD. RN spoke with MD on call to make aware and to inform MD that pt is on Heparin at 18 mL/hr.   Eleanora Neighbor, RN

## 2019-07-25 LAB — PROTIME-INR
INR: 1.1 (ref 0.8–1.2)
Prothrombin Time: 13.8 seconds (ref 11.4–15.2)

## 2019-07-25 LAB — COMPREHENSIVE METABOLIC PANEL
ALT: <5 U/L (ref 0–44)
AST: 11 U/L — ABNORMAL LOW (ref 15–41)
Albumin: 1.8 g/dL — ABNORMAL LOW (ref 3.5–5.0)
Alkaline Phosphatase: 63 U/L (ref 38–126)
Anion gap: 12 (ref 5–15)
BUN: 44 mg/dL — ABNORMAL HIGH (ref 6–20)
CO2: 26 mmol/L (ref 22–32)
Calcium: 9 mg/dL (ref 8.9–10.3)
Chloride: 100 mmol/L (ref 98–111)
Creatinine, Ser: 8.79 mg/dL — ABNORMAL HIGH (ref 0.61–1.24)
GFR calc Af Amer: 8 mL/min — ABNORMAL LOW (ref >60)
GFR calc non Af Amer: 7 mL/min — ABNORMAL LOW (ref >60)
Glucose, Bld: 203 mg/dL — ABNORMAL HIGH (ref 70–99)
Potassium: 3.2 mmol/L — ABNORMAL LOW (ref 3.5–5.1)
Sodium: 138 mmol/L (ref 135–145)
Total Bilirubin: 0.3 mg/dL (ref 0.3–1.2)
Total Protein: 6 g/dL — ABNORMAL LOW (ref 6.5–8.1)

## 2019-07-25 LAB — CBC WITH DIFFERENTIAL/PLATELET
Abs Immature Granulocytes: 1.23 10*3/uL — ABNORMAL HIGH (ref 0.00–0.07)
Basophils Absolute: 0.1 10*3/uL (ref 0.0–0.1)
Basophils Relative: 0 %
Eosinophils Absolute: 0 10*3/uL (ref 0.0–0.5)
Eosinophils Relative: 0 %
HCT: 28.3 % — ABNORMAL LOW (ref 39.0–52.0)
Hemoglobin: 8.5 g/dL — ABNORMAL LOW (ref 13.0–17.0)
Immature Granulocytes: 6 %
Lymphocytes Relative: 13 %
Lymphs Abs: 2.7 10*3/uL (ref 0.7–4.0)
MCH: 28.3 pg (ref 26.0–34.0)
MCHC: 30 g/dL (ref 30.0–36.0)
MCV: 94.3 fL (ref 80.0–100.0)
Monocytes Absolute: 1.3 10*3/uL — ABNORMAL HIGH (ref 0.1–1.0)
Monocytes Relative: 7 %
Neutro Abs: 14.6 10*3/uL — ABNORMAL HIGH (ref 1.7–7.7)
Neutrophils Relative %: 74 %
Platelets: 180 10*3/uL (ref 150–400)
RBC: 3 MIL/uL — ABNORMAL LOW (ref 4.22–5.81)
RDW: 18.7 % — ABNORMAL HIGH (ref 11.5–15.5)
WBC: 19.9 10*3/uL — ABNORMAL HIGH (ref 4.0–10.5)
nRBC: 0.5 % — ABNORMAL HIGH (ref 0.0–0.2)

## 2019-07-25 LAB — MAGNESIUM: Magnesium: 1.9 mg/dL (ref 1.7–2.4)

## 2019-07-25 LAB — PHOSPHORUS: Phosphorus: 3.9 mg/dL (ref 2.5–4.6)

## 2019-07-25 LAB — CUP PACEART REMOTE DEVICE CHECK
Date Time Interrogation Session: 20210626201804
Implantable Pulse Generator Implant Date: 20210429

## 2019-07-25 LAB — GLUCOSE, CAPILLARY
Glucose-Capillary: 123 mg/dL — ABNORMAL HIGH (ref 70–99)
Glucose-Capillary: 145 mg/dL — ABNORMAL HIGH (ref 70–99)
Glucose-Capillary: 177 mg/dL — ABNORMAL HIGH (ref 70–99)
Glucose-Capillary: 235 mg/dL — ABNORMAL HIGH (ref 70–99)

## 2019-07-25 LAB — HEPARIN LEVEL (UNFRACTIONATED): Heparin Unfractionated: 0.45 IU/mL (ref 0.30–0.70)

## 2019-07-25 MED ORDER — POTASSIUM CHLORIDE CRYS ER 20 MEQ PO TBCR
20.0000 meq | EXTENDED_RELEASE_TABLET | ORAL | Status: AC
  Administered 2019-07-25 (×2): 20 meq via ORAL
  Filled 2019-07-25 (×2): qty 1

## 2019-07-25 MED ORDER — DARBEPOETIN ALFA 200 MCG/0.4ML IJ SOSY: 200.0000 ug | PREFILLED_SYRINGE | INTRAMUSCULAR | 0 refills | Status: DC

## 2019-07-25 MED ORDER — GENTAMICIN SULFATE 0.1 % EX CREA: 1.0000 "application " | TOPICAL_CREAM | Freq: Every day | CUTANEOUS | 0 refills | Status: DC

## 2019-07-25 MED ORDER — POLYETHYLENE GLYCOL 3350 17 G PO PACK: 17.0000 g | PACK | Freq: Every day | ORAL | 0 refills | Status: DC | PRN

## 2019-07-25 MED ORDER — PREDNISONE 10 MG PO TABS: 10.0000 mg | ORAL_TABLET | Freq: Two times a day (BID) | ORAL | 0 refills | Status: DC

## 2019-07-25 MED ORDER — CEFAZOLIN SODIUM-DEXTROSE 1-4 GM/50ML-% IV SOLN: 1.0000 g | INTRAVENOUS | 0 refills | Status: DC

## 2019-07-25 MED ORDER — WARFARIN SODIUM 4 MG PO TABS
4.0000 mg | ORAL_TABLET | Freq: Once | ORAL | Status: AC
  Administered 2019-07-25: 4 mg via ORAL
  Filled 2019-07-25: qty 1

## 2019-07-25 MED ORDER — INSULIN ASPART 100 UNIT/ML ~~LOC~~ SOLN
6.0000 [IU] | Freq: Three times a day (TID) | SUBCUTANEOUS | Status: DC
  Administered 2019-07-25 – 2019-07-27 (×8): 6 [IU] via SUBCUTANEOUS

## 2019-07-25 MED ORDER — PREDNISONE 10 MG PO TABS: 10.0000 mg | ORAL_TABLET | Freq: Every day | ORAL | 0 refills | Status: DC

## 2019-07-25 MED ORDER — POTASSIUM CHLORIDE CRYS ER 20 MEQ PO TBCR: 20.0000 meq | EXTENDED_RELEASE_TABLET | Freq: Once | ORAL | Status: DC

## 2019-07-25 MED ORDER — FUROSEMIDE 10 MG/ML IJ SOLN
60.0000 mg | Freq: Once | INTRAMUSCULAR | Status: AC
  Administered 2019-07-25: 60 mg via INTRAVENOUS
  Filled 2019-07-25: qty 6

## 2019-07-25 MED ORDER — WARFARIN - PHARMACIST DOSING INPATIENT: Freq: Every day | Status: DC

## 2019-07-25 MED ORDER — WARFARIN SODIUM 4 MG PO TABS
4.0000 mg | ORAL_TABLET | Freq: Once | ORAL | Status: DC
  Filled 2019-07-25: qty 1

## 2019-07-25 NOTE — Progress Notes (Signed)
[facility] Progress Note   Assessment/ Plan:   1. MSSA bacteremia: in setting of L hip hardware infection and R index finger osteo.  On Cefazolin.  TEE 6/23 no vegetation.  Repeat I and D 6/22  ID on board  2.ESRD continue PD--> Continue all 2.5% tonight, this seems to be working well.  Potassium 20 meq PO x 2 doses on 6/29.  Lasix 60 mg IV once on 6/29.   3. Anemia of CKD : continue ESA, increased Aranesp 200 mcg q Sunday  4. CKD-MBD: binders and renal vitamins  5. Hypertension: acceptable control   6.  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  H/o DVT- on hep gtt--> anticoagulation per primary team   8. Nutrition: needs more protein--> on prostat  9.  Suprapubic pain-  UA doesn't look grossly infected, culture with no growth.  Leukocytosis up to 25 s/p surgery and with pred taper.  PD fluid is all clear without fibrin, don't suspect peritonitis   10.  Dispo: per primary team   Subjective:    Spoke with HD charge RN on 6/28 - as PD UF hadn't been documented for several days.  UF for 6/28 was 1411 per her report.  CIR signed off.  He had 450 mL UOP over 6/28 as well as one unmeasured void.  States still waiting on anticoagulation per primary team for d/c - swelling has gotten better.  Thinks 2.5% going well.  His outpt nephro has considered restarting lasix.  Review of systems:   Denies shortness of breath or chest pain  Denies n/v   Objective:   BP 135/83 (BP Location: Right Arm)   Pulse 75   Temp 98.2 F (36.8 C)   Resp 18   Ht 5' 9.02" (1.753 m)   Wt 120.5 kg   SpO2 99%   BMI 39.21 kg/m   Physical Exam:  Gen: adult male in bed in NAD   CVS: S1S2 no rub Resp: clear and unlabored on room air Abd soft obese habitus nontender Ext: trace to 1+ LE edema ACCESS: PD catheter in place - bandage c/d/i  Neuro - alert and oriented x 3 follows commands and provides hx Psych - normal mood and affect  Labs: BMET Recent Labs  Lab  07/19/19 0359 07/19/19 0359 07/20/19 0407 07/20/19 0819 07/21/19 0419 07/22/19 0456 07/23/19 0430 07/24/19 0500 07/25/19 0454  NA 134*  --  135  --  135 138 137 136 138  K 2.9*  --  2.9*  --  3.6 3.7 3.9 3.4* 3.2*  CL 94*  --  99  --  99 101 100 99 100  CO2 26  --  22  --  24 23 26 25 26   GLUCOSE 176*  --  95  --  268* 157* 289* 236* 203*  BUN 43*  --  49*  --  40* 46* 42* 44* 44*  CREATININE 8.58*  --  8.88*  --  8.42* 9.32* 8.79* 8.74* 8.79*  CALCIUM 9.0  --  8.7*  --  8.8* 9.2 9.0 9.0 9.0  PHOS 4.6   < > 4.3 3.9 2.8 4.3 3.6 4.1 3.9   < > = values in this interval not displayed.   CBC Recent Labs  Lab 07/22/19 0456 07/23/19 0430 07/24/19 0500 07/25/19 0454  WBC 25.5* 21.4* 22.5* 19.9*  NEUTROABS 18.0* 16.7* 20.3* 14.6*  HGB 7.8* 7.9* 8.3* 8.5*  HCT 26.3* 27.2* 27.8* 28.3*  MCV  92.9 94.8 94.9 94.3  PLT 230 209 204 180      Medications:    . amLODipine  5 mg Oral Daily  . atorvastatin  40 mg Oral q1800  . buPROPion  150 mg Oral BID WC  . Chlorhexidine Gluconate Cloth  6 each Topical Daily  . darbepoetin (ARANESP) injection - NON-DIALYSIS  200 mcg Subcutaneous Q Sun-1800  . feeding supplement (PRO-STAT SUGAR FREE 64)  30 mL Oral BID  . gentamicin cream  1 application Topical Daily  . insulin aspart  0-9 Units Subcutaneous Q6H  . insulin aspart  3 Units Subcutaneous TID WC  . insulin detemir  10 Units Subcutaneous Daily  . metoprolol tartrate  12.5 mg Oral BID  . predniSONE  10 mg Oral TID with meals  . predniSONE  10 mg Oral BID WC  . [START ON 07/27/2019] predniSONE  10 mg Oral Q breakfast  . pregabalin  50 mg Oral QHS  . sevelamer carbonate  2,400 mg Oral TID WC  . sodium chloride flush  10-40 mL Intracatheter Q12H    [name] MD 07/25/2019, 6:46 AM

## 2019-07-25 NOTE — Progress Notes (Signed)
[facility] Summary Report / Loop Recorder 

## 2019-07-25 NOTE — Progress Notes (Addendum)
ANTICOAGULATION CONSULT NOTE - Follow Up Consult  Pharmacy Consult for Heparin Indication: h/o DVT, recent CVA   Anticoag: PTA warfarin for hx DVT. 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 Orthopedic surgery to dictate when to start warfarin--s/p finger amputation 6/25. Hep resumed post-op.  Heparin level this am 0.45 units/ml  Goal of Therapy:  Heparin level 0.3-0.7 units/ml Monitor platelets by anticoagulation protocol: Yes   Plan:  Continue heparin at 1700 units/hr Daily HL and CBC Resume Coumadin?  Max Pittman, PharmD, BCPS, FNKF Clinical Pharmacist [place] Please utilize Amion for appropriate phone number to reach the unit pharmacist ([place])  Addendum: Patient to restart warfarin today. Patient with recent clot (per patient) in last 6 months. D/W Dr. Sherral Hammers, will keep patient inpatient and bridge with heparin to therapeutic INR.  Will give 4mg  PO x 1. Daily INR  Max Pittman [name], PharmD, BCPS, FNKF Clinical Pharmacist [place] Please utilize Amion for appropriate phone number to reach the unit pharmacist ([place])

## 2019-07-25 NOTE — Progress Notes (Addendum)
Orthopaedic Trauma Service Progress Note  Patient ID: Max Pittman. MRN: [id number] DOB/AGE: 1975-07-24 44 y.o.  Subjective:  Doing ok  L hip and knee are stable Mild soreness but nothing too severe    Objective:   VITALS:   Vitals:   07/24/19 1718 07/24/19 2035 07/25/19 0432 07/25/19 1006  BP: (!) 148/85 139/79 135/83 138/86  Pulse: 76 94 75 85  Resp:  18 18 18   Temp: 98 F (36.7 C) 97.9 F (36.6 C) 98.2 F (36.8 C) 97.9 F (36.6 C)  TempSrc: Oral     SpO2: 96% 97% 99% 99%  Weight: 119.9 kg  120.5 kg   Height:        Estimated body mass index is 39.21 kg/m as calculated from the following:   Height as of this encounter: 5' 9.02" (1.753 m).   Weight as of this encounter: 120.5 kg.   Intake/Output      06/28 0701 - 06/29 0700 06/29 0701 - 06/30 0700   P.O. 1040 240   I.V. (mL/kg) 511.1 (4.2) 84.4 (0.7)   IV Piggyback 50 0   Total Intake(mL/kg) 1601.1 (13.3) 324.4 (2.7)   Urine (mL/kg/hr) 450 (0.2)    Emesis/NG output 0    Other 0    Stool 0    Blood 0    Total Output 450    Net +1151.1 +324.4        Urine Occurrence 1 x    Stool Occurrence 0 x    Emesis Occurrence 0 x      LABS  Results for orders placed or performed during the hospital encounter of 07/13/19 (from the past 24 hour(s))  Glucose, capillary     Status: Abnormal   Collection Time: 07/25/19 12:43 AM  Result Value Ref Range   Glucose-Capillary 235 (H) 70 - 99 mg/dL  Heparin level (unfractionated)     Status: None   Collection Time: 07/25/19  4:54 AM  Result Value Ref Range   Heparin Unfractionated 0.45 0.30 - 0.70 IU/mL  Comprehensive metabolic panel     Status: Abnormal   Collection Time: 07/25/19  4:54 AM  Result Value Ref Range   Sodium 138 135 - 145 mmol/L   Potassium 3.2 (L) 3.5 - 5.1 mmol/L   Chloride 100 98 - 111 mmol/L   CO2 26 22 - 32 mmol/L   Glucose, Bld 203 (H) 70 - 99 mg/dL   BUN 44 (H) 6  - 20 mg/dL   Creatinine, Ser 8.79 (H) 0.61 - 1.24 mg/dL   Calcium 9.0 8.9 - 10.3 mg/dL   Total Protein 6.0 (L) 6.5 - 8.1 g/dL   Albumin 1.8 (L) 3.5 - 5.0 g/dL   AST 11 (L) 15 - 41 U/L   ALT <5 0 - 44 U/L   Alkaline Phosphatase 63 38 - 126 U/L   Total Bilirubin 0.3 0.3 - 1.2 mg/dL   GFR calc non Af Amer 7 (L) >60 mL/min   GFR calc Af Amer 8 (L) >60 mL/min   Anion gap 12 5 - 15  Magnesium     Status: None   Collection Time: 07/25/19  4:54 AM  Result Value Ref Range   Magnesium 1.9 1.7 - 2.4 mg/dL  Phosphorus     Status: None   Collection Time: 07/25/19  4:54 AM  Result Value Ref Range   Phosphorus 3.9 2.5 - 4.6 mg/dL  CBC with Differential/Platelet     Status: Abnormal   Collection Time: 07/25/19  4:54 AM  Result Value Ref Range   WBC 19.9 (H) 4.0 - 10.5 K/uL   RBC 3.00 (L) 4.22 - 5.81 MIL/uL   Hemoglobin 8.5 (L) 13.0 - 17.0 g/dL   HCT 28.3 (L) 39 - 52 %   MCV 94.3 80.0 - 100.0 fL   MCH 28.3 26.0 - 34.0 pg   MCHC 30.0 30.0 - 36.0 g/dL   RDW 18.7 (H) 11.5 - 15.5 %   Platelets 180 150 - 400 K/uL   nRBC 0.5 (H) 0.0 - 0.2 %   Neutrophils Relative % 74 %   Neutro Abs 14.6 (H) 1.7 - 7.7 K/uL   Lymphocytes Relative 13 %   Lymphs Abs 2.7 0.7 - 4.0 K/uL   Monocytes Relative 7 %   Monocytes Absolute 1.3 (H) 0 - 1 K/uL   Eosinophils Relative 0 %   Eosinophils Absolute 0.0 0 - 0 K/uL   Basophils Relative 0 %   Basophils Absolute 0.1 0 - 0 K/uL   WBC Morphology MILD LEFT SHIFT (1-5% METAS, OCC MYELO, OCC BANDS)    Immature Granulocytes 6 %   Abs Immature Granulocytes 1.23 (H) 0.00 - 0.07 K/uL  Glucose, capillary     Status: Abnormal   Collection Time: 07/25/19  6:03 AM  Result Value Ref Range   Glucose-Capillary 177 (H) 70 - 99 mg/dL  Glucose, capillary     Status: Abnormal   Collection Time: 07/25/19 11:24 AM  Result Value Ref Range   Glucose-Capillary 123 (H) 70 - 99 mg/dL     PHYSICAL EXAM:   Gen: resting comfortably in bed, pleasant Ext:      Left Lower Extremity               mepilex dressings in place to L hip and are stable, scant strikethrough              Distal motor and sensory functions intact             Swelling stable             no knee effusion                          No erythema             No new areas of ecchymosis              No DCT              Compartments soft                 Assessment/Plan: 4 Days Post-Op   Principal Problem:   MSSA bacteremia Active Problems:   Peritoneal dialysis status (HCC)   Diabetes mellitus ([place])   Multiple closed pelvic fractures with disruption of pelvic circle (HCC)   Diabetic peripheral neuropathy (HCC)   Abscess of left hip   Sepsis (HCC)   Chronic anticoagulation   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)   S/P TAVR (transcatheter aortic valve replacement)   Anemia, unspecified   Anti-infectives (From admission, onward)   Start     Dose/Rate Route Frequency Ordered Stop   07/17/19 1413  tobramycin (NEBCIN) powder  Status:  Discontinued          As needed  07/17/19 1413 07/17/19 1434   07/17/19 1412  vancomycin (VANCOCIN) powder  Status:  Discontinued          As needed 07/17/19 1413 07/17/19 1434   07/15/19 1700  vancomycin (VANCOREADY) IVPB 1500 mg/300 mL  Status:  Discontinued        1,500 mg 150 mL/hr over 120 Minutes Intravenous Every 48 hours 07/13/19 1726 07/14/19 0845   07/14/19 1700  ceFEPIme (MAXIPIME) 2 g in sodium chloride 0.9 % 100 mL IVPB  Status:  Discontinued        2 g 200 mL/hr over 30 Minutes Intravenous Every 24 hours 07/13/19 1725 07/14/19 0845   07/14/19 1600  ceFAZolin (ANCEF) IVPB 1 g/50 mL premix     Discontinue     1 g 100 mL/hr over 30 Minutes Intravenous Every 24 hours 07/14/19 0859     07/14/19 1200  ceFAZolin (ANCEF) IVPB 2g/100 mL premix  Status:  Discontinued        2 g 200 mL/hr over 30 Minutes Intravenous Every M-W-F (Hemodialysis) 07/14/19 0846 07/14/19 0859   07/14/19 0200  metroNIDAZOLE (FLAGYL) IVPB 500 mg  Status:  Discontinued        500  mg 100 mL/hr over 60 Minutes Intravenous Every 8 hours 07/13/19 2250 07/14/19 0845   07/13/19 1730  ceFEPIme (MAXIPIME) 2 g in sodium chloride 0.9 % 100 mL IVPB        2 g 200 mL/hr over 30 Minutes Intravenous  Once 07/13/19 1715 07/13/19 1818   07/13/19 1730  metroNIDAZOLE (FLAGYL) IVPB 500 mg        500 mg 100 mL/hr over 60 Minutes Intravenous  Once 07/13/19 1715 07/13/19 1849   07/13/19 1730  vancomycin (VANCOCIN) IVPB 1000 mg/200 mL premix  Status:  Discontinued        1,000 mg 200 mL/hr over 60 Minutes Intravenous  Once 07/13/19 1715 07/13/19 1724   07/13/19 1730  vancomycin (VANCOREADY) IVPB 2000 mg/400 mL        2,000 mg 200 mL/hr over 120 Minutes Intravenous  Once 07/13/19 1724 07/13/19 2026    .  POD/HD#:8  44 y/o male with complex medical history including ESRD on peritoneal dialysis 7 weeks post op ORIF complex L acetabulum fracture dislocation with L thigh abscess    -Left thigh/gluteal abscess s/p serial I&D  mepilex dressing changes to L hip as needed   Can use 4x4s and tape in place of mepilex              No further plans to return to OR this admission for L thigh    - L acetabulum fracture s/p ORIF 7 weeks post op             Partial WB with therapy, 50% BW             Posterior hip precautions              As above               CT shows maintained alignment              Minimal callus around fx zones                xrays post fall looks stable   -L knee pain              improving   Complete course of prednisone    Prednisone 10 mg po BID today and tomorrow  Prednisone 10 mg po daily x 2 days starting 07/27/2019   - R index finger osteo s/p DIP amputation              Per hand surgery      - Pain management:             Continue with current regimen              + prednisone    - Medical issues              Per medicine and renal    - ID:              Ancef per ID      - Dispo:             Ortho issues stable             Follow up with  ortho trauma service on 08/02/2019 @2pm    Jari Pigg, PA-C [phone] (C) 07/25/2019, 12:06 PM  [facility] [place] [phone] Domingo Sep (F)

## 2019-07-25 NOTE — TOC Progression Note (Signed)
Transition of Care [facility]) - Progression Note    Patient Details  Name: Max Pittman. MRN: [id number] Date of Birth: Oct 27, 1975  [facility] CM/SW Contact  Aveion Nguyen, Edson Snowball, RN Phone Number: 07/25/2019, 10:47 AM  Clinical Narrative:     Spoke to patient at bedside. Confirmed he wants to go home with IV antibiotics with [facility].   Spoke to [employer] they prepared for discharge today .    Need HH RN,PT orders and face to face and OPAT script signed . MD aware  Expected Discharge Plan: [place] Barriers to Discharge: Continued Medical Work up  Expected Discharge Plan and Services Expected Discharge Plan: [place] In-house Referral: NA Discharge Planning Services: CM Consult Post Acute Care Choice: [place] arrangements for the past 2 months: Single Family Home                 DME Arranged: N/A DME Agency: NA       HH Arranged: RN, PT [place] Agency: [place] ([place]) Date HH Agency Contacted: 07/21/19 Time [place]: 1618 Representative spoke with at [place]: [place] ([place]) Interventions    Readmission Risk Interventions No flowsheet data found.

## 2019-07-25 NOTE — Progress Notes (Signed)
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 Normal biventricular function without evidence of hemodynamically significant valvular heart disease. No evidence  of vegetation/infective endocarditis on this transesophageal echocardiogram. FINDINGS  Left Ventricle: Left ventricular ejection fraction, by estimation, is 60 to 65%. The left ventricle has normal function. The left ventricle has no regional wall motion abnormalities. The left ventricular internal cavity size was normal in size. There is  no left ventricular hypertrophy. Right Ventricle: The right ventricular size is normal. No increase in right ventricular wall thickness. Right ventricular systolic function is normal. Left Atrium: Left atrial size was normal in size. No left atrial/left atrial appendage thrombus was detected. Right Atrium: Right atrial size was normal in size. Pericardium:  There is no evidence of pericardial effusion. Mitral Valve: The mitral valve is normal in structure. Normal mobility of the mitral valve leaflets. Mild mitral valve regurgitation. No evidence of mitral valve stenosis. Tricuspid Valve: The tricuspid valve is normal in structure. Tricuspid valve regurgitation is mild . No evidence of tricuspid stenosis. Aortic Valve: The aortic valve is normal in structure. Aortic valve regurgitation is not visualized. No aortic stenosis is present. Pulmonic Valve: The pulmonic valve was normal in structure. Pulmonic valve regurgitation is trivial. No evidence of pulmonic stenosis. Aorta: The aortic root is normal in size and structure. Venous: The inferior vena cava is normal in size with greater than 50% respiratory variability, suggesting right atrial pressure of 3 mmHg. IAS/Shunts: No atrial level shunt detected by color flow Doppler. Lipomatous interatrial septum with thin hypermobile mid section. There is no evidence of shunting by colorflow doppler. Fransico Him MD Electronically signed by Fransico Him MD Signature Date/Time: 07/20/2019/12:29:08 AM    Final    CUP PACEART REMOTE DEVICE CHECK  Result Date: 07/25/2019 [facility] summary report received. Battery status OK. Normal device function. No new symptom episodes, tachy episodes, brady, or pause episodes. No new AF episodes. Monthly summary reports and ROV/PRN  DG Hip Unilat W or Wo Pelvis 2-3 Views Left  Result Date: 07/13/2019 CLINICAL DATA:  Pain. Previous surgical repair for acetabular fracture EXAM: DG HIP (WITH OR WITHOUT PELVIS) 2-3V LEFT COMPARISON:  CT left hip Jun 16, 2019 FINDINGS: Frontal pelvis as well as frontal and lateral left hip images were obtained. There is screw and plate fixation through acetabular fractures on the left with alignment near anatomic. No acute fracture or dislocation. There is mild symmetric narrowing of each hip joint. No erosive change. IMPRESSION: Postoperative change left hip  joint with near anatomic alignment at fracture fracture sites. Note very little callus formation in visualized fracture site areas. No acute fracture or dislocation. Symmetric narrowing each hip joint. Electronically Signed   By: Lowella Grip III M.D.   On: 07/13/2019 18:33   ECHOCARDIOGRAM LIMITED  Result Date: 07/19/2019    ECHOCARDIOGRAM LIMITED REPORT   Patient Name:   Max Pittman. Date of Exam: 07/15/2019 Medical Rec #:  [id number]            Height:       69.0 in Accession #:    [id number]           Weight:       268.7 lb Date of Birth:  [birth year]            BSA:          2.342 m Patient Age:    86 years             BP:           119/74 mmHg Patient Gender: M  HR:           103 bpm. Exam Location:  Inpatient Procedure: Limited Color Doppler and Cardiac Doppler Indications:     bacteremia 790.7  History:         Patient has prior history of Echocardiogram examinations, most                  recent 05/25/2019. End stage renal disease. History of stroke.                  Sepsis., Signs/Symptoms:Bacteremia; Risk Factors:Diabetes and                  Hypertension.  Sonographer:     Johny Chess Referring Phys:  [id number] Ainsley Spinner Diagnosing Phys: Fransico Him MD IMPRESSIONS  1. Left ventricular ejection fraction, by estimation, is 60 to 65%. The left ventricle has normal function. Left ventricular endocardial border not optimally defined to evaluate regional wall motion. Left ventricular diastolic function could not be evaluated.  2. Right ventricular systolic function is normal. The right ventricular size is normal. Tricuspid regurgitation signal is inadequate for assessing PA pressure.  3. The mitral valve is normal in structure. Trivial mitral valve regurgitation. No evidence of mitral stenosis.  4. The aortic valve is normal in structure. No evidence of Aortic regurgitation.  5. The inferior vena cava is normal in size with greater than 50% respiratory variability, suggesting  right atrial pressure of 3 mmHg. Conclusion(s)/Recommendation(s): No evidence of valvular vegetations on this transthoracic echocardiogram. Would recommend a transesophageal echocardiogram to exclude infective endocarditis if clinically indicated. FINDINGS  Left Ventricle: Left ventricular ejection fraction, by estimation, is 60 to 65%. The left ventricle has normal function. Left ventricular endocardial border not optimally defined to evaluate regional wall motion. The left ventricular internal cavity size was normal in size. There is no left ventricular hypertrophy. Right Ventricle: The right ventricular size is normal. No increase in right ventricular wall thickness. Right ventricular systolic function is normal. Tricuspid regurgitation signal is inadequate for assessing PA pressure. Left Atrium: Left atrial size was normal in size. Right Atrium: Right atrial size was normal in size. Pericardium: There is no evidence of pericardial effusion. Mitral Valve: The mitral valve is normal in structure. Normal mobility of the mitral valve leaflets. Trivial mitral valve regurgitation. No evidence of mitral valve stenosis. Tricuspid Valve: The tricuspid valve is normal in structure. Tricuspid valve regurgitation is not demonstrated. No evidence of tricuspid stenosis. Aortic Valve: The aortic valve is normal in structure. Aortic valve regurgitation is not visualized. No aortic stenosis is present. Echo findings are consistent with normal structure and function of the aortic valve prosthesis. Pulmonic Valve: The pulmonic valve was normal in structure. Pulmonic valve regurgitation is not visualized. No evidence of pulmonic stenosis. Aorta: The aortic root is normal in size and structure. Venous: The inferior vena cava is normal in size with greater than 50% respiratory variability, suggesting right atrial pressure of 3 mmHg. IAS/Shunts: No atrial level shunt detected by color flow Doppler.  IVC IVC diam: 2.00 cm Fransico Him  MD Electronically signed by Fransico Him MD Signature Date/Time: 07/15/2019/4:43:23 PM    Final (Updated)     Microbiology: Recent Results (from the past 240 hour(s))  Surgical pcr screen     Status: Abnormal   Collection Time: 07/17/19  7:49 AM   Specimen: Nasal Mucosa; Nasal Swab  Result Value Ref Range Status   MRSA, PCR NEGATIVE NEGATIVE Final   Staphylococcus aureus POSITIVE (  A) NEGATIVE Final    Comment: (NOTE) The Xpert SA Assay (FDA approved for NASAL specimens in patients 71 years of age and older), is one component of a comprehensive surveillance program. It is not intended to diagnose infection nor to guide or monitor treatment. Performed at [facility], [place] [address] [place], [place] [address]   Culture, Urine     Status: None   Collection Time: 07/22/19  3:54 PM   Specimen: Urine, Random  Result Value Ref Range Status   Specimen Description URINE, RANDOM  Final   Special Requests NONE  Final   Culture   Final    NO GROWTH Performed at [facility], [place] [address] [place], [place] [address]    Report Status 07/23/2019 FINAL  Final     Labs: Basic Metabolic Panel: Recent Labs  Lab 07/21/19 0419 07/22/19 0456 07/23/19 0430 07/24/19 0500 07/25/19 0454  NA 135 138 137 136 138  K 3.6 3.7 3.9 3.4* 3.2*  CL 99 101 100 99 100  CO2 24 23 26 25 26   GLUCOSE 268* 157* 289* 236* 203*  BUN 40* 46* 42* 44* 44*  CREATININE 8.42* 9.32* 8.79* 8.74* 8.79*  CALCIUM 8.8* 9.2 9.0 9.0 9.0  MG 1.6* 2.1 2.0 2.0 1.9  PHOS 2.8 4.3 3.6 4.1 3.9   Liver Function Tests: Recent Labs  Lab 07/21/19 0419 07/22/19 0456 07/23/19 0430 07/24/19 0500 07/25/19 0454  AST 10* 14* 10* 7* 11*  ALT <5 <5 <5 <5 <5  ALKPHOS 71 71 69 65 63  BILITOT 0.4 0.5 0.5 0.6 0.3  PROT 6.5 6.2* 6.2* 6.4* 6.0*  ALBUMIN 1.6* 1.7* 1.7* 1.8* 1.8*   No results for input(s): LIPASE, AMYLASE in the last 168 hours. No results for input(s): AMMONIA in the last 168 hours. CBC: Recent Labs   Lab 07/21/19 0419 07/22/19 0456 07/23/19 0430 07/24/19 0500 07/25/19 0454  WBC 21.7* 25.5* 21.4* 22.5* 19.9*  NEUTROABS 19.3* 18.0* 16.7* 20.3* 14.6*  HGB 8.1* 7.8* 7.9* 8.3* 8.5*  HCT 26.8* 26.3* 27.2* 27.8* 28.3*  MCV 92.7 92.9 94.8 94.9 94.3  PLT 228 230 209 204 180   Cardiac Enzymes: No results for input(s): CKTOTAL, CKMB, CKMBINDEX, TROPONINI in the last 168 hours. BNP: BNP (last 3 results) Recent Labs    06/30/19 2240  BNP 866.0*    ProBNP (last 3 results) No results for input(s): PROBNP in the last 8760 hours.  CBG: Recent Labs  Lab 07/24/19 1117 07/25/19 0043 07/25/19 0603 07/25/19 1124 07/25/19 1736  GLUCAP 180* 235* 177* 123* 145*       Signed:  Dia Crawford, MD [facility] [phone] pager

## 2019-07-25 NOTE — Plan of Care (Signed)
  Problem: Clinical Measurements: Goal: Will remain free from infection Outcome: Progressing   Problem: Activity: Goal: Risk for activity intolerance will decrease Outcome: Progressing   Problem: Pain Managment: Goal: General experience of comfort will improve Outcome: Progressing   

## 2019-07-26 ENCOUNTER — Encounter (HOSPITAL_COMMUNITY): Payer: Self-pay | Admitting: Internal Medicine

## 2019-07-26 LAB — CBC WITH DIFFERENTIAL/PLATELET
Abs Immature Granulocytes: 0.94 10*3/uL — ABNORMAL HIGH (ref 0.00–0.07)
Basophils Absolute: 0.1 10*3/uL (ref 0.0–0.1)
Basophils Relative: 0 %
Eosinophils Absolute: 0 10*3/uL (ref 0.0–0.5)
Eosinophils Relative: 0 %
HCT: 28.9 % — ABNORMAL LOW (ref 39.0–52.0)
Hemoglobin: 8.5 g/dL — ABNORMAL LOW (ref 13.0–17.0)
Immature Granulocytes: 5 %
Lymphocytes Relative: 13 %
Lymphs Abs: 2.2 10*3/uL (ref 0.7–4.0)
MCH: 27.9 pg (ref 26.0–34.0)
MCHC: 29.4 g/dL — ABNORMAL LOW (ref 30.0–36.0)
MCV: 94.8 fL (ref 80.0–100.0)
Monocytes Absolute: 1.1 10*3/uL — ABNORMAL HIGH (ref 0.1–1.0)
Monocytes Relative: 6 %
Neutro Abs: 13.3 10*3/uL — ABNORMAL HIGH (ref 1.7–7.7)
Neutrophils Relative %: 76 %
Platelets: 170 10*3/uL (ref 150–400)
RBC: 3.05 MIL/uL — ABNORMAL LOW (ref 4.22–5.81)
RDW: 18.9 % — ABNORMAL HIGH (ref 11.5–15.5)
WBC: 17.6 10*3/uL — ABNORMAL HIGH (ref 4.0–10.5)
nRBC: 0.3 % — ABNORMAL HIGH (ref 0.0–0.2)

## 2019-07-26 LAB — RENAL FUNCTION PANEL
Albumin: 1.9 g/dL — ABNORMAL LOW (ref 3.5–5.0)
Anion gap: 10 (ref 5–15)
BUN: 43 mg/dL — ABNORMAL HIGH (ref 6–20)
CO2: 25 mmol/L (ref 22–32)
Calcium: 9 mg/dL (ref 8.9–10.3)
Chloride: 101 mmol/L (ref 98–111)
Creatinine, Ser: 9.1 mg/dL — ABNORMAL HIGH (ref 0.61–1.24)
GFR calc Af Amer: 7 mL/min — ABNORMAL LOW (ref >60)
GFR calc non Af Amer: 6 mL/min — ABNORMAL LOW (ref >60)
Glucose, Bld: 227 mg/dL — ABNORMAL HIGH (ref 70–99)
Phosphorus: 3.6 mg/dL (ref 2.5–4.6)
Potassium: 3.5 mmol/L (ref 3.5–5.1)
Sodium: 136 mmol/L (ref 135–145)

## 2019-07-26 LAB — GLUCOSE, CAPILLARY
Glucose-Capillary: 135 mg/dL — ABNORMAL HIGH (ref 70–99)
Glucose-Capillary: 191 mg/dL — ABNORMAL HIGH (ref 70–99)
Glucose-Capillary: 199 mg/dL — ABNORMAL HIGH (ref 70–99)
Glucose-Capillary: 208 mg/dL — ABNORMAL HIGH (ref 70–99)

## 2019-07-26 LAB — PROTIME-INR
INR: 1.2 (ref 0.8–1.2)
Prothrombin Time: 14.9 seconds (ref 11.4–15.2)

## 2019-07-26 LAB — HEPARIN LEVEL (UNFRACTIONATED): Heparin Unfractionated: 0.55 IU/mL (ref 0.30–0.70)

## 2019-07-26 LAB — MAGNESIUM: Magnesium: 1.8 mg/dL (ref 1.7–2.4)

## 2019-07-26 MED ORDER — WARFARIN SODIUM 4 MG PO TABS
4.0000 mg | ORAL_TABLET | Freq: Once | ORAL | Status: AC
  Administered 2019-07-26: 4 mg via ORAL
  Filled 2019-07-26: qty 1

## 2019-07-26 MED ORDER — PANTOPRAZOLE SODIUM 40 MG PO TBEC
40.0000 mg | DELAYED_RELEASE_TABLET | Freq: Every day | ORAL | Status: DC
  Administered 2019-07-26 – 2019-07-28 (×3): 40 mg via ORAL
  Filled 2019-07-26 (×3): qty 1

## 2019-07-26 NOTE — Progress Notes (Signed)
Progress Note    Max Pittman.  PNT:[id number] DOB: [birth year]  DOA: 07/13/2019 PCP: Chesley Noon, MD    Brief Narrative:     Medical records reviewed and are as summarized below:  44 y/o male with complex medical history including ESRD on peritoneal dialysis 7 weeks post op ORIF complex L acetabulum fracture dislocation with L thigh abscessand osteomyelitis of the right index finger.  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  For 6 weeks per Dr. Marcelino Scot -Transthoracic echo showed no abnormalities  -TEE; WNL  Dialysis catheter discontinued on 07/17/2019, along with wound VAC -Follow-up with Dr. Marcelino Scot orthopedic surgery 30 June@1400   Osteomyelitis of  right index finger: MRI showed osteo myelitis and hand surgery has been consulted for possible surgical intervention. -Dr. Roseanne Kaufman: s/p surgeryFriday 6/25 -6/25RIGHT index finger amputation -Follow-up withDr. Chanda Busing orthopedic surgeon on 12 July@1315   End-stage renal disease on peritoneal dialysis. -PD per nephrology   DM type II controlled with  complication -0/[id number] A1c = 6.2. -Titrate insulin  Essential hypertension: -Titrate blood pressure medication for better control  History of CVA: -Treated by Dr. Willaim Rayas  History of DVT 5/9 at [facility]: -6/29 orthopedic surgery states can restart Coumadin.. - IV heparin bridge to INR of >2 -I did an in-depth chart review that shows initially patient was started on Coumadin on 4/27 for DVT prophylaxis for 8 weeks by orthopedics.  Patient went to [facility] and was diagnosed with DVT on 5/9  obesity Body mass index is 40.03 kg/m.   Family Communication/Anticipated D/C date and plan/Code Status   DVT prophylaxis: Heparin bridge to Coumadin therapeutic Code Status: Full Code.   Disposition Plan: Status is: Inpatient  Remains inpatient appropriate because:IV treatments appropriate due to intensity of illness or inability to take PO   Dispo: The patient is from: Home              Anticipated d/c is to: Home              Anticipated d/c date is: 2 days              Patient currently is not medically stable to d/c.  Needs therapeutic INR for DVT prior to discharge        Medical Consultants:   Renal Orthopedics  Subjective:   No current complaints  Objective:    Vitals:   07/25/19 1805 07/25/19 2059 07/26/19 0459 07/26/19 0936  BP: 140/87 (!) 146/92 136/81 130/82  Pulse: 80 81 78 85  Resp: 17 18  18 18  Temp: 98 F (36.7 C) 97.7 F (36.5 C) 98 F (36.7 C) 97.8 F (36.6 C)  TempSrc: Oral   Oral  SpO2: 98% 100% 98% 97%  Weight: 118.4 kg  123 kg   Height:        Intake/Output Summary (Last 24 hours) at 07/26/2019 1333 Last data filed at 07/26/2019 0600 Gross per 24 hour  Intake 732.79 ml  Output 225 ml  Net 507.79 ml   Filed Weights   07/25/19 0432 07/25/19 1805 07/26/19 0459  Weight: 120.5 kg 118.4 kg 123 kg    Exam:  General: Appearance:    Severely obese male in no acute distress     Lungs:     Clear to auscultation bilaterally, respirations  unlabored  Heart:    Normal heart rate. Normal rhythm. No murmurs, rubs, or gallops.       Neurologic:   Awake, alert, oriented x 3. No apparent focal neurological           defect.     Data Reviewed:   I have personally reviewed following labs and imaging studies:  Labs: Labs show the following:   Basic Metabolic Panel: Recent Labs  Lab 07/22/19 0456 07/22/19 0456 07/23/19 0430 07/23/19 0430 07/24/19 0500 07/24/19 0500 07/25/19 0454 07/26/19 0441  NA 138  --  137  --  136  --  138 136  K 3.7   < > 3.9   < > 3.4*   < > 3.2* 3.5  CL 101  --  100  --  99  --  100 101  CO2 23  --  26  --  25  --  26 25  GLUCOSE 157*  --  289*  --  236*  --  203* 227*  BUN 46*  --  42*  --  44*  --  44* 43*  CREATININE 9.32*  --  8.79*  --  8.74*  --  8.79* 9.10*  CALCIUM 9.2  --  9.0  --  9.0  --  9.0 9.0  MG 2.1  --  2.0  --  2.0  --  1.9 1.8  PHOS 4.3  --  3.6  --  4.1  --  3.9 3.6   < > = values in this interval not displayed.   GFR Estimated Creatinine Clearance: 13.6 mL/min (A) (by C-G formula based on SCr of 9.1 mg/dL (H)). Liver Function Tests: Recent Labs  Lab 07/21/19 0419 07/21/19 0419 07/22/19 0456 07/23/19 0430 07/24/19 0500 07/25/19 0454 07/26/19 0441  AST 10*  --  14* 10* 7* 11*  --   ALT <5  --  <5 <5 <5 <5  --   ALKPHOS 71  --  71 69 65 63  --   BILITOT 0.4  --  0.5 0.5 0.6 0.3  --   PROT 6.5  --  6.2* 6.2* 6.4* 6.0*  --   ALBUMIN 1.6*   < > 1.7* 1.7* 1.8* 1.8* 1.9*   < > = values in this interval not displayed.   No results for input(s): LIPASE, AMYLASE in the last 168 hours. No results for input(s): AMMONIA in the last 168 hours. Coagulation profile Recent Labs  Lab 07/20/19 0407 07/25/19 1159 07/26/19 0441  INR 1.4* 1.1 1.2    CBC: Recent Labs  Lab 07/22/19 0456 07/23/19 0430 07/24/19 0500 07/25/19 0454 07/26/19 0441  WBC 25.5* 21.4* 22.5* 19.9* 17.6*  NEUTROABS 18.0* 16.7* 20.3* 14.6* 13.3*  HGB  7.8* 7.9* 8.3* 8.5* 8.5*  HCT 26.3* 27.2*  27.8* 28.3* 28.9*  MCV 92.9 94.8 94.9 94.3 94.8  PLT 230 209 204 180 170   Cardiac Enzymes: No results for input(s): CKTOTAL, CKMB, CKMBINDEX, TROPONINI in the last 168 hours. BNP (last 3 results) No results for input(s): PROBNP in the last 8760 hours. CBG: Recent Labs  Lab 07/25/19 1124 07/25/19 1736 07/26/19 0051 07/26/19 0542 07/26/19 1139  GLUCAP 123* 145* 208* 199* 135*   D-Dimer: No results for input(s): DDIMER in the last 72 hours. Hgb A1c: No results for input(s): HGBA1C in the last 72 hours. Lipid Profile: No results for input(s): CHOL, HDL, LDLCALC, TRIG, CHOLHDL, LDLDIRECT in the last 72 hours. Thyroid function studies: No results for input(s): TSH, T4TOTAL, T3FREE, THYROIDAB in the last 72 hours.  Invalid input(s): FREET3 Anemia work up: No results for input(s): VITAMINB12, FOLATE, FERRITIN, TIBC, IRON, RETICCTPCT in the last 72 hours. Sepsis Labs: Recent Labs  Lab 07/23/19 0430 07/24/19 0500 07/25/19 0454 07/26/19 0441  WBC 21.4* 22.5* 19.9* 17.6*    Microbiology Recent Results (from the past 240 hour(s))  Surgical pcr screen     Status: Abnormal   Collection Time: 07/17/19  7:49 AM   Specimen: Nasal Mucosa; Nasal Swab  Result Value Ref Range Status   MRSA, PCR NEGATIVE NEGATIVE Final   Staphylococcus aureus POSITIVE (A) NEGATIVE Final    Comment: (NOTE) The Xpert SA Assay (FDA approved for NASAL specimens in patients 71 years of age and older), is one component of a comprehensive surveillance program. It is not intended to diagnose infection nor to guide or monitor treatment. Performed at [facility], [place] [address], [place], [place] [address]   Culture, Urine     Status: None   Collection Time: 07/22/19  3:54 PM   Specimen: Urine, Random  Result Value Ref Range Status   Specimen Description URINE, RANDOM  Final   Special Requests NONE  Final   Culture   Final    NO GROWTH Performed at [facility], [place] [address],  [place], [place] [address]    Report Status 07/23/2019 FINAL  Final    Procedures and diagnostic studies:  No results found.  Medications:   . amLODipine  5 mg Oral Daily  . atorvastatin  40 mg Oral q1800  . buPROPion  150 mg Oral BID WC  . Chlorhexidine Gluconate Cloth  6 each Topical Daily  . darbepoetin (ARANESP) injection - NON-DIALYSIS  200 mcg Subcutaneous Q Sun-1800  . feeding supplement (PRO-STAT SUGAR FREE 64)  30 mL Oral BID  . gentamicin cream  1 application Topical Daily  . insulin aspart  0-9 Units Subcutaneous Q6H  . insulin aspart  6 Units Subcutaneous TID WC  . insulin detemir  10 Units Subcutaneous Daily  . metoprolol tartrate  12.5 mg Oral BID  . [START ON 07/27/2019] predniSONE  10 mg Oral Q breakfast  . pregabalin  50 mg Oral QHS  . sevelamer carbonate  2,400 mg Oral TID WC  . sodium chloride flush  10-40 mL Intracatheter Q12H  . warfarin  4 mg Oral ONCE-1600  . Warfarin - Pharmacist Dosing Inpatient   Does not apply q1600   Continuous Infusions: . sodium chloride 10 mL/hr at 07/21/19 1844  . sodium chloride    .  ceFAZolin (ANCEF) IV 1 g (07/26/19 1226)  . dialysis solution 2.5% low-MG/low-CA    . heparin 1,700 Units/hr (07/26/19 0539)  LOS: 13 days   Geradine Girt  [facility]   How to contact the [facility] Attending or Consulting provider [place] or covering provider during after hours [place], for this patient?  1. Check the care team in [facility] and look for a) attending/consulting TR[facility] provider listed and b) the [facility] team listed 2. Log into [email or web address] and use [place]'s universal password to access. If you do not have the password, please contact the hospital operator. 3. Locate the [facility] provider you are looking for under [facility] and page to a number that you can be directly reached. 4. If you still have difficulty reaching the provider, please page the [facility] (Director on Call) for the Hospitalists listed on amion for  assistance.  07/26/2019, 1:33 PM

## 2019-07-26 NOTE — Progress Notes (Signed)
[facility] Progress Note   Assessment/ Plan:   1. MSSA bacteremia: in setting of L hip hardware infection and R index finger osteo.  On Cefazolin.  TEE 6/23 no vegetation.  Repeat I and D 6/22  ID on board  2.ESRD continue PD--> Continue all 2.5% tonight, this seems to be working well.     3. Anemia of CKD : continue ESA, increased Aranesp 200 mcg q Sunday  4. CKD-MBD: binders - phos acceptable   5. Hypertension: acceptable control    6.  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  H/o DVT- on hep gtt--> anticoagulation per primary team   8. Nutrition: needs more protein--> on prostat  9.  Suprapubic pain-  UA doesn't look grossly infected, culture with no growth.  Leukocytosis up to 25 s/p surgery and with pred taper.  PD fluid is all clear without fibrin, don't suspect peritonitis   10.  Dispo: per primary team   Subjective:    UF for 6/28 AM was 1446mL per charge HD report.  UF of 1673 mL for 6/29 AM.  Still on PD now.  Got lasix IV x 1 on 6/29.  He had 0.5 L UOP over 6/29 as well as an unmeasured void.  States 2.5% going well.  Review of systems:   Feels well Denies shortness of breath or chest pain  Denies n/v   Objective:   BP 136/81 (BP Location: Right Arm)   Pulse 78   Temp 98 F (36.7 C)   Resp 18   Ht 5' 9.02" (1.753 m)   Wt 123 kg   SpO2 98%   BMI 40.03 kg/m   Physical Exam:  Gen: adult male in bed in NAD CVS: S1S2 no rub Resp: clear and unlabored on room air Abd soft obese habitus nontender Ext: trace to 1+ LE edema ACCESS: PD catheter in place - bandage c/d/i  Neuro - alert and oriented x 3 follows commands and provides hx Psych - normal mood and affect  Labs: BMET Recent Labs  Lab 07/20/19 0407 07/20/19 0407 07/20/19 1660 07/21/19 0419 07/22/19 0456 07/23/19 0430 07/24/19 0500 07/25/19 0454 07/26/19 0441  NA 135  --   --  135 138 137 136 138 136  K 2.9*  --   --  3.6 3.7 3.9 3.4* 3.2* 3.5  CL 99   --   --  99 101 100 99 100 101  CO2 22  --   --  24 23 26 25 26 25   GLUCOSE 95  --   --  268* 157* 289* 236* 203* 227*  BUN 49*  --   --  40* 46* 42* 44* 44* 43*  CREATININE 8.88*  --   --  8.42* 9.32* 8.79* 8.74* 8.79* 9.10*  CALCIUM 8.7*  --   --  8.8* 9.2 9.0 9.0 9.0 9.0  PHOS 4.3   < > 3.9 2.8 4.3 3.6 4.1 3.9 3.6   < > = values in this interval not displayed.   CBC Recent Labs  Lab 07/23/19 0430 07/24/19 0500 07/25/19 0454 07/26/19 0441  WBC 21.4* 22.5* 19.9* 17.6*  NEUTROABS 16.7* 20.3* 14.6* 13.3*  HGB 7.9* 8.3* 8.5* 8.5*  HCT 27.2* 27.8* 28.3* 28.9*  MCV 94.8 94.9 94.3 94.8  PLT 209 204 180 170      Medications:    . amLODipine  5 mg Oral Daily  . atorvastatin  40 mg Oral q1800  . buPROPion  150 mg Oral BID WC  . Chlorhexidine Gluconate Cloth  6 each Topical Daily  . darbepoetin (ARANESP) injection - NON-DIALYSIS  200 mcg Subcutaneous Q Sun-1800  . feeding supplement (PRO-STAT SUGAR FREE 64)  30 mL Oral BID  . gentamicin cream  1 application Topical Daily  . insulin aspart  0-9 Units Subcutaneous Q6H  . insulin aspart  6 Units Subcutaneous TID WC  . insulin detemir  10 Units Subcutaneous Daily  . metoprolol tartrate  12.5 mg Oral BID  . predniSONE  10 mg Oral BID WC  . [START ON 07/27/2019] predniSONE  10 mg Oral Q breakfast  . pregabalin  50 mg Oral QHS  . sevelamer carbonate  2,400 mg Oral TID WC  . sodium chloride flush  10-40 mL Intracatheter Q12H  . Warfarin - Pharmacist Dosing Inpatient   Does not apply q1600    [name] MD 07/26/2019, 6:56 AM

## 2019-07-26 NOTE — Plan of Care (Signed)
  Problem: Pain Managment: Goal: General experience of comfort will improve Outcome: Progressing   

## 2019-07-26 NOTE — Plan of Care (Signed)
  Problem: Activity: Goal: Risk for activity intolerance will decrease Outcome: Progressing   

## 2019-07-26 NOTE — Progress Notes (Signed)
Physical Therapy Treatment Patient Details Name: Max Pittman. MRN: [id number] DOB: 1975/06/06 Today's Date: 07/26/2019    History of Present Illness 44 y.o. male with medical history significant for ESRD on peritoneal dialysis, diabetes mellitus, hypertension, DVT. Pt with L hip surgery in April, having persistent pain in this hip since, unable to walk recently due to pain. 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 Pt also hospitalized 6/4-10 for respiratory failure. Pt with L hip fluid collection, undergoing I&D L hip on 6/18. 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, Now status post DIP fusion for an osteomyelitic focus about the right index finger     PT Comments    Continuing work on functional mobility and activity tolerance;  Session focused on progressive amb, with the specific goal of managing household distances safely; Overall good walking this session, especailly initial walking -- showed signs of fatigue after about 40 ft; lowered RW one notch to see if that helps with energy conservation  Follow Up Recommendations  Home health PT     Equipment Recommendations  None recommended by PT    Recommendations for Other Services       Precautions / Restrictions Precautions Precautions: Fall;Posterior Hip Restrictions Weight Bearing Restrictions: Yes RLE Weight Bearing: Weight bearing as tolerated LLE Weight Bearing: Partial weight bearing LLE Partial Weight Bearing Percentage or Pounds: 50    Mobility  Bed Mobility Overal bed mobility: Needs Assistance Bed Mobility: Supine to Sit     Supine to sit: Supervision     General bed mobility comments: Use fo rail, no physical assist needed  Transfers Overall transfer level: Needs assistance Equipment used: Rolling walker (2 wheeled) Transfers: Sit to/from Stand Sit to Stand: Min guard (without physical contact)          General transfer comment: Good rise and hand placement on RW; Cues for positioning LLE with respect to posterior hip precautions  Ambulation/Gait Ambulation/Gait assistance: Min guard Gait Distance (Feet): 80 Feet Assistive device: Rolling walker (2 wheeled) Gait Pattern/deviations: Step-to pattern     General Gait Details: Heavy dependence on RW, but overall good steps; trunk slightly flexed; Noteworthy that steps were more rushed heading back to room and pt showed signs of fatigue   Stairs             Wheelchair Mobility    Modified Rankin (Stroke Patients Only)       Balance                                            Cognition Arousal/Alertness: Awake/alert Behavior During Therapy: WFL for tasks assessed/performed;Anxious Overall Cognitive Status: Within Functional Limits for tasks assessed                                        Exercises      General Comments        Pertinent Vitals/Pain Pain Assessment: Faces Faces Pain Scale: Hurts a little bit Pain Location: L hip Pain Descriptors / Indicators: Aching;Grimacing;Sore Pain Intervention(s): Repositioned;Monitored during session    Home Living                      Prior Function  PT Goals (current goals can now be found in the care plan section) Acute Rehab PT Goals Patient Stated Goal: do whatever it takes to get better and go home, pain control PT Goal Formulation: With patient Time For Goal Achievement: 07/30/19 Potential to Achieve Goals: Good Progress towards PT goals: Progressing toward goals    Frequency    Min 3X/week      PT Plan Current plan remains appropriate    Co-evaluation              AM-PAC PT "6 Clicks" Mobility   Outcome Measure  Help needed turning from your back to your side while in a flat bed without using bedrails?: A Little Help needed moving from lying on your back to sitting on the side of a flat  bed without using bedrails?: A Little Help needed moving to and from a bed to a chair (including a wheelchair)?: A Little Help needed standing up from a chair using your arms (e.g., wheelchair or bedside chair)?: A Little Help needed to walk in hospital room?: A Little Help needed climbing 3-5 steps with a railing? : A Lot 6 Click Score: 17    End of Session Equipment Utilized During Treatment: Gait belt Activity Tolerance: Patient tolerated treatment well Patient left: with call bell/phone within reach (sitting on [facility]) Nurse Communication: Mobility status PT Visit Diagnosis: Pain;Other (comment);Muscle weakness (generalized) (M62.81);Difficulty in walking, not elsewhere classified (R26.2) Pain - Right/Left: Left Pain - part of body: Leg     Time: 1227-1253 PT Time Calculation (min) (ACUTE ONLY): 26 min  Charges:  $Gait Training: 8-22 mins $Therapeutic Activity: 8-22 mins                     Roney Marion, PT  Acute Rehabilitation Services Pager [phone] Office [place] 07/26/2019, 2:28 PM

## 2019-07-26 NOTE — Progress Notes (Signed)
Occupational Therapy Treatment Patient Details Name: Max Pittman. MRN: [id number] DOB: September 03, 1975 Today's Date: 07/26/2019    History of present illness 44 y.o. male with medical history significant for ESRD on peritoneal dialysis, diabetes mellitus, hypertension, DVT. Pt with L hip surgery in April, having persistent pain in this hip since, unable to walk recently due to pain. 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 Pt also hospitalized 6/4-10 for respiratory failure. Pt with L hip fluid collection, undergoing I&D L hip on 6/18. 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  Able to complete UB bath seated with set up and LB with min guard (min assist for lower legs as did not have AE).  Patient verbalized all hip precautions independently and maintained precautions throughout session during ADLs without cueing.  Stood and completed mobility in room with min guard and RW.  Patient very receptive to therapy and happy to participate.  Will continue to follow with OT acutely to address the deficits listed below.    Follow Up Recommendations  Home health OT;Supervision - Intermittent    Equipment Recommendations  None recommended by OT    Recommendations for Other Services      Precautions / Restrictions Precautions Precautions: Fall;Posterior Hip Restrictions Weight Bearing Restrictions: Yes RLE Weight Bearing: Weight bearing as tolerated LLE Weight Bearing: Partial weight bearing LLE Partial Weight Bearing Percentage or Pounds: 50       Mobility Bed Mobility Overal bed mobility: Needs Assistance Bed Mobility: Supine to Sit     Supine to sit: Supervision     General bed mobility comments: In chair on  arrival  Transfers Overall transfer level: Needs assistance Equipment used: Rolling walker (2 wheeled) Transfers: Sit to/from Stand Sit to Stand: Min guard         General transfer comment: Good rise and hand placement on RW; Cues for positioning LLE with respect to posterior hip precautions    Balance Overall balance assessment: Needs assistance Sitting-balance support: No upper extremity supported;Feet supported Sitting balance-Leahy Scale: Good     Standing balance support: Bilateral upper extremity supported Standing balance-Leahy Scale: Poor                             ADL either performed or assessed with clinical judgement   ADL Overall ADL's : Needs assistance/impaired         Upper Body Bathing: Set up;Sitting   Lower Body Bathing: Minimal assistance;Sit to/from stand Lower Body Bathing Details (indicate cue type and reason): Min A to assist with lower leg/feet as no AE                     Functional mobility during ADLs: Min guard;Rolling walker       Vision       Perception     Praxis      Cognition Arousal/Alertness: Awake/alert Behavior During Therapy: WFL for tasks assessed/performed Overall Cognitive Status: Within Functional Limits for tasks assessed                                 General Comments:  Able to recall and keep hip precautions during session        Exercises     Shoulder Instructions       General Comments      Pertinent Vitals/ Pain       Pain Assessment: Faces Faces Pain Scale: Hurts a little bit Pain Location: L hip Pain Descriptors / Indicators: Aching;Grimacing;Sore Pain Intervention(s): Limited activity within patient's tolerance;Monitored during session;Repositioned  Home Living                                          Prior Functioning/Environment              Frequency  Min 2X/week        Progress Toward Goals  OT Goals(current goals can  now be found in the care plan section)  Progress towards OT goals: Progressing toward goals  Acute Rehab OT Goals Patient Stated Goal: do whatever it takes to get better and go home, pain control OT Goal Formulation: With patient Time For Goal Achievement: 08/02/19 Potential to Achieve Goals: Good  Plan Discharge plan remains appropriate    Co-evaluation                 AM-PAC OT "6 Clicks" Daily Activity     Outcome Measure   Help from another person eating meals?: None Help from another person taking care of personal grooming?: A Little Help from another person toileting, which includes using toliet, bedpan, or urinal?: A Little Help from another person bathing (including washing, rinsing, drying)?: A Little Help from another person to put on and taking off regular upper body clothing?: A Little Help from another person to put on and taking off regular lower body clothing?: A Little 6 Click Score: 19    End of Session Equipment Utilized During Treatment: Rolling walker  OT Visit Diagnosis: Unsteadiness on feet (R26.81);Muscle weakness (generalized) (M62.81);History of falling (Z91.81);Pain Pain - Right/Left: Left Pain - part of body: Hip   Activity Tolerance Patient tolerated treatment well   Patient Left in chair;with call bell/phone within reach   Nurse Communication Mobility status        Time: 1749-4496 OT Time Calculation (min): 14 min  Charges: OT General Charges $OT Visit: 1 Visit OT Treatments $Self Care/Home Management : 8-22 mins  August Luz, OTR/L    Phylliss Bob 07/26/2019, 3:20 PM

## 2019-07-26 NOTE — Progress Notes (Signed)
ANTICOAGULATION CONSULT NOTE - Follow Up Consult  Pharmacy Consult for Heparin Indication: h/o DVT, recent CVA   Anticoag: PTA warfarin for hx DVT. 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 Orthopedic surgery to dictate when to start warfarin--s/p finger amputation 6/25. Hep resumed post-op.  Heparin level this am 0.55 units/ml on heparin drip rate 1700 units/hr, level remains therapeutic.  Warfarin resumed on 07/25/19.  Patient with recent clot (per patient) in last 6 months. Pharmacist discussed anticoagulation with Dr. Sherral Hammers on 6/29, noting plan to keep patient inpatient and bridge with heparin to therapeutic INR. PTA Warfarin dose was 2 mg daily.  Today INR is 1.2, subtherapeutic.  Heparin level remains therapeutic on IV heparin bridge.   Hgb low stable and pltc wnl.  No bleeding reported.   Goal of Therapy:  INR 2-3 Heparin level 0.3-0.7 units/ml Monitor platelets by anticoagulation protocol: Yes   Plan:  Continue IV heparin infusion at 1700 units/hr Warfarin 4 mg po x1 today Daily HL, CBC, and Protime/INR  Thank you for allowing pharmacy to be part of this patients care team.  Nicole Cella, [place] Please utilize Amion for appropriate phone number to reach the unit pharmacist ([place]) 07/26/2019 10:28 AM

## 2019-07-27 LAB — CBC WITH DIFFERENTIAL/PLATELET
Abs Immature Granulocytes: 0.75 10*3/uL — ABNORMAL HIGH (ref 0.00–0.07)
Basophils Absolute: 0 10*3/uL (ref 0.0–0.1)
Basophils Relative: 0 %
Eosinophils Absolute: 0 10*3/uL (ref 0.0–0.5)
Eosinophils Relative: 0 %
HCT: 28.9 % — ABNORMAL LOW (ref 39.0–52.0)
Hemoglobin: 8.6 g/dL — ABNORMAL LOW (ref 13.0–17.0)
Immature Granulocytes: 5 %
Lymphocytes Relative: 12 %
Lymphs Abs: 1.9 10*3/uL (ref 0.7–4.0)
MCH: 28.8 pg (ref 26.0–34.0)
MCHC: 29.8 g/dL — ABNORMAL LOW (ref 30.0–36.0)
MCV: 96.7 fL (ref 80.0–100.0)
Monocytes Absolute: 1 10*3/uL (ref 0.1–1.0)
Monocytes Relative: 6 %
Neutro Abs: 12.4 10*3/uL — ABNORMAL HIGH (ref 1.7–7.7)
Neutrophils Relative %: 77 %
Platelets: 151 10*3/uL (ref 150–400)
RBC: 2.99 MIL/uL — ABNORMAL LOW (ref 4.22–5.81)
RDW: 18.9 % — ABNORMAL HIGH (ref 11.5–15.5)
WBC: 16.2 10*3/uL — ABNORMAL HIGH (ref 4.0–10.5)
nRBC: 0.4 % — ABNORMAL HIGH (ref 0.0–0.2)

## 2019-07-27 LAB — RENAL FUNCTION PANEL
Albumin: 1.9 g/dL — ABNORMAL LOW (ref 3.5–5.0)
Anion gap: 10 (ref 5–15)
BUN: 47 mg/dL — ABNORMAL HIGH (ref 6–20)
CO2: 26 mmol/L (ref 22–32)
Calcium: 9 mg/dL (ref 8.9–10.3)
Chloride: 101 mmol/L (ref 98–111)
Creatinine, Ser: 9.43 mg/dL — ABNORMAL HIGH (ref 0.61–1.24)
GFR calc Af Amer: 7 mL/min — ABNORMAL LOW (ref >60)
GFR calc non Af Amer: 6 mL/min — ABNORMAL LOW (ref >60)
Glucose, Bld: 180 mg/dL — ABNORMAL HIGH (ref 70–99)
Phosphorus: 3.7 mg/dL (ref 2.5–4.6)
Potassium: 3.3 mmol/L — ABNORMAL LOW (ref 3.5–5.1)
Sodium: 137 mmol/L (ref 135–145)

## 2019-07-27 LAB — GLUCOSE, CAPILLARY
Glucose-Capillary: 109 mg/dL — ABNORMAL HIGH (ref 70–99)
Glucose-Capillary: 132 mg/dL — ABNORMAL HIGH (ref 70–99)
Glucose-Capillary: 147 mg/dL — ABNORMAL HIGH (ref 70–99)
Glucose-Capillary: 167 mg/dL — ABNORMAL HIGH (ref 70–99)
Glucose-Capillary: 173 mg/dL — ABNORMAL HIGH (ref 70–99)
Glucose-Capillary: 190 mg/dL — ABNORMAL HIGH (ref 70–99)

## 2019-07-27 LAB — HEPARIN LEVEL (UNFRACTIONATED): Heparin Unfractionated: 0.73 IU/mL — ABNORMAL HIGH (ref 0.30–0.70)

## 2019-07-27 LAB — PROTIME-INR
INR: 1.8 — ABNORMAL HIGH (ref 0.8–1.2)
Prothrombin Time: 19.9 seconds — ABNORMAL HIGH (ref 11.4–15.2)

## 2019-07-27 LAB — MAGNESIUM: Magnesium: 1.9 mg/dL (ref 1.7–2.4)

## 2019-07-27 MED ORDER — WARFARIN SODIUM 2 MG PO TABS
2.0000 mg | ORAL_TABLET | Freq: Once | ORAL | Status: AC
  Administered 2019-07-27: 2 mg via ORAL
  Filled 2019-07-27: qty 1

## 2019-07-27 MED ORDER — POTASSIUM CHLORIDE CRYS ER 20 MEQ PO TBCR
20.0000 meq | EXTENDED_RELEASE_TABLET | Freq: Once | ORAL | Status: AC
  Administered 2019-07-27: 20 meq via ORAL
  Filled 2019-07-27: qty 1

## 2019-07-27 NOTE — Progress Notes (Signed)
[facility] Progress Note   Assessment/ Plan:   1. MSSA bacteremia: in setting of L hip hardware infection and R index finger osteo.  On Cefazolin.  TEE 6/23 no vegetation.  Repeat I and D 6/22  ID on board.  Has RIJ catheter for abx.  2.ESRD continue PD--> Continue all 2.5% tonight, this seems to be working well.  Will give 20 meq potassium today, 7/1.  Consider 10 meq potassium daily on discharge  3. Anemia of CKD : continue ESA, increased Aranesp 200 mcg q Sunday  4. CKD-MBD: binders - phos acceptable   5. Hypertension: acceptable control    6.  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  H/o DVT- on hep gtt--> anticoagulation per primary team   8. Nutrition: needs more protein--> on prostat  9.  Suprapubic pain-  UA doesn't look grossly infected, culture with no growth.  Leukocytosis up to 25 s/p surgery and with pred taper.  PD fluid is all clear without fibrin, don't suspect peritonitis   10.  Dispo: per primary team.   Subjective:    UF for 6/30 AM was never charted.  He had 0.3 L UOP over 6/30 as well as 3 unmeasured voids.  States 2.5% going well.  States he's been on for several hours longer here (duration of this hospitalization) than at home.  Prior UF trends:  6/28 AM was 1477mL per charge HD report.   UF of 1673 mL for 6/29  Review of systems:   Feels well Denies shortness of breath or chest pain  Denies n/v   Objective:   BP 129/79 (BP Location: Right Arm)   Pulse 73   Temp 97.7 F (36.5 C)   Resp 18   Ht 5' 9.02" (1.753 m)   Wt 122.3 kg   SpO2 98%   BMI 39.80 kg/m   Physical Exam:   Gen: adult male in bed in NAD CVS: S1S2 no rub Resp: clear and unlabored on room air Abd soft obese habitus nontender Ext: 1+ LE edema ACCESS: PD catheter in place - bandage c/d/i; RIJ catheter/CVL  Neuro - alert and oriented x 3 follows commands and provides hx Psych - normal mood and affect  Labs: BMET Recent Labs  Lab  07/21/19 0419 07/22/19 0456 07/23/19 0430 07/24/19 0500 07/25/19 0454 07/26/19 0441 07/27/19 0323  NA 135 138 137 136 138 136 137  K 3.6 3.7 3.9 3.4* 3.2* 3.5 3.3*  CL 99 101 100 99 100 101 101  CO2 24 23 26 25 26 25 26   GLUCOSE 268* 157* 289* 236* 203* 227* 180*  BUN 40* 46* 42* 44* 44* 43* 47*  CREATININE 8.42* 9.32* 8.79* 8.74* 8.79* 9.10* 9.43*  CALCIUM 8.8* 9.2 9.0 9.0 9.0 9.0 9.0  PHOS 2.8 4.3 3.6 4.1 3.9 3.6 3.7   CBC Recent Labs  Lab 07/24/19 0500 07/25/19 0454 07/26/19 0441 07/27/19 0323  WBC 22.5* 19.9* 17.6* 16.2*  NEUTROABS 20.3* 14.6* 13.3* 12.4*  HGB 8.3* 8.5* 8.5* 8.6*  HCT 27.8* 28.3* 28.9* 28.9*  MCV 94.9 94.3 94.8 96.7  PLT 204 180 170 151      Medications:    . amLODipine  5 mg Oral Daily  . atorvastatin  40 mg Oral q1800  . buPROPion  150 mg Oral BID WC  . Chlorhexidine Gluconate Cloth  6 each Topical Daily  . darbepoetin (ARANESP) injection - NON-DIALYSIS  200 mcg Subcutaneous Q Sun-1800  . feeding supplement (PRO-STAT  SUGAR FREE 64)  30 mL Oral BID  . gentamicin cream  1 application Topical Daily  . insulin aspart  0-9 Units Subcutaneous Q6H  . insulin aspart  6 Units Subcutaneous TID WC  . insulin detemir  10 Units Subcutaneous Daily  . metoprolol tartrate  12.5 mg Oral BID  . pantoprazole  40 mg Oral Daily  . predniSONE  10 mg Oral Q breakfast  . pregabalin  50 mg Oral QHS  . sevelamer carbonate  2,400 mg Oral TID WC  . sodium chloride flush  10-40 mL Intracatheter Q12H  . Warfarin - Pharmacist Dosing Inpatient   Does not apply q1600    [name] MD 07/27/2019, 6:52 AM

## 2019-07-27 NOTE — Progress Notes (Signed)
ANTICOAGULATION CONSULT NOTE - Follow Up Consult  Pharmacy Consult for Heparin/warfarin Indication: h/o DVT, recent CVA   Anticoag: PTA warfarin for hx DVT. 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 Orthopedic surgery to dictate when to start warfarin--s/p finger amputation 6/25. Hep resumed post-op.  Heparin level this am 0.73 units/ml on heparin drip rate 1700 units/hr, level slightly supratherapeutic.  Warfarin resumed on 07/25/19.  Patient with recent clot (per patient) in last 6 months. Pharmacist discussed anticoagulation with Dr. Sherral Hammers on 6/29, noting plan to keep patient inpatient and bridge with heparin to therapeutic INR. PTA Warfarin dose was 2 mg daily.  Today INR is 1.8, subtherapeutic.  Heparin level supratherapeutic on IV heparin bridge.   Hgb low stable and pltc wnl.  No bleeding reported.   Goal of Therapy:  INR 2-3 Heparin level 0.3-0.7 units/ml Monitor platelets by anticoagulation protocol: Yes   Plan:  Decrease heparin infusion to 1600 units/hr Warfarin 2 mg po x1 today Daily HL, CBC, and Protime/INR  Jonise Weightman A. Levada Dy, PharmD, BCPS, FNKF Clinical Pharmacist [place] Please utilize Amion for appropriate phone number to reach the unit pharmacist ([place])   07/27/2019 7:28 AM

## 2019-07-27 NOTE — Progress Notes (Signed)
Progress Note    Max Pittman.  UXN:[id number] DOB: May 09, 1975  DOA: 07/13/2019 PCP: Chesley Noon, MD    Brief Narrative:     Medical records reviewed and are as summarized below:  44 y/o male with complex medical history including ESRD on peritoneal dialysis 7 weeks post op ORIF complex L acetabulum fracture dislocation with L thigh abscessand osteomyelitis of the right index finger.  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  For 6 weeks per Dr. Marcelino Scot -Transthoracic echo showed no abnormalities  -TEE; WNL  Dialysis catheter discontinued on 07/17/2019, along with wound VAC -Follow-up with Dr. Marcelino Scot orthopedic surgery 30 June@1400   Osteomyelitis of  right index finger: MRI showed osteo myelitis and hand surgery has been consulted for possible surgical intervention. -Dr. Roseanne Kaufman: s/p surgeryFriday 6/25 -6/25RIGHT index finger amputation -Follow-up withDr. Chanda Busing orthopedic surgeon on 12 July@1315   End-stage renal disease on peritoneal dialysis. -PD per nephrology   DM type II controlled with  complication -2/[id number] A1c = 6.2. -Titrate insulin  Essential hypertension: -Titrate blood pressure medication for better control  History of CVA: -Treated by Dr. Willaim Rayas  History of DVT 5/9 at [facility]: -6/29 orthopedic surgery states can restart Coumadin.. - IV heparin bridge to INR of >2 -I did an in-depth chart review that shows initially patient was started on Coumadin on 4/27 for DVT prophylaxis for 8 weeks by orthopedics.  Patient went to [facility] and was diagnosed with DVT on 5/9  obesity Body mass index is 39.8 kg/m.   Family Communication/Anticipated D/C date and plan/Code Status   DVT prophylaxis: Heparin bridge to Coumadin therapeutic Code Status: Full Code.   Disposition Plan: Status is: Inpatient  Remains inpatient appropriate because:IV treatments appropriate due to intensity of illness or inability to take PO   Dispo: The patient is from: Home              Anticipated d/c is to: Home              Anticipated d/c date is: in AM?              Patient currently is not medically stable to d/c.  Needs therapeutic INR for DVT prior to discharge        Medical Consultants:   [facility]  Subjective:   sleeping  Objective:    Vitals:   07/27/19 0423 07/27/19 0853 07/27/19 1055 07/27/19 1100  BP: 129/79 (!) 151/71 126/82 126/82  Pulse: 73 78 77 77  Resp: 18 18 18  18  Temp: 97.7 F (36.5 C) 97.7 F (36.5 C) (!) 97.5 F (36.4 C) (!) 97.5 F (36.4 C)  TempSrc:  Oral Oral Oral  SpO2: 98% 99% 97% 97%  Weight: 122.3 kg     Height:        Intake/Output Summary (Last 24 hours) at 07/27/2019 1342 Last data filed at 07/27/2019 0556 Gross per 24 hour  Intake 848.34 ml  Output 301 ml  Net 547.34 ml   Filed Weights   07/25/19 1805 07/26/19 0459 07/27/19 0423  Weight: 118.4 kg 123 kg 122.3 kg    Exam: In bed, no NAD                    Data Reviewed:   I have personally reviewed following labs and imaging studies:  Labs: Labs show  the following:   Basic Metabolic Panel: Recent Labs  Lab 07/23/19 0430 07/23/19 0430 07/24/19 0500 07/24/19 0500 07/25/19 0454 07/25/19 0454 07/26/19 0441 07/27/19 0323  NA 137  --  136  --  138  --  136 137  K 3.9   < > 3.4*   < > 3.2*   < > 3.5 3.3*  CL 100  --  99  --  100  --  101 101  CO2 26  --  25  --  26  --  25 26  GLUCOSE 289*  --  236*  --  203*  --  227* 180*  BUN 42*  --  44*  --  44*  --  43* 47*  CREATININE 8.79*  --  8.74*  --  8.79*  --  9.10* 9.43*  CALCIUM 9.0  --  9.0  --  9.0  --  9.0 9.0  MG 2.0  --  2.0  --  1.9  --  1.8 1.9  PHOS 3.6  --  4.1  --  3.9  --  3.6 3.7   < > = values in this interval not displayed.   GFR Estimated Creatinine Clearance: 13 mL/min (A) (by C-G formula based on SCr of 9.43 mg/dL (H)). Liver Function Tests: Recent Labs  Lab 07/21/19 0419 07/21/19 0419 07/22/19 0456 07/22/19 0456 07/23/19 0430 07/24/19 0500 07/25/19 0454 07/26/19 0441 07/27/19 0323  AST 10*  --  14*  --  10* 7* 11*  --   --   ALT <5  --  <5  --  <5 <5 <5  --   --   ALKPHOS 71  --  71  --  69 65 63  --   --   BILITOT 0.4  --  0.5  --  0.5 0.6 0.3  --   --   PROT 6.5  --  6.2*  --  6.2* 6.4* 6.0*  --   --   ALBUMIN 1.6*   < > 1.7*   < > 1.7* 1.8* 1.8* 1.9* 1.9*   < > = values in this interval not displayed.   No results for input(s): LIPASE, AMYLASE in the last 168 hours. No results for input(s): AMMONIA in the last 168 hours. Coagulation profile Recent Labs  Lab 07/25/19 1159 07/26/19 0441 07/27/19 0323  INR 1.1 1.2 1.8*    CBC: Recent Labs  Lab 07/23/19 0430 07/24/19 0500 07/25/19 0454 07/26/19 0441 07/27/19 0323  WBC 21.4* 22.5* 19.9* 17.6* 16.2*  NEUTROABS 16.7* 20.3* 14.6* 13.3* 12.4*  HGB 7.9* 8.3* 8.5* 8.5* 8.6*  HCT 27.2* 27.8* 28.3* 28.9* 28.9*  MCV 94.8 94.9  94.3 94.8 96.7  PLT 209 204 180 170 151   Cardiac Enzymes: No results for input(s): CKTOTAL, CKMB, CKMBINDEX, TROPONINI in the last 168 hours. BNP (last 3  results) No results for input(s): PROBNP in the last 8760 hours. CBG: Recent Labs  Lab 07/26/19 1656 07/27/19 0011 07/27/19 0600 07/27/19 0726 07/27/19 1126  GLUCAP 191* 173* 167* 147* 109*   D-Dimer: No results for input(s): DDIMER in the last 72 hours. Hgb A1c: No results for input(s): HGBA1C in the last 72 hours. Lipid Profile: No results for input(s): CHOL, HDL, LDLCALC, TRIG, CHOLHDL, LDLDIRECT in the last 72 hours. Thyroid function studies: No results for input(s): TSH, T4TOTAL, T3FREE, THYROIDAB in the last 72 hours.  Invalid input(s): FREET3 Anemia work up: No results for input(s): VITAMINB12, FOLATE, FERRITIN, TIBC, IRON, RETICCTPCT in the last 72 hours. Sepsis Labs: Recent Labs  Lab 07/24/19 0500 07/25/19 0454 07/26/19 0441 07/27/19 0323  WBC 22.5* 19.9* 17.6* 16.2*    Microbiology Recent Results (from the past 240 hour(s))  Culture, Urine     Status: None   Collection Time: 07/22/19  3:54 PM   Specimen: Urine, Random  Result Value Ref Range Status   Specimen Description URINE, RANDOM  Final   Special Requests NONE  Final   Culture   Final    NO GROWTH Performed at [facility], [address] [place], [place] [address]    Report Status 07/23/2019 FINAL  Final    Procedures and diagnostic studies:  No results found.  Medications:   . amLODipine  5 mg Oral Daily  . atorvastatin  40 mg Oral q1800  . buPROPion  150 mg Oral BID WC  . Chlorhexidine Gluconate Cloth  6 each Topical Daily  . darbepoetin (ARANESP) injection - NON-DIALYSIS  200 mcg Subcutaneous Q Sun-1800  . feeding supplement (PRO-STAT SUGAR FREE 64)  30 mL Oral BID  . gentamicin cream  1 application Topical Daily  . insulin aspart  0-9 Units Subcutaneous Q6H  . insulin aspart  6 Units Subcutaneous TID WC  . insulin detemir  10 Units Subcutaneous Daily  . metoprolol tartrate  12.5 mg Oral BID  . pantoprazole  40 mg Oral Daily  . predniSONE  10 mg Oral Q breakfast  .  pregabalin  50 mg Oral QHS  . sevelamer carbonate  2,400 mg Oral TID WC  . sodium chloride flush  10-40 mL Intracatheter Q12H  . warfarin  2 mg Oral ONCE-1600  . Warfarin - Pharmacist Dosing Inpatient   Does not apply q1600   Continuous Infusions: . sodium chloride 10 mL/hr at 07/21/19 1844  . sodium chloride 10 mL/hr at 07/26/19 2248  .  ceFAZolin (ANCEF) IV 1 g (07/26/19 1226)  . dialysis solution 2.5% low-MG/low-CA    . heparin 1,600 Units/hr (07/27/19 0804)     LOS: 14 days   Geradine Girt  [facility]   How to contact the [facility] Attending or Consulting provider [place] or covering provider during after hours [place], for this patient?  1. Check the care team in [facility] and look for a) attending/consulting [facility] provider listed and b) the [facility] team listed 2. Log into [email or web address] and use [place]'s universal password to access. If you do not have the password, please contact the hospital operator. 3. Locate the [facility] provider you are looking for under [facility] and page to a number that you can be directly reached. 4. If you still have difficulty  reaching the provider, please page the [facility] (Director on Call) for the Hospitalists listed on amion for assistance.  07/27/2019, 1:42 PM

## 2019-07-28 LAB — PHOSPHORUS: Phosphorus: 4 mg/dL (ref 2.5–4.6)

## 2019-07-28 LAB — RENAL FUNCTION PANEL
Albumin: 2 g/dL — ABNORMAL LOW (ref 3.5–5.0)
Anion gap: 13 (ref 5–15)
BUN: 47 mg/dL — ABNORMAL HIGH (ref 6–20)
CO2: 25 mmol/L (ref 22–32)
Calcium: 9 mg/dL (ref 8.9–10.3)
Chloride: 101 mmol/L (ref 98–111)
Creatinine, Ser: 9.21 mg/dL — ABNORMAL HIGH (ref 0.61–1.24)
GFR calc Af Amer: 7 mL/min — ABNORMAL LOW (ref >60)
GFR calc non Af Amer: 6 mL/min — ABNORMAL LOW (ref >60)
Glucose, Bld: 114 mg/dL — ABNORMAL HIGH (ref 70–99)
Phosphorus: 4 mg/dL (ref 2.5–4.6)
Potassium: 2.7 mmol/L — CL (ref 3.5–5.1)
Sodium: 139 mmol/L (ref 135–145)

## 2019-07-28 LAB — PROTIME-INR
INR: 2.4 — ABNORMAL HIGH (ref 0.8–1.2)
Prothrombin Time: 25.6 seconds — ABNORMAL HIGH (ref 11.4–15.2)

## 2019-07-28 LAB — CBC WITH DIFFERENTIAL/PLATELET
Abs Immature Granulocytes: 0.71 10*3/uL — ABNORMAL HIGH (ref 0.00–0.07)
Basophils Absolute: 0 10*3/uL (ref 0.0–0.1)
Basophils Relative: 0 %
Eosinophils Absolute: 0.1 10*3/uL (ref 0.0–0.5)
Eosinophils Relative: 1 %
HCT: 30.3 % — ABNORMAL LOW (ref 39.0–52.0)
Hemoglobin: 8.9 g/dL — ABNORMAL LOW (ref 13.0–17.0)
Immature Granulocytes: 5 %
Lymphocytes Relative: 20 %
Lymphs Abs: 3.1 10*3/uL (ref 0.7–4.0)
MCH: 28.3 pg (ref 26.0–34.0)
MCHC: 29.4 g/dL — ABNORMAL LOW (ref 30.0–36.0)
MCV: 96.2 fL (ref 80.0–100.0)
Monocytes Absolute: 1.1 10*3/uL — ABNORMAL HIGH (ref 0.1–1.0)
Monocytes Relative: 7 %
Neutro Abs: 10.4 10*3/uL — ABNORMAL HIGH (ref 1.7–7.7)
Neutrophils Relative %: 67 %
Platelets: 140 10*3/uL — ABNORMAL LOW (ref 150–400)
RBC: 3.15 MIL/uL — ABNORMAL LOW (ref 4.22–5.81)
RDW: 19.6 % — ABNORMAL HIGH (ref 11.5–15.5)
WBC: 15.5 10*3/uL — ABNORMAL HIGH (ref 4.0–10.5)
nRBC: 0.5 % — ABNORMAL HIGH (ref 0.0–0.2)

## 2019-07-28 LAB — POTASSIUM: Potassium: 3.5 mmol/L (ref 3.5–5.1)

## 2019-07-28 LAB — GLUCOSE, CAPILLARY
Glucose-Capillary: 106 mg/dL — ABNORMAL HIGH (ref 70–99)
Glucose-Capillary: 92 mg/dL (ref 70–99)

## 2019-07-28 LAB — HEPARIN LEVEL (UNFRACTIONATED): Heparin Unfractionated: 0.68 IU/mL (ref 0.30–0.70)

## 2019-07-28 LAB — MAGNESIUM: Magnesium: 1.9 mg/dL (ref 1.7–2.4)

## 2019-07-28 MED ORDER — POTASSIUM CHLORIDE CRYS ER 20 MEQ PO TBCR
40.0000 meq | EXTENDED_RELEASE_TABLET | Freq: Once | ORAL | Status: AC
  Administered 2019-07-28: 40 meq via ORAL
  Filled 2019-07-28: qty 2

## 2019-07-28 MED ORDER — DARBEPOETIN ALFA 200 MCG/0.4ML IJ SOSY: 200.0000 ug | PREFILLED_SYRINGE | INTRAMUSCULAR | 0 refills | Status: DC

## 2019-07-28 MED ORDER — POTASSIUM CHLORIDE ER 10 MEQ PO TBCR: 20.0000 meq | EXTENDED_RELEASE_TABLET | Freq: Every day | ORAL | 0 refills | Status: DC

## 2019-07-28 MED ORDER — CEFAZOLIN IV (FOR PTA / DISCHARGE USE ONLY): 1.0000 g | INTRAVENOUS | 0 refills | Status: AC

## 2019-07-28 MED ORDER — HYDROCODONE-ACETAMINOPHEN 5-325 MG PO TABS: 1.0000 | ORAL_TABLET | Freq: Three times a day (TID) | ORAL | 0 refills | Status: AC | PRN

## 2019-07-28 MED ORDER — PREDNISONE 5 MG PO TABS: ORAL_TABLET | ORAL | 0 refills | Status: DC

## 2019-07-28 MED ORDER — POLYSACCHARIDE IRON COMPLEX 150 MG PO CAPS: 150.0000 mg | ORAL_CAPSULE | Freq: Every day | ORAL | 0 refills | Status: DC

## 2019-07-28 MED ORDER — POLYSACCHARIDE IRON COMPLEX 150 MG PO CAPS
150.0000 mg | ORAL_CAPSULE | Freq: Every day | ORAL | Status: DC
  Administered 2019-07-28: 150 mg via ORAL
  Filled 2019-07-28: qty 1

## 2019-07-28 MED ORDER — WARFARIN SODIUM 2 MG PO TABS
2.0000 mg | ORAL_TABLET | Freq: Once | ORAL | Status: DC
  Filled 2019-07-28: qty 1

## 2019-07-28 MED FILL — POTASSIUM CHL ER M10 TABLET: 10 | 15 days supply | Qty: 30 | Fill #0

## 2019-07-28 MED FILL — predniSONE 5 MG TABS: 5 | 4 days supply | Qty: 6 | Fill #0

## 2019-07-28 MED FILL — HYDROCODON-APAP 5-325: 5-325 | 7 days supply | Qty: 21 | Fill #0

## 2019-07-28 MED FILL — POLY-IRON 150 MG CAPSULE: 150 | 30 days supply | Qty: 30 | Fill #0

## 2019-07-28 NOTE — Progress Notes (Signed)
[facility] Progress Note   Assessment/ Plan:   1. MSSA bacteremia: in setting of L hip hardware infection and R index finger osteo.  On Cefazolin.  TEE 6/23 no vegetation.  Repeat I and D 6/22  ID on board.  Has RIJ catheter for abx.  2.ESRD continue PD--> Continue all 2.5% tonight, this seems to be working well.  Would repeat potassium within one week with home training - asked him to set up an appt with home training unit for early next week  3. Hypokalemia - Will give 40 meq potassium today, 7/2 and repeat potassium level at 10am.  Needs 20 meq potassium daily on discharge and repeat potassium within one week with his home training unit  4. Anemia of CKD : continue ESA, increased Aranesp 200 mcg q Sunday  5. CKD-MBD: binders - phos acceptable   6. Hypertension: acceptable control    7.  R index finger osteo- distal phalanx amp, per ortho 6/25  8.  H/o DVT- on hep gtt--> anticoagulation per primary team   9. Nutrition: needs more protein--> on prostat; doesn't need to restrict potassium   10.  Suprapubic pain-  UA doesn't look grossly infected, culture with no growth.  Leukocytosis up to 25 s/p surgery and with pred taper.  PD fluid is all clear without fibrin, don't suspect peritonitis   11.  Dispo: per primary team.    Subjective:    UF for 7/1 AM 1587 mL.  He had 1 L UOP over 7/1 as well as 2 unmeasured voids.  States 2.5% going well.  He thinks he will be discharged today as he states they've been waiting on INR   Prior UF trends:  6/28 AM was 1442mL per charge HD report.   UF of 1673 mL for 6/29 UF for 6/30 AM never charted  UF for 7/1 AM 1587 mL   Review of systems:    Feels well Denies shortness of breath or chest pain  Denies n/v   Objective:   BP 130/77 (BP Location: Right Arm)   Pulse 69   Temp 97.9 F (36.6 C) (Oral)   Resp 18   Ht 5' 9.02" (1.753 m)   Wt 122.3 kg   SpO2 97%   BMI 39.80 kg/m   Physical Exam:   Gen: adult male in bed  in NAD  CVS: S1S2 no rub Resp: clear and unlabored on room air Abd soft obese habitus nontender Ext: 1+ LE edema left none RLE (s/p injury) ACCESS: PD catheter in place - bandage c/d/i; RIJ catheter  Neuro - alert and oriented x 3 follows commands and provides hx Psych - normal mood and affect  Labs: BMET Recent Labs  Lab 07/22/19 0456 07/23/19 0430 07/24/19 0500 07/25/19 0454 07/26/19 0441 07/27/19 0323 07/28/19 0425  NA 138 137 136 138 136 137 139  K 3.7 3.9 3.4* 3.2* 3.5 3.3* 2.7*  CL 101 100 99 100 101 101 101  CO2 23 26 25 26 25 26 25   GLUCOSE 157* 289* 236* 203* 227* 180* 114*  BUN 46* 42* 44* 44* 43* 47* 47*  CREATININE 9.32* 8.79* 8.74* 8.79* 9.10* 9.43* 9.21*  CALCIUM 9.2 9.0 9.0 9.0 9.0 9.0 9.0  PHOS 4.3 3.6 4.1 3.9 3.6 3.7 4.0   CBC Recent Labs  Lab 07/25/19 0454 07/26/19 0441 07/27/19 0323 07/28/19 0425  WBC 19.9* 17.6* 16.2* 15.5*  NEUTROABS 14.6* 13.3* 12.4* 10.4*  HGB 8.5* 8.5* 8.6* 8.9*  HCT 28.3* 28.9* 28.9*  30.3*  MCV 94.3 94.8 96.7 96.2  PLT 180 170 151 140*      Medications:    . amLODipine  5 mg Oral Daily  . atorvastatin  40 mg Oral q1800  . buPROPion  150 mg Oral BID WC  . Chlorhexidine Gluconate Cloth  6 each Topical Daily  . darbepoetin (ARANESP) injection - NON-DIALYSIS  200 mcg Subcutaneous Q Sun-1800  . feeding supplement (PRO-STAT SUGAR FREE 64)  30 mL Oral BID  . gentamicin cream  1 application Topical Daily  . insulin aspart  0-9 Units Subcutaneous Q6H  . insulin aspart  6 Units Subcutaneous TID WC  . insulin detemir  10 Units Subcutaneous Daily  . metoprolol tartrate  12.5 mg Oral BID  . pantoprazole  40 mg Oral Daily  . potassium chloride  40 mEq Oral Once  . predniSONE  10 mg Oral Q breakfast  . pregabalin  50 mg Oral QHS  . sevelamer carbonate  2,400 mg Oral TID WC  . sodium chloride flush  10-40 mL Intracatheter Q12H  . Warfarin - Pharmacist Dosing Inpatient   Does not apply q1600    [name] MD 07/28/2019,  6:24 AM

## 2019-07-28 NOTE — Progress Notes (Signed)
ANTICOAGULATION CONSULT NOTE - Follow Up Consult  Pharmacy Consult for Heparin/warfarin Indication: h/o DVT, recent CVA   Anticoag: PTA warfarin for hx DVT. 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 Orthopedic surgery to dictate when to start warfarin--s/p finger amputation 6/25. Hep resumed post-op.  Heparin level this am 0.73 units/ml on heparin drip rate 1700 units/hr, level slightly supratherapeutic.  Warfarin resumed on 07/25/19.  Patient with recent clot (per patient) in last 6 months. Pharmacist discussed anticoagulation with Dr. Sherral Hammers on 6/29, noting plan to keep patient inpatient and bridge with heparin to therapeutic INR. PTA Warfarin dose was 2 mg daily.  Today INR is 2.4, therapeutic.  Heparin level therapeutic on IV heparin bridge.   Hgb low stable and pltc wnl.  No bleeding reported.   Goal of Therapy:  INR 2-3 Heparin level 0.3-0.7 units/ml Monitor platelets by anticoagulation protocol: Yes   Plan:  Discontinue heparin infusion  Warfarin 2 mg po x1 today and continue 2mg  daily on discharge Daily HL, CBC, and Protime/INR while inpatient  Max Pittman A. Levada Dy, PharmD, BCPS, FNKF Clinical Pharmacist [place] Please utilize Amion for appropriate phone number to reach the unit pharmacist ([place])   07/28/2019 7:35 AM
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
## 2019-07-28 NOTE — Progress Notes (Signed)
PT Cancellation Note  Patient Details Name: Max Pittman. MRN: [id number] DOB: 12-15-1975   Cancelled Treatment:    Reason Eval/Treat Not Completed: Other (comment); patient reports not planning to work with PT today due to plans to go home.  Discussed home entry and has a ramp and girlfriend will be there to assist.  Reports has a ramp at home.  Did discuss options of platform for walker due to finger amputation, but unsure if walker he has will fit standard platform.  Asked him to discuss with HHPT if he feels he needs it.  Encouraged to continue to mobilize as HHPT may be delayed due to holiday.  Pt in agreement.    Reginia Naas 07/28/2019, 12:50 PM  Magda Kiel, PT Acute Rehabilitation Services Pager:[phone] Office:[phone] 07/28/2019

## 2019-07-28 NOTE — Plan of Care (Signed)
  Problem: Activity: Goal: Risk for activity intolerance will decrease Outcome: Progressing   

## 2019-07-28 NOTE — Progress Notes (Signed)
DISCHARGE NOTE HOME Max Pittman. to be discharged Home per MD order. Discussed prescriptions and follow up appointments with the patient. Prescriptions given to patient; medication list explained in detail. Patient verbalized understanding.  Skin clean, dry and intact without evidence of skin break down, no evidence of skin tears noted. IV catheter discontinued intact. Site without signs and symptoms of complications. Dressing and pressure applied. Pt denies pain at the site currently. No complaints noted.  Patient free of lines, drains, and wounds.   An After Visit Summary (AVS) was printed and given to the patient. Patient escorted via wheelchair, and discharged home via private auto.  Dolores Hoose, RN

## 2019-07-28 NOTE — TOC Transition Note (Signed)
Transition of Care [facility]) - CM/SW Discharge Note   Patient Details  Name: Max Pittman. MRN: [id number] Date of Birth: 1975/07/29  [facility] CM/SW Contact:  Bartholomew Crews, RN Phone Number: [phone] 07/28/2019, 12:27 PM   Clinical Narrative:     Patient to transition home today. Butch Penny with [facility] and Pam with [facility] notified. No further TOC needs identified.    Final next level of care: [place] Barriers to Discharge: No Barriers Identified   Patient Goals and CMS Choice Patient states their goals for this hospitalization and ongoing recovery are:: return home CMS Medicare.gov Compare Post Acute Care list provided to:: Patient Choice offered to / list presented to : Patient  Discharge Placement                       Discharge Plan and Services In-house Referral: NA Discharge Planning Services: CM Consult Post Acute Care Choice: Home Health          DME Arranged: N/A DME Agency: NA       HH Arranged: PT, RN [place] Agency: [place] ([place]) Date HH Agency Contacted: 07/28/19 Time HH Agency Contacted: 1000 Representative spoke with at [place]: [place] ([place]) Interventions     Readmission Risk Interventions No flowsheet data found.

## 2019-07-28 NOTE — Plan of Care (Signed)
  Problem: Clinical Measurements: Goal: Will remain free from infection Outcome: Adequate for Discharge   Problem: Activity: Goal: Risk for activity intolerance will decrease 07/28/2019 1435 by Dolores Hoose, RN Outcome: Adequate for Discharge 07/28/2019 1116 by Dolores Hoose, RN Outcome: Progressing   Problem: Skin Integrity: Goal: Risk for impaired skin integrity will decrease Outcome: Adequate for Discharge   Problem: Pain Managment: Goal: General experience of comfort will improve 07/28/2019 1435 by Dolores Hoose, RN Outcome: Adequate for Discharge 07/28/2019 1116 by Dolores Hoose, RN Outcome: Progressing

## 2019-07-31 NOTE — Op Note (Signed)
#  [id number] 

## 2019-07-31 NOTE — Op Note (Signed)
NAME: Max JR., Max Pittman XY:[id number] ACCOUNT [id number] DATE OF BIRTH:May 12, 1975 FACILITY: [facility] LOCATION: [facility] PHYSICIAN:Arista Kettlewell H. Bianney [place]wood, MD  OPERATIVE REPORT  DATE OF PROCEDURE:  07/14/2019  PREOPERATIVE DIAGNOSIS:  Left hip abscess.   POSTOPERATIVE DIAGNOSIS:  Left hip abscess.  PROCEDURE:   1.  Incision and drainage, left hip deep abscess. 2.  Placement of large wound VAC VeraFlo.  SURGEON:  Altamese [place], MD  ASSISTANT:  Ainsley Spinner, PA-C.  ANESTHESIA:  General.  COMPLICATIONS:  None.  INDICATIONS FOR PROCEDURE:  The patient is a 44 year old on peritoneal dialysis with multiple medical problems, including diabetes.  The patient developed progressive left hip pain and imaging demonstrated a fluid collection consistent with deep abscess.   We discussed with him the risks and benefits of surgical drainage through an incision to completely evacuate the area, including the potential for loose hardware and the need to remove this versus control of the infection and suppression until union.   Risks include need for further surgery with one of those being a planned return in 48-72 hours, probable placement of antibiotic beads at that time, and either closure or wound VAC dressing change under anesthesia.  The patient acknowledged these risks  and others and did provide consent to proceed.  BRIEF SUMMARY OF PROCEDURE:  The patient was taken to the operating room where general anesthesia was induced.  He was then positioned with the left side up.  Timeout was held.  Incision made, dissection carried down into the subcutaneous tissues where a  sizable collection of purulence was encountered.  This material was sent for anaerobic, aerobic culture, Gram stain.  We explored the entirety of the cavity.  I did not visualize any hardware directly.  I did use a curette to scrape the entirety of the  cavity, removing as much fibrinous material and any devitalized material  along the border.  It was then thoroughly irrigated with soap and 6000 mL of saline.  We then used fresh attire  and drapes and placed a VeraFlo wound VAC sponge using the largest  available to completely cover the area and then sealed it.  My assistant, Ainsley Spinner, PA-C, was present and assisting throughout.  The assistant was necessary for the deep dissection and exposure during the curettage.    PROGNOSIS:  We anticipate returning to the OR as planned in another 72 hours or so, at which time I would anticipate placing the antibiotic beads with or without primary wound closure which would at least be facilitated by a wound VAC at that time.  VN/NUANCE  D:07/31/2019 T:07/31/2019 JOB:[id number]

## 2019-08-07 ENCOUNTER — Ambulatory Visit (INDEPENDENT_AMBULATORY_CARE_PROVIDER_SITE_OTHER): Payer: Medicare Other | Admitting: *Deleted

## 2019-08-07 ENCOUNTER — Telehealth: Payer: Self-pay | Admitting: *Deleted

## 2019-08-07 DIAGNOSIS — Z5181 Encounter for therapeutic drug level monitoring: Secondary | ICD-10-CM | POA: Diagnosis not present

## 2019-08-07 DIAGNOSIS — I6349 Cerebral infarction due to embolism of other cerebral artery: Secondary | ICD-10-CM

## 2019-08-07 DIAGNOSIS — I824Y9 Acute embolism and thrombosis of unspecified deep veins of unspecified proximal lower extremity: Secondary | ICD-10-CM | POA: Diagnosis not present

## 2019-08-07 DIAGNOSIS — Z8673 Personal history of transient ischemic attack (TIA), and cerebral infarction without residual deficits: Secondary | ICD-10-CM | POA: Insufficient documentation

## 2019-08-07 DIAGNOSIS — S68110A Complete traumatic metacarpophalangeal amputation of right index finger, initial encounter: Secondary | ICD-10-CM | POA: Insufficient documentation

## 2019-08-07 DIAGNOSIS — I82409 Acute embolism and thrombosis of unspecified deep veins of unspecified lower extremity: Secondary | ICD-10-CM | POA: Insufficient documentation

## 2019-08-07 LAB — POCT INR: INR: 1.4 — AB (ref 2.0–3.0)

## 2019-08-07 NOTE — Patient Instructions (Signed)
D/C from hospital 10 days ago.  D/C INR was 2.4  D/C on 2mg  daily INR today 1.4.  Take warfarin 1 1/2 tablets tonight then increase dose to 1 tablet daily except 1 1/2 tablets on Wednesdays and Saturdays and recheck in 1 week.  Order given to [facility]

## 2019-08-07 NOTE — Telephone Encounter (Signed)
Done.  See coumadin note. 

## 2019-08-07 NOTE — Telephone Encounter (Signed)
INR 1.4 PT 16.2  Shannon with [place]

## 2019-08-09 ENCOUNTER — Ambulatory Visit (INDEPENDENT_AMBULATORY_CARE_PROVIDER_SITE_OTHER): Payer: Medicare Other | Admitting: Neurology

## 2019-08-09 ENCOUNTER — Encounter: Payer: Self-pay | Admitting: Neurology

## 2019-08-09 VITALS — BP 92/56 | HR 85 | Ht 69.0 in | Wt 252.4 lb

## 2019-08-09 DIAGNOSIS — I201 Angina pectoris with documented spasm: Secondary | ICD-10-CM

## 2019-08-09 DIAGNOSIS — I639 Cerebral infarction, unspecified: Secondary | ICD-10-CM

## 2019-08-09 DIAGNOSIS — R4020 Unspecified coma: Secondary | ICD-10-CM | POA: Diagnosis not present
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
## 2019-08-09 NOTE — Patient Instructions (Signed)
I had a long d/w patient about his recent cryptogenic stroke stroke, risk for recurrent stroke/TIAs, personally independently reviewed imaging studies and stroke evaluation results and answered questions.Continue aspirin 81 mg daily  for secondary stroke prevention and maintain strict control of hypertension with blood pressure goal below 130/90, diabetes with hemoglobin A1c goal below 6.5% and lipids with LDL cholesterol goal below 70 mg/dL. I also advised the patient to eat a healthy diet with plenty of whole grains, cereals, fruits and vegetables, exercise regularly and maintain ideal body weight .  Continue ongoing physical and occupational therapy and ambulate with a walker safely.  Check EEG for seizures.Followup in the future with my nurse practitioner Janett Billow in 3 months or call earlier if necessary.  Stroke Prevention Some medical conditions and behaviors are associated with a higher chance of having a stroke. You can help prevent a stroke by making nutrition, lifestyle, and other changes, including managing any medical conditions you may have. What nutrition changes can be made?   Eat healthy foods. You can do this by: ? Choosing foods high in fiber, such as fresh fruits and vegetables and whole grains. ? Eating at least 5 or more servings of fruits and vegetables a day. Try to fill half of your plate at each meal with fruits and vegetables. ? Choosing lean protein foods, such as lean cuts of meat, poultry without skin, fish, tofu, beans, and nuts. ? Eating low-fat dairy products. ? Avoiding foods that are high in salt (sodium). This can help lower blood pressure. ? Avoiding foods that have saturated fat, trans fat, and cholesterol. This can help prevent high cholesterol. ? Avoiding processed and premade foods.  Follow your health care provider's specific guidelines for losing weight, controlling high blood pressure (hypertension), lowering high cholesterol, and managing diabetes. These may  include: ? Reducing your daily calorie intake. ? Limiting your daily sodium intake to 1,500 milligrams (mg). ? Using only healthy fats for cooking, such as olive oil, canola oil, or sunflower oil. ? Counting your daily carbohydrate intake. What lifestyle changes can be made?  Maintain a healthy weight. Talk to your health care provider about your ideal weight.  Get at least 30 minutes of moderate physical activity at least 5 days a week. Moderate activity includes brisk walking, biking, and swimming.  Do not use any products that contain nicotine or tobacco, such as cigarettes and e-cigarettes. If you need help quitting, ask your health care provider. It may also be helpful to avoid exposure to secondhand smoke.  Limit alcohol intake to no more than 1 drink a day for nonpregnant women and 2 drinks a day for men. One drink equals 12 oz of beer, 5 oz of wine, or 1 oz of hard liquor.  Stop any illegal drug use.  Avoid taking birth control pills. Talk to your health care provider about the risks of taking birth control pills if: ? You are over 28 years old. ? You smoke. ? You get migraines. ? You have ever had a blood clot. What other changes can be made?  Manage your cholesterol levels. ? Eating a healthy diet is important for preventing high cholesterol. If cholesterol cannot be managed through diet alone, you may also need to take medicines. ? Take any prescribed medicines to control your cholesterol as told by your health care provider.  Manage your diabetes. ? Eating a healthy diet and exercising regularly are important parts of managing your blood sugar. If your blood sugar cannot be managed  through diet and exercise, you may need to take medicines. ? Take any prescribed medicines to control your diabetes as told by your health care provider.  Control your hypertension. ? To reduce your risk of stroke, try to keep your blood pressure below 130/80. ? Eating a healthy diet and  exercising regularly are an important part of controlling your blood pressure. If your blood pressure cannot be managed through diet and exercise, you may need to take medicines. ? Take any prescribed medicines to control hypertension as told by your health care provider. ? Ask your health care provider if you should monitor your blood pressure at home. ? Have your blood pressure checked every year, even if your blood pressure is normal. Blood pressure increases with age and some medical conditions.  Get evaluated for sleep disorders (sleep apnea). Talk to your health care provider about getting a sleep evaluation if you snore a lot or have excessive sleepiness.  Take over-the-counter and prescription medicines only as told by your health care provider. Aspirin or blood thinners (antiplatelets or anticoagulants) may be recommended to reduce your risk of forming blood clots that can lead to stroke.  Make sure that any other medical conditions you have, such as atrial fibrillation or atherosclerosis, are managed. What are the warning signs of a stroke? The warning signs of a stroke can be easily remembered as BEFAST.  B is for balance. Signs include: ? Dizziness. ? Loss of balance or coordination. ? Sudden trouble walking.  E is for eyes. Signs include: ? A sudden change in vision. ? Trouble seeing.  F is for face. Signs include: ? Sudden weakness or numbness of the face. ? The face or eyelid drooping to one side.  A is for arms. Signs include: ? Sudden weakness or numbness of the arm, usually on one side of the body.  S is for speech. Signs include: ? Trouble speaking (aphasia). ? Trouble understanding.  T is for time. ? These symptoms may represent a serious problem that is an emergency. Do not wait to see if the symptoms will go away. Get medical help right away. Call your local emergency services (911 in the [place] Do not drive yourself to the hospital.  Other signs of stroke  may include: ? A sudden, severe headache with no known cause. ? Nausea or vomiting. ? Seizure. Where to find more information For more information, visit:  American Stroke Association: www.strokeassociation.org  National Stroke Association: www.stroke.org Summary  You can prevent a stroke by eating healthy, exercising, not smoking, limiting alcohol intake, and managing any medical conditions you may have.  Do not use any products that contain nicotine or tobacco, such as cigarettes and e-cigarettes. If you need help quitting, ask your health care provider. It may also be helpful to avoid exposure to secondhand smoke.  Remember BEFAST for warning signs of stroke. Get help right away if you or a loved one has any of these signs. This information is not intended to replace advice given to you by your health care provider. Make sure you discuss any questions you have with your health care provider. Document Revised: 12/25/2016 Document Reviewed: 02/18/2016 Elsevier Patient Education  2020 [employer].

## 2019-08-14 ENCOUNTER — Ambulatory Visit (INDEPENDENT_AMBULATORY_CARE_PROVIDER_SITE_OTHER): Payer: Medicare Other | Admitting: *Deleted

## 2019-08-14 ENCOUNTER — Telehealth: Payer: Self-pay | Admitting: *Deleted

## 2019-08-14 DIAGNOSIS — Z5181 Encounter for therapeutic drug level monitoring: Secondary | ICD-10-CM

## 2019-08-14 DIAGNOSIS — I6349 Cerebral infarction due to embolism of other cerebral artery: Secondary | ICD-10-CM

## 2019-08-14 LAB — POCT INR: INR: 2.2 (ref 2.0–3.0)

## 2019-08-14 NOTE — Telephone Encounter (Signed)
[place] w/ [facility]  PT- 26.3 INR 2.2  Please call [facility] @ [phone]

## 2019-08-14 NOTE — Patient Instructions (Signed)
Continue warfarin 1 tablet daily except 1 1/2 tablets on Wednesdays and Saturdays and recheck in 1 week.  Order given to [facility]

## 2019-08-14 NOTE — Telephone Encounter (Signed)
Done.  See coumadin note. 

## 2019-08-21 ENCOUNTER — Ambulatory Visit (INDEPENDENT_AMBULATORY_CARE_PROVIDER_SITE_OTHER): Payer: Medicare Other | Admitting: *Deleted

## 2019-08-21 DIAGNOSIS — Z5181 Encounter for therapeutic drug level monitoring: Secondary | ICD-10-CM | POA: Diagnosis not present

## 2019-08-21 DIAGNOSIS — I824Y9 Acute embolism and thrombosis of unspecified deep veins of unspecified proximal lower extremity: Secondary | ICD-10-CM

## 2019-08-21 LAB — POCT INR: INR: 1.5 — AB (ref 2.0–3.0)

## 2019-08-21 MED ORDER — WARFARIN SODIUM 2 MG PO TABS: ORAL_TABLET | ORAL | 4 refills | Status: DC

## 2019-08-21 NOTE — Patient Instructions (Signed)
Take warfarin 2 tablets tonight, then increase dose to 1 1/2 tablets daily except 1 tablet on Mondays, Wednesdays and Fridays and recheck in 1 week.  Order given to [place] [facility]

## 2019-08-23 ENCOUNTER — Telehealth (HOSPITAL_COMMUNITY): Payer: Self-pay

## 2019-08-23 ENCOUNTER — Other Ambulatory Visit: Payer: Self-pay

## 2019-08-23 ENCOUNTER — Telehealth: Payer: Medicare Other | Admitting: Internal Medicine

## 2019-08-23 ENCOUNTER — Other Ambulatory Visit (HOSPITAL_COMMUNITY): Payer: Self-pay | Admitting: Infectious Diseases

## 2019-08-23 ENCOUNTER — Telehealth: Payer: Self-pay

## 2019-08-23 DIAGNOSIS — M869 Osteomyelitis, unspecified: Secondary | ICD-10-CM

## 2019-08-23 MED ORDER — CEPHALEXIN 250 MG PO CAPS: 250.0000 mg | ORAL_CAPSULE | Freq: Two times a day (BID) | ORAL | 2 refills | Status: DC

## 2019-08-23 NOTE — Telephone Encounter (Addendum)
  Received VM from [facility] requesting antibiotic/picc updated orders. [facility] staff has been trying to reach patient to schedule hospital follow up visit. LPN was able to reach patient and schedule with NP next week.  Per Janene Madeira d/c tunneled picc. Start Keflex 250 PO Q12 hour #60 with 2refills. Gave verbal order to [facility] with [facility] to d/c IV therapy after therapy completion. Order read back and understood.   Also made patient aware of plan and agrees  LPN place call to IR left voicemail with Caryl Pina for patient scheduling. Will to continue to follow.   [name]

## 2019-08-23 NOTE — Telephone Encounter (Signed)
Returned call to [facility] to schedule picc removal, office closed for the day. AW

## 2019-08-24 ENCOUNTER — Telehealth (HOSPITAL_COMMUNITY): Payer: Self-pay

## 2019-08-24 DIAGNOSIS — M79641 Pain in right hand: Secondary | ICD-10-CM | POA: Insufficient documentation

## 2019-08-24 NOTE — Telephone Encounter (Signed)
Called to schedule catheter removal, no answer, left vm. AW  

## 2019-08-24 NOTE — Telephone Encounter (Signed)
Patient scheduled with IR for tunneled picc removal 8/2. Max Pittman with OR made patient aware of appointment date/time. [name]'

## 2019-08-25 ENCOUNTER — Ambulatory Visit (INDEPENDENT_AMBULATORY_CARE_PROVIDER_SITE_OTHER): Payer: Medicare Other | Admitting: *Deleted

## 2019-08-25 DIAGNOSIS — R55 Syncope and collapse: Secondary | ICD-10-CM | POA: Diagnosis not present

## 2019-08-25 LAB — CUP PACEART REMOTE DEVICE CHECK
Date Time Interrogation Session: 20210729202242
Implantable Pulse Generator Implant Date: 20210429

## 2019-08-28 ENCOUNTER — Ambulatory Visit (HOSPITAL_COMMUNITY)
Admission: RE | Admit: 2019-08-28 | Discharge: 2019-08-28 | Disposition: A | Discharge status: Home / Self Care | Payer: Medicare Other | Source: Ambulatory Visit | Attending: Infectious Diseases | Admitting: Infectious Diseases

## 2019-08-28 ENCOUNTER — Other Ambulatory Visit: Payer: Self-pay

## 2019-08-28 DIAGNOSIS — E1169 Type 2 diabetes mellitus with other specified complication: Secondary | ICD-10-CM | POA: Diagnosis not present

## 2019-08-28 DIAGNOSIS — Z452 Encounter for adjustment and management of vascular access device: Secondary | ICD-10-CM | POA: Diagnosis present

## 2019-08-28 DIAGNOSIS — M869 Osteomyelitis, unspecified: Secondary | ICD-10-CM | POA: Diagnosis not present

## 2019-08-28 HISTORY — PX: IR FLUORO GUIDE CV LINE RIGHT: IMG2283

## 2019-08-28 MED ORDER — CHLORHEXIDINE GLUCONATE 4 % EX LIQD
CUTANEOUS | Status: AC
  Filled 2019-08-28: qty 15

## 2019-08-28 MED ORDER — LIDOCAINE HCL 1 % IJ SOLN
INTRAMUSCULAR | Status: AC
  Filled 2019-08-28: qty 20

## 2019-08-28 MED ORDER — LIDOCAINE HCL 1 % IJ SOLN
INTRAMUSCULAR | Status: DC | PRN
  Administered 2019-08-28: 10 mL

## 2019-08-28 NOTE — Procedures (Signed)
PROCEDURE SUMMARY:  Successful removal of tunneled catheter.  Patient tolerated well.  EBL < 5 mL  See full dictation in Imaging for details.  Malayah Demuro S Elizabella Nolet PA-C 08/28/2019 12:52 PM

## 2019-08-29 ENCOUNTER — Other Ambulatory Visit (HOSPITAL_COMMUNITY): Payer: Self-pay | Admitting: Infectious Diseases

## 2019-08-29 ENCOUNTER — Telehealth (INDEPENDENT_AMBULATORY_CARE_PROVIDER_SITE_OTHER): Payer: Medicare Other | Admitting: Infectious Diseases

## 2019-08-29 ENCOUNTER — Ambulatory Visit (INDEPENDENT_AMBULATORY_CARE_PROVIDER_SITE_OTHER): Payer: Medicare Other | Admitting: *Deleted

## 2019-08-29 ENCOUNTER — Encounter (HOSPITAL_COMMUNITY): Payer: Self-pay

## 2019-08-29 DIAGNOSIS — Z452 Encounter for adjustment and management of vascular access device: Secondary | ICD-10-CM

## 2019-08-29 DIAGNOSIS — Z792 Long term (current) use of antibiotics: Secondary | ICD-10-CM | POA: Diagnosis not present

## 2019-08-29 DIAGNOSIS — I824Y9 Acute embolism and thrombosis of unspecified deep veins of unspecified proximal lower extremity: Secondary | ICD-10-CM | POA: Diagnosis not present

## 2019-08-29 DIAGNOSIS — M869 Osteomyelitis, unspecified: Secondary | ICD-10-CM

## 2019-08-29 DIAGNOSIS — R7881 Bacteremia: Secondary | ICD-10-CM

## 2019-08-29 DIAGNOSIS — B9561 Methicillin susceptible Staphylococcus aureus infection as the cause of diseases classified elsewhere: Secondary | ICD-10-CM

## 2019-08-29 DIAGNOSIS — T847XXD Infection and inflammatory reaction due to other internal orthopedic prosthetic devices, implants and grafts, subsequent encounter: Secondary | ICD-10-CM

## 2019-08-29 DIAGNOSIS — N186 End stage renal disease: Secondary | ICD-10-CM

## 2019-08-29 DIAGNOSIS — Z5181 Encounter for therapeutic drug level monitoring: Secondary | ICD-10-CM | POA: Diagnosis not present

## 2019-08-29 DIAGNOSIS — M86141 Other acute osteomyelitis, right hand: Secondary | ICD-10-CM

## 2019-08-29 HISTORY — PX: IR REMOVAL TUN CV CATH W/O FL: IMG2289

## 2019-08-29 LAB — POCT INR: INR: 1.5 — AB (ref 2.0–3.0)

## 2019-08-29 NOTE — Progress Notes (Signed)
[facility] Summary Report / Loop Recorder 

## 2019-08-29 NOTE — Patient Instructions (Signed)
Increase warfarin to 1 1/2 tablets daily and recheck in 1 week.  Order given to [facility]
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
## 2019-09-04 ENCOUNTER — Ambulatory Visit (INDEPENDENT_AMBULATORY_CARE_PROVIDER_SITE_OTHER): Payer: Medicare Other | Admitting: *Deleted

## 2019-09-04 ENCOUNTER — Telehealth: Payer: Self-pay | Admitting: *Deleted

## 2019-09-04 DIAGNOSIS — Z5181 Encounter for therapeutic drug level monitoring: Secondary | ICD-10-CM

## 2019-09-04 DIAGNOSIS — I6349 Cerebral infarction due to embolism of other cerebral artery: Secondary | ICD-10-CM

## 2019-09-04 LAB — POCT INR: INR: 1.8 — AB (ref 2.0–3.0)

## 2019-09-04 NOTE — Telephone Encounter (Signed)
Orders given to [facility].  See coumadin note.

## 2019-09-04 NOTE — Patient Instructions (Signed)
Increase warfarin to 1 1/2 tablets daily except 2 tablets on Mondays and Thursdays and recheck in 1 week.  Order given to [facility]

## 2019-09-04 NOTE — Telephone Encounter (Signed)
[facility] with [place] [phone])  PT 22.1 INR 1.8

## 2019-09-05 ENCOUNTER — Other Ambulatory Visit: Payer: Self-pay

## 2019-09-05 ENCOUNTER — Ambulatory Visit (INDEPENDENT_AMBULATORY_CARE_PROVIDER_SITE_OTHER): Payer: Medicare Other | Admitting: Neurology

## 2019-09-05 DIAGNOSIS — I639 Cerebral infarction, unspecified: Secondary | ICD-10-CM

## 2019-09-05 DIAGNOSIS — R4020 Unspecified coma: Secondary | ICD-10-CM | POA: Diagnosis not present

## 2019-09-07 ENCOUNTER — Other Ambulatory Visit: Payer: Self-pay | Admitting: Neurology

## 2019-09-07 ENCOUNTER — Telehealth: Payer: Self-pay

## 2019-09-07 MED ORDER — LEVETIRACETAM 500 MG PO TABS: 500.0000 mg | ORAL_TABLET | Freq: Two times a day (BID) | ORAL | 11 refills | Status: DC

## 2019-09-07 NOTE — Progress Notes (Signed)
I called and left a message on patient's answering machine to call me back to discuss results of his EEG.  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

## 2019-09-07 NOTE — Telephone Encounter (Signed)
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 Dr Leonie Man would like to start him on Keppra XR 500 mg daily. He has sent Rx to his pharmacy. I advised he start medication and call for any questions or problems. Patient verbalized understanding, appreciation.

## 2019-09-07 NOTE — Telephone Encounter (Signed)
Pt calling back, states he missed a call from Dr Leonie Man

## 2019-09-09 NOTE — Telephone Encounter (Signed)
thanks

## 2019-09-10 DIAGNOSIS — Z792 Long term (current) use of antibiotics: Secondary | ICD-10-CM | POA: Insufficient documentation

## 2019-09-10 DIAGNOSIS — Z452 Encounter for adjustment and management of vascular access device: Secondary | ICD-10-CM | POA: Insufficient documentation

## 2019-09-10 NOTE — Assessment & Plan Note (Signed)
All OPAT lab work reviewed and within normal limits. Normalization of inflammatory markers. No side effects to cefazolin identified during treatment.

## 2019-09-10 NOTE — Assessment & Plan Note (Signed)
S/P definitive partial finger amputation.  Resolved.

## 2019-09-10 NOTE — Assessment & Plan Note (Signed)
There are no concerning findings for any relapsing bacteremia today. TEE was obtained inpatient and r/o any endocardial involvement.  He continues on suppressive Cephalexin for hardware infection.

## 2019-09-10 NOTE — Assessment & Plan Note (Signed)
We had his tunneled HD catheter pulled while inpatient d/t bacteremia. He continues to tolerate peritoneal dialysis.

## 2019-09-10 NOTE — Assessment & Plan Note (Signed)
Will arrange for tunneled PICC removal in IR

## 2019-09-10 NOTE — Assessment & Plan Note (Signed)
S/P I&D of large abscess involving the left hip after extensive hardware was placed following trauma. 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 He is following with Dr. Marcelino Scot - it sounds like his incision is well healed but may have some loosening of hardware. He will continue on renally dosed cephalexin 250 mg BID for suppression of infection until further determinations are made regarding possible removal of hardware. He will return in 4-6 weeks with Dr. Megan Salon to follow up on plan.

## 2019-09-11 ENCOUNTER — Ambulatory Visit (INDEPENDENT_AMBULATORY_CARE_PROVIDER_SITE_OTHER): Payer: Medicare Other | Admitting: *Deleted

## 2019-09-11 DIAGNOSIS — Z5181 Encounter for therapeutic drug level monitoring: Secondary | ICD-10-CM

## 2019-09-11 DIAGNOSIS — I6349 Cerebral infarction due to embolism of other cerebral artery: Secondary | ICD-10-CM | POA: Diagnosis not present

## 2019-09-11 LAB — POCT INR: INR: 2 (ref 2.0–3.0)

## 2019-09-11 NOTE — Patient Instructions (Signed)
Increase warfarin to 1 1/2 tablets daily except 2 tablets on Mondays, Wednesdays and Fridays.  Recheck in 2 week.  Order given to [facility].  Pt being discharged from [facility] today.  Next INR in the office. LMOM with appt on 8/30 @ 2pm along with contact information to office.

## 2019-09-25 ENCOUNTER — Ambulatory Visit (INDEPENDENT_AMBULATORY_CARE_PROVIDER_SITE_OTHER): Payer: Medicare Other | Admitting: *Deleted

## 2019-09-25 DIAGNOSIS — I6349 Cerebral infarction due to embolism of other cerebral artery: Secondary | ICD-10-CM

## 2019-09-25 DIAGNOSIS — Z5181 Encounter for therapeutic drug level monitoring: Secondary | ICD-10-CM

## 2019-09-25 LAB — POCT INR: INR: 1.9 — AB (ref 2.0–3.0)

## 2019-09-25 NOTE — Patient Instructions (Signed)
Increase warfarin to 2 tablets daily except 1 1/2 tablets on Sundays and Wednesdays.  Recheck in 2 weeks

## 2019-09-27 ENCOUNTER — Ambulatory Visit (INDEPENDENT_AMBULATORY_CARE_PROVIDER_SITE_OTHER): Payer: Medicare Other | Admitting: *Deleted

## 2019-09-27 DIAGNOSIS — I63019 Cerebral infarction due to thrombosis of unspecified vertebral artery: Secondary | ICD-10-CM

## 2019-09-27 LAB — CUP PACEART REMOTE DEVICE CHECK
Date Time Interrogation Session: 20210831202328
Implantable Pulse Generator Implant Date: 20210429

## 2019-09-29 NOTE — Progress Notes (Signed)
[facility] Summary Report / Loop Recorder 

## 2019-10-05 ENCOUNTER — Ambulatory Visit: Payer: Medicare Other | Admitting: Internal Medicine

## 2019-10-05 ENCOUNTER — Other Ambulatory Visit: Payer: Self-pay | Admitting: Orthopedic Surgery

## 2019-10-05 DIAGNOSIS — S32402D Unspecified fracture of left acetabulum, subsequent encounter for fracture with routine healing: Secondary | ICD-10-CM

## 2019-10-11 ENCOUNTER — Ambulatory Visit (INDEPENDENT_AMBULATORY_CARE_PROVIDER_SITE_OTHER): Payer: Medicare Other | Admitting: *Deleted

## 2019-10-11 ENCOUNTER — Other Ambulatory Visit: Payer: Self-pay

## 2019-10-11 DIAGNOSIS — Z5181 Encounter for therapeutic drug level monitoring: Secondary | ICD-10-CM | POA: Diagnosis not present

## 2019-10-11 DIAGNOSIS — I6349 Cerebral infarction due to embolism of other cerebral artery: Secondary | ICD-10-CM

## 2019-10-11 LAB — POCT INR: INR: 2.5 (ref 2.0–3.0)

## 2019-10-11 NOTE — Patient Instructions (Signed)
Continue warfarin 2 tablets daily except 1 1/2 tablets on Sundays and Wednesdays.  Recheck in 3 weeks

## 2019-10-16 ENCOUNTER — Ambulatory Visit (INDEPENDENT_AMBULATORY_CARE_PROVIDER_SITE_OTHER): Payer: Medicare Other

## 2019-10-16 ENCOUNTER — Encounter: Payer: Self-pay | Admitting: Infectious Diseases

## 2019-10-16 ENCOUNTER — Ambulatory Visit: Payer: Medicare Other

## 2019-10-16 ENCOUNTER — Other Ambulatory Visit: Payer: Self-pay

## 2019-10-16 ENCOUNTER — Ambulatory Visit (INDEPENDENT_AMBULATORY_CARE_PROVIDER_SITE_OTHER): Payer: Medicare Other | Admitting: Infectious Diseases

## 2019-10-16 VITALS — BP 107/72 | HR 102 | Temp 98.1°F | Ht 69.0 in | Wt 264.0 lb

## 2019-10-16 DIAGNOSIS — I201 Angina pectoris with documented spasm: Secondary | ICD-10-CM

## 2019-10-16 DIAGNOSIS — Z7189 Other specified counseling: Secondary | ICD-10-CM | POA: Diagnosis not present

## 2019-10-16 DIAGNOSIS — T847XXD Infection and inflammatory reaction due to other internal orthopedic prosthetic devices, implants and grafts, subsequent encounter: Secondary | ICD-10-CM | POA: Diagnosis not present

## 2019-10-16 DIAGNOSIS — R7881 Bacteremia: Secondary | ICD-10-CM | POA: Diagnosis not present

## 2019-10-16 DIAGNOSIS — Z23 Encounter for immunization: Secondary | ICD-10-CM | POA: Diagnosis not present

## 2019-10-16 DIAGNOSIS — B9561 Methicillin susceptible Staphylococcus aureus infection as the cause of diseases classified elsewhere: Secondary | ICD-10-CM

## 2019-10-16 DIAGNOSIS — Z5181 Encounter for therapeutic drug level monitoring: Secondary | ICD-10-CM

## 2019-10-16 DIAGNOSIS — Z7185 Encounter for immunization safety counseling: Secondary | ICD-10-CM | POA: Insufficient documentation

## 2019-10-16 MED ORDER — CEPHALEXIN 250 MG PO CAPS: 250.0000 mg | ORAL_CAPSULE | Freq: Two times a day (BID) | ORAL | 2 refills | Status: DC
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
## 2019-10-16 NOTE — Assessment & Plan Note (Signed)
We discussed the Covid vaccine today including efficacy, risks, side effects.  We will proceed with vaccinating with [place] today with the second dose in 3 weeks.  I asked him to monitor side effects and use Tylenol as needed over the next 48 hours.  Happy to talk with him further over the phone if he has side effects that last beyond this or are concerning to him. Would also recommend flu vaccine at completion of the series.

## 2019-10-16 NOTE — Patient Instructions (Signed)
Nice to see that you are doing well.  Please continue taking your antibiotic twice a day -we will need to continue this until your upcoming surgery.  I would like to check some blood work today that Dr. Marcelino Scot and I will use to help monitor your infection.  I will make sure Dr. Marcelino Scot has these results as well.  Please send me an update as to when your surgery is scheduled.  For now we will plan on having you return in 2 months for ongoing monitoring.  We can change this if your surgery plans are different.

## 2019-10-16 NOTE — Assessment & Plan Note (Addendum)
Mr. Soter is doing well on suppressive cephalexin for his left hip infection.  It sounds like the hardware is unstable and surgical intervention for total hip arthroplasty is in process.  We will check CRP, ESR and CBC today for therapeutic monitoring of his infection.  Would consider aspirating the joint prior to surgery, especially if his markers return elevated.  Cristoval is going to send me a message once surgery plans are set. Based on condition of bone may need to repeat induction of IV therapy for him given - we discussed this possibility today.  For now we will refill his cephalexin at current dose and have him return in 2 months

## 2019-10-16 NOTE — Assessment & Plan Note (Addendum)
No concerning signs of relapsing bacteremia.  Resolved.

## 2019-10-16 NOTE — Progress Notes (Signed)
Immunization Encounter: Pt was observed by [name], LPN Pt was without symptoms. [name]

## 2019-10-17 ENCOUNTER — Ambulatory Visit
Admission: RE | Admit: 2019-10-17 | Discharge: 2019-10-17 | Disposition: A | Discharge status: Home / Self Care | Payer: BLUE CROSS/BLUE SHIELD | Source: Ambulatory Visit | Attending: Orthopedic Surgery | Admitting: Orthopedic Surgery

## 2019-10-17 DIAGNOSIS — S32402D Unspecified fracture of left acetabulum, subsequent encounter for fracture with routine healing: Secondary | ICD-10-CM

## 2019-10-17 LAB — CBC WITH DIFFERENTIAL/PLATELET
Absolute Monocytes: 1096 cells/uL — ABNORMAL HIGH (ref 200–950)
Basophils Absolute: 58 cells/uL (ref 0–200)
Basophils Relative: 0.7 %
Eosinophils Absolute: 315 cells/uL (ref 15–500)
Eosinophils Relative: 3.8 %
HCT: 32.7 % — ABNORMAL LOW (ref 38.5–50.0)
Hemoglobin: 10 g/dL — ABNORMAL LOW (ref 13.2–17.1)
Lymphs Abs: 1560 cells/uL (ref 850–3900)
MCH: 26.1 pg — ABNORMAL LOW (ref 27.0–33.0)
MCHC: 30.6 g/dL — ABNORMAL LOW (ref 32.0–36.0)
MCV: 85.4 fL (ref 80.0–100.0)
MPV: 8.9 fL (ref 7.5–12.5)
Monocytes Relative: 13.2 %
Neutro Abs: 5271 cells/uL (ref 1500–7800)
Neutrophils Relative %: 63.5 %
Platelets: 362 10*3/uL (ref 140–400)
RBC: 3.83 10*6/uL — ABNORMAL LOW (ref 4.20–5.80)
RDW: 14.8 % (ref 11.0–15.0)
Total Lymphocyte: 18.8 %
WBC: 8.3 10*3/uL (ref 3.8–10.8)

## 2019-10-17 LAB — C-REACTIVE PROTEIN: CRP: 156 mg/L — ABNORMAL HIGH (ref <8.0)

## 2019-10-17 LAB — SEDIMENTATION RATE: Sed Rate: >130 mm/h — ABNORMAL HIGH (ref 0–15)

## 2019-10-17 IMAGING — CT CT PELVIS W/O CM
2 series · 12 of 32 positions shown, 18 images · non-contrast
Comparison: [DATE]

CLINICAL DATA: Left hip fracture, pain and swelling of left leg,
previous surgery

EXAM:
CT PELVIS WITHOUT CONTRAST
TECHNIQUE: Multidetector CT imaging of the pelvis was performed following the
standard protocol without intravenous contrast.

[Series 2: soft tissue pelvis without (person_name) · axial · non-contrast · 0.98mm/px · z∈[-199,-9]mm · 10 of 48 slices shown, 16 images]
[im 5/48  soft-tissue]
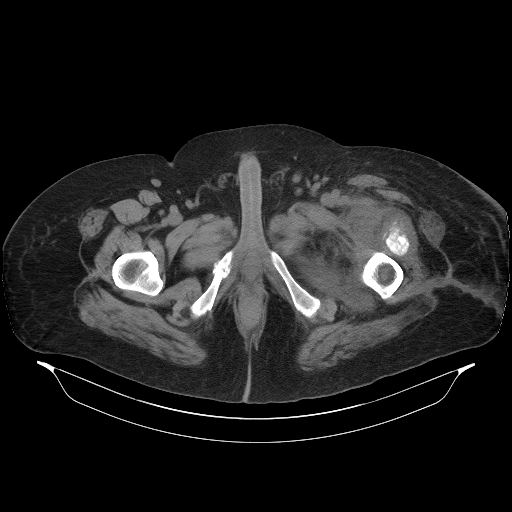
[im 5/48  bone]
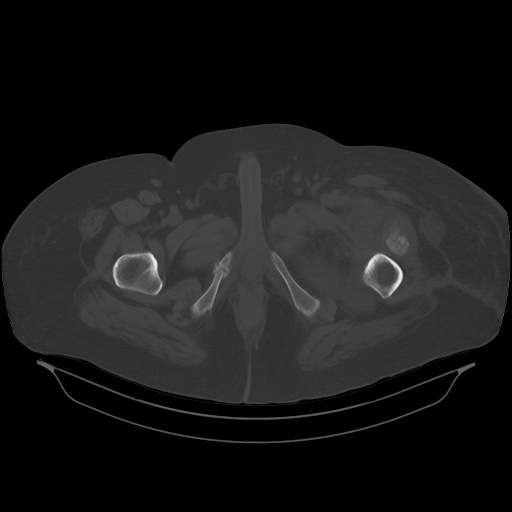
[im 9/48  soft-tissue]
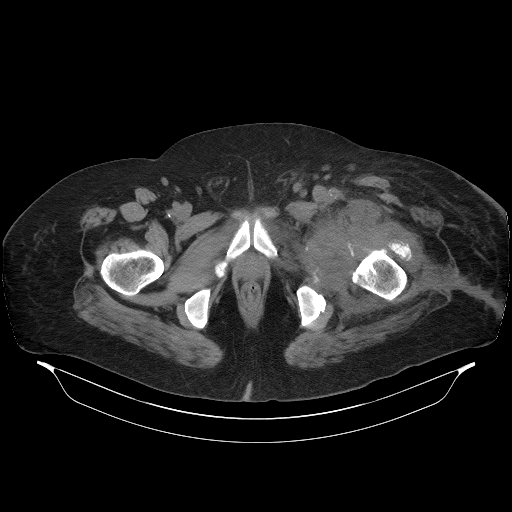
[im 13/48  soft-tissue]
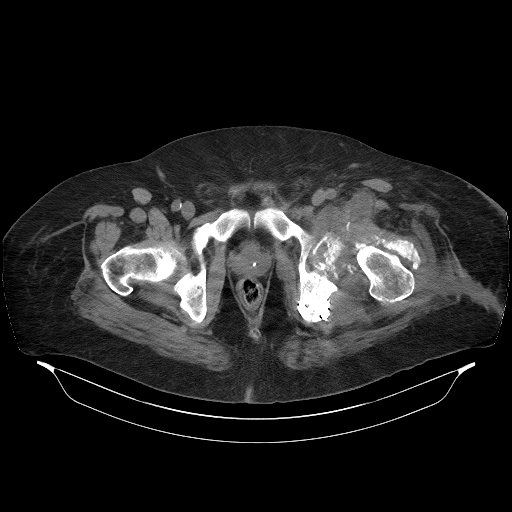
[im 18/48  soft-tissue]
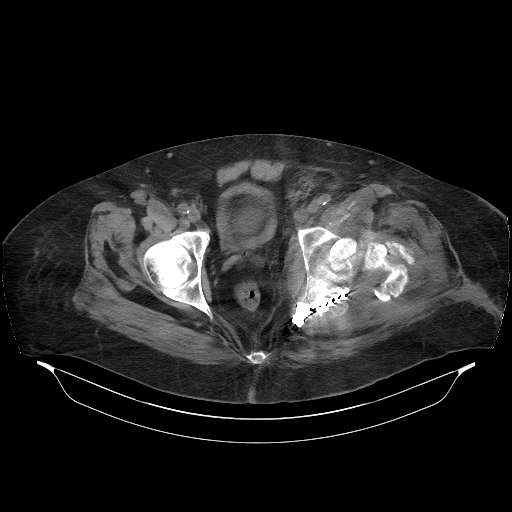
[im 22/48  soft-tissue]
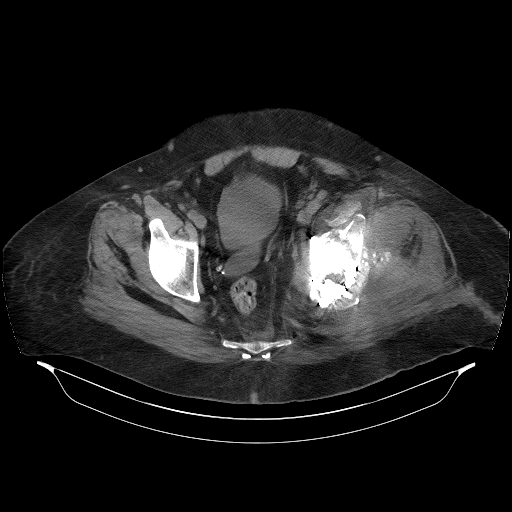
[im 26/48  soft-tissue]
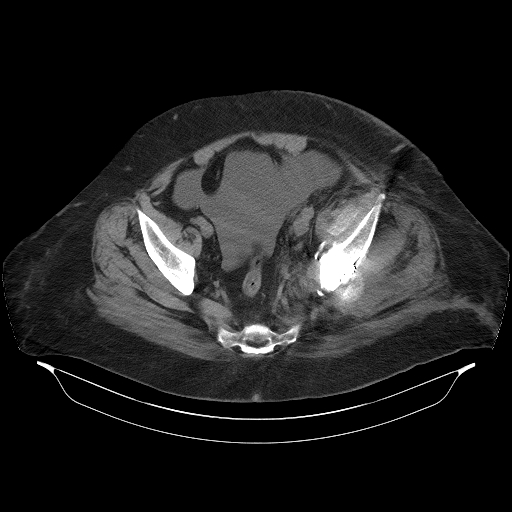
[im 30/48  soft-tissue]
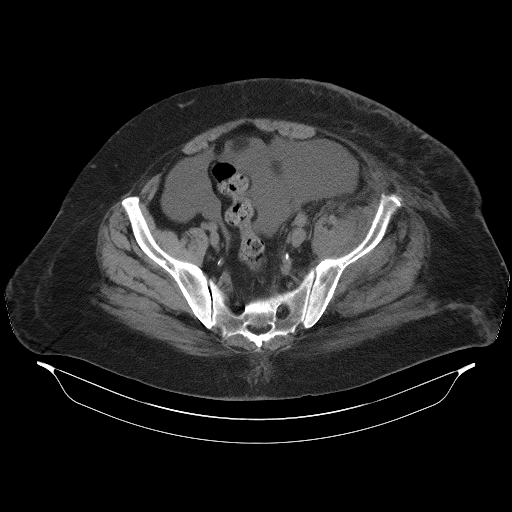
[im 30/48  lung]
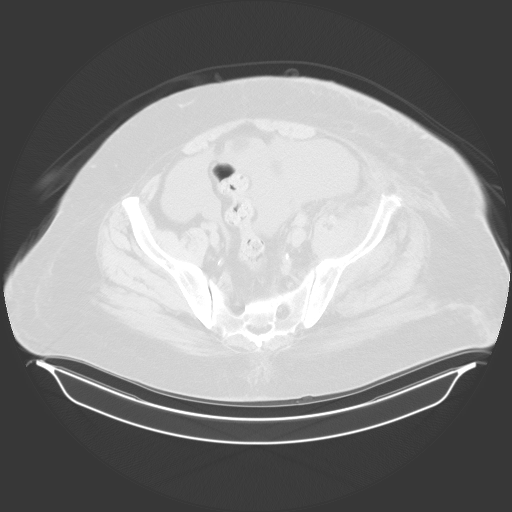
[im 35/48  soft-tissue]
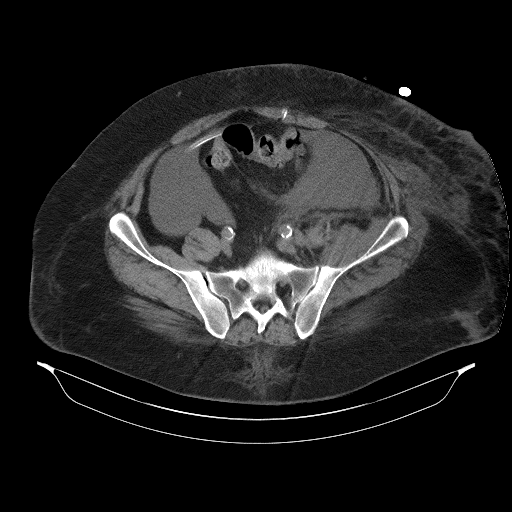
[im 35/48  lung]
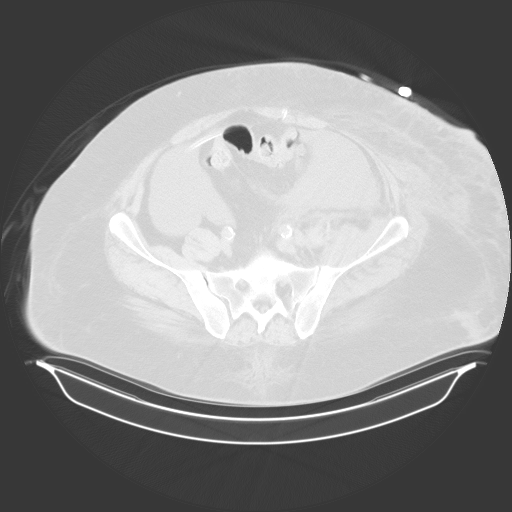
[im 39/48  soft-tissue]
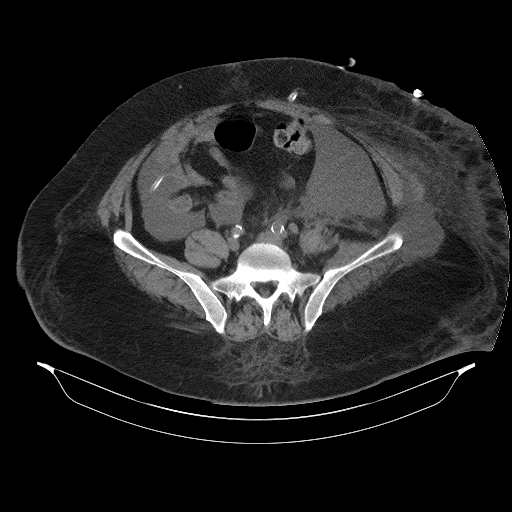
[im 39/48  lung]
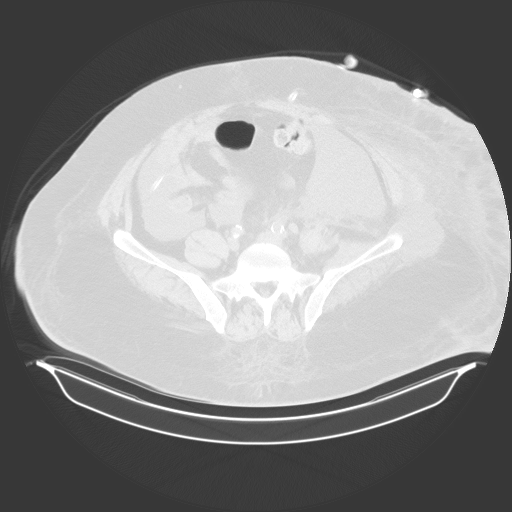
[im 39/48  bone]
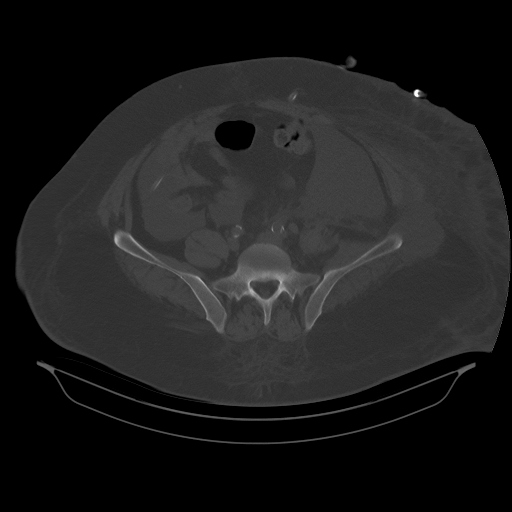
[im 43/48  soft-tissue]
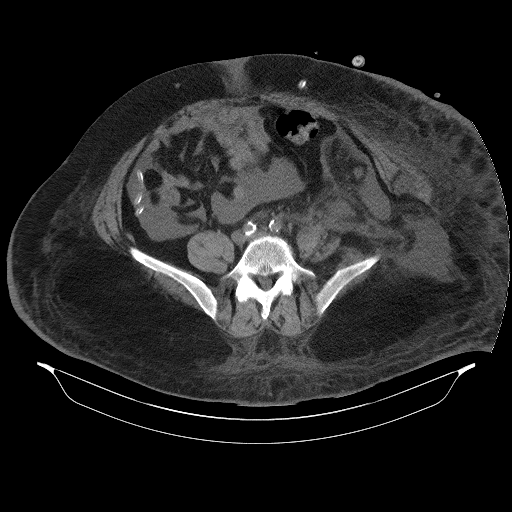
[im 43/48  lung]
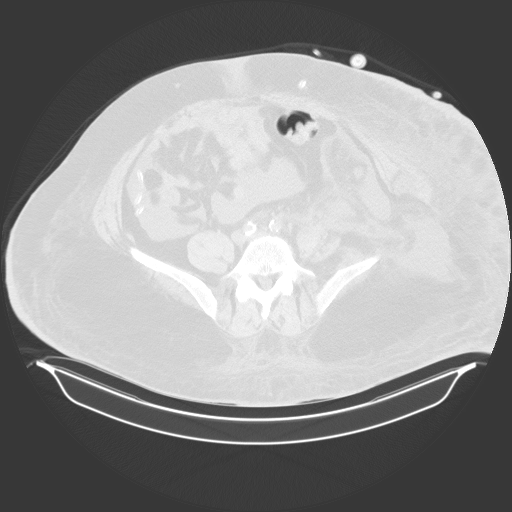

[Series 3: bone (person_name) · axial · 0.98mm/px · z∈[-197,-173]mm · 2 of 80 slices shown]
[im 9/80  bone]
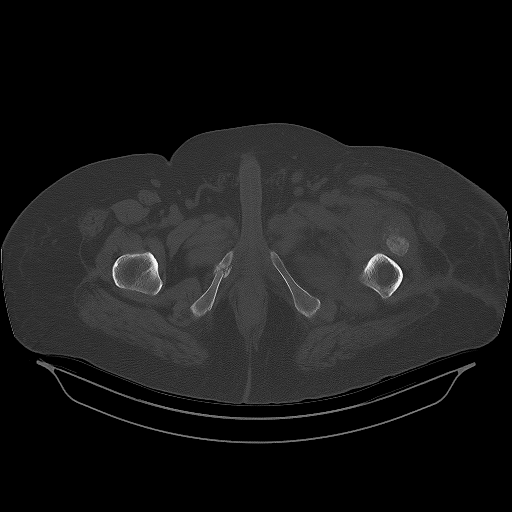
[im 17/80  bone]
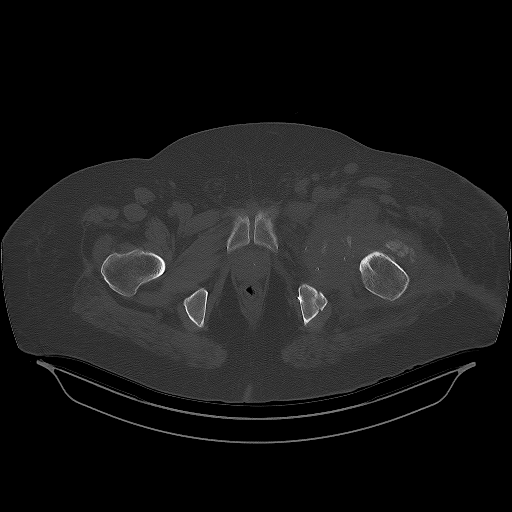

[12 of 32 positions shown; findings below may reference images not displayed]

FINDINGS: Urinary Tract:  Bladder is decompressed.  No urinary tract calculi.

Bowel: No bowel obstruction or ileus. No bowel wall thickening or
inflammatory change.

Vascular/Lymphatic: Atherosclerosis of the distal aorta and
bilateral common iliac arteries. Multiple borderline enlarged lymph
nodes are seen within the left inguinal region and bilateral
external iliac chains, likely reactive. Largest lymph node in the
left inguinal region measures 13 mm in short axis.

Reproductive:  Prostate is not enlarged.

Other: Diffuse free fluid throughout the visualized abdomen and
pelvis. Stable catheter coiled in the right mid abdomen likely
peritoneal dialysis catheter. No free intraperitoneal gas.

Musculoskeletal: Since the prior CT exam, there is fragmentation of
the left femoral head with bony resorption. Extensive heterotopic
ossification. Mild erosive changes of the left greater trochanter.
Extensive erosive changes are seen along the left acetabulum,
greatest in the acetabular roof. Mild wall plate and screw fixation
is seen along the posterior column of the left acetabulum.

There is a large left joint effusion, with extensive soft tissue
edema involving the left hip, proximal left thigh, and extending
along the left iliacus muscle. The soft tissue gas seen on previous
study has resolved in the interim.

There is extensive subcutaneous edema throughout the left lower
quadrant anterior abdominal wall, left lower back, and left gluteal
region. The fluid collection within the subcutaneous fat of the left
lateral hip has resolved in the interim.

There are healing fractures of the right superior and inferior pubic
rami, with moderate callus formation.
IMPRESSION: 1. Fragmentation of the left femoral head since prior exam, with
extensive erosive changes of the left acetabulum, large joint
effusion, and significant surrounding soft tissue edema. Overall,
findings are highly consistent with septic arthritis and progressive
osteomyelitis.
2. Postsurgical changes from prior ORIF of the left acetabulum. The
fracture lines are still apparent, with no significant healing in
the interim.
3. Healing right superior and inferior pubic rami fractures with
moderate callus formation.
4. Large amount of free fluid in the abdomen and pelvis, likely
related to peritoneal dialysis.
5. Reactive lymphadenopathy within the left inguinal region and
bilateral iliac chains.

## 2019-10-17 NOTE — Progress Notes (Signed)
D/W Ainsley Spinner, PA - planning CT scan of the left hip today for evaluation of hardware, bone and previous abscess site.  His inflammatory markers have increased from 6 weeks ago (ESR 63 >> >130; CRP 15 >> 156) and still painful to him with walking short distances.  Will see what the CT scan shows. I am hopeful he would be a candidate for hardware removal - he would probably need re-induction with IV cefazolin after removal of HW prior to consideration of total hip.  Will continue to d/w ortho to see if this is an option

## 2019-10-30 ENCOUNTER — Ambulatory Visit (INDEPENDENT_AMBULATORY_CARE_PROVIDER_SITE_OTHER): Payer: Medicare Other

## 2019-10-30 DIAGNOSIS — I639 Cerebral infarction, unspecified: Secondary | ICD-10-CM | POA: Diagnosis not present

## 2019-10-30 LAB — CUP PACEART REMOTE DEVICE CHECK
Date Time Interrogation Session: 20211003202109
Implantable Pulse Generator Implant Date: 20210429

## 2019-10-31 NOTE — Progress Notes (Signed)
[facility] Summary Report / Loop Recorder 

## 2019-11-02 ENCOUNTER — Ambulatory Visit (INDEPENDENT_AMBULATORY_CARE_PROVIDER_SITE_OTHER): Payer: Medicare Other | Admitting: *Deleted

## 2019-11-02 DIAGNOSIS — Z5181 Encounter for therapeutic drug level monitoring: Secondary | ICD-10-CM | POA: Diagnosis not present

## 2019-11-02 DIAGNOSIS — I6349 Cerebral infarction due to embolism of other cerebral artery: Secondary | ICD-10-CM

## 2019-11-02 LAB — POCT INR: INR: 3 (ref 2.0–3.0)

## 2019-11-02 NOTE — Patient Instructions (Signed)
Continue warfarin 2 tablets daily except 1 1/2 tablets on Sundays and Wednesdays.  Recheck in 4 weeks  Started prozac 10mg  daily 1 wk ago

## 2019-11-06 ENCOUNTER — Other Ambulatory Visit: Payer: Self-pay

## 2019-11-06 DIAGNOSIS — N186 End stage renal disease: Secondary | ICD-10-CM
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
## 2019-11-13 ENCOUNTER — Ambulatory Visit (INDEPENDENT_AMBULATORY_CARE_PROVIDER_SITE_OTHER): Payer: Medicare Other | Admitting: *Deleted

## 2019-11-13 ENCOUNTER — Encounter: Payer: Self-pay | Admitting: Family Medicine

## 2019-11-13 ENCOUNTER — Telehealth: Payer: Medicare Other | Admitting: Cardiovascular Disease

## 2019-11-13 ENCOUNTER — Ambulatory Visit (INDEPENDENT_AMBULATORY_CARE_PROVIDER_SITE_OTHER): Payer: Medicare Other | Admitting: Family Medicine

## 2019-11-13 VITALS — BP 142/82 | HR 76 | Ht 69.0 in | Wt 286.0 lb

## 2019-11-13 DIAGNOSIS — I6349 Cerebral infarction due to embolism of other cerebral artery: Secondary | ICD-10-CM | POA: Diagnosis not present

## 2019-11-13 DIAGNOSIS — Z5181 Encounter for therapeutic drug level monitoring: Secondary | ICD-10-CM | POA: Diagnosis not present

## 2019-11-13 DIAGNOSIS — I2510 Atherosclerotic heart disease of native coronary artery without angina pectoris: Secondary | ICD-10-CM | POA: Diagnosis not present

## 2019-11-13 DIAGNOSIS — I201 Angina pectoris with documented spasm: Secondary | ICD-10-CM

## 2019-11-13 DIAGNOSIS — I10 Essential (primary) hypertension: Secondary | ICD-10-CM

## 2019-11-13 DIAGNOSIS — E782 Mixed hyperlipidemia: Secondary | ICD-10-CM | POA: Diagnosis not present

## 2019-11-13 LAB — POCT INR: INR: 1.6 — AB (ref 2.0–3.0)

## 2019-11-13 NOTE — Patient Instructions (Addendum)
Medication Instructions:  Continue all current medications.   Labwork: none  Testing/Procedures: none  Follow-Up: 6 months   Any Other Special Instructions Will Be Listed Below (If Applicable).   If you need a refill on your cardiac medications before your next appointment, please call your pharmacy.  

## 2019-11-13 NOTE — Patient Instructions (Signed)
Take warfarin 3 tablets today and tomorrow then resume 2 tablets daily except 1 1/2 tablets on Sundays and Wednesdays.  Recheck in 2 weeks  Started prozac 10mg  daily 1 wk ago

## 2019-12-02 LAB — CUP PACEART REMOTE DEVICE CHECK
Date Time Interrogation Session: 20211105202056
Implantable Pulse Generator Implant Date: 20210429

## 2019-12-04 ENCOUNTER — Encounter: Payer: Self-pay | Admitting: Adult Health

## 2019-12-04 ENCOUNTER — Ambulatory Visit (INDEPENDENT_AMBULATORY_CARE_PROVIDER_SITE_OTHER): Payer: Medicare Other | Admitting: Adult Health

## 2019-12-04 ENCOUNTER — Telehealth: Payer: Self-pay | Admitting: Adult Health

## 2019-12-04 ENCOUNTER — Ambulatory Visit (INDEPENDENT_AMBULATORY_CARE_PROVIDER_SITE_OTHER): Payer: Medicare Other

## 2019-12-04 VITALS — BP 124/64 | HR 80 | Ht 69.0 in | Wt 284.2 lb

## 2019-12-04 DIAGNOSIS — I63019 Cerebral infarction due to thrombosis of unspecified vertebral artery: Secondary | ICD-10-CM | POA: Diagnosis not present

## 2019-12-04 DIAGNOSIS — R569 Unspecified convulsions: Secondary | ICD-10-CM

## 2019-12-04 DIAGNOSIS — R29818 Other symptoms and signs involving the nervous system: Secondary | ICD-10-CM

## 2019-12-04 DIAGNOSIS — I639 Cerebral infarction, unspecified: Secondary | ICD-10-CM

## 2019-12-04 MED ORDER — LAMOTRIGINE 100 MG PO TABS: 100.0000 mg | ORAL_TABLET | Freq: Two times a day (BID) | ORAL | 3 refills | Status: DC

## 2019-12-04 MED ORDER — LAMOTRIGINE 25 MG PO TABS: ORAL_TABLET | ORAL | 0 refills | Status: DC

## 2019-12-04 MED ORDER — LEVETIRACETAM 500 MG PO TABS: 500.0000 mg | ORAL_TABLET | Freq: Every day | ORAL | 3 refills | Status: AC
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
## 2019-12-04 NOTE — Telephone Encounter (Signed)
Medicare/BCBS supp order sent to GI. No auth they will reach out to the patient to schedule.

## 2019-12-04 NOTE — Patient Instructions (Addendum)
Obtain MRI brain due to left sided decreased sensation in your arm and leg  Continue aspirin 81 mg daily  and atorvastatin  for secondary stroke prevention  Continue warfarin for L leg DVT monitored by cardiology  Loop recorder has not shown atrial fibrillation thus far - will continue to be monitored by cardiology   Continue keppra 500mg  daily for seizure prophylaxis  Concern for smaller type seizures causing your symptoms of staring off and not responding. - will discuss further with Dr. Leonie Man regarding adding an additional medication for seizure prevention - I will send you a message through your MyChart with further recommendations  Highly recommend undergoing repeat sleep study - if interested in the further, please call office for referral  Continue to follow up with PCP regarding cholesterol and blood pressure management  Maintain strict control of hypertension with blood pressure goal below 130/90, diabetes with hemoglobin A1c goal below 6.5% and cholesterol with LDL cholesterol (bad cholesterol) goal below 70 mg/dL.       Followup in the future with me in 4 months or call earlier if needed       Thank you for coming to see [place] at [facility]. I hope we have been able to provide you high quality care today.  You may receive a patient satisfaction survey over the next few weeks. We would appreciate your feedback and comments so that we may continue to improve ourselves and the health of our patients.   Cognitive Rehabilitation After a Stroke After a stroke, you may have various problems with thinking (cognitive disability). The types of problems you have will depend on how severe the stroke was and where it was located in the brain. Problems may include:  Problems with short-term memory.  Trouble paying attention.  Trouble communicating or understanding language (aphasia).  A drop in mental ability that may interfere with daily life  (dementia).  Trouble with problem-solving and information processing.  Problems with reading, writing, or math.  Problems with your ability to plan and to perform activities in sequence (executive function). These problems can feel overwhelming. However, with rehabilitation and time to heal, many people have improvement in their symptoms. What causes cognitive disability? A stroke happens when blood cannot flow to certain areas of the brain. When this happens, brain cells die in the affected areas because they cannot get oxygen and nutrients from the blood. Cognitive disability is caused by the death of cells in the areas of the brain that control thinking. What is cognitive rehabilitation? Cognitive rehabilitation is a program to help you improve your thinking skills after a stroke. Rehabilitation cannot completely reverse the effects of a stroke, but it can help you with memory, problem-solving, and communication skills. Therapy focuses on:  Improving brain function. This may involve activities such as learning to break down tasks into simple steps.  Helping you learn ways to cope with thinking problems. For example, you might learn memory tricks or do activities that stimulate memory, such as naming objects or describing pictures. Cognitive rehabilitation may include:  Speech-language therapy to help you understand and use language to communicate.  Occupational therapy to help you perform daily activities.  Music therapy to help relieve stress, anxiety, and depression. This may involve listening to music, singing, or playing instruments.  Physical therapy to help improve your ability to move and perform actions that involve the muscles (motor functions). When will therapy start and where will I have therapy? Your health care provider will decide  when it is best for you to start therapy. In some cases, people start rehabilitation as soon as their health is stable, which may be 24-48 hours  after the stroke. Rehabilitation can take place in a few different places, based on your needs. It may take place in:  The hospital or an in-patient rehabilitation hospital.  An outpatient rehabilitation facility.  A long-term care facility.  A community rehabilitation clinic.  Your home. What are some tools to help after a stroke? There are a number of tools and apps that you can use on your smartphone, personal computer, or tablet to help improve brain function. Some of these apps include:  Calendar reminders or alarm apps to help with memory.  Note-taking or sketch pad apps to help with memory or communication.  Text-to-speech apps that allow you to listen to what you are reading, which helps your ability to understanding text.  Picture dictionary or picture message apps to help with communication.  E-readers. These can highlight text as it is read aloud, which helps with listening and reading skills. How can my friends or family help during my rehabilitation? During your recovery, it is important that your friends and family members help you work toward more independence. Your caregivers should speak with your health care providers to learn how they can best help you during recovery. This may include working on speech-language or memory exercises at home, or helping with daily tasks and errands. If you have cognitive disability, you may be at risk for injury or accidents at home, such as forgetting to turn off the stove. Friends and family members can help ensure home safety by taking steps such as getting appliances with automatic shut-off features or storing dangerous objects in a secure place. What else should I know about cognitive rehabilitation after a stroke? Having trouble with memory and problem-solving can make you feel alone. You may also have mood changes, anxiety, or depression after a stroke. It is important to:  Stay connected with others through social groups, online  support groups, or your community.  Talk to your friends, family, and caregivers about any emotional problems you are having.  Go to one-on-one or group therapy as suggested by your health care provider.  Stay physically active and exercise as often as suggested by your health care provider. Summary  After a stroke, some people have problems with thinking that affect attention, memory, language, communication, and problem-solving.  Cognitive rehabilitation is a program to help you regain brain function and learn skills to cope with thinking problems.  Rehabilitation cannot completely reverse the effects of a stroke, but it can help to improve quality of life.  Cognitive rehabilitation may include speech-language therapy, occupational therapy, music therapy, and physical therapy. This information is not intended to replace advice given to you by your health care provider. Make sure you discuss any questions you have with your health care provider. Document Revised: 05/04/2018 Document Reviewed: 04/17/2016 Elsevier Patient Education  [place].

## 2019-12-04 NOTE — Progress Notes (Signed)
[facility] Summary Report / Loop Recorder 

## 2019-12-05 ENCOUNTER — Ambulatory Visit (INDEPENDENT_AMBULATORY_CARE_PROVIDER_SITE_OTHER): Payer: Medicare Other | Admitting: *Deleted

## 2019-12-05 DIAGNOSIS — Z5181 Encounter for therapeutic drug level monitoring: Secondary | ICD-10-CM | POA: Diagnosis not present

## 2019-12-05 DIAGNOSIS — I6349 Cerebral infarction due to embolism of other cerebral artery: Secondary | ICD-10-CM | POA: Diagnosis not present

## 2019-12-05 LAB — POCT INR: INR: 3.5 — AB (ref 2.0–3.0)

## 2019-12-05 NOTE — Patient Instructions (Signed)
Hold warfarin tomorrow then resume 2 tablets daily except 1 1/2 tablets on Sundays and Wednesdays.  Recheck in 3 weeks  Started prozac 10mg  daily 1 wk ago.  Started lamictal. Took warfarin today

## 2019-12-05 NOTE — Progress Notes (Signed)
I agree with the above plan 

## 2019-12-14 ENCOUNTER — Telehealth: Payer: Self-pay | Admitting: *Deleted

## 2019-12-14 MED ORDER — WARFARIN SODIUM 2 MG PO TABS: ORAL_TABLET | ORAL | 4 refills | Status: DC

## 2019-12-14 NOTE — Telephone Encounter (Signed)
Pt's pharmacy only gave him 10 pills for his Rx for his Warfarin the past 2 times he has picked it up.

## 2019-12-14 NOTE — Telephone Encounter (Signed)
New warfarin Rx sent to [facility]

## 2019-12-18 ENCOUNTER — Ambulatory Visit (INDEPENDENT_AMBULATORY_CARE_PROVIDER_SITE_OTHER): Payer: Medicare Other | Admitting: Infectious Diseases

## 2019-12-18 ENCOUNTER — Other Ambulatory Visit: Payer: Self-pay

## 2019-12-18 ENCOUNTER — Encounter: Payer: Self-pay | Admitting: Infectious Diseases

## 2019-12-18 VITALS — BP 144/92 | HR 77 | Temp 97.9°F

## 2019-12-18 DIAGNOSIS — Z28311 Partially vaccinated for covid-19: Secondary | ICD-10-CM | POA: Insufficient documentation

## 2019-12-18 DIAGNOSIS — Z792 Long term (current) use of antibiotics: Secondary | ICD-10-CM | POA: Diagnosis not present

## 2019-12-18 DIAGNOSIS — Z289 Immunization not carried out for unspecified reason: Secondary | ICD-10-CM | POA: Insufficient documentation

## 2019-12-18 DIAGNOSIS — Z5181 Encounter for therapeutic drug level monitoring: Secondary | ICD-10-CM

## 2019-12-18 DIAGNOSIS — T847XXD Infection and inflammatory reaction due to other internal orthopedic prosthetic devices, implants and grafts, subsequent encounter: Secondary | ICD-10-CM | POA: Diagnosis present

## 2019-12-18 MED ORDER — CEPHALEXIN 250 MG PO CAPS: 250.0000 mg | ORAL_CAPSULE | Freq: Two times a day (BID) | ORAL | 3 refills | Status: DC

## 2019-12-18 NOTE — Assessment & Plan Note (Addendum)
D/W Dr. Marcelino Scot the other week and working on more definitive surgical plans for his infection and hopes to preserve joint function in the future. He has a significant amount of erosion to the space. Will continue on suppressive renally dosed cephalexin. Exam and history indicate his condition has not worsened and is at least stable.  We discussed today that he will need to repeat course of IV therapy once we can get a more definitive surgical control over the space. He is OK with this and willing to proceed when indicated. Will need tunneled line again centrally as he does PD in the home.  FU virtually in 3 months.

## 2019-12-18 NOTE — Assessment & Plan Note (Signed)
Screened today for consideration of home-bound initiative of his second dose. Will send referral to scheduler for him to receive dose in the home give challenges getting out of the house with transportation and unstable hip.
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
## 2019-12-18 NOTE — Assessment & Plan Note (Signed)
Will repeat pertinent labs for monitoring of infection and possible s/e to long term antibiotic use. Seems to be tolerating well.

## 2019-12-18 NOTE — Patient Instructions (Addendum)
Please stop by the lab on your way out to make sure that your blood work shows no side effects from long-term antibiotics you are on.   Will continue the antibiotics just like you are currently taking (ONE cephalexin capsule TWICE a day) to attempt to keep this at [place].  You will need more definitive surgery planning to help achieve cure over this. Dr. Marcelino Scot is working on this as you are aware.   Will plan to check in with me again with virtual visit in 3 months.   I will work on getting your second dose of Pfizer vaccine at your house - we have a program here in [facility] that helps do that.

## 2019-12-19 LAB — COMPLETE METABOLIC PANEL WITH GFR
AG Ratio: 0.9 (calc) — ABNORMAL LOW (ref 1.0–2.5)
ALT: 9 U/L (ref 9–46)
AST: 12 U/L (ref 10–40)
Albumin: 3.3 g/dL — ABNORMAL LOW (ref 3.6–5.1)
Alkaline phosphatase (APISO): 59 U/L (ref 36–130)
BUN/Creatinine Ratio: 3 (calc) — ABNORMAL LOW (ref 6–22)
BUN: 25 mg/dL (ref 7–25)
CO2: 32 mmol/L (ref 20–32)
Calcium: 9.2 mg/dL (ref 8.6–10.3)
Chloride: 103 mmol/L (ref 98–110)
Creat: 7.58 mg/dL — ABNORMAL HIGH (ref 0.60–1.35)
GFR, Est African American: 9 mL/min/{1.73_m2} — ABNORMAL LOW (ref >=60)
GFR, Est Non African American: 8 mL/min/{1.73_m2} — ABNORMAL LOW (ref >=60)
Globulin: 3.8 g/dL (calc) — ABNORMAL HIGH (ref 1.9–3.7)
Glucose, Bld: 117 mg/dL — ABNORMAL HIGH (ref 65–99)
Potassium: 3.9 mmol/L (ref 3.5–5.3)
Sodium: 143 mmol/L (ref 135–146)
Total Bilirubin: 0.3 mg/dL (ref 0.2–1.2)
Total Protein: 7.1 g/dL (ref 6.1–8.1)

## 2019-12-19 LAB — CBC
HCT: 33.9 % — ABNORMAL LOW (ref 38.5–50.0)
Hemoglobin: 10.5 g/dL — ABNORMAL LOW (ref 13.2–17.1)
MCH: 26.5 pg — ABNORMAL LOW (ref 27.0–33.0)
MCHC: 31 g/dL — ABNORMAL LOW (ref 32.0–36.0)
MCV: 85.6 fL (ref 80.0–100.0)
MPV: 9.2 fL (ref 7.5–12.5)
Platelets: 276 10*3/uL (ref 140–400)
RBC: 3.96 10*6/uL — ABNORMAL LOW (ref 4.20–5.80)
RDW: 16.7 % — ABNORMAL HIGH (ref 11.0–15.0)
WBC: 6.9 10*3/uL (ref 3.8–10.8)

## 2019-12-19 LAB — SEDIMENTATION RATE: Sed Rate: 62 mm/h — ABNORMAL HIGH (ref 0–15)

## 2019-12-19 LAB — C-REACTIVE PROTEIN: CRP: 6.7 mg/L (ref <8.0)

## 2019-12-19 NOTE — Progress Notes (Signed)
Remains on suppressive cephalexin until follow up plans for orthopedic intervention. Clinically appears to be doing OK/stable; his inflammatory markers have improved markedly. No changes to medical regimen at this time.

## 2019-12-20 ENCOUNTER — Other Ambulatory Visit: Payer: Self-pay

## 2019-12-20 ENCOUNTER — Ambulatory Visit
Admission: RE | Admit: 2019-12-20 | Discharge: 2019-12-20 | Disposition: A | Discharge status: Home / Self Care | Payer: Medicare Other | Source: Ambulatory Visit | Attending: Adult Health | Admitting: Adult Health

## 2019-12-20 DIAGNOSIS — I639 Cerebral infarction, unspecified: Secondary | ICD-10-CM | POA: Diagnosis not present

## 2019-12-20 DIAGNOSIS — R29818 Other symptoms and signs involving the nervous system: Secondary | ICD-10-CM | POA: Diagnosis not present

## 2019-12-26 ENCOUNTER — Ambulatory Visit (INDEPENDENT_AMBULATORY_CARE_PROVIDER_SITE_OTHER): Payer: Medicare Other | Admitting: *Deleted

## 2019-12-26 ENCOUNTER — Encounter: Payer: Self-pay | Admitting: *Deleted

## 2019-12-26 DIAGNOSIS — Z5181 Encounter for therapeutic drug level monitoring: Secondary | ICD-10-CM | POA: Diagnosis not present

## 2019-12-26 DIAGNOSIS — I6349 Cerebral infarction due to embolism of other cerebral artery: Secondary | ICD-10-CM | POA: Diagnosis not present

## 2019-12-26 LAB — POCT INR: INR: 2.2 (ref 2.0–3.0)

## 2019-12-26 NOTE — Patient Instructions (Signed)
Continue warfarin 2 tablets daily except 1 1/2 tablets on Sundays and Wednesdays.  Recheck in 3 weeks  Started prozac 10mg  daily 2 wk ago.  Started lamictal. Took warfarin today

## 2020-01-06 LAB — CUP PACEART REMOTE DEVICE CHECK
Date Time Interrogation Session: 20211208201927
Implantable Pulse Generator Implant Date: 20210429

## 2020-01-08 ENCOUNTER — Ambulatory Visit (INDEPENDENT_AMBULATORY_CARE_PROVIDER_SITE_OTHER): Payer: Medicare Other

## 2020-01-08 DIAGNOSIS — I63019 Cerebral infarction due to thrombosis of unspecified vertebral artery: Secondary | ICD-10-CM

## 2020-01-09 ENCOUNTER — Other Ambulatory Visit: Payer: Self-pay | Admitting: Adult Health

## 2020-01-10 ENCOUNTER — Ambulatory Visit: Payer: Medicare Other | Attending: Internal Medicine

## 2020-01-10 ENCOUNTER — Other Ambulatory Visit: Payer: Self-pay

## 2020-01-10 DIAGNOSIS — Z23 Encounter for immunization: Secondary | ICD-10-CM

## 2020-01-10 NOTE — Progress Notes (Signed)
   [id number]  Name:  Ikaika Showers.    MRN: [id number] DOB: 09/09/1975  01/10/2020  Mr. Counterman was observed post Covid-19 immunization for 15 minutes without incident. He was provided with Vaccine Information Sheet and instruction to access the V-Safe system.   Mr. Shrout was instructed to call [phone] with any severe reactions post vaccine: Marland Kitchen Difficulty breathing  . Swelling of face and throat  . A fast heartbeat  . A bad rash all over body  . Dizziness and weakness   Immunizations Administered    Name Date Dose VIS Date Route   Pfizer COVID-19 Vaccine 01/10/2020 10:20 AM 0.3 mL 11/15/2019 Intramuscular   Manufacturer: [employer]   Lot: [id number]   NDC: [address]-0

## 2020-01-12 ENCOUNTER — Ambulatory Visit: Payer: Medicare Other

## 2020-01-17 ENCOUNTER — Ambulatory Visit (INDEPENDENT_AMBULATORY_CARE_PROVIDER_SITE_OTHER): Payer: Medicare Other | Admitting: *Deleted

## 2020-01-17 DIAGNOSIS — I6349 Cerebral infarction due to embolism of other cerebral artery: Secondary | ICD-10-CM | POA: Diagnosis not present

## 2020-01-17 DIAGNOSIS — Z5181 Encounter for therapeutic drug level monitoring: Secondary | ICD-10-CM

## 2020-01-17 LAB — POCT INR: INR: 1.9 — AB (ref 2.0–3.0)

## 2020-01-17 NOTE — Patient Instructions (Signed)
Increase warfarin to 2 tablets daily except 1 1/2 tablets on Sundays  Recheck in 3 weeks

## 2020-01-23 NOTE — Progress Notes (Signed)
[facility] Summary Report / Loop Recorder 

## 2020-02-07 ENCOUNTER — Ambulatory Visit (INDEPENDENT_AMBULATORY_CARE_PROVIDER_SITE_OTHER): Payer: Medicare Other | Admitting: *Deleted

## 2020-02-07 DIAGNOSIS — Z5181 Encounter for therapeutic drug level monitoring: Secondary | ICD-10-CM

## 2020-02-07 DIAGNOSIS — I6349 Cerebral infarction due to embolism of other cerebral artery: Secondary | ICD-10-CM

## 2020-02-07 LAB — POCT INR: INR: 2.3 (ref 2.0–3.0)

## 2020-02-07 NOTE — Patient Instructions (Signed)
Continue warfarin 2 tablets daily except 1 1/2 tablets on Sundays  Recheck in 4 weeks

## 2020-02-12 ENCOUNTER — Ambulatory Visit (INDEPENDENT_AMBULATORY_CARE_PROVIDER_SITE_OTHER): Payer: Medicare Other

## 2020-02-12 DIAGNOSIS — I63019 Cerebral infarction due to thrombosis of unspecified vertebral artery: Secondary | ICD-10-CM | POA: Diagnosis not present

## 2020-02-13 LAB — CUP PACEART REMOTE DEVICE CHECK
Date Time Interrogation Session: 20220110202046
Implantable Pulse Generator Implant Date: 20210429

## 2020-02-26 NOTE — Progress Notes (Signed)
[facility] Summary Report / Loop Recorder 

## 2020-03-06 ENCOUNTER — Ambulatory Visit (INDEPENDENT_AMBULATORY_CARE_PROVIDER_SITE_OTHER): Payer: Medicare Other | Admitting: *Deleted

## 2020-03-06 DIAGNOSIS — Z5181 Encounter for therapeutic drug level monitoring: Secondary | ICD-10-CM | POA: Diagnosis not present

## 2020-03-06 DIAGNOSIS — I6349 Cerebral infarction due to embolism of other cerebral artery: Secondary | ICD-10-CM

## 2020-03-06 LAB — POCT INR: INR: 3.7 — AB (ref 2.0–3.0)

## 2020-03-06 NOTE — Patient Instructions (Signed)
Hold warfarin tomorrow, take 1 tablet on Fridays resume 2 tablets daily except 1 1/2 tablets on Sundays  Recheck in 3 weeks

## 2020-03-07 ENCOUNTER — Inpatient Hospital Stay (HOSPITAL_COMMUNITY)
Admission: EM | Admit: 2020-03-07 | Discharge: 2020-03-26 | DRG: 981 | Disposition: A | Discharge status: Rehabilitation Facility | Payer: Medicare Other | Attending: Internal Medicine | Admitting: Internal Medicine

## 2020-03-07 ENCOUNTER — Inpatient Hospital Stay (HOSPITAL_COMMUNITY): Payer: Medicare Other

## 2020-03-07 ENCOUNTER — Encounter (HOSPITAL_COMMUNITY): Payer: Self-pay

## 2020-03-07 ENCOUNTER — Emergency Department (HOSPITAL_COMMUNITY): Payer: Medicare Other

## 2020-03-07 ENCOUNTER — Other Ambulatory Visit: Payer: Self-pay

## 2020-03-07 DIAGNOSIS — Z86718 Personal history of other venous thrombosis and embolism: Secondary | ICD-10-CM

## 2020-03-07 DIAGNOSIS — E875 Hyperkalemia: Secondary | ICD-10-CM | POA: Diagnosis present

## 2020-03-07 DIAGNOSIS — D62 Acute posthemorrhagic anemia: Secondary | ICD-10-CM | POA: Diagnosis present

## 2020-03-07 DIAGNOSIS — R8281 Pyuria: Secondary | ICD-10-CM | POA: Diagnosis present

## 2020-03-07 DIAGNOSIS — I469 Cardiac arrest, cause unspecified: Secondary | ICD-10-CM | POA: Diagnosis present

## 2020-03-07 DIAGNOSIS — N186 End stage renal disease: Secondary | ICD-10-CM | POA: Diagnosis not present

## 2020-03-07 DIAGNOSIS — S32402K Unspecified fracture of left acetabulum, subsequent encounter for fracture with nonunion: Secondary | ICD-10-CM

## 2020-03-07 DIAGNOSIS — R569 Unspecified convulsions: Secondary | ICD-10-CM | POA: Diagnosis not present

## 2020-03-07 DIAGNOSIS — M898X9 Other specified disorders of bone, unspecified site: Secondary | ICD-10-CM | POA: Diagnosis present

## 2020-03-07 DIAGNOSIS — Z8673 Personal history of transient ischemic attack (TIA), and cerebral infarction without residual deficits: Secondary | ICD-10-CM

## 2020-03-07 DIAGNOSIS — E1122 Type 2 diabetes mellitus with diabetic chronic kidney disease: Secondary | ICD-10-CM | POA: Diagnosis present

## 2020-03-07 DIAGNOSIS — F32A Depression, unspecified: Secondary | ICD-10-CM | POA: Diagnosis present

## 2020-03-07 DIAGNOSIS — J811 Chronic pulmonary edema: Secondary | ICD-10-CM

## 2020-03-07 DIAGNOSIS — G931 Anoxic brain damage, not elsewhere classified: Secondary | ICD-10-CM | POA: Diagnosis present

## 2020-03-07 DIAGNOSIS — J9601 Acute respiratory failure with hypoxia: Secondary | ICD-10-CM | POA: Diagnosis present

## 2020-03-07 DIAGNOSIS — J9690 Respiratory failure, unspecified, unspecified whether with hypoxia or hypercapnia: Secondary | ICD-10-CM

## 2020-03-07 DIAGNOSIS — S7011XS Contusion of right thigh, sequela: Secondary | ICD-10-CM | POA: Diagnosis not present

## 2020-03-07 DIAGNOSIS — Z86711 Personal history of pulmonary embolism: Secondary | ICD-10-CM

## 2020-03-07 DIAGNOSIS — E785 Hyperlipidemia, unspecified: Secondary | ICD-10-CM | POA: Diagnosis present

## 2020-03-07 DIAGNOSIS — D649 Anemia, unspecified: Secondary | ICD-10-CM | POA: Diagnosis not present

## 2020-03-07 DIAGNOSIS — Z7901 Long term (current) use of anticoagulants: Secondary | ICD-10-CM

## 2020-03-07 DIAGNOSIS — R9431 Abnormal electrocardiogram [ECG] [EKG]: Secondary | ICD-10-CM

## 2020-03-07 DIAGNOSIS — Y831 Surgical operation with implant of artificial internal device as the cause of abnormal reaction of the patient, or of later complication, without mention of misadventure at the time of the procedure: Secondary | ICD-10-CM | POA: Diagnosis present

## 2020-03-07 DIAGNOSIS — E8770 Fluid overload, unspecified: Secondary | ICD-10-CM | POA: Diagnosis present

## 2020-03-07 DIAGNOSIS — Z452 Encounter for adjustment and management of vascular access device: Secondary | ICD-10-CM

## 2020-03-07 DIAGNOSIS — N189 Chronic kidney disease, unspecified: Secondary | ICD-10-CM | POA: Diagnosis not present

## 2020-03-07 DIAGNOSIS — G9341 Metabolic encephalopathy: Secondary | ICD-10-CM | POA: Diagnosis present

## 2020-03-07 DIAGNOSIS — G9340 Encephalopathy, unspecified: Secondary | ICD-10-CM | POA: Diagnosis not present

## 2020-03-07 DIAGNOSIS — Z83438 Family history of other disorder of lipoprotein metabolism and other lipidemia: Secondary | ICD-10-CM

## 2020-03-07 DIAGNOSIS — G4733 Obstructive sleep apnea (adult) (pediatric): Secondary | ICD-10-CM | POA: Diagnosis not present

## 2020-03-07 DIAGNOSIS — I2510 Atherosclerotic heart disease of native coronary artery without angina pectoris: Secondary | ICD-10-CM | POA: Diagnosis present

## 2020-03-07 DIAGNOSIS — E876 Hypokalemia: Secondary | ICD-10-CM | POA: Diagnosis present

## 2020-03-07 DIAGNOSIS — I120 Hypertensive chronic kidney disease with stage 5 chronic kidney disease or end stage renal disease: Secondary | ICD-10-CM | POA: Diagnosis not present

## 2020-03-07 DIAGNOSIS — M7981 Nontraumatic hematoma of soft tissue: Secondary | ICD-10-CM | POA: Diagnosis present

## 2020-03-07 DIAGNOSIS — I10 Essential (primary) hypertension: Secondary | ICD-10-CM | POA: Diagnosis not present

## 2020-03-07 DIAGNOSIS — D631 Anemia in chronic kidney disease: Secondary | ICD-10-CM | POA: Diagnosis present

## 2020-03-07 DIAGNOSIS — Z792 Long term (current) use of antibiotics: Secondary | ICD-10-CM

## 2020-03-07 DIAGNOSIS — I468 Cardiac arrest due to other underlying condition: Secondary | ICD-10-CM | POA: Diagnosis present

## 2020-03-07 DIAGNOSIS — Z9119 Patient's noncompliance with other medical treatment and regimen: Secondary | ICD-10-CM

## 2020-03-07 DIAGNOSIS — D72829 Elevated white blood cell count, unspecified: Secondary | ICD-10-CM | POA: Diagnosis not present

## 2020-03-07 DIAGNOSIS — Z6839 Body mass index (BMI) 39.0-39.9, adult: Secondary | ICD-10-CM | POA: Diagnosis not present

## 2020-03-07 DIAGNOSIS — G629 Polyneuropathy, unspecified: Secondary | ICD-10-CM | POA: Diagnosis present

## 2020-03-07 DIAGNOSIS — Z833 Family history of diabetes mellitus: Secondary | ICD-10-CM

## 2020-03-07 DIAGNOSIS — R1312 Dysphagia, oropharyngeal phase: Secondary | ICD-10-CM | POA: Diagnosis present

## 2020-03-07 DIAGNOSIS — Z79899 Other long term (current) drug therapy: Secondary | ICD-10-CM

## 2020-03-07 DIAGNOSIS — Z885 Allergy status to narcotic agent status: Secondary | ICD-10-CM

## 2020-03-07 DIAGNOSIS — Z8619 Personal history of other infectious and parasitic diseases: Secondary | ICD-10-CM

## 2020-03-07 DIAGNOSIS — Z952 Presence of prosthetic heart valve: Secondary | ICD-10-CM

## 2020-03-07 DIAGNOSIS — D696 Thrombocytopenia, unspecified: Secondary | ICD-10-CM | POA: Diagnosis present

## 2020-03-07 DIAGNOSIS — R9401 Abnormal electroencephalogram [EEG]: Secondary | ICD-10-CM

## 2020-03-07 DIAGNOSIS — Z7982 Long term (current) use of aspirin: Secondary | ICD-10-CM

## 2020-03-07 DIAGNOSIS — Z8249 Family history of ischemic heart disease and other diseases of the circulatory system: Secondary | ICD-10-CM

## 2020-03-07 DIAGNOSIS — I252 Old myocardial infarction: Secondary | ICD-10-CM

## 2020-03-07 DIAGNOSIS — T8452XD Infection and inflammatory reaction due to internal left hip prosthesis, subsequent encounter: Secondary | ICD-10-CM | POA: Diagnosis not present

## 2020-03-07 DIAGNOSIS — R9389 Abnormal findings on diagnostic imaging of other specified body structures: Secondary | ICD-10-CM | POA: Diagnosis not present

## 2020-03-07 DIAGNOSIS — R000 Tachycardia, unspecified: Secondary | ICD-10-CM | POA: Diagnosis not present

## 2020-03-07 DIAGNOSIS — Z8674 Personal history of sudden cardiac arrest: Secondary | ICD-10-CM | POA: Diagnosis not present

## 2020-03-07 DIAGNOSIS — Z87891 Personal history of nicotine dependence: Secondary | ICD-10-CM

## 2020-03-07 DIAGNOSIS — M89552 Osteolysis, left thigh: Secondary | ICD-10-CM | POA: Diagnosis not present

## 2020-03-07 DIAGNOSIS — Z6841 Body Mass Index (BMI) 40.0 and over, adult: Secondary | ICD-10-CM

## 2020-03-07 DIAGNOSIS — J810 Acute pulmonary edema: Secondary | ICD-10-CM

## 2020-03-07 DIAGNOSIS — Z9911 Dependence on respirator [ventilator] status: Secondary | ICD-10-CM

## 2020-03-07 DIAGNOSIS — R069 Unspecified abnormalities of breathing: Secondary | ICD-10-CM

## 2020-03-07 DIAGNOSIS — J9600 Acute respiratory failure, unspecified whether with hypoxia or hypercapnia: Secondary | ICD-10-CM

## 2020-03-07 DIAGNOSIS — E11649 Type 2 diabetes mellitus with hypoglycemia without coma: Secondary | ICD-10-CM | POA: Diagnosis not present

## 2020-03-07 DIAGNOSIS — E872 Acidosis: Secondary | ICD-10-CM | POA: Diagnosis present

## 2020-03-07 DIAGNOSIS — Z992 Dependence on renal dialysis: Secondary | ICD-10-CM | POA: Diagnosis not present

## 2020-03-07 DIAGNOSIS — E1165 Type 2 diabetes mellitus with hyperglycemia: Secondary | ICD-10-CM | POA: Diagnosis present

## 2020-03-07 DIAGNOSIS — Z20822 Contact with and (suspected) exposure to covid-19: Secondary | ICD-10-CM | POA: Diagnosis present

## 2020-03-07 DIAGNOSIS — Z881 Allergy status to other antibiotic agents status: Secondary | ICD-10-CM

## 2020-03-07 DIAGNOSIS — R42 Dizziness and giddiness: Secondary | ICD-10-CM | POA: Diagnosis present

## 2020-03-07 DIAGNOSIS — E1142 Type 2 diabetes mellitus with diabetic polyneuropathy: Secondary | ICD-10-CM | POA: Diagnosis not present

## 2020-03-07 DIAGNOSIS — T82868A Thrombosis of vascular prosthetic devices, implants and grafts, initial encounter: Secondary | ICD-10-CM | POA: Diagnosis not present

## 2020-03-07 DIAGNOSIS — Z91040 Latex allergy status: Secondary | ICD-10-CM

## 2020-03-07 DIAGNOSIS — G40909 Epilepsy, unspecified, not intractable, without status epilepticus: Secondary | ICD-10-CM | POA: Diagnosis not present

## 2020-03-07 DIAGNOSIS — R5381 Other malaise: Secondary | ICD-10-CM | POA: Diagnosis not present

## 2020-03-07 DIAGNOSIS — Z781 Physical restraint status: Secondary | ICD-10-CM

## 2020-03-07 DIAGNOSIS — G8929 Other chronic pain: Secondary | ICD-10-CM | POA: Diagnosis present

## 2020-03-07 DIAGNOSIS — M25369 Other instability, unspecified knee: Secondary | ICD-10-CM | POA: Diagnosis present

## 2020-03-07 LAB — I-STAT ARTERIAL BLOOD GAS, ED
Acid-Base Excess: 2 mmol/L (ref 0.0–2.0)
Bicarbonate: 28.7 mmol/L — ABNORMAL HIGH (ref 20.0–28.0)
Calcium, Ion: 1.18 mmol/L (ref 1.15–1.40)
HCT: 25 % — ABNORMAL LOW (ref 39.0–52.0)
Hemoglobin: 8.5 g/dL — ABNORMAL LOW (ref 13.0–17.0)
O2 Saturation: 94 %
Patient temperature: 96.5
Potassium: 2.8 mmol/L — ABNORMAL LOW (ref 3.5–5.1)
Sodium: 142 mmol/L (ref 135–145)
TCO2: 30 mmol/L (ref 22–32)
pCO2 arterial: 52.8 mmHg — ABNORMAL HIGH (ref 32.0–48.0)
pH, Arterial: 7.338 — ABNORMAL LOW (ref 7.350–7.450)
pO2, Arterial: 72 mmHg — ABNORMAL LOW (ref 83.0–108.0)

## 2020-03-07 LAB — BASIC METABOLIC PANEL
Anion gap: 17 — ABNORMAL HIGH (ref 5–15)
Anion gap: 10 (ref 5–15)
BUN: 14 mg/dL (ref 6–20)
BUN: 15 mg/dL (ref 6–20)
CO2: 22 mmol/L (ref 22–32)
CO2: 27 mmol/L (ref 22–32)
Calcium: 8.4 mg/dL — ABNORMAL LOW (ref 8.9–10.3)
Calcium: 8 mg/dL — ABNORMAL LOW (ref 8.9–10.3)
Chloride: 98 mmol/L (ref 98–111)
Chloride: 102 mmol/L (ref 98–111)
Creatinine, Ser: 7.76 mg/dL — ABNORMAL HIGH (ref 0.61–1.24)
Creatinine, Ser: 7.75 mg/dL — ABNORMAL HIGH (ref 0.61–1.24)
GFR, Estimated: 8 mL/min — ABNORMAL LOW (ref >60)
GFR, Estimated: 8 mL/min — ABNORMAL LOW (ref >60)
Glucose, Bld: 253 mg/dL — ABNORMAL HIGH (ref 70–99)
Glucose, Bld: 98 mg/dL (ref 70–99)
Potassium: 2.8 mmol/L — ABNORMAL LOW (ref 3.5–5.1)
Potassium: 3.5 mmol/L (ref 3.5–5.1)
Sodium: 137 mmol/L (ref 135–145)
Sodium: 139 mmol/L (ref 135–145)

## 2020-03-07 LAB — RENAL FUNCTION PANEL
Albumin: 1.8 g/dL — ABNORMAL LOW (ref 3.5–5.0)
Anion gap: 10 (ref 5–15)
BUN: 17 mg/dL (ref 6–20)
CO2: 27 mmol/L (ref 22–32)
Calcium: 7.7 mg/dL — ABNORMAL LOW (ref 8.9–10.3)
Chloride: 103 mmol/L (ref 98–111)
Creatinine, Ser: 7.8 mg/dL — ABNORMAL HIGH (ref 0.61–1.24)
GFR, Estimated: 8 mL/min — ABNORMAL LOW (ref >60)
Glucose, Bld: 111 mg/dL — ABNORMAL HIGH (ref 70–99)
Phosphorus: 4.3 mg/dL (ref 2.5–4.6)
Potassium: 3.3 mmol/L — ABNORMAL LOW (ref 3.5–5.1)
Sodium: 140 mmol/L (ref 135–145)

## 2020-03-07 LAB — CBC WITH DIFFERENTIAL/PLATELET
Abs Immature Granulocytes: 0.14 10*3/uL — ABNORMAL HIGH (ref 0.00–0.07)
Basophils Absolute: 0.1 10*3/uL (ref 0.0–0.1)
Basophils Relative: 1 %
Eosinophils Absolute: 0.3 10*3/uL (ref 0.0–0.5)
Eosinophils Relative: 3 %
HCT: 29.9 % — ABNORMAL LOW (ref 39.0–52.0)
Hemoglobin: 9.2 g/dL — ABNORMAL LOW (ref 13.0–17.0)
Immature Granulocytes: 1 %
Lymphocytes Relative: 20 %
Lymphs Abs: 2 10*3/uL (ref 0.7–4.0)
MCH: 27.3 pg (ref 26.0–34.0)
MCHC: 30.8 g/dL (ref 30.0–36.0)
MCV: 88.7 fL (ref 80.0–100.0)
Monocytes Absolute: 0.7 10*3/uL (ref 0.1–1.0)
Monocytes Relative: 7 %
Neutro Abs: 6.8 10*3/uL (ref 1.7–7.7)
Neutrophils Relative %: 68 %
Platelets: 209 10*3/uL (ref 150–400)
RBC: 3.37 MIL/uL — ABNORMAL LOW (ref 4.22–5.81)
RDW: 15.7 % — ABNORMAL HIGH (ref 11.5–15.5)
WBC: 10 10*3/uL (ref 4.0–10.5)
nRBC: 0.5 % — ABNORMAL HIGH (ref 0.0–0.2)

## 2020-03-07 LAB — SARS CORONAVIRUS 2 (TAT 6-24 HRS): SARS Coronavirus 2: NEGATIVE

## 2020-03-07 LAB — RAPID URINE DRUG SCREEN, HOSP PERFORMED
Amphetamines: NOT DETECTED
Barbiturates: NOT DETECTED
Benzodiazepines: NOT DETECTED
Cocaine: NOT DETECTED
Opiates: NOT DETECTED
Tetrahydrocannabinol: NOT DETECTED

## 2020-03-07 LAB — POCT I-STAT 7, (LYTES, BLD GAS, ICA,H+H)
Acid-Base Excess: 0 mmol/L (ref 0.0–2.0)
Bicarbonate: 27.8 mmol/L (ref 20.0–28.0)
Calcium, Ion: 1.18 mmol/L (ref 1.15–1.40)
HCT: 47 % (ref 39.0–52.0)
Hemoglobin: 16 g/dL (ref 13.0–17.0)
O2 Saturation: 88 %
Patient temperature: 97.5
Potassium: 3 mmol/L — ABNORMAL LOW (ref 3.5–5.1)
Sodium: 142 mmol/L (ref 135–145)
TCO2: 30 mmol/L (ref 22–32)
pCO2 arterial: 53.9 mmHg — ABNORMAL HIGH (ref 32.0–48.0)
pH, Arterial: 7.318 — ABNORMAL LOW (ref 7.350–7.450)
pO2, Arterial: 59 mmHg — ABNORMAL LOW (ref 83.0–108.0)

## 2020-03-07 LAB — PROTIME-INR
INR: 3.8 — ABNORMAL HIGH (ref 0.8–1.2)
Prothrombin Time: 36.5 seconds — ABNORMAL HIGH (ref 11.4–15.2)

## 2020-03-07 LAB — URINALYSIS, ROUTINE W REFLEX MICROSCOPIC
Bilirubin Urine: NEGATIVE
Glucose, UA: >=500 mg/dL — AB
Ketones, ur: NEGATIVE mg/dL
Leukocytes,Ua: NEGATIVE
Nitrite: NEGATIVE
Protein, ur: >300 mg/dL — AB
Specific Gravity, Urine: 1.015 (ref 1.005–1.030)
pH: 8.5 — ABNORMAL HIGH (ref 5.0–8.0)

## 2020-03-07 LAB — PHOSPHORUS: Phosphorus: 4.7 mg/dL — ABNORMAL HIGH (ref 2.5–4.6)

## 2020-03-07 LAB — GLUCOSE, CAPILLARY
Glucose-Capillary: 101 mg/dL — ABNORMAL HIGH (ref 70–99)
Glucose-Capillary: 151 mg/dL — ABNORMAL HIGH (ref 70–99)
Glucose-Capillary: 81 mg/dL (ref 70–99)
Glucose-Capillary: 81 mg/dL (ref 70–99)
Glucose-Capillary: 83 mg/dL (ref 70–99)

## 2020-03-07 LAB — CBG MONITORING, ED: Glucose-Capillary: 238 mg/dL — ABNORMAL HIGH (ref 70–99)

## 2020-03-07 LAB — ECHOCARDIOGRAM COMPLETE
AR max vel: 2.87 cm2
AV Area VTI: 3.05 cm2
AV Area mean vel: 2.81 cm2
AV Mean grad: 3 mmHg
AV Peak grad: 5.3 mmHg
Ao pk vel: 1.15 m/s
Area-P 1/2: 2.87 cm2
Height: 69 in
MV VTI: 2.1 cm2
S' Lateral: 3.3 cm
Weight: 5573.23 oz

## 2020-03-07 LAB — URINALYSIS, MICROSCOPIC (REFLEX): Bacteria, UA: NONE SEEN

## 2020-03-07 LAB — MAGNESIUM
Magnesium: 1.4 mg/dL — ABNORMAL LOW (ref 1.7–2.4)
Magnesium: 2.3 mg/dL (ref 1.7–2.4)
Magnesium: 1.8 mg/dL (ref 1.7–2.4)

## 2020-03-07 LAB — APTT: aPTT: 65 seconds — ABNORMAL HIGH (ref 24–36)

## 2020-03-07 LAB — HEMOGLOBIN A1C
Hgb A1c MFr Bld: 5.6 % (ref 4.8–5.6)
Mean Plasma Glucose: 114.02 mg/dL

## 2020-03-07 LAB — TROPONIN I (HIGH SENSITIVITY)
Troponin I (High Sensitivity): 13 ng/L (ref <18)
Troponin I (High Sensitivity): 19 ng/L — ABNORMAL HIGH (ref <18)

## 2020-03-07 LAB — MRSA PCR SCREENING: MRSA by PCR: NEGATIVE

## 2020-03-07 IMAGING — DX DG CHEST 1V PORT
1 series · 1 of 1 positions shown · non-contrast
Comparison: [DATE]

CLINICAL DATA: Intubation

EXAM:
PORTABLE CHEST 1 VIEW

[chest ap]
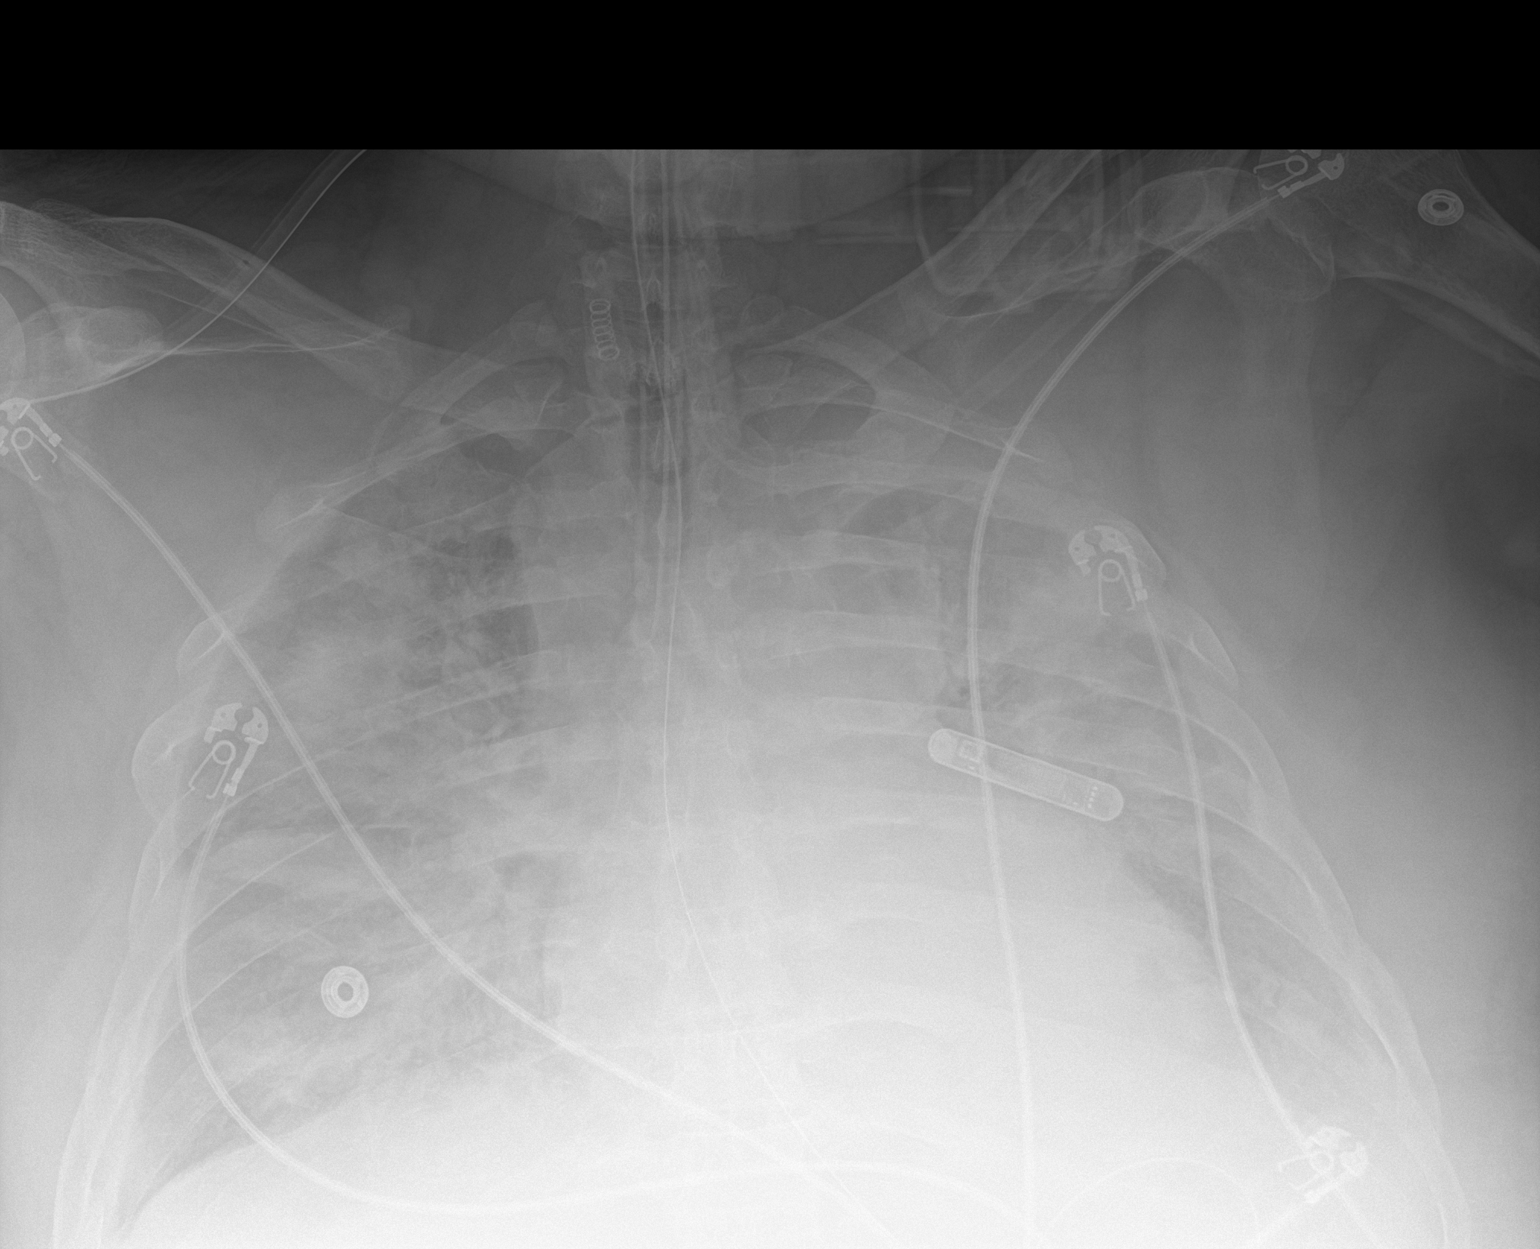

[1 of 1 positions shown; findings below may reference images not displayed]

FINDINGS: Endotracheal tube is just above the carina by 7 mm. This could be
retracted slightly for optimal positioning. NG tube is in the
stomach. Severe diffuse bilateral airspace disease. Heart is
borderline in size. Suspect small effusions.
IMPRESSION: Endotracheal tube is just above the level of the carina. This could
be retracted 2-3 cm for optimal positioning. NG tube in the stomach.

Severe diffuse bilateral airspace disease could reflect edema or
infection.

## 2020-03-07 IMAGING — DX DG CHEST 1V PORT
1 series · 1 of 1 positions shown · non-contrast
Comparison: Film from earlier in the same day.

CLINICAL DATA: Check central line placement

EXAM:
PORTABLE CHEST 1 VIEW

[chest ap]
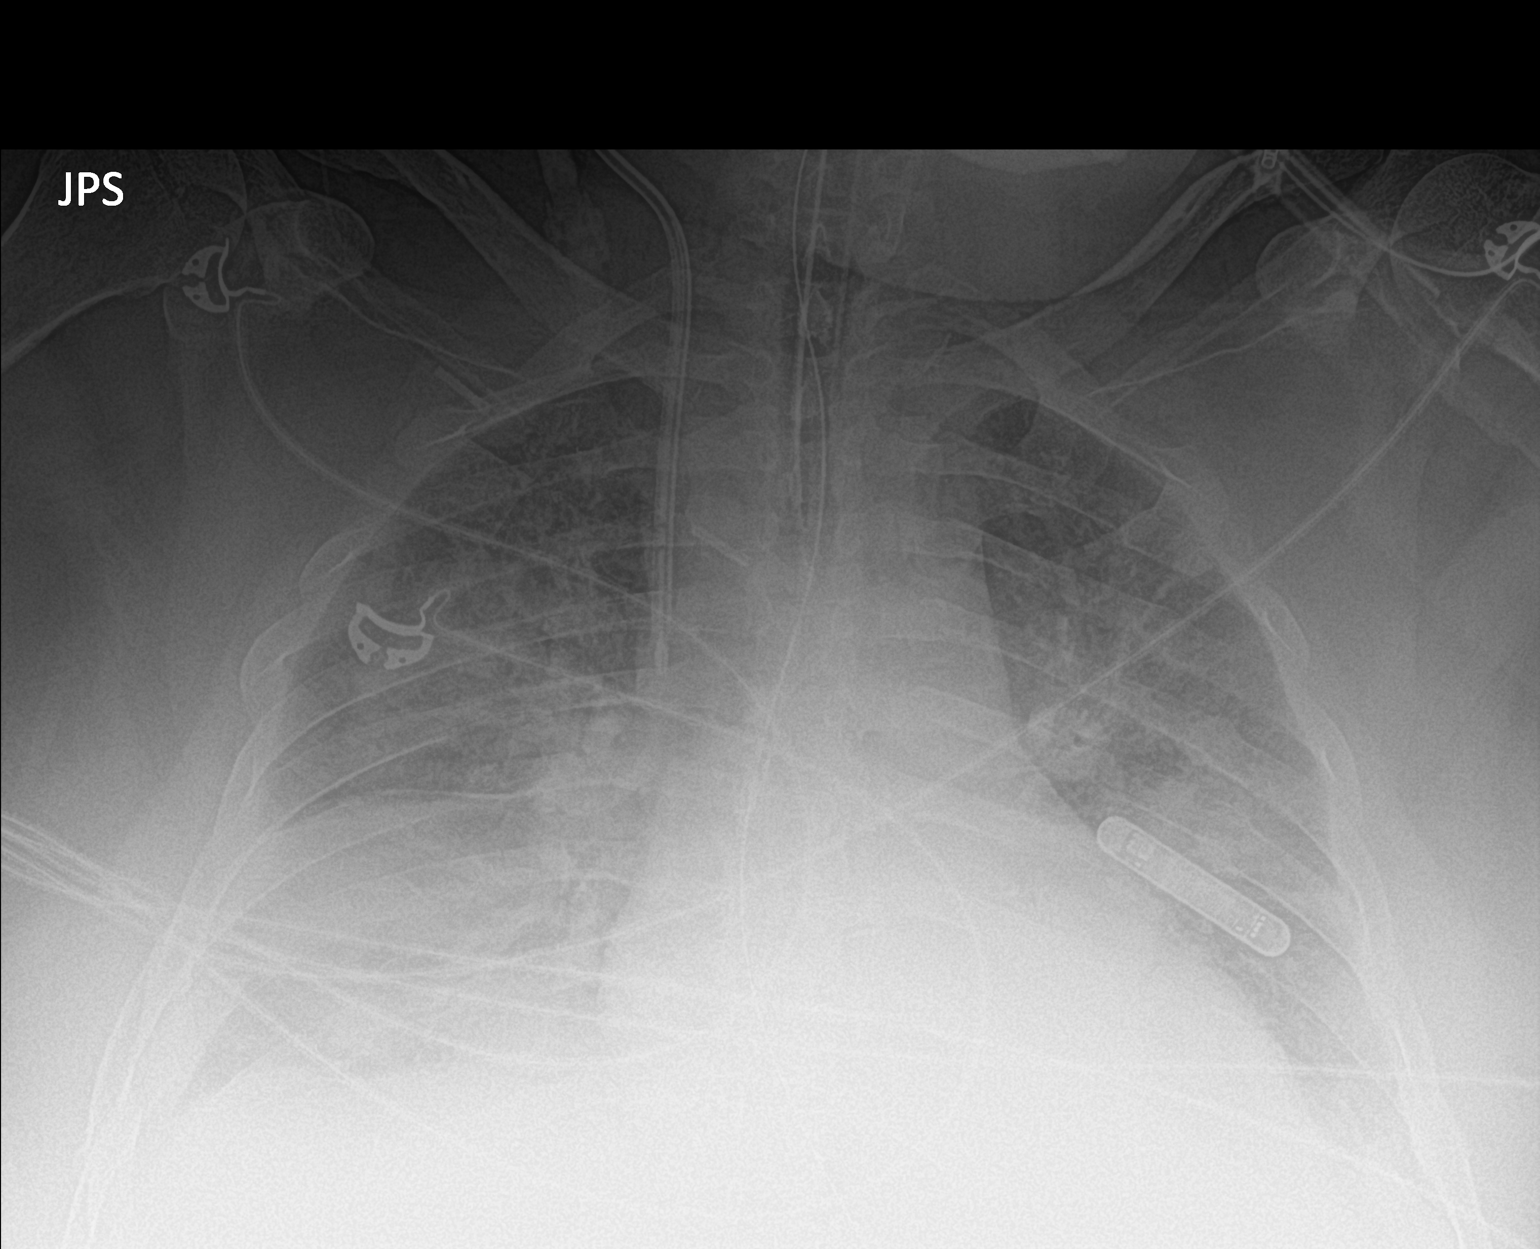

[1 of 1 positions shown; findings below may reference images not displayed]

FINDINGS: Cardiac shadow is stable. Loop recorder is again noted. Endotracheal
tube and gastric catheter are seen. New right jugular central line
is noted for dialysis therapy. No pneumothorax is noted. Patchy
airspace opacities are noted slightly improved when compared with
the prior exam.
IMPRESSION: No pneumothorax following central line placement.

Slight improved aeration in the lungs bilaterally.

## 2020-03-07 MED ORDER — PANTOPRAZOLE SODIUM 40 MG IV SOLR
40.0000 mg | Freq: Every day | INTRAVENOUS | Status: DC
  Administered 2020-03-07 – 2020-03-13 (×7): 40 mg via INTRAVENOUS
  Filled 2020-03-07 (×7): qty 40

## 2020-03-07 MED ORDER — SODIUM CHLORIDE 0.9% FLUSH
10.0000 mL | Freq: Two times a day (BID) | INTRAVENOUS | Status: DC
  Administered 2020-03-07 – 2020-03-25 (×19): 10 mL

## 2020-03-07 MED ORDER — MAGNESIUM SULFATE 2 GM/50ML IV SOLN: 2.0000 g | Freq: Once | INTRAVENOUS | Status: DC

## 2020-03-07 MED ORDER — DOCUSATE SODIUM 50 MG/5ML PO LIQD
100.0000 mg | Freq: Two times a day (BID) | ORAL | Status: DC
  Administered 2020-03-07 – 2020-03-10 (×7): 100 mg
  Filled 2020-03-07 (×9): qty 10

## 2020-03-07 MED ORDER — VITAL HIGH PROTEIN PO LIQD
1000.0000 mL | ORAL | Status: DC
Start: 2020-03-07 — End: 2020-03-11
  Administered 2020-03-07 – 2020-03-10 (×6): 1000 mL

## 2020-03-07 MED ORDER — PROSOURCE TF PO LIQD
45.0000 mL | Freq: Two times a day (BID) | ORAL | Status: DC
  Administered 2020-03-07 – 2020-03-10 (×7): 45 mL
  Filled 2020-03-07 (×7): qty 45

## 2020-03-07 MED ORDER — AMLODIPINE BESYLATE 5 MG PO TABS
5.0000 mg | ORAL_TABLET | Freq: Every day | ORAL | Status: DC
  Administered 2020-03-07: 5 mg
  Filled 2020-03-07 (×2): qty 1

## 2020-03-07 MED ORDER — SUCCINYLCHOLINE CHLORIDE 20 MG/ML IJ SOLN
INTRAMUSCULAR | Status: AC | PRN
  Administered 2020-03-07: 150 mg via INTRAVENOUS

## 2020-03-07 MED ORDER — SODIUM CHLORIDE 0.9 % IV SOLN: INTRAVENOUS | Status: DC

## 2020-03-07 MED ORDER — PRISMASOL BGK 4/2.5 32-4-2.5 MEQ/L REPLACEMENT SOLN: Status: DC

## 2020-03-07 MED ORDER — POLYETHYLENE GLYCOL 3350 17 G PO PACK
17.0000 g | PACK | Freq: Every day | ORAL | Status: DC
  Administered 2020-03-07 – 2020-03-10 (×4): 17 g
  Filled 2020-03-07 (×4): qty 1

## 2020-03-07 MED ORDER — ETOMIDATE 2 MG/ML IV SOLN
INTRAVENOUS | Status: AC
  Filled 2020-03-07: qty 20

## 2020-03-07 MED ORDER — PRISMASOL BGK 4/2.5 32-4-2.5 MEQ/L EC SOLN: Status: DC

## 2020-03-07 MED ORDER — POTASSIUM CHLORIDE 20 MEQ PO PACK
40.0000 meq | PACK | ORAL | Status: AC
  Administered 2020-03-07: 40 meq
  Filled 2020-03-07: qty 2

## 2020-03-07 MED ORDER — INSULIN ASPART 100 UNIT/ML ~~LOC~~ SOLN
0.0000 [IU] | SUBCUTANEOUS | Status: DC
  Administered 2020-03-08 – 2020-03-11 (×8): 3 [IU] via SUBCUTANEOUS
  Administered 2020-03-11: 4 [IU] via SUBCUTANEOUS
  Administered 2020-03-12 – 2020-03-13 (×5): 3 [IU] via SUBCUTANEOUS
  Administered 2020-03-13: 11 [IU] via SUBCUTANEOUS
  Administered 2020-03-13 – 2020-03-14 (×2): 4 [IU] via SUBCUTANEOUS
  Administered 2020-03-14: 11 [IU] via SUBCUTANEOUS
  Administered 2020-03-14 – 2020-03-15 (×2): 3 [IU] via SUBCUTANEOUS
  Administered 2020-03-15: 7 [IU] via SUBCUTANEOUS
  Administered 2020-03-15: 3 [IU] via SUBCUTANEOUS
  Administered 2020-03-15: 7 [IU] via SUBCUTANEOUS
  Administered 2020-03-15 (×2): 3 [IU] via SUBCUTANEOUS
  Administered 2020-03-16 (×2): 7 [IU] via SUBCUTANEOUS
  Administered 2020-03-16: 11 [IU] via SUBCUTANEOUS
  Administered 2020-03-16 – 2020-03-17 (×3): 4 [IU] via SUBCUTANEOUS
  Administered 2020-03-17 (×2): 11 [IU] via SUBCUTANEOUS
  Administered 2020-03-17: 4 [IU] via SUBCUTANEOUS
  Administered 2020-03-17 – 2020-03-18 (×3): 7 [IU] via SUBCUTANEOUS
  Administered 2020-03-18: 4 [IU] via SUBCUTANEOUS
  Administered 2020-03-18: 3 [IU] via SUBCUTANEOUS
  Administered 2020-03-18 (×2): 7 [IU] via SUBCUTANEOUS
  Administered 2020-03-19 (×2): 4 [IU] via SUBCUTANEOUS
  Administered 2020-03-19 – 2020-03-21 (×7): 3 [IU] via SUBCUTANEOUS
  Administered 2020-03-23: 4 [IU] via SUBCUTANEOUS

## 2020-03-07 MED ORDER — LABETALOL HCL 5 MG/ML IV SOLN
10.0000 mg | INTRAVENOUS | Status: DC | PRN
  Administered 2020-03-07 – 2020-03-23 (×8): 10 mg via INTRAVENOUS
  Filled 2020-03-07 (×6): qty 4

## 2020-03-07 MED ORDER — POTASSIUM CHLORIDE 10 MEQ/100ML IV SOLN
10.0000 meq | INTRAVENOUS | Status: AC
  Administered 2020-03-07 (×4): 10 meq via INTRAVENOUS
  Filled 2020-03-07 (×4): qty 100

## 2020-03-07 MED ORDER — ALTEPLASE 2 MG IJ SOLR: 2.0000 mg | Freq: Once | INTRAMUSCULAR | Status: DC | PRN

## 2020-03-07 MED ORDER — NOREPINEPHRINE 4 MG/250ML-% IV SOLN
INTRAVENOUS | Status: AC
  Administered 2020-03-07: 2 ug/min via INTRAVENOUS
  Filled 2020-03-07: qty 250

## 2020-03-07 MED ORDER — PROPOFOL 1000 MG/100ML IV EMUL
INTRAVENOUS | Status: AC
  Administered 2020-03-07: 35 ug/kg/min via INTRAVENOUS
  Filled 2020-03-07: qty 100

## 2020-03-07 MED ORDER — HEPARIN SODIUM (PORCINE) 1000 UNIT/ML DIALYSIS
1000.0000 [IU] | INTRAMUSCULAR | Status: DC | PRN
  Administered 2020-03-07 – 2020-03-13 (×2): 2400 [IU] via INTRAVENOUS_CENTRAL
  Filled 2020-03-07 (×2): qty 6
  Filled 2020-03-07: qty 2
  Filled 2020-03-07: qty 6
  Filled 2020-03-07 (×2): qty 4

## 2020-03-07 MED ORDER — NOREPINEPHRINE 4 MG/250ML-% IV SOLN: 0.0000 ug/min | INTRAVENOUS | Status: DC

## 2020-03-07 MED ORDER — CHLORHEXIDINE GLUCONATE CLOTH 2 % EX PADS
6.0000 | MEDICATED_PAD | Freq: Every day | CUTANEOUS | Status: DC
  Administered 2020-03-07 – 2020-03-26 (×14): 6 via TOPICAL

## 2020-03-07 MED ORDER — METOPROLOL TARTRATE 25 MG/10 ML ORAL SUSPENSION
12.5000 mg | Freq: Two times a day (BID) | ORAL | Status: DC
  Administered 2020-03-07: 12.5 mg
  Filled 2020-03-07 (×2): qty 5

## 2020-03-07 MED ORDER — FENTANYL CITRATE (PF) 100 MCG/2ML IJ SOLN
50.0000 ug | Freq: Once | INTRAMUSCULAR | Status: AC
  Administered 2020-03-07: 50 ug via INTRAVENOUS

## 2020-03-07 MED ORDER — SUCCINYLCHOLINE CHLORIDE 200 MG/10ML IV SOSY
PREFILLED_SYRINGE | INTRAVENOUS | Status: AC
  Filled 2020-03-07: qty 10

## 2020-03-07 MED ORDER — SODIUM CHLORIDE 0.9 % IV SOLN
3.0000 g | Freq: Three times a day (TID) | INTRAVENOUS | Status: AC
  Administered 2020-03-07 – 2020-03-12 (×15): 3 g via INTRAVENOUS
  Filled 2020-03-07: qty 8
  Filled 2020-03-07: qty 3
  Filled 2020-03-07 (×4): qty 8
  Filled 2020-03-07 (×4): qty 3
  Filled 2020-03-07 (×5): qty 8

## 2020-03-07 MED ORDER — VITAL HIGH PROTEIN PO LIQD: 1000.0000 mL | ORAL | Status: DC

## 2020-03-07 MED ORDER — NOREPINEPHRINE 4 MG/250ML-% IV SOLN
0.0000 ug/min | INTRAVENOUS | Status: DC
  Administered 2020-03-09: 3 ug/min via INTRAVENOUS
  Administered 2020-03-10 – 2020-03-12 (×2): 2 ug/min via INTRAVENOUS
  Filled 2020-03-07 (×3): qty 250

## 2020-03-07 MED ORDER — FENTANYL CITRATE (PF) 100 MCG/2ML IJ SOLN
100.0000 ug | Freq: Once | INTRAMUSCULAR | Status: AC
Start: 2020-03-07 — End: 2020-03-07
  Administered 2020-03-07: 100 ug via INTRAVENOUS
  Filled 2020-03-07: qty 2

## 2020-03-07 MED ORDER — ASPIRIN 81 MG PO CHEW
81.0000 mg | CHEWABLE_TABLET | Freq: Every day | ORAL | Status: DC
  Administered 2020-03-07 – 2020-03-12 (×5): 81 mg
  Filled 2020-03-07 (×5): qty 1

## 2020-03-07 MED ORDER — INSULIN ASPART 100 UNIT/ML ~~LOC~~ SOLN
0.0000 [IU] | SUBCUTANEOUS | Status: DC
  Administered 2020-03-07: 2 [IU] via SUBCUTANEOUS

## 2020-03-07 MED ORDER — ORAL CARE MOUTH RINSE
15.0000 mL | OROMUCOSAL | Status: DC
  Administered 2020-03-07 – 2020-03-10 (×34): 15 mL via OROMUCOSAL

## 2020-03-07 MED ORDER — FENTANYL CITRATE (PF) 100 MCG/2ML IJ SOLN: 50.0000 ug | INTRAMUSCULAR | Status: DC | PRN

## 2020-03-07 MED ORDER — MIDAZOLAM HCL 2 MG/2ML IJ SOLN
2.0000 mg | INTRAMUSCULAR | Status: DC | PRN
  Administered 2020-03-07: 2 mg via INTRAVENOUS
  Filled 2020-03-07: qty 2

## 2020-03-07 MED ORDER — PROPOFOL 1000 MG/100ML IV EMUL
0.0000 ug/kg/min | INTRAVENOUS | Status: DC
  Administered 2020-03-07: 20 ug/kg/min via INTRAVENOUS
  Administered 2020-03-07: 5 ug/kg/min via INTRAVENOUS
  Administered 2020-03-08: 20 ug/kg/min via INTRAVENOUS
  Administered 2020-03-08 (×2): 30 ug/kg/min via INTRAVENOUS
  Administered 2020-03-08 (×2): 40 ug/kg/min via INTRAVENOUS
  Administered 2020-03-08: 20 ug/kg/min via INTRAVENOUS
  Administered 2020-03-08: 30 ug/kg/min via INTRAVENOUS
  Administered 2020-03-09: 20 ug/kg/min via INTRAVENOUS
  Administered 2020-03-09: 30 ug/kg/min via INTRAVENOUS
  Administered 2020-03-09: 20 ug/kg/min via INTRAVENOUS
  Administered 2020-03-09 – 2020-03-10 (×2): 25 ug/kg/min via INTRAVENOUS
  Administered 2020-03-10: 20 ug/kg/min via INTRAVENOUS
  Administered 2020-03-10: 25 ug/kg/min via INTRAVENOUS
  Filled 2020-03-07 (×17): qty 100

## 2020-03-07 MED ORDER — LORAZEPAM 2 MG/ML IJ SOLN
2.0000 mg | Freq: Once | INTRAMUSCULAR | Status: AC
  Administered 2020-03-07: 2 mg via INTRAVENOUS
  Filled 2020-03-07: qty 1

## 2020-03-07 MED ORDER — CHLORHEXIDINE GLUCONATE 0.12% ORAL RINSE (MEDLINE KIT)
15.0000 mL | Freq: Two times a day (BID) | OROMUCOSAL | Status: DC
  Administered 2020-03-07 – 2020-03-10 (×8): 15 mL via OROMUCOSAL

## 2020-03-07 MED ORDER — FENTANYL 2500MCG IN NS 250ML (10MCG/ML) PREMIX INFUSION: 50.0000 ug/h | INTRAVENOUS | Status: DC

## 2020-03-07 MED ORDER — SODIUM CHLORIDE 0.9% FLUSH: 10.0000 mL | INTRAVENOUS | Status: DC | PRN

## 2020-03-07 MED ORDER — POTASSIUM CHLORIDE 20 MEQ PO PACK
60.0000 meq | PACK | Freq: Once | ORAL | Status: DC
  Administered 2020-03-07: 60 meq

## 2020-03-07 MED ORDER — ROCURONIUM BROMIDE 10 MG/ML (PF) SYRINGE
PREFILLED_SYRINGE | INTRAVENOUS | Status: AC
  Filled 2020-03-07: qty 10

## 2020-03-07 MED ORDER — LEVETIRACETAM IN NACL 500 MG/100ML IV SOLN
500.0000 mg | INTRAVENOUS | Status: DC
  Administered 2020-03-07 – 2020-03-14 (×8): 500 mg via INTRAVENOUS
  Filled 2020-03-07 (×9): qty 100

## 2020-03-07 MED ORDER — HEPARIN SODIUM (PORCINE) 1000 UNIT/ML DIALYSIS: 1000.0000 [IU] | INTRAMUSCULAR | Status: DC | PRN

## 2020-03-07 MED ORDER — FENTANYL 2500MCG IN NS 250ML (10MCG/ML) PREMIX INFUSION
0.0000 ug/h | INTRAVENOUS | Status: DC
  Administered 2020-03-07: 200 ug/h via INTRAVENOUS
  Administered 2020-03-07: 25 ug/h via INTRAVENOUS
  Administered 2020-03-08 – 2020-03-10 (×5): 250 ug/h via INTRAVENOUS
  Administered 2020-03-10: 200 ug/h via INTRAVENOUS
  Filled 2020-03-07 (×10): qty 250

## 2020-03-07 MED ORDER — MAGNESIUM SULFATE 4 GM/100ML IV SOLN
4.0000 g | Freq: Once | INTRAVENOUS | Status: AC
  Administered 2020-03-07: 4 g via INTRAVENOUS
  Filled 2020-03-07 (×2): qty 100

## 2020-03-07 MED ORDER — SODIUM CHLORIDE 0.9 % FOR CRRT: INTRAVENOUS_CENTRAL | Status: DC | PRN

## 2020-03-07 MED ORDER — POTASSIUM CHLORIDE 20 MEQ PO PACK: 120.0000 meq | PACK | Freq: Once | ORAL | Status: DC

## 2020-03-07 MED ORDER — ETOMIDATE 2 MG/ML IV SOLN
INTRAVENOUS | Status: AC | PRN
  Administered 2020-03-07: 20 mg via INTRAVENOUS

## 2020-03-07 MED ORDER — PERFLUTREN LIPID MICROSPHERE
1.0000 mL | INTRAVENOUS | Status: AC | PRN
  Administered 2020-03-07: 5 mL via INTRAVENOUS
  Filled 2020-03-07: qty 10

## 2020-03-07 MED ORDER — PROPOFOL 1000 MG/100ML IV EMUL: 5.0000 ug/kg/min | INTRAVENOUS | Status: DC

## 2020-03-07 MED ORDER — NOREPINEPHRINE 4 MG/250ML-% IV SOLN: 0.0000 ug/kg/min | INTRAVENOUS | Status: DC

## 2020-03-07 MED ORDER — POTASSIUM CHLORIDE 20 MEQ PO PACK
80.0000 meq | PACK | Freq: Once | ORAL | Status: DC
  Administered 2020-03-07: 80 meq

## 2020-03-07 MED ORDER — FUROSEMIDE 10 MG/ML IJ SOLN
40.0000 mg | Freq: Once | INTRAMUSCULAR | Status: AC
  Administered 2020-03-07: 40 mg via INTRAVENOUS
  Filled 2020-03-07: qty 4

## 2020-03-07 MED ORDER — MIDAZOLAM HCL 2 MG/2ML IJ SOLN: 2.0000 mg | INTRAMUSCULAR | Status: DC | PRN

## 2020-03-07 MED ORDER — FENTANYL CITRATE (PF) 100 MCG/2ML IJ SOLN
50.0000 ug | INTRAMUSCULAR | Status: DC | PRN
  Administered 2020-03-07: 100 ug via INTRAVENOUS
  Filled 2020-03-07: qty 4

## 2020-03-07 MED ORDER — FENTANYL BOLUS VIA INFUSION
50.0000 ug | INTRAVENOUS | Status: DC | PRN
  Filled 2020-03-07: qty 50

## 2020-03-07 MED ORDER — ALBUTEROL SULFATE (2.5 MG/3ML) 0.083% IN NEBU
2.5000 mg | INHALATION_SOLUTION | Freq: Four times a day (QID) | RESPIRATORY_TRACT | Status: DC
  Administered 2020-03-07 – 2020-03-08 (×7): 2.5 mg via RESPIRATORY_TRACT
  Filled 2020-03-07 (×7): qty 3

## 2020-03-07 MED ORDER — LAMOTRIGINE 100 MG PO TABS
100.0000 mg | ORAL_TABLET | Freq: Two times a day (BID) | ORAL | Status: DC
  Administered 2020-03-07 – 2020-03-12 (×8): 100 mg
  Filled 2020-03-07 (×13): qty 1
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
## 2020-03-07 NOTE — Progress Notes (Signed)
Patient transported to [id number] without complications.

## 2020-03-07 NOTE — Progress Notes (Signed)
vLTM started following spot EEG (same leads) notified neuro
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
## 2020-03-07 NOTE — Procedures (Signed)
Patient Name: Max Pittman.  MRN: [id number]  Epilepsy Attending: Lora Havens  Referring Physician/Provider: Montey Hora, PA Date: 03/07/2020 Duration: 23.36 mins  Patient history: 45yo M s/p cardiac arrest. EEG to evaluate for seizure  Level of alertness:  comatose  AEDs during EEG study: LEV, LTG, Propofol  Technical aspects: This EEG study was done with scalp electrodes positioned according to the 10-20 International system of electrode placement. Electrical activity was acquired at a sampling rate of '500Hz'$  and reviewed with a high frequency filter of '70Hz'$  and a low frequency filter of '1Hz'$ . EEG data were recorded continuously and digitally stored.   Description: EEG showed continuous generalized 5 to 6 Hz theta slowing as well as intermittent 2-'3Hz'$  delta slowing. Hyperventilation and photic stimulation were not performed.     ABNORMALITY -Continuous slow, generalized  IMPRESSION: This study is suggestive of moderate diffuse encephalopathy, nonspecific etiology. No seizures or epileptiform discharges were seen throughout the recording.  Anari Evitt Barbra Sarks
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
## 2020-03-07 NOTE — Progress Notes (Signed)
Pharmacy Antibiotic Note  Max Pittman. is a 45 y.o. male admitted on 03/07/2020 with aspiration PNA.  Pharmacy has been consulted for Unasyn dosing.  Pt initiating CRRT today, cultures pending.  Afebrile  Plan: Unasyn 3g IV q 8 hrs while on CRRT. F/u cultures, clinical couse. Continue to watch plans for dialysis and adjust dosing as necessary.  Height: '5\' 9"'$  (175.3 cm) Weight: (S) (!) 158 kg (348 lb 5.2 oz) IBW/kg (Calculated) : 70.7  Temp (24hrs), Avg:96.6 F (35.9 C), Min:94.6 F (34.8 C), Max:97.5 F (36.4 C)  Recent Labs  Lab 03/07/20 0239  WBC 10.0  CREATININE 7.76*    Estimated Creatinine Clearance: 18.3 mL/min (A) (by C-G formula based on SCr of 7.76 mg/dL (H)).    Allergies  Allergen Reactions  . Doxycycline Hives  . Latex Swelling    Pt reports swelling at site.   . Morphine And Related Anxiety    Antimicrobials this admission: Unasyn 2/10 >   Dose adjustments this admission:  Microbiology results: 2/10 Resp cx> 2/10 BCx x 2 >   Thank you for allowing pharmacy to be a part of this patient's care.  [place] Crane, Roylene Reason, BCCP Clinical Pharmacist  03/07/2020 10:57 AM   [facility] pharmacy phone numbers are listed on [email or web address]

## 2020-03-07 NOTE — Progress Notes (Signed)
Initial Nutrition Assessment  DOCUMENTATION CODES:   Morbid obesity  INTERVENTION:   Tube Feeding via OG:  Vital High Protein at 65 ml/hr Provides 149 g of protein, 1640 kcals and 1310 mL of free water   NUTRITION DIAGNOSIS:   Inadequate oral intake related to acute illness as evidenced by NPO status.  GOAL:   Patient will meet greater than or equal to 90% of their needs  MONITOR:   Vent status,Labs,Weight trends,TF tolerance  REASON FOR ASSESSMENT:   Ventilator,Consult Enteral/tube feeding initiation and management  ASSESSMENT:   45 yo male admitted post cardiac arrest with acute respiratory failure secondary to acute pulmonary edema requiring intubation PMH includes ESRD on PD, DM, HTN, OSA   TTM 36 degrees Pt currently sedated on vent  TF protocol ordered this AM, TF not yet initiated  Plans to start CRRT for fluid removal, PD on hold  Current wt 158 kg; last documented weight of 128 kg on 12/04/19. Unsure of dry weight at this time. Noted 3-4+ edema in all areas  Labs: potassium 3.0 (L) Meds: KCl, ss novolog, miralax   Diet Order:   Diet Order    None      EDUCATION NEEDS:   Not appropriate for education at this time  Skin:  Skin Assessment: Reviewed RN Assessment  Last BM:  PTA  Height:   Ht Readings from Last 1 Encounters:  03/07/20 '5\' 9"'$  (1.753 m)    Weight:   Wt Readings from Last 1 Encounters:  03/07/20 (S) (!) 158 kg   BMI:  Body mass index is 51.44 kg/m.  Estimated Nutritional Needs:   Kcal:  [id number] kcals  Protein:  145-180 g  Fluid:  >/= 1.8 L   Kerman Passey MS, RDN, LDN, CNSC Registered Dietitian III Clinical Nutrition RD Pager and On-Call Pager Number Located in [place]

## 2020-03-07 NOTE — Procedures (Signed)
Arterial Catheter Insertion Procedure Note  Tyrek Ahuna  [id number]  05/25/1975  Date:03/07/20  Time:6:23 AM    Provider Performing: Clance Boll    Procedure: Insertion of Arterial Line [phone]) without US guidance  Indication(s) Blood pressure monitoring and/or need for frequent ABGs  Consent Unable to obtain consent due to emergent nature of procedure.  Anesthesia None   Time Out Verified patient identification, verified procedure, site/side was marked, verified correct patient position, special equipment/implants available, medications/allergies/relevant history reviewed, required imaging and test results available.   Sterile Technique Maximal sterile technique including full sterile barrier drape, hand hygiene, sterile gown, sterile gloves, mask, hair covering, sterile ultrasound probe cover (if used).   Procedure Description Area of catheter insertion was cleaned with chlorhexidine and draped in sterile fashion. Without real-time ultrasound guidance an arterial catheter was placed into the right radial artery.  Appropriate arterial tracings confirmed on monitor.     Complications/Tolerance None; patient tolerated the procedure well.   EBL Minimal   Specimen(s) None
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
## 2020-03-07 NOTE — Progress Notes (Signed)
eLink Physician-Brief Progress Note Patient Name: Max Pittman. DOB: [birth year] MRN: [id number]   Date of Service  03/07/2020  HPI/Events of Note  Hypertension - BP = 194/110 by A-line. HR = 92. QTc interval = 0.532 seconds. Therefore, unable to use Nicardipine IV infusion.   eICU Interventions  Plan: 1. Labetalol 10 mg Q 1 hour PRN SBP > 170 or DBP > 100.     Intervention Category Major Interventions: Hypertension - evaluation and management  [name] 03/07/2020, 6:30 AM

## 2020-03-07 NOTE — Progress Notes (Signed)
ANTICOAGULATION CONSULT NOTE - Initial Consult  Pharmacy Consult for IV heparin when INR < 2 Indication: Hx PE, Chronic DVT  Allergies  Allergen Reactions  . Doxycycline Hives  . Latex Swelling    Pt reports swelling at site.   . Morphine And Related Anxiety    Patient Measurements: Height: '5\' 9"'$  (175.3 cm) Weight: (S) (!) 158 kg (348 lb 5.2 oz) IBW/kg (Calculated) : 70.7 Heparin Dosing Weight: 95 kg  Vital Signs: Temp: 97.2 F (36.2 C) (02/10 1000) Temp Source: Bladder (02/10 0800) BP: 175/83 (02/10 1000) Pulse Rate: 79 (02/10 1000)  Labs: Recent Labs    03/06/20 1020 03/07/20 0239 03/07/20 0239 03/07/20 0344 03/07/20 0625  HGB  --  9.2*   < > 8.5* 16.0  HCT  --  29.9*  --  25.0* 47.0  PLT  --  209  --   --   --   LABPROT  --  36.5*  --   --   --   INR 3.7* 3.8*  --   --   --   CREATININE  --  7.76*  --   --   --   TROPONINIHS  --  13  --   --   --    < > = values in this interval not displayed.    Estimated Creatinine Clearance: 18.3 mL/min (A) (by C-G formula based on SCr of 7.76 mg/dL (H)).   Medical History: Past Medical History:  Diagnosis Date  . Arthritis   . Closed dislocation of left hip ([place]) 05/21/2019  . Diabetes ([place])   . Diabetes mellitus without complication ([place])   . DVT (deep venous thrombosis) ([place])   . History of peritoneal dialysis   . Hypertension   . Renal disorder    FFGS  . Renal disorder   . Sleep apnea   . Stroke [facility])    when ha was a child  . Vasovagal syndrome    with syncope    Medications:  Infusions:  . ampicillin-sulbactam (UNASYN) IV    . fentaNYL infusion INTRAVENOUS 200 mcg/hr (03/07/20 0925)  . levETIRAcetam 500 mg (03/07/20 1020)  . potassium chloride 10 mEq (03/07/20 1021)  . propofol (DIPRIVAN) infusion      Assessment: 45 yo male with ESRD on PD.  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  INR elevated at admission (3.8).  No overt bleeding or complications noted.    Pharmacy asked to start IV  heparin once INR < 2.  Goal of Therapy:  Heparin level 0.3-0.7 units/ml Monitor platelets by anticoagulation protocol: Yes   Plan:  Hold Heparin for now. F/u AM INR.  Start heparin when INR < 2.  [place] Crane, Vena Austria, BCPS, [employer] Clinical Pharmacist  03/07/2020 10:55 AM   [facility] pharmacy phone numbers are listed on [email or web address]

## 2020-03-07 NOTE — Procedures (Addendum)
Hemodialysis Insertion Procedure Note Max Pittman [id number] 1975-08-24  Procedure: Insertion of Hemodialysis Catheter Type: 3 port  Indications: Hemodialysis   Procedure Details Consent: Risks of procedure as well as the alternatives and risks of each were explained to the (patient/caregiver).  Consent for procedure obtained. Time Out: Verified patient identification, verified procedure, site/side was marked, verified correct patient position, special equipment/implants available, medications/allergies/relevent history reviewed, required imaging and test results available. Time-out as described was performed  Maximum sterile technique was used including antiseptics, cap, gloves, gown, hand hygiene, mask and sheet. Skin prep: Chlorhexidine; local anesthetic administered A antimicrobial bonded/coated triple lumen catheter was placed in the right internal jugular vein using the Seldinger technique. Ultrasound guidance used.Yes.   A 15 cm HD catheter was placed to 15 cm in the right IJ under direct US guidance. Blood aspirated via all 3 ports and then flushed x 3. Line sutured x 2 and dressing applied with antimicrobial biopatch.  Evaluation Blood flow good Complications: No apparent complications Patient did tolerate procedure well. Chest X-ray ordered to verify placement.  CXR: pending.  Lawerance Cruel, D.O.  Internal Medicine Resident, PGY-2 [facility]  Pager: [phone] 12:30 PM, 03/07/2020    Procedure directly supervised by Marni Griffon, NP.  Julian Hy, DO 03/07/20 1:28 PM [facility]  From 7AM- 7PM if no response to pager, please call [phone]. After hours, 7PM- 7AM, please call Elink  [phone].

## 2020-03-07 NOTE — Consult Note (Signed)
Cardiology Consultation:   Patient ID: Max Pittman. MRN: [id number]; DOB: 18-Feb-1975  Admit date: 03/07/2020 Date of Consult: 03/07/2020  Primary Care Provider: Chesley Noon, MD Primary Cardiologist: No primary care provider on file.  Primary Electrophysiologist:  None    Patient Profile:   Max Pittman. is a 45 y.o. 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 Carson Myrtle.  History of Present Illness:   Max Pittman was brought in by EMS after cardiac arrest. EMS was called due to difficulty breathing. He was found to be hypoxic and subsequently suffered asystolic cardiorespiratory arrest in the ambulance. He received 12 minutes of CPR. On arrival to the ED, he had a heart rate of 90 and BP 137/98. He was intubated in the ED. Initial blood gas was 7.34 / 53 / 72 on mechanical ventilation. He is currently on fentanyl for sedation and no vasoactive drips.   Corroborative information provided by EMS and ED staff indicate that he has had worsening dyspnea recently due to difficulty with fluid removal using peritoneal dialysis.   Heart Pathway Score:     Past Medical History:  Diagnosis Date  . Arthritis   . Closed dislocation of left hip ([place]) 05/21/2019  . Diabetes ([place])   . Diabetes mellitus without complication ([place])   . DVT (deep venous thrombosis) ([place])   . History of peritoneal dialysis   . Hypertension   . Renal disorder    FFGS  . Renal disorder   . Sleep apnea   . Stroke [facility])    when ha was a child  . Vasovagal syndrome    with syncope    Past Surgical History:  Procedure Laterality Date  . AMPUTATION Right 07/21/2019   Procedure: Right index finger amputation as  necessary at distal interphalangeal joint;  Surgeon: Roseanne Kaufman, MD;  Location: [place];  Service: Orthopedics;  Laterality: Right;  60 mins  . APPLICATION OF WOUND VAC Left 05/22/2019   Procedure: APPLICATION OF WOUND VAC  LEFT HIP;  Surgeon: Altamese Montrose, MD;  Location: [place];  Service: Orthopedics;  Laterality: Left;  . APPLICATION OF WOUND VAC Left 07/17/2019   Procedure: WOUND VAC CHANGE;  Surgeon: Altamese Rolesville, MD;  Location: [place];  Service: Orthopedics;  Laterality: Left;  . AV FISTULA INSERTION W/ RF MAGNETIC GUIDANCE Left   . AV FISTULA PLACEMENT    . BUBBLE STUDY  05/25/2019   Procedure: BUBBLE STUDY;  Surgeon: Dorothy Spark, MD;  Location: [place];  Service: Cardiovascular;;  . CHOLECYSTECTOMY    . HERNIA REPAIR    . HIP CLOSED REDUCTION Left 05/20/2019   Procedure: CLOSED REDUCTION HIP WITH TRACTION PIN APPLICATION;  Surgeon: Altamese Miller, MD;  Location: [place];  Service: Orthopedics;  Laterality: Left;  . INCISION AND DRAINAGE HIP Left 07/14/2019   Procedure: IRRIGATION AND DEBRIDEMENT HIP;  Surgeon: Altamese Mason, MD;  Location: [place];  Service: Orthopedics;  Laterality: Left;  . INCISION AND DRAINAGE HIP Left 07/17/2019   Procedure: REPEAT IRRIGATION AND DEBRIDEMENT LEFT HIP;  Surgeon: Altamese Coldfoot, MD;  Location: [place];  Service: Orthopedics;  Laterality: Left;  . IR FLUORO GUIDE CV LINE RIGHT  05/23/2019  . IR  FLUORO GUIDE CV LINE RIGHT  07/21/2019  . IR REMOVAL TUN CV CATH W/O FL  08/29/2019  . IR US GUIDE VASC ACCESS RIGHT  05/23/2019  . IR US GUIDE VASC ACCESS RIGHT  07/21/2019  . LEFT HEART CATH AND CORONARY ANGIOGRAPHY N/A 03/22/2019   Procedure: LEFT HEART CATH AND CORONARY ANGIOGRAPHY;  Surgeon: Wellington Hampshire, MD;  Location: [place] CV LAB;  Service: Cardiovascular;  Laterality: N/A;  . LOOP RECORDER INSERTION N/A 05/25/2019   Procedure: LOOP RECORDER INSERTION;  Surgeon: [name], MD;  Location: [place] CV LAB;  Service: Cardiovascular;   Laterality: N/A;  . ORIF ACETABULAR FRACTURE Left 05/22/2019   Procedure: OPEN REDUCTION INTERNAL FIXATION (ORIF) T TYPE  WITH ASSOCIATED POSTERIOR WALL ACETABULAR FRACTURE, LEFT; REMOVAL OF TRACTION PIN LEFT TIBIA;  Surgeon: Altamese [place], MD;  Location: [place];  Service: Orthopedics;  Laterality: Left;  . REMOVAL OF A DIALYSIS CATHETER Right 07/17/2019   Procedure: REMOVAL OF HEMODIALYSIS DIALYSIS CATHETER;  Surgeon: Altamese Lawrenceville, MD;  Location: [place];  Service: Orthopedics;  Laterality: Right;  . TEE WITHOUT CARDIOVERSION N/A 05/25/2019   Procedure: TRANSESOPHAGEAL ECHOCARDIOGRAM (TEE);  Surgeon: Dorothy Spark, MD;  Location: [facility] ENDOSCOPY;  Service: Cardiovascular;  Laterality: N/A;  . TEE WITHOUT CARDIOVERSION N/A 07/19/2019   Procedure: TRANSESOPHAGEAL ECHOCARDIOGRAM (TEE);  Surgeon: Sueanne Margarita, MD;  Location: [facility] ENDOSCOPY;  Service: Cardiovascular;  Laterality: N/A;  . TONSILLECTOMY       Home Medications:  Prior to Admission medications   Medication Sig Start Date End Date Taking? Authorizing Provider  acetaminophen (TYLENOL) 325 MG tablet Take 2 tablets (650 mg total) by mouth every 8 (eight) hours. 06/16/19   Angiulli, Lavon Paganini, PA-C  amLODipine (NORVASC) 2.5 MG tablet Take 2.5 mg by mouth daily. 11/12/19   [provider]  amLODipine (NORVASC) 5 MG tablet Take 1 tablet (5 mg total) by mouth daily. Patient not taking: Reported on 12/18/2019 07/07/19 12/18/19  Alma Friendly, MD  aspirin EC 81 MG EC tablet Take 1 tablet (81 mg total) by mouth daily. 06/17/19   Angiulli, Lavon Paganini, PA-C  atorvastatin (LIPITOR) 40 MG tablet Take 1 tablet (40 mg total) by mouth daily at 6 PM. 06/16/19   Angiulli, Lavon Paganini, PA-C  baclofen (LIORESAL) 10 MG tablet Take 10 mg by mouth 3 (three) times daily. 12/12/19   [provider]  buPROPion (WELLBUTRIN SR) 150 MG 12 hr tablet Take 1 tablet (150 mg total) by mouth 2 (two) times daily with a meal. 06/16/19   Angiulli, Lavon Paganini, PA-C   cephALEXin (KEFLEX) 250 MG capsule Take 1 capsule (250 mg total) by mouth every 12 (twelve) hours. 12/18/19    Callas, NP  docusate sodium (COLACE) 100 MG capsule Take 300 mg by mouth daily as needed for mild constipation or moderate constipation. As directed  05/16/19   [provider]  FLUoxetine (PROZAC) 10 MG tablet Take 10 mg by mouth daily.    [provider]  gentamicin cream (GARAMYCIN) 0.1 % Apply 1 application topically daily. 07/26/19   Allie Bossier, MD  hydrOXYzine (ATARAX/VISTARIL) 25 MG tablet Take by mouth. 10/18/19   [provider]  KLOR-CON M20 20 MEQ tablet Take 20 mEq by mouth daily. 12/12/19   [provider]  lamoTRIgine (LAMICTAL) 100 MG tablet Take 1 tablet (100 mg total) by mouth 2 (two) times daily. 01/15/20   Frann Rider, NP  levETIRAcetam (KEPPRA) 500 MG tablet Take 1 tablet (500 mg  total) by mouth daily. 12/04/19   Frann Rider, NP  loratadine (CLARITIN) 10 MG tablet Take 1 tablet (10 mg total) by mouth daily. 06/17/19   Angiulli, Lavon Paganini, PA-C  metoprolol tartrate (LOPRESSOR) 25 MG tablet Take 0.5 tablets (12.5 mg total) by mouth 2 (two) times daily. 07/06/19 12/18/19  Alma Friendly, MD  omeprazole (PRILOSEC) 20 MG capsule Take 1 capsule (20 mg total) by mouth daily. Patient taking differently: Take 40 mg by mouth daily.  06/16/19   Angiulli, Lavon Paganini, PA-C  omeprazole (PRILOSEC) 40 MG capsule Take 40 mg by mouth daily. 10/24/19   [provider]  pioglitazone (ACTOS) 15 MG tablet Take 1 tablet (15 mg total) by mouth daily. 06/17/19   Angiulli, Lavon Paganini, PA-C  potassium chloride (KLOR-CON) 10 MEQ tablet Take 2 tablets (20 mEq total) by mouth daily. 07/28/19   Geradine Girt, DO  pregabalin (LYRICA) 50 MG capsule Take 1 capsule (50 mg total) by mouth at bedtime. Patient taking differently: Take 50 mg by mouth in the morning and at bedtime.  06/16/19   Angiulli, Lavon Paganini, PA-C  sevelamer carbonate (RENVELA)  800 MG tablet Take 3 tablets (2,400 mg total) by mouth 3 (three) times daily with meals. 06/16/19   Angiulli, Lavon Paganini, PA-C  warfarin (COUMADIN) 2 MG tablet Take 2 tablets daily except 1 1/2 tablets on Sundays and Wednesdays or as directed 12/14/19   Verta Ellen., NP    Inpatient Medications: Scheduled Meds: . aspirin  81 mg Per Tube Daily  . docusate  100 mg Per Tube BID  . etomidate      . fentaNYL (SUBLIMAZE) injection  50 mcg Intravenous Once  . insulin aspart  0-9 Units Subcutaneous Q4H  . lamoTRIgine  100 mg Per Tube BID  . pantoprazole (PROTONIX) IV  40 mg Intravenous QHS  . polyethylene glycol  17 g Per Tube Daily  . potassium chloride  80 mEq Per Tube Once  . succinylcholine       Continuous Infusions: . sodium chloride    . fentaNYL infusion INTRAVENOUS 50 mcg/hr (03/07/20 0339)  . levETIRAcetam    . norepinephrine (LEVOPHED) Adult infusion     PRN Meds: fentaNYL, midazolam, midazolam  Allergies:    Allergies  Allergen Reactions  . Doxycycline Hives  . Latex Swelling    Pt reports swelling at site.   . Morphine And Related Anxiety    Social History:   Social History   Socioeconomic History  . Marital status: Single    Spouse name: Not on file  . Number of children: Not on file  . Years of education: Not on file  . Highest education level: Not on file  Occupational History  . Occupation: Unemployed  Tobacco Use  . Smoking status: Former Smoker    Quit date: 11/16/2018    Years since quitting: 1.3  . Smokeless tobacco: Never Used  Vaping Use  . Vaping Use: Never used  Substance and Sexual Activity  . Alcohol use: Yes    Comment: occ  . Drug use: Never  . Sexual activity: Not Currently  Other Topics Concern  . Not on file  Social History Narrative   ** Merged History Encounter **       Social Determinants of Health   Financial Resource Strain: Not on file  Food Insecurity: Not on file  Transportation Needs: Not on file  Physical  Activity: Not on file  Stress: Not on file  Social Connections:  Not on file  Intimate Partner Violence: Not on file    Family History:   Family History  Problem Relation Age of Onset  . Heart disease Mother   . Diabetes Mother   . Hypertension Mother   . Diabetes Father   . Hypertension Father   . CAD Mother        "angina"  . Hypercholesterolemia Mother   . Hypercholesterolemia Father   . Healthy Brother      ROS:   Review of Systems: unable to obtain  Physical Exam/Data:   Vitals:   03/07/20 0330 03/07/20 0345 03/07/20 0400 03/07/20 0412  BP: 115/67 114/60 123/70   Pulse: 80 (!) 155 (!) 156   Resp: (!) '24 18 17   '$ Temp: (!) 96.6 F (35.9 C) (!) 96.5 F (35.8 C) (!) 96.4 F (35.8 C)   TempSrc:      SpO2: 92% (!) 83% (!) 81% 100%  Weight:      Height:       No intake or output data in the 24 hours ending 03/07/20 0444 Last 3 Weights 03/07/2020 03/07/2020 12/04/2019  Weight (lbs) 348 lb 5.2 oz 286 lb 9.6 oz 284 lb 2.8 oz  Weight (kg) 158 kg 130 kg 128.9 kg     Body mass index is 51.44 kg/m.  General:  Intubated, sedated, calm HEENT: normal Lymph: no adenopathy Neck: difficult to assess Endocrine:  No thryomegaly Vascular: No carotid bruits; FA pulses 1+ bilaterally without bruits  Cardiac:  Distant heart sounds, normal S1, S2; RRR; no murmur audible Lungs:  Coarse mechanical breath sounds with inspiratory and expiratory wheezing  Abd: distended, soft Ext: 2+ pitting edema of bilateral lower extremities, flank, and scrotum.  Musculoskeletal:  No deformities, BUE and BLE strength normal and equal Skin: warm and dry. Petechiae are present in the lower extremities Neuro: moving all extremities. Pupils round and reactive.  Psych:  Unable to assess  EKG:  The EKG was personally reviewed and demonstrates:  Sinus rhythm with low voltage throughout Telemetry:  Telemetry was personally reviewed and demonstrates:  Sinus rhythm with rare ectopy  Relevant CV  Studies:  Transthoracic echo 07/15/2019 showed normal LV and RV function and no major valvular disease.   Cardiac catheterization 03/22/2019 showed moderate one-vessel coronary artery disease involving the proximal right coronary artery, significant spasm in the ostial proximal RCA which improved significantly with intracoronary nitroglycerin, and normal LV systolic function and mildly elevated left ventricular end-diastolic pressure.   Laboratory Data:  High Sensitivity Troponin:   Recent Labs  Lab 03/07/20 0239  TROPONINIHS 13     Chemistry Recent Labs  Lab 03/07/20 0239 03/07/20 0344  NA 137 142  K 2.8* 2.8*  CL 98  --   CO2 22  --   GLUCOSE 253*  --   BUN 14  --   CREATININE 7.76*  --   CALCIUM 8.4*  --   GFRNONAA 8*  --   ANIONGAP 17*  --     No results for input(s): PROT, ALBUMIN, AST, ALT, ALKPHOS, BILITOT in the last 168 hours. Hematology Recent Labs  Lab 03/07/20 0239 03/07/20 0344  WBC 10.0  --   RBC 3.37*  --   HGB 9.2* 8.5*  HCT 29.9* 25.0*  MCV 88.7  --   MCH 27.3  --   MCHC 30.8  --   RDW 15.7*  --   PLT 209  --    BNPNo results for input(s): BNP, PROBNP in the last  168 hours.  DDimer No results for input(s): DDIMER in the last 168 hours.   Radiology/Studies:  DG Chest Portable 1 View  Result Date: 03/07/2020 CLINICAL DATA:  Intubation EXAM: PORTABLE CHEST 1 VIEW COMPARISON:  07/13/2019 FINDINGS: Endotracheal tube is just above the carina by 7 mm. This could be retracted slightly for optimal positioning. NG tube is in the stomach. Severe diffuse bilateral airspace disease. Heart is borderline in size. Suspect small effusions. IMPRESSION: Endotracheal tube is just above the level of the carina. This could be retracted 2-3 cm for optimal positioning. NG tube in the stomach. Severe diffuse bilateral airspace disease could reflect edema or infection. Electronically Signed   By: Rolm Baptise M.D.   On: 03/07/2020 03:11    Assessment and Plan:   45  year old gentleman who suffered PEA cardiac arrest which was most likely the result of volume overload due to end stage renal disease. He has no evidence of acute coronary syndrome, decompensated heart failure, or valve degeneration as the cause of his event. He is currently not requiring hemodynamic support.   - Continue standard post-arrest care by the ICU team, including cooling protocol, mechanical ventilation, and vasopressor support as needed.  - Recommend trans-thoracic echo to assess LVEF and valve position after he is stabilized  - Aggressive volume removal with hemodialysis / ultrafiltration   [facility] will sign off.   Medication Recommendations:  Resume home aspirin, warfarin, and metoprolol when stable enough to do so.  Other recommendations (labs, testing, etc):  Transthoracic echo Follow up as an outpatient:  As previously scheduled   For questions or updates, please contact [facility] Please consult [email or web address] for contact info under   Signed, Osvaldo Shipper, MD  03/07/2020 4:44 AM

## 2020-03-07 NOTE — ED Triage Notes (Signed)
Patient arrives from home with [facility], patient called for shortness of breath and difficulty breathing, hx of CHF and PD, dialysis nurse concerned patient was in fluid overload but patient was refusing to come to ER, EMS reports initial O2 was 71% on RA, as patient was being transported, he lost pulses, 12-13 minutes CPR, 1 epi, 1 bicarb, 1 calcium given. Now ST.  CBG 238 R tibial IO

## 2020-03-07 NOTE — Progress Notes (Signed)
eLink Physician-Brief Progress Note Patient Name: Max Pittman. DOB: 16-Mar-1975 MRN: [id number]   Date of Service  03/07/2020  HPI/Events of Note  Patient slightly hypotensive (MAP ~ 59) while on CRRT. UF rate 200cc/hr. Had been tolerating this during daytime. Still on metoprolol.   eICU Interventions  D/c metoprolol.  Will order low dose levophed 0-0.2 mcg/kg/min to assist with ultrafiltration target. If unable to meet MAP goal with this rate of levophed, then we may need to reduce UF rate.     Intervention Category Intermediate Interventions: Hypotension - evaluation and management  Marily Lente Jacqui Headen 03/07/2020, 11:10 PM

## 2020-03-07 NOTE — Progress Notes (Signed)
  Echocardiogram 2D Echocardiogram has been performed with Definity.  Max Pittman 03/07/2020, 5:05 PM

## 2020-03-07 NOTE — Progress Notes (Signed)
EEG completed, results pending. 

## 2020-03-08 ENCOUNTER — Inpatient Hospital Stay (HOSPITAL_COMMUNITY): Payer: Medicare Other

## 2020-03-08 DIAGNOSIS — I469 Cardiac arrest, cause unspecified: Secondary | ICD-10-CM | POA: Diagnosis not present

## 2020-03-08 DIAGNOSIS — Z9911 Dependence on respirator [ventilator] status: Secondary | ICD-10-CM | POA: Diagnosis not present

## 2020-03-08 DIAGNOSIS — G9340 Encephalopathy, unspecified: Secondary | ICD-10-CM | POA: Diagnosis not present

## 2020-03-08 DIAGNOSIS — J9601 Acute respiratory failure with hypoxia: Secondary | ICD-10-CM | POA: Diagnosis not present

## 2020-03-08 LAB — CBC
HCT: 24.4 % — ABNORMAL LOW (ref 39.0–52.0)
Hemoglobin: 7.9 g/dL — ABNORMAL LOW (ref 13.0–17.0)
MCH: 28.7 pg (ref 26.0–34.0)
MCHC: 32.4 g/dL (ref 30.0–36.0)
MCV: 88.7 fL (ref 80.0–100.0)
Platelets: 154 10*3/uL (ref 150–400)
RBC: 2.75 MIL/uL — ABNORMAL LOW (ref 4.22–5.81)
RDW: 16.7 % — ABNORMAL HIGH (ref 11.5–15.5)
WBC: 8.4 10*3/uL (ref 4.0–10.5)
nRBC: 0.2 % (ref 0.0–0.2)

## 2020-03-08 LAB — PROTIME-INR
INR: 3.8 — ABNORMAL HIGH (ref 0.8–1.2)
Prothrombin Time: 36.1 seconds — ABNORMAL HIGH (ref 11.4–15.2)

## 2020-03-08 LAB — RENAL FUNCTION PANEL
Albumin: 1.7 g/dL — ABNORMAL LOW (ref 3.5–5.0)
Albumin: 1.8 g/dL — ABNORMAL LOW (ref 3.5–5.0)
Anion gap: 10 (ref 5–15)
Anion gap: 10 (ref 5–15)
BUN: 15 mg/dL (ref 6–20)
BUN: 14 mg/dL (ref 6–20)
CO2: 27 mmol/L (ref 22–32)
CO2: 26 mmol/L (ref 22–32)
Calcium: 7.8 mg/dL — ABNORMAL LOW (ref 8.9–10.3)
Calcium: 8.1 mg/dL — ABNORMAL LOW (ref 8.9–10.3)
Chloride: 99 mmol/L (ref 98–111)
Chloride: 102 mmol/L (ref 98–111)
Creatinine, Ser: 6 mg/dL — ABNORMAL HIGH (ref 0.61–1.24)
Creatinine, Ser: 4.75 mg/dL — ABNORMAL HIGH (ref 0.61–1.24)
GFR, Estimated: 11 mL/min — ABNORMAL LOW (ref >60)
GFR, Estimated: 15 mL/min — ABNORMAL LOW (ref >60)
Glucose, Bld: 149 mg/dL — ABNORMAL HIGH (ref 70–99)
Glucose, Bld: 126 mg/dL — ABNORMAL HIGH (ref 70–99)
Phosphorus: 3.3 mg/dL (ref 2.5–4.6)
Phosphorus: 2.8 mg/dL (ref 2.5–4.6)
Potassium: 3.3 mmol/L — ABNORMAL LOW (ref 3.5–5.1)
Potassium: 4 mmol/L (ref 3.5–5.1)
Sodium: 136 mmol/L (ref 135–145)
Sodium: 138 mmol/L (ref 135–145)

## 2020-03-08 LAB — GLUCOSE, CAPILLARY
Glucose-Capillary: 128 mg/dL — ABNORMAL HIGH (ref 70–99)
Glucose-Capillary: 115 mg/dL — ABNORMAL HIGH (ref 70–99)
Glucose-Capillary: 124 mg/dL — ABNORMAL HIGH (ref 70–99)
Glucose-Capillary: 115 mg/dL — ABNORMAL HIGH (ref 70–99)
Glucose-Capillary: 117 mg/dL — ABNORMAL HIGH (ref 70–99)
Glucose-Capillary: 82 mg/dL (ref 70–99)
Glucose-Capillary: 94 mg/dL (ref 70–99)
Glucose-Capillary: 101 mg/dL — ABNORMAL HIGH (ref 70–99)

## 2020-03-08 LAB — APTT: aPTT: 74 seconds — ABNORMAL HIGH (ref 24–36)

## 2020-03-08 LAB — MAGNESIUM
Magnesium: 1.9 mg/dL (ref 1.7–2.4)
Magnesium: 2.2 mg/dL (ref 1.7–2.4)

## 2020-03-08 LAB — TRIGLYCERIDES: Triglycerides: 149 mg/dL (ref <150)

## 2020-03-08 IMAGING — DX DG CHEST 1V PORT
1 series · 1 of 1 positions shown · non-contrast
Comparison: Portable chest [DATE] and earlier.

CLINICAL DATA: 44-year-old male cardiac arrest.  Intubated.

EXAM:
PORTABLE CHEST 1 VIEW

[chest]
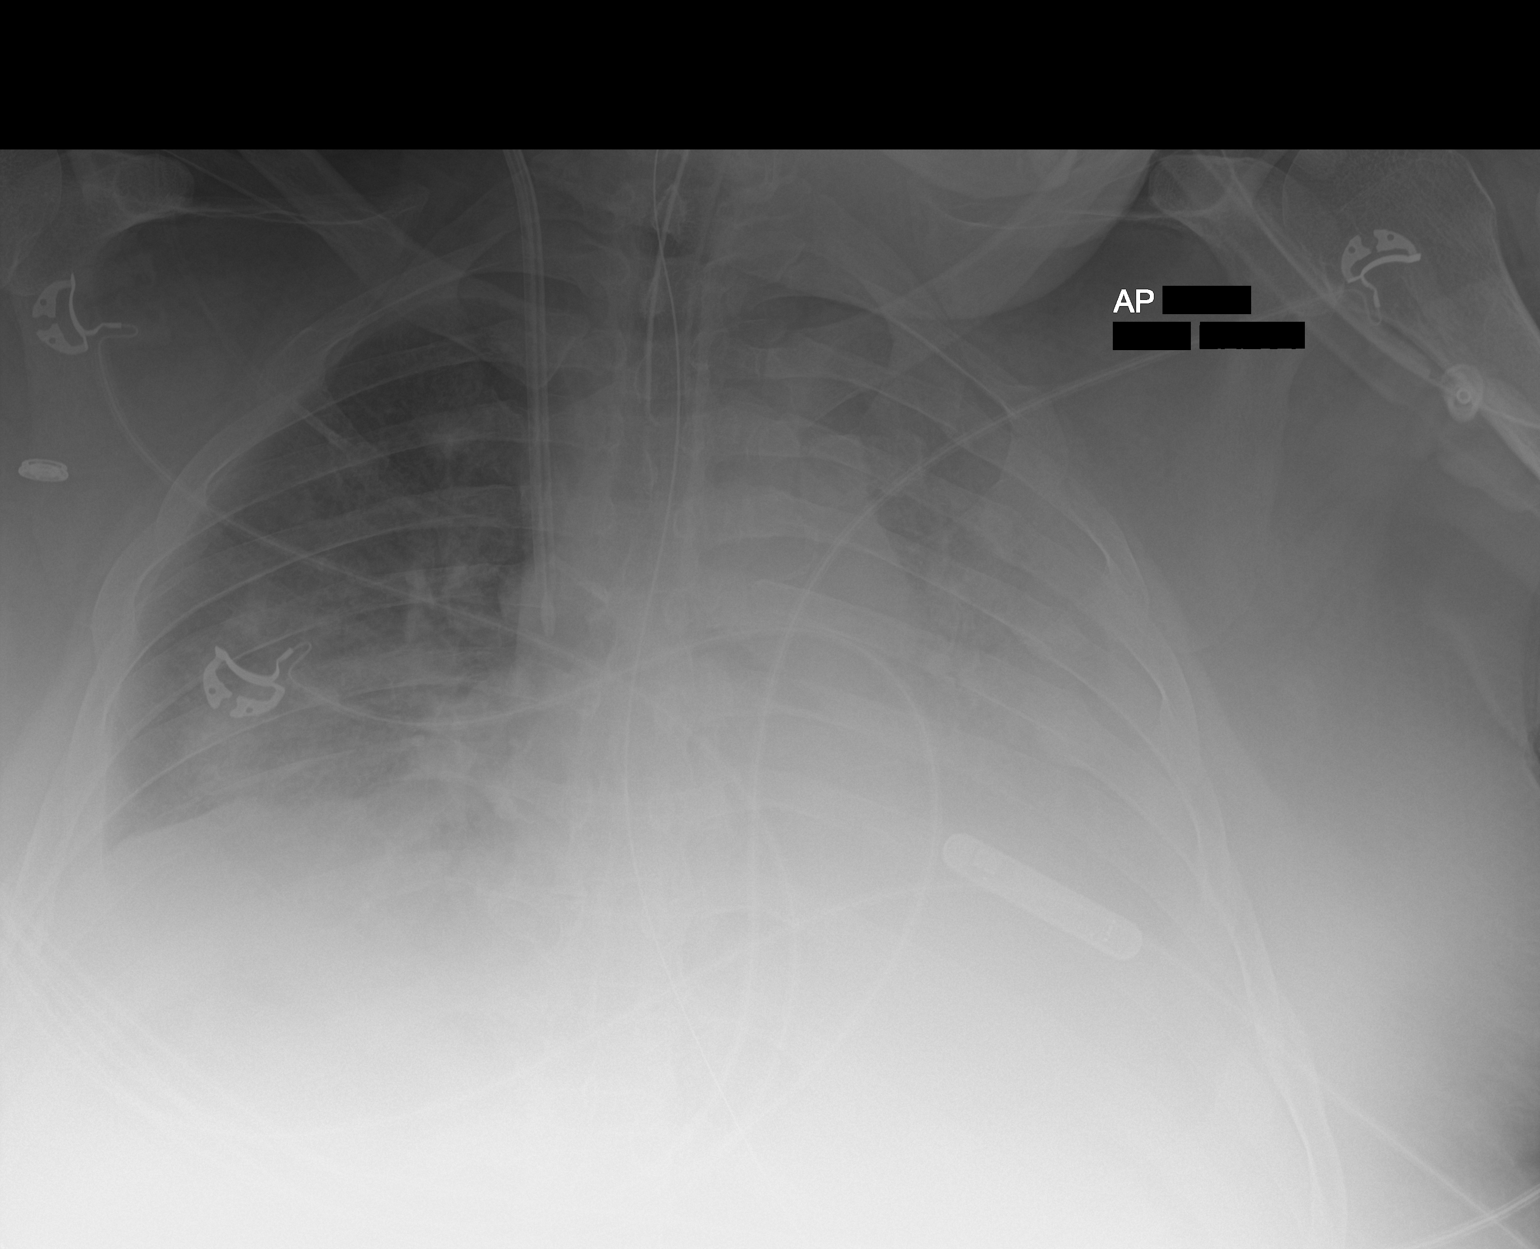

[1 of 1 positions shown; findings below may reference images not displayed]

FINDINGS: Portable AP semi upright view at [DN] hours. Endotracheal tube tip
in good position at the level the clavicles. Enteric tube courses to
the abdomen, tip not included. Stable right IJ dual-lumen catheter.
There is a superficial left chest wall defibrillator or loop
recorder.

Mildly lower lung volumes since yesterday and increased now
widespread left lung opacity. Patchy multifocal perihilar right lung
opacity is not significantly changed. Left hemidiaphragm and left
heart border now secured. No pneumothorax identified. There is
abrupt termination of gas in the left mainstem bronchus.

Chronic left posterior rib fractures. Paucity of bowel gas in the
upper abdomen.
IMPRESSION: 1.  Stable lines and tubes.
2. Increased and now severe opacity throughout much of the left
lung.
This could reflect worsening pneumonia or Mucous Plugging with
atelectasis - and favor the latter as there is fairly abrupt
termination of the left mainstem bronchus.
3. Right perihilar opacity which could be edema or infection is
stable.

## 2020-03-08 MED ORDER — POTASSIUM CHLORIDE 10 MEQ/50ML IV SOLN
10.0000 meq | INTRAVENOUS | Status: AC
  Administered 2020-03-08 (×4): 10 meq via INTRAVENOUS
  Filled 2020-03-08: qty 50
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
## 2020-03-08 NOTE — Procedures (Addendum)
Patient Name: Max Pittman.  MRN: [id number]  Epilepsy Attending: Lora Havens  Referring Physician/Provider: Montey Hora, PA Duration: 03/07/2020 1009 to 03/08/2020 1145  Patient history: 45yo M s/p cardiac arrest. EEG to evaluate for seizure  Level of alertness:  comatose  AEDs during EEG study: LEV, LTG, Propofol  Technical aspects: This EEG study was done with scalp electrodes positioned according to the 10-20 International system of electrode placement. Electrical activity was acquired at a sampling rate of '500Hz'$  and reviewed with a high frequency filter of '70Hz'$  and a low frequency filter of '1Hz'$ . EEG data were recorded continuously and digitally stored.   Description: EEG showed continuous generalized 5 to 6 Hz theta slowing as well as intermittent 2-'3Hz'$  delta slowing. Hyperventilation and photic stimulation were not performed.     ABNORMALITY -Continuous slow, generalized  IMPRESSION: This study is suggestive of moderate diffuse encephalopathy, nonspecific etiology. No seizures or epileptiform discharges were seen throughout the recording.  [name]

## 2020-03-08 NOTE — Plan of Care (Signed)
  Problem: Education: Goal: Knowledge of General Education information will improve Description: Including pain rating scale, medication(s)/side effects and non-pharmacologic comfort measures Outcome: Not Progressing   Problem: Health Behavior/Discharge Planning: Goal: Ability to manage health-related needs will improve Outcome: Not Progressing   Problem: Clinical Measurements: Goal: Ability to maintain clinical measurements within normal limits will improve Outcome: Progressing Goal: Will remain free from infection Outcome: Progressing Goal: Diagnostic test results will improve Outcome: Progressing Goal: Respiratory complications will improve Outcome: Progressing Goal: Cardiovascular complication will be avoided Outcome: Progressing   Problem: Activity: Goal: Risk for activity intolerance will decrease Outcome: Not Progressing   Problem: Nutrition: Goal: Adequate nutrition will be maintained Outcome: Progressing   Problem: Coping: Goal: Level of anxiety will decrease Outcome: Progressing   Problem: Elimination: Goal: Will not experience complications related to bowel motility Outcome: Progressing Goal: Will not experience complications related to urinary retention Outcome: Progressing   Problem: Pain Managment: Goal: General experience of comfort will improve Outcome: Progressing   Problem: Safety: Goal: Ability to remain free from injury will improve Outcome: Progressing   Problem: Skin Integrity: Goal: Risk for impaired skin integrity will decrease Outcome: Progressing   Problem: Education: Goal: Knowledge of disease and its progression will improve Outcome: Not Progressing Goal: Individualized Educational Video(s) Outcome: Not Progressing   Problem: Fluid Volume: Goal: Compliance with measures to maintain balanced fluid volume will improve Outcome: Progressing   Problem: Health Behavior/Discharge Planning: Goal: Ability to manage health-related needs  will improve Outcome: Not Progressing   Problem: Nutritional: Goal: Ability to make healthy dietary choices will improve Outcome: Not Progressing   Problem: Clinical Measurements: Goal: Complications related to the disease process, condition or treatment will be avoided or minimized Outcome: Progressing   Problem: Cardiac: Goal: Ability to achieve and maintain adequate cardiopulmonary perfusion will improve Outcome: Progressing Goal: Vascular access site(s) Level 0-1 will be maintained Outcome: Progressing   Problem: Fluid Volume: Goal: Ability to achieve a balanced intake and output will improve Outcome: Progressing   Problem: Physical Regulation: Goal: Complications related to the disease process, condition or treatment will be avoided or minimized Outcome: Progressing   Problem: Respiratory: Goal: Will regain and/or maintain adequate ventilation Outcome: Progressing

## 2020-03-08 NOTE — Progress Notes (Signed)
[facility] Progress Note  Subjective: -  1.5 L net yesterday, is requiring small dose of levo today.   Vitals:   03/08/20 0600 03/08/20 0615 03/08/20 0700 03/08/20 0726  BP: (!) 110/53 (!) 102/54 (!) 111/57   Pulse: 68 69 67   Resp:      Temp: (!) 96.6 F (35.9 C) (!) 96.4 F (35.8 C) (!) 96.4 F (35.8 C)   TempSrc: Bladder Bladder Bladder   SpO2: 100% 95% 100% 100%  Weight: (!) 154.7 kg     Height:        Exam:   on vent ,sedated  no jvd  throat ett in place  Chest cta bilat and lat  Cor reg no RG  Abd soft ntnd no ascites   GU 3+ scrotal edema   Ext diffuse severe bilat 3+ LE/ UE edema   Neuro on vent and sedated, not following commands     OP HD: pending   Assessment/ Plan:   Home meds:  - norvasc 5/ lasix 120 bid/ metoprolol 12.5 bid  - kcl 20 qd/ renvela 3 ac tid/ prilosec 40  - asa 81 / lipitor 40  - wellbutrin 150 bid/ prozac 10 qd/ percocet prn/ lyrica 50 tid  - lamotrigine 100 bid/ keppra 500 hs  - actos 15 qd  - coumadin qd  - baclofen 10 tid  - prn's/ vitamins/ supplements   OP PD: at [name]   Assessment/ Plan: 1. Acute hypoxic resp failure - after arrest w/ severe pulm edema on CXR.  2. Massive vol overload - by CXR and exam. Possibly PD is failing or pt is not doing it. Spoke w/ sig other who said he was having machine problems. Unfortunately PD will not successfully alleviate this degree of fluid overload. Cont CRRT, max UF.  3. ESRD - on CCPD at home 4. Depression - serious issue, prior to admit was struggling w/ depression issues per staff 5. SP asystolic cardiac arrest 6. HTN - holding BP meds w/ low BP  7. Hx seizures - per pmd 8. Anemia ckd - Hb 7.9, transfuse prn 9. MBD ckd - phos and Ca within range      Rob Katelynn Heidler 03/08/2020, 9:17 AM   Recent Labs  Lab 03/07/20 0625 03/07/20 1009 03/07/20 1603 03/08/20 0355  K 3.0*   < > 3.3* 3.3*  BUN  --    < > 17 15  CREATININE  --    < > 7.80* 6.00*  CALCIUM   --    < > 7.7* 7.8*  PHOS  --    < > 4.3 3.3  HGB 16.0  --   --  7.9*   < > = values in this interval not displayed.   Inpatient medications: . albuterol  2.5 mg Nebulization Q6H  . amLODipine  5 mg Per Tube Daily  . aspirin  81 mg Per Tube Daily  . chlorhexidine gluconate (MEDLINE KIT)  15 mL Mouth Rinse BID  . Chlorhexidine Gluconate Cloth  6 each Topical Daily  . docusate  100 mg Per Tube BID  . feeding supplement (PROSource TF)  45 mL Per Tube BID  . feeding supplement (VITAL HIGH PROTEIN)  1,000 mL Per Tube Q24H  . insulin aspart  0-20 Units Subcutaneous Q4H  . lamoTRIgine  100 mg Per Tube BID  . mouth rinse  15 mL Mouth Rinse 10 times per day  . pantoprazole (PROTONIX) IV  40 mg Intravenous QHS  . polyethylene  glycol  17 g Per Tube Daily  . sodium chloride flush  10-40 mL Intracatheter Q12H   .  prismasol BGK 4/2.5 400 mL/hr at 03/08/20 0421  .  prismasol BGK 4/2.5 200 mL/hr at 03/07/20 1523  . ampicillin-sulbactam (UNASYN) IV 200 mL/hr at 03/08/20 0800  . fentaNYL infusion INTRAVENOUS 250 mcg/hr (03/08/20 0800)  . levETIRAcetam Stopped (03/07/20 1035)  . norepinephrine (LEVOPHED) Adult infusion 4 mcg/min (03/08/20 0800)  . prismasol BGK 4/2.5 1,800 mL/hr at 03/08/20 0844  . propofol (DIPRIVAN) infusion 30 mcg/kg/min (03/08/20 0800)   alteplase, fentaNYL (SUBLIMAZE) injection, fentaNYL (SUBLIMAZE) injection, heparin, labetalol, midazolam, midazolam, sodium chloride, sodium chloride flush

## 2020-03-08 NOTE — Progress Notes (Signed)
[place] for IV heparin when INR < 2 Indication: Hx PE, Chronic DVT  Allergies  Allergen Reactions  . Doxycycline Hives  . Latex Swelling    Pt reports swelling at site.   . Morphine And Related Anxiety    Patient Measurements: Height: '5\' 9"'$  (175.3 cm) Weight: (!) 154.7 kg (340 lb 15.4 oz) IBW/kg (Calculated) : 70.7 Heparin Dosing Weight: 95 kg  Vital Signs: Temp: 97.2 F (36.2 C) (02/11 0900) Temp Source: Bladder (02/11 0700) BP: 120/62 (02/11 0900) Pulse Rate: 74 (02/11 0900)  Labs: Recent Labs    03/06/20 1020 03/07/20 0239 03/07/20 0239 03/07/20 0344 03/07/20 0625 03/07/20 1009 03/07/20 1603 03/08/20 0355  HGB  --  9.2*   < > 8.5* 16.0  --   --  7.9*  HCT  --  29.9*   < > 25.0* 47.0  --   --  24.4*  PLT  --  209  --   --   --   --   --  154  APTT  --   --   --   --   --  65*  --  74*  LABPROT  --  36.5*  --   --   --   --   --  36.1*  INR 3.7* 3.8*  --   --   --   --   --  3.8*  CREATININE  --  7.76*   < >  --   --  7.75* 7.80* 6.00*  TROPONINIHS  --  13  --   --   --  19*  --   --    < > = values in this interval not displayed.    Estimated Creatinine Clearance: 23.4 mL/min (A) (by C-G formula based on SCr of 6 mg/dL (H)).   Medical History: Past Medical History:  Diagnosis Date  . Arthritis   . Closed dislocation of left hip ([place]) 05/21/2019  . Diabetes ([place])   . Diabetes mellitus without complication ([place])   . DVT (deep venous thrombosis) ([place])   . History of peritoneal dialysis   . Hypertension   . Renal disorder    FFGS  . Renal disorder   . Sleep apnea   . Stroke [facility])    when ha was a child  . Vasovagal syndrome    with syncope    Medications:  Infusions:  .  prismasol BGK 4/2.5 400 mL/hr at 03/08/20 0421  .  prismasol BGK 4/2.5 200 mL/hr at 03/07/20 1523  . ampicillin-sulbactam (UNASYN) IV 200 mL/hr at 03/08/20 0934  . fentaNYL infusion INTRAVENOUS 250 mcg/hr (03/08/20 0934)  .  levETIRAcetam 500 mg (03/08/20 0934)  . norepinephrine (LEVOPHED) Adult infusion 3 mcg/min (03/08/20 0934)  . prismasol BGK 4/2.5 1,800 mL/hr at 03/08/20 0844  . propofol (DIPRIVAN) infusion 30 mcg/kg/min (03/08/20 0934)    Assessment: 45 yo male with ESRD on PD.  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  INR elevated at admission (3.8).  No overt bleeding or complications noted.    Hgb 7.9, but fairly stable, Pltc 154.  No overt bleeding or complications noted.  Pharmacy asked to start IV heparin once INR < 2.  Goal of Therapy:  Heparin level 0.3-0.7 units/ml Monitor platelets by anticoagulation protocol: Yes   Plan:  Continue holding heparin for now. F/u AM INR.  Start heparin when INR < 2.  [place] Crane, Vena Austria, BCPS, [facility] Clinical Pharmacist  03/08/2020 10:52 AM   [place] pharmacy phone numbers are listed on [email or web address]

## 2020-03-08 NOTE — Progress Notes (Signed)
vLTM EEG complete. No skin breakodwn

## 2020-03-09 ENCOUNTER — Inpatient Hospital Stay (HOSPITAL_COMMUNITY): Payer: Medicare Other

## 2020-03-09 DIAGNOSIS — Z9911 Dependence on respirator [ventilator] status: Secondary | ICD-10-CM | POA: Diagnosis not present

## 2020-03-09 DIAGNOSIS — J9601 Acute respiratory failure with hypoxia: Secondary | ICD-10-CM | POA: Diagnosis not present

## 2020-03-09 DIAGNOSIS — I469 Cardiac arrest, cause unspecified: Secondary | ICD-10-CM | POA: Diagnosis not present

## 2020-03-09 DIAGNOSIS — J810 Acute pulmonary edema: Secondary | ICD-10-CM | POA: Diagnosis not present

## 2020-03-09 LAB — RENAL FUNCTION PANEL
Albumin: 1.8 g/dL — ABNORMAL LOW (ref 3.5–5.0)
Albumin: 1.7 g/dL — ABNORMAL LOW (ref 3.5–5.0)
Anion gap: 7 (ref 5–15)
Anion gap: 6 (ref 5–15)
BUN: 12 mg/dL (ref 6–20)
BUN: 13 mg/dL (ref 6–20)
CO2: 27 mmol/L (ref 22–32)
CO2: 26 mmol/L (ref 22–32)
Calcium: 8.1 mg/dL — ABNORMAL LOW (ref 8.9–10.3)
Calcium: 8.1 mg/dL — ABNORMAL LOW (ref 8.9–10.3)
Chloride: 102 mmol/L (ref 98–111)
Chloride: 102 mmol/L (ref 98–111)
Creatinine, Ser: 3.88 mg/dL — ABNORMAL HIGH (ref 0.61–1.24)
Creatinine, Ser: 3.03 mg/dL — ABNORMAL HIGH (ref 0.61–1.24)
GFR, Estimated: 19 mL/min — ABNORMAL LOW (ref >60)
GFR, Estimated: 25 mL/min — ABNORMAL LOW (ref >60)
Glucose, Bld: 137 mg/dL — ABNORMAL HIGH (ref 70–99)
Glucose, Bld: 117 mg/dL — ABNORMAL HIGH (ref 70–99)
Phosphorus: 2.6 mg/dL (ref 2.5–4.6)
Phosphorus: 1.7 mg/dL — ABNORMAL LOW (ref 2.5–4.6)
Potassium: 3.9 mmol/L (ref 3.5–5.1)
Potassium: 3.7 mmol/L (ref 3.5–5.1)
Sodium: 136 mmol/L (ref 135–145)
Sodium: 134 mmol/L — ABNORMAL LOW (ref 135–145)

## 2020-03-09 LAB — CBC
HCT: 28 % — ABNORMAL LOW (ref 39.0–52.0)
Hemoglobin: 8.4 g/dL — ABNORMAL LOW (ref 13.0–17.0)
MCH: 27.5 pg (ref 26.0–34.0)
MCHC: 30 g/dL (ref 30.0–36.0)
MCV: 91.5 fL (ref 80.0–100.0)
Platelets: 174 10*3/uL (ref 150–400)
RBC: 3.06 MIL/uL — ABNORMAL LOW (ref 4.22–5.81)
RDW: 17.2 % — ABNORMAL HIGH (ref 11.5–15.5)
WBC: 8.8 10*3/uL (ref 4.0–10.5)
nRBC: 0.7 % — ABNORMAL HIGH (ref 0.0–0.2)

## 2020-03-09 LAB — POCT I-STAT 7, (LYTES, BLD GAS, ICA,H+H)
Acid-Base Excess: 1 mmol/L (ref 0.0–2.0)
Bicarbonate: 27.2 mmol/L (ref 20.0–28.0)
Calcium, Ion: 1.19 mmol/L (ref 1.15–1.40)
HCT: 23 % — ABNORMAL LOW (ref 39.0–52.0)
Hemoglobin: 7.8 g/dL — ABNORMAL LOW (ref 13.0–17.0)
O2 Saturation: 90 %
Patient temperature: 36.8
Potassium: 3.8 mmol/L (ref 3.5–5.1)
Sodium: 138 mmol/L (ref 135–145)
TCO2: 29 mmol/L (ref 22–32)
pCO2 arterial: 50.3 mmHg — ABNORMAL HIGH (ref 32.0–48.0)
pH, Arterial: 7.339 — ABNORMAL LOW (ref 7.350–7.450)
pO2, Arterial: 62 mmHg — ABNORMAL LOW (ref 83.0–108.0)

## 2020-03-09 LAB — HEPARIN LEVEL (UNFRACTIONATED): Heparin Unfractionated: 0.16 IU/mL — ABNORMAL LOW (ref 0.30–0.70)

## 2020-03-09 LAB — CULTURE, RESPIRATORY W GRAM STAIN
Culture: NORMAL
Gram Stain: NONE SEEN

## 2020-03-09 LAB — APTT: aPTT: 50 seconds — ABNORMAL HIGH (ref 24–36)

## 2020-03-09 LAB — GLUCOSE, CAPILLARY
Glucose-Capillary: 124 mg/dL — ABNORMAL HIGH (ref 70–99)
Glucose-Capillary: 92 mg/dL (ref 70–99)
Glucose-Capillary: 101 mg/dL — ABNORMAL HIGH (ref 70–99)
Glucose-Capillary: 126 mg/dL — ABNORMAL HIGH (ref 70–99)
Glucose-Capillary: 123 mg/dL — ABNORMAL HIGH (ref 70–99)

## 2020-03-09 LAB — MAGNESIUM: Magnesium: 2.1 mg/dL (ref 1.7–2.4)

## 2020-03-09 LAB — PROTIME-INR
INR: 1.9 — ABNORMAL HIGH (ref 0.8–1.2)
Prothrombin Time: 21.4 seconds — ABNORMAL HIGH (ref 11.4–15.2)

## 2020-03-09 IMAGING — DX DG CHEST 1V PORT
1 series · 1 of 1 positions shown · non-contrast
Comparison: [DATE].

CLINICAL DATA: Hypoxia

EXAM:
PORTABLE CHEST 1 VIEW

[chest ap]
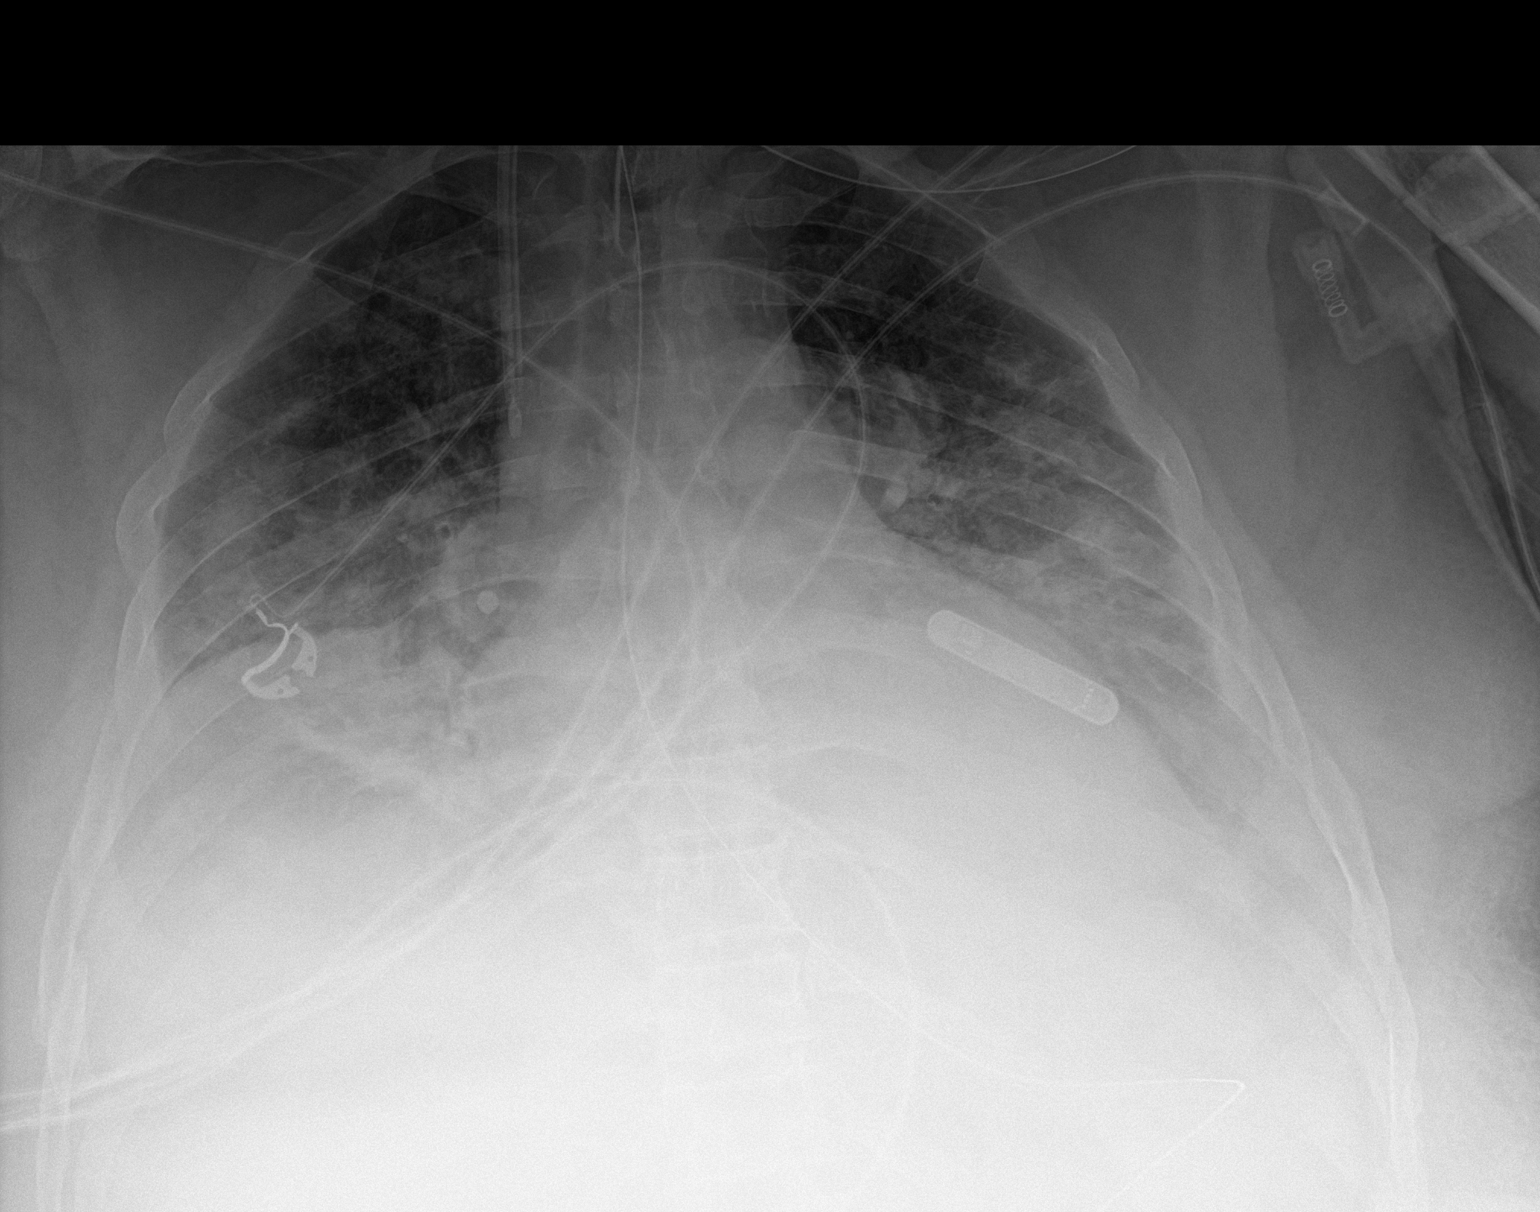

[1 of 1 positions shown; findings below may reference images not displayed]

FINDINGS: Endotracheal tube tip is 5.7 cm above the carina. Nasogastric tube
tip and side port are below the diaphragm. Central catheter tip is
in the superior vena cava. No pneumothorax. There is a left pleural
effusion. There is ill-defined airspace opacity in each mid and
lower lung region. There is cardiomegaly with a degree of pulmonary
venous hypertension. The abrupt termination of the left main
bronchus seen 1 day prior is not appreciable on this examination.
There is an apparent loop recorder on the left. No evident
adenopathy. No bone lesions.
IMPRESSION: Tube and catheter positions as described without pneumothorax.
Cardiomegaly with pulmonary vascular congestion. Left pleural
effusion. Areas of airspace opacity bilaterally may represent
pulmonary edema or multifocal pneumonia. Both entities may be
present concurrently.

## 2020-03-09 MED ORDER — ALBUTEROL SULFATE (2.5 MG/3ML) 0.083% IN NEBU
2.5000 mg | INHALATION_SOLUTION | Freq: Three times a day (TID) | RESPIRATORY_TRACT | Status: DC
  Administered 2020-03-09 – 2020-03-13 (×13): 2.5 mg via RESPIRATORY_TRACT
  Filled 2020-03-09 (×13): qty 3

## 2020-03-09 MED ORDER — HEPARIN (PORCINE) 25000 UT/250ML-% IV SOLN
2500.0000 [IU]/h | INTRAVENOUS | Status: DC
  Administered 2020-03-09: 1400 [IU]/h via INTRAVENOUS
  Administered 2020-03-09: 1700 [IU]/h via INTRAVENOUS
  Administered 2020-03-10 – 2020-03-11 (×4): 2000 [IU]/h via INTRAVENOUS
  Administered 2020-03-12: 2200 [IU]/h via INTRAVENOUS
  Filled 2020-03-09 (×8): qty 250

## 2020-03-09 NOTE — Progress Notes (Signed)
[place] for IV heparin Indication: Hx PE, Chronic DVT  Allergies  Allergen Reactions  . Doxycycline Hives  . Latex Swelling    Pt reports swelling at site.   . Morphine And Related Anxiety    Patient Measurements: Height: '5\' 9"'$  (175.3 cm) Weight: (!) 145.3 kg (320 lb 3.8 oz) IBW/kg (Calculated) : 70.7 Heparin Dosing Weight: 95 kg  Vital Signs: Temp: 97.9 F (36.6 C) (02/12 0800) Temp Source: Axillary (02/12 0800) BP: 93/49 (02/12 1500) Pulse Rate: 84 (02/12 1500)  Labs: Recent Labs    03/07/20 0239 03/07/20 0344 03/07/20 1009 03/07/20 1603 03/08/20 0355 03/08/20 1630 03/09/20 0331 03/09/20 0401 03/09/20 0420 03/09/20 1426  HGB 9.2*   < >  --   --  7.9*  --  7.8*  --  8.4*  --   HCT 29.9*   < >  --   --  24.4*  --  23.0*  --  28.0*  --   PLT 209  --   --   --  154  --   --   --  174  --   APTT  --   --  65*  --  74*  --   --  50*  --   --   LABPROT 36.5*  --   --   --  36.1*  --   --  21.4*  --   --   INR 3.8*  --   --   --  3.8*  --   --  1.9*  --   --   HEPARINUNFRC  --   --   --   --   --   --   --   --   --  0.16*  CREATININE 7.76*  --  7.75*   < > 6.00* 4.75*  --  3.88*  --   --   TROPONINIHS 13  --  19*  --   --   --   --   --   --   --    < > = values in this interval not displayed.    Estimated Creatinine Clearance: 34.9 mL/min (A) (by C-G formula based on SCr of 3.88 mg/dL (H)).    Assessment: 45 yo male with ESRD on PD.  On chronic Coumadin for history of PE and chronic DVTs.  INR elevated at admission (3.8) and has trended down to < 2.  Pharmacy consulted to manage IV heparin.  Heparin level is sub-therapeutic at 0.16 units/mL.  No issue with heparin infusion nor bleeding per RN.  Goal of Therapy:  Heparin level 0.3-0.7 units/ml Monitor platelets by anticoagulation protocol: Yes   Plan:  Increase heparin gtt to 1700 units/hr - no bolus with INR 1.9 Check 6 hr heparin level  Meleana Commerford D. [name], PharmD,  BCPS, [place] 03/09/2020, 3:59 PM

## 2020-03-09 NOTE — Progress Notes (Signed)
Pt placed on full vent support due to increased WOB. RN aware.

## 2020-03-09 NOTE — Progress Notes (Signed)
Pt placed on PSV 10/5 by Dr Carlis Abbott. Pt is tolerating well at this time.

## 2020-03-09 NOTE — Progress Notes (Signed)
[place] for IV heparin when INR < 2 Indication: Hx PE, Chronic DVT  Allergies  Allergen Reactions  . Doxycycline Hives  . Latex Swelling    Pt reports swelling at site.   . Morphine And Related Anxiety    Patient Measurements: Height: '5\' 9"'$  (175.3 cm) Weight: (!) 154.7 kg (340 lb 15.4 oz) IBW/kg (Calculated) : 70.7 Heparin Dosing Weight: 95 kg  Vital Signs: Temp: 98.6 F (37 C) (02/12 0500) Temp Source: Bladder (02/12 0500) BP: 101/59 (02/12 0400) Pulse Rate: 81 (02/12 0335)  Labs: Recent Labs    03/07/20 0239 03/07/20 0344 03/07/20 0625 03/07/20 1009 03/07/20 1603 03/08/20 0355 03/08/20 1630 03/09/20 0331 03/09/20 0401  HGB 9.2*   < > 16.0  --   --  7.9*  --  7.8*  --   HCT 29.9*   < > 47.0  --   --  24.4*  --  23.0*  --   PLT 209  --   --   --   --  154  --   --   --   APTT  --   --   --  65*  --  74*  --   --  50*  LABPROT 36.5*  --   --   --   --  36.1*  --   --  21.4*  INR 3.8*  --   --   --   --  3.8*  --   --  1.9*  CREATININE 7.76*  --   --  7.75*   < > 6.00* 4.75*  --  3.88*  TROPONINIHS 13  --   --  19*  --   --   --   --   --    < > = values in this interval not displayed.    Estimated Creatinine Clearance: 36.2 mL/min (A) (by C-G formula based on SCr of 3.88 mg/dL (H)).   Medical History: Past Medical History:  Diagnosis Date  . Arthritis   . Closed dislocation of left hip ([place]) 05/21/2019  . Diabetes ([place])   . Diabetes mellitus without complication ([place])   . DVT (deep venous thrombosis) ([place])   . History of peritoneal dialysis   . Hypertension   . Renal disorder    FFGS  . Renal disorder   . Sleep apnea   . Stroke [facility])    when ha was a child  . Vasovagal syndrome    with syncope    Medications:  Infusions:  .  prismasol BGK 4/2.5 400 mL/hr at 03/08/20 1707  .  prismasol BGK 4/2.5 200 mL/hr at 03/08/20 1958  . ampicillin-sulbactam (UNASYN) IV Stopped (03/08/20 2157)  . fentaNYL infusion  INTRAVENOUS 250 mcg/hr (03/09/20 0500)  . heparin    . levETIRAcetam Stopped (03/08/20 0949)  . norepinephrine (LEVOPHED) Adult infusion 3 mcg/min (03/09/20 0500)  . prismasol BGK 4/2.5 1,800 mL/hr at 03/09/20 0428  . propofol (DIPRIVAN) infusion 20 mcg/kg/min (03/09/20 0500)    Assessment: 45 yo male with ESRD on PD.  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  INR elevated at admission (3.8).  No overt bleeding or complications noted.    Hgb 7.9, but fairly stable, Pltc 154.  No overt bleeding or complications noted.  Pharmacy asked to start IV heparin once INR < 2.  2/12 AM update:  INR now <2, will start heparin Hgb low, but stable from yesterday  Goal of Therapy:  Heparin level 0.3-0.7 units/ml Monitor platelets by anticoagulation protocol: Yes   Plan:  Start heparin drip at 1400 units/hr 1400 heparin level  Narda Bonds, PharmD, BCPS Clinical Pharmacist Phone: [phone]
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
## 2020-03-09 NOTE — Progress Notes (Signed)
[facility] Progress Note  Subjective: low dose levo, net negative 5.1 L yest, wt's down 145kg (158 on admit). Labs okay  Vitals:   03/09/20 1100 03/09/20 1117 03/09/20 1200 03/09/20 1238  BP: (!) 110/57 (!) 115/59 (!) 86/66   Pulse:  77 79 92  Resp:  20  13  Temp:      TempSrc:      SpO2:  100% 92% 92%  Weight:      Height:        Exam:   on vent ,sedated  no jvd  throat ett in place  Chest cta bilat and lat  Cor reg no RG  Abd soft ntnd no ascites   GU 3+ scrotal edema   Ext diffuse severe bilat 2- 3+ LE/ UE edema, slight improvement   Neuro on vent and sedated, not following commands     OP HD: pending   Assessment/ Plan:   Home meds:  - norvasc 5/ lasix 120 bid/ metoprolol 12.5 bid  - kcl 20 qd/ renvela 3 ac tid/ prilosec 40  - asa 81 / lipitor 40  - wellbutrin 150 bid/ prozac 10 qd/ percocet prn/ lyrica 50 tid  - lamotrigine 100 bid/ keppra 500 hs  - actos 15 qd  - coumadin qd  - baclofen 10 tid  - prn's/ vitamins/ supplements   OP PD: at [name]   Assessment/ Plan: 1. Acute hypoxic resp failure w/ cardiac arrest - severe pulm edema by CXR and severe vol overload on exam.  2. Massive vol overload - by CXR and exam. Either PD is failing or pt is not doing it. UF failure may be possible, B/Cr on admission were not real high for esrd. Vol overload starting to improve, was net negative 5 L yest. Cont max UF on CRRT.  3. ESRD - on CCPD at home. Getting CRRT here started 2/10. Continue.  4. Depression - recurrent issue 5. SP asystolic cardiac arrest 6. Hypotension - holding home BP meds, on lowdose levo 7. Hx seizures - per pmd 8. 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 albuterol  2.5 mg Nebulization TID  . aspirin  81 mg Per Tube Daily  . chlorhexidine gluconate (MEDLINE KIT)  15 mL Mouth Rinse BID  . Chlorhexidine Gluconate Cloth  6 each Topical Daily  . docusate  100 mg Per Tube BID  . feeding supplement (PROSource TF)  45 mL Per Tube BID  . feeding supplement (VITAL HIGH PROTEIN)  1,000 mL Per Tube Q24H  . insulin aspart  0-20 Units Subcutaneous Q4H  . lamoTRIgine  100 mg Per Tube BID  . mouth rinse  15 mL Mouth Rinse 10 times per day  . pantoprazole (PROTONIX) IV  40 mg Intravenous QHS  . polyethylene glycol  17 g Per Tube Daily  . sodium chloride flush  10-40 mL Intracatheter Q12H   .  prismasol BGK 4/2.5 400 mL/hr at  03/09/20 0602  .  prismasol BGK 4/2.5 200 mL/hr at 03/08/20 1958  . ampicillin-sulbactam (UNASYN) IV 3 g (03/09/20 1422)  . fentaNYL infusion INTRAVENOUS Stopped (03/09/20 1252)  . heparin 1,400 Units/hr (03/09/20 1300)  . levETIRAcetam Stopped (03/09/20 0925)  . norepinephrine (LEVOPHED) Adult infusion 1 mcg/min (03/09/20 1300)  . prismasol BGK 4/2.5 1,800 mL/hr at 03/09/20 1334  . propofol (DIPRIVAN) infusion Stopped (03/09/20 1215)   alteplase, fentaNYL (SUBLIMAZE) injection, fentaNYL (SUBLIMAZE) injection, heparin, labetalol, midazolam, midazolam, sodium chloride, sodium chloride flush

## 2020-03-10 DIAGNOSIS — N186 End stage renal disease: Secondary | ICD-10-CM | POA: Diagnosis not present

## 2020-03-10 DIAGNOSIS — G931 Anoxic brain damage, not elsewhere classified: Secondary | ICD-10-CM

## 2020-03-10 DIAGNOSIS — Z9911 Dependence on respirator [ventilator] status: Secondary | ICD-10-CM | POA: Diagnosis not present

## 2020-03-10 DIAGNOSIS — J9601 Acute respiratory failure with hypoxia: Secondary | ICD-10-CM | POA: Diagnosis not present

## 2020-03-10 DIAGNOSIS — J810 Acute pulmonary edema: Secondary | ICD-10-CM | POA: Diagnosis not present

## 2020-03-10 LAB — POCT I-STAT 7, (LYTES, BLD GAS, ICA,H+H)
Acid-Base Excess: 1 mmol/L (ref 0.0–2.0)
Acid-Base Excess: 3 mmol/L — ABNORMAL HIGH (ref 0.0–2.0)
Bicarbonate: 27.3 mmol/L (ref 20.0–28.0)
Bicarbonate: 29.2 mmol/L — ABNORMAL HIGH (ref 20.0–28.0)
Calcium, Ion: 1.24 mmol/L (ref 1.15–1.40)
Calcium, Ion: 1.26 mmol/L (ref 1.15–1.40)
HCT: 25 % — ABNORMAL LOW (ref 39.0–52.0)
HCT: 26 % — ABNORMAL LOW (ref 39.0–52.0)
Hemoglobin: 8.5 g/dL — ABNORMAL LOW (ref 13.0–17.0)
Hemoglobin: 8.8 g/dL — ABNORMAL LOW (ref 13.0–17.0)
O2 Saturation: 95 %
O2 Saturation: 98 %
Patient temperature: 96.5
Patient temperature: 97
Potassium: 3.9 mmol/L (ref 3.5–5.1)
Potassium: 4 mmol/L (ref 3.5–5.1)
Sodium: 137 mmol/L (ref 135–145)
Sodium: 137 mmol/L (ref 135–145)
TCO2: 29 mmol/L (ref 22–32)
TCO2: 31 mmol/L (ref 22–32)
pCO2 arterial: 50.2 mmHg — ABNORMAL HIGH (ref 32.0–48.0)
pCO2 arterial: 53.2 mmHg — ABNORMAL HIGH (ref 32.0–48.0)
pH, Arterial: 7.34 — ABNORMAL LOW (ref 7.350–7.450)
pH, Arterial: 7.342 — ABNORMAL LOW (ref 7.350–7.450)
pO2, Arterial: 101 mmHg (ref 83.0–108.0)
pO2, Arterial: 77 mmHg — ABNORMAL LOW (ref 83.0–108.0)

## 2020-03-10 LAB — RENAL FUNCTION PANEL
Albumin: 1.6 g/dL — ABNORMAL LOW (ref 3.5–5.0)
Albumin: 1.7 g/dL — ABNORMAL LOW (ref 3.5–5.0)
Anion gap: 9 (ref 5–15)
Anion gap: 17 — ABNORMAL HIGH (ref 5–15)
BUN: 15 mg/dL (ref 6–20)
BUN: 14 mg/dL (ref 6–20)
CO2: 26 mmol/L (ref 22–32)
CO2: 24 mmol/L (ref 22–32)
Calcium: 8.1 mg/dL — ABNORMAL LOW (ref 8.9–10.3)
Calcium: 8.4 mg/dL — ABNORMAL LOW (ref 8.9–10.3)
Chloride: 101 mmol/L (ref 98–111)
Chloride: 101 mmol/L (ref 98–111)
Creatinine, Ser: 2.79 mg/dL — ABNORMAL HIGH (ref 0.61–1.24)
Creatinine, Ser: 2.39 mg/dL — ABNORMAL HIGH (ref 0.61–1.24)
GFR, Estimated: 28 mL/min — ABNORMAL LOW (ref >60)
GFR, Estimated: 34 mL/min — ABNORMAL LOW (ref >60)
Glucose, Bld: 132 mg/dL — ABNORMAL HIGH (ref 70–99)
Glucose, Bld: 158 mg/dL — ABNORMAL HIGH (ref 70–99)
Phosphorus: 2 mg/dL — ABNORMAL LOW (ref 2.5–4.6)
Phosphorus: 2.1 mg/dL — ABNORMAL LOW (ref 2.5–4.6)
Potassium: 4 mmol/L (ref 3.5–5.1)
Potassium: 4.5 mmol/L (ref 3.5–5.1)
Sodium: 136 mmol/L (ref 135–145)
Sodium: 142 mmol/L (ref 135–145)

## 2020-03-10 LAB — HEPARIN LEVEL (UNFRACTIONATED)
Heparin Unfractionated: 0.22 IU/mL — ABNORMAL LOW (ref 0.30–0.70)
Heparin Unfractionated: 0.33 IU/mL (ref 0.30–0.70)
Heparin Unfractionated: >2.2 IU/mL — ABNORMAL HIGH (ref 0.30–0.70)

## 2020-03-10 LAB — APTT
aPTT: >200 seconds (ref 24–36)
aPTT: 116 seconds — ABNORMAL HIGH (ref 24–36)

## 2020-03-10 LAB — CBC
HCT: 24.2 % — ABNORMAL LOW (ref 39.0–52.0)
Hemoglobin: 7.7 g/dL — ABNORMAL LOW (ref 13.0–17.0)
MCH: 28.7 pg (ref 26.0–34.0)
MCHC: 31.8 g/dL (ref 30.0–36.0)
MCV: 90.3 fL (ref 80.0–100.0)
Platelets: 148 10*3/uL — ABNORMAL LOW (ref 150–400)
RBC: 2.68 MIL/uL — ABNORMAL LOW (ref 4.22–5.81)
RDW: 17.5 % — ABNORMAL HIGH (ref 11.5–15.5)
WBC: 5.6 10*3/uL (ref 4.0–10.5)
nRBC: 0.9 % — ABNORMAL HIGH (ref 0.0–0.2)

## 2020-03-10 LAB — PROTIME-INR
INR: 2.3 — ABNORMAL HIGH (ref 0.8–1.2)
Prothrombin Time: 24.6 seconds — ABNORMAL HIGH (ref 11.4–15.2)

## 2020-03-10 LAB — GLUCOSE, CAPILLARY
Glucose-Capillary: 106 mg/dL — ABNORMAL HIGH (ref 70–99)
Glucose-Capillary: 110 mg/dL — ABNORMAL HIGH (ref 70–99)
Glucose-Capillary: 113 mg/dL — ABNORMAL HIGH (ref 70–99)
Glucose-Capillary: 114 mg/dL — ABNORMAL HIGH (ref 70–99)
Glucose-Capillary: 127 mg/dL — ABNORMAL HIGH (ref 70–99)
Glucose-Capillary: 131 mg/dL — ABNORMAL HIGH (ref 70–99)
Glucose-Capillary: 143 mg/dL — ABNORMAL HIGH (ref 70–99)
Glucose-Capillary: 65 mg/dL — ABNORMAL LOW (ref 70–99)
Glucose-Capillary: 98 mg/dL (ref 70–99)

## 2020-03-10 LAB — MAGNESIUM: Magnesium: 2.3 mg/dL (ref 1.7–2.4)

## 2020-03-10 MED ORDER — ORAL CARE MOUTH RINSE
15.0000 mL | Freq: Two times a day (BID) | OROMUCOSAL | Status: DC
  Administered 2020-03-10 – 2020-03-25 (×20): 15 mL via OROMUCOSAL

## 2020-03-10 MED ORDER — DEXMEDETOMIDINE HCL IN NACL 400 MCG/100ML IV SOLN
0.4000 ug/kg/h | INTRAVENOUS | Status: DC
  Administered 2020-03-10: 1.2 ug/kg/h via INTRAVENOUS
  Administered 2020-03-10: 0.8 ug/kg/h via INTRAVENOUS
  Administered 2020-03-10: 0.6 ug/kg/h via INTRAVENOUS
  Administered 2020-03-10: 0.8 ug/kg/h via INTRAVENOUS
  Administered 2020-03-11: 1.2 ug/kg/h via INTRAVENOUS
  Administered 2020-03-11: 0.4 ug/kg/h via INTRAVENOUS
  Administered 2020-03-11 (×2): 1.2 ug/kg/h via INTRAVENOUS
  Filled 2020-03-10: qty 100
  Filled 2020-03-10 (×3): qty 200

## 2020-03-10 NOTE — Progress Notes (Signed)
eLink Physician-Brief Progress Note Patient Name: Max Pittman. DOB: October 09, 1975 MRN: [id number]   Date of Service  03/10/2020  HPI/Events of Note  ABG : worsening pco2 at 53. Asking for BiPAP trial, still following commands and protecting airways.  eICU Interventions  BiPAP with asp precautions ordered. Prn ABG.     Intervention Category Intermediate Interventions: Respiratory distress - evaluation and management  Elmer Sow 03/10/2020, 11:54 PM

## 2020-03-10 NOTE — Progress Notes (Signed)
110 mL of fentanyl wasted in stericycle. Witnessed by Ed Blalock, RN.
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
## 2020-03-10 NOTE — Progress Notes (Signed)
[place] for heparin Indication: H/O VTE  Allergies  Allergen Reactions  . Doxycycline Hives  . Latex Swelling    Pt reports swelling at site.   . Morphine And Related Anxiety    Patient Measurements: Height: '5\' 9"'$  (175.3 cm) Weight: (!) 145.3 kg (320 lb 3.8 oz) IBW/kg (Calculated) : 70.7 Heparin Dosing Weight: 95 kg  Vital Signs: Temp: 98.1 F (36.7 C) (02/12 2200) Temp Source: Bladder (02/12 2200) BP: 102/51 (02/13 0015) Pulse Rate: 81 (02/13 0015)  Labs: Recent Labs    03/07/20 0239 03/07/20 0344 03/07/20 1009 03/07/20 1603 03/08/20 0355 03/08/20 1630 03/09/20 0331 03/09/20 0401 03/09/20 0420 03/09/20 1426 03/09/20 2010 03/09/20 2339  HGB 9.2*   < >  --   --  7.9*  --  7.8*  --  8.4*  --   --   --   HCT 29.9*   < >  --   --  24.4*  --  23.0*  --  28.0*  --   --   --   PLT 209  --   --   --  154  --   --   --  174  --   --   --   APTT  --   --  65*  --  74*  --   --  50*  --   --   --   --   LABPROT 36.5*  --   --   --  36.1*  --   --  21.4*  --   --   --   --   INR 3.8*  --   --   --  3.8*  --   --  1.9*  --   --   --   --   HEPARINUNFRC  --   --   --   --   --   --   --   --   --  0.16*  --  0.22*  CREATININE 7.76*  --  7.75*   < > 6.00* 4.75*  --  3.88*  --   --  3.03*  --   TROPONINIHS 13  --  19*  --   --   --   --   --   --   --   --   --    < > = values in this interval not displayed.    Estimated Creatinine Clearance: 44.7 mL/min (A) (by C-G formula based on SCr of 3.03 mg/dL (H)).   Assessment: 45 y.o. male with h/o PE/DVT, Coumadin on hold and INR < 2, for heparin  Goal of Therapy:  Heparin level 0.3-0.7 units/ml Monitor platelets by anticoagulation protocol: Yes   Plan:  Increase Heparin 2000 units/hr Check heparin level in 8 hours.  Phillis Knack, PharmD, BCPS  03/10/2020, 12:25 AM

## 2020-03-10 NOTE — Progress Notes (Signed)
[place] for IV heparin Indication: Hx PE, Chronic DVT  Allergies  Allergen Reactions  . Doxycycline Hives  . Latex Swelling    Pt reports swelling at site.   . Morphine And Related Anxiety    Patient Measurements: Height: '5\' 9"'$  (175.3 cm) Weight: (!) 139.3 kg (307 lb 1.6 oz) IBW/kg (Calculated) : 70.7 Heparin Dosing Weight: 95 kg  Vital Signs: Temp: 97.6 F (36.4 C) (02/13 1130) Temp Source: Oral (02/13 1130) BP: 88/65 (02/13 1130) Pulse Rate: 70 (02/13 1200)  Labs: Recent Labs    03/08/20 0355 03/08/20 1630 03/09/20 0331 03/09/20 0401 03/09/20 0420 03/09/20 1426 03/09/20 2010 03/09/20 2339 03/10/20 0420 03/10/20 0757 03/10/20 1154  HGB 7.9*  --  7.8*  --  8.4*  --   --   --  7.7*  --   --   HCT 24.4*  --  23.0*  --  28.0*  --   --   --  24.2*  --   --   PLT 154  --   --   --  174  --   --   --  148*  --   --   APTT 74*  --   --  50*  --   --   --   --   --  >200*  --   LABPROT 36.1*  --   --  21.4*  --   --   --   --   --  24.6*  --   INR 3.8*  --   --  1.9*  --   --   --   --   --  2.3*  --   HEPARINUNFRC  --   --   --   --   --    < >  --  0.22*  --  >2.20* 0.33  CREATININE 6.00*   < >  --  3.88*  --   --  3.03*  --  2.79*  --   --    < > = values in this interval not displayed.    Estimated Creatinine Clearance: 47.4 mL/min (A) (by C-G formula based on SCr of 2.79 mg/dL (H)).   Assessment: 66 yoM on warfarin PTA for hx PE and chronic DVTs admitted s/p cardiac arrest. Warfarin held, heparin started 2/12 with INR <2.  Heparin level therapeutic at 0.33, H/H low but stable. INR up to 2.3 but likely due to shock liver (last dose warfarin 2/8) so will continue heparin.   Goal of Therapy:  Heparin level 0.3-0.7 units/ml Monitor platelets by anticoagulation protocol: Yes   Plan:  Continue heparin 2000 units/h Daily heparin level and CBC - need to draw peripherally   Arrie Senate, PharmD, BCPS, [facility] Clinical  Pharmacist [phone] Please check AMION for all [facility] numbers 03/10/2020

## 2020-03-10 NOTE — Progress Notes (Signed)
Patient self extubated at [place]. RT placed patient on 10L [place] HFNC. Patient is able to speak and maintaining spo2 100%. Patient has strong cough and is able to protect his airway. RT will obtain ABG in 30 minutes.

## 2020-03-10 NOTE — Progress Notes (Signed)
eLink Physician-Brief Progress Note Patient Name: Max Pittman. DOB: 07/25/1975 MRN: [id number]   Date of Service  03/10/2020  HPI/Events of Note  Self extubated.  Camera eval: Asking for restraints. Able to protect airways, stable hemodynamically. sats good on nasal o2. Neurologically improved.  2/10: Cardiac arrest. Encephalopathy on EEG. CRRT on.   eICU Interventions  Non violent restraints ordered to prevent self harm and injury. Get ABG in 30 mins Aspiration precautions.      Intervention Category Intermediate Interventions: Respiratory distress - evaluation and management;Other: Minor Interventions: Agitation / anxiety - evaluation and management  Elmer Sow 03/10/2020, 7:56 PM

## 2020-03-10 NOTE — Progress Notes (Signed)
[facility] Progress Note  Subjective: low dose levo, net negative 5.1 L yest, wt's down 145kg (158 on admit). Labs okay  Vitals:   03/10/20 0700 03/10/20 0738 03/10/20 0742 03/10/20 0800  BP: (!) 100/53   117/61  Pulse: 76  78   Resp:   20   Temp:    (!) 97.5 F (36.4 C)  TempSrc:    Bladder  SpO2: 98% 98% 100%   Weight:      Height:        Exam:   on vent ,sedated  no jvd  throat ett in place  Chest cta bilat and lat  Cor reg no RG  Abd soft ntnd no ascites   GU 3+ scrotal edema   Ext diffuse severe bilat 2- 3+ LE edema. UE edema better.    Neuro on vent and sedated     OP HD: pending   Assessment/ Plan:   Home meds:  - norvasc 5/ lasix 120 bid/ metoprolol 12.5 bid  - kcl 20 qd/ renvela 3 ac tid/ prilosec 40  - asa 81 / lipitor 40  - wellbutrin 150 bid/ prozac 10 qd/ percocet prn/ lyrica 50 tid  - lamotrigine 100 bid/ keppra 500 hs  - actos 15 qd  - coumadin qd  - baclofen 10 tid  - prn's/ vitamins/ supplements   OP PD: f/b [name], lives in [place]   Assessment/ Plan: 1. Acute hypoxic resp failure w/ asystolic cardiac arrest - on presentation. Arrest likely d/t pulm edema. Improving, they are considering extubation.  2. Massive vol overload - w/ pulm edema and anasarca. Either PD is failing or pt is not doing it. Vol overload continues to improve but still has another 10-15kg on most likely. Was net negative 6L yest. Cont max UF on CRRT.  3. ESRD - on CCPD at home. Getting CRRT here started 2/10. Continue.  4. Hypotension - off pressors today, BP's soft but tolerating large UF amounts.  5. Depression - recurrent issue, would recommend psychiatry and palliative care consults when pt out of ICU.  6. Hx seizures - per pmd 7. 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 albuterol  2.5 mg Nebulization TID  . aspirin  81 mg Per Tube Daily  . chlorhexidine gluconate (MEDLINE KIT)  15 mL Mouth Rinse BID  . Chlorhexidine Gluconate Cloth  6 each Topical Daily  . docusate  100 mg Per Tube BID  . feeding supplement (PROSource TF)  45 mL Per Tube BID  . feeding supplement (VITAL HIGH PROTEIN)  1,000 mL Per Tube Q24H  . insulin aspart  0-20 Units Subcutaneous Q4H  . lamoTRIgine  100 mg Per Tube BID  . mouth rinse  15 mL Mouth Rinse 10 times per day  . pantoprazole (PROTONIX) IV  40 mg Intravenous QHS  . polyethylene glycol  17 g Per Tube Daily  . sodium chloride flush  10-40 mL Intracatheter Q12H   .  prismasol BGK 4/2.5 400 mL/hr at 03/09/20 1922  .  prismasol BGK  4/2.5 200 mL/hr at 03/09/20 1921  . ampicillin-sulbactam (UNASYN) IV Stopped (03/10/20 0636)  . dexmedetomidine (PRECEDEX) IV infusion 1.2 mcg/kg/hr (03/10/20 1106)  . fentaNYL infusion INTRAVENOUS 250 mcg/hr (03/10/20 1100)  . heparin 2,000 Units/hr (03/10/20 1100)  . levETIRAcetam Stopped (03/10/20 0932)  . norepinephrine (LEVOPHED) Adult infusion Stopped (03/09/20 1648)  . prismasol BGK 4/2.5 1,800 mL/hr at 03/10/20 0654  . propofol (DIPRIVAN) infusion Stopped (03/10/20 1107)   alteplase, fentaNYL (SUBLIMAZE) injection, fentaNYL (SUBLIMAZE) injection, heparin, labetalol, midazolam, midazolam, sodium chloride, sodium chloride flush

## 2020-03-10 NOTE — Progress Notes (Signed)
eLink Physician-Brief Progress Note Patient Name: Max Pittman. DOB: April 28, 1975 MRN: [id number]   Date of Service  03/10/2020  HPI/Events of Note  ABG post self extubation : stable pco2 at 50. On precedex gtt.   eICU Interventions  Follow ABG and cal back if worsening hypercarbia.Neuro stable.      Intervention Category Intermediate Interventions: Other:  Elmer Sow 03/10/2020, 11:08 PM

## 2020-03-11 ENCOUNTER — Inpatient Hospital Stay (HOSPITAL_COMMUNITY): Payer: Medicare Other

## 2020-03-11 ENCOUNTER — Telehealth: Payer: Medicare Other | Admitting: Infectious Diseases

## 2020-03-11 ENCOUNTER — Telehealth: Payer: Self-pay | Admitting: Infectious Diseases

## 2020-03-11 DIAGNOSIS — J9601 Acute respiratory failure with hypoxia: Secondary | ICD-10-CM | POA: Diagnosis not present

## 2020-03-11 LAB — RENAL FUNCTION PANEL
Albumin: 1.9 g/dL — ABNORMAL LOW (ref 3.5–5.0)
Albumin: 2 g/dL — ABNORMAL LOW (ref 3.5–5.0)
Anion gap: 8 (ref 5–15)
Anion gap: 9 (ref 5–15)
BUN: 13 mg/dL (ref 6–20)
BUN: 13 mg/dL (ref 6–20)
CO2: 26 mmol/L (ref 22–32)
CO2: 25 mmol/L (ref 22–32)
Calcium: 8.7 mg/dL — ABNORMAL LOW (ref 8.9–10.3)
Calcium: 8.9 mg/dL (ref 8.9–10.3)
Chloride: 100 mmol/L (ref 98–111)
Chloride: 101 mmol/L (ref 98–111)
Creatinine, Ser: 2.18 mg/dL — ABNORMAL HIGH (ref 0.61–1.24)
Creatinine, Ser: 2.18 mg/dL — ABNORMAL HIGH (ref 0.61–1.24)
GFR, Estimated: 38 mL/min — ABNORMAL LOW (ref >60)
GFR, Estimated: 38 mL/min — ABNORMAL LOW (ref >60)
Glucose, Bld: 180 mg/dL — ABNORMAL HIGH (ref 70–99)
Glucose, Bld: 111 mg/dL — ABNORMAL HIGH (ref 70–99)
Phosphorus: 2.3 mg/dL — ABNORMAL LOW (ref 2.5–4.6)
Phosphorus: 1.9 mg/dL — ABNORMAL LOW (ref 2.5–4.6)
Potassium: 4 mmol/L (ref 3.5–5.1)
Potassium: 4.5 mmol/L (ref 3.5–5.1)
Sodium: 134 mmol/L — ABNORMAL LOW (ref 135–145)
Sodium: 135 mmol/L (ref 135–145)

## 2020-03-11 LAB — HEPARIN LEVEL (UNFRACTIONATED)
Heparin Unfractionated: 0.39 IU/mL (ref 0.30–0.70)
Heparin Unfractionated: 0.42 IU/mL (ref 0.30–0.70)

## 2020-03-11 LAB — CBC
HCT: 28.2 % — ABNORMAL LOW (ref 39.0–52.0)
Hemoglobin: 8.5 g/dL — ABNORMAL LOW (ref 13.0–17.0)
MCH: 27.7 pg (ref 26.0–34.0)
MCHC: 30.1 g/dL (ref 30.0–36.0)
MCV: 91.9 fL (ref 80.0–100.0)
Platelets: 155 10*3/uL (ref 150–400)
RBC: 3.07 MIL/uL — ABNORMAL LOW (ref 4.22–5.81)
RDW: 17.2 % — ABNORMAL HIGH (ref 11.5–15.5)
WBC: 8.1 10*3/uL (ref 4.0–10.5)
nRBC: 0.7 % — ABNORMAL HIGH (ref 0.0–0.2)

## 2020-03-11 LAB — POCT I-STAT 7, (LYTES, BLD GAS, ICA,H+H)
Acid-Base Excess: 1 mmol/L (ref 0.0–2.0)
Bicarbonate: 27.1 mmol/L (ref 20.0–28.0)
Calcium, Ion: 1.25 mmol/L (ref 1.15–1.40)
HCT: 27 % — ABNORMAL LOW (ref 39.0–52.0)
Hemoglobin: 9.2 g/dL — ABNORMAL LOW (ref 13.0–17.0)
O2 Saturation: 97 %
Patient temperature: 96.2
Potassium: 4.1 mmol/L (ref 3.5–5.1)
Sodium: 138 mmol/L (ref 135–145)
TCO2: 29 mmol/L (ref 22–32)
pCO2 arterial: 46.6 mmHg (ref 32.0–48.0)
pH, Arterial: 7.367 (ref 7.350–7.450)
pO2, Arterial: 85 mmHg (ref 83.0–108.0)

## 2020-03-11 LAB — TRIGLYCERIDES: Triglycerides: 208 mg/dL — ABNORMAL HIGH (ref <150)

## 2020-03-11 LAB — GLUCOSE, CAPILLARY
Glucose-Capillary: 164 mg/dL — ABNORMAL HIGH (ref 70–99)
Glucose-Capillary: 143 mg/dL — ABNORMAL HIGH (ref 70–99)
Glucose-Capillary: 130 mg/dL — ABNORMAL HIGH (ref 70–99)
Glucose-Capillary: 90 mg/dL (ref 70–99)
Glucose-Capillary: 96 mg/dL (ref 70–99)

## 2020-03-11 LAB — PROTIME-INR
INR: 1.2 (ref 0.8–1.2)
Prothrombin Time: 15.1 seconds (ref 11.4–15.2)

## 2020-03-11 LAB — MAGNESIUM: Magnesium: 2.2 mg/dL (ref 1.7–2.4)

## 2020-03-11 IMAGING — DX DG CHEST 1V PORT
1 series · 1 of 1 positions shown · non-contrast
Comparison: [DATE]

CLINICAL DATA: Shortness of breath

EXAM:
PORTABLE CHEST 1 VIEW

[chest ap]
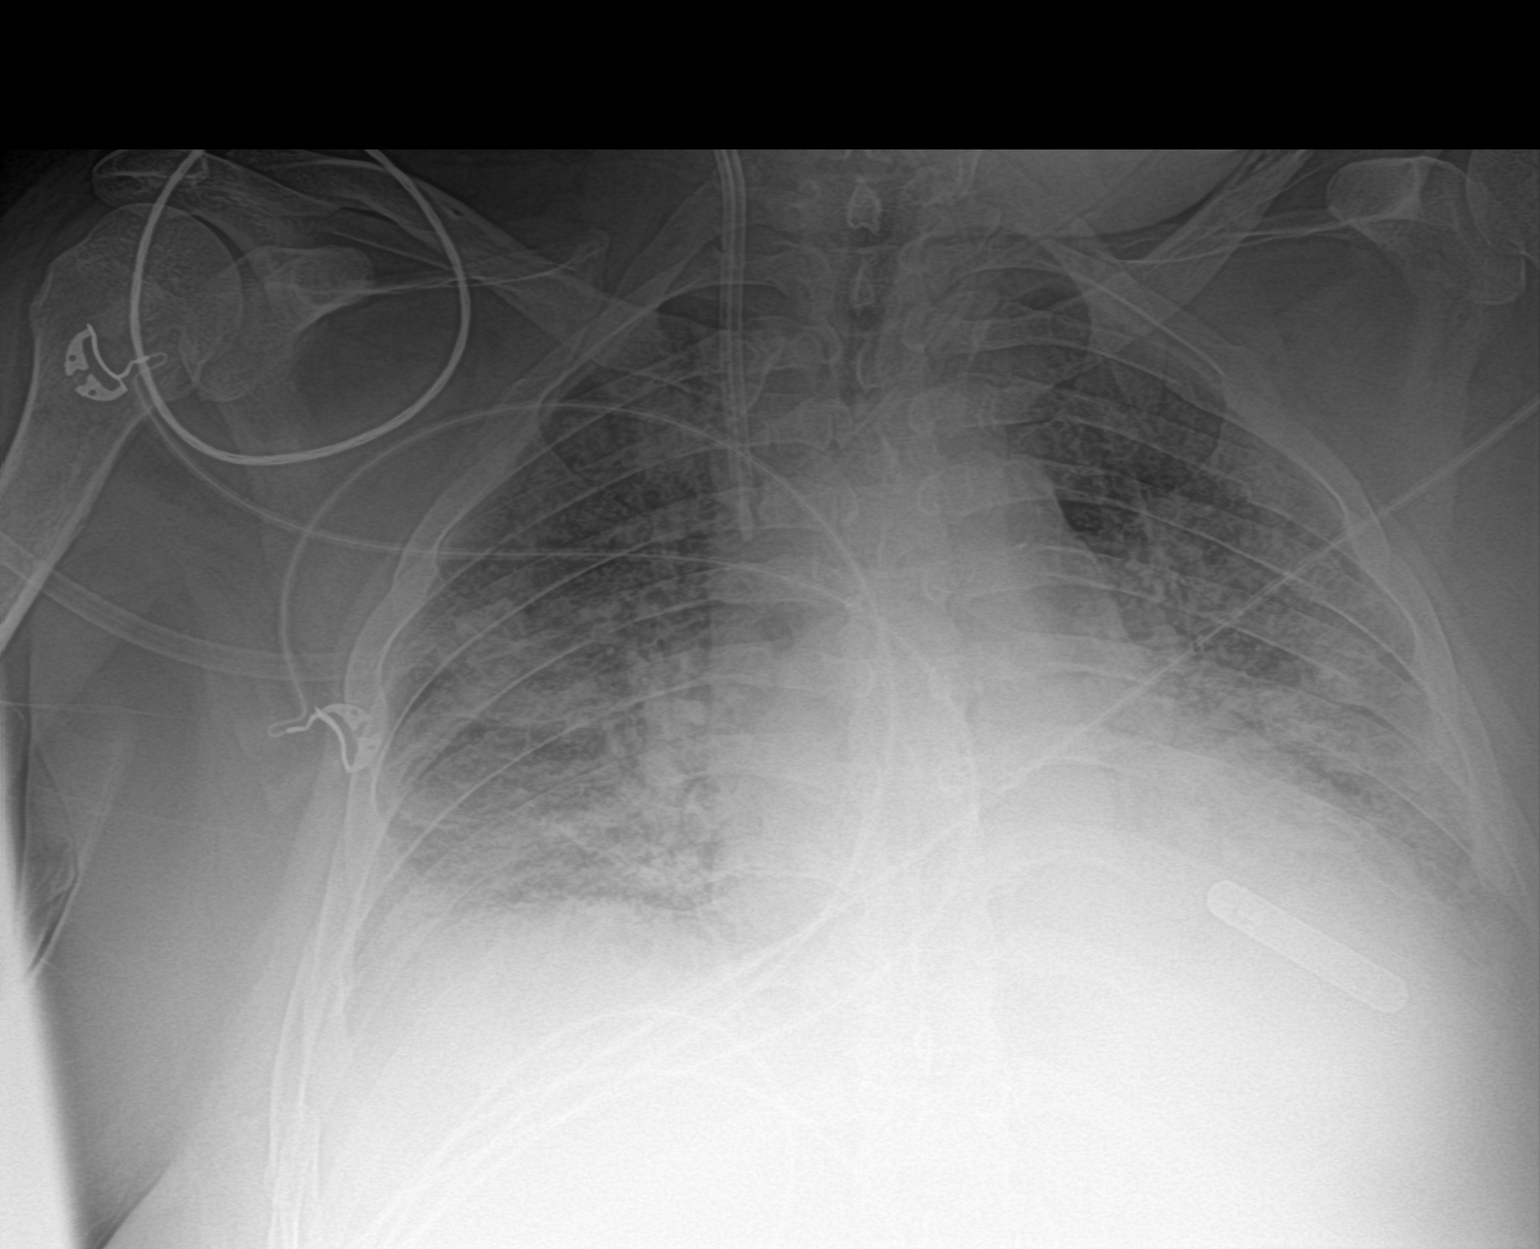

[1 of 1 positions shown; findings below may reference images not displayed]

FINDINGS: Endotracheal tube and nasogastric tube no longer present. Central
catheter tip is in the superior vena cava. No evident pneumothorax.
There is a small left pleural effusion. There is ill-defined
airspace opacity throughout the lungs bilaterally, similar to recent
prior study. Heart is mildly enlarged with pulmonary vascularity
normal. No adenopathy. Loop recorder on the left.
IMPRESSION: Central catheter position unchanged. Endotracheal tube and
nasogastric tube no longer present. No pneumothorax. Multifocal
airspace opacity persists with small left pleural effusion and
cardiomegaly. No new opacity appreciable.

## 2020-03-11 MED ORDER — DEXTROSE 10 % IV SOLN: INTRAVENOUS | Status: DC

## 2020-03-11 MED ORDER — CEPHALEXIN 250 MG PO CAPS: 250.0000 mg | ORAL_CAPSULE | Freq: Two times a day (BID) | ORAL | 3 refills | Status: DC

## 2020-03-11 MED ORDER — CEPHALEXIN 250 MG PO CAPS
250.0000 mg | ORAL_CAPSULE | Freq: Two times a day (BID) | ORAL | Status: DC
  Filled 2020-03-11: qty 1

## 2020-03-11 NOTE — Progress Notes (Signed)
RT placed patient on BIPAP per MD order. Patient tolerating BIPAP well at this time. RT will monitor as needed.

## 2020-03-11 NOTE — Progress Notes (Signed)
[place] for IV heparin Indication: Hx PE, Chronic DVT  Allergies  Allergen Reactions  . Doxycycline Hives  . Latex Swelling    Pt reports swelling at site.   . Morphine And Related Anxiety    Patient Measurements: Height: '5\' 9"'$  (175.3 cm) Weight: 133 kg (293 lb 3.4 oz) IBW/kg (Calculated) : 70.7 Heparin Dosing Weight: 95 kg  Vital Signs: Temp: 98.1 F (36.7 C) (02/14 0730) Temp Source: Axillary (02/14 0730) BP: 129/56 (02/14 0730) Pulse Rate: 81 (02/14 0730)  Labs: Recent Labs    03/09/20 0401 03/09/20 0420 03/09/20 1426 03/10/20 0420 03/10/20 0757 03/10/20 1154 03/10/20 1543 03/10/20 2034 03/10/20 2331 03/11/20 0347 03/11/20 0352 03/11/20 0438  HGB  --  8.4*  --  7.7*  --   --   --    < > 8.8* 8.5* 9.2*  --   HCT  --  28.0*  --  24.2*  --   --   --    < > 26.0* 28.2* 27.0*  --   PLT  --  174  --  148*  --   --   --   --   --  155  --   --   APTT 50*  --   --   --  >200* 116*  --   --   --   --   --   --   LABPROT 21.4*  --   --   --  24.6*  --   --   --   --  15.1  --   --   INR 1.9*  --   --   --  2.3*  --   --   --   --  1.2  --   --   HEPARINUNFRC  --   --    < >  --  >2.20* 0.33  --   --   --  0.39  --  0.42  CREATININE 3.88*  --    < > 2.79*  --   --  2.39*  --   --  2.18*  --   --    < > = values in this interval not displayed.    Estimated Creatinine Clearance: 59.1 mL/min (A) (by C-G formula based on SCr of 2.18 mg/dL (H)).   Assessment: 26 yoM on warfarin PTA for hx PE and chronic DVTs admitted s/p cardiac arrest. Warfarin held, heparin started 2/12 with INR <2.  Heparin level therapeutic at 0.42, INR down to 1.2. Hgb improved to 9.2 this morning.    Goal of Therapy:  Heparin level 0.3-0.7 units/ml Monitor platelets by anticoagulation protocol: Yes   Plan:  Continue heparin 2000 units/h Daily heparin level and CBC   Erin Hearing PharmD., BCPS Clinical Pharmacist 03/11/2020 8:49 AM

## 2020-03-11 NOTE — Progress Notes (Signed)
While wearing the bi-pap, patient became agitated and refused to continue to wear bi-pap. High flow nasal cannula reapplied. Oxygen saturation reading 100% via pulse ox. Will obtain ABG and continue to monitor.

## 2020-03-11 NOTE — Evaluation (Signed)
Clinical/Bedside Swallow Evaluation Patient Details  Name: Max Pittman. MRN: [id number] Date of Birth: [birth year]  Today's Date: 03/11/2020 Time: SLP Start Time (ACUTE ONLY): 1200 SLP Stop Time (ACUTE ONLY): 1218 SLP Time Calculation (min) (ACUTE ONLY): 18 min  Past Medical History:  Past Medical History:  Diagnosis Date  . Arthritis   . Closed dislocation of left hip ([place]) 05/21/2019  . Diabetes ([place])   . Diabetes mellitus without complication ([place])   . DVT (deep venous thrombosis) ([place])   . History of peritoneal dialysis   . Hypertension   . Renal disorder    FFGS  . Renal disorder   . Sleep apnea   . Stroke [facility])    when ha was a child  . Vasovagal syndrome    with syncope   Past Surgical History:  Past Surgical History:  Procedure Laterality Date  . AMPUTATION Right 07/21/2019   Procedure: Right index finger amputation as necessary at distal interphalangeal joint;  Surgeon: Roseanne Kaufman, MD;  Location: [place];  Service: Orthopedics;  Laterality: Right;  60 mins  . APPLICATION OF WOUND VAC Left 05/22/2019   Procedure: APPLICATION OF WOUND VAC  LEFT HIP;  Surgeon: Altamese [place], MD;  Location: [place];  Service: Orthopedics;  Laterality: Left;  . APPLICATION OF WOUND VAC Left 07/17/2019   Procedure: WOUND VAC CHANGE;  Surgeon: Altamese [place], MD;  Location: [place];  Service: Orthopedics;  Laterality: Left;  . AV FISTULA INSERTION W/ RF MAGNETIC GUIDANCE Left   . AV FISTULA PLACEMENT    . BUBBLE STUDY  05/25/2019   Procedure: BUBBLE STUDY;  Surgeon: Dorothy Spark, MD;  Location: [place];  Service: Cardiovascular;;  . CHOLECYSTECTOMY    . HERNIA REPAIR    . HIP CLOSED REDUCTION Left 05/20/2019   Procedure: CLOSED REDUCTION HIP WITH TRACTION PIN APPLICATION;  Surgeon: Altamese [place], MD;  Location: [place];  Service: Orthopedics;  Laterality: Left;  . INCISION AND DRAINAGE HIP Left 07/14/2019   Procedure: IRRIGATION AND DEBRIDEMENT HIP;  Surgeon: Altamese [place],  MD;  Location: [place];  Service: Orthopedics;  Laterality: Left;  . INCISION AND DRAINAGE HIP Left 07/17/2019   Procedure: REPEAT IRRIGATION AND DEBRIDEMENT LEFT HIP;  Surgeon: Altamese [place], MD;  Location: [place];  Service: Orthopedics;  Laterality: Left;  . IR FLUORO GUIDE CV LINE RIGHT  05/23/2019  . IR FLUORO GUIDE CV LINE RIGHT  07/21/2019  . IR REMOVAL TUN CV CATH W/O FL  08/29/2019  . IR US GUIDE VASC ACCESS RIGHT  05/23/2019  . IR US GUIDE VASC ACCESS RIGHT  07/21/2019  . LEFT HEART CATH AND CORONARY ANGIOGRAPHY N/A 03/22/2019   Procedure: LEFT HEART CATH AND CORONARY ANGIOGRAPHY;  Surgeon: Wellington [place], MD;  Location: [place] CV LAB;  Service: Cardiovascular;  Laterality: N/A;  . LOOP RECORDER INSERTION N/A 05/25/2019   Procedure: LOOP RECORDER INSERTION;  Surgeon: [name], MD;  Location: [place] CV LAB;  Service: Cardiovascular;  Laterality: N/A;  . ORIF ACETABULAR FRACTURE Left 05/22/2019   Procedure: OPEN REDUCTION INTERNAL FIXATION (ORIF) T TYPE  WITH ASSOCIATED POSTERIOR WALL ACETABULAR FRACTURE, LEFT; REMOVAL OF TRACTION PIN LEFT TIBIA;  Surgeon: Altamese Michie, MD;  Location: [place];  Service: Orthopedics;  Laterality: Left;  . REMOVAL OF A DIALYSIS CATHETER Right 07/17/2019   Procedure: REMOVAL OF HEMODIALYSIS DIALYSIS CATHETER;  Surgeon: Altamese [place]-Sydney, MD;  Location: [place];  Service: Orthopedics;  Laterality: Right;  . TEE WITHOUT CARDIOVERSION N/A 05/25/2019  Procedure: TRANSESOPHAGEAL ECHOCARDIOGRAM (TEE);  Surgeon: Dorothy Spark, MD;  Location: [facility] ENDOSCOPY;  Service: Cardiovascular;  Laterality: N/A;  . TEE WITHOUT CARDIOVERSION N/A 07/19/2019   Procedure: TRANSESOPHAGEAL ECHOCARDIOGRAM (TEE);  Surgeon: Sueanne Margarita, MD;  Location: [facility] ENDOSCOPY;  Service: Cardiovascular;  Laterality: N/A;  . TONSILLECTOMY     HPI:  Max Pittman. is a 45 y.o. male who has a PMH including but not limited to ESRD on PD, DM, NSTEMI, HTN, DVT on warfarin, OSA  (see "past  medical history" for rest).  During early AM hours 2/10, he had worsening dyspnea at home.  Had apparently been experiencing dyspnea and trying to manage it with home dialysis.  EMS was called and he initially refused transport to ED; however, after their arrival, he had a cardiac arrest.  Initial rhythm was asystole and he required 12 minutes before ROSC.Intubated 2/10, self extubated at 2/14 at midnight.   Assessment / Plan / Recommendation Clinical Impression  Pt demonstrates immediate coughing following all trials of PO textures. His cough is strong and his swallow otherwise appears normal. He does have hoarse vocal quality after self extubation at midnight last night. Pt likely has an acute reversible dysphagia that is expected to resolve in the next 48 hours. Recommend pt remain NPO except for ice chips, SLP will f/u tomorrow to determine if pt has improved. SLP Visit Diagnosis: Dysphagia, oropharyngeal phase (R13.12)    Aspiration Risk  Mild aspiration risk    Diet Recommendation NPO;Ice chips PRN after oral care   Medication Administration: Via alternative means    Other  Recommendations Oral Care Recommendations: Oral care QID   Follow up Recommendations Inpatient Rehab      Frequency and Duration min 2x/week  2 weeks       Prognosis Prognosis for Safe Diet Advancement: Good Barriers to Reach Goals: Cognitive deficits      Swallow Study   General HPI: Max Ahmann. is a 45 y.o. male who has a PMH including but not limited to ESRD on PD, DM, NSTEMI, HTN, DVT on warfarin, OSA  (see "past medical history" for rest).  During early AM hours 2/10, he had worsening dyspnea at home.  Had apparently been experiencing dyspnea and trying to manage it with home dialysis.  EMS was called and he initially refused transport to ED; however, after their arrival, he had a cardiac arrest.  Initial rhythm was asystole and he required 12 minutes before ROSC.Intubated 2/10, self extubated at  2/14 at midnight. Type of Study: Bedside Swallow Evaluation Previous Swallow Assessment: none Diet Prior to this Study: NPO Temperature Spikes Noted: No Respiratory Status: Nasal cannula History of Recent Intubation: Yes Length of Intubations (days): 4 days Date extubated: 03/11/20 Behavior/Cognition: Alert;Cooperative;Requires cueing Oral Cavity Assessment: Within Functional Limits Oral Care Completed by SLP: No Oral Cavity - Dentition: Adequate natural dentition Vision: Functional for self-feeding Self-Feeding Abilities: Total assist Patient Positioning: Upright in bed Baseline Vocal Quality: Hoarse Volitional Cough: Congested;Strong Volitional Swallow: Able to elicit    Oral/Motor/Sensory Function Overall Oral Motor/Sensory Function: Within functional limits   Ice Chips Ice chips: Impaired Presentation: Spoon Pharyngeal Phase Impairments: Cough - Immediate   Thin Liquid Thin Liquid: Impaired Presentation: Straw Pharyngeal  Phase Impairments: Cough - Immediate    Nectar Thick Nectar Thick Liquid: Impaired Presentation: Straw Pharyngeal Phase Impairments: Cough - Immediate   Honey Thick Honey Thick Liquid: Not tested   Puree Puree: Impaired Pharyngeal Phase Impairments: Cough - Immediate  Solid     Solid: Not tested     Herbie Baltimore, MA [place] Pager [phone] Office [phone]  Lynann Beaver 03/11/2020,1:08 PM

## 2020-03-11 NOTE — Evaluation (Signed)
Physical Therapy Evaluation Patient Details Name: Max Pittman. MRN: [id number] DOB: 06/11/1975 Today's Date: 03/11/2020   History of Present Illness  Max Pittman. is a 45 y.o. male who during early AM hours 2/10,  had worsening dyspnea at home.  Had apparently been experiencing dyspnea and trying to manage it with home peritoneal dialysis.  EMS was called and he initially refused transport to ED; however, after their arrival, he had a cardiac arrest.  Initial rhythm was asystole and he required 12 minutes before ROSC. Pt intubated 2/10, self extubated at 2/14 at midnight.  Also on CRRT.  PMH: including but not limited to ESRD on PD, DM, NSTEMI, HTN, DVT on warfarin, OSA.  Clinical Impression  Pt admitted with above diagnosis. Pt was able to assist with UE and LE exercises with bed level evaluation per nurse request due to CRRT in place and nurse not ready for pt to come to EOB.  Pt is confused as well. Oriented to self only. Will progress pt as able. Pt was able to move all 4 extremities at present time.  Pt currently with functional limitations due to the deficits listed below (see PT Problem List). Pt will benefit from skilled PT to increase their independence and safety with mobility to allow discharge to the venue listed below.      Follow Up Recommendations Other (comment) (TBA once mobility can be assessed)    Equipment Recommendations  Other (comment) (TBA)    Recommendations for Other Services       Precautions / Restrictions Precautions Precautions: Fall Precaution Comments: CRRT neck access Restrictions Weight Bearing Restrictions: No      Mobility  Bed Mobility Overal bed mobility: Needs Assistance             General bed mobility comments: Bed level eval desired per nurse request    Transfers                 General transfer comment: NT  Ambulation/Gait             General Gait Details: NT  Stairs            Wheelchair  Mobility    Modified Rankin (Stroke Patients Only)       Balance                                             Pertinent Vitals/Pain Pain Assessment: Faces Faces Pain Scale: Hurts little more Pain Location: left knee Pain Descriptors / Indicators: Grimacing;Guarding;Aching Pain Intervention(s): Limited activity within patient's tolerance;Monitored during session;Repositioned    Home Living Family/patient expects to be discharged to:: Private residence Living Arrangements: Spouse/significant other Available Help at Discharge: Available 24 hours/day (girlfriend) Type of Home: Mobile home Home Access: Stairs to enter Entrance Stairs-Rails: Left Entrance Stairs-Number of Steps: 3 Home Layout: One level Home Equipment: Walker - 2 wheels;Bedside commode;Adaptive equipment      Prior Function Level of Independence: Needs assistance   Gait / Transfers Assistance Needed: pt reports ambulating with RW at home  ADL's / Homemaking Assistance Needed: Able to perform ADLs with some difficulty using AE/ girlfriend assists with PD  Comments: Pt currently unreliable historian. All info above was from previous admission in June 2021.     Hand Dominance   Dominant Hand: Right    Extremity/Trunk Assessment   Upper Extremity  Assessment Upper Extremity Assessment: Defer to OT evaluation    Lower Extremity Assessment Lower Extremity Assessment: Generalized weakness       Communication   Communication: No difficulties  Cognition Arousal/Alertness: Awake/alert Behavior During Therapy: Flat affect Overall Cognitive Status: Impaired/Different from baseline Area of Impairment: Orientation;Safety/judgement;Memory;Following commands;Awareness;Problem solving                 Orientation Level: Disoriented to;Place;Time;Situation   Memory: Decreased short-term memory Following Commands: Follows one step commands consistently Safety/Judgement: Decreased  awareness of safety;Decreased awareness of deficits   Problem Solving: Slow processing;Requires verbal cues;Difficulty sequencing General Comments: Pt unaware of being in hospital, November 2022 per pt      General Comments General comments (skin integrity, edema, etc.): VSS    Exercises General Exercises - Upper Extremity Shoulder Flexion: AROM;Both;10 reps;Supine Shoulder Extension: AROM;Both;10 reps;Supine Elbow Flexion: AROM;Both;10 reps;Supine Elbow Extension: AROM;Both;10 reps;Supine Wrist Flexion: AROM;Both;10 reps;Supine Wrist Extension: AROM;Both;10 reps;Supine Digit Composite Flexion: AROM;Both;10 reps;Supine Composite Extension: AROM;Both;10 reps;Supine General Exercises - Lower Extremity Ankle Circles/Pumps: AROM;Both;10 reps;Supine Quad Sets: AROM;Both;10 reps;Supine Heel Slides: AROM;Both;10 reps;Supine   Assessment/Plan    PT Assessment Patient needs continued PT services  PT Problem List Decreased balance;Decreased mobility;Decreased activity tolerance;Decreased knowledge of use of DME;Decreased safety awareness;Cardiopulmonary status limiting activity       PT Treatment Interventions DME instruction;Gait training;Functional mobility training;Therapeutic activities;Therapeutic exercise;Balance training;Patient/family education    PT Goals (Current goals can be found in the Care Plan section)  Acute Rehab PT Goals Patient Stated Goal: unable to state PT Goal Formulation: Patient unable to participate in goal setting Time For Goal Achievement: 03/25/20 Potential to Achieve Goals: Good    Frequency Min 3X/week   Barriers to discharge        Co-evaluation               AM-PAC PT "6 Clicks" Mobility  Outcome Measure Help needed turning from your back to your side while in a flat bed without using bedrails?: A Lot Help needed moving from lying on your back to sitting on the side of a flat bed without using bedrails?: A Lot Help needed moving to and  from a bed to a chair (including a wheelchair)?: Total Help needed standing up from a chair using your arms (e.g., wheelchair or bedside chair)?: Total Help needed to walk in hospital room?: Total Help needed climbing 3-5 steps with a railing? : Total 6 Click Score: 8    End of Session Equipment Utilized During Treatment: Gait belt Activity Tolerance: Patient limited by fatigue Patient left: with call bell/phone within reach;in bed;with bed alarm set;with SCD's reapplied;with restraints reapplied Nurse Communication: Mobility status PT Visit Diagnosis: Muscle weakness (generalized) (M62.81)    Time: [id number] PT Time Calculation (min) (ACUTE ONLY): 17 min   Charges:   PT Evaluation $PT Eval Moderate Complexity: [place] W,PT Acute Rehabilitation Services Pager:  [phone]  Office:  [place] 03/11/2020, 1:58 PM

## 2020-03-11 NOTE — Progress Notes (Signed)
Patient ID: Max Pittman., male   DOB: [birth year], 45 y.o.   MRN: [id number] [facility] Progress Note   Assessment/ Plan:   1.  Acute hypoxic respiratory failure: Secondary to asystolic cardiac arrest likely provoked by volume overload/pulmonary edema. 2. ESRD: With significant anasarca on admission raising concerns for modality failure versus nonadherence with peritoneal dialysis.  Transitioned to CRRT for aggressive volume unloading and so far net -18 L (5.8 L overnight).  He continues to have significant volume excess on exam-continue CRRT on current prescription. 3. Anemia: Without overt blood loss, anemia severity may have been further amplified by hemodilution in the setting of significant anasarca with improving hemoglobin and hematocrit trend.  No indications for PRBC transfusion, continue ESA. 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  Not on binders. 5. Nutrition: Currently n.p.o., on tube feeds/feeding supplement. 6. Hypotension: He is on low-dose pressor with ongoing CRRT/ultrafiltration.  Subjective:   He self extubated himself overnight and appears lethargic but able to protect his airway.  He does not respond to questions.   Objective:   BP (!) 129/56 (BP Location: Right Leg)   Pulse 81   Temp 98.1 F (36.7 C) (Axillary)   Resp 20   Ht '5\' 9"'$  (1.753 m)   Wt 133 kg   SpO2 100%   BMI 43.30 kg/m   Physical Exam: Gen: Lethargic, comfortable on oxygen via nasal cannula CVS: Pulse regular rhythm, normal rate, S1 and S2 normal Resp: Decreased breath sounds over bases, no distinct rhonchi/rales Abd: Soft, obese, nontender, PD catheter in situ Ext: 2+-3+ anasarca  Labs: BMET Recent Labs  Lab 03/08/20 0355 03/08/20 1630 03/09/20 0331 03/09/20 0401 03/09/20 2010 03/10/20 0420 03/10/20 1543 03/10/20 2034 03/10/20 2331 03/11/20 0347 03/11/20 0352  NA 136 138   < > 136 134* 136 142 137  137 134* 138  K 3.3* 4.0   < > 3.9 3.7 4.0 4.5 3.9 4.0 4.0 4.1  CL 99 102  --  102 102 101 101  --   --  100  --   CO2 27 26  --  '27 26 26 24  '$ --   --  26  --   GLUCOSE 149* 126*  --  137* 117* 132* 158*  --   --  180*  --   BUN 15 14  --  '12 13 15 14  '$ --   --  13  --   CREATININE 6.00* 4.75*  --  3.88* 3.03* 2.79* 2.39*  --   --  2.18*  --   CALCIUM 7.8* 8.1*  --  8.1* 8.1* 8.1* 8.4*  --   --  8.7*  --   PHOS 3.3 2.8  --  2.6 1.7* 2.0* 2.1*  --   --  2.3*  --    < > = values in this interval not displayed.   CBC Recent Labs  Lab 03/07/20 0239 03/07/20 0344 03/08/20 0355 03/09/20 0331 03/09/20 0420 03/10/20 0420 03/10/20 2034 03/10/20 2331 03/11/20 0347 03/11/20 0352  WBC 10.0  --  8.4  --  8.8 5.6  --   --  8.1  --   NEUTROABS 6.8  --   --   --   --   --   --   --   --   --   HGB 9.2*   < > 7.9*   < > 8.4* 7.7* 8.5* 8.8* 8.5* 9.2*  HCT 29.9*   < > 24.4*   < > 28.0* 24.2* 25.0* 26.0* 28.2* 27.0*  MCV 88.7  --  88.7  --  91.5 90.3  --   --  91.9  --   PLT 209  --  154  --  174 148*  --   --  155  --    < > = values in this interval not displayed.     Medications:    . albuterol  2.5 mg Nebulization TID  . aspirin  81 mg Per Tube Daily  . Chlorhexidine Gluconate Cloth  6 each Topical Daily  . docusate  100 mg Per Tube BID  . feeding supplement (PROSource TF)  45 mL Per Tube BID  . feeding supplement (VITAL HIGH PROTEIN)  1,000 mL Per Tube Q24H  . insulin aspart  0-20 Units Subcutaneous Q4H  . lamoTRIgine  100 mg Per Tube BID  . mouth rinse  15 mL Mouth Rinse BID  . pantoprazole (PROTONIX) IV  40 mg Intravenous QHS  . polyethylene glycol  17 g Per Tube Daily  . sodium chloride flush  10-40 mL Intracatheter Q12H   Elmarie Shiley, MD 03/11/2020, 7:41 AM

## 2020-03-11 NOTE — Telephone Encounter (Signed)
Patient was due for FU on chronic hardware infection of left hip due to MSSA today. Reviewed chart and he is in ICU following cardiac arrest. Currently on Unasyn which will cover his infection.   Will reach out to pharmacy inpatient to see if they can continue suppressive cephalexin after his acute antibiotic needs are met.   I will also send in refills to continue outpatient to avoid lapse. Will need follow up after hospitalization.    Janene Madeira, MSN, NP-C [facility] [email or web address] Pager: [phone] Office: [phone] [place]: [phone]
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
## 2020-03-12 DIAGNOSIS — J9601 Acute respiratory failure with hypoxia: Secondary | ICD-10-CM | POA: Diagnosis not present

## 2020-03-12 LAB — POCT ACTIVATED CLOTTING TIME
Activated Clotting Time: 136 seconds
Activated Clotting Time: 142 seconds

## 2020-03-12 LAB — MAGNESIUM: Magnesium: 2.6 mg/dL — ABNORMAL HIGH (ref 1.7–2.4)

## 2020-03-12 LAB — CBC
HCT: 28 % — ABNORMAL LOW (ref 39.0–52.0)
Hemoglobin: 8.6 g/dL — ABNORMAL LOW (ref 13.0–17.0)
MCH: 27.7 pg (ref 26.0–34.0)
MCHC: 30.7 g/dL (ref 30.0–36.0)
MCV: 90.3 fL (ref 80.0–100.0)
Platelets: 169 10*3/uL (ref 150–400)
RBC: 3.1 MIL/uL — ABNORMAL LOW (ref 4.22–5.81)
RDW: 17.6 % — ABNORMAL HIGH (ref 11.5–15.5)
WBC: 9.2 10*3/uL (ref 4.0–10.5)
nRBC: 0.7 % — ABNORMAL HIGH (ref 0.0–0.2)

## 2020-03-12 LAB — RENAL FUNCTION PANEL
Albumin: 2.1 g/dL — ABNORMAL LOW (ref 3.5–5.0)
Albumin: 2.1 g/dL — ABNORMAL LOW (ref 3.5–5.0)
Anion gap: 11 (ref 5–15)
Anion gap: 9 (ref 5–15)
BUN: 13 mg/dL (ref 6–20)
BUN: 11 mg/dL (ref 6–20)
CO2: 24 mmol/L (ref 22–32)
CO2: 25 mmol/L (ref 22–32)
Calcium: 9.2 mg/dL (ref 8.9–10.3)
Calcium: 9.1 mg/dL (ref 8.9–10.3)
Chloride: 101 mmol/L (ref 98–111)
Chloride: 100 mmol/L (ref 98–111)
Creatinine, Ser: 2.27 mg/dL — ABNORMAL HIGH (ref 0.61–1.24)
Creatinine, Ser: 2.2 mg/dL — ABNORMAL HIGH (ref 0.61–1.24)
GFR, Estimated: 36 mL/min — ABNORMAL LOW (ref >60)
GFR, Estimated: 37 mL/min — ABNORMAL LOW (ref >60)
Glucose, Bld: 142 mg/dL — ABNORMAL HIGH (ref 70–99)
Glucose, Bld: 142 mg/dL — ABNORMAL HIGH (ref 70–99)
Phosphorus: 1.6 mg/dL — ABNORMAL LOW (ref 2.5–4.6)
Phosphorus: 2.6 mg/dL (ref 2.5–4.6)
Potassium: 4.4 mmol/L (ref 3.5–5.1)
Potassium: 4.1 mmol/L (ref 3.5–5.1)
Sodium: 136 mmol/L (ref 135–145)
Sodium: 134 mmol/L — ABNORMAL LOW (ref 135–145)

## 2020-03-12 LAB — GLUCOSE, CAPILLARY
Glucose-Capillary: 115 mg/dL — ABNORMAL HIGH (ref 70–99)
Glucose-Capillary: 117 mg/dL — ABNORMAL HIGH (ref 70–99)
Glucose-Capillary: 118 mg/dL — ABNORMAL HIGH (ref 70–99)
Glucose-Capillary: 118 mg/dL — ABNORMAL HIGH (ref 70–99)
Glucose-Capillary: 128 mg/dL — ABNORMAL HIGH (ref 70–99)
Glucose-Capillary: 143 mg/dL — ABNORMAL HIGH (ref 70–99)
Glucose-Capillary: 165 mg/dL — ABNORMAL HIGH (ref 70–99)

## 2020-03-12 LAB — HEPARIN LEVEL (UNFRACTIONATED)
Heparin Unfractionated: 0.19 IU/mL — ABNORMAL LOW (ref 0.30–0.70)
Heparin Unfractionated: 0.21 IU/mL — ABNORMAL LOW (ref 0.30–0.70)
Heparin Unfractionated: <0.1 IU/mL — ABNORMAL LOW (ref 0.30–0.70)

## 2020-03-12 LAB — PROTIME-INR
INR: 1.2 (ref 0.8–1.2)
Prothrombin Time: 14.5 seconds (ref 11.4–15.2)

## 2020-03-12 MED ORDER — DOCUSATE SODIUM 100 MG PO CAPS
100.0000 mg | ORAL_CAPSULE | Freq: Two times a day (BID) | ORAL | Status: DC
  Administered 2020-03-12 – 2020-03-24 (×12): 100 mg via ORAL
  Filled 2020-03-12 (×21): qty 1

## 2020-03-12 MED ORDER — POTASSIUM PHOSPHATES 15 MMOLE/5ML IV SOLN
20.0000 mmol | Freq: Once | INTRAVENOUS | Status: AC
  Administered 2020-03-12: 20 mmol via INTRAVENOUS
  Filled 2020-03-12: qty 6.67

## 2020-03-12 MED ORDER — POLYETHYLENE GLYCOL 3350 17 G PO PACK
17.0000 g | PACK | Freq: Every day | ORAL | Status: DC
  Filled 2020-03-12 (×4): qty 1

## 2020-03-12 MED ORDER — LAMOTRIGINE 25 MG PO TABS
100.0000 mg | ORAL_TABLET | Freq: Two times a day (BID) | ORAL | Status: DC
  Administered 2020-03-12 – 2020-03-25 (×23): 100 mg via ORAL
  Filled 2020-03-12: qty 1
  Filled 2020-03-12 (×6): qty 4
  Filled 2020-03-12 (×2): qty 1
  Filled 2020-03-12 (×6): qty 4
  Filled 2020-03-12: qty 1
  Filled 2020-03-12 (×6): qty 4
  Filled 2020-03-12: qty 1

## 2020-03-12 MED ORDER — HEPARIN BOLUS VIA INFUSION (CRRT)
1000.0000 [IU] | INTRAVENOUS | Status: DC | PRN
  Administered 2020-03-12 – 2020-03-13 (×3): 1000 [IU] via INTRAVENOUS_CENTRAL
  Filled 2020-03-12: qty 1000

## 2020-03-12 MED ORDER — SODIUM CHLORIDE 0.9 % IV SOLN
250.0000 [IU]/h | INTRAVENOUS | Status: DC
  Administered 2020-03-12: 250 [IU]/h via INTRAVENOUS_CENTRAL
  Administered 2020-03-13: 1050 [IU]/h via INTRAVENOUS_CENTRAL
  Filled 2020-03-12 (×3): qty 2

## 2020-03-12 MED ORDER — OXYCODONE-ACETAMINOPHEN 5-325 MG PO TABS
1.0000 | ORAL_TABLET | Freq: Four times a day (QID) | ORAL | Status: DC | PRN
  Administered 2020-03-12 – 2020-03-26 (×34): 1 via ORAL
  Filled 2020-03-12 (×34): qty 1

## 2020-03-12 MED ORDER — CEPHALEXIN 250 MG PO CAPS
250.0000 mg | ORAL_CAPSULE | Freq: Two times a day (BID) | ORAL | Status: DC
  Administered 2020-03-12 – 2020-03-20 (×17): 250 mg via ORAL
  Filled 2020-03-12 (×19): qty 1

## 2020-03-12 MED ORDER — ASPIRIN 81 MG PO CHEW
81.0000 mg | CHEWABLE_TABLET | Freq: Every day | ORAL | Status: DC
  Administered 2020-03-13 – 2020-03-23 (×10): 81 mg via ORAL
  Filled 2020-03-12 (×11): qty 1

## 2020-03-12 MED ORDER — DEXTROSE 5 % IN LACTATED RINGERS IV BOLUS
1000.0000 mL | Freq: Once | INTRAVENOUS | Status: AC
  Administered 2020-03-12: 1000 mL via INTRAVENOUS

## 2020-03-12 NOTE — Progress Notes (Signed)
  Speech Language Pathology Treatment: Dysphagia  Patient Details Name: Max Pittman. MRN: [id number] DOB: 06-Aug-1975 Today's Date: 03/12/2020 Time: [id number] SLP Time Calculation (min) (ACUTE ONLY): 15 min  Assessment / Plan / Recommendation Clinical Impression  Pt demonstrates improved attention and clear vocal quality today. He is coughing frequently at baseline and continues to cough after almost every bite or sip of PO given. Chin tuck does not prevent cough. Cough is strong and productive. Pt is able to expectorate secretions. Pt is still on CRRT. Recommend initiating pudding/puree from floor stock, meds whole in pudding. He can also have ice chips or small sips of water. May need one more day of recovery prior to diet. MBS to be attempted tomorrow if needed when pt off of CRRT.   HPI HPI: Max Pittman. is a 45 y.o. male who has a PMH including but not limited to ESRD on PD, DM, NSTEMI, HTN, DVT on warfarin, OSA  (see "past medical history" for rest).  During early AM hours 2/10, he had worsening dyspnea at home.  Had apparently been experiencing dyspnea and trying to manage it with home dialysis.  EMS was called and he initially refused transport to ED; however, after their arrival, he had a cardiac arrest.  Initial rhythm was asystole and he required 12 minutes before ROSC.Intubated 2/10, self extubated at 2/14 at midnight.      SLP Plan  Continue with current plan of care       Recommendations  Diet recommendations: Other(comment) (pudding/puree from floor stock, ice chips) Supervision: Patient able to self feed                Oral Care Recommendations: Oral care QID Follow up Recommendations: Inpatient Rehab SLP Visit Diagnosis: Dysphagia, oropharyngeal phase (R13.12) Plan: Continue with current plan of care       GO              Herbie [place], MA [place] Pager [phone] Office [phone]   Lynann Beaver 03/12/2020, 9:10 AM

## 2020-03-12 NOTE — Progress Notes (Signed)
[facility] for IV heparin Indication: Hx PE, Chronic DVT  Allergies  Allergen Reactions  . Doxycycline Hives  . Latex Swelling    Pt reports swelling at site.   . Morphine And Related Anxiety    Patient Measurements: Height: '5\' 9"'$  (175.3 cm) Weight: 133 kg (293 lb 3.4 oz) IBW/kg (Calculated) : 70.7 Heparin Dosing Weight: 95 kg  Vital Signs: Temp: 98.9 F (37.2 C) (02/15 1100) Temp Source: Axillary (02/15 1100) BP: 146/100 (02/15 1300) Pulse Rate: 100 (02/15 1300)  Labs: Recent Labs    03/10/20 0420 03/10/20 0757 03/10/20 1154 03/10/20 1543 03/11/20 0347 03/11/20 0352 03/11/20 0438 03/11/20 1643 03/12/20 0038 03/12/20 1159  HGB 7.7*  --   --    < > 8.5* 9.2*  --   --  8.6*  --   HCT 24.2*  --   --    < > 28.2* 27.0*  --   --  28.0*  --   PLT 148*  --   --   --  155  --   --   --  169  --   APTT  --  >200* 116*  --   --   --   --   --   --   --   LABPROT  --  24.6*  --   --  15.1  --   --   --  14.5  --   INR  --  2.3*  --   --  1.2  --   --   --  1.2  --   HEPARINUNFRC  --  >2.20* 0.33  --  0.39  --  0.42  --  0.19* 0.21*  CREATININE 2.79*  --   --    < > 2.18*  --   --  2.18* 2.27*  --    < > = values in this interval not displayed.    Estimated Creatinine Clearance: 56.7 mL/min (A) (by C-G formula based on SCr of 2.27 mg/dL (H)).   Assessment: 55 yoM on warfarin PTA for hx PE and chronic DVTs admitted s/p cardiac arrest. Warfarin held, heparin started 2/12 with INR <2.  Heparin level still below goal at 0.21, INR normalized. Hgb down to 8.6 this morning. No bleeding issues noted.   Goal of Therapy:  Heparin level 0.3-0.7 units/ml Monitor platelets by anticoagulation protocol: Yes   Plan:  Increase heparin to 2500 units/h Daily heparin level and CBC   Erin Hearing PharmD., BCPS Clinical Pharmacist 03/12/2020 1:50 PM

## 2020-03-12 NOTE — Progress Notes (Signed)
Dr. Tamala Julian paged r/t pt complaints of L hip pain. Pt has this pain at home, takes percocet 5/'325mg'$ . Pt states pain level r/t hip is never lower than 6 at home. Dr. Tamala Julian gave VO to order home dose of percocet q 6hr PRN.

## 2020-03-12 NOTE — Evaluation (Signed)
Occupational Therapy Evaluation Patient Details Name: Max Pittman. MRN: [id number] DOB: 1975-06-05 Today's Date: 03/12/2020    History of Present Illness Max Pittman. is a 45 y.o. male who during early AM hours 2/10,  had worsening dyspnea at home.  Had apparently been experiencing dyspnea and trying to manage it with home peritoneal dialysis.  EMS was called and he initially refused transport to ED; however, after their arrival, he had a cardiac arrest.  Initial rhythm was asystole and he required 12 minutes before ROSC. Pt intubated 2/10, self extubated at 2/14 at midnight.  Also on CRRT.  PMH: including but not limited to ESRD on PD, DM, NSTEMI, HTN, DVT on warfarin, OSA.   Clinical Impression   PTA, pt was living with his girlfriend and was independent with BADLs and using a RW. Pt currently requiring Max A for UB ADLs, Max-Total A for LB ADLs, and Mod A +2 for functional transfers. Pt presenting with decreased balance, strength, cognition, and activity tolerance. VSS on RA. Pt would benefit from further acute OT to facilitate safe dc. Recommend dc to SNF for further OT to optimize safety, independence with ADLs, and return to PLOF.     Follow Up Recommendations  SNF    Equipment Recommendations  3 in 1 bedside commode;Wheelchair (measurements OT);Wheelchair cushion (measurements OT)    Recommendations for Other Services PT consult     Precautions / Restrictions Precautions Precautions: Fall Precaution Comments: CRRT neck access      Mobility Bed Mobility Overal bed mobility: Needs Assistance Bed Mobility: Supine to Sit;Sit to Supine     Supine to sit: Min assist;HOB elevated Sit to supine: Min assist   General bed mobility comments: Min A to elevate trunk. Pt bringing BLEs towards EOB and then using bed rails to pull towarsds EOB. Min A for managing LLE in return to supine    Transfers Overall transfer level: Needs assistance Equipment used: 2 person hand  held assist Transfers: Sit to/from Stand Sit to Stand: Mod assist;+2 physical assistance;+2 safety/equipment;From elevated surface         General transfer comment: Mod A +2 to power up and gain balance. Max cues on positioning. Bilateral knees blocked. Fatigues very quickly    Balance Overall balance assessment: Needs assistance Sitting-balance support: No upper extremity supported;Feet supported Sitting balance-Leahy Scale: Fair     Standing balance support: Bilateral upper extremity supported;During functional activity Standing balance-Leahy Scale: Poor Standing balance comment: reliant on physical A                           ADL either performed or assessed with clinical judgement   ADL Overall ADL's : Needs assistance/impaired Eating/Feeding: Set up;Bed level;Supervision/ safety   Grooming: Supervision/safety;Set up;Bed level   Upper Body Bathing: Maximal assistance;Sitting   Lower Body Bathing: Maximal assistance;Sit to/from stand   Upper Body Dressing : Maximal assistance   Lower Body Dressing: Total assistance;Bed level;Sit to/from stand   Toilet Transfer: +2 for safety/equipment;+2 for physical assistance;Moderate assistance (simulated in room) Toilet Transfer Details (indicate cue type and reason): Mod A +2 for safety and power up. blocking of knees. Poor activity tolerance         Functional mobility during ADLs: Moderate assistance;+2 for physical assistance;+2 for safety/equipment General ADL Comments: Pt performing bed mobility to sit at EOB and then three side steps towards HOB. Fatigues quickly     Vision  Perception     Praxis      Pertinent Vitals/Pain Pain Assessment: Faces Faces Pain Scale: Hurts little more Pain Location: Bilateral hips Pain Descriptors / Indicators: Grimacing;Guarding;Aching Pain Intervention(s): Monitored during session;Limited activity within patient's tolerance;Repositioned     Hand Dominance  Right   Extremity/Trunk Assessment Upper Extremity Assessment Upper Extremity Assessment: Generalized weakness   Lower Extremity Assessment Lower Extremity Assessment: Defer to PT evaluation   Cervical / Trunk Assessment Cervical / Trunk Assessment: Other exceptions Cervical / Trunk Exceptions: Increased body habitous   Communication Communication Communication: No difficulties   Cognition Arousal/Alertness: Awake/alert Behavior During Therapy: Flat affect Overall Cognitive Status: Impaired/Different from baseline Area of Impairment: Orientation;Safety/judgement;Memory;Following commands;Awareness;Problem solving                 Orientation Level: Disoriented to;Situation   Memory: Decreased short-term memory Following Commands: Follows one step commands with increased time;Follows multi-step commands inconsistently Safety/Judgement: Decreased awareness of safety;Decreased awareness of deficits Awareness: Intellectual Problem Solving: Slow processing;Requires verbal cues;Difficulty sequencing General Comments: tPt following simple commands with increased time. Slow to process and inconsistent when providing home information.   General Comments  VSS on RA    Exercises Exercises: General Lower Extremity General Exercises - Lower Extremity Long Arc Quad: AROM;Both;10 reps;Seated   Shoulder Instructions      Home Living Family/patient expects to be discharged to:: Private residence Living Arrangements: Spouse/significant other Available Help at Discharge: Available 24 hours/day (girlfriend, [name]) Type of Home: Mobile home Home Access: Stairs to enter [employer] of Steps: 3 Entrance Stairs-Rails: Left Home Layout: One level     Bathroom Shower/Tub: Teacher, early years/pre: Standard     Home Equipment: Environmental consultant - 2 wheels;Bedside commode;Adaptive equipment Adaptive Equipment: Reacher;Sock aid Additional Comments: Pt inconsistent when  providing information about home      Prior Functioning/Environment Level of Independence: Needs assistance  Gait / Transfers Assistance Needed: pt reports ambulating with RW at home ADL's / Homemaking Assistance Needed: Able to perform ADLs with some difficulty using AE. Girlfriend performs IADLs. Was a truck driver   Comments: Inconsistent when providing information. Poor historian and requiring increased time.        OT Problem List: Decreased strength;Decreased range of motion;Decreased activity tolerance;Impaired balance (sitting and/or standing);Decreased cognition;Decreased safety awareness;Decreased knowledge of use of DME or AE;Decreased knowledge of precautions;Pain      OT Treatment/Interventions: Self-care/ADL training;Therapeutic exercise;Energy conservation;DME and/or AE instruction;Therapeutic activities;Patient/family education    OT Goals(Current goals can be found in the care plan section) Acute Rehab OT Goals Patient Stated Goal: Get back home to [name] OT Goal Formulation: With patient Time For Goal Achievement: 03/26/20 Potential to Achieve Goals: Good  OT Frequency: Min 2X/week   Barriers to D/C:            Co-evaluation              AM-PAC OT "6 Clicks" Daily Activity     Outcome Measure Help from another person eating meals?: A Little Help from another person taking care of personal grooming?: A Little Help from another person toileting, which includes using toliet, bedpan, or urinal?: A Lot Help from another person bathing (including washing, rinsing, drying)?: A Lot Help from another person to put on and taking off regular upper body clothing?: A Lot Help from another person to put on and taking off regular lower body clothing?: Total 6 Click Score: 13   End of Session Nurse Communication: Mobility status  Activity Tolerance:  Patient tolerated treatment well;Patient limited by fatigue Patient left: in bed;with call bell/phone within  reach;with bed alarm set  OT Visit Diagnosis: Unsteadiness on feet (R26.81);Other abnormalities of gait and mobility (R26.89);Muscle weakness (generalized) (M62.81);Pain Pain - Right/Left:  (bilateral) Pain - part of body: Hip                Time: [id number] OT Time Calculation (min): 18 min Charges:  OT General Charges $OT Visit: 1 Visit OT Evaluation $OT Eval Moderate Complexity: [place], OTR/L Acute Rehab Pager: [phone] Office: [place] 03/12/2020, 2:20 PM
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
## 2020-03-12 NOTE — Progress Notes (Signed)
Patient ID: Max Chute., male   DOB: December 03, 1975, 45 y.o.   MRN: [id number] [facility] Progress Note   Assessment/ Plan:   1.  Acute hypoxic respiratory failure: Secondary to asystolic cardiac arrest likely provoked by volume overload/pulmonary edema.  He self extubated himself 2 days ago and has not required reintubation-on supplemental oxygen via nasal cannula. 2. ESRD: With significant anasarca on admission raising concerns for modality failure versus nonadherence with peritoneal dialysis.  He continues to have evidence of volume excess and I will continue CRRT for another 24 hours with the goal for 5 to 6 L ultrafiltration overnight; he is net -25 L to date and will need more aggressive supervision of peritoneal dialysis with his revised dry weight. 3. Anemia: Without overt blood loss, low hemoglobin/hematocrit noted, continue ESA. 4. CKD-MBD: Calcium level within acceptable range, off binders with depressed phosphorus secondary to CRRT losses-we will replace via IV route. 5. Nutrition: On aspiration precautions and nutrition per primary service. 6. Hypotension: Weaned off pressors, continues to maintain normal blood pressure allowing UF with CRRT.  Subjective:   Without acute events overnight, continues to tolerate CRRT without problems/pressors   Objective:   BP 128/76   Pulse (!) 108   Temp 98.1 F (36.7 C) (Oral)   Resp 15   Ht '5\' 9"'$  (1.753 m)   Wt 133 kg   SpO2 95%   BMI 43.30 kg/m   Physical Exam: Gen: Awake, alert, more conversant than he was yesterday. CVS: Pulse regular rhythm, normal rate, S1 and S2 normal Resp: Decreased breath sounds over bases, no distinct rhonchi/rales Abd: Soft, obese, nontender, PD catheter in situ Ext: 1+ pitting edema over lower extremities, 2+ edema over lower back  Labs: BMET Recent Labs  Lab 03/09/20 0401 03/09/20 2010 03/10/20 0420 03/10/20 1543 03/10/20 2034 03/10/20 2331 03/11/20 0347 03/11/20 0352  03/11/20 1643 03/12/20 0038  NA 136 134* 136 142 137 137 134* 138 135 136  K 3.9 3.7 4.0 4.5 3.9 4.0 4.0 4.1 4.5 4.4  CL 102 102 101 101  --   --  100  --  101 101  CO2 '27 26 26 24  '$ --   --  26  --  25 24  GLUCOSE 137* 117* 132* 158*  --   --  180*  --  111* 142*  BUN '12 13 15 14  '$ --   --  13  --  13 13  CREATININE 3.88* 3.03* 2.79* 2.39*  --   --  2.18*  --  2.18* 2.27*  CALCIUM 8.1* 8.1* 8.1* 8.4*  --   --  8.7*  --  8.9 9.2  PHOS 2.6 1.7* 2.0* 2.1*  --   --  2.3*  --  1.9* 1.6*   CBC Recent Labs  Lab 03/07/20 0239 03/07/20 0344 03/09/20 0420 03/10/20 0420 03/10/20 2034 03/10/20 2331 03/11/20 0347 03/11/20 0352 03/12/20 0038  WBC 10.0   < > 8.8 5.6  --   --  8.1  --  9.2  NEUTROABS 6.8  --   --   --   --   --   --   --   --   HGB 9.2*   < > 8.4* 7.7*   < > 8.8* 8.5* 9.2* 8.6*  HCT 29.9*   < > 28.0* 24.2*   < > 26.0* 28.2* 27.0* 28.0*  MCV 88.7   < > 91.5 90.3  --   --  91.9  --  90.3  PLT 209   < > 174 148*  --   --  155  --  169   < > = values in this interval not displayed.     Medications:    . albuterol  2.5 mg Nebulization TID  . aspirin  81 mg Per Tube Daily  . cephALEXin  250 mg Per Tube Q12H  . Chlorhexidine Gluconate Cloth  6 each Topical Daily  . docusate  100 mg Per Tube BID  . insulin aspart  0-20 Units Subcutaneous Q4H  . lamoTRIgine  100 mg Per Tube BID  . mouth rinse  15 mL Mouth Rinse BID  . pantoprazole (PROTONIX) IV  40 mg Intravenous QHS  . polyethylene glycol  17 g Per Tube Daily  . sodium chloride flush  10-40 mL Intracatheter Q12H   Elmarie Shiley, MD 03/12/2020, 8:07 AM

## 2020-03-12 NOTE — Progress Notes (Signed)
[place] for heparin Indication: H/O VTE  Allergies  Allergen Reactions  . Doxycycline Hives  . Latex Swelling    Pt reports swelling at site.   . Morphine And Related Anxiety    Patient Measurements: Height: '5\' 9"'$  (175.3 cm) Weight: 133 kg (293 lb 3.4 oz) IBW/kg (Calculated) : 70.7 Heparin Dosing Weight: 95 kg  Vital Signs: Temp: 98.1 F (36.7 C) (02/14 2300) Temp Source: Oral (02/14 2300) BP: 149/87 (02/15 0200) Pulse Rate: 112 (02/15 0200)  Labs: Recent Labs    03/09/20 0401 03/09/20 0420 03/10/20 0420 03/10/20 0757 03/10/20 1154 03/10/20 1543 03/11/20 0347 03/11/20 0352 03/11/20 0438 03/11/20 1643 03/12/20 0038  HGB  --    < > 7.7*  --   --    < > 8.5* 9.2*  --   --  8.6*  HCT  --    < > 24.2*  --   --    < > 28.2* 27.0*  --   --  28.0*  PLT  --    < > 148*  --   --   --  155  --   --   --  169  APTT 50*  --   --  >200* 116*  --   --   --   --   --   --   LABPROT 21.4*  --   --  24.6*  --   --  15.1  --   --   --  14.5  INR 1.9*  --   --  2.3*  --   --  1.2  --   --   --  1.2  HEPARINUNFRC  --    < >  --  >2.20* 0.33  --  0.39  --  0.42  --  0.19*  CREATININE 3.88*   < > 2.79*  --   --    < > 2.18*  --   --  2.18* 2.27*   < > = values in this interval not displayed.    Estimated Creatinine Clearance: 56.7 mL/min (A) (by C-G formula based on SCr of 2.27 mg/dL (H)).   Assessment: 45 y.o. male with h/o PE/DVT, Coumadin on hold and INR < 2, for heparin.  Heparin level subtherapeutic this morning  Goal of Therapy:  Heparin level 0.3-0.7 units/ml Monitor platelets by anticoagulation protocol: Yes   Plan:  Increase Heparin 2200 units/hr Check heparin level in 8 hours.  Phillis Knack, PharmD, BCPS  03/12/2020, 3:40 AM

## 2020-03-13 ENCOUNTER — Inpatient Hospital Stay (HOSPITAL_COMMUNITY): Payer: Medicare Other

## 2020-03-13 DIAGNOSIS — J9601 Acute respiratory failure with hypoxia: Secondary | ICD-10-CM | POA: Diagnosis not present

## 2020-03-13 LAB — MAGNESIUM: Magnesium: 2.5 mg/dL — ABNORMAL HIGH (ref 1.7–2.4)

## 2020-03-13 LAB — CBC WITH DIFFERENTIAL/PLATELET
Abs Immature Granulocytes: 1.19 10*3/uL — ABNORMAL HIGH (ref 0.00–0.07)
Basophils Absolute: 0.1 10*3/uL (ref 0.0–0.1)
Basophils Relative: 2 %
Eosinophils Absolute: 0.4 10*3/uL (ref 0.0–0.5)
Eosinophils Relative: 5 %
HCT: 28.9 % — ABNORMAL LOW (ref 39.0–52.0)
Hemoglobin: 9.1 g/dL — ABNORMAL LOW (ref 13.0–17.0)
Immature Granulocytes: 14 %
Lymphocytes Relative: 19 %
Lymphs Abs: 1.6 10*3/uL (ref 0.7–4.0)
MCH: 28.3 pg (ref 26.0–34.0)
MCHC: 31.5 g/dL (ref 30.0–36.0)
MCV: 90 fL (ref 80.0–100.0)
Monocytes Absolute: 1.1 10*3/uL — ABNORMAL HIGH (ref 0.1–1.0)
Monocytes Relative: 13 %
Neutro Abs: 4.2 10*3/uL (ref 1.7–7.7)
Neutrophils Relative %: 47 %
Platelets: 187 10*3/uL (ref 150–400)
RBC: 3.21 MIL/uL — ABNORMAL LOW (ref 4.22–5.81)
RDW: 17.4 % — ABNORMAL HIGH (ref 11.5–15.5)
WBC: 8.7 10*3/uL (ref 4.0–10.5)
nRBC: 0.6 % — ABNORMAL HIGH (ref 0.0–0.2)

## 2020-03-13 LAB — HEPARIN LEVEL (UNFRACTIONATED): Heparin Unfractionated: 0.21 IU/mL — ABNORMAL LOW (ref 0.30–0.70)

## 2020-03-13 LAB — POCT ACTIVATED CLOTTING TIME
Activated Clotting Time: 148 seconds
Activated Clotting Time: 160 seconds
Activated Clotting Time: 160 seconds
Activated Clotting Time: 160 seconds
Activated Clotting Time: 160 seconds
Activated Clotting Time: 160 seconds
Activated Clotting Time: 166 seconds
Activated Clotting Time: 172 seconds

## 2020-03-13 LAB — GLUCOSE, CAPILLARY
Glucose-Capillary: 104 mg/dL — ABNORMAL HIGH (ref 70–99)
Glucose-Capillary: 118 mg/dL — ABNORMAL HIGH (ref 70–99)
Glucose-Capillary: 128 mg/dL — ABNORMAL HIGH (ref 70–99)
Glucose-Capillary: 136 mg/dL — ABNORMAL HIGH (ref 70–99)
Glucose-Capillary: 178 mg/dL — ABNORMAL HIGH (ref 70–99)
Glucose-Capillary: 257 mg/dL — ABNORMAL HIGH (ref 70–99)
Glucose-Capillary: 98 mg/dL (ref 70–99)

## 2020-03-13 LAB — CULTURE, BLOOD (ROUTINE X 2)
Culture: NO GROWTH
Culture: NO GROWTH
Special Requests: ADEQUATE
Special Requests: ADEQUATE

## 2020-03-13 LAB — RENAL FUNCTION PANEL
Albumin: 2.2 g/dL — ABNORMAL LOW (ref 3.5–5.0)
Anion gap: 10 (ref 5–15)
BUN: 9 mg/dL (ref 6–20)
CO2: 25 mmol/L (ref 22–32)
Calcium: 9.3 mg/dL (ref 8.9–10.3)
Chloride: 100 mmol/L (ref 98–111)
Creatinine, Ser: 1.83 mg/dL — ABNORMAL HIGH (ref 0.61–1.24)
GFR, Estimated: 46 mL/min — ABNORMAL LOW (ref >60)
Glucose, Bld: 138 mg/dL — ABNORMAL HIGH (ref 70–99)
Phosphorus: 2.2 mg/dL — ABNORMAL LOW (ref 2.5–4.6)
Potassium: 4.3 mmol/L (ref 3.5–5.1)
Sodium: 135 mmol/L (ref 135–145)

## 2020-03-13 LAB — CUP PACEART REMOTE DEVICE CHECK
Date Time Interrogation Session: 20220212202051
Implantable Pulse Generator Implant Date: 20210429

## 2020-03-13 LAB — PROTIME-INR
INR: 1.1 (ref 0.8–1.2)
Prothrombin Time: 13.9 seconds (ref 11.4–15.2)

## 2020-03-13 LAB — APTT: aPTT: 38 seconds — ABNORMAL HIGH (ref 24–36)

## 2020-03-13 IMAGING — DX DG CHEST 1V PORT
1 series · 1 of 1 positions shown · non-contrast
Comparison: [DATE]

CLINICAL DATA: Respiratory failure

EXAM:
PORTABLE CHEST 1 VIEW

[chest ap]
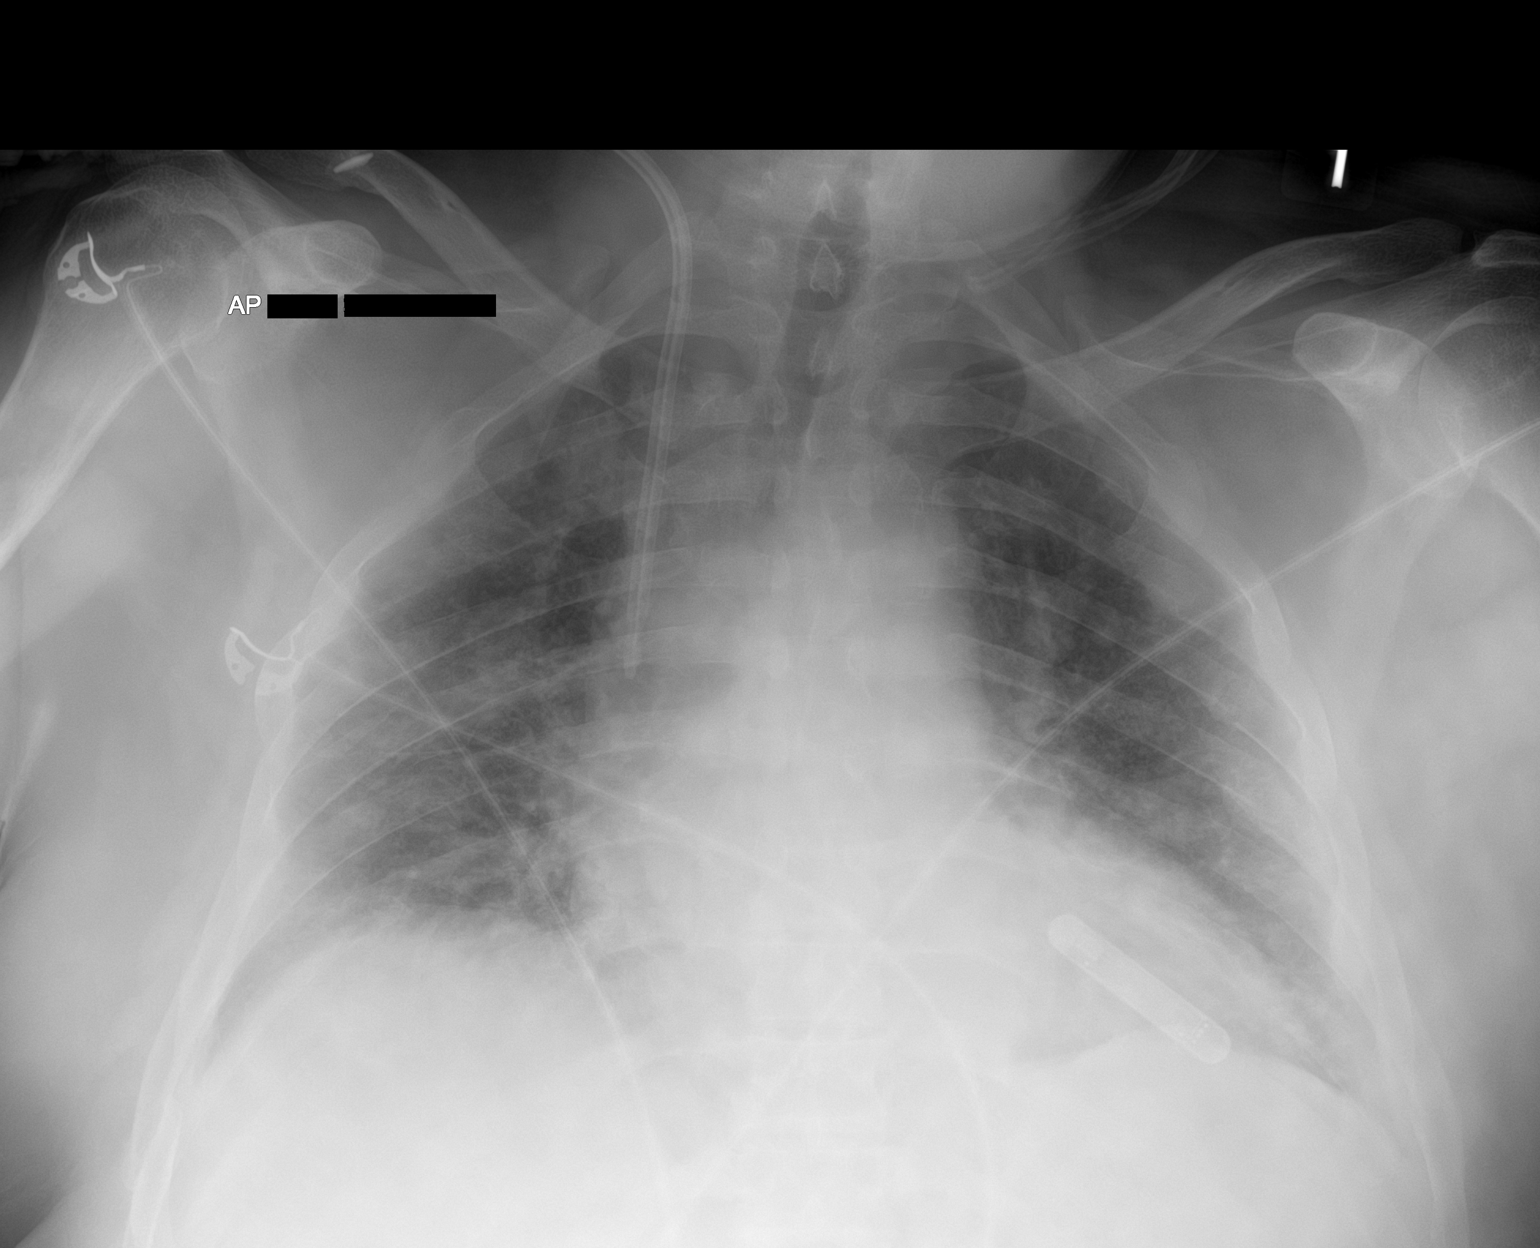

[1 of 1 positions shown; findings below may reference images not displayed]

FINDINGS: Cardiomegaly. Vascular congestion and bilateral airspace disease.
Airspace disease is improved since prior study. No visible effusions
or pneumothorax. Right dialysis catheter remains in place,
unchanged.
IMPRESSION: Improving bilateral edema or infection.

Cardiomegaly, vascular congestion

## 2020-03-13 MED ORDER — AMLODIPINE BESYLATE 5 MG PO TABS
5.0000 mg | ORAL_TABLET | Freq: Every day | ORAL | Status: DC
  Administered 2020-03-13 – 2020-03-25 (×13): 5 mg via ORAL
  Filled 2020-03-13 (×13): qty 1

## 2020-03-13 MED ORDER — HEPARIN (PORCINE) 25000 UT/250ML-% IV SOLN
2800.0000 [IU]/h | INTRAVENOUS | Status: DC
  Administered 2020-03-13: 2500 [IU]/h via INTRAVENOUS
  Administered 2020-03-14 (×2): 3200 [IU]/h via INTRAVENOUS
  Administered 2020-03-15: 2800 [IU]/h via INTRAVENOUS
  Filled 2020-03-13 (×10): qty 250

## 2020-03-13 MED ORDER — FENTANYL CITRATE (PF) 100 MCG/2ML IJ SOLN
25.0000 ug | Freq: Once | INTRAMUSCULAR | Status: AC
  Administered 2020-03-13: 25 ug via INTRAVENOUS
  Filled 2020-03-13: qty 2

## 2020-03-13 MED ORDER — ENSURE ENLIVE PO LIQD
237.0000 mL | Freq: Two times a day (BID) | ORAL | Status: DC
  Administered 2020-03-13 – 2020-03-25 (×12): 237 mL via ORAL

## 2020-03-13 MED ORDER — HYDRALAZINE HCL 20 MG/ML IJ SOLN
10.0000 mg | INTRAMUSCULAR | Status: DC | PRN
  Administered 2020-03-13 – 2020-03-18 (×2): 10 mg via INTRAVENOUS
  Filled 2020-03-13 (×2): qty 1

## 2020-03-13 MED ORDER — ALBUTEROL SULFATE (2.5 MG/3ML) 0.083% IN NEBU: 2.5000 mg | INHALATION_SOLUTION | RESPIRATORY_TRACT | Status: DC | PRN

## 2020-03-13 MED ORDER — ATORVASTATIN CALCIUM 40 MG PO TABS
40.0000 mg | ORAL_TABLET | Freq: Every day | ORAL | Status: DC
  Administered 2020-03-13 – 2020-03-25 (×13): 40 mg via ORAL
  Filled 2020-03-13 (×13): qty 1

## 2020-03-13 MED ORDER — ALBUTEROL SULFATE (2.5 MG/3ML) 0.083% IN NEBU
2.5000 mg | INHALATION_SOLUTION | Freq: Two times a day (BID) | RESPIRATORY_TRACT | Status: DC
  Administered 2020-03-13 – 2020-03-18 (×10): 2.5 mg via RESPIRATORY_TRACT
  Filled 2020-03-13 (×10): qty 3

## 2020-03-13 MED ORDER — PROSOURCE PLUS PO LIQD
30.0000 mL | Freq: Two times a day (BID) | ORAL | Status: DC
  Administered 2020-03-14 – 2020-03-25 (×14): 30 mL via ORAL
  Filled 2020-03-13 (×11): qty 30

## 2020-03-13 MED ORDER — MELATONIN 5 MG PO TABS
5.0000 mg | ORAL_TABLET | Freq: Every day | ORAL | Status: DC
  Administered 2020-03-13 – 2020-03-25 (×13): 5 mg via ORAL
  Filled 2020-03-13 (×13): qty 1

## 2020-03-13 MED ORDER — RENA-VITE PO TABS
1.0000 | ORAL_TABLET | Freq: Every day | ORAL | Status: DC
  Administered 2020-03-13 – 2020-03-25 (×13): 1 via ORAL
  Filled 2020-03-13 (×13): qty 1

## 2020-03-13 MED ORDER — BUPROPION HCL ER (SR) 150 MG PO TB12
150.0000 mg | ORAL_TABLET | Freq: Two times a day (BID) | ORAL | Status: DC
  Administered 2020-03-13 – 2020-03-26 (×24): 150 mg via ORAL
  Filled 2020-03-13 (×24): qty 1

## 2020-03-13 MED ORDER — METOPROLOL TARTRATE 12.5 MG HALF TABLET
12.5000 mg | ORAL_TABLET | Freq: Two times a day (BID) | ORAL | Status: DC
  Administered 2020-03-13 – 2020-03-25 (×24): 12.5 mg via ORAL
  Filled 2020-03-13 (×24): qty 1

## 2020-03-13 NOTE — Procedures (Signed)
Objective Swallowing Evaluation: Type of Study: FEES-Fiberoptic Endoscopic Evaluation of Swallow   Patient Details  Name: Max Pittman. MRN: [id number] Date of Birth: 1975/09/02  Today's Date: 03/13/2020 Time: SLP Start Time (ACUTE ONLY): 0900 -SLP Stop Time (ACUTE ONLY): 0940  SLP Time Calculation (min) (ACUTE ONLY): 40 min   Past Medical History:  Past Medical History:  Diagnosis Date  . Arthritis   . Closed dislocation of left hip ([place]) 05/21/2019  . Diabetes ([name])   . Diabetes mellitus without complication ([name])   . DVT (deep venous thrombosis) ([place])   . History of peritoneal dialysis   . Hypertension   . Renal disorder    FFGS  . Renal disorder   . Sleep apnea   . Stroke [facility])    when ha was a child  . Vasovagal syndrome    with syncope   Past Surgical History:  Past Surgical History:  Procedure Laterality Date  . AMPUTATION Right 07/21/2019   Procedure: Right index finger amputation as necessary at distal interphalangeal joint;  Surgeon: Roseanne Kaufman, MD;  Location: [place];  Service: Orthopedics;  Laterality: Right;  60 mins  . APPLICATION OF WOUND VAC Left 05/22/2019   Procedure: APPLICATION OF WOUND VAC  LEFT HIP;  Surgeon: Altamese Sedgwick, MD;  Location: [place];  Service: Orthopedics;  Laterality: Left;  . APPLICATION OF WOUND VAC Left 07/17/2019   Procedure: WOUND VAC CHANGE;  Surgeon: Altamese Nescatunga, MD;  Location: [place];  Service: Orthopedics;  Laterality: Left;  . AV FISTULA INSERTION W/ RF MAGNETIC GUIDANCE Left   . AV FISTULA PLACEMENT    . BUBBLE STUDY  05/25/2019   Procedure: BUBBLE STUDY;  Surgeon: Dorothy Spark, MD;  Location: [place];  Service: Cardiovascular;;  . CHOLECYSTECTOMY    . HERNIA REPAIR    . HIP CLOSED REDUCTION Left 05/20/2019   Procedure: CLOSED REDUCTION HIP WITH TRACTION PIN APPLICATION;  Surgeon: Altamese [place], MD;  Location: [place];  Service: Orthopedics;  Laterality: Left;  . INCISION AND DRAINAGE HIP Left 07/14/2019    Procedure: IRRIGATION AND DEBRIDEMENT HIP;  Surgeon: Altamese [place], MD;  Location: [place];  Service: Orthopedics;  Laterality: Left;  . INCISION AND DRAINAGE HIP Left 07/17/2019   Procedure: REPEAT IRRIGATION AND DEBRIDEMENT LEFT HIP;  Surgeon: Altamese Sampson, MD;  Location: [name];  Service: Orthopedics;  Laterality: Left;  . IR FLUORO GUIDE CV LINE RIGHT  05/23/2019  . IR FLUORO GUIDE CV LINE RIGHT  07/21/2019  . IR REMOVAL TUN CV CATH W/O FL  08/29/2019  . IR US GUIDE VASC ACCESS RIGHT  05/23/2019  . IR US GUIDE VASC ACCESS RIGHT  07/21/2019  . LEFT HEART CATH AND CORONARY ANGIOGRAPHY N/A 03/22/2019   Procedure: LEFT HEART CATH AND CORONARY ANGIOGRAPHY;  Surgeon: Wellington Hampshire, MD;  Location: [place] CV LAB;  Service: Cardiovascular;  Laterality: N/A;  . LOOP RECORDER INSERTION N/A 05/25/2019   Procedure: LOOP RECORDER INSERTION;  Surgeon: [name], MD;  Location: [place] CV LAB;  Service: Cardiovascular;  Laterality: N/A;  . ORIF ACETABULAR FRACTURE Left 05/22/2019   Procedure: OPEN REDUCTION INTERNAL FIXATION (ORIF) T TYPE  WITH ASSOCIATED POSTERIOR WALL ACETABULAR FRACTURE, LEFT; REMOVAL OF TRACTION PIN LEFT TIBIA;  Surgeon: Altamese [place], MD;  Location: [place];  Service: Orthopedics;  Laterality: Left;  . REMOVAL OF A DIALYSIS CATHETER Right 07/17/2019   Procedure: REMOVAL OF HEMODIALYSIS DIALYSIS CATHETER;  Surgeon: Altamese , MD;  Location: [place];  Service:  Orthopedics;  Laterality: Right;  . TEE WITHOUT CARDIOVERSION N/A 05/25/2019   Procedure: TRANSESOPHAGEAL ECHOCARDIOGRAM (TEE);  Surgeon: Dorothy Spark, MD;  Location: [facility] ENDOSCOPY;  Service: Cardiovascular;  Laterality: N/A;  . TEE WITHOUT CARDIOVERSION N/A 07/19/2019   Procedure: TRANSESOPHAGEAL ECHOCARDIOGRAM (TEE);  Surgeon: Sueanne Margarita, MD;  Location: [facility] ENDOSCOPY;  Service: Cardiovascular;  Laterality: N/A;  . TONSILLECTOMY     HPI: Max Pittman. is a 45 y.o. male who has a PMH including but not limited  to ESRD on PD, DM, NSTEMI, HTN, DVT on warfarin, OSA  (see "past medical history" for rest).  During early AM hours 2/10, he had worsening dyspnea at home.  Had apparently been experiencing dyspnea and trying to manage it with home dialysis.  EMS was called and he initially refused transport to ED; however, after their arrival, he had a cardiac arrest.  Initial rhythm was asystole and he required 12 minutes before ROSC.Intubated 2/10, self extubated at 2/14 at midnight.   No data recorded   Assessment / Plan / Recommendation  CHL IP CLINICAL IMPRESSIONS 03/13/2020  Clinical Impression Pt demonstrates a mild oropharygneal dysphagia with instances of flash frank aspiation due to delayed laryngeal closure and epiglottic deflection before the swallow. Pt alert and aware, when cued to hold bolus orally and tuck chin with sips, he also completed a breath hold, achieving full glottic closure before the swallow. Subsequently, swallow initaition was more timely and airway protection complete. Pt carried this strategy over to 100% of trials with all textures with intermittent verbal reinforcement. Recommend pt initiate a regular diet and thin liquids with f/u to reinforce swallowing strategies as needed.  SLP Visit Diagnosis Dysphagia, oropharyngeal phase (R13.12)  Attention and concentration deficit following --  Frontal lobe and executive function deficit following --  Impact on safety and function Mild aspiration risk      CHL IP TREATMENT RECOMMENDATION 03/13/2020  Treatment Recommendations Therapy as outlined in treatment plan below     Prognosis 03/13/2020  Prognosis for Safe Diet Advancement Good  Barriers to Reach Goals --  Barriers/Prognosis Comment --    CHL IP DIET RECOMMENDATION 03/13/2020  SLP Diet Recommendations Regular solids;Thin liquid  Liquid Administration via Cup;Straw  Medication Administration Whole meds with puree  Compensations Slow rate;Small sips/bites;Chin tuck  Postural  Changes Seated upright at 90 degrees      No flowsheet data found.    CHL IP FOLLOW UP RECOMMENDATIONS 03/13/2020  Follow up Recommendations Inpatient Rehab      CHL IP FREQUENCY AND DURATION 03/13/2020  Speech Therapy Frequency (ACUTE ONLY) min 2x/week  Treatment Duration 2 weeks           CHL IP ORAL PHASE 03/13/2020  Oral Phase WFL  Oral - Pudding Teaspoon --  Oral - Pudding Cup --  Oral - Honey Teaspoon --  Oral - Honey Cup --  Oral - Nectar Teaspoon --  Oral - Nectar Cup --  Oral - Nectar Straw --  Oral - Thin Teaspoon --  Oral - Thin Cup --  Oral - Thin Straw --  Oral - Puree --  Oral - Mech Soft --  Oral - Regular --  Oral - Multi-Consistency --  Oral - Pill --  Oral Phase - Comment --    CHL IP PHARYNGEAL PHASE 03/13/2020  Pharyngeal Phase Impaired  Pharyngeal- Pudding Teaspoon --  Pharyngeal --  Pharyngeal- Pudding Cup --  Pharyngeal --  Pharyngeal- Honey Teaspoon --  Pharyngeal --  Pharyngeal- Honey Cup --  Pharyngeal --  Pharyngeal- Nectar Teaspoon --  Pharyngeal --  Pharyngeal- Nectar Cup --  Pharyngeal --  Pharyngeal- Nectar Straw --  Pharyngeal --  Pharyngeal- Thin Teaspoon --  Pharyngeal --  Pharyngeal- Thin Cup NT  Pharyngeal --  Pharyngeal- Thin Straw Delayed swallow initiation-pyriform sinuses;Penetration/Aspiration before swallow  Pharyngeal Material enters airway, CONTACTS cords and then ejected out;Material does not enter airway  Pharyngeal- Puree WFL  Pharyngeal --  Pharyngeal- Mechanical Soft --  Pharyngeal --  Pharyngeal- Regular WFL  Pharyngeal --  Pharyngeal- Multi-consistency --  Pharyngeal --  Pharyngeal- Pill --  Pharyngeal --  Pharyngeal Comment --     No flowsheet data found.  Max Baltimore, MA CCC-SLP  Acute Rehabilitation Services Pager [phone] Office [phone]  Lynann Beaver 03/13/2020, 11:09 AM

## 2020-03-13 NOTE — Progress Notes (Signed)
[place] for IV heparin Indication: Hx PE, Chronic DVT  Allergies  Allergen Reactions  . Doxycycline Hives  . Latex Swelling    Pt reports swelling at site.   . Morphine And Related Anxiety    Patient Measurements: Height: '5\' 9"'$  (175.3 cm) Weight: 122 kg (268 lb 15.4 oz) IBW/kg (Calculated) : 70.7 Heparin Dosing Weight: 95 kg  Vital Signs: Temp: 98.1 F (36.7 C) (02/16 0730) Temp Source: Oral (02/16 0730) BP: 154/93 (02/16 0900) Pulse Rate: 90 (02/16 0900)  Labs: Recent Labs    03/10/20 1154 03/10/20 1543 03/11/20 0347 03/11/20 0352 03/11/20 0438 03/12/20 0038 03/12/20 1159 03/12/20 1600 03/12/20 2136 03/13/20 0351  HGB  --    < > 8.5* 9.2*  --  8.6*  --   --   --  9.1*  HCT  --    < > 28.2* 27.0*  --  28.0*  --   --   --  28.9*  PLT  --   --  155  --   --  169  --   --   --  187  APTT 116*  --   --   --   --   --   --   --   --  38*  LABPROT  --   --  15.1  --   --  14.5  --   --   --  13.9  INR  --   --  1.2  --   --  1.2  --   --   --  1.1  HEPARINUNFRC 0.33  --  0.39  --    < > 0.19* 0.21*  --  <0.10*  --   CREATININE  --    < > 2.18*  --    < > 2.27*  --  2.20*  --  1.83*   < > = values in this interval not displayed.    Estimated Creatinine Clearance: 67.1 mL/min (A) (by C-G formula based on SCr of 1.83 mg/dL (H)).   Assessment: 59 yoM on warfarin PTA for hx PE and chronic DVTs admitted s/p cardiac arrest. Warfarin held, heparin started 2/12 with INR <2.  Heparin off yesterday, will restart today. Holding off on restarting warfarin for another day. Hgb stable 9s, plt wnl. No overt bleeding noted. INR normal at 1. Renal stopping CRRT today and plan to transition to CCPD  Goal of Therapy:  Heparin level 0.3-0.7 units/ml Monitor platelets by anticoagulation protocol: Yes   Plan:  Restart heparin at 2500 units/h Check 8 hour heparin level Daily heparin level and CBC   Erin Hearing PharmD., BCPS Clinical  Pharmacist 03/13/2020 10:22 AM

## 2020-03-13 NOTE — Progress Notes (Signed)
[place] for IV Heparin Indication: Hx PE, Chronic DVT  Allergies  Allergen Reactions  . Doxycycline Hives  . Latex Swelling    Pt reports swelling at site.   . Morphine And Related Anxiety    Patient Measurements: Height: '5\' 9"'$  (175.3 cm) Weight: 122 kg (268 lb 15.4 oz) IBW/kg (Calculated) : 70.7 Heparin Dosing Weight: 95 kg  Vital Signs: Temp: 98.2 F (36.8 C) (02/16 1551) Temp Source: Oral (02/16 1551) BP: 133/79 (02/16 1200) Pulse Rate: 96 (02/16 1200)  Labs: Recent Labs    03/11/20 0347 03/11/20 0352 03/11/20 0438 03/12/20 0038 03/12/20 1159 03/12/20 1600 03/12/20 2136 03/13/20 0351 03/13/20 1808  HGB 8.5* 9.2*  --  8.6*  --   --   --  9.1*  --   HCT 28.2* 27.0*  --  28.0*  --   --   --  28.9*  --   PLT 155  --   --  169  --   --   --  187  --   APTT  --   --   --   --   --   --   --  38*  --   LABPROT 15.1  --   --  14.5  --   --   --  13.9  --   INR 1.2  --   --  1.2  --   --   --  1.1  --   HEPARINUNFRC 0.39  --    < > 0.19* 0.21*  --  <0.10*  --  0.21*  CREATININE 2.18*  --    < > 2.27*  --  2.20*  --  1.83*  --    < > = values in this interval not displayed.    Estimated Creatinine Clearance: 67.1 mL/min (A) (by C-G formula based on SCr of 1.83 mg/dL (H)).  Assessment: 45 yr old male on warfarin PTA for hx PE and chronic DVTs was admitted S/P cardiac arrest. Warfarin held; IV heparin started 2/12 with INR <2.  Heparin off yesterday, restarted today at 2500 units/hr. Holding off on restarting warfarin for another day. Hgb stable 9s, plt WNL. INR today is 1.1.  Nephrology stopping CRRT today and plan to transition to CCPD.  Heparin level ~6.5 hrs after restarting heparin infusion at 2500 units/hr was 0.21 units/ml, which is below the goal range for this pt. Per RN, no issues with IV or bleeding observed.  Goal of Therapy:  Heparin level 0.3-0.7 units/ml Monitor platelets by anticoagulation protocol: Yes    Plan:  Increase heparin infusion to 2700 units/hr Check 8-hour heparin level Monitor daily heparin level and CBC  Monitor for bleeding  Gillermina Hu, PharmD, BCPS, [facility] Clinical Pharmacist 03/13/2020 6:56 PM

## 2020-03-13 NOTE — Progress Notes (Signed)
Patient ID: [name], male   DOB: [birth year], 45 y.o.   MRN: [id number] [facility] Progress Note   Assessment/ Plan:   1.  Acute hypoxic respiratory failure: Secondary to asystolic cardiac arrest likely provoked by volume overload/pulmonary edema.  Has been able to maintain his airway/oxygenation after self extubation 3 days ago with ongoing supplementation via nasal cannula. 2. ESRD: With significant anasarca on admission raising concerns for modality failure versus nonadherence with peritoneal dialysis.  Overnight net negative about 6 L (net -31 L with CRRT).  The plan is to discontinue CRRT today before his filter clots and transition back to CCPD.. 3. Anemia: Without overt blood loss, low hemoglobin/hematocrit noted, continue ESA. 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 5. Nutrition: On aspiration precautions and nutrition per primary service. 6. Hypertension: Ongoing ultrafiltration, resume oral antihypertensive agents.  Subjective:   Had an episode of CRRT filter clotting overnight requiring restarting.  Denies any chest pain or shortness of breath.   Objective:   BP (!) 190/84   Pulse 90   Temp 98.1 F (36.7 C) (Oral)   Resp 19   Ht '5\' 9"'$  (1.753 m)   Wt 122 kg   SpO2 100%   BMI 39.72 kg/m   Physical Exam: Gen: Awake, alert/engages in conversation. CVS: Pulse regular rhythm, normal rate, S1 and S2 normal Resp: Decreased breath sounds over bases, no distinct rhonchi/rales Abd: Soft, obese, nontender, PD catheter in situ Ext: Trace-1+ pitting edema over lower extremities, 1+ edema over lower back   Intake/Output Summary (Last 24 hours) at 03/13/2020 0803 Last data filed at 03/13/2020 0700 Gross per 24 hour  Intake 1244.04 ml  Output 7192 ml  Net -5947.96 ml     Labs: BMET Recent Labs  Lab 03/10/20 0420  03/10/20 1543 03/10/20 2034 03/10/20 2331 03/11/20 0347 03/11/20 0352 03/11/20 1643 03/12/20 0038 03/12/20 1600 03/13/20 0351  NA 136 142   < > 137 134* 138 135 136 134* 135  K 4.0 4.5   < > 4.0 4.0 4.1 4.5 4.4 4.1 4.3  CL 101 101  --   --  100  --  101 101 100 100  CO2 26 24  --   --  26  --  '25 24 25 25  '$ GLUCOSE 132* 158*  --   --  180*  --  111* 142* 142* 138*  BUN 15 14  --   --  13  --  '13 13 11 9  '$ CREATININE 2.79* 2.39*  --   --  2.18*  --  2.18* 2.27* 2.20* 1.83*  CALCIUM 8.1* 8.4*  --   --  8.7*  --  8.9 9.2 9.1 9.3  PHOS 2.0* 2.1*  --   --  2.3*  --  1.9* 1.6* 2.6 2.2*   < > = values in this interval not displayed.   CBC Recent Labs  Lab 03/07/20 0239 03/07/20 0344 03/10/20 0420 03/10/20 2034 03/11/20 0347 03/11/20 0352 03/12/20 0038 03/13/20 0351  WBC 10.0   < > 5.6  --  8.1  --  9.2 8.7  NEUTROABS 6.8  --   --   --   --   --   --  4.2  HGB 9.2*   < > 7.7*   < > 8.5* 9.2* 8.6* 9.1*  HCT 29.9*   < > 24.2*   < >  28.2* 27.0* 28.0* 28.9*  MCV 88.7   < > 90.3  --  91.9  --  90.3 90.0  PLT 209   < > 148*  --  155  --  169 187   < > = values in this interval not displayed.     Medications:    . albuterol  2.5 mg Nebulization TID  . aspirin  81 mg Oral Daily  . cephALEXin  250 mg Oral Q12H  . Chlorhexidine Gluconate Cloth  6 each Topical Daily  . docusate sodium  100 mg Oral BID  . insulin aspart  0-20 Units Subcutaneous Q4H  . lamoTRIgine  100 mg Oral BID  . mouth rinse  15 mL Mouth Rinse BID  . pantoprazole (PROTONIX) IV  40 mg Intravenous QHS  . polyethylene glycol  17 g Oral Daily  . sodium chloride flush  10-40 mL Intracatheter Q12H   Elmarie Shiley, MD 03/13/2020, 7:59 AM

## 2020-03-13 NOTE — Progress Notes (Signed)
eLink Physician-Brief Progress Note Patient Name: Max Pittman. DOB: [birth year] MRN: [id number]   Date of Service  03/13/2020  HPI/Events of Note  Multiple issues: 1. Hypertension - BP = 212/96 and 2. Chronic pain not relieved by Percocet.   eICU Interventions  Plan: 1. Hydralazine 10 mg IV Q 4 hours PRN SBP > 170 or DBP > 100.  2. Fentanyl 25 mcg IV X 1 now.      Intervention Category Major Interventions: Hypertension - evaluation and management;Other:  Lysle Dingwall 03/13/2020, 3:38 AM

## 2020-03-13 NOTE — Progress Notes (Signed)
Physical Therapy Treatment Patient Details Name: Max Pittman. MRN: [id number] DOB: [birth year] Today's Date: 03/13/2020    History of Present Illness Max Pittman. is a 45 y.o. male who during early AM hours 2/10,  had worsening dyspnea at home.  Had apparently been experiencing dyspnea and trying to manage it with home peritoneal dialysis.  EMS was called and he initially refused transport to ED; however, after their arrival, he had a cardiac arrest.  Initial rhythm was asystole and he required 12 minutes before ROSC. Pt intubated 2/10, self extubated at 2/14 at midnight.  Also on CRRT.  PMH: including but not limited to ESRD on PD, DM, NSTEMI, HTN, DVT on warfarin, OSA.    PT Comments    Pt is making progress towards his goals, however continues to be limited in safe mobility by decreased cognition, safety awareness in presence of historic L LE weakness, and increased overall generalized weakness. Pt is min Ax2 for bed mobility and modAx2 for transfers and short distance ambulation with RW. Based on current level of mobility and endurance, PT currently recommending SNF level rehab at discharge. PT will continue to follow acutely.   Follow Up Recommendations  SNF     Equipment Recommendations  Rolling walker with 5" wheels;3in1 (PT)       Precautions / Restrictions Precautions Precautions: Fall Precaution Comments: CRRT neck access Restrictions Weight Bearing Restrictions: No    Mobility  Bed Mobility Overal bed mobility: Needs Assistance Bed Mobility: Supine to Sit     Supine to sit: Min assist;HOB elevated     General bed mobility comments: min A for initiating LE movement towards EoB, increased cuing for scooting hips to EoB    Transfers Overall transfer level: Needs assistance Equipment used: Rolling walker (2 wheeled) Transfers: Sit to/from Stand Sit to Stand: Mod assist;+2 physical assistance         General transfer comment: modAx2 for power up  and steadying with RW  Ambulation/Gait Ambulation/Gait assistance: Mod assist;+2 physical assistance Gait Distance (Feet): 5 Feet Assistive device: Rolling walker (2 wheeled) Gait Pattern/deviations: Step-to pattern;Decreased step length - right;Decreased step length - left;Shuffle;Decreased weight shift to left;Trunk flexed Gait velocity: slowed Gait velocity interpretation: <1.31 ft/sec, indicative of household ambulator General Gait Details: modAx2 for support, increasing L LE weakness and advancement      Balance Overall balance assessment: Needs assistance Sitting-balance support: No upper extremity supported;Feet supported Sitting balance-Leahy Scale: Fair     Standing balance support: Bilateral upper extremity supported;During functional activity Standing balance-Leahy Scale: Poor Standing balance comment: reliant on physical A                            Cognition Arousal/Alertness: Awake/alert Behavior During Therapy: Flat affect Overall Cognitive Status: Impaired/Different from baseline Area of Impairment: Safety/judgement;Memory;Following commands;Awareness;Problem solving                     Memory: Decreased short-term memory Following Commands: Follows one step commands with increased time;Follows multi-step commands inconsistently Safety/Judgement: Decreased awareness of safety;Decreased awareness of deficits Awareness: Intellectual Problem Solving: Slow processing;Requires verbal cues;Difficulty sequencing General Comments: increased time with answering questions and performing tasks      Exercises General Exercises - Lower Extremity Ankle Circles/Pumps: AROM;Both;10 reps;Supine Long Arc Quad: AROM;Both;10 reps;Seated Hip Flexion/Marching: AROM;Right;AAROM;Left;10 reps;Seated Other Exercises Other Exercises: seated balance practice reaching outside Jet comments (skin integrity, edema, etc.):  VSS on RA       Pertinent Vitals/Pain Pain Assessment: Faces Faces Pain Scale: Hurts little more Pain Location: generalized LE Pain Descriptors / Indicators: Grimacing;Guarding;Aching Pain Intervention(s): Monitored during session;Repositioned           PT Goals (current goals can now be found in the care plan section) Acute Rehab PT Goals Patient Stated Goal: unable to state PT Goal Formulation: Patient unable to participate in goal setting Time For Goal Achievement: 03/25/20 Potential to Achieve Goals: Good Progress towards PT goals: Progressing toward goals    Frequency    Min 3X/week      PT Plan Discharge plan needs to be updated       AM-PAC PT "6 Clicks" Mobility   Outcome Measure  Help needed turning from your back to your side while in a flat bed without using bedrails?: A Little Help needed moving from lying on your back to sitting on the side of a flat bed without using bedrails?: A Little Help needed moving to and from a bed to a chair (including a wheelchair)?: A Lot Help needed standing up from a chair using your arms (e.g., wheelchair or bedside chair)?: A Lot Help needed to walk in hospital room?: A Lot Help needed climbing 3-5 steps with a railing? : Total 6 Click Score: 13    End of Session Equipment Utilized During Treatment: Gait belt Activity Tolerance: Patient tolerated treatment well Patient left: with call bell/phone within reach;in bed;with chair alarm set Nurse Communication: Mobility status PT Visit Diagnosis: Muscle weakness (generalized) (M62.81)     Time: [id number] PT Time Calculation (min) (ACUTE ONLY): 20 min  Charges:  $Therapeutic Exercise: 8-22 mins                     Juliette Standre B. Migdalia Dk PT, DPT Acute Rehabilitation Services Pager [phone] Office [phone]    [place] 03/13/2020, 4:07 PM
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
## 2020-03-13 NOTE — Progress Notes (Addendum)
Nutrition Follow-up  DOCUMENTATION CODES:   Morbid obesity  INTERVENTION:   Liberalize diet to REGULAR to promote po intake; monitor need for appropriate diet restrictions  Ensure Enlive po BID, each supplement provides 350 kcal and 20 grams of protein  30 ml ProSource Plus BID, each supplement provides 100 kcals and 15 grams protein.   Add Renal MVI  NUTRITION DIAGNOSIS:   Inadequate oral intake related to acute illness as evidenced by NPO status.  Being addressed via diet advancement supplements  GOAL:   Patient will meet greater than or equal to 90% of their needs  Progressing  MONITOR:   Vent status,Labs,Weight trends,TF tolerance  REASON FOR ASSESSMENT:   Ventilator,Consult Enteral/tube feeding initiation and management  ASSESSMENT:   45 yo male admitted post cardiac arrest with acute respiratory failure secondary to acute pulmonary edema requiring intubation PMH includes ESRD on PD, DM, HTN, OSA  2/10 Admitted, intubated, TTM, CRRT initiated 2/13 Self-Extubation 2/16 Diet advanced to Carb Mod  Plan to stop CRRT today and resume CCPD  FEES today with diet progression to Carb Modified with Thins, no meal intake yet.   Noted phosphorus being supplemented with potassium phosphate. Potassium wdl, sodium wdl  CBGs wdl not requiring any insulin coverage  Current wt 122 kg; admit weight 158 kg. Net negative 31.5 L per I/O flow sheet. Edema improved  Labs: phosphorus 2.2 (L), CBGs 98-128 Meds: ss novolog, colace, miralax   Diet Order:   Diet Order            Diet Carb Modified Fluid consistency: Thin; Room service appropriate? Yes  Diet effective now                 EDUCATION NEEDS:   Not appropriate for education at this time  Skin:  Skin Assessment: Reviewed RN Assessment  Last BM:  2/16  Height:   Ht Readings from Last 1 Encounters:  03/07/20 '5\' 9"'$  (1.753 m)    Weight:   Wt Readings from Last 1 Encounters:  03/13/20 122 kg     BMI:  Body mass index is 39.72 kg/m.  Estimated Nutritional Needs:   Kcal:  2200-2400 kcals  Protein:  110-140 g  Fluid:  1.8 L   Kerman Passey MS, RDN, LDN, CNSC Registered Dietitian III Clinical Nutrition RD Pager and On-Call Pager Number Located in [place]

## 2020-03-14 ENCOUNTER — Inpatient Hospital Stay (HOSPITAL_COMMUNITY): Payer: Medicare Other

## 2020-03-14 ENCOUNTER — Ambulatory Visit (INDEPENDENT_AMBULATORY_CARE_PROVIDER_SITE_OTHER): Payer: Medicare Other

## 2020-03-14 DIAGNOSIS — J9601 Acute respiratory failure with hypoxia: Secondary | ICD-10-CM | POA: Diagnosis not present

## 2020-03-14 DIAGNOSIS — N189 Chronic kidney disease, unspecified: Secondary | ICD-10-CM

## 2020-03-14 DIAGNOSIS — I469 Cardiac arrest, cause unspecified: Secondary | ICD-10-CM | POA: Diagnosis not present

## 2020-03-14 DIAGNOSIS — D631 Anemia in chronic kidney disease: Secondary | ICD-10-CM | POA: Diagnosis not present

## 2020-03-14 DIAGNOSIS — R9389 Abnormal findings on diagnostic imaging of other specified body structures: Secondary | ICD-10-CM | POA: Diagnosis not present

## 2020-03-14 LAB — CBC WITH DIFFERENTIAL/PLATELET
Abs Immature Granulocytes: 1.79 10*3/uL — ABNORMAL HIGH (ref 0.00–0.07)
Basophils Absolute: 0.1 10*3/uL (ref 0.0–0.1)
Basophils Relative: 1 %
Eosinophils Absolute: 0.5 10*3/uL (ref 0.0–0.5)
Eosinophils Relative: 4 %
HCT: 27 % — ABNORMAL LOW (ref 39.0–52.0)
Hemoglobin: 8 g/dL — ABNORMAL LOW (ref 13.0–17.0)
Immature Granulocytes: 15 %
Lymphocytes Relative: 22 %
Lymphs Abs: 2.7 10*3/uL (ref 0.7–4.0)
MCH: 27.4 pg (ref 26.0–34.0)
MCHC: 29.6 g/dL — ABNORMAL LOW (ref 30.0–36.0)
MCV: 92.5 fL (ref 80.0–100.0)
Monocytes Absolute: 1.5 10*3/uL — ABNORMAL HIGH (ref 0.1–1.0)
Monocytes Relative: 12 %
Neutro Abs: 5.6 10*3/uL (ref 1.7–7.7)
Neutrophils Relative %: 46 %
Platelets: 196 10*3/uL (ref 150–400)
RBC: 2.92 MIL/uL — ABNORMAL LOW (ref 4.22–5.81)
RDW: 17.3 % — ABNORMAL HIGH (ref 11.5–15.5)
WBC: 12.2 10*3/uL — ABNORMAL HIGH (ref 4.0–10.5)
nRBC: 0.6 % — ABNORMAL HIGH (ref 0.0–0.2)

## 2020-03-14 LAB — MAGNESIUM: Magnesium: 2.6 mg/dL — ABNORMAL HIGH (ref 1.7–2.4)

## 2020-03-14 LAB — HEPARIN LEVEL (UNFRACTIONATED)
Heparin Unfractionated: <0.1 IU/mL — ABNORMAL LOW (ref 0.30–0.70)
Heparin Unfractionated: 0.66 IU/mL (ref 0.30–0.70)
Heparin Unfractionated: >2.2 IU/mL — ABNORMAL HIGH (ref 0.30–0.70)

## 2020-03-14 LAB — RENAL FUNCTION PANEL
Albumin: 2 g/dL — ABNORMAL LOW (ref 3.5–5.0)
Anion gap: 10 (ref 5–15)
BUN: 25 mg/dL — ABNORMAL HIGH (ref 6–20)
CO2: 24 mmol/L (ref 22–32)
Calcium: 9.4 mg/dL (ref 8.9–10.3)
Chloride: 101 mmol/L (ref 98–111)
Creatinine, Ser: 3.84 mg/dL — ABNORMAL HIGH (ref 0.61–1.24)
GFR, Estimated: 19 mL/min — ABNORMAL LOW (ref >60)
Glucose, Bld: 121 mg/dL — ABNORMAL HIGH (ref 70–99)
Phosphorus: 4.6 mg/dL (ref 2.5–4.6)
Potassium: 4 mmol/L (ref 3.5–5.1)
Sodium: 135 mmol/L (ref 135–145)

## 2020-03-14 LAB — GLUCOSE, CAPILLARY
Glucose-Capillary: 113 mg/dL — ABNORMAL HIGH (ref 70–99)
Glucose-Capillary: 119 mg/dL — ABNORMAL HIGH (ref 70–99)
Glucose-Capillary: 161 mg/dL — ABNORMAL HIGH (ref 70–99)
Glucose-Capillary: 135 mg/dL — ABNORMAL HIGH (ref 70–99)
Glucose-Capillary: 261 mg/dL — ABNORMAL HIGH (ref 70–99)

## 2020-03-14 LAB — PROTIME-INR
INR: 1.1 (ref 0.8–1.2)
Prothrombin Time: 13.6 seconds (ref 11.4–15.2)

## 2020-03-14 IMAGING — DX DG CHEST 1V PORT
1 series · 1 of 1 positions shown · non-contrast
Comparison: [DATE].

CLINICAL DATA: Cardiac arrest, abnormal respiration.

EXAM:
PORTABLE CHEST 1 VIEW

[chest ap]
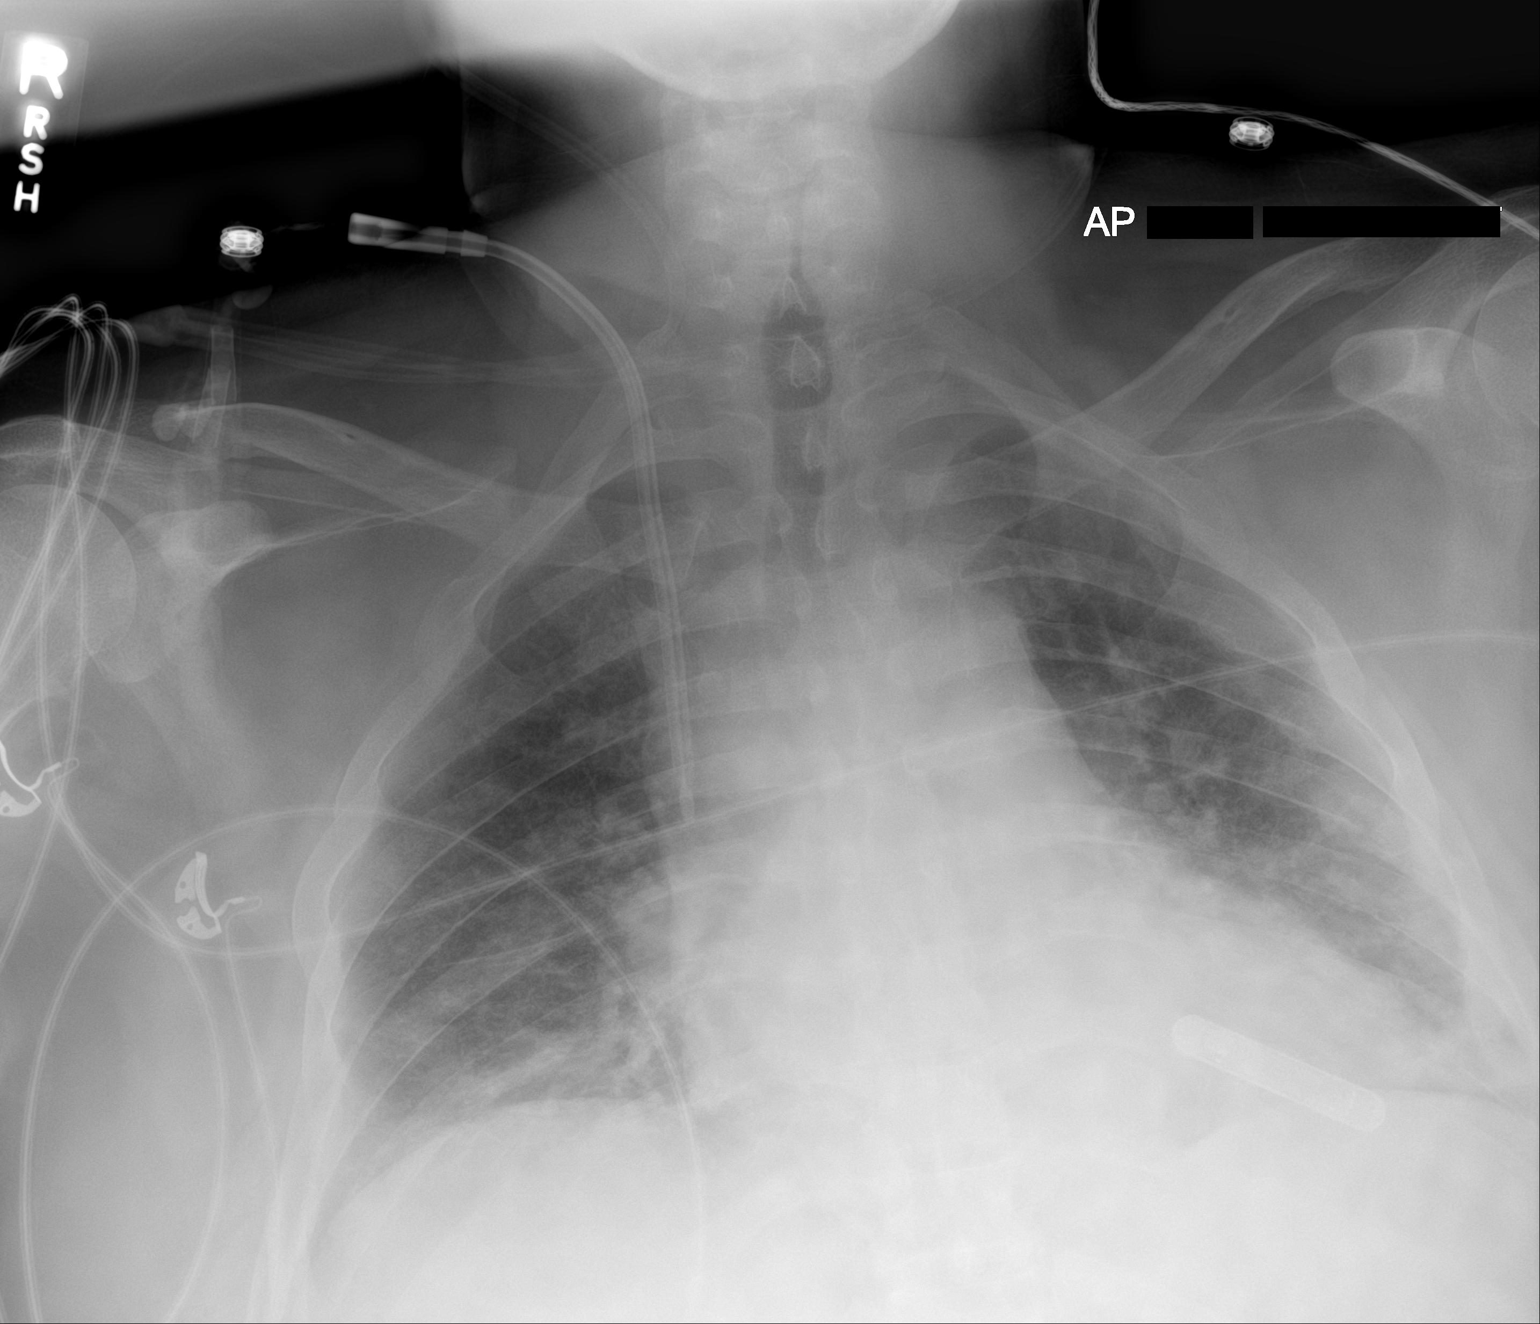

[1 of 1 positions shown; findings below may reference images not displayed]

FINDINGS: Stable cardiomediastinal silhouette. No pneumothorax is noted. No
pleural effusion is noted. Stable bilateral perihilar and basilar
opacities are noted concerning for pneumonia or edema. Bony thorax
is unremarkable. Right internal jugular catheter is unchanged in
position.
IMPRESSION: Stable bilateral perihilar and basilar opacities are noted
concerning for pneumonia or edema.

## 2020-03-14 MED ORDER — DELFLEX-LC/2.5% DEXTROSE 394 MOSM/L IP SOLN
INTRAPERITONEAL | Status: DC
  Administered 2020-03-16 – 2020-03-17 (×2): 5000 mL via INTRAPERITONEAL

## 2020-03-14 MED ORDER — HEPARIN 1000 UNIT/ML FOR PERITONEAL DIALYSIS: 500.0000 [IU] | INTRAMUSCULAR | Status: DC | PRN

## 2020-03-14 MED ORDER — GENTAMICIN SULFATE 0.1 % EX CREA
1.0000 "application " | TOPICAL_CREAM | Freq: Every day | CUTANEOUS | Status: DC
  Administered 2020-03-14 – 2020-03-19 (×3): 1 via TOPICAL
  Filled 2020-03-14 (×2): qty 15

## 2020-03-14 MED ORDER — LEVETIRACETAM 500 MG PO TABS
500.0000 mg | ORAL_TABLET | Freq: Every day | ORAL | Status: DC
  Administered 2020-03-15 – 2020-03-25 (×10): 500 mg via ORAL
  Filled 2020-03-14 (×10): qty 1

## 2020-03-14 MED ORDER — HEPARIN 1000 UNIT/ML FOR PERITONEAL DIALYSIS
INTRAPERITONEAL | Status: DC | PRN
  Filled 2020-03-14 (×9): qty 5000

## 2020-03-14 MED ORDER — WARFARIN - PHARMACIST DOSING INPATIENT: Freq: Every day | Status: DC

## 2020-03-14 MED ORDER — HEPARIN 1000 UNIT/ML FOR PERITONEAL DIALYSIS
INTRAPERITONEAL | Status: DC | PRN
  Filled 2020-03-14 (×2): qty 3000

## 2020-03-14 MED ORDER — PANTOPRAZOLE SODIUM 40 MG PO TBEC
40.0000 mg | DELAYED_RELEASE_TABLET | Freq: Every day | ORAL | Status: DC
  Administered 2020-03-14 – 2020-03-26 (×11): 40 mg via ORAL
  Filled 2020-03-14 (×11): qty 1

## 2020-03-14 MED ORDER — WARFARIN SODIUM 2.5 MG PO TABS
2.5000 mg | ORAL_TABLET | Freq: Once | ORAL | Status: AC
  Administered 2020-03-14: 2.5 mg via ORAL
  Filled 2020-03-14: qty 1

## 2020-03-14 MED ORDER — DELFLEX-LC/4.25% DEXTROSE 483 MOSM/L IP SOLN
INTRAPERITONEAL | Status: DC
  Administered 2020-03-16 – 2020-03-17 (×2): 5000 mL via INTRAPERITONEAL

## 2020-03-14 MED ORDER — HEPARIN BOLUS VIA INFUSION
2000.0000 [IU] | Freq: Once | INTRAVENOUS | Status: AC
  Administered 2020-03-14: 2000 [IU] via INTRAVENOUS
  Filled 2020-03-14: qty 2000

## 2020-03-14 MED ORDER — HEPARIN 1000 UNIT/ML FOR PERITONEAL DIALYSIS
INTRAPERITONEAL | Status: DC | PRN
  Filled 2020-03-14 (×3): qty 3000
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
## 2020-03-14 NOTE — Progress Notes (Signed)
ANTICOAGULATION CONSULT NOTE - Follow Up Consult  Pharmacy Consult for heparin Indication: h/o VTEs  Labs: Recent Labs    03/12/20 0038 03/12/20 1159 03/12/20 1600 03/12/20 2136 03/13/20 0351 03/13/20 1808 03/14/20 0233  HGB 8.6*  --   --   --  9.1*  --  8.0*  HCT 28.0*  --   --   --  28.9*  --  27.0*  PLT 169  --   --   --  187  --  196  APTT  --   --   --   --  38*  --   --   LABPROT 14.5  --   --   --  13.9  --  13.6  INR 1.2  --   --   --  1.1  --  1.1  HEPARINUNFRC 0.19*   < >  --  <0.10*  --  0.21* <0.10*  CREATININE 2.27*  --  2.20*  --  1.83*  --  3.84*   < > = values in this interval not displayed.    Assessment: 45yo male subtherapeutic on heparin with much lower level despite increased rate last pm; no gtt issues or signs of bleeding per RN.  Of note pt had been on heparin 1350 units/hr via CRRT machine in addition to systemic heparin; CRRT now d/c'd and no longer getting that additional anticoag.  Goal of Therapy:  Heparin level 0.3-0.7 units/ml   Plan:  Will give small heparin bolus of 2000 units and increase heparin gtt by 4 units/kg/hr to 3200 units/hr and check level in 6 hours.    Wynona Neat, PharmD, BCPS  03/14/2020,5:05 AM

## 2020-03-14 NOTE — Progress Notes (Addendum)
[place] for IV heparin>>warfarin Indication: Hx PE, Chronic DVT  Allergies  Allergen Reactions  . Doxycycline Hives  . Latex Swelling    Pt reports swelling at site.   . Morphine And Related Anxiety    Patient Measurements: Height: '5\' 9"'$  (175.3 cm) Weight: 122 kg (268 lb 15.4 oz) IBW/kg (Calculated) : 70.7 Heparin Dosing Weight: 95 kg  Vital Signs: Temp: 98.2 F (36.8 C) (02/17 0325) Temp Source: Oral (02/17 0325) BP: 151/67 (02/17 0500) Pulse Rate: 95 (02/17 0741)  Labs: Recent Labs    03/12/20 0038 03/12/20 1159 03/12/20 1600 03/12/20 2136 03/13/20 0351 03/13/20 1808 03/14/20 0233  HGB 8.6*  --   --   --  9.1*  --  8.0*  HCT 28.0*  --   --   --  28.9*  --  27.0*  PLT 169  --   --   --  187  --  196  APTT  --   --   --   --  38*  --   --   LABPROT 14.5  --   --   --  13.9  --  13.6  INR 1.2  --   --   --  1.1  --  1.1  HEPARINUNFRC 0.19*   < >  --  <0.10*  --  0.21* <0.10*  CREATININE 2.27*  --  2.20*  --  1.83*  --  3.84*   < > = values in this interval not displayed.    Estimated Creatinine Clearance: 32 mL/min (A) (by C-G formula based on SCr of 3.84 mg/dL (H)).   Assessment: 89 yoM on warfarin PTA for hx PE and chronic DVTs admitted s/p cardiac arrest. Warfarin held, heparin started 2/12 with INR <2.  Heparin restarted yesterday. Rate adjusted this morning, patient with much higher requirements off crrt. Hgb low but stable at 8, plt wnl. No overt bleeding noted. INR normal at 1, orders to resume warfarin. Renal transitioning to CCPD.   Heparin level 0.66 on 3200 units/hr  Goal of Therapy:  Heparin level 0.3-0.7 units/ml Monitor platelets by anticoagulation protocol: Yes   Plan:  Continue Heparin at 3200 units/hr Checking 8 hour heparin level Warfarin 2.'5mg'$  this afternoon Daily heparin level, INR, and CBC   Erin Hearing PharmD., BCPS Clinical Pharmacist 03/14/2020 9:13 AM
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
## 2020-03-14 NOTE — Progress Notes (Signed)
Inpatient Rehab Admissions:  Inpatient Rehab Consult received.  I met with patient at the bedside for rehabilitation assessment and to discuss goals and expectations of an inpatient rehab admission.  We discussed PLOF of mod I with rollator in the home, and w/c level in the community.  Pt does endorse 24/7 support from significant other and their 2 adult children (ages 37, 34) and that they have provided assist for him at hospital discharge in the past.  We discussed that there are currently no beds available on CIR, and that based on PLOF and PT recommendations for SNF, he may not be able to tolerate intensity of CIR.  Note Farris Has, CSW has already met with patient and he expressed same desire to return home.  CIR will sign off at this time.    Signed: Shann Medal, PT, DPT Admissions Coordinator [phone] 03/14/20  12:07 PM

## 2020-03-14 NOTE — Progress Notes (Signed)
Occupational Therapy Treatment Patient Details Name: Mills Mckaig. MRN: [id number] DOB: 12/04/1975 Today's Date: 03/14/2020    History of present illness [place] Huttner. is a 45 y.o. male who during early AM hours 2/10,  had worsening dyspnea at home.  Had apparently been experiencing dyspnea and trying to manage it with home peritoneal dialysis.  EMS was called and he initially refused transport to ED; however, after their arrival, he had a cardiac arrest.  Initial rhythm was asystole and he required 12 minutes before ROSC. Pt intubated 2/10, self extubated at 2/14 at midnight.  Also on CRRT.  PMH: including but not limited to ESRD on PD, DM, NSTEMI, HTN, DVT on warfarin, OSA.   OT comments  Patient with nice participation and progress toward stated goals.  He appears to be moving better, Min A for bed mobility and stand pivot transfer to recliner with gait belt and RW.  Patient was able to bathe himself from sit/stand position, needing Min Guard for peri care while standing at RW level, and OT assisted with his back only.  Patient needed appropriate assist with donning gown due to line/leads, but was able to place his socks seated.  OT to continue to follow in the acute setting.  SNF has been recommended, but he would prefer to transition home with [facility].    Follow Up Recommendations  SNF    Equipment Recommendations  3 in 1 bedside commode;Wheelchair (measurements OT);Wheelchair cushion (measurements OT)    Recommendations for Other Services      Precautions / Restrictions Precautions Precautions: Fall Precaution Comments: CRRT neck access Restrictions Weight Bearing Restrictions: No       Mobility Bed Mobility   Bed Mobility: Supine to Sit     Supine to sit: Min assist;HOB elevated        Transfers Overall transfer level: Needs assistance Equipment used: Rolling walker (2 wheeled) Transfers: Sit to/from [facility] Sit to Stand: Min assist Stand  pivot transfers: Min assist            Balance   Sitting-balance support: No upper extremity supported;Feet supported Sitting balance-Leahy Scale: Fair     Standing balance support: Bilateral upper extremity supported;During functional activity Standing balance-Leahy Scale: Poor Standing balance comment: reliant on RW                           ADL either performed or assessed with clinical judgement   ADL       Grooming: Set up;Sitting   Upper Body Bathing: Sitting;Supervision/ safety   Lower Body Bathing: Min guard;Sit to/from stand   Upper Body Dressing : Minimal assistance;Sitting   Lower Body Dressing: Min guard;Sit to/from stand               Functional mobility during ADLs: Minimal assistance;Rolling walker       Vision Patient Visual Report: No change from baseline     Perception     Praxis      Cognition Arousal/Alertness: Awake/alert Behavior During Therapy: Flat affect                         Memory: Decreased short-term memory Following Commands: Follows one step commands consistently     Problem Solving: Requires verbal cues;Slow processing                      General Comments  HR to  115 with ADL    Pertinent Vitals/ Pain       Pain Assessment: No/denies pain Pain Intervention(s): Monitored during session                                                          Frequency  Min 2X/week        Progress Toward Goals  OT Goals(current goals can now be found in the care plan section)  Progress towards OT goals: Progressing toward goals  Acute Rehab OT Goals Patient Stated Goal: I'd like to go home and have [place] come in. OT Goal Formulation: With patient Time For Goal Achievement: 03/26/20 Potential to Achieve Goals: Good  Plan Discharge plan remains appropriate    Co-evaluation                 AM-PAC OT "6 Clicks" Daily Activity     Outcome Measure   Help  from another person eating meals?: A Little Help from another person taking care of personal grooming?: A Little Help from another person toileting, which includes using toliet, bedpan, or urinal?: A Lot Help from another person bathing (including washing, rinsing, drying)?: A Little Help from another person to put on and taking off regular upper body clothing?: A Little Help from another person to put on and taking off regular lower body clothing?: A Little 6 Click Score: 17    End of Session Equipment Utilized During Treatment: Gait belt;Rolling walker;Oxygen  OT Visit Diagnosis: Unsteadiness on feet (R26.81);Other abnormalities of gait and mobility (R26.89);Muscle weakness (generalized) (M62.81);Pain   Activity Tolerance Patient tolerated treatment well   Patient Left in chair   Nurse Communication Other (comment) (patient able to bathe and dress himself)        Time: [id number] OT Time Calculation (min): 31 min  Charges: OT General Charges $OT Visit: 1 Visit OT Treatments $Self Care/Home Management : 23-37 mins  03/14/2020  Rich, OTR/L  Acute Rehabilitation Services  Office:  [phone]    Metta Clines 03/14/2020, 10:16 AM

## 2020-03-14 NOTE — Progress Notes (Signed)
Pt received to room, telemetry applied and CCMD notified.  CHG bath performed, vitals taken, and Pt oriented to room, equipment, and care plan for the day.  Will cont plan of care.

## 2020-03-14 NOTE — Progress Notes (Signed)
  Speech Language Pathology Treatment: Dysphagia  Patient Details Name: Max Pittman. MRN: [id number] DOB: Dec 04, 1975 Today's Date: 03/14/2020 Time: [id number] SLP Time Calculation (min) (ACUTE ONLY): 8 min  Assessment / Plan / Recommendation Clinical Impression  Pt tolerating regular textures. Not requiring a chin tuck to prevent coughing at this time, but reinforced this for safety if he is able to recall. No further SLP needs at this time will sign off.   HPI        SLP Plan  All goals met       Recommendations  Diet recommendations: Regular;Thin liquid Liquids provided via: Cup;Straw Medication Administration: Whole meds with liquid                Follow up Recommendations: None Plan: All goals met       GO               Max Baltimore, MA CCC-SLP  Acute Rehabilitation Services Pager [phone] Office [phone]  Lynann Beaver 03/14/2020, 2:01 PM

## 2020-03-14 NOTE — Progress Notes (Signed)
Patient ID: Max Pittman., male   DOB: 04-08-1975, 45 y.o.   MRN: [id number] [facility] Progress Note   Assessment/ Plan:   1.  Acute hypoxic respiratory failure: Secondary to asystolic cardiac arrest likely provoked by volume overload/pulmonary edema.  Maintains decent oxygenation on supplementation via nasal cannula following self extubation a few days ago. 2. ESRD: With significant anasarca/volume overload on admission raising concerns for modality failure versus nonadherence with peritoneal dialysis.  Earlier on CRRT with net negative fluid balance of 31 L since admission-this modality was stopped yesterday morning with rising filter pressures and he will be transitioned back to [facility] tonight. 3. Anemia: Without overt blood loss, low hemoglobin/hematocrit noted, continue ESA. 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 5. Nutrition: On renal diet without any evidence of aspiration. 6. Hypertension: Blood pressures intermittently elevated, monitor with peritoneal dialysis/ultrafiltration.  Subjective:   Without acute events overnight, reporting to be breathing well and denies any chest pain.  Awaiting transfer out of ICU.   Objective:   BP (!) 151/67   Pulse 95   Temp 98.2 F (36.8 C) (Oral)   Resp 16   Ht '5\' 9"'$  (1.753 m)   Wt 122 kg   SpO2 99%   BMI 39.72 kg/m   Physical Exam: Gen: Comfortably resting in bed, on oxygen via nasal cannula CVS: Pulse regular rhythm, normal rate, S1 and S2 normal Resp: Decreased breath sounds over bases, no distinct rhonchi/rales Abd: Soft, obese, nontender, PD catheter in situ Ext: Trace-1+ pitting edema over lower extremities, 1+ edema over lower back   Intake/Output Summary (Last 24 hours) at 03/14/2020 0749 Last data filed at 03/13/2020 2000 Gross per 24 hour  Intake 310.91 ml  Output 249 ml  Net 61.91 ml     Labs: BMET Recent Labs  Lab 03/10/20 1543  03/10/20 2034 03/11/20 0347 03/11/20 0352 03/11/20 1643 03/12/20 0038 03/12/20 1600 03/13/20 0351 03/14/20 0233  NA 142   < > 134* 138 135 136 134* 135 135  K 4.5   < > 4.0 4.1 4.5 4.4 4.1 4.3 4.0  CL 101  --  100  --  101 101 100 100 101  CO2 24  --  26  --  '25 24 25 25 24  '$ GLUCOSE 158*  --  180*  --  111* 142* 142* 138* 121*  BUN 14  --  13  --  '13 13 11 9 '$ 25*  CREATININE 2.39*  --  2.18*  --  2.18* 2.27* 2.20* 1.83* 3.84*  CALCIUM 8.4*  --  8.7*  --  8.9 9.2 9.1 9.3 9.4  PHOS 2.1*  --  2.3*  --  1.9* 1.6* 2.6 2.2* 4.6   < > = values in this interval not displayed.   CBC Recent Labs  Lab 03/11/20 0347 03/11/20 0352 03/12/20 0038 03/13/20 0351 03/14/20 0233  WBC 8.1  --  9.2 8.7 12.2*  NEUTROABS  --   --   --  4.2 5.6  HGB 8.5* 9.2* 8.6* 9.1* 8.0*  HCT 28.2* 27.0* 28.0* 28.9* 27.0*  MCV 91.9  --  90.3 90.0 92.5  PLT 155  --  169 187 196     Medications:    . (feeding supplement) PROSource Plus  30 mL Oral BID WC  . albuterol  2.5 mg Nebulization BID  . amLODipine  5 mg Oral Daily  . aspirin  81 mg Oral Daily  .  atorvastatin  40 mg Oral q1800  . buPROPion  150 mg Oral BID WC  . cephALEXin  250 mg Oral Q12H  . Chlorhexidine Gluconate Cloth  6 each Topical Daily  . docusate sodium  100 mg Oral BID  . feeding supplement  237 mL Oral BID BM  . insulin aspart  0-20 Units Subcutaneous Q4H  . lamoTRIgine  100 mg Oral BID  . mouth rinse  15 mL Mouth Rinse BID  . melatonin  5 mg Oral QHS  . metoprolol tartrate  12.5 mg Oral BID  . multivitamin  1 tablet Oral QHS  . pantoprazole (PROTONIX) IV  40 mg Intravenous QHS  . polyethylene glycol  17 g Oral Daily  . sodium chloride flush  10-40 mL Intracatheter Q12H   Elmarie Shiley, MD 03/14/2020, 7:49 AM

## 2020-03-15 ENCOUNTER — Telehealth: Payer: Self-pay

## 2020-03-15 DIAGNOSIS — D631 Anemia in chronic kidney disease: Secondary | ICD-10-CM | POA: Diagnosis not present

## 2020-03-15 DIAGNOSIS — R9389 Abnormal findings on diagnostic imaging of other specified body structures: Secondary | ICD-10-CM | POA: Diagnosis not present

## 2020-03-15 DIAGNOSIS — J9601 Acute respiratory failure with hypoxia: Secondary | ICD-10-CM | POA: Diagnosis not present

## 2020-03-15 DIAGNOSIS — N189 Chronic kidney disease, unspecified: Secondary | ICD-10-CM | POA: Diagnosis not present

## 2020-03-15 LAB — COMPREHENSIVE METABOLIC PANEL
ALT: 30 U/L (ref 0–44)
AST: 28 U/L (ref 15–41)
Albumin: 2.9 g/dL — ABNORMAL LOW (ref 3.5–5.0)
Alkaline Phosphatase: 110 U/L (ref 38–126)
Anion gap: 15 (ref 5–15)
BUN: 34 mg/dL — ABNORMAL HIGH (ref 6–20)
CO2: 24 mmol/L (ref 22–32)
Calcium: 10.3 mg/dL (ref 8.9–10.3)
Chloride: 98 mmol/L (ref 98–111)
Creatinine, Ser: 5.58 mg/dL — ABNORMAL HIGH (ref 0.61–1.24)
GFR, Estimated: 12 mL/min — ABNORMAL LOW (ref >60)
Glucose, Bld: 156 mg/dL — ABNORMAL HIGH (ref 70–99)
Potassium: 4 mmol/L (ref 3.5–5.1)
Sodium: 137 mmol/L (ref 135–145)
Total Bilirubin: 0.5 mg/dL (ref 0.3–1.2)
Total Protein: 8.5 g/dL — ABNORMAL HIGH (ref 6.5–8.1)

## 2020-03-15 LAB — CBC
HCT: 34.8 % — ABNORMAL LOW (ref 39.0–52.0)
Hemoglobin: 10.5 g/dL — ABNORMAL LOW (ref 13.0–17.0)
MCH: 27.6 pg (ref 26.0–34.0)
MCHC: 30.2 g/dL (ref 30.0–36.0)
MCV: 91.6 fL (ref 80.0–100.0)
Platelets: 274 10*3/uL (ref 150–400)
RBC: 3.8 MIL/uL — ABNORMAL LOW (ref 4.22–5.81)
RDW: 18.3 % — ABNORMAL HIGH (ref 11.5–15.5)
WBC: 15.1 10*3/uL — ABNORMAL HIGH (ref 4.0–10.5)
nRBC: 1 % — ABNORMAL HIGH (ref 0.0–0.2)

## 2020-03-15 LAB — PROTIME-INR
INR: 1.1 (ref 0.8–1.2)
Prothrombin Time: 13.9 seconds (ref 11.4–15.2)

## 2020-03-15 LAB — GLUCOSE, CAPILLARY
Glucose-Capillary: 129 mg/dL — ABNORMAL HIGH (ref 70–99)
Glucose-Capillary: 136 mg/dL — ABNORMAL HIGH (ref 70–99)
Glucose-Capillary: 147 mg/dL — ABNORMAL HIGH (ref 70–99)
Glucose-Capillary: 169 mg/dL — ABNORMAL HIGH (ref 70–99)
Glucose-Capillary: 180 mg/dL — ABNORMAL HIGH (ref 70–99)
Glucose-Capillary: 204 mg/dL — ABNORMAL HIGH (ref 70–99)
Glucose-Capillary: 208 mg/dL — ABNORMAL HIGH (ref 70–99)

## 2020-03-15 LAB — HEPARIN LEVEL (UNFRACTIONATED)
Heparin Unfractionated: 1.04 IU/mL — ABNORMAL HIGH (ref 0.30–0.70)
Heparin Unfractionated: 1.2 IU/mL — ABNORMAL HIGH (ref 0.30–0.70)

## 2020-03-15 LAB — MAGNESIUM: Magnesium: 2.7 mg/dL — ABNORMAL HIGH (ref 1.7–2.4)

## 2020-03-15 LAB — PHOSPHORUS: Phosphorus: 4.3 mg/dL (ref 2.5–4.6)

## 2020-03-15 MED ORDER — HEPARIN (PORCINE) 25000 UT/250ML-% IV SOLN
2600.0000 [IU]/h | INTRAVENOUS | Status: DC
  Administered 2020-03-15 – 2020-03-16 (×2): 2400 [IU]/h via INTRAVENOUS
  Administered 2020-03-16: 2700 [IU]/h via INTRAVENOUS
  Administered 2020-03-17: 2100 [IU]/h via INTRAVENOUS
  Administered 2020-03-17: 2400 [IU]/h via INTRAVENOUS
  Administered 2020-03-18: 2300 [IU]/h via INTRAVENOUS
  Administered 2020-03-18 – 2020-03-19 (×3): 2500 [IU]/h via INTRAVENOUS
  Administered 2020-03-20: 2600 [IU]/h via INTRAVENOUS
  Filled 2020-03-15 (×10): qty 250

## 2020-03-15 MED ORDER — HEPARIN (PORCINE) 25000 UT/250ML-% IV SOLN
2400.0000 [IU]/h | INTRAVENOUS | Status: DC
  Filled 2020-03-15: qty 250

## 2020-03-15 MED ORDER — HEPARIN 1000 UNIT/ML FOR PERITONEAL DIALYSIS
INTRAPERITONEAL | Status: DC | PRN
  Filled 2020-03-15 (×3): qty 5000

## 2020-03-15 MED ORDER — WARFARIN SODIUM 2.5 MG PO TABS
2.5000 mg | ORAL_TABLET | Freq: Once | ORAL | Status: AC
  Administered 2020-03-15: 2.5 mg via ORAL
  Filled 2020-03-15: qty 1

## 2020-03-15 MED ORDER — MENTHOL 3 MG MT LOZG
1.0000 | LOZENGE | OROMUCOSAL | Status: DC | PRN
  Filled 2020-03-15: qty 9

## 2020-03-15 NOTE — Progress Notes (Signed)
Peritoneal dialysis machine keep on alarming with the messages on the screen" supply bag lines are blocked,pls checked the supply lines.Talk to the support system of the machine "Candice " and explain what happened with instruction to checked and review and go over the settings in the screen of the machine  thru phone instruction.The technician then instruct this RN to call the hemodialysis RN.Ashley Akin RN,with instruction to go over with the settings in the machine and instructed this RN to stop the treatment by turning the machine off.Will continue to monitor the patient.

## 2020-03-15 NOTE — Progress Notes (Signed)
[facility] for IV heparin>>warfarin Indication: Hx PE, Chronic DVT  Allergies  Allergen Reactions  . Doxycycline Hives  . Latex Swelling    Pt reports swelling at site.   . Morphine And Related Anxiety    Patient Measurements: Height: '5\' 9"'$  (175.3 cm) Weight: 125.1 kg (275 lb 12.8 oz) IBW/kg (Calculated) : 70.7 Heparin Dosing Weight: 95 kg  Vital Signs: Temp: 99.1 F (37.3 C) (02/18 1143) Temp Source: Oral (02/18 1143) BP: 136/87 (02/18 1143) Pulse Rate: 94 (02/18 1143)  Labs: Recent Labs    03/13/20 0351 03/13/20 1808 03/14/20 0233 03/14/20 1213 03/14/20 2112 03/14/20 2348 03/15/20 0605  HGB 9.1*  --  8.0*  --   --   --  10.5*  HCT 28.9*  --  27.0*  --   --   --  34.8*  PLT 187  --  196  --   --   --  274  APTT 38*  --   --   --   --   --   --   LABPROT 13.9  --  13.6  --   --   --  13.9  INR 1.1  --  1.1  --   --   --  1.1  HEPARINUNFRC  --    < > <0.10*   < > >2.20* 1.04* 1.20*  CREATININE 1.83*  --  3.84*  --   --   --  5.58*   < > = values in this interval not displayed.    Estimated Creatinine Clearance: 22.3 mL/min (A) (by C-G formula based on SCr of 5.58 mg/dL (H)).   Assessment: 70 yoM on warfarin PTA for hx PE and chronic DVTs admitted s/p cardiac arrest. Warfarin held, heparin started 2/12 with INR <2.  Heparin restarted. Heparin drip 2800 uts/hr running through peripheral IV and heparin level 1.2 drawn from hand just below infusion as the opposite arm is restricted.   Hgb stable 8-10, plt wnl. No overt bleeding noted. INR 1.1 warfarin restarted cautiously. Renal transitioning to [facility]CPD.   Heparin level 0.66 on 3200 units/hr  Goal of Therapy:  Heparin level 0.3-0.7 units/ml Monitor platelets by anticoagulation protocol: Yes   Plan:  Hold  Heparin x1 hour  Then decrease heparin drip 2400 units/hr Checking 6 hour heparin level Warfarin 2.'5mg'$  this afternoon - repeat Daily heparin level, INR, and CBC     Bonnita Nasuti Pharm.D. CPP, BCPS Clinical Pharmacist [phone] 03/15/2020 1:18 PM

## 2020-03-15 NOTE — Progress Notes (Signed)
Patient ID: Max Pittman., male   DOB: [birth year], 45 y.o.   MRN: [id number] [facility] Progress Note   Assessment/ Plan:   1.  Acute hypoxic respiratory failure: Secondary to asystolic cardiac arrest likely provoked by volume overload/pulmonary edema.  On oxygen supplementation via nasal cannula following self extubation a few days ago and continues to maintain decent oxygenation. 2. ESRD: With significant anasarca/volume overload on admission raising concerns for modality failure versus nonadherence with peritoneal dialysis.  He was transiently on CRRT with net negative fluid balance of 31 L for aggressive volume unloading.  Continue with CCPD tonight with efforts at continued ultrafiltration.  His disposition will determine whether he will continue peritoneal dialysis or transiently require hemodialysis. 3. Anemia: Without overt blood loss, low hemoglobin/hematocrit noted, continue ESA. 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 5. Nutrition: On renal diet without any evidence of aspiration. 6. Hypertension: Blood pressures intermittently elevated, monitor with peritoneal dialysis/ultrafiltration.  Subjective:   Without acute events overnight, reporting to be breathing well and denies any chest pain.  He informs me that he intends to go home with [facility] PT/OT.   Objective:   BP 132/85 (BP Location: Right Arm)   Pulse 94   Temp 98.8 F (37.1 C) (Oral)   Resp (!) 23   Ht '5\' 9"'$  (1.753 m)   Wt 125.1 kg   SpO2 92%   BMI 40.73 kg/m   Physical Exam: Gen: Sitting up in bed, being assisted by CNA with self-care CVS: Pulse regular rhythm, normal rate, S1 and S2 normal Resp: Decreased breath sounds over bases, no distinct rhonchi/rales Abd: Soft, obese, nontender, PD catheter in situ Ext: Trace-1+ pitting edema over lower extremities, 1+ edema over lower back   Intake/Output Summary (Last 24 hours) at 03/15/2020  0931 Last data filed at 03/14/2020 1200 Gross per 24 hour  Intake 137.01 ml  Output --  Net 137.01 ml     Labs: BMET Recent Labs  Lab 03/11/20 0347 03/11/20 0352 03/11/20 1643 03/12/20 0038 03/12/20 1600 03/13/20 0351 03/14/20 0233 03/15/20 0605  NA 134* 138 135 136 134* 135 135 137  K 4.0 4.1 4.5 4.4 4.1 4.3 4.0 4.0  CL 100  --  101 101 100 100 101 98  CO2 26  --  '25 24 25 25 24 24  '$ GLUCOSE 180*  --  111* 142* 142* 138* 121* 156*  BUN 13  --  '13 13 11 9 '$ 25* 34*  CREATININE 2.18*  --  2.18* 2.27* 2.20* 1.83* 3.84* 5.58*  CALCIUM 8.7*  --  8.9 9.2 9.1 9.3 9.4 10.3  PHOS 2.3*  --  1.9* 1.6* 2.6 2.2* 4.6 4.3   CBC Recent Labs  Lab 03/12/20 0038 03/13/20 0351 03/14/20 0233 03/15/20 0605  WBC 9.2 8.7 12.2* 15.1*  NEUTROABS  --  4.2 5.6  --   HGB 8.6* 9.1* 8.0* 10.5*  HCT 28.0* 28.9* 27.0* 34.8*  MCV 90.3 90.0 92.5 91.6  PLT 169 187 196 274     Medications:    . (feeding supplement) PROSource Plus  30 mL Oral BID WC  . albuterol  2.5 mg Nebulization BID  . amLODipine  5 mg Oral Daily  . aspirin  81 mg Oral Daily  . atorvastatin  40 mg Oral q1800  . buPROPion  150 mg Oral BID WC  . cephALEXin  250 mg Oral Q12H  . Chlorhexidine Gluconate Cloth  6 each Topical  Daily  . docusate sodium  100 mg Oral BID  . feeding supplement  237 mL Oral BID BM  . gentamicin cream  1 application Topical Daily  . insulin aspart  0-20 Units Subcutaneous Q4H  . lamoTRIgine  100 mg Oral BID  . levETIRAcetam  500 mg Oral Daily  . mouth rinse  15 mL Mouth Rinse BID  . melatonin  5 mg Oral QHS  . metoprolol tartrate  12.5 mg Oral BID  . multivitamin  1 tablet Oral QHS  . pantoprazole  40 mg Oral Daily  . polyethylene glycol  17 g Oral Daily  . sodium chloride flush  10-40 mL Intracatheter Q12H  . Warfarin - Pharmacist Dosing Inpatient   Does not apply q1600   Elmarie Shiley, MD 03/15/2020, 9:31 AM

## 2020-03-15 NOTE — Progress Notes (Signed)
PT Cancellation Note  Patient Details Name: Max Pittman. MRN: [id number] DOB: Feb 07, 1975   Cancelled Treatment:    Reason Eval/Treat Not Completed: (P) Patient declined, no reason specified Attempted 10:04, pt eating breakfast not ready. Attempted again 12:44, pt states "it's not a good time now." Will continue efforts per PT POC as schedule permits.  Mariann Palo M Aj [place] 03/15/2020, 1:09 PM

## 2020-03-15 NOTE — Care Management Important Message (Signed)
Important Message  Patient Details  Name: Max Pittman. MRN: [id number] Date of Birth: July 15, 1975   Medicare Important Message Given:  Yes     Shelda Altes 03/15/2020, 9:04 AM

## 2020-03-15 NOTE — Progress Notes (Addendum)
Physical Therapy Treatment Patient Details Name: Max Pittman. MRN: [id number] DOB: 1975-02-07 Today's Date: 03/15/2020    History of Present Illness Max Pittman. is a 45 y.o. male who during early AM hours 2/10,  had worsening dyspnea at home.  Had apparently been experiencing dyspnea and trying to manage it with home peritoneal dialysis.  EMS was called and he initially refused transport to ED; however, after their arrival, he had a cardiac arrest.  Initial rhythm was asystole and he required 12 minutes before ROSC. Pt intubated 2/10, self extubated at 2/14 at midnight.  Also on CRRT.  PMH: including but not limited to ESRD on PD, DM, NSTEMI, HTN, DVT on warfarin, OSA.    PT Comments    Pt received in supine, agreeable to therapy session with encouragement, with good participation and tolerance for mobility. Pt reporting some fatigue after sitting EOB earlier but able to perform seated balance tasks and transfer training and short gait task at bedside with min guard to minA. Pt requiring increased time and verbal cues to perform all tasks safely and needs instruction on benefits of therapy participation for improved compliance. Pt HR 90's bpm seated EOB and up to 112 bpm with gait task. Pt continues to benefit from PT services to progress toward functional mobility goals. Continue to recommend SNF, although pt reports he plans to go home at discharge.   Follow Up Recommendations  SNF (if pt refuses SNF, would benefit from HHPT.)     Equipment Recommendations  Rolling walker with 5" wheels;3in1 (PT) (pt may already own, need to clarify)   Recommendations for Other Services       Precautions / Restrictions Precautions Precautions: Fall Precaution Comments: CRRT neck access, PD catheter in LLQ Restrictions Weight Bearing Restrictions: No    Mobility  Bed Mobility Overal bed mobility: Needs Assistance Bed Mobility: Supine to Sit;Sit to Supine     Supine to sit: HOB  elevated;Min guard Sit to supine: Min guard   General bed mobility comments: heavy reliance on bed features to perform    Transfers Overall transfer level: Needs assistance Equipment used: Rolling walker (2 wheeled) Transfers: Sit to/from Stand Sit to Stand: Min guard;From elevated surface         General transfer comment: bed height elevated ~2", cues for hand placement/sequencing with poor carryover, no LOB  Ambulation/Gait Ambulation/Gait assistance: Min assist;Min guard Gait Distance (Feet): 6 Feet Assistive device: Rolling walker (2 wheeled) Gait Pattern/deviations: Step-to pattern;Decreased step length - left;Shuffle;Trunk flexed;Antalgic;Decreased weight shift to right Gait velocity: slowed   General Gait Details: pt reporting increased R groin pain today causing antalgic pattern, heavy reliance on RW due to this but good use of AD, no LOB, minA at most for stability, mostly min guard   Stairs             Wheelchair Mobility    Modified Rankin (Stroke Patients Only)       Balance Overall balance assessment: Needs assistance Sitting-balance support: No upper extremity supported;Feet supported Sitting balance-Leahy Scale: Fair     Standing balance support: Bilateral upper extremity supported;During functional activity Standing balance-Leahy Scale: Poor Standing balance comment: heavy reliance on RW                            Cognition Arousal/Alertness: Awake/alert Behavior During Therapy: Flat affect Overall Cognitive Status: Impaired/Different from baseline Area of Impairment: Safety/judgement;Memory;Following commands;Awareness;Problem solving  Memory: Decreased short-term memory Following Commands: Follows one step commands with increased time;Follows multi-step commands inconsistently Safety/Judgement: Decreased awareness of safety;Decreased awareness of deficits Awareness: Intellectual Problem Solving:  Slow processing;Requires verbal cues;Difficulty sequencing General Comments: increased time with answering questions, pt anxious about vitals throughout session and increased time to initiate/perform tasks      Exercises General Exercises - Lower Extremity Ankle Circles/Pumps: AROM;Both;10 reps;Supine Long Arc Quad: AROM;Both;10 reps;Seated Hip Flexion/Marching: AROM;Right;Left;10 reps;Seated (limited ROM due to reported groin pain on R/previous L hip injury) Heel Raises: AROM;Both;10 reps;Standing Other Exercises Other Exercises: seated balance practice reaching outside BoS and weight shifting to L/R elbow    General Comments        Pertinent Vitals/Pain Pain Assessment: Faces Faces Pain Scale: Hurts little more Pain Location: generalized LE and R groin/chest soreness from compressions Pain Descriptors / Indicators: Grimacing;Guarding;Aching Pain Intervention(s): Monitored during session;Repositioned (offered ice but pt declined)    Home Living                      Prior Function            PT Goals (current goals can now be found in the care plan section) Acute Rehab PT Goals Patient Stated Goal: I'd like to go home and have [place] come in. PT Goal Formulation: With patient Time For Goal Achievement: 03/25/20 Potential to Achieve Goals: Good Progress towards PT goals: Progressing toward goals    Frequency    Min 3X/week      PT Plan Discharge plan needs to be updated    Co-evaluation              AM-PAC PT "6 Clicks" Mobility   Outcome Measure  Help needed turning from your back to your side while in a flat bed without using bedrails?: A Little Help needed moving from lying on your back to sitting on the side of a flat bed without using bedrails?: A Little Help needed moving to and from a bed to a chair (including a wheelchair)?: A Little Help needed standing up from a chair using your arms (e.g., wheelchair or bedside chair)?: A Little Help  needed to walk in hospital room?: A Little Help needed climbing 3-5 steps with a railing? : Total 6 Click Score: 16    End of Session Equipment Utilized During Treatment: Gait belt Activity Tolerance: Patient tolerated treatment well Patient left: with call bell/phone within reach;in bed;with bed alarm set Nurse Communication: Mobility status PT Visit Diagnosis: Muscle weakness (generalized) (M62.81)     Time: [id number] PT Time Calculation (min) (ACUTE ONLY): 25 min  Charges:  $Gait Training: 8-22 mins $Therapeutic Activity: 8-22 mins                     Jennfier Abdulla P., PTA Acute Rehabilitation Services Pager: [phone] Office: [place] 03/15/2020, 5:12 PM
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
## 2020-03-15 NOTE — Telephone Encounter (Signed)
Called patient to get appointment rescheduled, mailbox is full

## 2020-03-15 NOTE — Telephone Encounter (Signed)
Patient mail box is full, will try again later. Need to reschedule appointment with [place].

## 2020-03-15 NOTE — Progress Notes (Signed)
ANTICOAGULATION CONSULT NOTE - Follow Up Consult  Pharmacy Consult for heparin Indication: h/o VTEs  Labs: Recent Labs    03/12/20 1600 03/12/20 2136 03/13/20 0351 03/13/20 1808 03/14/20 0233 03/14/20 1213 03/14/20 2112 03/14/20 2348  HGB  --   --  9.1*  --  8.0*  --   --   --   HCT  --   --  28.9*  --  27.0*  --   --   --   PLT  --   --  187  --  196  --   --   --   APTT  --   --  38*  --   --   --   --   --   LABPROT  --   --  13.9  --  13.6  --   --   --   INR  --   --  1.1  --  1.1  --   --   --   HEPARINUNFRC  --    < >  --    < > <0.10* 0.66 >2.20* 1.04*  CREATININE 2.20*  --  1.83*  --  3.84*  --   --   --    < > = values in this interval not displayed.    Assessment: 45yo male now supratherapeutic on heparin after one level at upper end of goal; no gtt issues or signs of bleeding per RN.  Of note pt has restricted access and needs to have labs drawn from a few inches below heparin infusion, which could affect level.  Goal of Therapy:  Heparin level 0.3-0.7 units/ml   Plan:  Will decrease heparin gtt by 3 units/kg/hr to 2800 units/hr and check level in 8 hours.    Wynona Neat, PharmD, BCPS  03/15/2020,12:45 AM

## 2020-03-16 DIAGNOSIS — Z7901 Long term (current) use of anticoagulants: Secondary | ICD-10-CM | POA: Diagnosis not present

## 2020-03-16 DIAGNOSIS — J9601 Acute respiratory failure with hypoxia: Secondary | ICD-10-CM | POA: Diagnosis not present

## 2020-03-16 DIAGNOSIS — N186 End stage renal disease: Secondary | ICD-10-CM | POA: Diagnosis not present

## 2020-03-16 DIAGNOSIS — Z992 Dependence on renal dialysis: Secondary | ICD-10-CM

## 2020-03-16 DIAGNOSIS — D649 Anemia, unspecified: Secondary | ICD-10-CM

## 2020-03-16 DIAGNOSIS — N189 Chronic kidney disease, unspecified: Secondary | ICD-10-CM | POA: Diagnosis not present

## 2020-03-16 LAB — GLUCOSE, CAPILLARY
Glucose-Capillary: 282 mg/dL — ABNORMAL HIGH (ref 70–99)
Glucose-Capillary: 152 mg/dL — ABNORMAL HIGH (ref 70–99)
Glucose-Capillary: 108 mg/dL — ABNORMAL HIGH (ref 70–99)
Glucose-Capillary: 207 mg/dL — ABNORMAL HIGH (ref 70–99)
Glucose-Capillary: 214 mg/dL — ABNORMAL HIGH (ref 70–99)
Glucose-Capillary: 226 mg/dL — ABNORMAL HIGH (ref 70–99)

## 2020-03-16 LAB — COMPREHENSIVE METABOLIC PANEL
ALT: 24 U/L (ref 0–44)
AST: 25 U/L (ref 15–41)
Albumin: 2.7 g/dL — ABNORMAL LOW (ref 3.5–5.0)
Alkaline Phosphatase: 91 U/L (ref 38–126)
Anion gap: 14 (ref 5–15)
BUN: 38 mg/dL — ABNORMAL HIGH (ref 6–20)
CO2: 22 mmol/L (ref 22–32)
Calcium: 9.7 mg/dL (ref 8.9–10.3)
Chloride: 101 mmol/L (ref 98–111)
Creatinine, Ser: 6.37 mg/dL — ABNORMAL HIGH (ref 0.61–1.24)
GFR, Estimated: 10 mL/min — ABNORMAL LOW (ref >60)
Glucose, Bld: 249 mg/dL — ABNORMAL HIGH (ref 70–99)
Potassium: 3.9 mmol/L (ref 3.5–5.1)
Sodium: 137 mmol/L (ref 135–145)
Total Bilirubin: 0.6 mg/dL (ref 0.3–1.2)
Total Protein: 7.5 g/dL (ref 6.5–8.1)

## 2020-03-16 LAB — CBC
HCT: 30.1 % — ABNORMAL LOW (ref 39.0–52.0)
Hemoglobin: 9.2 g/dL — ABNORMAL LOW (ref 13.0–17.0)
MCH: 28 pg (ref 26.0–34.0)
MCHC: 30.6 g/dL (ref 30.0–36.0)
MCV: 91.5 fL (ref 80.0–100.0)
Platelets: 279 10*3/uL (ref 150–400)
RBC: 3.29 MIL/uL — ABNORMAL LOW (ref 4.22–5.81)
RDW: 18.6 % — ABNORMAL HIGH (ref 11.5–15.5)
WBC: 12.7 10*3/uL — ABNORMAL HIGH (ref 4.0–10.5)
nRBC: 0.4 % — ABNORMAL HIGH (ref 0.0–0.2)

## 2020-03-16 LAB — PROTIME-INR
INR: 1.1 (ref 0.8–1.2)
Prothrombin Time: 13.8 seconds (ref 11.4–15.2)

## 2020-03-16 LAB — HEPARIN LEVEL (UNFRACTIONATED)
Heparin Unfractionated: 0.35 IU/mL (ref 0.30–0.70)
Heparin Unfractionated: <0.1 IU/mL — ABNORMAL LOW (ref 0.30–0.70)
Heparin Unfractionated: 1.12 IU/mL — ABNORMAL HIGH (ref 0.30–0.70)
Heparin Unfractionated: 0.66 IU/mL (ref 0.30–0.70)

## 2020-03-16 LAB — MAGNESIUM: Magnesium: 2.5 mg/dL — ABNORMAL HIGH (ref 1.7–2.4)

## 2020-03-16 LAB — PHOSPHORUS: Phosphorus: 4.2 mg/dL (ref 2.5–4.6)

## 2020-03-16 MED ORDER — WARFARIN SODIUM 4 MG PO TABS
4.0000 mg | ORAL_TABLET | Freq: Once | ORAL | Status: AC
  Administered 2020-03-16: 4 mg via ORAL
  Filled 2020-03-16: qty 1

## 2020-03-16 NOTE — Progress Notes (Signed)
[place] for IV heparin>>warfarin Indication: Hx PE, Chronic DVT  Allergies  Allergen Reactions  . Doxycycline Hives  . Latex Swelling    Pt reports swelling at site.   . Morphine And Related Anxiety    Patient Measurements: Height: '5\' 9"'$  (175.3 cm) Weight: 125.1 kg (275 lb 12.8 oz) IBW/kg (Calculated) : 70.7 Heparin Dosing Weight: 95 kg  Vital Signs: Temp: 98.8 F (37.1 C) (02/19 0000) Temp Source: Oral (02/19 0000) BP: 117/71 (02/19 0000) Pulse Rate: 94 (02/19 0000)  Labs: Recent Labs    03/13/20 0351 03/13/20 1808 03/14/20 0233 03/14/20 1213 03/14/20 2348 03/15/20 0605 03/16/20 0210  HGB 9.1*  --  8.0*  --   --  10.5* 9.2*  HCT 28.9*  --  27.0*  --   --  34.8* 30.1*  PLT 187  --  196  --   --  274 279  APTT 38*  --   --   --   --   --   --   LABPROT 13.9  --  13.6  --   --  13.9 13.8  INR 1.1  --  1.1  --   --  1.1 1.1  HEPARINUNFRC  --    < > <0.10*   < > 1.04* 1.20* 0.35  CREATININE 1.83*  --  3.84*  --   --  5.58* 6.37*   < > = values in this interval not displayed.    Estimated Creatinine Clearance: 19.6 mL/min (A) (by C-G formula based on SCr of 6.37 mg/dL (H)).   Assessment: 64 yoM on warfarin PTA for hx PE and chronic DVTs admitted s/p cardiac arrest. Warfarin held, heparin started 2/12 with INR <2.  Heparin level therapeutic (0.35) on gtt at 2400 units/hr. No bleeding noted.  Goal of Therapy:  Heparin level 0.3-0.7 units/ml Monitor platelets by anticoagulation protocol: Yes   Plan:  Continue heparin drip at 2400 units/hr Will f/u 6 hr confirmatory heparin level  Sherlon Handing, PharmD, BCPS Please see amion for complete clinical pharmacist phone list 03/16/2020 3:46 AM

## 2020-03-16 NOTE — Progress Notes (Signed)
[place] for IV heparin>>warfarin Indication: Hx PE, Chronic DVT  Allergies  Allergen Reactions  . Doxycycline Hives  . Latex Swelling    Pt reports swelling at site.   . Morphine And Related Anxiety    Patient Measurements: Height: '5\' 9"'$  (175.3 cm) Weight: 125 kg (275 lb 9.2 oz) IBW/kg (Calculated) : 70.7 Heparin Dosing Weight: 95 kg  Vital Signs: Temp: 98.7 F (37.1 C) (02/19 1936) Temp Source: Oral (02/19 1936) BP: 151/99 (02/19 1936) Pulse Rate: 106 (02/19 1948)  Labs: Recent Labs    03/14/20 0233 03/14/20 1213 03/15/20 0605 03/16/20 0210 03/16/20 1024 03/16/20 1816 03/16/20 2121  HGB 8.0*  --  10.5* 9.2*  --   --   --   HCT 27.0*  --  34.8* 30.1*  --   --   --   PLT 196  --  274 279  --   --   --   LABPROT 13.6  --  13.9 13.8  --   --   --   INR 1.1  --  1.1 1.1  --   --   --   HEPARINUNFRC <0.10*   < > 1.20* 0.35 <0.10* 1.12* 0.66  CREATININE 3.84*  --  5.58* 6.37*  --   --   --    < > = values in this interval not displayed.    Estimated Creatinine Clearance: 19.5 mL/min (A) (by C-G formula based on SCr of 6.37 mg/dL (H)).   Assessment: 54 yoM on warfarin PTA for hx PE and chronic DVTs admitted s/p cardiac arrest. Warfarin held, heparin started 2/12 with INR <2.  Heparin level subtherapeutic (<0.1) on gtt at 2400 units/hr. INR 1.1. CBC stable. No bleeding per RN. Will not bolus as indication is d/t history of and not acute DVT.   PM f/u - heparin level reported as 1.12, but appears it was drawn right above where heparin was infusing.  Redrawn from leg = 0.66 (within goal range).  No overt bleeding or complications noted.  Goal of Therapy:  Heparin level 0.3-0.7 units/ml Monitor platelets by anticoagulation protocol: Yes   Plan:  Continue IV heparin at current rate. Daily heparin level and CBC.  [place] Crane, Roylene Reason, BCCP Clinical Pharmacist  03/16/2020 10:09 PM   [facility] pharmacy phone numbers are  listed on [email or web address]

## 2020-03-16 NOTE — Progress Notes (Signed)
[place] for IV heparin>>warfarin Indication: Hx PE, Chronic DVT  Allergies  Allergen Reactions  . Doxycycline Hives  . Latex Swelling    Pt reports swelling at site.   . Morphine And Related Anxiety    Patient Measurements: Height: '5\' 9"'$  (175.3 cm) Weight: 125 kg (275 lb 9.2 oz) IBW/kg (Calculated) : 70.7 Heparin Dosing Weight: 95 kg  Vital Signs: Temp: 98.7 F (37.1 C) (02/19 0845) Temp Source: Oral (02/19 0838) BP: 158/94 (02/19 0845) Pulse Rate: 93 (02/19 0845)  Labs: Recent Labs    03/14/20 0233 03/14/20 1213 03/15/20 0605 03/16/20 0210 03/16/20 1024  HGB 8.0*  --  10.5* 9.2*  --   HCT 27.0*  --  34.8* 30.1*  --   PLT 196  --  274 279  --   LABPROT 13.6  --  13.9 13.8  --   INR 1.1  --  1.1 1.1  --   HEPARINUNFRC <0.10*   < > 1.20* 0.35 <0.10*  CREATININE 3.84*  --  5.58* 6.37*  --    < > = values in this interval not displayed.    Estimated Creatinine Clearance: 19.5 mL/min (A) (by C-G formula based on SCr of 6.37 mg/dL (H)).   Assessment: 27 yoM on warfarin PTA for hx PE and chronic DVTs admitted s/p cardiac arrest. Warfarin held, heparin started 2/12 with INR <2.  Heparin level subtherapeutic (<0.1) on gtt at 2400 units/hr. INR 1.1. CBC stable. No bleeding per RN. Will not bolus as indication is d/t history of and not acute DVT.   Goal of Therapy:  Heparin level 0.3-0.7 units/ml Monitor platelets by anticoagulation protocol: Yes   Plan:  Increase heparin drip to 2700 units/hr Warfarin '4mg'$  x1 Will f/u 6 hr confirmatory heparin level, daily HL, INR and CBC  Norina Buzzard, PharmD PGY1 Pharmacy Resident 03/16/2020 11:44 AM

## 2020-03-16 NOTE — Progress Notes (Signed)
Physical Therapy Treatment Patient Details Name: Max Pittman. MRN: [id number] DOB: [birth year] Today's Date: 03/16/2020    History of Present Illness Max Wyer. is a 45 y.o. male who during early AM hours 2/10,  had worsening dyspnea at home.  Had apparently been experiencing dyspnea and trying to manage it with home peritoneal dialysis.  EMS was called and he initially refused transport to ED; however, after their arrival, he had a cardiac arrest.  Initial rhythm was asystole and he required 12 minutes before ROSC. Pt intubated 2/10, self extubated at 2/14 at midnight.  Also on CRRT.  PMH: including but not limited to ESRD on PD, DM, NSTEMI, HTN, DVT on warfarin, OSA.    PT Comments    Pt long sitting in bed on arrival and agreeable to participate with therapy. He donned socks with supervision while seated EOB before progressing OOB to recliner chair. Pt performed sit<>stand 5x for LE strengthening along with seated HEP. He declined further ambulation due to fatigue. Plan to focus on gait next session. Will continue to follow acutely for mobility progression.   Follow Up Recommendations  SNF (if pt refuses SNF, would benefit from HHPT.)     Equipment Recommendations  Rolling walker with 5" wheels;3in1 (PT)    Recommendations for Other Services       Precautions / Restrictions Precautions Precautions: Fall Precaution Comments: CRRT neck access, PD catheter in LLQ    Mobility  Bed Mobility Overal bed mobility: Needs Assistance Bed Mobility: Supine to Sit     Supine to sit: HOB elevated Sit to supine: Min guard   General bed mobility comments: Pt long sitting in bed on arrival. Utalized bed rails to bring LEs around to EOB and scoot forward. Min guard.    Transfers Overall transfer level: Needs assistance Equipment used: Rolling walker (2 wheeled) Transfers: Sit to/from Stand Sit to Stand: Min guard;Min assist         General transfer comment: pt able to  rise from EOB with min guard. Cues for hand placement with RW. 5x sit<>stand performed in recliner chair for LE strengthing. Pt required min A as he fatigued.  Ambulation/Gait Ambulation/Gait assistance: Min guard Gait Distance (Feet): 4 Feet Assistive device: Rolling walker (2 wheeled) Gait Pattern/deviations: Step-to pattern;Decreased step length - left;Shuffle;Trunk flexed;Antalgic;Decreased weight shift to right Gait velocity: slowed   General Gait Details: Pt able to ambulate short distance to chair i room. Declined additional ambulation, stating he didn't want to over do it.   Stairs             Wheelchair Mobility    Modified Rankin (Stroke Patients Only)       Balance Overall balance assessment: Needs assistance Sitting-balance support: No upper extremity supported;Feet supported Sitting balance-Leahy Scale: Good Sitting balance - Comments: don socks seated EOB   Standing balance support: Bilateral upper extremity supported;During functional activity Standing balance-Leahy Scale: Poor Standing balance comment: heavy reliance on RW                            Cognition Arousal/Alertness: Awake/alert Behavior During Therapy: WFL for tasks assessed/performed Overall Cognitive Status: Within Functional Limits for tasks assessed                                        Exercises General Exercises - Lower Extremity  Ankle Circles/Pumps: AROM;Both;10 reps;Supine Long Arc Quad: AROM;Both;10 reps;Seated Heel Slides: AROM;Both;10 reps;Seated Hip ABduction/ADduction: AROM;Both;10 reps;Seated;AAROM (pt assisting LLE with UEs) Hip Flexion/Marching: AROM;Both;10 reps;Seated;AAROM (pt assisting LLE with UEs)    General Comments General comments (skin integrity, edema, etc.): VSS      Pertinent Vitals/Pain Pain Assessment: Faces Faces Pain Scale: Hurts a little bit Pain Location: R Hip Pain Descriptors / Indicators:  Grimacing;Guarding;Aching Pain Intervention(s): Monitored during session;Limited activity within patient's tolerance;Repositioned    Home Living                      Prior Function            PT Goals (current goals can now be found in the care plan section) Acute Rehab PT Goals Patient Stated Goal: I'd like to go home and have [facility] come in. PT Goal Formulation: With patient Time For Goal Achievement: 03/25/20 Potential to Achieve Goals: Good Progress towards PT goals: Progressing toward goals    Frequency    Min 3X/week      PT Plan Discharge plan needs to be updated;Current plan remains appropriate    Co-evaluation              AM-PAC PT "6 Clicks" Mobility   Outcome Measure  Help needed turning from your back to your side while in a flat bed without using bedrails?: A Little Help needed moving from lying on your back to sitting on the side of a flat bed without using bedrails?: A Little Help needed moving to and from a bed to a chair (including a wheelchair)?: A Little Help needed standing up from a chair using your arms (e.g., wheelchair or bedside chair)?: A Little Help needed to walk in hospital room?: A Little Help needed climbing 3-5 steps with a railing? : Total 6 Click Score: 16    End of Session Equipment Utilized During Treatment: Gait belt Activity Tolerance: Patient tolerated treatment well Patient left: with call bell/phone within reach;in bed;with bed alarm set Nurse Communication: Mobility status PT Visit Diagnosis: Muscle weakness (generalized) (M62.81)     Time: [id number] PT Time Calculation (min) (ACUTE ONLY): 17 min  Charges:  $Therapeutic Exercise: 8-22 mins                     Benjiman Core, [place] Pager [phone] Acute Rehab   Allena Katz 03/16/2020, 12:43 PM
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
## 2020-03-16 NOTE — Progress Notes (Signed)
Patient ID: Max Pittman., male   DOB: 1975/10/28, 45 y.o.   MRN: [id number] [facility] Progress Note   Assessment/ Plan:   1.  Acute hypoxic respiratory failure: Secondary to asystolic cardiac arrest likely provoked by volume overload/pulmonary edema.  On oxygen supplementation via nasal cannula following self extubation a few days ago and continues to maintain decent oxygenation. 2. ESRD: He had anasarca/volume overload on admission raising concerns for modality failure versus nonadherence with peritoneal dialysis.  He was transiently on CRRT with net negative fluid balance of 31 L for aggressive volume unloading.  He had successful CCPD overnight with ultrafiltration of 2.9 L and I will repeat this again today-we discussed leaving his dialysis catheter in for the next 24 hours to make sure that we have reproducible success with peritoneal dialysis and do not need further hemodialysis. 3. Anemia: Without overt blood loss, low hemoglobin/hematocrit noted, continue ESA. 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 5. Nutrition: On renal diet without any evidence of aspiration. 6. Hypertension: Blood pressures intermittently elevated, monitor with peritoneal dialysis/ultrafiltration.  Subjective:   Pleased with peritoneal dialysis overnight-ultrafiltration of 2.9 L.   Objective:   BP (!) 158/94 (BP Location: Right Arm)   Pulse 93   Temp 98.7 F (37.1 C) (Oral)   Resp 12   Ht '5\' 9"'$  (1.753 m)   Wt 125 kg   SpO2 100%   BMI 40.70 kg/m   Physical Exam: Gen: Sitting up on the edge of his bed CVS: Pulse regular rhythm, normal rate, S1 and S2 normal.  Right IJ temporary dialysis catheter Resp: With expiratory rhonchi/wheeze audible, no rales bilaterally Abd: Soft, obese, nontender, PD catheter in situ Ext: Trace-1+ pitting edema over lower extremities, 1+ edema over lower back   Intake/Output Summary (Last 24  hours) at 03/16/2020 0946 Last data filed at 03/16/2020 0438 Gross per 24 hour  Intake 178 ml  Output --  Net 178 ml     Labs: BMET Recent Labs  Lab 03/11/20 1643 03/12/20 0038 03/12/20 1600 03/13/20 0351 03/14/20 0233 03/15/20 0605 03/16/20 0210  NA 135 136 134* 135 135 137 137  K 4.5 4.4 4.1 4.3 4.0 4.0 3.9  CL 101 101 100 100 101 98 101  CO2 '25 24 25 25 24 24 22  '$ GLUCOSE 111* 142* 142* 138* 121* 156* 249*  BUN '13 13 11 9 '$ 25* 34* 38*  CREATININE 2.18* 2.27* 2.20* 1.83* 3.84* 5.58* 6.37*  CALCIUM 8.9 9.2 9.1 9.3 9.4 10.3 9.7  PHOS 1.9* 1.6* 2.6 2.2* 4.6 4.3 4.2   CBC Recent Labs  Lab 03/13/20 0351 03/14/20 0233 03/15/20 0605 03/16/20 0210  WBC 8.7 12.2* 15.1* 12.7*  NEUTROABS 4.2 5.6  --   --   HGB 9.1* 8.0* 10.5* 9.2*  HCT 28.9* 27.0* 34.8* 30.1*  MCV 90.0 92.5 91.6 91.5  PLT 187 196 274 279     Medications:    . (feeding supplement) PROSource Plus  30 mL Oral BID WC  . albuterol  2.5 mg Nebulization BID  . amLODipine  5 mg Oral Daily  . aspirin  81 mg Oral Daily  . atorvastatin  40 mg Oral q1800  . buPROPion  150 mg Oral BID WC  . cephALEXin  250 mg Oral Q12H  . Chlorhexidine Gluconate Cloth  6 each Topical Daily  . docusate sodium  100 mg Oral BID  . feeding supplement  237 mL Oral BID BM  .  gentamicin cream  1 application Topical Daily  . insulin aspart  0-20 Units Subcutaneous Q4H  . lamoTRIgine  100 mg Oral BID  . levETIRAcetam  500 mg Oral Daily  . mouth rinse  15 mL Mouth Rinse BID  . melatonin  5 mg Oral QHS  . metoprolol tartrate  12.5 mg Oral BID  . multivitamin  1 tablet Oral QHS  . pantoprazole  40 mg Oral Daily  . polyethylene glycol  17 g Oral Daily  . sodium chloride flush  10-40 mL Intracatheter Q12H  . Warfarin - Pharmacist Dosing Inpatient   Does not apply q1600   Elmarie Shiley, MD 03/16/2020, 9:46 AM

## 2020-03-17 DIAGNOSIS — Z7901 Long term (current) use of anticoagulants: Secondary | ICD-10-CM | POA: Diagnosis not present

## 2020-03-17 DIAGNOSIS — J9601 Acute respiratory failure with hypoxia: Secondary | ICD-10-CM | POA: Diagnosis not present

## 2020-03-17 DIAGNOSIS — N186 End stage renal disease: Secondary | ICD-10-CM | POA: Diagnosis not present

## 2020-03-17 DIAGNOSIS — N189 Chronic kidney disease, unspecified: Secondary | ICD-10-CM | POA: Diagnosis not present

## 2020-03-17 LAB — RENAL FUNCTION PANEL
Albumin: 2.9 g/dL — ABNORMAL LOW (ref 3.5–5.0)
Anion gap: 15 (ref 5–15)
BUN: 39 mg/dL — ABNORMAL HIGH (ref 6–20)
CO2: 22 mmol/L (ref 22–32)
Calcium: 9.9 mg/dL (ref 8.9–10.3)
Chloride: 99 mmol/L (ref 98–111)
Creatinine, Ser: 7.26 mg/dL — ABNORMAL HIGH (ref 0.61–1.24)
GFR, Estimated: 9 mL/min — ABNORMAL LOW (ref >60)
Glucose, Bld: 250 mg/dL — ABNORMAL HIGH (ref 70–99)
Phosphorus: 5.1 mg/dL — ABNORMAL HIGH (ref 2.5–4.6)
Potassium: 4.3 mmol/L (ref 3.5–5.1)
Sodium: 136 mmol/L (ref 135–145)

## 2020-03-17 LAB — GLUCOSE, CAPILLARY
Glucose-Capillary: 299 mg/dL — ABNORMAL HIGH (ref 70–99)
Glucose-Capillary: 204 mg/dL — ABNORMAL HIGH (ref 70–99)
Glucose-Capillary: 179 mg/dL — ABNORMAL HIGH (ref 70–99)
Glucose-Capillary: 184 mg/dL — ABNORMAL HIGH (ref 70–99)
Glucose-Capillary: 179 mg/dL — ABNORMAL HIGH (ref 70–99)
Glucose-Capillary: 293 mg/dL — ABNORMAL HIGH (ref 70–99)

## 2020-03-17 LAB — CBC
HCT: 30.5 % — ABNORMAL LOW (ref 39.0–52.0)
Hemoglobin: 9.5 g/dL — ABNORMAL LOW (ref 13.0–17.0)
MCH: 28.3 pg (ref 26.0–34.0)
MCHC: 31.1 g/dL (ref 30.0–36.0)
MCV: 90.8 fL (ref 80.0–100.0)
Platelets: 348 10*3/uL (ref 150–400)
RBC: 3.36 MIL/uL — ABNORMAL LOW (ref 4.22–5.81)
RDW: 18.6 % — ABNORMAL HIGH (ref 11.5–15.5)
WBC: 14.4 10*3/uL — ABNORMAL HIGH (ref 4.0–10.5)
nRBC: 0.2 % (ref 0.0–0.2)

## 2020-03-17 LAB — HEPARIN LEVEL (UNFRACTIONATED)
Heparin Unfractionated: 1.02 IU/mL — ABNORMAL HIGH (ref 0.30–0.70)
Heparin Unfractionated: 0.88 IU/mL — ABNORMAL HIGH (ref 0.30–0.70)
Heparin Unfractionated: 0.42 IU/mL (ref 0.30–0.70)

## 2020-03-17 LAB — PROTIME-INR
INR: 1.3 — ABNORMAL HIGH (ref 0.8–1.2)
Prothrombin Time: 16.1 seconds — ABNORMAL HIGH (ref 11.4–15.2)

## 2020-03-17 LAB — MAGNESIUM: Magnesium: 2.6 mg/dL — ABNORMAL HIGH (ref 1.7–2.4)
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
## 2020-03-17 NOTE — Progress Notes (Signed)
[place] for IV heparin>>warfarin Indication: Hx PE, Chronic DVT  Allergies  Allergen Reactions  . Doxycycline Hives  . Latex Swelling    Pt reports swelling at site.   . Morphine And Related Anxiety    Patient Measurements: Height: '5\' 9"'$  (175.3 cm) Weight: 125.4 kg (276 lb 6.4 oz) IBW/kg (Calculated) : 70.7 Heparin Dosing Weight: 95 kg  Vital Signs: Temp: 98.7 F (37.1 C) (02/19 2323) Temp Source: Oral (02/20 0225) BP: 174/115 (02/20 0225) Pulse Rate: 94 (02/20 0225)  Labs: Recent Labs    03/15/20 0605 03/16/20 0210 03/16/20 1024 03/16/20 1816 03/16/20 2121 03/17/20 0210 03/17/20 0217  HGB 10.5* 9.2*  --   --   --  9.5*  --   HCT 34.8* 30.1*  --   --   --  30.5*  --   PLT 274 279  --   --   --  348  --   LABPROT 13.9 13.8  --   --   --  16.1*  --   INR 1.1 1.1  --   --   --  1.3*  --   HEPARINUNFRC 1.20* 0.35   < > 1.12* 0.66  --  1.02*  CREATININE 5.58* 6.37*  --   --   --   --  7.26*   < > = values in this interval not displayed.    Estimated Creatinine Clearance: 17.2 mL/min (A) (by C-G formula based on SCr of 7.26 mg/dL (H)).   Assessment: 89 yoM on warfarin PTA for hx PE and chronic DVTs admitted s/p cardiac arrest. Warfarin held, heparin started 2/12 with INR <2.  Heparin level 1.02 (supratherapeutic). RN confirmed that this level was a foot stick so should be accurate. No bleeding noted.   Goal of Therapy:  Heparin level 0.3-0.7 units/ml Monitor platelets by anticoagulation protocol: Yes   Plan:  Decrease heparin to 2400 units/hr  Will f/u 8 hr heparin level (footstick if possible)  Sherlon Handing, PharmD, BCPS Please see amion for complete clinical pharmacist phone list  03/17/2020 3:19 AM

## 2020-03-17 NOTE — Progress Notes (Signed)
Patient ID: Max Pittman., male   DOB: [birth year], 45 y.o.   MRN: [id number] [facility] Progress Note   Assessment/ Plan:   1.  Acute hypoxic respiratory failure: Secondary to' \\volume'$  overload/pulmonary edema resulting in asystolic cardiac arrest.  On intermittent oxygen supplementation via nasal cannula following self extubation and continues to maintain decent oxygenation. 2. ESRD: He had anasarca/volume overload on admission raising concerns for modality failure versus nonadherence with peritoneal dialysis. Transiently on CRRT with net negative fluid balance of 31 L for aggressive volume unloading.  With CCPD overnight with ultrafiltration of 0.5 L but surprisingly 2L negative on I/O with weight loss of 1.2kg.  I will order for peritoneal dialysis again tonight and if ultrafiltration is unsatisfactory, I informed him that he will to transition modality to hemodialysis via [facility] (will need conversion of temp cath).  He would like to do home hemodialysis which I encouraged him to discuss with his outpatient nephrologist.  3. Anemia: Without overt blood loss, low hemoglobin/hematocrit noted, continue ESA. 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 5. Nutrition: On renal diet without any evidence of aspiration. 6. Hypertension: Blood pressures intermittently elevated, monitor with peritoneal dialysis/ultrafiltration.  Subjective:   Reports to be feeling well without chest pain or shortness of breath- he ambulated around hallway with assistance yesterday.   Objective:   BP (!) 154/96 (BP Location: Right Arm)   Pulse 94   Temp 98.3 F (36.8 C) (Oral)   Resp 20   Ht '5\' 9"'$  (1.753 m)   Wt 123.8 kg   SpO2 98%   BMI 40.30 kg/m   Physical Exam: Gen: Resting comfortably in bed CVS: Pulse regular rhythm, normal rate, S1 and S2 normal.  Right IJ temporary dialysis catheter Resp: With expiratory rhonchi/wheeze audible, no  rales bilaterally Abd: Soft, obese, nontender, PD catheter in situ Ext: Trace pitting edema over lower extremities, 1+ edema over lower back. Thrombosed LUA AVF   Intake/Output Summary (Last 24 hours) at 03/17/2020 0905 Last data filed at 03/17/2020 0421 Gross per 24 hour  Intake 828.39 ml  Output -  Net 828.39 ml     Labs: BMET Recent Labs  Lab 03/12/20 0038 03/12/20 1600 03/13/20 0351 03/14/20 0233 03/15/20 0605 03/16/20 0210 03/17/20 0217  NA 136 134* 135 135 137 137 136  K 4.4 4.1 4.3 4.0 4.0 3.9 4.3  CL 101 100 100 101 98 101 99  CO2 '24 25 25 24 24 22 22  '$ GLUCOSE 142* 142* 138* 121* 156* 249* 250*  BUN '13 11 9 '$ 25* 34* 38* 39*  CREATININE 2.27* 2.20* 1.83* 3.84* 5.58* 6.37* 7.26*  CALCIUM 9.2 9.1 9.3 9.4 10.3 9.7 9.9  PHOS 1.6* 2.6 2.2* 4.6 4.3 4.2 5.1*   CBC Recent Labs  Lab 03/13/20 0351 03/14/20 0233 03/15/20 0605 03/16/20 0210 03/17/20 0210  WBC 8.7 12.2* 15.1* 12.7* 14.4*  NEUTROABS 4.2 5.6  --   --   --   HGB 9.1* 8.0* 10.5* 9.2* 9.5*  HCT 28.9* 27.0* 34.8* 30.1* 30.5*  MCV 90.0 92.5 91.6 91.5 90.8  PLT 187 196 274 279 348     Medications:    . (feeding supplement) PROSource Plus  30 mL Oral BID WC  . albuterol  2.5 mg Nebulization BID  . amLODipine  5 mg Oral Daily  . aspirin  81 mg Oral Daily  . atorvastatin  40 mg Oral q1800  . buPROPion  150 mg Oral  BID WC  . cephALEXin  250 mg Oral Q12H  . Chlorhexidine Gluconate Cloth  6 each Topical Daily  . docusate sodium  100 mg Oral BID  . feeding supplement  237 mL Oral BID BM  . gentamicin cream  1 application Topical Daily  . insulin aspart  0-20 Units Subcutaneous Q4H  . lamoTRIgine  100 mg Oral BID  . levETIRAcetam  500 mg Oral Daily  . mouth rinse  15 mL Mouth Rinse BID  . melatonin  5 mg Oral QHS  . metoprolol tartrate  12.5 mg Oral BID  . multivitamin  1 tablet Oral QHS  . pantoprazole  40 mg Oral Daily  . polyethylene glycol  17 g Oral Daily  . sodium chloride flush  10-40 mL  Intracatheter Q12H  . Warfarin - Pharmacist Dosing Inpatient   Does not apply q1600   Elmarie Shiley, MD 03/17/2020, 9:05 AM

## 2020-03-17 NOTE — Progress Notes (Signed)
[place] for IV heparin>>warfarin Indication: Hx PE, Chronic DVT  Allergies  Allergen Reactions  . Doxycycline Hives  . Latex Swelling    Pt reports swelling at site.   . Morphine And Related Anxiety    Patient Measurements: Height: '5\' 9"'$  (175.3 cm) Weight: 123.8 kg (272 lb 14.9 oz) IBW/kg (Calculated) : 70.7 Heparin Dosing Weight: 95 kg  Vital Signs: Temp: 98.2 F (36.8 C) (02/20 1102) Temp Source: Oral (02/20 1102) BP: 160/99 (02/20 1102) Pulse Rate: 91 (02/20 1102)  Labs: Recent Labs    03/15/20 0605 03/16/20 0210 03/16/20 1024 03/16/20 2121 03/17/20 0210 03/17/20 0217 03/17/20 1116  HGB 10.5* 9.2*  --   --  9.5*  --   --   HCT 34.8* 30.1*  --   --  30.5*  --   --   PLT 274 279  --   --  348  --   --   LABPROT 13.9 13.8  --   --  16.1*  --   --   INR 1.1 1.1  --   --  1.3*  --   --   HEPARINUNFRC 1.20* 0.35   < > 0.66  --  1.02* 0.88*  CREATININE 5.58* 6.37*  --   --   --  7.26*  --    < > = values in this interval not displayed.    Estimated Creatinine Clearance: 17.1 mL/min (A) (by C-G formula based on SCr of 7.26 mg/dL (H)).   Assessment: 64 yoM on warfarin PTA for hx PE and chronic DVTs admitted s/p cardiac arrest. Warfarin held, heparin started 2/12 with INR <2.  Heparin level 0.88 (supratherapeutic).  No bleeding noted. CBC stable.  Goal of Therapy:  Heparin level 0.3-0.7 units/ml Monitor platelets by anticoagulation protocol: Yes   Plan:  Decrease heparin to 2100 units/hr  Will f/u 8 hr heparin level (footstick if possible) Daily HL and CBC  Norina Buzzard, PharmD PGY1 Pharmacy Resident 03/17/2020 12:18 PM

## 2020-03-17 NOTE — Progress Notes (Signed)
[place] for IV heparin>>warfarin Indication: Hx PE, Chronic DVT  Allergies  Allergen Reactions  . Doxycycline Hives  . Latex Swelling    Pt reports swelling at site.   . Morphine And Related Anxiety    Patient Measurements: Height: '5\' 9"'$  (175.3 cm) Weight: 124.2 kg (273 lb 13 oz) IBW/kg (Calculated) : 70.7 Heparin Dosing Weight: 95 kg  Vital Signs: Temp: 98.2 F (36.8 C) (02/20 2015) Temp Source: Oral (02/20 2015) BP: 156/99 (02/20 2015) Pulse Rate: 101 (02/20 1955)  Labs: Recent Labs    03/15/20 0605 03/16/20 0210 03/16/20 1024 03/17/20 0210 03/17/20 0217 03/17/20 1116 03/17/20 2038  HGB 10.5* 9.2*  --  9.5*  --   --   --   HCT 34.8* 30.1*  --  30.5*  --   --   --   PLT 274 279  --  348  --   --   --   LABPROT 13.9 13.8  --  16.1*  --   --   --   INR 1.1 1.1  --  1.3*  --   --   --   HEPARINUNFRC 1.20* 0.35   < >  --  1.02* 0.88* 0.42  CREATININE 5.58* 6.37*  --   --  7.26*  --   --    < > = values in this interval not displayed.    Estimated Creatinine Clearance: 17.1 mL/min (A) (by C-G formula based on SCr of 7.26 mg/dL (H)).   Assessment: 35 yoM on warfarin PTA for hx PE and chronic DVTs admitted s/p cardiac arrest. Warfarin held, heparin started 2/12 with INR <2.  Heparin level 0.42 (supratherapeutic).  No bleeding noted. CBC stable.  Goal of Therapy:  Heparin level 0.3-0.7 units/ml Monitor platelets by anticoagulation protocol: Yes   Plan:  Continue IV heparin at 2100 units/hr  Daily heparin level and CBC  [place] Crane, Vena Austria, BCPS, [facility] Clinical Pharmacist  03/17/2020 9:26 PM   [facility] pharmacy phone numbers are listed on [email or web address]

## 2020-03-18 DIAGNOSIS — J9601 Acute respiratory failure with hypoxia: Secondary | ICD-10-CM | POA: Diagnosis not present

## 2020-03-18 DIAGNOSIS — Z7901 Long term (current) use of anticoagulants: Secondary | ICD-10-CM | POA: Diagnosis not present

## 2020-03-18 DIAGNOSIS — N186 End stage renal disease: Secondary | ICD-10-CM | POA: Diagnosis not present

## 2020-03-18 DIAGNOSIS — N189 Chronic kidney disease, unspecified: Secondary | ICD-10-CM | POA: Diagnosis not present

## 2020-03-18 LAB — RENAL FUNCTION PANEL
Albumin: 2.5 g/dL — ABNORMAL LOW (ref 3.5–5.0)
Anion gap: 16 — ABNORMAL HIGH (ref 5–15)
BUN: 46 mg/dL — ABNORMAL HIGH (ref 6–20)
CO2: 23 mmol/L (ref 22–32)
Calcium: 9.5 mg/dL (ref 8.9–10.3)
Chloride: 96 mmol/L — ABNORMAL LOW (ref 98–111)
Creatinine, Ser: 7.91 mg/dL — ABNORMAL HIGH (ref 0.61–1.24)
GFR, Estimated: 8 mL/min — ABNORMAL LOW (ref >60)
Glucose, Bld: 257 mg/dL — ABNORMAL HIGH (ref 70–99)
Phosphorus: 5.4 mg/dL — ABNORMAL HIGH (ref 2.5–4.6)
Potassium: 3.5 mmol/L (ref 3.5–5.1)
Sodium: 135 mmol/L (ref 135–145)

## 2020-03-18 LAB — CBC
HCT: 27 % — ABNORMAL LOW (ref 39.0–52.0)
Hemoglobin: 8.7 g/dL — ABNORMAL LOW (ref 13.0–17.0)
MCH: 29.1 pg (ref 26.0–34.0)
MCHC: 32.2 g/dL (ref 30.0–36.0)
MCV: 90.3 fL (ref 80.0–100.0)
Platelets: 373 10*3/uL (ref 150–400)
RBC: 2.99 MIL/uL — ABNORMAL LOW (ref 4.22–5.81)
RDW: 18.4 % — ABNORMAL HIGH (ref 11.5–15.5)
WBC: 14.8 10*3/uL — ABNORMAL HIGH (ref 4.0–10.5)
nRBC: 0 % (ref 0.0–0.2)

## 2020-03-18 LAB — GLUCOSE, CAPILLARY
Glucose-Capillary: 111 mg/dL — ABNORMAL HIGH (ref 70–99)
Glucose-Capillary: 138 mg/dL — ABNORMAL HIGH (ref 70–99)
Glucose-Capillary: 188 mg/dL — ABNORMAL HIGH (ref 70–99)
Glucose-Capillary: 201 mg/dL — ABNORMAL HIGH (ref 70–99)
Glucose-Capillary: 222 mg/dL — ABNORMAL HIGH (ref 70–99)
Glucose-Capillary: 227 mg/dL — ABNORMAL HIGH (ref 70–99)

## 2020-03-18 LAB — HEPARIN LEVEL (UNFRACTIONATED)
Heparin Unfractionated: 0.12 IU/mL — ABNORMAL LOW (ref 0.30–0.70)
Heparin Unfractionated: 0.2 IU/mL — ABNORMAL LOW (ref 0.30–0.70)
Heparin Unfractionated: <0.1 IU/mL — ABNORMAL LOW (ref 0.30–0.70)

## 2020-03-18 LAB — PROTIME-INR
INR: 1.4 — ABNORMAL HIGH (ref 0.8–1.2)
Prothrombin Time: 16.3 seconds — ABNORMAL HIGH (ref 11.4–15.2)

## 2020-03-18 LAB — MAGNESIUM: Magnesium: 2.3 mg/dL (ref 1.7–2.4)

## 2020-03-18 MED ORDER — WARFARIN SODIUM 4 MG PO TABS: 4.0000 mg | ORAL_TABLET | Freq: Once | ORAL | Status: DC

## 2020-03-18 MED ORDER — CEFAZOLIN SODIUM-DEXTROSE 2-4 GM/100ML-% IV SOLN
2.0000 g | Freq: Once | INTRAVENOUS | Status: AC
  Administered 2020-03-19: 2 g via INTRAVENOUS
  Filled 2020-03-18: qty 100

## 2020-03-18 NOTE — Progress Notes (Addendum)
[place] for IV heparin>>warfarin Indication: Hx PE, Chronic DVT  Allergies  Allergen Reactions  . Doxycycline Hives  . Latex Swelling    Pt reports swelling at site.   . Morphine And Related Anxiety    Patient Measurements: Height: '5\' 9"'$  (175.3 cm) Weight: 123.5 kg (272 lb 5.7 oz) IBW/kg (Calculated) : 70.7 Heparin Dosing Weight: 95 kg  Vital Signs: Temp: 99.2 F (37.3 C) (02/21 1052) Temp Source: Oral (02/21 1052) BP: 140/82 (02/21 1052) Pulse Rate: 96 (02/21 1052)  Labs: Recent Labs    03/16/20 0210 03/16/20 1024 03/17/20 0210 03/17/20 0217 03/17/20 1116 03/17/20 2038 03/18/20 0128  HGB 9.2*  --  9.5*  --   --   --  8.7*  HCT 30.1*  --  30.5*  --   --   --  27.0*  PLT 279  --  348  --   --   --  373  LABPROT 13.8  --  16.1*  --   --   --  16.3*  INR 1.1  --  1.3*  --   --   --  1.4*  HEPARINUNFRC 0.35   < >  --  1.02* 0.88* 0.42 0.12*  CREATININE 6.37*  --   --  7.26*  --   --  7.91*   < > = values in this interval not displayed.    Estimated Creatinine Clearance: 15.6 mL/min (A) (by C-G formula based on SCr of 7.91 mg/dL (H)).   Assessment: 80 yoM on warfarin PTA for hx PE and chronic DVTs admitted s/p cardiac arrest. Warfarin held, heparin started 2/12 with INR <2.  Heparin level still subtherapeutic (0.2) on gtt at 2300 units/hr.  No issues with line or bleeding reported. No warfarin given yesterday, IR now planning HD cath 2/22, will hold warfarin tonight.   Goal of Therapy:  INR goal 2-3 Heparin level 0.3-0.7 units/ml Monitor platelets by anticoagulation protocol: Yes   Plan:  Hold warfarin Increase IV heparin to 2500 units/hr  F/u 8 hr heparin level  Erin Hearing PharmD., BCPS Clinical Pharmacist 03/18/2020 11:02 AM

## 2020-03-18 NOTE — Progress Notes (Signed)
[place] for IV heparin>>warfarin Indication: Hx PE, Chronic DVT  Allergies  Allergen Reactions  . Doxycycline Hives  . Latex Swelling    Pt reports swelling at site.   . Morphine And Related Anxiety    Patient Measurements: Height: '5\' 9"'$  (175.3 cm) Weight: 124.2 kg (273 lb 13 oz) IBW/kg (Calculated) : 70.7 Heparin Dosing Weight: 95 kg  Vital Signs: Temp: 98.2 F (36.8 C) (02/20 2313) Temp Source: Oral (02/20 2313) BP: 150/88 (02/20 2313) Pulse Rate: 97 (02/20 2313)  Labs: Recent Labs    03/15/20 0605 03/16/20 0210 03/16/20 1024 03/17/20 0210 03/17/20 0217 03/17/20 1116 03/17/20 2038 03/18/20 0128  HGB 10.5* 9.2*  --  9.5*  --   --   --  8.7*  HCT 34.8* 30.1*  --  30.5*  --   --   --  27.0*  PLT 274 279  --  348  --   --   --  373  LABPROT 13.9 13.8  --  16.1*  --   --   --  16.3*  INR 1.1 1.1  --  1.3*  --   --   --  1.4*  HEPARINUNFRC 1.20* 0.35   < >  --  1.02* 0.88* 0.42 0.12*  CREATININE 5.58* 6.37*  --   --  7.26*  --   --   --    < > = values in this interval not displayed.    Estimated Creatinine Clearance: 17.1 mL/min (A) (by C-G formula based on SCr of 7.26 mg/dL (H)).   Assessment: 11 yoM on warfarin PTA for hx PE and chronic DVTs admitted s/p cardiac arrest. Warfarin held, heparin started 2/12 with INR <2.  Heparin level down to subtherapeutic (0.12) on gtt at 2100 units/hr.  No issues with line or bleeding reported per RN. Perhaps it is taking a while to see effects of heparin changes in this patient due to decreased renal function.  Goal of Therapy:  Heparin level 0.3-0.7 units/ml Monitor platelets by anticoagulation protocol: Yes   Plan:  Increase IV heparin to 2300 units/hr  F/u 8 hr heparin level  Sherlon Handing, PharmD, BCPS Please see amion for complete clinical pharmacist phone list  03/18/2020 2:50 AM

## 2020-03-18 NOTE — Consult Note (Signed)
Patient Status: [facility] - In-pt  Assessment and Plan: History of ESRD on PD with possible ultrafiltration failure of PD. Plan for image-guided tunneled HD catheter placement in IR tentatively for tomorrow 03/19/2020 pending IR scheduling. Patient will be NPO at midnight. Afebrile.  Risks and benefits discussed with the patient including, but not limited to bleeding, infection, vascular injury, pneumothorax which may require chest tube placement, air embolism or even death. All of the patient's questions were answered, patient is agreeable to proceed. Consent signed and in IR control room.  ______________________________________________________________________   History of Present Illness: Max Pittman. is a 45 y.o. male with a past medical history of hypertension, CAD, DVT on Coumadin, CVA, seizure disorder, ESRD on PD, diabetes mellitus, OSA, and arthritis. He has been admitted to [facility] since 03/07/2020 for management of cardiac arrest with asystole. He underwent 12 minutes of CPR with ROSC, was intubated, and admitted for further management. Hospital course complicated by pneumonia (has completed course of unasyn at this time), cardiogenic shock (resolved), acute metabolic encephalopathy (back to baseline), and volume overload. Patient was on PD on admission, however required CRRT via right IJ nontunneled HD catheter (placed bedside by [facility] 03/07/2020). Nephrology was also consulted on admission. He has completed CRRT at this time and has been undergoing PD, however questions of possible ultrafiltration failure with PD.  IR consulted by Dr. Jonnie Finner for possible image-guided tunneled HD catheter placement. Patient awake and alert sitting in bed eating lunch with no complaints at this time. Denies fever, chills, chest pain, dyspnea, abdominal pain, or headache.   Allergies and medications reviewed.   Review of Systems: A 12 point ROS discussed and pertinent positives are indicated in the  HPI above.  All other systems are negative.  Review of Systems  Constitutional: Negative for chills and fever.  Respiratory: Negative for shortness of breath and wheezing.   Cardiovascular: Negative for chest pain and palpitations.  Gastrointestinal: Negative for abdominal pain.  Neurological: Negative for headaches.  Psychiatric/Behavioral: Negative for behavioral problems and confusion.    Vital Signs: BP 140/82 (BP Location: Right Arm)   Pulse 96   Temp 99.2 F (37.3 C) (Oral)   Resp 19   Ht '5\' 9"'$  (1.753 m)   Wt 272 lb 5.7 oz (123.5 kg)   SpO2 96%   BMI 40.22 kg/m   Physical Exam Vitals and nursing note reviewed.  Constitutional:      General: He is not in acute distress.    Appearance: Normal appearance.  Cardiovascular:     Rate and Rhythm: Regular rhythm. Tachycardia present.     Heart sounds: Normal heart sounds. No murmur heard.   Pulmonary:     Effort: Pulmonary effort is normal. No respiratory distress.     Breath sounds: Normal breath sounds. No wheezing.  Skin:    General: Skin is warm and dry.  Neurological:     Mental Status: He is alert and oriented to person, place, and time.      Imaging reviewed.   Labs:  COAGS: Recent Labs    03/09/20 0401 03/10/20 0757 03/10/20 1154 03/11/20 0347 03/13/20 0351 03/14/20 0233 03/15/20 0605 03/16/20 0210 03/17/20 0210 03/18/20 0128  INR 1.9* 2.3*  --    < > 1.1   < > 1.1 1.1 1.3* 1.4*  APTT 50* >200* 116*  --  38*  --   --   --   --   --    < > = values in this  interval not displayed.    BMP: Recent Labs    07/26/19 0441 07/27/19 0323 07/28/19 0425 07/28/19 1305 12/18/19 1106 03/07/20 0239 03/15/20 0605 03/16/20 0210 03/17/20 0217 03/18/20 0128  NA 136 137 139  --  143   < > 137 137 136 135  K 3.5 3.3* 2.7*   < > 3.9   < > 4.0 3.9 4.3 3.5  CL 101 101 101  --  103   < > 98 101 99 96*  CO2 '25 26 25  '$ --  32   < > '24 22 22 23  '$ GLUCOSE 227* 180* 114*  --  117*   < > 156* 249* 250* 257*   BUN 43* 47* 47*  --  25   < > 34* 38* 39* 46*  CALCIUM 9.0 9.0 9.0  --  9.2   < > 10.3 9.7 9.9 9.5  CREATININE 9.10* 9.43* 9.21*  --  7.58*   < > 5.58* 6.37* 7.26* 7.91*  GFRNONAA 6* 6* 6*  --  8*   < > 12* 10* 9* 8*  GFRAA 7* 7* 7*  --  9*  --   --   --   --   --    < > = values in this interval not displayed.       Electronically Signed: Earley Abide, PA-C 03/18/2020, 2:23 PM   I spent a total of 15 minutes in face to face in clinical consultation, greater than 50% of which was counseling/coordinating care for HD access.

## 2020-03-18 NOTE — Progress Notes (Signed)
[place] for IV heparin>>warfarin Indication: Hx PE, Chronic DVT  Allergies  Allergen Reactions  . Doxycycline Hives  . Latex Swelling    Pt reports swelling at site.   . Morphine And Related Anxiety    Patient Measurements: Height: '5\' 9"'$  (175.3 cm) Weight: 123.5 kg (272 lb 5.7 oz) IBW/kg (Calculated) : 70.7 Heparin Dosing Weight: 95 kg  Vital Signs: Temp: 99.6 F (37.6 C) (02/21 1930) Temp Source: Oral (02/21 1930) BP: 155/95 (02/21 1930) Pulse Rate: 112 (02/21 1930)  Labs: Recent Labs    03/16/20 0210 03/16/20 1024 03/17/20 0210 03/17/20 0217 03/17/20 1116 03/18/20 0128 03/18/20 1115 03/18/20 2059  HGB 9.2*  --  9.5*  --   --  8.7*  --   --   HCT 30.1*  --  30.5*  --   --  27.0*  --   --   PLT 279  --  348  --   --  373  --   --   LABPROT 13.8  --  16.1*  --   --  16.3*  --   --   INR 1.1  --  1.3*  --   --  1.4*  --   --   HEPARINUNFRC 0.35   < >  --  1.02*   < > 0.12* 0.20* <0.10*  CREATININE 6.37*  --   --  7.26*  --  7.91*  --   --    < > = values in this interval not displayed.    Estimated Creatinine Clearance: 15.6 mL/min (A) (by C-G formula based on SCr of 7.91 mg/dL (H)).   Assessment: 39 yoM on warfarin PTA for hx PE and chronic DVTs admitted s/p cardiac arrest. Warfarin held, heparin started 2/12 with INR <2.  No warfarin given yesterday, IR now planning HD cath 2/22, will hold warfarin tonight.    Heparin level undetectable on increased rate of heparin 2500 units/hr. Per RN no issues with IV access but phlebotomy reported IV paused when she entered the room to obtain lab possibly for patient to go to the bathroom. Level drawn appropriately per phlebotomy. RN checked IV access and IV flushed appropriately.  Goal of Therapy:  INR goal 2-3 Heparin level 0.3-0.7 units/ml Monitor platelets by anticoagulation protocol: Yes   Plan:  Continue heparin at current rate of 2500 units/hr since trend does not seem  reasonable and heparin was paused Obtain 8h heparin level  Monitor heparin level, CBC and s/s of bleeding daily  Follow up plans to resume warfarin  Cristela Felt, PharmD Clinical Pharmacist  03/18/2020 9:52 PM

## 2020-03-18 NOTE — Progress Notes (Signed)
Physical Therapy Treatment Patient Details Name: Max Pittman. MRN: [id number] DOB: 1975/09/28 Today's Date: 03/18/2020    History of Present Illness Max Pittman. is a 45 y.o. male who during early AM hours 2/10,  had worsening dyspnea at home.  Had apparently been experiencing dyspnea and trying to manage it with home peritoneal dialysis.  EMS was called and he initially refused transport to ED; however, after their arrival, he had a cardiac arrest.  Initial rhythm was asystole and he required 12 minutes before ROSC. Pt intubated 2/10, self extubated at 2/14 at midnight.  Also on CRRT.  PMH: including but not limited to ESRD on PD, DM, NSTEMI, HTN, DVT on warfarin, OSA.    PT Comments    Pt received in supine, agreeable to limited supine exercises with encouragement, with fair participation and poor tolerance for mobility due to R hip pain/fatigue. Pt performed mostly LLE A/AAROM therapeutic exercises but reports R hip too painful to attempt, MD/RN notified pt reporting pain not well controlled. Pt seems to have poor insight into deficits and still wants to discharge home once medically cleared although currently requiring increased physical assist with mobility per RN. Pt refusing EOB/OOB mobility due to pain this date. Ice pack given for hip pain. Pt continues to benefit from PT services to progress toward functional mobility goals. Continue to recommend SNF.   Follow Up Recommendations  SNF (if pt refuses SNF, would benefit from HHPT.)     Equipment Recommendations  Rolling walker with 5" wheels;3in1 (PT)    Recommendations for Other Services       Precautions / Restrictions Precautions Precautions: Fall Precaution Comments: CRRT neck access, PD catheter in LLQ Restrictions Weight Bearing Restrictions: No    Mobility  Bed Mobility               General bed mobility comments: pt refusing EOB/OOB mobility    Transfers                     Ambulation/Gait                 Stairs             Wheelchair Mobility    Modified Rankin (Stroke Patients Only)       Balance                                            Cognition Arousal/Alertness: Awake/alert Behavior During Therapy: WFL for tasks assessed/performed Overall Cognitive Status: Within Functional Limits for tasks assessed Area of Impairment: Safety/judgement;Memory;Following commands;Awareness;Problem solving                     Memory: Decreased short-term memory   Safety/Judgement: Decreased awareness of deficits   Problem Solving: Requires verbal cues General Comments: pt with decreased insight, per RN pt requiring increased assist but pt reporting he still plans for home with HHPT despite RLE too painful to get EOB and pt afraid his hip is broken (MD/RN sent secure chat)      Exercises General Exercises - Lower Extremity Ankle Circles/Pumps: AROM;Both;10 reps;Supine Heel Slides: 10 reps;AAROM;Left;Supine Hip ABduction/ADduction: 10 reps;AAROM;Left;Supine Other Exercises Other Exercises: LLE elevated on pillow with heel floated to see if it helps with pain    General Comments General comments (skin integrity, edema, etc.): pt reporting he is "too  weak" for PT, limited insight into importance of mobilizing, plans to go home with HHPT, pain not well controlled, MD/RN notified; HR 98 bpm RR 21 rpm and BP 154/92 supine prior to LE therex      Pertinent Vitals/Pain Pain Assessment: 0-10 Pain Score: 9  Pain Location: R Hip>L hip Pain Descriptors / Indicators: Grimacing;Guarding;Aching Pain Intervention(s): Monitored during session;Repositioned;Patient requesting pain meds-RN notified;Ice applied (ice to R hip, instructed 30 mins on/off)    Home Living                      Prior Function            PT Goals (current goals can now be found in the care plan section) Acute Rehab PT Goals Patient  Stated Goal: I'd like to go home and have Sono[facility] come in. PT Goal Formulation: With patient Time For Goal Achievement: 03/25/20 Potential to Achieve Goals: Fair Progress towards PT goals: Not progressing toward goals - comment (limited participation)    Frequency    Min 3X/week      PT Plan Current plan remains appropriate    Co-evaluation              AM-PAC PT "6 Clicks" Mobility   Outcome Measure  Help needed turning from your back to your side while in a flat bed without using bedrails?: A Little Help needed moving from lying on your back to sitting on the side of a flat bed without using bedrails?: A Little Help needed moving to and from a bed to a chair (including a wheelchair)?: A Little Help needed standing up from a chair using your arms (e.g., wheelchair or bedside chair)?: A Little Help needed to walk in hospital room?: A Lot Help needed climbing 3-5 steps with a railing? : Total 6 Click Score: 15    End of Session   Activity Tolerance: Patient limited by pain Patient left: with call bell/phone within reach;in bed;with bed alarm set Nurse Communication: Mobility status;Need for lift equipment;Patient requests pain meds PT Visit Diagnosis: Muscle weakness (generalized) (M62.81)     Time: [id number] PT Time Calculation (min) (ACUTE ONLY): 16 min  Charges:  $Therapeutic Exercise: 8-22 mins                     Mireyah Chervenak P., PTA Acute Rehabilitation Services Pager: [phone] Office: [place] 03/18/2020, 5:30 PM
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
## 2020-03-18 NOTE — Progress Notes (Signed)
Inpatient Diabetes Program Recommendations  AACE/ADA: New Consensus Statement on Inpatient Glycemic Control (2015)  Target Ranges:  Prepandial:   less than 140 mg/dL      Peak postprandial:   less than 180 mg/dL (1-2 hours)      Critically ill patients:  140 - 180 mg/dL   Lab Results  Component Value Date   GLUCAP 227 (H) 03/18/2020   HGBA1C 5.6 03/07/2020    Review of Glycemic Control Results for Max Pittman, Max Pittman (MRN [id number]) as of 03/18/2020 09:43  Ref. Range 03/17/2020 07:56 03/17/2020 10:57 03/17/2020 16:12 03/17/2020 19:54 03/17/2020 23:12 03/18/2020 04:04 03/18/2020 07:49  Glucose-Capillary Latest Ref Range: 70 - 99 mg/dL 204 (H) 179 (H) 184 (H) 179 (H) 293 (H) 222 (H) 227 (H)   Diabetes history: DM 2 Outpatient Diabetes medications: Actos in the past Current orders for Inpatient glycemic control:  Novolog "Resistant" 0-20 units Q4 hours  Inpatient Diabetes Program Recommendations:    Glucose trends above inpatient goal on Novolog Resistant scale - Add Levemir 15 units - Reduce Novolog Correction to "Moderate" 0-15 units  Thanks,  Tama Headings RN, MSN, BC-ADM Inpatient Diabetes Coordinator Team Pager [phone] (8a-5p)

## 2020-03-18 NOTE — Progress Notes (Signed)
   03/18/20 1200  Acute Rehab PT Goals  PT Goal Formulation With patient  Time For Goal Achievement 04/01/20  Potential to Achieve Goals Good  Goals revised.  Pt is progressing and needed goal update. Will continue PT. Thanks. Dawn W,PT Acute Rehabilitation Services Pager:  [phone]  Office:  [phone]

## 2020-03-18 NOTE — Progress Notes (Addendum)
Patient ID: Max Pittman., male   DOB: Dec 29, 1975, 45 y.o.   MRN: [id number] [facility] Progress Note   Assessment/ Plan:   1.  Acute hypoxic respiratory failure: Secondary to' \\volume'$  overload/pulmonary edema resulting in asystolic cardiac arrest.  Now is on nasal cannula O2.  Lungs clear on exam and no edema, down to 124kg (159 on admit).  2. ESRD: He had anasarca/volume overload on admission raising concerns for modality failure versus nonadherence with peritoneal dialysis.  Was on CRRT for 6-7 days with net negative fluid balance of 31 L. Wt's dropped from 158 to 122 kg w/ CRRT. Now w/ CPPD the UF last night w/ two 4.25% bags and one 2.5% bag was only 570 cc overnight , and UF the night before was only 750 cc. He may have UF failure. Will ask IR to see for changing out temp HD cath to a TDC. Hold coumadin until [facility] in place, have d/w primary nephrologist (Dr Juleen China) and [facility] attending.   3. Anemia: Without overt blood loss, low hemoglobin/hematocrit noted, continue ESA. 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 5. Nutrition: On renal diet without any evidence of aspiration. 6. Hypertension: Blood pressures intermittently elevated, monitor with peritoneal dialysis/ultrafiltration. 7. 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    [name], MD 03/18/2020, 11:31 AM    Subjective:   Seen in room, no c/o today. No cough or sob   Objective:   BP 140/82 (BP Location: Right Arm)   Pulse 96   Temp 99.2 F (37.3 C) (Oral)   Resp 19   Ht '5\' 9"'$  (1.753 m)   Wt 123.5 kg   SpO2 96%   BMI 40.22 kg/m   Physical Exam: Gen: Resting comfortably in bed CVS: Pulse regular rhythm, normal rate, S1 and S2 normal.  Right IJ temporary dialysis catheter Resp: With expiratory rhonchi/wheeze audible, no rales bilaterally Abd: Soft, obese, nontender, PD catheter in situ Ext: no  LE or UE edema  Thrombosed LUA AVF  No intake or output data in the 24 hours ending 03/18/20 1130   Labs: BMET Recent Labs  Lab 03/12/20 1600 03/13/20 0351 03/14/20 0233 03/15/20 0605 03/16/20 0210 03/17/20 0217 03/18/20 0128  NA 134* 135 135 137 137 136 135  K 4.1 4.3 4.0 4.0 3.9 4.3 3.5  CL 100 100 101 98 101 99 96*  CO2 '25 25 24 24 22 22 23  '$ GLUCOSE 142* 138* 121* 156* 249* 250* 257*  BUN 11 9 25* 34* 38* 39* 46*  CREATININE 2.20* 1.83* 3.84* 5.58* 6.37* 7.26* 7.91*  CALCIUM 9.1 9.3 9.4 10.3 9.7 9.9 9.5  PHOS 2.6 2.2* 4.6 4.3 4.2 5.1* 5.4*   CBC Recent Labs  Lab 03/13/20 0351 03/14/20 0233 03/15/20 0605 03/16/20 0210 03/17/20 0210 03/18/20 0128  WBC 8.7 12.2* 15.1* 12.7* 14.4* 14.8*  NEUTROABS 4.2 5.6  --   --   --   --   HGB 9.1* 8.0* 10.5* 9.2* 9.5* 8.7*  HCT 28.9* 27.0* 34.8* 30.1* 30.5* 27.0*  MCV 90.0 92.5 91.6 91.5 90.8 90.3  PLT 187 196 274 279 348 373     Medications:    . (feeding supplement) PROSource Plus  30 mL Oral BID WC  . amLODipine  5 mg Oral Daily  . aspirin  81 mg Oral Daily  . atorvastatin  40 mg Oral q1800  .  buPROPion  150 mg Oral BID WC  . cephALEXin  250 mg Oral Q12H  . Chlorhexidine Gluconate Cloth  6 each Topical Daily  . docusate sodium  100 mg Oral BID  . feeding supplement  237 mL Oral BID BM  . gentamicin cream  1 application Topical Daily  . insulin aspart  0-20 Units Subcutaneous Q4H  . lamoTRIgine  100 mg Oral BID  . levETIRAcetam  500 mg Oral Daily  . mouth rinse  15 mL Mouth Rinse BID  . melatonin  5 mg Oral QHS  . metoprolol tartrate  12.5 mg Oral BID  . multivitamin  1 tablet Oral QHS  . pantoprazole  40 mg Oral Daily  . polyethylene glycol  17 g Oral Daily  . sodium chloride flush  10-40 mL Intracatheter Q12H  . Warfarin - Pharmacist Dosing Inpatient   Does not apply [id number]

## 2020-03-19 ENCOUNTER — Inpatient Hospital Stay (HOSPITAL_COMMUNITY): Payer: Medicare Other

## 2020-03-19 DIAGNOSIS — R9389 Abnormal findings on diagnostic imaging of other specified body structures: Secondary | ICD-10-CM | POA: Diagnosis not present

## 2020-03-19 DIAGNOSIS — N189 Chronic kidney disease, unspecified: Secondary | ICD-10-CM | POA: Diagnosis not present

## 2020-03-19 DIAGNOSIS — G9340 Encephalopathy, unspecified: Secondary | ICD-10-CM | POA: Diagnosis not present

## 2020-03-19 DIAGNOSIS — J9601 Acute respiratory failure with hypoxia: Secondary | ICD-10-CM | POA: Diagnosis not present

## 2020-03-19 LAB — GLUCOSE, CAPILLARY
Glucose-Capillary: 122 mg/dL — ABNORMAL HIGH (ref 70–99)
Glucose-Capillary: 122 mg/dL — ABNORMAL HIGH (ref 70–99)
Glucose-Capillary: 147 mg/dL — ABNORMAL HIGH (ref 70–99)
Glucose-Capillary: 152 mg/dL — ABNORMAL HIGH (ref 70–99)
Glucose-Capillary: 157 mg/dL — ABNORMAL HIGH (ref 70–99)
Glucose-Capillary: 171 mg/dL — ABNORMAL HIGH (ref 70–99)

## 2020-03-19 LAB — RENAL FUNCTION PANEL
Albumin: 2.5 g/dL — ABNORMAL LOW (ref 3.5–5.0)
Anion gap: 13 (ref 5–15)
BUN: 56 mg/dL — ABNORMAL HIGH (ref 6–20)
CO2: 23 mmol/L (ref 22–32)
Calcium: 9.4 mg/dL (ref 8.9–10.3)
Chloride: 96 mmol/L — ABNORMAL LOW (ref 98–111)
Creatinine, Ser: 8.69 mg/dL — ABNORMAL HIGH (ref 0.61–1.24)
GFR, Estimated: 7 mL/min — ABNORMAL LOW (ref >60)
Glucose, Bld: 154 mg/dL — ABNORMAL HIGH (ref 70–99)
Phosphorus: 5.6 mg/dL — ABNORMAL HIGH (ref 2.5–4.6)
Potassium: 4.1 mmol/L (ref 3.5–5.1)
Sodium: 132 mmol/L — ABNORMAL LOW (ref 135–145)

## 2020-03-19 LAB — CBC
HCT: 27.3 % — ABNORMAL LOW (ref 39.0–52.0)
Hemoglobin: 8.8 g/dL — ABNORMAL LOW (ref 13.0–17.0)
MCH: 28.5 pg (ref 26.0–34.0)
MCHC: 32.2 g/dL (ref 30.0–36.0)
MCV: 88.3 fL (ref 80.0–100.0)
Platelets: 446 10*3/uL — ABNORMAL HIGH (ref 150–400)
RBC: 3.09 MIL/uL — ABNORMAL LOW (ref 4.22–5.81)
RDW: 17.9 % — ABNORMAL HIGH (ref 11.5–15.5)
WBC: 20.1 10*3/uL — ABNORMAL HIGH (ref 4.0–10.5)
nRBC: 0 % (ref 0.0–0.2)

## 2020-03-19 LAB — PROTIME-INR
INR: 1.3 — ABNORMAL HIGH (ref 0.8–1.2)
Prothrombin Time: 15.6 seconds — ABNORMAL HIGH (ref 11.4–15.2)

## 2020-03-19 LAB — HEPARIN LEVEL (UNFRACTIONATED): Heparin Unfractionated: 0.44 IU/mL (ref 0.30–0.70)

## 2020-03-19 LAB — MAGNESIUM: Magnesium: 2.4 mg/dL (ref 1.7–2.4)

## 2020-03-19 IMAGING — MR MR LUMBAR SPINE W/O CM
4 of 5 series · 27 of 48 positions shown · non-contrast
Comparison: None.

CLINICAL DATA: Low back pain with possible infection.

EXAM:
MRI LUMBAR SPINE WITHOUT CONTRAST
TECHNIQUE: Multiplanar, multisequence MR imaging of the lumbar spine was
performed. No intravenous contrast was administered.

[Series 5: T2 · sagittal · 4.0mm · 0.73mm/px · 6 of 16 slices shown (1 of 2)]
[im 1/16]
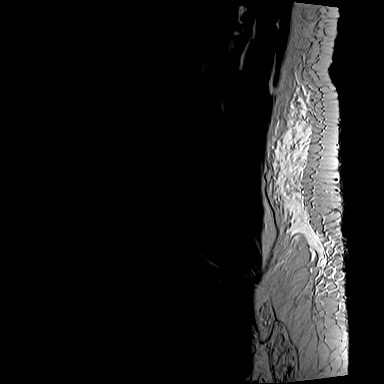
[im 4/16]
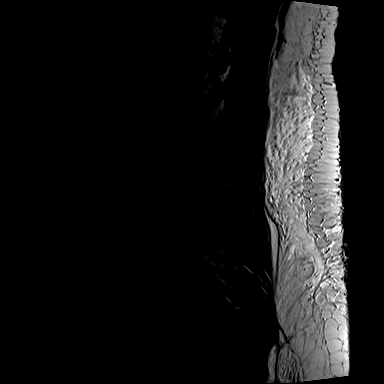
[im 7/16]
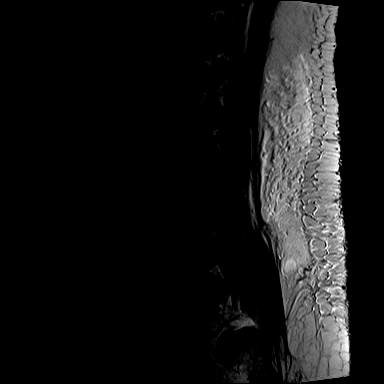
[im 10/16]
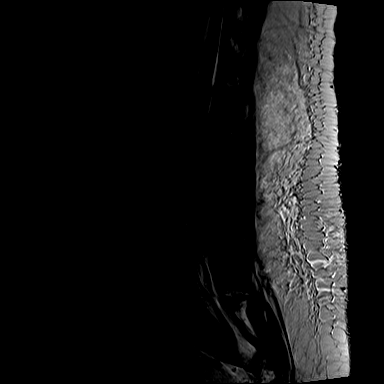
[im 13/16]
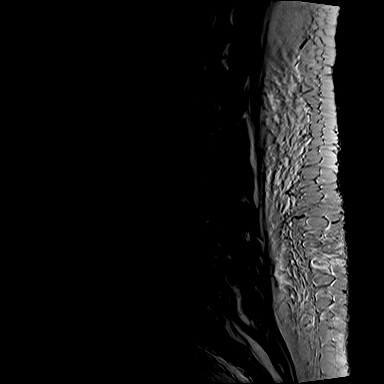
[im 16/16]
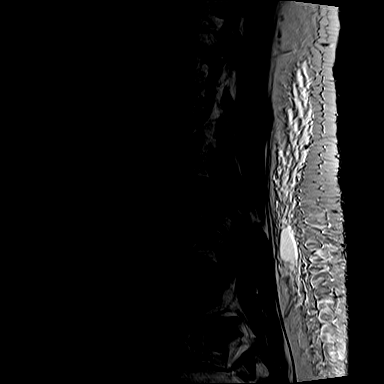

[Series 7: T1 · sagittal · 4.0mm · 0.88mm/px · 7 of 16 slices shown (1 of 2)]
[im 1/16]
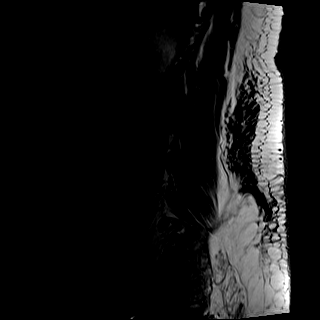
[im 3/16]
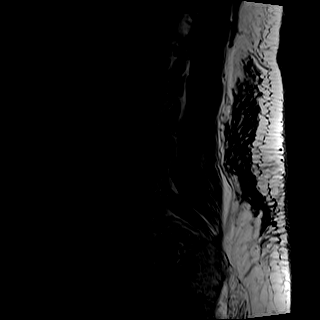
[im 6/16]
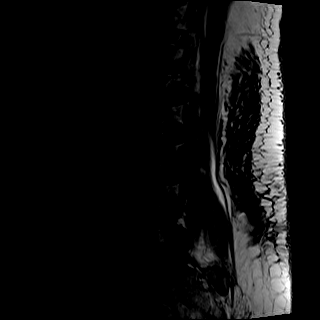
[im 8/16]
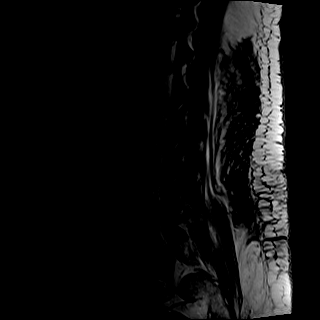
[im 11/16]
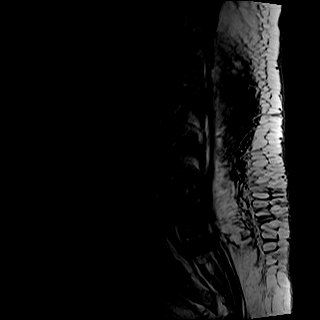
[im 13/16]
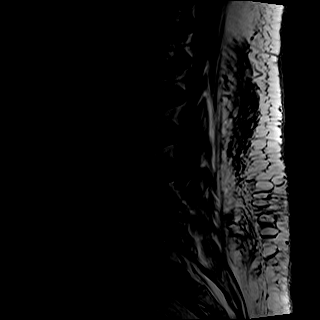
[im 16/16]
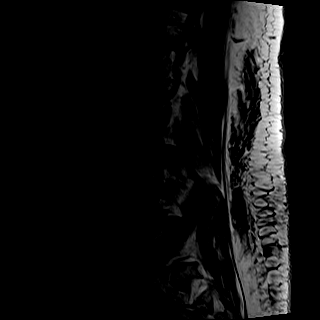

[Series 8: T2 · axial · 4.0mm · 0.57mm/px · z∈[-40,+145]mm · 8 of 33 slices shown (2 of 2)]
[im 1/33]
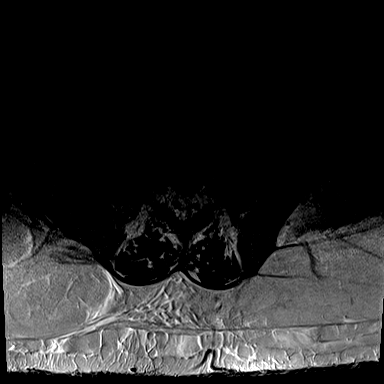
[im 5/33]
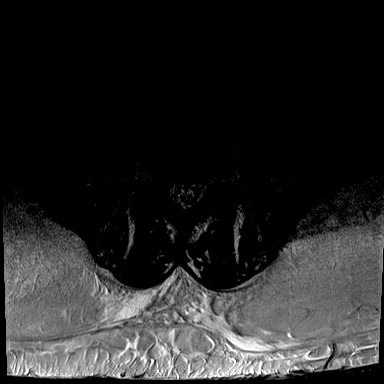
[im 10/33]
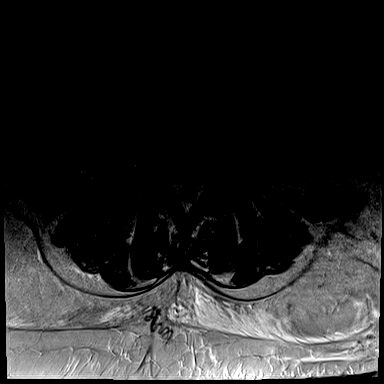
[im 15/33]
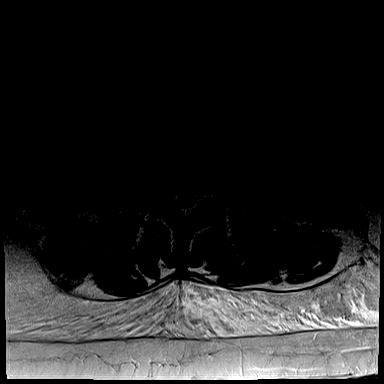
[im 18/33]
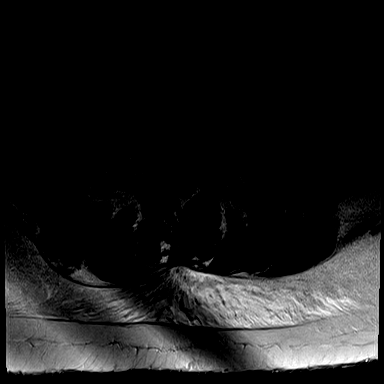
[im 23/33]
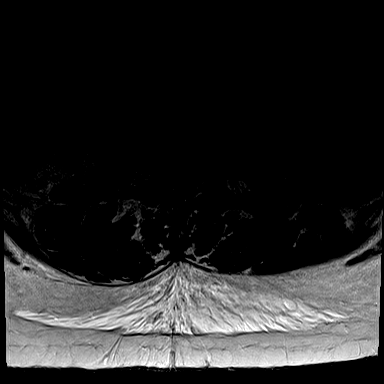
[im 28/33]
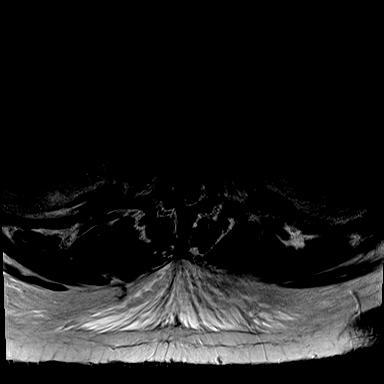
[im 33/33]
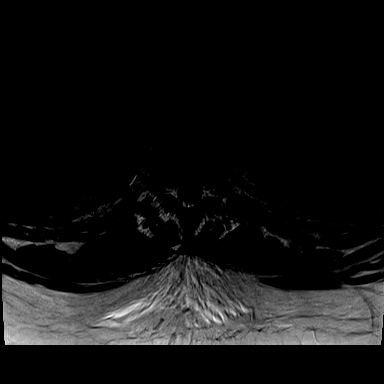

[Series 10: T1 · axial · 4.0mm · 0.34mm/px · z∈[-40,+120]mm · 6 of 33 slices shown (2 of 2)]
[im 1/33]
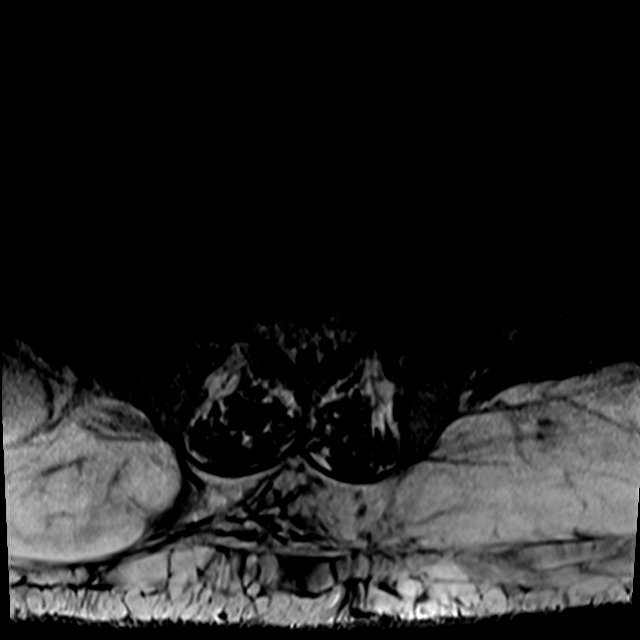
[im 5/33]
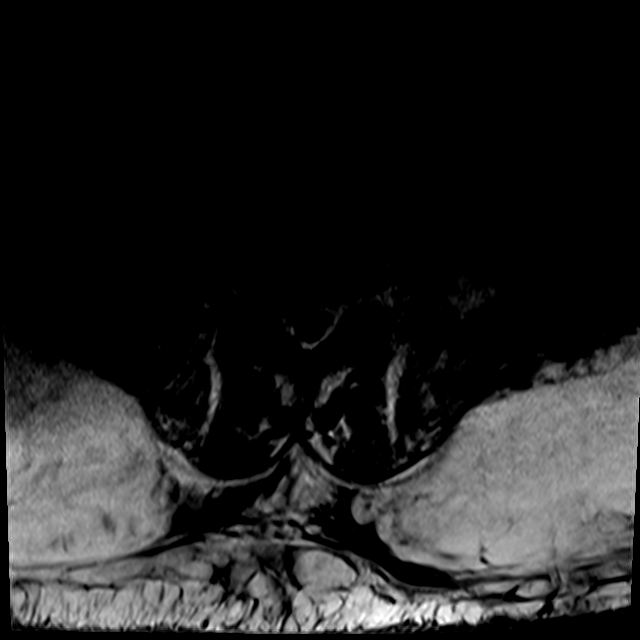
[im 10/33]
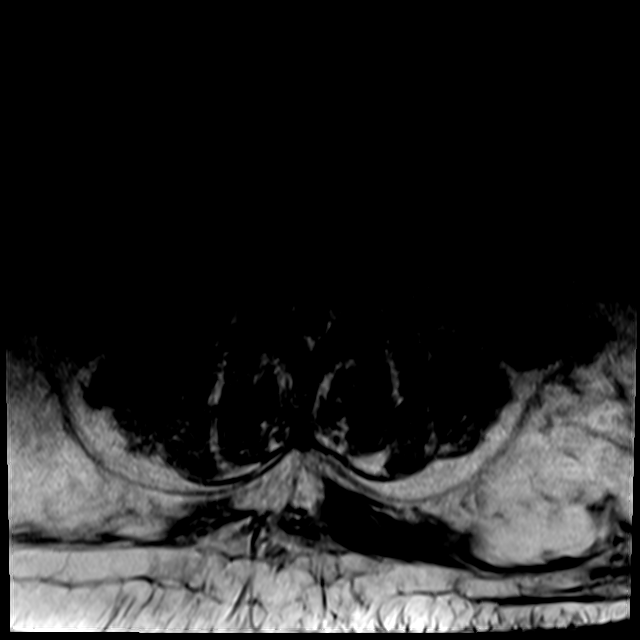
[im 15/33]
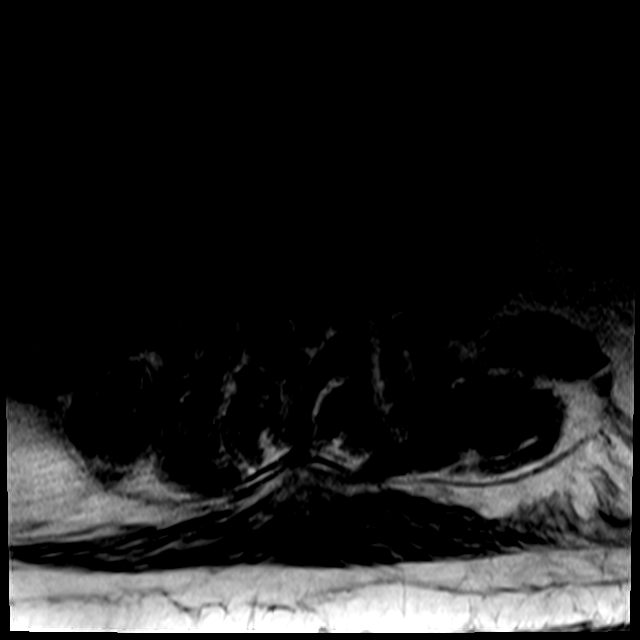
[im 18/33]
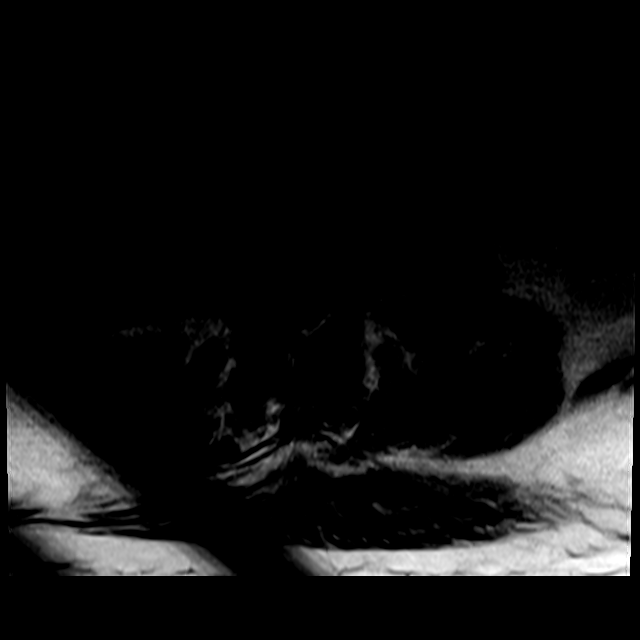
[im 28/33]
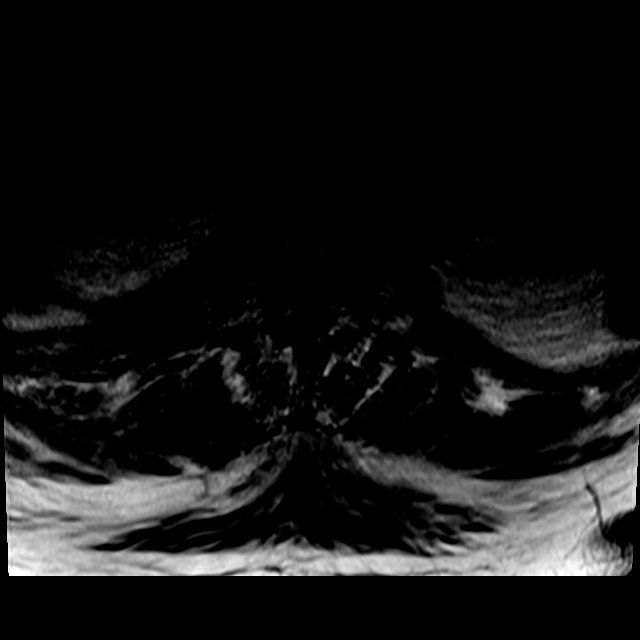

[27 of 48 positions shown; findings below may reference images not displayed]

FINDINGS: Segmentation:  Standard.

Alignment:  Physiologic.

Vertebrae:  No fracture, evidence of discitis, or bone lesion.

Conus medullaris and cauda equina: Conus extends to the L1 level.
Conus and cauda equina appear normal.

Paraspinal and other soft tissues: There is an incompletely
visualized mixed signal intensity collection anterior to the right
iliac bone that measures at least 5.4 cm.

Disc levels:

The disc spaces above L3 are normal.

L3-4: Intermediate disc bulge with mild spinal canal stenosis. No
foraminal stenosis.

L4-5: Small disc bulge without stenosis.

L5-S1: No disc herniation or stenosis.  Moderate facet hypertrophy.
IMPRESSION: 1. Incompletely visualized retroperitoneal collection anterior to
the right iliac. This may be an abscess or hematoma. CT of the
abdomen and pelvis with contrast is recommended.
2. Mild lumbar degenerative disc disease without stenosis.

## 2020-03-19 MED ORDER — HEPARIN 1000 UNIT/ML FOR PERITONEAL DIALYSIS
INTRAPERITONEAL | Status: DC | PRN
  Filled 2020-03-19 (×2): qty 5000

## 2020-03-19 MED ORDER — ONDANSETRON HCL 4 MG/2ML IJ SOLN
INTRAMUSCULAR | Status: AC
  Filled 2020-03-19: qty 2

## 2020-03-19 MED ORDER — DELFLEX-LC/1.5% DEXTROSE 344 MOSM/L IP SOLN: INTRAPERITONEAL | Status: DC

## 2020-03-19 MED ORDER — ONDANSETRON HCL 4 MG/2ML IJ SOLN
4.0000 mg | Freq: Four times a day (QID) | INTRAMUSCULAR | Status: DC | PRN
  Administered 2020-03-21 – 2020-03-25 (×5): 4 mg via INTRAVENOUS
  Filled 2020-03-19 (×5): qty 2

## 2020-03-19 MED ORDER — HYDROMORPHONE HCL 1 MG/ML IJ SOLN
0.5000 mg | INTRAMUSCULAR | Status: DC | PRN
  Administered 2020-03-19 – 2020-03-26 (×11): 0.5 mg via INTRAVENOUS
  Filled 2020-03-19 (×11): qty 1

## 2020-03-19 MED ORDER — GENTAMICIN SULFATE 0.1 % EX CREA
1.0000 "application " | TOPICAL_CREAM | Freq: Every day | CUTANEOUS | Status: DC
  Administered 2020-03-20: 1 via TOPICAL
  Filled 2020-03-19: qty 15

## 2020-03-19 MED ORDER — HEPARIN 1000 UNIT/ML FOR PERITONEAL DIALYSIS: 500.0000 [IU] | INTRAMUSCULAR | Status: DC | PRN

## 2020-03-19 MED ORDER — DELFLEX-LC/2.5% DEXTROSE 394 MOSM/L IP SOLN: INTRAPERITONEAL | Status: DC
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
## 2020-03-19 NOTE — Progress Notes (Signed)
Occupational Therapy Treatment Patient Details Name: Max Pittman. MRN: [id number] DOB: 1975-08-27 Today's Date: 03/19/2020    History of present illness Max Pittman. is a 45 y.o. male who during early AM hours 2/10,  had worsening dyspnea at home.  Had apparently been experiencing dyspnea and trying to manage it with home peritoneal dialysis.  EMS was called and he initially refused transport to ED; however, after their arrival, he had a cardiac arrest.  Initial rhythm was asystole and he required 12 minutes before ROSC. Pt intubated 2/10, self extubated at 2/14 at midnight.  Also on CRRT.  PMH: including but not limited to ESRD on PD, DM, NSTEMI, HTN, DVT on warfarin, OSA.   OT comments  Pt making steady progress towards OT goals this session. Pt seated on toilet upon arrival agreeable to OT intervention. Pt required MOD A +2 to power into standing from low toilet seat with stedy, however pt sit<>stands from seat of stedy with min guard and additionally able to stand from recliner with RW and MIN A +2 to rise, pt continues to be unable to take steps d/t 9/10 pain in BLEs. Pt continuing to decline SNF, pts wife present during session stating that pt at least needs to be able to pivot to w/c and [facility], will continue efforts next session to work on transfer training. Pt would continue to benefit from skilled occupational therapy while admitted and after d/c to address the below listed limitations in order to improve overall functional mobility and facilitate independence with BADL participation. DC plan remains appropriate, will follow acutely per POC.     Follow Up Recommendations  SNF    Equipment Recommendations  3 in 1 bedside commode;Wheelchair (measurements OT);Wheelchair cushion (measurements OT)    Recommendations for Other Services      Precautions / Restrictions Precautions Precautions: Fall Precaution Comments: CRRT neck access, PD catheter in LLQ Restrictions Weight  Bearing Restrictions: No       Mobility Bed Mobility               General bed mobility comments: pt recieved seated on toilet and pt returned to sitting in recliner    Transfers Overall transfer level: Needs assistance Equipment used: Rolling walker (2 wheeled) Transfers: Sit to/from Stand   Stand pivot transfers: Mod assist;Min assist;From elevated surface;+2 safety/equipment;+2 physical assistance       General transfer comment: pt requried MOD A +2 to rise from low toilet seat with use of stedy. MIN A +2 to rise from recliner with RW. pt contimues to be unable to pivot feet during transfers    Balance Overall balance assessment: Needs assistance Sitting-balance support: No upper extremity supported;Feet supported Sitting balance-Leahy Scale: Good     Standing balance support: Bilateral upper extremity supported;During functional activity Standing balance-Leahy Scale: Poor Standing balance comment: heavy reliance on RW                           ADL either performed or assessed with clinical judgement   ADL Overall ADL's : Needs assistance/impaired             Lower Body Bathing: Total assistance;Sitting/lateral leans Lower Body Bathing Details (indicate cue type and reason): simulated via LB Dresssing     Lower Body Dressing: Total assistance;Sitting/lateral leans Lower Body Dressing Details (indicate cue type and reason): to don socks from EOB Toilet Transfer: Total assistance;Moderate assistance;Minimal assistance;RW (stedy) Toilet Transfer Details (indicate  cue type and reason): pt on toilet upon arrival needing MOD A +2 to power up from low toilet seat and total A to tranfser off toilet via stedy. pt additionally able to sit<>stand from recliner with MIN A +2 with use of RW         Functional mobility during ADLs: Moderate assistance;Minimal assistance;Rolling walker (sit<>stand only) General ADL Comments: pt continues to present with  decreased activity tolerance, BLE weakness and generalized deconditioning     Vision       Perception     Praxis      Cognition Arousal/Alertness: Awake/alert Behavior During Therapy: WFL for tasks assessed/performed Overall Cognitive Status: Impaired/Different from baseline Area of Impairment: Safety/judgement                         Safety/Judgement: Decreased awareness of safety;Decreased awareness of deficits     General Comments: pt continues to present with impaired insight into deficits as pt continues to be unable to pivot feet but is declining SNF.        Exercises Other Exercises Other Exercises: encouraged BLEs and BUES therex from recliner to increase strength and activity tolerance for higher level functional mobility tasks   Shoulder Instructions       General Comments pt continues to decline SNF, extensive education on benefits of inpatient rehab vs [facility] services. wife present and reports that pt would at least need to be able to pivot to w/c and BSC.    Pertinent Vitals/ Pain       Pain Assessment: 0-10 Pain Score: 9  Pain Location: BLEs Pain Descriptors / Indicators: Grimacing;Guarding;Aching;Discomfort Pain Intervention(s): Monitored during session;Repositioned  Home Living                                          Prior Functioning/Environment              Frequency  Min 2X/week        Progress Toward Goals  OT Goals(current goals can now be found in the care plan section)  Progress towards OT goals: Progressing toward goals  Acute Rehab OT Goals Patient Stated Goal: home with [facility] OT Goal Formulation: With patient Time For Goal Achievement: 03/26/20 Potential to Achieve Goals: Good  Plan Discharge plan remains appropriate;Frequency remains appropriate    Co-evaluation                 AM-PAC OT "6 Clicks" Daily Activity     Outcome Measure   Help from another person eating meals?: None Help  from another person taking care of personal grooming?: A Little Help from another person toileting, which includes using toliet, bedpan, or urinal?: A Lot Help from another person bathing (including washing, rinsing, drying)?: A Lot Help from another person to put on and taking off regular upper body clothing?: A Little Help from another person to put on and taking off regular lower body clothing?: Total 6 Click Score: 15    End of Session Equipment Utilized During Treatment: Gait belt;Rolling walker;Other (comment) (stedy)  OT Visit Diagnosis: Unsteadiness on feet (R26.81);Other abnormalities of gait and mobility (R26.89);Muscle weakness (generalized) (M62.81);Pain Pain - Right/Left: Left (bilateral) Pain - part of body: Leg   Activity Tolerance Patient tolerated treatment well   Patient Left in chair;with call bell/phone within reach   Nurse Communication Mobility status;Other (comment) (stedy  for back to bed)        Time: [id number] OT Time Calculation (min): 20 min  Charges: OT General Charges $OT Visit: 1 Visit OT Treatments $Self Care/Home Management : 8-22 mins  Harley Alto., COTA/L Acute Rehabilitation Services [phone]   Precious Haws 03/19/2020, 2:41 PM

## 2020-03-19 NOTE — Progress Notes (Signed)
IR requested by Dr. Jonnie Finner for possible image-guided tunneled HD catheter placement. Please see my consult note from 03/18/2020 for more information regarding work-up.  Labs reviewed this AM- WBCs up to 20.1 (from 14.8 yesterday). Discussed with Dr. Cruzita Lederer [facility])- at this time, patient is asymptomatic from leukocytosis (including afebrile); he is not on steroids (unsure of cause of leukocytosis). Discussed case with Dr. Pascal Lux (IR). Since patient has a functioning non-tunneled HD catheter, recommend procedure on HOLD until leukocytosis cause is identified/treated. Will make Dr. Cruzita Lederer aware. Will restart diet- patient will be NPO at midnight in anticipation for possible procedure 03/20/2020 pending above.  Please call IR with questions/concerns.   Bea Graff Haden Suder, PA-C 03/19/2020, 10:32 AM

## 2020-03-19 NOTE — Progress Notes (Signed)
Physical Therapy Treatment Patient Details Name: Max Pittman. MRN: [id number] DOB: 19-Apr-1975 Today's Date: 03/19/2020    History of Present Illness Max Gunnell. is a 45 y.o. male who during early AM hours 2/10,  had worsening dyspnea at home.  Had apparently been experiencing dyspnea and trying to manage it with home peritoneal dialysis.  EMS was called and he initially refused transport to ED; however, after their arrival, he had a cardiac arrest.  Initial rhythm was asystole and he required 12 minutes before ROSC. Pt intubated 2/10, self extubated at 2/14 at midnight.  Also on CRRT.  PMH: including but not limited to ESRD on PD, DM, NSTEMI, HTN, DVT on warfarin, OSA.    PT Comments    Pt had fallen on floor and RN staff requested help with getting him back to bed. Staff x4 had been attempting to lift him from the floor. Educated staff as to use of Maximove for increased safety for both pt and staff. Pt was able to weight shift L and R for lift pad placement under his bottom. Continue to recommend SNF as pt unable to perform transfer on own and has poor safety awareness to attempt in the first place.    Follow Up Recommendations  SNF (if pt refuses SNF, would benefit from HHPT.)     Equipment Recommendations  Rolling walker with 5" wheels;3in1 (PT)       Precautions / Restrictions Precautions Precautions: Fall Precaution Comments: CRRT neck access, PD catheter in LLQ Restrictions Weight Bearing Restrictions: No    Mobility  Bed Mobility               General bed mobility comments: pt recieved seated on toilet and pt returned to sitting in recliner    Transfers Overall transfer level: Needs assistance Equipment used: Rolling walker (2 wheeled) Transfers: Sit to/from Stand   Stand pivot transfers: Mod assist;Min assist;From elevated surface;+2 safety/equipment;+2 physical assistance       General transfer comment: assisted RN staff with lift pad  placement to return pt to bed after fall onto floor         Balance Overall balance assessment: Needs assistance Sitting-balance support: No upper extremity supported;Feet supported Sitting balance-Leahy Scale: Good     Standing balance support: Bilateral upper extremity supported;During functional activity Standing balance-Leahy Scale: Poor Standing balance comment: heavy reliance on RW                            Cognition Arousal/Alertness: Awake/alert Behavior During Therapy: WFL for tasks assessed/performed Overall Cognitive Status: Within Functional Limits for tasks assessed Area of Impairment: Safety/judgement;Memory;Following commands;Awareness;Problem solving                     Memory: Decreased short-term memory   Safety/Judgement: Decreased awareness of deficits   Problem Solving: Requires verbal cues General Comments: pt thought it would be okay to try to get back to bed when his legs were sore, fell forward onto his knees on floor      Exercises Other Exercises Other Exercises: encouraged BLEs and BUES therex from recliner to increase strength and activity tolerance for higher level functional mobility tasks    General Comments General comments (skin integrity, edema, etc.): pt continues to decline SNF, extensive education on benefits of inpatient rehab vs [facility] services. wife present and reports that pt would at least need to be able to pivot to w/c and  BSC.      Pertinent Vitals/Pain Pain Assessment: Faces Pain Score: 9  Faces Pain Scale: Hurts little more Pain Location: bilateral knees Pain Descriptors / Indicators: Grimacing;Guarding;Aching Pain Intervention(s): Monitored during session;Repositioned           PT Goals (current goals can now be found in the care plan section) Acute Rehab PT Goals Patient Stated Goal: I'd like to go home and have Max Pittman come in. PT Goal Formulation: With patient Time For Goal Achievement:  03/25/20 Potential to Achieve Goals: Fair    Frequency    Min 3X/week      PT Plan Current plan remains appropriate       AM-PAC PT "6 Clicks" Mobility   Outcome Measure  Help needed turning from your back to your side while in a flat bed without using bedrails?: A Little Help needed moving from lying on your back to sitting on the side of a flat bed without using bedrails?: A Little Help needed moving to and from a bed to a chair (including a wheelchair)?: A Little Help needed standing up from a chair using your arms (e.g., wheelchair or bedside chair)?: A Little Help needed to walk in hospital room?: A Lot Help needed climbing 3-5 steps with a railing? : Total 6 Click Score: 15    End of Session   Activity Tolerance: Patient limited by pain Patient left: with call bell/phone within reach;in bed;with bed alarm set Nurse Communication: Mobility status;Need for lift equipment;Patient requests pain meds PT Visit Diagnosis: Muscle weakness (generalized) (M62.81)     Time: [id number] PT Time Calculation (min) (ACUTE ONLY): 11 min  Charges:  $Therapeutic Activity: 8-22 mins                     Max Pittman PT, DPT Acute Rehabilitation Services Pager [phone] Office [phone]    Pittman 03/19/2020, 4:04 PM

## 2020-03-19 NOTE — Progress Notes (Signed)
Patient ID: Max Pittman., male   DOB: 1975-02-04, 45 y.o.   MRN: [id number] [facility] Progress Note   Assessment/ Plan:   1.  Acute hypoxic respiratory failure: Secondary to' \\volume'$  overload/pulmonary edema resulting in asystolic cardiac arrest.  Now is on nasal cannula O2.  Lungs clear on exam and no edema, down to 124kg (159 on admit).  2. ESRD: He had anasarca/volume overload on admission raising concerns for modality failure versus nonadherence with peritoneal dialysis.  Was on CRRT for 6-7 days with net negative fluid balance of 31 L. Wt's dropped from 158 to 122 kg w/ CRRT. Pt is failing PD, have d/w pt's primary neph MD, will plan for transition to hemodialysis. IR consulting for [facility], wait until sure pt not infected, see their notes. Have asked Gen Surg to evaluate for removal of PD cath while coumadin is on hold. Will plan PD for tonight.  3. Anemia: Without overt blood loss, low hemoglobin/hematocrit noted, continue ESA. 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 5. Nutrition: On renal diet without any evidence of aspiration. 6. Hypertension: Blood pressures intermittently elevated, monitor with peritoneal dialysis/ultrafiltration. 7. 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  8. ^WBC - blood cx's ordered, no abd pain / tenderness. Will have temp HD cath removed today as this is possible infection source, use PD for now for solute control.    [name], MD 03/19/2020, 11:40 AM    Subjective:   Seen in room, no c/o today. No cough or sob   Objective:   BP (!) 156/97 (BP Location: Right Arm)   Pulse (!) 101   Temp 98.5 F (36.9 C) (Oral)   Resp 19   Ht '5\' 9"'$  (1.753 m)   Wt 125 kg   SpO2 93%   BMI 40.70 kg/m   Physical Exam: Gen: Resting comfortably in bed CVS: Pulse regular rhythm, normal rate, S1 and S2 normal.   Resp:  no rales  bilaterally Abd: Soft, obese, nontender, PD catheter in situ Ext: no LE or UE edema  Thrombosed LUA AVF   R IJ temp HD cath    Labs: BMET Recent Labs  Lab 03/13/20 0351 03/14/20 0233 03/15/20 0605 03/16/20 0210 03/17/20 0217 03/18/20 0128 03/19/20 0633  NA 135 135 137 137 136 135 132*  K 4.3 4.0 4.0 3.9 4.3 3.5 4.1  CL 100 101 98 101 99 96* 96*  CO2 '25 24 24 22 22 23 23  '$ GLUCOSE 138* 121* 156* 249* 250* 257* 154*  BUN 9 25* 34* 38* 39* 46* 56*  CREATININE 1.83* 3.84* 5.58* 6.37* 7.26* 7.91* 8.69*  CALCIUM 9.3 9.4 10.3 9.7 9.9 9.5 9.4  PHOS 2.2* 4.6 4.3 4.2 5.1* 5.4* 5.6*   CBC Recent Labs  Lab 03/13/20 0351 03/14/20 0233 03/15/20 0605 03/16/20 0210 03/17/20 0210 03/18/20 0128 03/19/20 0633  WBC 8.7 12.2*   < > 12.7* 14.4* 14.8* 20.1*  NEUTROABS 4.2 5.6  --   --   --   --   --   HGB 9.1* 8.0*   < > 9.2* 9.5* 8.7* 8.8*  HCT 28.9* 27.0*   < > 30.1* 30.5* 27.0* 27.3*  MCV 90.0 92.5   < > 91.5 90.8 90.3 88.3  PLT 187 196   < > 279 348 373 446*   < > = values in this interval not displayed.  Medications:    . (feeding supplement) PROSource Plus  30 mL Oral BID WC  . amLODipine  5 mg Oral Daily  . aspirin  81 mg Oral Daily  . atorvastatin  40 mg Oral q1800  . buPROPion  150 mg Oral BID WC  . cephALEXin  250 mg Oral Q12H  . Chlorhexidine Gluconate Cloth  6 each Topical Daily  . docusate sodium  100 mg Oral BID  . feeding supplement  237 mL Oral BID BM  . gentamicin cream  1 application Topical Daily  . insulin aspart  0-20 Units Subcutaneous Q4H  . lamoTRIgine  100 mg Oral BID  . levETIRAcetam  500 mg Oral Daily  . mouth rinse  15 mL Mouth Rinse BID  . melatonin  5 mg Oral QHS  . metoprolol tartrate  12.5 mg Oral BID  . multivitamin  1 tablet Oral QHS  . pantoprazole  40 mg Oral Daily  . polyethylene glycol  17 g Oral Daily  . sodium chloride flush  10-40 mL Intracatheter Q12H

## 2020-03-19 NOTE — Progress Notes (Addendum)
Nutrition Follow-up  DOCUMENTATION CODES:   Obesity unspecified  INTERVENTION:   -Liberalize diet to low-sodium diet to promote po intake; monitor need for appropriate diet restrictions  -Continue rena-vite po daily  -Continue Ensure Enlive po BID, each supplement provides 350 kcal, 20 grams of protein   -Continue PROsource Plus po BID, each supplement provides 100 kcals, 15 grams protein   NUTRITION DIAGNOSIS:   Inadequate oral intake related to poor appetite as evidenced by per patient/family report.  GOAL:   Patient will meet greater than or equal to 90% of their needs  MONITOR:   PO intake,Skin,Supplement acceptance,I & O's,Labs,Weight trends,Diet advancement  REASON FOR ASSESSMENT:   Ventilator,Consult Enteral/tube feeding initiation and management  ASSESSMENT:   45 yo male admitted post cardiac arrest with acute respiratory failure secondary to acute pulmonary edema requiring intubation PMH includes ESRD on PD, DM, HTN, OSA  Pt's diet history indicates that pt was consuming 100% of his meals on 02/19, however has been frequently on NPO diet. Pt discussed with intern that he has not been hungry the past few days and hasn't wanted to eat much if anything. Pt discussed with intern that he has been enjoying his supplements, PROsource and Ensure Enlive and wanted to continue having these. Intern reiterated the importance of consuming adequate protein and calories to aid in his healing process and to prevent loss of lean muscle mass due to dialysis (PD). Pt denied any chewing or swallowing difficulties. Pt denied any nausea, vomiting, constipation or abdominal pain. Pt did mention that he has been having diarrhea for the past few days.   Pt's weights reviewed and show no significant changes. Pt discussed with intern that he felt he has had weight fluctuations recently, but mentioned that he felt this was due to fluid accumulation.  Admit Weight: 158 kg  Current Weight: 125  kg  I&O's Reviewed:   Meds reviewed: ProSource Plus BID, Colace BID, Ensure Enlive BID, Miralax daily, Dialysis Solution, Rena-Vit daily  Labs reviewed: Sodium (low), Phosphorus (high)  NUTRITION - FOCUSED PHYSICAL EXAM:  Flowsheet Row Most Recent Value  Orbital Region No depletion  Upper Arm Region No depletion  Thoracic and Lumbar Region No depletion  Buccal Region No depletion  Temple Region No depletion  Clavicle Bone Region No depletion  Clavicle and Acromion Bone Region No depletion  Scapular Bone Region No depletion  Dorsal Hand No depletion  Patellar Region No depletion  Anterior Thigh Region No depletion  Posterior Calf Region No depletion  Edema (RD Assessment) Mild  [bilateral lower and upper extremities]  Hair Reviewed  Eyes Reviewed  Mouth Reviewed  Skin Reviewed  Nails Reviewed       Diet Order:   Diet Order            Diet NPO time specified Except for: Sips with Meds  Diet effective midnight           Diet renal with fluid restriction Fluid restriction: 1200 mL Fluid; Room service appropriate? Yes; Fluid consistency: Thin  Diet effective now                 EDUCATION NEEDS:   No education needs have been identified at this time  Skin:  Skin Assessment: Reviewed RN Assessment  Last BM:  02/21  Height:   Ht Readings from Last 1 Encounters:  03/07/20 '5\' 9"'$  (1.753 m)    Weight:   Wt Readings from Last 1 Encounters:  03/19/20 125 kg    Ideal  Body Weight:  72.7 kg  BMI:  Body mass index is 40.7 kg/m.  Estimated Nutritional Needs:   Kcal:  2200-2400 kcals  Protein:  110-140 g  Fluid:  1.8 L    Salvadore Oxford, Dietetic Intern 03/19/2020 3:34 PM

## 2020-03-19 NOTE — Progress Notes (Signed)
[place] for IV heparin>>warfarin Indication: Hx PE, Chronic DVT  Allergies  Allergen Reactions  . Doxycycline Hives  . Latex Swelling    Pt reports swelling at site.   . Morphine And Related Anxiety    Patient Measurements: Height: '5\' 9"'$  (175.3 cm) Weight: 125 kg (275 lb 9.6 oz) IBW/kg (Calculated) : 70.7 Heparin Dosing Weight: 95 kg  Vital Signs: Temp: 98.7 F (37.1 C) (02/22 0300) Temp Source: Oral (02/22 0300) BP: 142/84 (02/22 0300) Pulse Rate: 100 (02/22 0300)  Labs: Recent Labs    03/17/20 0210 03/17/20 0217 03/17/20 1116 03/18/20 0128 03/18/20 1115 03/18/20 2059 03/19/20 0633  HGB 9.5*  --   --  8.7*  --   --  8.8*  HCT 30.5*  --   --  27.0*  --   --  27.3*  PLT 348  --   --  373  --   --  446*  LABPROT 16.1*  --   --  16.3*  --   --  15.6*  INR 1.3*  --   --  1.4*  --   --  1.3*  HEPARINUNFRC  --  1.02*   < > 0.12* 0.20* <0.10* 0.44  CREATININE  --  7.26*  --  7.91*  --   --  8.69*   < > = values in this interval not displayed.    Estimated Creatinine Clearance: 14.3 mL/min (A) (by C-G formula based on SCr of 8.69 mg/dL (H)).   Assessment: 21 yoM on warfarin PTA for hx PE and chronic DVTs admitted s/p cardiac arrest. Warfarin held, heparin started 2/12 with INR <2.  Heparin level now at goal (0.44) on gtt at 2500 units/hr this morning.  No issues with line or bleeding reported. No warfarin given last night with IR now planning HD cath today 2/22, will continue to hold warfarin until plan is more clear and no further procedures are planned.  Goal of Therapy:  INR goal 2-3 Heparin level 0.3-0.7 units/ml Monitor platelets by anticoagulation protocol: Yes   Plan:  Increase IV heparin to 2500 units/hr  Daily heparin level and cbc  Erin Hearing PharmD., BCPS Clinical Pharmacist 03/19/2020 7:51 AM

## 2020-03-19 NOTE — Care Management Important Message (Signed)
Important Message  Patient Details  Name: Max Pittman. MRN: [id number] Date of Birth: 1975-10-03   Medicare Important Message Given:  Yes     Shelda Altes 03/19/2020, 9:30 AM

## 2020-03-19 NOTE — Progress Notes (Signed)
ANTICOAGULATION CONSULT NOTE - Follow Up Consult  Pharmacy Consult for heparin Indication: h/o VTE  Labs: Recent Labs    03/17/20 0210 03/17/20 0217 03/17/20 1116 03/18/20 0128 03/18/20 1115 03/18/20 2059 03/19/20 0633  HGB 9.5*  --   --  8.7*  --   --  8.8*  HCT 30.5*  --   --  27.0*  --   --  27.3*  PLT 348  --   --  373  --   --  446*  LABPROT 16.1*  --   --  16.3*  --   --  15.6*  INR 1.3*  --   --  1.4*  --   --  1.3*  HEPARINUNFRC  --  1.02*   < > 0.12* 0.20* <0.10* 0.44  CREATININE  --  7.26*  --  7.91*  --   --   --    < > = values in this interval not displayed.    Assessment/Plan:  45yo male therapeutic on heparin. Will continue gtt at current rate of 2500 units/hr and confirm stable with additional level.   Wynona Neat, PharmD, BCPS  03/19/2020,7:30 AM

## 2020-03-19 NOTE — Progress Notes (Signed)
Pt had an accidental fall and was found sitting up on the floor. No injuries reported by the patient, nor did he hit his head on anything. Pt assisted back to the bed by staff via [place]. Wife states she was trying to assist him to get up from the recliner w/ the walker and the patients legs "gave out" and he fell slowly to the floor. Pt back to bed, vitals stable, bed low, wheels locked, alarms on, and call bell within reach. MD Ghereghe notifed and came to visit the patient.

## 2020-03-20 ENCOUNTER — Encounter (HOSPITAL_COMMUNITY): Payer: Self-pay | Admitting: Internal Medicine

## 2020-03-20 ENCOUNTER — Inpatient Hospital Stay (HOSPITAL_COMMUNITY): Payer: Medicare Other

## 2020-03-20 DIAGNOSIS — D72829 Elevated white blood cell count, unspecified: Secondary | ICD-10-CM

## 2020-03-20 DIAGNOSIS — J9601 Acute respiratory failure with hypoxia: Secondary | ICD-10-CM | POA: Diagnosis not present

## 2020-03-20 DIAGNOSIS — N186 End stage renal disease: Secondary | ICD-10-CM | POA: Diagnosis not present

## 2020-03-20 DIAGNOSIS — Z7901 Long term (current) use of anticoagulants: Secondary | ICD-10-CM | POA: Diagnosis not present

## 2020-03-20 DIAGNOSIS — G9340 Encephalopathy, unspecified: Secondary | ICD-10-CM | POA: Diagnosis not present

## 2020-03-20 HISTORY — PX: IR US GUIDE VASC ACCESS RIGHT: IMG2390

## 2020-03-20 HISTORY — PX: IR PERC TUN PERIT CATH WO PORT S&I /IMAG: IMG2327

## 2020-03-20 LAB — BODY FLUID CELL COUNT WITH DIFFERENTIAL: Total Nucleated Cell Count, Fluid: 6 cu mm (ref 0–1000)

## 2020-03-20 LAB — CBC
HCT: 24.8 % — ABNORMAL LOW (ref 39.0–52.0)
Hemoglobin: 8 g/dL — ABNORMAL LOW (ref 13.0–17.0)
MCH: 28.6 pg (ref 26.0–34.0)
MCHC: 32.3 g/dL (ref 30.0–36.0)
MCV: 88.6 fL (ref 80.0–100.0)
Platelets: 442 10*3/uL — ABNORMAL HIGH (ref 150–400)
RBC: 2.8 MIL/uL — ABNORMAL LOW (ref 4.22–5.81)
RDW: 17.4 % — ABNORMAL HIGH (ref 11.5–15.5)
WBC: 16.6 10*3/uL — ABNORMAL HIGH (ref 4.0–10.5)
nRBC: 0.1 % (ref 0.0–0.2)

## 2020-03-20 LAB — PROTIME-INR
INR: 1.3 — ABNORMAL HIGH (ref 0.8–1.2)
Prothrombin Time: 15.6 seconds — ABNORMAL HIGH (ref 11.4–15.2)

## 2020-03-20 LAB — COMPREHENSIVE METABOLIC PANEL
ALT: 15 U/L (ref 0–44)
AST: 40 U/L (ref 15–41)
Albumin: 2.2 g/dL — ABNORMAL LOW (ref 3.5–5.0)
Alkaline Phosphatase: 76 U/L (ref 38–126)
Anion gap: 15 (ref 5–15)
BUN: 59 mg/dL — ABNORMAL HIGH (ref 6–20)
CO2: 22 mmol/L (ref 22–32)
Calcium: 9.1 mg/dL (ref 8.9–10.3)
Chloride: 96 mmol/L — ABNORMAL LOW (ref 98–111)
Creatinine, Ser: 9.24 mg/dL — ABNORMAL HIGH (ref 0.61–1.24)
GFR, Estimated: 7 mL/min — ABNORMAL LOW (ref >60)
Glucose, Bld: 156 mg/dL — ABNORMAL HIGH (ref 70–99)
Potassium: 3.7 mmol/L (ref 3.5–5.1)
Sodium: 133 mmol/L — ABNORMAL LOW (ref 135–145)
Total Bilirubin: 0.5 mg/dL (ref 0.3–1.2)
Total Protein: 6.7 g/dL (ref 6.5–8.1)

## 2020-03-20 LAB — GLUCOSE, CAPILLARY
Glucose-Capillary: 150 mg/dL — ABNORMAL HIGH (ref 70–99)
Glucose-Capillary: 102 mg/dL — ABNORMAL HIGH (ref 70–99)
Glucose-Capillary: 105 mg/dL — ABNORMAL HIGH (ref 70–99)
Glucose-Capillary: 122 mg/dL — ABNORMAL HIGH (ref 70–99)
Glucose-Capillary: 140 mg/dL — ABNORMAL HIGH (ref 70–99)
Glucose-Capillary: 90 mg/dL (ref 70–99)

## 2020-03-20 LAB — HEPARIN LEVEL (UNFRACTIONATED): Heparin Unfractionated: 0.27 IU/mL — ABNORMAL LOW (ref 0.30–0.70)

## 2020-03-20 IMAGING — XA IR TUNNELED CV CATH W/O PORT/PUMP >5YO
3 series · 3 of 3 positions shown · non-contrast
Comparison: none

INDICATION: End-stage renal disease

[Series 1: fl (-) angio · 1 of 1 slices shown (1 of 2)]
[im 1/1]
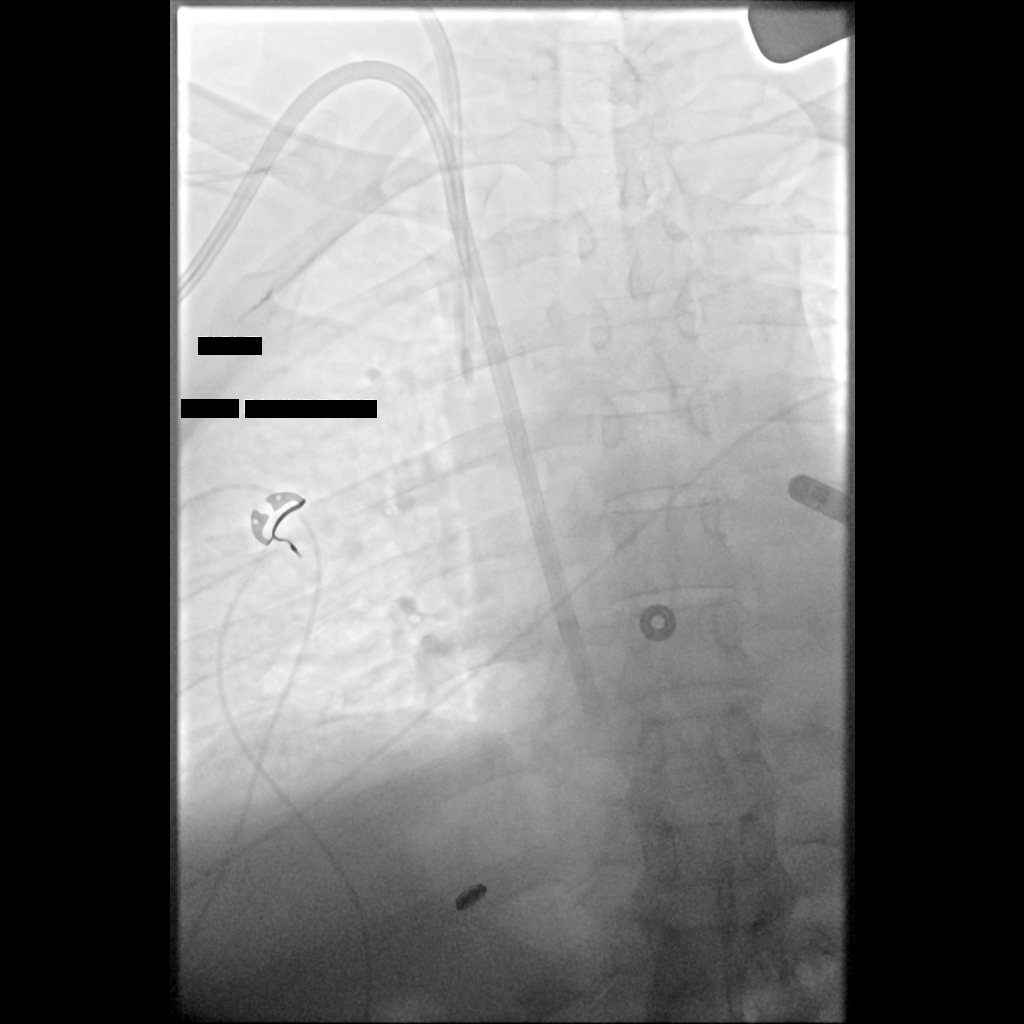

[Series 1: ir tunneled cv cath w/o port/pump >5yo · 1 of 1 slices shown]
[im 1/1]
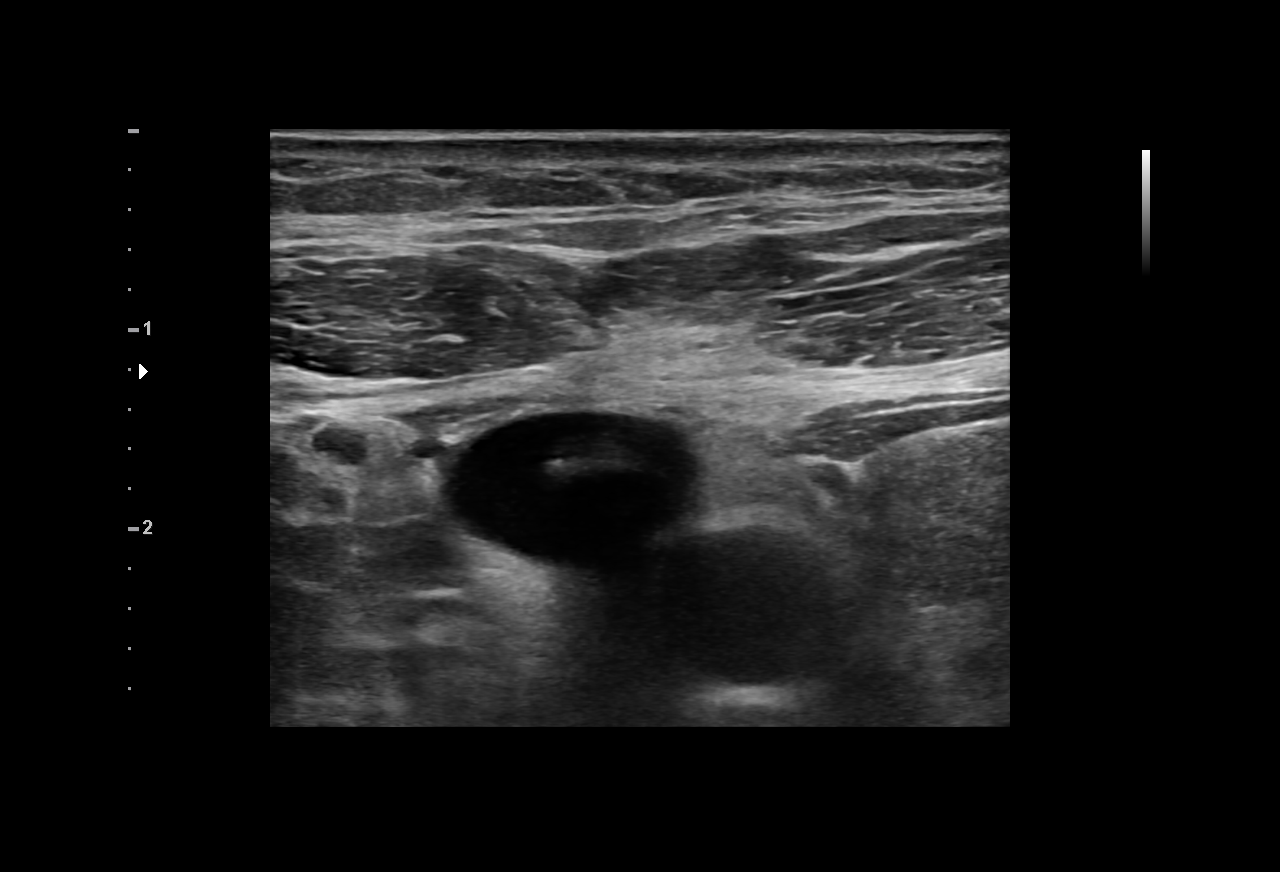

[Series 2: fl (-) angio · 1 of 1 slices shown (2 of 2)]
[im 1/1]
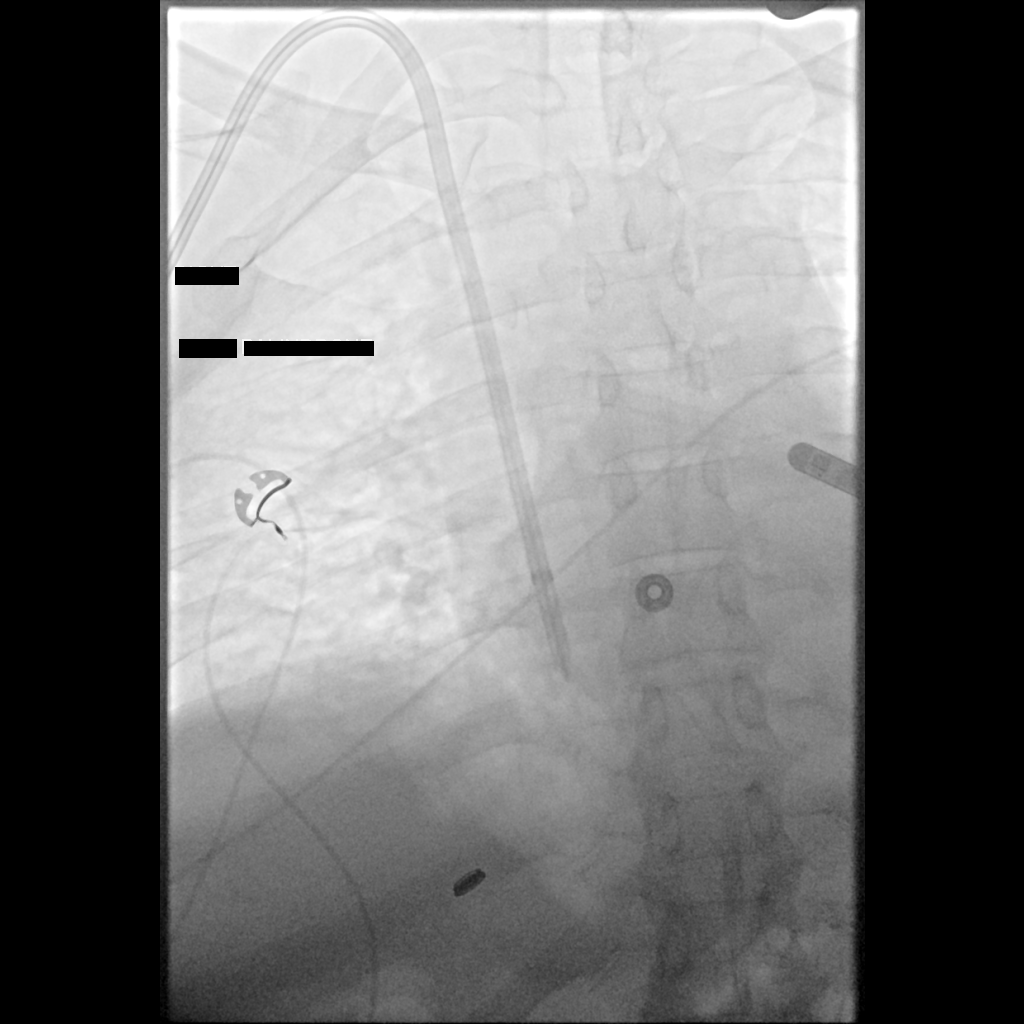

[3 of 3 positions shown; findings below may reference images not displayed]

EXAM:
TUNNELED CENTRAL VENOUS HEMODIALYSIS CATHETER PLACEMENT WITH
ULTRASOUND AND FLUOROSCOPIC GUIDANCE

MEDICATIONS:
Ancef 2 g IV. The antibiotic was given in an appropriate time
interval prior to skin puncture.

ANESTHESIA/SEDATION:
Moderate (conscious) sedation was employed during this procedure. A
total of Versed 2 mg and Fentanyl 100 mcg was administered
intravenously.

Moderate Sedation Time: 10 minutes. The patient's level of
consciousness and vital signs were monitored continuously by
radiology nursing throughout the procedure under my direct
supervision.

FLUOROSCOPY TIME:  Fluoroscopy Time: 0 minutes 48 seconds (8 mGy).

COMPLICATIONS:
None immediate.



The right internal jugular vein was evaluated with ultrasound and
shown to be patent. A permanent ultrasound image was obtained and
placed in the patient's medical record. Using sterile gel and a
sterile probe cover, the right internal jugular vein was entered
with a 21 ga needle during real time ultrasound guidance.

0.018 inch guidewire placed and 21 ga needle exchanged for
transitional dilator set. Utilizing fluoroscopy, 0.035 inch
guidewire advanced through the needle without difficulty.

Seriel dilation was performed and peel-away sheath was placed.
Attention then turned to the right anterior upper chest. Following
local lidocaine administration, the hemodialysis catheter was
tunneled from the chest wall to the venotomy site. The catheter was
inserted through the peel-away sheath. The tip of the catheter was
positioned within the right atrium using fluoroscopic guidance.

All lumens of the catheter aspirated and flushed well. The dialysis
lumens were locked with Heparin.

The catheter was secured to the skin with suture. The insertion site
was covered with a Biopatch and sterile dressing.

Temporary right non tunneled hemodialysis catheter was removed
without difficulty and hemostasis achieved with manual compression.
IMPRESSION: Successful placement of 23 cm tip to cuff tunneled hemodialysis
catheter via the right internal jugular vein with tips terminating
within the superior aspect of the right atrium. The catheter is
ready for immediate use.

## 2020-03-20 MED ORDER — LIDOCAINE HCL 1 % IJ SOLN
INTRAMUSCULAR | Status: AC | PRN
  Administered 2020-03-20: 20 mL via INTRADERMAL

## 2020-03-20 MED ORDER — FENTANYL CITRATE (PF) 100 MCG/2ML IJ SOLN
INTRAMUSCULAR | Status: AC | PRN
  Administered 2020-03-20 (×2): 50 ug via INTRAVENOUS

## 2020-03-20 MED ORDER — CEFAZOLIN SODIUM-DEXTROSE 2-4 GM/100ML-% IV SOLN
2.0000 g | Freq: Once | INTRAVENOUS | Status: AC
  Filled 2020-03-20: qty 100

## 2020-03-20 MED ORDER — MIDAZOLAM HCL 2 MG/2ML IJ SOLN
INTRAMUSCULAR | Status: AC
  Filled 2020-03-20: qty 2

## 2020-03-20 MED ORDER — HEPARIN SODIUM (PORCINE) 1000 UNIT/ML IJ SOLN
INTRAMUSCULAR | Status: AC
  Filled 2020-03-20: qty 1

## 2020-03-20 MED ORDER — FENTANYL CITRATE (PF) 100 MCG/2ML IJ SOLN
INTRAMUSCULAR | Status: AC
  Filled 2020-03-20: qty 2

## 2020-03-20 MED ORDER — MIDAZOLAM HCL 2 MG/2ML IJ SOLN
INTRAMUSCULAR | Status: AC | PRN
  Administered 2020-03-20 (×2): 1 mg via INTRAVENOUS

## 2020-03-20 MED ORDER — CEFAZOLIN SODIUM-DEXTROSE 2-4 GM/100ML-% IV SOLN
INTRAVENOUS | Status: AC
  Administered 2020-03-20: 2 g via INTRAVENOUS
  Filled 2020-03-20: qty 100

## 2020-03-20 MED ORDER — LIDOCAINE HCL 1 % IJ SOLN
INTRAMUSCULAR | Status: AC
  Filled 2020-03-20: qty 20

## 2020-03-20 MED ORDER — HEPARIN SODIUM (PORCINE) 1000 UNIT/ML IJ SOLN
INTRAMUSCULAR | Status: AC | PRN
  Administered 2020-03-20: 1000 [IU]

## 2020-03-20 MED ORDER — CEFAZOLIN SODIUM-DEXTROSE 2-4 GM/100ML-% IV SOLN
2.0000 g | INTRAVENOUS | Status: DC
  Filled 2020-03-20: qty 100

## 2020-03-20 MED ORDER — DELFLEX-LC/1.5% DEXTROSE 344 MOSM/L IP SOLN: INTRAPERITONEAL | Status: DC

## 2020-03-20 MED ORDER — CHLORHEXIDINE GLUCONATE 4 % EX LIQD
CUTANEOUS | Status: AC
  Filled 2020-03-20: qty 15

## 2020-03-20 MED ORDER — HEPARIN (PORCINE) 25000 UT/250ML-% IV SOLN
2600.0000 [IU]/h | INTRAVENOUS | Status: AC
  Administered 2020-03-20 (×2): 2600 [IU]/h via INTRAVENOUS
  Filled 2020-03-20: qty 250

## 2020-03-20 MED ORDER — DELFLEX-LC/2.5% DEXTROSE 394 MOSM/L IP SOLN: INTRAPERITONEAL | Status: DC

## 2020-03-20 NOTE — Consult Note (Signed)
Max Pittman. [birth year]  [id number].    Requesting MD: Dr. Roney Jaffe Chief Complaint/Reason for Consult: removal of PD cath  HPI:  This is a 45 yo male with a significant past medical history including DM, DVT, CVA, MIs, HTN, ESRD on HD, on coumadin (which is currently on hold), CHF who presented with a CHF exacerbation and arrested secondary to volume overload.  He underwent 12 minutes of CPR with ROSC at that time.  He has since been diuresed and has lost over 30kg of weight since admission.  He currently needs to be transitioned to HD from PD.  He has his PD catheter placed in [place] several years ago.  His coumadin has been held and his INR is 1.3.  Because he is optimized and his INR subtherapeutic, we have been asked to see him while here to removed his PD catheter.  He does have HD cath access.  ROS: ROS: Please see HPI, otherwise currently patient's other systems are negative.  Family History  Problem Relation Age of Onset  . Heart disease Mother   . Diabetes Mother   . Hypertension Mother   . Diabetes Father   . Hypertension Father   . CAD Mother        "angina"  . Hypercholesterolemia Mother   . Hypercholesterolemia Father   . Healthy Brother     Past Medical History:  Diagnosis Date  . Arthritis   . Closed dislocation of left hip ([place]) 05/21/2019  . Diabetes ([place])   . Diabetes mellitus without complication ([place])   . DVT (deep venous thrombosis) ([place])   . History of peritoneal dialysis   . Hypertension   . Renal disorder    FFGS  . Renal disorder   . Sleep apnea   . Stroke [facility])    when ha was a child  . Vasovagal syndrome    with syncope    Past Surgical History:  Procedure Laterality Date  . AMPUTATION Right 07/21/2019   Procedure: Right index finger amputation as necessary at distal interphalangeal joint;  Surgeon: Roseanne Kaufman, MD;  Location: [place];  Service: Orthopedics;  Laterality: Right;  60 mins  . APPLICATION OF WOUND VAC  Left 05/22/2019   Procedure: APPLICATION OF WOUND VAC  LEFT HIP;  Surgeon: Altamese Coachella, MD;  Location: [place];  Service: Orthopedics;  Laterality: Left;  . APPLICATION OF WOUND VAC Left 07/17/2019   Procedure: WOUND VAC CHANGE;  Surgeon: Altamese [place] Iqua, MD;  Location: [place];  Service: Orthopedics;  Laterality: Left;  . AV FISTULA INSERTION W/ RF MAGNETIC GUIDANCE Left   . AV FISTULA PLACEMENT    . BUBBLE STUDY  05/25/2019   Procedure: BUBBLE STUDY;  Surgeon: Dorothy Spark, MD;  Location: [place];  Service: Cardiovascular;;  . CHOLECYSTECTOMY    . HERNIA REPAIR    . HIP CLOSED REDUCTION Left 05/20/2019   Procedure: CLOSED REDUCTION HIP WITH TRACTION PIN APPLICATION;  Surgeon: Altamese Hillsboro, MD;  Location: [place];  Service: Orthopedics;  Laterality: Left;  . INCISION AND DRAINAGE HIP Left 07/14/2019   Procedure: IRRIGATION AND DEBRIDEMENT HIP;  Surgeon: Altamese Crozier, MD;  Location: [place];  Service: Orthopedics;  Laterality: Left;  . INCISION AND DRAINAGE HIP Left 07/17/2019   Procedure: REPEAT IRRIGATION AND DEBRIDEMENT LEFT HIP;  Surgeon: Altamese [place] Beach, MD;  Location: [place];  Service: Orthopedics;  Laterality: Left;  . IR FLUORO GUIDE CV LINE RIGHT  05/23/2019  . IR FLUORO GUIDE  CV LINE RIGHT  07/21/2019  . IR REMOVAL TUN CV CATH W/O FL  08/29/2019  . IR US GUIDE VASC ACCESS RIGHT  05/23/2019  . IR US GUIDE VASC ACCESS RIGHT  07/21/2019  . LEFT HEART CATH AND CORONARY ANGIOGRAPHY N/A 03/22/2019   Procedure: LEFT HEART CATH AND CORONARY ANGIOGRAPHY;  Surgeon: Wellington Hampshire, MD;  Location: [place] CV LAB;  Service: Cardiovascular;  Laterality: N/A;  . LOOP RECORDER INSERTION N/A 05/25/2019   Procedure: LOOP RECORDER INSERTION;  Surgeon: [name], MD;  Location: [place] CV [facility];  Service: Cardiovascular;  Laterality: N/A;  . ORIF ACETABULAR FRACTURE Left 05/22/2019   Procedure: OPEN REDUCTION INTERNAL FIXATION (ORIF) T TYPE  WITH ASSOCIATED POSTERIOR WALL ACETABULAR FRACTURE,  LEFT; REMOVAL OF TRACTION PIN LEFT TIBIA;  Surgeon: Altamese [place], MD;  Location: [place];  Service: Orthopedics;  Laterality: Left;  . REMOVAL OF A DIALYSIS CATHETER Right 07/17/2019   Procedure: REMOVAL OF HEMODIALYSIS DIALYSIS CATHETER;  Surgeon: Altamese Raymore, MD;  Location: [place];  Service: Orthopedics;  Laterality: Right;  . TEE WITHOUT CARDIOVERSION N/A 05/25/2019   Procedure: TRANSESOPHAGEAL ECHOCARDIOGRAM (TEE);  Surgeon: Dorothy Spark, MD;  Location: [facility] ENDOSCOPY;  Service: Cardiovascular;  Laterality: N/A;  . TEE WITHOUT CARDIOVERSION N/A 07/19/2019   Procedure: TRANSESOPHAGEAL ECHOCARDIOGRAM (TEE);  Surgeon: Sueanne Margarita, MD;  Location: [facility];  Service: Cardiovascular;  Laterality: N/A;  . TONSILLECTOMY      Social History:  reports that he quit smoking about 16 months ago. He has never used smokeless tobacco. He reports current alcohol use. He reports that he does not use drugs.  Allergies:  Allergies  Allergen Reactions  . Doxycycline Hives  . Latex Swelling    Pt reports swelling at site.   . Morphine And Related Anxiety    Medications Prior to Admission  Medication Sig Dispense Refill  . acetaminophen (TYLENOL) 325 MG tablet Take 2 tablets (650 mg total) by mouth every 8 (eight) hours. (Patient taking differently: Take 650 mg by mouth every 6 (six) hours as needed for mild pain.)    . amLODipine (NORVASC) 5 MG tablet Take 1 tablet (5 mg total) by mouth daily. 30 tablet 0  . aspirin EC 81 MG EC tablet Take 1 tablet (81 mg total) by mouth daily.    [place] atorvastatin (LIPITOR) 40 MG tablet Take 1 tablet (40 mg total) by mouth daily at 6 PM. 31 tablet 6  . baclofen (LIORESAL) 10 MG tablet Take 10 mg by mouth 3 (three) times daily.    Marland [place] buPROPion (WELLBUTRIN SR) 150 MG 12 hr tablet Take 1 tablet (150 mg total) by mouth 2 (two) times daily with a meal. 60 tablet 0  . docusate sodium (COLACE) 100 MG capsule Take 300 mg by mouth daily as needed for mild constipation or  moderate constipation. As directed     . FLUoxetine (PROZAC) 10 MG tablet Take 10 mg by mouth daily.    . furosemide (LASIX) 40 MG tablet Take 120 mg by mouth 2 (two) times daily.    . hydrOXYzine (ATARAX/VISTARIL) 25 MG tablet Take 25 mg by mouth every 8 (eight) hours as needed for itching.    Mar[place] KLOR-CON M20 20 MEQ tablet Take 20 mEq by mouth daily.    Mar[place] lamoTRIgine (LAMICTAL) 100 MG tablet Take 1 tablet (100 mg total) by mouth 2 (two) times daily. 60 tablet 3  . levETIRAcetam (KEPPRA) 500 MG tablet Take 1 tablet (500 mg total) by mouth  daily. (Patient taking differently: Take 500 mg by mouth at bedtime.) 90 tablet 3  . loratadine (CLARITIN) 10 MG tablet Take 1 tablet (10 mg total) by mouth daily. 30 tablet 0  . metoprolol tartrate (LOPRESSOR) 25 MG tablet Take 0.5 tablets (12.5 mg total) by mouth 2 (two) times daily. 30 tablet 0  . omeprazole (PRILOSEC) 40 MG capsule Take 40 mg by mouth daily.    Marland Kitchen oxyCODONE-acetaminophen (PERCOCET/ROXICET) 5-325 MG tablet Take 1 tablet by mouth every 6 (six) hours as needed for pain.    . pregabalin (LYRICA) 50 MG capsule Take 1 capsule (50 mg total) by mouth at bedtime. (Patient taking differently: Take 50 mg by mouth 3 (three) times daily.) 30 capsule 0  . sevelamer carbonate (RENVELA) 800 MG tablet Take 3 tablets (2,400 mg total) by mouth 3 (three) times daily with meals. 240 tablet 1  . warfarin (COUMADIN) 2 MG tablet Take 2 tablets daily except 1 1/2 tablets on Sundays and Wednesdays or as directed (Patient taking differently: Take 3-4 mg by mouth See admin instructions. Take 2 tablets ( 4 mg) daily except 1 1/2 tablets  (3 mg) on Sundays and Wednesdays or as directed) 60 tablet 4  . gentamicin cream (GARAMYCIN) 0.1 % Apply 1 application topically daily. (Patient not taking: Reported on 03/07/2020) 15 g 0  . pioglitazone (ACTOS) 15 MG tablet Take 1 tablet (15 mg total) by mouth daily. (Patient not taking: No sig reported) 30 tablet 0  . potassium chloride  (KLOR-CON) 10 MEQ tablet Take 2 tablets (20 mEq total) by mouth daily. (Patient not taking: Reported on 03/07/2020) 30 tablet 0     Physical Exam: Blood pressure (!) 156/96, pulse 98, temperature 98.1 F (36.7 C), temperature source Oral, resp. rate (!) 29, height '5\' 9"'$  (1.753 m), weight 122.7 kg, SpO2 95 %. General: pleasant, obese white male who is sitting on the edge of his bed in NAD HEENT: head is normocephalic, atraumatic.  Sclera are noninjected.  PERRL.  Ears and nose without any masses or lesions.  Mouth is pink and moist Heart: regular, rate, and rhythm.  Normal s1,s2. No obvious murmurs, gallops, or rubs noted.  Palpable radial and pedal pulses bilaterally Lungs: CTAB, no wheezes, rhonchi, or rales noted.  Respiratory effort nonlabored Abd: soft, NT, obese, +BS, no masses, hernias, or organomegaly.  He does have a left-sided PD catheter in place. MS: all 4 extremities are symmetrical with no cyanosis, clubbing, but trace edema Skin: warm and dry with no masses, lesions, or rashes Neuro: Cranial nerves 2-12 grossly intact, sensation is normal throughout Psych: A&Ox3 with an appropriate affect.   Results for orders placed or performed during the hospital encounter of 03/07/20 (from the past 48 hour(s))  Glucose, capillary     Status: Abnormal   Collection Time: 03/18/20  4:19 PM  Result Value Ref Range   Glucose-Capillary 138 (H) 70 - 99 mg/dL    Comment: Glucose reference range applies only to samples taken after fasting for at least 8 hours.  Glucose, capillary     Status: Abnormal   Collection Time: 03/18/20  8:36 PM  Result Value Ref Range   Glucose-Capillary 188 (H) 70 - 99 mg/dL    Comment: Glucose reference range applies only to samples taken after fasting for at least 8 hours.  Heparin level (unfractionated)     Status: Abnormal   Collection Time: 03/18/20  8:59 PM  Result Value Ref Range   Heparin Unfractionated <0.10 (L) 0.30 -  0.70 IU/mL    Comment: (NOTE) If  heparin results are below expected values, and patient dosage has  been confirmed, suggest follow up testing of antithrombin III levels. Performed at [facility], [place] [address], [place], [place] [address]   Glucose, capillary     Status: Abnormal   Collection Time: 03/18/20 11:54 PM  Result Value Ref Range   Glucose-Capillary 111 (H) 70 - 99 mg/dL    Comment: Glucose reference range applies only to samples taken after fasting for at least 8 hours.  Glucose, capillary     Status: Abnormal   Collection Time: 03/19/20  3:33 AM  Result Value Ref Range   Glucose-Capillary 147 (H) 70 - 99 mg/dL    Comment: Glucose reference range applies only to samples taken after fasting for at least 8 hours.  Protime-INR     Status: Abnormal   Collection Time: 03/19/20  6:33 AM  Result Value Ref Range   Prothrombin Time 15.6 (H) 11.4 - 15.2 seconds   INR 1.3 (H) 0.8 - 1.2    Comment: (NOTE) INR goal varies based on device and disease states. Performed at [facility], [place] [address], [place], [place] [address]   Renal function panel (daily at 0500)     Status: Abnormal   Collection Time: 03/19/20  6:33 AM  Result Value Ref Range   Sodium 132 (L) 135 - 145 mmol/L   Potassium 4.1 3.5 - 5.1 mmol/L   Chloride 96 (L) 98 - 111 mmol/L   CO2 23 22 - 32 mmol/L   Glucose, Bld 154 (H) 70 - 99 mg/dL    Comment: Glucose reference range applies only to samples taken after fasting for at least 8 hours.   BUN 56 (H) 6 - 20 mg/dL   Creatinine, Ser 8.69 (H) 0.61 - 1.24 mg/dL   Calcium 9.4 8.9 - 10.3 mg/dL   Phosphorus 5.6 (H) 2.5 - 4.6 mg/dL   Albumin 2.5 (L) 3.5 - 5.0 g/dL   GFR, Estimated 7 (L) >60 mL/min    Comment: (NOTE) Calculated using the CKD-EPI Creatinine Equation (2021)    Anion gap 13 5 - 15    Comment: Performed at [facility] [address] [place], [place] [address]  CBC     Status: Abnormal   Collection Time: 03/19/20  6:33 AM  Result Value Ref Range   WBC 20.1 (H)  4.0 - 10.5 K/uL   RBC 3.09 (L) 4.22 - 5.81 MIL/uL   Hemoglobin 8.8 (L) 13.0 - 17.0 g/dL   HCT 27.3 (L) 39.0 - 52.0 %   MCV 88.3 80.0 - 100.0 fL   MCH 28.5 26.0 - 34.0 pg   MCHC 32.2 30.0 - 36.0 g/dL   RDW 17.9 (H) 11.5 - 15.5 %   Platelets 446 (H) 150 - 400 K/uL   nRBC 0.0 0.0 - 0.2 %    Comment: Performed at [facility] [address] [place], [place] [address]  Heparin level (unfractionated)     Status: None   Collection Time: 03/19/20  6:33 AM  Result Value Ref Range   Heparin Unfractionated 0.44 0.30 - 0.70 IU/mL    Comment: (NOTE) If heparin results are below expected values, and patient dosage has  been confirmed, suggest follow up testing of antithrombin III levels. Performed at [facility], [place] [address], [place], [place] [address]   Magnesium     Status: None   Collection Time:  03/19/20  6:33 AM  Result Value Ref Range   Magnesium 2.4 1.7 - 2.4 mg/dL    Comment: Performed at [facility] [address], [place], [place] [address]  Glucose, capillary     Status: Abnormal   Collection Time: 03/19/20  8:22 AM  Result Value Ref Range   Glucose-Capillary 122 (H) 70 - 99 mg/dL    Comment: Glucose reference range applies only to samples taken after fasting for at least 8 hours.   Comment 1 Notify RN    Comment 2 Document in Chart   Culture, blood (Routine X 2) w Reflex to ID Panel     Status: None (Preliminary result)   Collection Time: 03/19/20 10:30 AM   Specimen: BLOOD RIGHT HAND  Result Value Ref Range   Specimen Description BLOOD RIGHT HAND    Special Requests      BOTTLES DRAWN AEROBIC ONLY Blood Culture results may not be optimal due to an inadequate volume of blood received in culture bottles   Culture      NO GROWTH < 24 HOURS Performed at [facility] [address] [place], [place] [address]    Report Status PENDING   Culture, blood (Routine X 2) w Reflex to ID Panel     Status: None (Preliminary result)   Collection Time:  03/19/20 10:41 AM   Specimen: BLOOD RIGHT WRIST  Result Value Ref Range   Specimen Description BLOOD RIGHT WRIST    Special Requests      BOTTLES DRAWN AEROBIC AND ANAEROBIC Blood Culture adequate volume   Culture      NO GROWTH < 24 HOURS Performed at [facility], [place] [address], [place],  [address]    Report Status PENDING   Glucose, capillary     Status: Abnormal   Collection Time: 03/19/20 12:16 PM  Result Value Ref Range   Glucose-Capillary 152 (H) 70 - 99 mg/dL    Comment: Glucose reference range applies only to samples taken after fasting for at least 8 hours.  Glucose, capillary     Status: Abnormal   Collection Time: 03/19/20  6:11 PM  Result Value Ref Range   Glucose-Capillary 122 (H) 70 - 99 mg/dL    Comment: Glucose reference range applies only to samples taken after fasting for at least 8 hours.   Comment 1 Notify RN    Comment 2 Document in Chart   Glucose, capillary     Status: Abnormal   Collection Time: 03/19/20  8:06 PM  Result Value Ref Range   Glucose-Capillary 171 (H) 70 - 99 mg/dL    Comment: Glucose reference range applies only to samples taken after fasting for at least 8 hours.  Glucose, capillary     Status: Abnormal   Collection Time: 03/19/20 11:49 PM  Result Value Ref Range   Glucose-Capillary 157 (H) 70 - 99 mg/dL    Comment: Glucose reference range applies only to samples taken after fasting for at least 8 hours.  Protime-INR     Status: Abnormal   Collection Time: 03/20/20 12:44 AM  Result Value Ref Range   Prothrombin Time 15.6 (H) 11.4 - 15.2 seconds   INR 1.3 (H) 0.8 - 1.2    Comment: (NOTE) INR goal varies based on device and disease states. Performed at [facility], [place] [address] [place], [place] [address]   Heparin level (unfractionated)     Status: Abnormal   Collection Time: 03/20/20 12:44  AM  Result Value Ref Range   Heparin Unfractionated 0.27 (L) 0.30 - 0.70 IU/mL    Comment: (NOTE) If heparin results  are below expected values, and patient dosage has  been confirmed, suggest follow up testing of antithrombin III levels. Performed at [facility], [place] [address], [place], [place] [address]   Comprehensive metabolic panel     Status: Abnormal   Collection Time: 03/20/20 12:44 AM  Result Value Ref Range   Sodium 133 (L) 135 - 145 mmol/L   Potassium 3.7 3.5 - 5.1 mmol/L   Chloride 96 (L) 98 - 111 mmol/L   CO2 22 22 - 32 mmol/L   Glucose, Bld 156 (H) 70 - 99 mg/dL    Comment: Glucose reference range applies only to samples taken after fasting for at least 8 hours.   BUN 59 (H) 6 - 20 mg/dL   Creatinine, Ser 9.24 (H) 0.61 - 1.24 mg/dL   Calcium 9.1 8.9 - 10.3 mg/dL   Total Protein 6.7 6.5 - 8.1 g/dL   Albumin 2.2 (L) 3.5 - 5.0 g/dL   AST 40 15 - 41 U/L   ALT 15 0 - 44 U/L   Alkaline Phosphatase 76 38 - 126 U/L   Total Bilirubin 0.5 0.3 - 1.2 mg/dL   GFR, Estimated 7 (L) >60 mL/min    Comment: (NOTE) Calculated using the CKD-EPI Creatinine Equation (2021)    Anion gap 15 5 - 15    Comment: Performed at [facility] [address] [place], [place] [address]  CBC     Status: Abnormal   Collection Time: 03/20/20 12:44 AM  Result Value Ref Range   WBC 16.6 (H) 4.0 - 10.5 K/uL   RBC 2.80 (L) 4.22 - 5.81 MIL/uL   Hemoglobin 8.0 (L) 13.0 - 17.0 g/dL   HCT 24.8 (L) 39.0 - 52.0 %   MCV 88.6 80.0 - 100.0 fL   MCH 28.6 26.0 - 34.0 pg   MCHC 32.3 30.0 - 36.0 g/dL   RDW 17.4 (H) 11.5 - 15.5 %   Platelets 442 (H) 150 - 400 K/uL   nRBC 0.1 0.0 - 0.2 %    Comment: Performed at [facility] [address], [place], [place] [address]  Glucose, capillary     Status: Abnormal   Collection Time: 03/20/20  4:17 AM  Result Value Ref Range   Glucose-Capillary 150 (H) 70 - 99 mg/dL    Comment: Glucose reference range applies only to samples taken after fasting for at least 8 hours.  Glucose, capillary     Status: Abnormal   Collection Time: 03/20/20  8:05 AM  Result Value  Ref Range   Glucose-Capillary 102 (H) 70 - 99 mg/dL    Comment: Glucose reference range applies only to samples taken after fasting for at least 8 hours.  Body fluid culture w Gram Stain     Status: None (Preliminary result)   Collection Time: 03/20/20  8:12 AM   Specimen: Peritoneal Dialysate; Body Fluid  Result Value Ref Range   Specimen Description PERITONEAL DIALYSATE    Special Requests Immunocompromised    Gram Stain      RARE WBC PRESENT, PREDOMINANTLY MONONUCLEAR NO ORGANISMS SEEN Performed at [facility], [place] [address] [place], [place] [address]    Culture PENDING    Report Status PENDING   Glucose, capillary     Status: Abnormal   Collection Time: 03/20/20 11:53 AM  Result  Value Ref Range   Glucose-Capillary 105 (H) 70 - 99 mg/dL    Comment: Glucose reference range applies only to samples taken after fasting for at least 8 hours.   MR LUMBAR SPINE WO CONTRAST  Result Date: 03/19/2020 CLINICAL DATA:  Low back pain with possible infection. EXAM: MRI LUMBAR SPINE WITHOUT CONTRAST TECHNIQUE: Multiplanar, multisequence MR imaging of the lumbar spine was performed. No intravenous contrast was administered. COMPARISON:  None. FINDINGS: Segmentation:  Standard. Alignment:  Physiologic. Vertebrae:  No fracture, evidence of discitis, or bone lesion. Conus medullaris and cauda equina: Conus extends to the L1 level. Conus and cauda equina appear normal. Paraspinal and other soft tissues: There is an incompletely visualized mixed signal intensity collection anterior to the right iliac bone that measures at least 5.4 cm. Disc levels: The disc spaces above L3 are normal. L3-4: Intermediate disc bulge with mild spinal canal stenosis. No foraminal stenosis. L4-5: Small disc bulge without stenosis. L5-S1: No disc herniation or stenosis.  Moderate facet hypertrophy. IMPRESSION: 1. Incompletely visualized retroperitoneal collection anterior to the right iliac. This may be an abscess or  hematoma. CT of the abdomen and pelvis with contrast is recommended. 2. Mild lumbar degenerative disc disease without stenosis. Electronically Signed   By: Ulyses Jarred M.D.   On: 03/19/2020 19:44      Assessment/Plan CHF exacerbation - improved H/O MIs/CVAs H/O DVT - coumadin on hold, INR 1.3 DM HTN Seizures HLD   ESRD  Patient is currently on PD but is being switched to HD and renal would like for his peritoneal catheter to be removed.  Since he is optimized (discussed with Dr. Cruzita Lederer), his INR is 1.3, and he now has HD access, we will plan to proceed with removal tomorrow in the OR.  This was discussed with the patient on 2/22 and he was agreeable to be proceed.  NPO p MN.   FEN - NPO p MN VTE - heparin to be held 6hrs pre-op, coumadin on hold ID - ancef on call to [place], [facility] 03/20/2020, 2:18 PM Please see Amion for pager number during day hours 7:00am-4:30pm or 7:00am -11:30am on weekends

## 2020-03-20 NOTE — Procedures (Signed)
Interventional Radiology Procedure Note  Procedure: 1. Right IJ tunneled HD catheter placement 2. Right IJ non-tunneled HD cath removal  Indication: Renal failure  Findings:  Right IJ tunneled HD catheter ready for use.  Please refer to procedural dictation for full description.  Complications: None  EBL: < 10 mL  Miachel Roux, MD [phone]

## 2020-03-20 NOTE — Progress Notes (Signed)
[facility] PROGRESS NOTE    Progress Note  Max Pittman.  [id number] DOB: July 18, 1975 DOA: 03/07/2020 PCP: Chesley Noon, MD     Brief Narrative:   Max Pittman. is an 45 y.o. male past medical history of end-stage renal disease on peritoneal dialysis, diabetes mellitus type 2, essential hypertension, DVT on Coumadin sleep apnea comes into the hospital for shortness of breath on arrival to the ED had a cardiac arrest with 12 minutes of CPR before ROSC admitted to the ICU and intubated on 03/07/2020 self extubated on 01/08/2021 transferred to the hospitalist service on 01/11/2021, now requiring CRRT in the ICU.  Was transitioned to hemodialysis, nephrology and IR working on removing his temporary catheter and placing tunneled catheter for permanent dialysis and removing her peritoneal dialysis catheter.   Assessment/Plan:   Acute respiratory failure with hypoxia (HCC) leading to cardiac arrest: In the setting of fluid overload. 2D echo showed an EF of 55% no wall motion abnormality. He has been weaned to room air.  End-stage renal disease on peritoneal dialysis/hyperkalemia/hypomagnesemia: Nephrology was consulted and CRRT was started in the ICU temporary catheter was placed. IR was consulted to try to remove his temporary catheter and place a tunneled catheter as he will need from now on ongoing hemodialysis. He was able to get his tunneled catheter on 03/19/2020 but due to an elevated white count procedure was held.  Leucocytosis: With a white count this morning of 16,000 He was initially treated for right lower lobe infiltrate concern about aspiration he completed his course of IV Unasyn. Currently he has no respiratory symptoms denies any cough. Blood cultures and peritoneal fluid have been sent for cultures. He denies any abdominal pain nausea or vomiting.  No abdominal tenderness physical exam MRI of the lumbar spine showed retroperitoneal collection  along the anterior right iliac concern about an abscess or hematoma. Check a CT scan of the abdomen and pelvis to rule out abscess or hematoma. With a white count of 16,000 and a T-max of 98.7.  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 He was hospitalized at that time and completed 6 weeks of IV antibiotics now on suppressive therapy with Keflex. Blood cultures and fluid cultures were drawn on 03/19/2018 have remained negative till date.  Cardiogenic shock: Now resolved.  Obstructive sleep apnea: Noncompliant with CPAP.  Acute metabolic encephalopathy: Probably due to respiratory distress.  Deconditioning: Physical therapy evaluated the patient recommended skilled nursing facility continue to work with physical therapy.   DVT prophylaxis: lovenox Family Communication:none Status is: Inpatient  Remains inpatient appropriate because:Hemodynamically unstable   Dispo: The patient is from: Home              Anticipated d/c is to: Home              Anticipated d/c date is: > 3 days              Patient currently is not medically stable to d/c.   Difficult to place patient No        Code Status:     Code Status Orders  (From admission, onward)         Start     Ordered   03/07/20 0440  Full code  Continuous        03/07/20 0441  Code Status History    Date Active Date Inactive Code Status Order ID Comments User Context   07/13/2019 2250 07/28/2019 2220 Full Code [id number]  Bethena Roys, MD Inpatient   07/01/2019 0254 07/06/2019 2206 Full Code [id number]  Edmonia Lynch, DO ED   06/02/2019 1610 06/17/2019 1520 Full Code [id number]  Elizabeth Sauer Inpatient   06/02/2019 1610 06/02/2019 1610 Full Code [id number]  Elizabeth Sauer Inpatient   05/21/2019 0025 06/02/2019 1557 Full Code [id number]  Danne Baxter Inpatient   03/22/2019  [id number] 03/24/2019 1612 Full Code [id number]  Bethena Roys, MD Inpatient   Advance Care Planning Activity        IV Access:    Peripheral IV   Procedures and diagnostic studies:   MR LUMBAR SPINE WO CONTRAST  Result Date: 03/19/2020 CLINICAL DATA:  Low back pain with possible infection. EXAM: MRI LUMBAR SPINE WITHOUT CONTRAST TECHNIQUE: Multiplanar, multisequence MR imaging of the lumbar spine was performed. No intravenous contrast was administered. COMPARISON:  None. FINDINGS: Segmentation:  Standard. Alignment:  Physiologic. Vertebrae:  No fracture, evidence of discitis, or bone lesion. Conus medullaris and cauda equina: Conus extends to the L1 level. Conus and cauda equina appear normal. Paraspinal and other soft tissues: There is an incompletely visualized mixed signal intensity collection anterior to the right iliac bone that measures at least 5.4 cm. Disc levels: The disc spaces above L3 are normal. L3-4: Intermediate disc bulge with mild spinal canal stenosis. No foraminal stenosis. L4-5: Small disc bulge without stenosis. L5-S1: No disc herniation or stenosis.  Moderate facet hypertrophy. IMPRESSION: 1. Incompletely visualized retroperitoneal collection anterior to the right iliac. This may be an abscess or hematoma. CT of the abdomen and pelvis with contrast is recommended. 2. Mild lumbar degenerative disc disease without stenosis. Electronically Signed   By: Ulyses Jarred M.D.   On: 03/19/2020 19:44     Medical Consultants:    None.   Subjective:    Max Pittman. relates he continues to have back pain.  Objective:    Vitals:   03/20/20 0413 03/20/20 0414 03/20/20 0633 03/20/20 0804  BP: 130/86 130/86 (!) 143/91 (!) 146/89  Pulse: (!) 103 (!) 103  98  Resp: '17 17  20  '$ Temp: 98.7 F (37.1 C) 98.7 F (37.1 C) 98.4 F (36.9 C) 97.6 F (36.4 C)  TempSrc: Oral  Oral Oral  SpO2: 98%   97%  Weight:   122.7 kg   Height:       SpO2: 97 % O2 Flow Rate  (L/min): 4 L/min FiO2 (%): 97 %   Intake/Output Summary (Last 24 hours) at 03/20/2020 1153 Last data filed at 03/19/2020 2300 Gross per 24 hour  Intake 120 ml  Output --  Net 120 ml   Filed Weights   03/19/20 1800 03/20/20 0411 03/20/20 [id number]  Weight: 124.9 kg 127.9 kg 122.7 kg    Exam: General exam: In no acute distress. Respiratory system: Good air movement and clear to auscultation. Cardiovascular system: S1 & S2 heard, RRR. No JVD. Gastrointestinal system: Abdomen is nondistended, soft and nontender.  Extremities: No pedal edema. Skin: No rashes, lesions or ulcers Psychiatry: Judgement and insight appear normal. Mood & affect appropriate.    Data Reviewed:    Labs: Basic Metabolic Panel: Recent Labs  Lab 03/15/20 0605 03/16/20 0210 03/17/20 0217 03/18/20 0128 03/19/20 0633 03/20/20 0044  NA 137 137 136 135 132* 133*  K 4.0 3.9 4.3 3.5 4.1  3.7  CL 98 101 99 96* 96* 96*  CO2 '24 22 22 23 23 22  '$ GLUCOSE 156* 249* 250* 257* 154* 156*  BUN 34* 38* 39* 46* 56* 59*  CREATININE 5.58* 6.37* 7.26* 7.91* 8.69* 9.24*  CALCIUM 10.3 9.7 9.9 9.5 9.4 9.1  MG 2.7* 2.5* 2.6* 2.3 2.4  --   PHOS 4.3 4.2 5.1* 5.4* 5.6*  --    GFR Estimated Creatinine Clearance: 13.3 mL/min (A) (by C-G formula based on SCr of 9.24 mg/dL (H)). Liver Function Tests: Recent Labs  Lab 03/15/20 0605 03/16/20 0210 03/17/20 0217 03/18/20 0128 03/19/20 0633 03/20/20 0044  AST 28 25  --   --   --  40  ALT 30 24  --   --   --  15  ALKPHOS 110 91  --   --   --  76  BILITOT 0.5 0.6  --   --   --  0.5  PROT 8.5* 7.5  --   --   --  6.7  ALBUMIN 2.9* 2.7* 2.9* 2.5* 2.5* 2.2*   No results for input(s): LIPASE, AMYLASE in the last 168 hours. No results for input(s): AMMONIA in the last 168 hours. Coagulation profile Recent Labs  Lab 03/16/20 0210 03/17/20 0210 03/18/20 0128 03/19/20 0633 03/20/20 0044  INR 1.1 1.3* 1.4* 1.3* 1.3*   COVID-19 Labs  No results for input(s): DDIMER, FERRITIN,  LDH, CRP in the last 72 hours.  Lab Results  Component Value Date   SARSCOV2NAA NEGATIVE 03/07/2020   [place] NEGATIVE 07/13/2019   [place] NEGATIVE 06/30/2019   House NEGATIVE 05/20/2019    CBC: Recent Labs  Lab 03/14/20 0233 03/15/20 0605 03/16/20 0210 03/17/20 0210 03/18/20 0128 03/19/20 0633 03/20/20 0044  WBC 12.2*   < > 12.7* 14.4* 14.8* 20.1* 16.6*  NEUTROABS 5.6  --   --   --   --   --   --   HGB 8.0*   < > 9.2* 9.5* 8.7* 8.8* 8.0*  HCT 27.0*   < > 30.1* 30.5* 27.0* 27.3* 24.8*  MCV 92.5   < > 91.5 90.8 90.3 88.3 88.6  PLT 196   < > 279 348 373 446* 442*   < > = values in this interval not displayed.   Cardiac Enzymes: No results for input(s): CKTOTAL, CKMB, CKMBINDEX, TROPONINI in the last 168 hours. BNP (last 3 results) No results for input(s): PROBNP in the last 8760 hours. CBG: Recent Labs  Lab 03/19/20 1811 03/19/20 2006 03/19/20 2349 03/20/20 0417 03/20/20 0805  GLUCAP 122* 171* 157* 150* 102*   D-Dimer: No results for input(s): DDIMER in the last 72 hours. Hgb A1c: No results for input(s): HGBA1C in the last 72 hours. Lipid Profile: No results for input(s): CHOL, HDL, LDLCALC, TRIG, CHOLHDL, LDLDIRECT in the last 72 hours. Thyroid function studies: No results for input(s): TSH, T4TOTAL, T3FREE, THYROIDAB in the last 72 hours.  Invalid input(s): FREET3 Anemia work up: No results for input(s): VITAMINB12, FOLATE, FERRITIN, TIBC, IRON, RETICCTPCT in the last 72 hours. Sepsis Labs: Recent Labs  Lab 03/17/20 0210 03/18/20 0128 03/19/20 0633 03/20/20 0044  WBC 14.4* 14.8* 20.1* 16.6*   Microbiology Recent Results (from the past 240 hour(s))  Culture, blood (Routine X 2) w Reflex to ID Panel     Status: None (Preliminary result)   Collection Time: 03/19/20 10:30 AM   Specimen: BLOOD RIGHT HAND  Result Value Ref Range Status   Specimen Description BLOOD RIGHT HAND  Final   Special Requests   Final    BOTTLES DRAWN AEROBIC  ONLY Blood Culture results may not be optimal due to an inadequate volume of blood received in culture bottles   Culture   Final    NO GROWTH < 24 HOURS Performed at [facility] [address], [place], [place] [address]    Report Status PENDING  Incomplete  Culture, blood (Routine X 2) w Reflex to ID Panel     Status: None (Preliminary result)   Collection Time: 03/19/20 10:41 AM   Specimen: BLOOD RIGHT WRIST  Result Value Ref Range Status   Specimen Description BLOOD RIGHT WRIST  Final   Special Requests   Final    BOTTLES DRAWN AEROBIC AND ANAEROBIC Blood Culture adequate volume   Culture   Final    NO GROWTH < 24 HOURS Performed at [facility], [place] [address], [place], [place] [address]    Report Status PENDING  Incomplete     Medications:   . (feeding supplement) PROSource Plus  30 mL Oral BID WC  . amLODipine  5 mg Oral Daily  . aspirin  81 mg Oral Daily  . atorvastatin  40 mg Oral q1800  . buPROPion  150 mg Oral BID WC  . cephALEXin  250 mg Oral Q12H  . Chlorhexidine Gluconate Cloth  6 each Topical Daily  . docusate sodium  100 mg Oral BID  . feeding supplement  237 mL Oral BID BM  . gentamicin cream  1 application Topical Daily  . insulin aspart  0-20 Units Subcutaneous Q4H  . lamoTRIgine  100 mg Oral BID  . levETIRAcetam  500 mg Oral Daily  . mouth rinse  15 mL Mouth Rinse BID  . melatonin  5 mg Oral QHS  . metoprolol tartrate  12.5 mg Oral BID  . multivitamin  1 tablet Oral QHS  . pantoprazole  40 mg Oral Daily  . polyethylene glycol  17 g Oral Daily  . sodium chloride flush  10-40 mL Intracatheter Q12H   Continuous Infusions: .  ceFAZolin (ANCEF) IV    . dialysis solution 1.5% low-MG/low-CA    . dialysis solution 2.5% low-MG/low-CA    . heparin 2,600 Units/hr (03/20/20 0946)      LOS: 13 days   Charlynne Cousins  [facility]  03/20/2020, 11:53 AM

## 2020-03-20 NOTE — Progress Notes (Signed)
Updated pt's GF Gildardo Pounds. Answered all her questions and concerns. Will continue to monitor.

## 2020-03-20 NOTE — Progress Notes (Signed)
[place] for IV heparin>>warfarin Indication: Hx PE, Chronic DVT  Labs: Recent Labs    03/17/20 0217 03/17/20 1116 03/18/20 0128 03/18/20 1115 03/18/20 2059 03/19/20 0633 03/20/20 0044  HGB  --   --  8.7*  --   --  8.8* 8.0*  HCT  --   --  27.0*  --   --  27.3* 24.8*  PLT  --   --  373  --   --  446* 442*  LABPROT  --   --  16.3*  --   --  15.6* 15.6*  INR  --   --  1.4*  --   --  1.3* 1.3*  HEPARINUNFRC 1.02*   < > 0.12*   < > <0.10* 0.44 0.27*  CREATININE 7.26*  --  7.91*  --   --  8.69*  --    < > = values in this interval not displayed.   Assessment: 73 yoM on warfarin PTA for hx PE and chronic DVTs admitted s/p cardiac arrest. Warfarin held, heparin started 2/12 with INR <2.  Heparin level now 0.27 units/ml  No issues noted with infusion, no bleeding reported  Goal of Therapy:  INR goal 2-3 Heparin level 0.3-0.7 units/ml Monitor platelets by anticoagulation protocol: Yes   Plan:  Increase IV heparin to 2600 units/hr  Daily heparin level and cbc  Excell Seltzer, PharmD Clinical Pharmacist 03/20/2020 1:54 AM

## 2020-03-20 NOTE — Progress Notes (Signed)
[place] for IV heparin>>warfarin Indication: Hx PE, Chronic DVT  Labs: Recent Labs    03/18/20 0128 03/18/20 1115 03/18/20 2059 03/19/20 0633 03/20/20 0044  HGB 8.7*  --   --  8.8* 8.0*  HCT 27.0*  --   --  27.3* 24.8*  PLT 373  --   --  446* 442*  LABPROT 16.3*  --   --  15.6* 15.6*  INR 1.4*  --   --  1.3* 1.3*  HEPARINUNFRC 0.12*   < > <0.10* 0.44 0.27*  CREATININE 7.91*  --   --  8.69* 9.24*   < > = values in this interval not displayed.   Assessment: 61 yoM on warfarin PTA for hx PE and chronic DVTs admitted s/p cardiac arrest. Warfarin held, heparin started 2/12 with INR <2.  Pt s/p tunneled HD line today by IR - will resume infusion 4h after. Heparin to be held at 0500 tomorrow (2/24 am) with plans to return to IR for PD cath removal.  Goal of Therapy:  INR goal 2-3 Heparin level 0.3-0.7 units/ml Monitor platelets by anticoagulation protocol: Yes   Plan:  -Restart heparin 2600 units/h no bolus at 1900 -Heparin to stop at 0500 2/24 for OR so will defer checking a repeat heparin level -F/U restarting heparin post/op   Arrie Senate, PharmD, BCPS, [facility] Clinical Pharmacist [phone] Please check AMION for all [place] numbers 03/20/2020

## 2020-03-20 NOTE — Progress Notes (Signed)
Patient ID: Max Pittman., male   DOB: November 28, 1975, 45 y.o.   MRN: [id number] Max Pittman Progress Note   Assessment/ Plan:   1.  Acute hypoxic respiratory failure/ cardiac arrest: due to severe volume overload/pulmonary edema causing asystolic cardiac arrest. Resolved w/ CRRT.  2. ESRD: severe anasarca / vol overload on admission. Treated w/ CRRT x 6 days with net negative UF of 31 L. Wt's dropped from 158 to 122 kg. Suspected membrane failure.  Have d/w pt's primary neph MD, will plan for transition to hemodialysis. Needs new TDC, IR is planning for this today.  Plan PD tonight again.  Have d/w gen surg about possible PD cath removal as well.  3. Anemia ckd: Hb in 8- 9 range, no esa given here. Plan darbe 100 ug weekly starting today.  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 Nutrition: On renal diet without any evidence of aspiration. 6. Hypertension: Blood pressures intermittently elevated, monitor with peritoneal dialysis/ultrafiltration. 7. H/o VTE - on IV Heparin. Cont to hold coumadin due to pending procedures 8. ^WBC - blood cx's ordered neg x 2. WBC down. IV Ancef started yesterday.  Mild blood tinge to PD effluent this am > sent PD fluid for cell ct and culture. Could be mild trauma from fall yesterday. Abdomen benign on exam and no abd pain.    Max Splinter, MD 03/20/2020, 11:32 AM    Subjective:   Seen in room, no c/o today. Denies abd pain. PD effluent today was bloody/ pinkish, we sent it for cell count and culture. Pt denies any abd pain. He did fall out of bed last night.    Objective:   BP (!) 146/89 (BP Location: Right Arm)   Pulse 98   Temp 97.6 F (36.4 C) (Oral)   Resp 20   Ht '5\' 9"'$  (1.753 m)   Wt 122.7 kg   SpO2 97%   BMI 39.95 kg/m   Physical Exam: Gen: Resting comfortably in bed CVS: Pulse regular rhythm, normal rate, S1 and S2 normal.   Resp:  no rales bilaterally Abd: Soft, obese, nontender, PD catheter in  situ Ext: no LE or UE edema  Thrombosed LUA AVF   R IJ temp HD cath    Labs: BMET Recent Labs  Lab 03/14/20 0233 03/15/20 0605 03/16/20 0210 03/17/20 0217 03/18/20 0128 03/19/20 0633 03/20/20 0044  NA 135 137 137 136 135 132* 133*  K 4.0 4.0 3.9 4.3 3.5 4.1 3.7  CL 101 98 101 99 96* 96* 96*  CO2 '24 24 22 22 23 23 22  '$ GLUCOSE 121* 156* 249* 250* 257* 154* 156*  BUN 25* 34* 38* 39* 46* 56* 59*  CREATININE 3.84* 5.58* 6.37* 7.26* 7.91* 8.69* 9.24*  CALCIUM 9.4 10.3 9.7 9.9 9.5 9.4 9.1  PHOS 4.6 4.3 4.2 5.1* 5.4* 5.6*  --    CBC Recent Labs  Lab 03/14/20 0233 03/15/20 0605 03/17/20 0210 03/18/20 0128 03/19/20 0633 03/20/20 0044  WBC 12.2*   < > 14.4* 14.8* 20.1* 16.6*  NEUTROABS 5.6  --   --   --   --   --   HGB 8.0*   < > 9.5* 8.7* 8.8* 8.0*  HCT 27.0*   < > 30.5* 27.0* 27.3* 24.8*  MCV 92.5   < > 90.8 90.3 88.3 88.6  PLT 196   < > 348 373 446* 442*   < > = values in this interval not displayed.  Medications:    . (feeding supplement) PROSource Plus  30 mL Oral BID WC  . amLODipine  5 mg Oral Daily  . aspirin  81 mg Oral Daily  . atorvastatin  40 mg Oral q1800  . buPROPion  150 mg Oral BID WC  . cephALEXin  250 mg Oral Q12H  . Chlorhexidine Gluconate Cloth  6 each Topical Daily  . docusate sodium  100 mg Oral BID  . feeding supplement  237 mL Oral BID BM  . gentamicin cream  1 application Topical Daily  . insulin aspart  0-20 Units Subcutaneous Q4H  . lamoTRIgine  100 mg Oral BID  . levETIRAcetam  500 mg Oral Daily  . mouth rinse  15 mL Mouth Rinse BID  . melatonin  5 mg Oral QHS  . metoprolol tartrate  12.5 mg Oral BID  . multivitamin  1 tablet Oral QHS  . pantoprazole  40 mg Oral Daily  . polyethylene glycol  17 g Oral Daily  . sodium chloride flush  10-40 mL Intracatheter Q12H

## 2020-03-21 ENCOUNTER — Inpatient Hospital Stay (HOSPITAL_COMMUNITY): Payer: Medicare Other

## 2020-03-21 ENCOUNTER — Encounter (HOSPITAL_COMMUNITY): Payer: Self-pay | Admitting: Internal Medicine

## 2020-03-21 DIAGNOSIS — Z7901 Long term (current) use of anticoagulants: Secondary | ICD-10-CM | POA: Diagnosis not present

## 2020-03-21 DIAGNOSIS — J9601 Acute respiratory failure with hypoxia: Secondary | ICD-10-CM | POA: Diagnosis not present

## 2020-03-21 DIAGNOSIS — N186 End stage renal disease: Secondary | ICD-10-CM | POA: Diagnosis not present

## 2020-03-21 DIAGNOSIS — G9340 Encephalopathy, unspecified: Secondary | ICD-10-CM | POA: Diagnosis not present

## 2020-03-21 LAB — GLUCOSE, CAPILLARY
Glucose-Capillary: 148 mg/dL — ABNORMAL HIGH (ref 70–99)
Glucose-Capillary: 122 mg/dL — ABNORMAL HIGH (ref 70–99)
Glucose-Capillary: 99 mg/dL (ref 70–99)
Glucose-Capillary: 85 mg/dL (ref 70–99)

## 2020-03-21 LAB — CBC WITH DIFFERENTIAL/PLATELET
Abs Immature Granulocytes: 0.16 10*3/uL — ABNORMAL HIGH (ref 0.00–0.07)
Basophils Absolute: 0.1 10*3/uL (ref 0.0–0.1)
Basophils Relative: 1 %
Eosinophils Absolute: 0.7 10*3/uL — ABNORMAL HIGH (ref 0.0–0.5)
Eosinophils Relative: 5 %
HCT: 24.7 % — ABNORMAL LOW (ref 39.0–52.0)
Hemoglobin: 7.7 g/dL — ABNORMAL LOW (ref 13.0–17.0)
Immature Granulocytes: 1 %
Lymphocytes Relative: 15 %
Lymphs Abs: 2 10*3/uL (ref 0.7–4.0)
MCH: 28 pg (ref 26.0–34.0)
MCHC: 31.2 g/dL (ref 30.0–36.0)
MCV: 89.8 fL (ref 80.0–100.0)
Monocytes Absolute: 1.2 10*3/uL — ABNORMAL HIGH (ref 0.1–1.0)
Monocytes Relative: 9 %
Neutro Abs: 8.9 10*3/uL — ABNORMAL HIGH (ref 1.7–7.7)
Neutrophils Relative %: 69 %
Platelets: 457 10*3/uL — ABNORMAL HIGH (ref 150–400)
RBC: 2.75 MIL/uL — ABNORMAL LOW (ref 4.22–5.81)
RDW: 17.2 % — ABNORMAL HIGH (ref 11.5–15.5)
WBC: 13 10*3/uL — ABNORMAL HIGH (ref 4.0–10.5)
nRBC: 0 % (ref 0.0–0.2)

## 2020-03-21 LAB — HEPATITIS B SURFACE ANTIGEN: Hepatitis B Surface Ag: NONREACTIVE

## 2020-03-21 LAB — BASIC METABOLIC PANEL
Anion gap: 16 — ABNORMAL HIGH (ref 5–15)
BUN: 58 mg/dL — ABNORMAL HIGH (ref 6–20)
CO2: 21 mmol/L — ABNORMAL LOW (ref 22–32)
Calcium: 8.7 mg/dL — ABNORMAL LOW (ref 8.9–10.3)
Chloride: 96 mmol/L — ABNORMAL LOW (ref 98–111)
Creatinine, Ser: 9.64 mg/dL — ABNORMAL HIGH (ref 0.61–1.24)
GFR, Estimated: 6 mL/min — ABNORMAL LOW (ref >60)
Glucose, Bld: 97 mg/dL (ref 70–99)
Potassium: 3.8 mmol/L (ref 3.5–5.1)
Sodium: 133 mmol/L — ABNORMAL LOW (ref 135–145)

## 2020-03-21 LAB — PROTIME-INR
INR: 1.4 — ABNORMAL HIGH (ref 0.8–1.2)
Prothrombin Time: 16.5 seconds — ABNORMAL HIGH (ref 11.4–15.2)

## 2020-03-21 LAB — HEPARIN LEVEL (UNFRACTIONATED): Heparin Unfractionated: 0.59 IU/mL (ref 0.30–0.70)

## 2020-03-21 LAB — HEPATITIS B CORE ANTIBODY, TOTAL: Hep B Core Total Ab: NONREACTIVE

## 2020-03-21 LAB — HEPATITIS B SURFACE ANTIBODY,QUALITATIVE: Hep B S Ab: NONREACTIVE

## 2020-03-21 IMAGING — CT CT ABD-PELV W/ CM
2 of 5 series · 16 of 46 positions shown, 18 images · IV contrast (omnipaque)
Comparison: [DATE]

CLINICAL DATA: Diffuse abdominal pain

EXAM:
CT ABDOMEN AND PELVIS WITH CONTRAST
TECHNIQUE: Multidetector CT imaging of the abdomen and pelvis was performed
using the standard protocol following bolus administration of
intravenous contrast. Sagittal and coronal MPR images reconstructed
from axial data set.
CONTRAST:  100mL OMNIPAQUE IOHEXOL 300 MG/ML SOLN IV. Dilute oral
contrast.

[Series 3: abd/ pelvis 5.0 i30f 2 · axial · 0.98mm/px · z∈[+731,+1156]mm · 13 of 97 slices shown, 15 images]
[im 6/97  soft-tissue]
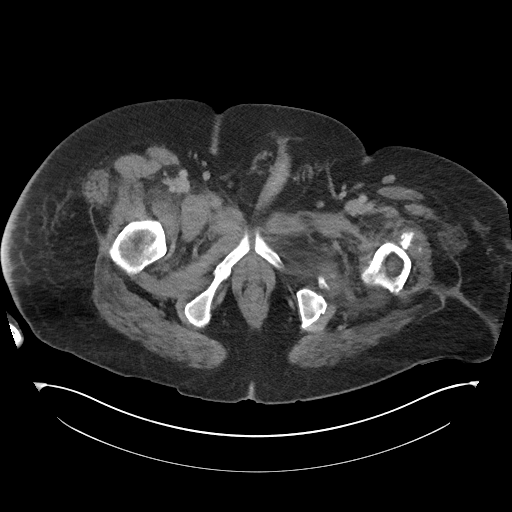
[im 6/97  bone]
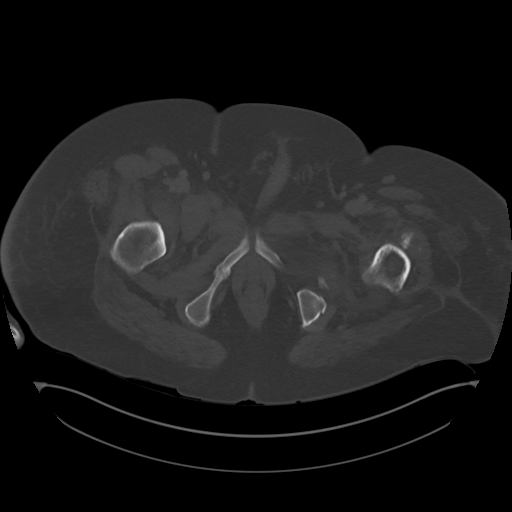
[im 16/97  soft-tissue]
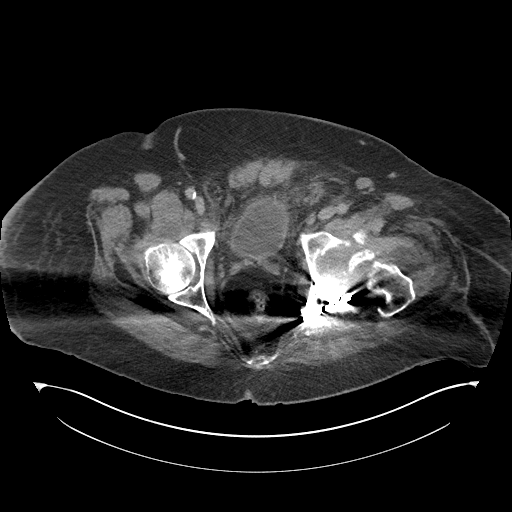
[im 21/97  soft-tissue]
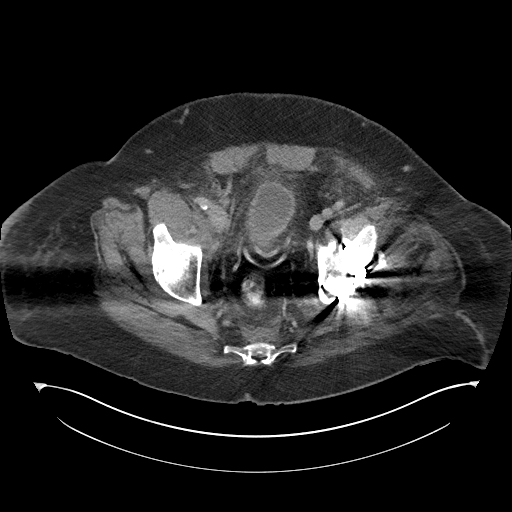
[im 26/97  soft-tissue]
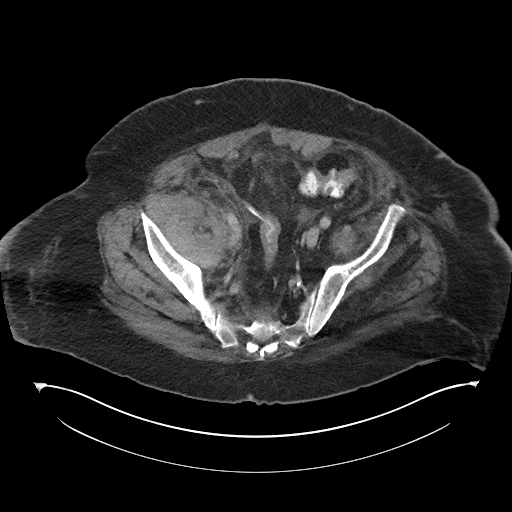
[im 36/97  soft-tissue]
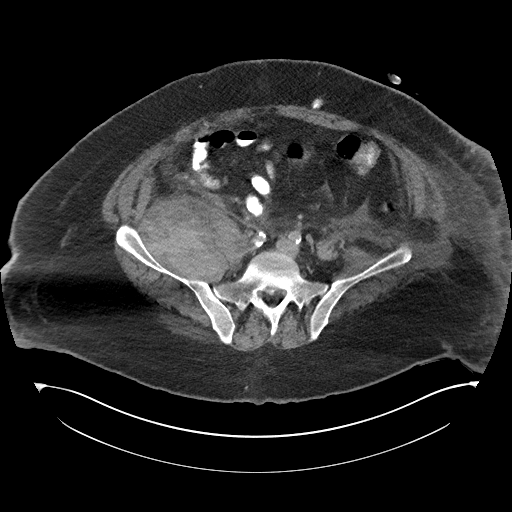
[im 41/97  soft-tissue]
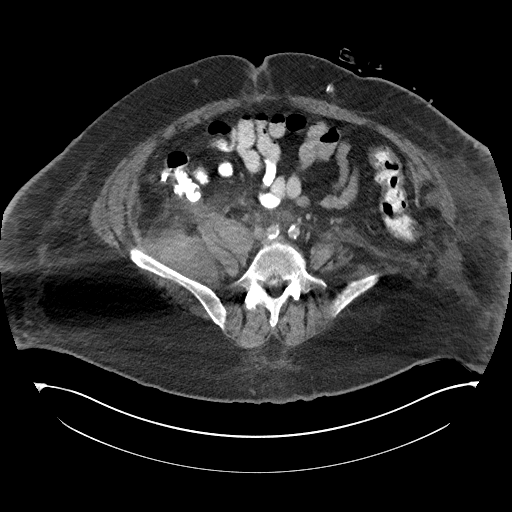
[im 51/97  soft-tissue]
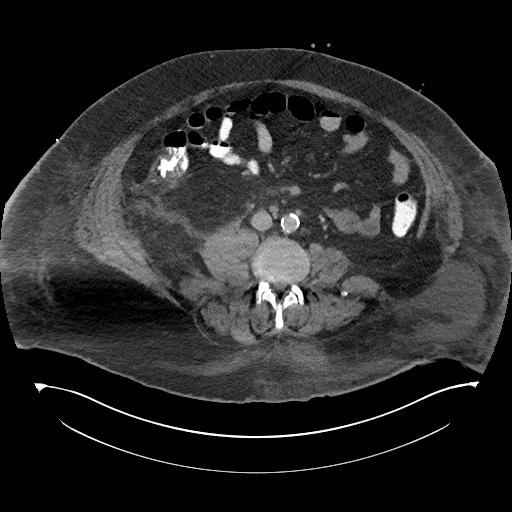
[im 56/97  soft-tissue]
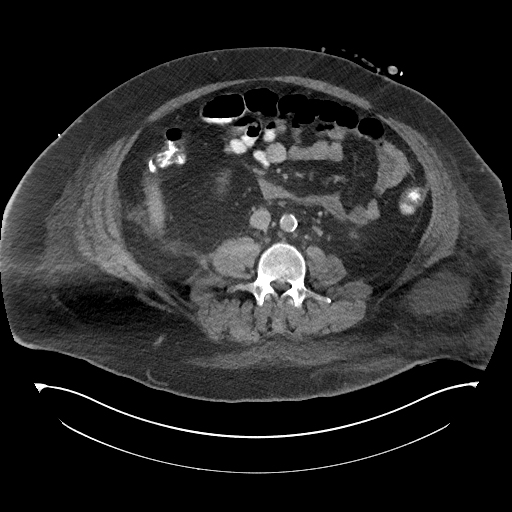
[im 61/97  soft-tissue]
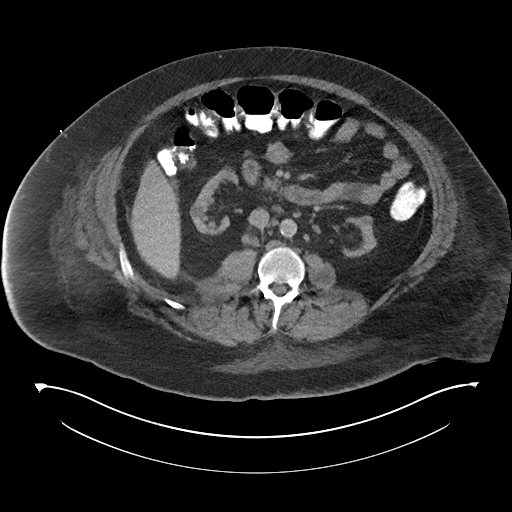
[im 61/97  bone]
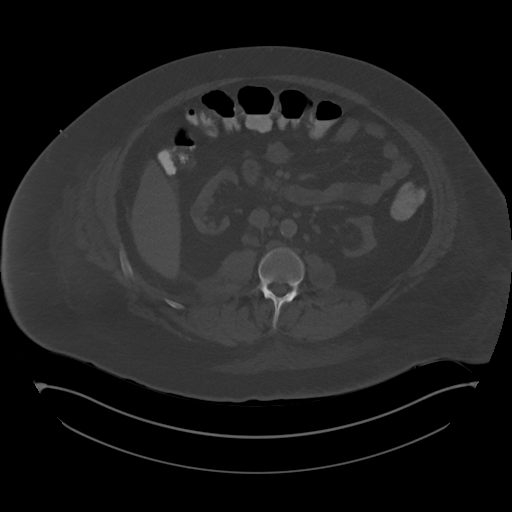
[im 71/97  soft-tissue]
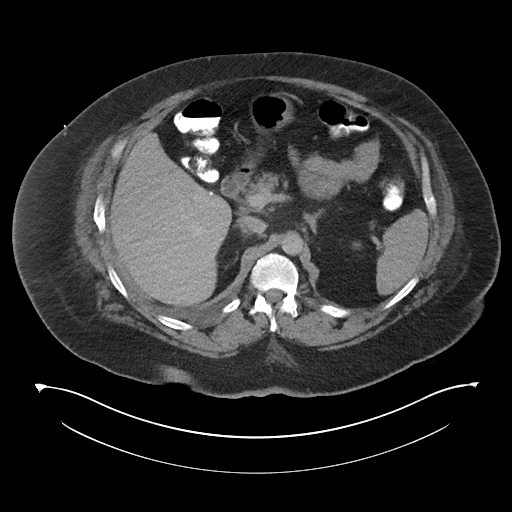
[im 76/97  soft-tissue]
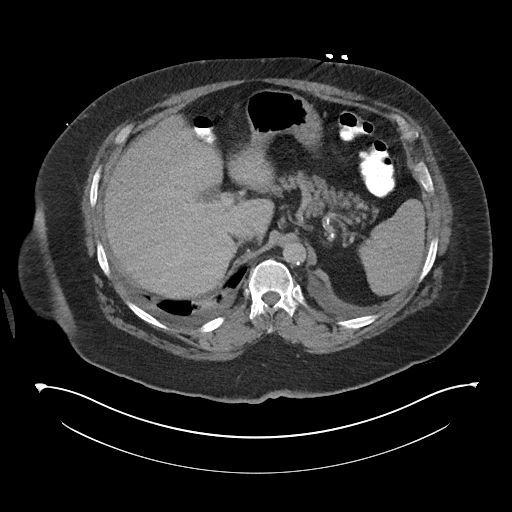
[im 81/97  soft-tissue]
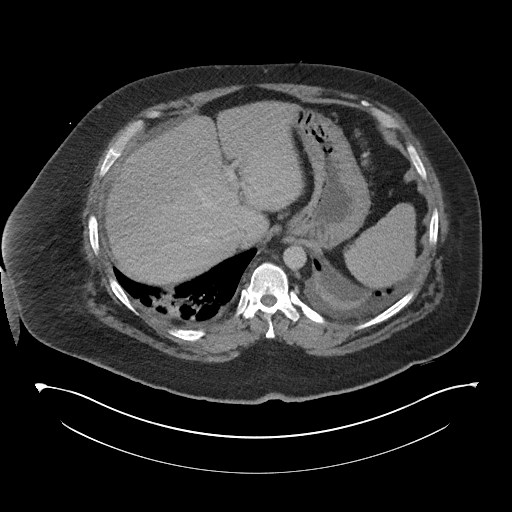
[im 91/97  soft-tissue]
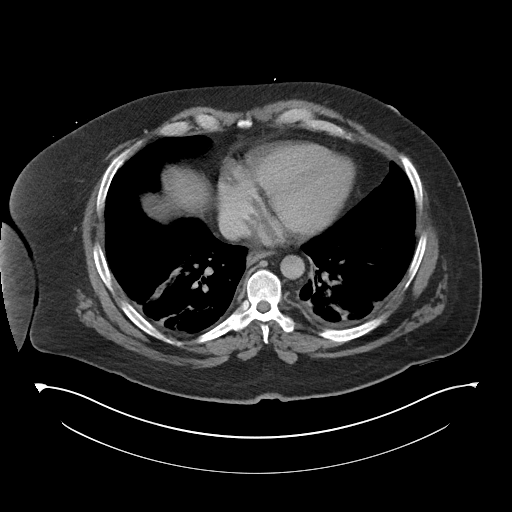

[Series 6: coronal soft tissue · coronal · 0.96mm/px · 3 of 107 slices shown]
[im 36/107  soft-tissue]
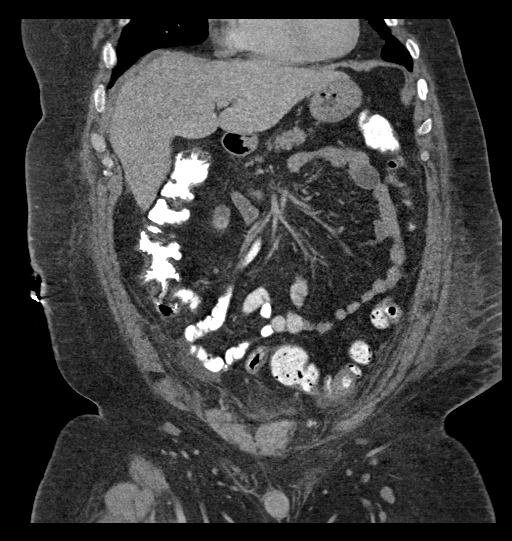
[im 48/107  soft-tissue]
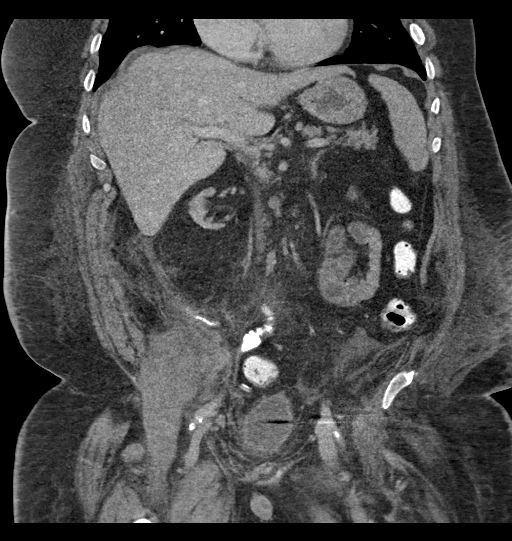
[im 59/107  soft-tissue]
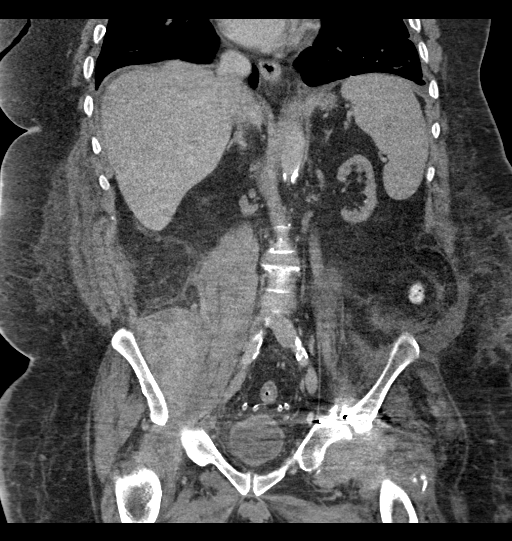

[16 of 46 positions shown; findings below may reference images not displayed]

FINDINGS: Lower chest: Bibasilar atelectasis and small pleural effusions
greater on LEFT.

Hepatobiliary: Gallbladder surgically absent.  Liver unremarkable.

Pancreas: Normal appearance

Spleen: Normal appearance

Adrenals/Urinary Tract: Adrenal glands normal appearance. Marked
BILATERAL renal cortical atrophy. Small BILATERAL renal cysts. No
hydronephrosis, ureteral dilatation or definite urinary tract
calcification. Peritoneal dialysis catheter present.

Stomach/Bowel: Normal appendix. Stomach and bowel loops normal
appearance.

Vascular/Lymphatic: Scattered atherosclerotic calcifications aorta
and iliac arteries. Aorta normal caliber. No adenopathy.

Reproductive: Unremarkable prostate gland and seminal vesicles

Other: Scattered free fluid consistent with peritoneal dialysis. No
definite free air. Small umbilical hernia containing fat as well as
a small focus of air, which could be related to the peritoneal
dialysis catheter. LEFT flank hernia containing ascites.

Musculoskeletal: Bones demineralized. Orthopedic hardware at LEFT
acetabulum across nonunion of a old LEFT acetabular fracture.
Osteolysis of LEFT femoral head with fluid in the LEFT hip joint and
superior subluxation of the proximal LEFT femur. Large RIGHT
iliopsoas hematoma largest component measuring 10.0 x 7.6 cm in
greatest axial dimensions and is 67 and extending for approximately
20 cm. Scattered subcutaneous edema at the flanks.
IMPRESSION: Large RIGHT iliopsoas hematoma 10.0 x 7.6 in axial dimensions and
extending 20 cm length.

Nonunion of previously identified LEFT acetabular fracture post
repair.

Osteolysis of LEFT femoral head with fluid in the LEFT hip joint and
superior subluxation of the proximal LEFT femur.

Small umbilical hernia containing fat as well as a small focus of
air, which could be related to the peritoneal dialysis catheter.

Bibasilar atelectasis and small pleural effusions greater on LEFT.

Marked BILATERAL renal cortical atrophy with small BILATERAL renal
cysts consistent with end-stage renal disease.

LEFT flank hernia containing ascites.

Aortic Atherosclerosis ([24]-[24]).

Findings called to Dr. PELE on [DATE] at [24] hours.

## 2020-03-21 MED ORDER — ONDANSETRON HCL 4 MG/2ML IJ SOLN
INTRAMUSCULAR | Status: AC
  Filled 2020-03-21: qty 6

## 2020-03-21 MED ORDER — CEFAZOLIN SODIUM-DEXTROSE 1-4 GM/50ML-% IV SOLN
1.0000 g | INTRAVENOUS | Status: DC
  Filled 2020-03-21: qty 50

## 2020-03-21 MED ORDER — CEPHALEXIN 250 MG PO CAPS: 250.0000 mg | ORAL_CAPSULE | Freq: Two times a day (BID) | ORAL | Status: DC

## 2020-03-21 MED ORDER — PROPOFOL 1000 MG/100ML IV EMUL
INTRAVENOUS | Status: AC
  Filled 2020-03-21: qty 100

## 2020-03-21 MED ORDER — EPHEDRINE 5 MG/ML INJ
INTRAVENOUS | Status: AC
  Filled 2020-03-21: qty 20

## 2020-03-21 MED ORDER — HEPARIN (PORCINE) 25000 UT/250ML-% IV SOLN
2600.0000 [IU]/h | INTRAVENOUS | Status: AC
  Administered 2020-03-21: 2600 [IU]/h via INTRAVENOUS
  Filled 2020-03-21 (×2): qty 250

## 2020-03-21 MED ORDER — HEPARIN SODIUM (PORCINE) 1000 UNIT/ML IJ SOLN
INTRAMUSCULAR | Status: AC
  Filled 2020-03-21: qty 4

## 2020-03-21 MED ORDER — CHLORHEXIDINE GLUCONATE CLOTH 2 % EX PADS
6.0000 | MEDICATED_PAD | Freq: Every day | CUTANEOUS | Status: DC
  Administered 2020-03-22 – 2020-03-26 (×3): 6 via TOPICAL

## 2020-03-21 MED ORDER — PHENYLEPHRINE 40 MCG/ML (10ML) SYRINGE FOR IV PUSH (FOR BLOOD PRESSURE SUPPORT)
PREFILLED_SYRINGE | INTRAVENOUS | Status: AC
  Filled 2020-03-21: qty 20

## 2020-03-21 MED ORDER — SUCCINYLCHOLINE CHLORIDE 200 MG/10ML IV SOSY
PREFILLED_SYRINGE | INTRAVENOUS | Status: AC
  Filled 2020-03-21: qty 30

## 2020-03-21 MED ORDER — DARBEPOETIN ALFA 100 MCG/0.5ML IJ SOSY
100.0000 ug | PREFILLED_SYRINGE | Freq: Once | INTRAMUSCULAR | Status: AC
  Filled 2020-03-21: qty 0.5

## 2020-03-21 MED ORDER — IOHEXOL 300 MG/ML  SOLN
100.0000 mL | Freq: Once | INTRAMUSCULAR | Status: AC | PRN
  Administered 2020-03-21: 100 mL via INTRAVENOUS

## 2020-03-21 MED ORDER — DEXAMETHASONE SODIUM PHOSPHATE 10 MG/ML IJ SOLN
INTRAMUSCULAR | Status: AC
  Filled 2020-03-21: qty 3

## 2020-03-21 MED ORDER — CEFAZOLIN SODIUM-DEXTROSE 1-4 GM/50ML-% IV SOLN
1.0000 g | INTRAVENOUS | Status: AC
  Administered 2020-03-22: 2 g via INTRAVENOUS
  Administered 2020-03-22: 1 g via INTRAVENOUS
  Filled 2020-03-21 (×2): qty 50

## 2020-03-21 MED ORDER — CEPHALEXIN 250 MG PO CAPS
250.0000 mg | ORAL_CAPSULE | Freq: Two times a day (BID) | ORAL | Status: DC
  Administered 2020-03-21: 250 mg via ORAL
  Filled 2020-03-21 (×2): qty 1

## 2020-03-21 MED ORDER — CEPHALEXIN 250 MG PO CAPS
250.0000 mg | ORAL_CAPSULE | Freq: Two times a day (BID) | ORAL | Status: DC
Start: 2020-03-21 — End: 2020-03-21
  Filled 2020-03-21 (×2): qty 1

## 2020-03-21 MED ORDER — LIDOCAINE 2% (20 MG/ML) 5 ML SYRINGE
INTRAMUSCULAR | Status: AC
  Filled 2020-03-21: qty 15

## 2020-03-21 MED ORDER — ROCURONIUM BROMIDE 10 MG/ML (PF) SYRINGE
PREFILLED_SYRINGE | INTRAVENOUS | Status: AC
  Filled 2020-03-21: qty 10

## 2020-03-21 MED ORDER — DARBEPOETIN ALFA 100 MCG/0.5ML IJ SOSY
PREFILLED_SYRINGE | INTRAMUSCULAR | Status: AC
  Administered 2020-03-21: 100 ug via SUBCUTANEOUS
  Filled 2020-03-21: qty 0.5

## 2020-03-21 NOTE — Progress Notes (Signed)
Patient ID: Max Chute., male   DOB: [birth year], 45 y.o.   MRN: [id number] [facility] Progress Note   Assessment/ Plan:   1.  Acute hypoxic respiratory failure/ cardiac arrest: due to severe volume overload/pulmonary edema causing asystolic cardiac arrest. Resolved w/ CRRT.  2. ESRD: severe anasarca / vol overload on admission. Treated w/ CRRT x 6 days with net negative UF of 31 L. Wt dropped 158 to 122 kg. Suspected membrane failure, cannot do PD anymore. Per pt's renal MD will transition to hemodialysis. IR placed [facility] yesterday 2/23. PD cath removal today per gen surgery. Will plan HD tomorrow. Off PD now. Will arrange for outpatient HD to be set up.  3. Anemia ckd: Hb in 8- 9 range, no esa given here > plan darbe 100 ug one dose SQ today.  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 Nutrition: On renal diet  6. Hypertension: no gross vol ^ on exam. Cont BP meds.   7. H/o VTE - on IV Heparin. Okay to resume coumadin from renal standpoint after PD cath removal procedure. Will defer timing to primary MD.  8. ^WBC - blood cx's ordered neg x 2. WBC down. IV Ancef started per pmd. PD fluid is not infected (very low cell count).    [name], MD 03/21/2020, 11:46 AM    Subjective:   TDC placed yest per IR, appreciate help.  Going to OR today for PD cath removal.     Objective:   BP (!) 145/84 (BP Location: Right Arm)   Pulse 92   Temp 98 F (36.7 C) (Oral)   Resp 17   Ht '5\' 9"'$  (1.753 m)   Wt 123.5 kg   SpO2 96%   BMI 40.21 kg/m   Physical Exam: Gen: Resting comfortably in bed CVS: Pulse regular rhythm, normal rate, S1 and S2 normal.   Resp:  no rales bilaterally Abd: Soft, obese, nontender, PD catheter in situ Ext: no LE or UE edema  Thrombosed LUA AVF   R IJ temp HD cath    Labs: BMET Recent Labs  Lab 03/15/20 0605 03/16/20 0210 03/17/20 0217 03/18/20 0128 03/19/20 0633 03/20/20 0044  NA 137 137 136 135 132* 133*  K  4.0 3.9 4.3 3.5 4.1 3.7  CL 98 101 99 96* 96* 96*  CO2 '24 22 22 23 23 22  '$ GLUCOSE 156* 249* 250* 257* 154* 156*  BUN 34* 38* 39* 46* 56* 59*  CREATININE 5.58* 6.37* 7.26* 7.91* 8.69* 9.24*  CALCIUM 10.3 9.7 9.9 9.5 9.4 9.1  PHOS 4.3 4.2 5.1* 5.4* 5.6*  --    CBC Recent Labs  Lab 03/18/20 0128 03/19/20 0633 03/20/20 0044 03/21/20 0108  WBC 14.8* 20.1* 16.6* 13.0*  NEUTROABS  --   --   --  8.9*  HGB 8.7* 8.8* 8.0* 7.7*  HCT 27.0* 27.3* 24.8* 24.7*  MCV 90.3 88.3 88.6 89.8  PLT 373 446* 442* 457*     Medications:    . (feeding supplement) PROSource Plus  30 mL Oral BID WC  . amLODipine  5 mg Oral Daily  . aspirin  81 mg Oral Daily  . atorvastatin  40 mg Oral q1800  . buPROPion  150 mg Oral BID WC  . cephALEXin  250 mg Oral Q12H  . Chlorhexidine Gluconate Cloth  6 each Topical Daily  . docusate sodium  100 mg Oral BID  . feeding supplement  237 mL Oral BID BM  .  gentamicin cream  1 application Topical Daily  . insulin aspart  0-20 Units Subcutaneous Q4H  . lamoTRIgine  100 mg Oral BID  . levETIRAcetam  500 mg Oral Daily  . mouth rinse  15 mL Mouth Rinse BID  . melatonin  5 mg Oral QHS  . metoprolol tartrate  12.5 mg Oral BID  . multivitamin  1 tablet Oral QHS  . pantoprazole  40 mg Oral Daily  . polyethylene glycol  17 g Oral Daily  . sodium chloride flush  10-40 mL Intracatheter Q12H

## 2020-03-21 NOTE — Progress Notes (Signed)
[facility] Summary Report / Loop Recorder 

## 2020-03-21 NOTE — Progress Notes (Addendum)
[facility] for IV heparin>>warfarin Indication: Hx PE, Chronic DVT  Labs: Recent Labs    03/18/20 2059 03/19/20 0633 03/19/20 0633 03/20/20 0044 03/21/20 0108  HGB  --  8.8*   < > 8.0* 7.7*  HCT  --  27.3*  --  24.8* 24.7*  PLT  --  446*  --  442* 457*  LABPROT  --  15.6*  --  15.6* 16.5*  INR  --  1.3*  --  1.3* 1.4*  HEPARINUNFRC <0.10* 0.44  --  0.27*  --   CREATININE  --  8.69*  --  9.24*  --    < > = values in this interval not displayed.   Assessment: 31 yoM on warfarin PTA for hx PE and chronic DVTs admitted s/p cardiac arrest. Warfarin held, heparin started 2/12 with INR <2.  Pt s/p tunneled HD line 2/23 by IR, heparin restarted post/op but then held this morning for planned PD cath removal. Procedure delayed until 2/25 so heparin to resume with planned stop time of 0300 on 2/25 am.  Goal of Therapy:  INR goal 2-3 Heparin level 0.3-0.7 units/ml Monitor platelets by anticoagulation protocol: Yes   Plan:  -Restart heparin 2600 units/h no bolus -Will check heparin level tonight since it has been on and off for procedures -Heparin to stop at 0300 2/25 for PD cath removal   Arrie Senate, PharmD, BCPS, [facility] Clinical Pharmacist [phone] Please check AMION for all [place] numbers 03/21/2020

## 2020-03-21 NOTE — Plan of Care (Signed)
  Problem: Clinical Measurements: Goal: Ability to maintain clinical measurements within normal limits will improve Outcome: Progressing   Problem: Safety: Goal: Ability to remain free from injury will improve Outcome: Progressing   Problem: Education: Goal: Knowledge of disease and its progression will improve Outcome: Progressing Goal: Individualized Educational Video(s) Outcome: Progressing   Problem: Fluid Volume: Goal: Compliance with measures to maintain balanced fluid volume will improve Outcome: Progressing   Problem: Health Behavior/Discharge Planning: Goal: Ability to manage health-related needs will improve Outcome: Progressing   Problem: Nutritional: Goal: Ability to make healthy dietary choices will improve Outcome: Progressing   Problem: Clinical Measurements: Goal: Complications related to the disease process, condition or treatment will be avoided or minimized Outcome: Progressing   Problem: Cardiac: Goal: Ability to achieve and maintain adequate cardiopulmonary perfusion will improve Outcome: Progressing Goal: Vascular access site(s) Level 0-1 will be maintained Outcome: Progressing   Problem: Fluid Volume: Goal: Ability to achieve a balanced intake and output will improve Outcome: Progressing   Problem: Physical Regulation: Goal: Complications related to the disease process, condition or treatment will be avoided or minimized Outcome: Progressing   Problem: Respiratory: Goal: Will regain and/or maintain adequate ventilation Outcome: Progressing   Problem: Safety: Goal: Non-violent Restraint(s) Outcome: Progressing

## 2020-03-21 NOTE — Progress Notes (Signed)
[facility] PROGRESS NOTE    Progress Note  Max Pittman.  [id number] DOB: Oct 24, 1975 DOA: 03/07/2020 PCP: Chesley Noon, MD     Brief Narrative:   Max Pittman. is an 45 y.o. male past medical history of end-stage renal disease on peritoneal dialysis, diabetes mellitus type 2, essential hypertension, DVT on Coumadin sleep apnea comes into the hospital for shortness of breath on arrival to the ED had a cardiac arrest with 12 minutes of CPR before ROSC admitted to the ICU and intubated on 03/07/2020 self extubated on 01/08/2021 transferred to the hospitalist service on 01/11/2021, now requiring CRRT in the ICU.  Was transitioned to hemodialysis, nephrology and IR working on removing his temporary catheter and placing tunneled catheter for permanent dialysis and removing her peritoneal dialysis catheter.   Assessment/Plan:   Acute respiratory failure with hypoxia (HCC) leading to cardiac arrest: In the setting of fluid overload. He has been weaned to room air. Physical therapy evaluated the patient recommended skilled nursing facility placement.  End-stage renal disease on peritoneal dialysis/hyperkalemia/hypomagnesemia: Nephrology was consulted and CRRT was started in the ICU temporary catheter was placed. Tunneled catheter placed on 03/20/2020 by IR. Post HD on 03/20/2020.  For peritoneal catheter removal on 03/21/2020 Further management per renal.  Leucocytosis: White blood cell count continues to improve as well as platelet counts. He was initially treated for right lower lobe infiltrate concern about aspiration he completed his course of IV Unasyn. Pending CT scan of the abdomen and pelvis to rule out abscess or hematoma. 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 He was hospitalized  at that time and completed 6 weeks of IV antibiotics now on suppressive therapy with Keflex. Blood cultures and fluid cultures were drawn on 03/19/2018 have remained negative till date.  Cardiogenic shock: Now resolved.  Obstructive sleep apnea: Noncompliant with CPAP.  Acute metabolic encephalopathy: Probably due to respiratory distress.  Deconditioning: Physical therapy evaluated the patient recommended skilled nursing facility continue to work with physical therapy.   DVT prophylaxis: lovenox Family Communication:none Status is: Inpatient  Remains inpatient appropriate because:Hemodynamically unstable   Dispo: The patient is from: Home              Anticipated d/c is to: SNF              Anticipated d/c date is: 1days              Patient currently is not medically stable to d/c.   Difficult to place patient No        Code Status:     Code Status Orders  (From admission, onward)         Start     Ordered   03/07/20 0440  Full code  Continuous        03/07/20 0441        Code Status History    Date Active Date Inactive Code Status Order ID Comments User Context   07/13/2019 2250 07/28/2019 2220 Full Code [id number]  Bethena Roys, MD Inpatient   07/01/2019 0254 07/06/2019 2206 Full Code [id number]  Edmonia Lynch, DO ED   06/02/2019 1610 06/17/2019 1520 Full Code [id number]  Elizabeth Sauer Inpatient   06/02/2019 1610 06/02/2019 1610 Full Code [id number]  Cathlyn Parsons,  PA-C Inpatient   05/21/2019 0025 06/02/2019 1557 Full Code [id number]  Danne Baxter Inpatient   03/22/2019 [id number] 03/24/2019 1612 Full Code [id number]  Bethena Roys, MD Inpatient   Advance Care Planning Activity        IV Access:    Peripheral IV   Procedures and diagnostic studies:   MR LUMBAR SPINE WO CONTRAST  Result Date: 03/19/2020 CLINICAL DATA:  Low back pain with possible infection. EXAM: MRI LUMBAR SPINE WITHOUT CONTRAST TECHNIQUE: Multiplanar,  multisequence MR imaging of the lumbar spine was performed. No intravenous contrast was administered. COMPARISON:  None. FINDINGS: Segmentation:  Standard. Alignment:  Physiologic. Vertebrae:  No fracture, evidence of discitis, or bone lesion. Conus medullaris and cauda equina: Conus extends to the L1 level. Conus and cauda equina appear normal. Paraspinal and other soft tissues: There is an incompletely visualized mixed signal intensity collection anterior to the right iliac bone that measures at least 5.4 cm. Disc levels: The disc spaces above L3 are normal. L3-4: Intermediate disc bulge with mild spinal canal stenosis. No foraminal stenosis. L4-5: Small disc bulge without stenosis. L5-S1: No disc herniation or stenosis.  Moderate facet hypertrophy. IMPRESSION: 1. Incompletely visualized retroperitoneal collection anterior to the right iliac. This may be an abscess or hematoma. CT of the abdomen and pelvis with contrast is recommended. 2. Mild lumbar degenerative disc disease without stenosis. Electronically Signed   By: Ulyses Jarred M.D.   On: 03/19/2020 19:44   IR US Guide Vasc Access Right  Result Date: 03/20/2020 INDICATION: End-stage renal disease EXAM: TUNNELED CENTRAL VENOUS HEMODIALYSIS CATHETER PLACEMENT WITH ULTRASOUND AND FLUOROSCOPIC GUIDANCE MEDICATIONS: Ancef 2 g IV. The antibiotic was given in an appropriate time interval prior to skin puncture. ANESTHESIA/SEDATION: Moderate (conscious) sedation was employed during this procedure. A total of Versed 2 mg and Fentanyl 100 mcg was administered intravenously. Moderate Sedation Time: 10 minutes. The patient's level of consciousness and vital signs were monitored continuously by radiology nursing throughout the procedure under my direct supervision. FLUOROSCOPY TIME:  Fluoroscopy Time: 0 minutes 48 seconds (8 mGy). COMPLICATIONS: None immediate. PROCEDURE: Informed written consent was obtained from the patient after a discussion of the risks,  benefits, and alternatives to treatment. Questions regarding the procedure were encouraged and answered. The right neck and chest were prepped with chlorhexidine in a sterile fashion, and a sterile drape was applied covering the operative field. Maximum barrier sterile technique with sterile gowns and gloves were used for the procedure. A timeout was performed prior to the initiation of the procedure. The right internal jugular vein was evaluated with ultrasound and shown to be patent. A permanent ultrasound image was obtained and placed in the patient's medical record. Using sterile gel and a sterile probe cover, the right internal jugular vein was entered with a 21 ga needle during real time ultrasound guidance. 0.018 inch guidewire placed and 21 ga needle exchanged for transitional dilator set. Utilizing fluoroscopy, 0.035 inch guidewire advanced through the needle without difficulty. Seriel dilation was performed and peel-away sheath was placed. Attention then turned to the right anterior upper chest. Following local lidocaine administration, the hemodialysis catheter was tunneled from the chest wall to the venotomy site. The catheter was inserted through the peel-away sheath. The tip of the catheter was positioned within the right atrium using fluoroscopic guidance. All lumens of the catheter aspirated and flushed well. The dialysis lumens were locked with Heparin. The catheter was secured to the skin with suture. The insertion site was covered with a  Biopatch and sterile dressing. Temporary right non tunneled hemodialysis catheter was removed without difficulty and hemostasis achieved with manual compression. IMPRESSION: Successful placement of 23 cm tip to cuff tunneled hemodialysis catheter via the right internal jugular vein with tips terminating within the superior aspect of the right atrium. The catheter is ready for immediate use. Electronically Signed   By: Miachel Roux M.D.   On: 03/20/2020 15:10    IR TUNNELED CENTRAL VENOUS CATHETER PLACEMENT  Result Date: 03/20/2020 INDICATION: End-stage renal disease EXAM: TUNNELED CENTRAL VENOUS HEMODIALYSIS CATHETER PLACEMENT WITH ULTRASOUND AND FLUOROSCOPIC GUIDANCE MEDICATIONS: Ancef 2 g IV. The antibiotic was given in an appropriate time interval prior to skin puncture. ANESTHESIA/SEDATION: Moderate (conscious) sedation was employed during this procedure. A total of Versed 2 mg and Fentanyl 100 mcg was administered intravenously. Moderate Sedation Time: 10 minutes. The patient's level of consciousness and vital signs were monitored continuously by radiology nursing throughout the procedure under my direct supervision. FLUOROSCOPY TIME:  Fluoroscopy Time: 0 minutes 48 seconds (8 mGy). COMPLICATIONS: None immediate. PROCEDURE: Informed written consent was obtained from the patient after a discussion of the risks, benefits, and alternatives to treatment. Questions regarding the procedure were encouraged and answered. The right neck and chest were prepped with chlorhexidine in a sterile fashion, and a sterile drape was applied covering the operative field. Maximum barrier sterile technique with sterile gowns and gloves were used for the procedure. A timeout was performed prior to the initiation of the procedure. The right internal jugular vein was evaluated with ultrasound and shown to be patent. A permanent ultrasound image was obtained and placed in the patient's medical record. Using sterile gel and a sterile probe cover, the right internal jugular vein was entered with a 21 ga needle during real time ultrasound guidance. 0.018 inch guidewire placed and 21 ga needle exchanged for transitional dilator set. Utilizing fluoroscopy, 0.035 inch guidewire advanced through the needle without difficulty. Seriel dilation was performed and peel-away sheath was placed. Attention then turned to the right anterior upper chest. Following local lidocaine administration, the  hemodialysis catheter was tunneled from the chest wall to the venotomy site. The catheter was inserted through the peel-away sheath. The tip of the catheter was positioned within the right atrium using fluoroscopic guidance. All lumens of the catheter aspirated and flushed well. The dialysis lumens were locked with Heparin. The catheter was secured to the skin with suture. The insertion site was covered with a Biopatch and sterile dressing. Temporary right non tunneled hemodialysis catheter was removed without difficulty and hemostasis achieved with manual compression. IMPRESSION: Successful placement of 23 cm tip to cuff tunneled hemodialysis catheter via the right internal jugular vein with tips terminating within the superior aspect of the right atrium. The catheter is ready for immediate use. Electronically Signed   By: Miachel Roux M.D.   On: 03/20/2020 15:10     Medical Consultants:    None.   Subjective:    Max Pittman. no complaints.  Objective:    Vitals:   03/20/20 2323 03/21/20 0407 03/21/20 0745 03/21/20 0800  BP: 132/75 136/87 134/77 (!) 145/84  Pulse: 91 95 89 92  Resp: '20 18 20 17  '$ Temp: 98.3 F (36.8 C) 97.6 F (36.4 C) (!) 97.4 F (36.3 C) 98 F (36.7 C)  TempSrc: Oral Oral Oral Oral  SpO2: 97% 96% 94% 96%  Weight:  122.7 kg  123.5 kg  Height:       SpO2: 96 % O2 Flow  Rate (L/min): 2 L/min FiO2 (%): 97 %   Intake/Output Summary (Last 24 hours) at 03/21/2020 0954 Last data filed at 03/21/2020 0300 Gross per 24 hour  Intake 419.98 ml  Output 300 ml  Net 119.98 ml   Filed Weights   03/20/20 0633 03/21/20 0407 03/21/20 0800  Weight: 122.7 kg 122.7 kg 123.5 kg    Exam: General exam: In no acute distress. Respiratory system: Good air movement and clear to auscultation. Cardiovascular system: S1 & S2 heard, RRR. No JVD. Gastrointestinal system: Abdomen is nondistended, soft and nontender.  Peritoneal catheter in place Extremities: No pedal  edema. Skin: No rashes, lesions or ulcers Psychiatry: Judgement and insight appear normal. Mood & affect appropriate.   Data Reviewed:    Labs: Basic Metabolic Panel: Recent Labs  Lab 03/15/20 0605 03/16/20 0210 03/17/20 0217 03/18/20 0128 03/19/20 0633 03/20/20 0044  NA 137 137 136 135 132* 133*  K 4.0 3.9 4.3 3.5 4.1 3.7  CL 98 101 99 96* 96* 96*  CO2 '24 22 22 23 23 22  '$ GLUCOSE 156* 249* 250* 257* 154* 156*  BUN 34* 38* 39* 46* 56* 59*  CREATININE 5.58* 6.37* 7.26* 7.91* 8.69* 9.24*  CALCIUM 10.3 9.7 9.9 9.5 9.4 9.1  MG 2.7* 2.5* 2.6* 2.3 2.4  --   PHOS 4.3 4.2 5.1* 5.4* 5.6*  --    GFR Estimated Creatinine Clearance: 13.4 mL/min (A) (by C-G formula based on SCr of 9.24 mg/dL (H)). Liver Function Tests: Recent Labs  Lab 03/15/20 0605 03/16/20 0210 03/17/20 0217 03/18/20 0128 03/19/20 0633 03/20/20 0044  AST 28 25  --   --   --  40  ALT 30 24  --   --   --  15  ALKPHOS 110 91  --   --   --  76  BILITOT 0.5 0.6  --   --   --  0.5  PROT 8.5* 7.5  --   --   --  6.7  ALBUMIN 2.9* 2.7* 2.9* 2.5* 2.5* 2.2*   No results for input(s): LIPASE, AMYLASE in the last 168 hours. No results for input(s): AMMONIA in the last 168 hours. Coagulation profile Recent Labs  Lab 03/17/20 0210 03/18/20 0128 03/19/20 0633 03/20/20 0044 03/21/20 0108  INR 1.3* 1.4* 1.3* 1.3* 1.4*   COVID-19 Labs  No results for input(s): DDIMER, FERRITIN, LDH, CRP in the last 72 hours.  Lab Results  Component Value Date   SARSCOV2NAA NEGATIVE 03/07/2020   [place] NEGATIVE 07/13/2019   [place] NEGATIVE 06/30/2019   [place] NEGATIVE 05/20/2019    CBC: Recent Labs  Lab 03/17/20 0210 03/18/20 0128 03/19/20 0633 03/20/20 0044 03/21/20 0108  WBC 14.4* 14.8* 20.1* 16.6* 13.0*  NEUTROABS  --   --   --   --  8.9*  HGB 9.5* 8.7* 8.8* 8.0* 7.7*  HCT 30.5* 27.0* 27.3* 24.8* 24.7*  MCV 90.8 90.3 88.3 88.6 89.8  PLT 348 373 446* 442* 457*   Cardiac Enzymes: No results for  input(s): CKTOTAL, CKMB, CKMBINDEX, TROPONINI in the last 168 hours. BNP (last 3 results) No results for input(s): PROBNP in the last 8760 hours. CBG: Recent Labs  Lab 03/20/20 1642 03/20/20 2010 03/20/20 2327 03/21/20 0415 03/21/20 0748  GLUCAP 122* 140* 90 148* 122*   D-Dimer: No results for input(s): DDIMER in the last 72 hours. Hgb A1c: No results for input(s): HGBA1C in the last 72 hours. Lipid Profile: No results for input(s): CHOL, HDL, LDLCALC, TRIG, CHOLHDL, LDLDIRECT in  the last 72 hours. Thyroid function studies: No results for input(s): TSH, T4TOTAL, T3FREE, THYROIDAB in the last 72 hours.  Invalid input(s): FREET3 Anemia work up: No results for input(s): VITAMINB12, FOLATE, FERRITIN, TIBC, IRON, RETICCTPCT in the last 72 hours. Sepsis Labs: Recent Labs  Lab 03/18/20 0128 03/19/20 [id number] 03/20/20 0044 03/21/20 0108  WBC 14.8* 20.1* 16.6* 13.0*   Microbiology Recent Results (from the past 240 hour(s))  Culture, blood (Routine X 2) w Reflex to ID Panel     Status: None (Preliminary result)   Collection Time: 03/19/20 10:30 AM   Specimen: BLOOD RIGHT HAND  Result Value Ref Range Status   Specimen Description BLOOD RIGHT HAND  Final   Special Requests   Final    BOTTLES DRAWN AEROBIC ONLY Blood Culture results may not be optimal due to an inadequate volume of blood received in culture bottles   Culture   Final    NO GROWTH 2 DAYS Performed at [facility], [place] [address], [place], [place] [address]    Report Status PENDING  Incomplete  Culture, blood (Routine X 2) w Reflex to ID Panel     Status: None (Preliminary result)   Collection Time: 03/19/20 10:41 AM   Specimen: BLOOD RIGHT WRIST  Result Value Ref Range Status   Specimen Description BLOOD RIGHT WRIST  Final   Special Requests   Final    BOTTLES DRAWN AEROBIC AND ANAEROBIC Blood Culture adequate volume   Culture   Final    NO GROWTH 2 DAYS Performed at [facility], [place] [address], [place], [place] [address]    Report Status PENDING  Incomplete  Body fluid culture w Gram Stain     Status: None (Preliminary result)   Collection Time: 03/20/20  8:12 AM   Specimen: Peritoneal Dialysate; Body Fluid  Result Value Ref Range Status   Specimen Description PERITONEAL DIALYSATE  Final   Special Requests Immunocompromised  Final   Gram Stain   Final    RARE WBC PRESENT, PREDOMINANTLY MONONUCLEAR NO ORGANISMS SEEN    Culture   Final    NO GROWTH < 24 HOURS Performed at [facility], [address], [place], [place] [address]    Report Status PENDING  Incomplete     Medications:   . (feeding supplement) PROSource Plus  30 mL Oral BID WC  . amLODipine  5 mg Oral Daily  . aspirin  81 mg Oral Daily  . atorvastatin  40 mg Oral q1800  . buPROPion  150 mg Oral BID WC  . [START ON 03/22/2020] cephALEXin  250 mg Oral Q12H  . Chlorhexidine Gluconate Cloth  6 each Topical Daily  . docusate sodium  100 mg Oral BID  . feeding supplement  237 mL Oral BID BM  . gentamicin cream  1 application Topical Daily  . insulin aspart  0-20 Units Subcutaneous Q4H  . lamoTRIgine  100 mg Oral BID  . levETIRAcetam  500 mg Oral Daily  . mouth rinse  15 mL Mouth Rinse BID  . melatonin  5 mg Oral QHS  . metoprolol tartrate  12.5 mg Oral BID  . multivitamin  1 tablet Oral QHS  . pantoprazole  40 mg Oral Daily  . polyethylene glycol  17 g Oral Daily  . sodium chloride flush  10-40 mL Intracatheter Q12H   Continuous Infusions: .  ceFAZolin (ANCEF) IV    . dialysis solution 1.5% low-MG/low-CA    . dialysis  solution 2.5% low-MG/low-CA        LOS: 14 days   Charlynne Cousins  [facility]  03/21/2020, 9:54 AM

## 2020-03-21 NOTE — Progress Notes (Signed)
OT Cancellation Note  Patient Details Name: Max Pittman. MRN: [id number] DOB: Dec 28, 1975   Cancelled Treatment:    Reason Eval/Treat Not Completed: Patient at procedure or test/ unavailable.  OT to continue efforts as appropriate.  [name] [name] 03/21/2020, 4:16 PM

## 2020-03-21 NOTE — Progress Notes (Signed)
Right IJ HD cath had some old drainage, this am gauze was wet with bloody drainage. Dressing changed using sterile technique, new bio-patch applied, covered with gauze and clear dressing. Pt tolerated well. Will continue to monitor.

## 2020-03-21 NOTE — Progress Notes (Signed)
Pre Procedure note for inpatients:   Max Pittman. has been scheduled for Procedure(s) with comments: REMOVAL OF PERITONEAL DIALYSIS CATHETER today. The various methods of treatment have been discussed with the patient. After consideration of the risks, benefits and treatment options the patient has consented to the planned procedure.   The patient has been seen and labs reviewed. There are no changes in the patient's condition to prevent proceeding with the planned procedure today.  Recent labs:  Lab Results  Component Value Date   WBC 13.0 (H) 03/21/2020   HGB 7.7 (L) 03/21/2020   HCT 24.7 (L) 03/21/2020   PLT 457 (H) 03/21/2020   GLUCOSE 156 (H) 03/20/2020   CHOL 82 05/22/2019   TRIG 208 (H) 03/11/2020   HDL 25 (L) 05/22/2019   LDLCALC 34 05/22/2019   ALT 15 03/20/2020   AST 40 03/20/2020   NA 133 (L) 03/20/2020   K 3.7 03/20/2020   CL 96 (L) 03/20/2020   CREATININE 9.24 (H) 03/20/2020   BUN 59 (H) 03/20/2020   CO2 22 03/20/2020   INR 1.4 (H) 03/21/2020   HGBA1C 5.6 03/07/2020    Mickeal Skinner, MD 03/21/2020 7:59 AM

## 2020-03-21 NOTE — Progress Notes (Signed)
PHARMACY NOTE:  ANTIMICROBIAL RENAL DOSAGE ADJUSTMENT  Current antimicrobial regimen includes a mismatch between antimicrobial dosage and estimated renal function.  As per policy approved by the Pharmacy & Therapeutics and Medical Executive Committees, the antimicrobial dosage will be adjusted accordingly.  Current antimicrobial dosage:  Cefazolin 2g IV x1 preop  Indication: surgical ppx  Renal Function:  Estimated Creatinine Clearance: 13.4 mL/min (A) (by C-G formula based on SCr of 9.24 mg/dL (H)). '[]'$      On intermittent HD, scheduled: peritoneal dialysis '[]'$      On CRRT    Antimicrobial dosage has been changed to:  Cefazolin 1g IV x1 preop, hold cephalexin today (duplicate coverage)  Additional comments:   Thank you for allowing pharmacy to be a part of this patient's care.  Arrie Senate, PharmD, BCPS, [facility] Clinical Pharmacist [phone] Please check AMION for all [place] numbers 03/21/2020

## 2020-03-21 NOTE — Progress Notes (Signed)
Physical Therapy Treatment Patient Details Name: Max Pittman. MRN: [id number] DOB: 1975-03-17 Today's Date: 03/21/2020    History of Present Illness Max Pittman. is a 45 y.o. male who during early AM hours 2/10,  had worsening dyspnea at home.  Had apparently been experiencing dyspnea and trying to manage it with home peritoneal dialysis.  EMS was called and he initially refused transport to ED; however, after their arrival, he had a cardiac arrest.  Initial rhythm was asystole and he required 12 minutes before ROSC. Pt intubated 2/10, self extubated at 2/14 at midnight.  Also on CRRT.  PMH: including but not limited to ESRD on PD, DM, NSTEMI, HTN, DVT on warfarin, OSA.    PT Comments    Pt received in supine, agreeable to therapy session and with good participation and tolerance for mobility. Pt anxious regarding prospect of mobility but with improved participation compared with Monday's treatment session, mainly limited due to reported RLE knee/hip soreness/tingling pain. Michaelyn Barter for safety due to hx of fall with RW on Tuesday. Pt with limited pre-gait progression but able to perform bed mobility with up to minA and transfers with +35mnA from elevated bed height. Pt reports seated lateral scooting is "about as painful" as pre-gait standing activity. Deferred OOB transfer due to upcoming CT scan scheduled/awaiting transport. Pt continues to benefit from PT services to progress toward functional mobility goals. Continue to recommend SNF, pending progress, (pt wanting to work toward CIR and will continue to assess DC recs).   Follow Up Recommendations  SNF (if pt refuses SNF, would benefit from HHPT.)     Equipment Recommendations  Rolling walker with 5" wheels;3in1 (PT)    Recommendations for Other Services       Precautions / Restrictions Precautions Precautions: Fall Precaution Comments: had fall 2/22; PD catheter in LLQ (plan to remove later  today) Restrictions Weight Bearing Restrictions: No Other Position/Activity Restrictions:  (R hip/knee pain)    Mobility  Bed Mobility Overal bed mobility: Needs Assistance Bed Mobility: Rolling;Sidelying to Sit;Sit to Sidelying Rolling: Supervision Sidelying to sit: Min guard;HOB elevated     Sit to sidelying: Min assist General bed mobility comments: min cues for log roll technique/sequencing due to previous back injury, increased time to perform but good following of 1-step cues; use of bed rail    Transfers Overall transfer level: Needs assistance   Transfers: Sit to/from Stand Sit to Stand: Min assist;+2 safety/equipment;From elevated surface         General transfer comment: from elevated bed height<>Stedy x1 and stedy seat to standing x2, dependent for lateral scoot to [facility]; unable to step  Ambulation/Gait             General Gait Details: pre-gait marching x3 reps within STangerinebut minimal foot clearance and unable to step safely, deferred gait for safety (R knee pain)   Stairs             Wheelchair Mobility    Modified Rankin (Stroke Patients Only)       Balance Overall balance assessment: Needs assistance Sitting-balance support: Bilateral upper extremity supported;Feet supported Sitting balance-Leahy Scale: Poor Sitting balance - Comments: pt needed minA to min guard when sitting unsupported, with BUE support needs Supervision (pt tending to hold bed rails) Postural control: Posterior lean Standing balance support: Bilateral upper extremity supported Standing balance-Leahy Scale: Poor Standing balance comment: heavy reliance on Stedy and min guard to minA for standing balance (R knee  near-buckle with weight shifting)                            Cognition Arousal/Alertness: Awake/alert Behavior During Therapy: WFL for tasks assessed/performed Overall Cognitive Status: Within Functional Limits for tasks assessed Area  of Impairment: Safety/judgement;Memory;Following commands;Awareness;Problem solving                     Memory: Decreased short-term memory Following Commands: Follows one step commands with increased time;Follows multi-step commands inconsistently Safety/Judgement: Decreased awareness of deficits   Problem Solving: Requires verbal cues;Slow processing General Comments: pt with decreased insight but slightly more awareness today, thinking about rehab (previously didn't want anything except HHPT)      Exercises General Exercises - Lower Extremity Ankle Circles/Pumps: AROM;Both;10 reps;Seated Long Arc Quad: AROM;Both;10 reps;Seated;Limitations (R knee decreased ROM due to pain unable to reach >45 deg ext) Hip Flexion/Marching: AROM;Both;10 reps;Seated Other Exercises Other Exercises: standing tolerance for BLE strengthening in Stedy 2-3 minutes, weight shifting/marching in Stedy x5 reps ea    General Comments General comments (skin integrity, edema, etc.): HR 97-118 bpm during mobility, SpO2 93-99% on RA, RR 19-26 rpm, pt denies dizziness and BP not further assessed (WNL in AM per chart review)      Pertinent Vitals/Pain Pain Assessment: Faces Faces Pain Scale: Hurts even more Pain Location: R knee/hip>L knee Pain Descriptors / Indicators: Grimacing;Guarding;Aching;Tingling (RLE nerve pain/tingling t/o) Pain Intervention(s): Premedicated before session;Monitored during session;Repositioned    Home Living                      Prior Function            PT Goals (current goals can now be found in the care plan section) Acute Rehab PT Goals Patient Stated Goal: to get stronger and maybe rehab before home with [facility] PT Goal Formulation: With patient Time For Goal Achievement: 03/25/20 Potential to Achieve Goals: Fair Progress towards PT goals: Progressing toward goals (limited session due to RLE pain, awaiting transport for CT scan)    Frequency    Min  3X/week      PT Plan Current plan remains appropriate    Co-evaluation              AM-PAC PT "6 Clicks" Mobility   Outcome Measure  Help needed turning from your back to your side while in a flat bed without using bedrails?: A Little Help needed moving from lying on your back to sitting on the side of a flat bed without using bedrails?: A Little Help needed moving to and from a bed to a chair (including a wheelchair)?: A Lot Help needed standing up from a chair using your arms (e.g., wheelchair or bedside chair)?: A Lot Help needed to walk in hospital room?: Total Help needed climbing 3-5 steps with a railing? : Total 6 Click Score: 12    End of Session Equipment Utilized During Treatment: Gait belt Activity Tolerance: Patient tolerated treatment well;Patient limited by pain Patient left: with call bell/phone within reach;in bed;with bed alarm set Nurse Communication: Mobility status;Need for lift equipment PT Visit Diagnosis: Muscle weakness (generalized) (M62.81)     Time: [id number] PT Time Calculation (min) (ACUTE ONLY): 26 min  Charges:  $Therapeutic Exercise: 8-22 mins $Therapeutic Activity: 8-22 mins                     Laneya Gasaway P., PTA Acute Rehabilitation Services  Pager: [phone] Office: [place] 03/21/2020, 12:24 PM

## 2020-03-21 NOTE — Progress Notes (Signed)
[place] for IV heparin>>warfarin Indication: Hx PE, Chronic DVT  Labs: Recent Labs    03/19/20 0633 03/20/20 0044 03/21/20 0108 03/21/20 1203 03/21/20 2138  HGB 8.8* 8.0* 7.7*  --   --   HCT 27.3* 24.8* 24.7*  --   --   PLT 446* 442* 457*  --   --   LABPROT 15.6* 15.6* 16.5*  --   --   INR 1.3* 1.3* 1.4*  --   --   HEPARINUNFRC 0.44 0.27*  --   --  0.59  CREATININE 8.69* 9.24*  --  9.64*  --    Assessment: 77 yoM on warfarin PTA for hx PE and chronic DVTs admitted s/p cardiac arrest. Warfarin held, heparin started 2/12 with INR <2.  Pt s/p tunneled HD line 2/23 by IR, heparin restarted post/op but then held this morning for planned PD cath removal. Procedure delayed until 2/25 so heparin to resume with planned stop time of 0300 on 2/25 am.  Hep lvl therapeutic   Goal of Therapy:  INR goal 2-3 Heparin level 0.3-0.7 units/ml Monitor platelets by anticoagulation protocol: Yes   Plan:  -continue heparin 2600 units/h  -Heparin to stop at 0300 2/25 for PD cath removal  Barth Kirks, PharmD, BCCCP Clinical Pharmacist [phone]  Please check AMION for all [place] numbers  03/21/2020 10:30 PM

## 2020-03-22 ENCOUNTER — Encounter (HOSPITAL_COMMUNITY): Payer: Self-pay | Admitting: Internal Medicine

## 2020-03-22 ENCOUNTER — Inpatient Hospital Stay (HOSPITAL_COMMUNITY): Payer: Medicare Other | Admitting: Certified Registered Nurse Anesthetist

## 2020-03-22 ENCOUNTER — Encounter (HOSPITAL_COMMUNITY)
Admission: EM | Disposition: A | Discharge status: Rehabilitation Facility | Payer: Self-pay | Source: Home / Self Care | Attending: Internal Medicine

## 2020-03-22 DIAGNOSIS — N186 End stage renal disease: Secondary | ICD-10-CM | POA: Diagnosis not present

## 2020-03-22 DIAGNOSIS — D649 Anemia, unspecified: Secondary | ICD-10-CM | POA: Diagnosis not present

## 2020-03-22 DIAGNOSIS — J9601 Acute respiratory failure with hypoxia: Secondary | ICD-10-CM | POA: Diagnosis not present

## 2020-03-22 DIAGNOSIS — N189 Chronic kidney disease, unspecified: Secondary | ICD-10-CM | POA: Diagnosis not present

## 2020-03-22 HISTORY — PX: MINOR REMOVAL OF PERITONEAL DIALYSIS CATHETER: SHX6397

## 2020-03-22 LAB — GLUCOSE, CAPILLARY
Glucose-Capillary: 102 mg/dL — ABNORMAL HIGH (ref 70–99)
Glucose-Capillary: 113 mg/dL — ABNORMAL HIGH (ref 70–99)
Glucose-Capillary: 122 mg/dL — ABNORMAL HIGH (ref 70–99)
Glucose-Capillary: 84 mg/dL (ref 70–99)
Glucose-Capillary: 84 mg/dL (ref 70–99)
Glucose-Capillary: 86 mg/dL (ref 70–99)
Glucose-Capillary: 91 mg/dL (ref 70–99)

## 2020-03-22 LAB — PROTIME-INR
INR: 1.6 — ABNORMAL HIGH (ref 0.8–1.2)
Prothrombin Time: 18 seconds — ABNORMAL HIGH (ref 11.4–15.2)

## 2020-03-22 LAB — CBC
HCT: 22.9 % — ABNORMAL LOW (ref 39.0–52.0)
Hemoglobin: 7.3 g/dL — ABNORMAL LOW (ref 13.0–17.0)
MCH: 28.7 pg (ref 26.0–34.0)
MCHC: 31.9 g/dL (ref 30.0–36.0)
MCV: 90.2 fL (ref 80.0–100.0)
Platelets: 468 10*3/uL — ABNORMAL HIGH (ref 150–400)
RBC: 2.54 MIL/uL — ABNORMAL LOW (ref 4.22–5.81)
RDW: 17.1 % — ABNORMAL HIGH (ref 11.5–15.5)
WBC: 10.9 10*3/uL — ABNORMAL HIGH (ref 4.0–10.5)
nRBC: 0.2 % (ref 0.0–0.2)

## 2020-03-22 LAB — ABO/RH: ABO/RH(D): A POS

## 2020-03-22 LAB — PREPARE RBC (CROSSMATCH)

## 2020-03-22 SURGERY — MINOR REMOVAL OF PERITONEAL DIALYSIS CATHETER
Anesthesia: General

## 2020-03-22 MED ORDER — CEPHALEXIN 250 MG PO CAPS
250.0000 mg | ORAL_CAPSULE | Freq: Two times a day (BID) | ORAL | Status: DC
Start: 2020-03-23 — End: 2020-03-25
  Administered 2020-03-23 – 2020-03-25 (×5): 250 mg via ORAL
  Filled 2020-03-22 (×5): qty 1

## 2020-03-22 MED ORDER — MIDAZOLAM HCL 2 MG/2ML IJ SOLN
INTRAMUSCULAR | Status: AC
  Filled 2020-03-22: qty 2

## 2020-03-22 MED ORDER — ROCURONIUM BROMIDE 100 MG/10ML IV SOLN
INTRAVENOUS | Status: DC | PRN
  Administered 2020-03-22: 30 mg via INTRAVENOUS

## 2020-03-22 MED ORDER — DEXAMETHASONE SODIUM PHOSPHATE 10 MG/ML IJ SOLN
INTRAMUSCULAR | Status: AC
  Filled 2020-03-22: qty 1

## 2020-03-22 MED ORDER — PROPOFOL 10 MG/ML IV BOLUS
INTRAVENOUS | Status: AC
  Filled 2020-03-22: qty 20

## 2020-03-22 MED ORDER — CEFAZOLIN SODIUM 1 G IJ SOLR
INTRAMUSCULAR | Status: AC
  Filled 2020-03-22: qty 40

## 2020-03-22 MED ORDER — ONDANSETRON HCL 4 MG/2ML IJ SOLN
INTRAMUSCULAR | Status: AC
  Filled 2020-03-22: qty 2

## 2020-03-22 MED ORDER — LIDOCAINE 2% (20 MG/ML) 5 ML SYRINGE
INTRAMUSCULAR | Status: DC | PRN
  Administered 2020-03-22: 80 mg via INTRAVENOUS

## 2020-03-22 MED ORDER — FENTANYL CITRATE (PF) 250 MCG/5ML IJ SOLN
INTRAMUSCULAR | Status: DC | PRN
  Administered 2020-03-22 (×5): 50 ug via INTRAVENOUS

## 2020-03-22 MED ORDER — PROPOFOL 10 MG/ML IV BOLUS
INTRAVENOUS | Status: AC
  Filled 2020-03-22: qty 40

## 2020-03-22 MED ORDER — BUPIVACAINE HCL (PF) 0.25 % IJ SOLN
INTRAMUSCULAR | Status: DC | PRN
  Administered 2020-03-22: 10 mL

## 2020-03-22 MED ORDER — SODIUM CHLORIDE (PF) 0.9 % IJ SOLN
INTRAMUSCULAR | Status: AC
  Filled 2020-03-22: qty 10

## 2020-03-22 MED ORDER — PROPOFOL 10 MG/ML IV BOLUS
INTRAVENOUS | Status: DC | PRN
  Administered 2020-03-22: 160 mg via INTRAVENOUS
  Administered 2020-03-22: 40 mg via INTRAVENOUS

## 2020-03-22 MED ORDER — HYDRALAZINE HCL 20 MG/ML IJ SOLN
5.0000 mg | Freq: Once | INTRAMUSCULAR | Status: AC
  Administered 2020-03-22: 5 mg via INTRAVENOUS

## 2020-03-22 MED ORDER — ONDANSETRON HCL 4 MG/2ML IJ SOLN
INTRAMUSCULAR | Status: DC | PRN
  Administered 2020-03-22: 4 mg via INTRAVENOUS

## 2020-03-22 MED ORDER — BUPIVACAINE HCL (PF) 0.25 % IJ SOLN
INTRAMUSCULAR | Status: AC
  Filled 2020-03-22: qty 30

## 2020-03-22 MED ORDER — SUCCINYLCHOLINE CHLORIDE 200 MG/10ML IV SOSY
PREFILLED_SYRINGE | INTRAVENOUS | Status: DC | PRN
  Administered 2020-03-22: 140 mg via INTRAVENOUS

## 2020-03-22 MED ORDER — SUGAMMADEX SODIUM 200 MG/2ML IV SOLN
INTRAVENOUS | Status: DC | PRN
  Administered 2020-03-22: 100 mg via INTRAVENOUS

## 2020-03-22 MED ORDER — ROCURONIUM BROMIDE 10 MG/ML (PF) SYRINGE
PREFILLED_SYRINGE | INTRAVENOUS | Status: AC
  Filled 2020-03-22: qty 10

## 2020-03-22 MED ORDER — 0.9 % SODIUM CHLORIDE (POUR BTL) OPTIME
TOPICAL | Status: DC | PRN
  Administered 2020-03-22: 1000 mL

## 2020-03-22 MED ORDER — ONDANSETRON HCL 4 MG/2ML IJ SOLN
4.0000 mg | Freq: Once | INTRAMUSCULAR | Status: AC | PRN
  Administered 2020-03-22: 4 mg via INTRAVENOUS

## 2020-03-22 MED ORDER — FENTANYL CITRATE (PF) 250 MCG/5ML IJ SOLN
INTRAMUSCULAR | Status: AC
  Filled 2020-03-22: qty 5

## 2020-03-22 MED ORDER — SODIUM CHLORIDE 0.9% IV SOLUTION
Freq: Once | INTRAVENOUS | Status: AC
  Administered 2020-03-22: 300 mL/h via INTRAVENOUS

## 2020-03-22 MED ORDER — FENTANYL CITRATE (PF) 100 MCG/2ML IJ SOLN: 25.0000 ug | INTRAMUSCULAR | Status: DC | PRN

## 2020-03-22 MED ORDER — HYDRALAZINE HCL 20 MG/ML IJ SOLN
INTRAMUSCULAR | Status: AC
  Filled 2020-03-22: qty 1

## 2020-03-22 MED ORDER — CHLORHEXIDINE GLUCONATE 0.12 % MT SOLN
OROMUCOSAL | Status: AC
  Administered 2020-03-22: 15 mL
  Filled 2020-03-22: qty 15

## 2020-03-22 SURGICAL SUPPLY — 30 items
ADH SKN CLS LQ APL DERMABOND (GAUZE/BANDAGES/DRESSINGS) ×1
BLADE CLIPPER SURG (BLADE) ×2 IMPLANT
BNDG GAUZE ELAST 4 BULKY (GAUZE/BANDAGES/DRESSINGS) ×2 IMPLANT
CANISTER SUCT 3000ML PPV (MISCELLANEOUS) ×2 IMPLANT
COVER SURGICAL LIGHT HANDLE (MISCELLANEOUS) ×2 IMPLANT
COVER WAND RF STERILE (DRAPES) ×2 IMPLANT
DERMABOND ADHESIVE PROPEN (GAUZE/BANDAGES/DRESSINGS) ×1
DERMABOND ADVANCED .7 DNX6 (GAUZE/BANDAGES/DRESSINGS) ×1 IMPLANT
DRAPE LAPAROSCOPIC ABDOMINAL (DRAPES) ×2 IMPLANT
DRSG PAD ABDOMINAL 8X10 ST (GAUZE/BANDAGES/DRESSINGS) ×2 IMPLANT
ELECT REM PT RETURN 9FT ADLT (ELECTROSURGICAL) ×2
ELECTRODE REM PT RTRN 9FT ADLT (ELECTROSURGICAL) ×1 IMPLANT
GAUZE SPONGE 4X4 12PLY STRL (GAUZE/BANDAGES/DRESSINGS) ×2 IMPLANT
GLOVE SURG SS PI 7.0 STRL IVOR (GLOVE) ×2 IMPLANT
GLOVE SURG UNDER POLY LF SZ7 (GLOVE) ×2 IMPLANT
GOWN STRL REUS W/ TWL LRG LVL3 (GOWN DISPOSABLE) ×2 IMPLANT
GOWN STRL REUS W/TWL LRG LVL3 (GOWN DISPOSABLE) ×4
KIT BASIN OR (CUSTOM PROCEDURE TRAY) ×2 IMPLANT
KIT TURNOVER KIT B (KITS) ×2 IMPLANT
NEEDLE 22X1 1/2 (OR ONLY) (NEEDLE) ×2 IMPLANT
NS IRRIG 1000ML POUR BTL (IV SOLUTION) ×2 IMPLANT
PACK GENERAL/GYN (CUSTOM PROCEDURE TRAY) ×2 IMPLANT
PAD ARMBOARD 7.5X6 YLW CONV (MISCELLANEOUS) ×2 IMPLANT
PENCIL SMOKE EVACUATOR (MISCELLANEOUS) ×2 IMPLANT
SUT MNCRL AB 4-0 PS2 18 (SUTURE) ×2 IMPLANT
SUT PDS AB 2-0 CT2 27 (SUTURE) ×2 IMPLANT
SYR CONTROL 10ML LL (SYRINGE) ×2 IMPLANT
TAPE CLOTH SURG 6X10 WHT LF (GAUZE/BANDAGES/DRESSINGS) ×2 IMPLANT
TOWEL GREEN STERILE (TOWEL DISPOSABLE) ×2 IMPLANT
TOWEL GREEN STERILE FF (TOWEL DISPOSABLE) ×2 IMPLANT

## 2020-03-22 NOTE — Anesthesia Postprocedure Evaluation (Signed)
Anesthesia Post Note  Patient: Max Pittman.  Procedure(s) Performed: REMOVAL OF PERITONEAL DIALYSIS CATHETER (N/A )     Patient location during evaluation: PACU Anesthesia Type: General Level of consciousness: awake Pain management: pain level controlled Vital Signs Assessment: post-procedure vital signs reviewed and stable Respiratory status: spontaneous breathing Cardiovascular status: stable Postop Assessment: adequate PO intake Anesthetic complications: no   No complications documented.  Last Vitals:  Vitals:   03/22/20 1525 03/22/20 1601  BP: (!) 176/90 (!) 191/89  Pulse:  100  Resp: 19 18  Temp: (!) 36.2 C 36.8 C  SpO2:  100%    Last Pain:  Vitals:   03/22/20 1601  TempSrc: Oral  PainSc:                  CHARLENE GREEN

## 2020-03-22 NOTE — Transfer of Care (Signed)
Immediate Anesthesia Transfer of Care Note  Patient: Max Pittman.  Procedure(s) Performed: REMOVAL OF PERITONEAL DIALYSIS CATHETER (N/A )  Patient Location: PACU  Anesthesia Type:General  Level of Consciousness: drowsy and patient cooperative  Airway & Oxygen Therapy: Patient Spontanous Breathing  Post-op Assessment: Report given to RN and Post -op Vital signs reviewed and stable  Post vital signs: Reviewed and stable  Last Vitals:  Vitals Value Taken Time  BP 153/74 03/22/20 1427  Temp    Pulse 96 03/22/20 1428  Resp 18 03/22/20 1428  SpO2 96 % 03/22/20 1428  Vitals shown include unvalidated device data.  Last Pain:  Vitals:   03/22/20 1202  TempSrc: Oral  PainSc:       Patients Stated Pain Goal: 0 ([id number])  Complications: No complications documented.

## 2020-03-22 NOTE — Progress Notes (Signed)
Patient ID: Max Pittman., male   DOB: [birth year], 45 y.o.   MRN: [id number] [facility] Progress Note   Assessment/ Plan:   1.  Acute hypoxic respiratory failure/ cardiac arrest: due to severe volume overload/pulmonary edema causing asystolic cardiac arrest. Resolved w/ CRRT.  2. ESRD: severe vol overload on admission. SP CRRT 1 week with net negative UF 31 L. Wt dropped 158 to 122 kg. Suspected membrane failure, cannot do PD anymore. Per pt's renal MD will transition to hemodialysis. IR placed Sampson on 2/23. PD cath removal today per gen surgery. Had HD here yesterday, plan next HD tomorrow/ Sat.  4. Retroperitoneal hematoma: seen by CT 2/24 large R iliopsoas hematoma 10 x 7 x 20 cm. Anticoagulation on hold.  3. Anemia ckd: Hb in 8- 9 range, no esa given here > got darbe 100 ug on 2/24. Redose weekly if still here.  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 Nutrition: On renal diet  6. Hypertension: no gross vol ^ on exam. Cont BP meds.   7. H/o VTE - a/c on hold d/t #3 8. ^WBC - resolved, off abx, cx's negative.    [name], MD 03/22/2020, 11:51 AM    Subjective:   Pt in OR this am.    Objective:   BP (!) 179/59   Pulse 93   Temp 98.3 F (36.8 C) (Oral)   Resp 18   Ht '5\' 9"'$  (1.753 m)   Wt 121.3 kg   SpO2 96%   BMI 39.49 kg/m   Physical Exam: Gen: pt is in the OR, not able to examine    Labs: BMET Recent Labs  Lab 03/16/20 0210 03/17/20 0217 03/18/20 0128 03/19/20 0633 03/20/20 0044 03/21/20 1203  NA 137 136 135 132* 133* 133*  K 3.9 4.3 3.5 4.1 3.7 3.8  CL 101 99 96* 96* 96* 96*  CO2 '22 22 23 23 22 '$ 21*  GLUCOSE 249* 250* 257* 154* 156* 97  BUN 38* 39* 46* 56* 59* 58*  CREATININE 6.37* 7.26* 7.91* 8.69* 9.24* 9.64*  CALCIUM 9.7 9.9 9.5 9.4 9.1 8.7*  PHOS 4.2 5.1* 5.4* 5.6*  --   --    CBC Recent Labs  Lab 03/19/20 0633 03/20/20 0044 03/21/20 0108 03/22/20 0052  WBC 20.1* 16.6* 13.0* 10.9*  NEUTROABS   --   --  8.9*  --   HGB 8.8* 8.0* 7.7* 7.3*  HCT 27.3* 24.8* 24.7* 22.9*  MCV 88.3 88.6 89.8 90.2  PLT 446* 442* 457* 468*     Medications:    . [MAR Hold] (feeding supplement) PROSource Plus  30 mL Oral BID WC  . [MAR Hold] sodium chloride   Intravenous Once  . [MAR Hold] amLODipine  5 mg Oral Daily  . [MAR Hold] aspirin  81 mg Oral Daily  . [MAR Hold] atorvastatin  40 mg Oral q1800  . [MAR Hold] buPROPion  150 mg Oral BID WC  . [MAR Hold] cephALEXin  250 mg Oral Q12H  . [MAR Hold] Chlorhexidine Gluconate Cloth  6 each Topical Daily  . [MAR Hold] Chlorhexidine Gluconate Cloth  6 each Topical Q0600  . [MAR Hold] docusate sodium  100 mg Oral BID  . [MAR Hold] feeding supplement  237 mL Oral BID BM  . [MAR Hold] insulin aspart  0-20 Units Subcutaneous Q4H  . [MAR Hold] lamoTRIgine  100 mg Oral BID  . [MAR Hold] levETIRAcetam  500 mg Oral Daily  . [facility] Hold]  mouth rinse  15 mL Mouth Rinse BID  . [MAR Hold] melatonin  5 mg Oral QHS  . [MAR Hold] metoprolol tartrate  12.5 mg Oral BID  . [MAR Hold] multivitamin  1 tablet Oral QHS  . [MAR Hold] pantoprazole  40 mg Oral Daily  . [MAR Hold] polyethylene glycol  17 g Oral Daily  . [MAR Hold] sodium chloride flush  10-40 mL Intracatheter Q12H

## 2020-03-22 NOTE — Progress Notes (Signed)
Pre Procedure note for inpatients:   Max Pittman. has been scheduled for Procedure(s) with comments: REMOVAL OF PERITONEAL DIALYSIS CATHETER (N/A) - ROOM 1 STATING AT 11:00AM FOR 60 MIN today. The various methods of treatment have been discussed with the patient. After consideration of the risks, benefits and treatment options the patient has consented to the planned procedure.   The patient has been seen and labs reviewed. He complains of pain in his right hip and weakness, he has 2/5 strength in that leg. CT scan yesterday shows large retroperitoneal hematoma and he has come off anticoagulation. There are no changes in the patient's condition to prevent proceeding with the planned procedure today.  Recent labs:  Lab Results  Component Value Date   WBC 10.9 (H) 03/22/2020   HGB 7.3 (L) 03/22/2020   HCT 22.9 (L) 03/22/2020   PLT 468 (H) 03/22/2020   GLUCOSE 97 03/21/2020   CHOL 82 05/22/2019   TRIG 208 (H) 03/11/2020   HDL 25 (L) 05/22/2019   LDLCALC 34 05/22/2019   ALT 15 03/20/2020   AST 40 03/20/2020   NA 133 (L) 03/21/2020   K 3.8 03/21/2020   CL 96 (L) 03/21/2020   CREATININE 9.64 (H) 03/21/2020   BUN 58 (H) 03/21/2020   CO2 21 (L) 03/21/2020   INR 1.6 (H) 03/22/2020   HGBA1C 5.6 03/07/2020    Mickeal Skinner, MD 03/22/2020 7:50 AM

## 2020-03-22 NOTE — Progress Notes (Signed)
OT Cancellation Note  Patient Details Name: Max Pittman. MRN: [id number] DOB: 21-Jan-1976   Cancelled Treatment:    Reason Eval/Treat Not Completed: Patient at procedure or test/ unavailable.  OT to continues efforts as appropriate.    Shravya Wickwire D Lumi Winslett 03/22/2020, 2:13 PM

## 2020-03-22 NOTE — Anesthesia Preprocedure Evaluation (Addendum)
Anesthesia Evaluation  Patient identified by MRN, date of birth, ID band Patient awake    Reviewed: Allergy & Precautions, NPO status , Patient's Chart, lab work & pertinent test results  Airway Mallampati: II  TM Distance: >3 FB Neck ROM: Full    Dental no notable dental hx. (+) Teeth Intact, Dental Advisory Given   Pulmonary sleep apnea , former smoker,  Quit smoking 10/2018   Pulmonary exam normal breath sounds clear to auscultation       Cardiovascular hypertension, Pt. on medications + Past MI (admitted s/p cardiac arrest 2/2 RCA spasm which resolved w/ vasodilators) and + DVT (on coumadin outpt, transitioned to heparin gtt- off at 3am)  Normal cardiovascular exam Rhythm:Regular Rate:Normal  Admitted 03/07/20 - presented to ER with increasing dyspnea and hypoxic respiratory failure.  The patient initially refused intervention upon EMS arrival but subsequently experience cardiopulmonary arrest/asystole.  CPR in the field.  Per verbal report, ROSC after 12 min.   Echo 03/07/20: 1. Left ventricular ejection fraction, by estimation, is 55 to 60%. The  left ventricle has normal function. The left ventricle has no regional  wall motion abnormalities. There is moderate concentric left ventricular  hypertrophy. Left ventricular  diastolic parameters are indeterminate.  2. Right ventricular systolic function is normal. The right ventricular  size is normal.  3. The mitral valve is normal in structure. Trivial mitral valve  regurgitation. No evidence of mitral stenosis.  4. The aortic valve is grossly normal. Aortic valve regurgitation is not  visualized. No aortic stenosis is present.   Cath 03/22/19:   Mid LAD lesion is 20% stenosed.  The left ventricular systolic function is normal.  LV end diastolic pressure is mildly elevated.  Prox RCA lesion is 40% stenosed.   Neuro/Psych CVA, No Residual Symptoms negative psych ROS    GI/Hepatic Neg liver ROS, GERD  Medicated and Controlled,  Endo/Other  diabetes, Poorly Controlled, Type 2, Insulin DependentMorbid obesityBMI 66  Renal/GU ESRF and DialysisRenal diseaseOn PD currently, being transitioned to HD via catheter  negative genitourinary   Musculoskeletal  (+) Arthritis , Osteoarthritis,    Abdominal (+) + obese,   Peds  Hematology  (+) Blood dyscrasia, anemia , H/H 7.3/22.9- transfuse before OR   Anesthesia Other Findings   Reproductive/Obstetrics negative OB ROS                            Anesthesia Physical Anesthesia Plan  ASA: IV  Anesthesia Plan: General   Post-op Pain Management:    Induction: Intravenous  PONV Risk Score and Plan: 2 and Ondansetron, Dexamethasone and Treatment may vary due to age or medical condition  Airway Management Planned: LMA  Additional Equipment: None  Intra-op Plan:   Post-operative Plan: Extubation in OR  Informed Consent: I have reviewed the patients History and Physical, chart, labs and discussed the procedure including the risks, benefits and alternatives for the proposed anesthesia with the patient or authorized representative who has indicated his/her understanding and acceptance.     Dental advisory given  Plan Discussed with: CRNA  Anesthesia Plan Comments: (Getting one unit of prbc in preop)       Anesthesia Quick Evaluation

## 2020-03-22 NOTE — Progress Notes (Signed)
PT Cancellation Note  Patient Details Name: Max Pittman. MRN: [id number] DOB: 12-11-1975   Cancelled Treatment:    Reason Eval/Treat Not Completed: Patient at procedure or test/unavailable - will check back as schedule allows.  Stacie Glaze, PT Acute Rehabilitation Services Pager [phone]  Office [phone]    Louis Matte 03/22/2020, 9:11 AM

## 2020-03-22 NOTE — Op Note (Addendum)
PATIENT: Max Pittman. is a 45 y.o. male   PRE-OPERATIVE DIAGNOSIS:  Non-adherence with peritoneal dialysis   POST-OPERATIVE DIAGNOSIS:  Non-adherence with peritoneal dialysis   PROCEDURE:  Removal of peritoneal dialysis catheter   SURGEON:  Surgeon(s) and Role: Geoffery Spruce, MD   ASSISTANTS: Konstantinos Economopoulos, MD    ANESTHESIA: General endotracheal   EBL:  5 mL    BLOOD ADMINISTERED: None   DRAINS: None    SPECIMEN: none   DISPOSITION OF SPECIMEN: n/a   COUNTS:  Sponge an instrument count was correct   DICTATION:  The patient was taken to the operating and placed in the supine position in the operating room table. Pneumatic compression stockings were placed in bilateral lower extremities. The patient was prepped and draped in the usual sterile fashion. A time out was performed per hospital's policy. Antibiotics were administered.   A 6 cm horizontal incision was made in the left lower quadrant. The incision was carried down to the fascia with the bovie electrocautery and the peritoneal dialysis catheter was circumferentially freed from surrounding tissue. The proximal half of the catheter was identified and freed from surrounding tissues. The distal cuff was identified and dissection was performed circumferentially. The catheter broke in two pieces during this step of the procedure. The proximal part of the catheter was removed. The skin incision was extended 1 cm and the fascia was dilated. The distal part of the catheter was removed from the peritoneal cavity intact.   The skin surrounding the peritoneal catheter insertion site was removed by making a 3 cm elliptical incision.   The rectus fascia was closed with running 2-0 PDS. The skin was then closed with a subcuticular continuous 4-0 Monocryl and Dermabond skin glue.     The patient was awaken and taken to the recovery room in stable condition.   PATIENT DISPOSITION:  PACU - hemodynamically  stable.

## 2020-03-22 NOTE — Progress Notes (Signed)
[facility] PROGRESS NOTE    Progress Note  Max Pittman.  [id number] DOB: [birth year] DOA: 03/07/2020 PCP: Chesley Noon, MD     Brief Narrative:   Max Pittman. is an 45 y.o. male past medical history of end-stage renal disease on peritoneal dialysis, diabetes mellitus type 2, essential hypertension, DVT on Coumadin sleep apnea comes into the hospital for shortness of breath on arrival to the ED had a cardiac arrest with 12 minutes of CPR before ROSC admitted to the ICU and intubated on 03/07/2020 self extubated on 01/08/2021 transferred to the hospitalist service on 01/11/2021, now requiring CRRT in the ICU.  Was transitioned to hemodialysis, nephrology and IR working on removing his temporary catheter and placing tunneled catheter for permanent dialysis and removing her peritoneal dialysis catheter.   Assessment/Plan:   Acute respiratory failure with hypoxia (HCC) leading to cardiac arrest: In the setting of fluid overload. He has been weaned to room air. Physical therapy evaluated the patient recommended skilled nursing facility placement.  End-stage renal disease on peritoneal dialysis/hyperkalemia/hypomagnesemia: Tunneled catheter placed on 03/20/2020 by IR. Post HD on 03/20/2020.   For peritoneal catheter removal on 03/22/2020 Still appears fluid overloaded on physical exam with severe anasarca, has been transition to hemodialysis. Renal is recommended to dialyze again today. Now off peritoneal dialysis. Will need to be set up for outpatient hemodialysis.  Leucocytosis due to spontaneous iliopsoas hematoma CT scan of the abdomen and pelvis done on 03/21/2020 showed large left psoas hematoma ~about 10 x 7 and extending 20 cm in length. Leukocytosis improving, T-max of 98.8 has remained afebrile. No antibiotics at this point in time. 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 He was hospitalized at that time and completed 6 weeks of IV antibiotics now on suppressive therapy with Keflex. Blood cultures and fluid cultures were drawn on 03/19/2018 have remained negative till date.  Cardiogenic shock: Now resolved.  Obstructive sleep apnea: Noncompliant with CPAP.  Acute metabolic encephalopathy: Probably due to respiratory distress.  Deconditioning: Physical therapy evaluated the patient recommended skilled nursing facility continue to work with physical therapy.  History of VTE: He was on heparin infusion, heparin was stopped to continue iliopsoas hematoma. His thromboembolic event was accessed 07/06/2019 he has completed his his treatment this was his first episode. We will discontinue all anticoagulation.  History of CVA/history of seizures: EEG on 03/09/2019 showed no seizure disorder continue Lamictal and Keppra.  Essential hypertension: Continue amlodipine metoprolol.  Normocytic anemia: We will go ahead and transfuse 1 unit of packed red blood cells recheck a CBC posttransfusional.   DVT prophylaxis: lovenox Family Communication:none Status is: Inpatient  Remains inpatient appropriate because:Hemodynamically unstable   Dispo: The patient is from: Home              Anticipated d/c is to: SNF              Anticipated d/c date is: 1days              Patient currently is not medically stable to d/c.   Difficult to place patient No  Code Status:     Code Status Orders  (From admission, onward)         Start     Ordered   03/07/20 0440  Full code  Continuous  03/07/20 0441        Code Status History    Date Active Date Inactive Code Status Order ID Comments User Context   07/13/2019 2250 07/28/2019 2220 Full Code [id number]  Bethena Roys, MD Inpatient   07/01/2019 0254 07/06/2019 2206 Full Code [id number]  Edmonia Lynch, DO ED    06/02/2019 1610 06/17/2019 1520 Full Code [id number]  Elizabeth Sauer Inpatient   06/02/2019 1610 06/02/2019 1610 Full Code [id number]  Elizabeth Sauer Inpatient   05/21/2019 0025 06/02/2019 1557 Full Code [id number]  Danne Baxter Inpatient   03/22/2019 [id number] 03/24/2019 1612 Full Code [id number]  Bethena Roys, MD Inpatient   Advance Care Planning Activity        IV Access:    Peripheral IV   Procedures and diagnostic studies:   CT ABDOMEN PELVIS W CONTRAST  Result Date: 03/21/2020 CLINICAL DATA:  Diffuse abdominal pain EXAM: CT ABDOMEN AND PELVIS WITH CONTRAST TECHNIQUE: Multidetector CT imaging of the abdomen and pelvis was performed using the standard protocol following bolus administration of intravenous contrast. Sagittal and coronal MPR images reconstructed from axial data set. CONTRAST:  129m OMNIPAQUE IOHEXOL 300 MG/ML SOLN IV. Dilute oral contrast. COMPARISON:  05/20/2019 FINDINGS: Lower chest: Bibasilar atelectasis and small pleural effusions greater on LEFT. Hepatobiliary: Gallbladder surgically absent.  Liver unremarkable. Pancreas: Normal appearance Spleen: Normal appearance Adrenals/Urinary Tract: Adrenal glands normal appearance. Marked BILATERAL renal cortical atrophy. Small BILATERAL renal cysts. No hydronephrosis, ureteral dilatation or definite urinary tract calcification. Peritoneal dialysis catheter present. Stomach/Bowel: Normal appendix. Stomach and bowel loops normal appearance. Vascular/Lymphatic: Scattered atherosclerotic calcifications aorta and iliac arteries. Aorta normal caliber. No adenopathy. Reproductive: Unremarkable prostate gland and seminal vesicles Other: Scattered free fluid consistent with peritoneal dialysis. No definite free air. Small umbilical hernia containing fat as well as a small focus of air, which could be related to the peritoneal dialysis catheter. LEFT flank hernia containing ascites. Musculoskeletal: Bones demineralized.  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 Osteolysis of LEFT femoral head with fluid in the LEFT hip joint and superior subluxation of the proximal LEFT femur. Large RIGHT iliopsoas hematoma largest component measuring 10.0 x 7.6 cm in greatest axial dimensions and is 67 and extending for approximately 20 cm. Scattered subcutaneous edema at the flanks. IMPRESSION: Large RIGHT iliopsoas hematoma 10.0 x 7.6 in axial dimensions and extending 20 cm length. 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 Small umbilical hernia containing fat as well as a small focus of air, which could be related to the peritoneal dialysis catheter. Bibasilar atelectasis and small pleural effusions greater on LEFT. Marked BILATERAL renal cortical atrophy with small BILATERAL renal cysts consistent with end-stage renal disease. LEFT flank hernia containing ascites. Aortic Atherosclerosis (ICD10-I70.0). Findings called to Dr. FAileen Fasson 03/21/2020 at 1606 hours. Electronically Signed   By: MLavonia DanaM.D.   On: 03/21/2020 16:06   IR [place] Vasc Access Right  Result Date: 03/20/2020 INDICATION: End-stage renal disease EXAM: TUNNELED CENTRAL VENOUS HEMODIALYSIS CATHETER PLACEMENT WITH ULTRASOUND AND FLUOROSCOPIC GUIDANCE MEDICATIONS: Ancef 2 g IV. The antibiotic was given in an appropriate time interval prior to skin puncture. ANESTHESIA/SEDATION: Moderate (conscious) sedation was employed during this procedure. A total of Versed 2 mg and Fentanyl 100 mcg was administered intravenously. Moderate Sedation Time: 10 minutes. The patient's level  of consciousness and vital signs were monitored continuously by radiology nursing throughout the procedure under my direct supervision. FLUOROSCOPY TIME:  Fluoroscopy Time: 0 minutes 48 seconds (8 mGy).  COMPLICATIONS: None immediate. PROCEDURE: Informed written consent was obtained from the patient after a discussion of the risks, benefits, and alternatives to treatment. Questions regarding the procedure were encouraged and answered. The right neck and chest were prepped with chlorhexidine in a sterile fashion, and a sterile drape was applied covering the operative field. Maximum barrier sterile technique with sterile gowns and gloves were used for the procedure. A timeout was performed prior to the initiation of the procedure. The right internal jugular vein was evaluated with ultrasound and shown to be patent. A permanent ultrasound image was obtained and placed in the patient's medical record. Using sterile gel and a sterile probe cover, the right internal jugular vein was entered with a 21 ga needle during real time ultrasound guidance. 0.018 inch guidewire placed and 21 ga needle exchanged for transitional dilator set. Utilizing fluoroscopy, 0.035 inch guidewire advanced through the needle without difficulty. Seriel dilation was performed and peel-away sheath was placed. Attention then turned to the right anterior upper chest. Following local lidocaine administration, the hemodialysis catheter was tunneled from the chest wall to the venotomy site. The catheter was inserted through the peel-away sheath. The tip of the catheter was positioned within the right atrium using fluoroscopic guidance. All lumens of the catheter aspirated and flushed well. The dialysis lumens were locked with Heparin. The catheter was secured to the skin with suture. The insertion site was covered with a Biopatch and sterile dressing. Temporary right non tunneled hemodialysis catheter was removed without difficulty and hemostasis achieved with manual compression. IMPRESSION: Successful placement of 23 cm tip to cuff tunneled hemodialysis catheter via the right internal jugular vein with tips terminating within the superior aspect of the  right atrium. The catheter is ready for immediate use. Electronically Signed   By: Miachel Roux M.D.   On: 03/20/2020 15:10   IR TUNNELED CENTRAL VENOUS CATHETER PLACEMENT  Result Date: 03/20/2020 INDICATION: End-stage renal disease EXAM: TUNNELED CENTRAL VENOUS HEMODIALYSIS CATHETER PLACEMENT WITH ULTRASOUND AND FLUOROSCOPIC GUIDANCE MEDICATIONS: Ancef 2 g IV. The antibiotic was given in an appropriate time interval prior to skin puncture. ANESTHESIA/SEDATION: Moderate (conscious) sedation was employed during this procedure. A total of Versed 2 mg and Fentanyl 100 mcg was administered intravenously. Moderate Sedation Time: 10 minutes. The patient's level of consciousness and vital signs were monitored continuously by radiology nursing throughout the procedure under my direct supervision. FLUOROSCOPY TIME:  Fluoroscopy Time: 0 minutes 48 seconds (8 mGy). COMPLICATIONS: None immediate. PROCEDURE: Informed written consent was obtained from the patient after a discussion of the risks, benefits, and alternatives to treatment. Questions regarding the procedure were encouraged and answered. The right neck and chest were prepped with chlorhexidine in a sterile fashion, and a sterile drape was applied covering the operative field. Maximum barrier sterile technique with sterile gowns and gloves were used for the procedure. A timeout was performed prior to the initiation of the procedure. The right internal jugular vein was evaluated with ultrasound and shown to be patent. A permanent ultrasound image was obtained and placed in the patient's medical record. Using sterile gel and a sterile probe cover, the right internal jugular vein was entered with a 21 ga needle during real time ultrasound guidance. 0.018 inch guidewire placed and 21 ga needle exchanged for transitional dilator set. Utilizing fluoroscopy, 0.035 inch guidewire advanced  through the needle without difficulty. Seriel dilation was performed and peel-away  sheath was placed. Attention then turned to the right anterior upper chest. Following local lidocaine administration, the hemodialysis catheter was tunneled from the chest wall to the venotomy site. The catheter was inserted through the peel-away sheath. The tip of the catheter was positioned within the right atrium using fluoroscopic guidance. All lumens of the catheter aspirated and flushed well. The dialysis lumens were locked with Heparin. The catheter was secured to the skin with suture. The insertion site was covered with a Biopatch and sterile dressing. Temporary right non tunneled hemodialysis catheter was removed without difficulty and hemostasis achieved with manual compression. IMPRESSION: Successful placement of 23 cm tip to cuff tunneled hemodialysis catheter via the right internal jugular vein with tips terminating within the superior aspect of the right atrium. The catheter is ready for immediate use. Electronically Signed   By: Miachel Roux M.D.   On: 03/20/2020 15:10     Medical Consultants:    None.   Subjective:    Max Pittman. continues to complain of back pain.  Objective:    Vitals:   03/21/20 1820 03/21/20 1954 03/22/20 0000 03/22/20 0427  BP:  (!) 180/88 137/72 140/72  Pulse:  100 97 100  Resp: '16 18 19 20  '$ Temp:  98.4 F (36.9 C) 98.2 F (36.8 C) 98 F (36.7 C)  TempSrc:  Oral Oral Oral  SpO2:  100% 100% 92%  Weight:      Height:       SpO2: 92 % O2 Flow Rate (L/min): 2 L/min FiO2 (%): 97 %   Intake/Output Summary (Last 24 hours) at 03/22/2020 0738 Last data filed at 03/22/2020 0540 Gross per 24 hour  Intake 120 ml  Output 3125 ml  Net -3005 ml   Filed Weights   03/21/20 0800 03/21/20 1500 03/21/20 1806  Weight: 123.5 kg 123.8 kg 121.3 kg    Exam: General exam: In no acute distress. Respiratory system: Good air movement and clear to auscultation. Cardiovascular system: S1 & S2 heard, RRR. No JVD. Gastrointestinal system: Abdomen is  nondistended, soft and nontender.  Extremities: 2+ edema Skin: No rashes, lesions or ulcers Psychiatry: Judgement and insight appear normal. Mood & affect appropriate.   Data Reviewed:    Labs: Basic Metabolic Panel: Recent Labs  Lab 03/16/20 0210 03/17/20 0217 03/18/20 0128 03/19/20 [id number] 03/20/20 0044 03/21/20 1203  NA 137 136 135 132* 133* 133*  K 3.9 4.3 3.5 4.1 3.7 3.8  CL 101 99 96* 96* 96* 96*  CO2 '22 22 23 23 22 '$ 21*  GLUCOSE 249* 250* 257* 154* 156* 97  BUN 38* 39* 46* 56* 59* 58*  CREATININE 6.37* 7.26* 7.91* 8.69* 9.24* 9.64*  CALCIUM 9.7 9.9 9.5 9.4 9.1 8.7*  MG 2.5* 2.6* 2.3 2.4  --   --   PHOS 4.2 5.1* 5.4* 5.6*  --   --    GFR Estimated Creatinine Clearance: 12.7 mL/min (A) (by C-G formula based on SCr of 9.64 mg/dL (H)). Liver Function Tests: Recent Labs  Lab 03/16/20 0210 03/17/20 0217 03/18/20 0128 03/19/20 0633 03/20/20 0044  AST 25  --   --   --  40  ALT 24  --   --   --  15  ALKPHOS 91  --   --   --  76  BILITOT 0.6  --   --   --  0.5  PROT 7.5  --   --   --  6.7  ALBUMIN 2.7* 2.9* 2.5* 2.5* 2.2*   No results for input(s): LIPASE, AMYLASE in the last 168 hours. No results for input(s): AMMONIA in the last 168 hours. Coagulation profile Recent Labs  Lab 03/18/20 0128 03/19/20 [id number] 03/20/20 0044 03/21/20 0108 03/22/20 0052  INR 1.4* 1.3* 1.3* 1.4* 1.6*   COVID-19 Labs  No results for input(s): DDIMER, FERRITIN, LDH, CRP in the last 72 hours.  Lab Results  Component Value Date   SARSCOV2NAA NEGATIVE 03/07/2020   [place] NEGATIVE 07/13/2019   [place] NEGATIVE 06/30/2019   [place] NEGATIVE 05/20/2019    CBC: Recent Labs  Lab 03/18/20 0128 03/19/20 0633 03/20/20 0044 03/21/20 0108 03/22/20 0052  WBC 14.8* 20.1* 16.6* 13.0* 10.9*  NEUTROABS  --   --   --  8.9*  --   HGB 8.7* 8.8* 8.0* 7.7* 7.3*  HCT 27.0* 27.3* 24.8* 24.7* 22.9*  MCV 90.3 88.3 88.6 89.8 90.2  PLT 373 446* 442* 457* 468*   Cardiac  Enzymes: No results for input(s): CKTOTAL, CKMB, CKMBINDEX, TROPONINI in the last 168 hours. BNP (last 3 results) No results for input(s): PROBNP in the last 8760 hours. CBG: Recent Labs  Lab 03/21/20 0748 03/21/20 1134 03/21/20 2022 03/22/20 0002 03/22/20 0429  GLUCAP 122* 99 85 122* 102*   D-Dimer: No results for input(s): DDIMER in the last 72 hours. Hgb A1c: No results for input(s): HGBA1C in the last 72 hours. Lipid Profile: No results for input(s): CHOL, HDL, LDLCALC, TRIG, CHOLHDL, LDLDIRECT in the last 72 hours. Thyroid function studies: No results for input(s): TSH, T4TOTAL, T3FREE, THYROIDAB in the last 72 hours.  Invalid input(s): FREET3 Anemia work up: No results for input(s): VITAMINB12, FOLATE, FERRITIN, TIBC, IRON, RETICCTPCT in the last 72 hours. Sepsis Labs: Recent Labs  Lab 03/19/20 [id number] 03/20/20 0044 03/21/20 0108 03/22/20 0052  WBC 20.1* 16.6* 13.0* 10.9*   Microbiology Recent Results (from the past 240 hour(s))  Culture, blood (Routine X 2) w Reflex to ID Panel     Status: None (Preliminary result)   Collection Time: 03/19/20 10:30 AM   Specimen: BLOOD RIGHT HAND  Result Value Ref Range Status   Specimen Description BLOOD RIGHT HAND  Final   Special Requests   Final    BOTTLES DRAWN AEROBIC ONLY Blood Culture results may not be optimal due to an inadequate volume of blood received in culture bottles   Culture   Final    NO GROWTH 2 DAYS Performed at [facility], [place] [address] [place], [place] [address]    Report Status PENDING  Incomplete  Culture, blood (Routine X 2) w Reflex to ID Panel     Status: None (Preliminary result)   Collection Time: 03/19/20 10:41 AM   Specimen: BLOOD RIGHT WRIST  Result Value Ref Range Status   Specimen Description BLOOD RIGHT WRIST  Final   Special Requests   Final    BOTTLES DRAWN AEROBIC AND ANAEROBIC Blood Culture adequate volume   Culture   Final    NO GROWTH 2 DAYS Performed at [facility], [place] [address], [place], [place] [address]    Report Status PENDING  Incomplete  Body fluid culture w Gram Stain     Status: None (Preliminary result)   Collection Time: 03/20/20  8:12 AM   Specimen: Peritoneal Dialysate; Body Fluid  Result Value Ref Range Status   Specimen Description PERITONEAL DIALYSATE  Final   Special Requests Immunocompromised  Final   Gram Stain  Final    RARE WBC PRESENT, PREDOMINANTLY MONONUCLEAR NO ORGANISMS SEEN    Culture   Final    NO GROWTH < 24 HOURS Performed at [place] [address] [place], [place] [address]    Report Status PENDING  Incomplete     Medications:   . (feeding supplement) PROSource Plus  30 mL Oral BID WC  . amLODipine  5 mg Oral Daily  . aspirin  81 mg Oral Daily  . atorvastatin  40 mg Oral q1800  . buPROPion  150 mg Oral BID WC  . cephALEXin  250 mg Oral Q12H  . Chlorhexidine Gluconate Cloth  6 each Topical Daily  . Chlorhexidine Gluconate Cloth  6 each Topical Q0600  . docusate sodium  100 mg Oral BID  . feeding supplement  237 mL Oral BID BM  . insulin aspart  0-20 Units Subcutaneous Q4H  . lamoTRIgine  100 mg Oral BID  . levETIRAcetam  500 mg Oral Daily  . mouth rinse  15 mL Mouth Rinse BID  . melatonin  5 mg Oral QHS  . metoprolol tartrate  12.5 mg Oral BID  . multivitamin  1 tablet Oral QHS  . pantoprazole  40 mg Oral Daily  . polyethylene glycol  17 g Oral Daily  . sodium chloride flush  10-40 mL Intracatheter Q12H   Continuous Infusions: .  ceFAZolin (ANCEF) IV        LOS: 15 days   Charlynne Cousins  [facility]  03/22/2020, 7:38 AM

## 2020-03-22 NOTE — Anesthesia Procedure Notes (Signed)
Procedure Name: Intubation Date/Time: 03/22/2020 1:31 PM Performed by: Colin Benton, CRNA Pre-anesthesia Checklist: Patient identified, Emergency Drugs available, Suction available and Patient being monitored Patient Re-evaluated:Patient Re-evaluated prior to induction Oxygen Delivery Method: Circle system utilized Preoxygenation: Pre-oxygenation with 100% oxygen Induction Type: IV induction Ventilation: Mask ventilation without difficulty Laryngoscope Size: Mac and 3 Grade View: Grade I Tube type: Oral Tube size: 7.5 mm Number of attempts: 1 Airway Equipment and Method: Stylet Placement Confirmation: ETT inserted through vocal cords under direct vision,  positive ETCO2 and breath sounds checked- equal and bilateral Secured at: 22 cm Tube secured with: Tape Dental Injury: Teeth and Oropharynx as per pre-operative assessment

## 2020-03-23 DIAGNOSIS — D649 Anemia, unspecified: Secondary | ICD-10-CM | POA: Diagnosis not present

## 2020-03-23 DIAGNOSIS — J9601 Acute respiratory failure with hypoxia: Secondary | ICD-10-CM | POA: Diagnosis not present

## 2020-03-23 DIAGNOSIS — N186 End stage renal disease: Secondary | ICD-10-CM | POA: Diagnosis not present

## 2020-03-23 DIAGNOSIS — N189 Chronic kidney disease, unspecified: Secondary | ICD-10-CM | POA: Diagnosis not present

## 2020-03-23 LAB — COMPREHENSIVE METABOLIC PANEL
ALT: 5 U/L (ref 0–44)
AST: 19 U/L (ref 15–41)
Albumin: 1.9 g/dL — ABNORMAL LOW (ref 3.5–5.0)
Alkaline Phosphatase: 73 U/L (ref 38–126)
Anion gap: 13 (ref 5–15)
BUN: 36 mg/dL — ABNORMAL HIGH (ref 6–20)
CO2: 23 mmol/L (ref 22–32)
Calcium: 8.7 mg/dL — ABNORMAL LOW (ref 8.9–10.3)
Chloride: 99 mmol/L (ref 98–111)
Creatinine, Ser: 8.41 mg/dL — ABNORMAL HIGH (ref 0.61–1.24)
GFR, Estimated: 7 mL/min — ABNORMAL LOW (ref >60)
Glucose, Bld: 94 mg/dL (ref 70–99)
Potassium: 4.1 mmol/L (ref 3.5–5.1)
Sodium: 135 mmol/L (ref 135–145)
Total Bilirubin: 0.6 mg/dL (ref 0.3–1.2)
Total Protein: 6.2 g/dL — ABNORMAL LOW (ref 6.5–8.1)

## 2020-03-23 LAB — CBC
HCT: 28.9 % — ABNORMAL LOW (ref 39.0–52.0)
Hemoglobin: 8.8 g/dL — ABNORMAL LOW (ref 13.0–17.0)
MCH: 28.1 pg (ref 26.0–34.0)
MCHC: 30.4 g/dL (ref 30.0–36.0)
MCV: 92.3 fL (ref 80.0–100.0)
Platelets: 499 10*3/uL — ABNORMAL HIGH (ref 150–400)
RBC: 3.13 MIL/uL — ABNORMAL LOW (ref 4.22–5.81)
RDW: 16.8 % — ABNORMAL HIGH (ref 11.5–15.5)
WBC: 12.7 10*3/uL — ABNORMAL HIGH (ref 4.0–10.5)
nRBC: 0.2 % (ref 0.0–0.2)

## 2020-03-23 LAB — BPAM RBC
Blood Product Expiration Date: 202203162359
ISSUE DATE / TIME: 202202250949
Unit Type and Rh: 6200

## 2020-03-23 LAB — TYPE AND SCREEN
ABO/RH(D): A POS
Antibody Screen: NEGATIVE
Unit division: 0

## 2020-03-23 LAB — GLUCOSE, CAPILLARY
Glucose-Capillary: 116 mg/dL — ABNORMAL HIGH (ref 70–99)
Glucose-Capillary: 177 mg/dL — ABNORMAL HIGH (ref 70–99)
Glucose-Capillary: 70 mg/dL (ref 70–99)
Glucose-Capillary: 73 mg/dL (ref 70–99)
Glucose-Capillary: 93 mg/dL (ref 70–99)
Glucose-Capillary: 94 mg/dL (ref 70–99)

## 2020-03-23 LAB — BODY FLUID CULTURE W GRAM STAIN: Culture: NO GROWTH

## 2020-03-23 LAB — PROTIME-INR
INR: 1.4 — ABNORMAL HIGH (ref 0.8–1.2)
Prothrombin Time: 16.8 seconds — ABNORMAL HIGH (ref 11.4–15.2)

## 2020-03-23 MED ORDER — ISOSORBIDE MONONITRATE ER 30 MG PO TB24
15.0000 mg | ORAL_TABLET | Freq: Every day | ORAL | Status: DC
  Administered 2020-03-24 – 2020-03-26 (×3): 15 mg via ORAL
  Filled 2020-03-23 (×4): qty 1

## 2020-03-23 MED ORDER — LORATADINE 10 MG PO TABS
10.0000 mg | ORAL_TABLET | Freq: Every day | ORAL | Status: DC
  Administered 2020-03-24 – 2020-03-26 (×3): 10 mg via ORAL
  Filled 2020-03-23 (×4): qty 1

## 2020-03-23 MED ORDER — PREGABALIN 25 MG PO CAPS
50.0000 mg | ORAL_CAPSULE | Freq: Three times a day (TID) | ORAL | Status: DC
  Administered 2020-03-23 (×2): 50 mg via ORAL
  Filled 2020-03-23 (×2): qty 2

## 2020-03-23 MED ORDER — DARBEPOETIN ALFA 100 MCG/0.5ML IJ SOSY: 100.0000 ug | PREFILLED_SYRINGE | INTRAMUSCULAR | Status: DC

## 2020-03-23 MED ORDER — FLUOXETINE HCL 10 MG PO CAPS
10.0000 mg | ORAL_CAPSULE | Freq: Every day | ORAL | Status: DC
  Administered 2020-03-24 – 2020-03-25 (×2): 10 mg via ORAL
  Filled 2020-03-23 (×3): qty 1

## 2020-03-23 MED ORDER — HEPARIN SODIUM (PORCINE) 1000 UNIT/ML IJ SOLN
INTRAMUSCULAR | Status: AC
  Administered 2020-03-23: 1000 [IU]
  Filled 2020-03-23: qty 4

## 2020-03-23 MED ORDER — SEVELAMER CARBONATE 800 MG PO TABS
2400.0000 mg | ORAL_TABLET | Freq: Three times a day (TID) | ORAL | Status: DC
  Administered 2020-03-24 – 2020-03-26 (×4): 2400 mg via ORAL
  Filled 2020-03-23 (×7): qty 3

## 2020-03-23 MED ORDER — HYDROMORPHONE HCL 1 MG/ML IJ SOLN
INTRAMUSCULAR | Status: AC
  Filled 2020-03-23: qty 0.5

## 2020-03-23 NOTE — Progress Notes (Signed)
Physical Therapy Treatment Patient Details Name: Max Pittman. MRN: [id number] DOB: 1975/07/28 Today's Date: 03/23/2020    History of Present Illness Max Pittman. is a 45 y.o. male who during early AM hours 2/10,  had worsening dyspnea at home.  Had apparently been experiencing dyspnea and trying to manage it with home peritoneal dialysis.  EMS was called and he initially refused transport to ED; however, after their arrival, he had a cardiac arrest.  Initial rhythm was asystole and he required 12 minutes before ROSC. Pt intubated 2/10, self extubated at 2/14 at midnight.  Also on CRRT.  PMH: including but not limited to ESRD on PD, DM, NSTEMI, HTN, DVT on warfarin, OSA.    PT Comments    Pt showing steady progress toward goals.  Can now see pt being able to handle the intensity of CIR.  Emphasis on transition to EOB without compensations, sit to stand x5,  Pre gait activities and marching in place and resistance exercises to strengthen and warm up.   Follow Up Recommendations  CIR     Equipment Recommendations  Rolling walker with 5" wheels;3in1 (PT)    Recommendations for Other Services Rehab consult     Precautions / Restrictions Precautions Precautions: Fall    Mobility  Bed Mobility Overal bed mobility: Needs Assistance Bed Mobility: Rolling;Sidelying to Sit;Sit to Sidelying Rolling: Supervision Sidelying to sit: Min guard     Sit to sidelying: Min assist      Transfers Overall transfer level: Needs assistance Equipment used: Ambulation equipment used Transfers: Sit to/from Stand;Lateral/Scoot Transfers Sit to Stand: Min assist (x5, 3 from bed, 2 from STEDY)        Lateral/Scoot Transfers: Supervision (4 feet down in the bed to position for STEDY)    Ambulation/Gait Ambulation/Gait assistance: Min assist   Assistive device:  (STEDY) Gait Pattern/deviations: Step-to pattern     General Gait Details: pt marched in place for 20 steps in place  each leg with exaggerated though relatively low steps  (times 2 sets over 2 standing trials)   Stairs             Wheelchair Mobility    Modified Rankin (Stroke Patients Only)       Balance Overall balance assessment: Needs assistance Sitting-balance support: No upper extremity supported Sitting balance-Leahy Scale: Fair Sitting balance - Comments: able to maintain balance without UE assist   Standing balance support: Bilateral upper extremity supported;Single extremity supported;During functional activity Standing balance-Leahy Scale: Poor Standing balance comment: 2 long standing trials out of 5 that pt marched in place x20 reps.  Worked on standing technique for for efficient stand.                            Cognition Arousal/Alertness: Awake/alert Behavior During Therapy: WFL for tasks assessed/performed Overall Cognitive Status: Within Functional Limits for tasks assessed                                        Exercises General Exercises - Lower Extremity Ankle Circles/Pumps: AROM;Both;Other reps (comment);Supine;Other (comment) (30) Heel Slides: AROM;Strengthening;10 reps;Supine;Other (comment);Both (heavily resisted gross extension) Hip ABduction/ADduction: AROM;Right;AAROM;Left;10 reps;Supine Straight Leg Raises: AAROM;Left;10 reps;Supine    General Comments General comments (skin integrity, edema, etc.): Each marching in place trial caused EHR to rise to 141 bpm.  Sats maintained well into  the 90's      Pertinent Vitals/Pain Pain Assessment: Faces Faces Pain Scale: Hurts little more Pain Location: R knee/hip>L knee Pain Descriptors / Indicators: Grimacing;Guarding;Discomfort Pain Intervention(s): Monitored during session    Home Living                      Prior Function            PT Goals (current goals can now be found in the care plan section) Acute Rehab PT Goals Patient Stated Goal: to get stronger and  maybe rehab before home with [facility] PT Goal Formulation: With patient Time For Goal Achievement: 03/25/20 Potential to Achieve Goals: Good Progress towards PT goals: Progressing toward goals    Frequency    Min 3X/week      PT Plan Discharge plan needs to be updated    Co-evaluation              AM-PAC PT "6 Clicks" Mobility   Outcome Measure  Help needed turning from your back to your side while in a flat bed without using bedrails?: A Little Help needed moving from lying on your back to sitting on the side of a flat bed without using bedrails?: A Little Help needed moving to and from a bed to a chair (including a wheelchair)?: A Lot Help needed standing up from a chair using your arms (e.g., wheelchair or bedside chair)?: A Little Help needed to walk in hospital room?: Total Help needed climbing 3-5 steps with a railing? : Total 6 Click Score: 13    End of Session   Activity Tolerance: Patient tolerated treatment well Patient left: in bed;with call bell/phone within reach;with bed alarm set Nurse Communication: Mobility status PT Visit Diagnosis: Other abnormalities of gait and mobility (R26.89);Muscle weakness (generalized) (M62.81);Difficulty in walking, not elsewhere classified (R26.2)     Time: [id number] PT Time Calculation (min) (ACUTE ONLY): 26 min  Charges:  $Therapeutic Activity: 23-37 mins                     03/23/2020  Ginger Carne., PT Acute Rehabilitation Services [phone]  (pager) [phone]  (office)   Max Pittman 03/23/2020, 4:41 PM

## 2020-03-23 NOTE — Progress Notes (Signed)
Patient ID: Max Chute., male   DOB: 05-21-1975, 45 y.o.   MRN: [id number]  [facility] Progress Note   Assessment/ Plan:   1.  Acute hypoxic respiratory failure/ cardiac arrest: due to severe volume overload/pulmonary edema causing asystolic cardiac arrest. Resolved w/ CRRT.  2. ESRD: severe vol overload on admission. SP CRRT w/ net negative UF 31 L. Wt dropped 158 to 122 kg. Suspected membrane failure so we cannot do PD anymore. We are transitioning to HD. IR placed [place] on 2/23. PD cath removed 2/25 per gen surgery. Next HD due today.  4. Retroperitoneal hematoma: seen by CT 2/24 large R iliopsoas hematoma 10 x 7 x 20 cm. Anticoagulation on hold.  3. Anemia ckd: Hb in 8- 9 range, no esa given here > got darbe 100 ug on 2/24 + q wk on thursdays.  SP 1u prbc here.  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 Nutrition: On renal diet  6. Hypertension: no gross vol ^ on exam, holding wt's low 120's. Continues on norvasc/ metoprolol.    7. H/o VTE - a/c on hold d/t #3 8. H/o CVA/ hx seizures - taking lamictal and keppra 9. Dispo - Per PT needs SNF placement for rehab    [name], MD 03/23/2020, 10:26 AM    Subjective:   Pt seen on HD, no c/o's. Had PD cath removed yesterday.     Objective:   BP (!) 157/83   Pulse 90   Temp 98.6 F (37 C) (Oral)   Resp (!) 22   Ht '5\' 9"'$  (1.753 m)   Wt 123 kg   SpO2 95%   BMI 40.04 kg/m   Physical Exam: Gen: Resting comfortably in bed CVS: RRR no mrg   Resp: CTA, no rales or wheezing Abd: Soft, obese, nontender, pd cath removed Ext: no LE or UE edema  Thrombosed LUA AVF   R IJ TDC    Labs: BMET Recent Labs  Lab 03/17/20 0217 03/18/20 0128 03/19/20 0633 03/20/20 0044 03/21/20 1203 03/23/20 0733  NA 136 135 132* 133* 133* 135  K 4.3 3.5 4.1 3.7 3.8 4.1  CL 99 96* 96* 96* 96* 99  CO2 '22 23 23 22 '$ 21* 23  GLUCOSE 250* 257* 154* 156* 97 94  BUN 39* 46* 56* 59* 58* 36*  CREATININE  7.26* 7.91* 8.69* 9.24* 9.64* 8.41*  CALCIUM 9.9 9.5 9.4 9.1 8.7* 8.7*  PHOS 5.1* 5.4* 5.6*  --   --   --    CBC Recent Labs  Lab 03/20/20 0044 03/21/20 0108 03/22/20 0052 03/23/20 0149  WBC 16.6* 13.0* 10.9* 12.7*  NEUTROABS  --  8.9*  --   --   HGB 8.0* 7.7* 7.3* 8.8*  HCT 24.8* 24.7* 22.9* 28.9*  MCV 88.6 89.8 90.2 92.3  PLT 442* 457* 468* 499*     Medications:    . (feeding supplement) PROSource Plus  30 mL Oral BID WC  . amLODipine  5 mg Oral Daily  . aspirin  81 mg Oral Daily  . atorvastatin  40 mg Oral q1800  . buPROPion  150 mg Oral BID WC  . cephALEXin  250 mg Oral Q12H  . Chlorhexidine Gluconate Cloth  6 each Topical Daily  . Chlorhexidine Gluconate Cloth  6 each Topical Q0600  . [START ON 03/28/2020] darbepoetin (ARANESP) injection - DIALYSIS  100 mcg Intravenous Q Thu-HD  . docusate sodium  100 mg Oral BID  . feeding supplement  237 mL Oral BID BM  . FLUoxetine  10 mg Oral Daily  . insulin aspart  0-20 Units Subcutaneous Q4H  . isosorbide mononitrate  15 mg Oral Daily  . lamoTRIgine  100 mg Oral BID  . levETIRAcetam  500 mg Oral Daily  . loratadine  10 mg Oral Daily  . mouth rinse  15 mL Mouth Rinse BID  . melatonin  5 mg Oral QHS  . metoprolol tartrate  12.5 mg Oral BID  . multivitamin  1 tablet Oral QHS  . pantoprazole  40 mg Oral Daily  . polyethylene glycol  17 g Oral Daily  . pregabalin  50 mg Oral TID  . sevelamer carbonate  2,400 mg Oral TID WC  . sodium chloride flush  10-40 mL Intracatheter Q12H

## 2020-03-23 NOTE — Progress Notes (Signed)
[facility] PROGRESS NOTE    Progress Note  Max Pittman.  [id number] DOB: 08-09-1975 DOA: 03/07/2020 PCP: Chesley Noon, MD     Brief Narrative:   Max Pittman. is an 45 y.o. male past medical history of end-stage renal disease on peritoneal dialysis, diabetes mellitus type 2, essential hypertension, DVT on Coumadin sleep apnea comes into the hospital for shortness of breath on arrival to the ED had a cardiac arrest with 12 minutes of CPR before ROSC admitted to the ICU and intubated on 03/07/2020 self extubated on 01/08/2021 transferred to the hospitalist service on 01/11/2021, now requiring CRRT in the ICU.  Was transitioned to hemodialysis, nephrology and IR working on removing his temporary catheter and placing tunneled catheter for permanent dialysis and removing her peritoneal dialysis catheter.  Procedures: Tunneled catheter placed on 03/20/2020 by IR. Peritoneal catheter removal on 03/22/2020.  Significant studies: CT scan of the abdomen and pelvis done on 03/21/2020 showed large left psoas hematoma ~about 10 x 7 and extending 20 cm in length. Assessment/Plan:   Acute respiratory failure with hypoxia (HCC) leading to cardiac arrest: In the setting of fluid overload. Has been weaned to room air denies any shortness of breath. Physical therapy evaluated the patient recommended skilled nursing facility placement.  End-stage renal disease on peritoneal dialysis/hyperkalemia/hypomagnesemia: He still appears fluid overloaded on physical exam he has anasarca. On hemodialysis today. He status post HD catheter placement which seems to be working well. We will need to be set up as an outpatient for dialysis.  Spontaneous retroperitoneal iliopsoas hematoma Mild leukocytosis, T-max of 98.6, has remained afebrile. Continue to hold antibiotics. Off anticoagulation and antiplatelet therapy.  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 He was hospitalized at that time and completed 6 weeks of IV antibiotics now on suppressive therapy with Keflex. Blood cultures and fluid cultures were drawn on 03/19/2018 have remained negative till date.  Cardiogenic shock: Now resolved.  Obstructive sleep apnea: Noncompliant with CPAP.  Acute metabolic encephalopathy: Probably due to respiratory distress.  Deconditioning: Physical therapy evaluated the patient recommended skilled nursing facility continue to work with physical therapy.  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 We will discontinue all anticoagulation.  History of CVA/history of seizures: EEG on 03/09/2019 showed no seizure disorder continue Lamictal and Keppra.  Essential hypertension: Continue amlodipine and metoprolol. Add low-dose Imdur his blood pressure continues to be significantly elevated.  Normocytic anemia: Status post 1 unit of packed red blood cells his hemoglobin today is 8.6. Check a CBC in the morning.   DVT prophylaxis: lovenox Family Communication:none Status is: Inpatient  Remains inpatient appropriate because:Hemodynamically unstable   Dispo: The patient is from: Home              Anticipated d/c is to: SNF              Anticipated d/c date is: 1days              Patient currently is not medically stable to d/c.   Difficult to place patient No  Code Status:     Code Status Orders  (From admission, onward)         Start     Ordered   03/07/20 0440  Full code  Continuous  03/07/20 0441        Code Status History    Date Active Date Inactive Code Status Order ID Comments User Context   07/13/2019 2250 07/28/2019 2220 Full Code [id number]  Bethena Roys, MD Inpatient   07/01/2019 0254 07/06/2019 2206 Full Code [id number]  Edmonia Lynch,  DO ED   06/02/2019 1610 06/17/2019 1520 Full Code [id number]  Elizabeth Sauer Inpatient   06/02/2019 1610 06/02/2019 1610 Full Code [id number]  Elizabeth Sauer Inpatient   05/21/2019 0025 06/02/2019 1557 Full Code [id number]  Danne Baxter Inpatient   03/22/2019 [id number] 03/24/2019 1612 Full Code [id number]  Bethena Roys, MD Inpatient   Advance Care Planning Activity        IV Access:    Peripheral IV   Procedures and diagnostic studies:   CT ABDOMEN PELVIS W CONTRAST  Result Date: 03/21/2020 CLINICAL DATA:  Diffuse abdominal pain EXAM: CT ABDOMEN AND PELVIS WITH CONTRAST TECHNIQUE: Multidetector CT imaging of the abdomen and pelvis was performed using the standard protocol following bolus administration of intravenous contrast. Sagittal and coronal MPR images reconstructed from axial data set. CONTRAST:  148m OMNIPAQUE IOHEXOL 300 MG/ML SOLN IV. Dilute oral contrast. COMPARISON:  05/20/2019 FINDINGS: Lower chest: Bibasilar atelectasis and small pleural effusions greater on LEFT. Hepatobiliary: Gallbladder surgically absent.  Liver unremarkable. Pancreas: Normal appearance Spleen: Normal appearance Adrenals/Urinary Tract: Adrenal glands normal appearance. Marked BILATERAL renal cortical atrophy. Small BILATERAL renal cysts. No hydronephrosis, ureteral dilatation or definite urinary tract calcification. Peritoneal dialysis catheter present. Stomach/Bowel: Normal appendix. Stomach and bowel loops normal appearance. Vascular/Lymphatic: Scattered atherosclerotic calcifications aorta and iliac arteries. Aorta normal caliber. No adenopathy. Reproductive: Unremarkable prostate gland and seminal vesicles Other: Scattered free fluid consistent with peritoneal dialysis. No definite free air. Small umbilical hernia containing fat as well as a small focus of air, which could be related to the peritoneal dialysis catheter. LEFT flank hernia containing ascites. Musculoskeletal: Bones  demineralized. 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 Osteolysis of LEFT femoral head with fluid in the LEFT hip joint and superior subluxation of the proximal LEFT femur. Large RIGHT iliopsoas hematoma largest component measuring 10.0 x 7.6 cm in greatest axial dimensions and is 67 and extending for approximately 20 cm. Scattered subcutaneous edema at the flanks. IMPRESSION: Large RIGHT iliopsoas hematoma 10.0 x 7.6 in axial dimensions and extending 20 cm length. 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 Small umbilical hernia containing fat as well as a small focus of air, which could be related to the peritoneal dialysis catheter. Bibasilar atelectasis and small pleural effusions greater on LEFT. Marked BILATERAL renal cortical atrophy with small BILATERAL renal cysts consistent with end-stage renal disease. LEFT flank hernia containing ascites. Aortic Atherosclerosis (ICD10-I70.0). Findings called to Dr. FAileen Fasson 03/21/2020 at 1606 hours. Electronically Signed   By: MLavonia DanaM.D.   On: 03/21/2020 16:06     Medical Consultants:    None.   Subjective:    DFara Pittman back pain is improved breathing he relates he is at baseline.  Objective:    Vitals:   03/22/20 1601 03/22/20 1900 03/22/20 2320 03/23/20 0337  BP: (!) 191/89 (!) 155/73 (!) 152/71 (!) 179/81  Pulse: 100 99 95 100  Resp: '18 20 20 20  '$ Temp: 98.3 F (36.8 C) 98.5  F (36.9 C) 98.5 F (36.9 C) 98.2 F (36.8 C)  TempSrc: Oral Oral Oral Oral  SpO2: 100% 96% 92% 93%  Weight:      Height:       SpO2: 93 % O2 Flow Rate (L/min): 2 L/min FiO2 (%): 97 %   Intake/Output Summary (Last 24 hours) at 03/23/2020 0700 Last data filed at 03/22/2020 1900 Gross per 24 hour  Intake 967.92 ml  Output 520 ml  Net 447.92 ml    Filed Weights   03/21/20 0800 03/21/20 1500 03/21/20 1806  Weight: 123.5 kg 123.8 kg 121.3 kg    Exam: General exam: In no acute distress. Respiratory system: Good air movement and clear to auscultation. Cardiovascular system: S1 & S2 heard, RRR. No JVD. Gastrointestinal system: Abdomen is nondistended, soft and nontender.  Extremities: 2+ lower extremity edema Skin: No rashes, lesions or ulcers Psychiatry: Judgement and insight appear normal. Mood & affect appropriate.   Data Reviewed:    Labs: Basic Metabolic Panel: Recent Labs  Lab 03/17/20 0217 03/18/20 0128 03/19/20 [id number] 03/20/20 0044 03/21/20 1203  NA 136 135 132* 133* 133*  K 4.3 3.5 4.1 3.7 3.8  CL 99 96* 96* 96* 96*  CO2 '22 23 23 22 '$ 21*  GLUCOSE 250* 257* 154* 156* 97  BUN 39* 46* 56* 59* 58*  CREATININE 7.26* 7.91* 8.69* 9.24* 9.64*  CALCIUM 9.9 9.5 9.4 9.1 8.7*  MG 2.6* 2.3 2.4  --   --   PHOS 5.1* 5.4* 5.6*  --   --    GFR Estimated Creatinine Clearance: 12.7 mL/min (A) (by C-G formula based on SCr of 9.64 mg/dL (H)). Liver Function Tests: Recent Labs  Lab 03/17/20 0217 03/18/20 0128 03/19/20 [id number] 03/20/20 0044  AST  --   --   --  40  ALT  --   --   --  15  ALKPHOS  --   --   --  76  BILITOT  --   --   --  0.5  PROT  --   --   --  6.7  ALBUMIN 2.9* 2.5* 2.5* 2.2*   No results for input(s): LIPASE, AMYLASE in the last 168 hours. No results for input(s): AMMONIA in the last 168 hours. Coagulation profile Recent Labs  Lab 03/19/20 0633 03/20/20 0044 03/21/20 0108 03/22/20 0052 03/23/20 0149  INR 1.3* 1.3* 1.4* 1.6* 1.4*   COVID-19 Labs  No results for input(s): DDIMER, FERRITIN, LDH, CRP in the last 72 hours.  Lab Results  Component Value Date   SARSCOV2NAA NEGATIVE 03/07/2020   [place] NEGATIVE 07/13/2019   [place] NEGATIVE 06/30/2019   [place] NEGATIVE 05/20/2019    CBC: Recent Labs  Lab 03/19/20 0633 03/20/20 0044 03/21/20 0108 03/22/20 0052 03/23/20 0149   WBC 20.1* 16.6* 13.0* 10.9* 12.7*  NEUTROABS  --   --  8.9*  --   --   HGB 8.8* 8.0* 7.7* 7.3* 8.8*  HCT 27.3* 24.8* 24.7* 22.9* 28.9*  MCV 88.3 88.6 89.8 90.2 92.3  PLT 446* 442* 457* 468* 499*   Cardiac Enzymes: No results for input(s): CKTOTAL, CKMB, CKMBINDEX, TROPONINI in the last 168 hours. BNP (last 3 results) No results for input(s): PROBNP in the last 8760 hours. CBG: Recent Labs  Lab 03/22/20 1600 03/22/20 1954 03/23/20 0030 03/23/20 0109 03/23/20 0405  GLUCAP 84 86 73 70 177*   D-Dimer: No results for input(s): DDIMER in the last 72 hours. Hgb A1c: No results for input(s): HGBA1C in the last  72 hours. Lipid Profile: No results for input(s): CHOL, HDL, LDLCALC, TRIG, CHOLHDL, LDLDIRECT in the last 72 hours. Thyroid function studies: No results for input(s): TSH, T4TOTAL, T3FREE, THYROIDAB in the last 72 hours.  Invalid input(s): FREET3 Anemia work up: No results for input(s): VITAMINB12, FOLATE, FERRITIN, TIBC, IRON, RETICCTPCT in the last 72 hours. Sepsis Labs: Recent Labs  Lab 03/20/20 0044 03/21/20 0108 03/22/20 0052 03/23/20 0149  WBC 16.6* 13.0* 10.9* 12.7*   Microbiology Recent Results (from the past 240 hour(s))  Culture, blood (Routine X 2) w Reflex to ID Panel     Status: None (Preliminary result)   Collection Time: 03/19/20 10:30 AM   Specimen: BLOOD RIGHT HAND  Result Value Ref Range Status   Specimen Description BLOOD RIGHT HAND  Final   Special Requests   Final    BOTTLES DRAWN AEROBIC ONLY Blood Culture results may not be optimal due to an inadequate volume of blood received in culture bottles   Culture   Final    NO GROWTH 3 DAYS Performed at [facility], [place] [address] [place], [place] [address]    Report Status PENDING  Incomplete  Culture, blood (Routine X 2) w Reflex to ID Panel     Status: None (Preliminary result)   Collection Time: 03/19/20 10:41 AM   Specimen: BLOOD RIGHT WRIST  Result Value Ref Range Status    Specimen Description BLOOD RIGHT WRIST  Final   Special Requests   Final    BOTTLES DRAWN AEROBIC AND ANAEROBIC Blood Culture adequate volume   Culture   Final    NO GROWTH 3 DAYS Performed at [facility], [place] [address], [place], [place] [address]    Report Status PENDING  Incomplete  Body fluid culture w Gram Stain     Status: None (Preliminary result)   Collection Time: 03/20/20  8:12 AM   Specimen: Peritoneal Dialysate; Body Fluid  Result Value Ref Range Status   Specimen Description PERITONEAL DIALYSATE  Final   Special Requests Immunocompromised  Final   Gram Stain   Final    RARE WBC PRESENT, PREDOMINANTLY MONONUCLEAR NO ORGANISMS SEEN    Culture   Final    NO GROWTH 2 DAYS Performed at [facility], [address] [place], [place] [address]    Report Status PENDING  Incomplete     Medications:   . (feeding supplement) PROSource Plus  30 mL Oral BID WC  . amLODipine  5 mg Oral Daily  . aspirin  81 mg Oral Daily  . atorvastatin  40 mg Oral q1800  . buPROPion  150 mg Oral BID WC  . cephALEXin  250 mg Oral Q12H  . Chlorhexidine Gluconate Cloth  6 each Topical Daily  . Chlorhexidine Gluconate Cloth  6 each Topical Q0600  . docusate sodium  100 mg Oral BID  . feeding supplement  237 mL Oral BID BM  . insulin aspart  0-20 Units Subcutaneous Q4H  . lamoTRIgine  100 mg Oral BID  . levETIRAcetam  500 mg Oral Daily  . mouth rinse  15 mL Mouth Rinse BID  . melatonin  5 mg Oral QHS  . metoprolol tartrate  12.5 mg Oral BID  . multivitamin  1 tablet Oral QHS  . pantoprazole  40 mg Oral Daily  . polyethylene glycol  17 g Oral Daily  . sodium chloride flush  10-40 mL Intracatheter Q12H   Continuous Infusions:     LOS: 16  days   Charlynne Cousins  [facility]  03/23/2020, 7:00 AM

## 2020-03-23 NOTE — Progress Notes (Signed)
   03/23/20 0337  Vitals  Temp 98.2 F (36.8 C)  Temp Source Oral  BP (!) 179/81  MAP (mmHg) 110  BP Location Right Leg  BP Method Automatic  Patient Position (if appropriate) Lying  Pulse Rate 100  Pulse Rate Source Monitor  ECG Heart Rate 100  Resp 20   10 mg prn labetalol given per MD order

## 2020-03-24 DIAGNOSIS — J9601 Acute respiratory failure with hypoxia: Secondary | ICD-10-CM | POA: Diagnosis not present

## 2020-03-24 DIAGNOSIS — Z7901 Long term (current) use of anticoagulants: Secondary | ICD-10-CM | POA: Diagnosis not present

## 2020-03-24 DIAGNOSIS — G9340 Encephalopathy, unspecified: Secondary | ICD-10-CM | POA: Diagnosis not present

## 2020-03-24 DIAGNOSIS — N186 End stage renal disease: Secondary | ICD-10-CM | POA: Diagnosis not present

## 2020-03-24 LAB — GLUCOSE, CAPILLARY
Glucose-Capillary: 104 mg/dL — ABNORMAL HIGH (ref 70–99)
Glucose-Capillary: 111 mg/dL — ABNORMAL HIGH (ref 70–99)
Glucose-Capillary: 75 mg/dL (ref 70–99)
Glucose-Capillary: 84 mg/dL (ref 70–99)
Glucose-Capillary: 90 mg/dL (ref 70–99)
Glucose-Capillary: 92 mg/dL (ref 70–99)
Glucose-Capillary: 92 mg/dL (ref 70–99)

## 2020-03-24 LAB — CBC
HCT: 26.5 % — ABNORMAL LOW (ref 39.0–52.0)
Hemoglobin: 8.5 g/dL — ABNORMAL LOW (ref 13.0–17.0)
MCH: 29.4 pg (ref 26.0–34.0)
MCHC: 32.1 g/dL (ref 30.0–36.0)
MCV: 91.7 fL (ref 80.0–100.0)
Platelets: 431 10*3/uL — ABNORMAL HIGH (ref 150–400)
RBC: 2.89 MIL/uL — ABNORMAL LOW (ref 4.22–5.81)
RDW: 16.6 % — ABNORMAL HIGH (ref 11.5–15.5)
WBC: 11.2 10*3/uL — ABNORMAL HIGH (ref 4.0–10.5)
nRBC: 0.4 % — ABNORMAL HIGH (ref 0.0–0.2)

## 2020-03-24 LAB — CULTURE, BLOOD (ROUTINE X 2)
Culture: NO GROWTH
Culture: NO GROWTH
Special Requests: ADEQUATE

## 2020-03-24 LAB — PROTIME-INR
INR: 1.5 — ABNORMAL HIGH (ref 0.8–1.2)
Prothrombin Time: 17.7 seconds — ABNORMAL HIGH (ref 11.4–15.2)

## 2020-03-24 MED ORDER — PREGABALIN 75 MG PO CAPS
75.0000 mg | ORAL_CAPSULE | Freq: Every day | ORAL | Status: DC
Start: 2020-03-25 — End: 2020-03-26
  Administered 2020-03-25: 75 mg via ORAL
  Filled 2020-03-24: qty 1

## 2020-03-24 NOTE — Progress Notes (Signed)
2 Days Post-Op   Subjective/Chief Complaint: Complains only of some mild soreness and mild nausea   Objective: Vital signs in last 24 hours: Temp:  [97.8 F (36.6 C)-99 F (37.2 C)] 97.8 F (36.6 C) (02/27 0329) Pulse Rate:  [90-109] 92 (02/27 0800) Resp:  [15-24] 20 (02/27 0800) BP: (114-185)/(62-99) 185/86 (02/27 0800) SpO2:  [93 %-99 %] 95 % (02/27 0800) Weight:  [119.1 kg-120.2 kg] 119.1 kg (02/27 0630) Last BM Date: 03/23/20  Intake/Output from previous day: 02/26 0701 - 02/27 0700 In: 120 [P.O.:120] Out: 2529  Intake/Output this shift: No intake/output data recorded.  General appearance: alert and cooperative Resp: clear to auscultation bilaterally Cardio: regular rate and rhythm GI: soft, mild tenderness. incisions look good. good bs  Lab Results:  Recent Labs    03/23/20 0149 03/24/20 0140  WBC 12.7* 11.2*  HGB 8.8* 8.5*  HCT 28.9* 26.5*  PLT 499* 431*   BMET Recent Labs    03/21/20 1203 03/23/20 0733  NA 133* 135  K 3.8 4.1  CL 96* 99  CO2 21* 23  GLUCOSE 97 94  BUN 58* 36*  CREATININE 9.64* 8.41*  CALCIUM 8.7* 8.7*   PT/INR Recent Labs    03/23/20 0149 03/24/20 0140  LABPROT 16.8* 17.7*  INR 1.4* 1.5*   ABG No results for input(s): PHART, HCO3 in the last 72 hours.  Invalid input(s): PCO2, PO2  Studies/Results: No results found.  Anti-infectives: Anti-infectives (From admission, onward)   Start     Dose/Rate Route Frequency Ordered Stop   03/23/20 1000  cephALEXin (KEFLEX) capsule 250 mg        250 mg Oral Every 12 hours 03/22/20 0752     03/22/20 1000  cephALEXin (KEFLEX) capsule 250 mg  Status:  Discontinued        250 mg Oral Every 12 hours 03/21/20 0809 03/21/20 1103   03/22/20 0600  ceFAZolin (ANCEF) IVPB 1 g/50 mL premix        1 g 100 mL/hr over 30 Minutes Intravenous On call to O.R. 03/21/20 1053 03/22/20 1347   03/21/20 1800  cephALEXin (KEFLEX) capsule 250 mg  Status:  Discontinued        250 mg Oral Every 12 hours  03/21/20 1720 03/22/20 0752   03/21/20 1103  cephALEXin (KEFLEX) capsule 250 mg  Status:  Discontinued        250 mg Oral Every 12 hours 03/21/20 1103 03/21/20 1720   03/21/20 0900  ceFAZolin (ANCEF) IVPB 1 g/50 mL premix  Status:  Discontinued        1 g 100 mL/hr over 30 Minutes Intravenous On call to O.R. 03/21/20 0809 03/21/20 1053   03/21/20 0600  ceFAZolin (ANCEF) IVPB 2g/100 mL premix  Status:  Discontinued        2 g 200 mL/hr over 30 Minutes Intravenous On call to O.R. 03/20/20 1447 03/21/20 0809   03/20/20 0915  ceFAZolin (ANCEF) IVPB 2g/100 mL premix       Note to Pharmacy: Give in IR pre-procedure   2 g 200 mL/hr over 30 Minutes Intravenous  Once 03/20/20 0827 03/20/20 1515   03/19/20 0600  ceFAZolin (ANCEF) IVPB 2g/100 mL premix        2 g 200 mL/hr over 30 Minutes Intravenous  Once 03/18/20 1453 03/19/20 1947   03/12/20 2200  cephALEXin (KEFLEX) capsule 250 mg  Status:  Discontinued        250 mg Per Tube Every 12 hours 03/11/20 1030 03/12/20 1408  03/12/20 2200  cephALEXin (KEFLEX) capsule 250 mg  Status:  Discontinued        250 mg Oral Every 12 hours 03/12/20 1408 03/21/20 0809   03/07/20 1145  Ampicillin-Sulbactam (UNASYN) 3 g in sodium chloride 0.9 % 100 mL IVPB        3 g 200 mL/hr over 30 Minutes Intravenous Every 8 hours 03/07/20 1049 03/12/20 0718      Assessment/Plan: s/p Procedure(s) with comments: REMOVAL OF PERITONEAL DIALYSIS CATHETER (N/A) - ROOM 1 STATING AT 11:00AM FOR 60 MIN Advance diet  Hg 8.5 stable. Retroperitoneal hematoma HD per renal  LOS: 17 days    Max Pittman 03/24/2020

## 2020-03-24 NOTE — Progress Notes (Signed)
Patient ID: Max Pittman., male   DOB: 12/02/1975, 45 y.o.   MRN: [id number] [facility] Progress Note   Assessment/ Plan:   1.  Acute hypoxic respiratory failure/ cardiac arrest: due to severe volume overload/pulmonary edema causing asystolic cardiac arrest. Resolved w/ CRRT.  2. ESRD: severe vol overload on admission. Had CRRT w/ UF of 31 L. Suspected membrane failure so cannot do PD anymore. Transitioning to HD. IR placed [place] on 2/23. PD cath removed 2/25 per gen surgery. TTS HD here, next HD due 3/01.  3. Retroperitoneal hematoma: seen by CT 2/24 large R iliopsoas hematoma 10 x 7 x 20 cm. Anticoagulation on hold.  4. Anemia ckd: Hb in 8- 9 range, sp darbe 100 ug on 2/24 + q thurs going ahead.  SP 1u prbc here.  5. CKD-MBD: Calcium and phosphorus level within acceptable range 6. Nutrition: On renal diet  7. Hypertension: no gross vol ^ on exam, holding wt's high 110 kg's. Continues on norvasc/ metoprolol.    8. 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 H/o CVA/ hx seizures - taking lamictal and keppra 10. Dispo - Per PT needs SNF placement for rehab    [name], MD 03/24/2020, 2:32 PM    Subjective:   Pt seen in room, doing a bit better, abd pain improving. No c/o.    Objective:   BP (!) 145/84 (BP Location: Right Leg)   Pulse 91   Temp 98 F (36.7 C) (Oral)   Resp 17   Ht '5\' 9"'$  (1.753 m)   Wt 119.1 kg   SpO2 99%   BMI 38.77 kg/m   Physical Exam: Gen: Resting comfortably in bed CVS: RRR no mrg   Resp: CTA, no rales or wheezing Abd: Soft, obese, nontender, pd cath removed Ext: no LE or UE edema  Thrombosed LUA AVF   R IJ [facility]    Labs: BMET Recent Labs  Lab 03/18/20 0128 03/19/20 0633 03/20/20 0044 03/21/20 1203 03/23/20 0733  NA 135 132* 133* 133* 135  K 3.5 4.1 3.7 3.8 4.1  CL 96* 96* 96* 96* 99  CO2 '23 23 22 '$ 21* 23  GLUCOSE 257* 154* 156* 97 94  BUN 46* 56* 59* 58* 36*  CREATININE 7.91* 8.69* 9.24* 9.64* 8.41*  CALCIUM 9.5 9.4 9.1 8.7*  8.7*  PHOS 5.4* 5.6*  --   --   --    CBC Recent Labs  Lab 03/21/20 0108 03/22/20 0052 03/23/20 0149 03/24/20 0140  WBC 13.0* 10.9* 12.7* 11.2*  NEUTROABS 8.9*  --   --   --   HGB 7.7* 7.3* 8.8* 8.5*  HCT 24.7* 22.9* 28.9* 26.5*  MCV 89.8 90.2 92.3 91.7  PLT 457* 468* 499* 431*     Medications:    . (feeding supplement) PROSource Plus  30 mL Oral BID WC  . amLODipine  5 mg Oral Daily  . atorvastatin  40 mg Oral q1800  . buPROPion  150 mg Oral BID WC  . cephALEXin  250 mg Oral Q12H  . Chlorhexidine Gluconate Cloth  6 each Topical Daily  . Chlorhexidine Gluconate Cloth  6 each Topical Q0600  . [START ON 03/28/2020] darbepoetin (ARANESP) injection - DIALYSIS  100 mcg Intravenous Q Thu-HD  . docusate sodium  100 mg Oral BID  . feeding supplement  237 mL Oral BID BM  . FLUoxetine  10 mg Oral Daily  . insulin aspart  0-20 Units Subcutaneous Q4H  . isosorbide mononitrate  15 mg Oral Daily  . lamoTRIgine  100 mg Oral BID  . levETIRAcetam  500 mg Oral Daily  . loratadine  10 mg Oral Daily  . mouth rinse  15 mL Mouth Rinse BID  . melatonin  5 mg Oral QHS  . metoprolol tartrate  12.5 mg Oral BID  . multivitamin  1 tablet Oral QHS  . pantoprazole  40 mg Oral Daily  . polyethylene glycol  17 g Oral Daily  . [START ON 03/25/2020] pregabalin  75 mg Oral QHS  . sevelamer carbonate  2,400 mg Oral TID WC  . sodium chloride flush  10-40 mL Intracatheter Q12H

## 2020-03-24 NOTE — Progress Notes (Signed)
[facility] PROGRESS NOTE    Progress Note  Max Pittman.  [id number] DOB: 04-Dec-1975 DOA: 03/07/2020 PCP: Chesley Noon, MD     Brief Narrative:   Max Pittman. is an 45 y.o. male past medical history of end-stage renal disease on peritoneal dialysis, diabetes mellitus type 2, essential hypertension, DVT on Coumadin sleep apnea comes into the hospital for shortness of breath on arrival to the ED had a cardiac arrest with 12 minutes of CPR before ROSC admitted to the ICU and intubated on 03/07/2020 self extubated on 01/08/2021 transferred to the hospitalist service on 01/11/2021, now requiring CRRT in the ICU.  Was transitioned to hemodialysis, nephrology and IR working on removing his temporary catheter and placing tunneled catheter for permanent dialysis and removing her peritoneal dialysis catheter.  Procedures: Tunneled catheter placed on 03/20/2020 by IR. Peritoneal catheter removal on 03/22/2020.  Significant studies: CT scan of the abdomen and pelvis done on 03/21/2020 showed large left psoas hematoma ~about 10 x 7 and extending 20 cm in length. Assessment/Plan:   Acute respiratory failure with hypoxia (HCC) leading to cardiac arrest: In the setting of fluid overload, now resolved. Has been weaned to room air denies any shortness of breath. Physical therapy evaluated the patient who recommended inpatient rehab.  End-stage renal disease on peritoneal dialysis/hyperkalemia/hypomagnesemia: He still appears fluid overloaded on physical exam he has anasarca. Has significantly drop compared to when admission 158kg he weighs 119 kg. Last hemodialysis treatment on 01/20/2021. HD catheter has been working well will need to be set up in clips as an outpatient for dialysis.  Spontaneous retroperitoneal iliopsoas hematoma Leukocytosis slowly improving, T-max of 9, has remained afebrile. Continue to hold antibiotics.  Hemoglobin has stabilized continue to check  daily. Leukocytosis likely reactive due to retroperitoneal hematoma. Off anticoagulation antiplatelet therapy he is no longer candidate for anticoagulation due to his spontaneous retroperitoneal hematoma bleed.  See below for further details.  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 He was hospitalized at that time and completed 6 weeks of IV antibiotics now on suppressive therapy with Keflex. Blood cultures and fluid cultures were drawn on 03/19/2018 have remained negative till date.  Cardiogenic shock: Now resolved.  Obstructive sleep apnea: Noncompliant with CPAP.  Acute metabolic encephalopathy: Probably due to respiratory distress.  Deconditioning: Physical therapy evaluated the patient recommended skilled nursing facility continue to work with physical therapy.  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 We will discontinue all anticoagulation.  History of CVA/history of seizures: EEG on 03/09/2019 showed no seizure disorder continue Lamictal and Keppra.  Essential hypertension: Continue amlodipine and metoprolol. Add low-dose Imdur his blood pressure continues to be significantly elevated.  Normocytic anemia: Status post 1 unit of packed red blood cells his hemoglobin today is 8.6. Check a CBC in the morning.   DVT prophylaxis: lovenox Family Communication:none Status is: Inpatient  Remains inpatient appropriate because:Hemodynamically unstable   Dispo: The patient is from: Home              Anticipated d/c is to: SNF              Anticipated d/c date is: 1days              Patient currently is not medically stable to d/c.  Difficult to place patient No  Code Status:     Code Status Orders  (From admission, onward)         Start     Ordered    03/07/20 0440  Full code  Continuous        03/07/20 0441        Code Status History    Date Active Date Inactive Code Status Order ID Comments User Context   07/13/2019 2250 07/28/2019 2220 Full Code [id number]  Bethena Roys, MD Inpatient   07/01/2019 0254 07/06/2019 2206 Full Code [id number]  Edmonia Lynch, DO ED   06/02/2019 1610 06/17/2019 1520 Full Code [id number]  Elizabeth Sauer Inpatient   06/02/2019 1610 06/02/2019 1610 Full Code [id number]  Elizabeth Sauer Inpatient   05/21/2019 0025 06/02/2019 1557 Full Code [id number]  Danne Baxter Inpatient   03/22/2019 [id number] 03/24/2019 1612 Full Code [id number]  Bethena Roys, MD Inpatient   Advance Care Planning Activity        IV Access:    Peripheral IV   Procedures and diagnostic studies:   No results found.   Medical Consultants:    None.   Subjective:    Max Pittman. relates his back pain is improved  Objective:    Vitals:   03/23/20 2018 03/23/20 2305 03/24/20 0329 03/24/20 0630  BP: (!) 152/82 114/79 136/62   Pulse: (!) 109 97 94 95  Resp: '15 20 19 '$ (!) 24  Temp: 98.3 F (36.8 C) 97.8 F (36.6 C) 97.8 F (36.6 C)   TempSrc: Oral Oral Oral   SpO2: 99% 93% 96% 95%  Weight:    119.1 kg  Height:       SpO2: 95 % O2 Flow Rate (L/min): 2 L/min FiO2 (%): 97 %   Intake/Output Summary (Last 24 hours) at 03/24/2020 0729 Last data filed at 03/23/2020 2018 Gross per 24 hour  Intake 120 ml  Output 2529 ml  Net -2409 ml   Filed Weights   03/23/20 0706 03/23/20 1035 03/24/20 0630  Weight: 123 kg 120.2 kg 119.1 kg    Exam: General exam: In no acute distress. Respiratory system: Good air movement and clear to auscultation. Cardiovascular system: S1 & S2 heard, RRR. No JVD. Gastrointestinal system: Abdomen is nondistended, soft and nontender.  Extremities: No pedal edema. Skin: No rashes, lesions or ulcers Psychiatry: Judgement and insight appear normal. Mood & affect  appropriate.   Data Reviewed:    Labs: Basic Metabolic Panel: Recent Labs  Lab 03/02/2020 0128 03/19/20 0633 03/20/20 0044 03/21/20 1203 03/23/20 0733  NA 135 132* 133* 133* 135  K 3.5 4.1 3.7 3.8 4.1  CL 96* 96* 96* 96* 99  CO2 '23 23 22 '$ 21* 23  GLUCOSE 257* 154* 156* 97 94  BUN 46* 56* 59* 58* 36*  CREATININE 7.91* 8.69* 9.24* 9.64* 8.41*  CALCIUM 9.5 9.4 9.1 8.7* 8.7*  MG 2.3 2.4  --   --   --   PHOS 5.4* 5.6*  --   --   --    GFR Estimated Creatinine Clearance: 14.4 mL/min (A) (by C-G formula based on SCr of 8.41 mg/dL (H)). Liver Function Tests: Recent Labs  Lab 03/25/2020 0128 03/19/20 [id number] 03/20/20 0044 03/23/20 0733  AST  --   --  40 19  ALT  --   --  15 5  ALKPHOS  --   --  76 73  BILITOT  --   --  0.5 0.6  PROT  --   --  6.7 6.2*  ALBUMIN 2.5* 2.5* 2.2* 1.9*   No results for input(s): LIPASE, AMYLASE in the last 168 hours. No results for input(s): AMMONIA in the last 168 hours. Coagulation profile Recent Labs  Lab 03/20/20 0044 03/21/20 0108 03/22/20 0052 03/23/20 0149 03/24/20 0140  INR 1.3* 1.4* 1.6* 1.4* 1.5*   COVID-19 Labs  No results for input(s): DDIMER, FERRITIN, LDH, CRP in the last 72 hours.  Lab Results  Component Value Date   SARSCOV2NAA NEGATIVE 03/07/2020   [place] NEGATIVE 07/13/2019   [place] NEGATIVE 06/30/2019   [place] NEGATIVE 05/20/2019    CBC: Recent Labs  Lab 03/20/20 0044 03/21/20 0108 03/22/20 0052 03/23/20 0149 03/24/20 0140  WBC 16.6* 13.0* 10.9* 12.7* 11.2*  NEUTROABS  --  8.9*  --   --   --   HGB 8.0* 7.7* 7.3* 8.8* 8.5*  HCT 24.8* 24.7* 22.9* 28.9* 26.5*  MCV 88.6 89.8 90.2 92.3 91.7  PLT 442* 457* 468* 499* 431*   Cardiac Enzymes: No results for input(s): CKTOTAL, CKMB, CKMBINDEX, TROPONINI in the last 168 hours. BNP (last 3 results) No results for input(s): PROBNP in the last 8760 hours. CBG: Recent Labs  Lab 03/23/20 1134 03/23/20 1811 03/23/20 2021 03/24/20 0030 03/24/20 0328   GLUCAP 94 116* 93 92 111*   D-Dimer: No results for input(s): DDIMER in the last 72 hours. Hgb A1c: No results for input(s): HGBA1C in the last 72 hours. Lipid Profile: No results for input(s): CHOL, HDL, LDLCALC, TRIG, CHOLHDL, LDLDIRECT in the last 72 hours. Thyroid function studies: No results for input(s): TSH, T4TOTAL, T3FREE, THYROIDAB in the last 72 hours.  Invalid input(s): FREET3 Anemia work up: No results for input(s): VITAMINB12, FOLATE, FERRITIN, TIBC, IRON, RETICCTPCT in the last 72 hours. Sepsis Labs: Recent Labs  Lab 03/21/20 0108 03/22/20 0052 03/23/20 0149 03/24/20 0140  WBC 13.0* 10.9* 12.7* 11.2*   Microbiology Recent Results (from the past 240 hour(s))  Culture, blood (Routine X 2) w Reflex to ID Panel     Status: None (Preliminary result)   Collection Time: 03/19/20 10:30 AM   Specimen: BLOOD RIGHT HAND  Result Value Ref Range Status   Specimen Description BLOOD RIGHT HAND  Final   Special Requests   Final    BOTTLES DRAWN AEROBIC ONLY Blood Culture results may not be optimal due to an inadequate volume of blood received in culture bottles   Culture   Final    NO GROWTH 4 DAYS Performed at [facility], [place] [address], [place], [place] [address]    Report Status PENDING  Incomplete  Culture, blood (Routine X 2) w Reflex to ID Panel     Status: None (Preliminary result)   Collection Time: 03/19/20 10:41 AM   Specimen: BLOOD RIGHT WRIST  Result Value Ref Range Status   Specimen Description BLOOD RIGHT WRIST  Final   Special Requests   Final    BOTTLES DRAWN AEROBIC AND ANAEROBIC Blood Culture adequate volume   Culture   Final    NO GROWTH 4 DAYS Performed at [facility], [place] [address], [place], [place] [address]    Report Status PENDING  Incomplete  Body fluid culture w Gram Stain     Status: None   Collection Time: 03/20/20  8:12 AM   Specimen: Peritoneal Dialysate; Body Fluid  Result Value Ref Range Status   Specimen  Description PERITONEAL DIALYSATE  Final   Special  Requests Immunocompromised  Final   Gram Stain   Final    RARE WBC PRESENT, PREDOMINANTLY MONONUCLEAR NO ORGANISMS SEEN    Culture   Final    NO GROWTH 3 DAYS Performed at [facility], [place] [address], [place], [place] [address]    Report Status 03/23/2020 FINAL  Final     Medications:   . (feeding supplement) PROSource Plus  30 mL Oral BID WC  . amLODipine  5 mg Oral Daily  . aspirin  81 mg Oral Daily  . atorvastatin  40 mg Oral q1800  . buPROPion  150 mg Oral BID WC  . cephALEXin  250 mg Oral Q12H  . Chlorhexidine Gluconate Cloth  6 each Topical Daily  . Chlorhexidine Gluconate Cloth  6 each Topical Q0600  . [START ON 03/28/2020] darbepoetin (ARANESP) injection - DIALYSIS  100 mcg Intravenous Q Thu-HD  . docusate sodium  100 mg Oral BID  . feeding supplement  237 mL Oral BID BM  . FLUoxetine  10 mg Oral Daily  . insulin aspart  0-20 Units Subcutaneous Q4H  . isosorbide mononitrate  15 mg Oral Daily  . lamoTRIgine  100 mg Oral BID  . levETIRAcetam  500 mg Oral Daily  . loratadine  10 mg Oral Daily  . mouth rinse  15 mL Mouth Rinse BID  . melatonin  5 mg Oral QHS  . metoprolol tartrate  12.5 mg Oral BID  . multivitamin  1 tablet Oral QHS  . pantoprazole  40 mg Oral Daily  . polyethylene glycol  17 g Oral Daily  . pregabalin  50 mg Oral TID  . sevelamer carbonate  2,400 mg Oral TID WC  . sodium chloride flush  10-40 mL Intracatheter Q12H   Continuous Infusions:     LOS: 17 days   Charlynne Cousins  [facility]  03/24/2020, 7:29 AM

## 2020-03-25 ENCOUNTER — Encounter (HOSPITAL_COMMUNITY): Payer: Self-pay | Admitting: General Surgery

## 2020-03-25 DIAGNOSIS — N186 End stage renal disease: Secondary | ICD-10-CM | POA: Diagnosis not present

## 2020-03-25 DIAGNOSIS — J9601 Acute respiratory failure with hypoxia: Secondary | ICD-10-CM | POA: Diagnosis not present

## 2020-03-25 DIAGNOSIS — Z7901 Long term (current) use of anticoagulants: Secondary | ICD-10-CM | POA: Diagnosis not present

## 2020-03-25 DIAGNOSIS — G9340 Encephalopathy, unspecified: Secondary | ICD-10-CM | POA: Diagnosis not present

## 2020-03-25 LAB — CBC
HCT: 29 % — ABNORMAL LOW (ref 39.0–52.0)
Hemoglobin: 8.7 g/dL — ABNORMAL LOW (ref 13.0–17.0)
MCH: 27.9 pg (ref 26.0–34.0)
MCHC: 30 g/dL (ref 30.0–36.0)
MCV: 92.9 fL (ref 80.0–100.0)
Platelets: 414 10*3/uL — ABNORMAL HIGH (ref 150–400)
RBC: 3.12 MIL/uL — ABNORMAL LOW (ref 4.22–5.81)
RDW: 16.5 % — ABNORMAL HIGH (ref 11.5–15.5)
WBC: 10.4 10*3/uL (ref 4.0–10.5)
nRBC: 0.2 % (ref 0.0–0.2)

## 2020-03-25 LAB — GLUCOSE, CAPILLARY
Glucose-Capillary: 111 mg/dL — ABNORMAL HIGH (ref 70–99)
Glucose-Capillary: 95 mg/dL (ref 70–99)
Glucose-Capillary: 126 mg/dL — ABNORMAL HIGH (ref 70–99)
Glucose-Capillary: 105 mg/dL — ABNORMAL HIGH (ref 70–99)
Glucose-Capillary: 108 mg/dL — ABNORMAL HIGH (ref 70–99)
Glucose-Capillary: 135 mg/dL — ABNORMAL HIGH (ref 70–99)

## 2020-03-25 LAB — PROTIME-INR
INR: 1.4 — ABNORMAL HIGH (ref 0.8–1.2)
Prothrombin Time: 16.6 seconds — ABNORMAL HIGH (ref 11.4–15.2)

## 2020-03-25 MED ORDER — METOPROLOL TARTRATE 12.5 MG HALF TABLET
12.5000 mg | ORAL_TABLET | Freq: Two times a day (BID) | ORAL | Status: DC
Start: 2020-03-25 — End: 2020-03-26
  Administered 2020-03-25 – 2020-03-26 (×2): 12.5 mg via ORAL
  Filled 2020-03-25 (×2): qty 1

## 2020-03-25 MED ORDER — LEVETIRACETAM 500 MG PO TABS
500.0000 mg | ORAL_TABLET | Freq: Every day | ORAL | Status: DC
  Administered 2020-03-26: 500 mg via ORAL
  Filled 2020-03-25: qty 1

## 2020-03-25 MED ORDER — LAMOTRIGINE 25 MG PO TABS
100.0000 mg | ORAL_TABLET | Freq: Two times a day (BID) | ORAL | Status: DC
Start: 2020-03-25 — End: 2020-03-26
  Administered 2020-03-25 – 2020-03-26 (×2): 100 mg via ORAL
  Filled 2020-03-25 (×3): qty 4

## 2020-03-25 MED ORDER — NEPRO/CARBSTEADY PO LIQD
237.0000 mL | Freq: Three times a day (TID) | ORAL | Status: DC
  Administered 2020-03-25: 237 mL via ORAL

## 2020-03-25 MED ORDER — CEPHALEXIN 250 MG PO CAPS
250.0000 mg | ORAL_CAPSULE | Freq: Two times a day (BID) | ORAL | Status: DC
Start: 2020-03-25 — End: 2020-03-26
  Administered 2020-03-25 – 2020-03-26 (×2): 250 mg via ORAL
  Filled 2020-03-25 (×4): qty 1

## 2020-03-25 MED ORDER — CHLORHEXIDINE GLUCONATE CLOTH 2 % EX PADS: 6.0000 | MEDICATED_PAD | Freq: Every day | CUTANEOUS | Status: DC

## 2020-03-25 MED ORDER — FLUOXETINE HCL 10 MG PO CAPS
10.0000 mg | ORAL_CAPSULE | Freq: Every day | ORAL | Status: DC
  Administered 2020-03-26: 10 mg via ORAL
  Filled 2020-03-25: qty 1

## 2020-03-25 MED ORDER — AMLODIPINE BESYLATE 5 MG PO TABS
5.0000 mg | ORAL_TABLET | Freq: Every day | ORAL | Status: DC
  Administered 2020-03-26: 5 mg via ORAL
  Filled 2020-03-25: qty 1

## 2020-03-25 NOTE — Progress Notes (Signed)
Inpatient Rehab Admissions Coordinator:   Met with patient at bedside.  Now agreeable to short term rehab and therapy recommending SNF.  Will follow for timing of possible admission pending bed availability this week.   Shann Medal, PT, DPT Admissions Coordinator [phone] 03/25/20  3:56 PM

## 2020-03-25 NOTE — Progress Notes (Signed)
Renal Navigator met with patient at bedside to offer support (patient well known to Navigator from previous hospitalizations) and discuss modality change. He states plan is now to transfer to CIR. Navigator will update outpatient clinic. Patient states he needs schedule that will get him home by 2:30pm in order to accommodate his transportation as his family shares a car.   Alphonzo Cruise, [place] Renal Navigator [phone]

## 2020-03-25 NOTE — Progress Notes (Signed)
Renal Navigator spoke with patient's outpatient clinic/[name] to request update on in-center seat due to need for permanent modality change. Per Dr. Jonnie Finner, he has spoken with patient's outpatient Nephrologist prior regarding need for seat. Navigator unsure of disposition as PT is recommending SNF, but it appears patient is refusing. Navigator will follow up with TOC.   Max Pittman, [place] Renal Navigator [phone]

## 2020-03-25 NOTE — Progress Notes (Signed)
Nutrition Follow-up  DOCUMENTATION CODES:   Obesity unspecified  INTERVENTION:    Nepro Shake po TID, each supplement provides 425 kcal and 19 grams protein  Renal MVI daily   NUTRITION DIAGNOSIS:   Inadequate oral intake related to poor appetite as evidenced by per patient/family report.  Progressing   GOAL:   Patient will meet greater than or equal to 90% of their needs   Progressing   MONITOR:   PO intake,Skin,Supplement acceptance,I & O's,Labs,Weight trends,Diet advancement  REASON FOR ASSESSMENT:   Ventilator,Consult Enteral/tube feeding initiation and management  ASSESSMENT:   45 yo male admitted post cardiac arrest with acute respiratory failure secondary to acute pulmonary edema requiring intubation PMH includes ESRD on PD, DM, HTN, OSA   2/10- intubated 2/14- self extubated  2/23- s/p TDC placement  2/25- s/p removal of PD catheter   Next HD on 3/1. Briefly discussed diet moving forward with HD. Provided handout in d/c instructions. Of note patient interested in home HD. Plan d/c to CIR prior to home.   Appetite stable per patient. Last meal completion charted as 100%. Taking a mix of Ensure and Nepro. Willing to do Nepro for more kcal and protein.   Admission weight: 130 kg  Current weight: 119 kg   UOP: 400 ml x 24 hrs   Medications: aranesp, colace, miralax, renvela Labs: CBG 84-116  Diet Order:   Diet Order            Diet Carb Modified Fluid consistency: Thin; Room service appropriate? Yes  Diet effective now                 EDUCATION NEEDS:   No education needs have been identified at this time  Skin:  Skin Assessment: Skin Integrity Issues: Skin Integrity Issues:: Other (Comment) Other: pressure injury head  Last BM:  2/26  Height:   Ht Readings from Last 1 Encounters:  03/07/20 '5\' 9"'$  (1.753 m)    Weight:   Wt Readings from Last 1 Encounters:  03/24/20 119.1 kg    Ideal Body Weight:  72.7 kg  BMI:  Body mass  index is 38.77 kg/m.  Estimated Nutritional Needs:   Kcal:  2200-2400 kcals  Protein:  110-140 g  Fluid:  1.8 L  Mariana Single RD, LDN Clinical Nutrition Pager listed in [place]

## 2020-03-25 NOTE — Progress Notes (Signed)
Occupational Therapy Treatment Patient Details Name: Max Pittman. MRN: [id number] DOB: 08-Aug-1975 Today's Date: 03/25/2020    History of present illness Max Pittman. is a 45 y.o. male who during early AM hours 2/10,  had worsening dyspnea at home.  Had apparently been experiencing dyspnea and trying to manage it with home peritoneal dialysis.  EMS was called and he initially refused transport to ED; however, after their arrival, he had a cardiac arrest.  Initial rhythm was asystole and he required 12 minutes before ROSC. Pt intubated 2/10, self extubated at 2/14 at midnight.  Also on CRRT.  PMH: including but not limited to ESRD on PD, DM, NSTEMI, HTN, DVT on warfarin, OSA.   OT comments  Pt making steady progress towards OT goals this session. Pt continues to present with increased pain ( mostly in RLE), BLE weakness and decreased coordination in BLEs, however pt able to progress OOB to recliner this session with MIN A +2 and RW. Pt additionally able to complete side steps to [facility] and towards end of bed with MINA +2 for balance. Updated DC recs to CIR to reflect pt progression. Pt would continue to benefit from skilled occupational therapy while admitted and after d/c to address the below listed limitations in order to improve overall functional mobility and facilitate independence with BADL participation. DC plan remains appropriate, will follow acutely per POC.     Follow Up Recommendations  CIR    Equipment Recommendations  3 in 1 bedside commode;Wheelchair (measurements OT);Wheelchair cushion (measurements OT)    Recommendations for Other Services Rehab consult    Precautions / Restrictions Precautions Precautions: Fall Precaution Comments: had fall 2/22 Restrictions Weight Bearing Restrictions: No       Mobility Bed Mobility Overal bed mobility: Needs Assistance Bed Mobility: Supine to Sit     Supine to sit: HOB elevated;Supervision     General bed mobility  comments: OOB on entry    Transfers Overall transfer level: Needs assistance Equipment used: Rolling walker (2 wheeled) Transfers: Sit to/from Stand;Lateral/Scoot Transfers Sit to Stand: Min assist (x2 from recliner) Stand pivot transfers: Min assist;+2 physical assistance       General transfer comment: min A for power up, vc for hand placement, and hip rotation and knee extension to achieve fully upright, on second bout of standing pt with increased nausea, requesting nausea medication    Balance Overall balance assessment: Needs assistance Sitting-balance support: No upper extremity supported Sitting balance-Leahy Scale: Fair Sitting balance - Comments: able to maintain balance without UE assist   Standing balance support: Bilateral upper extremity supported;Single extremity supported;During functional activity Standing balance-Leahy Scale: Poor Standing balance comment: 2 long standing trials with marching in place and L foot flat on floor requiring unlocking R knee to achieve.   Worked on standing technique for for efficient stand.                           ADL either performed or assessed with clinical judgement   ADL Overall ADL's : Needs assistance/impaired     Grooming: Oral care;Sitting;Supervision/safety;Set up Grooming Details (indicate cue type and reason): from recliner         Upper Body Dressing : Minimal assistance;Sitting Upper Body Dressing Details (indicate cue type and reason): to don posterior gown Lower Body Dressing: Total assistance;Bed level Lower Body Dressing Details (indicate cue type and reason): to don socks from bed level Toilet Transfer: Minimal assistance;RW;Stand-pivot;+2 for  physical assistance Toilet Transfer Details (indicate cue type and reason): simulated via simulated toilet transfer from EOB>recliner with RW and min A +2.         Functional mobility during ADLs: Minimal assistance;+2 for physical assistance;Rolling  walker General ADL Comments: pt with improvements in pain and requries less assist for functional mobility this session,pt able to progress OOB to recliner this session for ADL participation     Vision       Perception     Praxis      Cognition Arousal/Alertness: Awake/alert Behavior During Therapy: WFL for tasks assessed/performed Overall Cognitive Status: Within Functional Limits for tasks assessed                                          Exercises  Other Exercises Other Exercises: ankle pumps from supine BLEs x10 reps Other Exercises: LAQ BLEs from sitting EOB x10 reps Other Exercises: seated marching BLEs from EOB x10 reps   Shoulder Instructions       General Comments VSS    Pertinent Vitals/ Pain       Pain Assessment: Faces Faces Pain Scale: Hurts a little bit Pain Location: R knee/hip>L knee Pain Descriptors / Indicators: Grimacing;Guarding;Discomfort Pain Intervention(s): Limited activity within patient's tolerance;Monitored during session;Repositioned  Home Living                                          Prior Functioning/Environment              Frequency  Min 2X/week        Progress Toward Goals  OT Goals(current goals can now be found in the care plan section)  Progress towards OT goals: Progressing toward goals  Acute Rehab OT Goals Patient Stated Goal: to get stronger and maybe rehab before home with [facility] OT Goal Formulation: With patient Time For Goal Achievement: 03/26/20 Potential to Achieve Goals: Good  Plan Discharge plan needs to be updated;Frequency needs to be updated    Co-evaluation                 AM-PAC OT "6 Clicks" Daily Activity     Outcome Measure   Help from another person eating meals?: None Help from another person taking care of personal grooming?: A Little Help from another person toileting, which includes using toliet, bedpan, or urinal?: A Lot Help from another  person bathing (including washing, rinsing, drying)?: A Lot Help from another person to put on and taking off regular upper body clothing?: A Little Help from another person to put on and taking off regular lower body clothing?: A Lot 6 Click Score: 16    End of Session Equipment Utilized During Treatment: Gait belt;Rolling walker  OT Visit Diagnosis: Unsteadiness on feet (R26.81);Other abnormalities of gait and mobility (R26.89);Muscle weakness (generalized) (M62.81);Pain Pain - Right/Left: Right Pain - part of body: Leg   Activity Tolerance Patient tolerated treatment well   Patient Left in chair;with call bell/phone within reach   Nurse Communication Mobility status;Other (comment) (stedy for back to bed)        Time: [id number] OT Time Calculation (min): 19 min  Charges: OT General Charges $OT Visit: 1 Visit OT Treatments $Therapeutic Activity: 8-22 mins [place]., COTA/L Acute Rehabilitation Services [id number]  Corinne Ports [facility] 03/25/2020, 4:37 PM

## 2020-03-25 NOTE — Progress Notes (Signed)
Physical Therapy Treatment Patient Details Name: Max Pittman. MRN: [id number] DOB: 09-11-1975 Today's Date: 03/25/2020    History of Present Illness Max Pittman. is a 45 y.o. male who during early AM hours 2/10,  had worsening dyspnea at home.  Had apparently been experiencing dyspnea and trying to manage it with home peritoneal dialysis.  EMS was called and he initially refused transport to ED; however, after their arrival, he had a cardiac arrest.  Initial rhythm was asystole and he required 12 minutes before ROSC. Pt intubated 2/10, self extubated at 2/14 at midnight.  Also on CRRT.  PMH: including but not limited to ESRD on PD, DM, NSTEMI, HTN, DVT on warfarin, OSA.    PT Comments    Pt sitting up in recliner, agreeable to working with therapy. Discussed goal of working in [place] and pt reported he felt safer in the [place]. Discussed understandable reluctance due to prior falls and the security the [facility] offers by blocking knee buckling. Pt states he understands he needs to progress. Assured him we would work in [facility] and he could sit if he needed to. Pt min A for power up to standing in RW x2. Pt able to tolerate for approximately 5 minutes before fatigue and needing to sit. Pt performed seated exercise during rest break. On second bout of standing pt with c/o nausea and request sitting. Pt request nausea medication and RN notified. PT continues to recommend CIR level rehab prior to discharge home. PT will continue to follow acutely.    Follow Up Recommendations  CIR     Equipment Recommendations  Rolling walker with 5" wheels;3in1 (PT)    Recommendations for Other Services Rehab consult     Precautions / Restrictions Precautions Precautions: Fall Precaution Comments: had fall 2/22 Restrictions Weight Bearing Restrictions: No    Mobility  Bed Mobility Overal bed mobility: Needs Assistance Bed Mobility: Supine to Sit     Supine to sit: HOB  elevated;Supervision     General bed mobility comments: OOB on entry    Transfers Overall transfer level: Needs assistance Equipment used: Rolling walker (2 wheeled) Transfers: Sit to/from Stand;Lateral/Scoot Transfers Sit to Stand: Min assist (x2 from recliner) Stand pivot transfers: Min assist;+2 physical assistance       General transfer comment: min A for power up, vc for hand placement, and hip rotation and knee extension to achieve fully upright, on second bout of standing pt with increased nausea, requesting nausea medication    Balance Overall balance assessment: Needs assistance Sitting-balance support: No upper extremity supported Sitting balance-Leahy Scale: Fair Sitting balance - Comments: able to maintain balance without UE assist   Standing balance support: Bilateral upper extremity supported;Single extremity supported;During functional activity Standing balance-Leahy Scale: Poor Standing balance comment: 2 long standing trials with marching in place and L foot flat on floor requiring unlocking R knee to achieve.   Worked on standing technique for for efficient stand.                            Cognition Arousal/Alertness: Awake/alert Behavior During Therapy: WFL for tasks assessed/performed Overall Cognitive Status: Within Functional Limits for tasks assessed                                        Exercises General Exercises - Lower Extremity Ankle  Circles/Pumps: AROM;Both;10 reps;Seated (30) Long Arc Quad: AROM;Both;15 reps;Seated Hip ABduction/ADduction: AROM;10 reps;Seated;Both Hip Flexion/Marching: AROM;Both;10 reps;Seated (pt with limited ability to isolate hip flexors to achieve proper form) Other Exercises Other Exercises: ankle pumps from supine BLEs x10 reps Other Exercises: LAQ BLEs from sitting EOB x10 reps Other Exercises: seated marching BLEs from EOB x10 reps    General Comments General comments (skin integrity,  edema, etc.): VSS      Pertinent Vitals/Pain Pain Assessment: Faces Faces Pain Scale: Hurts a little bit Pain Location: R knee/hip>L knee Pain Descriptors / Indicators: Grimacing;Guarding;Discomfort Pain Intervention(s): Limited activity within patient's tolerance;Monitored during session;Repositioned           PT Goals (current goals can now be found in the care plan section) Acute Rehab PT Goals Patient Stated Goal: to get stronger and maybe rehab before home with [facility] PT Goal Formulation: With patient Time For Goal Achievement: 03/25/20 Potential to Achieve Goals: Good Progress towards PT goals: Progressing toward goals    Frequency    Min 3X/week      PT Plan Current plan remains appropriate    Co-evaluation              AM-PAC PT "6 Clicks" Mobility   Outcome Measure  Help needed turning from your back to your side while in a flat bed without using bedrails?: A Little Help needed moving from lying on your back to sitting on the side of a flat bed without using bedrails?: A Little Help needed moving to and from a bed to a chair (including a wheelchair)?: A Lot Help needed standing up from a chair using your arms (e.g., wheelchair or bedside chair)?: A Little Help needed to walk in hospital room?: Total Help needed climbing 3-5 steps with a railing? : Total 6 Click Score: 13    End of Session Equipment Utilized During Treatment: Gait belt Activity Tolerance: Treatment limited secondary to medical complications (Comment) (nausea requesting medication) Patient left: with call bell/phone within reach;in chair;with chair alarm set Nurse Communication: Mobility status PT Visit Diagnosis: Other abnormalities of gait and mobility (R26.89);Muscle weakness (generalized) (M62.81);Difficulty in walking, not elsewhere classified (R26.2)     Time: [id number] PT Time Calculation (min) (ACUTE ONLY): 21 min  Charges:  $Therapeutic Exercise: 8-22 mins                      Jeymi Hepp B. Migdalia Dk PT, DPT Acute Rehabilitation Services Pager [phone] Office [phone]    [place] 03/25/2020, 4:11 PM

## 2020-03-25 NOTE — Care Management Important Message (Signed)
Important Message  Patient Details  Name: Max Pittman. MRN: [id number] Date of Birth: 18-Mar-1975   Medicare Important Message Given:  Yes     Shelda Altes 03/25/2020, 9:42 AM

## 2020-03-25 NOTE — Progress Notes (Signed)
3 Days Post-Op   Subjective/Chief Complaint: No acute changes. Hgb stable. Patient has occasional nausea but no vomiting. Having bowel movements.   Objective: Vital signs in last 24 hours: Temp:  [97.9 F (36.6 C)-98.7 F (37.1 C)] 97.9 F (36.6 C) (02/28 0804) Pulse Rate:  [89-100] 100 (02/28 0804) Resp:  [14-20] 17 (02/28 0804) BP: (124-177)/(76-92) 151/92 (02/28 0804) SpO2:  [95 %-100 %] 100 % (02/28 0804) Last BM Date: 03/23/20  Intake/Output from previous day: 02/27 0701 - 02/28 0700 In: 360 [P.O.:360] Out: 400 [Urine:400] Intake/Output this shift: No intake/output data recorded.  General appearance: alert and cooperative Resp: normal work of breathing on room air GI: incisions c/d/i, abdomen soft, nondistended, minimally tender  Lab Results:  Recent Labs    03/24/20 0140 03/25/20 0104  WBC 11.2* 10.4  HGB 8.5* 8.7*  HCT 26.5* 29.0*  PLT 431* 414*   BMET Recent Labs    03/23/20 0733  NA 135  K 4.1  CL 99  CO2 23  GLUCOSE 94  BUN 36*  CREATININE 8.41*  CALCIUM 8.7*   PT/INR Recent Labs    03/24/20 0140 03/25/20 0104  LABPROT 17.7* 16.6*  INR 1.5* 1.4*   ABG No results for input(s): PHART, HCO3 in the last 72 hours.  Invalid input(s): PCO2, PO2  Studies/Results: No results found.   Assessment/Plan: s/p Procedure(s) with comments: REMOVAL OF PERITONEAL DIALYSIS CATHETER (N/A) - ROOM 1 STATING AT 11:00AM FOR 60 MIN Continue diet as tolerated Retroperitoneal hematoma - hgb stable Having bowel function Surgery will sign off at this time, please call with any further questions or concerns   LOS: 18 days    Michaelle Birks, MD [facility], Hepatobiliary and Pancreatic Surgery 03/25/20 11:04 AM

## 2020-03-25 NOTE — Progress Notes (Signed)
[facility] PROGRESS NOTE    Progress Note  Max Pittman.  [id number] DOB: 04-20-1975 DOA: 03/07/2020 PCP: Chesley Noon, MD     Brief Narrative:   Max Pittman. is an 45 y.o. male past medical history of end-stage renal disease on peritoneal dialysis, diabetes mellitus type 2, essential hypertension, DVT on Coumadin sleep apnea comes into the hospital for shortness of breath on arrival to the ED had a cardiac arrest with 12 minutes of CPR before ROSC admitted to the ICU and intubated on 03/07/2020 self extubated on 01/08/2021 transferred to the hospitalist service on 01/11/2021, now requiring CRRT in the ICU.  Was transitioned to hemodialysis, nephrology and IR working on removing his temporary catheter and placing tunneled catheter for permanent dialysis and removing her peritoneal dialysis catheter.  Awaiting inpatient rehab evaluation  Procedures: Tunneled catheter placed on 03/20/2020 by IR. Peritoneal catheter removal on 03/22/2020.  Significant studies: CT scan of the abdomen and pelvis done on 03/21/2020 showed large left psoas hematoma ~about 10 x 7 and extending 20 cm in length. Assessment/Plan:   Acute respiratory failure with hypoxia (HCC) leading to cardiac arrest: In the setting of fluid overload, now resolved. Has been weaned to room air denies any shortness of breath. Physical therapy evaluated the patient who recommended inpatient rehab.  End-stage renal disease on peritoneal dialysis/hyperkalemia/hypomagnesemia: Had significantly drop compared to when admission 158kg he weighs 119 kg. Next hemodialysis on 03/26/2020 HD catheter has been working well will need to be set up in clips as an outpatient for dialysis.  Spontaneous retroperitoneal iliopsoas hematoma Leukocytosis is resolved he has remained afebrile. Hemoglobin has stabilized. Repeat a CBC tomorrow morning. He should remain off anticoagulation he is no longer a candidate due to  spontaneous bleeds, beside he had one episode of thromboembolic events and has been on anticoagulation for 6 months which she has completed his treatment. Can probably go back on aspirin in a week.  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 He was hospitalized at that time and completed 6 weeks of IV antibiotics now on suppressive therapy with Keflex. Blood cultures and fluid cultures were drawn on 03/19/2018 have remained negative till date.  Cardiogenic shock: Now resolved.  Obstructive sleep apnea: Noncompliant with CPAP.  Acute metabolic encephalopathy: Probably due to respiratory distress.  Deconditioning: Physical therapy evaluated the patient recommended skilled nursing facility continue to work with physical therapy.  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 We will discontinue all anticoagulation.  History of CVA/history of seizures: EEG on 03/09/2019 showed no seizure disorder continue Lamictal and Keppra.  Essential hypertension: Continue amlodipine and metoprolol. Add low-dose Imdur his blood pressure continues to be significantly elevated.  Normocytic anemia: Status post 1 unit of packed red blood cells his hemoglobin today is 8.6. Check a CBC in the morning.   DVT prophylaxis: lovenox Family Communication:none Status is: Inpatient  Remains inpatient appropriate because:Hemodynamically unstable   Dispo: The patient is from: Home              Anticipated d/c is to: SNF              Anticipated d/c date is: 1days              Patient currently is not medically stable  to d/c.   Difficult to place patient No  Code Status:     Code Status Orders  (From admission, onward)         Start     Ordered   03/07/20 0440  Full code  Continuous        03/07/20  0441        Code Status History    Date Active Date Inactive Code Status Order ID Comments User Context   07/13/2019 2250 07/28/2019 2220 Full Code [id number]  Bethena Roys, MD Inpatient   07/01/2019 0254 07/06/2019 2206 Full Code [id number]  Edmonia Lynch, DO ED   06/02/2019 1610 06/17/2019 1520 Full Code [id number]  Elizabeth Sauer Inpatient   06/02/2019 1610 06/02/2019 1610 Full Code [id number]  Elizabeth Sauer Inpatient   05/21/2019 0025 06/02/2019 1557 Full Code [id number]  Danne Baxter Inpatient   03/22/2019 [id number] 03/24/2019 1612 Full Code [id number]  Bethena Roys, MD Inpatient   Advance Care Planning Activity        IV Access:    Peripheral IV   Procedures and diagnostic studies:   No results found.   Medical Consultants:    None.   Subjective:    Max Pittman. relates his back pain is improving.  Objective:    Vitals:   03/24/20 2104 03/24/20 2333 03/25/20 0324 03/25/20 0804  BP: 129/76 124/76 126/79 (!) 151/92  Pulse: 93 89 93 100  Resp: '19 14 20 17  '$ Temp: 98.7 F (37.1 C) 98.3 F (36.8 C) 98.3 F (36.8 C) 97.9 F (36.6 C)  TempSrc: Oral Oral Oral Oral  SpO2: 97% 96% 95% 100%  Weight:      Height:       SpO2: 100 % O2 Flow Rate (L/min): 2 L/min FiO2 (%): 97 %   Intake/Output Summary (Last 24 hours) at 03/25/2020 1006 Last data filed at 03/24/2020 2104 Gross per 24 hour  Intake 360 ml  Output 400 ml  Net -40 ml   Filed Weights   03/23/20 0706 03/23/20 1035 03/24/20 0630  Weight: 123 kg 120.2 kg 119.1 kg    Exam: General exam: In no acute distress. Respiratory system: Good air movement and clear to auscultation. Cardiovascular system: S1 & S2 heard, RRR. No JVD. Gastrointestinal system: Abdomen is nondistended, soft and nontender.  Extremities: No pedal edema. Skin: No rashes, lesions or ulcers Psychiatry: Judgement and insight appear normal. Mood & affect appropriate.   Data Reviewed:     Labs: Basic Metabolic Panel: Recent Labs  Lab 03/19/20 0633 03/20/20 0044 03/21/20 1203 03/23/20 0733  NA 132* 133* 133* 135  K 4.1 3.7 3.8 4.1  CL 96* 96* 96* 99  CO2 23 22 21* 23  GLUCOSE 154* 156* 97 94  BUN 56* 59* 58* 36*  CREATININE 8.69* 9.24* 9.64* 8.41*  CALCIUM 9.4 9.1 8.7* 8.7*  MG 2.4  --   --   --   PHOS 5.6*  --   --   --    GFR Estimated Creatinine Clearance: 14.4 mL/min (A) (by C-G formula based on SCr of 8.41 mg/dL (H)). Liver Function Tests: Recent Labs  Lab 03/19/20 [id number] 03/20/20 0044 03/23/20 0733  AST  --  40 19  ALT  --  15 5  ALKPHOS  --  76 73  BILITOT  --  0.5 0.6  PROT  --  6.7 6.2*  ALBUMIN 2.5* 2.2* 1.9*   No results for input(s): LIPASE,  AMYLASE in the last 168 hours. No results for input(s): AMMONIA in the last 168 hours. Coagulation profile Recent Labs  Lab 03/21/20 0108 03/22/20 0052 03/23/20 0149 03/24/20 0140 03/25/20 0104  INR 1.4* 1.6* 1.4* 1.5* 1.4*   COVID-19 Labs  No results for input(s): DDIMER, FERRITIN, LDH, CRP in the last 72 hours.  Lab Results  Component Value Date   SARSCOV2NAA NEGATIVE 03/07/2020   [place] NEGATIVE 07/13/2019   [place] NEGATIVE 06/30/2019   [place] NEGATIVE 05/20/2019    CBC: Recent Labs  Lab 03/21/20 0108 03/22/20 0052 03/23/20 0149 03/24/20 0140 03/25/20 0104  WBC 13.0* 10.9* 12.7* 11.2* 10.4  NEUTROABS 8.9*  --   --   --   --   HGB 7.7* 7.3* 8.8* 8.5* 8.7*  HCT 24.7* 22.9* 28.9* 26.5* 29.0*  MCV 89.8 90.2 92.3 91.7 92.9  PLT 457* 468* 499* 431* 414*   Cardiac Enzymes: No results for input(s): CKTOTAL, CKMB, CKMBINDEX, TROPONINI in the last 168 hours. BNP (last 3 results) No results for input(s): PROBNP in the last 8760 hours. CBG: Recent Labs  Lab 03/24/20 1747 03/24/20 2108 03/24/20 2334 03/25/20 0327 03/25/20 0803  GLUCAP 104* 90 75 111* 95   D-Dimer: No results for input(s): DDIMER in the last 72 hours. Hgb A1c: No results for input(s):  HGBA1C in the last 72 hours. Lipid Profile: No results for input(s): CHOL, HDL, LDLCALC, TRIG, CHOLHDL, LDLDIRECT in the last 72 hours. Thyroid function studies: No results for input(s): TSH, T4TOTAL, T3FREE, THYROIDAB in the last 72 hours.  Invalid input(s): FREET3 Anemia work up: No results for input(s): VITAMINB12, FOLATE, FERRITIN, TIBC, IRON, RETICCTPCT in the last 72 hours. Sepsis Labs: Recent Labs  Lab 03/22/20 0052 03/23/20 0149 03/24/20 0140 03/25/20 0104  WBC 10.9* 12.7* 11.2* 10.4   Microbiology Recent Results (from the past 240 hour(s))  Culture, blood (Routine X 2) w Reflex to ID Panel     Status: None   Collection Time: 03/19/20 10:30 AM   Specimen: BLOOD RIGHT HAND  Result Value Ref Range Status   Specimen Description BLOOD RIGHT HAND  Final   Special Requests   Final    BOTTLES DRAWN AEROBIC ONLY Blood Culture results may not be optimal due to an inadequate volume of blood received in culture bottles   Culture   Final    NO GROWTH 5 DAYS Performed at [facility], [place] [address] [place], [place] [address]    Report Status 03/24/2020 FINAL  Final  Culture, blood (Routine X 2) w Reflex to ID Panel     Status: None   Collection Time: 03/19/20 10:41 AM   Specimen: BLOOD RIGHT WRIST  Result Value Ref Range Status   Specimen Description BLOOD RIGHT WRIST  Final   Special Requests   Final    BOTTLES DRAWN AEROBIC AND ANAEROBIC Blood Culture adequate volume   Culture   Final    NO GROWTH 5 DAYS Performed at [facility], [place] [address] [place], [place] [address]    Report Status 03/24/2020 FINAL  Final  Body fluid culture w Gram Stain     Status: None   Collection Time: 03/20/20  8:12 AM   Specimen: Peritoneal Dialysate; Body Fluid  Result Value Ref Range Status   Specimen Description PERITONEAL DIALYSATE  Final   Special Requests Immunocompromised  Final   Gram Stain   Final    RARE WBC PRESENT, PREDOMINANTLY MONONUCLEAR NO ORGANISMS SEEN     Culture  Final    NO GROWTH 3 DAYS Performed at [facility], [place] [address], [place], [place] [address]    Report Status 03/23/2020 FINAL  Final     Medications:   . (feeding supplement) PROSource Plus  30 mL Oral BID WC  . amLODipine  5 mg Oral Daily  . atorvastatin  40 mg Oral q1800  . buPROPion  150 mg Oral BID WC  . cephALEXin  250 mg Oral Q12H  . Chlorhexidine Gluconate Cloth  6 each Topical Daily  . Chlorhexidine Gluconate Cloth  6 each Topical Q0600  . [START ON 03/28/2020] darbepoetin (ARANESP) injection - DIALYSIS  100 mcg Intravenous Q Thu-HD  . docusate sodium  100 mg Oral BID  . feeding supplement  237 mL Oral BID BM  . FLUoxetine  10 mg Oral Daily  . insulin aspart  0-20 Units Subcutaneous Q4H  . isosorbide mononitrate  15 mg Oral Daily  . lamoTRIgine  100 mg Oral BID  . levETIRAcetam  500 mg Oral Daily  . loratadine  10 mg Oral Daily  . mouth rinse  15 mL Mouth Rinse BID  . melatonin  5 mg Oral QHS  . metoprolol tartrate  12.5 mg Oral BID  . multivitamin  1 tablet Oral QHS  . pantoprazole  40 mg Oral Daily  . polyethylene glycol  17 g Oral Daily  . pregabalin  75 mg Oral QHS  . sevelamer carbonate  2,400 mg Oral TID WC  . sodium chloride flush  10-40 mL Intracatheter Q12H   Continuous Infusions:     LOS: 18 days   Charlynne Cousins  [facility]  03/25/2020, 10:06 AM

## 2020-03-25 NOTE — Progress Notes (Signed)
Patient ID: Max Pittman., male   DOB: 07/21/1975, 45 y.o.   MRN: [id number]  [facility] Progress Note   Assessment/ Plan:   1.  Acute hypoxic respiratory failure/ cardiac arrest: due to severe volume overload/pulmonary edema causing asystolic cardiac arrest. Resolved w/ CRRT.  2. ESRD: severe vol overload on admission. Had CRRT w/ UF of 31 L. Suspected membrane failure so cannot do PD anymore. Transitioning to HD. IR placed [place] on 2/23. PD cath removed 2/25 per gen surgery. TTS HD here, next HD due 3/01. Is interested in home hemo-  Usually we want a fistula for that - did talk about that briefly but may be too soon right now given what he has been through 3. Retroperitoneal hematoma: seen by CT 2/24 large R iliopsoas hematoma 10 x 7 x 20 cm. Anticoagulation on hold.  4. Anemia ckd: Hb in 8- 9 range, sp darbe 100 ug on 2/24 + q thurs going ahead.  SP 1u prbc here.  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 Nutrition: On renal diet  7. Hypertension: no gross vol ^ on exam, holding wt's high 110 kg's. Continues on norvasc/ metoprolol.    8. 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 H/o CVA/ hx seizures - taking lamictal and keppra 10. Dispo - Per PT needs SNF placement for rehab -  Unclear when    [name]  03/25/2020, 9:16 AM    Subjective:   Pt seen in room, doing a bit better, abd pain improving. No c/o. No issues with HD   Objective:   BP (!) 151/92 (BP Location: Right Arm)   Pulse 100   Temp 97.9 F (36.6 C) (Oral)   Resp 17   Ht '5\' 9"'$  (1.753 m)   Wt 119.1 kg   SpO2 100%   BMI 38.77 kg/m   Physical Exam: Gen: Resting comfortably in bed CVS: RRR no mrg   Resp: CTA, no rales or wheezing Abd: Soft, obese, nontender, pd cath removed Ext: no LE or UE edema  Thrombosed LUA AVF   R IJ [facility]    Labs: BMET Recent Labs  Lab 03/19/20 0633 03/20/20 0044 03/21/20 1203 03/23/20 0733  NA 132*  133* 133* 135  K 4.1 3.7 3.8 4.1  CL 96* 96* 96* 99  CO2 23 22 21* 23  GLUCOSE 154* 156* 97 94  BUN 56* 59* 58* 36*  CREATININE 8.69* 9.24* 9.64* 8.41*  CALCIUM 9.4 9.1 8.7* 8.7*  PHOS 5.6*  --   --   --    CBC Recent Labs  Lab 03/21/20 0108 03/22/20 0052 03/23/20 0149 03/24/20 0140 03/25/20 0104  WBC 13.0* 10.9* 12.7* 11.2* 10.4  NEUTROABS 8.9*  --   --   --   --   HGB 7.7* 7.3* 8.8* 8.5* 8.7*  HCT 24.7* 22.9* 28.9* 26.5* 29.0*  MCV 89.8 90.2 92.3 91.7 92.9  PLT 457* 468* 499* 431* 414*     Medications:    . (feeding supplement) PROSource Plus  30 mL Oral BID WC  . amLODipine  5 mg Oral Daily  . atorvastatin  40 mg Oral q1800  . buPROPion  150 mg Oral BID WC  . cephALEXin  250 mg Oral Q12H  . Chlorhexidine Gluconate Cloth  6 each Topical Daily  . Chlorhexidine Gluconate Cloth  6 each Topical Q0600  . [START ON 03/28/2020] darbepoetin (ARANESP) injection - DIALYSIS  100 mcg Intravenous Q Thu-HD  . docusate sodium  100 mg Oral BID  . feeding supplement  237 mL Oral BID BM  . FLUoxetine  10 mg Oral Daily  . insulin aspart  0-20 Units Subcutaneous Q4H  . isosorbide mononitrate  15 mg Oral Daily  . lamoTRIgine  100 mg Oral BID  . levETIRAcetam  500 mg Oral Daily  . loratadine  10 mg Oral Daily  . mouth rinse  15 mL Mouth Rinse BID  . melatonin  5 mg Oral QHS  . metoprolol tartrate  12.5 mg Oral BID  . multivitamin  1 tablet Oral QHS  . pantoprazole  40 mg Oral Daily  . polyethylene glycol  17 g Oral Daily  . pregabalin  75 mg Oral QHS  . sevelamer carbonate  2,400 mg Oral TID WC  . sodium chloride flush  10-40 mL Intracatheter Q12H

## 2020-03-26 ENCOUNTER — Other Ambulatory Visit: Payer: Self-pay

## 2020-03-26 ENCOUNTER — Encounter (HOSPITAL_COMMUNITY): Payer: Self-pay | Admitting: Physical Medicine and Rehabilitation

## 2020-03-26 ENCOUNTER — Inpatient Hospital Stay (HOSPITAL_COMMUNITY)
Admission: RE | Admit: 2020-03-26 | Discharge: 2020-04-11 | DRG: 939 | Disposition: A | Discharge status: Home Health | Payer: Medicare Other | Source: Intra-hospital | Attending: Physical Medicine and Rehabilitation | Admitting: Physical Medicine and Rehabilitation

## 2020-03-26 DIAGNOSIS — R42 Dizziness and giddiness: Secondary | ICD-10-CM

## 2020-03-26 DIAGNOSIS — T82868A Thrombosis of vascular prosthetic devices, implants and grafts, initial encounter: Secondary | ICD-10-CM | POA: Diagnosis present

## 2020-03-26 DIAGNOSIS — Z6839 Body mass index (BMI) 39.0-39.9, adult: Secondary | ICD-10-CM

## 2020-03-26 DIAGNOSIS — G8929 Other chronic pain: Secondary | ICD-10-CM | POA: Diagnosis present

## 2020-03-26 DIAGNOSIS — Z8249 Family history of ischemic heart disease and other diseases of the circulatory system: Secondary | ICD-10-CM

## 2020-03-26 DIAGNOSIS — E1142 Type 2 diabetes mellitus with diabetic polyneuropathy: Secondary | ICD-10-CM | POA: Diagnosis present

## 2020-03-26 DIAGNOSIS — E1122 Type 2 diabetes mellitus with diabetic chronic kidney disease: Secondary | ICD-10-CM | POA: Diagnosis present

## 2020-03-26 DIAGNOSIS — M25369 Other instability, unspecified knee: Secondary | ICD-10-CM | POA: Diagnosis present

## 2020-03-26 DIAGNOSIS — D72829 Elevated white blood cell count, unspecified: Secondary | ICD-10-CM | POA: Diagnosis present

## 2020-03-26 DIAGNOSIS — Z7901 Long term (current) use of anticoagulants: Secondary | ICD-10-CM | POA: Diagnosis not present

## 2020-03-26 DIAGNOSIS — Z79899 Other long term (current) drug therapy: Secondary | ICD-10-CM

## 2020-03-26 DIAGNOSIS — I10 Essential (primary) hypertension: Secondary | ICD-10-CM | POA: Diagnosis not present

## 2020-03-26 DIAGNOSIS — Z8674 Personal history of sudden cardiac arrest: Secondary | ICD-10-CM | POA: Diagnosis not present

## 2020-03-26 DIAGNOSIS — G4733 Obstructive sleep apnea (adult) (pediatric): Secondary | ICD-10-CM | POA: Diagnosis present

## 2020-03-26 DIAGNOSIS — G40909 Epilepsy, unspecified, not intractable, without status epilepticus: Secondary | ICD-10-CM | POA: Diagnosis present

## 2020-03-26 DIAGNOSIS — B9561 Methicillin susceptible Staphylococcus aureus infection as the cause of diseases classified elsewhere: Secondary | ICD-10-CM | POA: Diagnosis present

## 2020-03-26 DIAGNOSIS — N186 End stage renal disease: Secondary | ICD-10-CM

## 2020-03-26 DIAGNOSIS — N185 Chronic kidney disease, stage 5: Secondary | ICD-10-CM | POA: Diagnosis not present

## 2020-03-26 DIAGNOSIS — I120 Hypertensive chronic kidney disease with stage 5 chronic kidney disease or end stage renal disease: Secondary | ICD-10-CM | POA: Diagnosis present

## 2020-03-26 DIAGNOSIS — M89552 Osteolysis, left thigh: Secondary | ICD-10-CM | POA: Diagnosis not present

## 2020-03-26 DIAGNOSIS — R569 Unspecified convulsions: Secondary | ICD-10-CM

## 2020-03-26 DIAGNOSIS — Z87891 Personal history of nicotine dependence: Secondary | ICD-10-CM | POA: Diagnosis not present

## 2020-03-26 DIAGNOSIS — G9340 Encephalopathy, unspecified: Secondary | ICD-10-CM | POA: Diagnosis not present

## 2020-03-26 DIAGNOSIS — Z83438 Family history of other disorder of lipoprotein metabolism and other lipidemia: Secondary | ICD-10-CM

## 2020-03-26 DIAGNOSIS — M545 Low back pain, unspecified: Secondary | ICD-10-CM | POA: Diagnosis present

## 2020-03-26 DIAGNOSIS — Z885 Allergy status to narcotic agent status: Secondary | ICD-10-CM

## 2020-03-26 DIAGNOSIS — S32402K Unspecified fracture of left acetabulum, subsequent encounter for fracture with nonunion: Secondary | ICD-10-CM

## 2020-03-26 DIAGNOSIS — T8452XD Infection and inflammatory reaction due to internal left hip prosthesis, subsequent encounter: Secondary | ICD-10-CM

## 2020-03-26 DIAGNOSIS — R000 Tachycardia, unspecified: Secondary | ICD-10-CM | POA: Diagnosis not present

## 2020-03-26 DIAGNOSIS — E11649 Type 2 diabetes mellitus with hypoglycemia without coma: Secondary | ICD-10-CM | POA: Diagnosis present

## 2020-03-26 DIAGNOSIS — D631 Anemia in chronic kidney disease: Secondary | ICD-10-CM | POA: Diagnosis present

## 2020-03-26 DIAGNOSIS — G4700 Insomnia, unspecified: Secondary | ICD-10-CM | POA: Diagnosis present

## 2020-03-26 DIAGNOSIS — G5791 Unspecified mononeuropathy of right lower limb: Secondary | ICD-10-CM

## 2020-03-26 DIAGNOSIS — Z7982 Long term (current) use of aspirin: Secondary | ICD-10-CM

## 2020-03-26 DIAGNOSIS — Z833 Family history of diabetes mellitus: Secondary | ICD-10-CM

## 2020-03-26 DIAGNOSIS — Z8673 Personal history of transient ischemic attack (TIA), and cerebral infarction without residual deficits: Secondary | ICD-10-CM | POA: Diagnosis not present

## 2020-03-26 DIAGNOSIS — S7011XS Contusion of right thigh, sequela: Secondary | ICD-10-CM | POA: Diagnosis not present

## 2020-03-26 DIAGNOSIS — D649 Anemia, unspecified: Secondary | ICD-10-CM | POA: Diagnosis not present

## 2020-03-26 DIAGNOSIS — R5381 Other malaise: Secondary | ICD-10-CM | POA: Diagnosis present

## 2020-03-26 DIAGNOSIS — T847XXA Infection and inflammatory reaction due to other internal orthopedic prosthetic devices, implants and grafts, initial encounter: Secondary | ICD-10-CM | POA: Diagnosis present

## 2020-03-26 DIAGNOSIS — E119 Type 2 diabetes mellitus without complications: Secondary | ICD-10-CM

## 2020-03-26 DIAGNOSIS — J9601 Acute respiratory failure with hypoxia: Secondary | ICD-10-CM | POA: Diagnosis not present

## 2020-03-26 DIAGNOSIS — Z7984 Long term (current) use of oral hypoglycemic drugs: Secondary | ICD-10-CM

## 2020-03-26 DIAGNOSIS — M898X9 Other specified disorders of bone, unspecified site: Secondary | ICD-10-CM | POA: Diagnosis present

## 2020-03-26 DIAGNOSIS — Z992 Dependence on renal dialysis: Secondary | ICD-10-CM | POA: Diagnosis not present

## 2020-03-26 DIAGNOSIS — Z91040 Latex allergy status: Secondary | ICD-10-CM

## 2020-03-26 LAB — CBC
HCT: 27.6 % — ABNORMAL LOW (ref 39.0–52.0)
Hemoglobin: 8.5 g/dL — ABNORMAL LOW (ref 13.0–17.0)
MCH: 28.6 pg (ref 26.0–34.0)
MCHC: 30.8 g/dL (ref 30.0–36.0)
MCV: 92.9 fL (ref 80.0–100.0)
Platelets: 435 10*3/uL — ABNORMAL HIGH (ref 150–400)
RBC: 2.97 MIL/uL — ABNORMAL LOW (ref 4.22–5.81)
RDW: 16.3 % — ABNORMAL HIGH (ref 11.5–15.5)
WBC: 10.2 10*3/uL (ref 4.0–10.5)
nRBC: 0 % (ref 0.0–0.2)

## 2020-03-26 LAB — RENAL FUNCTION PANEL
Albumin: 2 g/dL — ABNORMAL LOW (ref 3.5–5.0)
Anion gap: 13 (ref 5–15)
BUN: 43 mg/dL — ABNORMAL HIGH (ref 6–20)
CO2: 24 mmol/L (ref 22–32)
Calcium: 9.1 mg/dL (ref 8.9–10.3)
Chloride: 100 mmol/L (ref 98–111)
Creatinine, Ser: 9.53 mg/dL — ABNORMAL HIGH (ref 0.61–1.24)
GFR, Estimated: 6 mL/min — ABNORMAL LOW (ref >60)
Glucose, Bld: 126 mg/dL — ABNORMAL HIGH (ref 70–99)
Phosphorus: 5.3 mg/dL — ABNORMAL HIGH (ref 2.5–4.6)
Potassium: 4.2 mmol/L (ref 3.5–5.1)
Sodium: 137 mmol/L (ref 135–145)

## 2020-03-26 LAB — GLUCOSE, CAPILLARY
Glucose-Capillary: 104 mg/dL — ABNORMAL HIGH (ref 70–99)
Glucose-Capillary: 93 mg/dL (ref 70–99)
Glucose-Capillary: 119 mg/dL — ABNORMAL HIGH (ref 70–99)

## 2020-03-26 LAB — PROTIME-INR
INR: 1.3 — ABNORMAL HIGH (ref 0.8–1.2)
Prothrombin Time: 15.4 seconds — ABNORMAL HIGH (ref 11.4–15.2)

## 2020-03-26 MED ORDER — LIDOCAINE-PRILOCAINE 2.5-2.5 % EX CREA
1.0000 "application " | TOPICAL_CREAM | CUTANEOUS | Status: DC | PRN
  Filled 2020-03-26: qty 5

## 2020-03-26 MED ORDER — LAMOTRIGINE 100 MG PO TABS
100.0000 mg | ORAL_TABLET | Freq: Two times a day (BID) | ORAL | Status: DC
  Administered 2020-03-26 – 2020-04-11 (×31): 100 mg via ORAL
  Filled 2020-03-26 (×34): qty 1

## 2020-03-26 MED ORDER — SODIUM CHLORIDE 0.9 % IV SOLN: 100.0000 mL | INTRAVENOUS | Status: DC | PRN

## 2020-03-26 MED ORDER — HEPARIN SODIUM (PORCINE) 1000 UNIT/ML IJ SOLN
INTRAMUSCULAR | Status: AC
  Filled 2020-03-26: qty 1

## 2020-03-26 MED ORDER — OXYCODONE-ACETAMINOPHEN 5-325 MG PO TABS
1.0000 | ORAL_TABLET | Freq: Four times a day (QID) | ORAL | Status: DC | PRN
  Filled 2020-03-26: qty 1

## 2020-03-26 MED ORDER — ATORVASTATIN CALCIUM 40 MG PO TABS
40.0000 mg | ORAL_TABLET | Freq: Every day | ORAL | Status: DC
  Administered 2020-03-26 – 2020-04-10 (×16): 40 mg via ORAL
  Filled 2020-03-26 (×17): qty 1

## 2020-03-26 MED ORDER — ORAL CARE MOUTH RINSE
15.0000 mL | Freq: Two times a day (BID) | OROMUCOSAL | Status: DC
  Administered 2020-03-27 – 2020-04-11 (×21): 15 mL via OROMUCOSAL

## 2020-03-26 MED ORDER — PENTAFLUOROPROP-TETRAFLUOROETH EX AERO: 1.0000 "application " | INHALATION_SPRAY | CUTANEOUS | Status: DC | PRN

## 2020-03-26 MED ORDER — MUSCLE RUB 10-15 % EX CREA
TOPICAL_CREAM | Freq: Three times a day (TID) | CUTANEOUS | Status: DC
  Administered 2020-03-28 – 2020-03-30 (×3): 1 via TOPICAL
  Filled 2020-03-26: qty 85

## 2020-03-26 MED ORDER — ALTEPLASE 2 MG IJ SOLR: 2.0000 mg | Freq: Once | INTRAMUSCULAR | Status: DC | PRN

## 2020-03-26 MED ORDER — MENTHOL 3 MG MT LOZG: 1.0000 | LOZENGE | OROMUCOSAL | Status: DC | PRN

## 2020-03-26 MED ORDER — LIDOCAINE HCL (PF) 1 % IJ SOLN: 5.0000 mL | INTRAMUSCULAR | Status: DC | PRN

## 2020-03-26 MED ORDER — MILK AND MOLASSES ENEMA: 1.0000 | Freq: Every day | RECTAL | Status: DC | PRN

## 2020-03-26 MED ORDER — OXYCODONE-ACETAMINOPHEN 5-325 MG PO TABS
1.0000 | ORAL_TABLET | ORAL | Status: DC | PRN
  Administered 2020-03-26: 2 via ORAL
  Administered 2020-03-26: 1 via ORAL
  Administered 2020-03-27 (×3): 2 via ORAL
  Administered 2020-03-27: 1 via ORAL
  Administered 2020-03-28 – 2020-04-11 (×55): 2 via ORAL
  Filled 2020-03-26 (×42): qty 2
  Filled 2020-03-26: qty 1
  Filled 2020-03-26 (×2): qty 2
  Filled 2020-03-26: qty 1
  Filled 2020-03-26: qty 2
  Filled 2020-03-26: qty 1
  Filled 2020-03-26 (×14): qty 2

## 2020-03-26 MED ORDER — PANTOPRAZOLE SODIUM 40 MG PO TBEC
40.0000 mg | DELAYED_RELEASE_TABLET | Freq: Every day | ORAL | Status: DC
  Administered 2020-03-27 – 2020-04-11 (×16): 40 mg via ORAL
  Filled 2020-03-26 (×17): qty 1

## 2020-03-26 MED ORDER — LORATADINE 10 MG PO TABS
10.0000 mg | ORAL_TABLET | Freq: Every day | ORAL | Status: DC
  Administered 2020-03-27 – 2020-04-11 (×16): 10 mg via ORAL
  Filled 2020-03-26 (×16): qty 1

## 2020-03-26 MED ORDER — PREGABALIN 25 MG PO CAPS
75.0000 mg | ORAL_CAPSULE | Freq: Every day | ORAL | Status: DC
  Administered 2020-03-26 – 2020-04-10 (×16): 75 mg via ORAL
  Filled 2020-03-26 (×16): qty 3

## 2020-03-26 MED ORDER — LEVETIRACETAM 500 MG PO TABS
500.0000 mg | ORAL_TABLET | Freq: Every day | ORAL | Status: DC
  Administered 2020-03-27 – 2020-04-11 (×15): 500 mg via ORAL
  Filled 2020-03-26 (×16): qty 1

## 2020-03-26 MED ORDER — SEVELAMER CARBONATE 800 MG PO TABS
2400.0000 mg | ORAL_TABLET | Freq: Three times a day (TID) | ORAL | Status: DC
  Administered 2020-03-26 – 2020-04-09 (×39): 2400 mg via ORAL
  Filled 2020-03-26 (×40): qty 3

## 2020-03-26 MED ORDER — CAMPHOR-MENTHOL 0.5-0.5 % EX LOTN: TOPICAL_LOTION | CUTANEOUS | Status: DC | PRN

## 2020-03-26 MED ORDER — METOPROLOL TARTRATE 12.5 MG HALF TABLET
12.5000 mg | ORAL_TABLET | Freq: Two times a day (BID) | ORAL | Status: DC
  Administered 2020-03-26 – 2020-04-11 (×29): 12.5 mg via ORAL
  Filled 2020-03-26 (×30): qty 1

## 2020-03-26 MED ORDER — DOCUSATE SODIUM 100 MG PO CAPS
100.0000 mg | ORAL_CAPSULE | Freq: Two times a day (BID) | ORAL | Status: DC
  Administered 2020-03-26 – 2020-04-11 (×14): 100 mg via ORAL
  Filled 2020-03-26 (×28): qty 1

## 2020-03-26 MED ORDER — RENA-VITE PO TABS
1.0000 | ORAL_TABLET | Freq: Every day | ORAL | Status: DC
  Administered 2020-03-26 – 2020-04-10 (×16): 1 via ORAL
  Filled 2020-03-26 (×17): qty 1

## 2020-03-26 MED ORDER — PROCHLORPERAZINE MALEATE 5 MG PO TABS
5.0000 mg | ORAL_TABLET | Freq: Four times a day (QID) | ORAL | Status: DC | PRN
  Administered 2020-03-26 – 2020-03-27 (×2): 5 mg via ORAL
  Administered 2020-03-28: 10 mg via ORAL
  Filled 2020-03-26 (×5): qty 2

## 2020-03-26 MED ORDER — GUAIFENESIN-DM 100-10 MG/5ML PO SYRP
5.0000 mL | ORAL_SOLUTION | Freq: Four times a day (QID) | ORAL | Status: DC | PRN
  Filled 2020-03-26: qty 10

## 2020-03-26 MED ORDER — CEPHALEXIN 250 MG PO CAPS
250.0000 mg | ORAL_CAPSULE | Freq: Two times a day (BID) | ORAL | Status: DC
  Administered 2020-03-26 – 2020-04-11 (×32): 250 mg via ORAL
  Filled 2020-03-26 (×32): qty 1

## 2020-03-26 MED ORDER — DARBEPOETIN ALFA 100 MCG/0.5ML IJ SOSY
100.0000 ug | PREFILLED_SYRINGE | INTRAMUSCULAR | Status: DC
  Administered 2020-03-28: 100 ug via INTRAVENOUS

## 2020-03-26 MED ORDER — POLYETHYLENE GLYCOL 3350 17 G PO PACK: 17.0000 g | PACK | Freq: Every day | ORAL | Status: DC | PRN

## 2020-03-26 MED ORDER — ASPIRIN 81 MG PO TBEC: 81.0000 mg | DELAYED_RELEASE_TABLET | Freq: Every day | ORAL | 12 refills | Status: DC

## 2020-03-26 MED ORDER — FLUOXETINE HCL 10 MG PO CAPS
10.0000 mg | ORAL_CAPSULE | Freq: Every day | ORAL | Status: DC
  Administered 2020-03-27 – 2020-04-11 (×16): 10 mg via ORAL
  Filled 2020-03-26 (×17): qty 1

## 2020-03-26 MED ORDER — ACETAMINOPHEN 325 MG PO TABS
325.0000 mg | ORAL_TABLET | ORAL | Status: DC | PRN
  Administered 2020-03-28: 650 mg via ORAL
  Filled 2020-03-26 (×3): qty 2

## 2020-03-26 MED ORDER — POLYETHYLENE GLYCOL 3350 17 G PO PACK
17.0000 g | PACK | Freq: Every day | ORAL | Status: DC
  Administered 2020-03-30 – 2020-04-11 (×4): 17 g via ORAL
  Filled 2020-03-26 (×10): qty 1

## 2020-03-26 MED ORDER — BUPROPION HCL ER (SR) 150 MG PO TB12
150.0000 mg | ORAL_TABLET | Freq: Two times a day (BID) | ORAL | Status: DC
  Administered 2020-03-26 – 2020-04-11 (×31): 150 mg via ORAL
  Filled 2020-03-26 (×36): qty 1

## 2020-03-26 MED ORDER — HEPARIN SODIUM (PORCINE) 1000 UNIT/ML DIALYSIS
1000.0000 [IU] | INTRAMUSCULAR | Status: DC | PRN
  Filled 2020-03-26: qty 1

## 2020-03-26 MED ORDER — ISOSORBIDE MONONITRATE ER 30 MG PO TB24
15.0000 mg | ORAL_TABLET | Freq: Every day | ORAL | Status: DC
  Administered 2020-03-27 – 2020-04-11 (×14): 15 mg via ORAL
  Filled 2020-03-26 (×15): qty 1

## 2020-03-26 MED ORDER — PROCHLORPERAZINE EDISYLATE 10 MG/2ML IJ SOLN: 5.0000 mg | Freq: Four times a day (QID) | INTRAMUSCULAR | Status: DC | PRN

## 2020-03-26 MED ORDER — ALBUTEROL SULFATE (2.5 MG/3ML) 0.083% IN NEBU: 2.5000 mg | INHALATION_SOLUTION | RESPIRATORY_TRACT | Status: DC | PRN

## 2020-03-26 MED ORDER — PROCHLORPERAZINE 25 MG RE SUPP
12.5000 mg | Freq: Four times a day (QID) | RECTAL | Status: DC | PRN
  Filled 2020-03-26: qty 1

## 2020-03-26 MED ORDER — MELATONIN 5 MG PO TABS
5.0000 mg | ORAL_TABLET | Freq: Every day | ORAL | Status: DC
  Administered 2020-03-26 – 2020-04-10 (×16): 5 mg via ORAL
  Filled 2020-03-26 (×16): qty 1

## 2020-03-26 MED ORDER — CHLORHEXIDINE GLUCONATE CLOTH 2 % EX PADS
6.0000 | MEDICATED_PAD | Freq: Every day | CUTANEOUS | Status: DC
  Administered 2020-03-27 – 2020-03-29 (×3): 6 via TOPICAL

## 2020-03-26 MED ORDER — AMLODIPINE BESYLATE 5 MG PO TABS
5.0000 mg | ORAL_TABLET | Freq: Every day | ORAL | Status: DC
  Administered 2020-03-27 – 2020-03-28 (×2): 5 mg via ORAL
  Filled 2020-03-26 (×2): qty 1

## 2020-03-26 MED ORDER — BISACODYL 10 MG RE SUPP
10.0000 mg | Freq: Every day | RECTAL | Status: DC | PRN
Start: 2020-03-26 — End: 2020-04-11

## 2020-03-26 MED ORDER — NEPRO/CARBSTEADY PO LIQD
237.0000 mL | Freq: Three times a day (TID) | ORAL | Status: DC
  Administered 2020-03-26 – 2020-04-11 (×39): 237 mL via ORAL
  Filled 2020-03-26: qty 948
  Filled 2020-03-26 (×6): qty 237

## 2020-03-26 NOTE — Progress Notes (Signed)
PMR Admission Coordinator Pre-Admission Assessment   Patient: Max Pittman. is an 45 y.o., male MRN: [id number] DOB: [birth year] Height: _0  (175.3 cm) Weight: 117.4 kg   Insurance Information HMO:     PPO:      PCP:      IPA:      80/20:      OTHER:  PRIMARY: Medicare A and B      Policy#: [id number]      Subscriber: pt CM Name:       Phone#:      Fax#:  Pre-Cert#: verified online      Employer:  Benefits:  Phone #:      Name:  Eff. Date: A and B 02/26/17     Deduct: $1556      Out of Pocket Max:       Life Max:  CIR: 100%      SNF: 20 full days Outpatient: 80%     Co-Ins: 20% Home Health: 100%      Co-Pay:  DME: 80%     Co-Ins:20% Providers: pt choice SECONDARY: BCBS Supplement      Policy#: [id number]     Phone#:    Development worker, community:       Phone#:    The Engineer, petroleum" for patients in Inpatient Rehabilitation Facilities with attached "Privacy Act [place] Records" was provided and verbally reviewed with: Patient   Emergency Contact Information         Contact Information     Name Relation Home Work Mobile    [place] Significant other [phone]   [phone]         Current Medical History  Patient Admitting Diagnosis: debility    History of Present Illness: Pt is a 45 y/o male with PMH of ESRD (on PD at baseline), DM 2, HTN, DVT (on coumadin), and OSA who was admitted to [facility] on 03/07/20 for worsening SOB.  On arrival to ED pt had cardiac arrest with CPR for 12 minutes before ROSC.  Cardiac arrest felt to be related to severe volume overload and pulmonary edema causing asystole.  He was intubated and admitted to the ICU and self extubated on 2/14.  Pt required CRRT in the ICU with removal of 31L of fluid, and nephrology suspected membrane failure so pt no longer a candidate for PD.  Transition back to HD with TDC placed on 2/23 by IR.  Pt on TRS schedule while admitted at home.  Hospital course complicated by  retroperitoneal hematoma seen on CT 2/24 in area of R iliopsoas so anticoagulation on hold.  ABLA monitoring Hb, which is stable in 8-9 range.  Had initially felt he would improve enough to return home, but continues to require min assist for limited mobility and max to total assist for LB ADLs.  Therapy recommending CIR and pt agreeable.    Patient's medical record from [facility] has been reviewed by the rehabilitation admission coordinator and physician.   Past Medical History      Past Medical History:  Diagnosis Date  . Arthritis    . Closed dislocation of left hip ([place]) 05/21/2019  . Diabetes ([place])    . Diabetes mellitus without complication ([place])    . DVT (deep venous thrombosis) ([place])    . History of peritoneal dialysis    . Hypertension    . Renal disorder      FFGS  . Renal disorder    . Sleep apnea    .  Stroke [facility])      when ha was a child  . Vasovagal syndrome      with syncope      Family History   family history includes CAD in his mother; Diabetes in his father and mother; Healthy in his brother; Heart disease in his mother; Hypercholesterolemia in his father and mother; Hypertension in his father and mother.   Prior Rehab/Hospitalizations Has the patient had prior rehab or hospitalizations prior to admission? Yes   Has the patient had major surgery during 100 days prior to admission? Yes              Current Medications   Current Facility-Administered Medications:  .  albuterol (PROVENTIL) (2.5 MG/3ML) 0.083% nebulizer solution 2.5 mg, 2.5 mg, Nebulization, Q4H PRN, Kinsinger, Arta Bruce, MD .  amLODipine (NORVASC) tablet 5 mg, 5 mg, Oral, Daily, Charlynne Cousins, MD .  atorvastatin (LIPITOR) tablet 40 mg, 40 mg, Oral, q1800, Kinsinger, Arta Bruce, MD, 40 mg at 03/25/20 1607 .  buPROPion [facility] SR) 12 hr tablet 150 mg, 150 mg, Oral, BID WC, Kinsinger, Arta Bruce, MD, 150 mg at 03/25/20 1606 .  cephALEXin (KEFLEX) capsule 250 mg, 250 mg, Oral,  Q12H, Charlynne Cousins, MD, 250 mg at 03/25/20 2128 .  Chlorhexidine Gluconate Cloth 2 % PADS 6 each, 6 each, Topical, Daily, Kinsinger, Arta Bruce, MD, 6 each at 03/20/20 [phone] .  Chlorhexidine Gluconate Cloth 2 % PADS 6 each, 6 each, Topical, Q0600, Kinsinger, Arta Bruce, MD, 6 each at 03/26/20 0516 .  [START ON 03/28/2020] Darbepoetin Alfa (ARANESP) injection 100 mcg, 100 mcg, Intravenous, Q Thu-HD, Charlynne Cousins, MD .  docusate sodium (COLACE) capsule 100 mg, 100 mg, Oral, BID, Kinsinger, Arta Bruce, MD, 100 mg at 03/24/20 2137 .  feeding supplement (NEPRO CARB STEADY) liquid 237 mL, 237 mL, Oral, TID BM, Charlynne Cousins, MD, 237 mL at 03/25/20 2000 .  FLUoxetine (PROZAC) capsule 10 mg, 10 mg, Oral, Daily, Charlynne Cousins, MD .  heparin sodium (porcine) 1000 UNIT/ML injection, , , ,  .  hydrALAZINE (APRESOLINE) injection 10 mg, 10 mg, Intravenous, Q4H PRN, Kinsinger, Arta Bruce, MD, 10 mg at 03/18/20 0806 .  HYDROmorphone (DILAUDID) injection 0.5 mg, 0.5 mg, Intravenous, Q4H PRN, Kinsinger, Arta Bruce, MD, 0.5 mg at 03/26/20 0516 .  isosorbide mononitrate (IMDUR) 24 hr tablet 15 mg, 15 mg, Oral, Daily, Charlynne Cousins, MD, 15 mg at 03/25/20 0855 .  labetalol (NORMODYNE) injection 10 mg, 10 mg, Intravenous, Q1H PRN, Kinsinger, Arta Bruce, MD, 10 mg at 03/23/20 0406 .  lamoTRIgine (LAMICTAL) tablet 100 mg, 100 mg, Oral, BID, Charlynne Cousins, MD, 100 mg at 03/25/20 2225 .  levETIRAcetam (KEPPRA) tablet 500 mg, 500 mg, Oral, Daily, Charlynne Cousins, MD .  loratadine [facility]) tablet 10 mg, 10 mg, Oral, Daily, Charlynne Cousins, MD, 10 mg at 03/25/20 0856 .  MEDLINE mouth rinse, 15 mL, Mouth Rinse, BID, Kinsinger, Arta Bruce, MD, 15 mL at 03/25/20 2226 .  melatonin tablet 5 mg, 5 mg, Oral, QHS, Kinsinger, Arta Bruce, MD, 5 mg at 03/25/20 2128 .  menthol-cetylpyridinium (CEPACOL) lozenge 3 mg, 1 lozenge, Oral, PRN, Kinsinger, Arta Bruce, MD .  metoprolol tartrate  (LOPRESSOR) tablet 12.5 mg, 12.5 mg, Oral, BID, Charlynne Cousins, MD, 12.5 mg at 03/25/20 2128 .  multivitamin (RENA-VIT) tablet 1 tablet, 1 tablet, Oral, QHS, Kinsinger, Arta Bruce, MD, 1 tablet at 03/25/20 2128 .  ondansetron (ZOFRAN) injection 4 mg, 4  mg, Intravenous, Q6H PRN, Kinsinger, Arta Bruce, MD, 4 mg at 03/25/20 2225 .  oxyCODONE-acetaminophen (PERCOCET/ROXICET) 5-325 MG per tablet 1 tablet, 1 tablet, Oral, Q6H PRN, Kinsinger, Arta Bruce, MD, 1 tablet at 03/25/20 2128 .  pantoprazole (PROTONIX) EC tablet 40 mg, 40 mg, Oral, Daily, Kinsinger, Arta Bruce, MD, 40 mg at 03/25/20 0856 .  polyethylene glycol (MIRALAX / GLYCOLAX) packet 17 g, 17 g, Oral, Daily, Kinsinger, Arta Bruce, MD .  pregabalin (LYRICA) capsule 75 mg, 75 mg, Oral, QHS, Charlynne Cousins, MD, 75 mg at 03/25/20 2128 .  sevelamer carbonate (RENVELA) tablet 2,400 mg, 2,400 mg, Oral, TID WC, Charlynne Cousins, MD, 2,400 mg at 03/25/20 1753 .  sodium chloride flush (NS) 0.9 % injection 10-40 mL, 10-40 mL, Intracatheter, Q12H, Kinsinger, Arta Bruce, MD, 10 mL at 03/25/20 2226   Patients Current Diet:     Diet Order                      Diet Carb Modified Fluid consistency: Thin; Room service appropriate? Yes  Diet effective now                      Precautions / Restrictions Precautions Precautions: Fall Precaution Comments: had fall 2/22 Restrictions Weight Bearing Restrictions: No Other Position/Activity Restrictions:  (R hip/knee pain)    Has the patient had 2 or more falls or a fall with injury in the past year? Yes   Prior Activity Level Limited Community (1-2x/wk): was on PD at home, no DME used, was on CIR 2x in 2021   Prior Functional Level Self Care: Did the patient need help bathing, dressing, using the toilet or eating? Needed some help   Indoor Mobility: Did the patient need assistance with walking from room to room (with or without device)? Independent   Stairs: Did the patient need  assistance with internal or external stairs (with or without device)? Dependent   Functional Cognition: Did the patient need help planning regular tasks such as shopping or remembering to take medications? Independent   Home Assistive Devices / Equipment Home Assistive Devices/Equipment: None Home Equipment: Walker - 2 wheels,Bedside commode,Adaptive equipment   Prior Device Use: Indicate devices/aids used by the patient prior to current illness, exacerbation or injury? Walker in the home and w/c in the community   Current Functional Level Cognition   Overall Cognitive Status: Within Functional Limits for tasks assessed Orientation Level: Oriented X4 Following Commands: Follows one step commands with increased time,Follows multi-step commands inconsistently Safety/Judgement: Decreased awareness of deficits General Comments: pt with decreased insight but slightly more awareness today, thinking about rehab (previously didn't want anything except HHPT)    Extremity Assessment (includes Sensation/Coordination)   Upper Extremity Assessment: Generalized weakness  Lower Extremity Assessment: Defer to PT evaluation     ADLs   Overall ADL's : Needs assistance/impaired Eating/Feeding: Set up,Bed level,Supervision/ safety Grooming: Oral care,Sitting,Supervision/safety,Set up Grooming Details (indicate cue type and reason): from recliner Upper Body Bathing: Sitting,Supervision/ safety Lower Body Bathing: Total assistance,Sitting/lateral leans Lower Body Bathing Details (indicate cue type and reason): simulated via LB Dresssing Upper Body Dressing : Minimal assistance,Sitting Upper Body Dressing Details (indicate cue type and reason): to don posterior gown Lower Body Dressing: Total assistance,Bed level Lower Body Dressing Details (indicate cue type and reason): to don socks from bed level Toilet Transfer: Minimal assistance,RW,Stand-pivot,+2 for physical assistance Toilet Transfer Details  (indicate cue type and reason): simulated via simulated toilet transfer from EOB>recliner  with RW and min A +2. Functional mobility during ADLs: Minimal assistance,+2 for physical assistance,Rolling walker General ADL Comments: pt with improvements in pain and requries less assist for functional mobility this session,pt able to progress OOB to recliner this session for ADL participation     Mobility   Overal bed mobility: Needs Assistance Bed Mobility: Supine to Sit Rolling: Supervision Sidelying to sit: Min guard Supine to sit: HOB elevated,Supervision Sit to supine: Min guard Sit to sidelying: Min assist General bed mobility comments: OOB on entry     Transfers   Overall transfer level: Needs assistance Equipment used: Rolling walker (2 wheeled) Transfer via Lift Equipment: Stedy Transfers: Sit to/from Building services engineer Sit to Stand: Min assist (x2 from recliner) Stand pivot transfers: Min assist,+2 physical assistance  Lateral/Scoot Transfers: Supervision (4 feet down in the bed to position for STEDY) General transfer comment: min A for power up, vc for hand placement, and hip rotation and knee extension to achieve fully upright, on second bout of standing pt with increased nausea, requesting nausea medication     Ambulation / Gait / Stairs / Wheelchair Mobility   Ambulation/Gait Ambulation/Gait assistance: Herbalist (Feet): 4 Feet Assistive device:  (STEDY) Gait Pattern/deviations: Step-to pattern General Gait Details: pt marched in place for 20 steps in place each leg with exaggerated though relatively low steps  (times 2 sets over 2 standing trials) Gait velocity: slowed Gait velocity interpretation: <1.31 ft/sec, indicative of household ambulator     Posture / Balance Dynamic Sitting Balance Sitting balance - Comments: able to maintain balance without UE assist Balance Overall balance assessment: Needs assistance Sitting-balance support: No  upper extremity supported Sitting balance-Leahy Scale: Fair Sitting balance - Comments: able to maintain balance without UE assist Postural control: Posterior lean Standing balance support: Bilateral upper extremity supported,Single extremity supported,During functional activity Standing balance-Leahy Scale: Poor Standing balance comment: 2 long standing trials with marching in place and L foot flat on floor requiring unlocking R knee to achieve.   Worked on standing technique for for efficient stand.     Special needs/care consideration Dialysis: Hemodialysis Tuesday, Thursday and Saturday and Diabetic management yes    Previous Home Environment (from acute therapy documentation) Living Arrangements: Spouse/significant other Available Help at Discharge: Available 24 hours/day (girlfriend, [name]) Type of Home: Mobile home Home Layout: One level Home Access: Stairs to enter Entrance Stairs-Rails: Left Entrance Stairs-Number of Steps: 3 Bathroom Shower/Tub: Chiropodist: Standard Home Care Services: No Additional Comments: Pt inconsistent when providing information about home   Discharge Living Setting Plans for Discharge Living Setting: Lives with (comment) (girlfriend and 1 adult daughter) Type of Home at Discharge: Mobile home Discharge Home Layout: One level Discharge Home Access: [place] entrance Discharge Bathroom Shower/Tub: Tub/shower unit Discharge Bathroom Toilet: Standard Discharge Bathroom Accessibility: Yes How Accessible: Accessible via walker (pt reports home is w/c accessible but unsure about whether BR is or not) Does the patient have any problems obtaining your medications?: No   Social/Family/Support Systems Patient Roles: Partner Anticipated Caregiver: Norton Pastel (significant other) Anticipated Caregiver's Contact Information: [phone] Ability/Limitations of Caregiver: min assist Caregiver Availability: 24/7 Discharge Plan Discussed with  Primary Caregiver: Yes Is Caregiver In Agreement with Plan?: Yes Does Caregiver/Family have Issues with Lodging/Transportation while Pt is in Rehab?: No   Goals Patient/Family Goal for Rehab: PT/OT supervision to mod I Expected length of stay: 14-18 days Additional Information: HD TRS Pt/Family Agrees to Admission and willing to participate: Yes Program Orientation Provided &  Reviewed with Pt/Caregiver Including Roles  & Responsibilities: Yes Additional Information Needs: Terri Piedra working on outpatient HD clip   Decrease burden of Care through IP rehab admission: n/a   Possible need for SNF placement upon discharge: Not anticipated.  Pt with previous admission to CIR and d/c home with significant other providing some assist.    Patient Condition: I have reviewed medical records from [facility], spoken with CM, and patient. I met with patient at the bedside for inpatient rehabilitation assessment.  Patient will benefit from ongoing PT and OT, can actively participate in 3 hours of therapy a day 5 days of the week, and can make measurable gains during the admission.  Patient will also benefit from the coordinated team approach during an Inpatient Acute Rehabilitation admission.  The patient will receive intensive therapy as well as Rehabilitation physician, nursing, social worker, and care management interventions.  Due to safety, skin/wound care, disease management, medication administration, pain management and patient education the patient requires 24 hour a day rehabilitation nursing.  The patient is currently min assist with mobility and up to total assist with basic ADLs.  Discharge setting and therapy post discharge at home with home health is anticipated.  Patient has agreed to participate in the Acute Inpatient Rehabilitation Program and will admit today.   Preadmission Screen Completed By:  Meredith Staggers, PT, DPT 03/26/2020 11:32  AM ______________________________________________________________________   Discussed status with Dr. Naaman Plummer on 03/26/20  at 11:32 AM  and received approval for admission today.   Admission Coordinator:  Meredith Staggers, MD, DPT, time 11:32 AM Sudie Grumbling 03/26/20     Assessment/Plan: Diagnosis: Debility after multiple medical 1. Does the need for close, 24 hr/day Medical supervision in concert with the patient's rehab needs make it unreasonable for this patient to be served in a less intensive setting? Yes 2. 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 Due to bladder management, bowel management, safety, skin/wound care, disease management, medication administration, pain management and patient education, does the patient require 24 hr/day rehab nursing? Yes 4. Does the patient require coordinated care of a physician, rehab nurse, PT, OT, and SLP to address physical and functional deficits in the context of the above medical diagnosis(es)? Yes Addressing deficits in the following areas: balance, endurance, locomotion, strength, transferring, bowel/bladder control, bathing, dressing, feeding, grooming, toileting and psychosocial support 5. Can the patient actively participate in an intensive therapy program of at least 3 hrs of therapy 5 days a week? Yes 6. The potential for patient to make measurable gains while on inpatient rehab is excellent 7. Anticipated functional outcomes upon discharge from inpatient rehab: supervision to mod I PT, supervision to min assist OT, n/a SLP 8. Estimated rehab length of stay to reach the above functional goals is: 18-20 days 9. Anticipated discharge destination: Home 10. Overall Rehab/Functional Prognosis: excellent     MD Signature: Meredith Staggers, MD, [place] [facility] 03/26/2020

## 2020-03-26 NOTE — Plan of Care (Signed)
  Problem: Consults Goal: RH GENERAL PATIENT EDUCATION Description: See Patient Education module for education specifics. Outcome: Progressing   Problem: RH SKIN INTEGRITY Goal: RH STG ABLE TO PERFORM INCISION/WOUND CARE W/ASSISTANCE Description: STG Able To Perform Incision/Wound Care With mod I Assistance. Outcome: Progressing   Problem: RH SAFETY Goal: RH STG ADHERE TO SAFETY PRECAUTIONS W/ASSISTANCE/DEVICE Description: STG Adhere to Safety Precautions With mod I Assistance/Device. Outcome: Progressing   Problem: RH PAIN MANAGEMENT Goal: RH STG PAIN MANAGED AT OR BELOW PT'S PAIN GOAL Description: Less than 5 out of 10 Outcome: Progressing   Problem: RH KNOWLEDGE DEFICIT GENERAL Goal: RH STG INCREASE KNOWLEDGE OF SELF CARE AFTER HOSPITALIZATION Description: Patient will be able to demonstrate knowledge of medication management with educational materials and handouts provided by staff.  Outcome: Progressing

## 2020-03-26 NOTE — Progress Notes (Signed)
Inpatient Rehabilitation Medication Review by a Pharmacist  A complete drug regimen review was completed for this patient to identify any potential clinically significant medication issues.  Clinically significant medication issues were identified:  no  Check AMION for pharmacist assigned to patient if future medication questions/issues arise during this admission.  Pharmacist comments:   Time spent performing this drug regimen review (minutes):  15 min   Sherlon Handing, PharmD, BCPS Please see amion for complete clinical pharmacist phone list 03/26/2020 5:02 PM

## 2020-03-26 NOTE — H&P (Signed)
Physical Medicine and Rehabilitation Admission H&P    Chief Complaint  Patient presents with  . Debility    HPI: Dao Strite is a 45 year old male with history of T2DM, HTN, ESRD-on PD, CVA, seizure disorder, chronic hip infection--on Keflex for suppression, DVT who was admitted on 03/07/20 with with cardiac arrest due to "massive volume overload". He required 12 minutes of CPR with ROSC, was intubated in ED and treated with cooling protocol. Per reports, patient was having difficulty with PD prior to admission and IJ placed by PCCM with CRRT for management of fluid overload. Asystolic arrest felt to be due to fluid overload and EEG done showing evidence of diffuse encephalopathy without seizures. He was treated with IV antibiotics due purulent secretions/ concerns of aspiration PNA as well as pressors for BP support.  He self extubated on 02/14 and did not tolerate BIPAP but respiratory status stable. Mentation improved and he was started on regular diet.   He had recurrent rise in WBC and was started on Ancef 02/22--BCX 2 were negative. Tunneled IJ catheter placed by Dr. Sharen Heck.  Nephrology questioned compliance with PD v/s failure and he has been transitioned to HD.  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 He was transfused with one unit PRBC for acute on chronic anemia with drop in H/H to 7.3/22.9-->ASA and coumadin d/c as he has completed >6 months treatment for DVT. PD catheter removed by Dr. Elias Else.  He is tolerating HD and plans for fistula placement towards end of rehab. Therapy has been ongoing working on pre-gait activity. Patient noted to be debilitated with RLE>LLE pain with weakness and decreased coordination, knee instability and nausea with standing attempts. CIR  recommended due to functional decline.    Review of Systems  Constitutional: Negative for chills and fever.  HENT: Negative for hearing loss and tinnitus.   Eyes: Negative for blurred vision and double vision.  Respiratory: Negative for cough and shortness of breath.   Cardiovascular: Positive for chest pain (mid nipple line since CPR).  Gastrointestinal: Positive for heartburn and nausea (before BM and when getting up). Negative for constipation.  Genitourinary: Negative for dysuria and urgency.  Musculoskeletal: Positive for back pain and joint pain.  Skin: Negative for itching and rash.  Neurological: Positive for sensory change (RLE numb from hip to foot.  chronic numbness bilateral feet. ) and focal weakness (RLE with new weakness).  Psychiatric/Behavioral: The patient is not nervous/anxious.       Past Medical History:  Diagnosis Date  . Arthritis   . Closed dislocation of left hip ([place]) 05/21/2019  . Diabetes ([place])   . Diabetes mellitus without complication ([place])   . DVT (deep venous thrombosis) ([place])   . History of peritoneal dialysis   . Hypertension   . Renal disorder    FFGS  . Renal disorder   . Sleep apnea   . Stroke [facility])    when ha was a child  . Vasovagal syndrome    with syncope    Past Surgical History:  Procedure Laterality Date  . AMPUTATION Right 07/21/2019   Procedure: Right index finger amputation as necessary at distal interphalangeal joint;  Surgeon: Roseanne Kaufman, MD;  Location: [place];  Service: Orthopedics;  Laterality: Right;  60  mins  . APPLICATION OF WOUND VAC Left 05/22/2019   Procedure: APPLICATION OF WOUND VAC  LEFT HIP;  Surgeon: Altamese Sorento, MD;  Location: [place];  Service: Orthopedics;  Laterality: Left;  . APPLICATION OF WOUND VAC Left 07/17/2019   Procedure: WOUND VAC CHANGE;  Surgeon: Altamese Spotsylvania, MD;  Location: [place];  Service: Orthopedics;  Laterality: Left;  . AV FISTULA INSERTION W/ RF MAGNETIC GUIDANCE Left   . AV FISTULA  PLACEMENT    . BUBBLE STUDY  05/25/2019   Procedure: BUBBLE STUDY;  Surgeon: Dorothy Spark, MD;  Location: [place];  Service: Cardiovascular;;  . CHOLECYSTECTOMY    . HERNIA REPAIR    . HIP CLOSED REDUCTION Left 05/20/2019   Procedure: CLOSED REDUCTION HIP WITH TRACTION PIN APPLICATION;  Surgeon: Altamese Defiance, MD;  Location: [place];  Service: Orthopedics;  Laterality: Left;  . INCISION AND DRAINAGE HIP Left 07/14/2019   Procedure: IRRIGATION AND DEBRIDEMENT HIP;  Surgeon: Altamese Ulm, MD;  Location: [place];  Service: Orthopedics;  Laterality: Left;  . INCISION AND DRAINAGE HIP Left 07/17/2019   Procedure: REPEAT IRRIGATION AND DEBRIDEMENT LEFT HIP;  Surgeon: Altamese Burt, MD;  Location: [place];  Service: Orthopedics;  Laterality: Left;  . IR FLUORO GUIDE CV LINE RIGHT  05/23/2019  . IR FLUORO GUIDE CV LINE RIGHT  07/21/2019  . IR PERC TUN PERIT CATH WO PORT S&I Dartha Lodge  03/20/2020  . IR REMOVAL TUN CV CATH W/O FL  08/29/2019  . IR US GUIDE VASC ACCESS RIGHT  05/23/2019  . IR US GUIDE VASC ACCESS RIGHT  07/21/2019  . IR US GUIDE VASC ACCESS RIGHT  03/20/2020  . LEFT HEART CATH AND CORONARY ANGIOGRAPHY N/A 03/22/2019   Procedure: LEFT HEART CATH AND CORONARY ANGIOGRAPHY;  Surgeon: Wellington Hampshire, MD;  Location: [place] CV [facility];  Service: Cardiovascular;  Laterality: N/A;  . LOOP RECORDER INSERTION N/A 05/25/2019   Procedure: LOOP RECORDER INSERTION;  Surgeon: [name], MD;  Location: [place] [facility];  Service: Cardiovascular;  Laterality: N/A;  . MINOR REMOVAL OF PERITONEAL DIALYSIS CATHETER N/A 03/22/2020   Procedure: REMOVAL OF PERITONEAL DIALYSIS CATHETER;  Surgeon: Kieth Brightly, Arta Bruce, MD;  Location: [place];  Service: General;  Laterality: N/A;  ROOM 1 STATING AT 11:00AM FOR 60 MIN  . ORIF ACETABULAR FRACTURE Left 05/22/2019   Procedure: OPEN REDUCTION INTERNAL FIXATION (ORIF) T TYPE  WITH ASSOCIATED POSTERIOR WALL ACETABULAR FRACTURE, LEFT; REMOVAL OF TRACTION PIN LEFT TIBIA;   Surgeon: Altamese Mes[place] Vista, MD;  Location: [place];  Service: Orthopedics;  Laterality: Left;  . REMOVAL OF A DIALYSIS CATHETER Right 07/17/2019   Procedure: REMOVAL OF HEMODIALYSIS DIALYSIS CATHETER;  Surgeon: Altamese Gordon, MD;  Location: [place];  Service: Orthopedics;  Laterality: Right;  . TEE WITHOUT CARDIOVERSION N/A 05/25/2019   Procedure: TRANSESOPHAGEAL ECHOCARDIOGRAM (TEE);  Surgeon: Dorothy Spark, MD;  Location: [facility] ENDOSCOPY;  Service: Cardiovascular;  Laterality: N/A;  . TEE WITHOUT CARDIOVERSION N/A 07/19/2019   Procedure: TRANSESOPHAGEAL ECHOCARDIOGRAM (TEE);  Surgeon: Sueanne Margarita, MD;  Location: [facility] ENDOSCOPY;  Service: Cardiovascular;  Laterality: N/A;  . TONSILLECTOMY      Family History  Problem Relation Age of Onset  . Heart disease Mother   . Diabetes Mother   . Hypertension Mother   . Diabetes Father   . Hypertension Father   . CAD Mother        "angina"  . Hypercholesterolemia Mother   . Hypercholesterolemia Father   . Healthy Brother  Social History: Lives with girlfriend and granddaughter. He reports that he quit smoking about 16 months ago. He has never used smokeless tobacco. He reports current alcohol use--couple of ounces of beer occasionally. He reports that he does not use drugs.     Allergies  Allergen Reactions  . Doxycycline Hives  . Latex Swelling    Pt reports swelling at site.   . Morphine And Related Anxiety    Medications Prior to Admission  Medication Sig Dispense Refill  . acetaminophen (TYLENOL) 325 MG tablet Take 2 tablets (650 mg total) by mouth every 8 (eight) hours. (Patient taking differently: Take 650 mg by mouth every 6 (six) hours as needed for mild pain.)    . amLODipine (NORVASC) 5 MG tablet Take 1 tablet (5 mg total) by mouth daily. 30 tablet 0  . aspirin EC 81 MG EC tablet Take 1 tablet (81 mg total) by mouth daily.    Marland Kitchen atorvastatin (LIPITOR) 40 MG tablet Take 1 tablet (40 mg total) by mouth daily at 6 PM. 31 tablet 6   . baclofen (LIORESAL) 10 MG tablet Take 10 mg by mouth 3 (three) times daily.    Marland Kitchen buPROPion (WELLBUTRIN SR) 150 MG 12 hr tablet Take 1 tablet (150 mg total) by mouth 2 (two) times daily with a meal. 60 tablet 0  . docusate sodium (COLACE) 100 MG capsule Take 300 mg by mouth daily as needed for mild constipation or moderate constipation. As directed     . FLUoxetine (PROZAC) 10 MG tablet Take 10 mg by mouth daily.    . furosemide (LASIX) 40 MG tablet Take 120 mg by mouth 2 (two) times daily.    . hydrOXYzine (ATARAX/VISTARIL) 25 MG tablet Take 25 mg by mouth every 8 (eight) hours as needed for itching.    Marland Kitchen KLOR-CON M20 20 MEQ tablet Take 20 mEq by mouth daily.    Marland Kitchen lamoTRIgine (LAMICTAL) 100 MG tablet Take 1 tablet (100 mg total) by mouth 2 (two) times daily. 60 tablet 3  . levETIRAcetam (KEPPRA) 500 MG tablet Take 1 tablet (500 mg total) by mouth daily. (Patient taking differently: Take 500 mg by mouth at bedtime.) 90 tablet 3  . loratadine (CLARITIN) 10 MG tablet Take 1 tablet (10 mg total) by mouth daily. 30 tablet 0  . metoprolol tartrate (LOPRESSOR) 25 MG tablet Take 0.5 tablets (12.5 mg total) by mouth 2 (two) times daily. 30 tablet 0  . omeprazole (PRILOSEC) 40 MG capsule Take 40 mg by mouth daily.    Marland Kitchen oxyCODONE-acetaminophen (PERCOCET/ROXICET) 5-325 MG tablet Take 1 tablet by mouth every 6 (six) hours as needed for pain.    . pregabalin (LYRICA) 50 MG capsule Take 1 capsule (50 mg total) by mouth at bedtime. (Patient taking differently: Take 50 mg by mouth 3 (three) times daily.) 30 capsule 0  . sevelamer carbonate (RENVELA) 800 MG tablet Take 3 tablets (2,400 mg total) by mouth 3 (three) times daily with meals. 240 tablet 1  . warfarin (COUMADIN) 2 MG tablet Take 2 tablets daily except 1 1/2 tablets on Sundays and Wednesdays or as directed (Patient taking differently: Take 3-4 mg by mouth See admin instructions. Take 2 tablets ( 4 mg) daily except 1 1/2 tablets  (3 mg) on Sundays and  Wednesdays or as directed) 60 tablet 4  . gentamicin cream (GARAMYCIN) 0.1 % Apply 1 application topically daily. (Patient not taking: Reported on 03/07/2020) 15 g 0  . pioglitazone (ACTOS) 15 MG tablet Take  1 tablet (15 mg total) by mouth daily. (Patient not taking: No sig reported) 30 tablet 0  . potassium chloride (KLOR-CON) 10 MEQ tablet Take 2 tablets (20 mEq total) by mouth daily. (Patient not taking: Reported on 03/07/2020) 30 tablet 0    Drug Regimen Review  Drug regimen was reviewed and remains appropriate with no significant issues identified  Home: Home Living Family/patient expects to be discharged to:: Private residence Living Arrangements: Spouse/significant other Available Help at Discharge: Available 24 hours/day (girlfriend, [name]) Type of Home: Mobile home Home Access: Stairs to enter [employer] of Steps: 3 Entrance Stairs-Rails: Left Home Layout: One level Bathroom Shower/Tub: Chiropodist: Standard Home Equipment: Environmental consultant - 2 wheels,Bedside Neurosurgeon: Reacher,Sock aid Additional Comments: Pt inconsistent when providing information about home   Functional History: Prior Function Level of Independence: Needs assistance Gait / Transfers Assistance Needed: pt reports ambulating with RW at home ADL's / Homemaking Assistance Needed: Able to perform ADLs with some difficulty using AE. Girlfriend performs IADLs. Was a truck driver Comments: Inconsistent when providing information. Poor historian and requiring increased time.  Functional Status:  Mobility: Bed Mobility Overal bed mobility: Needs Assistance Bed Mobility: Supine to Sit Rolling: Supervision Sidelying to sit: Min guard Supine to sit: HOB elevated,Supervision Sit to supine: Min guard Sit to sidelying: Min assist General bed mobility comments: OOB on entry Transfers Overall transfer level: Needs assistance Equipment used: Rolling walker  (2 wheeled) Transfer via Lift Equipment: Stedy Transfers: Sit to/from Building services engineer Sit to Stand: Min assist (x2 from recliner) Stand pivot transfers: Min assist,+2 physical assistance  Lateral/Scoot Transfers: Supervision (4 feet down in the bed to position for STEDY) General transfer comment: min A for power up, vc for hand placement, and hip rotation and knee extension to achieve fully upright, on second bout of standing pt with increased nausea, requesting nausea medication Ambulation/Gait Ambulation/Gait assistance: Min assist Gait Distance (Feet): 4 Feet Assistive device:  (STEDY) Gait Pattern/deviations: Step-to pattern General Gait Details: pt marched in place for 20 steps in place each leg with exaggerated though relatively low steps  (times 2 sets over 2 standing trials) Gait velocity: slowed Gait velocity interpretation: <1.31 ft/sec, indicative of household ambulator    ADL: ADL Overall ADL's : Needs assistance/impaired Eating/Feeding: Set up,Bed level,Supervision/ safety Grooming: Oral care,Sitting,Supervision/safety,Set up Grooming Details (indicate cue type and reason): from recliner Upper Body Bathing: Sitting,Supervision/ safety Lower Body Bathing: Total assistance,Sitting/lateral leans Lower Body Bathing Details (indicate cue type and reason): simulated via LB Dresssing Upper Body Dressing : Minimal assistance,Sitting Upper Body Dressing Details (indicate cue type and reason): to don posterior gown Lower Body Dressing: Total assistance,Bed level Lower Body Dressing Details (indicate cue type and reason): to don socks from bed level Toilet Transfer: Minimal assistance,RW,Stand-pivot,+2 for physical assistance Toilet Transfer Details (indicate cue type and reason): simulated via simulated toilet transfer from EOB>recliner with RW and min A +2. Functional mobility during ADLs: Minimal assistance,+2 for physical assistance,Rolling walker General ADL  Comments: pt with improvements in pain and requries less assist for functional mobility this session,pt able to progress OOB to recliner this session for ADL participation  Cognition: Cognition Overall Cognitive Status: Within Functional Limits for tasks assessed Orientation Level: Oriented X4 Cognition Arousal/Alertness: Awake/alert Behavior During Therapy: WFL for tasks assessed/performed Overall Cognitive Status: Within Functional Limits for tasks assessed Area of Impairment: Safety/judgement,Memory,Following commands,Awareness,Problem solving Orientation Level: Disoriented to,Situation Memory: Decreased short-term memory Following Commands: Follows one step commands with increased time,Follows multi-step  commands inconsistently Safety/Judgement: Decreased awareness of deficits Awareness: Intellectual Problem Solving: Requires verbal cues,Slow processing General Comments: pt with decreased insight but slightly more awareness today, thinking about rehab (previously didn't want anything except HHPT)    Blood pressure (!) 133/93, pulse 90, temperature 98.4 F (36.9 C), temperature source Oral, resp. rate 16, height '5\' 9"'$  (1.753 m), weight 119.4 kg, SpO2 100 %. Physical Exam Vitals and nursing note reviewed.  Constitutional:      General: He is not in acute distress.    Appearance: He is obese.  HENT:     Head: Normocephalic and atraumatic.     Right Ear: External ear normal.     Left Ear: External ear normal.     Nose: Nose normal. No congestion.     Mouth/Throat:     Mouth: Mucous membranes are moist.     Pharynx: Oropharynx is clear.  Eyes:     Conjunctiva/sclera: Conjunctivae normal.     Pupils: Pupils are equal, round, and reactive to light.  Cardiovascular:     Rate and Rhythm: Normal rate and regular rhythm.     Heart sounds: No murmur heard. No gallop.   Pulmonary:     Effort: Pulmonary effort is normal. No respiratory distress.     Breath sounds: No wheezing,  rhonchi or rales.  Abdominal:     General: Bowel sounds are normal. There is no distension.     Palpations: Abdomen is soft. There is no mass.     Tenderness: There is no abdominal tenderness.     Comments: Wounds from PD cath removal present, clean, intact  Musculoskeletal:        General: Swelling (right thigh) and tenderness (right thigh, hip as well as left hip) present.     Cervical back: Normal range of motion.  Skin:    General: Skin is warm and dry.     Comments: Left > right shin with pock marked healed lesions.   Neurological:     Mental Status: He is alert and oriented to person, place, and time. Mental status is at baseline.     Cranial Nerves: No cranial nerve deficit.     Comments: Motor 4+/5 UE. RLE limited by pain 2/5 prox to 4/5 distally. LLE 3/5 prox to 4/5 distally. Distal LE sensory loss to LT. DTR's 1+  Psychiatric:        Mood and Affect: Mood normal.        Behavior: Behavior normal.        Thought Content: Thought content normal.     Results for orders placed or performed during the hospital encounter of 03/07/20 (from the past 48 hour(s))  Glucose, capillary     Status: None   Collection Time: 03/24/20 12:34 PM  Result Value Ref Range   Glucose-Capillary 92 70 - 99 mg/dL    Comment: Glucose reference range applies only to samples taken after fasting for at least 8 hours.  Glucose, capillary     Status: Abnormal   Collection Time: 03/24/20  5:47 PM  Result Value Ref Range   Glucose-Capillary 104 (H) 70 - 99 mg/dL    Comment: Glucose reference range applies only to samples taken after fasting for at least 8 hours.  Glucose, capillary     Status: None   Collection Time: 03/24/20  9:08 PM  Result Value Ref Range   Glucose-Capillary 90 70 - 99 mg/dL    Comment: Glucose reference range applies only to samples taken after fasting  for at least 8 hours.  Glucose, capillary     Status: None   Collection Time: 03/24/20 11:34 PM  Result Value Ref Range    Glucose-Capillary 75 70 - 99 mg/dL    Comment: Glucose reference range applies only to samples taken after fasting for at least 8 hours.  Protime-INR     Status: Abnormal   Collection Time: 03/25/20  1:04 AM  Result Value Ref Range   Prothrombin Time 16.6 (H) 11.4 - 15.2 seconds   INR 1.4 (H) 0.8 - 1.2    Comment: (NOTE) INR goal varies based on device and disease states. Performed at [facility], [place] [address], [place], [place] [address]   CBC     Status: Abnormal   Collection Time: 03/25/20  1:04 AM  Result Value Ref Range   WBC 10.4 4.0 - 10.5 K/uL   RBC 3.12 (L) 4.22 - 5.81 MIL/uL   Hemoglobin 8.7 (L) 13.0 - 17.0 g/dL   HCT 29.0 (L) 39.0 - 52.0 %   MCV 92.9 80.0 - 100.0 fL   MCH 27.9 26.0 - 34.0 pg   MCHC 30.0 30.0 - 36.0 g/dL   RDW 16.5 (H) 11.5 - 15.5 %   Platelets 414 (H) 150 - 400 K/uL   nRBC 0.2 0.0 - 0.2 %    Comment: Performed at [place] [address], [place], [place] [address]  Glucose, capillary     Status: Abnormal   Collection Time: 03/25/20  3:27 AM  Result Value Ref Range   Glucose-Capillary 111 (H) 70 - 99 mg/dL    Comment: Glucose reference range applies only to samples taken after fasting for at least 8 hours.  Glucose, capillary     Status: None   Collection Time: 03/25/20  8:03 AM  Result Value Ref Range   Glucose-Capillary 95 70 - 99 mg/dL    Comment: Glucose reference range applies only to samples taken after fasting for at least 8 hours.  Glucose, capillary     Status: Abnormal   Collection Time: 03/25/20 11:27 AM  Result Value Ref Range   Glucose-Capillary 126 (H) 70 - 99 mg/dL    Comment: Glucose reference range applies only to samples taken after fasting for at least 8 hours.  Glucose, capillary     Status: Abnormal   Collection Time: 03/25/20  4:45 PM  Result Value Ref Range   Glucose-Capillary 105 (H) 70 - 99 mg/dL    Comment: Glucose reference range applies only to samples taken after fasting for at least 8 hours.   Glucose, capillary     Status: Abnormal   Collection Time: 03/25/20  8:49 PM  Result Value Ref Range   Glucose-Capillary 108 (H) 70 - 99 mg/dL    Comment: Glucose reference range applies only to samples taken after fasting for at least 8 hours.  Glucose, capillary     Status: Abnormal   Collection Time: 03/25/20 11:14 PM  Result Value Ref Range   Glucose-Capillary 135 (H) 70 - 99 mg/dL    Comment: Glucose reference range applies only to samples taken after fasting for at least 8 hours.  Protime-INR     Status: Abnormal   Collection Time: 03/26/20  1:39 AM  Result Value Ref Range   Prothrombin Time 15.4 (H) 11.4 - 15.2 seconds   INR 1.3 (H) 0.8 - 1.2    Comment: (NOTE) INR goal varies based on device and disease states. Performed at [facility], [address], [place], [place] [address]   Glucose, capillary     Status: Abnormal   Collection Time: 03/26/20  3:53 AM  Result Value Ref Range   Glucose-Capillary 104 (H) 70 - 99 mg/dL    Comment: Glucose reference range applies only to samples taken after fasting for at least 8 hours.  Renal function panel     Status: Abnormal   Collection Time: 03/26/20  6:48 AM  Result Value Ref Range   Sodium 137 135 - 145 mmol/L   Potassium 4.2 3.5 - 5.1 mmol/L   Chloride 100 98 - 111 mmol/L   CO2 24 22 - 32 mmol/L   Glucose, Bld 126 (H) 70 - 99 mg/dL    Comment: Glucose reference range applies only to samples taken after fasting for at least 8 hours.   BUN 43 (H) 6 - 20 mg/dL   Creatinine, Ser 9.53 (H) 0.61 - 1.24 mg/dL   Calcium 9.1 8.9 - 10.3 mg/dL   Phosphorus 5.3 (H) 2.5 - 4.6 mg/dL   Albumin 2.0 (L) 3.5 - 5.0 g/dL   GFR, Estimated 6 (L) >60 mL/min    Comment: (NOTE) Calculated using the CKD-EPI Creatinine Equation (2021)    Anion gap 13 5 - 15    Comment: Performed at [place] [address] [place], [place] [address]  CBC     Status: Abnormal   Collection Time: 03/26/20  6:48 AM  Result Value Ref Range   WBC  10.2 4.0 - 10.5 K/uL   RBC 2.97 (L) 4.22 - 5.81 MIL/uL   Hemoglobin 8.5 (L) 13.0 - 17.0 g/dL   HCT 27.6 (L) 39.0 - 52.0 %   MCV 92.9 80.0 - 100.0 fL   MCH 28.6 26.0 - 34.0 pg   MCHC 30.8 30.0 - 36.0 g/dL   RDW 16.3 (H) 11.5 - 15.5 %   Platelets 435 (H) 150 - 400 K/uL   nRBC 0.0 0.0 - 0.2 %    Comment: Performed at [facility] [address], [place], [place] [address]   No results found.     Medical Problem List and Plan: 1.  Functional and mobility deficits secondary to debility after cardiac arrest, volume overload multiple medical.   -patient may shower  -ELOS/Goals: 18-20 days, supervision to min assist with self-care and w/c level mobility  2.  Antithrombotics: -DVT/anticoagulation:  Mechanical: Sequential compression devices, below knee Bilateral lower extremities  -antiplatelet therapy: To resume ASA   March 7th per Dr. Olevia Bowens for CVA prophylaxis   3. Pain Management: Will d/c IV dilaudid--> continue oxycodone prn  -discussed Kpad to right thigh hip as well.  4. Mood: LCSW to follow for evaluation and support.   -antipsychotic agents: N/A 5. Neuropsych: This patient is capable of making decisions on his own behalf. 6. Skin/Wound Care: Routine pressure relief measures.  7. Fluids/Electrolytes/Nutrition: Strict I/O. Monitor weights daily. Renal diet with 1200 cc/FR  --consult dietician to educate patient on renal diet.  8. ESRD: Has been transitioned to HD--clipped for MWF at [facility].   --schedule HD at the end of the day to help with tolerance of therapy 9. H/o Seizure d/o: Continue Lamictal and Keppra--monitor for breakthrough seizures.  10. T2DM: Monitor BS ac/hs--has been diet controlled.   --Continue SSI for now 11. HTN: Hgb A1c-5.6 and diet controlled for the past 9 months.    --will monitor BP tid--has been trending upwards.   --Continue Amlodipine, metoprolol and Imdur  and titrate medications as indicated.   --Will order orthostatic vitals as  continues to have increased nausea with activity.  12. Spontaneous, large right iliopsoas hematoma:   Leucocytosis resolving. Hgb stable at 8.5  -pain control as above 13. 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   -consult with ortho while here regarding considerations. Not a surgical candidate at this point, and he might never be unfortunately.   -pain control and activity modification.  14. OSA: Non-compliant with CPAP. 15. Acute on chronic anemia: Continue to monitor H/H. ON aranesp weekly.   --INR slowing normalizing 16. Neuropathy: acute on chronic now with worsening of symptoms RLE due to compression.       Bary Leriche, PA-C 03/26/2020

## 2020-03-26 NOTE — TOC Transition Note (Signed)
Transition of Care (TOC) - CM/SW Discharge Note Marvetta Gibbons RN, BSN Transitions of Care Unit 4E- RN Case Manager See Treatment Team for direct phone #    Patient Details  Name: Max Pittman. MRN: [id number] Date of Birth: November 06, 1975  Transition of Care [facility]) CM/SW Contact:  Dawayne Patricia, RN Phone Number: 03/26/2020, 12:48 PM   Clinical Narrative:    Pt admitted from home w/ wife, s/p cardiac arrest. Pt was doing PD at home now changed over to HD-pt was initially not agreeable to SNF or INPT rehab and wanted to return home- however pt now agreeable to [facility] re[facility] and CM has been notified this AM that CIR has bed available for pt for admission today. Pt has been clipped for outpt HD needs for MWF 6:15am seat time at [facility] when discharged from [place]. (This seat is available to him starting 04/08/20 or later. If patient will be discharged from CIR sooner than 04/08/20, Navigator will need to arrange another seat in the meantime. This has been explained to patient and CIR AC.) per renal navigator note.   Per MD pt is stable for transition to [place] rehab today. Will plan for pt to admit to [facility] INPT rehab later today.    Final next level of care: IP Rehab Facility Barriers to Discharge: Barriers Resolved   Patient Goals and CMS Choice Patient states their goals for this hospitalization and ongoing recovery are:: rehab CMS Medicare.gov Compare Post Acute Care list provided to:: Patient Choice offered to / list presented to : Patient  Discharge Placement                 [facility].       Discharge Plan and Services   Discharge Planning Services: CM Consult Post Acute Care Choice: IP Rehab          DME Arranged: N/A DME Agency: NA       HH Arranged: NA HH Agency: NA        Social Determinants of Health (SDOH) Interventions     Readmission Risk Interventions Readmission Risk Prevention Plan 03/26/2020  Transportation Screening Complete   Medication Review Press photographer) Complete  PCP or Specialist appointment within 3-5 days of discharge Not Complete  PCP/Specialist Appt Not Complete comments going to [place] rehab  [place] or [place] Complete  SW Recovery Care/Counseling Consult Complete  Palliative Care Screening Not [place] Patient Refused

## 2020-03-26 NOTE — PMR Pre-admission (Addendum)
PMR Admission Coordinator Pre-Admission Assessment   Patient: Max John Peffley Jr. is an 44 y.o., male MRN: [id number] DOB: 11/28/1975 Height: 5' 9" (175.3 cm) Weight: 117.4 kg   Insurance Information HMO:     PPO:      PCP:      IPA:      80/20:      OTHER:  PRIMARY: Medicare A and B      Policy#: [id number]      Subscriber: Max Pittman CM Name:       Phone#:      Fax#:  Pre-Cert#: verified online      Employer:  Benefits:  Phone #:      Name:  Eff. Date: A and B 02/26/17     Deduct: $1556      Out of Pocket Max:       Life Max:  CIR: 100%      SNF: 20 full days Outpatient: 80%     Co-Ins: 20% Home Health: 100%      Co-Pay:  DME: 80%     Co-Ins:20% Providers: Max Pittman choice SECONDARY: BCBS Supplement      Policy#: [id number]     Phone#:    Financial Counselor:       Phone#:    The "Data Collection Information Summary" for patients in Inpatient Rehabilitation Facilities with attached "Privacy Act Statement-Health Care Records" was provided and verbally reviewed with: Patient   Emergency Contact Information         Contact Information     Name Relation Home Work Mobile    Hawk,[name] Significant other [phone]   [phone]         Current Medical History  Patient Admitting Diagnosis: debility    History of Present Illness: Max Pittman is a 44 y/o male with PMH of ESRD (on PD at baseline), DM 2, HTN, DVT (on coumadin), and OSA who was admitted to [facility] on 03/07/20 for worsening SOB.  On arrival to ED Max Pittman had cardiac arrest with CPR for 12 minutes before ROSC.  Cardiac arrest felt to be related to severe volume overload and pulmonary edema causing asystole.  He was intubated and admitted to the ICU and self extubated on 2/14.  Max Pittman required CRRT in the ICU with removal of 31L of fluid, and nephrology suspected membrane failure so Max Pittman no longer a candidate for PD.  Transition back to HD with TDC placed on 2/23 by IR.  Max Pittman on TRS schedule while admitted at home.  Hospital course complicated by  retroperitoneal hematoma seen on CT 2/24 in area of R iliopsoas so anticoagulation on hold.  ABLA monitoring Hb, which is stable in 8-9 range.  Had initially felt he would improve enough to return home, but continues to require min assist for limited mobility and max to total assist for LB ADLs.  Therapy recommending CIR and Max Pittman agreeable.    Patient's medical record from  Hospital has been reviewed by the rehabilitation admission coordinator and physician.   Past Medical History      Past Medical History:  Diagnosis Date  . Arthritis    . Closed dislocation of left hip (HCC) 05/21/2019  . Diabetes (HCC)    . Diabetes mellitus without complication (HCC)    . DVT (deep venous thrombosis) (HCC)    . History of peritoneal dialysis    . Hypertension    . Renal disorder      FFGS  . Renal disorder    . Sleep apnea    .   Stroke (HCC)      when ha was a child  . Vasovagal syndrome      with syncope      Family History   family history includes CAD in Max Pittman mother; Diabetes in Max Pittman father and mother; Healthy in Max Pittman brother; Heart disease in Max Pittman mother; Hypercholesterolemia in Max Pittman father and mother; Hypertension in Max Pittman father and mother.   Prior Rehab/Hospitalizations Has the patient had prior rehab or hospitalizations prior to admission? Yes   Has the patient had major surgery during 100 days prior to admission? Yes              Current Medications   Current Facility-Administered Medications:  .  albuterol (PROVENTIL) (2.5 MG/3ML) 0.083% nebulizer solution 2.5 mg, 2.5 mg, Nebulization, Q4H PRN, Kinsinger, Luke Aaron, MD .  amLODipine (NORVASC) tablet 5 mg, 5 mg, Oral, Daily, Feliz Ortiz, Abraham, MD .  atorvastatin (LIPITOR) tablet 40 mg, 40 mg, Oral, q1800, Kinsinger, Luke Aaron, MD, 40 mg at 03/25/20 1607 .  buPROPion (WELLBUTRIN SR) 12 hr tablet 150 mg, 150 mg, Oral, BID WC, Kinsinger, Luke Aaron, MD, 150 mg at 03/25/20 1606 .  cephALEXin (KEFLEX) capsule 250 mg, 250 mg, Oral,  Q12H, Feliz Ortiz, Abraham, MD, 250 mg at 03/25/20 2128 .  Chlorhexidine Gluconate Cloth 2 % PADS 6 each, 6 each, Topical, Daily, Kinsinger, Luke Aaron, MD, 6 each at 03/20/20 0838 .  Chlorhexidine Gluconate Cloth 2 % PADS 6 each, 6 each, Topical, Q0600, Kinsinger, Luke Aaron, MD, 6 each at 03/26/20 0516 .  [START ON 03/28/2020] Darbepoetin Alfa (ARANESP) injection 100 mcg, 100 mcg, Intravenous, Q Thu-HD, Feliz Ortiz, Abraham, MD .  docusate sodium (COLACE) capsule 100 mg, 100 mg, Oral, BID, Kinsinger, Luke Aaron, MD, 100 mg at 03/24/20 2137 .  feeding supplement (NEPRO CARB STEADY) liquid 237 mL, 237 mL, Oral, TID BM, Feliz Ortiz, Abraham, MD, 237 mL at 03/25/20 2000 .  FLUoxetine (PROZAC) capsule 10 mg, 10 mg, Oral, Daily, Feliz Ortiz, Abraham, MD .  heparin sodium (porcine) 1000 UNIT/ML injection, , , ,  .  hydrALAZINE (APRESOLINE) injection 10 mg, 10 mg, Intravenous, Q4H PRN, Kinsinger, Luke Aaron, MD, 10 mg at 03/18/20 0806 .  HYDROmorphone (DILAUDID) injection 0.5 mg, 0.5 mg, Intravenous, Q4H PRN, Kinsinger, Luke Aaron, MD, 0.5 mg at 03/26/20 0516 .  isosorbide mononitrate (IMDUR) 24 hr tablet 15 mg, 15 mg, Oral, Daily, Feliz Ortiz, Abraham, MD, 15 mg at 03/25/20 0855 .  labetalol (NORMODYNE) injection 10 mg, 10 mg, Intravenous, Q1H PRN, Kinsinger, Luke Aaron, MD, 10 mg at 03/23/20 0406 .  lamoTRIgine (LAMICTAL) tablet 100 mg, 100 mg, Oral, BID, Feliz Ortiz, Abraham, MD, 100 mg at 03/25/20 2225 .  levETIRAcetam (KEPPRA) tablet 500 mg, 500 mg, Oral, Daily, Feliz Ortiz, Abraham, MD .  loratadine (CLARITIN) tablet 10 mg, 10 mg, Oral, Daily, Feliz Ortiz, Abraham, MD, 10 mg at 03/25/20 0856 .  MEDLINE mouth rinse, 15 mL, Mouth Rinse, BID, Kinsinger, Luke Aaron, MD, 15 mL at 03/25/20 2226 .  melatonin tablet 5 mg, 5 mg, Oral, QHS, Kinsinger, Luke Aaron, MD, 5 mg at 03/25/20 2128 .  menthol-cetylpyridinium (CEPACOL) lozenge 3 mg, 1 lozenge, Oral, PRN, Kinsinger, Luke Aaron, MD .  metoprolol tartrate  (LOPRESSOR) tablet 12.5 mg, 12.5 mg, Oral, BID, Feliz Ortiz, Abraham, MD, 12.5 mg at 03/25/20 2128 .  multivitamin (RENA-VIT) tablet 1 tablet, 1 tablet, Oral, QHS, Kinsinger, Luke Aaron, MD, 1 tablet at 03/25/20 2128 .  ondansetron (ZOFRAN) injection 4 mg, 4   mg, Intravenous, Q6H PRN, Kinsinger, Luke Aaron, MD, 4 mg at 03/25/20 2225 .  oxyCODONE-acetaminophen (PERCOCET/ROXICET) 5-325 MG per tablet 1 tablet, 1 tablet, Oral, Q6H PRN, Kinsinger, Luke Aaron, MD, 1 tablet at 03/25/20 2128 .  pantoprazole (PROTONIX) EC tablet 40 mg, 40 mg, Oral, Daily, Kinsinger, Luke Aaron, MD, 40 mg at 03/25/20 0856 .  polyethylene glycol (MIRALAX / GLYCOLAX) packet 17 g, 17 g, Oral, Daily, Kinsinger, Luke Aaron, MD .  pregabalin (LYRICA) capsule 75 mg, 75 mg, Oral, QHS, Feliz Ortiz, Abraham, MD, 75 mg at 03/25/20 2128 .  sevelamer carbonate (RENVELA) tablet 2,400 mg, 2,400 mg, Oral, TID WC, Feliz Ortiz, Abraham, MD, 2,400 mg at 03/25/20 1753 .  sodium chloride flush (NS) 0.9 % injection 10-40 mL, 10-40 mL, Intracatheter, Q12H, Kinsinger, Luke Aaron, MD, 10 mL at 03/25/20 2226   Patients Current Diet:     Diet Order                      Diet Carb Modified Fluid consistency: Thin; Room service appropriate? Yes  Diet effective now                      Precautions / Restrictions Precautions Precautions: Fall Precaution Comments: had fall 2/22 Restrictions Weight Bearing Restrictions: No Other Position/Activity Restrictions:  (R hip/knee pain)    Has the patient had 2 or more falls or a fall with injury in the past year? Yes   Prior Activity Level Limited Community (1-2x/wk): was on PD at home, no DME used, was on CIR 2x in 2021   Prior Functional Level Self Care: Did the patient need help bathing, dressing, using the toilet or eating? Needed some help   Indoor Mobility: Did the patient need assistance with walking from room to room (with or without device)? Independent   Stairs: Did the patient need  assistance with internal or external stairs (with or without device)? Dependent   Functional Cognition: Did the patient need help planning regular tasks such as shopping or remembering to take medications? Independent   Home Assistive Devices / Equipment Home Assistive Devices/Equipment: None Home Equipment: Walker - 2 wheels,Bedside commode,Adaptive equipment   Prior Device Use: Indicate devices/aids used by the patient prior to current illness, exacerbation or injury? Walker in the home and w/c in the community   Current Functional Level Cognition   Overall Cognitive Status: Within Functional Limits for tasks assessed Orientation Level: Oriented X4 Following Commands: Follows one step commands with increased time,Follows multi-step commands inconsistently Safety/Judgement: Decreased awareness of deficits General Comments: Max Pittman with decreased insight but slightly more awareness today, thinking about rehab (previously didn't want anything except HHPT)    Extremity Assessment (includes Sensation/Coordination)   Upper Extremity Assessment: Generalized weakness  Lower Extremity Assessment: Defer to Max Pittman evaluation     ADLs   Overall ADL's : Needs assistance/impaired Eating/Feeding: Set up,Bed level,Supervision/ safety Grooming: Oral care,Sitting,Supervision/safety,Set up Grooming Details (indicate cue type and reason): from recliner Upper Body Bathing: Sitting,Supervision/ safety Lower Body Bathing: Total assistance,Sitting/lateral leans Lower Body Bathing Details (indicate cue type and reason): simulated via LB Dresssing Upper Body Dressing : Minimal assistance,Sitting Upper Body Dressing Details (indicate cue type and reason): to don posterior gown Lower Body Dressing: Total assistance,Bed level Lower Body Dressing Details (indicate cue type and reason): to don socks from bed level Toilet Transfer: Minimal assistance,RW,Stand-pivot,+2 for physical assistance Toilet Transfer Details  (indicate cue type and reason): simulated via simulated toilet transfer from EOB>recliner   with RW and min A +2. Functional mobility during ADLs: Minimal assistance,+2 for physical assistance,Rolling walker General ADL Comments: Max Pittman with improvements in pain and requries less assist for functional mobility this session,Max Pittman able to progress OOB to recliner this session for ADL participation     Mobility   Overal bed mobility: Needs Assistance Bed Mobility: Supine to Sit Rolling: Supervision Sidelying to sit: Min guard Supine to sit: HOB elevated,Supervision Sit to supine: Min guard Sit to sidelying: Min assist General bed mobility comments: OOB on entry     Transfers   Overall transfer level: Needs assistance Equipment used: Rolling walker (2 wheeled) Transfer via Lift Equipment: Stedy Transfers: Sit to/from Stand,Lateral/Scoot Transfers Sit to Stand: Min assist (x2 from recliner) Stand pivot transfers: Min assist,+2 physical assistance  Lateral/Scoot Transfers: Supervision (4 feet down in the bed to position for STEDY) General transfer comment: min A for power up, vc for hand placement, and hip rotation and knee extension to achieve fully upright, on second bout of standing Max Pittman with increased nausea, requesting nausea medication     Ambulation / Gait / Stairs / Wheelchair Mobility   Ambulation/Gait Ambulation/Gait assistance: Min assist Gait Distance (Feet): 4 Feet Assistive device:  (STEDY) Gait Pattern/deviations: Step-to pattern General Gait Details: Max Pittman marched in place for 20 steps in place each leg with exaggerated though relatively low steps  (times 2 sets over 2 standing trials) Gait velocity: slowed Gait velocity interpretation: <1.31 ft/sec, indicative of household ambulator     Posture / Balance Dynamic Sitting Balance Sitting balance - Comments: able to maintain balance without UE assist Balance Overall balance assessment: Needs assistance Sitting-balance support: No  upper extremity supported Sitting balance-Leahy Scale: Fair Sitting balance - Comments: able to maintain balance without UE assist Postural control: Posterior lean Standing balance support: Bilateral upper extremity supported,Single extremity supported,During functional activity Standing balance-Leahy Scale: Poor Standing balance comment: 2 long standing trials with marching in place and L foot flat on floor requiring unlocking R knee to achieve.   Worked on standing technique for for efficient stand.     Special needs/care consideration Dialysis: Hemodialysis Tuesday, Thursday and Saturday and Diabetic management yes    Previous Home Environment (from acute therapy documentation) Living Arrangements: Spouse/significant other Available Help at Discharge: Available 24 hours/day (girlfriend, [name]) Type of Home: Mobile home Home Layout: One level Home Access: Stairs to enter Entrance Stairs-Rails: Left Entrance Stairs-Number of Steps: 3 Bathroom Shower/Tub: Tub/shower unit Bathroom Toilet: Standard Home Care Services: No Additional Comments: Max Pittman inconsistent when providing information about home   Discharge Living Setting Plans for Discharge Living Setting: Lives with (comment) (girlfriend and 1 adult daughter) Type of Home at Discharge: Mobile home Discharge Home Layout: One level Discharge Home Access: Ramped entrance Discharge Bathroom Shower/Tub: Tub/shower unit Discharge Bathroom Toilet: Standard Discharge Bathroom Accessibility: Yes How Accessible: Accessible via walker (Max Pittman reports home is w/c accessible but unsure about whether BR is or not) Does the patient have any problems obtaining your medications?: No   Social/Family/Support Systems Patient Roles: Partner Anticipated Caregiver: [name] Hawk (significant other) Anticipated Caregiver's Contact Information: [phone] Ability/Limitations of Caregiver: min assist Caregiver Availability: 24/7 Discharge Plan Discussed with  Primary Caregiver: Yes Is Caregiver In Agreement with Plan?: Yes Does Caregiver/Family have Issues with Lodging/Transportation while Max Pittman is in Rehab?: No   Goals Patient/Family Goal for Rehab: Max Pittman/OT supervision to mod I Expected length of stay: 14-18 days Additional Information: HD TRS Max Pittman/Family Agrees to Admission and willing to participate: Yes Program Orientation Provided &   Reviewed with Max Pittman/Caregiver Including Roles  & Responsibilities: Yes Additional Information Needs: Colleen Shaw working on outpatient HD clip   Decrease burden of Care through IP rehab admission: n/a   Possible need for SNF placement upon discharge: Not anticipated.  Max Pittman with previous admission to CIR and d/c home with significant other providing some assist.    Patient Condition: I have reviewed medical records from [facility], spoken with CM, and patient. I met with patient at the bedside for inpatient rehabilitation assessment.  Patient will benefit from ongoing Max Pittman and OT, can actively participate in 3 hours of therapy a day 5 days of the week, and can make measurable gains during the admission.  Patient will also benefit from the coordinated team approach during an Inpatient Acute Rehabilitation admission.  The patient will receive intensive therapy as well as Rehabilitation physician, nursing, social worker, and care management interventions.  Due to safety, skin/wound care, disease management, medication administration, pain management and patient education the patient requires 24 hour a day rehabilitation nursing.  The patient is currently min assist with mobility and up to total assist with basic ADLs.  Discharge setting and therapy post discharge at home with home health is anticipated.  Patient has agreed to participate in the Acute Inpatient Rehabilitation Program and will admit today.   Preadmission Screen Completed By:  Lener Ventresca T Tramel Westbrook, Max Pittman, DPT 03/26/2020 11:32  AM ______________________________________________________________________   Discussed status with Dr. Aleena Kirkeby on 03/26/20  at 11:32 AM  and received approval for admission today.   Admission Coordinator:  Abou Sterkel T Lesslie Mckeehan, MD, DPT, time 11:32 AM /Date 03/26/20     Assessment/Plan: Diagnosis: Debility after multiple medical 1. Does the need for close, 24 hr/day Medical supervision in concert with the patient's rehab needs make it unreasonable for this patient to be served in a less intensive setting? Yes 2. 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 Due to bladder management, bowel management, safety, skin/wound care, disease management, medication administration, pain management and patient education, does the patient require 24 hr/day rehab nursing? Yes 4. Does the patient require coordinated care of a physician, rehab nurse, Max Pittman, OT, and SLP to address physical and functional deficits in the context of the above medical diagnosis(es)? Yes Addressing deficits in the following areas: balance, endurance, locomotion, strength, transferring, bowel/bladder control, bathing, dressing, feeding, grooming, toileting and psychosocial support 5. Can the patient actively participate in an intensive therapy program of at least 3 hrs of therapy 5 days a week? Yes 6. The potential for patient to make measurable gains while on inpatient rehab is excellent 7. Anticipated functional outcomes upon discharge from inpatient rehab: supervision to mod I Max Pittman, supervision to min assist OT, n/a SLP 8. Estimated rehab length of stay to reach the above functional goals is: 18-20 days 9. Anticipated discharge destination: Home 10. Overall Rehab/Functional Prognosis: excellent     MD Signature: Jamarius Saha T. Italia Wolfert, MD, FAAPMR [facility] 03/26/2020   

## 2020-03-26 NOTE — Discharge Summary (Signed)
Physician Discharge Summary  Max Pittman. [id number] DOB: 07/01/1975 DOA: 03/07/2020  PCP: Chesley Noon, MD  Admit date: 03/07/2020 Discharge date: 03/26/2020  Admitted From: Home Disposition:  CIR  Recommendations for Outpatient Follow-up:  1. Follow up with PCP in 1-2 weeks 2. Please obtain BMP/CBC in one week   Home Health:No Equipment/Devices:None  Discharge Condition:Stable CODE STATUS:Full Diet recommendation: Heart Healthy  Brief/Interim Summary: 45 y.o. male past medical history of end-stage renal disease on peritoneal dialysis, diabetes mellitus type 2, essential hypertension, DVT on Coumadin sleep apnea comes into the hospital for shortness of breath on arrival to the ED had a cardiac arrest with 12 minutes of CPR before ROSC admitted to the ICU and intubated on 03/07/2020 self extubated on 01/08/2021 transferred to the hospitalist service on 01/11/2021, now requiring CRRT in the ICU.  Was transitioned to hemodialysis, nephrology and IR working on removing his temporary catheter and placing tunneled catheter for permanent dialysis and removing her peritoneal dialysis catheter  Discharge Diagnoses:  Principal Problem:   Acute respiratory failure with hypoxia ([place]) Active Problems:   End-stage renal disease on peritoneal dialysis ([place])   ESRD (end stage renal disease) ([place])   Normocytic anemia   Abnormal CXR   Hypokalemia   Cardiac arrest ([place])   Pyuria   Acute encephalopathy   Hypomagnesemia Acute respiratory failure with hypoxia leading to cardiac arrest: In the setting of fluid overload intubated admitted by PCCM. Nephrology was consulted who recommended HD and he was started on hemodialysis after temporary catheter was placed. He was extubated after after 2 days and transition to room air which she tolerated well.  End-stage renal disease on peritoneal dialysis with membrane failure leading to hyperkalemia/hypomagnesemia: Nephrology was consulted on  admission he weighed 158 kg on discharge she notes weight 119 kg. Peritoneal dialysis catheter was removed. IR was consulted and a tunneled catheter was placed and he was started on dialysis which he tolerated well.  Spontaneous retroperitoneal bleed/iliopsoas hematoma: With no procedures in the inguinal area, he started having leukocytosis and back pain CT scan of the abdomen pelvis was done that showed a large iliopsoas hematoma measuring around 10 x 7 and extending 20 cm in length. His Coumadin and aspirin were held. His hemoglobin dropped from 8.8-7.0 he was transfused 1 unit of packed red blood cells. He was monitored for several days and his hemoglobin started to trend up. He is not a candidate for anticoagulation at this point due to his continued retroperitoneal bleed, he had a thromboembolic event last year and this is only when he has completed 6 months treatment of anticoagulation.  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  He now remain on suppressive therapy with Keflex.  Next line culture data done on 03/17/2020 has remained negative till date.  Cardiogenic shock Resolved.  Obstructive sleep apnea: Noncompliant with CPAP.  Acute metabolic encephalopathy: Likely due to respiratory distress and uremia.  Deconditioning: Physical therapy evaluated the patient recommended inpatient rehab.  History of VTE: His thromboembolic event was on [id number] he has completed his treatment and all anticoagulation was discontinued. See above for further details and spontaneous retroperitoneal bleed.  History of CVA/history of seizures: EEG done on 03/09/2019 showed no seizures he was continue Lamictal and Keppra no changes made to his medication.  Pretension: He was continued on amlodipine and  metoprolol.  Acute blood loss anemia/normocytic anemia: He status post 1 unit of packed red blood cells his hemoglobin has remained stable.   Discharge Instructions  Discharge Instructions    Diet - low sodium heart healthy   Complete by: As directed    Increase activity slowly   Complete by: As directed    No wound care   Complete by: As directed      Allergies as of 03/26/2020      Reactions   Doxycycline Hives   Latex Swelling   Pt reports swelling at site.   Morphine And Related Anxiety      Medication List    STOP taking these medications   furosemide 40 MG tablet Commonly known as: LASIX   gentamicin cream 0.1 % Commonly known as: GARAMYCIN   Klor-Con M20 20 MEQ tablet Generic drug: potassium chloride SA   potassium chloride 10 MEQ tablet Commonly known as: KLOR-CON   warfarin 2 MG tablet Commonly known as: Coumadin     TAKE these medications   acetaminophen 325 MG tablet Commonly known as: TYLENOL Take 2 tablets (650 mg total) by mouth every 8 (eight) hours. What changed:   when to take this  reasons to take this   amLODipine 5 MG tablet Commonly known as: NORVASC Take 1 tablet (5 mg total) by mouth daily.   aspirin 81 MG EC tablet Take 1 tablet (81 mg total) by mouth daily. Start taking on: April 01, 2020 What changed: These instructions start on April 01, 2020. If you are unsure what to do until then, ask your doctor or other care provider.   atorvastatin 40 MG tablet Commonly known as: LIPITOR Take 1 tablet (40 mg total) by mouth daily at 6 PM.   baclofen 10 MG tablet Commonly known as: LIORESAL Take 10 mg by mouth 3 (three) times daily.   buPROPion 150 MG 12 hr tablet Commonly known as: WELLBUTRIN SR Take 1 tablet (150 mg total) by mouth 2 (two) times daily with a meal.   cephALEXin 250 MG capsule Commonly known as: Keflex Take 1 capsule (250 mg total) by mouth every 12 (twelve) hours.   docusate sodium 100 MG capsule Commonly  known as: COLACE Take 300 mg by mouth daily as needed for mild constipation or moderate constipation. As directed   FLUoxetine 10 MG tablet Commonly known as: PROZAC Take 10 mg by mouth daily.   hydrOXYzine 25 MG tablet Commonly known as: ATARAX/VISTARIL Take 25 mg by mouth every 8 (eight) hours as needed for itching.   lamoTRIgine 100 MG tablet Commonly known as: LaMICtal Take 1 tablet (100 mg total) by mouth 2 (two) times daily.   levETIRAcetam 500 MG tablet Commonly known as: KEPPRA Take 1 tablet (500 mg total) by mouth daily. What changed: when to take this   loratadine 10 MG tablet Commonly known as: CLARITIN Take 1 tablet (10 mg total) by mouth daily.   metoprolol tartrate 25 MG tablet Commonly known as: LOPRESSOR Take 0.5 tablets (12.5 mg total) by mouth 2 (two) times daily.   omeprazole 40 MG capsule Commonly known as: PRILOSEC Take 40 mg by mouth daily.   oxyCODONE-acetaminophen 5-325 MG tablet Commonly known as: PERCOCET/ROXICET Take 1 tablet by mouth every 6 (six) hours as needed for pain.   pioglitazone 15 MG tablet Commonly known as: ACTOS Take 1 tablet (15 mg total) by mouth daily.   pregabalin 50 MG capsule Commonly known  as: LYRICA Take 1 capsule (50 mg total) by mouth at bedtime. What changed: when to take this   sevelamer carbonate 800 MG tablet Commonly known as: RENVELA Take 3 tablets (2,400 mg total) by mouth 3 (three) times daily with meals.       Allergies  Allergen Reactions  . Doxycycline Hives  . Latex Swelling    Pt reports swelling at site.   . Morphine And Related Anxiety    Consultations:  Nephrology  Pulmonary and critical care    Procedures/Studies: MR LUMBAR SPINE WO CONTRAST  Result Date: 03/19/2020 CLINICAL DATA:  Low back pain with possible infection. EXAM: MRI LUMBAR SPINE WITHOUT CONTRAST TECHNIQUE: Multiplanar, multisequence MR imaging of the lumbar spine was performed. No intravenous contrast was  administered. COMPARISON:  None. FINDINGS: Segmentation:  Standard. Alignment:  Physiologic. Vertebrae:  No fracture, evidence of discitis, or bone lesion. Conus medullaris and cauda equina: Conus extends to the L1 level. Conus and cauda equina appear normal. Paraspinal and other soft tissues: There is an incompletely visualized mixed signal intensity collection anterior to the right iliac bone that measures at least 5.4 cm. Disc levels: The disc spaces above L3 are normal. L3-4: Intermediate disc bulge with mild spinal canal stenosis. No foraminal stenosis. L4-5: Small disc bulge without stenosis. L5-S1: No disc herniation or stenosis.  Moderate facet hypertrophy. IMPRESSION: 1. Incompletely visualized retroperitoneal collection anterior to the right iliac. This may be an abscess or hematoma. CT of the abdomen and pelvis with contrast is recommended. 2. Mild lumbar degenerative disc disease without stenosis. Electronically Signed   By: Ulyses Jarred M.D.   On: 03/19/2020 19:44   CT ABDOMEN PELVIS W CONTRAST  Result Date: 03/21/2020 CLINICAL DATA:  Diffuse abdominal pain EXAM: CT ABDOMEN AND PELVIS WITH CONTRAST TECHNIQUE: Multidetector CT imaging of the abdomen and pelvis was performed using the standard protocol following bolus administration of intravenous contrast. Sagittal and coronal MPR images reconstructed from axial data set. CONTRAST:  131m OMNIPAQUE IOHEXOL 300 MG/ML SOLN IV. Dilute oral contrast. COMPARISON:  05/20/2019 FINDINGS: Lower chest: Bibasilar atelectasis and small pleural effusions greater on LEFT. Hepatobiliary: Gallbladder surgically absent.  Liver unremarkable. Pancreas: Normal appearance Spleen: Normal appearance Adrenals/Urinary Tract: Adrenal glands normal appearance. Marked BILATERAL renal cortical atrophy. Small BILATERAL renal cysts. No hydronephrosis, ureteral dilatation or definite urinary tract calcification. Peritoneal dialysis catheter present. Stomach/Bowel: Normal appendix.  Stomach and bowel loops normal appearance. Vascular/Lymphatic: Scattered atherosclerotic calcifications aorta and iliac arteries. Aorta normal caliber. No adenopathy. Reproductive: Unremarkable prostate gland and seminal vesicles Other: Scattered free fluid consistent with peritoneal dialysis. No definite free air. Small umbilical hernia containing fat as well as a small focus of air, which could be related to the peritoneal dialysis catheter. LEFT flank hernia containing ascites. Musculoskeletal: Bones demineralized. 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 Osteolysis of LEFT femoral head with fluid in the LEFT hip joint and superior subluxation of the proximal LEFT femur. Large RIGHT iliopsoas hematoma largest component measuring 10.0 x 7.6 cm in greatest axial dimensions and is 67 and extending for approximately 20 cm. Scattered subcutaneous edema at the flanks. IMPRESSION: Large RIGHT iliopsoas hematoma 10.0 x 7.6 in axial dimensions and extending 20 cm length. 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 Small umbilical hernia containing fat as well as a small focus of air, which could  be related to the peritoneal dialysis catheter. Bibasilar atelectasis and small pleural effusions greater on LEFT. Marked BILATERAL renal cortical atrophy with small BILATERAL renal cysts consistent with end-stage renal disease. LEFT flank hernia containing ascites. Aortic Atherosclerosis (ICD10-I70.0). Findings called to Dr. Aileen Fass on 03/21/2020 at 1606 hours. Electronically Signed   By: Lavonia Dana M.D.   On: 03/21/2020 16:06   IR US Guide Vasc Access Right  Result Date: 03/20/2020 INDICATION: End-stage renal disease EXAM: TUNNELED CENTRAL VENOUS HEMODIALYSIS CATHETER PLACEMENT WITH ULTRASOUND AND FLUOROSCOPIC GUIDANCE  MEDICATIONS: Ancef 2 g IV. The antibiotic was given in an appropriate time interval prior to skin puncture. ANESTHESIA/SEDATION: Moderate (conscious) sedation was employed during this procedure. A total of Versed 2 mg and Fentanyl 100 mcg was administered intravenously. Moderate Sedation Time: 10 minutes. The patient's level of consciousness and vital signs were monitored continuously by radiology nursing throughout the procedure under my direct supervision. FLUOROSCOPY TIME:  Fluoroscopy Time: 0 minutes 48 seconds (8 mGy). COMPLICATIONS: None immediate. PROCEDURE: Informed written consent was obtained from the patient after a discussion of the risks, benefits, and alternatives to treatment. Questions regarding the procedure were encouraged and answered. The right neck and chest were prepped with chlorhexidine in a sterile fashion, and a sterile drape was applied covering the operative field. Maximum barrier sterile technique with sterile gowns and gloves were used for the procedure. A timeout was performed prior to the initiation of the procedure. The right internal jugular vein was evaluated with ultrasound and shown to be patent. A permanent ultrasound image was obtained and placed in the patient's medical record. Using sterile gel and a sterile probe cover, the right internal jugular vein was entered with a 21 ga needle during real time ultrasound guidance. 0.018 inch guidewire placed and 21 ga needle exchanged for transitional dilator set. Utilizing fluoroscopy, 0.035 inch guidewire advanced through the needle without difficulty. Seriel dilation was performed and peel-away sheath was placed. Attention then turned to the right anterior upper chest. Following local lidocaine administration, the hemodialysis catheter was tunneled from the chest wall to the venotomy site. The catheter was inserted through the peel-away sheath. The tip of the catheter was positioned within the right atrium using fluoroscopic  guidance. All lumens of the catheter aspirated and flushed well. The dialysis lumens were locked with Heparin. The catheter was secured to the skin with suture. The insertion site was covered with a Biopatch and sterile dressing. Temporary right non tunneled hemodialysis catheter was removed without difficulty and hemostasis achieved with manual compression. IMPRESSION: Successful placement of 23 cm tip to cuff tunneled hemodialysis catheter via the right internal jugular vein with tips terminating within the superior aspect of the right atrium. The catheter is ready for immediate use. Electronically Signed   By: Miachel Roux M.D.   On: 03/20/2020 15:10   DG Chest Port 1 View  Result Date: 03/14/2020 CLINICAL DATA:  Cardiac arrest, abnormal respiration. EXAM: PORTABLE CHEST 1 VIEW COMPARISON:  March 13, 2020. FINDINGS: Stable cardiomediastinal silhouette. No pneumothorax is noted. No pleural effusion is noted. Stable bilateral perihilar and basilar opacities are noted concerning for pneumonia or edema. Bony thorax is unremarkable. Right internal jugular catheter is unchanged in position. IMPRESSION: Stable bilateral perihilar and basilar opacities are noted concerning for pneumonia or edema. Electronically Signed   By: Marijo Conception M.D.   On: 03/14/2020 08:21   DG Chest Port 1 View  Result Date: 03/13/2020 CLINICAL DATA:  Respiratory failure EXAM: PORTABLE CHEST 1  VIEW COMPARISON:  03/11/2020 FINDINGS: Cardiomegaly. Vascular congestion and bilateral airspace disease. Airspace disease is improved since prior study. No visible effusions or pneumothorax. Right dialysis catheter remains in place, unchanged. IMPRESSION: Improving bilateral edema or infection. Cardiomegaly, vascular congestion Electronically Signed   By: Rolm Baptise M.D.   On: 03/13/2020 06:47   DG Chest Port 1 View  Result Date: 03/11/2020 CLINICAL DATA:  Shortness of breath EXAM: PORTABLE CHEST 1 VIEW COMPARISON:  March 09, 2020  FINDINGS: Endotracheal tube and nasogastric tube no longer present. Central catheter tip is in the superior vena cava. No evident pneumothorax. There is a small left pleural effusion. There is ill-defined airspace opacity throughout the lungs bilaterally, similar to recent prior study. Heart is mildly enlarged with pulmonary vascularity normal. No adenopathy. Loop recorder on the left. IMPRESSION: Central catheter position unchanged. Endotracheal tube and nasogastric tube no longer present. No pneumothorax. Multifocal airspace opacity persists with small left pleural effusion and cardiomegaly. No new opacity appreciable. Electronically Signed   By: Lowella Grip III M.D.   On: 03/11/2020 08:38   DG Chest Port 1 View  Result Date: 03/09/2020 CLINICAL DATA:  Hypoxia EXAM: PORTABLE CHEST 1 VIEW COMPARISON:  March 08, 2020. FINDINGS: Endotracheal tube tip is 5.7 cm above the carina. Nasogastric tube tip and side port are below the diaphragm. Central catheter tip is in the superior vena cava. No pneumothorax. There is a left pleural effusion. There is ill-defined airspace opacity in each mid and lower lung region. There is cardiomegaly with a degree of pulmonary venous hypertension. The abrupt termination of the left main bronchus seen 1 day prior is not appreciable on this examination. There is an apparent loop recorder on the left. No evident adenopathy. No bone lesions. IMPRESSION: Tube and catheter positions as described without pneumothorax. Cardiomegaly with pulmonary vascular congestion. Left pleural effusion. Areas of airspace opacity bilaterally may represent pulmonary edema or multifocal pneumonia. Both entities may be present concurrently. Electronically Signed   By: Lowella Grip III M.D.   On: 03/09/2020 08:42   DG Chest Port 1 View  Result Date: 03/08/2020 CLINICAL DATA:  45 year old male cardiac arrest.  Intubated. EXAM: PORTABLE CHEST 1 VIEW COMPARISON:  Portable chest 03/07/2020 and  earlier. FINDINGS: Portable AP semi upright view at 0504 hours. Endotracheal tube tip in good position at the level the clavicles. Enteric tube courses to the abdomen, tip not included. Stable right IJ dual-lumen catheter. There is a superficial left chest wall defibrillator or loop recorder. Mildly lower lung volumes since yesterday and increased now widespread left lung opacity. Patchy multifocal perihilar right lung opacity is not significantly changed. Left hemidiaphragm and left heart border now secured. No pneumothorax identified. There is abrupt termination of gas in the left mainstem bronchus. Chronic left posterior rib fractures. Paucity of bowel gas in the upper abdomen. IMPRESSION: 1.  Stable lines and tubes. 2. Increased and now severe opacity throughout much of the left lung. This could reflect worsening pneumonia or Mucous Plugging with atelectasis - and favor the latter as there is fairly abrupt termination of the left mainstem bronchus. 3. Right perihilar opacity which could be edema or infection is stable. Electronically Signed   By: Genevie Ann M.D.   On: 03/08/2020 08:11   DG CHEST PORT 1 VIEW  Result Date: 03/07/2020 CLINICAL DATA:  Check central line placement EXAM: PORTABLE CHEST 1 VIEW COMPARISON:  Film from earlier in the same day. FINDINGS: Cardiac shadow is stable. Loop recorder is again noted.  Endotracheal tube and gastric catheter are seen. New right jugular central line is noted for dialysis therapy. No pneumothorax is noted. Patchy airspace opacities are noted slightly improved when compared with the prior exam. IMPRESSION: No pneumothorax following central line placement. Slight improved aeration in the lungs bilaterally. Electronically Signed   By: Inez Catalina M.D.   On: 03/07/2020 12:52   DG Chest Portable 1 View  Result Date: 03/07/2020 CLINICAL DATA:  Intubation EXAM: PORTABLE CHEST 1 VIEW COMPARISON:  07/13/2019 FINDINGS: Endotracheal tube is just above the carina by 7 mm.  This could be retracted slightly for optimal positioning. NG tube is in the stomach. Severe diffuse bilateral airspace disease. Heart is borderline in size. Suspect small effusions. IMPRESSION: Endotracheal tube is just above the level of the carina. This could be retracted 2-3 cm for optimal positioning. NG tube in the stomach. Severe diffuse bilateral airspace disease could reflect edema or infection. Electronically Signed   By: Rolm Baptise M.D.   On: 03/07/2020 03:11   EEG adult  Result Date: 03/07/2020 Lora Havens, MD     03/07/2020 10:42 AM Patient Name: Max Pittman. MRN: [id number] Epilepsy Attending: Lora Havens Referring Physician/Provider: Montey Hora, PA Date: 03/07/2020 Duration: 23.36 mins Patient history: 45yo M s/p cardiac arrest. EEG to evaluate for seizure Level of alertness:  comatose AEDs during EEG study: LEV, LTG, Propofol Technical aspects: This EEG study was done with scalp electrodes positioned according to the 10-20 International system of electrode placement. Electrical activity was acquired at a sampling rate of '500Hz'$  and reviewed with a high frequency filter of '70Hz'$  and a low frequency filter of '1Hz'$ . EEG data were recorded continuously and digitally stored. Description: EEG showed continuous generalized 5 to 6 Hz theta slowing as well as intermittent 2-'3Hz'$  delta slowing. Hyperventilation and photic stimulation were not performed.   ABNORMALITY -Continuous slow, generalized IMPRESSION: This study is suggestive of moderate diffuse encephalopathy, nonspecific etiology. No seizures or epileptiform discharges were seen throughout the recording. Priyanka Barbra Sarks   Overnight EEG with video  Result Date: 03/08/2020 Lora Havens, MD     03/08/2020  3:43 PM Patient Name: Max Pittman. MRN: [id number] Epilepsy Attending: Lora Havens Referring Physician/Provider: Montey Hora, PA Duration: 03/07/2020 1009 to 03/08/2020 1145  Patient history: 45yo M s/p cardiac  arrest. EEG to evaluate for seizure  Level of alertness:  comatose  AEDs during EEG study: LEV, LTG, Propofol  Technical aspects: This EEG study was done with scalp electrodes positioned according to the 10-20 International system of electrode placement. Electrical activity was acquired at a sampling rate of '500Hz'$  and reviewed with a high frequency filter of '70Hz'$  and a low frequency filter of '1Hz'$ . EEG data were recorded continuously and digitally stored.  Description: EEG showed continuous generalized 5 to 6 Hz theta slowing as well as intermittent 2-'3Hz'$  delta slowing. Hyperventilation and photic stimulation were not performed.    ABNORMALITY -Continuous slow, generalized  IMPRESSION: This study is suggestive of moderate diffuse encephalopathy, nonspecific etiology. No seizures or epileptiform discharges were seen throughout the recording.  Lora Havens   ECHOCARDIOGRAM COMPLETE  Result Date: 03/07/2020    ECHOCARDIOGRAM REPORT   Patient Name:   Max Pittman. Date of Exam: 03/07/2020 Medical Rec #:  [id number]            Height:       69.0 in Accession #:    [id number]  Weight:       348.3 lb Date of Birth:  10-19-1975            BSA:          2.615 m Patient Age:    4 years             BP:           134/65 mmHg Patient Gender: M                    HR:           74 bpm. Exam Location:  Inpatient Procedure: 2D Echo, Cardiac Doppler, Color Doppler and Intracardiac            Opacification Agent Indications:    Cardiac arrest  History:        Patient has prior history of Echocardiogram examinations, most                 recent 07/19/2019. CAD; Risk Factors:Sleep Apnea and Diabetes.                 ESRD. H/O DVT.  Sonographer:    Clayton Lefort RDCS (AE) Referring Phys: [id number] [facility] P DESAI  Sonographer Comments: Echo performed with patient supine and on artificial respirator and patient is morbidly obese. Image acquisition challenging due to patient body habitus. IMPRESSIONS  1. Left  ventricular ejection fraction, by estimation, is 55 to 60%. The left ventricle has normal function. The left ventricle has no regional wall motion abnormalities. There is moderate concentric left ventricular hypertrophy. Left ventricular diastolic parameters are indeterminate.  2. Right ventricular systolic function is normal. The right ventricular size is normal.  3. The mitral valve is normal in structure. Trivial mitral valve regurgitation. No evidence of mitral stenosis.  4. The aortic valve is grossly normal. Aortic valve regurgitation is not visualized. No aortic stenosis is present. Comparison(s): No significant change from prior study. Conclusion(s)/Recommendation(s): Otherwise normal echocardiogram, with minor abnormalities described in the report. FINDINGS  Left Ventricle: Left ventricular ejection fraction, by estimation, is 55 to 60%. The left ventricle has normal function. The left ventricle has no regional wall motion abnormalities. Definity contrast agent was given IV to delineate the left ventricular  endocardial borders. The left ventricular internal cavity size was normal in size. There is moderate concentric left ventricular hypertrophy. Left ventricular diastolic parameters are indeterminate. Right Ventricle: The right ventricular size is normal. No increase in right ventricular wall thickness. Right ventricular systolic function is normal. Left Atrium: Left atrial size was normal in size. Right Atrium: Right atrial size was normal in size. Pericardium: Trivial pericardial effusion is present. Presence of pericardial fat pad. Mitral Valve: The mitral valve is normal in structure. Trivial mitral valve regurgitation. No evidence of mitral valve stenosis. MV peak gradient, 5.5 mmHg. The mean mitral valve gradient is 2.0 mmHg. Tricuspid Valve: The tricuspid valve is normal in structure. Tricuspid valve regurgitation is trivial. No evidence of tricuspid stenosis. Aortic Valve: The aortic valve is  grossly normal. Aortic valve regurgitation is not visualized. No aortic stenosis is present. Aortic valve mean gradient measures 3.0 mmHg. Aortic valve peak gradient measures 5.3 mmHg. Aortic valve area, by VTI measures 3.05 cm. Pulmonic Valve: The pulmonic valve was not well visualized. Pulmonic valve regurgitation is not visualized. No evidence of pulmonic stenosis. Aorta: The aortic arch was not well visualized and the aortic root and ascending aorta are structurally normal, with no evidence of dilitation. Venous:  IVC assessment for right atrial pressure unable to be performed due to mechanical ventilation. IAS/Shunts: The atrial septum is grossly normal. Additional Comments: There is a small pleural effusion in both left and right lateral regions.  LEFT VENTRICLE PLAX 2D LVIDd:         4.50 cm  Diastology LVIDs:         3.30 cm  LV e' medial:    5.98 cm/s LV PW:         1.50 cm  LV E/e' medial:  14.4 LV IVS:        1.40 cm  LV e' lateral:   7.72 cm/s LVOT diam:     2.20 cm  LV E/e' lateral: 11.2 LV SV:         67 LV SV Index:   26 LVOT Area:     3.80 cm  RIGHT VENTRICLE            IVC RV Basal diam:  2.90 cm    IVC diam: 2.80 cm RV S prime:     9.68 cm/s TAPSE (M-mode): 2.0 cm LEFT ATRIUM             Index       RIGHT ATRIUM           Index LA diam:        3.60 cm 1.38 cm/m  RA Area:     16.20 cm LA Vol (A2C):   71.7 ml 27.41 ml/m RA Volume:   35.50 ml  13.57 ml/m LA Vol (A4C):   48.5 ml 18.54 ml/m LA Biplane Vol: 64.3 ml 24.59 ml/m  AORTIC VALVE AV Area (Vmax):    2.87 cm AV Area (Vmean):   2.81 cm AV Area (VTI):     3.05 cm AV Vmax:           115.00 cm/s AV Vmean:          74.700 cm/s AV VTI:            0.219 m AV Peak Grad:      5.3 mmHg AV Mean Grad:      3.0 mmHg LVOT Vmax:         86.80 cm/s LVOT Vmean:        55.300 cm/s LVOT VTI:          0.176 m LVOT/AV VTI ratio: 0.80  AORTA Ao Root diam: 3.10 cm Ao Asc diam:  2.60 cm MITRAL VALVE               TRICUSPID VALVE MV Area (PHT): 2.87 cm    TR  Peak grad:   25.8 mmHg MV Area VTI:   2.10 cm    TR Vmax:        254.00 cm/s MV Peak grad:  5.5 mmHg MV Mean grad:  2.0 mmHg    SHUNTS MV Vmax:       1.17 m/s    Systemic VTI:  0.18 m MV Vmean:      63.3 cm/s   Systemic Diam: 2.20 cm MV Decel Time: 264 msec MV E velocity: 86.10 cm/s MV A velocity: 40.30 cm/s MV E/A ratio:  2.14 Buford Dresser MD Electronically signed by Buford Dresser MD Signature Date/Time: 03/07/2020/6:39:05 PM    Final    CUP PACEART REMOTE DEVICE CHECK  Result Date: 03/13/2020 ILR summary report received. Battery status OK. Normal device function. No new symptom, tachy, brady, or pause episodes. No new AF episodes. Monthly summary reports  and ROV/PRN Kathy Breach, RN, CCDS, [employer]  IR TUNNELED CENTRAL VENOUS CATHETER PLACEMENT  Result Date: 03/20/2020 INDICATION: End-stage renal disease EXAM: TUNNELED CENTRAL VENOUS HEMODIALYSIS CATHETER PLACEMENT WITH ULTRASOUND AND FLUOROSCOPIC GUIDANCE MEDICATIONS: Ancef 2 g IV. The antibiotic was given in an appropriate time interval prior to skin puncture. ANESTHESIA/SEDATION: Moderate (conscious) sedation was employed during this procedure. A total of Versed 2 mg and Fentanyl 100 mcg was administered intravenously. Moderate Sedation Time: 10 minutes. The patient's level of consciousness and vital signs were monitored continuously by radiology nursing throughout the procedure under my direct supervision. FLUOROSCOPY TIME:  Fluoroscopy Time: 0 minutes 48 seconds (8 mGy). COMPLICATIONS: None immediate. PROCEDURE: Informed written consent was obtained from the patient after a discussion of the risks, benefits, and alternatives to treatment. Questions regarding the procedure were encouraged and answered. The right neck and chest were prepped with chlorhexidine in a sterile fashion, and a sterile drape was applied covering the operative field. Maximum barrier sterile technique with sterile gowns and gloves were used for the  procedure. A timeout was performed prior to the initiation of the procedure. The right internal jugular vein was evaluated with ultrasound and shown to be patent. A permanent ultrasound image was obtained and placed in the patient's medical record. Using sterile gel and a sterile probe cover, the right internal jugular vein was entered with a 21 ga needle during real time ultrasound guidance. 0.018 inch guidewire placed and 21 ga needle exchanged for transitional dilator set. Utilizing fluoroscopy, 0.035 inch guidewire advanced through the needle without difficulty. Seriel dilation was performed and peel-away sheath was placed. Attention then turned to the right anterior upper chest. Following local lidocaine administration, the hemodialysis catheter was tunneled from the chest wall to the venotomy site. The catheter was inserted through the peel-away sheath. The tip of the catheter was positioned within the right atrium using fluoroscopic guidance. All lumens of the catheter aspirated and flushed well. The dialysis lumens were locked with Heparin. The catheter was secured to the skin with suture. The insertion site was covered with a Biopatch and sterile dressing. Temporary right non tunneled hemodialysis catheter was removed without difficulty and hemostasis achieved with manual compression. IMPRESSION: Successful placement of 23 cm tip to cuff tunneled hemodialysis catheter via the right internal jugular vein with tips terminating within the superior aspect of the right atrium. The catheter is ready for immediate use. Electronically Signed   By: Miachel Roux M.D.   On: 03/20/2020 15:10     Subjective: No new complaints  Discharge Exam: Vitals:   03/26/20 1033 03/26/20 1104  BP: (!) 144/89 (!) 142/90  Pulse: 89 93  Resp: 13 18  Temp: 98.7 F (37.1 C) 97.9 F (36.6 C)  SpO2:  96%   Vitals:   03/26/20 0930 03/26/20 1000 03/26/20 1033 03/26/20 1104  BP: (!) 128/93 (!) 133/93 (!) 144/89 (!) 142/90   Pulse: 90  89 93  Resp: '14 16 13 18  '$ Temp:   98.7 F (37.1 C) 97.9 F (36.6 C)  TempSrc:   Oral Oral  SpO2:    96%  Weight:   117.4 kg   Height:        General: Pt is alert, awake, not in acute distress Cardiovascular: RRR, S1/S2 +, no rubs, no gallops Respiratory: CTA bilaterally, no wheezing, no rhonchi Abdominal: Soft, NT, ND, bowel sounds + Extremities: no edema, no cyanosis    The results of significant diagnostics from this hospitalization (including imaging,  microbiology, ancillary and laboratory) are listed below for reference.     Microbiology: Recent Results (from the past 240 hour(s))  Culture, blood (Routine X 2) w Reflex to ID Panel     Status: None   Collection Time: 03/19/20 10:30 AM   Specimen: BLOOD RIGHT HAND  Result Value Ref Range Status   Specimen Description BLOOD RIGHT HAND  Final   Special Requests   Final    BOTTLES DRAWN AEROBIC ONLY Blood Culture results may not be optimal due to an inadequate volume of blood received in culture bottles   Culture   Final    NO GROWTH 5 DAYS Performed at [facility], [place] [address] [place], [place] [address]    Report Status 03/24/2020 FINAL  Final  Culture, blood (Routine X 2) w Reflex to ID Panel     Status: None   Collection Time: 03/19/20 10:41 AM   Specimen: BLOOD RIGHT WRIST  Result Value Ref Range Status   Specimen Description BLOOD RIGHT WRIST  Final   Special Requests   Final    BOTTLES DRAWN AEROBIC AND ANAEROBIC Blood Culture adequate volume   Culture   Final    NO GROWTH 5 DAYS Performed at [facility], [place] [address] [place], [place] [address]    Report Status 03/24/2020 FINAL  Final  Body fluid culture w Gram Stain     Status: None   Collection Time: 03/20/20  8:12 AM   Specimen: Peritoneal Dialysate; Body Fluid  Result Value Ref Range Status   Specimen Description PERITONEAL DIALYSATE  Final   Special Requests Immunocompromised  Final   Gram Stain   Final    RARE WBC  PRESENT, PREDOMINANTLY MONONUCLEAR NO ORGANISMS SEEN    Culture   Final    NO GROWTH 3 DAYS Performed at [facility], [address], [place],  [address]    Report Status 03/23/2020 FINAL  Final     Labs: BNP (last 3 results) Recent Labs    06/30/19 2240  BNP [id number]*   Basic Metabolic Panel: Recent Labs  Lab 03/20/20 0044 03/21/20 1203 03/23/20 0733 03/26/20 0648  NA 133* 133* 135 137  K 3.7 3.8 4.1 4.2  CL 96* 96* 99 100  CO2 22 21* 23 24  GLUCOSE 156* 97 94 126*  BUN 59* 58* 36* 43*  CREATININE 9.24* 9.64* 8.41* 9.53*  CALCIUM 9.1 8.7* 8.7* 9.1  PHOS  --   --   --  5.3*   Liver Function Tests: Recent Labs  Lab 03/20/20 0044 03/23/20 0733 03/26/20 0648  AST 40 19  --   ALT 15 5  --   ALKPHOS 76 73  --   BILITOT 0.5 0.6  --   PROT 6.7 6.2*  --   ALBUMIN 2.2* 1.9* 2.0*   No results for input(s): LIPASE, AMYLASE in the last 168 hours. No results for input(s): AMMONIA in the last 168 hours. CBC: Recent Labs  Lab 03/21/20 0108 03/22/20 0052 03/23/20 0149 03/24/20 0140 03/25/20 0104 03/26/20 0648  WBC 13.0* 10.9* 12.7* 11.2* 10.4 10.2  NEUTROABS 8.9*  --   --   --   --   --   HGB 7.7* 7.3* 8.8* 8.5* 8.7* 8.5*  HCT 24.7* 22.9* 28.9* 26.5* 29.0* 27.6*  MCV 89.8 90.2 92.3 91.7 92.9 92.9  PLT 457* 468* 499* 431* 414* 435*   Cardiac Enzymes: No results for input(s): CKTOTAL, CKMB, CKMBINDEX, TROPONINI in the last  168 hours. BNP: Invalid input(s): POCBNP CBG: Recent Labs  Lab 03/25/20 1645 03/25/20 2049 03/25/20 2314 03/26/20 0353 03/26/20 1143  GLUCAP 105* 108* 135* 104* 93   D-Dimer No results for input(s): DDIMER in the last 72 hours. Hgb A1c No results for input(s): HGBA1C in the last 72 hours. Lipid Profile No results for input(s): CHOL, HDL, LDLCALC, TRIG, CHOLHDL, LDLDIRECT in the last 72 hours. Thyroid function studies No results for input(s): TSH, T4TOTAL, T3FREE, THYROIDAB in the last 72 hours.  Invalid input(s):  FREET3 Anemia work up No results for input(s): VITAMINB12, FOLATE, FERRITIN, TIBC, IRON, RETICCTPCT in the last 72 hours. Urinalysis    Component Value Date/Time   COLORURINE YELLOW 03/07/2020 0241   APPEARANCEUR CLEAR 03/07/2020 0241   LABSPEC 1.015 03/07/2020 0241   PHURINE 8.5 (H) 03/07/2020 0241   GLUCOSEU >=500 (A) 03/07/2020 0241   HGBUR SMALL (A) 03/07/2020 0241   BILIRUBINUR NEGATIVE 03/07/2020 0241   KETONESUR NEGATIVE 03/07/2020 0241   PROTEINUR >300 (A) 03/07/2020 0241   NITRITE NEGATIVE 03/07/2020 0241   LEUKOCYTESUR NEGATIVE 03/07/2020 0241   Sepsis Labs Invalid input(s): PROCALCITONIN,  WBC,  LACTICIDVEN Microbiology Recent Results (from the past 240 hour(s))  Culture, blood (Routine X 2) w Reflex to ID Panel     Status: None   Collection Time: 03/19/20 10:30 AM   Specimen: BLOOD RIGHT HAND  Result Value Ref Range Status   Specimen Description BLOOD RIGHT HAND  Final   Special Requests   Final    BOTTLES DRAWN AEROBIC ONLY Blood Culture results may not be optimal due to an inadequate volume of blood received in culture bottles   Culture   Final    NO GROWTH 5 DAYS Performed at [facility], [place] [address] [place], [place] [address]    Report Status 03/24/2020 FINAL  Final  Culture, blood (Routine X 2) w Reflex to ID Panel     Status: None   Collection Time: 03/19/20 10:41 AM   Specimen: BLOOD RIGHT WRIST  Result Value Ref Range Status   Specimen Description BLOOD RIGHT WRIST  Final   Special Requests   Final    BOTTLES DRAWN AEROBIC AND ANAEROBIC Blood Culture adequate volume   Culture   Final    NO GROWTH 5 DAYS Performed at [facility], [place] [address], [place], [place] [address]    Report Status 03/24/2020 FINAL  Final  Body fluid culture w Gram Stain     Status: None   Collection Time: 03/20/20  8:12 AM   Specimen: Peritoneal Dialysate; Body Fluid  Result Value Ref Range Status   Specimen Description PERITONEAL DIALYSATE  Final    Special Requests Immunocompromised  Final   Gram Stain   Final    RARE WBC PRESENT, PREDOMINANTLY MONONUCLEAR NO ORGANISMS SEEN    Culture   Final    NO GROWTH 3 DAYS Performed at [facility], [address], [place], [place] [address]    Report Status 03/23/2020 FINAL  Final     Time coordinating discharge: Over 30 minutes  SIGNED:   Charlynne Cousins, MD  [facility] 03/26/2020, 12:46 PM Pager   If 7PM-7AM, please contact night-coverage [email or web address] Password [email or web address]

## 2020-03-26 NOTE — Progress Notes (Signed)
Patient admitted to 5C06 about 1600. Patient alert and oriented to rehab unit and schedule. All questions answered. Fall safety discussed. Call bell left within reach.

## 2020-03-26 NOTE — Procedures (Signed)
Patient was seen on dialysis and the procedure was supervised.  BFR 400  Via TDC BP is  128/93.   Patient appears to be tolerating treatment well  [name] 03/26/2020

## 2020-03-26 NOTE — Progress Notes (Signed)
Patient will have a MWF 6:15am seat time at [facility] when discharged from [facility]. (This seat is available to him starting 04/08/20 or later. If patient will be discharged from CIR sooner than 04/08/20, Navigator will need to arrange another seat in the meantime. This has been explained to patient and CIR [facility].) Navigator will continue to follow and collaborate with CIR CSW, once assigned, while patient is in CIR.  Alphonzo Cruise, [facility] Navigator [phone]

## 2020-03-26 NOTE — Progress Notes (Signed)
Inpatient Rehabilitation  Patient information reviewed and entered into eRehab system by Demosthenes Virnig M. Alhassan Everingham, M.A., CCC/SLP, PPS Coordinator.  Information including medical coding, functional ability and quality indicators will be reviewed and updated through discharge.    

## 2020-03-26 NOTE — Progress Notes (Signed)
[facility] PROGRESS NOTE    Progress Note  Max Pittman.  [id number] DOB: 02-01-1975 DOA: 03/07/2020 PCP: Chesley Noon, MD     Brief Narrative:   Max Pittman. is an 45 y.o. male past medical history of end-stage renal disease on peritoneal dialysis, diabetes mellitus type 2, essential hypertension, DVT on Coumadin sleep apnea comes into the hospital for shortness of breath on arrival to the ED had a cardiac arrest with 12 minutes of CPR before ROSC admitted to the ICU and intubated on 03/07/2020 self extubated on 01/08/2021 transferred to the hospitalist service on 01/11/2021, now requiring CRRT in the ICU.  Was transitioned to hemodialysis, nephrology and IR working on removing his temporary catheter and placing tunneled catheter for permanent dialysis and removing her peritoneal dialysis catheter.  Awaiting inpatient rehab evaluation  Procedures: Tunneled catheter placed on 03/20/2020 by IR. Peritoneal catheter removal on 03/22/2020 by surgery.  Significant studies: CT scan of the abdomen and pelvis done on 03/21/2020 showed large left psoas hematoma ~about 10 x 7 and extending 20 cm in length. Assessment/Plan:   Acute respiratory failure with hypoxia (HCC) leading to cardiac arrest: In the setting of fluid overload, now resolved. Has been weaned to room air denies any shortness of breath. Physical therapy evaluated the patient who recommended inpatient rehab.  End-stage renal disease on peritoneal dialysis/hyperkalemia/hypomagnesemia: Had significantly drop compared to when admission 158kg he weighs 119 kg. Next hemodialysis on 03/26/2020 HD catheter has been working well will need to be set up in clips as an outpatient for dialysis. HD today and tolerating it.  Spontaneous retroperitoneal iliopsoas hematoma Leukocytosis is resolved he has remained afebrile. Hemoglobin has stabilized. He should remain off anticoagulation he is no longer a candidate due  to spontaneous retroperitoneal bleed, beside he had one episode of thromboembolic events and has been on anticoagulation for 6 months which she has completed his treatment. Can probably go back on aspirin in a week.  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 He was hospitalized at that time and completed 6 weeks of IV antibiotics now on suppressive therapy with Keflex. Blood cultures and fluid cultures were drawn on 03/19/2018 have remained negative till date.  Cardiogenic shock: Now resolved.  Obstructive sleep apnea: Noncompliant with CPAP.  Acute metabolic encephalopathy: Probably due to respiratory distress.  Deconditioning: Physical therapy evaluated the patient recommended inpatient rehab.  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 We will discontinue all anticoagulation.  History of CVA/history of seizures: EEG on 03/09/2019 showed no seizure disorder continue Lamictal and Keppra.  Essential hypertension: Continue amlodipine and metoprolol. Add low-dose Imdur his blood pressure continues to be significantly elevated.  Normocytic anemia: Status post 1 unit of packed red blood cells his hemoglobin today is 8.6. Check a CBC in the morning.   DVT prophylaxis: lovenox Family Communication:none Status is: Inpatient  Remains inpatient appropriate because:Hemodynamically unstable   Dispo: The patient is from: Home              Anticipated d/c is to: SNF              Anticipated d/c date is: 1days              Patient currently is not medically stable to d/c.  Difficult to place patient No  Code Status:     Code Status Orders  (From admission, onward)         Start     Ordered   03/07/20 0440  Full code  Continuous        03/07/20 0441        Code Status  History    Date Active Date Inactive Code Status Order ID Comments User Context   07/13/2019 2250 07/28/2019 2220 Full Code [id number]  Bethena Roys, MD Inpatient   07/01/2019 0254 07/06/2019 2206 Full Code [id number]  Edmonia Lynch, DO ED   06/02/2019 1610 06/17/2019 1520 Full Code [id number]  Elizabeth Sauer Inpatient   06/02/2019 1610 06/02/2019 1610 Full Code [id number]  Elizabeth Sauer Inpatient   05/21/2019 0025 06/02/2019 1557 Full Code [id number]  Danne Baxter Inpatient   03/22/2019 [id number] 03/24/2019 1612 Full Code [id number]  Bethena Roys, MD Inpatient   Advance Care Planning Activity        IV Access:    Peripheral IV   Procedures and diagnostic studies:   No results found.   Medical Consultants:    None.   Subjective:    Max Pittman. no complains  Objective:    Vitals:   03/25/20 1628 03/25/20 2047 03/25/20 2312 03/26/20 0348  BP: 123/90 (!) 142/85 139/85 119/81  Pulse: 90 90 100 92  Resp: '19 20 16 16  '$ Temp: 98.3 F (36.8 C) 98.4 F (36.9 C) 98.6 F (37 C) 98.4 F (36.9 C)  TempSrc: Oral Oral Oral Oral  SpO2: 100% 100% 100% 100%  Weight:      Height:       SpO2: 100 % O2 Flow Rate (L/min): 2 L/min FiO2 (%): 97 %   Intake/Output Summary (Last 24 hours) at 03/26/2020 0838 Last data filed at 03/26/2020 0549 Gross per 24 hour  Intake --  Output 600 ml  Net -600 ml   Filed Weights   03/23/20 0706 03/23/20 1035 03/24/20 0630  Weight: 123 kg 120.2 kg 119.1 kg    Exam: General exam: In no acute distress. Respiratory system: Good air movement and clear to auscultation. Cardiovascular system: S1 & S2 heard, RRR. No JVD. Gastrointestinal system: Abdomen is nondistended, soft and nontender.  Extremities: No pedal edema. Skin: No rashes, lesions or ulcers Psychiatry: Judgement and insight appear normal. Mood & affect appropriate.   Data Reviewed:    Labs: Basic Metabolic Panel: Recent Labs  Lab 03/20/20 0044  03/21/20 1203 03/23/20 0733 03/26/20 0648  NA 133* 133* 135 137  K 3.7 3.8 4.1 4.2  CL 96* 96* 99 100  CO2 22 21* 23 24  GLUCOSE 156* 97 94 126*  BUN 59* 58* 36* 43*  CREATININE 9.24* 9.64* 8.41* 9.53*  CALCIUM 9.1 8.7* 8.7* 9.1  PHOS  --   --   --  5.3*   GFR Estimated Creatinine Clearance: 12.7 mL/min (A) (by C-G formula based on SCr of 9.53 mg/dL (H)). Liver Function Tests: Recent Labs  Lab 03/20/20 0044 03/23/20 0733 03/26/20 0648  AST 40 19  --   ALT 15 5  --   ALKPHOS 76 73  --   BILITOT 0.5 0.6  --   PROT 6.7 6.2*  --   ALBUMIN 2.2* 1.9* 2.0*   No results for input(s): LIPASE, AMYLASE in the last 168 hours. No results for input(s): AMMONIA in the last 168 hours. Coagulation profile Recent Labs  Lab 03/22/20 0052 03/23/20 0149 03/24/20 0140 03/25/20 0104 03/26/20 0139  INR 1.6* 1.4* 1.5* 1.4* 1.3*   COVID-19 Labs  No results for input(s): DDIMER, FERRITIN, LDH, CRP in the last 72 hours.  Lab Results  Component Value Date   SARSCOV2NAA NEGATIVE 03/07/2020   [place] NEGATIVE 07/13/2019   [place] NEGATIVE 06/30/2019   [place] NEGATIVE 05/20/2019    CBC: Recent Labs  Lab 03/21/20 0108 03/22/20 0052 03/23/20 0149 03/24/20 0140 03/25/20 0104 03/26/20 0648  WBC 13.0* 10.9* 12.7* 11.2* 10.4 10.2  NEUTROABS 8.9*  --   --   --   --   --   HGB 7.7* 7.3* 8.8* 8.5* 8.7* 8.5*  HCT 24.7* 22.9* 28.9* 26.5* 29.0* 27.6*  MCV 89.8 90.2 92.3 91.7 92.9 92.9  PLT 457* 468* 499* 431* 414* 435*   Cardiac Enzymes: No results for input(s): CKTOTAL, CKMB, CKMBINDEX, TROPONINI in the last 168 hours. BNP (last 3 results) No results for input(s): PROBNP in the last 8760 hours. CBG: Recent Labs  Lab 03/25/20 1127 03/25/20 1645 03/25/20 2049 03/25/20 2314 03/26/20 0353  GLUCAP 126* 105* 108* 135* 104*   D-Dimer: No results for input(s): DDIMER in the last 72 hours. Hgb A1c: No results for input(s): HGBA1C in the last 72 hours. Lipid  Profile: No results for input(s): CHOL, HDL, LDLCALC, TRIG, CHOLHDL, LDLDIRECT in the last 72 hours. Thyroid function studies: No results for input(s): TSH, T4TOTAL, T3FREE, THYROIDAB in the last 72 hours.  Invalid input(s): FREET3 Anemia work up: No results for input(s): VITAMINB12, FOLATE, FERRITIN, TIBC, IRON, RETICCTPCT in the last 72 hours. Sepsis Labs: Recent Labs  Lab 03/23/20 0149 03/24/20 0140 03/25/20 0104 03/26/20 0648  WBC 12.7* 11.2* 10.4 10.2   Microbiology Recent Results (from the past 240 hour(s))  Culture, blood (Routine X 2) w Reflex to ID Panel     Status: None   Collection Time: 03/19/20 10:30 AM   Specimen: BLOOD RIGHT HAND  Result Value Ref Range Status   Specimen Description BLOOD RIGHT HAND  Final   Special Requests   Final    BOTTLES DRAWN AEROBIC ONLY Blood Culture results may not be optimal due to an inadequate volume of blood received in culture bottles   Culture   Final    NO GROWTH 5 DAYS Performed at [facility], [place] [address], [place], [place] [address]    Report Status 03/24/2020 FINAL  Final  Culture, blood (Routine X 2) w Reflex to ID Panel     Status: None   Collection Time: 03/19/20 10:41 AM   Specimen: BLOOD RIGHT WRIST  Result Value Ref Range Status   Specimen Description BLOOD RIGHT WRIST  Final   Special Requests   Final    BOTTLES DRAWN AEROBIC AND ANAEROBIC Blood Culture adequate volume   Culture   Final    NO GROWTH 5 DAYS Performed at [facility], [place] [address], [place], [place] [address]    Report Status 03/24/2020 FINAL  Final  Body fluid culture w Gram Stain     Status: None   Collection Time: 03/20/20  8:12 AM   Specimen: Peritoneal Dialysate; Body Fluid  Result Value Ref Range Status   Specimen Description PERITONEAL DIALYSATE  Final   Special Requests Immunocompromised  Final   Gram Stain   Final    RARE WBC PRESENT, PREDOMINANTLY MONONUCLEAR NO ORGANISMS SEEN    Culture   Final    NO GROWTH 3  DAYS Performed at  [facility], [place] [address], [place], [place] [address]    Report Status 03/23/2020 FINAL  Final     Medications:   . amLODipine  5 mg Oral Daily  . atorvastatin  40 mg Oral q1800  . buPROPion  150 mg Oral BID WC  . cephALEXin  250 mg Oral Q12H  . Chlorhexidine Gluconate Cloth  6 each Topical Daily  . Chlorhexidine Gluconate Cloth  6 each Topical Q0600  . [START ON 03/28/2020] darbepoetin (ARANESP) injection - DIALYSIS  100 mcg Intravenous Q Thu-HD  . docusate sodium  100 mg Oral BID  . feeding supplement (NEPRO CARB STEADY)  237 mL Oral TID BM  . FLUoxetine  10 mg Oral Daily  . isosorbide mononitrate  15 mg Oral Daily  . lamoTRIgine  100 mg Oral BID  . levETIRAcetam  500 mg Oral Daily  . loratadine  10 mg Oral Daily  . mouth rinse  15 mL Mouth Rinse BID  . melatonin  5 mg Oral QHS  . metoprolol tartrate  12.5 mg Oral BID  . multivitamin  1 tablet Oral QHS  . pantoprazole  40 mg Oral Daily  . polyethylene glycol  17 g Oral Daily  . pregabalin  75 mg Oral QHS  . sevelamer carbonate  2,400 mg Oral TID WC  . sodium chloride flush  10-40 mL Intracatheter Q12H   Continuous Infusions: . sodium chloride    . sodium chloride        LOS: 19 days   Charlynne Cousins  [facility]  03/26/2020, 8:38 AM

## 2020-03-26 NOTE — Progress Notes (Signed)
Patient ID: Max Pittman., male   DOB: [birth year], 45 y.o.   MRN: [id number] [facility] Progress Note   Assessment/ Plan:   1.  Acute hypoxic respiratory failure/ cardiac arrest: due to severe volume overload/pulmonary edema causing asystolic cardiac arrest. Resolved w/ CRRT.  2. ESRD: severe vol overload on admission. Had CRRT w/ UF of 31 L. Suspected membrane failure so cannot do PD anymore. Transitioning to HD. IR placed [place] on 2/23. PD cath removed 2/25 per gen surgery. TTS HD here, next HD due 3/3. Is interested in home hemo-  Usually we want a fistula for that - did talk about that briefly but may be too soon right now given what he has been through.  Now may be going to inpatient rehab- will look to get it done toward the end of his stay 3. Retroperitoneal hematoma: seen by CT 2/24 large R iliopsoas hematoma 10 x 7 x 20 cm. Anticoagulation on hold.  4. Anemia ckd: Hb in 8- 9 range, sp darbe 100 ug  q thurs going ahead.   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 Nutrition: On renal diet  7. Hypertension: no vol ^ on exam, holding wt's high 110 kg's. Continues on norvasc/ metoprolol.    8. 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 H/o CVA/ hx seizures - taking lamictal and keppra 10. Dispo - Per PT needs SNF placement for rehab -  Now thinking inpatient rehab    Max Pittman  03/26/2020, 9:51 AM    Subjective:   Pt seen on HD- no c/o's    Objective:   BP (!) 152/91   Pulse 89   Temp 98.4 F (36.9 C) (Oral)   Resp 16   Ht '5\' 9"'$  (1.753 m)   Wt 119.4 kg   SpO2 100%   BMI 38.87 kg/m   Physical Exam: Gen: Resting comfortably in bed CVS: RRR no mrg   Resp: CTA, no rales or wheezing Abd: Soft, obese, nontender, pd cath removed Ext: no LE or UE edema  Thrombosed LUA AVF   R IJ [facility]    Labs: BMET Recent Labs  Lab 03/20/20 0044 03/21/20 1203 03/23/20 0733 03/26/20 0648  NA 133* 133* 135 137   K 3.7 3.8 4.1 4.2  CL 96* 96* 99 100  CO2 22 21* 23 24  GLUCOSE 156* 97 94 126*  BUN 59* 58* 36* 43*  CREATININE 9.24* 9.64* 8.41* 9.53*  CALCIUM 9.1 8.7* 8.7* 9.1  PHOS  --   --   --  5.3*   CBC Recent Labs  Lab 03/21/20 0108 03/22/20 0052 03/23/20 0149 03/24/20 0140 03/25/20 0104 03/26/20 0648  WBC 13.0*   < > 12.7* 11.2* 10.4 10.2  NEUTROABS 8.9*  --   --   --   --   --   HGB 7.7*   < > 8.8* 8.5* 8.7* 8.5*  HCT 24.7*   < > 28.9* 26.5* 29.0* 27.6*  MCV 89.8   < > 92.3 91.7 92.9 92.9  PLT 457*   < > 499* 431* 414* 435*   < > = values in this interval not displayed.     Medications:    . amLODipine  5 mg Oral Daily  . atorvastatin  40 mg Oral q1800  . buPROPion  150 mg Oral BID WC  . cephALEXin  250 mg Oral Q12H  . Chlorhexidine Gluconate Cloth  6 each Topical Daily  . Chlorhexidine Gluconate Cloth  6 each Topical Q0600  . [START ON 03/28/2020] darbepoetin (ARANESP) injection - DIALYSIS  100 mcg Intravenous Q Thu-HD  . docusate sodium  100 mg Oral BID  . feeding supplement (NEPRO CARB STEADY)  237 mL Oral TID BM  . FLUoxetine  10 mg Oral Daily  . isosorbide mononitrate  15 mg Oral Daily  . lamoTRIgine  100 mg Oral BID  . levETIRAcetam  500 mg Oral Daily  . loratadine  10 mg Oral Daily  . mouth rinse  15 mL Mouth Rinse BID  . melatonin  5 mg Oral QHS  . metoprolol tartrate  12.5 mg Oral BID  . multivitamin  1 tablet Oral QHS  . pantoprazole  40 mg Oral Daily  . polyethylene glycol  17 g Oral Daily  . pregabalin  75 mg Oral QHS  . sevelamer carbonate  2,400 mg Oral TID WC  . sodium chloride flush  10-40 mL Intracatheter Q12H

## 2020-03-26 NOTE — Progress Notes (Signed)
Inpatient Rehab Admissions Coordinator:    I have a bed available for pt to admit to CIR today. Dr. Olevia Bowens in agreement.  Will let pt/family and TOC team know.   Shann Medal, PT, DPT Admissions Coordinator [phone] 03/26/20  10:33 AM

## 2020-03-26 NOTE — Progress Notes (Signed)
PT Cancellation Note  Patient Details Name: Max Pittman. MRN: [id number] DOB: [birth year]   Cancelled Treatment:    Reason Eval/Treat Not Completed: Patient at procedure or test/unavailable. Pt currently in HD. Will check back as schedule allows to continue with PT POC.    Thelma Comp 03/26/2020, 7:40 AM   Rolinda Roan, PT, DPT Acute Rehabilitation Services Pager: [phone] Office: [phone]

## 2020-03-26 NOTE — H&P (Signed)
Physical Medicine and Rehabilitation Admission H&P        Chief Complaint  Patient presents with  . Debility      HPI: Max Pittman is a 45 year old male with history of T2DM, HTN, ESRD-on PD, CVA, seizure disorder, chronic hip infection--on Keflex for suppression, DVT who was admitted on 03/07/20 with with cardiac arrest due to "massive volume overload". He required 12 minutes of CPR with ROSC, was intubated in ED and treated with cooling protocol. Per reports, patient was having difficulty with PD prior to admission and IJ placed by PCCM with CRRT for management of fluid overload. Asystolic arrest felt to be due to fluid overload and EEG done showing evidence of diffuse encephalopathy without seizures. He was treated with IV antibiotics due purulent secretions/ concerns of aspiration PNA as well as pressors for BP support.  He self extubated on 02/14 and did not tolerate BIPAP but respiratory status stable. Mentation improved and he was started on regular diet.    He had recurrent rise in WBC and was started on Ancef 02/22--BCX 2 were negative. Tunneled IJ catheter placed by Dr. Sharen Heck.  Nephrology questioned compliance with PD v/s failure and he has been transitioned to HD.  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 He was transfused with one unit PRBC for acute on chronic anemia with drop in H/H to 7.3/22.9-->ASA and coumadin d/c as he has completed >6 months treatment for DVT. PD catheter removed by Dr. Elias Else.  He is tolerating HD and plans for fistula placement towards end of rehab. Therapy has been ongoing working on pre-gait activity. Patient noted to be debilitated with RLE>LLE pain with weakness and decreased coordination, knee instability and nausea with standing  attempts. CIR recommended due to functional decline.      Review of Systems  Constitutional: Negative for chills and fever.  HENT: Negative for hearing loss and tinnitus.   Eyes: Negative for blurred vision and double vision.  Respiratory: Negative for cough and shortness of breath.   Cardiovascular: Positive for chest pain (mid nipple line since CPR).  Gastrointestinal: Positive for heartburn and nausea (before BM and when getting up). Negative for constipation.  Genitourinary: Negative for dysuria and urgency.  Musculoskeletal: Positive for back pain and joint pain.  Skin: Negative for itching and rash.  Neurological: Positive for sensory change (RLE numb from hip to foot.  chronic numbness bilateral feet. ) and focal weakness (RLE with new weakness).  Psychiatric/Behavioral: The patient is not nervous/anxious.       Past Medical History:  Diagnosis Date  . Arthritis    . Closed dislocation of left hip ([place]) 05/21/2019  . Diabetes ([place])    . Diabetes mellitus without complication ([place])    . DVT (deep venous thrombosis) ([place])    . History of peritoneal dialysis    . Hypertension    . Renal disorder      FFGS  . Renal disorder    . Sleep apnea    . Stroke [facility])      when ha was a child  . Vasovagal syndrome      with syncope           Past Surgical History:  Procedure Laterality Date  . AMPUTATION  Right 07/21/2019    Procedure: Right index finger amputation as necessary at distal interphalangeal joint;  Surgeon: Roseanne Kaufman, MD;  Location: [place];  Service: Orthopedics;  Laterality: Right;  60 mins  . APPLICATION OF WOUND VAC Left 05/22/2019    Procedure: APPLICATION OF WOUND VAC  LEFT HIP;  Surgeon: Altamese Willow Lake, MD;  Location: [place];  Service: Orthopedics;  Laterality: Left;  . APPLICATION OF WOUND VAC Left 07/17/2019    Procedure: WOUND VAC CHANGE;  Surgeon: Altamese Gordonsville, MD;  Location: [place];  Service: Orthopedics;  Laterality: Left;  . AV FISTULA INSERTION W/ RF  MAGNETIC GUIDANCE Left    . AV FISTULA PLACEMENT      . BUBBLE STUDY   05/25/2019    Procedure: BUBBLE STUDY;  Surgeon: Dorothy Spark, MD;  Location: [place];  Service: Cardiovascular;;  . CHOLECYSTECTOMY      . HERNIA REPAIR      . HIP CLOSED REDUCTION Left 05/20/2019    Procedure: CLOSED REDUCTION HIP WITH TRACTION PIN APPLICATION;  Surgeon: Altamese Bent, MD;  Location: [place];  Service: Orthopedics;  Laterality: Left;  . INCISION AND DRAINAGE HIP Left 07/14/2019    Procedure: IRRIGATION AND DEBRIDEMENT HIP;  Surgeon: Altamese Peralta, MD;  Location: [place];  Service: Orthopedics;  Laterality: Left;  . INCISION AND DRAINAGE HIP Left 07/17/2019    Procedure: REPEAT IRRIGATION AND DEBRIDEMENT LEFT HIP;  Surgeon: Altamese Branchdale, MD;  Location: [place];  Service: Orthopedics;  Laterality: Left;  . IR FLUORO GUIDE CV LINE RIGHT   05/23/2019  . IR FLUORO GUIDE CV LINE RIGHT   07/21/2019  . IR PERC TUN PERIT CATH WO PORT S&I Dartha Lodge   03/20/2020  . IR REMOVAL TUN CV CATH W/O FL   08/29/2019  . IR US GUIDE VASC ACCESS RIGHT   05/23/2019  . IR US GUIDE VASC ACCESS RIGHT   07/21/2019  . IR US GUIDE VASC ACCESS RIGHT   03/20/2020  . LEFT HEART CATH AND CORONARY ANGIOGRAPHY N/A 03/22/2019    Procedure: LEFT HEART CATH AND CORONARY ANGIOGRAPHY;  Surgeon: Wellington Hampshire, MD;  Location: [place] [facility];  Service: Cardiovascular;  Laterality: N/A;  . LOOP RECORDER INSERTION N/A 05/25/2019    Procedure: LOOP RECORDER INSERTION;  Surgeon: [name], MD;  Location: [place] CV [facility];  Service: Cardiovascular;  Laterality: N/A;  . MINOR REMOVAL OF PERITONEAL DIALYSIS CATHETER N/A 03/22/2020    Procedure: REMOVAL OF PERITONEAL DIALYSIS CATHETER;  Surgeon: Kieth Brightly, Arta Bruce, MD;  Location: [place];  Service: General;  Laterality: N/A;  ROOM 1 STATING AT 11:00AM FOR 60 MIN  . ORIF ACETABULAR FRACTURE Left 05/22/2019    Procedure: OPEN REDUCTION INTERNAL FIXATION (ORIF) T TYPE  WITH ASSOCIATED POSTERIOR WALL  ACETABULAR FRACTURE, LEFT; REMOVAL OF TRACTION PIN LEFT TIBIA;  Surgeon: Altamese Connerville, MD;  Location: [place];  Service: Orthopedics;  Laterality: Left;  . REMOVAL OF A DIALYSIS CATHETER Right 07/17/2019    Procedure: REMOVAL OF HEMODIALYSIS DIALYSIS CATHETER;  Surgeon: Altamese , MD;  Location: [place];  Service: Orthopedics;  Laterality: Right;  . TEE WITHOUT CARDIOVERSION N/A 05/25/2019    Procedure: TRANSESOPHAGEAL ECHOCARDIOGRAM (TEE);  Surgeon: Dorothy Spark, MD;  Location: [facility] ENDOSCOPY;  Service: Cardiovascular;  Laterality: N/A;  . TEE WITHOUT CARDIOVERSION N/A 07/19/2019    Procedure: TRANSESOPHAGEAL ECHOCARDIOGRAM (TEE);  Surgeon: Sueanne Margarita, MD;  Location: [place];  Service: Cardiovascular;  Laterality: N/A;  . TONSILLECTOMY  Family History  Problem Relation Age of Onset  . Heart disease Mother    . Diabetes Mother    . Hypertension Mother    . Diabetes Father    . Hypertension Father    . CAD Mother          "angina"  . Hypercholesterolemia Mother    . Hypercholesterolemia Father    . Healthy Brother        Social History: Lives with girlfriend and granddaughter. He reports that he quit smoking about 16 months ago. He has never used smokeless tobacco. He reports current alcohol use--couple of ounces of beer occasionally. He reports that he does not use drugs.           Allergies  Allergen Reactions  . Doxycycline Hives  . Latex Swelling      Pt reports swelling at site.    . Morphine And Related Anxiety      Medications Prior to Admission  Medication Sig Dispense Refill  . acetaminophen (TYLENOL) 325 MG tablet Take 2 tablets (650 mg total) by mouth every 8 (eight) hours. (Patient taking differently: Take 650 mg by mouth every 6 (six) hours as needed for mild pain.)      . amLODipine (NORVASC) 5 MG tablet Take 1 tablet (5 mg total) by mouth daily. 30 tablet 0  . aspirin EC 81 MG EC tablet Take 1 tablet (81 mg total) by mouth daily.       Marland Kitchen atorvastatin (LIPITOR) 40 MG tablet Take 1 tablet (40 mg total) by mouth daily at 6 PM. 31 tablet 6  . baclofen (LIORESAL) 10 MG tablet Take 10 mg by mouth 3 (three) times daily.      Marland Kitchen buPROPion (WELLBUTRIN SR) 150 MG 12 hr tablet Take 1 tablet (150 mg total) by mouth 2 (two) times daily with a meal. 60 tablet 0  . docusate sodium (COLACE) 100 MG capsule Take 300 mg by mouth daily as needed for mild constipation or moderate constipation. As directed       . FLUoxetine (PROZAC) 10 MG tablet Take 10 mg by mouth daily.      . furosemide (LASIX) 40 MG tablet Take 120 mg by mouth 2 (two) times daily.      . hydrOXYzine (ATARAX/VISTARIL) 25 MG tablet Take 25 mg by mouth every 8 (eight) hours as needed for itching.      Marland Kitchen KLOR-CON M20 20 MEQ tablet Take 20 mEq by mouth daily.      Marland Kitchen lamoTRIgine (LAMICTAL) 100 MG tablet Take 1 tablet (100 mg total) by mouth 2 (two) times daily. 60 tablet 3  . levETIRAcetam (KEPPRA) 500 MG tablet Take 1 tablet (500 mg total) by mouth daily. (Patient taking differently: Take 500 mg by mouth at bedtime.) 90 tablet 3  . loratadine (CLARITIN) 10 MG tablet Take 1 tablet (10 mg total) by mouth daily. 30 tablet 0  . metoprolol tartrate (LOPRESSOR) 25 MG tablet Take 0.5 tablets (12.5 mg total) by mouth 2 (two) times daily. 30 tablet 0  . omeprazole (PRILOSEC) 40 MG capsule Take 40 mg by mouth daily.      Marland Kitchen oxyCODONE-acetaminophen (PERCOCET/ROXICET) 5-325 MG tablet Take 1 tablet by mouth every 6 (six) hours as needed for pain.      . pregabalin (LYRICA) 50 MG capsule Take 1 capsule (50 mg total) by mouth at bedtime. (Patient taking differently: Take 50 mg by mouth 3 (three) times daily.) 30 capsule 0  . sevelamer carbonate (RENVELA)  800 MG tablet Take 3 tablets (2,400 mg total) by mouth 3 (three) times daily with meals. 240 tablet 1  . warfarin (COUMADIN) 2 MG tablet Take 2 tablets daily except 1 1/2 tablets on Sundays and Wednesdays or as directed (Patient taking differently:  Take 3-4 mg by mouth See admin instructions. Take 2 tablets ( 4 mg) daily except 1 1/2 tablets  (3 mg) on Sundays and Wednesdays or as directed) 60 tablet 4  . gentamicin cream (GARAMYCIN) 0.1 % Apply 1 application topically daily. (Patient not taking: Reported on 03/07/2020) 15 g 0  . pioglitazone (ACTOS) 15 MG tablet Take 1 tablet (15 mg total) by mouth daily. (Patient not taking: No sig reported) 30 tablet 0  . potassium chloride (KLOR-CON) 10 MEQ tablet Take 2 tablets (20 mEq total) by mouth daily. (Patient not taking: Reported on 03/07/2020) 30 tablet 0      Drug Regimen Review  Drug regimen was reviewed and remains appropriate with no significant issues identified   Home: Home Living Family/patient expects to be discharged to:: Private residence Living Arrangements: Spouse/significant other Available Help at Discharge: Available 24 hours/day (girlfriend, [name]) Type of Home: Mobile home Home Access: Stairs to enter [employer] of Steps: 3 Entrance Stairs-Rails: Left Home Layout: One level Bathroom Shower/Tub: Chiropodist: Standard Home Equipment: Environmental consultant - 2 wheels,Bedside Neurosurgeon: Reacher,Sock aid Additional Comments: Pt inconsistent when providing information about home   Functional History: Prior Function Level of Independence: Needs assistance Gait / Transfers Assistance Needed: pt reports ambulating with RW at home ADL's / Homemaking Assistance Needed: Able to perform ADLs with some difficulty using AE. Girlfriend performs IADLs. Was a truck driver Comments: Inconsistent when providing information. Poor historian and requiring increased time.   Functional Status:  Mobility: Bed Mobility Overal bed mobility: Needs Assistance Bed Mobility: Supine to Sit Rolling: Supervision Sidelying to sit: Min guard Supine to sit: HOB elevated,Supervision Sit to supine: Min guard Sit to sidelying: Min assist General  bed mobility comments: OOB on entry Transfers Overall transfer level: Needs assistance Equipment used: Rolling walker (2 wheeled) Transfer via Lift Equipment: Stedy Transfers: Sit to/from Building services engineer Sit to Stand: Min assist (x2 from recliner) Stand pivot transfers: Min assist,+2 physical assistance  Lateral/Scoot Transfers: Supervision (4 feet down in the bed to position for STEDY) General transfer comment: min A for power up, vc for hand placement, and hip rotation and knee extension to achieve fully upright, on second bout of standing pt with increased nausea, requesting nausea medication Ambulation/Gait Ambulation/Gait assistance: Min assist Gait Distance (Feet): 4 Feet Assistive device:  (STEDY) Gait Pattern/deviations: Step-to pattern General Gait Details: pt marched in place for 20 steps in place each leg with exaggerated though relatively low steps  (times 2 sets over 2 standing trials) Gait velocity: slowed Gait velocity interpretation: <1.31 ft/sec, indicative of household ambulator   ADL: ADL Overall ADL's : Needs assistance/impaired Eating/Feeding: Set up,Bed level,Supervision/ safety Grooming: Oral care,Sitting,Supervision/safety,Set up Grooming Details (indicate cue type and reason): from recliner Upper Body Bathing: Sitting,Supervision/ safety Lower Body Bathing: Total assistance,Sitting/lateral leans Lower Body Bathing Details (indicate cue type and reason): simulated via LB Dresssing Upper Body Dressing : Minimal assistance,Sitting Upper Body Dressing Details (indicate cue type and reason): to don posterior gown Lower Body Dressing: Total assistance,Bed level Lower Body Dressing Details (indicate cue type and reason): to don socks from bed level Toilet Transfer: Minimal assistance,RW,Stand-pivot,+2 for physical assistance Toilet Transfer Details (indicate cue type and reason): simulated  via simulated toilet transfer from EOB>recliner with RW and min  A +2. Functional mobility during ADLs: Minimal assistance,+2 for physical assistance,Rolling walker General ADL Comments: pt with improvements in pain and requries less assist for functional mobility this session,pt able to progress OOB to recliner this session for ADL participation   Cognition: Cognition Overall Cognitive Status: Within Functional Limits for tasks assessed Orientation Level: Oriented X4 Cognition Arousal/Alertness: Awake/alert Behavior During Therapy: WFL for tasks assessed/performed Overall Cognitive Status: Within Functional Limits for tasks assessed Area of Impairment: Safety/judgement,Memory,Following commands,Awareness,Problem solving Orientation Level: Disoriented to,Situation Memory: Decreased short-term memory Following Commands: Follows one step commands with increased time,Follows multi-step commands inconsistently Safety/Judgement: Decreased awareness of deficits Awareness: Intellectual Problem Solving: Requires verbal cues,Slow processing General Comments: pt with decreased insight but slightly more awareness today, thinking about rehab (previously didn't want anything except HHPT)      Blood pressure (!) 133/93, pulse 90, temperature 98.4 F (36.9 C), temperature source Oral, resp. rate 16, height '5\' 9"'$  (1.753 m), weight 119.4 kg, SpO2 100 %. Physical Exam Vitals and nursing note reviewed.  Constitutional:      General: He is not in acute distress.    Appearance: He is obese.  HENT:     Head: Normocephalic and atraumatic.     Right Ear: External ear normal.     Left Ear: External ear normal.     Nose: Nose normal. No congestion.     Mouth/Throat:     Mouth: Mucous membranes are moist.     Pharynx: Oropharynx is clear.  Eyes:     Conjunctiva/sclera: Conjunctivae normal.     Pupils: Pupils are equal, round, and reactive to light.  Cardiovascular:     Rate and Rhythm: Normal rate and regular rhythm.     Heart sounds: No murmur heard. No gallop.    Pulmonary:     Effort: Pulmonary effort is normal. No respiratory distress.     Breath sounds: No wheezing, rhonchi or rales.  Abdominal:     General: Bowel sounds are normal. There is no distension.     Palpations: Abdomen is soft. There is no mass.     Tenderness: There is no abdominal tenderness.     Comments: Wounds from PD cath removal present, clean, intact  Musculoskeletal:        General: Swelling (right thigh) and tenderness (right thigh, hip as well as left hip) present.     Cervical back: Normal range of motion.  Skin:    General: Skin is warm and dry.     Comments: Left > right shin with pock marked healed lesions.   Neurological:     Mental Status: He is alert and oriented to person, place, and time. Mental status is at baseline.     Cranial Nerves: No cranial nerve deficit.     Comments: Motor 4+/5 UE. RLE limited by pain 2/5 prox to 4/5 distally. LLE 3/5 prox to 4/5 distally. Distal LE sensory loss to LT. DTR's 1+  Psychiatric:        Mood and Affect: Mood normal.        Behavior: Behavior normal.        Thought Content: Thought content normal.        Lab Results Last 48 Hours        Results for orders placed or performed during the hospital encounter of 03/07/20 (from the past 48 hour(s))  Glucose, capillary     Status: None    Collection Time:  03/24/20 12:34 PM  Result Value Ref Range    Glucose-Capillary 92 70 - 99 mg/dL      Comment: Glucose reference range applies only to samples taken after fasting for at least 8 hours.  Glucose, capillary     Status: Abnormal    Collection Time: 03/24/20  5:47 PM  Result Value Ref Range    Glucose-Capillary 104 (H) 70 - 99 mg/dL      Comment: Glucose reference range applies only to samples taken after fasting for at least 8 hours.  Glucose, capillary     Status: None    Collection Time: 03/24/20  9:08 PM  Result Value Ref Range    Glucose-Capillary 90 70 - 99 mg/dL      Comment: Glucose reference range applies only to  samples taken after fasting for at least 8 hours.  Glucose, capillary     Status: None    Collection Time: 03/24/20 11:34 PM  Result Value Ref Range    Glucose-Capillary 75 70 - 99 mg/dL      Comment: Glucose reference range applies only to samples taken after fasting for at least 8 hours.  Protime-INR     Status: Abnormal    Collection Time: 03/25/20  1:04 AM  Result Value Ref Range    Prothrombin Time 16.6 (H) 11.4 - 15.2 seconds    INR 1.4 (H) 0.8 - 1.2      Comment: (NOTE) INR goal varies based on device and disease states. Performed at [facility], [place] [address], [place], [place] [address]    CBC     Status: Abnormal    Collection Time: 03/25/20  1:04 AM  Result Value Ref Range    WBC 10.4 4.0 - 10.5 K/uL    RBC 3.12 (L) 4.22 - 5.81 MIL/uL    Hemoglobin 8.7 (L) 13.0 - 17.0 g/dL    HCT 29.0 (L) 39.0 - 52.0 %    MCV 92.9 80.0 - 100.0 fL    MCH 27.9 26.0 - 34.0 pg    MCHC 30.0 30.0 - 36.0 g/dL    RDW 16.5 (H) 11.5 - 15.5 %    Platelets 414 (H) 150 - 400 K/uL    nRBC 0.2 0.0 - 0.2 %      Comment: Performed at [facility] [address], [place], [place] [address]  Glucose, capillary     Status: Abnormal    Collection Time: 03/25/20  3:27 AM  Result Value Ref Range    Glucose-Capillary 111 (H) 70 - 99 mg/dL      Comment: Glucose reference range applies only to samples taken after fasting for at least 8 hours.  Glucose, capillary     Status: None    Collection Time: 03/25/20  8:03 AM  Result Value Ref Range    Glucose-Capillary 95 70 - 99 mg/dL      Comment: Glucose reference range applies only to samples taken after fasting for at least 8 hours.  Glucose, capillary     Status: Abnormal    Collection Time: 03/25/20 11:27 AM  Result Value Ref Range    Glucose-Capillary 126 (H) 70 - 99 mg/dL      Comment: Glucose reference range applies only to samples taken after fasting for at least 8 hours.  Glucose, capillary     Status: Abnormal    Collection Time:  03/25/20  4:45 PM  Result Value Ref Range    Glucose-Capillary 105 (H) 70 - 99  mg/dL      Comment: Glucose reference range applies only to samples taken after fasting for at least 8 hours.  Glucose, capillary     Status: Abnormal    Collection Time: 03/25/20  8:49 PM  Result Value Ref Range    Glucose-Capillary 108 (H) 70 - 99 mg/dL      Comment: Glucose reference range applies only to samples taken after fasting for at least 8 hours.  Glucose, capillary     Status: Abnormal    Collection Time: 03/25/20 11:14 PM  Result Value Ref Range    Glucose-Capillary 135 (H) 70 - 99 mg/dL      Comment: Glucose reference range applies only to samples taken after fasting for at least 8 hours.  Protime-INR     Status: Abnormal    Collection Time: 03/26/20  1:39 AM  Result Value Ref Range    Prothrombin Time 15.4 (H) 11.4 - 15.2 seconds    INR 1.3 (H) 0.8 - 1.2      Comment: (NOTE) INR goal varies based on device and disease states. Performed at [facility], [place] [address], [place], [place] [address]    Glucose, capillary     Status: Abnormal    Collection Time: 03/26/20  3:53 AM  Result Value Ref Range    Glucose-Capillary 104 (H) 70 - 99 mg/dL      Comment: Glucose reference range applies only to samples taken after fasting for at least 8 hours.  Renal function panel     Status: Abnormal    Collection Time: 03/26/20  6:48 AM  Result Value Ref Range    Sodium 137 135 - 145 mmol/L    Potassium 4.2 3.5 - 5.1 mmol/L    Chloride 100 98 - 111 mmol/L    CO2 24 22 - 32 mmol/L    Glucose, Bld 126 (H) 70 - 99 mg/dL      Comment: Glucose reference range applies only to samples taken after fasting for at least 8 hours.    BUN 43 (H) 6 - 20 mg/dL    Creatinine, Ser 9.53 (H) 0.61 - 1.24 mg/dL    Calcium 9.1 8.9 - 10.3 mg/dL    Phosphorus 5.3 (H) 2.5 - 4.6 mg/dL    Albumin 2.0 (L) 3.5 - 5.0 g/dL    GFR, Estimated 6 (L) >60 mL/min      Comment: (NOTE) Calculated using the CKD-EPI Creatinine  Equation (2021)      Anion gap 13 5 - 15      Comment: Performed at [facility] [address], [place], [place] [address]  CBC     Status: Abnormal    Collection Time: 03/26/20  6:48 AM  Result Value Ref Range    WBC 10.2 4.0 - 10.5 K/uL    RBC 2.97 (L) 4.22 - 5.81 MIL/uL    Hemoglobin 8.5 (L) 13.0 - 17.0 g/dL    HCT 27.6 (L) 39.0 - 52.0 %    MCV 92.9 80.0 - 100.0 fL    MCH 28.6 26.0 - 34.0 pg    MCHC 30.8 30.0 - 36.0 g/dL    RDW 16.3 (H) 11.5 - 15.5 %    Platelets 435 (H) 150 - 400 K/uL    nRBC 0.0 0.0 - 0.2 %      Comment: Performed at [facility] [address], [place], [place] [address]      Imaging Results (Last 48 hours)  No  results found.           Medical Problem List and Plan: 1.  Functional and mobility deficits secondary to debility after cardiac arrest, volume overload multiple medical.              -patient may shower             -ELOS/Goals: 18-20 days, supervision to min assist with self-care and w/c level mobility  2.  Antithrombotics: -DVT/anticoagulation:  Mechanical: Sequential compression devices, below knee Bilateral lower extremities             -antiplatelet therapy: To resume ASA   March 7th per Dr. Olevia Bowens for CVA prophylaxis   3. Pain Management: Will d/c IV dilaudid--> continue oxycodone prn             -discussed Kpad to right thigh hip as well.  4. Mood: LCSW to follow for evaluation and support.              -antipsychotic agents: N/A 5. Neuropsych: This patient is capable of making decisions on his own behalf. 6. Skin/Wound Care: Routine pressure relief measures.  7. Fluids/Electrolytes/Nutrition: Strict I/O. Monitor weights daily. Renal diet with 1200 cc/FR             --consult dietician to educate patient on renal diet.  8. ESRD: Has been transitioned to HD--clipped for MWF at [facility].              --schedule HD at the end of the day to help with tolerance of therapy 9. H/o Seizure d/o: Continue Lamictal and  Keppra--monitor for breakthrough seizures.  10. T2DM: Monitor BS ac/hs--has been diet controlled.              --Continue SSI for now 11. HTN: Hgb A1c-5.6 and diet controlled for the past 9 months.               --will monitor BP tid--has been trending upwards.              --Continue Amlodipine, metoprolol and Imdur and titrate medications as indicated.              --Will order orthostatic vitals as continues to have increased nausea with activity.  12. Spontaneous, large right iliopsoas hematoma:              Leucocytosis resolving. Hgb stable at 8.5             -pain control as above 13. 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              -consult with ortho while here regarding considerations. Not a surgical candidate at this point, and he might never be unfortunately.              -pain control and activity modification.  14. OSA: Non-compliant with CPAP. 15. Acute on chronic anemia: Continue to monitor H/H. ON aranesp weekly.              --INR slowing normalizing 16. Neuropathy: acute on chronic now with worsening of symptoms RLE due to compression.          Bary Leriche, PA-C 03/26/2020  I have personally performed a face to face diagnostic evaluation of this patient and formulated the key components  of the plan.  Additionally, I have personally reviewed laboratory data, imaging studies, as well as relevant notes and concur with the physician assistant's documentation above.  The patient's status has not changed from the original H&P.  Any changes in documentation from the acute care chart have been noted above.  Meredith Staggers, MD, Mellody Drown

## 2020-03-27 LAB — GLUCOSE, CAPILLARY
Glucose-Capillary: 134 mg/dL — ABNORMAL HIGH (ref 70–99)
Glucose-Capillary: 99 mg/dL (ref 70–99)
Glucose-Capillary: 120 mg/dL — ABNORMAL HIGH (ref 70–99)
Glucose-Capillary: 93 mg/dL (ref 70–99)

## 2020-03-27 LAB — PARATHYROID HORMONE, INTACT (NO CA): PTH: 51 pg/mL (ref 15–65)

## 2020-03-27 MED ORDER — ONDANSETRON HCL 4 MG PO TABS
4.0000 mg | ORAL_TABLET | Freq: Three times a day (TID) | ORAL | Status: DC | PRN
  Administered 2020-03-27 – 2020-04-04 (×9): 4 mg via ORAL
  Filled 2020-03-27 (×10): qty 1

## 2020-03-27 MED ORDER — MECLIZINE HCL 12.5 MG PO TABS
12.5000 mg | ORAL_TABLET | Freq: Three times a day (TID) | ORAL | Status: DC | PRN
  Administered 2020-03-28: 12.5 mg via ORAL
  Filled 2020-03-27 (×3): qty 1

## 2020-03-27 NOTE — Progress Notes (Signed)
Patient ID: Max Pittman., male   DOB: January 17, 1976, 45 y.o.   MRN: [id number] [facility] Progress Note   Assessment/ Plan:   1.  Acute hypoxic respiratory failure/ cardiac arrest: due to severe volume overload/pulmonary edema causing asystolic cardiac arrest. Resolved w/ CRRT.  2. ESRD: severe vol overload on admission. Had CRRT w/ UF of 31 L. Suspected membrane failure so cannot do PD anymore. Transitioning to HD. IR placed [place] on 2/23. PD cath removed 2/25 per gen surgery. TTS HD here, next HD due 3/3. Is interested in home hemo-  Usually we want a fistula for that - did talk about that briefly but may be too soon right now given what he has been through.  Now may be going to inpatient rehab- will look to get it done toward the end of his stay.  Tentatively for now has OP spot at [facility] on MWF early AM.  Reports nausea with tx-  Will look at UF goal for next time 3. Retroperitoneal hematoma: seen by CT 2/24 large R iliopsoas hematoma 10 x 7 x 20 cm. Anticoagulation on hold.  4. Anemia ckd: Hb in 8- 9 range, sp darbe 100 ug  q thurs going ahead.   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 Nutrition: On renal diet  7. Hypertension: euvolemic, holding wt's high 110 kg's. Continues on norvasc/ metoprolol.    8. 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 H/o CVA/ hx seizures - taking lamictal and keppra 10. Dispo - Per PT needs SNF placement for rehab -  Now on inpatient rehab    [name]  03/27/2020, 10:34 AM    Subjective:   HD yest- removed 2000 - said had some nausea which has been a theme, now on rehab    Objective:   BP 125/80 (BP Location: Right Arm)   Pulse 87   Temp 97.7 F (36.5 C) (Oral)   Resp 14   Ht '5\' 9"'$  (1.753 m)   Wt 119.6 kg   SpO2 93%   BMI 38.94 kg/m   Physical Exam: Gen: Resting comfortably in bed CVS: RRR no mrg   Resp: CTA, no rales or wheezing Abd: Soft, obese, nontender, pd  cath removed Ext: no LE or UE edema  Thrombosed LUA AVF   R IJ [facility]    Labs: BMET Recent Labs  Lab 03/21/20 1203 03/23/20 0733 03/26/20 0648  NA 133* 135 137  K 3.8 4.1 4.2  CL 96* 99 100  CO2 21* 23 24  GLUCOSE 97 94 126*  BUN 58* 36* 43*  CREATININE 9.64* 8.41* 9.53*  CALCIUM 8.7* 8.7* 9.1  PHOS  --   --  5.3*   CBC Recent Labs  Lab 03/21/20 0108 03/22/20 0052 03/23/20 0149 03/24/20 0140 03/25/20 0104 03/26/20 0648  WBC 13.0*   < > 12.7* 11.2* 10.4 10.2  NEUTROABS 8.9*  --   --   --   --   --   HGB 7.7*   < > 8.8* 8.5* 8.7* 8.5*  HCT 24.7*   < > 28.9* 26.5* 29.0* 27.6*  MCV 89.8   < > 92.3 91.7 92.9 92.9  PLT 457*   < > 499* 431* 414* 435*   < > = values in this interval not displayed.     Medications:    . amLODipine  5 mg Oral Daily  . atorvastatin  40 mg Oral  q1800  . buPROPion  150 mg Oral BID WC  . cephALEXin  250 mg Oral Q12H  . Chlorhexidine Gluconate Cloth  6 each Topical Daily  . [START ON 03/28/2020] darbepoetin (ARANESP) injection - DIALYSIS  100 mcg Intravenous Q Thu-HD  . docusate sodium  100 mg Oral BID  . feeding supplement (NEPRO CARB STEADY)  237 mL Oral TID BM  . FLUoxetine  10 mg Oral Daily  . isosorbide mononitrate  15 mg Oral Daily  . lamoTRIgine  100 mg Oral BID  . levETIRAcetam  500 mg Oral Daily  . loratadine  10 mg Oral Daily  . mouth rinse  15 mL Mouth Rinse BID  . melatonin  5 mg Oral QHS  . metoprolol tartrate  12.5 mg Oral BID  . multivitamin  1 tablet Oral QHS  . Muscle Rub   Topical TID WC & HS  . pantoprazole  40 mg Oral Daily  . polyethylene glycol  17 g Oral Daily  . pregabalin  75 mg Oral QHS  . sevelamer carbonate  2,400 mg Oral TID WC

## 2020-03-27 NOTE — Progress Notes (Signed)
Pt refused to wear SCD tonight. RN educated pt. RN will continue to monitor. Pt stable at this time.

## 2020-03-27 NOTE — Evaluation (Signed)
Occupational Therapy Assessment and Plan  Patient Details  Name: Max Pittman. MRN: [id number] Date of Birth: 02/14/1975  OT Diagnosis: acute pain and muscle weakness (generalized) Rehab Potential: Rehab Potential (ACUTE ONLY): Good ELOS: 14-16 days   Today's Date: 03/27/2020 OT Individual Time: 2458-0998 OT Individual Time Calculation (min): 60 min     Hospital Problem: Principal Problem:   Debility   Past Medical History:  Past Medical History:  Diagnosis Date  . Arthritis   . Closed dislocation of left hip ([place]) 05/21/2019  . Diabetes ([place])   . Diabetes mellitus without complication ([place])   . DVT (deep venous thrombosis) ([place])   . History of peritoneal dialysis   . Hypertension   . Renal disorder    FFGS  . Renal disorder   . Sleep apnea   . Stroke [facility])    when ha was a child  . Vasovagal syndrome    with syncope   Past Surgical History:  Past Surgical History:  Procedure Laterality Date  . AMPUTATION Right 07/21/2019   Procedure: Right index finger amputation as necessary at distal interphalangeal joint;  Surgeon: Roseanne Kaufman, MD;  Location: [place];  Service: Orthopedics;  Laterality: Right;  60 mins  . APPLICATION OF WOUND VAC Left 05/22/2019   Procedure: APPLICATION OF WOUND VAC  LEFT HIP;  Surgeon: Altamese Cissna Park, MD;  Location: [place];  Service: Orthopedics;  Laterality: Left;  . APPLICATION OF WOUND VAC Left 07/17/2019   Procedure: WOUND VAC CHANGE;  Surgeon: Altamese Vega, MD;  Location: [place];  Service: Orthopedics;  Laterality: Left;  . AV FISTULA INSERTION W/ RF MAGNETIC GUIDANCE Left   . AV FISTULA PLACEMENT    . BUBBLE STUDY  05/25/2019   Procedure: BUBBLE STUDY;  Surgeon: Dorothy Spark, MD;  Location: [place];  Service: Cardiovascular;;  . CHOLECYSTECTOMY    . HERNIA REPAIR    . HIP CLOSED REDUCTION Left 05/20/2019   Procedure: CLOSED REDUCTION HIP WITH TRACTION PIN APPLICATION;  Surgeon: Altamese Jesup, MD;  Location: [place];  Service:  Orthopedics;  Laterality: Left;  . INCISION AND DRAINAGE HIP Left 07/14/2019   Procedure: IRRIGATION AND DEBRIDEMENT HIP;  Surgeon: Altamese St. Thomas, MD;  Location: [place];  Service: Orthopedics;  Laterality: Left;  . INCISION AND DRAINAGE HIP Left 07/17/2019   Procedure: REPEAT IRRIGATION AND DEBRIDEMENT LEFT HIP;  Surgeon: Altamese , MD;  Location: [place];  Service: Orthopedics;  Laterality: Left;  . IR FLUORO GUIDE CV LINE RIGHT  05/23/2019  . IR FLUORO GUIDE CV LINE RIGHT  07/21/2019  . IR PERC TUN PERIT CATH WO PORT S&I Dartha Lodge  03/20/2020  . IR REMOVAL TUN CV CATH W/O FL  08/29/2019  . IR US GUIDE VASC ACCESS RIGHT  05/23/2019  . IR US GUIDE VASC ACCESS RIGHT  07/21/2019  . IR US GUIDE VASC ACCESS RIGHT  03/20/2020  . LEFT HEART CATH AND CORONARY ANGIOGRAPHY N/A 03/22/2019   Procedure: LEFT HEART CATH AND CORONARY ANGIOGRAPHY;  Surgeon: Wellington Hampshire, MD;  Location: [place] CV LAB;  Service: Cardiovascular;  Laterality: N/A;  . LOOP RECORDER INSERTION N/A 05/25/2019   Procedure: LOOP RECORDER INSERTION;  Surgeon: [name], MD;  Location: [place] CV LAB;  Service: Cardiovascular;  Laterality: N/A;  . MINOR REMOVAL OF PERITONEAL DIALYSIS CATHETER N/A 03/22/2020   Procedure: REMOVAL OF PERITONEAL DIALYSIS CATHETER;  Surgeon: Kieth Brightly, Arta Bruce, MD;  Location: [place];  Service: General;  Laterality: N/A;  ROOM 1 STATING  AT 11:00AM FOR 60 MIN  . ORIF ACETABULAR FRACTURE Left 05/22/2019   Procedure: OPEN REDUCTION INTERNAL FIXATION (ORIF) T TYPE  WITH ASSOCIATED POSTERIOR WALL ACETABULAR FRACTURE, LEFT; REMOVAL OF TRACTION PIN LEFT TIBIA;  Surgeon: Altamese Neponset, MD;  Location: [place];  Service: Orthopedics;  Laterality: Left;  . REMOVAL OF A DIALYSIS CATHETER Right 07/17/2019   Procedure: REMOVAL OF HEMODIALYSIS DIALYSIS CATHETER;  Surgeon: Altamese Raymer, MD;  Location: [place];  Service: Orthopedics;  Laterality: Right;  . TEE WITHOUT CARDIOVERSION N/A 05/25/2019   Procedure:  TRANSESOPHAGEAL ECHOCARDIOGRAM (TEE);  Surgeon: Dorothy Spark, MD;  Location: [facility] ENDOSCOPY;  Service: Cardiovascular;  Laterality: N/A;  . TEE WITHOUT CARDIOVERSION N/A 07/19/2019   Procedure: TRANSESOPHAGEAL ECHOCARDIOGRAM (TEE);  Surgeon: Sueanne Margarita, MD;  Location: [facility] ENDOSCOPY;  Service: Cardiovascular;  Laterality: N/A;  . TONSILLECTOMY      Assessment & Plan Clinical Impression: Patient is a 45 y.o. 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 He required 12 minutes of CPR with ROSC, was intubated in ED and treated with cooling protocol. Per reports, patient was having difficulty with PD prior to admission and IJ placed by PCCM with CRRT for management of fluid overload. Asystolic arrest felt to be due to fluid overload and EEG done showing evidence of diffuse encephalopathy without seizures. He was treated with IV antibiotics due purulent secretions/ concerns of aspiration PNA as well as pressors for BP support. He self extubated on 02/14 and did not tolerate BIPAP but respiratory status stable. Mentation improved and he was started on regular diet.   He had recurrent rise in WBC and was started on Ancef 02/22--BCX 2 were negative. Tunneled IJ catheter placed by Dr. Sharen Heck. Nephrology questioned compliance with PD v/s failure and he has been transitioned to HD. 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 He was transfused with one unit PRBC for acute on chronic anemia with drop in H/H to 7.3/22.9-->ASA and coumadin d/c as he has completed >6 months treatment for DVT. PD catheter removed by Dr. Elias Else.  He is tolerating HD and plans for fistula placement towards end of rehab. Therapy has been ongoing working on pre-gait activity. Patient noted to be debilitated with RLE>LLE pain with weakness and decreased coordination, knee instability and nausea with standing attempts. CIR recommended due to functional decline.   Patient transferred to CIR on 03/26/2020 .    Patient currently requires mod with basic self-care skills secondary to muscle weakness, decreased cardiorespiratoy endurance and decreased standing balance, decreased postural control and decreased balance strategies.  Prior to hospitalization, patient could complete ADLs with modified independent .  Patient will benefit from skilled intervention to decrease level of assist with basic self-care skills prior to discharge home with care partner.  Anticipate patient will require intermittent supervision and follow up home health.  OT - End of Session Activity Tolerance: Tolerates 30+ min activity with multiple rests Endurance Deficit: Yes Endurance Deficit Description: requires frequent rest breaks OT Assessment Rehab Potential (ACUTE ONLY): Good OT Barriers to Discharge: Hemodialysis OT Patient demonstrates impairments in the following area(s): Balance;Endurance;Motor;Pain;Safety;Sensory OT Basic ADL's Functional Problem(s): Grooming;Bathing;Dressing;Toileting OT Transfers  Functional Problem(s): Toilet;Tub/Shower OT Additional Impairment(s): None OT Plan OT Intensity: Minimum of 1-2 x/day, 45 to 90 minutes OT Frequency: 5 out of 7 days OT Duration/Estimated Length of Stay: 14-16 days OT Treatment/Interventions: Medical illustrator training;Community reintegration;Discharge planning;Disease mangement/prevention;DME/adaptive equipment instruction;Functional mobility training;Neuromuscular re-education;Pain management;Patient/family education;Psychosocial support;Self Care/advanced ADL retraining;Skin care/wound managment;Therapeutic  Activities;Therapeutic Exercise;UE/LE Strength taining/ROM;Wheelchair propulsion/positioning OT Basic Self-Care Anticipated Outcome(s): Supervision OT Toileting Anticipated Outcome(s): Supervision OT Bathroom Transfers Anticipated Outcome(s): Supervision-Min assist OT Recommendation Recommendations for Other Services: Neuropsych consult Patient destination: Home Follow Up Recommendations: Home health OT;24 hour supervision/assistance Equipment Recommended: None recommended by OT   OT Evaluation Precautions/Restrictions  Precautions Precautions: Fall Precaution Comments: had fall 2/22 Restrictions Weight Bearing Restrictions: No Pain Pain Assessment Pain Scale: 0-10 Pain Score: 7  Pain Type: Acute pain Pain Location: Leg Pain Orientation: Right Pain Radiating Towards: hip Pain Descriptors / Indicators: Aching;Discomfort Pain Frequency: Constant Pain Onset: Awakened from sleep Patients Stated Pain Goal: 3 Pain Intervention(s): Medication (See eMAR) Multiple Pain Sites: Yes Home Living/Prior Functioning Home Living Family/patient expects to be discharged to:: Private residence Living Arrangements: Spouse/significant other Available Help at Discharge: Available 24 hours/day Type of Home: Mobile home Home Access: Ramped entrance Entrance Stairs-Number of Steps: threshold Home Layout: One level Bathroom Shower/Tub: Tub/shower unit (has tub bench and handheld shower head) Bathroom Toilet: Standard Bathroom Accessibility: Yes Additional Comments: Pt has tub transfer bench and 3 in1  Lives With: Significant other,Other (Comment) (and stepdaughter and granddaughter) IADL History Homemaking Responsibilities: No Current License: No Prior Function Level of Independence: Independent with basic ADLs,Requires assistive device for independence  Able to Take Stairs?: Yes Driving: No Vocation: Unemployed Vision Baseline Vision/History: No visual deficits (reports used to wear  glasses but doesn't need them anymore) Patient Visual Report: No change from baseline Vision Assessment?: No apparent visual deficits Perception  Perception: Within Functional Limits Praxis Praxis: Intact Cognition Overall Cognitive Status: Within Functional Limits for tasks assessed Orientation Level: Person;Place;Situation Person: Oriented Place: Oriented Situation: Oriented Year: 2022 Month: March Day of Week: Incorrect (Tuesday) Memory: Appears intact Immediate Memory Recall: Sock;Blue;Bed Memory Recall Sock: With Cue Memory Recall Blue: Without Cue Memory Recall Bed: Without Cue Attention: Selective Selective Attention: Appears intact Awareness: Appears intact Problem Solving: Appears intact Safety/Judgment: Appears intact Sensation Sensation Light Touch: Impaired Detail Peripheral sensation comments: reports numbness along ulnar distribution and fingertips on L hand, tingling in R upper arm, tingling in RLE Light Touch Impaired Details: Impaired RUE;Impaired RLE;Impaired LUE Proprioception: Appears Intact Coordination Gross Motor Movements are Fluid and Coordinated: No Fine Motor Movements are Fluid and Coordinated: Yes Finger Nose Finger Test: WNL Motor  Motor Motor: Abnormal postural alignment and control Motor - Skilled Clinical Observations: grossly uncoordinated due to fatigue, generalized weakness, decreased balance/postural control, and bilateral knee buckling L>R.  Trunk/Postural Assessment  Cervical Assessment Cervical Assessment: Within Functional Limits Thoracic Assessment Thoracic Assessment: Exceptions to [facility] (mild kyphosis) Lumbar Assessment Lumbar Assessment: Exceptions to [facility] (posterior pelvic tilt) Postural Control Postural Control: Deficits on evaluation  Balance Balance Balance Assessed: Yes Dynamic Sitting Balance Dynamic Sitting - Balance Support: During functional activity Dynamic Sitting - Level of Assistance: 5: Stand by  assistance Dynamic Standing Balance Dynamic Standing - Balance Support: During functional activity Dynamic Standing - Level of Assistance: 4: Min assist Extremity/Trunk Assessment RUE Assessment RUE Assessment: Within Functional Limits Active Range of Motion (AROM) Comments: WNL General Strength Comments: grossly 4+/5 LUE Assessment LUE Assessment: Within Functional Limits Active Range of Motion (AROM) Comments: WNL General Strength Comments: grossly  4+/5  Care Tool Care Tool Self Care Eating        Oral Care         Bathing   Body parts bathed by patient: Right arm;Left arm;Chest;Abdomen;Front perineal area;Buttocks;Right upper leg;Left upper leg;Right lower leg;Left lower leg;Face     Assist Level: Minimal Assistance - Patient > 75%    Upper Body Dressing(including orthotics)   What is the patient wearing?: Hospital gown only   Assist Level: Supervision/Verbal cueing    Lower Body Dressing (excluding footwear)   What is the patient wearing?: Pants Assist for lower body dressing: Minimal Assistance - Patient > 75%    Putting on/Taking off footwear   What is the patient wearing?: Non-skid slipper socks Assist for footwear: Supervision/Verbal cueing       Care Tool Toileting Toileting activity         Care Tool Bed Mobility Roll left and right activity        Sit to lying activity        Lying to sitting edge of bed activity         Care Tool Transfers Sit to stand transfer   Sit to stand assist level: Minimal Assistance - Patient > 75%    Chair/bed transfer   Chair/bed transfer assist level: Minimal Assistance - Patient > 75%     Toilet transfer   Assist Level: Moderate Assistance - Patient 50 - 74%     Care Tool Cognition Expression of Ideas and Wants Expression of Ideas and Wants: Without difficulty (complex and basic) - expresses complex messages without difficulty and with speech that is clear and easy to understand   Understanding Verbal  and Non-Verbal Content Understanding Verbal and Non-Verbal Content: Understands (complex and basic) - clear comprehension without cues or repetitions   Memory/Recall Ability *first 3 days only Memory/Recall Ability *first 3 days only: Current season;Location of own room;Staff names and faces;That he or she is in a hospital/hospital unit    Refer to Care Plan for [place] 1 OT Short Term Goal 1 (Week 1): Pt will complete toilet transfers stand pivot with min assist OT Short Term Goal 2 (Week 1): Pt will complete toileting with min assist at sit > stand level OT Short Term Goal 3 (Week 1): Pt will complete 2 grooming tasks in standing for increased standing tolerance OT Short Term Goal 4 (Week 1): Pt will complete LB dressing at sit > stand level with supervision  Recommendations for other services: Neuropsych   Skilled Therapeutic Intervention OT eval completed with discussion of rehab process, OT purpose, POC, ELOS, and goals.  Completed ADL assessment with focus on bathing and dressing seated at EOB with pt able to utilize figure 4 position without cues for LB bathing and dressing.  Engaged in sit > stand from EOB with min assist, pt able to maintain standing with min assist with RW while alternating UE support to pull pants over hips.  Pt completed stand pivot transfer bed <> recliner with RW and mod assist.  Pt demonstrating B knee buckling when transferring to R but then no buckling when transferring to L.  Pt remained seated EOB with RN present to administer morning meds.  ADL ADL Upper Body Bathing: Supervision/safety;Setup Where Assessed-Upper Body Bathing: Edge of bed Lower Body Bathing: Minimal assistance Where Assessed-Lower Body Bathing: Edge of bed Upper Body Dressing: Setup;Supervision/safety Where Assessed-Upper Body Dressing: Edge of bed Lower Body Dressing: Minimal assistance Where Assessed-Lower  Body Dressing: Edge of bed Toilet Transfer:  Minimal assistance Toilet Transfer Method: Stand pivot Toilet Transfer Equipment: Bedside commode Mobility  Transfers Sit to Stand: Minimal Assistance - Patient > 75% Stand to Sit: Minimal Assistance - Patient > 75%   Discharge Criteria: Patient will be discharged from OT if patient refuses treatment 3 consecutive times without medical reason, if treatment goals not met, if there is a change in medical status, if patient makes no progress towards goals or if patient is discharged from hospital.  The above assessment, treatment plan, treatment alternatives and goals were discussed and mutually agreed upon: by patient  Ellwood Dense [facility] 03/27/2020, 9:07 AM

## 2020-03-27 NOTE — Progress Notes (Signed)
PROGRESS NOTE   Subjective/Complaints: C/o pain- increase in oxycodone helped Has nausea and dizziness when standing. Has compazine. Added meclizine Slept well last night Moving bowels regularly.    Objective:   No results found. Recent Labs    03/25/20 0104 03/26/20 0648  WBC 10.4 10.2  HGB 8.7* 8.5*  HCT 29.0* 27.6*  PLT 414* 435*   Recent Labs    03/26/20 0648  NA 137  K 4.2  CL 100  CO2 24  GLUCOSE 126*  BUN 43*  CREATININE 9.53*  CALCIUM 9.1    Intake/Output Summary (Last 24 hours) at 03/27/2020 1525 Last data filed at 03/27/2020 1220 Gross per 24 hour  Intake 695 ml  Output 77 ml  Net 618 ml        Physical Exam: Vital Signs Blood pressure 105/69, pulse 82, temperature (!) 97.3 F (36.3 C), temperature source Oral, resp. rate 17, height '5\' 9"'$  (1.753 m), weight 119.6 kg, SpO2 96 %. Gen: no distress, normal appearing HEENT: oral mucosa pink and moist, NCAT Cardio: Reg rate Chest: normal effort, normal rate of breathing Abdominal:  General: Bowel sounds are normal. There is nodistension.  Palpations: Abdomen is soft. There is nomass.  Tenderness: There is no abdominal tenderness.  Comments: Wounds from PD cath removal present, clean, intact Musculoskeletal:  General: Swelling(right thigh)and tenderness(right thigh, hip as well as left hip)present.  Cervical back: Normal range of motion.  Skin: General: Skin is warmand dry.  Comments: Left > right shin with pock marked healed lesions. Neurological:  Mental Status: He is alertand oriented to person, place, and time. Mental status isat baseline.  Cranial Nerves: No cranial nerve deficit.  Comments: Motor 4+/5 UE. RLE limited by pain 2/5 prox to 4/5 distally. LLE 3/5 prox to 4/5 distally. Distal LE sensory loss to LT. DTR's 1+ Psychiatric:  Mood and Affect: Moodnormal.  Behavior:  Behaviornormal.  Thought Content: Thought contentnormal.    Assessment/Plan: 1. Functional deficits which require 3+ hours per day of interdisciplinary therapy in a comprehensive inpatient rehab setting.  Physiatrist is providing close team supervision and 24 hour management of active medical problems listed below.  Physiatrist and rehab team continue to assess barriers to discharge/monitor patient progress toward functional and medical goals  Care Tool:  Bathing    Body parts bathed by patient: Right arm,Left arm,Chest,Abdomen,Front perineal area,Buttocks,Right upper leg,Left upper leg,Right lower leg,Left lower leg,Face         Bathing assist Assist Level: Minimal Assistance - Patient > 75%     Upper Body Dressing/Undressing Upper body dressing   What is the patient wearing?: Hospital gown only    Upper body assist Assist Level: Supervision/Verbal cueing    Lower Body Dressing/Undressing Lower body dressing      What is the patient wearing?: Pants     Lower body assist Assist for lower body dressing: Minimal Assistance - Patient > 75%     Toileting Toileting    Toileting assist Assist for toileting: Maximal Assistance - Patient 25 - 49%     Transfers Chair/bed transfer  Transfers assist     Chair/bed transfer assist level: Moderate Assistance - Patient 50 - 74%  Locomotion Ambulation   Ambulation assist      Assist level: 2 helpers Assistive device: Walker-rolling Max distance: 63f   Walk 10 feet activity   Assist  Walk 10 feet activity did not occur: Safety/medical concerns (bilateral knee buckling, weakness, fatigue, decreased balance)        Walk 50 feet activity   Assist Walk 50 feet with 2 turns activity did not occur: Safety/medical concerns (bilateral knee buckling, weakness, fatigue, decreased balance)         Walk 150 feet activity   Assist Walk 150 feet activity did not occur: Safety/medical concerns  (bilateral knee buckling, weakness, fatigue, decreased balance)         Walk 10 feet on uneven surface  activity   Assist Walk 10 feet on uneven surfaces activity did not occur: Safety/medical concerns (bilateral knee buckling, weakness, fatigue, decreased balance)         Wheelchair     Assist Will patient use wheelchair at discharge?: Yes Type of Wheelchair: Manual    Wheelchair assist level: Supervision/Verbal cueing Max wheelchair distance: >1531f   Wheelchair 50 feet with 2 turns activity    Assist        Assist Level: Supervision/Verbal cueing   Wheelchair 150 feet activity     Assist      Assist Level: Supervision/Verbal cueing   Blood pressure 105/69, pulse 82, temperature (!) 97.3 F (36.3 C), temperature source Oral, resp. rate 17, height '5\' 9"'$  (1.753 m), weight 119.6 kg, SpO2 96 %.  Medical Problem List and Plan: 1.Functional and mobility deficitssecondary to debility after cardiac arrest, volume overload multiple medical. -patient may shower -ELOS/Goals: 18-20 days, supervision to min assist with self-care and w/c level mobility  -Initial CIR evals today.  2. Antithrombotics: -DVT/anticoagulation:Mechanical:Sequential compression devices, below kneeBilateral lower extremities -antiplatelet therapy: To resume ASA March 7thper Dr. OrOlevia Bowensor CVA prophylaxis 3. Pain Management:Will d/c IV dilaudid--> continue oxycodone prn, doing well with current regimen.  -discussed Kpad to right thigh hip as well. 4. Mood:LCSW to follow for evaluation and support. -antipsychotic agents: N/A 5. Neuropsych: This patientiscapable of making decisions on hisown behalf. 6. Skin/Wound Care:Routine pressure relief measures. 7. Fluids/Electrolytes/Nutrition:Strict I/O. Monitor weights daily. Renal diet with 1200 cc/FR --consult dietician to educate patient on renal  diet.  8. ESRD: Has been transitioned to HD[facility] --schedule HD at the end of the day to help with tolerance of therapy 9. H/o Seizure d/o: Continue Lamictal and Keppra--monitor for breakthrough seizures.  10. T2DM: Monitor BS ac/hs--has been diet controlled. --Continue SSI for now 11. HTN:Hgb A1c-5.6 and diet controlled for the past 9 months.  --will monitor BP tid--has been trending upwards.  --Continue Amlodipine, metoprolol and Imdur and titrate medications as indicated.  --Will order orthostatic vitals as continues to have increased nausea with activity.  12. Spontaneous, large right iliopsoashematoma:  Leucocytosis resolving. Hgb stable at 8.5 -pain control as above 13. 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  -consult with ortho while here regarding considerations. Not a surgical candidate at this point, and he might never be unfortunately.  -pain control and activity modification.  14. OSA: Non-compliant with CPAP. 15. Acute on chronic anemia: Continue to monitor H/H. ON aranesp weekly.  --INR slowing normalizing 16.Neuropathy: acute on chronic now with worsening of symptoms RLE due to compression. 17. Dizziness: meclizine and vestibular eval  ordered.     LOS: 1 days A FACE TO FACE EVALUATION WAS PERFORMED  Max Pittman 03/27/2020, 3:25 PM

## 2020-03-27 NOTE — Evaluation (Signed)
Physical Therapy Assessment and Plan  Patient Details  Name: Max Pittman. MRN: [id number] Date of Birth: 1976-01-08  PT Diagnosis: Abnormal posture, Abnormality of gait, Difficulty walking, Impaired sensation, Muscle weakness and Pain in bilateral LEs Rehab Potential: Good ELOS: 14-16 days   Today's Date: 03/27/2020 PT Individual Time: 9622-2979 PT Individual Time Calculation (min): 54 min    Hospital Problem: Principal Problem:   Debility   Past Medical History:  Past Medical History:  Diagnosis Date  . Arthritis   . Closed dislocation of left hip ([place]) 05/21/2019  . Diabetes ([place])   . Diabetes mellitus without complication ([place])   . DVT (deep venous thrombosis) ([place])   . History of peritoneal dialysis   . Hypertension   . Renal disorder    FFGS  . Renal disorder   . Sleep apnea   . Stroke [facility])    when ha was a child  . Vasovagal syndrome    with syncope   Past Surgical History:  Past Surgical History:  Procedure Laterality Date  . AMPUTATION Right 07/21/2019   Procedure: Right index finger amputation as necessary at distal interphalangeal joint;  Surgeon: Roseanne Kaufman, MD;  Location: [place];  Service: Orthopedics;  Laterality: Right;  60 mins  . APPLICATION OF WOUND VAC Left 05/22/2019   Procedure: APPLICATION OF WOUND VAC  LEFT HIP;  Surgeon: Altamese Mystic, MD;  Location: [place];  Service: Orthopedics;  Laterality: Left;  . APPLICATION OF WOUND VAC Left 07/17/2019   Procedure: WOUND VAC CHANGE;  Surgeon: Altamese [place], MD;  Location: [place];  Service: Orthopedics;  Laterality: Left;  . AV FISTULA INSERTION W/ RF MAGNETIC GUIDANCE Left   . AV FISTULA PLACEMENT    . BUBBLE STUDY  05/25/2019   Procedure: BUBBLE STUDY;  Surgeon: Dorothy Spark, MD;  Location: [place];  Service: Cardiovascular;;  . CHOLECYSTECTOMY    . HERNIA REPAIR    . HIP CLOSED REDUCTION Left 05/20/2019   Procedure: CLOSED REDUCTION HIP WITH TRACTION PIN APPLICATION;  Surgeon: Altamese Cottonwood, MD;  Location: [place];  Service: Orthopedics;  Laterality: Left;  . INCISION AND DRAINAGE HIP Left 07/14/2019   Procedure: IRRIGATION AND DEBRIDEMENT HIP;  Surgeon: Altamese Leake, MD;  Location: [place];  Service: Orthopedics;  Laterality: Left;  . INCISION AND DRAINAGE HIP Left 07/17/2019   Procedure: REPEAT IRRIGATION AND DEBRIDEMENT LEFT HIP;  Surgeon: Altamese , MD;  Location: [place];  Service: Orthopedics;  Laterality: Left;  . IR FLUORO GUIDE CV LINE RIGHT  05/23/2019  . IR FLUORO GUIDE CV LINE RIGHT  07/21/2019  . IR PERC TUN PERIT CATH WO PORT S&I [place]  03/20/2020  . IR REMOVAL TUN CV CATH W/O FL  08/29/2019  . IR US GUIDE VASC ACCESS RIGHT  05/23/2019  . IR US GUIDE VASC ACCESS RIGHT  07/21/2019  . IR US GUIDE VASC ACCESS RIGHT  03/20/2020  . LEFT HEART CATH AND CORONARY ANGIOGRAPHY N/A 03/22/2019   Procedure: LEFT HEART CATH AND CORONARY ANGIOGRAPHY;  Surgeon: Wellington Hampshire, MD;  Location: [place] CV [facility];  Service: Cardiovascular;  Laterality: N/A;  . LOOP RECORDER INSERTION N/A 05/25/2019   Procedure: LOOP RECORDER INSERTION;  Surgeon: [name], MD;  Location: [place] [facility];  Service: Cardiovascular;  Laterality: N/A;  . MINOR REMOVAL OF PERITONEAL DIALYSIS CATHETER N/A 03/22/2020   Procedure: REMOVAL OF PERITONEAL DIALYSIS CATHETER;  Surgeon: Kieth Brightly, Arta Bruce, MD;  Location: [place];  Service: General;  Laterality:  N/A;  ROOM 1 STATING AT 11:00AM FOR 60 MIN  . ORIF ACETABULAR FRACTURE Left 05/22/2019   Procedure: OPEN REDUCTION INTERNAL FIXATION (ORIF) T TYPE  WITH ASSOCIATED POSTERIOR WALL ACETABULAR FRACTURE, LEFT; REMOVAL OF TRACTION PIN LEFT TIBIA;  Surgeon: Altamese Supreme, MD;  Location: [place];  Service: Orthopedics;  Laterality: Left;  . REMOVAL OF A DIALYSIS CATHETER Right 07/17/2019   Procedure: REMOVAL OF HEMODIALYSIS DIALYSIS CATHETER;  Surgeon: Altamese Gordo, MD;  Location: [place];  Service: Orthopedics;  Laterality: Right;  . TEE WITHOUT CARDIOVERSION  N/A 05/25/2019   Procedure: TRANSESOPHAGEAL ECHOCARDIOGRAM (TEE);  Surgeon: Dorothy Spark, MD;  Location: [facility] ENDOSCOPY;  Service: Cardiovascular;  Laterality: N/A;  . TEE WITHOUT CARDIOVERSION N/A 07/19/2019   Procedure: TRANSESOPHAGEAL ECHOCARDIOGRAM (TEE);  Surgeon: Sueanne Margarita, MD;  Location: [facility] ENDOSCOPY;  Service: Cardiovascular;  Laterality: N/A;  . TONSILLECTOMY      Assessment & Plan Clinical Impression: Patient is a 45 y.o. year old male with PMH of ESRD (on PD at baseline), DM 2, HTN, DVT (on coumadin), and OSA who was admitted to [facility] on 03/07/20 for worsening SOB.  On arrival to ED pt had cardiac arrest with CPR for 12 minutes before ROSC.  Cardiac arrest felt to be related to severe volume overload and pulmonary edema causing asystole.  He was intubated and admitted to the ICU and self extubated on 2/14.  Pt required CRRT in the ICU with removal of 31L of fluid, and nephrology suspected membrane failure so pt no longer a candidate for PD.  Transition back to HD with TDC placed on 2/23 by IR.  Pt on TRS schedule while admitted at home.  Hospital course complicated by retroperitoneal hematoma seen on CT 2/24 in area of R iliopsoas so anticoagulation on hold.  ABLA monitoring Hb, which is stable in 8-9 range.  Had initially felt he would improve enough to return home, but continues to require min assist for limited mobility and max to total assist for LB ADLs.  Therapy recommending CIR and pt agreeable.   Patient currently requires mod with mobility secondary to muscle weakness, decreased cardiorespiratoy endurance and decreased standing balance, decreased postural control and decreased balance strategies.  Prior to hospitalization, patient was modified independent  with mobility and lived with Significant other,Other (Comment) (and stepdaughter and granddaughter) in a Mobile home home.  Home access is threshold with Ramped entrance.  Patient will benefit from skilled PT  intervention to maximize safe functional mobility, minimize fall risk and decrease caregiver burden for planned discharge home with 24 hour supervision.  Anticipate patient will benefit from follow up [place] at discharge.  PT - End of Session Activity Tolerance: Tolerates 30+ min activity with multiple rests Endurance Deficit: Yes Endurance Deficit Description: requires frequent rest breaks PT Assessment Rehab Potential (ACUTE/IP ONLY): Good PT Barriers to Discharge: [place] home environment;Home environment access/layout;Hemodialysis PT Barriers to Discharge Comments: 1 threshold to enter, bilateral knee buckling PT Patient demonstrates impairments in the following area(s): Balance;Edema;Endurance;Motor;Pain;Sensory PT Transfers Functional Problem(s): Bed Mobility;Bed to Chair;Car;Furniture PT Locomotion Functional Problem(s): Ambulation;Wheelchair Mobility;Stairs PT Plan PT Intensity: Minimum of 1-2 x/day ,45 to 90 minutes PT Frequency: 5 out of 7 days PT Duration Estimated Length of Stay: 14-16 days PT Treatment/Interventions: Ambulation/gait training;Discharge planning;Functional mobility training;Psychosocial support;Therapeutic Activities;Balance/vestibular training;Disease management/prevention;Neuromuscular re-education;Skin care/wound management;Therapeutic Exercise;Wheelchair propulsion/positioning;Cognitive remediation/compensation;DME/adaptive equipment instruction;Pain management;Splinting/orthotics;UE/LE Strength taining/ROM;Community reintegration;Functional electrical stimulation;Patient/family education;Stair training;UE/LE Coordination activities PT Transfers Anticipated Outcome(s): supervision with LRAD PT Locomotion Anticipated Outcome(s): CGA with LRAD PT  Recommendation Follow Up Recommendations: Home health PT Patient destination: Home Equipment Recommended: To be determined Equipment Details: has RW and WC  PT  Evaluation Precautions/Restrictions Precautions Precautions: Fall Precaution Comments: had fall 2/22 Restrictions Weight Bearing Restrictions: No Home Living/Prior Functioning Home Living Living Arrangements: Spouse/significant other;Children (Lives with daughter and girlfriend) Available Help at Discharge: Available 24 hours/day Type of Home: Mobile home Home Access: Ramped entrance Entrance Stairs-Number of Steps: threshold Home Layout: One level Bathroom Shower/Tub: Chiropodist: Standard Bathroom Accessibility: Yes  Lives With: Significant other;Other (Comment) (and stepdaughter and granddaughter) Prior Function Level of Independence: Requires assistive device for independence;Needs assistance with ADLs  Able to Take Stairs?: No Driving: No Vocation: Unemployed Cognition Overall Cognitive Status: Within Functional Limits for tasks assessed Arousal/Alertness: Awake/alert Orientation Level: Oriented X4 Memory: Appears intact Awareness: Appears intact Problem Solving: Appears intact Safety/Judgment: Appears intact Sensation Sensation Light Touch: Impaired Detail Proprioception: Appears Intact Additional Comments: absent light touch along R L2-3 but able to feel deep pressure. Coordination Gross Motor Movements are Fluid and Coordinated: No Fine Motor Movements are Fluid and Coordinated: Yes Coordination and Movement Description: grossly uncoordinated due to bilateral knee buckling L>R, fatigue, generalized weakness, and decreased balance/postural control. Finger Nose Finger Test: WNL Heel Shin Test: WFL bilaterally but slow Motor  Motor Motor: Abnormal postural alignment and control Motor - Skilled Clinical Observations: grossly uncoordinated due to fatigue, generalized weakness, decreased balance/postural control, and bilateral knee buckling L>R.  Trunk/Postural Assessment  Cervical Assessment Cervical Assessment: Within Functional  Limits Thoracic Assessment Thoracic Assessment: Exceptions to [facility] (mild kyphosis) Lumbar Assessment Lumbar Assessment: Exceptions to [facility] (posterior pelvic tilt) Postural Control Postural Control: Deficits on evaluation  Balance Balance Balance Assessed: Yes Static Sitting Balance Static Sitting - Balance Support: Feet supported;Bilateral upper extremity supported Static Sitting - Level of Assistance: 5: Stand by assistance (supervision) Dynamic Sitting Balance Dynamic Sitting - Balance Support: Feet supported;No upper extremity supported Dynamic Sitting - Level of Assistance: 5: Stand by assistance (supervision) Static Standing Balance Static Standing - Balance Support: Bilateral upper extremity supported (RW) Static Standing - Level of Assistance: 4: Min assist Dynamic Standing Balance Dynamic Standing - Balance Support: Bilateral upper extremity supported (RW) Dynamic Standing - Level of Assistance: 3: Mod assist Dynamic Standing - Comments: with gait and transfers Extremity Assessment  RLE Assessment RLE Assessment: Exceptions to [facility] RLE Strength Right Hip Flexion: 2/5 Right Hip ABduction: 3/5 Right Hip ADduction: 3+/5 Right Knee Flexion: 3+/5 Right Knee Extension: 3-/5 Right Ankle Dorsiflexion: 3+/5 Right Ankle Plantar Flexion: 3+/5 LLE Assessment LLE Assessment: Exceptions to [facility] LLE Strength Left Hip Flexion: 2/5 Left Hip ABduction: 3+/5 Left Hip ADduction: 3/5 Left Knee Flexion: 3+/5 Left Knee Extension: 3+/5 Left Ankle Dorsiflexion: 3+/5 Left Ankle Plantar Flexion: 3+/5  Care Tool Care Tool Bed Mobility Roll left and right activity   Roll left and right assist level: Supervision/Verbal cueing    Sit to lying activity   Sit to lying assist level: Supervision/Verbal cueing    Lying to sitting edge of bed activity   Lying to sitting edge of bed assist level: Supervision/Verbal cueing     Care Tool Transfers Sit to stand transfer   Sit to stand assist  level: Moderate Assistance - Patient 50 - 74%    Chair/bed transfer   Chair/bed transfer assist level: Moderate Assistance - Patient 50 - 74%     Toilet transfer   Assist Level: Moderate Assistance - Patient 50 - 74%    Scientist, product/process development  transfer activity did not occur: Environmental limitations        Care Tool Locomotion Ambulation   Assist level: 2 helpers Assistive device: Walker-rolling Max distance: 77f  Walk 10 feet activity Walk 10 feet activity did not occur: Safety/medical concerns (bilateral knee buckling, weakness, fatigue, decreased balance)       Walk 50 feet with 2 turns activity Walk 50 feet with 2 turns activity did not occur: Safety/medical concerns (bilateral knee buckling, weakness, fatigue, decreased balance)      Walk 150 feet activity Walk 150 feet activity did not occur: Safety/medical concerns (bilateral knee buckling, weakness, fatigue, decreased balance)      Walk 10 feet on uneven surfaces activity Walk 10 feet on uneven surfaces activity did not occur: Safety/medical concerns (bilateral knee buckling, weakness, fatigue, decreased balance)      Stairs Stair activity did not occur: Safety/medical concerns (bilateral knee buckling, weakness, fatigue, decreased balance)        Walk up/down 1 step activity Walk up/down 1 step or curb (drop down) activity did not occur: Safety/medical concerns (bilateral knee buckling, weakness, fatigue, decreased balance)     Walk up/down 4 steps activity did not occuR: Safety/medical concerns (bilateral knee buckling, weakness, fatigue, decreased balance)  Walk up/down 4 steps activity      Walk up/down 12 steps activity Walk up/down 12 steps activity did not occur: Safety/medical concerns (bilateral knee buckling, weakness, fatigue, decreased balance)      Pick up small objects from floor Pick up small object from the floor (from standing position) activity did not occur: Safety/medical concerns (bilateral knee  buckling, weakness, decreased balance)      Wheelchair Will patient use wheelchair at discharge?: Yes Type of Wheelchair: Manual   Wheelchair assist level: Supervision/Verbal cueing Max wheelchair distance: >1550f Wheel 50 feet with 2 turns activity   Assist Level: Supervision/Verbal cueing  Wheel 150 feet activity   Assist Level: Supervision/Verbal cueing    Refer to Care Plan for Long Term Goals  SHORT TERM GOAL WEEK 1 PT Short Term Goal 1 (Week 1): pt will transfer sit<>stand with LRAD and CGA PT Short Term Goal 2 (Week 1): pt will transfer bed<>chair with LRAD and CGA PT Short Term Goal 3 (Week 1): pt will ambulate 1091fith LRAD and min A of 1  Recommendations for other services: None   Skilled Therapeutic Intervention Evaluation completed (see details above and below) with education on PT POC and goals and individual treatment initiated with focus on functional mobility/transfers, generalized strengthening, dynamic standing balance/coordination, ambulation, and improved activity tolerance. Received pt supine in bed, pt educated on PT evaluation, CIR policies, and therapy schedule and agreeable. Pt reported pain 4-5/10 pain in BLEs (premedicated). Repositioning and rest breaks done to reduce pain levels. Nephrology MD present twice during session. Pt transferred supine<>sitting EOB with HOB elevated and supervision and transferred sit<>stand with RW and mod A. Pt able to perform x10 alternating marches with min A for balance and heavy reliance on UE support on RW. Pt able to take 2 steps and stand<>pivot bed<>recliner with RW and mod A overall. Therapist located 20x18 manual WC and foam cushion for pt and pt transferred sit<>stand with RW from recliner with min A and ambulated [id number] RW and mod A+2 for close WC follow. Pt with decreased bilateral hip flexion and heavy reliance on UE support. Pt with mild L knee buckling on last step when ambulating; returned to sitting for safety. Pt  performed WC mobility >  153f using BUE and supervision. Dependent [facility] and pt transferred WC<>bed stand<>pivot with mod A. Pt with sudden L knee buckling resulting in posterior LOB onto bed. Informed NT to use Stedy for transfers due to sudden onset of buckling. Pt transferred sit<>supine with supervision using BUEs to lift LEs into bed. Concluded session with pt semi-reclined in bed, needs within reach, and bed alarm on. Pt reported nausea at end of session and RN notified to administer anti-nausea medication. Safety plan updated.   Mobility Bed Mobility Bed Mobility: Rolling Right;Rolling Left;Sit to Supine;Supine to Sit Rolling Right: Supervision/verbal cueing Rolling Left: Supervision/Verbal cueing Supine to Sit: Supervision/Verbal cueing Sit to Supine: Supervision/Verbal cueing Transfers Transfers: Sit to Stand;Stand to Sit;Stand Pivot Transfers Sit to Stand: Moderate Assistance - Patient 50-74% Stand to Sit: Minimal Assistance - Patient > 75% Stand Pivot Transfers: Moderate Assistance - Patient 50 - 74% Stand Pivot Transfer Details: Verbal cues for precautions/safety;Verbal cues for technique Stand Pivot Transfer Details (indicate cue type and reason): verbal cues for RW management when turning Transfer (Assistive device): Rolling walker Locomotion  Gait Ambulation: Yes Gait Assistance: 2 Helpers Gait Distance (Feet): 8 Feet Assistive device: Rolling walker Gait Assistance Details: Verbal cues for precautions/safety;Verbal cues for safe use of DME/AE Gait Assistance Details: verbal cues for RW management and energy conservation Gait Gait: Yes Gait Pattern: Impaired Gait Pattern: Step-to pattern;Decreased trunk rotation;Decreased stride length;Decreased step length - right;Decreased hip/knee flexion - right;Decreased hip/knee flexion - left;Decreased step length - left;Trunk flexed;Poor foot clearance - left;Poor foot clearance - right;Narrow base of  support Gait velocity: decreased Wheelchair Mobility Wheelchair Mobility: Yes Wheelchair Assistance: SChartered loss adjuster Both upper extremities Wheelchair Parts Management: Needs assistance Distance: >156f Discharge Criteria: Patient will be discharged from PT if patient refuses treatment 3 consecutive times without medical reason, if treatment goals not met, if there is a change in medical status, if patient makes no progress towards goals or if patient is discharged from hospital.  The above assessment, treatment plan, treatment alternatives and goals were discussed and mutually agreed upon: by patient  AnAlfonse AlpersT, DPT  03/27/2020, 12:18 PM

## 2020-03-27 NOTE — Progress Notes (Signed)
Inpatient Rehabilitation Care Coordinator Assessment and Plan Patient Details  Name: Max Pittman. MRN: [id number] Date of Birth: 09-11-1975  Today's Date: 03/27/2020  Hospital Problems: Principal Problem:   Debility  Past Medical History:  Past Medical History:  Diagnosis Date  . Arthritis   . Closed dislocation of left hip ([place]) 05/21/2019  . Diabetes ([place])   . Diabetes mellitus without complication ([place])   . DVT (deep venous thrombosis) ([place])   . History of peritoneal dialysis   . Hypertension   . Renal disorder    FFGS  . Renal disorder   . Sleep apnea   . Stroke [facility])    when ha was a child  . Vasovagal syndrome    with syncope   Past Surgical History:  Past Surgical History:  Procedure Laterality Date  . AMPUTATION Right 07/21/2019   Procedure: Right index finger amputation as necessary at distal interphalangeal joint;  Surgeon: Roseanne Kaufman, MD;  Location: [place];  Service: Orthopedics;  Laterality: Right;  60 mins  . APPLICATION OF WOUND VAC Left 05/22/2019   Procedure: APPLICATION OF WOUND VAC  LEFT HIP;  Surgeon: Altamese Methow, MD;  Location: [place];  Service: Orthopedics;  Laterality: Left;  . APPLICATION OF WOUND VAC Left 07/17/2019   Procedure: WOUND VAC CHANGE;  Surgeon: Altamese [place], MD;  Location: [place];  Service: Orthopedics;  Laterality: Left;  . AV FISTULA INSERTION W/ RF MAGNETIC GUIDANCE Left   . AV FISTULA PLACEMENT    . BUBBLE STUDY  05/25/2019   Procedure: BUBBLE STUDY;  Surgeon: Dorothy Spark, MD;  Location: [place];  Service: Cardiovascular;;  . CHOLECYSTECTOMY    . HERNIA REPAIR    . HIP CLOSED REDUCTION Left 05/20/2019   Procedure: CLOSED REDUCTION HIP WITH TRACTION PIN APPLICATION;  Surgeon: Altamese [place], MD;  Location: [place];  Service: Orthopedics;  Laterality: Left;  . INCISION AND DRAINAGE HIP Left 07/14/2019   Procedure: IRRIGATION AND DEBRIDEMENT HIP;  Surgeon: Altamese [place], MD;  Location: [place];  Service: Orthopedics;   Laterality: Left;  . INCISION AND DRAINAGE HIP Left 07/17/2019   Procedure: REPEAT IRRIGATION AND DEBRIDEMENT LEFT HIP;  Surgeon: Altamese , MD;  Location: [place];  Service: Orthopedics;  Laterality: Left;  . IR FLUORO GUIDE CV LINE RIGHT  05/23/2019  . IR FLUORO GUIDE CV LINE RIGHT  07/21/2019  . IR PERC TUN PERIT CATH WO PORT S&I D[place]a Lodge  03/20/2020  . IR REMOVAL TUN CV CATH W/O FL  08/29/2019  . IR US GUIDE VASC ACCESS RIGHT  05/23/2019  . IR US GUIDE VASC ACCESS RIGHT  07/21/2019  . IR US GUIDE VASC ACCESS RIGHT  03/20/2020  . LEFT HEART CATH AND CORONARY ANGIOGRAPHY N/A 03/22/2019   Procedure: LEFT HEART CATH AND CORONARY ANGIOGRAPHY;  Surgeon: Wellington Hampshire, MD;  Location: [place] CV LAB;  Service: Cardiovascular;  Laterality: N/A;  . LOOP RECORDER INSERTION N/A 05/25/2019   Procedure: LOOP RECORDER INSERTION;  Surgeon: [name], MD;  Location: [place] [facility];  Service: Cardiovascular;  Laterality: N/A;  . MINOR REMOVAL OF PERITONEAL DIALYSIS CATHETER N/A 03/22/2020   Procedure: REMOVAL OF PERITONEAL DIALYSIS CATHETER;  Surgeon: Kieth Brightly, Arta Bruce, MD;  Location: [place];  Service: General;  Laterality: N/A;  ROOM 1 STATING AT 11:00AM FOR 60 MIN  . ORIF ACETABULAR FRACTURE Left 05/22/2019   Procedure: OPEN REDUCTION INTERNAL FIXATION (ORIF) T TYPE  WITH ASSOCIATED POSTERIOR WALL ACETABULAR FRACTURE, LEFT; REMOVAL OF TRACTION PIN  LEFT TIBIA;  Surgeon: Altamese [place], MD;  Location: [place];  Service: Orthopedics;  Laterality: Left;  . REMOVAL OF A DIALYSIS CATHETER Right 07/17/2019   Procedure: REMOVAL OF HEMODIALYSIS DIALYSIS CATHETER;  Surgeon: Altamese [place], MD;  Location: [place];  Service: Orthopedics;  Laterality: Right;  . TEE WITHOUT CARDIOVERSION N/A 05/25/2019   Procedure: TRANSESOPHAGEAL ECHOCARDIOGRAM (TEE);  Surgeon: Dorothy Spark, MD;  Location: [facility] END[facility];  Service: Cardiovascular;  Laterality: N/A;  . TEE WITHOUT CARDIOVERSION N/A 07/19/2019   Procedure:  TRANSESOPHAGEAL ECHOCARDIOGRAM (TEE);  Surgeon: Sueanne Margarita, MD;  Location: [facility] ENDOSCOPY;  Service: Cardiovascular;  Laterality: N/A;  . TONSILLECTOMY     Social History:  reports that he quit smoking about 16 months ago. He has never used smokeless tobacco. He reports current alcohol use. He reports that he does not use drugs.  Family / Support Systems Marital Status: Single Spouse/Significant Other: Norton Pastel Anticipated Caregiver: Norton Pastel Ability/Limitations of Caregiver: Min A Caregiver Availability: 24/7  Social History Preferred language: English Religion: Christian Read: Yes Write: Yes Employment Status: Unemployed Public relations account executive Issues: N/a Guardian/Conservator: Norton Pastel   Abuse/Neglect Abuse/Neglect Assessment Can Be Completed: Yes Physical Abuse: Denies Verbal Abuse: Denies Sexual Abuse: Denies Exploitation of patient/patient's resources: Denies Self-Neglect: Denies  Emotional Status Pt's affect, behavior and adjustment status: no Recent Psychosocial Issues: no Psychiatric History: no Substance Abuse History: no  Patient / Family Perceptions, Expectations & Goals Pt/Family understanding of illness & functional limitations: yes Premorbid pt/family roles/activities: Previously limited in the comm, Independent with cognition. Some assistance with self care Anticipated changes in roles/activities/participation: Some assistance Pt/family expectations/goals: Supervision to [place]: None Premorbid Home Care/DME Agencies: Other (Comment) ([facility], [facility], [facility]) Transportation available at discharge: Family able to transport  Discharge Planning Living Arrangements: Spouse/significant other,Children (Lives with daughter and girlfriend) Support Systems: Spouse/significant other,Children Type of Residence: Private residence (1 level mobile home, ramp entrance/ 3 steps to enter?) Insurance Resources:  Chartered certified accountant Resources: Research scientist (physical sciences) Screen Referred: No Living Expenses: Lives with family Money Management: Patient Does the patient have any problems obtaining your medications?: No Home Management: Administrator, Civil Service Preliminary Plans: Some assistance Care Coordinator Barriers to Discharge: Inaccessible home environment,Hemodialysis Care Coordinator Barriers to Discharge Comments: Patient unsire if [facility] can fit in [place] Anticipated Follow Up Needs: [place] Additional Notes/Comments: OP HD Expected length of stay: 14-18 Days  Clinical Impression SW met with patient, introduced self and explained role. Patient S.O at bedside. Patient would like to pursue medical POA, SW will provide information and chaplain information to patient. Patient plans to discharge home with significant other and adult daughter to assist. Patient lives in a 1 level home. Will continue to follow up with questions and concerns.   Dyanne Iha 03/27/2020, 1:48 PM

## 2020-03-27 NOTE — Progress Notes (Signed)
Subjective: No complaints, said tolerated dialysis yesterday, currently physical therapy working with patient in room, now on CIR on [facility] floor  Objective Vital signs in last 24 hours: Vitals:   03/26/20 1500 03/26/20 1945 03/27/20 0426 03/27/20 0500  BP: (!) 138/96 (!) 131/92 125/80   Pulse: 92 93 87   Resp: '18 16 14   '$ Temp: 98.6 F (37 C) 99.3 F (37.4 C) 97.7 F (36.5 C)   TempSrc: Oral Oral Oral   SpO2: 97% 100% 93%   Weight: 119.1 kg   119.6 kg  Height: '5\' 9"'$  (1.753 m)      Weight change:   Physical Exam: General: Alert male NAD in bed Heart: RRR no MRG Lungs: CTA, nonlabored breathing Abdomen: Soft obese, NT ND PD cath site status post removal healed Extremities: No pedal edema Dialysis Access:  Right IJ TDC, LUA aVF thrombosed   Problem/Plan: 1.  Acute hypoxic respiratory failure/ cardiac arrest: due to severe volume overload/pulmonary edema causing asystolic cardiac arrest. Resolved w/ CRRT. Yesterday on hemodialysis tolerated 2.0 L UF 2. ESRD: severe vol overload on admission. Had CRRT w/ UF of 31 L. Suspected membrane failure so cannot do PD anymore. Transitioning to HD. IR placed [place] on 2/23. PD cath removed 2/25 per gen surgery. TTS HD here, next HD due 3/3. Is interested in home hemo-  Usually we want a fistula for that - did talk about that briefly but may be too soon right now given what he has been through.  will look to get it done toward the end of his stay 3. Retroperitoneal hematoma: seen by CT 2/24 large R iliopsoas hematoma 10 x 7 x 20 cm. Anticoagulation on hold.  4. Anemia ckd: Hb in 8- 9 range, Hgb= 8.5=(03/01) sp darbe 100 ug  q thurs going ahead.   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 Nutrition: On renal diet  7. Hypertension: no vol ^ on exam, holding wt's high 110 kg's. Continues on norvasc/ metoprolol.   Follow-up BP with UF on HD may be able to taper off Norvasc 8. H/o VTE -  a/c on hold d/t #3 9. H/o CVA/ hx seizures - taking lamictal and keppra 10. Dispo -now in  CIR on fifth floor doing rehab   Ernest Haber, [place] [phone] 03/27/2020,10:27 AM  LOS: 1 day   Labs: Basic Metabolic Panel: Recent Labs  Lab 03/21/20 1203 03/23/20 0733 03/26/20 0648  NA 133* 135 137  K 3.8 4.1 4.2  CL 96* 99 100  CO2 21* 23 24  GLUCOSE 97 94 126*  BUN 58* 36* 43*  CREATININE 9.64* 8.41* 9.53*  CALCIUM 8.7* 8.7* 9.1  PHOS  --   --  5.3*   Liver Function Tests: Recent Labs  Lab 03/23/20 0733 03/26/20 0648  AST 19  --   ALT 5  --   ALKPHOS 73  --   BILITOT 0.6  --   PROT 6.2*  --   ALBUMIN 1.9* 2.0*   No results for input(s): LIPASE, AMYLASE in the last 168 hours. No results for input(s): AMMONIA in the last 168 hours. CBC: Recent Labs  Lab 03/21/20 0108 03/22/20 0052 03/23/20 0149 03/24/20 0140 03/25/20 0104 03/26/20 0648  WBC 13.0* 10.9* 12.7* 11.2* 10.4 10.2  NEUTROABS 8.9*  --   --   --   --   --   HGB 7.7* 7.3*  8.8* 8.5* 8.7* 8.5*  HCT 24.7* 22.9* 28.9* 26.5* 29.0* 27.6*  MCV 89.8 90.2 92.3 91.7 92.9 92.9  PLT 457* 468* 499* 431* 414* 435*   Cardiac Enzymes: No results for input(s): CKTOTAL, CKMB, CKMBINDEX, TROPONINI in the last 168 hours. CBG: Recent Labs  Lab 03/25/20 2314 03/26/20 0353 03/26/20 1143 03/26/20 1529 03/27/20 0626  GLUCAP 135* 104* 93 119* 134*    Studies/Results: No results found. Medications:   amLODipine  5 mg Oral Daily   atorvastatin  40 mg Oral q1800   buPROPion  150 mg Oral BID WC   cephALEXin  250 mg Oral Q12H   Chlorhexidine Gluconate Cloth  6 each Topical Daily   [START ON 03/28/2020] darbepoetin (ARANESP) injection - DIALYSIS  100 mcg Intravenous Q Thu-HD   docusate sodium  100 mg Oral BID   feeding supplement (NEPRO CARB STEADY)  237 mL Oral TID BM   FLUoxetine  10 mg Oral Daily   isosorbide mononitrate  15 mg Oral Daily   lamoTRIgine  100 mg Oral BID    levETIRAcetam  500 mg Oral Daily   loratadine  10 mg Oral Daily   mouth rinse  15 mL Mouth Rinse BID   melatonin  5 mg Oral QHS   metoprolol tartrate  12.5 mg Oral BID   multivitamin  1 tablet Oral QHS   Muscle Rub   Topical TID WC & HS   pantoprazole  40 mg Oral Daily   polyethylene glycol  17 g Oral Daily   pregabalin  75 mg Oral QHS   sevelamer carbonate  2,400 mg Oral TID WC

## 2020-03-27 NOTE — Progress Notes (Signed)
Physical Therapy Session Note  Patient Details  Name: Max Pittman. MRN: [id number] Date of Birth: [birth year]  Today's Date: 03/27/2020   Short Term Goals: Week 1:  PT Short Term Goal 1 (Week 1): pt will transfer sit<>stand with LRAD and CGA PT Short Term Goal 2 (Week 1): pt will transfer bed<>chair with LRAD and CGA PT Short Term Goal 3 (Week 1): pt will ambulate 69f with LRAD and min A of 1  Skilled Therapeutic Interventions/Progress Updates:  Pt received supine in bed, asleep. Pt unable to be aroused at this time. PT will re-attempt at later time/date provided pt is medically appropriate.     Therapy Documentation Precautions:  Precautions Precautions: Fall Precaution Comments: had fall 2/22 Restrictions Weight Bearing Restrictions: No General: PT Amount of Missed Time (min): 60 Minutes PT Missed Treatment Reason: Patient fatigue   Therapy/Group: Individual Therapy  ALorie Phenix3/02/2020, 4:04 PM

## 2020-03-27 NOTE — Progress Notes (Signed)
Patient ID: Max Chute., male   DOB: 04-13-1975, 45 y.o.   MRN: [id number]   Healthcare POA information provided to patient.  [name], [place]

## 2020-03-27 NOTE — Progress Notes (Signed)
[place] Individual Statement of Services  Patient Name:  Max Pittman.  Date:  03/27/2020  Welcome to the [facility].  Our goal is to provide you with an individualized program based on your diagnosis and situation, designed to meet your specific needs.  With this comprehensive rehabilitation program, you will be expected to participate in at least 3 hours of rehabilitation therapies Monday-Friday, with modified therapy programming on the weekends.  Your rehabilitation program will include the following services:  Physical Therapy (PT), Occupational Therapy (OT), Speech Therapy (ST), 24 hour per day rehabilitation nursing, Therapeutic Recreaction (TR), Neuropsychology, Care Coordinator, Rehabilitation Medicine, Nutrition Services, Pharmacy Services and Other  Weekly team conferences will be held on Tuesday to discuss your progress.  Your Inpatient Rehabilitation Care Coordinator will talk with you frequently to get your input and to update you on team discussions.  Team conferences with you and your family in attendance may also be held.  Expected length of stay: 14-18 Days  Overall anticipated outcome: Supervision to MOD I  Depending on your progress and recovery, your program may change. Your Inpatient Rehabilitation Care Coordinator will coordinate services and will keep you informed of any changes. Your Inpatient Rehabilitation Care Coordinator's name and contact numbers are listed  below.  The following services may also be recommended but are not provided by the [facility]:    [place] will be made to provide these services after discharge if needed.  Arrangements include referral to agencies that provide these services.  Your insurance has been verified to be:  Medicare A & B Your primary doctor is:  Anastasia Pall, MD  Pertinent  information will be shared with your doctor and your insurance company.  Inpatient Rehabilitation Care Coordinator:  Erlene Quan, [place] or [phone]  Information discussed with and copy given to patient by: Dyanne Iha, 03/27/2020, 11:47 AM

## 2020-03-28 ENCOUNTER — Encounter: Payer: Self-pay | Admitting: Adult Health

## 2020-03-28 LAB — CBC
HCT: 31.4 % — ABNORMAL LOW (ref 39.0–52.0)
Hemoglobin: 9.9 g/dL — ABNORMAL LOW (ref 13.0–17.0)
MCH: 29 pg (ref 26.0–34.0)
MCHC: 31.5 g/dL (ref 30.0–36.0)
MCV: 92.1 fL (ref 80.0–100.0)
Platelets: 374 10*3/uL (ref 150–400)
RBC: 3.41 MIL/uL — ABNORMAL LOW (ref 4.22–5.81)
RDW: 15.9 % — ABNORMAL HIGH (ref 11.5–15.5)
WBC: 11.4 10*3/uL — ABNORMAL HIGH (ref 4.0–10.5)
nRBC: 0 % (ref 0.0–0.2)

## 2020-03-28 LAB — GLUCOSE, CAPILLARY
Glucose-Capillary: 69 mg/dL — ABNORMAL LOW (ref 70–99)
Glucose-Capillary: 74 mg/dL (ref 70–99)
Glucose-Capillary: 75 mg/dL (ref 70–99)
Glucose-Capillary: 63 mg/dL — ABNORMAL LOW (ref 70–99)

## 2020-03-28 LAB — RENAL FUNCTION PANEL
Albumin: 2.4 g/dL — ABNORMAL LOW (ref 3.5–5.0)
Anion gap: 13 (ref 5–15)
BUN: 45 mg/dL — ABNORMAL HIGH (ref 6–20)
CO2: 24 mmol/L (ref 22–32)
Calcium: 9.5 mg/dL (ref 8.9–10.3)
Chloride: 99 mmol/L (ref 98–111)
Creatinine, Ser: 9.34 mg/dL — ABNORMAL HIGH (ref 0.61–1.24)
GFR, Estimated: 7 mL/min — ABNORMAL LOW (ref >60)
Glucose, Bld: 77 mg/dL (ref 70–99)
Phosphorus: 4.5 mg/dL (ref 2.5–4.6)
Potassium: 4.9 mmol/L (ref 3.5–5.1)
Sodium: 136 mmol/L (ref 135–145)

## 2020-03-28 MED ORDER — DARBEPOETIN ALFA 100 MCG/0.5ML IJ SOSY
PREFILLED_SYRINGE | INTRAMUSCULAR | Status: AC
  Filled 2020-03-28: qty 0.5

## 2020-03-28 NOTE — Progress Notes (Signed)
Occupational Therapy Session Note  Patient Details  Name: Max Pittman. MRN: [id number] Date of Birth: 12-23-1975  Today's Date: 03/28/2020 OT Individual Time: 0404-5913 OT Individual Time Calculation (min): 25 min   Short Term Goals: Week 1:  OT Short Term Goal 1 (Week 1): Pt will complete toilet transfers stand pivot with min assist OT Short Term Goal 2 (Week 1): Pt will complete toileting with min assist at sit > stand level OT Short Term Goal 3 (Week 1): Pt will complete 2 grooming tasks in standing for increased standing tolerance OT Short Term Goal 4 (Week 1): Pt will complete LB dressing at sit > stand level with supervision  Skilled Therapeutic Interventions/Progress Updates:    Pt greeted semi-reclined in bed and agreeable to OT treatment session. Pt completed bed mobility with supervision. LB dressing at EOB with pt able to achieve figure 4 position to thread underwear and pants. Sit<>stand in [place] as a precaution for knee buckling. Pt able to stand with min A without using knee blockers on [place], but when pulling up his pants, pt did experience knee buckling and needed mod A to return safely to sitting EOB. UB dressing at EOB with set-up. Pt brought to the sink in [place] and worked on maintaining standing in [place] while brushing teeth and washing face. Pt then completed 10 sit<>stand in [place] while trying not to use knee blockers. Pt returned to bed at end of session and left semi-reclined with bed alarm on, call bell in reach, and needs met.   Therapy Documentation Precautions:  Precautions Precautions: Fall Precaution Comments: had fall 2/22 Restrictions Weight Bearing Restrictions: No Pain: Pain Assessment Pain Scale: 0-10 Pain Score: no number given Pain Type: Acute pain Pain Location: Arm Pain Orientation: Left Pain Radiating Towards: right thigh Pain Descriptors / Indicators: Aching;Discomfort Pain Frequency: Constant Pain Onset: On-going Patients Stated Pain  Goal: 3 Pain Intervention(s): repositioned  Therapy/Group: Individual Therapy  Valma Cava 03/28/2020, 8:09 AM

## 2020-03-28 NOTE — Progress Notes (Signed)
Pt refused to get chlorhexidine bath this morning. Pt requested to get the chlorhexidine bath after breakfast. RN will pass information on to day shift nurse.

## 2020-03-28 NOTE — Progress Notes (Signed)
PROGRESS NOTE   Subjective/Complaints: WBC 11.4- afebrile Hgb up to 9.9 Cr down to 9.34, HD today Hypoglycemic this morning. CBGs under excellent control- d/c checks  Objective:   No results found. Recent Labs    03/26/20 0648 03/28/20 0652  WBC 10.2 11.4*  HGB 8.5* 9.9*  HCT 27.6* 31.4*  PLT 435* 374   Recent Labs    03/26/20 0648 03/28/20 0652  NA 137 136  K 4.2 4.9  CL 100 99  CO2 24 24  GLUCOSE 126* 77  BUN 43* 45*  CREATININE 9.53* 9.34*  CALCIUM 9.1 9.5    Intake/Output Summary (Last 24 hours) at 03/28/2020 1230 Last data filed at 03/28/2020 [id number] Gross per 24 hour  Intake 530 ml  Output 425 ml  Net 105 ml        Physical Exam: Vital Signs Blood pressure 122/84, pulse 79, temperature 97.9 F (36.6 C), temperature source Oral, resp. rate 16, height '5\' 9"'$  (1.753 m), weight 122.3 kg, SpO2 99 %. Gen: no distress, normal appearing HEENT: oral mucosa pink and moist, NCAT Cardio: Reg rate Chest: normal effort, normal rate of breathing Abdominal:  General: Bowel sounds are normal. There is nodistension.  Palpations: Abdomen is soft. There is nomass.  Tenderness: There is no abdominal tenderness.  Comments: Wounds from PD cath removal present, clean, intact Musculoskeletal:  General: Swelling(right thigh)and tenderness(right thigh, hip as well as left hip)present.  Cervical back: Normal range of motion.  Skin: General: Skin is warmand dry.  Comments: Left > right shin with pock marked healed lesions. Neurological:  Mental Status: He is alertand oriented to person, place, and time. Mental status isat baseline.  Cranial Nerves: No cranial nerve deficit.  Comments: Motor 4+/5 UE. RLE limited by pain 2/5 prox to 4/5 distally. LLE 3/5 prox to 4/5 distally. Distal LE sensory loss to LT. DTR's 1+ Psychiatric:  Mood and Affect: Moodnormal.   Behavior: Behaviornormal.  Thought Content: Thought contentnormal.    Assessment/Plan: 1. Functional deficits which require 3+ hours per day of interdisciplinary therapy in a comprehensive inpatient rehab setting.  Physiatrist is providing close team supervision and 24 hour management of active medical problems listed below.  Physiatrist and rehab team continue to assess barriers to discharge/monitor patient progress toward functional and medical goals  Care Tool:  Bathing    Body parts bathed by patient: Right arm,Left arm,Chest,Abdomen,Front perineal area,Buttocks,Right upper leg,Left upper leg,Right lower leg,Left lower leg,Face         Bathing assist Assist Level: Minimal Assistance - Patient > 75%     Upper Body Dressing/Undressing Upper body dressing   What is the patient wearing?: Pull over shirt    Upper body assist Assist Level: Supervision/Verbal cueing    Lower Body Dressing/Undressing Lower body dressing      What is the patient wearing?: Pants,Underwear/pull up     Lower body assist Assist for lower body dressing: Minimal Assistance - Patient > 75%     Toileting Toileting    Toileting assist Assist for toileting: Moderate Assistance - Patient 50 - 74% Assistive Device Comment: urinal   Transfers Chair/bed transfer  Transfers assist     Chair/bed transfer assist  level: Minimal Assistance - Patient > 75%     Locomotion Ambulation   Ambulation assist      Assist level: 2 helpers Assistive device: Walker-rolling Max distance: 70f   Walk 10 feet activity   Assist  Walk 10 feet activity did not occur: Safety/medical concerns (bilateral knee buckling, weakness, fatigue, decreased balance)        Walk 50 feet activity   Assist Walk 50 feet with 2 turns activity did not occur: Safety/medical concerns (bilateral knee buckling, weakness, fatigue, decreased balance)         Walk 150 feet activity   Assist Walk  150 feet activity did not occur: Safety/medical concerns (bilateral knee buckling, weakness, fatigue, decreased balance)         Walk 10 feet on uneven surface  activity   Assist Walk 10 feet on uneven surfaces activity did not occur: Safety/medical concerns (bilateral knee buckling, weakness, fatigue, decreased balance)         Wheelchair     Assist Will patient use wheelchair at discharge?: Yes Type of Wheelchair: Manual    Wheelchair assist level: Supervision/Verbal cueing Max wheelchair distance: >1574f   Wheelchair 50 feet with 2 turns activity    Assist        Assist Level: Supervision/Verbal cueing   Wheelchair 150 feet activity     Assist      Assist Level: Supervision/Verbal cueing   Blood pressure 122/84, pulse 79, temperature 97.9 F (36.6 C), temperature source Oral, resp. rate 16, height '5\' 9"'$  (1.753 m), weight 122.3 kg, SpO2 99 %.  Medical Problem List and Plan: 1.Functional and mobility deficitssecondary to debility after cardiac arrest, volume overload multiple medical. -patient may shower -ELOS/Goals: 18-20 days, supervision to min assist with self-care and w/c level mobility  -Continue 2. Antithrombotics: -DVT/anticoagulation:Mechanical:Sequential compression devices, below kneeBilateral lower extremities -antiplatelet therapy: To resume ASA March 7thper Dr. OrOlevia Bowensor CVA prophylaxis 3. Pain Management:Will d/c IV dilaudid--> continue oxycodone prn, doing well with current regimen.  -discussed Kpad to right thigh hip as well. 4. Mood:LCSW to follow for evaluation and support. -antipsychotic agents: N/A 5. Neuropsych: This patientiscapable of making decisions on hisown behalf. 6. Skin/Wound Care:Routine pressure relief measures. 7. Fluids/Electrolytes/Nutrition:Strict I/O. Monitor weights daily. Renal diet with 1200 cc/FR --consult  dietician to educate patient on renal diet.  8. ESRD: Has been transitioned to HD--[facility] --schedule HD at the end of the day to help with tolerance of therapy  Cr down to 9.34- monitor with HD labs 9. H/o Seizure d/o: Continue Lamictal and Keppra--monitor for breakthrough seizures.  10. T2DM: Monitor BS ac/hs--has been diet controlled. --Continue SSI for now 11. HTN:Hgb A1c-5.6 and diet controlled for the past 9 months.  --will monitor BP tid--has been trending upwards.  --Continue Amlodipine, metoprolol and Imdur and titrate medications as indicated.  --Will order orthostatic vitals as continues to have increased nausea with activity.  12. Spontaneous, large right iliopsoashematoma:  Leucocytosis increasing, continue to monitor with HD. Afebrile. Hgb stable at 8.5 -pain control as above 13. 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  -consult with ortho while here regarding considerations. Not a surgical candidate at this point, and he might never be unfortunately.  -pain control and activity modification.  14. OSA: Non-compliant with CPAP. 15. Acute on chronic anemia: Continue to monitor H/H. ON aranesp weekly.  --INR  slowing normalizing  Hgb up to 9.9 on 3/3, repeat with next HD labs  16.Neuropathy: acute on chronic now with worsening of symptoms RLE due to compression. 17. Dizziness: meclizine and vestibular eval ordered. Advised to ask for Meclizine PRN if needed.     LOS: 2 days A FACE TO FACE EVALUATION WAS PERFORMED  Max Pittman 03/28/2020, 12:30 PM

## 2020-03-28 NOTE — Progress Notes (Signed)
Physical Therapy Session Note  Patient Details  Name: Max Pittman. MRN: [id number] Date of Birth: 1975/02/21  Today's Date: 03/28/2020 PT Individual Time: [id number] PT Individual Time Calculation (min): 68 min   Short Term Goals: Week 1:  PT Short Term Goal 1 (Week 1): pt will transfer sit<>stand with LRAD and CGA PT Short Term Goal 2 (Week 1): pt will transfer bed<>chair with LRAD and CGA PT Short Term Goal 3 (Week 1): pt will ambulate 76f with LRAD and min A of 1  Skilled Therapeutic Interventions/Progress Updates:   Received pt semi-reclined in bed, pt agreeable to therapy, and reported pain 8/10 in BLE and reported feeling nauseous. RN notified and present to administer medications. Nephrology MD present during session. Session with emphasis on functional mobility/transfers, generalized strengthening, dynamic standing balance/coordination, simulated car transfers, and improved activity tolerance. Pt transferred semi-reclined<>sitting EOB with supervision and transferred bed<>WC stand<>pivot with RW and min A. Pt performed WC mobility 121fx1 and 10033f 1 and on/off elevator to ortho gym using BUE and supervision with extensive rest break afterwards. Pt performed simulated car transfer with RW and min A and required use of UEs to lift LEs into car. Pt performed seated BLE strengthening Kinetron at 30 cm/sec for 1 minute x 4 trials with emphasis on quad/glute strengthening. Pt with difficulty maintaining LLE flat on footplate due to leg length discrepancy and with excessive L hip abduction requiring use of gait belt for correct alignment. Pt performed the following exercises sitting in WC with supervision and verbal cues for technique: -LAQ 2x10 bilaterally -bicep curls with 4lb dumbbells 2x12 -overhead shoulder press with 4lb dumbbell 2x10 bilaterally -hip adduction ball squeezes 2x10 with 5 second isometric hold -WC pushups x8 Pt reported 8/10 fatigue at end of session and  transported back to room on 5C in WC total A. Stand<>pivot WC<>bed with RW and min A and sit<>supine with supervision. Concluded session with pt semi-reclined in bed, needs within reach, and bed alarm on. NT present at bedside checking blood glucose levels.   Therapy Documentation Precautions:  Precautions Precautions: Fall Precaution Comments: had fall 2/22 Restrictions Weight Bearing Restrictions: No  Therapy/Group: Individual Therapy AnnAlfonse Alpers, DPT   03/28/2020, 7:26 AM

## 2020-03-28 NOTE — Progress Notes (Signed)
Renal Navigator updated patient at bedside to confirm that he has a MWF seat at [facility] (as we talked about earlier this week) starting 3/14 or later. We do not anticipate discharge from CIR prior to 3/14, but if that turns out to be the case, Navigator will make other arrangements for him at his clinic until his permanent seat on 3/14. Patient states therapy went well today and understands that we will be getting him on a MWF schedule as of Monday 3/7. Navigator has discussed with CIR CSW.  Navigator will continue to follow and is available for support and assistance as needed while patient is in CIR.  Alphonzo Cruise, [place] Renal Navigator [phone]

## 2020-03-28 NOTE — Progress Notes (Signed)
Patient ID: Max Pittman., male   DOB: 1975-05-28, 45 y.o.   MRN: [id number] [facility] Progress Note   Assessment/ Plan:   1.  Acute hypoxic respiratory failure/ cardiac arrest: due to severe volume overload/pulmonary edema causing asystolic cardiac arrest. Resolved. Now in rehab due to deconditioning  2. ESRD: severe vol overload on admission. Had CRRT w/ UF of 31 L. Suspected membrane failure so cannot do PD anymore. Transitioning to HD. IR placed [place] on 2/23. PD cath removed 2/25 per gen surgery. TTS HD here, next HD due 3/3. Is interested in home hemo-  Usually we want a fistula for that - did talk about that briefly but may be too soon right now given what he has been through.  Now in inpatient rehab- will look to get AVF done toward the end of his stay.  Tentatively for now has OP spot at [facility] on MWF early AM.  Reports nausea with tx-  Will look at UF goal for next time.  Will do today, Sat , then Monday to get on OP schedule 3. Retroperitoneal hematoma: seen by CT 2/24 large R iliopsoas hematoma 10 x 7 x 20 cm. Anticoagulation on hold.  4. Anemia ckd: Hb in 8- 9 range, sp darbe 100 ug  q thurs.  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 Nutrition: On renal diet  7. Hypertension: euvolemic, holding wt's high 110 kg's. Continues on norvasc/ metoprolol.    8. 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 H/o CVA/ hx seizures - taking lamictal and keppra 10. Dispo - -  Now on inpatient rehab    [name]  03/28/2020, 10:53 AM    Subjective:   Some nausea but not related to constipation-  Pain is reasonably controlled -  Due for HD today    Objective:   BP 122/84 (BP Location: Right Arm)   Pulse 79   Temp 97.9 F (36.6 C) (Oral)   Resp 16   Ht '5\' 9"'$  (1.753 m)   Wt 122.3 kg   SpO2 99%   BMI 39.82 kg/m   Physical Exam: Gen: Resting comfortably in bed CVS: RRR  no mrg   Resp: CTA, no rales or wheezing Abd: Soft, obese, nontender, pd cath removed Ext: no LE or UE edema  Thrombosed LUA AVF   R IJ [facility]    Labs: BMET Recent Labs  Lab 03/21/20 1203 03/23/20 0733 03/26/20 0648 03/28/20 0652  NA 133* 135 137 136  K 3.8 4.1 4.2 4.9  CL 96* 99 100 99  CO2 21* '23 24 24  '$ GLUCOSE 97 94 126* 77  BUN 58* 36* 43* 45*  CREATININE 9.64* 8.41* 9.53* 9.34*  CALCIUM 8.7* 8.7* 9.1 9.5  PHOS  --   --  5.3* 4.5   CBC Recent Labs  Lab 03/24/20 0140 03/25/20 0104 03/26/20 0648 03/28/20 0652  WBC 11.2* 10.4 10.2 11.4*  HGB 8.5* 8.7* 8.5* 9.9*  HCT 26.5* 29.0* 27.6* 31.4*  MCV 91.7 92.9 92.9 92.1  PLT 431* 414* 435* 374     Medications:    . amLODipine  5 mg Oral Daily  . atorvastatin  40 mg Oral q1800  . buPROPion  150 mg Oral BID WC  . cephALEXin  250 mg Oral Q12H  . Chlorhexidine Gluconate Cloth  6 each Topical Daily  . darbepoetin (ARANESP) injection -  DIALYSIS  100 mcg Intravenous Q Thu-HD  . docusate sodium  100 mg Oral BID  . feeding supplement (NEPRO CARB STEADY)  237 mL Oral TID BM  . FLUoxetine  10 mg Oral Daily  . isosorbide mononitrate  15 mg Oral Daily  . lamoTRIgine  100 mg Oral BID  . levETIRAcetam  500 mg Oral Daily  . loratadine  10 mg Oral Daily  . mouth rinse  15 mL Mouth Rinse BID  . melatonin  5 mg Oral QHS  . metoprolol tartrate  12.5 mg Oral BID  . multivitamin  1 tablet Oral QHS  . Muscle Rub   Topical TID WC & HS  . pantoprazole  40 mg Oral Daily  . polyethylene glycol  17 g Oral Daily  . pregabalin  75 mg Oral QHS  . sevelamer carbonate  2,400 mg Oral TID WC

## 2020-03-28 NOTE — Progress Notes (Signed)
Occupational Therapy Session Note  Patient Details  Name: Max Pittman. MRN: [id number] Date of Birth: 05/26/1975  Today's Date: 03/28/2020 OT Individual Time: 0900-1000 OT Individual Time Calculation (min): 60 min    Short Term Goals: Week 1:  OT Short Term Goal 1 (Week 1): Pt will complete toilet transfers stand pivot with min assist OT Short Term Goal 2 (Week 1): Pt will complete toileting with min assist at sit > stand level OT Short Term Goal 3 (Week 1): Pt will complete 2 grooming tasks in standing for increased standing tolerance OT Short Term Goal 4 (Week 1): Pt will complete LB dressing at sit > stand level with supervision Week 2:     Skilled Therapeutic Interventions/Progress Updates:    1:1 Pt already dressed and sitting up in bed when arrived. Pt reports still feeling nauseous and pain in left upper shoulder and in right hip; but was agreeable to working as he tolerated. Started in bed in sidelying with hip abduction with ankles together and then lifting Le against gravity (still in sidelying) - he required A with left Le. Then perform him extension in sidelying on both sides -against with assistance for Left left against gravity. In supine performed bridging while applying pressure to ball inbetween LEs and sustained bridged position.   Transitioned to EOB with supervision with bed rail and began practicing sit to stands.  With further investigation and discussion; Pt with left LE discrepancy resulting in standing on his toes on his left and creating more instability in his right knee / pain in right hip. Pt didn't have shoes today and would discuss with significant other about brining them in tomorrow. Applied a 1/4 inch  Thick shoe lift under his entire foot. Continued to practice sit to stands with shoe lift under foot. Pt able to come into standing with min A with RW for UE support. Pt reports feeling better on his feet and less pressure on his right knee and hip. Continued  practice with sit to stands and controlled descents.  Discussed with PT and orthotist  about an ortho consult for adjustments for shoes.    Therapy Documentation Precautions:  Precautions Precautions: Fall Precaution Comments: had fall 2/22 Restrictions Weight Bearing Restrictions: No    Pain: Pain Assessment Pain Scale: 0-10 Pain Score: 7  Pain Type: Acute pain Pain Location: Back Pain Orientation: Right;Left Pain Radiating Towards: right hip and lower back Pain Descriptors / Indicators: Aching;Throbbing;Sharp Pain Frequency: Constant Pain Onset: On-going Pain Intervention(s): Medication (See eMAR)   Therapy/Group: Individual Therapy  Willeen Cass [facility] 03/28/2020, 12:47 PM

## 2020-03-28 NOTE — Progress Notes (Signed)
Hypoglycemic Event  CBG: 69  Treatment: 4 oz juice/soda  Symptoms: None  Follow-up CBG: Time:0701am CBG Result:74  Possible Reasons for Event: Unknown  Comments/MD notified: Protocol followed, order initiated. Charge nurse notified.    Buck Mam

## 2020-03-28 NOTE — Telephone Encounter (Signed)
Noted! Thank you

## 2020-03-28 NOTE — Progress Notes (Signed)
[facility] Progress Note   Subjective:   Patient seen and examined at bedside.  Reports nausea post HD that mostly starts on the way back to the unit. States it use to happen when he 1st started HD when on the drive home.  ?component of motion sickness?  Objective Vitals:   03/27/20 0500 03/27/20 1353 03/27/20 1938 03/28/20 0500  BP:  105/69 105/71 122/84  Pulse:  82 85 79  Resp:  '17 16 16  '$ Temp:  (!) 97.3 F (36.3 C) 98.7 F (37.1 C) 97.9 F (36.6 C)  TempSrc:  Oral Oral Oral  SpO2:  96% 95% 99%  Weight: 119.6 kg   122.3 kg  Height:       Physical Exam General:chronically ill appearing male in NAD Heart:RRR, no mrg Lungs:CTAB, nml WOB, no crackles or wheezing Abdomen:soft, obese, mildly tender below the umbilicus  Extremities:trace LE edema R>L Dialysis Access: R IJ TDC, thrombosed LU AVF   Filed Weights   03/26/20 1500 03/27/20 0500 03/28/20 0500  Weight: 119.1 kg 119.6 kg 122.3 kg    Intake/Output Summary (Last 24 hours) at 03/28/2020 1042 Last data filed at 03/28/2020 0728 Gross per 24 hour  Intake 648 ml  Output 425 ml  Net 223 ml    Additional Objective Labs: Basic Metabolic Panel: Recent Labs  Lab 03/23/20 0733 03/26/20 0648 03/28/20 0652  NA 135 137 136  K 4.1 4.2 4.9  CL 99 100 99  CO2 '23 24 24  '$ GLUCOSE 94 126* 77  BUN 36* 43* 45*  CREATININE 8.41* 9.53* 9.34*  CALCIUM 8.7* 9.1 9.5  PHOS  --  5.3* 4.5   Liver Function Tests: Recent Labs  Lab 03/23/20 0733 03/26/20 0648 03/28/20 0652  AST 19  --   --   ALT 5  --   --   ALKPHOS 73  --   --   BILITOT 0.6  --   --   PROT 6.2*  --   --   ALBUMIN 1.9* 2.0* 2.4*   CBC: Recent Labs  Lab 03/23/20 0149 03/24/20 0140 03/25/20 0104 03/26/20 0648 03/28/20 0652  WBC 12.7* 11.2* 10.4 10.2 11.4*  HGB 8.8* 8.5* 8.7* 8.5* 9.9*  HCT 28.9* 26.5* 29.0* 27.6* 31.4*  MCV 92.3 91.7 92.9 92.9 92.1  PLT 499* 431* 414* 435* 374   CBG: Recent Labs  Lab 03/27/20 1138 03/27/20 1648  03/27/20 2103 03/28/20 0639 03/28/20 0701  GLUCAP 99 120* 93 69* 74    Medications:  . amLODipine  5 mg Oral Daily  . atorvastatin  40 mg Oral q1800  . buPROPion  150 mg Oral BID WC  . cephALEXin  250 mg Oral Q12H  . Chlorhexidine Gluconate Cloth  6 each Topical Daily  . darbepoetin (ARANESP) injection - DIALYSIS  100 mcg Intravenous Q Thu-HD  . docusate sodium  100 mg Oral BID  . feeding supplement (NEPRO CARB STEADY)  237 mL Oral TID BM  . FLUoxetine  10 mg Oral Daily  . isosorbide mononitrate  15 mg Oral Daily  . lamoTRIgine  100 mg Oral BID  . levETIRAcetam  500 mg Oral Daily  . loratadine  10 mg Oral Daily  . mouth rinse  15 mL Mouth Rinse BID  . melatonin  5 mg Oral QHS  . metoprolol tartrate  12.5 mg Oral BID  . multivitamin  1 tablet Oral QHS  . Muscle Rub   Topical TID WC & HS  . pantoprazole  40 mg  Oral Daily  . polyethylene glycol  17 g Oral Daily  . pregabalin  75 mg Oral QHS  . sevelamer carbonate  2,400 mg Oral TID WC    Dialysis Orders: PD patient now transitioned to HD.  Has seat at [facility] MWF on d/c  Assessment/Plan: 1.  Acute hypoxic respiratory failure/ cardiac arrest: due to severe volume overload/pulmonary edema causing asystolic cardiac arrest. Resolved w/ CRRT.  2. Volume overload - Severe volume overload on admit.  S/p CRRT w/UF 31L.   2. ESRD: Suspected membrane failure so cannot do PD anymore. Transitioned to HD. IR placed [name] on 2/23. PD cath removed 2/25 per gen surgery. TTS HD here, next HD today, will change to MWF schedule on Monday to prepare for outpatient HD. Is interested in home hemo - Usually we want a fistula for that - Dr. Moshe Cipro discussed with him briefly but believes it may be too soon right now given what he has been through.  Now in CIR will plan to have permanent access placed near end of admit. Tentatively for now has OP spot at [facility] on MWF early AM.  Reports nausea with tx, specifically on way back to  room.  Will try profile and see if improved.  ?component of motion sickness. Last K 4.9. 3. Retroperitoneal hematoma: seen by CT 2/24 large R iliopsoas hematoma 10 x 7 x 20 cm. Anticoagulation on hold.  4. Anemia ckd: Hgb^9.9, on darbe 100 ug  q thurs going ahead.   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 Nutrition: On renal diet  7. Hypertension: Blood pressures in goal and now euvolemic, continues on norvasc/ metoprolol.   Weights around 119-120kg.  8. 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 H/o CVA/ hx seizures - taking lamictal and keppra 10. Dispo -  Now in inpatient rehab   Jen Mow, [place] 03/28/2020,10:42 AM  LOS: 2 days

## 2020-03-29 MED ORDER — SCOPOLAMINE 1 MG/3DAYS TD PT72
1.0000 | MEDICATED_PATCH | TRANSDERMAL | Status: DC
  Administered 2020-03-29: 1.5 mg via TRANSDERMAL
  Filled 2020-03-29 (×2): qty 1

## 2020-03-29 MED ORDER — CHLORHEXIDINE GLUCONATE CLOTH 2 % EX PADS
6.0000 | MEDICATED_PAD | Freq: Every day | CUTANEOUS | Status: DC
  Administered 2020-03-30: 6 via TOPICAL

## 2020-03-29 NOTE — Progress Notes (Signed)
Physical Therapy Session Note  Patient Details  Name: Max Pittman. MRN: [id number] Date of Birth: 29-Nov-1975  Today's Date: 03/29/2020 PT Individual Time: 0805-0850 and 1302-1400 PT Individual Time Calculation (min): 45 min and 58 min  Short Term Goals: Week 1:  PT Short Term Goal 1 (Week 1): pt will transfer sit<>stand with LRAD and CGA PT Short Term Goal 2 (Week 1): pt will transfer bed<>chair with LRAD and CGA PT Short Term Goal 3 (Week 1): pt will ambulate 55f with LRAD and min A of 1  Skilled Therapeutic Interventions/Progress Updates:  Session 1  Pt received supine in bed and agreeable to PT. Supine>sit transfer with supervision assist and heavy use of bed rails.  Seated therex. LAQ x 12, hip abduction x 12 with manual resistance, ankle PF x 15, ankle DF x 15, hip flexion x 12 with AAROM on the LLE. Pt then returned to supine through long sitting using UE to control the LLE.    Supine therex heel slide x 12, hip abduction x 10, SLR x 8 , bridges x 10, cues for decreased speed of eccentric movement and min assist to stabilize the LLE in neurtal ER/IR.   Gait training with RW x 142fin room with min assist. Pt noted to circumduct the LLE and advance the RLE with forefoot stance on the LLE. Sit<>stand with RW and min assist from elevated bed height. Pt reports difficulty raising from low WC height into standing.   PT attempted to raise WC seat, but unable to raise drive wheels, as WC set up for posterior axel placement. Pt left supine in bed with call bell in reach and all needs met.     Session 2.   Pt received sitting in EOB and agreeable to PT. Pt [facility] and min assist from PT for safety.   Pt performed WC propulsion through hospital and hall of rehab unit x [id number] supervision assist with min cues for obstacle navigation in tight space through therapy gym.   Orthotist present to assess leg length discrepancy and  make recommendations for SO. Gait training with orthotist present 2 x 15ft 49f RW and min assist for safety. Noted to maintain knee extension on the LLE and WB through forefoot due to leg length.   Stand pivot transfer to and from nustep with RW and min assist from PT for safety. BLE/BUE activity tolerance training x 10 minutes with multiple short therapeutic rest breaks. Towel roll under the LLE to accommodate the leg length discrepancy on the L.    Patient returned to room and left sitting in EOB with call bell in reach and all needs met.          Therapy Documentation Precautions:  Precautions Precautions: Fall Precaution Comments: had fall 2/22 Restrictions Weight Bearing Restrictions: No Vital Signs: Therapy Vitals Temp: 99.7 F (37.6 C) Temp Source: Oral Pulse Rate: (!) 102 Resp: 17 BP: 111/69 Patient Position (if appropriate): Lying Oxygen Therapy SpO2: 93 % O2 Device: Room Air Pain: Pain Assessment Pain Scale: 0-10 Pain Score: 7  Pain Type: Chronic pain Pain Location: Back Pain Orientation: Lower Pain Radiating Towards: back Pain Descriptors / Indicators: Aching Pain Frequency: Constant Pain Onset: On-going Patients Stated Pain Goal: 3 Pain Intervention(s): Medication (See eMAR)    Therapy/Group: Individual Therapy  AustinLorie [id number], 8:51 AM

## 2020-03-29 NOTE — Progress Notes (Signed)
Occupational Therapy Session Note  Patient Details  Name: Max Pittman. MRN: [id number] Date of Birth: 1975-04-17  Today's Date: 03/29/2020 OT Individual Time: [id number] OT Individual Time Calculation (min): 58 min    Short Term Goals: Week 1:  OT Short Term Goal 1 (Week 1): Pt will complete toilet transfers stand pivot with min assist OT Short Term Goal 2 (Week 1): Pt will complete toileting with min assist at sit > stand level OT Short Term Goal 3 (Week 1): Pt will complete 2 grooming tasks in standing for increased standing tolerance OT Short Term Goal 4 (Week 1): Pt will complete LB dressing at sit > stand level with supervision  Skilled Therapeutic Interventions/Progress Updates:    Treatment session with focus on self-care retraining, sit > stand, and improved standing balance/tolerance.  Pt received upright in bed expressing desire to wash up and change clothes.  Pt completed UB bathing and dressing seated EOB with setup assist.  Pt then able to complete LB bathing and dressing at sit > stand level with RW and CGA for standing balance due to BLE instability.  Pt able to advance pants over hips while standing with only CGA for standing balance.  Pt continues to report pain in RLE and L hip.  Discussed pain may be partially due to relying on RLE due to leg length discrepancy and reported instability of hardware in L hip.  Engaged in sit > stand in [place] due to pain in BLE and completed oral care in standing in [place].  Facilitated increased weight shifting and balance activity while standing in [place] to reach up to L to obtain items.  Pt returned to EOB and engaged in sit > stand with 1/4 inch wedge under L foot, pt reporting decreased pain and pressure through R knee and hip.  Pt returned to semi-reclined in bed due to pain and fatigue.    RN arrived at end of session to provide pain meds. MD also arriving for morning rounds.  Therapy Documentation Precautions:   Precautions Precautions: Fall Precaution Comments: had fall 2/22 Restrictions Weight Bearing Restrictions: No Pain: Pain Assessment Pain Scale: 0-10 Pain Score: 7 Faces Pain Scale: Hurts a little bit Pain Type: Chronic pain Pain Location: Back Pain Orientation: Lower Pain Radiating Towards: legs Pain Descriptors / Indicators: Aching;Throbbing Pain Frequency: Constant Pain Onset: On-going Patients Stated Pain Goal: 3 Pain Intervention(s): Medication (See eMAR) Multiple Pain Sites: Yes 2nd Pain Site Pain Score: 9 Wong-Baker Pain Rating: Hurts even more Pain Type: Chronic pain Pain Location: Hip Pain Orientation: Left;Right   Therapy/Group: Individual Therapy  Simonne Come 03/29/2020, 12:20 PM

## 2020-03-29 NOTE — Progress Notes (Signed)
Patient ID: Max Chute., male   DOB: [birth year], 45 y.o.   MRN: [id number] [facility] Progress Note   Assessment/ Plan:   1.  Acute hypoxic respiratory failure/ cardiac arrest: due to severe volume overload/pulmonary edema causing asystolic cardiac arrest. Resolved. Now in rehab due to deconditioning  2. ESRD: severe vol overload on admission. Had CRRT w/ UF of 31 L.  membrane failure so cannot do PD anymore. Transitioned to HD. IR placed [place] on 2/23. PD cath removed 2/25 per gen surgery. TTS HD here, next HD due 3/3. Is interested in home hemo-  Usually we want a fistula for that - did talk about that briefly but may be too soon right now given what he has been through.  Now in inpatient rehab- will look to get AVF done toward the end of his stay.  Tentatively for now has OP spot at [facility] on MWF early AM.  Will do Sat , then Monday to get on OP schedule 3. Retroperitoneal hematoma: seen by CT 2/24 large R iliopsoas hematoma 10 x 7 x 20 cm. Anticoagulation on hold.  4. Anemia ckd: Hb in 8- 9 range, sp darbe 100 ug  q thurs.  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 Nutrition: On renal diet  7. Hypertension: euvolemic, holding wt's high 110 kg's.  on norvasc/ metoprolol- actually will stop norvasc given soft BPs.    8. 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 H/o CVA/ hx seizures - taking lamictal and keppra 10. Dispo - -  Now on inpatient rehab - will not know tentative d/c date til 3/8 11. Nausea-  Associated some with HD, Some soft BPs - stop norvasc   [name]  03/29/2020, 8:50 AM    Subjective:   Some nausea still, but improved-  Pain is reasonably controlled -  HD yest-  Removed 2000   Objective:   BP 111/69 (BP Location: Right Arm)   Pulse (!) 102   Temp 99.7 F (37.6 C) (Oral)   Resp 17   Ht '5\' 9"'$  (1.753 m)   Wt 119.6 kg   SpO2 93%   BMI 38.94 kg/m    Physical Exam: Gen: Resting comfortably in bed CVS: RRR no mrg   Resp: CTA, no rales or wheezing Abd: Soft, obese, nontender, pd cath removed Ext: no LE or UE edema  Thrombosed LUA AVF   R IJ [facility]    Labs: BMET Recent Labs  Lab 03/23/20 0733 03/26/20 0648 03/28/20 0652  NA 135 137 136  K 4.1 4.2 4.9  CL 99 100 99  CO2 '23 24 24  '$ GLUCOSE 94 126* 77  BUN 36* 43* 45*  CREATININE 8.41* 9.53* 9.34*  CALCIUM 8.7* 9.1 9.5  PHOS  --  5.3* 4.5   CBC Recent Labs  Lab 03/24/20 0140 03/25/20 0104 03/26/20 0648 03/28/20 0652  WBC 11.2* 10.4 10.2 11.4*  HGB 8.5* 8.7* 8.5* 9.9*  HCT 26.5* 29.0* 27.6* 31.4*  MCV 91.7 92.9 92.9 92.1  PLT 431* 414* 435* 374     Medications:    . amLODipine  5 mg Oral Daily  . atorvastatin  40 mg Oral q1800  . buPROPion  150 mg Oral BID WC  . cephALEXin  250 mg Oral Q12H  . Chlorhexidine Gluconate Cloth  6 each Topical Daily  . darbepoetin (ARANESP) injection - DIALYSIS  100 mcg Intravenous Q Thu-HD  . docusate sodium  100 mg Oral BID  . feeding supplement (NEPRO CARB STEADY)  237 mL Oral TID BM  . FLUoxetine  10 mg Oral Daily  . isosorbide mononitrate  15 mg Oral Daily  . lamoTRIgine  100 mg Oral BID  . levETIRAcetam  500 mg Oral Daily  . loratadine  10 mg Oral Daily  . mouth rinse  15 mL Mouth Rinse BID  . melatonin  5 mg Oral QHS  . metoprolol tartrate  12.5 mg Oral BID  . multivitamin  1 tablet Oral QHS  . Muscle Rub   Topical TID WC & HS  . pantoprazole  40 mg Oral Daily  . polyethylene glycol  17 g Oral Daily  . pregabalin  75 mg Oral QHS  . sevelamer carbonate  2,400 mg Oral TID WC

## 2020-03-29 NOTE — Progress Notes (Signed)
PROGRESS NOTE   Subjective/Complaints: Complaining of pain in right leg, wraps around back, into left leg- has been limiting participation in therapy. He has been using heat for lower back which helps. Percocet helps.   ROS: +right lower extremity pain  Objective:   No results found. Recent Labs    03/28/20 0652  WBC 11.4*  HGB 9.9*  HCT 31.4*  PLT 374   Recent Labs    03/28/20 0652  NA 136  K 4.9  CL 99  CO2 24  GLUCOSE 77  BUN 45*  CREATININE 9.34*  CALCIUM 9.5    Intake/Output Summary (Last 24 hours) at 03/29/2020 1313 Last data filed at 03/29/2020 [id number] Gross per 24 hour  Intake 300 ml  Output 2425 ml  Net -2125 ml        Physical Exam: Vital Signs Blood pressure 111/69, pulse (!) 102, temperature 99.7 F (37.6 C), temperature source Oral, resp. rate 17, height '5\' 9"'$  (1.753 m), weight 119.6 kg, SpO2 93 %. Gen: no distress, normal appearing HEENT: oral mucosa pink and moist, NCAT Cardio: Tachycardic Chest: normal effort, normal rate of breathing Abdominal:  General: Bowel sounds are normal. There is nodistension.  Palpations: Abdomen is soft. There is nomass.  Tenderness: There is no abdominal tenderness.  Comments: Wounds from PD cath removal present, clean, intact Musculoskeletal:  General: Swelling(right thigh)and tenderness(right thigh, hip as well as left hip)present.  Cervical back: Normal range of motion.  Skin: General: Skin is warmand dry.  Comments: Left > right shin with pock marked healed lesions. Neurological:  Mental Status: He is alertand oriented to person, place, and time. Mental status isat baseline.  Cranial Nerves: No cranial nerve deficit.  Comments: Motor 4+/5 UE. RLE limited by pain 2/5 prox to 4/5 distally. LLE 3/5 prox to 4/5 distally. Distal LE sensory loss to LT. DTR's 1+ Psychiatric:  Mood and Affect: Moodnormal.   Behavior: Behaviornormal.  Thought Content: Thought contentnormal.    Assessment/Plan: 1. Functional deficits which require 3+ hours per day of interdisciplinary therapy in a comprehensive inpatient rehab setting.  Physiatrist is providing close team supervision and 24 hour management of active medical problems listed below.  Physiatrist and rehab team continue to assess barriers to discharge/monitor patient progress toward functional and medical goals  Care Tool:  Bathing    Body parts bathed by patient: Right arm,Left arm,Chest,Abdomen,Front perineal area,Buttocks,Right upper leg,Left upper leg,Right lower leg,Left lower leg,Face         Bathing assist Assist Level: Contact Guard/Touching assist     Upper Body Dressing/Undressing Upper body dressing   What is the patient wearing?: Pull over shirt    Upper body assist Assist Level: Supervision/Verbal cueing    Lower Body Dressing/Undressing Lower body dressing      What is the patient wearing?: Pants,Underwear/pull up     Lower body assist Assist for lower body dressing: Contact Guard/Touching assist     Toileting Toileting    Toileting assist Assist for toileting: Independent with assistive device Assistive Device Comment: urinal   Transfers Chair/bed transfer  Transfers assist     Chair/bed transfer assist level: Minimal Assistance - Patient > 75%  Locomotion Ambulation   Ambulation assist      Assist level: 2 helpers Assistive device: Walker-rolling Max distance: 75f   Walk 10 feet activity   Assist  Walk 10 feet activity did not occur: Safety/medical concerns (bilateral knee buckling, weakness, fatigue, decreased balance)        Walk 50 feet activity   Assist Walk 50 feet with 2 turns activity did not occur: Safety/medical concerns (bilateral knee buckling, weakness, fatigue, decreased balance)         Walk 150 feet activity   Assist Walk 150 feet activity  did not occur: Safety/medical concerns (bilateral knee buckling, weakness, fatigue, decreased balance)         Walk 10 feet on uneven surface  activity   Assist Walk 10 feet on uneven surfaces activity did not occur: Safety/medical concerns (bilateral knee buckling, weakness, fatigue, decreased balance)         Wheelchair     Assist Will patient use wheelchair at discharge?: Yes Type of Wheelchair: Manual    Wheelchair assist level: Supervision/Verbal cueing Max wheelchair distance: >1580f   Wheelchair 50 feet with 2 turns activity    Assist        Assist Level: Supervision/Verbal cueing   Wheelchair 150 feet activity     Assist      Assist Level: Supervision/Verbal cueing   Blood pressure 111/69, pulse (!) 102, temperature 99.7 F (37.6 C), temperature source Oral, resp. rate 17, height '5\' 9"'$  (1.753 m), weight 119.6 kg, SpO2 93 %.  Medical Problem List and Plan: 1.Functional and mobility deficitssecondary to debility after cardiac arrest, volume overload multiple medical. -patient may shower -ELOS/Goals: 18-20 days, supervision to min assist with self-care and w/c level mobility  -Continue CIR 2. Antithrombotics: -DVT/anticoagulation:Mechanical:Sequential compression devices, below kneeBilateral lower extremities -antiplatelet therapy: To resume ASA March 7thper Dr. OrOlevia Bowensor CVA prophylaxis 3. Pain Management:Will d/c IV dilaudid--> continue oxycodone prn, doing well with current regimen.   3/4: TENS unit ordered. Encouraged heat for lower back -discussed Kpad to right thigh hip as well. 4. Mood:LCSW to follow for evaluation and support. -antipsychotic agents: N/A 5. Neuropsych: This patientiscapable of making decisions on hisown behalf. 6. Skin/Wound Care:Routine pressure relief measures. 7. Fluids/Electrolytes/Nutrition:Strict I/O. Monitor weights daily. Renal  diet with 1200 cc/FR --consult dietician to educate patient on renal diet.  8. ESRD: Has been transitioned to HD[facility] --schedule HD at the end of the day to help with tolerance of therapy  Cr down to 9.34- monitor with HD labs 9. H/o Seizure d/o: Continue Lamictal and Keppra--monitor for breakthrough seizures.  10. T2DM: Monitor BS ac/hs--has been diet controlled. --Continue SSI for now 11. HTN:Hgb A1c-5.6 and diet controlled for the past 9 months.  --will monitor BP tid--has been trending upwards.  --Continue Amlodipine, metoprolol and Imdur and titrate medications as indicated.  --Will order orthostatic vitals as continues to have increased nausea with activity.  12. Spontaneous, large right iliopsoashematoma:  Leucocytosis increasing, continue to monitor with HD. Afebrile. Hgb stable at 8.5 -pain control as above 13. 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  -consult with ortho while here regarding considerations. Not a surgical candidate at this point, and he might never be unfortunately.  -pain control and activity modification.  14. OSA: Non-compliant with CPAP. 15. Acute on chronic anemia: Continue to monitor H/H. ON aranesp weekly.  --  INR slowing normalizing  Hgb up to 9.9 on 3/3, repeat with next HD labs  16.Neuropathy: acute on chronic now with worsening of symptoms RLE due to compression.  3/4: can discuss increasing Lyrica dose.  17. Dizziness: meclizine and vestibular eval ordered. Advised to ask for Meclizine PRN if needed.   3/4: scopolamine patch ordered since Meclizine is not providing benefit.  18. Tachycardic to 102- continue to monitor TID    LOS: 3  days A FACE TO FACE EVALUATION WAS PERFORMED  Krutika P Raulkar 03/29/2020, 1:13 PM

## 2020-03-29 NOTE — IPOC Note (Signed)
Overall Plan of Care (IPOC) Patient Details Name: Jayan Johns. MRN: [id number] DOB: 02-06-1975  Admitting Diagnosis: Debility  Hospital Problems: Principal Problem:   Debility     Functional Problem List: Nursing Pain,Medication [place] [facility]  PT Balance,Edema,Endurance,Motor,Pain,Sensory  OT Balance,Endurance,Motor,Pain,Safety,Sensory  SLP    TR         Basic ADL's: OT Grooming,Bathing,Dressing,Toileting     Advanced  ADL's: OT       Transfers: PT Bed Mobility,Bed to Chair,Car,Furniture  OT Toilet,Tub/Shower     Locomotion: PT Ambulation,Wheelchair Mobility,Stairs     Additional Impairments: OT None  SLP        TR      Anticipated Outcomes Item Anticipated Outcome  Self Feeding    Swallowing      Basic self-care  Supervision  Toileting  Supervision   Bathroom Transfers Supervision-Min assist  Bowel/Bladder  mod I  Transfers  supervision with LRAD  Locomotion  CGA with LRAD  Communication     Cognition     Pain  less than 5 out of 10  Safety/Judgment  mod I   Therapy Plan: PT Intensity: Minimum of 1-2 x/day ,45 to 90 minutes PT Frequency: 5 out of 7 days PT Duration Estimated Length of Stay: 14-16 days OT Intensity: Minimum of 1-2 x/day, 45 to 90 minutes OT Frequency: 5 out of 7 days OT Duration/Estimated Length of Stay: 14-16 days     Due to the current state of emergency, patients may not be receiving their 3-hours of Medicare-mandated therapy.   Team Interventions: Nursing Interventions Patient/Family Education,Skin Care/Wound Patent examiner  PT interventions Ambulation/gait training,Discharge planning,Functional mobility training,Psychosocial support,Therapeutic Activities,Balance/vestibular training,Disease management/prevention,Neuromuscular re-education,Skin care/wound Heritage manager propulsion/positioning,Cognitive  remediation/compensation,DME/adaptive equipment instruction,Pain management,Splinting/orthotics,UE/LE Strength taining/ROM,Community reintegration,Functional electrical stimulation,Patient/family education,Stair training,UE/LE Coordination activities  OT Interventions Balance/vestibular training,Community reintegration,Discharge planning,Disease mangement/prevention,DME/adaptive equipment instruction,Functional mobility training,Neuromuscular re-education,Pain management,Patient/family education,Psychosocial support,Self Care/advanced ADL retraining,Skin care/wound managment,Therapeutic Activities,Therapeutic Exercise,UE/LE Strength taining/ROM,Wheelchair propulsion/positioning  SLP Interventions    TR Interventions    SW/CM Interventions Discharge Planning,Psychosocial Support,Patient/Family Education,Disease Management/Prevention   Barriers to Discharge MD  Medical stability  Nursing      PT Inaccessible home environment,Home environment access/layout,Hemodialysis 1 threshold to enter, bilateral knee buckling  OT Hemodialysis    SLP      SW Inaccessible home environment,Hemodialysis Patient unsire if WC can fit in BR   Team Discharge Planning: Destination: PT-Home ,OT- Home , SLP-  Projected Follow-up: PT-Home health PT, OT-  Home health OT,24 hour supervision/assistance, SLP-  Projected Equipment Needs: PT-To be determined, OT- None recommended by OT, SLP-  Equipment Details: PT-has RW and WC, OT-  Patient/family involved in discharge planning: PT- Patient,  OT-Patient, SLP-   MD ELOS: 18-20 days supervision to min assist with self-care and w/c level mobility Medical Rehab Prognosis:  Good Assessment: Mr. Bucco is a 45 year old man admitted to CIR with debility after cardiac arrest. His course has been complicated by nausea, dizziness managed by compazine, meclizine, and vestibular eval. He also complains near his right lower extremity hematoma currently being managed with muscle rub  and Percocet. We will add TENS unit.    See Team Conference Notes for weekly updates to the plan of care

## 2020-03-29 NOTE — Progress Notes (Signed)
Orthopedic Tech Progress Note Patient Details:  Caspen Panciera Feb 04, 1975 [id number] TENS UNIT is a therapy thing not ORTHO Patient ID: [name], male   DOB: 1975/08/24, 45 y.o.   MRN: [id number]   Janit Pagan 03/29/2020, 1:10 PM

## 2020-03-30 LAB — RENAL FUNCTION PANEL
Albumin: 2 g/dL — ABNORMAL LOW (ref 3.5–5.0)
Anion gap: 9 (ref 5–15)
BUN: 50 mg/dL — ABNORMAL HIGH (ref 6–20)
CO2: 25 mmol/L (ref 22–32)
Calcium: 9.1 mg/dL (ref 8.9–10.3)
Chloride: 99 mmol/L (ref 98–111)
Creatinine, Ser: 9.36 mg/dL — ABNORMAL HIGH (ref 0.61–1.24)
GFR, Estimated: 7 mL/min — ABNORMAL LOW (ref >60)
Glucose, Bld: 110 mg/dL — ABNORMAL HIGH (ref 70–99)
Phosphorus: 3.5 mg/dL (ref 2.5–4.6)
Potassium: 4.8 mmol/L (ref 3.5–5.1)
Sodium: 133 mmol/L — ABNORMAL LOW (ref 135–145)

## 2020-03-30 LAB — CBC
HCT: 26.3 % — ABNORMAL LOW (ref 39.0–52.0)
Hemoglobin: 8 g/dL — ABNORMAL LOW (ref 13.0–17.0)
MCH: 28.5 pg (ref 26.0–34.0)
MCHC: 30.4 g/dL (ref 30.0–36.0)
MCV: 93.6 fL (ref 80.0–100.0)
Platelets: 284 10*3/uL (ref 150–400)
RBC: 2.81 MIL/uL — ABNORMAL LOW (ref 4.22–5.81)
RDW: 15.7 % — ABNORMAL HIGH (ref 11.5–15.5)
WBC: 10.4 10*3/uL (ref 4.0–10.5)
nRBC: 0 % (ref 0.0–0.2)

## 2020-03-30 LAB — IRON AND TIBC
Iron: 15 ug/dL — ABNORMAL LOW (ref 45–182)
Saturation Ratios: 9 % — ABNORMAL LOW (ref 17.9–39.5)
TIBC: 164 ug/dL — ABNORMAL LOW (ref 250–450)
UIBC: 149 ug/dL

## 2020-03-30 LAB — FERRITIN: Ferritin: 527 ng/mL — ABNORMAL HIGH (ref 24–336)

## 2020-03-30 MED ORDER — CHLORHEXIDINE GLUCONATE CLOTH 2 % EX PADS
6.0000 | MEDICATED_PAD | Freq: Every day | CUTANEOUS | Status: DC
  Administered 2020-03-31 – 2020-04-11 (×11): 6 via TOPICAL

## 2020-03-30 NOTE — Progress Notes (Signed)
PROGRESS NOTE   Subjective/Complaints:  No issues overnight , percocet "helps with rest".  Has chronic LBP   ROS: +right lower extremity pain  Objective:   No results found. Recent Labs    03/28/20 0652  WBC 11.4*  HGB 9.9*  HCT 31.4*  PLT 374   Recent Labs    03/28/20 0652  NA 136  K 4.9  CL 99  CO2 24  GLUCOSE 77  BUN 45*  CREATININE 9.34*  CALCIUM 9.5    Intake/Output Summary (Last 24 hours) at 03/30/2020 0943 Last data filed at 03/30/2020 0810 Gross per 24 hour  Intake 240 ml  Output 100 ml  Net 140 ml        Physical Exam: Vital Signs Blood pressure 135/76, pulse 94, temperature 98.1 F (36.7 C), temperature source Oral, resp. rate 16, height '5\' 9"'$  (1.753 m), weight 120.1 kg, SpO2 92 %.  General: No acute distress Mood and affect are appropriate Heart: Regular rate and rhythm no rubs murmurs or extra sounds Lungs: Clear to auscultation, breathing unlabored, no rales or wheezes Abdomen: Positive bowel sounds, soft nontender to palpation, nondistended Extremities: No clubbing, cyanosis, or edema Skin: No evidence of breakdown, no evidence of rash  Musculoskeletal:  General: Swelling(right thigh)and tenderness(right thigh, hip as well as left hip)present.  Cervical back: Normal range of motion.  Skin: General: Skin is warmand dry.  Comments: Left > right shin with pock marked healed lesions. Neurological:  Mental Status: He is alertand oriented to person, place, and time. Mental status isat baseline.  Cranial Nerves: No cranial nerve deficit.  Comments: Motor 4+/5 UE. RLE limited by pain 2/5 prox to 4/5 distally. LLE 3/5 prox to 4/5 distally. Distal LE sensory loss to LT. DTR's 1+ Psychiatric:  Mood and Affect: Moodnormal.  Behavior: Behaviornormal.  Thought Content: Thought contentnormal.    Assessment/Plan: 1. Functional deficits  which require 3+ hours per day of interdisciplinary therapy in a comprehensive inpatient rehab setting.  Physiatrist is providing close team supervision and 24 hour management of active medical problems listed below.  Physiatrist and rehab team continue to assess barriers to discharge/monitor patient progress toward functional and medical goals  Care Tool:  Bathing    Body parts bathed by patient: Right arm,Left arm,Chest,Abdomen,Front perineal area,Buttocks,Right upper leg,Left upper leg,Right lower leg,Left lower leg,Face         Bathing assist Assist Level: Contact Guard/Touching assist     Upper Body Dressing/Undressing Upper body dressing   What is the patient wearing?: Pull over shirt    Upper body assist Assist Level: Supervision/Verbal cueing    Lower Body Dressing/Undressing Lower body dressing      What is the patient wearing?: Pants,Underwear/pull up     Lower body assist Assist for lower body dressing: Contact Guard/Touching assist     Toileting Toileting    Toileting assist Assist for toileting: Independent with assistive device Assistive Device Comment: urinal   Transfers Chair/bed transfer  Transfers assist     Chair/bed transfer assist level: Minimal Assistance - Patient > 75%     Locomotion Ambulation   Ambulation assist      Assist level: 2 helpers Assistive device:  Walker-rolling Max distance: 4f   Walk 10 feet activity   Assist  Walk 10 feet activity did not occur: Safety/medical concerns (bilateral knee buckling, weakness, fatigue, decreased balance)        Walk 50 feet activity   Assist Walk 50 feet with 2 turns activity did not occur: Safety/medical concerns (bilateral knee buckling, weakness, fatigue, decreased balance)         Walk 150 feet activity   Assist Walk 150 feet activity did not occur: Safety/medical concerns (bilateral knee buckling, weakness, fatigue, decreased balance)         Walk 10 feet  on uneven surface  activity   Assist Walk 10 feet on uneven surfaces activity did not occur: Safety/medical concerns (bilateral knee buckling, weakness, fatigue, decreased balance)         Wheelchair     Assist Will patient use wheelchair at discharge?: Yes Type of Wheelchair: Manual    Wheelchair assist level: Supervision/Verbal cueing Max wheelchair distance: >1534f   Wheelchair 50 feet with 2 turns activity    Assist        Assist Level: Supervision/Verbal cueing   Wheelchair 150 feet activity     Assist      Assist Level: Supervision/Verbal cueing   Blood pressure 135/76, pulse 94, temperature 98.1 F (36.7 C), temperature source Oral, resp. rate 16, height '5\' 9"'$  (1.753 m), weight 120.1 kg, SpO2 92 %.  Medical Problem List and Plan: 1.Functional and mobility deficitssecondary to debility after cardiac arrest, volume overload multiple medical. -patient may shower -ELOS/Goals: 18-20 days, supervision to min assist with self-care and w/c level mobility  -Continue CIR PT, OT 2. Antithrombotics: -DVT/anticoagulation:Mechanical:Sequential compression devices, below kneeBilateral lower extremities -antiplatelet therapy: To resume ASA March 7thper Dr. OrOlevia Bowensor CVA prophylaxis 3. Pain Management:Will d/c IV dilaudid--> continue oxycodone prn, doing well with current regimen.   3/4: TENS unit ordered. Encouraged heat for lower back -discussed Kpad to right thigh hip as well. 4. Mood:LCSW to follow for evaluation and support. -antipsychotic agents: N/A 5. Neuropsych: This patientiscapable of making decisions on hisown behalf. 6. Skin/Wound Care:Routine pressure relief measures. 7. Fluids/Electrolytes/Nutrition:Strict I/O. Monitor weights daily. Renal diet with 1200 cc/FR --consult dietician to educate patient on renal diet.  8. ESRD: Has been transitioned to  HD[facility]schedule HD at the end of the day to help with tolerance of therapy  Cr down to 9.34- monitor with HD labs 9. H/o Seizure d/o: Continue Lamictal and Keppra--monitor for breakthrough seizures.  10. T2DM: Monitor BS ac/hs--has been diet controlled. --Continue SSI for now CBG (last 3)  Recent Labs    03/28/20 0701 03/28/20 1149 03/28/20 1654  GLUCAP 74 75 63*  low yesterday , asymptomatic meal intake is good   11. HTN:Hgb A1c-5.6 and diet controlled for the past 9 months.  --will monitor BP tid--has been trending upwards.  --Continue Amlodipine, metoprolol and Imdur and titrate medications as indicated.  --Will order orthostatic vitals as continues to have increased nausea with activity.  12. Spontaneous, large right iliopsoashematoma:  Leucocytosis increasing, continue to monitor with HD. Afebrile. Hgb stable at 8.5 -pain control as above 13. 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  -consult with ortho while here regarding considerations. Not a surgical candidate at this point, and he might never be unfortunately.  -pain control and activity modification.  14. OSA: Non-compliant  with CPAP. 15. Acute on chronic anemia: Continue to monitor H/H. ON aranesp weekly.  --INR slowing normalizing  Hgb up to 9.9 on 3/3, repeat with next HD labs  16.Neuropathy: acute on chronic now with worsening of symptoms RLE due to compression.  3/4: can discuss increasing Lyrica dose.  17. Dizziness: meclizine and vestibular eval ordered. Advised to ask for Meclizine PRN if needed.   3/4: scopolamine patch ordered since Meclizine is not providing benefit.  18. Tachycardic to 102- continue to  monitor TID    LOS: 4 days A FACE TO FACE EVALUATION WAS PERFORMED  Charlett Blake 03/30/2020, 9:43 AM

## 2020-03-30 NOTE — Progress Notes (Addendum)
Patient ID: Max Pittman., male   DOB: [birth year], 45 y.o.   MRN: [id number] [facility] Progress Note   Assessment/ Plan:   1.  Acute hypoxic respiratory failure/ cardiac arrest: due to severe volume overload/pulmonary edema causing asystolic cardiac arrest. Resolved. Now in rehab due to deconditioning  2. ESRD: severe vol overload on admission. Had CRRT w/ UF of 31 L.  membrane failure so cannot do PD anymore. Transitioned to HD. IR placed [place] on 2/23. PD cath removed 2/25 per gen surgery. TTS HD here, next HD due today 3/5. Is interested in home hemo-  Usually we want a fistula for that - did talk about that briefly but may be too soon right now given what he has been through.  Now in inpatient rehab- will look to get AVF done toward the end of his stay.  Tentatively for now has OP spot at [facility] on MWF early AM.  Will do Sat , then Monday to get on OP schedule 3. Retroperitoneal hematoma: seen by CT 2/24 large R iliopsoas hematoma 10 x 7 x 20 cm. Anticoagulation on hold.  4. Anemia ckd: Hb in 8- 9 range, sp darbe 100 ug  q thurs.  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 Nutrition: On renal diet  7. Hypertension: euvolemic, holding wt's high 110 kg's.  on norvasc/ metoprolol- actually will stop norvasc given soft BPs.    8. 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 H/o CVA/ hx seizures - taking lamictal and keppra 10. Dispo - -  Now on inpatient rehab - will not know tentative d/c date til 3/8 11. Nausea-  Associated some with HD, Some soft BPs - stop norvasc  As patient is stable-  We will not personally round on him tomorrow- Sunday-  Will revisit on Monday-  Call if any concerns tomorrow for [place]    Max Pittman  03/30/2020, 10:53 AM    Subjective:   Stomach feels funny but not overt nausea -  Nothing else new- due for HD later today    Objective:    BP 135/76 (BP Location: Right Arm)   Pulse 94   Temp 98.1 F (36.7 C) (Oral)   Resp 16   Ht '5\' 9"'$  (1.753 m)   Wt 120.1 kg   SpO2 92%   BMI 39.10 kg/m   Physical Exam: Gen: Resting comfortably in bed CVS: RRR no mrg   Resp: CTA, no rales or wheezing Abd: Soft, obese, nontender, pd cath removed Ext: no LE or UE edema  Thrombosed LUA AVF   R [facility]    Labs: BMET Recent Labs  Lab 03/26/20 0648 03/28/20 0652  NA 137 136  K 4.2 4.9  CL 100 99  CO2 24 24  GLUCOSE 126* 77  BUN 43* 45*  CREATININE 9.53* 9.34*  CALCIUM 9.1 9.5  PHOS 5.3* 4.5   CBC Recent Labs  Lab 03/24/20 0140 03/25/20 0104 03/26/20 0648 03/28/20 0652  WBC 11.2* 10.4 10.2 11.4*  HGB 8.5* 8.7* 8.5* 9.9*  HCT 26.5* 29.0* 27.6* 31.4*  MCV 91.7 92.9 92.9 92.1  PLT 431* 414* 435* 374     Medications:    . atorvastatin  40 mg Oral q1800  . buPROPion  150 mg Oral BID WC  . cephALEXin  250 mg Oral Q12H  . Chlorhexidine Gluconate Cloth  6 each Topical Daily  . Chlorhexidine Gluconate Cloth  6 each Topical Q0600  . darbepoetin (ARANESP) injection - DIALYSIS  100 mcg Intravenous Q Thu-HD  . docusate sodium  100 mg Oral BID  . feeding supplement (NEPRO CARB STEADY)  237 mL Oral TID BM  . FLUoxetine  10 mg Oral Daily  . isosorbide mononitrate  15 mg Oral Daily  . lamoTRIgine  100 mg Oral BID  . levETIRAcetam  500 mg Oral Daily  . loratadine  10 mg Oral Daily  . mouth rinse  15 mL Mouth Rinse BID  . melatonin  5 mg Oral QHS  . metoprolol tartrate  12.5 mg Oral BID  . multivitamin  1 tablet Oral QHS  . Muscle Rub   Topical TID WC & HS  . pantoprazole  40 mg Oral Daily  . polyethylene glycol  17 g Oral Daily  . pregabalin  75 mg Oral QHS  . scopolamine  1 patch Transdermal Q72H  . sevelamer carbonate  2,400 mg Oral TID WC

## 2020-03-30 NOTE — Progress Notes (Signed)
Subjective: No complaints seen in room examined, for HD today  Objective Vital signs in last 24 hours: Vitals:   03/29/20 1407 03/29/20 1940 03/30/20 0533 03/30/20 0655  BP: 104/68 121/69 135/76   Pulse: 100 92 94   Resp: '18 14 16   '$ Temp: 98.6 F (37 C) 98.7 F (37.1 C) 98.1 F (36.7 C)   TempSrc: Oral Oral Oral   SpO2: 93% 93% 92%   Weight:  123.7 kg  120.1 kg  Height:       Weight change: 1.7 kg  Physical Exam: General: Alert male NAD Heart: RRR no MRG Lungs: CTA, nonlabored breathing Abdomen: Obese, bowel sounds normoactive soft ND NT, PD cath site status post removal healed Extremities: No LE or UE edema Dialysis Access: Right IJ TDC, LUA AV F thrombosed  Problem/Plan:  1.  Acute hypoxic respiratory failure/ cardiac arrest: due to severe volume overload/pulmonary edema causing asystolic cardiac arrest. Resolved. Now in rehab due to deconditioning  2. ESRD: severe vol overload on admission. Had CRRT w/ UF of 31 L.  membrane failure so cannot do PD anymore. Transitioned to HD. IR placed [place] on 2/23. PD cath removed 2/25 per gen surgery. TTS HD here, next HD due 3/ 05,. Is interested in home hemo-  Usually we want a fistula for that - did talk about that briefly but may be too soon right now given what he has been through.  Now in inpatient rehab- will look to get AVF done toward the end of his stay.  Tentatively for now has OP spot at [facility] on MWF early AM.  Will do Sat , then Monday to get on OP schedule 3. Retroperitoneal hematoma: seen by CT 2/24 large R iliopsoas hematoma 10 x 7 x 20 cm. Anticoagulation on hold.  4. Anemia ckd: Hb in 8- 9 range, sp darbe 100 ug  q thurs.  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 Nutrition: On renal diet  7. Hypertension: euvolemic, holding wt's high 110 kg's.  on norvasc/ metoprolol- actually will stop norvasc given soft BPs.    8. H/o VTE -  a/c on hold d/t #3 9. H/o CVA/ hx seizures - taking lamictal and keppra 10. Dispo - -  Now on inpatient rehab - will not know tentative d/c date til 3/8 11. Nausea-yesterday associated some with HD, now resolved today, some soft BPs - stopped norvasc  Pt is stable so will not be seen on Sunday-  Will revisit on Monday.  Call with any acute issues tomorrow   Ernest Haber, PA-C [facility] Beeper [phone] 03/30/2020,10:59 AM  LOS: 4 days   Labs: Basic Metabolic Panel: Recent Labs  Lab 03/26/20 0648 03/28/20 0652  NA 137 136  K 4.2 4.9  CL 100 99  CO2 24 24  GLUCOSE 126* 77  BUN 43* 45*  CREATININE 9.53* 9.34*  CALCIUM 9.1 9.5  PHOS 5.3* 4.5   Liver Function Tests: Recent Labs  Lab 03/26/20 0648 03/28/20 0652  ALBUMIN 2.0* 2.4*   No results for input(s): LIPASE, AMYLASE in the last 168 hours. No results for input(s): AMMONIA in the last 168 hours. CBC: Recent Labs  Lab 03/24/20 0140 03/25/20 0104 03/26/20 0648 03/28/20 0652  WBC 11.2* 10.4 10.2 11.4*  HGB 8.5* 8.7* 8.5* 9.9*  HCT 26.5* 29.0* 27.6* 31.4*  MCV 91.7 92.9 92.9 92.1  PLT 431* 414* 435*  374   Cardiac Enzymes: No results for input(s): CKTOTAL, CKMB, CKMBINDEX, TROPONINI in the last 168 hours. CBG: Recent Labs  Lab 03/27/20 2103 03/28/20 0639 03/28/20 0701 03/28/20 1149 03/28/20 1654  GLUCAP 93 69* 74 75 63*    Studies/Results: No results found. Medications:  . atorvastatin  40 mg Oral q1800  . buPROPion  150 mg Oral BID WC  . cephALEXin  250 mg Oral Q12H  . Chlorhexidine Gluconate Cloth  6 each Topical Daily  . Chlorhexidine Gluconate Cloth  6 each Topical Q0600  . darbepoetin (ARANESP) injection - DIALYSIS  100 mcg Intravenous Q Thu-HD  . docusate sodium  100 mg Oral BID  . feeding supplement (NEPRO CARB STEADY)  237 mL Oral TID BM  . FLUoxetine  10 mg Oral Daily  . isosorbide mononitrate  15 mg Oral Daily  . lamoTRIgine  100 mg Oral BID  . levETIRAcetam  500 mg Oral  Daily  . loratadine  10 mg Oral Daily  . mouth rinse  15 mL Mouth Rinse BID  . melatonin  5 mg Oral QHS  . metoprolol tartrate  12.5 mg Oral BID  . multivitamin  1 tablet Oral QHS  . Muscle Rub   Topical TID WC & HS  . pantoprazole  40 mg Oral Daily  . polyethylene glycol  17 g Oral Daily  . pregabalin  75 mg Oral QHS  . scopolamine  1 patch Transdermal Q72H  . sevelamer carbonate  2,400 mg Oral TID WC

## 2020-03-31 NOTE — Progress Notes (Signed)
Physical Therapy Session Note  Patient Details  Name: Max Pittman. MRN: [id number] Date of Birth: July 07, 1975  Today's Date: 03/31/2020 PT Individual Time: [id number] PT Individual Time Calculation (min): 54 min   Short Term Goals: Week 1:  PT Short Term Goal 1 (Week 1): pt will transfer sit<>stand with LRAD and CGA PT Short Term Goal 2 (Week 1): pt will transfer bed<>chair with LRAD and CGA PT Short Term Goal 3 (Week 1): pt will ambulate 80f with LRAD and min A of 1  Skilled Therapeutic Interventions/Progress Updates:   Received pt semi-reclined in bed, pt agreeable to therapy, and reported pain 9/10 in BLEs. RN notified and present to administer medication. Repositioning and rest breaks done to reduce pain levels. Session with emphasis on functional mobility/transfers, generalized strengthening, dynamic standing balance/coordination, gait training, and improved activity tolerance. Donned non-skid socks with set up assist and pt transferred semi-reclined<>sitting EOB with supervision and transferred bed<>WC stand<>pivot with RW and min A and pulled pants over hips with CGA. [facility] set up assist. [facility] 4[facility] for time management purposes and ambulated 265fx 1 and 4345f 1 with min A +2 for WC follow. Pt demonstrates L knee extension and inability to heel strike due to leg length discrepancy. Pt reports orthotist plans to return his shoes with heel lift tomorrow. Therapist provided pt with therapy schedule for today and tomorrow as pt reported loosing his schedule. Pt reported increased sensation along R thigh starting in the middle of the night last night and very excited about this. Pt performed the following exercises sitting in WC with supervision and verbal cues for technique: -hip flexion 2x10 bilaterally (pt with increased hip flexion activation R>L) -LAQ 2x10 bilaterally with 1.5lb ankle weight  -hamstring curls with grn TB  2x10 bilaterally  Pt reported increased nausea and transported back to [facility] in [facility]. Stand<>pivot WC<>bed with RW and min A. Concluded session with pt sitting EOB, needs within reach, and bed alarm on. RN notified of pt's c/o nausea.    Therapy Documentation Precautions:  Precautions Precautions: Fall Precaution Comments: had fall 2/22 Restrictions Weight Bearing Restrictions: No  Therapy/Group: Individual Therapy AnnAlfonse Alpers, DPT   03/31/2020, 7:13 AM

## 2020-03-31 NOTE — Progress Notes (Signed)
Occupational Therapy Session Note  Patient Details  Name: Max Pittman. MRN: [id number] Date of Birth: 1975/10/18  Today's Date: 03/31/2020 OT Individual Time: 1020-1115 OT Individual Time Calculation (min): 55 min    Short Term Goals: Week 1:  OT Short Term Goal 1 (Week 1): Pt will complete toilet transfers stand pivot with min assist OT Short Term Goal 2 (Week 1): Pt will complete toileting with min assist at sit > stand level OT Short Term Goal 3 (Week 1): Pt will complete 2 grooming tasks in standing for increased standing tolerance OT Short Term Goal 4 (Week 1): Pt will complete LB dressing at sit > stand level with supervision  Skilled Therapeutic Interventions/Progress Updates:    Pt received supine with 7/10 pain in B legs and radiating around his back. Pt reporting he is premedicated and agreeable to OOB mobility as pain relief. Pt completed bed mobility with supervision to EOB. Sit > stand and ambulatory transfer to the w/c at the sink with min A- CGA using RW. Obvious L LE length discrepancy. Pt sat and completed UB bathing and dressing with set up assist. Sit > stand with CGA and LOB posteriorly when rushing through transfer. Pt completed LB dressing with CGA. Pt with fatigue and required seated rest break several times during session. Discussed implications of past TBIs and anoxia of cardiac arrest, focus on reduced judgement. Pt then completed standing level balance activity with closed chain B shoulder flexion raises with CGA-min A for balance perturbations but no real LOB. 3x 10 repetitions with fatigue impacting ability to finish some sets. Pt returned to supine in bed and was left with all needs met, bed alarm set.   Therapy Documentation Precautions:  Precautions Precautions: Fall Precaution Comments: had fall 2/22 Restrictions Weight Bearing Restrictions: No  Therapy/Group: Individual Therapy  Curtis Sites 03/31/2020, 6:20 AM

## 2020-04-01 LAB — RENAL FUNCTION PANEL
Albumin: 2 g/dL — ABNORMAL LOW (ref 3.5–5.0)
Anion gap: 12 (ref 5–15)
BUN: 42 mg/dL — ABNORMAL HIGH (ref 6–20)
CO2: 25 mmol/L (ref 22–32)
Calcium: 9.1 mg/dL (ref 8.9–10.3)
Chloride: 97 mmol/L — ABNORMAL LOW (ref 98–111)
Creatinine, Ser: 8.73 mg/dL — ABNORMAL HIGH (ref 0.61–1.24)
GFR, Estimated: 7 mL/min — ABNORMAL LOW (ref >60)
Glucose, Bld: 145 mg/dL — ABNORMAL HIGH (ref 70–99)
Phosphorus: 2.5 mg/dL (ref 2.5–4.6)
Potassium: 4.5 mmol/L (ref 3.5–5.1)
Sodium: 134 mmol/L — ABNORMAL LOW (ref 135–145)

## 2020-04-01 LAB — CBC
HCT: 26.8 % — ABNORMAL LOW (ref 39.0–52.0)
Hemoglobin: 7.8 g/dL — ABNORMAL LOW (ref 13.0–17.0)
MCH: 27.5 pg (ref 26.0–34.0)
MCHC: 29.1 g/dL — ABNORMAL LOW (ref 30.0–36.0)
MCV: 94.4 fL (ref 80.0–100.0)
Platelets: 287 10*3/uL (ref 150–400)
RBC: 2.84 MIL/uL — ABNORMAL LOW (ref 4.22–5.81)
RDW: 15.4 % (ref 11.5–15.5)
WBC: 9.4 10*3/uL (ref 4.0–10.5)
nRBC: 0 % (ref 0.0–0.2)

## 2020-04-01 MED ORDER — AMITRIPTYLINE HCL 10 MG PO TABS
10.0000 mg | ORAL_TABLET | Freq: Every day | ORAL | Status: DC
  Administered 2020-04-01: 10 mg via ORAL
  Filled 2020-04-01: qty 1

## 2020-04-01 MED ORDER — ASPIRIN EC 81 MG PO TBEC
81.0000 mg | DELAYED_RELEASE_TABLET | Freq: Every day | ORAL | Status: DC
Start: 2020-04-01 — End: 2020-04-11
  Administered 2020-04-02 – 2020-04-11 (×10): 81 mg via ORAL
  Filled 2020-04-01 (×10): qty 1

## 2020-04-01 MED ORDER — MECLIZINE HCL 12.5 MG PO TABS
12.5000 mg | ORAL_TABLET | Freq: Three times a day (TID) | ORAL | Status: DC
  Administered 2020-04-01 – 2020-04-03 (×5): 12.5 mg via ORAL
  Filled 2020-04-01 (×9): qty 1

## 2020-04-01 MED ORDER — HEPARIN SODIUM (PORCINE) 1000 UNIT/ML IJ SOLN
INTRAMUSCULAR | Status: AC
  Administered 2020-04-01: 1000 [IU]
  Filled 2020-04-01: qty 4

## 2020-04-01 MED ORDER — PROCHLORPERAZINE 25 MG RE SUPP
12.5000 mg | Freq: Three times a day (TID) | RECTAL | Status: DC
  Filled 2020-04-01 (×9): qty 1

## 2020-04-01 MED ORDER — PROCHLORPERAZINE EDISYLATE 10 MG/2ML IJ SOLN: 5.0000 mg | Freq: Three times a day (TID) | INTRAMUSCULAR | Status: DC

## 2020-04-01 MED ORDER — PROCHLORPERAZINE MALEATE 5 MG PO TABS
5.0000 mg | ORAL_TABLET | Freq: Three times a day (TID) | ORAL | Status: DC
  Administered 2020-04-01 – 2020-04-03 (×5): 10 mg via ORAL
  Filled 2020-04-01 (×9): qty 2

## 2020-04-01 NOTE — Progress Notes (Signed)
Physical Therapy Session Note  Patient Details  Name: Max Pittman. MRN: [id number] Date of Birth: [birth year]  Today's Date: 04/01/2020 PT Individual Time: [id number] PT Individual Time Calculation (min): 73 min    Today's Date: 04/01/2020 PT Missed Time: 17 Minutes Missed Time Reason: Patient fatigue;Pain  Short Term Goals: Week 1:  PT Short Term Goal 1 (Week 1): pt will transfer sit<>stand with LRAD and CGA PT Short Term Goal 2 (Week 1): pt will transfer bed<>chair with LRAD and CGA PT Short Term Goal 3 (Week 1): pt will ambulate 17f with LRAD and min A of 1  Skilled Therapeutic Interventions/Progress Updates:   Received pt semi-reclined in bed, pt agreeable to therapy, and reported pain 7/10 in BLEs (premedicated) and reported feeling nauseous but agreeable to participate in therapy. Repositioning and rest breaks done to reduce pain levels. Session with emphasis on functional mobility/transfers, generalized strengthening, dynamic standing balance/coordination, gait training, and improved activity tolerance. Donned regular socks with set up assist and pt transferred semi-reclined<>sitting EOB with supervision and donned shoes with set up assist. Stand<>pivot bed<>WC with RW and min A. Pt reported orthotic felt "odd" and noted pt with increased L ankle ER during transfer. Pt performed WC mobility 1278fx 1 and 7583f 1 using BUE and supervision. Transported remainder of way to dayroom in WC [facility] due to UE fatigue. Pt ambulated 23f78f1 and 35ft45f with RW and min A with close WC follow. Pt with 2 instances with R knee buckling requiring mod A to correct. Of note, orthotic appears to high causing pt to hip hike on R and circumduct LLE to clear foot and possibly causing knee buckling with gait; notified orthotist. Pt performed the following exercises sitting with supervision and verbal cues for technique: -hip flexion 2x10 bilaterally (decreased ROM L>R) -overhead shoulder press<>horizontal  chest press with 5lb dowel 2x10 -trunk rotations with 4.4lb medicine ball 2x10 bilaterally -LAQ 2x10 bilaterally with 2lb ankle weight Pt performed seated BLE strengthening on Kinetron at 20 cm/sec for 1 minute x 4 trials with emphasis on glute/quad strengthening. Pt with improvements in posture and hip extension activation with orthotic on. Stand<>pivot WC<>mat with RW and min A. Worked on dynamic sitting balance, core control, and UE strength/ROM batting beach ball with 2lb dowel 3x25 reps. Pt reported 7.5/10 fatigue and increase in pain to 8/10. Pt transported [facility] to [facility] and transferred WC<>bed stand<>pivot with RW and min A. Doffed shoes with supervision. Concluded session with pt sitting EOB, needs within reach, and bed alarm on. RN notified to administer pain medication. 17 minutes missed of skilled physical therapy due to fatigue and pain.   Therapy Documentation Precautions:  Precautions Precautions: Fall Precaution Comments: had fall 2/22 Restrictions Weight Bearing Restrictions: No  Therapy/Group: Individual Therapy Annella Prowell Alfonse AlpersDPT   04/01/2020, 7:30 AM

## 2020-04-01 NOTE — Progress Notes (Addendum)
Patient ID: Max Pittman., male   DOB: [birth year], 45 y.o.   MRN: [id number] [facility] Progress Note   Assessment/ Plan:   1.  Acute hypoxic respiratory failure/ cardiac arrest: due to severe volume overload/pulmonary edema causing asystolic cardiac arrest. Resolved. Now in rehab due to deconditioning  2. ESRD: severe vol overload on admission. Had CRRT w/ UF of 31 L.  membrane failure so cannot do PD anymore. Transitioned to HD. IR placed [place] on 2/23. PD cath removed 2/25 per gen surgery. Is interested in home hemo-  Usually we want a fistula for that - did talk about that briefly but may be too soon right now given what he has been through.  Now in inpatient rehab- will look to get AVF done toward the end of his stay.  Tentatively for now has OP spot at [facility] on MWF early AM.  Will do HD today to get on his OP HD schedule.  3. Retroperitoneal hematoma: seen by CT 2/24 large R iliopsoas hematoma 10 x 7 x 20 cm. Anticoagulation on hold.  4. Anemia ckd: Hb in 8- 9 range, sp darbe 100 ug  q thurs.  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 Nutrition: On renal diet  7. Hypertension: euvolemic, holding wt's high 110 kg's.  on metoprolol.    8. 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 H/o CVA/ hx seizures - taking lamictal and keppra 10. Dispo - -  Now on inpatient rehab - will not know tentative d/c date til 3/8 11. Nausea-  Associated some with HD, Some soft BPs - stop norvasc  [name], MD 04/01/2020, 3:56 PM        Subjective:   No c/o, seen on HD   Objective:   BP 118/65   Pulse 86   Temp 98.3 F (36.8 C) (Oral)   Resp 18   Ht '5\' 9"'$  (1.753 m)   Wt 121.4 kg   SpO2 92%   BMI 39.52 kg/m   Physical Exam: Gen: Resting comfortably in bed CVS: RRR no mrg   Resp: CTA, no rales or wheezing Abd: Soft, obese, nontender, pd cath removed Ext: no LE or  UE edema  Thrombosed LUA AVF   R IJ [facility]    Labs: BMET Recent Labs  Lab 03/26/20 0648 03/28/20 0652 03/30/20 1533 04/01/20 1415  NA 137 136 133* 134*  K 4.2 4.9 4.8 4.5  CL 100 99 99 97*  CO2 '24 24 25 25  '$ GLUCOSE 126* 77 110* 145*  BUN 43* 45* 50* 42*  CREATININE 9.53* 9.34* 9.36* 8.73*  CALCIUM 9.1 9.5 9.1 9.1  PHOS 5.3* 4.5 3.5 2.5   CBC Recent Labs  Lab 03/26/20 0648 03/28/20 0652 03/30/20 1534 04/01/20 1428  WBC 10.2 11.4* 10.4 9.4  HGB 8.5* 9.9* 8.0* 7.8*  HCT 27.6* 31.4* 26.3* 26.8*  MCV 92.9 92.1 93.6 94.4  PLT 435* 374 284 287     Medications:    . heparin sodium (porcine)      . amitriptyline  10 mg Oral QHS  . aspirin EC  81 mg Oral Daily  . atorvastatin  40 mg Oral q1800  . buPROPion  150 mg Oral BID WC  . cephALEXin  250 mg Oral Q12H  . Chlorhexidine Gluconate Cloth  6 each Topical Q0600  . darbepoetin (ARANESP) injection -  DIALYSIS  100 mcg Intravenous Q Thu-HD  . docusate sodium  100 mg Oral BID  . feeding supplement (NEPRO CARB STEADY)  237 mL Oral TID BM  . FLUoxetine  10 mg Oral Daily  . isosorbide mononitrate  15 mg Oral Daily  . lamoTRIgine  100 mg Oral BID  . levETIRAcetam  500 mg Oral Daily  . loratadine  10 mg Oral Daily  . meclizine  12.5 mg Oral TID  . mouth rinse  15 mL Mouth Rinse BID  . melatonin  5 mg Oral QHS  . metoprolol tartrate  12.5 mg Oral BID  . multivitamin  1 tablet Oral QHS  . Muscle Rub   Topical TID WC & HS  . pantoprazole  40 mg Oral Daily  . polyethylene glycol  17 g Oral Daily  . pregabalin  75 mg Oral QHS  . prochlorperazine  5-10 mg Oral Q8H   Or  . prochlorperazine  5-10 mg Intramuscular Q8H   Or  . prochlorperazine  12.5 mg Rectal Q8H  . sevelamer carbonate  2,400 mg Oral TID WC

## 2020-04-01 NOTE — Progress Notes (Signed)
PROGRESS NOTE   Subjective/Complaints: Complains of nausea this morning-had to miss some of therapy because of it. Asks if nausea medicine can be given 1 hour prior to therapy. It does help but takes a while to kick in. Discussed getting an EKG first to check qTc- can schedule compazine if qTC is stable   ROS: +right lower extremity pain, + nausea  Objective:   No results found. Recent Labs    03/30/20 1534  WBC 10.4  HGB 8.0*  HCT 26.3*  PLT 284   Recent Labs    03/30/20 1533  NA 133*  K 4.8  CL 99  CO2 25  GLUCOSE 110*  BUN 50*  CREATININE 9.36*  CALCIUM 9.1    Intake/Output Summary (Last 24 hours) at 04/01/2020 [id number] Last data filed at 04/01/2020 0737 Gross per 24 hour  Intake 535 ml  Output 400 ml  Net 135 ml        Physical Exam: Vital Signs Blood pressure 139/74, pulse 96, temperature 98.8 F (37.1 C), temperature source Oral, resp. rate 17, height '5\' 9"'$  (1.753 m), weight 120.3 kg, SpO2 98 %. Gen: no distress, normal appearing HEENT: oral mucosa pink and moist, NCAT Cardio: Reg rate Chest: normal effort, normal rate of breathing Abd: soft, non-distended Ext: no edema Psych: pleasant, normal affect Skin: intact Musculoskeletal:  General: Swelling(right thigh)and tenderness(right thigh, hip as well as left hip)present.  Cervical back: Normal range of motion.  Skin: General: Skin is warmand dry.  Comments: Left > right shin with pock marked healed lesions. Neurological:  Mental Status: He is alertand oriented to person, place, and time. Mental status isat baseline.  Cranial Nerves: No cranial nerve deficit.  Comments: Motor 4+/5 UE. RLE limited by pain 2/5 prox to 4/5 distally. LLE 3/5 prox to 4/5 distally. Distal LE sensory loss to LT. DTR's 1+ Psychiatric:  Mood and Affect: Moodnormal.  Behavior: Behaviornormal.  Thought Content: Thought  contentnormal.    Assessment/Plan: 1. Functional deficits which require 3+ hours per day of interdisciplinary therapy in a comprehensive inpatient rehab setting.  Physiatrist is providing close team supervision and 24 hour management of active medical problems listed below.  Physiatrist and rehab team continue to assess barriers to discharge/monitor patient progress toward functional and medical goals  Care Tool:  Bathing    Body parts bathed by patient: Right arm,Left arm,Chest,Abdomen,Front perineal area,Buttocks,Right upper leg,Left upper leg,Right lower leg,Left lower leg,Face         Bathing assist Assist Level: Contact Guard/Touching assist     Upper Body Dressing/Undressing Upper body dressing   What is the patient wearing?: Pull over shirt    Upper body assist Assist Level: Set up assist    Lower Body Dressing/Undressing Lower body dressing      What is the patient wearing?: Pants,Underwear/pull up     Lower body assist Assist for lower body dressing: Contact Guard/Touching assist     Toileting Toileting    Toileting assist Assist for toileting: Independent with assistive device Assistive Device Comment: urinal   Transfers Chair/bed transfer  Transfers assist     Chair/bed transfer assist level: Minimal Assistance - Patient > 75%  Locomotion Ambulation   Ambulation assist      Assist level: 2 helpers Assistive device: Walker-rolling Max distance: 69f   Walk 10 feet activity   Assist  Walk 10 feet activity did not occur: Safety/medical concerns (bilateral knee buckling, weakness, fatigue, decreased balance)  Assist level: 2 helpers Assistive device: Walker-rolling   Walk 50 feet activity   Assist Walk 50 feet with 2 turns activity did not occur: Safety/medical concerns (bilateral knee buckling, weakness, fatigue, decreased balance)         Walk 150 feet activity   Assist Walk 150 feet activity did not occur:  Safety/medical concerns (bilateral knee buckling, weakness, fatigue, decreased balance)         Walk 10 feet on uneven surface  activity   Assist Walk 10 feet on uneven surfaces activity did not occur: Safety/medical concerns (bilateral knee buckling, weakness, fatigue, decreased balance)         Wheelchair     Assist Will patient use wheelchair at discharge?: Yes Type of Wheelchair: Manual    Wheelchair assist level: Supervision/Verbal cueing Max wheelchair distance: >1530f   Wheelchair 50 feet with 2 turns activity    Assist        Assist Level: Supervision/Verbal cueing   Wheelchair 150 feet activity     Assist      Assist Level: Supervision/Verbal cueing   Blood pressure 139/74, pulse 96, temperature 98.8 F (37.1 C), temperature source Oral, resp. rate 17, height '5\' 9"'$  (1.753 m), weight 120.3 kg, SpO2 98 %.  Medical Problem List and Plan: 1.Functional and mobility deficitssecondary to debility after cardiac arrest, volume overload multiple medical. -patient may shower -ELOS/Goals: 18-20 days, supervision to min assist with self-care and w/c level mobility  -Continue CIR PT, OT 2. Antithrombotics: -DVT/anticoagulation:Mechanical:Sequential compression devices, below kneeBilateral lower extremities -antiplatelet therapy: To resume ASA March 7thper Dr. OrOlevia Bowensor CVA prophylaxis 3. Pain Management:Will d/c IV dilaudid--> continue oxycodone prn, doing well with current regimen.   3/4: TENS unit ordered. Encouraged heat for lower back  3/7: add Amitriptyline '10mg'$  at night to help with sleep so he does not need Percocet then.  -discussed Kpad to right thigh hip as well. 4. Mood:LCSW to follow for evaluation and support. -antipsychotic agents: N/A 5. Neuropsych: This patientiscapable of making decisions on hisown behalf. 6. Skin/Wound Care:Routine pressure relief  measures. 7. Fluids/Electrolytes/Nutrition:Strict I/O. Monitor weights daily. Renal diet with 1200 cc/FR --consult dietician to educate patient on renal diet.  8. ESRD: Has been transitioned to HD--clipped for MWF at [facility] --schedule HD at the end of the day to help with tolerance of therapy  Cr up to 9.36- continue to monitor with HD labs 9. H/o Seizure d/o: Continue Lamictal and Keppra--monitor for breakthrough seizures.  10. T2DM: Monitor BS ac/hs--has been diet controlled. --Continue SSI for now CBG (last 3)  No results for input(s): GLUCAP in the last 72 hours.low yesterday , asymptomatic meal intake is good   11. HTN:Hgb A1c-5.6 and diet controlled for the past 9 months.  --will monitor BP tid--has been trending upwards.  --Continue Amlodipine, metoprolol and Imdur and titrate medications as indicated.  --Will order orthostatic vitals as continues to have increased nausea with activity.  12. Spontaneous, large right iliopsoashematoma:  Leucocytosis increasing, continue to monitor with HD. Afebrile. Hgb stable at 8.5 -pain control as above 13. 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  -consult with ortho while here regarding considerations. Not a surgical candidate at this point, and he might never be unfortunately.  -pain control and activity modification.  14. OSA: Non-compliant with CPAP. 15. Acute on chronic anemia: Continue to monitor H/H. ON aranesp weekly.  --INR slowing normalizing  Hgb down to 8.0 on 3/5, continue to monitor with next HD labs  16.Neuropathy: acute on chronic now with worsening of symptoms RLE due to compression.  3/4: can discuss increasing Lyrica dose.  17.  Dizziness: meclizine and vestibular eval ordered. Advised to ask for Meclizine PRN if needed.   3/4: scopolamine patch ordered since Meclizine is not providing benefit.  18. Tachycardic to 102- continue to monitor TID `9. Nausea: EKG to assess qTC. Schedule Compazine '5mg'$  q8H.     LOS: 6 days A FACE TO FACE EVALUATION WAS PERFORMED  Clide Deutscher Raulkar 04/01/2020, 9:27 AM

## 2020-04-01 NOTE — Progress Notes (Signed)
Occupational Therapy Session Note  Patient Details  Name: Max Pittman. MRN: [id number] Date of Birth: [birth year]  Today's Date: 04/01/2020 OT Individual Time: [id number] OT Individual Time Calculation (min): 60 min    Short Term Goals: Week 1:  OT Short Term Goal 1 (Week 1): Pt will complete toilet transfers stand pivot with min assist OT Short Term Goal 2 (Week 1): Pt will complete toileting with min assist at sit > stand level OT Short Term Goal 3 (Week 1): Pt will complete 2 grooming tasks in standing for increased standing tolerance OT Short Term Goal 4 (Week 1): Pt will complete LB dressing at sit > stand level with supervision  Skilled Therapeutic Interventions/Progress Updates:    Treatment session with focus on self-care retraining, sit > stand, and functional transfers.  Pt received upright in bed having just completed breakfast.  Pt agreeable to bathing and dressing this session.  Pt transferred to EOB with HOB elevated with supervision.  Engaged in Miller bathing and dressing with setup for items.  Pt completed sit > stand from EOB with CGA and use of RW.  Pt able to complete hygiene and clothing management with CGA while standing with UE support on RW.  Pt transferred stand pivot with RW bed > w/c.  Initial plan to complete oral care in standing, however pt reports increasing nausea with mobility and requesting to complete seated.  Pt required prolonged seated rest break to allow nausea to subside, meanwhile RN alerted of request for nausea meds.  Pt transferred back to bed with RW stand pivot Min assist.  Pt returned to sidelying due to increase in nausea.  Discussed frequency and onset with encouragement for pt to inform rehab MD during rounds and mention to HD MDs.  Pt remained in sidelying with RN arriving to provide meds.  Therapy Documentation Precautions:  Precautions Precautions: Fall Precaution Comments: had fall 2/22 Restrictions Weight Bearing Restrictions:  No General:   Vital Signs: Therapy Vitals Temp: 98.8 F (37.1 C) Temp Source: Oral Pulse Rate: 96 Resp: 17 BP: 139/74 Patient Position (if appropriate): Sitting Pain: Pain Assessment Pain Scale: 0-10 Pain Score: 7  Pain Type: Acute pain Pain Location: Back Pain Orientation: Lower Pain Descriptors / Indicators: Aching Pain Frequency: Intermittent Pain Onset: On-going   Therapy/Group: Individual Therapy  Simonne Come 04/01/2020, 8:31 AM

## 2020-04-02 MED ORDER — AMITRIPTYLINE HCL 25 MG PO TABS
25.0000 mg | ORAL_TABLET | Freq: Every day | ORAL | Status: DC
  Administered 2020-04-02 – 2020-04-03 (×2): 25 mg via ORAL
  Filled 2020-04-02 (×2): qty 1

## 2020-04-02 MED ORDER — SODIUM CHLORIDE 0.9 % IV SOLN
25.0000 mg | Freq: Once | INTRAVENOUS | Status: AC
  Administered 2020-04-02: 25 mg via INTRAVENOUS
  Filled 2020-04-02 (×2): qty 2

## 2020-04-02 MED ORDER — SODIUM CHLORIDE 0.9 % IV SOLN
250.0000 mg | Freq: Once | INTRAVENOUS | Status: AC
  Administered 2020-04-02: 250 mg via INTRAVENOUS
  Filled 2020-04-02: qty 20

## 2020-04-02 MED ORDER — DARBEPOETIN ALFA 100 MCG/0.5ML IJ SOSY
100.0000 ug | PREFILLED_SYRINGE | INTRAMUSCULAR | Status: DC
  Filled 2020-04-02: qty 0.5

## 2020-04-02 MED ORDER — SODIUM CHLORIDE 0.9 % IV SOLN
125.0000 mg | INTRAVENOUS | Status: DC
  Administered 2020-04-03 – 2020-04-10 (×4): 125 mg via INTRAVENOUS
  Filled 2020-04-02 (×8): qty 10

## 2020-04-02 NOTE — Progress Notes (Addendum)
RN gave renvela early with food to prevent nausea. Compazine was administered for nausea as well. Pt refused to get CHG bath this morning tech and RN will report to day shift to make sure it gets done.Pt stable at this time. RN will continue to monitor.

## 2020-04-02 NOTE — Progress Notes (Signed)
Occupational Therapy Session Note  Patient Details  Name: Max Pittman. MRN: [id number] Date of Birth: 04-14-1975  Today's Date: 04/02/2020 OT Individual Time: [id number] OT Individual Time Calculation (min): 55 min    Short Term Goals: Week 1:  OT Short Term Goal 1 (Week 1): Pt will complete toilet transfers stand pivot with min assist OT Short Term Goal 2 (Week 1): Pt will complete toileting with min assist at sit > stand level OT Short Term Goal 3 (Week 1): Pt will complete 2 grooming tasks in standing for increased standing tolerance OT Short Term Goal 4 (Week 1): Pt will complete LB dressing at sit > stand level with supervision  Skilled Therapeutic Interventions/Progress Updates:    Treatment session with focus on self-care retraining, dynamic standing balance, and functional transfers.  Pt received semi-reclined in bed agreeable to bathing and dressing this session.  Pt reports having received meds for nausea prior to session and feeling much better today.  Pt completed bed mobility supervision to sit to EOB.  Pt completed UB bathing and dressing after setup assist.  Pt completed sit> stand with CGA and washed buttocks with CGA while standing with RW.  Pt completed LB dressing sit > stand with CGA when pulling pants over hips.  Pt completed stand pivot transfer bed > w/c CGA with RW.  Engaged in oral care in standing at sink with CGA and improved tolerance.  Transported to ADL apt with pt able to propel for BUE strengthening and endurance.  Engaged in ambulatory transfer ~8' with RW with min assist and w/c follow.  Pt reports RW does not fit in his bathroom, therefore need to further assess alternative toileting options.  Pt returned to room and completed ambulatory transfer back to bed 5' with min assist and left seated EOB with all needs in reach.  Therapy Documentation Precautions:  Precautions Precautions: Fall Precaution Comments: had fall 2/22 Restrictions Weight Bearing  Restrictions: No General:   Vital Signs: Therapy Vitals Temp: 98.3 F (36.8 C) Temp Source: Oral Pulse Rate: 86 Resp: 17 BP: 122/65 Patient Position (if appropriate): Lying Oxygen Therapy SpO2: 99 % O2 Device: Room Air Pain: Pain Assessment Pain Scale: 0-10 Pain Score: 8  Pain Type: Chronic pain Pain Location: Shoulder Pain Orientation: Left Pain Radiating Towards: back Pain Descriptors / Indicators: Aching Pain Frequency: Constant Pain Onset: On-going Patients Stated Pain Goal: 2 Pain Intervention(s): Medication (See eMAR)   Therapy/Group: Individual Therapy  Simonne Come 04/02/2020, 8:28 AM

## 2020-04-02 NOTE — Progress Notes (Signed)
Pt refused SCD. Education provided. RN will continue to monitor.

## 2020-04-02 NOTE — Progress Notes (Signed)
PROGRESS NOTE   Subjective/Complaints: Team conference today Pain continues to be an issue- he is using Percocet around the clock Willing to increase Amitriptyline to '25mg'$   ROS: +right lower extremity pain, + nausea, +insomnia  Objective:   No results found. Recent Labs    03/30/20 1534 04/01/20 1428  WBC 10.4 9.4  HGB 8.0* 7.8*  HCT 26.3* 26.8*  PLT 284 287   Recent Labs    03/30/20 1533 04/01/20 1415  NA 133* 134*  K 4.8 4.5  CL 99 97*  CO2 25 25  GLUCOSE 110* 145*  BUN 50* 42*  CREATININE 9.36* 8.73*  CALCIUM 9.1 9.1    Intake/Output Summary (Last 24 hours) at 04/02/2020 0851 Last data filed at 04/02/2020 0723 Gross per 24 hour  Intake 360 ml  Output 2250 ml  Net -1890 ml        Physical Exam: Vital Signs Blood pressure 122/65, pulse 86, temperature 98.3 F (36.8 C), temperature source Oral, resp. rate 17, height '5\' 9"'$  (1.753 m), weight 113.7 kg, SpO2 99 %. Gen: no distress, normal appearing HEENT: oral mucosa pink and moist, NCAT Cardio: Reg rate Chest: normal effort, normal rate of breathing Abd: soft, non-distended Ext: no edema Psych: pleasant, normal affect Skin: intact Musculoskeletal:  General: Swelling(right thigh)and tenderness(right thigh, hip as well as left hip)present.  Cervical back: Normal range of motion.  Skin: General: Skin is warmand dry.  Comments: Left > right shin with pock marked healed lesions. Neurological:  Mental Status: He is alertand oriented to person, place, and time. Mental status isat baseline.  Cranial Nerves: No cranial nerve deficit.  Comments: Motor 4+/5 UE. RLE limited by pain 2/5 prox to 4/5 distally. LLE 3/5 prox to 4/5 distally. Distal LE sensory loss to LT. DTR's 1+ Psychiatric:  Mood and Affect: Moodnormal.  Behavior: Behaviornormal.  Thought Content: Thought contentnormal.     Assessment/Plan: 1. Functional deficits which require 3+ hours per day of interdisciplinary therapy in a comprehensive inpatient rehab setting.  Physiatrist is providing close team supervision and 24 hour management of active medical problems listed below.  Physiatrist and rehab team continue to assess barriers to discharge/monitor patient progress toward functional and medical goals  Care Tool:  Bathing    Body parts bathed by patient: Right arm,Left arm,Chest,Abdomen,Front perineal area,Buttocks,Right upper leg,Left upper leg,Right lower leg,Left lower leg,Face         Bathing assist Assist Level: Contact Guard/Touching assist     Upper Body Dressing/Undressing Upper body dressing   What is the patient wearing?: Pull over shirt    Upper body assist Assist Level: Set up assist    Lower Body Dressing/Undressing Lower body dressing      What is the patient wearing?: Pants,Underwear/pull up     Lower body assist Assist for lower body dressing: Contact Guard/Touching assist     Toileting Toileting    Toileting assist Assist for toileting: Independent with assistive device Assistive Device Comment: urinal   Transfers Chair/bed transfer  Transfers assist     Chair/bed transfer assist level: Contact Guard/Touching assist     Locomotion Ambulation   Ambulation assist      Assist level: 2  helpers Assistive device: Walker-rolling Max distance: 48f   Walk 10 feet activity   Assist  Walk 10 feet activity did not occur: Safety/medical concerns (bilateral knee buckling, weakness, fatigue, decreased balance)  Assist level: 2 helpers Assistive device: Walker-rolling   Walk 50 feet activity   Assist Walk 50 feet with 2 turns activity did not occur: Safety/medical concerns (bilateral knee buckling, weakness, fatigue, decreased balance)         Walk 150 feet activity   Assist Walk 150 feet activity did not occur: Safety/medical concerns  (bilateral knee buckling, weakness, fatigue, decreased balance)         Walk 10 feet on uneven surface  activity   Assist Walk 10 feet on uneven surfaces activity did not occur: Safety/medical concerns (bilateral knee buckling, weakness, fatigue, decreased balance)         Wheelchair     Assist Will patient use wheelchair at discharge?: Yes Type of Wheelchair: Manual    Wheelchair assist level: Supervision/Verbal cueing Max wheelchair distance: >1558f   Wheelchair 50 feet with 2 turns activity    Assist        Assist Level: Supervision/Verbal cueing   Wheelchair 150 feet activity     Assist      Assist Level: Supervision/Verbal cueing   Blood pressure 122/65, pulse 86, temperature 98.3 F (36.8 C), temperature source Oral, resp. rate 17, height '5\' 9"'$  (1.753 m), weight 113.7 kg, SpO2 99 %.  Medical Problem List and Plan: 1.Functional and mobility deficitssecondary to debility after cardiac arrest, volume overload multiple medical. -patient may shower -ELOS/Goals: 18-20 days, supervision to min assist with self-care and w/c level mobility  -Continue CIR PT, OT  -Interdisciplinary Team Conference today  2. Antithrombotics: -DVT/anticoagulation:Mechanical:Sequential compression devices, below kneeBilateral lower extremities -antiplatelet therapy: To resume ASA March 7thper Dr. OrOlevia Bowensor CVA prophylaxis 3. Pain Management:Will d/c IV dilaudid--> continue oxycodone prn, doing well with current regimen.   3/4: TENS unit ordered. Encouraged heat for lower back  3/7: add Amitriptyline '10mg'$  at night to help with sleep so he does not need Percocet then.  3/8: increase Amitriptyline to '25mg'$  as still had to wake last night for pain meds.   -discussed Kpad to right thigh hip as well. 4. Mood:LCSW to follow for evaluation and support. -antipsychotic agents: N/A 5. Neuropsych: This  patientiscapable of making decisions on hisown behalf. 6. Skin/Wound Care:Routine pressure relief measures. 7. Fluids/Electrolytes/Nutrition:Strict I/O. Monitor weights daily. Renal diet with 1200 cc/FR --consult dietician to educate patient on renal diet.  8. ESRD: Has been transitioned to HD--clipped for MWF at [facility] --schedule HD at the end of the day to help with tolerance of therapy  Cr up to 9.36- continue to monitor with HD labs 9. H/o Seizure d/o: Continue Lamictal and Keppra--monitor for breakthrough seizures.  10. T2DM: Monitor BS ac/hs--has been diet controlled. --Continue SSI for now CBG (last 3)  No results for input(s): GLUCAP in the last 72 hours.low yesterday , asymptomatic meal intake is good   11. HTN:Hgb A1c-5.6 and diet controlled for the past 9 months.  --will monitor BP tid--has been trending upwards.  --Continue Amlodipine, metoprolol and Imdur and titrate medications as indicated.  --Will order orthostatic vitals as continues to have increased nausea with activity.  12. Spontaneous, large right iliopsoashematoma:  Leucocytosis resolved, continue to monitor with HD. Afebrile. Hgb stable at 8.5 -pain control as above 13. 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  -consult with ortho while here regarding considerations. Not a surgical candidate at this point, and he might never be unfortunately.  -pain control and activity modification.  14. OSA: Non-compliant with CPAP. 15. Acute on chronic anemia: Continue to monitor H/H. ON aranesp weekly.  --INR slowing normalizing  Hgb down to 7.8 on 3/7, continue to monitor with next HD labs  16.Neuropathy: acute on chronic now  with worsening of symptoms RLE due to compression.  3/4: can discuss increasing Lyrica dose.  17. Dizziness: meclizine and vestibular eval ordered. Advised to ask for Meclizine PRN if needed.   3/4: scopolamine patch ordered since Meclizine is not providing benefit.  18. Tachycardic to 102- continue to monitor TID `9. Nausea: EKG to assess qTC. Schedule Compazine '5mg'$  q8H.     LOS: 7 days A FACE TO FACE EVALUATION WAS PERFORMED  Max Pittman Walle 04/02/2020, 8:51 AM

## 2020-04-02 NOTE — Progress Notes (Signed)
Patient ID: Max Pittman., male   DOB: June 26, 1975, 45 y.o.   MRN: [id number] Team Conference Report to Patient/Family  Team Conference discussion was reviewed with the patient and caregiver, including goals, any changes in plan of care and target discharge date.  Patient and caregiver express understanding and are in agreement.  The patient has a target discharge date of 04/12/20.  Dyanne Iha 04/02/2020, 2:05 PM

## 2020-04-02 NOTE — Progress Notes (Signed)
Patient ID: Max Pittman., male   DOB: Jan 09, 1976, 45 y.o.   MRN: [id number] [name] Progress Note   Assessment/ Plan:   1.  Acute hypoxic respiratory failure/ cardiac arrest: due to severe volume overload/pulmonary edema causing asystolic cardiac arrest. Resolved. Now in rehab due to deconditioning  2. ESRD: severe vol overload on admission. Had CRRT w/ 31 L removed.  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 Transitioned to HD. IR placed TDC on 2/23 and PD cath removed 2/25 per gen surgery. Is interested in home hemo-  Usually we want a fistula for that. Now in inpatient rehab- will look to get AVF done toward the end of his stay.  Tentatively for now has OP spot at [facility] on MWF early AM.  HD tomorrow.  3. Retroperitoneal hematoma: seen by CT 2/24 large R iliopsoas hematoma 10 x 7 x 20 cm. Anticoagulation on hold.  4. Anemia ckd: Hb in 7.5- 8.5 range, getting darbe 100 ug  q thurs last on 3/03.  Tsat on 3/5 was 9% > ordered IV Fe bolus per pharm.  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 Nutrition: On renal diet  7. Hypertension: euvolemic on exam, no edema 8. 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 H/o CVA/ hx seizures - taking lamictal and keppra 10. Dispo - on inpatient rehab - will not know tentative d/c date til 3/8  [name], MD 04/02/2020, 3:11 PM        Subjective:   No c/o, seen in room. In good spirits.    Objective:   BP 108/62 (BP Location: Right Arm)   Pulse 83   Temp 97.8 F (36.6 C) (Oral)   Resp 15   Ht '5\' 9"'$  (1.753 m)   Wt 113.7 kg   SpO2 100%   BMI 37.02 kg/m   Physical Exam: Gen: Resting comfortably in bed CVS: RRR no mrg   Resp: CTA, no rales or wheezing Abd: Soft, obese, nontender, pd cath removed Ext: no LE or UE edema  Thrombosed LUA AVF   R IJ [facility]    Labs: BMET Recent Labs  Lab 03/28/20 0652 03/30/20 1533 04/01/20 1415  NA 136 133* 134*  K 4.9 4.8  4.5  CL 99 99 97*  CO2 '24 25 25  '$ GLUCOSE 77 110* 145*  BUN 45* 50* 42*  CREATININE 9.34* 9.36* 8.73*  CALCIUM 9.5 9.1 9.1  PHOS 4.5 3.5 2.5   CBC Recent Labs  Lab 03/28/20 0652 03/30/20 1534 04/01/20 1428  WBC 11.4* 10.4 9.4  HGB 9.9* 8.0* 7.8*  HCT 31.4* 26.3* 26.8*  MCV 92.1 93.6 94.4  PLT 374 284 287     Medications:    . amitriptyline  25 mg Oral QHS  . aspirin EC  81 mg Oral Daily  . atorvastatin  40 mg Oral q1800  . buPROPion  150 mg Oral BID WC  . cephALEXin  250 mg Oral Q12H  . Chlorhexidine Gluconate Cloth  6 each Topical Q0600  . [START ON 04/05/2020] darbepoetin (ARANESP) injection - DIALYSIS  100 mcg Intravenous Q Fri-HD  . docusate sodium  100 mg Oral BID  . feeding supplement (NEPRO CARB STEADY)  237 mL Oral TID BM  . FLUoxetine  10 mg Oral Daily  . isosorbide mononitrate  15 mg Oral Daily  . lamoTRIgine  100 mg Oral BID  . levETIRAcetam  500 mg Oral Daily  .  loratadine  10 mg Oral Daily  . meclizine  12.5 mg Oral TID  . mouth rinse  15 mL Mouth Rinse BID  . melatonin  5 mg Oral QHS  . metoprolol tartrate  12.5 mg Oral BID  . multivitamin  1 tablet Oral QHS  . Muscle Rub   Topical TID WC & HS  . pantoprazole  40 mg Oral Daily  . polyethylene glycol  17 g Oral Daily  . pregabalin  75 mg Oral QHS  . prochlorperazine  5-10 mg Oral Q8H   Or  . prochlorperazine  5-10 mg Intramuscular Q8H   Or  . prochlorperazine  12.5 mg Rectal Q8H  . sevelamer carbonate  2,400 mg Oral TID WC

## 2020-04-02 NOTE — Patient Care Conference (Signed)
Inpatient RehabilitationTeam Conference and Plan of Care Update Date: 04/02/2020   Time: 9:35 AM    Patient Name: Max Pittman.      Medical Record Number: [id number]  Date of Birth: 1975-12-09 Sex: Male         Room/Bed: 5C06C/5C06C-01 Payor Info: Payor: MEDICARE / Plan: MEDICARE PART A AND B / Product Type: *No Product type* /    Admit Date/Time:  03/26/2020  3:23 PM  Primary Diagnosis:  [facility] Problems: Principal Problem:   Debility    Expected Discharge Date: Expected Discharge Date: 04/12/20  Team Members Present: Physician leading conference: Dr. Leeroy Cha Care Coodinator Present: Erlene Quan, BSW;Maysie Parkhill Creig Hines, RN, BSN, [facility] Nurse Present: Other (comment) Jarvis Morgan, RN) PT Present: Becky Sax, PT OT Present: Simonne Come, OT PPS Coordinator present : Gunnar Fusi, SLP     Current Status/Progress Goal Weekly Team Focus  Bowel/Bladder   continent b/b, refusing stool softener  remain continent      Swallow/Nutrition/ Hydration             ADL's   CGA sit > stand and stand pivot transfers with RW, CGA LB bathing and dressing.  Pt with a lot of nausea with mobility limiting even EOB and sink self-care tasks  Supervision overall  ADL retraining, sit > stand, dynamic standing balance, ambulatory transfers, activity tolerance   Mobility   bed mobility supervision, transfers with RW min A, gait 4f with RW and min A +2 for WC follow, WC mobility >[id number]  supervision transfers, CGA gait, min A steps, mod I WC mobility  functional mobility/transfers, generalized strengthening, dynamic standing balance/coordination, ambulation, pain management, and endurance.   Communication             Safety/Cognition/ Behavioral Observations            Pain   8/10 pain  <4 on a 0-10 pain scale  assess pain q 4 hr and prn   Skin   wounds healing and OTA  no new skin breakdown while on rehab  assess skin q shift and prn      Discharge Planning:  Discharge home with significant other and daughter   Team Discussion: Complains of dizziness, MD put in for a Vestibular eval. Titrating Amitriptyline up to reduce the use of Percocet at night. Continue with HD. Continent B/B, refusing stool softener, complains of pain with Percocet given. Wounds healing and OTA. Plans are to discharge home with girlfriend and daughter. Patient on target to meet rehab goals[facility] request medication be given before therapy sessions. Did better with nausea medications on board. Bathroom at home is not WC accessible, will have to problem solve this new barrier.  *See Care Plan and progress notes for long and short-term goals.   Revisions to Treatment Plan:  Schedule Compazine, increase Amitriptyline, [facility]: Family education, medication management, skin/wound care, bowel management, transfer training, gait training, balance training, endurance training, pain management.  Current Barriers to Discharge: Inaccessible home environment, Decreased caregiver support, Home enviroment access/layout, Wound care, Hemodialysis, Medication compliance and Behavior  Possible Resolutions to Barriers: Continue current medications, provide emotional support.     Medical Summary Current Status: Pain in right lower extremity from hematoma, chronic low back pain, Cr 8.37, nausea  Barriers to Discharge: Medical stability  Barriers to Discharge Comments: Pain in right lower extremity from hematoma, chronic low back pain,  Cr 8.37, nasuea Possible Resolutions to [employer]: Receiving Percocet, titrate up Amitriptyline to reduce use of Percocet at night, continue HD, schedule Compazine/EKG shows normal qTC interval   Continued Need for Acute Rehabilitation Level of Care: The patient requires daily medical management by a physician with specialized training  in physical medicine and rehabilitation for the following reasons: Direction of a multidisciplinary physical rehabilitation program to maximize functional independence : Yes Medical management of patient stability for increased activity during participation in an intensive rehabilitation regime.: Yes Analysis of laboratory values and/or radiology reports with any subsequent need for medication adjustment and/or medical intervention. : Yes   I attest that I was present, lead the team conference, and concur with the assessment and plan of the team.   Cristi Loron 04/02/2020, 12:31 PM

## 2020-04-02 NOTE — Progress Notes (Signed)
Physical Therapy Session Note  Patient Details  Name: Max Pittman. MRN: [id number] Date of Birth: Jul 19, 1975  Today's Date: 04/02/2020 PT Individual Time: 1000-1047 PT Individual Time Calculation (min): 47 min   Today's Date: 04/02/2020 PT Missed Time: 13 Minutes Missed Time Reason: Patient fatigue  Short Term Goals: Week 1:  PT Short Term Goal 1 (Week 1): pt will transfer sit<>stand with LRAD and CGA PT Short Term Goal 2 (Week 1): pt will transfer bed<>chair with LRAD and CGA PT Short Term Goal 3 (Week 1): pt will ambulate 55f with LRAD and min A of 1  Skilled Therapeutic Interventions/Progress Updates:   Received pt semi-reclined in bed, pt agreeable to therapy, and reported pain 7/10 in BLE's but reported not being due for pain medication until 10:30. Nephrology MD present during session. Repositioning and rest breaks done to reduce pain levels. Session with emphasis on functional mobility/transfers, generalized strengthening, dynamic standing balance/coordination, ambulation, and improved activity tolerance. Pt completed bed mobility with supervision with HOB elevated x 2 trials throughout session. Donned socks and shoes with set up assist x 3 trials throughout session in figure four position. Pt performed stand<>pivot transfer into WC with RW and min A and performed WC mobility 1274fx 1 and 10057f 1 using BUE and supervision to 38M therapy gym. Pt ambulated [id number] and 5ft 54f with RW and min A. Pt did not wear shoe orthotic and demonstrated no knee buckling with gait but raises up onto L forefoot for swing phase. Therapist provided heel lift for R shoe and pt reported feeling more "even" in static standing. Pt transferred sit<>stand with RW and CGA x 4 trials and performed the following exercises standing with BUE support on RW and min A for balance with verbal cues for technique: -mini-squats 2x5 -alternating marches 1x10 1x12 Pt then reported 9/10 fatigue; therefore transitioned  to seated exercises. Pt performed the following exercises sitting on mat with supervision and verbal cues for technique: -hip flexion 2x10 bilaterally (decreased ROM on LLE) -bicep curls with 6lb dumbbells 2x10 bilaterally -hip adduction ball squeezes x10 with 5 second isometric hold Stand<>pivot mat<>WC with RW and CGA. [facility] and transferred WC<>bed with RW and CGA. Concluded session with pt supine in bed, needs within reach, and bed alarm on. 13 minutes missed of skilled physical therapy due to fatigue.   Therapy Documentation Precautions:  Precautions Precautions: Fall Precaution Comments: had fall 2/22 Restrictions Weight Bearing Restrictions: No  Therapy/Group: Individual Therapy [name] Alfonse AlpersDPT   04/02/2020, 7:34 AM

## 2020-04-02 NOTE — Progress Notes (Signed)
Pt refused SCDs. Education given. RN will continue to monitor.

## 2020-04-02 NOTE — Progress Notes (Addendum)
MEDICATION RELATED CONSULT NOTE - INITIAL   Pharmacy Consult for Ferrlicit Indication: Iron deficiency anemia  Allergies  Allergen Reactions  . Doxycycline Hives  . Latex Swelling    Pt reports swelling at site.   . Morphine And Related Anxiety    Patient Measurements: Height: '5\' 9"'$  (175.3 cm) Weight: 113.7 kg (250 lb 10.6 oz) IBW/kg (Calculated) : 70.7 Adjusted Body Weight:   Vital Signs: Temp: 97.8 F (36.6 C) (03/08 1303) Temp Source: Oral (03/08 1303) BP: 108/62 (03/08 1303) Pulse Rate: 83 (03/08 1303) Intake/Output from previous day: 03/07 0701 - 03/08 0700 In: 480 [P.O.:480] Out: 2000  Intake/Output from this shift: Total I/O In: 240 [P.O.:240] Out: 250 [Urine:250]  Labs: Recent Labs    04/01/20 1415 04/01/20 1428  WBC  --  9.4  HGB  --  7.8*  HCT  --  26.8*  PLT  --  287  CREATININE 8.73*  --   PHOS 2.5  --   ALBUMIN 2.0*  --    Estimated Creatinine Clearance: 13.6 mL/min (A) (by C-G formula based on SCr of 8.73 mg/dL (H)).   Microbiology: Recent Results (from the past 720 hour(s))  SARS CORONAVIRUS 2 (TAT 6-24 HRS) Nasopharyngeal Nasopharyngeal Swab     Status: None   Collection Time: 03/07/20  2:40 AM   Specimen: Nasopharyngeal Swab  Result Value Ref Range Status   SARS Coronavirus 2 NEGATIVE NEGATIVE Final    Comment: (NOTE) SARS-CoV-2 target nucleic acids are NOT DETECTED.  The SARS-CoV-2 RNA is generally detectable in upper and lower respiratory specimens during the acute phase of infection. Negative results do not preclude SARS-CoV-2 infection, do not rule out co-infections with other pathogens, and should not be used as the sole basis for treatment or other patient management decisions. Negative results must be combined with clinical observations, patient history, and epidemiological information. The expected result is Negative.  Fact Sheet for Patients: SugarRoll.be  Fact Sheet for Healthcare  Providers: https://www.woods-mathews.com/  This test is not yet approved or cleared by the [place] FDA and  has been authorized for detection and/or diagnosis of SARS-CoV-2 by FDA under an Emergency Use Authorization (EUA). This EUA will remain  in effect (meaning this test can be used) for the duration of the COVID-19 declaration under Se ction 564(b)(1) of the Act, 21 U.S.C. section 360bbb-3(b)(1), unless the authorization is terminated or revoked sooner.  Performed at [facility], [place] [address], [place], [place] [address]   MRSA PCR Screening     Status: None   Collection Time: 03/07/20  9:16 AM   Specimen: Nasopharyngeal  Result Value Ref Range Status   MRSA by PCR NEGATIVE NEGATIVE Final    Comment:        The GeneXpert MRSA Assay (FDA approved for NASAL specimens only), is one component of a comprehensive MRSA colonization surveillance program. It is not intended to diagnose MRSA infection nor to guide or monitor treatment for MRSA infections. Performed at [facility], [place] [address], [place], [place] [address]   Culture, respiratory (non-expectorated)     Status: None   Collection Time: 03/07/20 10:09 AM   Specimen: Tracheal Aspirate; Respiratory  Result Value Ref Range Status   Specimen Description TRACHEAL ASPIRATE  Final   Special Requests NONE  Final   Gram Stain NO WBC SEEN RARE GRAM POSITIVE COCCI   Final   Culture   Final    Normal respiratory flora-no Staph aureus or Pseudomonas seen Performed at  [facility], [place] [address], [place], [place] [address]    Report Status 03/09/2020 FINAL  Final  Culture, blood (Routine X 2) w Reflex to ID Panel     Status: None   Collection Time: 03/07/20 11:59 AM   Specimen: BLOOD  Result Value Ref Range Status   Specimen Description BLOOD SITE NOT SPECIFIED  Final   Special Requests   Final    BOTTLES DRAWN AEROBIC AND ANAEROBIC Blood Culture adequate volume   Culture   Final     NO GROWTH 6 DAYS Performed at [facility], [place] [address], [place], [place] [address]    Report Status 03/13/2020 FINAL  Final  Culture, blood (Routine X 2) w Reflex to ID Panel     Status: None   Collection Time: 03/07/20  1:16 PM   Specimen: BLOOD  Result Value Ref Range Status   Specimen Description BLOOD LEFT ANTECUBITAL  Final   Special Requests   Final    BOTTLES DRAWN AEROBIC AND ANAEROBIC Blood Culture adequate volume   Culture   Final    NO GROWTH 6 DAYS Performed at [facility], [place] [address] [place], [place] [address]    Report Status 03/13/2020 FINAL  Final  Culture, blood (Routine X 2) w Reflex to ID Panel     Status: None   Collection Time: 03/19/20 10:30 AM   Specimen: BLOOD RIGHT HAND  Result Value Ref Range Status   Specimen Description BLOOD RIGHT HAND  Final   Special Requests   Final    BOTTLES DRAWN AEROBIC ONLY Blood Culture results may not be optimal due to an inadequate volume of blood received in culture bottles   Culture   Final    NO GROWTH 5 DAYS Performed at [facility], [place] [address] [place], [place] [address]    Report Status 03/24/2020 FINAL  Final  Culture, blood (Routine X 2) w Reflex to ID Panel     Status: None   Collection Time: 03/19/20 10:41 AM   Specimen: BLOOD RIGHT WRIST  Result Value Ref Range Status   Specimen Description BLOOD RIGHT WRIST  Final   Special Requests   Final    BOTTLES DRAWN AEROBIC AND ANAEROBIC Blood Culture adequate volume   Culture   Final    NO GROWTH 5 DAYS Performed at [facility], [place] [address] [place], [place] [address]    Report Status 03/24/2020 FINAL  Final  Body fluid culture w Gram Stain     Status: None   Collection Time: 03/20/20  8:12 AM   Specimen: Peritoneal Dialysate; Body Fluid  Result Value Ref Range Status   Specimen Description PERITONEAL DIALYSATE  Final   Special Requests Immunocompromised  Final   Gram Stain   Final    RARE WBC PRESENT, PREDOMINANTLY  MONONUCLEAR NO ORGANISMS SEEN    Culture   Final    NO GROWTH 3 DAYS Performed at [facility], [address], [place], [place] [address]    Report Status 03/23/2020 FINAL  Final    Medical History: Past Medical History:  Diagnosis Date  . Arthritis   . Closed dislocation of left hip ([place]) 05/21/2019  . Diabetes ([place])   . Diabetes mellitus without complication ([place])   . DVT (deep venous thrombosis) ([place])   . History of peritoneal dialysis   . Hypertension   . Renal disorder    FFGS  . Renal disorder   .  Sleep apnea   . Stroke [facility])    when ha was a child  . Vasovagal syndrome    with syncope    Medications:  Medications Prior to Admission  Medication Sig Dispense Refill Last Dose  . acetaminophen (TYLENOL) 325 MG tablet Take 2 tablets (650 mg total) by mouth every 8 (eight) hours. (Patient taking differently: Take 650 mg by mouth every 6 (six) hours as needed for mild pain.)     . amLODipine (NORVASC) 5 MG tablet Take 1 tablet (5 mg total) by mouth daily. 30 tablet 0   . aspirin 81 MG EC tablet Take 1 tablet (81 mg total) by mouth daily. 30 tablet 12   . atorvastatin (LIPITOR) 40 MG tablet Take 1 tablet (40 mg total) by mouth daily at 6 PM. 31 tablet 6   . baclofen (LIORESAL) 10 MG tablet Take 10 mg by mouth 3 (three) times daily.     Marland Kitchen buPROPion (WELLBUTRIN SR) 150 MG 12 hr tablet Take 1 tablet (150 mg total) by mouth 2 (two) times daily with a meal. 60 tablet 0   . cephALEXin (KEFLEX) 250 MG capsule Take 1 capsule (250 mg total) by mouth every 12 (twelve) hours. 60 capsule 3   . docusate sodium (COLACE) 100 MG capsule Take 300 mg by mouth daily as needed for mild constipation or moderate constipation. As directed      . FLUoxetine (PROZAC) 10 MG tablet Take 10 mg by mouth daily.     . hydrOXYzine (ATARAX/VISTARIL) 25 MG tablet Take 25 mg by mouth every 8 (eight) hours as needed for itching.     . lamoTRIgine (LAMICTAL) 100 MG tablet Take 1 tablet (100 mg total) by  mouth 2 (two) times daily. 60 tablet 3   . levETIRAcetam (KEPPRA) 500 MG tablet Take 1 tablet (500 mg total) by mouth daily. (Patient taking differently: Take 500 mg by mouth at bedtime.) 90 tablet 3   . loratadine (CLARITIN) 10 MG tablet Take 1 tablet (10 mg total) by mouth daily. 30 tablet 0   . metoprolol tartrate (LOPRESSOR) 25 MG tablet Take 0.5 tablets (12.5 mg total) by mouth 2 (two) times daily. 30 tablet 0   . omeprazole (PRILOSEC) 40 MG capsule Take 40 mg by mouth daily.     Marland Kitchen oxyCODONE-acetaminophen (PERCOCET/ROXICET) 5-325 MG tablet Take 1 tablet by mouth every 6 (six) hours as needed for pain.     . pioglitazone (ACTOS) 15 MG tablet Take 1 tablet (15 mg total) by mouth daily. (Patient not taking: No sig reported) 30 tablet 0   . pregabalin (LYRICA) 50 MG capsule Take 1 capsule (50 mg total) by mouth at bedtime. (Patient taking differently: Take 50 mg by mouth 3 (three) times daily.) 30 capsule 0   . sevelamer carbonate (RENVELA) 800 MG tablet Take 3 tablets (2,400 mg total) by mouth 3 (three) times daily with meals. 240 tablet 1    Scheduled:  . amitriptyline  25 mg Oral QHS  . aspirin EC  81 mg Oral Daily  . atorvastatin  40 mg Oral q1800  . buPROPion  150 mg Oral BID WC  . cephALEXin  250 mg Oral Q12H  . Chlorhexidine Gluconate Cloth  6 each Topical Q0600  . [START ON 04/05/2020] darbepoetin (ARANESP) injection - DIALYSIS  100 mcg Intravenous Q Fri-HD  . docusate sodium  100 mg Oral BID  . feeding supplement (NEPRO CARB STEADY)  237 mL Oral TID BM  . FLUoxetine  10  mg Oral Daily  . isosorbide mononitrate  15 mg Oral Daily  . lamoTRIgine  100 mg Oral BID  . levETIRAcetam  500 mg Oral Daily  . loratadine  10 mg Oral Daily  . meclizine  12.5 mg Oral TID  . mouth rinse  15 mL Mouth Rinse BID  . melatonin  5 mg Oral QHS  . metoprolol tartrate  12.5 mg Oral BID  . multivitamin  1 tablet Oral QHS  . Muscle Rub   Topical TID WC & HS  . pantoprazole  40 mg Oral Daily  .  polyethylene glycol  17 g Oral Daily  . pregabalin  75 mg Oral QHS  . prochlorperazine  5-10 mg Oral Q8H   Or  . prochlorperazine  5-10 mg Intramuscular Q8H   Or  . prochlorperazine  12.5 mg Rectal Q8H  . sevelamer carbonate  2,400 mg Oral TID WC   Infusions:  . ferric gluconate (FERRLECIT/NULECIT) Test Dose      Assessment: Pt is ESRD with iron deficiency anemia. Iron level of 15. Nephrology consulted to do iron replacement. After discussing with Dr. Jonnie Finner, we will do Ferrlicit with a cumulative dose of '900mg'$ .    Plan:  Ferrlicit '25mg'$  x1 test dose If neg, then '250mg'$  IV x1 then '125mg'$  qHD x 5  Onnie Boer, PharmD, [place], AAHIVP, CPP Infectious Disease Pharmacist 04/02/2020 3:52 PM

## 2020-04-02 NOTE — Progress Notes (Signed)
Physical Therapy Session Note  Patient Details  Name: Max Pittman. MRN: [id number] Date of Birth: 11-Apr-1975  Today's Date: 04/02/2020 PT Individual Time: 1433-1530 PT Individual Time Calculation (min): 57 min   Short Term Goals: Week 1:  PT Short Term Goal 1 (Week 1): pt will transfer sit<>stand with LRAD and CGA PT Short Term Goal 2 (Week 1): pt will transfer bed<>chair with LRAD and CGA PT Short Term Goal 3 (Week 1): pt will ambulate 9f with LRAD and min A of 1 Week 2:    Week 3:     Skilled Therapeutic Interventions/Progress Updates:    Pain:  Pt reports 4/10 pain, chronic back, L hip, R hip to knee aching pain.  Treatment to tolerance.  Rest breaks and repositioning as needed.  Pt initially supine and agreeable to treatment session w/focus on LE/core strengthening, balance, gait. Supine to sit w/supervision.  Sit to stand to RW w/min assist and bed elevated. Turn/sit to wc w/RW and cga.   Once in wc, pt propelled 445fw/bilat LEs for hamstring strengthening then propels mod I >12510fto gym for continued session.   Step ups at 3in single stair using 2 rails for support x 8 for quad/glut strengthening.  Hip abd w/green L3 theraband resistance x 20, stabilized w/LLE, AROM RLE  repeated Sit to stand from wc x 5 reps/functional strengthening  Gait: [id number] w/ cues for posture, toe IC on L due to leg length discrepancy, wobbles R knee but no buckling, one episode of L hand misplacement on walker w/mod assist for stability.  wc propulsion mod I x >150ft58froom, including elevator navigation.   stand pivot transfer wc to bed w/RW and cga.  Sit to supine w/supervision. Pt left supine w/rails up x 4, alarm set, bed in lowest position, and needs in reach.    Therapy Documentation Precautions:  Precautions Precautions: Fall Precaution Comments: had fall 2/22 Restrictions Weight Bearing Restrictions: No    Therapy/Group: Individual Therapy  BarbaCallie Fielding   [email or web address], 3:35 PM

## 2020-04-03 LAB — RENAL FUNCTION PANEL
Albumin: 2.3 g/dL — ABNORMAL LOW (ref 3.5–5.0)
Anion gap: 12 (ref 5–15)
BUN: 40 mg/dL — ABNORMAL HIGH (ref 6–20)
CO2: 25 mmol/L (ref 22–32)
Calcium: 9.8 mg/dL (ref 8.9–10.3)
Chloride: 98 mmol/L (ref 98–111)
Creatinine, Ser: 8.2 mg/dL — ABNORMAL HIGH (ref 0.61–1.24)
GFR, Estimated: 8 mL/min — ABNORMAL LOW (ref >60)
Glucose, Bld: 100 mg/dL — ABNORMAL HIGH (ref 70–99)
Phosphorus: 2.6 mg/dL (ref 2.5–4.6)
Potassium: 4.2 mmol/L (ref 3.5–5.1)
Sodium: 135 mmol/L (ref 135–145)

## 2020-04-03 LAB — CBC
HCT: 30.5 % — ABNORMAL LOW (ref 39.0–52.0)
Hemoglobin: 9.3 g/dL — ABNORMAL LOW (ref 13.0–17.0)
MCH: 28 pg (ref 26.0–34.0)
MCHC: 30.5 g/dL (ref 30.0–36.0)
MCV: 91.9 fL (ref 80.0–100.0)
Platelets: 326 10*3/uL (ref 150–400)
RBC: 3.32 MIL/uL — ABNORMAL LOW (ref 4.22–5.81)
RDW: 15.2 % (ref 11.5–15.5)
WBC: 8.6 10*3/uL (ref 4.0–10.5)
nRBC: 0 % (ref 0.0–0.2)

## 2020-04-03 MED ORDER — MECLIZINE HCL 25 MG PO TABS
25.0000 mg | ORAL_TABLET | Freq: Three times a day (TID) | ORAL | Status: DC
  Administered 2020-04-03 – 2020-04-11 (×16): 25 mg via ORAL
  Filled 2020-04-03 (×25): qty 1

## 2020-04-03 MED ORDER — PROCHLORPERAZINE MALEATE 10 MG PO TABS
10.0000 mg | ORAL_TABLET | Freq: Three times a day (TID) | ORAL | Status: DC
  Administered 2020-04-03 – 2020-04-11 (×20): 10 mg via ORAL
  Filled 2020-04-03 (×25): qty 1

## 2020-04-03 MED ORDER — PROCHLORPERAZINE 25 MG RE SUPP
12.5000 mg | Freq: Three times a day (TID) | RECTAL | Status: DC
  Filled 2020-04-03 (×26): qty 1

## 2020-04-03 MED ORDER — HEPARIN SODIUM (PORCINE) 1000 UNIT/ML IJ SOLN
INTRAMUSCULAR | Status: AC
  Administered 2020-04-03: 1000 [IU]
  Filled 2020-04-03: qty 4

## 2020-04-03 MED ORDER — PROCHLORPERAZINE EDISYLATE 10 MG/2ML IJ SOLN
5.0000 mg | Freq: Three times a day (TID) | INTRAMUSCULAR | Status: DC
  Filled 2020-04-03: qty 2

## 2020-04-03 NOTE — Progress Notes (Signed)
PROGRESS NOTE   Subjective/Complaints: Slept better with Amitriptyline. Continues to have nausea and dizziness which is limiting his participation in therapy Hgb improved to 9.3 Cr down to 8.2  ROS: +right lower extremity pain, + nausea, +insomnia, +dizziness  Objective:   No results found. Recent Labs    04/01/20 1428 04/03/20 1134  WBC 9.4 8.6  HGB 7.8* 9.3*  HCT 26.8* 30.5*  PLT 287 326   Recent Labs    04/01/20 1415 04/03/20 1134  NA 134* 135  K 4.5 4.2  CL 97* 98  CO2 25 25  GLUCOSE 145* 100*  BUN 42* 40*  CREATININE 8.73* 8.20*  CALCIUM 9.1 9.8    Intake/Output Summary (Last 24 hours) at 04/03/2020 1936 Last data filed at 04/03/2020 1900 Gross per 24 hour  Intake 1000 ml  Output 1855 ml  Net -855 ml        Physical Exam: Vital Signs Blood pressure 123/72, pulse 92, temperature 98.9 F (37.2 C), temperature source Oral, resp. rate 18, height '5\' 9"'$  (1.753 m), weight 117.6 kg, SpO2 98 %. Gen: no distress, normal appearing HEENT: oral mucosa pink and moist, NCAT Cardio: Reg rate Chest: normal effort, normal rate of breathing Abd: soft, non-distended Ext: no edema Psych: pleasant, normal affect Skin: intact Musculoskeletal:  General: Swelling(right thigh)and tenderness(right thigh, hip as well as left hip)present.  Cervical back: Normal range of motion.  Skin: General: Skin is warmand dry.  Comments: Left > right shin with pock marked healed lesions. Neurological:  Mental Status: He is alertand oriented to person, place, and time. Mental status isat baseline.  Cranial Nerves: No cranial nerve deficit.  Comments: Motor 4+/5 UE. RLE limited by pain 2/5 prox to 4/5 distally. LLE 3/5 prox to 4/5 distally. Distal LE sensory loss to LT. DTR's 1+ Psychiatric:  Mood and Affect: Moodnormal.  Behavior: Behaviornormal.  Thought Content: Thought  contentnormal.    Assessment/Plan: 1. Functional deficits which require 3+ hours per day of interdisciplinary therapy in a comprehensive inpatient rehab setting.  Physiatrist is providing close team supervision and 24 hour management of active medical problems listed below.  Physiatrist and rehab team continue to assess barriers to discharge/monitor patient progress toward functional and medical goals  Care Tool:  Bathing    Body parts bathed by patient: Right arm,Left arm,Chest,Abdomen,Front perineal area,Buttocks,Right upper leg,Left upper leg,Right lower leg,Left lower leg,Face         Bathing assist Assist Level: Contact Guard/Touching assist     Upper Body Dressing/Undressing Upper body dressing   What is the patient wearing?: Pull over shirt    Upper body assist Assist Level: Set up assist    Lower Body Dressing/Undressing Lower body dressing      What is the patient wearing?: Pants,Underwear/pull up     Lower body assist Assist for lower body dressing: Contact Guard/Touching assist     Toileting Toileting    Toileting assist Assist for toileting: Independent with assistive device Assistive Device Comment: urinal   Transfers Chair/bed transfer  Transfers assist     Chair/bed transfer assist level: Minimal Assistance - Patient > 75%     Locomotion Ambulation   Ambulation assist  Assist level: 2 helpers Assistive device: Walker-rolling Max distance: 50   Walk 10 feet activity   Assist  Walk 10 feet activity did not occur: Safety/medical concerns (bilateral knee buckling, weakness, fatigue, decreased balance)  Assist level: 2 helpers Assistive device: Walker-rolling   Walk 50 feet activity   Assist Walk 50 feet with 2 turns activity did not occur: Safety/medical concerns (bilateral knee buckling, weakness, fatigue, decreased balance)  Assist level: 2 helpers Assistive device: Walker-rolling    Walk 150 feet  activity   Assist Walk 150 feet activity did not occur: Safety/medical concerns (bilateral knee buckling, weakness, fatigue, decreased balance)         Walk 10 feet on uneven surface  activity   Assist Walk 10 feet on uneven surfaces activity did not occur: Safety/medical concerns (bilateral knee buckling, weakness, fatigue, decreased balance)         Wheelchair     Assist Will patient use wheelchair at discharge?: Yes Type of Wheelchair: Manual    Wheelchair assist level: Supervision/Verbal cueing Max wheelchair distance: >1105f    Wheelchair 50 feet with 2 turns activity    Assist        Assist Level: Supervision/Verbal cueing   Wheelchair 150 feet activity     Assist      Assist Level: Supervision/Verbal cueing   Blood pressure 123/72, pulse 92, temperature 98.9 F (37.2 C), temperature source Oral, resp. rate 18, height '5\' 9"'$  (1.753 m), weight 117.6 kg, SpO2 98 %.  Medical Problem List and Plan: 1.Functional and mobility deficitssecondary to debility after cardiac arrest, volume overload multiple medical. -patient may shower -ELOS/Goals: 18-20 days, supervision to min assist with self-care and w/c level mobility  -Continue CIR PT, OT  Nausea and dizziness continue to limit therapy. 2. Antithrombotics: -DVT/anticoagulation:Mechanical:Sequential compression devices, below kneeBilateral lower extremities -antiplatelet therapy: Aspirin resumed March 7th.  3. Pain Management:Will d/c IV dilaudid--> continue oxycodone prn, doing well with current regimen.   3/4: TENS unit ordered. Encouraged heat for lower back  3/7: add Amitriptyline '10mg'$  at night to help with sleep so he does not need Percocet then.  3/8: increase Amitriptyline to '25mg'$  as still had to wake last night for pain meds.    3/9: increased dose of Amitriptyline helped, continue this dose.  -discussed Kpad to right thigh hip as  well. 4. Mood:LCSW to follow for evaluation and support. -antipsychotic agents: N/A 5. Neuropsych: This patientiscapable of making decisions on hisown behalf. 6. Skin/Wound Care:Routine pressure relief measures. 7. Fluids/Electrolytes/Nutrition:Strict I/O. Monitor weights daily. Renal diet with 1200 cc/FR --consult dietician to educate patient on renal diet.  8. ESRD: Has been transitioned to HD[facility]  --schedule HD at the end of the day to help with tolerance of therapy  Cr down to 8.2 on 3/9- continue to monitor with HD labs 9. H/o Seizure d/o: Continue Lamictal and Keppra--monitor for breakthrough seizures.  10. T2DM: Monitor BS ac/hs--has been diet controlled. --Continue SSI for now CBG (last 3)  No results for input(s): GLUCAP in the last 72 hours.low yesterday , asymptomatic meal intake is good   11. HTN:Hgb A1c-5.6 and diet controlled for the past 9 months.  --will monitor BP tid--has been trending upwards.  --Continue Amlodipine, metoprolol and Imdur and titrate medications as indicated.  --Will order orthostatic vitals as continues to have increased nausea with activity.  12. Spontaneous, large right iliopsoashematoma:  Leucocytosis resolved, continue to monitor with HD. Afebrile. Hgb stable at 8.5 -pain control as  above 13. 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  -consult with ortho while here regarding considerations. Not a surgical candidate at this point, and he might never be unfortunately.  -pain control and activity modification.  14. OSA: Non-compliant with CPAP. 15. Acute on chronic anemia: Continue to monitor H/H. ON aranesp weekly.   --INR slowing normalizing  Hgb down to 7.8 on 3/7, continue to monitor with next HD labs  16.Neuropathy: acute on chronic now with worsening of symptoms RLE due to compression.  3/4: can discuss increasing Lyrica dose.  17. Dizziness: meclizine and vestibular eval ordered. Advised to ask for Meclizine PRN if needed.    3/9: scopolamine patch d/ced since not beneficial. Schedule Meclizine '25mg'$  TID. 18. Tachycardic to 102- continue to monitor TID `9. Nausea: EKG to assess qTC.Increase Compazine to '10mg'$  q8H.     LOS: 8 days A FACE TO FACE EVALUATION WAS PERFORMED  Max Pittman 04/03/2020, 7:36 PM

## 2020-04-03 NOTE — Progress Notes (Signed)
Physical Therapy Session Note  Patient Details  Name: Max Pittman. MRN: [id number] Date of Birth: 07/12/1975  Today's Date: 04/03/2020 PT Individual Time: 1100-1127 PT Individual Time Calculation (min): 27 min   Short Term Goals: Week 1:  PT Short Term Goal 1 (Week 1): pt will transfer sit<>stand with LRAD and CGA PT Short Term Goal 2 (Week 1): pt will transfer bed<>chair with LRAD and CGA PT Short Term Goal 3 (Week 1): pt will ambulate 41f with LRAD and min A of 1  Skilled Therapeutic Interventions/Progress Updates:    Patient received reclined in bed, agreeable to PT. He reports pain in R medial knee, but did not rate, pre-medicated. PT providing rest breaks and distractions to assist in pain management. He was able to come sit edge of bed with supervision and transfer to wc via stand pivot with RW and MinA. Patient propelling himself in wc to 4th floor hallway ~1547fwith supervision using B UE. He ambulated 5046f2 with seated rest break between bouts with RW and MinA. Wc follow for safety. Due to leg length discrepancy, patient with toe-walking on L LE and slight inversion through stance/terminal stance. Discussed safe home modifications and energy conservation techniques. Patient agreeable to education provided. He propelled himself back to his room with supervision. Remaining up in wc, chair alarm on, call light within reach.   Therapy Documentation Precautions:  Precautions Precautions: Fall Precaution Comments: had fall 2/22 Restrictions Weight Bearing Restrictions: No    Therapy/Group: Individual Therapy  JenKaroline CaldwellT, DPT, CBIS  04/03/2020, 7:36 AM

## 2020-04-03 NOTE — Progress Notes (Signed)
Physical Therapy Session Note  Patient Details  Name: Max Pittman. MRN: [id number] Date of Birth: [birth year]  Today's Date: 04/03/2020 PT Individual Time: 0800-0853 PT Individual Time Calculation (min): 53 min   Short Term Goals: Week 1:  PT Short Term Goal 1 (Week 1): pt will transfer sit<>stand with LRAD and CGA PT Short Term Goal 2 (Week 1): pt will transfer bed<>chair with LRAD and CGA PT Short Term Goal 3 (Week 1): pt will ambulate 82f with LRAD and min A of 1  Skilled Therapeutic Interventions/Progress Updates:   Received pt supine in bed, pt agreeable to therapy, and reported pain 8/10 in BLE's. Repositioning, rest breaks, and distraction done to reduce pain levels. Session with emphasis on functional mobility/transfers, generalized strengthening, dynamic standing balance/coordination, ambulation, and improved activity tolerance. Pt performed bed mobility with supervision and donned non-skid socks with set up assist. Pt transferred bed<>WC stand<>pivot with RW and CGA/min A. Pt performed WC mobility 1222fx 1 and 7554f 1 using BUE and supervision to therapy gym. Pt ambulated 67f12fth RW and min A with 1 instance of R knee buckling but pt able to self-correct. Stand<>pivot WC<>mat with RW and min A. Worked on dynamic standing balance performing LLE toe taps to 3in step to 2 colored dots with BUE support on RW. Pt with sudden R knee buckling on trial 3 of activity requiring total A for controlled descent to floor. Pt with no c/o increased pain and denied any injury. Disccused multiple floor transfer techniqes and ultimately pt transferred long sitting on floor<>mat boosting up on arms with total A +3. Pt reported increased nausea and requested to lie down. Sit<>sidelying with CGA. BP: 132/82 HR 92bpm. [facility] with CGA and returned to room. Pt agreeable to stay sitting in WC until next session. Concluded session with pt sitting in WC, needs within reach, and chair  pad alarm on. RN notified of pt's current condition and events of therapy session.   Therapy Documentation Precautions:  Precautions Precautions: Fall Precaution Comments: had fall 2/22 Restrictions Weight Bearing Restrictions: No  Therapy/Group: Individual Therapy AnnaAlfonse Alpers DPT   04/03/2020, 7:45 AM

## 2020-04-03 NOTE — Progress Notes (Signed)
Occupational Therapy Session Note  Patient Details  Name: Max Pittman. MRN: [id number] Date of Birth: [birth year]  Today's Date: 04/03/2020 OT Individual Time: 1132-1140 OT Individual Time Calculation (min): 8 min  and Today's Date: 04/03/2020 OT Missed Time: 22 Minutes Missed Time Reason: Patient ill (comment) (Nausea)   Short Term Goals: Week 1:  OT Short Term Goal 1 (Week 1): Pt will complete toilet transfers stand pivot with min assist OT Short Term Goal 1 - Progress (Week 1): Met OT Short Term Goal 2 (Week 1): Pt will complete toileting with min assist at sit > stand level OT Short Term Goal 2 - Progress (Week 1): Met OT Short Term Goal 3 (Week 1): Pt will complete 2 grooming tasks in standing for increased standing tolerance OT Short Term Goal 3 - Progress (Week 1): Progressing toward goal OT Short Term Goal 4 (Week 1): Pt will complete LB dressing at sit > stand level with supervision OT Short Term Goal 4 - Progress (Week 1): Met Week 2:  OT Short Term Goal 1 (Week 2): STG = LTGs due to remaining LOS  Skilled Therapeutic Interventions/Progress Updates:    Pt greeted at time of session sitting up in wheelchair with nursing staff present, pt c/o Nausea and needing to get back to bed. Sit > stand CGA and transferred to bed same manner. Pt sitting EOB for nursing to provide med pass. Discussed role and purpose of OT and pt aware, but continued to declined further OT at this time d/t fatigue and nausea. Hand off to nursing, alarm on call bell in reach. Missed 22 mins d/t nausea.   Therapy Documentation Precautions:  Precautions Precautions: Fall Precaution Comments: had fall 2/22 Restrictions Weight Bearing Restrictions: No     Therapy/Group: Individual Therapy  Viona Gilmore 04/03/2020, 11:59 AM

## 2020-04-03 NOTE — Progress Notes (Signed)
Pt refused SCD. RN educated pt.

## 2020-04-03 NOTE — Progress Notes (Signed)
Occupational Therapy Weekly Progress Note  Patient Details  Name: Max Pittman. MRN: [id number] Date of Birth: November 18, 1975  Beginning of progress report period: March 27, 2020 End of progress report period: April 03, 2020  Today's Date: 04/03/2020 OT Individual Time: 5449-2010 OT Individual Time Calculation (min): 57 min    Patient has met 3 of 4 short term goals.  Pt is making steady progress towards goals as he has progressed to sit > stand with CGA and is able to complete dynamic standing with CGA to close supervision.  Pt completes stand pivot transfers with RW with CGA.  Pt is limited by pain and nausea with mobility.  PT has ordered a shoe orthotic to assist with leg length discrepancy for standing and mobility which should ease pain and increase mobility.  Pt is able to stand to complete oral care but fatigue and nausea increase with increased activity.  Patient continues to demonstrate the following deficits: muscle weakness, decreased cardiorespiratoy endurance and decreased standing balance, decreased postural control and decreased balance strategies and therefore will continue to benefit from skilled OT intervention to enhance overall performance with BADL and Reduce care partner burden.  Patient progressing toward long term goals..  Continue plan of care.  OT Short Term Goals Week 1:  OT Short Term Goal 1 (Week 1): Pt will complete toilet transfers stand pivot with min assist OT Short Term Goal 1 - Progress (Week 1): Met OT Short Term Goal 2 (Week 1): Pt will complete toileting with min assist at sit > stand level OT Short Term Goal 2 - Progress (Week 1): Met OT Short Term Goal 3 (Week 1): Pt will complete 2 grooming tasks in standing for increased standing tolerance OT Short Term Goal 3 - Progress (Week 1): Progressing toward goal OT Short Term Goal 4 (Week 1): Pt will complete LB dressing at sit > stand level with supervision OT Short Term Goal 4 - Progress (Week 1):  Met Week 2:  OT Short Term Goal 1 (Week 2): STG = LTGs due to remaining LOS  Skilled Therapeutic Interventions/Progress Updates:    Treatment session with focus on sit > stand, standing tolerance, and BUE and BLE strengthening.  Pt received upright in w/c having just received morning meds.  Pt reports fall during PT session, but not getting hurt.  Pt reports nausea and weakness in RLE this morning.  Engaged in grooming tasks standing in [place] for stability due to pt not wanting to complete unsupported standing this session.  Pt declined bathing and dressing this AM, reports SO to bring clothing this afternoon.  Pt propelled w/c to therapy gym for BUE strengthening.  Engaged in 2 sets of 10 chest and overhead presses with 2kg ball.  Engaged in [place] on [place] for 2 sets of 2-3 mins with good endurance, requiring rest break between bouts.  Pt returned to room and transferred back to bed stand pivot with RW with CGA.  Pt remained seated EOB with all needs in reach.  Therapy Documentation Precautions:  Precautions Precautions: Fall Precaution Comments: had fall 2/22 Restrictions Weight Bearing Restrictions: No General:   Vital Signs: Therapy Vitals Temp: 98.2 F (36.8 C) Temp Source: Oral Pulse Rate: 87 Resp: 14 BP: 130/69 Patient Position (if appropriate): Lying Oxygen Therapy SpO2: 94 % O2 Device: Room Air Pain: Pt with c/o pain 7/10 in RLE and lower back.  Premedicated.  Therapy/Group: Individual Therapy  Max Pittman 04/03/2020, 7:18 AM

## 2020-04-04 MED ORDER — AMITRIPTYLINE HCL 25 MG PO TABS
50.0000 mg | ORAL_TABLET | Freq: Every day | ORAL | Status: DC
  Administered 2020-04-04 – 2020-04-10 (×7): 50 mg via ORAL
  Filled 2020-04-04 (×7): qty 2

## 2020-04-04 NOTE — Progress Notes (Signed)
Patient ID: Max Pittman., male   DOB: [birth year], 45 y.o.   MRN: [id number] [facility] Progress Note   Assessment/ Plan:   1. Acute hypoxic respiratory failure/ cardiac arrest: due to severe volume overload/pulmonary edema causing asystolic cardiac arrest. Resolved. Now in rehab due to deconditioning 2. ESRD: severe vol overload on admission. Had CRRT w/ 31 L removed.  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 Transitioned to HD. IR placed TDC on 2/23 and PD cath removed 2/25 per gen surgery.   Tentatively has OP spot at [facility] on MWF early AM.  HD tomorrow.  3. HD access: pt needs permanent access but w/ current priority being rehabilitation recommend waiting until just prior to discharge before placing access. Will consult [facility] early next week. Tentative dc date is March 18th.  4. Retroperitoneal hematoma: seen by CT 2/24 large R iliopsoas hematoma 10 x 7 x 20 cm. Anticoagulation on hold.  5. Anemia ckd: Hb in 7.5- 8.5 range, getting darbe 100 ug  q thurs last on 3/03.  Tsat on 3/5 was 9% > ordered IV Fe bolus per pharm.  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 Nutrition: On renal diet  8. Hypertension: euvolemic on exam, no edema 9. 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 H/o CVA/ hx seizures - taking lamictal and keppra 11. Dispo - on inpatient rehab  [name], MD 04/04/2020, 11:25 AM        Subjective:   No c/o, seen in room. In good spirits.    Objective:   BP 123/74 (BP Location: Right Arm)   Pulse 97   Temp 98.6 F (37 C)   Resp 18   Ht '5\' 9"'$  (1.753 m)   Wt 117.6 kg   SpO2 99%   BMI 38.29 kg/m   Physical Exam: Gen: Resting comfortably in bed CVS: RRR no mrg   Resp: CTA, no rales or wheezing Abd: Soft, obese, nontender, pd cath removed Ext: no LE or UE edema  Thrombosed LUA AVF   R IJ [facility]    Labs: BMET Recent Labs  Lab 03/30/20 1533 04/01/20 1415 04/03/20 1134  NA 133*  134* 135  K 4.8 4.5 4.2  CL 99 97* 98  CO2 '25 25 25  '$ GLUCOSE 110* 145* 100*  BUN 50* 42* 40*  CREATININE 9.36* 8.73* 8.20*  CALCIUM 9.1 9.1 9.8  PHOS 3.5 2.5 2.6   CBC Recent Labs  Lab 03/30/20 1534 04/01/20 1428 04/03/20 1134  WBC 10.4 9.4 8.6  HGB 8.0* 7.8* 9.3*  HCT 26.3* 26.8* 30.5*  MCV 93.6 94.4 91.9  PLT 284 287 326     Medications:    . amitriptyline  25 mg Oral QHS  . aspirin EC  81 mg Oral Daily  . atorvastatin  40 mg Oral q1800  . buPROPion  150 mg Oral BID WC  . cephALEXin  250 mg Oral Q12H  . Chlorhexidine Gluconate Cloth  6 each Topical Q0600  . [START ON 04/05/2020] darbepoetin (ARANESP) injection - DIALYSIS  100 mcg Intravenous Q Fri-HD  . docusate sodium  100 mg Oral BID  . feeding supplement (NEPRO CARB STEADY)  237 mL Oral TID BM  . FLUoxetine  10 mg Oral Daily  . isosorbide mononitrate  15 mg Oral Daily  . lamoTRIgine  100 mg Oral BID  . levETIRAcetam  500 mg Oral Daily  . loratadine  10 mg  Oral Daily  . meclizine  25 mg Oral TID  . mouth rinse  15 mL Mouth Rinse BID  . melatonin  5 mg Oral QHS  . metoprolol tartrate  12.5 mg Oral BID  . multivitamin  1 tablet Oral QHS  . Muscle Rub   Topical TID WC & HS  . pantoprazole  40 mg Oral Daily  . polyethylene glycol  17 g Oral Daily  . pregabalin  75 mg Oral QHS  . prochlorperazine  10 mg Oral Q8H   Or  . prochlorperazine  5-10 mg Intramuscular Q8H   Or  . prochlorperazine  12.5 mg Rectal Q8H  . sevelamer carbonate  2,400 mg Oral TID WC

## 2020-04-04 NOTE — Progress Notes (Signed)
Physical Therapy Weekly Progress Note  Patient Details  Name: Max Pittman. MRN: [id number] Date of Birth: 03-07-1975  Beginning of progress report period: March 27, 2020 End of progress report period: April 04, 2020  Today's Date: 04/04/2020 PT Individual Time: 6387-5643 PT Individual Time Calculation (min): 40 min   Patient has met 2 of 3 short term goals. Pt demonstrates steady progress towards long term goals. Pt is currently able to perform bed mobility with supervision, sit<>stands with RW and CGA, stand<>pivot transfers with RW and CGA/min A, gait up to 34f with RW and min A, and supervision for WC mobility >156f Pt continues to have high pain levels and occasional nausea but is motivated to participate and progress in therapy. Orthotist has been consulted to examine L leg discrepancy, however as of 3/10 pt is now NWB on LLE and non-ambulatory due to hematoma.   Patient continues to demonstrate the following deficits muscle weakness, decreased cardiorespiratoy endurance and decreased standing balance, decreased postural control and decreased balance strategies and therefore will continue to benefit from skilled PT intervention to increase functional independence with mobility.  Patient progressing toward long term goals..  Continue plan of care.  PT Short Term Goals Week 1:  PT Short Term Goal 1 (Week 1): pt will transfer sit<>stand with LRAD and CGA PT Short Term Goal 1 - Progress (Week 1): Met PT Short Term Goal 2 (Week 1): pt will transfer bed<>chair with LRAD and CGA PT Short Term Goal 2 - Progress (Week 1): Progressing toward goal PT Short Term Goal 3 (Week 1): pt will ambulate 1057fith LRAD and min A of 1 PT Short Term Goal 3 - Progress (Week 1): Met Week 2:  PT Short Term Goal 1 (Week 2): STG=LTG due to LOS  Skilled Therapeutic Interventions/Progress Updates:  Ambulation/gait training;Discharge planning;Functional mobility training;Psychosocial support;Therapeutic  Activities;Balance/vestibular training;Disease management/prevention;Neuromuscular re-education;Skin care/wound management;Therapeutic Exercise;Wheelchair propulsion/positioning;Cognitive remediation/compensation;DME/adaptive equipment instruction;Pain management;Splinting/orthotics;UE/LE Strength taining/ROM;Community reintegration;Functional electrical stimulation;Patient/family education;Stair training;UE/LE Coordination activities  Today's Interventions: Pt found in bed with pain of 7/10 in R knee, B hips and B shoulders. Pt describes pain as sharp in the knee and hip and sore in the shoulders. Pt reports he is not due for pain meds for another hour. Repositioning and rest breaks done to reduce pain levels. Pt was educated on new NWB precautions for LLE and non-ambulatory status due to hematoma, with MD notified of pt request to consult further on new restriction. Pt then transferred to EOB with supervision and completed a L lateral scoot to WC with CGA while maintaining LLE NWB status. Pt completed WC propulsion with supervision for 150f46ffore requiring a rest break. From the WC, [name] completed B seated marching and LAQ with 3# weight for 2x12, followed by WC pushups for 2x8 and ball squeezes for 1x10 with 5 second isometric hold. Pt then self-propelled WC for 50ft71fil assisted with return to room. Pt completed R lateral scoot to EOB with supervision for transfer and Min A to correct 1 posterior LOB at end of transfer. Sit<>supine with supervision. At end of session PT further educated pt on appropriate assistive devices to consider in setting or NWB precautions if allowed to ambulate, but further emphasized current non-ambulatory status until medically cleared. Pt was left semi-reclined in bed with alarm on, nurse call bell and all needs in reach.   Therapy Documentation Precautions:  Precautions Precautions: Fall Precaution Comments: had fall 2/22 Restrictions Weight Bearing Restrictions:  No  Therapy/Group: Individual Therapy AnnaVicente Males  [place], DPT   04/04/2020, 7:29 AM

## 2020-04-04 NOTE — Progress Notes (Signed)
Occupational Therapy Session Note  Patient Details  Name: Max Pittman. MRN: [id number] Date of Birth: Feb 22, 1975  Today's Date: 04/04/2020 OT Individual Time: 4650-3546 OT Individual Time Calculation (min): 56 min    Short Term Goals: Week 1:  OT Short Term Goal 1 (Week 1): Pt will complete toilet transfers stand pivot with min assist OT Short Term Goal 1 - Progress (Week 1): Met OT Short Term Goal 2 (Week 1): Pt will complete toileting with min assist at sit > stand level OT Short Term Goal 2 - Progress (Week 1): Met OT Short Term Goal 3 (Week 1): Pt will complete 2 grooming tasks in standing for increased standing tolerance OT Short Term Goal 3 - Progress (Week 1): Progressing toward goal OT Short Term Goal 4 (Week 1): Pt will complete LB dressing at sit > stand level with supervision OT Short Term Goal 4 - Progress (Week 1): Met Week 2:  OT Short Term Goal 1 (Week 2): STG = LTGs due to remaining LOS   Skilled Therapeutic Interventions/Progress Updates:    Pt greeted at time of session on commode with stedy in front of patient, used for nursing to get to commode. Sit > stand in [place] CGA and transferred back to bed. Pt had already completed hygiene and CGA for clothing management in stedy. Agreeable to wash up, stand pivot bed > wheelchair CGA with RW and set up at sink with doors opened under sink for pt to place feet and roll up to sink for UB/LB bathing CGA overall for standing to wash buttocks and periarea, able to reach feet with figure four. UB dress set up LB dress CGA with figure four including socks with Set up. After ADL, self propel room > elevator w/ Supervision and taken to gym. Performed 4x20 rebounder tosses with green 2kg ball for UB strength and core strengthening for the following: 2 sets of throws, 1 set throw + overhead press, 1 set throw + torso twist. Noted some discomfort in shoulders, performed 1x10 of the following: overhead raise w/ stretch, rhomboid stretch  w/ forward reach, and shoudler rolls. Transported back to room and stand pivot to bed CGA w/ RW. Supine with alarm on call bell in reach. Mild nausea today not limiting with RN performing med pass at end of session.   Therapy Documentation Precautions:  Precautions Precautions: Fall Precaution Comments: had fall 2/22 Restrictions Weight Bearing Restrictions: No     Therapy/Group: Individual Therapy  Viona Gilmore 04/04/2020, 7:10 AM

## 2020-04-04 NOTE — Progress Notes (Signed)
Patient ID: Max Pittman., male   DOB: [birth year], 45 y.o.   MRN: [id number]  SW made attempt to schedule family education, no answer-left VM.  SW received follow up call. Max Pittman unable to attend tomorrow for family education. Needs to make arrangements for Saturday, will follow up with SW if able to attend.   [place], [place]

## 2020-04-04 NOTE — Progress Notes (Signed)
PROGRESS NOTE   Subjective/Complaints: Had another night of poor sleep- discussed increasing Amitriptyline further to '50mg'$  and he is agreeable- monitor for orthostasis Discussed getting CT of right hip to assess for hematoma increase given his continued pain, though pain is overall improving.   ROS: +right lower extremity pain, + nausea, +insomnia, +dizziness  Objective:   No results found. Recent Labs    04/01/20 1428 04/03/20 1134  WBC 9.4 8.6  HGB 7.8* 9.3*  HCT 26.8* 30.5*  PLT 287 326   Recent Labs    04/01/20 1415 04/03/20 1134  NA 134* 135  K 4.5 4.2  CL 97* 98  CO2 25 25  GLUCOSE 145* 100*  BUN 42* 40*  CREATININE 8.73* 8.20*  CALCIUM 9.1 9.8    Intake/Output Summary (Last 24 hours) at 04/04/2020 1138 Last data filed at 04/04/2020 0700 Gross per 24 hour  Intake 860 ml  Output 1855 ml  Net -995 ml        Physical Exam: Vital Signs Blood pressure 123/74, pulse 97, temperature 98.6 F (37 C), resp. rate 18, height '5\' 9"'$  (1.753 m), weight 117.6 kg, SpO2 99 %. Gen: no distress, normal appearing HEENT: oral mucosa pink and moist, NCAT Cardio: Reg rate Chest: normal effort, normal rate of breathing Abd: soft, non-distended Ext: no edema Psych: pleasant, normal affect Skin: intact Musculoskeletal:  General: Swelling(right thigh)and tenderness(right thigh, hip as well as left hip)present.  Cervical back: Normal range of motion.  Skin: General: Skin is warmand dry.  Comments: Left > right shin with pock marked healed lesions. Neurological:  Mental Status: He is alertand oriented to person, place, and time. Mental status isat baseline.  Cranial Nerves: No cranial nerve deficit.  Comments: Motor 4+/5 UE. RLE limited by pain 3/5 prox to 4/5 distally. LLE 3/5 prox to 4/5 distally. Distal LE sensory loss to LT. DTR's 1+ Psychiatric:  Mood and Affect: Moodnormal.   Behavior: Behaviornormal.  Thought Content: Thought contentnormal.    Assessment/Plan: 1. Functional deficits which require 3+ hours per day of interdisciplinary therapy in a comprehensive inpatient rehab setting.  Physiatrist is providing close team supervision and 24 hour management of active medical problems listed below.  Physiatrist and rehab team continue to assess barriers to discharge/monitor patient progress toward functional and medical goals  Care Tool:  Bathing    Body parts bathed by patient: Right arm,Left arm,Chest,Abdomen,Front perineal area,Buttocks,Right upper leg,Left upper leg,Right lower leg,Left lower leg,Face         Bathing assist Assist Level: Contact Guard/Touching assist     Upper Body Dressing/Undressing Upper body dressing   What is the patient wearing?: Pull over shirt    Upper body assist Assist Level: Set up assist    Lower Body Dressing/Undressing Lower body dressing      What is the patient wearing?: Pants     Lower body assist Assist for lower body dressing: Contact Guard/Touching assist     Toileting Toileting    Toileting assist Assist for toileting: Independent with assistive device Assistive Device Comment: urinal   Transfers Chair/bed transfer  Transfers assist     Chair/bed transfer assist level: Minimal Assistance - Patient > 75%  Locomotion Ambulation   Ambulation assist      Assist level: 2 helpers Assistive device: Walker-rolling Max distance: 50   Walk 10 feet activity   Assist  Walk 10 feet activity did not occur: Safety/medical concerns (bilateral knee buckling, weakness, fatigue, decreased balance)  Assist level: 2 helpers Assistive device: Walker-rolling   Walk 50 feet activity   Assist Walk 50 feet with 2 turns activity did not occur: Safety/medical concerns (bilateral knee buckling, weakness, fatigue, decreased balance)  Assist level: 2 helpers Assistive device:  Walker-rolling    Walk 150 feet activity   Assist Walk 150 feet activity did not occur: Safety/medical concerns (bilateral knee buckling, weakness, fatigue, decreased balance)         Walk 10 feet on uneven surface  activity   Assist Walk 10 feet on uneven surfaces activity did not occur: Safety/medical concerns (bilateral knee buckling, weakness, fatigue, decreased balance)         Wheelchair     Assist Will patient use wheelchair at discharge?: Yes Type of Wheelchair: Manual    Wheelchair assist level: Supervision/Verbal cueing Max wheelchair distance: >128f    Wheelchair 50 feet with 2 turns activity    Assist        Assist Level: Supervision/Verbal cueing   Wheelchair 150 feet activity     Assist      Assist Level: Supervision/Verbal cueing   Blood pressure 123/74, pulse 97, temperature 98.6 F (37 C), resp. rate 18, height '5\' 9"'$  (1.753 m), weight 117.6 kg, SpO2 99 %.  Medical Problem List and Plan: 1.Functional and mobility deficitssecondary to debility after cardiac arrest, volume overload multiple medical. -patient may shower -ELOS/Goals: 18-20 days, supervision to min assist with self-care and w/c level mobility  -Continue CIR PT, OT  Nausea and dizziness continue to limit therapy, but are improved 2. Antithrombotics: -DVT/anticoagulation:Mechanical:Sequential compression devices, below kneeBilateral lower extremities -antiplatelet therapy: Aspirin resumed March 7th.  3. Pain Management:Will d/c IV dilaudid--> continue oxycodone prn, doing well with current regimen.   3/4: TENS unit ordered. Encouraged heat for lower back  3/7: add Amitriptyline '10mg'$  at night to help with sleep so he does not need Percocet then.  3/8: increase Amitriptyline to '25mg'$  as still had to wake last night for pain meds.    3/9: increased dose of Amitriptyline helped, continue this dose.  -discussed  Kpad to right thigh hip as well.  3/10: continue percocet: hopefully higher dose of Amitriptyline will help to decrease use 4. Mood:LCSW to follow for evaluation and support. -antipsychotic agents: N/A 5. Neuropsych: This patientiscapable of making decisions on hisown behalf. 6. Skin/Wound Care:Routine pressure relief measures. 7. Fluids/Electrolytes/Nutrition:Strict I/O. Monitor weights daily. Renal diet with 1200 cc/FR --consult dietician to educate patient on renal diet.  8. ESRD: Has been transitioned to HD[facility]  --schedule HD at the end of the day to help with tolerance of therapy  Cr down to 8.2 on 3/9- continue to monitor with HD labs 9. H/o Seizure d/o: Continue Lamictal and Keppra--monitor for breakthrough seizures.  10. T2DM: well controlled: may d/c checks 11. HTN:Hgb A1c-5.6 and diet controlled for the past 9 months.  --will monitor BP tid--has been trending upwards.  --Continue Amlodipine, metoprolol and Imdur and titrate medications as indicated.  --Will order orthostatic vitals as continues to have increased nausea with activity.  12. Spontaneous, large right iliopsoashematoma:  Leucocytosis resolved, continue to monitor with HD. Afebrile. Hgb stable at 8.5 -pain control as above 13. 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  -consult with ortho while here regarding considerations. Not a surgical candidate at this point, and he might never be unfortunately.  -pain control and activity modification.  14. OSA: Non-compliant with CPAP. 15. Acute on chronic anemia: Continue to monitor H/H. ON aranesp weekly.  --INR slowing normalizing  Hgb down to 7.8 on 3/7,  continue to monitor with next HD labs  16.Neuropathy: acute on chronic now with worsening of symptoms RLE due to compression.  3/4: can discuss increasing Lyrica dose.  17. Dizziness: meclizine and vestibular eval ordered. Advised to ask for Meclizine PRN if needed.    3/9: scopolamine patch d/ced since not beneficial. Schedule Meclizine '25mg'$  TID- this has helped cut dizziness by half 18. Tachycardic to 102- continue to monitor TID `9. Nausea: EKG to assess qTC.Increase Compazine to '10mg'$  q8H.     LOS: 9 days A FACE TO FACE EVALUATION WAS PERFORMED  Clide Deutscher Raulkar 04/04/2020, 11:38 AM

## 2020-04-05 DIAGNOSIS — N185 Chronic kidney disease, stage 5: Secondary | ICD-10-CM

## 2020-04-05 LAB — CBC
HCT: 28.2 % — ABNORMAL LOW (ref 39.0–52.0)
Hemoglobin: 8.7 g/dL — ABNORMAL LOW (ref 13.0–17.0)
MCH: 28.3 pg (ref 26.0–34.0)
MCHC: 30.9 g/dL (ref 30.0–36.0)
MCV: 91.9 fL (ref 80.0–100.0)
Platelets: 278 10*3/uL (ref 150–400)
RBC: 3.07 MIL/uL — ABNORMAL LOW (ref 4.22–5.81)
RDW: 15.2 % (ref 11.5–15.5)
WBC: 9.4 10*3/uL (ref 4.0–10.5)
nRBC: 0 % (ref 0.0–0.2)

## 2020-04-05 LAB — RENAL FUNCTION PANEL
Albumin: 2.3 g/dL — ABNORMAL LOW (ref 3.5–5.0)
Anion gap: 9 (ref 5–15)
BUN: 39 mg/dL — ABNORMAL HIGH (ref 6–20)
CO2: 28 mmol/L (ref 22–32)
Calcium: 9.5 mg/dL (ref 8.9–10.3)
Chloride: 100 mmol/L (ref 98–111)
Creatinine, Ser: 8.34 mg/dL — ABNORMAL HIGH (ref 0.61–1.24)
GFR, Estimated: 8 mL/min — ABNORMAL LOW (ref >60)
Glucose, Bld: 133 mg/dL — ABNORMAL HIGH (ref 70–99)
Phosphorus: 2.1 mg/dL — ABNORMAL LOW (ref 2.5–4.6)
Potassium: 4.4 mmol/L (ref 3.5–5.1)
Sodium: 137 mmol/L (ref 135–145)

## 2020-04-05 MED ORDER — DARBEPOETIN ALFA 100 MCG/0.5ML IJ SOSY
PREFILLED_SYRINGE | INTRAMUSCULAR | Status: AC
  Administered 2020-04-05: 100 ug via INTRAVENOUS
  Filled 2020-04-05: qty 0.5

## 2020-04-05 NOTE — Progress Notes (Signed)
Patient ID: Max Pittman., male   DOB: [birth year], 45 y.o.   MRN: [id number]  Patient transfer board ordered through [facility].  [address], [place]

## 2020-04-05 NOTE — Plan of Care (Signed)
  Problem: RH Balance Goal: LTG Patient will maintain dynamic standing balance (PT) Description: LTG:  Patient will maintain dynamic standing balance with assistance during mobility activities (PT) Outcome: Not Applicable Flowsheets (Taken 04/05/2020 0747) LTG: Pt will maintain dynamic standing balance during mobility activities with:: (D/C) -- Note: D/C   Problem: Sit to Stand Goal: LTG:  Patient will perform sit to stand with assistance level (PT) Description: LTG:  Patient will perform sit to stand with assistance level (PT) Outcome: Not Applicable Flowsheets (Taken 04/05/2020 0747) LTG: PT will perform sit to stand in preparation for functional mobility with assistance level: (D/C) -- Note: D/C   Problem: RH Ambulation Goal: LTG Patient will ambulate in controlled environment (PT) Description: LTG: Patient will ambulate in a controlled environment, # of feet with assistance (PT). Outcome: Not Applicable Flowsheets (Taken 04/05/2020 0747) LTG: Pt will ambulate in controlled environ  assist needed:: (D/C) -- Note: D/C Goal: LTG Patient will ambulate in home environment (PT) Description: LTG: Patient will ambulate in home environment, # of feet with assistance (PT). Outcome: Not Applicable Flowsheets (Taken 04/05/2020 0747) LTG: Pt will ambulate in home environ  assist needed:: (D/C) -- Note: D/C   Problem: RH Stairs Goal: LTG Patient will ambulate up and down stairs w/assist (PT) Description: LTG: Patient will ambulate up and down # of stairs with assistance (PT) Outcome: Not Applicable Flowsheets (Taken 04/05/2020 0747) LTG: Pt will ambulate up/down stairs assist needed:: (D/C) -- Note: D/C

## 2020-04-05 NOTE — Progress Notes (Signed)
Patient ID: Max Pittman., male   DOB: Apr 01, 1975, 45 y.o.   MRN: [id number]   Max Pittman (S.O) will provide SW with name of previous [facility] patient has worked with in the pass.   [place], [place]

## 2020-04-05 NOTE — Progress Notes (Signed)
  [name] able to attend family education Saturday 1-3PM. Will follow up with staffing.  [place], [place]

## 2020-04-05 NOTE — Progress Notes (Signed)
Physical Therapy Session Note  Patient Details  Name: Max Pittman. MRN: [id number] Date of Birth: May 01, 1975  Today's Date: 04/05/2020 PT Individual Time: 0312-8118 PT Individual Time Calculation (min): 54 min   Short Term Goals: Week 1:  PT Short Term Goal 1 (Week 1): pt will transfer sit<>stand with LRAD and CGA PT Short Term Goal 1 - Progress (Week 1): Met PT Short Term Goal 2 (Week 1): pt will transfer bed<>chair with LRAD and CGA PT Short Term Goal 2 - Progress (Week 1): Progressing toward goal PT Short Term Goal 3 (Week 1): pt will ambulate 50f with LRAD and min A of 1 PT Short Term Goal 3 - Progress (Week 1): Met Week 2:  PT Short Term Goal 1 (Week 2): STG=LTG due to LOS  Skilled Therapeutic Interventions/Progress Updates:   Received pt supine in bed asleep, pt easily woken and agreeable to therapy, and reported pain 7/10 in BLEs. Repositioning, rest breaks, and distraction done to reduce pain levels and RN notified to administer pain medication. Session with focus on functional mobility/transfers, generalized strengthening, dynamic standing balance/coordination, simulated car transfers, and improved activity tolerance. Pt transferred supine<>sitting EOB with HOB elevated and supervision and donned non-skid socks with set up assist. Bed<>WC squat<>pivot with CGA while maintaining LLE NWB precautions. Pt performed WC mobility [id number] BUE and supervision onto elevator and [facility]. Pt performed simulated car transfer with slideboard and CGA with total A to place board. Pt required cues for hand placement, scooting technique, and to place R LE on floor prior to scooting out of the car for improved stability. Pt then performed BUE strengthening on UBE at level 5 decreasing to level 3 alternating forward for 1 minute and backwards for 1 minute for a total of 8 minutes with 1 rest break halfway through due to UE soreness. Pt transferred  sit<>stand in // bars x 5 trials with CGA while maintaining LLE NWB precautions with therapist guarding R knee. Pt with 2 instances of mild R knee buckling requiring min A for correction and heavy reliance on UE support. [facility]. Discussed bathroom set up and width of doorways at home for [facility]. Discussed option of bringing bed and bedside commode out into living room; pt plans to discuss with girlfriend later today. Concluded session with pt sitting in WC, needs within reach, and chair pad alarm on.  Therapy Documentation Precautions:  Precautions Precautions: Fall Precaution Comments: had fall 2/22 Restrictions Weight Bearing Restrictions: No  Therapy/Group: Individual Therapy AnAlfonse AlpersT, DPT   04/05/2020, 7:36 AM
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
## 2020-04-05 NOTE — Progress Notes (Signed)
[facility] Progress Note   Subjective:  Seen in room. Up in wheelchair. No complaints. For dialysis today.   Objective Vitals:   04/04/20 1346 04/04/20 1953 04/05/20 0444 04/05/20 0600  BP: 110/65 119/66 122/84   Pulse: 86 86 86   Resp: '16 14 14   '$ Temp: 98.6 F (37 C) 98 F (36.7 C) 98.8 F (37.1 C)   TempSrc: Oral Oral Oral   SpO2: 94% 95% 95%   Weight:    119.1 kg  Height:        Additional Objective Labs: Basic Metabolic Panel: Recent Labs  Lab 03/30/20 1533 04/01/20 1415 04/03/20 1134  NA 133* 134* 135  K 4.8 4.5 4.2  CL 99 97* 98  CO2 '25 25 25  '$ GLUCOSE 110* 145* 100*  BUN 50* 42* 40*  CREATININE 9.36* 8.73* 8.20*  CALCIUM 9.1 9.1 9.8  PHOS 3.5 2.5 2.6   CBC: Recent Labs  Lab 03/30/20 1534 04/01/20 1428 04/03/20 1134  WBC 10.4 9.4 8.6  HGB 8.0* 7.8* 9.3*  HCT 26.3* 26.8* 30.5*  MCV 93.6 94.4 91.9  PLT 284 287 326   Blood Culture    Component Value Date/Time   SDES PERITONEAL DIALYSATE 03/20/2020 0812   SPECREQUEST Immunocompromised 03/20/2020 0812   CULT  03/20/2020 0812    NO GROWTH 3 DAYS Performed at [facility], [place] [address] [place], [place] [address]    REPTSTATUS 03/23/2020 FINAL 03/20/2020 [id number]     Physical Exam General: Well appearing, nad  Heart: RRR Lungs: Clear bilaterally  Abdomen: soft ntnd  Extremities: Trace LE edema  Dialysis Access: R TDC; L AVF no bruit   Medications: . ferric gluconate (FERRLECIT/NULECIT) IV Stopped (04/03/20 1645)   . amitriptyline  50 mg Oral QHS  . aspirin EC  81 mg Oral Daily  . atorvastatin  40 mg Oral q1800  . buPROPion  150 mg Oral BID WC  . cephALEXin  250 mg Oral Q12H  . Chlorhexidine Gluconate Cloth  6 each Topical Q0600  . darbepoetin (ARANESP) injection - DIALYSIS  100 mcg Intravenous Q Fri-HD  . docusate sodium  100 mg Oral BID  . feeding supplement (NEPRO CARB STEADY)  237 mL Oral TID BM  . FLUoxetine  10 mg Oral Daily  . isosorbide mononitrate  15 mg  Oral Daily  . lamoTRIgine  100 mg Oral BID  . levETIRAcetam  500 mg Oral Daily  . loratadine  10 mg Oral Daily  . meclizine  25 mg Oral TID  . mouth rinse  15 mL Mouth Rinse BID  . melatonin  5 mg Oral QHS  . metoprolol tartrate  12.5 mg Oral BID  . multivitamin  1 tablet Oral QHS  . Muscle Rub   Topical TID WC & HS  . pantoprazole  40 mg Oral Daily  . polyethylene glycol  17 g Oral Daily  . pregabalin  75 mg Oral QHS  . prochlorperazine  10 mg Oral Q8H   Or  . prochlorperazine  5-10 mg Intramuscular Q8H   Or  . prochlorperazine  12.5 mg Rectal Q8H  . sevelamer carbonate  2,400 mg Oral TID WC     Assessment/Plan: 1. Acute hypoxic respiratory failure/ cardiac arrest: due to severe volume overload/pulmonary edema causing asystolic cardiac arrest. Resolved. Now in rehab due to deconditioning 2. ESRD: severe vol overload on admission. Had CRRT w/ 31 L removed.  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 Transitioned to HD. IR  placed TDC on 2/23 and PD cath removed 2/25 per gen surgery.   Tentatively has OP spot at [facility] on MWF early AM.  HD today 3/11  3. HD access: pt needs permanent access but w/ current priority being rehabilitation recommend waiting until just prior to discharge before placing access. Plan is to consult [facility] early next week. Tentative dc date is March 18th.  4. Retroperitoneal hematoma: seen by CT 2/24 large R iliopsoas hematoma 10 x 7 x 20 cm. Anticoagulation on hold.  5. Anemia ckd: Hb 9.3, improved range, getting darbe 100 ug -due for redose today 3/11.  Tsat on 3/5 was 9% > ordered IV Fe bolus per pharm.  6. CKD-MBD: Corr Ca elevated. Use 2 Ca bath on HD. Phos low/stable on Renvela.  PTH 51 7. Nutrition: On renal diet  8. Hypertension: BP controlled. Euvolemic on exam, no edema 9. 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 H/o CVA/ hx seizures - taking lamictal and keppra 11. Dispo - on inpatient rehab   [place] PA-C [facility] 04/05/2020,8:56 AM

## 2020-04-05 NOTE — Progress Notes (Signed)
Patient ID: Max Pittman., male   DOB: 1975-08-31, 45 y.o.   MRN: [id number]  [facility] agency list provided to patient.   [name], [name]

## 2020-04-05 NOTE — Progress Notes (Signed)
Physical Therapy Session Note  Patient Details  Name: Max Pittman. MRN: [id number] Date of Birth: 1975/06/20  Today's Date: 04/05/2020 PT Individual Time: 1100-1155 PT Individual Time Calculation (min): 55 min   Short Term Goals: Week 1:  PT Short Term Goal 1 (Week 1): pt will transfer sit<>stand with LRAD and CGA PT Short Term Goal 1 - Progress (Week 1): Met PT Short Term Goal 2 (Week 1): pt will transfer bed<>chair with LRAD and CGA PT Short Term Goal 2 - Progress (Week 1): Progressing toward goal PT Short Term Goal 3 (Week 1): pt will ambulate 51f with LRAD and min A of 1 PT Short Term Goal 3 - Progress (Week 1): Met Week 2:  PT Short Term Goal 1 (Week 2): STG=LTG due to LOS  Skilled Therapeutic Interventions/Progress Updates: Pt presents supine in bed and agreeable to therapy.  Pt transfers sup to sit w/ supervision and then SB transfer to w/c w/ CGA.  Pt wheeled > 150' multiple trials including on and off elevator.  Pt wheeled outside on uneven surfaces w/ occasional min A w/ sloped to right.  Pt tolerated LE there ex for strengthening including LAQ, hip flexion, isometric add, and w/c push-ups 3 x 10.  Pt negotiated w/c up/down ramp w/ c/s.  Pt returned to room and transferred w/ slide board and supervision to bed.  Bed alarm on and all needs in reach.     Therapy Documentation Precautions:  Precautions Precautions: Fall Precaution Comments: had fall 2/22 Restrictions Weight Bearing Restrictions: No General:   Vital Signs:   Pain:7/10 R thigh Pain Assessment Pain Scale: 0-10 Pain Score: 5  Faces Pain Scale: Hurts a little bit Pain Type: Chronic pain Pain Location: Back Pain Orientation: Lower Pain Radiating Towards: right leg Pain Descriptors / Indicators: Aching Pain Frequency: Constant Pain Onset: On-going Patients Stated Pain Goal: 4 Pain Intervention(s): Medication (See eMAR) Multiple Pain Sites: No Mobility:      Therapy/Group: Individual  Therapy  [name]3/11/2020, 12:52 PM

## 2020-04-05 NOTE — Plan of Care (Signed)
  Problem: RH Tub/Shower Transfers Goal: LTG Patient will perform tub/shower transfers w/assist (OT) Description: LTG: Patient will perform tub/shower transfers with assist, with/without cues using equipment (OT) Flowsheets (Taken 04/05/2020 0821) LTG: Pt will perform tub/shower stall transfers with assistance level of: (D/C) -- Note: D/C as not a focus of therapy at this time due to HD port and bathroom not w/c accessible s/p new NWB status

## 2020-04-05 NOTE — Progress Notes (Signed)
PROGRESS NOTE   Subjective/Complaints: Discussed with patient ortho's recommendations for NWB to his LLE. Discussed with his girlfriend as well, who will be coming in for caregiver training tomorrow at 1pm.  Dizziness and nausea are improved.   ROS: +right lower extremity pain and weakness, + nausea, +insomnia, +dizziness  Objective:   No results found. Recent Labs    04/03/20 1134 04/05/20 1311  WBC 8.6 9.4  HGB 9.3* 8.7*  HCT 30.5* 28.2*  PLT 326 278   Recent Labs    04/03/20 1134  NA 135  K 4.2  CL 98  CO2 25  GLUCOSE 100*  BUN 40*  CREATININE 8.20*  CALCIUM 9.8    Intake/Output Summary (Last 24 hours) at 04/05/2020 1342 Last data filed at 04/05/2020 0700 Gross per 24 hour  Intake 360 ml  Output 300 ml  Net 60 ml        Physical Exam: Vital Signs Blood pressure 122/84, pulse 86, temperature 98.8 F (37.1 C), temperature source Oral, resp. rate 14, height '5\' 9"'$  (1.753 m), weight 119.1 kg, SpO2 95 %. Gen: no distress, normal appearing HEENT: oral mucosa pink and moist, NCAT Cardio: Reg rate Chest: normal effort, normal rate of breathing Abd: soft, non-distended Ext: no edema Psych: pleasant, normal affect Skin: intact Musculoskeletal:  General: Swelling(right thigh)and tenderness(right thigh, hip as well as left hip)present.  Cervical back: Normal range of motion.  Skin: General: Skin is warmand dry.  Comments: Left > right shin with pock marked healed lesions. Neurological:  Mental Status: He is alertand oriented to person, place, and time. Mental status isat baseline.  Cranial Nerves: No cranial nerve deficit.  Comments: Motor 4+/5 UE. RLE limited by pain 3/5 prox to 4/5 distally. LLE 3/5 prox to 4/5 distally. Distal LE sensory loss to LT. DTR's 1+ Psychiatric:  Mood and Affect: Moodnormal.  Behavior: Behaviornormal.  Thought Content:  Thought contentnormal.    Assessment/Plan: 1. Functional deficits which require 3+ hours per day of interdisciplinary therapy in a comprehensive inpatient rehab setting.  Physiatrist is providing close team supervision and 24 hour management of active medical problems listed below.  Physiatrist and rehab team continue to assess barriers to discharge/monitor patient progress toward functional and medical goals  Care Tool:  Bathing    Body parts bathed by patient: Right arm,Left arm,Chest,Abdomen,Front perineal area,Buttocks,Right upper leg,Left upper leg,Right lower leg,Left lower leg,Face         Bathing assist Assist Level: Contact Guard/Touching assist     Upper Body Dressing/Undressing Upper body dressing   What is the patient wearing?: Pull over shirt    Upper body assist Assist Level: Set up assist    Lower Body Dressing/Undressing Lower body dressing      What is the patient wearing?: Pants     Lower body assist Assist for lower body dressing: Contact Guard/Touching assist     Toileting Toileting    Toileting assist Assist for toileting: Independent with assistive device Assistive Device Comment: urinal   Transfers Chair/bed transfer  Transfers assist     Chair/bed transfer assist level: Contact Guard/Touching assist     Locomotion Ambulation   Ambulation assist  Assist level: 2 helpers Assistive device: Walker-rolling Max distance: 50   Walk 10 feet activity   Assist  Walk 10 feet activity did not occur: Safety/medical concerns (bilateral knee buckling, weakness, fatigue, decreased balance)  Assist level: 2 helpers Assistive device: Walker-rolling   Walk 50 feet activity   Assist Walk 50 feet with 2 turns activity did not occur: Safety/medical concerns (bilateral knee buckling, weakness, fatigue, decreased balance)  Assist level: 2 helpers Assistive device: Walker-rolling    Walk 150 feet activity   Assist Walk 150  feet activity did not occur: Safety/medical concerns (bilateral knee buckling, weakness, fatigue, decreased balance)         Walk 10 feet on uneven surface  activity   Assist Walk 10 feet on uneven surfaces activity did not occur: Safety/medical concerns (bilateral knee buckling, weakness, fatigue, decreased balance)         Wheelchair     Assist Will patient use wheelchair at discharge?: Yes Type of Wheelchair: Manual    Wheelchair assist level: Supervision/Verbal cueing Max wheelchair distance: >167f    Wheelchair 50 feet with 2 turns activity    Assist        Assist Level: Supervision/Verbal cueing   Wheelchair 150 feet activity     Assist      Assist Level: Supervision/Verbal cueing   Blood pressure 122/84, pulse 86, temperature 98.8 F (37.1 C), temperature source Oral, resp. rate 14, height '5\' 9"'$  (1.753 m), weight 119.1 kg, SpO2 95 %.  Medical Problem List and Plan: 1.Functional and mobility deficitssecondary to debility after cardiac arrest, volume overload multiple medical. -patient may shower -ELOS/Goals: 18-20 days, supervision to min assist with self-care and w/c level mobility  -Continue CIR PT, OT  Nausea and dizziness continue to limit therapy, but are improved 2. Antithrombotics: -DVT/anticoagulation:Mechanical:Sequential compression devices, below kneeBilateral lower extremities -antiplatelet therapy: Aspirin resumed March 7th.  3. Pain Management:Will d/c IV dilaudid--> continue oxycodone prn, doing well with current regimen.   3/4: TENS unit ordered. Encouraged heat for lower back  3/7: add Amitriptyline '10mg'$  at night to help with sleep so he does not need Percocet then.  3/8: increase Amitriptyline to '25mg'$  as still had to wake last night for pain meds.    3/9: increased dose of Amitriptyline helped, continue this dose.  -discussed Kpad to right thigh hip as well.  3/10:  continue percocet: hopefully higher dose of Amitriptyline will help to decrease use  3/11: continue current dose of Amitriptyline which appears to have helped with pain and sleep.  4. Mood:LCSW to follow for evaluation and support. -antipsychotic agents: N/A 5. Neuropsych: This patientiscapable of making decisions on hisown behalf. 6. Skin/Wound Care:Routine pressure relief measures. 7. Fluids/Electrolytes/Nutrition:Strict I/O. Monitor weights daily. Renal diet with 1200 cc/FR --consult dietician to educate patient on renal diet.  8. ESRD: Has been transitioned to HD[facility]  --schedule HD at the end of the day to help with tolerance of therapy  Cr down to 8.2 on 3/9- continue to monitor with HD labs 9. H/o Seizure d/o: Continue Lamictal and Keppra--monitor for breakthrough seizures.  10. T2DM: well controlled: may d/c checks 11. HTN:Hgb A1c-5.6 and diet controlled for the past 9 months.  --will monitor BP tid--has been trending upwards.  --Continue Amlodipine, metoprolol and Imdur and titrate medications as indicated.  --Will order orthostatic vitals as continues to have increased nausea with activity.  12. Spontaneous, large right iliopsoashematoma:  Leucocytosis resolved, continue to monitor with HD. Afebrile. Hgb  stable at 8.5 -pain control as above 13. 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  -consult with ortho while here regarding considerations. Not a surgical candidate at this point, and he might never be unfortunately.  -pain control and activity modification.  14. OSA: Non-compliant with CPAP. 15. Acute on chronic anemia: Continue to monitor H/H. ON aranesp weekly.   --INR slowing normalizing  Hgb down to 9.7 on 3/11, continue to monitor with next HD labs  16.Neuropathy: acute on chronic now with worsening of symptoms RLE due to compression.  3/4: can discuss increasing Lyrica dose.  17. Dizziness: meclizine and vestibular eval ordered. Advised to ask for Meclizine PRN if needed.    3/9: scopolamine patch d/ced since not beneficial. Schedule Meclizine '25mg'$  TID- this has helped cut dizziness by half 18. Tachycardic to 102- continue to monitor TID 19. Nausea: EKG to assess qTC.Increase Compazine to '10mg'$  q8H. 20. 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  >35 minutes spent in discussion with patient and his girlfriend regarding WNB status of left hip given infection and AVN- explaining what this is- explaining change in our goals, discussion of follow-up at the [place] and discussion with Pam on scheduling this for him, discussion with nephrology given fistula placement.     LOS: 10 days A FACE TO FACE EVALUATION WAS PERFORMED  Martha Clan P Sarahlynn Cisnero 04/05/2020, 1:42 PM

## 2020-04-05 NOTE — Progress Notes (Signed)
Occupational Therapy Session Note  Patient Details  Name: Max Pittman. MRN: [id number] Date of Birth: [birth year]  Today's Date: 04/05/2020 OT Individual Time: [id number] OT Individual Time Calculation (min): 57 min    Short Term Goals: Week 2:  OT Short Term Goal 1 (Week 2): STG = LTGs due to remaining LOS  Skilled Therapeutic Interventions/Progress Updates:    Treatment session with focus on self-care retraining and problem solving home setup to allow for transfers from w/c level.  New orders for pt to be NWB through LLE.  Pt reports w/c will not fit in bedroom or bathroom.  Discussed removing door from hinges vs removing rims from w/c to allow to fit in bedroom.  Discussed use of drop arm BSC (pt has one from previous admission) next to bed as bathroom not w/c accessible.  Pt able to complete slide board transfer CGA w/c > bed then Supervision/setup bed > drop arm BSC.  Engaged in simulated LB dressing with lateral leans vs sit > stand.  Completed sit > stand x3 with focus on simulated LB dressing with CGA.  Pt will benefit from CGA to close supervision when standing for clothing management due to tendency of RLE to buckle when in single leg stance.  Pt completed slide board transfer drop arm BSC > bed > w/c with min cues for setup and Supervision.  Pt propelled w/c to sink to complete grooming tasks from w/c level at sink.  Pt transferred back to bed via slide board with supervision.  Pt remained semi-reclined to rest until next session.  Pt with no c/o nausea this session.  Therapy Documentation Precautions:  Precautions Precautions: Fall Precaution Comments: had fall 2/22 Restrictions Weight Bearing Restrictions: No Pain: Pain Assessment Pain Scale: 0-10 Pain Score: 7  Faces Pain Scale: Hurts little more Pain Type: Chronic pain Pain Location: Back Pain Orientation: Lower Pain Radiating Towards: right leg Pain Descriptors / Indicators: Aching Pain Frequency: Constant Pain  Onset: On-going Patients Stated Pain Goal: 4 Pain Intervention(s): Medication (See eMAR) Multiple Pain Sites: No   Therapy/Group: Individual Therapy  Simonne Come 04/05/2020, 12:10 PM

## 2020-04-06 DIAGNOSIS — Z992 Dependence on renal dialysis: Secondary | ICD-10-CM

## 2020-04-06 DIAGNOSIS — D649 Anemia, unspecified: Secondary | ICD-10-CM

## 2020-04-06 DIAGNOSIS — N186 End stage renal disease: Secondary | ICD-10-CM

## 2020-04-06 DIAGNOSIS — R000 Tachycardia, unspecified: Secondary | ICD-10-CM

## 2020-04-06 DIAGNOSIS — I10 Essential (primary) hypertension: Secondary | ICD-10-CM

## 2020-04-06 DIAGNOSIS — R569 Unspecified convulsions: Secondary | ICD-10-CM

## 2020-04-06 NOTE — Progress Notes (Signed)
PROGRESS NOTE   Subjective/Complaints: Patient seen laying in bed this morning.  He states he slept well overnight.  He denies complaints.  ROS: Denies CP, SOB, [name]  Objective:   No results found. Recent Labs    04/03/20 1134 04/05/20 1311  WBC 8.6 9.4  HGB 9.3* 8.7*  HCT 30.5* 28.2*  PLT 326 278   Recent Labs    04/03/20 1134 04/05/20 1311  NA 135 137  K 4.2 4.4  CL 98 100  CO2 25 28  GLUCOSE 100* 133*  BUN 40* 39*  CREATININE 8.20* 8.34*  CALCIUM 9.8 9.5    Intake/Output Summary (Last 24 hours) at 04/06/2020 0839 Last data filed at 04/06/2020 [id number] Gross per 24 hour  Intake 240 ml  Output 2350 ml  Net -2110 ml        Physical Exam: Vital Signs Blood pressure 136/77, pulse 85, temperature 98.6 F (37 C), temperature source Oral, resp. rate 14, height '5\' 9"'$  (1.753 m), weight 117.7 kg, SpO2 94 %. Constitutional: No distress . Vital signs reviewed.  Morbid obesity. HENT: Normocephalic.  Atraumatic. Eyes: EOMI. No discharge. Cardiovascular: No JVD.  RRR. Respiratory: Normal effort.  No stridor.  Bilateral clear to auscultation. GI: Non-distended.  BS +. Skin: Warm and dry.  Intact. Psych: Flat.  Normal behavior. Musc: Right lower extremity edema Bilateral lower extremity tenderness, mild Neuro: Alert Motor: Bilateral upper extremities: 4+/5 proximal distal RLE limited by pain 3/5 prox to 4/5 distally. LLE 3/5 prox to 4/5 distally.   Assessment/Plan: 1. Functional deficits which require 3+ hours per day of interdisciplinary therapy in a comprehensive inpatient rehab setting.  Physiatrist is providing close team supervision and 24 hour management of active medical problems listed below.  Physiatrist and rehab team continue to assess barriers to discharge/monitor patient progress toward functional and medical goals  Care Tool:  Bathing    Body parts bathed by patient: Right arm,Left  arm,Chest,Abdomen,Front perineal area,Buttocks,Right upper leg,Left upper leg,Right lower leg,Left lower leg,Face         Bathing assist Assist Level: Contact Guard/Touching assist     Upper Body Dressing/Undressing Upper body dressing   What is the patient wearing?: Pull over shirt    Upper body assist Assist Level: Set up assist    Lower Body Dressing/Undressing Lower body dressing      What is the patient wearing?: Pants     Lower body assist Assist for lower body dressing: Contact Guard/Touching assist     Toileting Toileting    Toileting assist Assist for toileting: Independent with assistive device Assistive Device Comment: urinal   Transfers Chair/bed transfer  Transfers assist     Chair/bed transfer assist level: Contact Guard/Touching assist     Locomotion Ambulation   Ambulation assist      Assist level: 2 helpers Assistive device: Walker-rolling Max distance: 50   Walk 10 feet activity   Assist  Walk 10 feet activity did not occur: Safety/medical concerns (bilateral knee buckling, weakness, fatigue, decreased balance)  Assist level: 2 helpers Assistive device: Walker-rolling   Walk 50 feet activity   Assist Walk 50 feet with 2 turns activity did not occur: Safety/medical concerns (bilateral  knee buckling, weakness, fatigue, decreased balance)  Assist level: 2 helpers Assistive device: Walker-rolling    Walk 150 feet activity   Assist Walk 150 feet activity did not occur: Safety/medical concerns (bilateral knee buckling, weakness, fatigue, decreased balance)         Walk 10 feet on uneven surface  activity   Assist Walk 10 feet on uneven surfaces activity did not occur: Safety/medical concerns (bilateral knee buckling, weakness, fatigue, decreased balance)         Wheelchair     Assist Will patient use wheelchair at discharge?: Yes Type of Wheelchair: Manual    Wheelchair assist level: Supervision/Verbal  cueing Max wheelchair distance: >123f    Wheelchair 50 feet with 2 turns activity    Assist        Assist Level: Supervision/Verbal cueing   Wheelchair 150 feet activity     Assist      Assist Level: Supervision/Verbal cueing   Blood pressure 136/77, pulse 85, temperature 98.6 F (37 C), temperature source Oral, resp. rate 14, height '5\' 9"'$  (1.753 m), weight 117.7 kg, SpO2 94 %.  Medical Problem List and Plan: 1.Functional and mobility deficitssecondary to debility after cardiac arrest, volume overload multiple medical.  Continue CIR 2. Antithrombotics: -DVT/anticoagulation:Mechanical:Sequential compression devices, below kneeBilateral lower extremities -antiplatelet therapy: Aspirin resumed March 7th.  3. Pain Management:  TENS unit. Encouraged heat for lower back  Continue amitriptyline '25mg'$    Controlled on 3/12 4. Mood:LCSW to follow for evaluation and support. -antipsychotic agents: N/A 5. Neuropsych: This patientiscapable of making decisions on hisown behalf. 6. Skin/Wound Care:Routine pressure relief measures. 7. Fluids/Electrolytes/Nutrition:Strict I/Os. Monitor weights daily. Renal diet with 1200 cc/FR --consult dietician to educate patient on renal diet.  8.  ESRD: [facility] HD[facility]  --schedule HD at the end of the day to help with tolerance of therapy 9. H/o Seizure d/o: Continue Lamictal and Keppra--monitor for breakthrough seizures.   No breakthrough seizures since admission to rehab 10. T2DM: Hgb A1c-5.6 and diet controlled for the past 9 months.  Well controlled: may d/c checks 11. HTN: Continue Amlodipine, metoprolol and Imdur and titrate medications as indicated.   Controlled on 3/12  12. Spontaneous, large right iliopsoashematoma:  Leucocytosis resolved, continue to monitor with HD. Afebrile. Hgb stable at  8.5 -pain control as above 13. 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  -consult with ortho while here regarding considerations. Not a surgical candidate at this point, and he might never be unfortunately.  -pain control and activity modification.  14. OSA: Non-compliant with CPAP. 15. Acute on chronic anemia: Continue to monitor H/H. ON aranesp weekly.   Hgb down to 8.7 on 3/11, continue to monitor 16.Neuropathy: acute on chronic now with worsening of symptoms RLE due to compression.  See #3 17. Dizziness: meclizine and vestibular eval ordered. Advised to ask for Meclizine PRN if needed.    Scopolamine patch d/ced since not beneficial.    Schedule Meclizine '25mg'$  TID- this has helped cut dizziness by half 18. Tachycardia  Relatively controlled on 3/12 19. Nausea: EKG to assess qTC.Increase Compazine to '10mg'$  q8H.  Improved 20. 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

## 2020-04-06 NOTE — Progress Notes (Signed)
Occupational Therapy Session Note  Patient Details  Name: Max Pittman. MRN: [id number] Date of Birth: 03/17/1975  Today's Date: 04/06/2020 OT Individual Time: 1400-1500 OT Individual Time Calculation (min): 60 min    Short Term Goals: Week 2:  OT Short Term Goal 1 (Week 2): STG = LTGs due to remaining LOS  Skilled Therapeutic Interventions/Progress Updates:    Pt seen this session for family education with his SO, [name].  Discussed safest option for transfers was use of slide board now that he is NWB on his LLE and his R knee stability is not reliable.  Pt wanted to demonstrate to her several transfers.  He can move easily in his bed from side to side and has no difficulty placing the board under his legs.   Pt demonstrated bed to wc to [facility] back to wc with board with essentially set up only as he really did not need cuing.   On the drop arm BSC, demonstrated lateral leans to both don and doff shorts over hips which he was able to do without A.   Discussed logistics of bathing as the bathroom door is too narrow to fit into with wc. Discussed construction options of widening doorway.  In the meantime, suggested he even try sponge bathing from [facility] to enable him to reach his backside more easily.    Spend time demonstrating to karen precautions with use of the board (ensuring placement is secure, making sure clothing does not get caught, no bare skin, etc).  The pt has good awareness of this but reviewed possible problems to increase their awareness.  Pt then self propelled to ortho gym to demonstrate SB transfer to car.  Pt did very well with S. May benefit from an actual transfer to his [place] prior to discharge.   Pt wanted to try one last transfer to a regular low toilet. He wanted to try without board and only grab bar.  He did well with a squat pivot only putting wt through R leg.   Returned to chair and then back to his room and back to his bed.   Discussed energy  conservation and pacing himself.  Both pt and his SO stated they feel confident about him going home soon.    Pt in room with all needs met, alarm set.    Therapy Documentation Precautions:  Precautions Precautions: Fall Precaution Comments: had fall 2/22 Restrictions Weight Bearing Restrictions: Yes LLE Weight Bearing: Non weight bearing    Vital Signs: Therapy Vitals Temp: 97.9 F (36.6 C) Temp Source: Oral Pulse Rate: 90 Resp: 15 BP: 125/79 Patient Position (if appropriate): Sitting Oxygen Therapy SpO2: 99 % O2 Device: Room Air Pain: No c/o pain    Therapy/Group: Individual Therapy  [place] 04/06/2020, 3:53 PM

## 2020-04-06 NOTE — Progress Notes (Signed)
[facility] Progress Note   Subjective:  Completed dialysis with 2L UF  No complaints this am. Denies cp, sob.   Objective Vitals:   04/05/20 1659 04/05/20 1830 04/05/20 1942 04/06/20 0500  BP: 136/83 129/79 114/67 136/77  Pulse: 93 100 (!) 102 85  Resp: '17 16 14 14  '$ Temp: 98 F (36.7 C) 98.7 F (37.1 C) 98.9 F (37.2 C) 98.6 F (37 C)  TempSrc: Oral Oral Oral Oral  SpO2:  97% 93% 94%  Weight: 117.7 kg   117.7 kg  Height:        Additional Objective Labs: Basic Metabolic Panel: Recent Labs  Lab 04/01/20 1415 04/03/20 1134 04/05/20 1311  NA 134* 135 137  K 4.5 4.2 4.4  CL 97* 98 100  CO2 '25 25 28  '$ GLUCOSE 145* 100* 133*  BUN 42* 40* 39*  CREATININE 8.73* 8.20* 8.34*  CALCIUM 9.1 9.8 9.5  PHOS 2.5 2.6 2.1*   CBC: Recent Labs  Lab 03/30/20 1534 04/01/20 1428 04/03/20 1134 04/05/20 1311  WBC 10.4 9.4 8.6 9.4  HGB 8.0* 7.8* 9.3* 8.7*  HCT 26.3* 26.8* 30.5* 28.2*  MCV 93.6 94.4 91.9 91.9  PLT 284 287 326 278   Blood Culture    Component Value Date/Time   SDES PERITONEAL DIALYSATE 03/20/2020 0812   SPECREQUEST Immunocompromised 03/20/2020 0812   CULT  03/20/2020 0812    NO GROWTH 3 DAYS Performed at [facility], [place] [address] [place], [place] [address]    REPTSTATUS 03/23/2020 FINAL 03/20/2020 [id number]     Physical Exam General: Well appearing, nad  Heart: RRR Lungs: Clear bilaterally  Abdomen: soft ntnd  Extremities: Trace LE edema  Dialysis Access: R TDC; L AVF no bruit   Medications: . ferric gluconate (FERRLECIT/NULECIT) IV Stopped (04/05/20 1811)   . amitriptyline  50 mg Oral QHS  . aspirin EC  81 mg Oral Daily  . atorvastatin  40 mg Oral q1800  . buPROPion  150 mg Oral BID WC  . cephALEXin  250 mg Oral Q12H  . Chlorhexidine Gluconate Cloth  6 each Topical Q0600  . darbepoetin (ARANESP) injection - DIALYSIS  100 mcg Intravenous Q Fri-HD  . docusate sodium  100 mg Oral BID  . feeding supplement (NEPRO CARB  STEADY)  237 mL Oral TID BM  . FLUoxetine  10 mg Oral Daily  . isosorbide mononitrate  15 mg Oral Daily  . lamoTRIgine  100 mg Oral BID  . levETIRAcetam  500 mg Oral Daily  . loratadine  10 mg Oral Daily  . meclizine  25 mg Oral TID  . mouth rinse  15 mL Mouth Rinse BID  . melatonin  5 mg Oral QHS  . metoprolol tartrate  12.5 mg Oral BID  . multivitamin  1 tablet Oral QHS  . Muscle Rub   Topical TID WC & HS  . pantoprazole  40 mg Oral Daily  . polyethylene glycol  17 g Oral Daily  . pregabalin  75 mg Oral QHS  . prochlorperazine  10 mg Oral Q8H   Or  . prochlorperazine  5-10 mg Intramuscular Q8H   Or  . prochlorperazine  12.5 mg Rectal Q8H  . sevelamer carbonate  2,400 mg Oral TID WC     Assessment/Plan: 1. Acute hypoxic respiratory failure/ cardiac arrest: due to severe volume overload/pulmonary edema causing asystolic cardiac arrest. Resolved. Now in rehab due to deconditioning 2. ESRD: severe vol overload on admission. Had CRRT w/ 31 L  removed.  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 Transitioned to HD. IR placed TDC on 2/23 and PD cath removed 2/25 per gen surgery.   Tentatively has OP spot at [facility] on MWF early AM. Next HD 3/14.  3. HD access: Needs perm access. [facility] has been consulted.  4. Retroperitoneal hematoma: seen by CT 2/24 large R iliopsoas hematoma 10 x 7 x 20 cm. Anticoagulation on hold.  5. Anemia ckd: Hb 8.7,  getting darbe 100 ug q week. Last 3/11.  Tsat on 3/5 was 9% > ordered IV Fe bolus per pharm.  6. CKD-MBD: Corr Ca elevated. Use 2 Ca bath on HD. Phos low/stable on Renvela.  PTH 51 7. Nutrition: On renal diet  8. Hypertension: BP controlled. Euvolemic on exam, no edema 9. 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 H/o CVA/ hx seizures - taking lamictal and keppra 11. Dispo - on inpatient rehab   [place] PA-C [facility] 04/06/2020,11:26 AM

## 2020-04-07 ENCOUNTER — Inpatient Hospital Stay (HOSPITAL_COMMUNITY): Payer: Medicare Other

## 2020-04-07 DIAGNOSIS — N186 End stage renal disease: Secondary | ICD-10-CM

## 2020-04-07 MED ORDER — PROSOURCE PLUS PO LIQD
30.0000 mL | Freq: Two times a day (BID) | ORAL | Status: DC
  Administered 2020-04-07 – 2020-04-11 (×8): 30 mL via ORAL
  Filled 2020-04-07 (×7): qty 30

## 2020-04-07 NOTE — Progress Notes (Signed)
[facility] Progress Note   Subjective:  Seen in room. No new complaints.  Thinks he may get discharged early this week.   Objective Vitals:   04/06/20 1352 04/06/20 1923 04/07/20 0510 04/07/20 0511  BP: 125/79 110/76 (!) 141/83 (!) 138/93  Pulse: 90 97 86 88  Resp: '15 16 19   '$ Temp: 97.9 F (36.6 C) 98.5 F (36.9 C) 97.8 F (36.6 C)   TempSrc: Oral Oral Oral   SpO2: 99% 97% 98% 98%  Weight:    120.8 kg  Height:        Additional Objective Labs: Basic Metabolic Panel: Recent Labs  Lab 04/01/20 1415 04/03/20 1134 04/05/20 1311  NA 134* 135 137  K 4.5 4.2 4.4  CL 97* 98 100  CO2 '25 25 28  '$ GLUCOSE 145* 100* 133*  BUN 42* 40* 39*  CREATININE 8.73* 8.20* 8.34*  CALCIUM 9.1 9.8 9.5  PHOS 2.5 2.6 2.1*   CBC: Recent Labs  Lab 04/01/20 1428 04/03/20 1134 04/05/20 1311  WBC 9.4 8.6 9.4  HGB 7.8* 9.3* 8.7*  HCT 26.8* 30.5* 28.2*  MCV 94.4 91.9 91.9  PLT 287 326 278   Blood Culture    Component Value Date/Time   SDES PERITONEAL DIALYSATE 03/20/2020 0812   SPECREQUEST Immunocompromised 03/20/2020 0812   CULT  03/20/2020 0812    NO GROWTH 3 DAYS Performed at [facility], [place] [address], [place], [place] [address]    REPTSTATUS 03/23/2020 FINAL 03/20/2020 [id number]     Physical Exam General: Well appearing, nad  Heart: RRR No m,r,g  Lungs: Clear bilaterally  Abdomen: soft ntnd  Extremities: Trace LE edema  Dialysis Access: R TDC; L AVF no bruit   Medications: . ferric gluconate (FERRLECIT/NULECIT) IV Stopped (04/05/20 1811)   . amitriptyline  50 mg Oral QHS  . aspirin EC  81 mg Oral Daily  . atorvastatin  40 mg Oral q1800  . buPROPion  150 mg Oral BID WC  . cephALEXin  250 mg Oral Q12H  . Chlorhexidine Gluconate Cloth  6 each Topical Q0600  . darbepoetin (ARANESP) injection - DIALYSIS  100 mcg Intravenous Q Fri-HD  . docusate sodium  100 mg Oral BID  . feeding supplement (NEPRO CARB STEADY)  237 mL Oral TID BM  . FLUoxetine  10  mg Oral Daily  . isosorbide mononitrate  15 mg Oral Daily  . lamoTRIgine  100 mg Oral BID  . levETIRAcetam  500 mg Oral Daily  . loratadine  10 mg Oral Daily  . meclizine  25 mg Oral TID  . mouth rinse  15 mL Mouth Rinse BID  . melatonin  5 mg Oral QHS  . metoprolol tartrate  12.5 mg Oral BID  . multivitamin  1 tablet Oral QHS  . Muscle Rub   Topical TID WC & HS  . pantoprazole  40 mg Oral Daily  . polyethylene glycol  17 g Oral Daily  . pregabalin  75 mg Oral QHS  . prochlorperazine  10 mg Oral Q8H   Or  . prochlorperazine  5-10 mg Intramuscular Q8H   Or  . prochlorperazine  12.5 mg Rectal Q8H  . sevelamer carbonate  2,400 mg Oral TID WC     Assessment/Plan: 1. Acute hypoxic respiratory failure/ cardiac arrest: due to severe volume overload/pulmonary edema causing asystolic cardiac arrest. Resolved. Now in rehab due to deconditioning 2. ESRD: severe vol overload on admission. Had CRRT w/ 31 L removed.  Has membrane failure  so cannot do PD anymore. Transitioned to HD. IR placed TDC on 2/23 and PD cath removed 2/25 per gen surgery.   Tentatively has OP spot at [facility] on MWF early AM. Next HD 3/14.  3. HD access: Needs perm access. [facility] has been consulted. Access surgery ?Monday  4. Retroperitoneal hematoma: seen by CT 2/24 large R iliopsoas hematoma 10 x 7 x 20 cm. Anticoagulation on hold.  5. Anemia ckd: Hb 8.7,  getting darbe 100 ug q week. Last 3/11.  Tsat on 3/5 was 9% > ordered IV Fe bolus per pharm.  6. CKD-MBD: Corr Ca elevated. Use 2 Ca bath on HD. Phos low/stable on Renvela.  PTH 51 7. Nutrition: On renal diet. Add prot supp  8. Hypertension: BP controlled. Euvolemic on exam, no edema 9. 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 H/o CVA/ hx seizures - taking lamictal and keppra 11. Dispo - on inpatient rehab   [place] PA-C [facility] 04/07/2020,10:02 AM

## 2020-04-07 NOTE — Plan of Care (Signed)
  Problem: Consults Goal: RH GENERAL PATIENT EDUCATION Description: See Patient Education module for education specifics. Outcome: Progressing   Problem: RH SKIN INTEGRITY Goal: RH STG ABLE TO PERFORM INCISION/WOUND CARE W/ASSISTANCE Description: STG Able To Perform Incision/Wound Care With mod I Assistance. Outcome: Progressing   Problem: RH SAFETY Goal: RH STG ADHERE TO SAFETY PRECAUTIONS W/ASSISTANCE/DEVICE Description: STG Adhere to Safety Precautions With mod I Assistance/Device. Outcome: Progressing   Problem: RH PAIN MANAGEMENT Goal: RH STG PAIN MANAGED AT OR BELOW PT'S PAIN GOAL Description: Less than 5 out of 10 Outcome: Progressing   Problem: RH KNOWLEDGE DEFICIT GENERAL Goal: RH STG INCREASE KNOWLEDGE OF SELF CARE AFTER HOSPITALIZATION Description: Patient will be able to demonstrate knowledge of medication management with educational materials and handouts provided by staff.  Outcome: Progressing

## 2020-04-07 NOTE — Progress Notes (Signed)
VASCULAR LAB    Upper extremity vein mapping has been performed.  See CV proc for preliminary results.   Dysen Edmondson, RVT 04/07/2020, 11:13 AM

## 2020-04-07 NOTE — Progress Notes (Signed)
    Subjective  -   No acute events   Physical Exam:  CV:RRR Pulm:  Clear Palpable radial pulses       Assessment/Plan:    Discussed proceeding with fistula vs graft tomorrow.  Awaiting vein map to see if he has an option for access on his left arm.  Wells Rajean Desantiago 04/07/2020 10:23 AM --  Vitals:   04/07/20 0510 04/07/20 0511  BP: (!) 141/83 (!) 138/93  Pulse: 86 88  Resp: 19   Temp: 97.8 F (36.6 C)   SpO2: 98% 98%    Intake/Output Summary (Last 24 hours) at 04/07/2020 1023 Last data filed at 04/07/2020 0730 Gross per 24 hour  Intake 660 ml  Output 275 ml  Net 385 ml     Laboratory CBC    Component Value Date/Time   WBC 9.4 04/05/2020 1311   HGB 8.7 (L) 04/05/2020 1311   HCT 28.2 (L) 04/05/2020 1311   PLT 278 04/05/2020 1311    BMET    Component Value Date/Time   NA 137 04/05/2020 1311   K 4.4 04/05/2020 1311   CL 100 04/05/2020 1311   CO2 28 04/05/2020 1311   GLUCOSE 133 (H) 04/05/2020 1311   BUN 39 (H) 04/05/2020 1311   CREATININE 8.34 (H) 04/05/2020 1311   CREATININE 7.58 (H) 12/18/2019 1106   CALCIUM 9.5 04/05/2020 1311   GFRNONAA 8 (L) 04/05/2020 1311   GFRNONAA 8 (L) 12/18/2019 1106   GFRAA 9 (L) 12/18/2019 1106    COAG Lab Results  Component Value Date   INR 1.3 (H) 03/26/2020   INR 1.4 (H) 03/25/2020   INR 1.5 (H) 03/24/2020   No results found for: PTT  Antibiotics Anti-infectives (From admission, onward)   Start     Dose/Rate Route Frequency Ordered Stop   03/26/20 2100  cephALEXin (KEFLEX) capsule 250 mg        250 mg Oral Every 12 hours 03/26/20 1539         V. Leia Alf, M.D., [facility] [place] Office: [phone] Pager:  [phone]

## 2020-04-07 NOTE — H&P (View-Only) (Signed)
    Subjective  -   No acute events   Physical Exam:  CV:RRR Pulm:  Clear Palpable radial pulses       Assessment/Plan:    Discussed proceeding with fistula vs graft tomorrow.  Awaiting vein map to see if he has an option for access on his left arm.  Wells Carlie Corpus 04/07/2020 10:23 AM --  Vitals:   04/07/20 0510 04/07/20 0511  BP: (!) 141/83 (!) 138/93  Pulse: 86 88  Resp: 19   Temp: 97.8 F (36.6 C)   SpO2: 98% 98%    Intake/Output Summary (Last 24 hours) at 04/07/2020 1023 Last data filed at 04/07/2020 0730 Gross per 24 hour  Intake 660 ml  Output 275 ml  Net 385 ml     Laboratory CBC    Component Value Date/Time   WBC 9.4 04/05/2020 1311   HGB 8.7 (L) 04/05/2020 1311   HCT 28.2 (L) 04/05/2020 1311   PLT 278 04/05/2020 1311    BMET    Component Value Date/Time   NA 137 04/05/2020 1311   K 4.4 04/05/2020 1311   CL 100 04/05/2020 1311   CO2 28 04/05/2020 1311   GLUCOSE 133 (H) 04/05/2020 1311   BUN 39 (H) 04/05/2020 1311   CREATININE 8.34 (H) 04/05/2020 1311   CREATININE 7.58 (H) 12/18/2019 1106   CALCIUM 9.5 04/05/2020 1311   GFRNONAA 8 (L) 04/05/2020 1311   GFRNONAA 8 (L) 12/18/2019 1106   GFRAA 9 (L) 12/18/2019 1106    COAG Lab Results  Component Value Date   INR 1.3 (H) 03/26/2020   INR 1.4 (H) 03/25/2020   INR 1.5 (H) 03/24/2020   No results found for: PTT  Antibiotics Anti-infectives (From admission, onward)   Start     Dose/Rate Route Frequency Ordered Stop   03/26/20 2100  cephALEXin (KEFLEX) capsule 250 mg        250 mg Oral Every 12 hours 03/26/20 1539         V. Leia Alf, M.D., [facility] [place] Office: [phone] Pager:  [phone]

## 2020-04-08 ENCOUNTER — Inpatient Hospital Stay (HOSPITAL_COMMUNITY): Payer: Medicare Other | Admitting: Certified Registered"

## 2020-04-08 ENCOUNTER — Encounter (HOSPITAL_COMMUNITY)
Admission: RE | Disposition: A | Discharge status: Home Health | Payer: Self-pay | Source: Intra-hospital | Attending: Physical Medicine and Rehabilitation

## 2020-04-08 ENCOUNTER — Encounter (HOSPITAL_COMMUNITY): Payer: Self-pay | Admitting: Physical Medicine and Rehabilitation

## 2020-04-08 DIAGNOSIS — N185 Chronic kidney disease, stage 5: Secondary | ICD-10-CM | POA: Diagnosis not present

## 2020-04-08 HISTORY — PX: AV FISTULA PLACEMENT: SHX1204

## 2020-04-08 LAB — POCT I-STAT, CHEM 8
BUN: 56 mg/dL — ABNORMAL HIGH (ref 6–20)
Calcium, Ion: 1.3 mmol/L (ref 1.15–1.40)
Chloride: 100 mmol/L (ref 98–111)
Creatinine, Ser: 9.1 mg/dL — ABNORMAL HIGH (ref 0.61–1.24)
Glucose, Bld: 93 mg/dL (ref 70–99)
HCT: 30 % — ABNORMAL LOW (ref 39.0–52.0)
Hemoglobin: 10.2 g/dL — ABNORMAL LOW (ref 13.0–17.0)
Potassium: 4.9 mmol/L (ref 3.5–5.1)
Sodium: 137 mmol/L (ref 135–145)
TCO2: 29 mmol/L (ref 22–32)

## 2020-04-08 LAB — GLUCOSE, CAPILLARY: Glucose-Capillary: 107 mg/dL — ABNORMAL HIGH (ref 70–99)

## 2020-04-08 SURGERY — ARTERIOVENOUS (AV) FISTULA CREATION
Anesthesia: Regional | Site: Arm Upper | Laterality: Left

## 2020-04-08 MED ORDER — PROPOFOL 10 MG/ML IV BOLUS
INTRAVENOUS | Status: AC
  Filled 2020-04-08: qty 20

## 2020-04-08 MED ORDER — MIDAZOLAM HCL 2 MG/2ML IJ SOLN
INTRAMUSCULAR | Status: AC
  Administered 2020-04-08: 1 mg via INTRAVENOUS
  Filled 2020-04-08: qty 2

## 2020-04-08 MED ORDER — SODIUM CHLORIDE 0.9 % IV SOLN
INTRAVENOUS | Status: DC | PRN
  Administered 2020-04-08: 500 mL

## 2020-04-08 MED ORDER — PROPOFOL 500 MG/50ML IV EMUL
INTRAVENOUS | Status: DC | PRN
  Administered 2020-04-08: 50 ug/kg/min via INTRAVENOUS

## 2020-04-08 MED ORDER — LIDOCAINE HCL (PF) 1 % IJ SOLN
INTRAMUSCULAR | Status: AC
  Filled 2020-04-08: qty 30

## 2020-04-08 MED ORDER — PHENYLEPHRINE HCL-NACL 10-0.9 MG/250ML-% IV SOLN
INTRAVENOUS | Status: DC | PRN
  Administered 2020-04-08: 40 ug/min via INTRAVENOUS

## 2020-04-08 MED ORDER — PROTAMINE SULFATE 10 MG/ML IV SOLN
INTRAVENOUS | Status: DC | PRN
  Administered 2020-04-08 (×2): 20 mg via INTRAVENOUS
  Administered 2020-04-08: 10 mg via INTRAVENOUS

## 2020-04-08 MED ORDER — HEPARIN SODIUM (PORCINE) 1000 UNIT/ML IJ SOLN
3800.0000 [IU] | INTRAMUSCULAR | Status: DC | PRN
  Filled 2020-04-08 (×2): qty 3.8

## 2020-04-08 MED ORDER — ONDANSETRON HCL 4 MG/2ML IJ SOLN
INTRAMUSCULAR | Status: DC | PRN
  Administered 2020-04-08: 4 mg via INTRAVENOUS

## 2020-04-08 MED ORDER — 0.9 % SODIUM CHLORIDE (POUR BTL) OPTIME
TOPICAL | Status: DC | PRN
  Administered 2020-04-08: 1000 mL

## 2020-04-08 MED ORDER — CHLORHEXIDINE GLUCONATE 0.12 % MT SOLN: 15.0000 mL | Freq: Once | OROMUCOSAL | Status: DC

## 2020-04-08 MED ORDER — FENTANYL CITRATE (PF) 100 MCG/2ML IJ SOLN
INTRAMUSCULAR | Status: AC
  Administered 2020-04-08: 50 ug via INTRAVENOUS
  Filled 2020-04-08: qty 2

## 2020-04-08 MED ORDER — HEPARIN SODIUM (PORCINE) 1000 UNIT/ML IJ SOLN
INTRAMUSCULAR | Status: DC | PRN
  Administered 2020-04-08: 10000 [IU] via INTRAVENOUS

## 2020-04-08 MED ORDER — PHENYLEPHRINE HCL-NACL 10-0.9 MG/250ML-% IV SOLN: INTRAVENOUS | Status: DC | PRN

## 2020-04-08 MED ORDER — LIDOCAINE-EPINEPHRINE (PF) 1.5 %-1:200000 IJ SOLN
INTRAMUSCULAR | Status: DC | PRN
  Administered 2020-04-08: 30 mL via PERINEURAL

## 2020-04-08 MED ORDER — SODIUM CHLORIDE 0.9 % IV SOLN: INTRAVENOUS | Status: DC

## 2020-04-08 MED ORDER — ORAL CARE MOUTH RINSE: 15.0000 mL | Freq: Once | OROMUCOSAL | Status: DC

## 2020-04-08 MED ORDER — HEPARIN SODIUM (PORCINE) 1000 UNIT/ML IJ SOLN
INTRAMUSCULAR | Status: AC
  Administered 2020-04-08: 3800 [IU] via INTRAVENOUS
  Filled 2020-04-08: qty 4

## 2020-04-08 MED ORDER — MIDAZOLAM HCL 2 MG/2ML IJ SOLN
INTRAMUSCULAR | Status: AC
  Filled 2020-04-08: qty 2

## 2020-04-08 MED ORDER — MIDAZOLAM HCL 2 MG/2ML IJ SOLN
1.0000 mg | Freq: Once | INTRAMUSCULAR | Status: AC
  Filled 2020-04-08: qty 1

## 2020-04-08 MED ORDER — FENTANYL CITRATE (PF) 100 MCG/2ML IJ SOLN
50.0000 ug | Freq: Once | INTRAMUSCULAR | Status: AC
  Filled 2020-04-08: qty 1

## 2020-04-08 MED ORDER — LIDOCAINE-EPINEPHRINE (PF) 1 %-1:200000 IJ SOLN
INTRAMUSCULAR | Status: AC
  Filled 2020-04-08: qty 30

## 2020-04-08 MED ORDER — FENTANYL CITRATE (PF) 250 MCG/5ML IJ SOLN
INTRAMUSCULAR | Status: AC
  Filled 2020-04-08: qty 5

## 2020-04-08 MED ORDER — SODIUM CHLORIDE 0.9 % IV SOLN
INTRAVENOUS | Status: AC
  Filled 2020-04-08: qty 1.2

## 2020-04-08 MED ORDER — DEXTROSE 5 % IV SOLN
INTRAVENOUS | Status: DC | PRN
  Administered 2020-04-08: 3 g via INTRAVENOUS

## 2020-04-08 MED ORDER — CHLORHEXIDINE GLUCONATE 0.12 % MT SOLN
OROMUCOSAL | Status: AC
  Filled 2020-04-08: qty 15

## 2020-04-08 MED ORDER — PHENYLEPHRINE 40 MCG/ML (10ML) SYRINGE FOR IV PUSH (FOR BLOOD PRESSURE SUPPORT)
PREFILLED_SYRINGE | INTRAVENOUS | Status: DC | PRN
  Administered 2020-04-08 (×2): 120 ug via INTRAVENOUS
  Administered 2020-04-08: 80 ug via INTRAVENOUS

## 2020-04-08 MED ORDER — LIDOCAINE-EPINEPHRINE (PF) 1 %-1:200000 IJ SOLN
INTRAMUSCULAR | Status: DC | PRN
  Administered 2020-04-08: 10 mL

## 2020-04-08 SURGICAL SUPPLY — 27 items
ARMBAND PINK RESTRICT EXTREMIT (MISCELLANEOUS) ×3 IMPLANT
CANISTER SUCT 3000ML PPV (MISCELLANEOUS) ×2 IMPLANT
CANNULA VESSEL 3MM 2 BLNT TIP (CANNULA) ×2 IMPLANT
CLIP VESOCCLUDE MED 6/CT (CLIP) ×2 IMPLANT
CLIP VESOCCLUDE SM WIDE 6/CT (CLIP) ×2 IMPLANT
DERMABOND ADVANCED (GAUZE/BANDAGES/DRESSINGS) ×1
DERMABOND ADVANCED .7 DNX12 (GAUZE/BANDAGES/DRESSINGS) ×1 IMPLANT
ELECT REM PT RETURN 9FT ADLT (ELECTROSURGICAL) ×2
ELECTRODE REM PT RTRN 9FT ADLT (ELECTROSURGICAL) ×1 IMPLANT
GLOVE BIO SURGEON STRL SZ7.5 (GLOVE) ×2 IMPLANT
GLOVE SRG 8 PF TXTR STRL LF DI (GLOVE) ×1 IMPLANT
GLOVE SURG UNDER POLY LF SZ8 (GLOVE) ×1
GOWN STRL REUS W/ TWL LRG LVL3 (GOWN DISPOSABLE) ×3 IMPLANT
GOWN STRL REUS W/TWL LRG LVL3 (GOWN DISPOSABLE) ×3
KIT BASIN OR (CUSTOM PROCEDURE TRAY) ×2 IMPLANT
KIT TURNOVER KIT B (KITS) ×2 IMPLANT
NS IRRIG 1000ML POUR BTL (IV SOLUTION) ×2 IMPLANT
PACK CV ACCESS (CUSTOM PROCEDURE TRAY) ×2 IMPLANT
PAD ARMBOARD 7.5X6 YLW CONV (MISCELLANEOUS) ×4 IMPLANT
SPONGE SURGIFOAM ABS GEL 100 (HEMOSTASIS) IMPLANT
SUT PROLENE 6 0 BV (SUTURE) ×3 IMPLANT
SUT VIC AB 3-0 SH 27 (SUTURE) ×1
SUT VIC AB 3-0 SH 27X BRD (SUTURE) ×1 IMPLANT
SUT VICRYL 4-0 PS2 18IN ABS (SUTURE) ×2 IMPLANT
TOWEL GREEN STERILE (TOWEL DISPOSABLE) ×2 IMPLANT
UNDERPAD 30X36 HEAVY ABSORB (UNDERPADS AND DIAPERS) ×2 IMPLANT
WATER STERILE IRR 1000ML POUR (IV SOLUTION) ×2 IMPLANT

## 2020-04-08 NOTE — Anesthesia Procedure Notes (Signed)
Anesthesia Regional Block: Supraclavicular block   Pre-Anesthetic Checklist: ,, timeout performed, Correct Patient, Correct Site, Correct Laterality, Correct Procedure, Correct Position, site marked, Risks and benefits discussed,  Surgical consent,  Pre-op evaluation,  At surgeon's request and post-op pain management  Laterality: Left  Prep: chloraprep       Needles:  Injection technique: Single-shot  Needle Type: Echogenic Stimulator Needle     Needle Length: 9cm  Needle Gauge: 21     Additional Needles:   Procedures:,,,, ultrasound used (permanent image in chart),,,,  Narrative:  Start time: 04/08/2020 8:30 AM End time: 04/08/2020 8:35 AM Injection made incrementally with aspirations every 5 mL.  Performed by: Personally  Anesthesiologist: Effie Berkshire, MD  Additional Notes: Patient tolerated the procedure well. Local anesthetic introduced in an incremental fashion under minimal resistance after negative aspirations. No paresthesias were elicited. After completion of the procedure, no acute issues were identified and patient continued to be monitored by RN.

## 2020-04-08 NOTE — Progress Notes (Signed)
Occupational Therapy Note  Patient Details  Name: Max Pittman. MRN: [id number] Date of Birth: 10/29/1975  Today's Date: 04/08/2020 OT Missed Time: 75 Minutes Missed Time Reason: Unavailable (comment) (initially off unit for procedure, then drowsy from anesthesia upon return)  Pt initially off unit for procedure.  Pt returned ~1115.  Upon therapist return, pt reports still drowsy from anesthesia, numb LLE with inability to lift from pillow.  Pt reports drowsy and awaiting lunch prior to scheduled HD. Pt missed therapy due to initial off unit and then refusal due to fatigue.  Simonne Come 04/08/2020, 11:53 AM

## 2020-04-08 NOTE — Op Note (Signed)
    NAME: Max Pittman.    MRN: [id number] DOB: 1975-03-05    DATE OF OPERATION: 04/08/2020  PREOP DIAGNOSIS:    End-stage renal disease  POSTOP DIAGNOSIS:    Same  PROCEDURE:    Left first stage basilic vein transposition  SURGEON: Judeth Cornfield. Scot Dock, MD  ASSIST: Risa Grill, PA  ANESTHESIA: Axillary block  EBL: Minimal  INDICATIONS:    Max Pittman. is a 45 y.o. male who presents for new access.  He has a thrombosed left upper arm brachiocephalic fistula.  He had a reasonable basilic vein by duplex.  FINDINGS:   4 and half millimeter basilic vein excellent thrill to completion of the procedure with a palpable left radial pulse.  TECHNIQUE:   The patient was taken to the operating room and an axillary block had been placed by anesthesia.  The left arm was prepped and draped in usual sterile fashion.  An oblique incision was made just above the antecubital level and through this incision I was able to dissected out the basilic vein to where it branched multiple times.  I took the largest of the branches.  Branches were divided between clips and 3-0 silk ties.  Through the same incision I was able to dissected out the brachial artery beneath the fascia.  The patient was heparinized.  The brachial artery was clamped proximally distally and a longitudinal arteriotomy was made.  The vein was distended up with heparinized saline slightly spatulated and sewn end-to-side to the brachial artery using continuous 6-0 Prolene suture.  I left some slight redundancy in the fistula in anticipation of the second stage.  There was an excellent thrill in the fistula at the completion and a palpable radial pulse.  Hemostasis was obtained in the wound and the wound was closed with a deep layer of 3-0 Vicryl and the skin closed with 4-0 Vicryl.  Dermabond was applied.  The patient tolerated the procedure well was transferred to the recovery room in stable condition.  All needle and  sponge counts were correct.  Given the complexity of the case a first assistant was necessary in order to expedient the procedure and safely perform the technical aspects of the operation.  [name], MD, FACS [facility]  DATE OF DICTATION:   04/08/2020

## 2020-04-08 NOTE — Anesthesia Preprocedure Evaluation (Addendum)
Anesthesia Evaluation  Patient identified by MRN, date of birth, ID band Patient awake    Reviewed: Allergy & Precautions, NPO status , Patient's Chart, lab work & pertinent test results  Airway Mallampati: III  TM Distance: >3 FB Neck ROM: Full    Dental  (+) Teeth Intact, Dental Advisory Given   Pulmonary sleep apnea , former smoker,    breath sounds clear to auscultation       Cardiovascular hypertension, + Past MI   Rhythm:Regular Rate:Normal     Neuro/Psych Seizures -,   Neuromuscular disease CVA negative psych ROS   GI/Hepatic Neg liver ROS, GERD  Medicated,  Endo/Other  diabetes  Renal/GU Renal disease     Musculoskeletal  (+) Arthritis ,   Abdominal Normal abdominal exam  (+)   Peds  Hematology   Anesthesia Other Findings   Reproductive/Obstetrics                            Anesthesia Physical Anesthesia Plan  ASA: III  Anesthesia Plan: Regional   Post-op Pain Management:    Induction: Intravenous  PONV Risk Score and Plan: 3 and Ondansetron, Propofol infusion and Midazolam  Airway Management Planned: Simple Face Mask and Natural Airway  Additional Equipment: None  Intra-op Plan:   Post-operative Plan: Extubation in OR  Informed Consent: I have reviewed the patients History and Physical, chart, labs and discussed the procedure including the risks, benefits and alternatives for the proposed anesthesia with the patient or authorized representative who has indicated his/her understanding and acceptance.     Dental advisory given  Plan Discussed with: CRNA  Anesthesia Plan Comments:        Anesthesia Quick Evaluation

## 2020-04-08 NOTE — Progress Notes (Signed)
Occupational Therapy Note  Patient Details  Name: Max Pittman. MRN: [id number] Date of Birth: November 14, 1975  Today's Date: 04/08/2020 OT Missed Time: 14 Minutes Missed Time Reason: Unavailable (comment) (off unit for procedure)  Pt off unit for procedure. Pt missed 30 mins skilled OT services.   [name] [facility] 04/08/2020, 11:08 AM

## 2020-04-08 NOTE — Anesthesia Procedure Notes (Signed)
Procedure Name: MAC Date/Time: 04/08/2020 9:16 AM Performed by: Imagene Riches, CRNA Pre-anesthesia Checklist: Patient identified, Emergency Drugs available, Suction available, Patient being monitored and Timeout performed Patient Re-evaluated:Patient Re-evaluated prior to induction Oxygen Delivery Method: Simple face mask

## 2020-04-08 NOTE — Procedures (Signed)
Seen on Dialysis    Bp 110/70  Pulse 90  Afebrile  K 4.9   Carbon dioxide 28    Iron sats 9 %   Hb 9.9     Admitted with severe pulmonary edema and  Underwent aggressive volume removal

## 2020-04-08 NOTE — Progress Notes (Signed)
Physical Therapy Session Note  Patient Details  Name: Max Pittman. MRN: [id number] Date of Birth: December 17, 1975  Today's Date: 04/08/2020 PT Missed Time: 75 Minutes Missed Time Reason: Other (Comment) (off unit)  Skilled Therapeutic Interventions/Progress Updates:    Attempted to see pt for scheduled PT session however pt off unit for procedure. Will attempt to make up time as able. 75 minutes missed of skilled physical therapy.    [place], DPT   04/08/2020, 7:24 AM

## 2020-04-08 NOTE — Transfer of Care (Signed)
Immediate Anesthesia Transfer of Care Note  Patient: Max Pittman.  Procedure(s) Performed: ARTERIOVENOUS (AV) FISTULA CREATION (Left Arm Upper)  Patient Location: PACU  Anesthesia Type:General  Level of Consciousness: drowsy  Airway & Oxygen Therapy: Patient Spontanous Breathing and Patient connected to face mask oxygen  Post-op Assessment: Report given to RN and Post -op Vital signs reviewed and stable  Post vital signs: Reviewed and stable  Last Vitals:  Vitals Value Taken Time  BP 149/56 04/08/20 1021  Temp    Pulse 90 04/08/20 1021  Resp 14 04/08/20 1021  SpO2 92 % 04/08/20 1021  Vitals shown include unvalidated device data.  Last Pain:  Vitals:   04/08/20 0623  TempSrc:   PainSc: 6       Patients Stated Pain Goal: 3 ([id number])  Complications: No complications documented.

## 2020-04-08 NOTE — Interval H&P Note (Signed)
History and Physical Interval Note:  04/08/2020 8:18 AM  Max Pittman.  has presented today for surgery, with the diagnosis of x.  The various methods of treatment have been discussed with the patient and family. After consideration of risks, benefits and other options for treatment, the patient has consented to  Procedure(s): ARTERIOVENOUS (AV) FISTULA CREATION (Left) as a surgical intervention.  The patient's history has been reviewed, patient examined, no change in status, stable for surgery.  I have reviewed the patient's chart and labs.  Questions were answered to the patient's satisfaction.     [name]

## 2020-04-08 NOTE — Progress Notes (Signed)
PROGRESS NOTE   Subjective/Complaints: Has some pain at new access site.  Seen in HD. He has no complaints Agreeable to d/c 3/17  ROS: Denies CP, SOB, [name]  Objective:   VAS [place] UPPER EXT VEIN MAPPING (PRE-OP AVF)  Result Date: 04/07/2020 UPPER EXTREMITY VEIN MAPPING  Indications: Pre-access. History: ESRD, Thrombosed left AVF.  Comparison Study: No prior study Performing Technologist: Sharion Dove RVS  Examination Guidelines: A complete evaluation includes B-mode imaging, spectral Doppler, color Doppler, and power Doppler as needed of all accessible portions of each vessel. Bilateral testing is considered an integral part of a complete examination. Limited examinations for reoccurring indications may be performed as noted. +-----------------+-------------+----------+---------+ Right Cephalic   Diameter (cm)Depth (cm)Findings  +-----------------+-------------+----------+---------+ Prox upper arm       0.54        1.22   branching +-----------------+-------------+----------+---------+ Mid upper arm        0.55        1.16   branching +-----------------+-------------+----------+---------+ Dist upper arm       0.46        0.84   branching +-----------------+-------------+----------+---------+ Antecubital fossa    0.58        0.67   branching +-----------------+-------------+----------+---------+ Prox forearm         0.35        0.72             +-----------------+-------------+----------+---------+ Mid forearm          0.25        0.72             +-----------------+-------------+----------+---------+ Dist forearm         0.46        0.44   branching +-----------------+-------------+----------+---------+ Wrist                0.45        0.27             +-----------------+-------------+----------+---------+ +-----------------+-------------+----------+--------------+ Left Cephalic    Diameter  (cm)Depth (cm)   Findings    +-----------------+-------------+----------+--------------+ Antecubital fossa                       thrombosed AVF +-----------------+-------------+----------+--------------+ Prox forearm         0.22        0.21                  +-----------------+-------------+----------+--------------+ Mid forearm          0.18        0.64                  +-----------------+-------------+----------+--------------+ Dist forearm         0.22        0.20                  +-----------------+-------------+----------+--------------+ Wrist                0.45        0.27     branching    +-----------------+-------------+----------+--------------+ +-----------------+-------------+----------+---------+ Left Basilic     Diameter (cm)Depth (cm)Findings  +-----------------+-------------+----------+---------+ Mid upper arm  0.79        2.27    origin   +-----------------+-------------+----------+---------+ Dist upper arm       0.78        0.75   branching +-----------------+-------------+----------+---------+ Antecubital fossa    0.65        0.56             +-----------------+-------------+----------+---------+ Prox forearm         0.40        0.20   branching +-----------------+-------------+----------+---------+ Mid forearm          0.32        0.26   branching +-----------------+-------------+----------+---------+ Distal forearm       0.39        0.17   branching +-----------------+-------------+----------+---------+ Wrist                0.26        0.12   branching +-----------------+-------------+----------+---------+ *See table(s) above for measurements and observations.  Diagnosing physician: Harold Barban MD Electronically signed by Harold Barban MD on 04/07/2020 at 9:08:35 PM.    Final    Recent Labs    04/08/20 0825  HGB 10.2*  HCT 30.0*   Recent Labs    04/08/20 0825  NA 137  K 4.9  CL 100  GLUCOSE 93  BUN 56*   CREATININE 9.10*    Intake/Output Summary (Last 24 hours) at 04/08/2020 1831 Last data filed at 04/08/2020 1650 Gross per 24 hour  Intake 690 ml  Output 2425 ml  Net -1735 ml        Physical Exam: Vital Signs Blood pressure 118/68, pulse (!) 105, temperature 98.6 F (37 C), temperature source Oral, resp. rate 16, height '5\' 9"'$  (1.753 m), weight 118 kg, SpO2 96 %. Gen: no distress, normal appearing HEENT: oral mucosa pink and moist, NCAT Cardio: Tachycardic. Chest: normal effort, normal rate of breathing Abd: soft, non-distended Ext: no edema Psych: pleasant, normal affect Skin: some bleeding at new access site. Musc: Right lower extremity edema Bilateral lower extremity tenderness, mild Neuro: Alert Motor: Bilateral upper extremities: 4+/5 proximal distal RLE limited by pain 3/5 prox to 4/5 distally. LLE 3/5 prox to 4/5 distally.   Assessment/Plan: 1. Functional deficits which require 3+ hours per day of interdisciplinary therapy in a comprehensive inpatient rehab setting.  Physiatrist is providing close team supervision and 24 hour management of active medical problems listed below.  Physiatrist and rehab team continue to assess barriers to discharge/monitor patient progress toward functional and medical goals  Care Tool:  Bathing    Body parts bathed by patient: Right arm,Left arm,Chest,Abdomen,Front perineal area,Buttocks,Right upper leg,Left upper leg,Right lower leg,Left lower leg,Face         Bathing assist Assist Level: Contact Guard/Touching assist     Upper Body Dressing/Undressing Upper body dressing   What is the patient wearing?: Pull over shirt    Upper body assist Assist Level: Set up assist    Lower Body Dressing/Undressing Lower body dressing      What is the patient wearing?: Pants     Lower body assist Assist for lower body dressing: Contact Guard/Touching assist     Toileting Toileting    Toileting assist Assist for toileting:  Supervision/Verbal cueing Assistive Device Comment: drop arm BSC   Transfers Chair/bed transfer  Transfers assist     Chair/bed transfer assist level: Supervision/Verbal cueing (slide board)     Locomotion Ambulation   Ambulation assist      Assist  level: 2 helpers Assistive device: Walker-rolling Max distance: 50   Walk 10 feet activity   Assist  Walk 10 feet activity did not occur: Safety/medical concerns (bilateral knee buckling, weakness, fatigue, decreased balance)  Assist level: 2 helpers Assistive device: Walker-rolling   Walk 50 feet activity   Assist Walk 50 feet with 2 turns activity did not occur: Safety/medical concerns (bilateral knee buckling, weakness, fatigue, decreased balance)  Assist level: 2 helpers Assistive device: Walker-rolling    Walk 150 feet activity   Assist Walk 150 feet activity did not occur: Safety/medical concerns (bilateral knee buckling, weakness, fatigue, decreased balance)         Walk 10 feet on uneven surface  activity   Assist Walk 10 feet on uneven surfaces activity did not occur: Safety/medical concerns (bilateral knee buckling, weakness, fatigue, decreased balance)         Wheelchair     Assist Will patient use wheelchair at discharge?: Yes Type of Wheelchair: Manual    Wheelchair assist level: Supervision/Verbal cueing Max wheelchair distance: >168f    Wheelchair 50 feet with 2 turns activity    Assist        Assist Level: Supervision/Verbal cueing   Wheelchair 150 feet activity     Assist      Assist Level: Supervision/Verbal cueing   Blood pressure 118/68, pulse (!) 105, temperature 98.6 F (37 C), temperature source Oral, resp. rate 16, height '5\' 9"'$  (1.753 m), weight 118 kg, SpO2 96 %.  Medical Problem List and Plan: 1.Functional and mobility deficitssecondary to debility after cardiac arrest, volume overload multiple medical.  Continue CIR 2.  Antithrombotics: -DVT/anticoagulation:Mechanical:Sequential compression devices, below kneeBilateral lower extremities -antiplatelet therapy: Aspirin resumed March 7th, continue 3. Pain Management:  TENS unit. Encouraged heat for lower back  Continue amitriptyline '25mg'$    Controlled on 3/14, continue Percocet PRN. 4. Mood:LCSW to follow for evaluation and support. -antipsychotic agents: N/A 5. Neuropsych: This patientiscapable of making decisions on hisown behalf. 6. Skin/Wound Care:Routine pressure relief measures. 7. Fluids/Electrolytes/Nutrition:Strict I/Os. Monitor weights daily. Renal diet with 1200 cc/FR --consult dietician to educate patient on renal diet.  8.  ESRD: Has been transitioned to HD[facility]  --schedule HD at the end of the day to help with tolerance of therapy. New access obtained 3/14 9. H/o Seizure d/o: Continue Lamictal and Keppra--monitor for breakthrough seizures.   No breakthrough seizures since admission to rehab 10. T2DM: Hgb A1c-5.6 and diet controlled for the past 9 months.  Well controlled: may d/c checks 11. HTN: Continue Amlodipine, metoprolol and Imdur and titrate medications as indicated.   Controlled on 3/12  12. Spontaneous, large right iliopsoashematoma:  Leucocytosis resolved, continue to monitor with HD. Afebrile. Hgb stable at 8.5 -pain control as above 13. 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  -consult with ortho while here regarding considerations. Not a surgical candidate at this point, and he might never be unfortunately.  -pain control and activity modification.  14. OSA: Non-compliant with CPAP. 15. Acute on chronic anemia: Continue to  monitor H/H. ON aranesp weekly.   Hgb down to 8.7 on 3/11, continue to monitor 16.Neuropathy: acute on chronic now with worsening of symptoms RLE due to compression.  See #3 17. Dizziness: meclizine and vestibular eval ordered. Advised to ask for Meclizine PRN if needed.    Scopolamine patch d/ced since not beneficial.  Schedule Meclizine '25mg'$  TID- this has helped cut dizziness by half 18. Tachycardia  Relatively controlled on 3/12 19. Nausea: EKG to assess qTC.Increase Compazine to '10mg'$  q8H.  Improved 20. 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    LOS: 13 days A FACE TO FACE EVALUATION WAS PERFORMED  Izora Ribas 04/08/2020, 6:31 PM

## 2020-04-08 NOTE — Progress Notes (Signed)
Patient returned from OR at 1115 alert and able to verbalize needs and wants appropriately.  Left arm currently numb from nerve block, sling at bedside.  Will continue to monitor.

## 2020-04-08 NOTE — Progress Notes (Signed)
Patient accepted by [facility].   Max, Pittman

## 2020-04-08 NOTE — Progress Notes (Signed)
Renal Navigator appreciates CIR team re-evaluating discharge date to the day before an HD day. Team has agreed to discharge on 04/11/20, so that patient can be home for HD at outpatient on 04/12/20. Navigator has notified outpatient clinic/[name] and will fax discharge information on Thursday when available.  Navigator spoke with patient at HD bedside about plan. He is completely agreeable and gave permission to call significant other about plan. She did not answer, but Navigator left message explaining why we plan for discharge on a non-HD day and to call Navigator with questions about dialysis and CIR with questions about discharge.   Alphonzo Cruise, [place] Renal Navigator [phone]

## 2020-04-08 NOTE — Progress Notes (Signed)
Patient [facility] referral sent to [facility] for review.   [place], [place]

## 2020-04-09 ENCOUNTER — Ambulatory Visit: Payer: Medicare Other | Admitting: Adult Health

## 2020-04-09 ENCOUNTER — Encounter (HOSPITAL_COMMUNITY): Payer: Self-pay | Admitting: Vascular Surgery

## 2020-04-09 MED ORDER — SEVELAMER CARBONATE 800 MG PO TABS
1600.0000 mg | ORAL_TABLET | Freq: Three times a day (TID) | ORAL | Status: DC
  Administered 2020-04-09 – 2020-04-11 (×6): 1600 mg via ORAL
  Filled 2020-04-09 (×6): qty 2

## 2020-04-09 NOTE — Progress Notes (Signed)
Patient ID: Max Pittman., male   DOB: 1976-01-05, 45 y.o.   MRN: [id number] Team Conference Report to Patient/Family  Team Conference discussion was reviewed with the patient and caregiver, including goals, any changes in plan of care and target discharge date.  Patient and caregiver express understanding and are in agreement.  The patient has a target discharge date of 04/12/20.  Dyanne Iha 04/09/2020, 1:44 PM

## 2020-04-09 NOTE — Progress Notes (Signed)
Physical Therapy Discharge Summary  Patient Details  Name: Max Pittman. MRN: [id number] Date of Birth: [birth year]  Today's Date: 04/10/2020 PT Individual Time: 0900-0956 PT Individual Time Calculation (min): 56 min   Patient has met 5 of 5 long term goals due to improved activity tolerance, improved balance, improved postural control, increased strength, improved awareness and improved coordination. Patient to discharge at a wheelchair level Modified Independent/Supervision. Patient's care partner is independent to provide the necessary physical assistance at discharge. Pt's girlfriend, [name], attended family education training with OT on 3/12 and verbalized and demonstrated confidence with all transfers (including simulated car transfers) to ensure safe discharge home.   All goals met  Recommendation:  Patient will benefit from ongoing skilled PT services in home health setting to continue to advance safe functional mobility, address ongoing impairments in transfers, generalized strengthening, dynamic standing balance/coordination, gait training, endurance, and to minimize fall risk.  Equipment: slideboard  Reasons for discharge: treatment goals met  Patient/family agrees with progress made and goals achieved: Yes  Today's Interventions: Received pt sitting EOB finishing with OT, pt agreeable to therapy, and reported pain 8/10 in R hip radiating across low back. Pt reports not being due for more pain medication until 9:30am. Repositioning, rest brakes, and distraction done to reduce pain levels. Session with emphasis on functional mobility/transfers, generalized strengthening, dynamic standing balance/coordination, and improved activity tolerance. Pt performed 4 slideboard transfers throughout session with supervision while maintaining LLE NWB precautions with pt able to set up transfer mod I and manage board placement without assist. Pt transported down to 4W therapy gym in [facility] total  A for time management purposes. Pt transferred sit<>stand in // bars x 5 reps with CGA with therapist guarding R knee in case of buckling. Pt with 3 mild instances of buckling but able to self-correct with heavy reliance of UE support. Pt performed the following exercises sitting on mat with supervision and verbal cues for technique: -RUE hammer curls with 6lb dumbbell 2x10 -RUE bicep curls with 6lb dumbbell 2x10 -seated trunk rotation with 4.4lb medicine ball x10 bilaterally -horizontal chest press with 5lb dowel 2x10 -hamstring curls with blue TB 2x12 bilaterally (less tension on LLE due to weakness) -hip abduction with blue TB 2x10 -hip adduction ball squeezes 10x5 second isometric hold -tricep extensions on yoga blocks 2x10 Pt transported back to room in [facility] total A and requested to return to bed to rest. Therapist made up pt's bed and provided pt with fresh drink. Concluded session with pt supine in bed, needs within reach, and bed alarm on.   PT Discharge Precautions/Restrictions Precautions Precautions: Fall Precaution Comments: NWB LLE Restrictions Weight Bearing Restrictions: Yes LLE Weight Bearing: Non weight bearing Cognition Overall Cognitive Status: Within Functional Limits for tasks assessed Arousal/Alertness: Awake/alert Orientation Level: Oriented X4 Memory: Appears intact Awareness: Appears intact Problem Solving: Appears intact Safety/Judgment: Appears intact Sensation Sensation Light Touch: Impaired by gross assessment Peripheral sensation comments: decreased and altered sensation along RLE Proprioception: Appears Intact Coordination Gross Motor Movements are Fluid and Coordinated: No Fine Motor Movements are Fluid and Coordinated: Yes Coordination and Movement Description: grossly uncoordinated due to pain, generalized weakness, decreased balance/postural control, and L leg length discrepancy. Finger Nose Finger Test: WNL Heel Shin Test: WFL bilaterally but  slow Motor  Motor Motor: Abnormal postural alignment and control Motor - Skilled Clinical Observations: grossly uncoordinated due to pain, generalized weakness, decreased balance/postural control, and L leg length discrepancy.  Mobility Bed Mobility Bed Mobility: Rolling Right;Rolling Left;Sit  to Supine;Supine to Sit Rolling Right: Independent with assistive device Rolling Left: Independent with assistive device Supine to Sit: Independent with assistive device Sit to Supine: Independent with assistive device Transfers Transfers: Sit to Stand;Stand to Sit;Lateral/Scoot Transfers Sit to Stand: Contact Guard/Touching assist (// bars while maintaining LLE NWB precautions) Stand to Sit: Supervision/Verbal cueing (// bars while maintaining LLE NWB precautions) Stand Pivot Transfers: Other/comment (N/A due to LLE NWB precautions and tendency for RLE knee buckling) Stand Pivot Comment: N/A due to LLE NWB precautions and tendency for RLE knee buckling Lateral/Scoot Transfers: Supervision/Verbal cueing Transfer (Assistive device): Other (Comment) (slideboard) Locomotion  Gait Ambulation: No (N/A due to LLE NWB precautions and tendency for RLE knee buckling) Gait Gait: No (N/A due to LLE NWB precautions and tendency for RLE knee buckling) Stairs / Additional Locomotion Stairs: No (N/A due to LLE NWB precautions and tendency for RLE knee buckling) Wheelchair Mobility Wheelchair Mobility: Yes Wheelchair Assistance: Independent with Camera operator: Both upper extremities Wheelchair Parts Management: Independent Distance: >144f  Trunk/Postural Assessment  Cervical Assessment Cervical Assessment: Within Functional Limits Thoracic Assessment Thoracic Assessment: Exceptions to [facility](mild kyphosis) Lumbar Assessment Lumbar Assessment: Exceptions to [facility](posterior pelvic tilt in sitting) Postural Control Postural Control: Deficits on evaluation  Balance Balance Balance  Assessed: Yes Static Sitting Balance Static Sitting - Balance Support: Feet supported;No upper extremity supported Static Sitting - Level of Assistance: 7: Independent Dynamic Sitting Balance Dynamic Sitting - Balance Support: Feet supported;No upper extremity supported Dynamic Sitting - Level of Assistance: 6: Modified independent (Device/Increase time) Static Standing Balance Static Standing - Balance Support: Bilateral upper extremity supported (// bars) Static Standing - Level of Assistance: 5: Stand by assistance (close supervision) Extremity Assessment  RLE Assessment RLE Assessment: Exceptions to [facility]RLE Strength Right Hip Flexion: 4-/5 Right Hip ABduction: 4-/5 Right Hip ADduction: 3+/5 Right Knee Flexion: 4-/5 Right Knee Extension: 4-/5 Right Ankle Dorsiflexion: 4/5 Right Ankle Plantar Flexion: 4/5 LLE Assessment LLE Assessment: Exceptions to WL[facility]CLLE Strength Left Hip Flexion: 3-/5 Left Hip ABduction: 4-/5 Left Hip ADduction: 4-/5 Left Knee Flexion: 3+/5 Left Knee Extension: 4-/5 Left Ankle Dorsiflexion: 4-/5 Left Ankle Plantar Flexion: 4-/5  AAlfonse AlpersPT, DPT  04/09/2020, 7:42 AM

## 2020-04-09 NOTE — Progress Notes (Signed)
Conference time was at 0944.

## 2020-04-09 NOTE — Progress Notes (Signed)
Occupational Therapy Session Note  Patient Details  Name: Max Pittman. MRN: [id number] Date of Birth: [birth year]  Today's Date: 04/09/2020 OT Individual Time: 0732-0828 OT Individual Time Calculation (min): 56 min    Short Term Goals: Week 2:  OT Short Term Goal 1 (Week 2): STG = LTGs due to remaining LOS  Skilled Therapeutic Interventions/Progress Updates:    Treatment session with focus on self-care retraining, functional tranfers, and UE strengthening.  Pt received seated EOB finishing breakfast with RN present administering morning meds.  Pt declined bathing this session, but donned shirt after setup.  Pt transferred bed > w/c via slide board with supervision.  Pt positioned w/c at sink and completed oral care and washed face and hair after setup.  Discussed bathroom transfers as pt continues to report that he can ambulate "a couple steps" to toilet with RW.  Therapist discussed NWB through LLE and instability in RLE when in single leg stance with frequency of buckling, therefore recommending slide board or squat pivot transfers to drop arm BSC in bedroom. Pt reports understanding of recommendation. Pt completed squat pivot w/c > drop arm BSC > bed and back with supervision, approaching setup as pt able to complete without cues.  Pt propelled w/c 150' for BUE strengthening.  Pt reports some pain in LUE as fistula site, therefore therapist transported pt the rest of the way to therapy gym. Engaged in [place] with green (level 3) theraband in PNF diagonals and chest presses.  Discussed use of LUE as stabilizer until fistula matured and stable, then can progress to completing activity with BUE.  Pt returned to room and transferred squat pivot w/c > bed supervision.  Pt remained semi-reclined in bed with all needs in reach.  Therapy Documentation Precautions:  Precautions Precautions: Fall Precaution Comments: NWB LLE Restrictions Weight Bearing Restrictions: Yes LLE Weight  Bearing: Non weight bearing General:   Vital Signs: Therapy Vitals Temp: 98.6 F (37 C) Temp Source: Oral Pulse Rate: 92 Resp: 16 BP: 107/69 Patient Position (if appropriate): Lying Oxygen Therapy SpO2: 95 % O2 Device: Room Air Pain: Pain Assessment Pain Scale: 0-10 Pain Score: 8 RN provided pain meds at beginning of session   Therapy/Group: Individual Therapy  Simonne Come 04/09/2020, 8:28 AM

## 2020-04-09 NOTE — Progress Notes (Signed)
[facility] Progress Note   Subjective: Seen in room. New L 1st stage basilic vein transposition 03/14. Strong bruit throughout. No C/O. Discussed transition from PD back to HD. Emotional support to pt. Next HD tomorrow.  Objective Vitals:   04/08/20 1650 04/08/20 1700 04/08/20 1939 04/09/20 0443  BP: (!) 149/68 118/68 119/66 107/69  Pulse: (!) 101 (!) 105 95 92  Resp: '18 16 18 16  '$ Temp: 98.6 F (37 C) 98.6 F (37 C) 99.2 F (37.3 C) 98.6 F (37 C)  TempSrc: Oral Oral Oral Oral  SpO2: 99% 96% 99% 95%  Weight: 118 kg   118.4 kg  Height:       Physical Exam General: Pleasant, WN, WD male in NAD Heart: S1,S2, RRR No M/R/G No JVD Lungs: CTAB A/P Abdomen: Active BS Extremities: No LE edema Dialysis Access: New L AVF bruising along suture line strong bruit throughout. RIJ TDC drsg CDI.    Additional Objective Labs: Basic Metabolic Panel: Recent Labs  Lab 04/03/20 1134 04/05/20 1311 04/08/20 0825  NA 135 137 137  K 4.2 4.4 4.9  CL 98 100 100  CO2 25 28  --   GLUCOSE 100* 133* 93  BUN 40* 39* 56*  CREATININE 8.20* 8.34* 9.10*  CALCIUM 9.8 9.5  --   PHOS 2.6 2.1*  --    Liver Function Tests: Recent Labs  Lab 04/03/20 1134 04/05/20 1311  ALBUMIN 2.3* 2.3*   No results for input(s): LIPASE, AMYLASE in the last 168 hours. CBC: Recent Labs  Lab 04/03/20 1134 04/05/20 1311 04/08/20 0825  WBC 8.6 9.4  --   HGB 9.3* 8.7* 10.2*  HCT 30.5* 28.2* 30.0*  MCV 91.9 91.9  --   PLT 326 278  --    Blood Culture    Component Value Date/Time   SDES PERITONEAL DIALYSATE 03/20/2020 0812   SPECREQUEST Immunocompromised 03/20/2020 0812   CULT  03/20/2020 0812    NO GROWTH 3 DAYS Performed at [facility], [place] [address], [place], [place] [address]    REPTSTATUS 03/23/2020 FINAL 03/20/2020 [id number]    Cardiac Enzymes: No results for input(s): CKTOTAL, CKMB, CKMBINDEX, TROPONINI in the last 168 hours. CBG: Recent Labs  Lab 04/08/20 1019  GLUCAP  107*   Iron Studies: No results for input(s): IRON, TIBC, TRANSFERRIN, FERRITIN in the last 72 hours. '@lablastinr3'$ @ Studies/Results: VAS [place] UPPER EXT VEIN MAPPING (PRE-OP AVF)  Result Date: 04/07/2020 UPPER EXTREMITY VEIN MAPPING  Indications: Pre-access. History: ESRD, Thrombosed left AVF.  Comparison Study: No prior study Performing Technologist: Sharion Dove RVS  Examination Guidelines: A complete evaluation includes B-mode imaging, spectral Doppler, color Doppler, and power Doppler as needed of all accessible portions of each vessel. Bilateral testing is considered an integral part of a complete examination. Limited examinations for reoccurring indications may be performed as noted. +-----------------+-------------+----------+---------+ Right Cephalic   Diameter (cm)Depth (cm)Findings  +-----------------+-------------+----------+---------+ Prox upper arm       0.54        1.22   branching +-----------------+-------------+----------+---------+ Mid upper arm        0.55        1.16   branching +-----------------+-------------+----------+---------+ Dist upper arm       0.46        0.84   branching +-----------------+-------------+----------+---------+ Antecubital fossa    0.58        0.67   branching +-----------------+-------------+----------+---------+ Prox forearm         0.35  0.72             +-----------------+-------------+----------+---------+ Mid forearm          0.25        0.72             +-----------------+-------------+----------+---------+ Dist forearm         0.46        0.44   branching +-----------------+-------------+----------+---------+ Wrist                0.45        0.27             +-----------------+-------------+----------+---------+ +-----------------+-------------+----------+--------------+ Left Cephalic    Diameter (cm)Depth (cm)   Findings    +-----------------+-------------+----------+--------------+ Antecubital fossa                        thrombosed AVF +-----------------+-------------+----------+--------------+ Prox forearm         0.22        0.21                  +-----------------+-------------+----------+--------------+ Mid forearm          0.18        0.64                  +-----------------+-------------+----------+--------------+ Dist forearm         0.22        0.20                  +-----------------+-------------+----------+--------------+ Wrist                0.45        0.27     branching    +-----------------+-------------+----------+--------------+ +-----------------+-------------+----------+---------+ Left Basilic     Diameter (cm)Depth (cm)Findings  +-----------------+-------------+----------+---------+ Mid upper arm        0.79        2.27    origin   +-----------------+-------------+----------+---------+ Dist upper arm       0.78        0.75   branching +-----------------+-------------+----------+---------+ Antecubital fossa    0.65        0.56             +-----------------+-------------+----------+---------+ Prox forearm         0.40        0.20   branching +-----------------+-------------+----------+---------+ Mid forearm          0.32        0.26   branching +-----------------+-------------+----------+---------+ Distal forearm       0.39        0.17   branching +-----------------+-------------+----------+---------+ Wrist                0.26        0.12   branching +-----------------+-------------+----------+---------+ *See table(s) above for measurements and observations.  Diagnosing physician: Harold Barban MD Electronically signed by Harold Barban MD on 04/07/2020 at 9:08:35 PM.    Final    Medications: . ferric gluconate (FERRLECIT/NULECIT) IV 125 mg (04/08/20 1559)   . (feeding supplement) PROSource Plus  30 mL Oral BID BM  . amitriptyline  50 mg Oral QHS  . aspirin EC  81 mg Oral Daily  . atorvastatin  40 mg Oral q1800  . buPROPion   150 mg Oral BID WC  . cephALEXin  250 mg Oral Q12H  . Chlorhexidine Gluconate Cloth  6 each Topical Q0600  . darbepoetin (ARANESP) injection - DIALYSIS  100 mcg Intravenous  Q Fri-HD  . docusate sodium  100 mg Oral BID  . feeding supplement (NEPRO CARB STEADY)  237 mL Oral TID BM  . FLUoxetine  10 mg Oral Daily  . isosorbide mononitrate  15 mg Oral Daily  . lamoTRIgine  100 mg Oral BID  . levETIRAcetam  500 mg Oral Daily  . loratadine  10 mg Oral Daily  . meclizine  25 mg Oral TID  . mouth rinse  15 mL Mouth Rinse BID  . melatonin  5 mg Oral QHS  . metoprolol tartrate  12.5 mg Oral BID  . multivitamin  1 tablet Oral QHS  . Muscle Rub   Topical TID WC & HS  . pantoprazole  40 mg Oral Daily  . polyethylene glycol  17 g Oral Daily  . pregabalin  75 mg Oral QHS  . prochlorperazine  10 mg Oral Q8H   Or  . prochlorperazine  5-10 mg Intramuscular Q8H   Or  . prochlorperazine  12.5 mg Rectal Q8H  . sevelamer carbonate  2,400 mg Oral TID WC     Assessment/Plan: 1. Acute hypoxic respiratory failure/ cardiac arrest: due to severe volume overload/pulmonary edema causing asystolic cardiac arrest. Resolved. Now in rehab due to deconditioning 2. ESRD: severe vol overload on admission. Had CRRT w/ 31 L removed. 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 Transitioned to HD. IR placed TDC on 2/23 and PD cath removed 2/25 per gen surgery.Tentatively has OP spot at [facility] on MWF early AM. Next HD 3/16  3. HD access: New basilic vein transposition per Dr. Scot Dock 04/08/20. + Bruit.   4. Retroperitoneal hematoma: seen by CT 2/24 large R iliopsoas hematoma 10 x 7 x 20 cm. Anticoagulation on hold.  5. Anemia ckd: Hb 10.2 03/14 (H & H),  getting darbe 100 ug q week. Last 3/11. Tsat on 3/5 was 9% >ordered IV Fe bolus per pharm.  6. CKD-MBD: Corr Ca elevated. Use 2 Ca bath on HD. Phos too low, decrease binder dose.  on Renvela.  PTH 51 7. Nutrition: On renal diet. Add prot supp   8. Hypertension: BP controlled. Euvolemic on exam, no edema 9. 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 H/o CVA/ hx seizures - taking lamictal and keppra 11. Dispo - on inpatient rehab. Plans to DC home Thursday.   Shandora Koogler H. Aashna Matson NP-C 04/09/2020, 8:47 AM  [employer] [phone]

## 2020-04-09 NOTE — Progress Notes (Addendum)
  Progress Note    04/09/2020 7:24 AM 1 Day Post-Op  Subjective:  No major complaints. Reports some bleeding from incision last night. Dry gauze applied. No further bleeding this morning. Says arm and left hand feel good. Denies any pain, numbness, coldness, or weakness   Vitals:   04/08/20 1939 04/09/20 0443  BP: 119/66 107/69  Pulse: 95 92  Resp: 18 16  Temp: 99.2 F (37.3 C) 98.6 F (37 C)  SpO2: 99% 95%   Physical Exam: Cardiac:  regular Lungs: non labored Incisions:  Left AC incision is clean, dry and intact. There is some ecchymosis present around incision. No hematoma or swelling Extremities:  2+ radial pulse, left upper extremity well perfused and warm. 5/5 grip strenth Neurologic: alert and oriented  CBC    Component Value Date/Time   WBC 9.4 04/05/2020 1311   RBC 3.07 (L) 04/05/2020 1311   HGB 10.2 (L) 04/08/2020 0825   HCT 30.0 (L) 04/08/2020 0825   PLT 278 04/05/2020 1311   MCV 91.9 04/05/2020 1311   MCH 28.3 04/05/2020 1311   MCHC 30.9 04/05/2020 1311   RDW 15.2 04/05/2020 1311   LYMPHSABS 2.0 03/21/2020 0108   MONOABS 1.2 (H) 03/21/2020 0108   EOSABS 0.7 (H) 03/21/2020 0108   BASOSABS 0.1 03/21/2020 0108    BMET    Component Value Date/Time   NA 137 04/08/2020 0825   K 4.9 04/08/2020 0825   CL 100 04/08/2020 0825   CO2 28 04/05/2020 1311   GLUCOSE 93 04/08/2020 0825   BUN 56 (H) 04/08/2020 0825   CREATININE 9.10 (H) 04/08/2020 0825   CREATININE 7.58 (H) 12/18/2019 1106   CALCIUM 9.5 04/05/2020 1311   GFRNONAA 8 (L) 04/05/2020 1311   GFRNONAA 8 (L) 12/18/2019 1106   GFRAA 9 (L) 12/18/2019 1106    INR    Component Value Date/Time   INR 1.3 (H) 03/26/2020 0139     Intake/Output Summary (Last 24 hours) at 04/09/2020 0724 Last data filed at 04/09/2020 0120 Gross per 24 hour  Intake 930 ml  Output 2025 ml  Net -1095 ml     Assessment/Plan:  45 y.o. male is s/p  Left 1st stage basilic vein transposition 1 Day Post-Op. Doing well post  op. No signs or symptoms of steal syndrome. Had some bleeding from incision last night. Dry and intact this morning. No hematoma. He will follow up in our office in 4-6 weeks with a fistula duplex   Karoline Caldwell, PA-C [facility] [phone] 04/09/2020 7:24 AM   I have interviewed the patient and examined the patient. I agree with the findings by the PA.  Vascular surgery will be available as needed.  Gae Gallop, MD [phone]

## 2020-04-09 NOTE — Progress Notes (Signed)
PROGRESS NOTE   Subjective/Complaints: Some pain at new access site. Appreciate vascular eval this morning- healing well. Discussed follow-up with Clinic at [facility] with patient and Reesa Chew- this will be scheduled by ortho.   ROS: Denies CP, SOB, [name], dizziness is better controlled.   Objective:   VAS [place] UPPER EXT VEIN MAPPING (PRE-OP AVF)  Result Date: 04/07/2020 UPPER EXTREMITY VEIN MAPPING  Indications: Pre-access. History: ESRD, Thrombosed left AVF.  Comparison Study: No prior study Performing Technologist: Sharion Dove RVS  Examination Guidelines: A complete evaluation includes B-mode imaging, spectral Doppler, color Doppler, and power Doppler as needed of all accessible portions of each vessel. Bilateral testing is considered an integral part of a complete examination. Limited examinations for reoccurring indications may be performed as noted. +-----------------+-------------+----------+---------+ Right Cephalic   Diameter (cm)Depth (cm)Findings  +-----------------+-------------+----------+---------+ Prox upper arm       0.54        1.22   branching +-----------------+-------------+----------+---------+ Mid upper arm        0.55        1.16   branching +-----------------+-------------+----------+---------+ Dist upper arm       0.46        0.84   branching +-----------------+-------------+----------+---------+ Antecubital fossa    0.58        0.67   branching +-----------------+-------------+----------+---------+ Prox forearm         0.35        0.72             +-----------------+-------------+----------+---------+ Mid forearm          0.25        0.72             +-----------------+-------------+----------+---------+ Dist forearm         0.46        0.44   branching +-----------------+-------------+----------+---------+ Wrist                0.45        0.27              +-----------------+-------------+----------+---------+ +-----------------+-------------+----------+--------------+ Left Cephalic    Diameter (cm)Depth (cm)   Findings    +-----------------+-------------+----------+--------------+ Antecubital fossa                       thrombosed AVF +-----------------+-------------+----------+--------------+ Prox forearm         0.22        0.21                  +-----------------+-------------+----------+--------------+ Mid forearm          0.18        0.64                  +-----------------+-------------+----------+--------------+ Dist forearm         0.22        0.20                  +-----------------+-------------+----------+--------------+ Wrist                0.45        0.27     branching    +-----------------+-------------+----------+--------------+ +-----------------+-------------+----------+---------+ Left  Basilic     Diameter (cm)Depth (cm)Findings  +-----------------+-------------+----------+---------+ Mid upper arm        0.79        2.27    origin   +-----------------+-------------+----------+---------+ Dist upper arm       0.78        0.75   branching +-----------------+-------------+----------+---------+ Antecubital fossa    0.65        0.56             +-----------------+-------------+----------+---------+ Prox forearm         0.40        0.20   branching +-----------------+-------------+----------+---------+ Mid forearm          0.32        0.26   branching +-----------------+-------------+----------+---------+ Distal forearm       0.39        0.17   branching +-----------------+-------------+----------+---------+ Wrist                0.26        0.12   branching +-----------------+-------------+----------+---------+ *See table(s) above for measurements and observations.  Diagnosing physician: Harold Barban MD Electronically signed by Harold Barban MD on 04/07/2020 at 9:08:35 PM.    Final     Recent Labs    04/08/20 0825  HGB 10.2*  HCT 30.0*   Recent Labs    04/08/20 0825  NA 137  K 4.9  CL 100  GLUCOSE 93  BUN 56*  CREATININE 9.10*    Intake/Output Summary (Last 24 hours) at 04/09/2020 0923 Last data filed at 04/09/2020 0740 Gross per 24 hour  Intake 1120 ml  Output 2025 ml  Net -905 ml        Physical Exam: Vital Signs Blood pressure 107/69, pulse 92, temperature 98.6 F (37 C), temperature source Oral, resp. rate 16, height '5\' 9"'$  (1.753 m), weight 118.4 kg, SpO2 95 %. Gen: no distress, normal appearing HEENT: oral mucosa pink and moist, NCAT Cardio: Reg rate Chest: normal effort, normal rate of breathing Abd: soft, non-distended Ext: no edema Psych: pleasant, normal affect Skin: some bleeding at new access site, bandage in place.  Musc: Right lower extremity edema Bilateral lower extremity tenderness, mild Neuro: Alert Motor: Bilateral upper extremities: 4+/5 proximal distal RLE limited by pain 3/5 prox to 4/5 distally. LLE 3/5 prox to 4/5 distally.   Assessment/Plan: 1. Functional deficits which require 3+ hours per day of interdisciplinary therapy in a comprehensive inpatient rehab setting.  Physiatrist is providing close team supervision and 24 hour management of active medical problems listed below.  Physiatrist and rehab team continue to assess barriers to discharge/monitor patient progress toward functional and medical goals  Care Tool:  Bathing    Body parts bathed by patient: Right arm,Left arm,Chest,Abdomen,Front perineal area,Buttocks,Right upper leg,Left upper leg,Right lower leg,Left lower leg,Face         Bathing assist Assist Level: Contact Guard/Touching assist     Upper Body Dressing/Undressing Upper body dressing   What is the patient wearing?: Pull over shirt    Upper body assist Assist Level: Set up assist    Lower Body Dressing/Undressing Lower body dressing      What is the patient wearing?: Pants      Lower body assist Assist for lower body dressing: Contact Guard/Touching assist     Toileting Toileting    Toileting assist Assist for toileting: Supervision/Verbal cueing Assistive Device Comment: drop arm BSC   Transfers Chair/bed transfer  Transfers assist  Chair/bed transfer assist level: Supervision/Verbal cueing     Locomotion Ambulation   Ambulation assist      Assist level: 2 helpers Assistive device: Walker-rolling Max distance: 50   Walk 10 feet activity   Assist  Walk 10 feet activity did not occur: Safety/medical concerns (bilateral knee buckling, weakness, fatigue, decreased balance)  Assist level: 2 helpers Assistive device: Walker-rolling   Walk 50 feet activity   Assist Walk 50 feet with 2 turns activity did not occur: Safety/medical concerns (bilateral knee buckling, weakness, fatigue, decreased balance)  Assist level: 2 helpers Assistive device: Walker-rolling    Walk 150 feet activity   Assist Walk 150 feet activity did not occur: Safety/medical concerns (bilateral knee buckling, weakness, fatigue, decreased balance)         Walk 10 feet on uneven surface  activity   Assist Walk 10 feet on uneven surfaces activity did not occur: Safety/medical concerns (bilateral knee buckling, weakness, fatigue, decreased balance)         Wheelchair     Assist Will patient use wheelchair at discharge?: Yes Type of Wheelchair: Manual    Wheelchair assist level: Supervision/Verbal cueing Max wheelchair distance: >133f    Wheelchair 50 feet with 2 turns activity    Assist        Assist Level: Supervision/Verbal cueing   Wheelchair 150 feet activity     Assist      Assist Level: Supervision/Verbal cueing   Blood pressure 107/69, pulse 92, temperature 98.6 F (37 C), temperature source Oral, resp. rate 16, height '5\' 9"'$  (1.753 m), weight 118.4 kg, SpO2 95 %.  Medical Problem List and Plan: 1.Functional and  mobility deficitssecondary to debility after cardiac arrest, volume overload multiple medical.  Continue CIR  -Interdisciplinary Team Conference today  2. Antithrombotics: -DVT/anticoagulation:Mechanical:Sequential compression devices, below kneeBilateral lower extremities -antiplatelet therapy: Aspirin resumed March 7th, continue 3. Pain Management:  TENS unit. Encouraged heat for lower back  Continue amitriptyline '25mg'$    Controlled on 3/15, continue Percocet PRN. 4. Mood:LCSW to follow for evaluation and support. -antipsychotic agents: N/A 5. Neuropsych: This patientiscapable of making decisions on hisown behalf. 6. Skin/Wound Care:Routine pressure relief measures. 7. Fluids/Electrolytes/Nutrition:Strict I/Os. Monitor weights daily. Renal diet with 1200 cc/FR --consult dietician to educate patient on renal diet.  8.  ESRD: [facility]  [facility]schedule HD at the end of the day to help with tolerance of therapy. New access obtained 3/14 9. H/o Seizure d/o: Continue Lamictal and Keppra--monitor for breakthrough seizures.   No breakthrough seizures since admission to rehab 10. T2DM: Hgb A1c-5.6 and diet controlled for the past 9 months.  Well controlled: may d/c checks 11. HTN: Continue Amlodipine, metoprolol and Imdur and titrate medications as indicated.   Controlled on 3/14- continue current regimen.   12. Spontaneous, large right iliopsoashematoma:  Leucocytosis resolved, continue to monitor with HD. Afebrile. Hgb stable at 8.5 -pain control as above 13. 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  -consult with ortho while here regarding considerations. Not a surgical candidate at  this point, and he might never be unfortunately.  -pain control and activity modification.  14. OSA: Non-compliant with CPAP. 15. Acute on chronic anemia: Continue to monitor H/H. ON aranesp weekly.   Hgb down to 8.7 on 3/11, continue to monitor 16.Neuropathy: acute on chronic now with worsening of symptoms RLE due to  compression.  See #3 17. Dizziness: meclizine and vestibular eval ordered. Advised to ask for Meclizine PRN if needed.    Scopolamine patch d/ced since not beneficial.    Schedule Meclizine '25mg'$  TID- this has helped cut dizziness by half 18. Tachycardia  Relatively controlled on 3/12 19. Nausea: EKG to assess qTC.Increase Compazine to '10mg'$  q8H.  Improved 20. 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   21. Plan for d/c on 3/17- patient agreeable.   LOS: 14 days A FACE TO FACE EVALUATION WAS PERFORMED  Clide Deutscher Cartha Rotert 04/09/2020, 9:23 AM

## 2020-04-09 NOTE — Anesthesia Postprocedure Evaluation (Signed)
Anesthesia Post Note  Patient: Max Pittman.  Procedure(s) Performed: ARTERIOVENOUS (AV) FISTULA CREATION (Left Arm Upper)     Patient location during evaluation: PACU Anesthesia Type: Regional Level of consciousness: awake and alert Pain management: pain level controlled Vital Signs Assessment: post-procedure vital signs reviewed and stable Respiratory status: spontaneous breathing, nonlabored ventilation, respiratory function stable and patient connected to nasal cannula oxygen Cardiovascular status: stable and blood pressure returned to baseline Postop Assessment: no apparent nausea or vomiting Anesthetic complications: no   No complications documented.      Effie Berkshire

## 2020-04-09 NOTE — Progress Notes (Signed)
Physical Therapy Session Note  Patient Details  Name: Max Pittman. MRN: [id number] Date of Birth: 09-17-1975  Today's Date: 04/09/2020 PT Individual Time: 9068-9340 PT Individual Time Calculation (min): 56 min   Short Term Goals: Week 1:  PT Short Term Goal 1 (Week 1): pt will transfer sit<>stand with LRAD and CGA PT Short Term Goal 1 - Progress (Week 1): Met PT Short Term Goal 2 (Week 1): pt will transfer bed<>chair with LRAD and CGA PT Short Term Goal 2 - Progress (Week 1): Progressing toward goal PT Short Term Goal 3 (Week 1): pt will ambulate 78f with LRAD and min A of 1 PT Short Term Goal 3 - Progress (Week 1): Met Week 2:  PT Short Term Goal 1 (Week 2): STG=LTG due to LOS  Skilled Therapeutic Interventions/Progress Updates:   Received pt sitting in WC, pt agreeable to therapy, and reported pain 7/10 in R hip and across low back. RN present to administer medications and repositioning, rest breaks, and distraction done to reduce pain levels. Session with emphasis on functional mobility/transfers, generalized strengthening, dynamic sitting balance/coordination, and improved activity tolerance. Pt [facility] purposes and transferred WC<>Nustep via squat<>pivot x 2 trials with supervision and performed BUE (fading to RUE only due to discomfort in LUE) and RLE strengthening on Nustep at workload 5 for 6 minutes and 40 seconds for a total of 251 steps for improved cardiovascular endurance with 2 rest breaks due to UE fatigue. Pt with questions regarding using crutches and a knee scooter at discharge. Educated pt that due to safety concerns of R knee buckling it is not recommended that pt use either type of DME and further emphasized avoiding standing without skilled assistance due to fall risk. [facility] the [facility] for technique: -bicep curls on RUE 1x15 and 1x20  with 4lb dumbbell  -hip flexion 2x12 bilaterally with 2lb ankle weight on RLE -LAQ 2x12 bilaterally with 2lb ankle weight  [facility] and transferred WC<>bed squat<>pivot with close supervision and sit<>supine Mod I using bedrails. Concluded session with pt supine in bed, needs within reach, and bed alarm on.   Therapy Documentation Precautions:  Precautions Precautions: Fall Precaution Comments: had fall 2/22 Restrictions Weight Bearing Restrictions: Yes LLE Weight Bearing: Non weight bearing  Therapy/Group: Individual Therapy AAlfonse AlpersPT, DPT   04/09/2020, 7:32 AM

## 2020-04-09 NOTE — Patient Care Conference (Signed)
Inpatient RehabilitationTeam Conference and Plan of Care Update Date: 04/09/2020   Time: 9:44 AM    Patient Name: Max Pittman.      Medical Record Number: [id number]  Date of Birth: Sep 30, 1975 Sex: Male         Room/Bed: 5C06C/5C06C-01 Payor Info: Payor: MEDICARE / Plan: MEDICARE PART A AND B / Product Type: *No Product type* /    Admit Date/Time:  03/26/2020  3:23 PM  Primary Diagnosis:  [facility] Problems: Principal Problem:   Debility Active Problems:   Sinus tachycardia   Acute on chronic anemia   ESRD on dialysis [facility])   Seizures [facility])    Expected Discharge Date: Expected Discharge Date: 04/12/20  Team Members Present: Physician leading conference: Dr. Leeroy Cha Care Coodinator Present: Erlene Quan, BSW;Stacey Creig Hines, RN, BSN, [place] Nurse Present: Mohammed Kindle, RN PT Present: Becky Sax, PT OT Present: Simonne Come, OT PPS Coordinator present : Gunnar Fusi, SLP     Current Status/Progress Goal Weekly Team Focus  Bowel/Bladder   Pt continent of B/B. Last BM 3/15  Remain continent of B/B  toliet pt q2h and PRN   Swallow/Nutrition/ Hydration             ADL's   CGA sit > stand, CGA to Supervision slide board transfers, CGA LB bathing and dressing  Supervision overall  ADL retraining, dynamic standing balance, slide board transfers as pt now NWB through LLE, activity tolerance   Mobility   bed mobility supervision, slideboard transfers supervision, WC mobility >171f supervision  supervision transfers, Mod I WC mobility  functional mobility/transfers, generalized strengthening, dynamic standing balance/coordination, adherance to WC precautions   Communication             Safety/Cognition/ Behavioral Observations            Pain   Pt has pain on LUE post 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 Pain is currently being managed with percocet.  Pain is manged at a comfortable level for patient or < 3 out of 10  Assess pain q4H and PRN    Skin   Wound on abd and back of head are OTA and healing with no S/S of infection  Wounfs continue to heal with no S/S of infection or pain. No new skin injuries or breakdown  Assess skin Qshift and skin care regiment followed     Discharge Planning:  Discharging home with girlfriend   Team Discussion: Pain is better controlled, nausea is better controlled, dizziness is better controlled. New HD site created yesterday, continued wound care for abdomin and back of head. Continent B/B, pain managed with Percocet, wounds OTA. Discharging home with girlfriend. Patient on target to meet rehab goals: Supervision for bed mobility, supervision for transfers, mod I for WC mobility, will need a slideboard. Lateral scoot transfers with and without slideboard.   *See Care Plan and progress notes for long and short-term goals.   Revisions to Treatment Plan:  Monitor new access site for bleeding.  Teaching Needs: Family education, medication management, pain management, skin/wound care, transfer training, gait training, WC management, endurance training, balance training.  Current Barriers to Discharge: Inaccessible home environment, Decreased caregiver support, Medical stability, Home enviroment access/layout, Wound care, Lack of/limited family support, Weight, Hemodialysis, Weight bearing restrictions, Medication compliance and Behavior  Possible Resolutions to Barriers: Continue current medications, provide emotional support.     Medical Summary Current Status: Pain is better controlled, nausea is better controlled, dizziness is better controlled  Barriers  to Discharge: Medical stability;Wound care  Barriers to Discharge Comments: continues to have some pain, nausea, and dizziness though much better controlled, new HD access site created yesterday, labile BP Possible Resolutions to [employer]: continue monitoring new access site for bleeding, wean medications for nausea/dizziness/pain  as tolerated this week, continue BP medications and monitoring TID   Continued Need for Acute Rehabilitation Level of Care: The patient requires daily medical management by a physician with specialized training in physical medicine and rehabilitation for the following reasons: Direction of a multidisciplinary physical rehabilitation program to maximize functional independence : Yes Medical management of patient stability for increased activity during participation in an intensive rehabilitation regime.: Yes Analysis of laboratory values and/or radiology reports with any subsequent need for medication adjustment and/or medical intervention. : Yes   I attest that I was present, lead the team conference, and concur with the assessment and plan of the team.   Cristi Loron 04/09/2020, 1:55 PM

## 2020-04-09 NOTE — Progress Notes (Signed)
Physical Therapy Session Note  Patient Details  Name: Max Pittman. MRN: [id number] Date of Birth: 1975-12-05  Today's Date: 04/09/2020 PT Individual Time: 1011-1104 PT Individual Time Calculation (min): 53 min   Short Term Goals: Week 2:  PT Short Term Goal 1 (Week 2): STG=LTG due to LOS  Skilled Therapeutic Interventions/Progress Updates:    Pt received asleep, supine in bed and upon awakening agreeable to therapy session. Supine>sitting R EOB, HOB partially elevated and using bedrail, with supervision. Sitting EOB donned socks without assist. L lateral scoot transfer EOB>w/c using transfer board with close supervision for safety - cuing for proper positioning of board and L LE NWBing during transfer. B UE w/c propulsion ~123f with 1x seated rest break and supervision - performed L LE long arc quads x15 reps and seated leg extended ankle pumps x15reps during therapeutic rest break. Pt demonstrates significant L hip muscle weakness requiring compensation of using hands to place L foot on/off leg w/c leg rest. R lateral scoot transfer w/c>EOM using transfer board with close supervision and cuing for proper w/c set-up.   Discussed D/C plan and home set-up with pt confirming ramped entrance but then states that his wheelchair does not fit into his bedroom. Pt with poor awareness of deficits and current functional mobility level reporting he plans to use RW to ambulate in/out of his bedroom - therapist reoriented pt to new L LE NWBing restrictions and inability to stand at this time due to R LE weakness. Upon further discussion pt reports he can remove rims on w/c wheels and that will allow sufficient space to navigate wheelchair in/out of bedroom door. Confirmed that his S.O. KSantiago Gladwill provide 24hr support at home. Discussed option of performing real-life car transfer during afternoon PT session today with pt planning to discuss this with KSantiago Glad his S.O.  Sit<>supine on mat with supervision  for safety and pt requiring use of UEs to assist with L LE management on/off bed due to hip muscle weakness.  Performed the following L LE exercises:  - supine active assisted L LE heel slides with pt unable to maintain hip alignment with LE falling into external rotation x8 reps - supine L LE straight leg internal rotation AROM x10 reps - R sidelying, L LE clamshell x10 reps through limited ROM Provided cuing and education throughout for proper form/technique. Provided pt with printed HEP.  Pam, PA in/out to discuss pt's medical follow-up after D/C.  L lateral scoot EOM>w/c using transfer board with supervision and cuing for proper board placement. Transported back to room. R lateral scoot w/c>EOB as above. Pt left supine in bed with needs in reach, bed alarm on, and RN present for medication administration.  Therapy Documentation Precautions:  Precautions Precautions: Fall Precaution Comments: NWB LLE Restrictions Weight Bearing Restrictions: Yes LLE Weight Bearing: Non weight bearing  Pain: Reports "ache" in L hip throughout session - RN notified and present for medication administration at end of session - provided rest breaks and repositioning throughout session for pain management.  Therapy/Group: Individual Therapy  CTawana Scale, PT, DPT, CSRS  04/09/2020, 7:58 AM

## 2020-04-09 NOTE — Progress Notes (Signed)
Occupational Therapy Session Note  Patient Details  Name: Max Pittman. MRN: [id number] Date of Birth: 1975-05-08  Today's Date: 04/09/2020 OT Individual Time: 7616-0737 OT Individual Time Calculation (min): 42 min    Short Term Goals: Week 1:  OT Short Term Goal 1 (Week 1): Pt will complete toilet transfers stand pivot with min assist OT Short Term Goal 1 - Progress (Week 1): Met OT Short Term Goal 2 (Week 1): Pt will complete toileting with min assist at sit > stand level OT Short Term Goal 2 - Progress (Week 1): Met OT Short Term Goal 3 (Week 1): Pt will complete 2 grooming tasks in standing for increased standing tolerance OT Short Term Goal 3 - Progress (Week 1): Progressing toward goal OT Short Term Goal 4 (Week 1): Pt will complete LB dressing at sit > stand level with supervision OT Short Term Goal 4 - Progress (Week 1): Met Week 2:  OT Short Term Goal 1 (Week 2): STG = LTGs due to remaining LOS   Skilled Therapeutic Interventions/Progress Updates:    Pt greeted at time of session semi reclined in bed resting with SO present, agreeable to OT session. Did not need to do ADL or toilet. Slide board bed > wheelchair set up/supervision including pt able to do board placement to adhere to NWB LLE. Transported to gym for time, slide board > mat same manner as above, performed 2 rounds of zip ball unsupported sitting first trial no weight and second with 2# wrist weights for 1 minute per round. Dynamic core strengthening and UB strengthenign with 4# dowel for ball hits 2x20. Also discussed throughout session home set up as he may not be able to fit in bathroom via wheelchair and will need to use his drop arm BSC at home. Transported back to room and set up with alarm on call bell in reach.   Therapy Documentation Precautions:  Precautions Precautions: Fall Precaution Comments: had fall 2/22 Restrictions Weight Bearing Restrictions: Yes LLE Weight Bearing: Non weight  bearing    Therapy/Group: Individual Therapy  Viona Gilmore 04/09/2020, 7:26 AM

## 2020-04-09 NOTE — Progress Notes (Signed)
**  Late entry**    04/03/20 0830  What Happened  Was fall witnessed? Yes  Who witnessed fall? Rocco Serene, OT  Patients activity before fall during therapy  Point of contact other (comment) (knees)  Was patient injured? No  Patient found other (Comment) (controlled descent to floor)  Found by Staff-comment  Stated prior activity other (comment) (during therapy session)  Follow Up  MD notified Raulkar  Time MD notified 0900  Family notified No - patient refusal  Additional tests No  Progress note created (see row info) Yes  Adult Fall Risk Assessment  Risk Factor Category (scoring not indicated) Fall has occurred during this admission (document High fall risk)  Age 45  Fall History: Fall within 6 months prior to admission 5  Elimination; Bowel and/or Urine Incontinence 0  Elimination; Bowel and/or Urine Urgency/Frequency 0  Medications: includes PCA/Opiates, Anti-convulsants, Anti-hypertensives, Diuretics, Hypnotics, Laxatives, Sedatives, and Psychotropics 5  Patient Care Equipment 0  Mobility-Assistance 2  Mobility-Gait 2  Mobility-Sensory Deficit 0  Altered awareness of immediate physical environment 0  Impulsiveness 0  Lack of understanding of one's physical/cognitive limitations 4  Total Score 18  Patient Fall Risk Level High fall risk  Adult Fall Risk Interventions  Required Bundle Interventions *See Row Information* High fall risk - low, moderate, and high requirements implemented  Additional Interventions Use of appropriate toileting equipment (bedpan, BSC, etc.);Lap belt while in chair/wheelchair (Rehab only);Family Supervision  Screening for Fall Injury Risk (To be completed on HIGH fall risk patients) - Assessing Need for Floor Mats  Risk For Fall Injury- Criteria for Floor Mats Previous fall this admission  Will Implement Floor Mats Yes  Pain Assessment  Pain Scale 0-10  Pain Score 0  Neurological  Neuro (WDL) WDL  R Hand Grip Strong  L Hand Grip Strong  R  Foot Dorsiflexion Moderate  L Foot Dorsiflexion Weak  RUE Motor Response Purposeful movement  RUE Sensation Full sensation  RUE Motor Strength 5  LUE Motor Response Purposeful movement  LUE Sensation Full sensation  LUE Motor Strength 4  RLE Motor Response Purposeful movement  RLE Sensation Full sensation  RLE Motor Strength 4  LLE Motor Response Purposeful movement  LLE Sensation Full sensation  LLE Motor Strength 4  Neuro Symptoms None  Neuro symptoms relieved by Rest  Musculoskeletal  Musculoskeletal (WDL) X  Assistive Device Wheelchair;Stedy  Generalized Weakness Yes  Weight Bearing Restrictions Yes  LLE Weight Bearing NWB  Integumentary  Integumentary (WDL) X  Skin Color Appropriate for ethnicity  Skin Condition Dry  Ecchymosis Location Arm  Ecchymosis Location Orientation Right;Left  Ecchymosis Intervention Other (Comment) (assessed)  Skin Turgor Non-tenting

## 2020-04-09 NOTE — Plan of Care (Signed)
  Problem: Consults Goal: RH GENERAL PATIENT EDUCATION Description: See Patient Education module for education specifics. Outcome: Progressing   Problem: RH SKIN INTEGRITY Goal: RH STG ABLE TO PERFORM INCISION/WOUND CARE W/ASSISTANCE Description: STG Able To Perform Incision/Wound Care With mod I Assistance. Outcome: Progressing   Problem: RH SAFETY Goal: RH STG ADHERE TO SAFETY PRECAUTIONS W/ASSISTANCE/DEVICE Description: STG Adhere to Safety Precautions With mod I Assistance/Device. Outcome: Progressing   Problem: RH PAIN MANAGEMENT Goal: RH STG PAIN MANAGED AT OR BELOW PT'S PAIN GOAL Description: Less than 5 out of 10 Outcome: Progressing   Problem: RH KNOWLEDGE DEFICIT GENERAL Goal: RH STG INCREASE KNOWLEDGE OF SELF CARE AFTER HOSPITALIZATION Description: Patient will be able to demonstrate knowledge of medication management with educational materials and handouts provided by staff.  Outcome: Progressing   Problem: Consults Goal: RH GENERAL PATIENT EDUCATION Description: See Patient Education module for education specifics. Outcome: Progressing   Problem: RH SKIN INTEGRITY Goal: RH STG ABLE TO PERFORM INCISION/WOUND CARE W/ASSISTANCE Description: STG Able To Perform Incision/Wound Care With mod I Assistance. Outcome: Progressing   Problem: RH SAFETY Goal: RH STG ADHERE TO SAFETY PRECAUTIONS W/ASSISTANCE/DEVICE Description: STG Adhere to Safety Precautions With mod I Assistance/Device. Outcome: Progressing   Problem: RH PAIN MANAGEMENT Goal: RH STG PAIN MANAGED AT OR BELOW PT'S PAIN GOAL Description: Less than 5 out of 10 Outcome: Progressing   Problem: RH KNOWLEDGE DEFICIT GENERAL Goal: RH STG INCREASE KNOWLEDGE OF SELF CARE AFTER HOSPITALIZATION Description: Patient will be able to demonstrate knowledge of medication management with educational materials and handouts provided by staff.  Outcome: Progressing

## 2020-04-10 MED ORDER — ALTEPLASE 2 MG IJ SOLR: 2.0000 mg | Freq: Once | INTRAMUSCULAR | Status: DC | PRN

## 2020-04-10 MED ORDER — HEPARIN SODIUM (PORCINE) 1000 UNIT/ML DIALYSIS: 20.0000 [IU]/kg | INTRAMUSCULAR | Status: DC | PRN

## 2020-04-10 MED ORDER — PENTAFLUOROPROP-TETRAFLUOROETH EX AERO: 1.0000 "application " | INHALATION_SPRAY | CUTANEOUS | Status: DC | PRN

## 2020-04-10 MED ORDER — SODIUM CHLORIDE 0.9 % IV SOLN: 100.0000 mL | INTRAVENOUS | Status: DC | PRN

## 2020-04-10 MED ORDER — LIDOCAINE HCL (PF) 1 % IJ SOLN: 5.0000 mL | INTRAMUSCULAR | Status: DC | PRN

## 2020-04-10 MED ORDER — HEPARIN SODIUM (PORCINE) 1000 UNIT/ML IJ SOLN
INTRAMUSCULAR | Status: AC
  Administered 2020-04-10: 3800 [IU] via INTRAVENOUS
  Filled 2020-04-10: qty 4

## 2020-04-10 MED ORDER — HEPARIN SODIUM (PORCINE) 1000 UNIT/ML DIALYSIS: 1000.0000 [IU] | INTRAMUSCULAR | Status: DC | PRN

## 2020-04-10 NOTE — Progress Notes (Signed)
Physical Therapy Session Note  Patient Details  Name: Max Pittman. MRN: [id number] Date of Birth: Nov 23, 1975  Today's Date: 04/10/2020 PT Individual Time: 1030-1125 PT Individual Time Calculation (min): 55 min   Short Term Goals: Week 2:  PT Short Term Goal 1 (Week 2): STG=LTG due to LOS  Skilled Therapeutic Interventions/Progress Updates:    Patient received supine in bed, agreeable to PT. He reports 10/10 pain in B proximal LE and low back. RN alerted and provided patient with pain rx. Discussed non-pharmacological pain interventions with patient. He was able to propel himself ~50f with B UE before fatiguing. PT propelling patient remainder of the way to 4th floor therapy gym. UE ergometer completed 2 mins fwd, 2 mins backward x4 with rest breaks to improve UE endurance and strength. Patient completing 3x10 ball toss + shoulder press with 1kg ball. He required extended rest breaks between sets due to fatigue. He denied pain with activity, but reported feeling his "muscles working." Patient able to propel himself ~784fwith wc back toward his room with PT propelling him remainder of the way. He remained up in chair at end of session with chair alarm on, call light within reach.   Therapy Documentation Precautions:  Precautions Precautions: Fall Precaution Comments: NWB LLE Restrictions Weight Bearing Restrictions: No LLE Weight Bearing: Non weight bearing    Therapy/Group: Individual Therapy  JeKaroline CaldwellPT, DPT, CBIS  04/10/2020, 10:53 AM

## 2020-04-10 NOTE — Progress Notes (Signed)
PROGRESS NOTE   [name], SOB, [name], dizziness and nausea are better controlled.   Objective:   No results found. Recent Labs    04/08/20 0825  HGB 10.2*  HCT 30.0*   Recent Labs    04/08/20 0825  NA 137  K 4.9  CL 100  GLUCOSE 93  BUN 56*  CREATININE 9.10*    Intake/Output Summary (Last 24 hours) at 04/10/2020 0906 Last data filed at 04/10/2020 0735 Gross per 24 hour  Intake 1060 ml  Output --  Net 1060 ml        Physical Exam: Vital Signs Blood pressure 101/66, pulse 64, temperature 97.9 F (36.6 C), temperature source Oral, resp. rate 18, height '5\' 9"'$  (1.753 m), weight 118 kg, SpO2 93 %. Gen: no distress, normal appearing HEENT: oral mucosa pink and moist, NCAT Cardio: Reg rate Chest: normal effort, normal rate of breathing Abd: soft, non-distended Ext: no edema Psych: pleasant, normal affect Skin: some bleeding at new access site, bandage in place.  Musc: Right lower extremity edema Bilateral lower extremity tenderness, mild Neuro: Alert Motor: Bilateral upper extremities: 4+/5 proximal distal RLE limited by pain 3/5 prox to 4/5 distally. LLE 3/5 prox to 4/5 distally.   Assessment/Plan: 1. Functional deficits which require 3+ hours per day of interdisciplinary therapy in a comprehensive inpatient rehab setting.  Physiatrist is providing close team supervision and 24 hour management of active medical problems listed below.  Physiatrist and rehab team continue to assess barriers to discharge/monitor patient progress toward functional and medical goals  Care Tool:  Bathing    Body parts bathed by patient: Right arm,Left arm,Chest,Abdomen,Front perineal area,Buttocks,Right upper  leg,Left upper leg,Right lower leg,Left lower leg,Face         Bathing assist Assist Level: Contact Guard/Touching assist     Upper Body Dressing/Undressing Upper body dressing   What is the patient wearing?: Pull over shirt    Upper body assist Assist Level: Set up assist    Lower Body Dressing/Undressing Lower body dressing      What is the patient wearing?: Pants     Lower body assist Assist for lower body dressing: Contact Guard/Touching assist     Toileting Toileting    Toileting assist Assist for toileting: Supervision/Verbal cueing Assistive Device Comment: drop arm BSC   Transfers Chair/bed transfer  Transfers assist     Chair/bed transfer assist level: Supervision/Verbal cueing (slide board)     Locomotion Ambulation   Ambulation assist      Assist level: 2 helpers Assistive device: Walker-rolling Max distance: 50   Walk 10 feet activity   Assist  Walk 10 feet activity did not occur: Safety/medical concerns (bilateral knee buckling, weakness, fatigue, decreased balance)  Assist level: 2 helpers Assistive device: Walker-rolling   Walk 50 feet activity   Assist Walk 50 feet with 2 turns activity did not occur: Safety/medical concerns (bilateral knee buckling, weakness, fatigue, decreased balance)  Assist level: 2  helpers Assistive device: Walker-rolling    Walk 150 feet activity   Assist Walk 150 feet activity did not occur: Safety/medical concerns (bilateral knee buckling, weakness, fatigue, decreased balance)         Walk 10 feet on uneven surface  activity   Assist Walk 10 feet on uneven surfaces activity did not occur: Safety/medical concerns (bilateral knee buckling, weakness, fatigue, decreased balance)         Wheelchair     Assist Will patient use wheelchair at discharge?: Yes Type of Wheelchair: Manual    Wheelchair assist level: Supervision/Verbal cueing,Set up assist Max wheelchair distance: 167f     Wheelchair 50 feet with 2 turns activity    Assist        Assist Level: Supervision/Verbal cueing   Wheelchair 150 feet activity     Assist      Assist Level: Supervision/Verbal cueing   Blood pressure 101/66, pulse 64, temperature 97.9 F (36.6 C), temperature source Oral, resp. rate 18, height '5\' 9"'$  (1.753 m), weight 118 kg, SpO2 93 %.  Medical Problem List and Plan: 1.Functional and mobility deficitssecondary to debility after cardiac arrest, volume overload multiple medical.  Continue CIR 2. Antithrombotics: -DVT/anticoagulation:Mechanical:Sequential compression devices, below kneeBilateral lower extremities -antiplatelet therapy: Aspirin resumed March 7th, continue 3. Pain Management:  TENS unit. Encouraged heat for lower back  Continue amitriptyline '25mg'$    Controlled on 3/16, continue Percocet PRN. 4. Mood:LCSW to follow for evaluation and support. -antipsychotic agents: N/A 5. Neuropsych: This patientiscapable of making decisions on hisown behalf. 6. Skin/Wound Care:Routine pressure relief measures. 7. Fluids/Electrolytes/Nutrition:Strict I/Os. Monitor weights daily. Renal diet with 1200 cc/FR --consult dietician to educate patient on renal diet.  8.  ESRD: [facility] HD[facility]  --schedule HD at the end of the day to help with tolerance of therapy. New access obtained 3/14 9. H/o Seizure d/o: Continue Lamictal and Keppra--monitor for breakthrough seizures.   No breakthrough seizures since admission to rehab 10. T2DM: Hgb A1c-5.6 and diet controlled for the past 9 months.  Well controlled: may d/c checks 11. HTN: Continue Amlodipine, metoprolol and Imdur and titrate medications as indicated.   Controlled on 3/16- continue current regimen.   12. Spontaneous, large right iliopsoashematoma:  Leucocytosis resolved,  continue to monitor with HD. Afebrile. Hgb stable at 8.5 -pain control as above 13. 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  -consult with ortho while here regarding considerations. Not a surgical candidate at this point, and he might never be unfortunately.  -pain control and activity modification.  14. OSA: Non-compliant with CPAP. 15. Acute on chronic anemia: Continue to monitor H/H. ON aranesp weekly.   Hgb down to 8.7 on 3/11, continue to monitor 16.Neuropathy: acute on chronic now with worsening of symptoms RLE due to compression.  See #3 17. Dizziness: meclizine and vestibular eval ordered. Advised to ask for Meclizine PRN if needed.    Scopolamine patch d/ced since not beneficial.    Schedule Meclizine '25mg'$  TID- this has helped cut dizziness by half 18. Tachycardia  Relatively controlled on 3/12 19. Nausea: EKG to assess qTC.Increase Compazine to '10mg'$  q8H.  Improved 20. 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   21. Plan for d/c on 3/17- patient agreeable. Discussed outpatient follow-up with ortho (patient to schedule) and me (scheduled)  LOS: 15 days A FACE  TO FACE EVALUATION WAS PERFORMED  Clide Deutscher Infantof Villagomez 04/10/2020, 9:06 AM

## 2020-04-10 NOTE — Progress Notes (Signed)
Occupational Therapy Session Note  Patient Details  Name: Max Pittman. MRN: [id number] Date of Birth: 18-May-1975  Today's Date: 04/10/2020 OT Individual Time: 0818-0900 OT Individual Time Calculation (min): 42 min    Short Term Goals: Week 2:  OT Short Term Goal 1 (Week 2): STG = LTGs due to remaining LOS  Skilled Therapeutic Interventions/Progress Updates:    Completed ADL retraining seated at EOB with focus on lateral leans for LB bathing and dressing.  Pt received seated semi-reclined in bed with RN administering meds.  Pt agreeable to bathing and dressing this session.  Pt completed bed mobility Mod I to come to sitting EOB for bathing.  Therapist set up pt with items, pt then able to complete UB bathing and dressing without assistance.  Pt is able to complete LB bathing and dressing with lateral leans at supervision level.  Therapist continues to recommend pt completing tasks with lateral leans to decrease burden of care and increase safety.  Pt expressed desire to attempt in standing.  Pt will require min assist if completing clothing management at sit > stand level due to NWB through LLE and instability in RLE.  Pt returned to sitting and completed it with lateral leans supervision.  PT arriving and pt passed off to PT for next therapy session.  Therapy Documentation Precautions:  Precautions Precautions: Fall Precaution Comments: NWB LLE Restrictions Weight Bearing Restrictions: No LLE Weight Bearing: Non weight bearing General:   Vital Signs:   Pain: Pain Assessment Pain Scale: 0-10 Pain Score: 4  Pain Location: Back ADL: ADL Eating: Independent Where Assessed-Eating: Edge of bed Grooming: Modified independent Where Assessed-Grooming: Sitting at sink Upper Body Bathing: Setup Where Assessed-Upper Body Bathing: Edge of bed Lower Body Bathing: Supervision/safety Where Assessed-Lower Body Bathing: Edge of bed Upper Body Dressing: Setup Where Assessed-Upper  Body Dressing: Edge of bed Lower Body Dressing: Supervision/safety Where Assessed-Lower Body Dressing: Edge of bed Toileting: Supervision/safety Where Assessed-Toileting: Bedside Commode Toilet Transfer: Close supervision Toilet Transfer Method: Sit pivot Toilet Transfer Equipment: Drop arm bedside commode Vision Baseline Vision/History: No visual deficits Patient Visual Report: No change from baseline Vision Assessment?: No apparent visual deficits Perception  Perception: Within Functional Limits Praxis Praxis: Intact Exercises:   Other Treatments:     Therapy/Group: Individual Therapy  Simonne Come 04/10/2020, 8:56 AM

## 2020-04-10 NOTE — Progress Notes (Addendum)
Occupational Therapy Session Note  Patient Details  Name: Max Pittman. MRN: [id number] Date of Birth: 1975/10/05  Today's Date: 04/10/2020 OT Individual Time: 1131-1200 OT Individual Time Calculation (min): 29 min    Short Term Goals: Week 2:  OT Short Term Goal 1 (Week 2): STG = LTGs due to remaining LOS  Skilled Therapeutic Interventions/Progress Updates:    Pt in wheelchair to start session.  He was able to complete squat pivot transfer over to the regular toilet with supervision.  He was able to manage clothing prior to toileting with supervision and lateral leans.  He did complete sit to stand without an assistive device for pulling pants up and turning back to the wheelchair.  He did not maintain NWBIng through the LLE and it was noted that the right knee buckled. Cued pt that he was not supposed to put weight on the LLE, which he also stated he was aware of.   Finished session with pt transferring to the bed with supervision using the sliding board for transfer.  Call button and phone in reach with bed alarm in place.    Therapy Documentation Precautions:  Precautions Precautions: Fall Precaution Comments: NWB LLE Restrictions Weight Bearing Restrictions: No LLE Weight Bearing: Non weight bearing   Pain: Pain Assessment Pain Scale: Faces Pain Score: 5  Pain Type: Chronic pain Pain Location: Back Pain Orientation: Lower;Mid Pain Radiating Towards: Legs Pain Descriptors / Indicators: Aching;Radiating Pain Frequency: Constant Pain Onset: On-going Patients Stated Pain Goal: 3 Pain Intervention(s): Medication (See eMAR);Repositioned ADL: See Care Tool Section for some details of mobility and selfcare  Therapy/Group: Individual Therapy  Oneda Duffett OTR/L 04/10/2020, 12:16 PM

## 2020-04-10 NOTE — Progress Notes (Signed)
Max Pittman Progress Note   Subjective: Seen in room working with PT. No C/Os. HD today. Discharging home tomorrow.   Objective Vitals:   04/09/20 0443 04/09/20 1256 04/09/20 1931 04/10/20 0443  BP: 107/69 100/67 (!) 102/58 101/66  Pulse: 92 95 80 64  Resp: '16 15 20 18  '$ Temp: 98.6 F (37 C) 98.6 F (37 C) 98 F (36.7 C) 97.9 F (36.6 C)  TempSrc: Oral Oral Oral Oral  SpO2: 95% 94% 100% 93%  Weight: 118.4 kg   118 kg  Height:       Physical Exam General: Pleasant, WN, WD male in NAD Heart: S1,S2, RRR No M/R/G No JVD Lungs: CTAB A/P Abdomen: Active BS Extremities: No LE edema Dialysis Access: New L AVF bruising along suture line strong bruit throughout. RIJ TDC drsg CDI.    Additional Objective Labs: Basic Metabolic Panel: Recent Labs  Lab 04/03/20 1134 04/05/20 1311 04/08/20 0825  NA 135 137 137  K 4.2 4.4 4.9  CL 98 100 100  CO2 25 28  --   GLUCOSE 100* 133* 93  BUN 40* 39* 56*  CREATININE 8.20* 8.34* 9.10*  CALCIUM 9.8 9.5  --   PHOS 2.6 2.1*  --    Liver Function Tests: Recent Labs  Lab 04/03/20 1134 04/05/20 1311  ALBUMIN 2.3* 2.3*   No results for input(s): LIPASE, AMYLASE in the last 168 hours. CBC: Recent Labs  Lab 04/03/20 1134 04/05/20 1311 04/08/20 0825  WBC 8.6 9.4  --   HGB 9.3* 8.7* 10.2*  HCT 30.5* 28.2* 30.0*  MCV 91.9 91.9  --   PLT 326 278  --    Blood Culture    Component Value Date/Time   SDES PERITONEAL DIALYSATE 03/20/2020 0812   SPECREQUEST Immunocompromised 03/20/2020 0812   CULT  03/20/2020 0812    NO GROWTH 3 DAYS Performed at [facility], [place] [address] [place], [place] [address]    REPTSTATUS 03/23/2020 FINAL 03/20/2020 [id number]    Cardiac Enzymes: No results for input(s): CKTOTAL, CKMB, CKMBINDEX, TROPONINI in the last 168 hours. CBG: Recent Labs  Lab 04/08/20 1019  GLUCAP 107*   Iron Studies: No results for input(s): IRON, TIBC, TRANSFERRIN, FERRITIN in the last 72  hours. '@lablastinr3'$ @ Studies/Results: No results found. Medications: . ferric gluconate (FERRLECIT/NULECIT) IV 125 mg (04/08/20 1559)   . (feeding supplement) PROSource Plus  30 mL Oral BID BM  . amitriptyline  50 mg Oral QHS  . aspirin EC  81 mg Oral Daily  . atorvastatin  40 mg Oral q1800  . buPROPion  150 mg Oral BID WC  . cephALEXin  250 mg Oral Q12H  . Chlorhexidine Gluconate Cloth  6 each Topical Q0600  . darbepoetin (ARANESP) injection - DIALYSIS  100 mcg Intravenous Q Fri-HD  . docusate sodium  100 mg Oral BID  . feeding supplement (NEPRO CARB STEADY)  237 mL Oral TID BM  . FLUoxetine  10 mg Oral Daily  . isosorbide mononitrate  15 mg Oral Daily  . lamoTRIgine  100 mg Oral BID  . levETIRAcetam  500 mg Oral Daily  . loratadine  10 mg Oral Daily  . meclizine  25 mg Oral TID  . mouth rinse  15 mL Mouth Rinse BID  . melatonin  5 mg Oral QHS  . metoprolol tartrate  12.5 mg Oral BID  . multivitamin  1 tablet Oral QHS  . Muscle Rub   Topical TID WC & HS  . pantoprazole  40 mg Oral Daily  . polyethylene glycol  17 g Oral Daily  . pregabalin  75 mg Oral QHS  . prochlorperazine  10 mg Oral Q8H   Or  . prochlorperazine  5-10 mg Intramuscular Q8H   Or  . prochlorperazine  12.5 mg Rectal Q8H  . sevelamer carbonate  1,600 mg Oral TID WC     Assessment/Plan: 1. Acute hypoxic respiratory failure/ cardiac arrest: due to severe volume overload/pulmonary edema causing asystolic cardiac arrest. Resolved. Now in rehab due to deconditioning 2. ESRD: severe vol overload on admission. Had CRRT w/ 31 L removed. 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 Transitioned to HD. IR placed TDC on 2/23 and PD cath removed 2/25 per gen surgery.Tentatively has OP spot at [facility] on MWF early AM. Next HD 3/16  3. HD access: New basilic vein transposition per Dr. Scot Dock 04/08/20. + Bruit.  4. Retroperitoneal hematoma: seen by CT 2/24 large R iliopsoas hematoma 10 x 7 x 20 cm.  Anticoagulation on hold.  5. Anemia ckd: Hb 10.2 03/14 (H & H), getting darbe 100 ug q week. Last 3/11. Tsat on 3/5 was 9% >ordered IV Fe bolus per pharm.  6. CKD-MBD: Corr Ca elevated. Use 2 Ca bath on HD. Phos too low, decrease binder dose.  on Renvela. PTH 51 7. Nutrition: On renal diet. Add prot supp 8. Hypertension: BP controlled. Euvolemic on exam, no edema. UF as tolerated.  9. 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 H/o CVA/ hx seizures - taking lamictal and keppra 11. Dispo - on inpatient rehab. Plans to DC home Thursday.   Max Levingston H. Naija Troost NP-C 04/10/2020, 11:50 AM  [employer] [phone]

## 2020-04-10 NOTE — Progress Notes (Signed)
Occupational Therapy Discharge Summary  Patient Details  Name: Max Pittman. MRN: [id number] Date of Birth: 16-Oct-1975  Today's Date: 04/10/2020   Patient has met 6 of 6 long term goals due to improved activity tolerance, postural control, ability to compensate for deficits and improved awareness.  Patient to discharge at overall Supervision level.  Patient's care partner is independent to provide the necessary physical assistance at discharge.  Patient's SO, Max Pittman, present for family education and was able to verbalize and demonstrate understanding of recommendations for self-care and transfers at current level due to NWB through LLE and instability of RLE in single leg stance.  Reasons goals not met: N/A  Recommendation:  Patient will benefit from ongoing skilled OT services in home health setting to continue to advance functional skills in the area of BADL and Reduce care partner burden.  Equipment: No equipment provided  Reasons for discharge: treatment goals met and discharge from hospital  Patient/family agrees with progress made and goals achieved: Yes  OT Discharge Precautions/Restrictions  Precautions Precautions: Fall Precaution Comments: NWB LLE Pain Pain Assessment Pain Scale: 0-10 Pain Score: 4  Pain Location: Back ADL ADL Eating: Independent Where Assessed-Eating: Edge of bed Grooming: Modified independent Where Assessed-Grooming: Sitting at sink Upper Body Bathing: Setup Where Assessed-Upper Body Bathing: Edge of bed Lower Body Bathing: Supervision/safety Where Assessed-Lower Body Bathing: Edge of bed Upper Body Dressing: Setup Where Assessed-Upper Body Dressing: Edge of bed Lower Body Dressing: Supervision/safety Where Assessed-Lower Body Dressing: Edge of bed Toileting: Supervision/safety Where Assessed-Toileting: Bedside Commode Toilet Transfer: Close supervision Toilet Transfer Method: Sit pivot Science writer: Drop arm bedside  commode Vision Baseline Vision/History: No visual deficits Patient Visual Report: No change from baseline Vision Assessment?: No apparent visual deficits Perception  Perception: Within Functional Limits Praxis Praxis: Intact Cognition Overall Cognitive Status: Within Functional Limits for tasks assessed Arousal/Alertness: Awake/alert Orientation Level: Oriented X4 Attention: Selective Selective Attention: Appears intact Memory: Appears intact Awareness: Appears intact Problem Solving: Appears intact Safety/Judgment: Appears intact Sensation Sensation Light Touch: Impaired by gross assessment Peripheral sensation comments: decreased and altered sensation along RLE Proprioception: Appears Intact Coordination Gross Motor Movements are Fluid and Coordinated: No Fine Motor Movements are Fluid and Coordinated: Yes Coordination and Movement Description: grossly uncoordinated due to pain, generalized weakness, decreased balance/postural control, and L leg length discrepancy. Finger Nose Finger Test: WNL Heel Shin Test: WFL bilaterally but slow Motor  Motor Motor: Abnormal postural alignment and control Motor - Skilled Clinical Observations: grossly uncoordinated due to pain, generalized weakness, decreased balance/postural control, and L leg length discrepancy. Mobility  Bed Mobility Bed Mobility: Rolling Right;Rolling Left;Sit to Supine;Supine to Sit Rolling Right: Independent with assistive device Rolling Left: Independent with assistive device Supine to Sit: Independent with assistive device Sit to Supine: Independent with assistive device Transfers Sit to Stand: Contact Guard/Touching assist (// bars while maintaining LLE NWB precautions) Stand to Sit: Supervision/Verbal cueing (// bars while maintaining LLE NWB precautions)  Trunk/Postural Assessment  Cervical Assessment Cervical Assessment: Within Functional Limits Thoracic Assessment Thoracic Assessment: Exceptions to  [facility] (mild kyphosis) Lumbar Assessment Lumbar Assessment: Exceptions to [facility] (posterior pelvic tilt in sitting) Postural Control Postural Control: Deficits on evaluation  Balance Balance Balance Assessed: Yes Static Sitting Balance Static Sitting - Balance Support: Feet supported;No upper extremity supported Static Sitting - Level of Assistance: 7: Independent Dynamic Sitting Balance Dynamic Sitting - Balance Support: Feet supported;No upper extremity supported Dynamic Sitting - Level of Assistance: 6: Modified independent (Device/Increase time) Static Standing Balance  Static Standing - Balance Support: Bilateral upper extremity supported (// bars) Static Standing - Level of Assistance: 5: Stand by assistance (close supervision) Extremity/Trunk Assessment RUE Assessment RUE Assessment: Within Functional Limits Active Range of Motion (AROM) Comments: WNL General Strength Comments: grossly 4+/5 LUE Assessment LUE Assessment: Within Functional Limits Active Range of Motion (AROM) Comments: WNL General Strength Comments: grossly 4+/5   Max Pittman, [facility] 04/10/2020, 8:57 AM

## 2020-04-11 ENCOUNTER — Other Ambulatory Visit: Payer: Self-pay | Admitting: Physical Medicine and Rehabilitation

## 2020-04-11 MED ORDER — MUSCLE RUB 10-15 % EX CREA: 1.0000 "application " | TOPICAL_CREAM | Freq: Three times a day (TID) | CUTANEOUS | 0 refills | Status: DC

## 2020-04-11 MED ORDER — CEPHALEXIN 250 MG PO CAPS: 250.0000 mg | ORAL_CAPSULE | Freq: Two times a day (BID) | ORAL | 0 refills | Status: DC

## 2020-04-11 MED ORDER — MECLIZINE HCL 25 MG PO TABS: 25.0000 mg | ORAL_TABLET | Freq: Three times a day (TID) | ORAL | 0 refills | Status: DC

## 2020-04-11 MED ORDER — MELATONIN 5 MG PO TABS: 5.0000 mg | ORAL_TABLET | Freq: Every day | ORAL | 0 refills | Status: DC

## 2020-04-11 MED ORDER — PREGABALIN 75 MG PO CAPS: 75.0000 mg | ORAL_CAPSULE | Freq: Every day | ORAL | 0 refills | Status: DC

## 2020-04-11 MED ORDER — DOCUSATE SODIUM 100 MG PO CAPS: 100.0000 mg | ORAL_CAPSULE | Freq: Two times a day (BID) | ORAL | 0 refills | Status: DC

## 2020-04-11 MED ORDER — PROSOURCE PLUS PO LIQD: 30.0000 mL | Freq: Two times a day (BID) | ORAL | 0 refills | Status: DC

## 2020-04-11 MED ORDER — ATORVASTATIN CALCIUM 40 MG PO TABS: 40.0000 mg | ORAL_TABLET | Freq: Every day | ORAL | 0 refills | Status: DC

## 2020-04-11 MED ORDER — OXYCODONE-ACETAMINOPHEN 5-325 MG PO TABS: 1.0000 | ORAL_TABLET | Freq: Four times a day (QID) | ORAL | 0 refills | Status: DC | PRN

## 2020-04-11 MED ORDER — METOPROLOL TARTRATE 25 MG PO TABS: 12.5000 mg | ORAL_TABLET | Freq: Two times a day (BID) | ORAL | 0 refills | Status: AC

## 2020-04-11 MED ORDER — CAMPHOR-MENTHOL 0.5-0.5 % EX LOTN: TOPICAL_LOTION | CUTANEOUS | 0 refills | Status: DC | PRN

## 2020-04-11 MED ORDER — SEVELAMER CARBONATE 800 MG PO TABS: 1600.0000 mg | ORAL_TABLET | Freq: Three times a day (TID) | ORAL | 0 refills | Status: AC

## 2020-04-11 MED ORDER — FLUOXETINE HCL 10 MG PO TABS
10.0000 mg | ORAL_TABLET | Freq: Every day | ORAL | 0 refills | Status: DC
Start: 2020-04-11 — End: 2020-11-21

## 2020-04-11 MED ORDER — AMITRIPTYLINE HCL 50 MG PO TABS: 50.0000 mg | ORAL_TABLET | Freq: Every day | ORAL | 0 refills | Status: DC

## 2020-04-11 MED ORDER — PANTOPRAZOLE SODIUM 40 MG PO TBEC: 40.0000 mg | DELAYED_RELEASE_TABLET | Freq: Every day | ORAL | 0 refills | Status: DC

## 2020-04-11 MED ORDER — ISOSORBIDE MONONITRATE ER 30 MG PO TB24
15.0000 mg | ORAL_TABLET | Freq: Every day | ORAL | 0 refills | Status: DC
Start: 2020-04-12 — End: 2020-05-24

## 2020-04-11 MED ORDER — RENA-VITE PO TABS
1.0000 | ORAL_TABLET | Freq: Every day | ORAL | 0 refills | Status: AC
Start: 2020-04-11 — End: ?

## 2020-04-11 MED ORDER — POLYETHYLENE GLYCOL 3350 17 G PO PACK: 17.0000 g | PACK | Freq: Every day | ORAL | 0 refills | Status: AC

## 2020-04-11 NOTE — Progress Notes (Signed)
[facility] Progress Note   Subjective: Going home today. L AVF with strong bruit, no steal symptoms. Bruising present.  No C/Os. Tolerated HD well yesterday, post wt 118 kgs.   Objective Vitals:   04/10/20 1735 04/10/20 1808 04/10/20 1900 04/11/20 0530  BP: (!) 105/55 105/62 90/69 112/69  Pulse: 98 96 100 89  Resp: '16 18 18 18  '$ Temp: 98.1 F (36.7 C) 98.5 F (36.9 C) 99.1 F (37.3 C) 98.6 F (37 C)  TempSrc: Oral Oral Oral Oral  SpO2: 98% 99% 98% 98%  Weight:    118 kg  Height:       Physical Exam General:Pleasant, WN, WD male in NAD Heart:S1,S2, RRR No M/R/G No JVD Lungs:CTAB A/P Abdomen:Active BS Extremities:No LE edema Dialysis Access:New L AVF bruising along suture line strong bruit throughout. RIJ TDC drsg CDI.No steal symptoms.  Dialysis Orders:  Additional Objective Labs: Basic Metabolic Panel: Recent Labs  Lab 04/05/20 1311 04/08/20 0825  NA 137 137  K 4.4 4.9  CL 100 100  CO2 28  --   GLUCOSE 133* 93  BUN 39* 56*  CREATININE 8.34* 9.10*  CALCIUM 9.5  --   PHOS 2.1*  --    Liver Function Tests: Recent Labs  Lab 04/05/20 1311  ALBUMIN 2.3*   No results for input(s): LIPASE, AMYLASE in the last 168 hours. CBC: Recent Labs  Lab 04/05/20 1311 04/08/20 0825  WBC 9.4  --   HGB 8.7* 10.2*  HCT 28.2* 30.0*  MCV 91.9  --   PLT 278  --    Blood Culture    Component Value Date/Time   SDES PERITONEAL DIALYSATE 03/20/2020 0812   SPECREQUEST Immunocompromised 03/20/2020 0812   CULT  03/20/2020 0812    NO GROWTH 3 DAYS Performed at [facility], [place] [address] [place], [place] [address]    REPTSTATUS 03/23/2020 FINAL 03/20/2020 [id number]    Cardiac Enzymes: No results for input(s): CKTOTAL, CKMB, CKMBINDEX, TROPONINI in the last 168 hours. CBG: Recent Labs  Lab 04/08/20 1019  GLUCAP 107*   Iron Studies: No results for input(s): IRON, TIBC, TRANSFERRIN, FERRITIN in the last 72  hours. '@lablastinr3'$ @ Studies/Results: No results found. Medications: . ferric gluconate (FERRLECIT/NULECIT) IV Stopped (04/10/20 1658)   . (feeding supplement) PROSource Plus  30 mL Oral BID BM  . amitriptyline  50 mg Oral QHS  . aspirin EC  81 mg Oral Daily  . atorvastatin  40 mg Oral q1800  . buPROPion  150 mg Oral BID WC  . cephALEXin  250 mg Oral Q12H  . Chlorhexidine Gluconate Cloth  6 each Topical Q0600  . darbepoetin (ARANESP) injection - DIALYSIS  100 mcg Intravenous Q Fri-HD  . docusate sodium  100 mg Oral BID  . feeding supplement (NEPRO CARB STEADY)  237 mL Oral TID BM  . FLUoxetine  10 mg Oral Daily  . isosorbide mononitrate  15 mg Oral Daily  . lamoTRIgine  100 mg Oral BID  . levETIRAcetam  500 mg Oral Daily  . loratadine  10 mg Oral Daily  . meclizine  25 mg Oral TID  . mouth rinse  15 mL Mouth Rinse BID  . melatonin  5 mg Oral QHS  . metoprolol tartrate  12.5 mg Oral BID  . multivitamin  1 tablet Oral QHS  . Muscle Rub   Topical TID WC & HS  . pantoprazole  40 mg Oral Daily  . polyethylene glycol  17 g Oral Daily  .  pregabalin  75 mg Oral QHS  . prochlorperazine  10 mg Oral Q8H   Or  . prochlorperazine  5-10 mg Intramuscular Q8H   Or  . prochlorperazine  12.5 mg Rectal Q8H  . sevelamer carbonate  1,600 mg Oral TID WC     Assessment/Plan: 1. Acute hypoxic respiratory failure/ cardiac arrest: due to severe volume overload/pulmonary edema causing asystolic cardiac arrest. Resolved. Now in rehab due to deconditioning 2. ESRD: severe vol overload on admission. Had CRRT w/ 31 L removed. 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 Transitioned to HD. IR placed TDC on 2/23 and PD cath removed 2/25 per gen surgery.Tentatively has OP spot at [facility] on MWF early AM. Next HD 3/16 3. HD access:New basilic vein transposition per Dr. Scot Dock 04/08/20. + Bruit. 4. Retroperitoneal hematoma: seen by CT 2/24 large R iliopsoas hematoma 10 x 7 x 20 cm.  Anticoagulation on hold.  5. Anemia ckd: Hb10.2 03/14 (H & H), getting darbe 100 ug q week. Last 3/11. Tsat on 3/5 was 9% >ordered IV Fe bolus per pharm.  6. CKD-MBD: Corr Ca elevated. Use 2 Ca bath on HD. [place], decrease binder dose.on Renvela. PTH 51 7. Nutrition: On renal diet. Add prot supp 8. Hypertension: BP controlled. Euvolemic on exam, no edema. UF as tolerated.  9. 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 H/o CVA/ hx seizures - taking lamictal and keppra 11. Dispo - on inpatient rehab. Plans to DC home Thursday.  Leidy Massar H. Laine Giovanetti NP-C 04/11/2020, 11:46 AM  [employer] [phone]

## 2020-04-11 NOTE — Discharge Instructions (Signed)
Inpatient Rehab Discharge Instructions  Max Pittman. Discharge date and time:  04/11/20  Activities/Precautions/ Functional Status: Activity: no lifting, driving, or strenuous exercise till cleared  Diet: renal diet Wound Care: keep wound clean and dry   Functional status:  ___ No restrictions     ___ Walk up steps independently _X__ 24/7 supervision/assistance   ___ Walk up steps with assistance ___ Intermittent supervision/assistance  ___ Bathe/dress independently ___ Walk with walker     _X__ Bathe/dress with assistance ___ Walk Independently    ___ Shower independently ___ Walk with assistance    ___ Shower with assistance _X__ No alcohol     ___ Return to work/school ________   COMMUNITY REFERRALS UPON DISCHARGE:    Home Health:   PT     OT                    Agency: [facility] Phone: [phone]    Medical Equipment/Items Ordered: Slide Board                                                 Agency/Supplier: [employer]   Special Instructions:    My questions have been answered and I understand these instructions. I will adhere to these goals and the provided educational materials after my discharge from the hospital.  Patient/Caregiver Signature _______________________________ Date __________  Clinician Signature _______________________________________ Date __________  Please bring this form and your medication list with you to all your follow-up doctor's appointments.     [facility]  Discharge Instructions  AV Fistula or Graft Surgery for Dialysis Access  Please refer to the following instructions for your post-procedure care. Your surgeon or physician assistant will discuss any changes with you.  Activity  You may drive the day following your surgery, if you are comfortable and no longer taking prescription pain medication. Resume full activity as the soreness in your incision  resolves.  Bathing/Showering  You may shower after you go home. Keep your incision dry for 48 hours. Do not soak in a bathtub, hot tub, or swim until the incision heals completely. You may not shower if you have a hemodialysis catheter.  Incision Care  Clean your incision with mild soap and water after 48 hours. Pat the area dry with a clean towel. You do not need a bandage unless otherwise instructed. Do not apply any ointments or creams to your incision. You may have skin glue on your incision. Do not peel it off. It will come off on its own in about one week. Your arm may swell a bit after surgery. To reduce swelling use pillows to elevate your arm so it is above your heart. Your doctor will tell you if you need to lightly wrap your arm with an ACE bandage.  Diet  Resume your normal diet. There are not special food restrictions following this procedure. In order to heal from your surgery, it is CRITICAL to get adequate nutrition. Your body requires vitamins, minerals, and protein. Vegetables are the best source of vitamins and minerals. Vegetables also provide the perfect balance of protein. Processed food has little nutritional value, so try to avoid this.  Medications  Resume taking all of your medications. If your incision is causing pain, you may take over-the counter pain relievers such as acetaminophen (  Tylenol). If you were prescribed a stronger pain medication, please be aware these medications can cause nausea and constipation. Prevent nausea by taking the medication with a snack or meal. Avoid constipation by drinking plenty of fluids and eating foods with high amount of fiber, such as fruits, vegetables, and grains. Do not take Tylenol if you are taking prescription pain medications.     Follow up Your surgeon may want to see you in the office following your access surgery. If so, this will be arranged at the time of your surgery.  Please call us immediately for any of the  following conditions:  Increased pain, redness, drainage (pus) from your incision site Fever of 101 degrees or higher Severe or worsening pain at your incision site Hand pain or numbness.  Reduce your risk of vascular disease:  Stop smoking. If you would like help, call [facility] at 1-800-QUIT-NOW 701-446-4377) or [place] [facility] [place] your cholesterol Maintain a desired weight Control your diabetes Keep your blood pressure down  Dialysis  It will take several weeks to several months for your new dialysis access to be ready for use. Your surgeon will determine when it is OK to use it. Your nephrologist will continue to direct your dialysis. You can continue to use your Permcath until your new access is ready for use.  If you have any questions, please call the office at [phone].

## 2020-04-11 NOTE — Progress Notes (Signed)
PROGRESS NOTE   Subjective/Complaints: No new complaints this morning Ready for discharge Asks about follow-up appointments and medications.  Pain is currently stable with current regimen.   ROS: Denies CP, SOB, [name], dizziness and nausea are better controlled.   Objective:   No results found. No results for input(s): WBC, HGB, HCT, PLT in the last 72 hours. No results for input(s): NA, K, CL, CO2, GLUCOSE, BUN, CREATININE, CALCIUM in the last 72 hours.  Intake/Output Summary (Last 24 hours) at 04/11/2020 0914 Last data filed at 04/11/2020 0700 Gross per 24 hour  Intake 746 ml  Output 2500 ml  Net -1754 ml        Physical Exam: Vital Signs Blood pressure 112/69, pulse 89, temperature 98.6 F (37 C), temperature source Oral, resp. rate 18, height '5\' 9"'$  (1.753 m), weight 118 kg, SpO2 98 %. Gen: no distress, normal appearing HEENT: oral mucosa pink and moist, NCAT Cardio: Reg rate Chest: normal effort, normal rate of breathing Abd: soft, non-distended Ext: no edema Psych: pleasant, normal affect Skin: no new bleeding from access site.  Musc: Right lower extremity edema Bilateral lower extremity tenderness, mild Neuro: Alert Motor: Bilateral upper extremities: 4+/5 proximal distal RLE limited by pain 3/5 prox to 4/5 distally. LLE 3/5 prox to 4/5 distally.   Assessment/Plan: 1. Functional deficits which require 3+ hours per day of interdisciplinary therapy in a comprehensive inpatient rehab setting.  Physiatrist is providing close team supervision and 24 hour management of active medical problems listed below.  Physiatrist and rehab team continue to assess barriers to discharge/monitor patient progress toward functional and medical goals  Care Tool:  Bathing    Body parts bathed by patient: Right arm,Left arm,Chest,Abdomen,Front perineal area,Buttocks,Right upper leg,Left upper leg,Right lower leg,Left lower  leg,Face         Bathing assist Assist Level: Supervision/Verbal cueing     Upper Body Dressing/Undressing Upper body dressing   What is the patient wearing?: Pull over shirt    Upper body assist Assist Level: Set up assist    Lower Body Dressing/Undressing Lower body dressing      What is the patient wearing?: Pants,Underwear/pull up     Lower body assist Assist for lower body dressing: Supervision/Verbal cueing     Toileting Toileting    Toileting assist Assist for toileting: Supervision/Verbal cueing Assistive Device Comment: drop arm BSC   Transfers Chair/bed transfer  Transfers assist     Chair/bed transfer assist level: Supervision/Verbal cueing     Locomotion Ambulation   Ambulation assist   Ambulation activity did not occur: Safety/medical concerns (LLE NWB status, RLE hematoma, and R knee buckling. Generalized weakness and decreased balance)  Assist level: 2 helpers Assistive device: Walker-rolling Max distance: 50   Walk 10 feet activity   Assist  Walk 10 feet activity did not occur: Safety/medical concerns (LLE NWB status, RLE hematoma, and R knee buckling. Generalized weakness and decreased balance)  Assist level: 2 helpers Assistive device: Walker-rolling   Walk 50 feet activity   Assist Walk 50 feet with 2 turns activity did not occur: Safety/medical concerns (LLE NWB status, RLE hematoma, and R knee buckling. Generalized weakness and decreased balance)  Assist level: 2 helpers Assistive device: Walker-rolling    Walk 150 feet activity   Assist Walk 150 feet activity did not occur: Safety/medical concerns (LLE NWB status, RLE hematoma, and R knee buckling. Generalized weakness and decreased balance)         Walk 10 feet on uneven surface  activity   Assist Walk 10 feet on uneven surfaces activity did not occur: Safety/medical concerns (LLE NWB status, RLE hematoma, and R knee buckling. Generalized weakness and decreased  balance)         Wheelchair     Assist Will patient use wheelchair at discharge?: Yes Type of Wheelchair: Manual    Wheelchair assist level: Independent Max wheelchair distance: 75    Wheelchair 50 feet with 2 turns activity    Assist        Assist Level: Independent   Wheelchair 150 feet activity     Assist      Assist Level: Independent   Blood pressure 112/69, pulse 89, temperature 98.6 F (37 C), temperature source Oral, resp. rate 18, height '5\' 9"'$  (1.753 m), weight 118 kg, SpO2 98 %.  Medical Problem List and Plan: 1.Functional and mobility deficitssecondary to debility after cardiac arrest, volume overload multiple medical.  Dc home today 2. Antithrombotics: -DVT/anticoagulation:Mechanical:Sequential compression devices, below kneeBilateral lower extremities -antiplatelet therapy: Aspirin resumed March 7th, continue 3. Pain Management:  TENS unit. Encouraged heat for lower back  Continue amitriptyline '25mg'$    Controlled on 3/17, continue Percocet PRN. Discussed f/u with outpatient pain provider.  4. Mood:LCSW to follow for evaluation and support. -antipsychotic agents: N/A 5. Neuropsych: This patientiscapable of making decisions on hisown behalf. 6. Skin/Wound Care:Routine pressure relief measures. 7. Fluids/Electrolytes/Nutrition:Strict I/Os. Monitor weights daily. Renal diet with 1200 cc/FR --consult dietician to educate patient on renal diet.  8.  ESRD: Has been transitioned to HD--clipped for MWF at [facility].  --schedule HD at the end of the day to help with tolerance of therapy. New access obtained 3/14 9. H/o Seizure d/o: Continue Lamictal and Keppra--monitor for breakthrough seizures.   No breakthrough seizures since admission to rehab 10. T2DM: Hgb A1c-5.6 and diet controlled for the past 9 months.  Well controlled: may d/c checks 11. HTN: Continue  Amlodipine, metoprolol and Imdur and titrate medications as indicated.   Controlled on 3/17- continue current regimen.   12. Spontaneous, large right iliopsoashematoma:  Leucocytosis resolved, continue to monitor with HD. Afebrile. Hgb stable at 8.5 -pain control as above 13. 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  -consult with ortho while here regarding considerations. Not a surgical candidate at this point, and he might never be unfortunately.  -pain control and activity modification.  14. OSA: Non-compliant with CPAP. 15. Acute on chronic anemia: Continue to monitor H/H. ON aranesp weekly.   Hgb down to 8.7 on 3/11, continue to monitor 16.Neuropathy: acute on chronic now with worsening of symptoms RLE due to compression.  See #3 17. Dizziness: meclizine and vestibular eval ordered. Advised to ask for Meclizine PRN if needed.    Scopolamine patch d/ced since not beneficial.    Schedule Meclizine '25mg'$  TID- this has helped cut dizziness by half 18. Tachycardia  Relatively controlled on 3/12 19. Nausea: EKG to assess qTC. Continue Compazine to '10mg'$  q8H.  Improved 20. 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   21. Plan for d/c  on 3/17- patient agreeable. Discussed outpatient follow-up with ortho (patient to schedule) and me (scheduled)   >30 minutes spent in discharge of patient including review of medications and follow-up appointments, physical examination, and in answering all patient's questions  LOS: 16 days A FACE TO [place] 04/11/2020, 9:14 AM

## 2020-04-11 NOTE — Progress Notes (Signed)
Patient discharged home with significant other.  Sliding board was delivered to hospital and sent home with patient.

## 2020-04-11 NOTE — Progress Notes (Signed)
Inpatient Rehabilitation Care Coordinator Discharge Note  The overall goal for the admission was met for:   Discharge location: Yes, home  Length of Stay: Yes, 16 days  Discharge activity level: Yes  Home/community participation: Yes  Services provided included: MD, RD, PT, OT, SLP, RN, CM, TR, Pharmacy, Neuropsych and SW  Financial Services: Medicare  Choices offered to/list presented BT:DVVOHYW and S.O.  Follow-up services arranged: Home Health: [place] (or additional information): PT OT   Patient/Family verbalized understanding of follow-up arrangements: Yes  Individual responsible for coordination of the follow-up plan: [name] [phone]  Confirmed correct DME delivered: Dyanne Iha 04/11/2020    Dyanne Iha

## 2020-04-11 NOTE — Discharge Summary (Signed)
Physician Discharge Summary  Patient ID: Max Pittman. MRN: [id number] DOB/AGE: May 21, 1975 45 y.o.  Admit date: 03/26/2020 Discharge date: 04/11/2020  Discharge Diagnoses:  Principal Problem:   Debility Active Problems:   Diabetes mellitus ([place])   Hardware complicating wound infection ([place])   Sinus tachycardia   Acute on chronic anemia   ESRD on dialysis ([facility])   Seizures ([facility])   Neuropathy of right lower extremity   Dizziness   Discharged Condition:  Stable   Significant Diagnostic Studies: N/A   Labs:  Basic Metabolic Panel: BMP Latest Ref Rng & Units 04/08/2020 04/05/2020 04/03/2020  Glucose 70 - 99 mg/dL 93 133(H) 100(H)  BUN 6 - 20 mg/dL 56(H) 39(H) 40(H)  Creatinine 0.61 - 1.24 mg/dL 9.10(H) 8.34(H) 8.20(H)  BUN/Creat Ratio 6 - 22 (calc) - - -  Sodium 135 - 145 mmol/L 137 137 135  Potassium 3.5 - 5.1 mmol/L 4.9 4.4 4.2  Chloride 98 - 111 mmol/L 100 100 98  CO2 22 - 32 mmol/L - 28 25  Calcium 8.9 - 10.3 mg/dL - 9.5 9.8    CBC: CBC Latest Ref Rng & Units 04/08/2020 04/05/2020 04/03/2020  WBC 4.0 - 10.5 K/uL - 9.4 8.6  Hemoglobin 13.0 - 17.0 g/dL 10.2(L) 8.7(L) 9.3(L)  Hematocrit 39.0 - 52.0 % 30.0(L) 28.2(L) 30.5(L)  Platelets 150 - 400 K/uL - 278 326    CBG: Recent Labs  Lab 04/08/20 1019  GLUCAP 107*    Brief HPI:   Max Awwad. is a 45 y.o. male with history of T2DM, HTN, ESRD-on PD, CVA, seizure disorder, left hip fracture s/p repair with chronic left hip infection--on Keflex for suppression, DVT, who was admitted on 03/07/2020 with cardiac arrest secondary to "massive volume overload".  He received 12 minutes of CPR with ROSC, was intubated in ED and treated with cooling protocol.  Per records, patient had been having difficulty fluid management with concerns of PAD failure prior to admission. He was started on CRRT to manage fluid overload, required pressors for BP support and was treated with antibiotics due to concerns of aspiration pneumonia.   Asystolic arrest felt to be due to fluid overload. He self extubated on 02/14 and did not tolerate BIPAP but respiratory status was noted to be  stable.  Hospital course significant for recurrent rise in WBC and was briefly treated with Ancef.  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  He was transfused with 1 units for acute on chronic blood loss anemia and aspirin/Coumadin was discontinued and he had completed greater than 6 months of treatment for his DVT.  He was transitioned to HD with  recommendations of fistula placement prior to DC.  He was noted to have debility, RLE>LLE  pain, knee instability as well as weakness/nausea with standing attempts.  CIR was recommended due to functional decline.   Hospital Course: Max Whalley. was admitted to rehab 03/26/2020 for inpatient therapies to consist of PTand OT at least three hours five days a week. Past admission physiatrist, therapy team and rehab RN have worked together to provide customized collaborative inpatient rehab. Blood pressures and heart rate were monitored on TID basis and has been relatively controlled. Blood sugars were monitored briefly and cbg checks d/c as these were controlled on diet alone and  A1C-5.6  showed good overall control PTA. Follow up CBC showed that leucocytosis has resolved and H/H was slowly improving on weekly aranesp supplementation.   He reported dizziness and nausea which has improved with addition of meclizine tid and scopolamine patch was discontinued as it was ineffective. He reported ongoing issues with neuropathy and poor pain control. Percocet was titrated to 10 mg prn and amitriptyline. TENS unit with heat prn was added to help manage back pain.   He underwent left AVF creation by Dr. Scot Dock on 03/14.  Left AC  incision is healing well with mild ecchymosis but is C/D/I at discharge. HD has been ongoing at the end of the day to help with therapy tolerance and he has been educated on renal diet as well as importance of fluid restriction. He has been seizure free on home regimen.   Ortho was consulted for input on left hip as well as restrictions as needed and reported that patient had osteolysis with "vaporization of femoral head" with hardware failure therefore NWB recommended. They will follow up with patient regarding referral to [place] after discharge. Patient has had difficulty maintaining NWB LLE due to right knee weakness. Gait was discontinued and SB used to assist with transfers. Pain control has greatly improved with improvement in endurance and activity tolerance. He is modified independent at wheelchair levela and will continue to receive follow up HHPT and [place] by [place] after discharge.     Rehab course: During patient's stay in rehab weekly team conferences were held to monitor patient's progress, set goals and discuss barriers to discharge. At admission, patient required mod assist with basic self care needs and with mobility.  He  has had improvement in activity tolerance, balance, postural control as well as ability to compensate for deficits. He is able to complete ADL tasks with supervision. He is able to perform transfers at modified independent to supervision level.  He is able to propel himself for 44' and is limited by fatigue. Family education was completed with [name] (significant other) regarding all aspects of care and safety.   Disposition: Home  Diet: Renal diet/1200 cc/FR.  Special Instructions: 1. Continue NWB LLE and to follow up with ortho regarding [facility] appointment. 2.   Allergies as of 04/11/2020      Reactions   Doxycycline Hives   Latex Swelling   Pt reports swelling at site.   Morphine And Related Anxiety      Medication List    STOP  taking these medications   acetaminophen 325 MG tablet Commonly known as: TYLENOL   amLODipine 5 MG tablet Commonly known as: NORVASC   baclofen 10 MG tablet Commonly known as: LIORESAL   hydrOXYzine 25 MG tablet Commonly known as: ATARAX/VISTARIL   omeprazole 40 MG capsule Commonly known as: PRILOSEC   pioglitazone 15 MG tablet Commonly known as: ACTOS     TAKE these medications   (feeding supplement) PROSource Plus liquid Take 30 mLs by mouth 2 (two) times daily between meals.   amitriptyline 50 MG tablet Commonly known as: ELAVIL Take 1 tablet (50 mg total) by mouth at bedtime.   aspirin 81 MG EC tablet Take 1 tablet (81 mg total) by mouth daily.   atorvastatin 40 MG tablet Commonly known as: LIPITOR Take 1 tablet (40 mg total) by mouth daily at 6 PM.   buPROPion 150 MG 12 hr tablet Commonly known as: WELLBUTRIN SR Take 1 tablet (150 mg total) by mouth 2 (  two) times daily with a meal.   camphor-menthol lotion Commonly known as: SARNA Apply topically as needed for itching.   cephALEXin 250 MG capsule Commonly known as: Keflex Take 1 capsule (250 mg total) by mouth every 12 (twelve) hours.   docusate sodium 100 MG capsule Commonly known as: COLACE Take 1 capsule (100 mg total) by mouth 2 (two) times daily. What changed:   how much to take  when to take this  reasons to take this  additional instructions   FLUoxetine 10 MG tablet Commonly known as: PROZAC Take 1 tablet (10 mg total) by mouth daily.   isosorbide mononitrate 30 MG 24 hr tablet Commonly known as: IMDUR Take 0.5 tablets (15 mg total) by mouth daily.   lamoTRIgine 100 MG tablet Commonly known as: LaMICtal Take 1 tablet (100 mg total) by mouth 2 (two) times daily.   levETIRAcetam 500 MG tablet Commonly known as: KEPPRA Take 1 tablet (500 mg total) by mouth daily. What changed: when to take this   loratadine 10 MG tablet Commonly known as: CLARITIN Take 1 tablet (10 mg total) by  mouth daily.   meclizine 25 MG tablet Commonly known as: ANTIVERT Take 1 tablet (25 mg total) by mouth 3 (three) times daily.   melatonin 5 MG Tabs Take 1 tablet (5 mg total) by mouth at bedtime.   metoprolol tartrate 25 MG tablet Commonly known as: LOPRESSOR Take 0.5 tablets (12.5 mg total) by mouth 2 (two) times daily.   multivitamin Tabs tablet Take 1 tablet by mouth at bedtime.   Muscle Rub 10-15 % Crea Apply 1 application topically 4 (four) times daily -  with meals and at bedtime.   oxyCODONE-acetaminophen 5-325 MG tablet--Rx#57 pills Commonly known as: PERCOCET/ROXICET Take 1-2 tablets by mouth every 6 (six) hours as needed for severe pain. What changed:   how much to take  reasons to take this   pantoprazole 40 MG tablet Commonly known as: PROTONIX Take 1 tablet (40 mg total) by mouth daily.   polyethylene glycol 17 g packet Commonly known as: MIRALAX / GLYCOLAX Take 17 g by mouth daily.   pregabalin 75 MG capsule Commonly known as: LYRICA Take 1 capsule (75 mg total) by mouth at bedtime. What changed:   medication strength  how much to take   sevelamer carbonate 800 MG tablet Commonly known as: RENVELA Take 2 tablets (1,600 mg total) by mouth 3 (three) times daily with meals. What changed: how much to take       Follow-up Information    Chesley Noon, MD Follow up.   Specialty: Family Medicine Contact information: [place] [address] [phone]        Izora Ribas, MD Follow up.   Specialty: Physical Medicine and Rehabilitation Why:  05/23/20 please arrive at 11:00 for 11:20 appointment Contact information: [address] [place] [address] [phone]        Altamese [place], MD.   Specialty: Orthopedic Surgery Contact information: [place] [address] [phone]        Angelia Mould, MD.   Specialties: Vascular Surgery, Cardiology Why: office will  call you to arrange your appt (sent) Contact information: [address] [place] [address] [phone]        Elfredia Nevins, NP. Call.   Specialty: Nurse Practitioner Why: for follow up on pain management/refills Contact information: [place] [address] [phone]  Signed: Bary Leriche 04/12/2020, 7:22 PM

## 2020-04-12 DIAGNOSIS — R42 Dizziness and giddiness: Secondary | ICD-10-CM

## 2020-04-12 DIAGNOSIS — G5791 Unspecified mononeuropathy of right lower limb: Secondary | ICD-10-CM

## 2020-04-12 LAB — CUP PACEART REMOTE DEVICE CHECK
Date Time Interrogation Session: 20220317202124
Implantable Pulse Generator Implant Date: 20210429

## 2020-04-15 ENCOUNTER — Ambulatory Visit (INDEPENDENT_AMBULATORY_CARE_PROVIDER_SITE_OTHER): Payer: Medicare Other

## 2020-04-15 DIAGNOSIS — I469 Cardiac arrest, cause unspecified: Secondary | ICD-10-CM

## 2020-04-15 NOTE — Telephone Encounter (Signed)
Called pt and he accepted f/u appointment on 04/23/20 at 11:15.

## 2020-04-23 ENCOUNTER — Ambulatory Visit (INDEPENDENT_AMBULATORY_CARE_PROVIDER_SITE_OTHER): Payer: Medicare Other | Admitting: Adult Health

## 2020-04-23 ENCOUNTER — Encounter: Payer: Self-pay | Admitting: Adult Health

## 2020-04-23 ENCOUNTER — Other Ambulatory Visit: Payer: Self-pay

## 2020-04-23 VITALS — BP 149/84 | HR 100 | Ht 69.0 in | Wt 260.0 lb

## 2020-04-23 DIAGNOSIS — R569 Unspecified convulsions: Secondary | ICD-10-CM | POA: Diagnosis not present

## 2020-04-23 DIAGNOSIS — I639 Cerebral infarction, unspecified: Secondary | ICD-10-CM | POA: Diagnosis not present

## 2020-04-23 DIAGNOSIS — Z5181 Encounter for therapeutic drug level monitoring: Secondary | ICD-10-CM

## 2020-04-23 NOTE — Patient Instructions (Signed)
Continue Keppra XR '500mg'$  daily and lamotrigine 100 mg twice daily for seizure prevention  Obtain lab work with use of lamotrigine  Continue to monitor head numbness - unknown cause - please let me know if symptoms should worsen  Continue aspirin 81 mg daily  and atorvastatin 40 mg daily for secondary stroke prevention  Continue to follow up with PCP regarding cholesterol and blood pressure management  Maintain strict control of hypertension with blood pressure goal below 130/90, diabetes with hemoglobin A1c goal below 6.5% and cholesterol with LDL cholesterol (bad cholesterol) goal below 70 mg/dL.       Followup in the future with me in 6 months or call earlier if needed       Thank you for coming to see [place] at [facility]. I hope we have been able to provide you high quality care today.  You may receive a patient satisfaction survey over the next few weeks. We would appreciate your feedback and comments so that we may continue to improve ourselves and the health of our patients.

## 2020-04-23 NOTE — Progress Notes (Signed)
[facility] Summary Report / Loop Recorder 

## 2020-04-23 NOTE — Progress Notes (Signed)
I agree with the above plan 
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
## 2020-04-24 ENCOUNTER — Ambulatory Visit: Payer: Self-pay | Admitting: *Deleted

## 2020-04-24 LAB — LAMOTRIGINE LEVEL: Lamotrigine Lvl: 2.6 ug/mL (ref 2.0–20.0)

## 2020-04-26 NOTE — Progress Notes (Addendum)
Nocturnal bradycardia is noted.  Continue to monitor.    EGM for episode:

## 2020-04-30 ENCOUNTER — Other Ambulatory Visit: Payer: Self-pay

## 2020-04-30 DIAGNOSIS — N186 End stage renal disease: Secondary | ICD-10-CM

## 2020-05-02 ENCOUNTER — Other Ambulatory Visit: Payer: Self-pay | Admitting: Physical Medicine and Rehabilitation

## 2020-05-03 ENCOUNTER — Other Ambulatory Visit: Payer: Self-pay | Admitting: Physical Medicine and Rehabilitation

## 2020-05-09 ENCOUNTER — Other Ambulatory Visit: Payer: Self-pay | Admitting: Physical Medicine and Rehabilitation

## 2020-05-10 ENCOUNTER — Other Ambulatory Visit: Payer: Self-pay | Admitting: Physical Medicine and Rehabilitation

## 2020-05-14 ENCOUNTER — Other Ambulatory Visit: Payer: Self-pay | Admitting: Physical Medicine and Rehabilitation

## 2020-05-15 ENCOUNTER — Ambulatory Visit (HOSPITAL_COMMUNITY): Payer: Medicare Other

## 2020-05-16 ENCOUNTER — Ambulatory Visit (INDEPENDENT_AMBULATORY_CARE_PROVIDER_SITE_OTHER): Payer: Medicare Other

## 2020-05-16 DIAGNOSIS — I63019 Cerebral infarction due to thrombosis of unspecified vertebral artery: Secondary | ICD-10-CM | POA: Diagnosis not present

## 2020-05-16 LAB — CUP PACEART REMOTE DEVICE CHECK
Date Time Interrogation Session: 20220419202326
Implantable Pulse Generator Implant Date: 20210429

## 2020-05-17 ENCOUNTER — Other Ambulatory Visit: Payer: Self-pay | Admitting: Physical Medicine and Rehabilitation

## 2020-05-19 ENCOUNTER — Other Ambulatory Visit: Payer: Self-pay | Admitting: Physical Medicine and Rehabilitation

## 2020-05-21 ENCOUNTER — Telehealth: Payer: Self-pay | Admitting: Physical Medicine and Rehabilitation

## 2020-05-21 NOTE — Telephone Encounter (Signed)
Please send this request to Dr Ranell Patrick, he has a scheduled appointment with Dr Ranell Patrick.

## 2020-05-21 NOTE — Telephone Encounter (Signed)
Refill request for Imdur.

## 2020-05-23 ENCOUNTER — Encounter
Payer: Medicare Other | Attending: Physical Medicine and Rehabilitation | Admitting: Physical Medicine and Rehabilitation

## 2020-05-23 ENCOUNTER — Other Ambulatory Visit: Payer: Self-pay | Admitting: Adult Health

## 2020-05-23 ENCOUNTER — Encounter: Payer: Self-pay | Admitting: Physical Medicine and Rehabilitation

## 2020-05-23 ENCOUNTER — Other Ambulatory Visit: Payer: Self-pay

## 2020-05-23 VITALS — BP 122/79 | HR 84 | Temp 98.6°F | Ht 63.0 in | Wt 281.6 lb

## 2020-05-23 DIAGNOSIS — G4701 Insomnia due to medical condition: Secondary | ICD-10-CM | POA: Insufficient documentation

## 2020-05-23 DIAGNOSIS — R5381 Other malaise: Secondary | ICD-10-CM

## 2020-05-23 DIAGNOSIS — E1142 Type 2 diabetes mellitus with diabetic polyneuropathy: Secondary | ICD-10-CM | POA: Diagnosis present

## 2020-05-23 MED ORDER — AMITRIPTYLINE HCL 25 MG PO TABS: 25.0000 mg | ORAL_TABLET | Freq: Every day | ORAL | 1 refills | Status: DC

## 2020-05-23 NOTE — Patient Instructions (Signed)
[place]

## 2020-05-23 NOTE — Progress Notes (Signed)
Subjective:    Patient ID: Max Pittman., male    DOB: 06-29-1975, 45 y.o.   MRN: [id number]  HPI  Max Pittman is a 45 year old man who presents for hospital f/u of the following medical issues:  1.Functional and mobility deficitssecondary to debility after cardiac arrest, volume overload multiple medical.            -has been receiving home therapy, would like to continue this   -as per his girlfriend at bedside, it would be hard for him to get to outpatient therapy appointments with his dialysis schedule  2. 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 He has been taking the oxycodone every 6 hours at night. He   -Cymbalta caused bad nightmares.   -currently taking the oxycodone 4x per day    Pain Inventory Average Pain 6 Pain Right Now 7 My pain is sharp, stabbing, tingling and aching  In the last 24 hours, has pain interfered with the following? General activity 9 Relation with others 9 Enjoyment of life 3 What TIME of day is your pain at its worst? varies Sleep (in general) Fair  Pain is worse with: walking, bending, sitting and standing Pain improves with: rest and medication Relief from Meds: 7  ability to climb steps?  no do you drive?  no use a wheelchair needs help with transfers  disabled: date disabled . I need assistance with the following:  dressing, bathing, meal prep and shopping  weakness numbness tingling trouble walking spasms confusion anxiety  Any changes since last visit?  no   Dialysis M-W-F Any changes since last visit?  no    Family History  Problem Relation Age of Onset  . Heart disease Mother   . Diabetes Mother   . Hypertension Mother   . Diabetes  Father   . Hypertension Father   . CAD Mother        "angina"  . Hypercholesterolemia Mother   . Hypercholesterolemia Father   . Healthy Brother    Social History   Socioeconomic History  . Marital status: Single    Spouse name: Not on file  . Number of children: Not on file  . Years of education: Not on file  . Highest education level: Not on file  Occupational History  . Occupation: Unemployed  Tobacco Use  . Smoking status: Former Smoker    Quit date: 11/16/2018    Years since quitting: 1.5  . Smokeless tobacco: Never Used  Vaping Use  . Vaping Use: Never used  Substance and Sexual Activity  . Alcohol use: Yes    Comment: occ  . Drug use: Never  . Sexual activity: Not Currently  Other Topics Concern  . Not on file  Social History Narrative   ** Merged History Encounter **       Social Determinants of Health   Financial Resource Strain: Not on file  Food Insecurity: Not on file  Transportation Needs: Not on file  Physical Activity: Not on file  Stress: Not on file  Social Connections: Not on file   Past Surgical History:  Procedure Laterality Date  . AMPUTATION Right 07/21/2019   Procedure: Right index finger amputation as necessary  at distal interphalangeal joint;  Surgeon: Roseanne Kaufman, MD;  Location: [place];  Service: Orthopedics;  Laterality: Right;  60 mins  . APPLICATION OF WOUND VAC Left 05/22/2019   Procedure: APPLICATION OF WOUND VAC  LEFT HIP;  Surgeon: Altamese [place] Park, MD;  Location: [place];  Service: Orthopedics;  Laterality: Left;  . APPLICATION OF WOUND VAC Left 07/17/2019   Procedure: WOUND VAC CHANGE;  Surgeon: Altamese Roscoe, MD;  Location: [place];  Service: Orthopedics;  Laterality: Left;  . AV FISTULA INSERTION W/ RF MAGNETIC GUIDANCE Left   . AV FISTULA PLACEMENT    . AV FISTULA PLACEMENT Left 04/08/2020   Procedure: ARTERIOVENOUS (AV) FISTULA CREATION;  Surgeon: Angelia Mould, MD;  Location: [place];  Service: Vascular;  Laterality:  Left;  . BUBBLE STUDY  05/25/2019   Procedure: BUBBLE STUDY;  Surgeon: Dorothy Spark, MD;  Location: [place];  Service: Cardiovascular;;  . CHOLECYSTECTOMY    . HERNIA REPAIR    . HIP CLOSED REDUCTION Left 05/20/2019   Procedure: CLOSED REDUCTION HIP WITH TRACTION PIN APPLICATION;  Surgeon: Altamese [place], MD;  Location: [place];  Service: Orthopedics;  Laterality: Left;  . INCISION AND DRAINAGE HIP Left 07/14/2019   Procedure: IRRIGATION AND DEBRIDEMENT HIP;  Surgeon: Altamese [place], MD;  Location: [place];  Service: Orthopedics;  Laterality: Left;  . INCISION AND DRAINAGE HIP Left 07/17/2019   Procedure: REPEAT IRRIGATION AND DEBRIDEMENT LEFT HIP;  Surgeon: Altamese Ka[place]ani, MD;  Location: [place];  Service: Orthopedics;  Laterality: Left;  . IR FLUORO GUIDE CV LINE RIGHT  05/23/2019  . IR FLUORO GUIDE CV LINE RIGHT  07/21/2019  . IR PERC TUN PERIT CATH WO PORT S&I [place]  03/20/2020  . IR REMOVAL TUN CV CATH W/O FL  08/29/2019  . IR US GUIDE VASC ACCESS RIGHT  05/23/2019  . IR US GUIDE VASC ACCESS RIGHT  07/21/2019  . IR US GUIDE VASC ACCESS RIGHT  03/20/2020  . LEFT HEART CATH AND CORONARY ANGIOGRAPHY N/A 03/22/2019   Procedure: LEFT HEART CATH AND CORONARY ANGIOGRAPHY;  Surgeon: Wellington Hampshire, MD;  Location: [place] CV LAB;  Service: Cardiovascular;  Laterality: N/A;  . LOOP RECORDER INSERTION N/A 05/25/2019   Procedure: LOOP RECORDER INSERTION;  Surgeon: [name], MD;  Location: [place] CV LAB;  Service: Cardiovascular;  Laterality: N/A;  . MINOR REMOVAL OF PERITONEAL DIALYSIS CATHETER N/A 03/22/2020   Procedure: REMOVAL OF PERITONEAL DIALYSIS CATHETER;  Surgeon: Kieth Brightly, Arta Bruce, MD;  Location: [place];  Service: General;  Laterality: N/A;  ROOM 1 STATING AT 11:00AM FOR 60 MIN  . ORIF ACETABULAR FRACTURE Left 05/22/2019   Procedure: OPEN REDUCTION INTERNAL FIXATION (ORIF) T TYPE  WITH ASSOCIATED POSTERIOR WALL ACETABULAR FRACTURE, LEFT; REMOVAL OF TRACTION PIN LEFT TIBIA;   Surgeon: Altamese Blum, MD;  Location: [place];  Service: Orthopedics;  Laterality: Left;  . REMOVAL OF A DIALYSIS CATHETER Right 07/17/2019   Procedure: REMOVAL OF HEMODIALYSIS DIALYSIS CATHETER;  Surgeon: Altamese Onamia, MD;  Location: [place];  Service: Orthopedics;  Laterality: Right;  . TEE WITHOUT CARDIOVERSION N/A 05/25/2019   Procedure: TRANSESOPHAGEAL ECHOCARDIOGRAM (TEE);  Surgeon: Dorothy Spark, MD;  Location: [facility] ENDOSCOPY;  Service: Cardiovascular;  Laterality: N/A;  . TEE WITHOUT CARDIOVERSION N/A 07/19/2019   Procedure: TRANSESOPHAGEAL ECHOCARDIOGRAM (TEE);  Surgeon: Sueanne Margarita, MD;  Location: [facility] ENDOSCOPY;  Service: Cardiovascular;  Laterality: N/A;  . TONSILLECTOMY     Past Medical History:  Diagnosis Date  . Arthritis   .  Closed dislocation of left hip ([place]) 05/21/2019  . Diabetes ([place])   . Diabetes mellitus without complication ([place])   . DVT (deep venous thrombosis) ([place])   . History of peritoneal dialysis   . Hypertension   . Renal disorder    FFGS  . Renal disorder   . Sleep apnea   . Stroke [facility])    when ha was a child  . Vasovagal syndrome    with syncope   BP 122/79   Pulse 84   Temp 98.6 F (37 C)   Ht '5\' 3"'$  (1.6 m)   Wt 281 lb 9.6 oz (127.7 kg) Comment: w/c  weight 13.9 lbs was subtracted from orig wt 295.6.  BMI 49.88 kg/m   Opioid Risk Score:   Fall Risk Score:  `1  Depression screen PHQ 2/9  Depression screen [facility] 2/9 05/23/2020 12/18/2019 10/16/2019 08/29/2019 06/29/2019  Decreased Interest 1 0 0 0 2  Down, Depressed, Hopeless 0 0 0 0 0  PHQ - 2 Score 1 0 0 0 2  Altered sleeping 3 - - - 2  Tired, decreased energy 2 - - - 2  Change in appetite 3 - - - 2  Feeling bad or failure about yourself  0 - - - 2  Trouble concentrating 3 - - - 3  Moving slowly or fidgety/restless 2 - - - 0  Suicidal thoughts 0 - - - 0  PHQ-9 Score 14 - - - 13    Review of Systems     Objective:   Physical Exam Gen: no distress, normal appearing HEENT: oral  mucosa pink and moist, NCAT Cardio: Reg rate Chest: normal effort, normal rate of breathing Abd: soft, non-distended Ext: no edema Psych: pleasant, normal affect Skin: no new bleeding from access site.  Musc: Right lower extremity edema Bilateral lower extremity tenderness, mild Neuro: Alert Motor: Bilateral upper extremities: 4+/5 proximal distal RLE limited by pain 3/5 prox to 4/5 distally. LLE 3/5 prox to 4/5 distally.      Assessment & Plan:  1.Functional and mobility deficitssecondary to debility after cardiac arrest, volume overload multiple medical.            -continue home therapy, I will review notes and correspond with home therapists.   -discussed transition to outpatient therapy, but as per his girlfriend at bedside, it would be hard for him to get to outpatient therapy appointments with his dialysis schedule  2. 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 He has been taking the oxycodone every 6 hours as needed. Discussed timed voiding and timing oxycodone 30 minutes prior to using bathroom to help with pain  -Cymbalta caused bad nightmares.   3. Insomnia: increase amitriptyline to '25mg'$   4. Diabetic peripheral neuropathy: -Discussed Qutenza as an option for neuropathic pain control. Discussed that this is a capsaicin patch, stronger than capsaicin cream. Discussed that it is currently approved for diabetic peripheral neuropathy and post-herpetic neuralgia, but that it has also shown benefit in treating other forms of neuropathy. Provided patient with link to site to learn more about the patch: [employer]fr. Discussed that the patch would be placed in office and benefits usually last 3  months. Discussed that unintended exposure to capsaicin  can cause severe irritation of eyes, mucous membranes, respiratory tract, and skin, but that Qutenza is a local treatment and does not have the systemic side effects of other nerve medications. Discussed that there may be pain, itching, erythema, and decreased sensory function associated with the application of Qutenza. Side effects usually subside within 1 week. A cold pack of analgesic medications can help with these side effects. Blood pressure can also be increased due to pain associated with administration of the patch.

## 2020-05-24 MED ORDER — ISOSORBIDE MONONITRATE ER 30 MG PO TB24: 15.0000 mg | ORAL_TABLET | Freq: Every day | ORAL | 0 refills | Status: AC

## 2020-05-24 MED ORDER — AMITRIPTYLINE HCL 75 MG PO TABS: 75.0000 mg | ORAL_TABLET | Freq: Every day | ORAL | 1 refills | Status: DC

## 2020-05-24 NOTE — Telephone Encounter (Signed)
Will send

## 2020-05-24 NOTE — Addendum Note (Signed)
Addended by: Izora Ribas on: 05/24/2020 10:09 AM   Modules accepted: Orders

## 2020-05-24 NOTE — Telephone Encounter (Signed)
Refill request for Imdur. Patient seen yesterday

## 2020-06-04 ENCOUNTER — Other Ambulatory Visit: Payer: Self-pay | Admitting: Physical Medicine and Rehabilitation

## 2020-06-04 NOTE — Progress Notes (Signed)
[facility] Summary Report / Loop Recorder 

## 2020-06-11 ENCOUNTER — Ambulatory Visit: Payer: Medicare Other | Attending: Critical Care Medicine

## 2020-06-11 DIAGNOSIS — Z23 Encounter for immunization: Secondary | ICD-10-CM

## 2020-06-11 NOTE — Progress Notes (Signed)
   [id number]ination [facility]  Name:  Max Pittman.    MRN: [id number] DOB: [birth year]  06/11/2020  [name] was observed post Covid-19 immunization for 15 minutes without incident. He was provided with Vaccine Information Sheet and instruction to access the V-Safe system.   [name] was instructed to call 911 with any severe reactions post vaccine: Marland Kitchen Difficulty breathing  . Swelling of face and throat  . A fast heartbeat  . A bad rash all over body  . Dizziness and weakness   Immunizations Administered    Name Date Dose VIS Date Route   PFIZER Comrnaty(Gray TOP) Covid-19 Vaccine 06/11/2020 10:00 AM 0.3 mL 01/04/2020 Intramuscular   Manufacturer: [employer], [employer]   Lot: [id number]   NDC: [phone]

## 2020-06-13 ENCOUNTER — Encounter (HOSPITAL_COMMUNITY): Payer: Medicare Other

## 2020-06-16 ENCOUNTER — Other Ambulatory Visit: Payer: Self-pay | Admitting: Physical Medicine and Rehabilitation

## 2020-06-17 ENCOUNTER — Ambulatory Visit (INDEPENDENT_AMBULATORY_CARE_PROVIDER_SITE_OTHER): Payer: Medicare Other

## 2020-06-17 DIAGNOSIS — I469 Cardiac arrest, cause unspecified: Secondary | ICD-10-CM | POA: Diagnosis not present

## 2020-06-18 LAB — CUP PACEART REMOTE DEVICE CHECK
Date Time Interrogation Session: 20220522201817
Implantable Pulse Generator Implant Date: 20210429

## 2020-06-19 DIAGNOSIS — Z9889 Other specified postprocedural states: Secondary | ICD-10-CM | POA: Insufficient documentation

## 2020-06-20 ENCOUNTER — Other Ambulatory Visit: Payer: Self-pay | Admitting: Physical Medicine and Rehabilitation

## 2020-06-21 DIAGNOSIS — S7402XA Injury of sciatic nerve at hip and thigh level, left leg, initial encounter: Secondary | ICD-10-CM | POA: Insufficient documentation

## 2020-06-21 DIAGNOSIS — M21372 Foot drop, left foot: Secondary | ICD-10-CM | POA: Insufficient documentation

## 2020-06-23 ENCOUNTER — Other Ambulatory Visit: Payer: Self-pay | Admitting: Physical Medicine and Rehabilitation

## 2020-06-25 ENCOUNTER — Encounter (HOSPITAL_COMMUNITY): Payer: Medicare Other

## 2020-06-27 ENCOUNTER — Other Ambulatory Visit: Payer: Self-pay | Admitting: Physical Medicine and Rehabilitation

## 2020-06-27 MED ORDER — GABAPENTIN 300 MG PO CAPS: 300.0000 mg | ORAL_CAPSULE | Freq: Three times a day (TID) | ORAL | 2 refills | Status: DC

## 2020-07-03 ENCOUNTER — Other Ambulatory Visit: Payer: Self-pay | Admitting: Physical Medicine and Rehabilitation

## 2020-07-03 ENCOUNTER — Encounter: Payer: Self-pay | Admitting: Vascular Surgery

## 2020-07-04 ENCOUNTER — Other Ambulatory Visit: Payer: Self-pay

## 2020-07-04 DIAGNOSIS — N186 End stage renal disease: Secondary | ICD-10-CM

## 2020-07-05 ENCOUNTER — Other Ambulatory Visit: Payer: Self-pay | Admitting: Physical Medicine and Rehabilitation

## 2020-07-09 NOTE — Progress Notes (Signed)
[facility] Summary Report / Loop Recorder 

## 2020-07-18 ENCOUNTER — Ambulatory Visit (INDEPENDENT_AMBULATORY_CARE_PROVIDER_SITE_OTHER): Payer: Medicare Other

## 2020-07-18 DIAGNOSIS — I639 Cerebral infarction, unspecified: Secondary | ICD-10-CM

## 2020-07-21 ENCOUNTER — Other Ambulatory Visit: Payer: Self-pay | Admitting: Physical Medicine and Rehabilitation

## 2020-07-22 LAB — CUP PACEART REMOTE DEVICE CHECK
Date Time Interrogation Session: 20220624202014
Implantable Pulse Generator Implant Date: 20210429

## 2020-07-23 ENCOUNTER — Other Ambulatory Visit: Payer: Self-pay

## 2020-07-23 ENCOUNTER — Ambulatory Visit (HOSPITAL_COMMUNITY)
Admission: RE | Admit: 2020-07-23 | Discharge: 2020-07-23 | Disposition: A | Discharge status: Home / Self Care | Payer: Medicare Other | Source: Ambulatory Visit | Attending: Vascular Surgery | Admitting: Vascular Surgery

## 2020-07-23 ENCOUNTER — Ambulatory Visit (INDEPENDENT_AMBULATORY_CARE_PROVIDER_SITE_OTHER): Payer: Medicare Other | Admitting: Physician Assistant

## 2020-07-23 ENCOUNTER — Other Ambulatory Visit: Payer: Self-pay | Admitting: Physical Medicine and Rehabilitation

## 2020-07-23 VITALS — BP 101/66 | HR 66 | Temp 98.0°F | Resp 20 | Ht 63.0 in | Wt 284.4 lb

## 2020-07-23 DIAGNOSIS — N186 End stage renal disease: Secondary | ICD-10-CM | POA: Insufficient documentation

## 2020-07-23 DIAGNOSIS — I639 Cerebral infarction, unspecified: Secondary | ICD-10-CM | POA: Diagnosis not present

## 2020-07-23 NOTE — Progress Notes (Signed)
HISTORY AND PHYSICAL     CC:  dialysis access Requesting Provider:  Chesley Noon, MD  HPI: This is a 45 y.o. male here for evaluation of his hemodialysis access.  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 Scot Dock.  He has hx of left BC AVF.  When he was seen in the hospital on 04/05/2020, he had presented to [facility] ED on 03/07/20 with with cardiac arrest due to "massive volume overload". He required 12 minutes of CPR with ROSC, was intubated in ED and treated with cooling protocol. Since his admission, his PD cath has been removed and a right IJ tunnelled TDC has been placed on 03/20/2020 by interventional radiology.  He does have some mild numbness in his left hand but is not painful.  He denies any shortness of breath or chest pain.  He is not walking as he is unable to bear weight due to hip replacement.   He states he had been on coumadin for DVT that he got in April 2021 due to an accident.  He was taken off this in April 2022 bc he had a spontaneous hematoma in the right thigh.  He states this is improving.    The pt is right hand dominant.    Pt is on dialysis.   Days of dialysis if applicable:  M/W/F    HD center if applicable:  [name] at [facility] location.   The pt is on a statin for cholesterol management.  The pt is not on a daily aspirin.  Other AC:  none The pt is on BB for hypertension.  The pt is diabetic.   Tobacco hx:  former  Past Medical History:  Diagnosis Date   Arthritis    Closed dislocation of left hip ([place]) 05/21/2019   Diabetes ([place])    Diabetes mellitus without complication ([place])    DVT (deep venous thrombosis) ([place])    History of peritoneal dialysis    Hypertension    Renal disorder    FFGS   Renal disorder    Sleep apnea    Stroke [facility])    when ha was a child   Vasovagal syndrome    with syncope    Past Surgical History:  Procedure Laterality Date   AMPUTATION Right 07/21/2019   Procedure: Right index finger amputation as  necessary at distal interphalangeal joint;  Surgeon: Roseanne Kaufman, MD;  Location: [place];  Service: Orthopedics;  Laterality: Right;  60 mins   APPLICATION OF WOUND VAC Left 05/22/2019   Procedure: APPLICATION OF WOUND VAC  LEFT HIP;  Surgeon: Altamese [place], MD;  Location: [place];  Service: Orthopedics;  Laterality: Left;   APPLICATION OF WOUND VAC Left 07/17/2019   Procedure: WOUND VAC CHANGE;  Surgeon: Altamese [place], MD;  Location: [place];  Service: Orthopedics;  Laterality: Left;   AV FISTULA INSERTION W/ RF MAGNETIC GUIDANCE Left    AV FISTULA PLACEMENT     AV FISTULA PLACEMENT Left 04/08/2020   Procedure: ARTERIOVENOUS (AV) FISTULA CREATION;  Surgeon: Angelia Mould, MD;  Location: [place];  Service: Vascular;  Laterality: Left;   BUBBLE STUDY  05/25/2019   Procedure: BUBBLE STUDY;  Surgeon: Dorothy Spark, MD;  Location: [place];  Service: Cardiovascular;;   CHOLECYSTECTOMY     HERNIA REPAIR     HIP CLOSED REDUCTION Left 05/20/2019   Procedure: CLOSED REDUCTION HIP WITH TRACTION PIN APPLICATION;  Surgeon: Altamese Chauncey, MD;  Location: [place];  Service:  Orthopedics;  Laterality: Left;   INCISION AND DRAINAGE HIP Left 07/14/2019   Procedure: IRRIGATION AND DEBRIDEMENT HIP;  Surgeon: Altamese [place], MD;  Location: [place];  Service: Orthopedics;  Laterality: Left;   INCISION AND DRAINAGE HIP Left 07/17/2019   Procedure: REPEAT IRRIGATION AND DEBRIDEMENT LEFT HIP;  Surgeon: Altamese Holloway, MD;  Location: [place];  Service: Orthopedics;  Laterality: Left;   IR FLUORO GUIDE CV LINE RIGHT  05/23/2019   IR FLUORO GUIDE CV LINE RIGHT  07/21/2019   IR PERC TUN PERIT CATH WO PORT S&I /IMAG  03/20/2020   IR REMOVAL TUN CV CATH W/O FL  08/29/2019   IR US GUIDE VASC ACCESS RIGHT  05/23/2019   IR US GUIDE VASC ACCESS RIGHT  07/21/2019   IR US GUIDE VASC ACCESS RIGHT  03/20/2020   LEFT HEART CATH AND CORONARY ANGIOGRAPHY N/A 03/22/2019   Procedure: LEFT HEART CATH AND CORONARY ANGIOGRAPHY;  Surgeon:  Wellington Hampshire, MD;  Location: [place] CV LAB;  Service: Cardiovascular;  Laterality: N/A;   LOOP RECORDER INSERTION N/A 05/25/2019   Procedure: LOOP RECORDER INSERTION;  Surgeon: [name], MD;  Location: [place] CV LAB;  Service: Cardiovascular;  Laterality: N/A;   MINOR REMOVAL OF PERITONEAL DIALYSIS CATHETER N/A 03/22/2020   Procedure: REMOVAL OF PERITONEAL DIALYSIS CATHETER;  Surgeon: Kieth Brightly, Arta Bruce, MD;  Location: [place];  Service: General;  Laterality: N/A;  ROOM 1 STATING AT 11:00AM FOR 60 MIN   ORIF ACETABULAR FRACTURE Left 05/22/2019   Procedure: OPEN REDUCTION INTERNAL FIXATION (ORIF) T TYPE  WITH ASSOCIATED POSTERIOR WALL ACETABULAR FRACTURE, LEFT; REMOVAL OF TRACTION PIN LEFT TIBIA;  Surgeon: Altamese Diggins, MD;  Location: [place];  Service: Orthopedics;  Laterality: Left;   REMOVAL OF A DIALYSIS CATHETER Right 07/17/2019   Procedure: REMOVAL OF HEMODIALYSIS DIALYSIS CATHETER;  Surgeon: Altamese [place], MD;  Location: [place];  Service: Orthopedics;  Laterality: Right;   TEE WITHOUT CARDIOVERSION N/A 05/25/2019   Procedure: TRANSESOPHAGEAL ECHOCARDIOGRAM (TEE);  Surgeon: Dorothy Spark, MD;  Location: [facility] ENDOSCOPY;  Service: Cardiovascular;  Laterality: N/A;   TEE WITHOUT CARDIOVERSION N/A 07/19/2019   Procedure: TRANSESOPHAGEAL ECHOCARDIOGRAM (TEE);  Surgeon: Sueanne Margarita, MD;  Location: [facility] ENDOSCOPY;  Service: Cardiovascular;  Laterality: N/A;   TONSILLECTOMY      Allergies  Allergen Reactions   Doxycycline Hives   Latex Swelling    Pt reports swelling at site.    Morphine Anxiety    Current Outpatient Medications  Medication Sig Dispense Refill   amitriptyline (ELAVIL) 75 MG tablet Take 1 tablet (75 mg total) by mouth at bedtime. 30 tablet 1   atorvastatin (LIPITOR) 40 MG tablet TAKE 1 TABLET BY MOUTH DAILY AT 6 PM. 31 tablet 0   baclofen (LIORESAL) 10 MG tablet baclofen 10 mg tablet  TAKE ONE TABLET (10 MG DOSE) BY MOUTH 3 (THREE) TIMES A DAY FOR 30  DAYS.     buPROPion (WELLBUTRIN SR) 150 MG 12 hr tablet Take 1 tablet (150 mg total) by mouth 2 (two) times daily with a meal. 60 tablet 0   cephALEXin (KEFLEX) 250 MG capsule Take 1 capsule (250 mg total) by mouth every 12 (twelve) hours. 60 capsule 0   Cholecalciferol 25 MCG (1000 UT) tablet cholecalciferol (vitamin D3) 25 mcg (1,000 unit) tablet   2000 units by oral route.     [employer] ANTI-ITCH lotion APPLY TOPICALLY AS NEEDED FOR ITCHING. 222 mL 0   docusate sodium (COLACE) 100 MG capsule TAKE 1  CAPSULE BY MOUTH TWICE A DAY 60 capsule 0   FLUoxetine (PROZAC) 10 MG tablet Take 1 tablet (10 mg total) by mouth daily. 30 tablet 0   gabapentin (NEURONTIN) 300 MG capsule Take 1 capsule (300 mg total) by mouth 3 (three) times daily. 90 capsule 2   hydrALAZINE (APRESOLINE) 25 MG tablet Take 1 tablet by mouth 3 (three) times daily.     isosorbide mononitrate (IMDUR) 30 MG 24 hr tablet Take 0.5 tablets (15 mg total) by mouth daily. 15 tablet 0   lamoTRIgine (LAMICTAL) 100 MG tablet TAKE 1 TABLET BY MOUTH TWICE A DAY 60 tablet 11   levETIRAcetam (KEPPRA) 500 MG tablet Take 1 tablet (500 mg total) by mouth daily. (Patient taking differently: Take 500 mg by mouth at bedtime.) 90 tablet 3   loratadine (CLARITIN) 10 MG tablet Take 1 tablet (10 mg total) by mouth daily. 30 tablet 0   meclizine (ANTIVERT) 25 MG tablet TAKE 1 TABLET BY MOUTH THREE TIMES A DAY 90 tablet 0   melatonin 5 MG TABS Take 1 tablet (5 mg total) by mouth at bedtime. 30 tablet 0   multivitamin (RENA-VIT) TABS tablet Take 1 tablet by mouth at bedtime. 30 tablet 0   oxyCODONE-acetaminophen (PERCOCET/ROXICET) 5-325 MG tablet Take 1-2 tablets by mouth every 6 (six) hours as needed for severe pain. 56 tablet 0   pantoprazole (PROTONIX) 40 MG tablet TAKE 1 TABLET BY MOUTH EVERY DAY 30 tablet 0   polyethylene glycol (MIRALAX / GLYCOLAX) 17 g packet Take 17 g by mouth daily. 30 each 0   ranolazine (RANEXA) 500 MG 12 hr tablet ranolazine ER 500 mg  tablet,extended release,12 hr  TAKE 1 TABLET BY MOUTH TWICE A DAY     sevelamer carbonate (RENVELA) 800 MG tablet Take 2 tablets (1,600 mg total) by mouth 3 (three) times daily with meals. 240 tablet 0   metoprolol tartrate (LOPRESSOR) 25 MG tablet Take 0.5 tablets (12.5 mg total) by mouth 2 (two) times daily. 30 tablet 0   No current facility-administered medications for this visit.    Family History  Problem Relation Age of Onset   Heart disease Mother    Diabetes Mother    Hypertension Mother    Diabetes Father    Hypertension Father    CAD Mother        "angina"   Hypercholesterolemia Mother    Hypercholesterolemia Father    Healthy Brother     Social History   Socioeconomic History   Marital status: Single    Spouse name: Not on file   Number of children: Not on file   Years of education: Not on file   Highest education level: Not on file  Occupational History   Occupation: Unemployed  Tobacco Use   Smoking status: Former    Pack years: 0.00    Types: Cigarettes    Quit date: 11/16/2018    Years since quitting: 1.6   Smokeless tobacco: Never  Vaping Use   Vaping Use: Never used  Substance and Sexual Activity   Alcohol use: Yes    Comment: occ   Drug use: Never   Sexual activity: Not Currently  Other Topics Concern   Not on file  Social History Narrative   ** Merged History Encounter **       Social Determinants of Health   Financial Resource Strain: Not on file  Food Insecurity: Not on file  Transportation Needs: Not on file  Physical Activity: Not on file  Stress: Not on file  Social Connections: Not on file  Intimate Partner Violence: Not on file     ROS: '[x]'$  Positive   '[ ]'$  Negative   '[ ]'$  All sytems reviewed and are negative  Cardiac: '[]'$  chest pain/pressure '[]'$  SOB '[]'$  DOE  Vascular: '[]'$  pain in legs while walking '[]'$  pain in feet when lying flat '[x]'$  hx of DVT '[]'$  swelling in legs  Pulmonary: '[]'$  asthma '[]'$  wheezing  Neurologic: '[]'$   weakness in '[]'$  arms '[]'$  legs '[]'$  numbness in '[]'$  arms '[]'$  legs '[]'$ difficulty speaking or slurred speech  Hematologic: '[]'$  bleeding problems  GI '[]'$  GERD  GU: '[x]'$  CKD/renal failure  '[x]'$  HD---'[x]'$  M/W/F '[]'$  T/T/S  Psychiatric: '[]'$  hx of major depression  Integumentary: '[]'$  rashes '[]'$  ulcers  Constitutional: '[]'$  fever '[]'$  chills   PHYSICAL EXAMINATION:  Today's Vitals   07/23/20 1143  BP: 101/66  Pulse: 66  Resp: 20  Temp: 98 F (36.7 C)  TempSrc: Temporal  SpO2: 97%  Weight: 284 lb 6.3 oz (129 kg)  Height: '5\' 3"'$  (1.6 m)   Body mass index is 50.38 kg/m.    General:  WDWN male in NAD Gait: Not observed- in WC HENT: WNL Pulmonary: normal non-labored breathing Cardiac: regular Abdomen: obese Skin: without rashes Vascular Exam/Pulses:   Right Left  Radial 2+ (normal) 2+ (normal)  AT Brisk biphasic doppler Brisk biphasic doppler  PT Brisk biphasic doppler Brisk biphasic doppler   Extremities:  without ischemic changes, without Gangrene, without open wounds; his fistula has an excellent thrill; motor and sensation are in tact left hand.  Musculoskeletal: no muscle wasting or atrophy  Neurologic: A&O X 3; Speech is fluent/normal  Non-Invasive Vascular Imaging:   Upper Extremity Vein Mapping on 07/23/2020: Diameter:  0.70-1.33cm Depth:  0.88-3.84cm   ASSESSMENT/PLAN: 45 y.o. male with ESRD here for evaluation of his hemodialysis access with hx of left 1st stage BVT on 04/08/2020 by Dr. Scot Dock  -pt's fistula has matured nicely and is ready for transposition.  Discussed with pt that he will require two maybe three incisions to superficialize the vein.  He expressed understanding.  -discussed with pt that his access will eventually need intervention or new access in the future as they will not last forever.  He expressed understanding.  -pt had been on coumadin for DVT in April 2021 that he was taken off of in April 2022 due to spontaneous hematoma in the right thigh.  His  right thigh is soft.  Discussed with Dr. Stanford Breed and okay to schedule for 2nd stage BVT.  This is pt's first DVT and was due to accident, therefore 3 months of anticoagulation should be sufficient but will defer to PCP for this -pt is on dialysis -pt is right hand dominant - will plan for 2nd stage BVT on a Tuesday with Dr. Scot Dock -of note, pt has brisk biphasic doppler signals bilateral feet.  He does have neuropathy and discussed with him to continue to protect his feet and inspect them daily    Leontine Locket, [facility] [phone]  Clinic MD:   Stanford Breed

## 2020-07-23 NOTE — H&P (View-Only) (Signed)
HISTORY AND PHYSICAL     CC:  dialysis access Requesting Provider:  Chesley Noon, MD  HPI: This is a 45 y.o. male here for evaluation of his hemodialysis access.  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 Scot Dock.  He has hx of left BC AVF.  When he was seen in the hospital on 04/05/2020, he had presented to [facility] ED on 03/07/20 with with cardiac arrest due to "massive volume overload". He required 12 minutes of CPR with ROSC, was intubated in ED and treated with cooling protocol. Since his admission, his PD cath has been removed and a right IJ tunnelled TDC has been placed on 03/20/2020 by interventional radiology.  He does have some mild numbness in his left hand but is not painful.  He denies any shortness of breath or chest pain.  He is not walking as he is unable to bear weight due to hip replacement.   He states he had been on coumadin for DVT that he got in April 2021 due to an accident.  He was taken off this in April 2022 bc he had a spontaneous hematoma in the right thigh.  He states this is improving.    The pt is right hand dominant.    Pt is on dialysis.   Days of dialysis if applicable:  M/W/F    HD center if applicable:  [name] at [facility] location.   The pt is on a statin for cholesterol management.  The pt is not on a daily aspirin.  Other AC:  none The pt is on BB for hypertension.  The pt is diabetic.   Tobacco hx:  former  Past Medical History:  Diagnosis Date   Arthritis    Closed dislocation of left hip ([place]) 05/21/2019   Diabetes ([place])    Diabetes mellitus without complication ([place])    DVT (deep venous thrombosis) ([place])    History of peritoneal dialysis    Hypertension    Renal disorder    FFGS   Renal disorder    Sleep apnea    Stroke [facility])    when ha was a child   Vasovagal syndrome    with syncope    Past Surgical History:  Procedure Laterality Date   AMPUTATION Right 07/21/2019   Procedure: Right index finger amputation as  necessary at distal interphalangeal joint;  Surgeon: Roseanne Kaufman, MD;  Location: [place];  Service: Orthopedics;  Laterality: Right;  60 mins   APPLICATION OF WOUND VAC Left 05/22/2019   Procedure: APPLICATION OF WOUND VAC  LEFT HIP;  Surgeon: Altamese Rebecca, MD;  Location: [place];  Service: Orthopedics;  Laterality: Left;   APPLICATION OF WOUND VAC Left 07/17/2019   Procedure: WOUND VAC CHANGE;  Surgeon: Altamese Huntsville, MD;  Location: [place];  Service: Orthopedics;  Laterality: Left;   AV FISTULA INSERTION W/ RF MAGNETIC GUIDANCE Left    AV FISTULA PLACEMENT     AV FISTULA PLACEMENT Left 04/08/2020   Procedure: ARTERIOVENOUS (AV) FISTULA CREATION;  Surgeon: Angelia Mould, MD;  Location: [place];  Service: Vascular;  Laterality: Left;   BUBBLE STUDY  05/25/2019   Procedure: BUBBLE STUDY;  Surgeon: Dorothy Spark, MD;  Location: [place];  Service: Cardiovascular;;   CHOLECYSTECTOMY     HERNIA REPAIR     HIP CLOSED REDUCTION Left 05/20/2019   Procedure: CLOSED REDUCTION HIP WITH TRACTION PIN APPLICATION;  Surgeon: Altamese Haliimaile, MD;  Location: [place];  Service:  Orthopedics;  Laterality: Left;   INCISION AND DRAINAGE HIP Left 07/14/2019   Procedure: IRRIGATION AND DEBRIDEMENT HIP;  Surgeon: Altamese Oneida, MD;  Location: [place];  Service: Orthopedics;  Laterality: Left;   INCISION AND DRAINAGE HIP Left 07/17/2019   Procedure: REPEAT IRRIGATION AND DEBRIDEMENT LEFT HIP;  Surgeon: Altamese Aurora, MD;  Location: [place];  Service: Orthopedics;  Laterality: Left;   IR FLUORO GUIDE CV LINE RIGHT  05/23/2019   IR FLUORO GUIDE CV LINE RIGHT  07/21/2019   IR PERC TUN PERIT CATH WO PORT S&I /IMAG  03/20/2020   IR REMOVAL TUN CV CATH W/O FL  08/29/2019   IR US GUIDE VASC ACCESS RIGHT  05/23/2019   IR US GUIDE VASC ACCESS RIGHT  07/21/2019   IR US GUIDE VASC ACCESS RIGHT  03/20/2020   LEFT HEART CATH AND CORONARY ANGIOGRAPHY N/A 03/22/2019   Procedure: LEFT HEART CATH AND CORONARY ANGIOGRAPHY;  Surgeon:  Wellington Hampshire, MD;  Location: [place] CV LAB;  Service: Cardiovascular;  Laterality: N/A;   LOOP RECORDER INSERTION N/A 05/25/2019   Procedure: LOOP RECORDER INSERTION;  Surgeon: [name], MD;  Location: [place] CV LAB;  Service: Cardiovascular;  Laterality: N/A;   MINOR REMOVAL OF PERITONEAL DIALYSIS CATHETER N/A 03/22/2020   Procedure: REMOVAL OF PERITONEAL DIALYSIS CATHETER;  Surgeon: Kieth Brightly, Arta Bruce, MD;  Location: [place];  Service: General;  Laterality: N/A;  ROOM 1 STATING AT 11:00AM FOR 60 MIN   ORIF ACETABULAR FRACTURE Left 05/22/2019   Procedure: OPEN REDUCTION INTERNAL FIXATION (ORIF) T TYPE  WITH ASSOCIATED POSTERIOR WALL ACETABULAR FRACTURE, LEFT; REMOVAL OF TRACTION PIN LEFT TIBIA;  Surgeon: Altamese Woodruff, MD;  Location: [place];  Service: Orthopedics;  Laterality: Left;   REMOVAL OF A DIALYSIS CATHETER Right 07/17/2019   Procedure: REMOVAL OF HEMODIALYSIS DIALYSIS CATHETER;  Surgeon: Altamese Kimball, MD;  Location: [place];  Service: Orthopedics;  Laterality: Right;   TEE WITHOUT CARDIOVERSION N/A 05/25/2019   Procedure: TRANSESOPHAGEAL ECHOCARDIOGRAM (TEE);  Surgeon: Dorothy Spark, MD;  Location: [facility] ENDOSCOPY;  Service: Cardiovascular;  Laterality: N/A;   TEE WITHOUT CARDIOVERSION N/A 07/19/2019   Procedure: TRANSESOPHAGEAL ECHOCARDIOGRAM (TEE);  Surgeon: Sueanne Margarita, MD;  Location: [facility] ENDOSCOPY;  Service: Cardiovascular;  Laterality: N/A;   TONSILLECTOMY      Allergies  Allergen Reactions   Doxycycline Hives   Latex Swelling    Pt reports swelling at site.    Morphine Anxiety    Current Outpatient Medications  Medication Sig Dispense Refill   amitriptyline (ELAVIL) 75 MG tablet Take 1 tablet (75 mg total) by mouth at bedtime. 30 tablet 1   atorvastatin (LIPITOR) 40 MG tablet TAKE 1 TABLET BY MOUTH DAILY AT 6 PM. 31 tablet 0   baclofen (LIORESAL) 10 MG tablet baclofen 10 mg tablet  TAKE ONE TABLET (10 MG DOSE) BY MOUTH 3 (THREE) TIMES A DAY FOR 30  DAYS.     buPROPion (WELLBUTRIN SR) 150 MG 12 hr tablet Take 1 tablet (150 mg total) by mouth 2 (two) times daily with a meal. 60 tablet 0   cephALEXin (KEFLEX) 250 MG capsule Take 1 capsule (250 mg total) by mouth every 12 (twelve) hours. 60 capsule 0   Cholecalciferol 25 MCG (1000 UT) tablet cholecalciferol (vitamin D3) 25 mcg (1,000 unit) tablet   2000 units by oral route.     [employer] ANTI-ITCH lotion APPLY TOPICALLY AS NEEDED FOR ITCHING. 222 mL 0   docusate sodium (COLACE) 100 MG capsule TAKE 1  CAPSULE BY MOUTH TWICE A DAY 60 capsule 0   FLUoxetine (PROZAC) 10 MG tablet Take 1 tablet (10 mg total) by mouth daily. 30 tablet 0   gabapentin (NEURONTIN) 300 MG capsule Take 1 capsule (300 mg total) by mouth 3 (three) times daily. 90 capsule 2   hydrALAZINE (APRESOLINE) 25 MG tablet Take 1 tablet by mouth 3 (three) times daily.     isosorbide mononitrate (IMDUR) 30 MG 24 hr tablet Take 0.5 tablets (15 mg total) by mouth daily. 15 tablet 0   lamoTRIgine (LAMICTAL) 100 MG tablet TAKE 1 TABLET BY MOUTH TWICE A DAY 60 tablet 11   levETIRAcetam (KEPPRA) 500 MG tablet Take 1 tablet (500 mg total) by mouth daily. (Patient taking differently: Take 500 mg by mouth at bedtime.) 90 tablet 3   loratadine (CLARITIN) 10 MG tablet Take 1 tablet (10 mg total) by mouth daily. 30 tablet 0   meclizine (ANTIVERT) 25 MG tablet TAKE 1 TABLET BY MOUTH THREE TIMES A DAY 90 tablet 0   melatonin 5 MG TABS Take 1 tablet (5 mg total) by mouth at bedtime. 30 tablet 0   multivitamin (RENA-VIT) TABS tablet Take 1 tablet by mouth at bedtime. 30 tablet 0   oxyCODONE-acetaminophen (PERCOCET/ROXICET) 5-325 MG tablet Take 1-2 tablets by mouth every 6 (six) hours as needed for severe pain. 56 tablet 0   pantoprazole (PROTONIX) 40 MG tablet TAKE 1 TABLET BY MOUTH EVERY DAY 30 tablet 0   polyethylene glycol (MIRALAX / GLYCOLAX) 17 g packet Take 17 g by mouth daily. 30 each 0   ranolazine (RANEXA) 500 MG 12 hr tablet ranolazine ER 500 mg  tablet,extended release,12 hr  TAKE 1 TABLET BY MOUTH TWICE A DAY     sevelamer carbonate (RENVELA) 800 MG tablet Take 2 tablets (1,600 mg total) by mouth 3 (three) times daily with meals. 240 tablet 0   metoprolol tartrate (LOPRESSOR) 25 MG tablet Take 0.5 tablets (12.5 mg total) by mouth 2 (two) times daily. 30 tablet 0   No current facility-administered medications for this visit.    Family History  Problem Relation Age of Onset   Heart disease Mother    Diabetes Mother    Hypertension Mother    Diabetes Father    Hypertension Father    CAD Mother        "angina"   Hypercholesterolemia Mother    Hypercholesterolemia Father    Healthy Brother     Social History   Socioeconomic History   Marital status: Single    Spouse name: Not on file   Number of children: Not on file   Years of education: Not on file   Highest education level: Not on file  Occupational History   Occupation: Unemployed  Tobacco Use   Smoking status: Former    Pack years: 0.00    Types: Cigarettes    Quit date: 11/16/2018    Years since quitting: 1.6   Smokeless tobacco: Never  Vaping Use   Vaping Use: Never used  Substance and Sexual Activity   Alcohol use: Yes    Comment: occ   Drug use: Never   Sexual activity: Not Currently  Other Topics Concern   Not on file  Social History Narrative   ** Merged History Encounter **       Social Determinants of Health   Financial Resource Strain: Not on file  Food Insecurity: Not on file  Transportation Needs: Not on file  Physical Activity: Not on file  Stress: Not on file  Social Connections: Not on file  Intimate Partner Violence: Not on file     ROS: '[x]'$  Positive   '[ ]'$  Negative   '[ ]'$  All sytems reviewed and are negative  Cardiac: '[]'$  chest pain/pressure '[]'$  SOB '[]'$  DOE  Vascular: '[]'$  pain in legs while walking '[]'$  pain in feet when lying flat '[x]'$  hx of DVT '[]'$  swelling in legs  Pulmonary: '[]'$  asthma '[]'$  wheezing  Neurologic: '[]'$   weakness in '[]'$  arms '[]'$  legs '[]'$  numbness in '[]'$  arms '[]'$  legs '[]'$ difficulty speaking or slurred speech  Hematologic: '[]'$  bleeding problems  GI '[]'$  GERD  GU: '[x]'$  CKD/renal failure  '[x]'$  HD---'[x]'$  M/W/F '[]'$  T/T/S  Psychiatric: '[]'$  hx of major depression  Integumentary: '[]'$  rashes '[]'$  ulcers  Constitutional: '[]'$  fever '[]'$  chills   PHYSICAL EXAMINATION:  Today's Vitals   07/23/20 1143  BP: 101/66  Pulse: 66  Resp: 20  Temp: 98 F (36.7 C)  TempSrc: Temporal  SpO2: 97%  Weight: 284 lb 6.3 oz (129 kg)  Height: '5\' 3"'$  (1.6 m)   Body mass index is 50.38 kg/m.    General:  WDWN male in NAD Gait: Not observed- in WC HENT: WNL Pulmonary: normal non-labored breathing Cardiac: regular Abdomen: obese Skin: without rashes Vascular Exam/Pulses:   Right Left  Radial 2+ (normal) 2+ (normal)  AT Brisk biphasic doppler Brisk biphasic doppler  PT Brisk biphasic doppler Brisk biphasic doppler   Extremities:  without ischemic changes, without Gangrene, without open wounds; his fistula has an excellent thrill; motor and sensation are in tact left hand.  Musculoskeletal: no muscle wasting or atrophy  Neurologic: A&O X 3; Speech is fluent/normal  Non-Invasive Vascular Imaging:   Upper Extremity Vein Mapping on 07/23/2020: Diameter:  0.70-1.33cm Depth:  0.88-3.84cm   ASSESSMENT/PLAN: 45 y.o. male with ESRD here for evaluation of his hemodialysis access with hx of left 1st stage BVT on 04/08/2020 by Dr. Scot Dock  -pt's fistula has matured nicely and is ready for transposition.  Discussed with pt that he will require two maybe three incisions to superficialize the vein.  He expressed understanding.  -discussed with pt that his access will eventually need intervention or new access in the future as they will not last forever.  He expressed understanding.  -pt had been on coumadin for DVT in April 2021 that he was taken off of in April 2022 due to spontaneous hematoma in the right thigh.  His  right thigh is soft.  Discussed with Dr. Stanford Breed and okay to schedule for 2nd stage BVT.  This is pt's first DVT and was due to accident, therefore 3 months of anticoagulation should be sufficient but will defer to PCP for this -pt is on dialysis -pt is right hand dominant - will plan for 2nd stage BVT on a Tuesday with Dr. Scot Dock -of note, pt has brisk biphasic doppler signals bilateral feet.  He does have neuropathy and discussed with him to continue to protect his feet and inspect them daily    Leontine Locket, [facility] [phone]  Clinic MD:   Stanford Breed

## 2020-08-02 ENCOUNTER — Encounter (HOSPITAL_COMMUNITY): Payer: Self-pay | Admitting: Vascular Surgery

## 2020-08-02 NOTE — Progress Notes (Addendum)
Anesthesia Chart Review: Same day workup  Hx of coronary vasospasm (Cardiac cath 03/22/2019 showed mid LAD lesion 20% stenosis.  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  He presented to [facility] ED on 03/07/20 with with cardiac arrest due to "massive volume overload" 2/2 PD membrane failure (158 kg on admission, 119 kg at discharge). He required 12 minutes of CPR with ROSC, was intubated in ED and treated with cooling protocol. Self extubated on 01/08/2021 and respiratory status remained stable. Since his admission, his PD cath was removed and a right IJ tunnelled TDC was placed on 03/20/2020 by interventional radiology.  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 Scot Dock.  He has hx of left BC AVF.  Follows with neurology for history of CVA and seizures.  Last seen 04/23/2020.  Per note, "Stable from stroke and seizure standpoint without new stroke/TIA symptoms or seizure activity. Remains on Keppra XR 500 mg daily and lamotrigine 100 mg twice daily -tolerating without side effect. 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 However, I do not find any consistent documentation of this. TTE 05/22/19 and TEE 05/25/19 state normal AV, no mention of bioprosthetic valve. TTE 07/15/19 summary states the aortic valve is normal in structure  and function, but later in the report it states, "Echo findings are consistent with normal structure and function of the aortic valve prosthesis." TEE 07/19/19 reports normal AV, no mention of bioprosthetic valve. TTE 03/07/20 states AV is grossly normal, no mention of bioprosthetic valve.   Unable to reach patient via phone to confirm history. No answer and VM full. PAT RN will continue to try.  ADDENDUM 08/06/10:  PAT RN Marga Melnick spoke with pt for preop call; patient denied ever having aortic valve replacement.   Will need DOS labs and eval.  EKG 04/01/20: NSR. Rate 86.  TTE 03/07/20:  1. Left ventricular ejection fraction, by estimation, is 55 to 60%. The  left ventricle has normal function. The left ventricle has no regional  wall motion abnormalities. There is moderate concentric left ventricular  hypertrophy. Left ventricular  diastolic parameters are indeterminate.   2. Right ventricular systolic function is normal. The right ventricular  size is normal.   3. The mitral valve is normal in structure. Trivial mitral valve  regurgitation. No evidence of mitral stenosis.   4. The aortic valve is grossly normal. Aortic valve regurgitation is not  visualized. No aortic stenosis is present.   Comparison(s): No significant change from prior study.   LHC 03/22/19: 1.  Moderate one-vessel coronary artery disease involving the proximal right coronary artery.  Significant spasm noted in the ostial proximal RCA improved significantly with intracoronary nitroglycerin.  No other obstructive disease noted. 2.  Normal LV systolic function and mildly elevated left ventricular end-diastolic pressure.   Recommendations: No clear culprit is identified for non-ST elevation myocardial infarction.  Right coronary spasm is a  possibility.  Treating this might be difficult given the patient's recurrent syncope and hypotension.  I am going to add small dose Imdur 15 mg daily. Continue aggressive treatment of  risk factors.   Wynonia Musty [facility] Short Stay Center/Anesthesiology Phone [phone] 08/02/2020 3:57 PM

## 2020-08-02 NOTE — Progress Notes (Signed)
DUE TO COVID-19 ONLY ONE VISITOR IS ALLOWED TO COME WITH YOU AND STAY IN THE WAITING ROOM ONLY DURING PRE OP AND PROCEDURE DAY OF SURGERY.   PCP - Dr Anastasia Pall Cardiologist - Levell July, NP Neurology - Frann Rider, NP  Chest x-ray - 03/14/20 (1V) EKG - 04/01/20 Stress Test - 03/14/16  ECHO - 03/07/20 Cardiac Cath - 03/22/19  Patient has a Loop Recorder.  Last device check was on 07/19/20-normal.  Sleep Study -  Yes CPAP - does not use c[pap  Fasting Blood Sugar - 85 Checks Blood Sugar 4 times a day Diabetes, Type 2, No Meds, diet controlled  Do not take oral diabetes medicines (pills) the morning of surgery.  If your blood sugar is less than 70 mg/dL, you will need to treat for low blood sugar: Do not take insulin. Treat a low blood sugar (less than 70 mg/dL) with  cup of clear juice (cranberry or apple), 4 glucose tablets, OR glucose gel. Recheck blood sugar in 15 minutes after treatment (to make sure it is greater than 70 mg/dL). If your blood sugar is not greater than 70 mg/dL on recheck, call [phone] for further instructions.  Anesthesia review: Yes  STOP now taking any Aspirin (unless otherwise instructed by your surgeon), Aleve, Naproxen, Ibuprofen, Motrin, Advil, Goody's, BC's, all herbal medications, fish oil, and all vitamins.   Coronavirus Screening Covid test n/a - Ambulatory Surgery  Do you have any of the following symptoms:  Cough yes/no: No Fever (>100.67F)  yes/no: No Runny nose yes/no: No Sore throat yes/no: No Difficulty breathing/shortness of breath  yes/no: No  Have you traveled in the last 14 days and where? yes/no: No  Patient verbalized understanding of instructions that were given via phone.

## 2020-08-02 NOTE — Anesthesia Preprocedure Evaluation (Addendum)
Anesthesia Evaluation  Patient identified by MRN, date of birth, ID band Patient awake    Reviewed: Allergy & Precautions, NPO status , Patient's Chart, lab work & pertinent test results, reviewed documented beta blocker date and time   Airway Mallampati: III  TM Distance: >3 FB Neck ROM: Full    Dental  (+) Teeth Intact, Dental Advisory Given   Pulmonary sleep apnea , former smoker,    Pulmonary exam normal breath sounds clear to auscultation       Cardiovascular hypertension, Pt. on home beta blockers and Pt. on medications + Past MI, + Peripheral Vascular Disease and + DVT  Normal cardiovascular exam Rhythm:Regular Rate:Normal     Neuro/Psych Seizures -,  PSYCHIATRIC DISORDERS Anxiety Bipolar Disorder  Neuromuscular disease CVA    GI/Hepatic Neg liver ROS, GERD  ,  Endo/Other  diabetes, Type 2, Oral Hypoglycemic AgentsObesity   Renal/GU ESRF and DialysisRenal disease     Musculoskeletal  (+) Arthritis , Fibromyalgia -  Abdominal   Peds  Hematology negative hematology ROS (+)   Anesthesia Other Findings Day of surgery medications reviewed with the patient.  Reproductive/Obstetrics                            Anesthesia Physical Anesthesia Plan  ASA: 3  Anesthesia Plan: General   Post-op Pain Management:    Induction: Intravenous  PONV Risk Score and Plan: 2 and Midazolam, Dexamethasone and Ondansetron  Airway Management Planned: LMA  Additional Equipment:   Intra-op Plan:   Post-operative Plan: Extubation in OR  Informed Consent: I have reviewed the patients History and Physical, chart, labs and discussed the procedure including the risks, benefits and alternatives for the proposed anesthesia with the patient or authorized representative who has indicated his/her understanding and acceptance.     Dental advisory given  Plan Discussed with: CRNA  Anesthesia Plan  Comments: (PAT note by Karoline Caldwell, PA-C: Hx of coronary vasospasm (Cardiac cath 03/22/2019 showed mid LAD lesion 20% stenosis. 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 He presented to [facility] ED on02/10/22 with with cardiac arrest due to "massive volume overload" 2/2 PD membrane failure (158 kg on admission, 119 kg at discharge). He required 12 minutes of CPR with ROSC, was intubated in ED and treated with cooling protocol.Self extubated on 01/08/2021 and respiratory status remained stable. Since his admission, his PD cath was removed and a right IJ tunnelled TDC was placedon 03/20/2020 by interventional radiology.  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 Scot Dock.He has hx of left BC AVF.  Follows with neurology for history of CVA and seizures.  Last seen 04/23/2020.  Per note, "Stable from stroke and seizure standpoint without new stroke/TIA symptomsorseizure activity. Remains on KeppraXR500 mg daily and lamotrigine 100 mg twice daily-tolerating without side effect. 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 However, I  do not find any consistent documentation of this. TTE 05/22/19 and TEE 05/25/19 state normal AV, no mention of bioprosthetic valve. TTE 07/15/19 summary states the aortic valve is normal in structure and  function, but later in the report it states, "Echo findings are consistent with normal structure and function of the aortic valve prosthesis." TEE 07/19/19 reports normal AV, no mention of bioprosthetic valve. TTE 03/07/20 states AV is grossly normal, no mention of bioprosthetic valve.   Unable to reach patient via phone to confirm history. No answer and VM full. PAT RN will continue to try.  ADDENDUM 08/06/10:  PAT RN Marga Melnick spoke with pt for preop call; patient denied ever having aortic valve replacement.   Will need DOS labs and eval.  EKG 04/01/20: NSR. Rate 86.  TTE 03/07/20: 1. Left ventricular ejection fraction, by estimation, is 55 to 60%. The  left ventricle has normal function. The left ventricle has no regional  wall motion abnormalities. There is moderate concentric left ventricular  hypertrophy. Left ventricular  diastolic parameters are indeterminate.  2. Right ventricular systolic function is normal. The right ventricular  size is normal.  3. The mitral valve is normal in structure. Trivial mitral valve  regurgitation. No evidence of mitral stenosis.  4. The aortic valve is grossly normal. Aortic valve regurgitation is not  visualized. No aortic stenosis is present.   Comparison(s): No significant change from prior study.   LHC 03/22/19: 1. Moderate one-vessel coronary artery disease involving the proximal right coronary artery. Significant spasm noted in the ostial proximal RCA improved significantly with intracoronary nitroglycerin. No other obstructive disease noted. 2. Normal LV systolic function and mildly elevated left ventricular end-diastolic pressure.  Recommendations: No clear culprit is identified for non-ST elevation myocardial infarction. Right coronary spasm is a possibility. Treating this might be difficult given the patient's recurrent syncope and hypotension. I am going to add small dose Imdur 15 mg daily. Continue aggressive treatment of risk  factors. )      Anesthesia Quick Evaluation                                  Anesthesia Evaluation  Patient identified by MRN, date of birth, ID band Patient awake    Reviewed: Allergy & Precautions, NPO status , Patient's Chart, lab work & pertinent test results  Airway Mallampati: III  TM Distance: >3 FB Neck ROM: Full    Dental  (+) Teeth Intact, Dental Advisory Given   Pulmonary sleep apnea , former smoker,    breath sounds clear to auscultation       Cardiovascular hypertension, + Past MI   Rhythm:Regular Rate:Normal     Neuro/Psych Seizures -,   Neuromuscular disease CVA negative psych ROS   GI/Hepatic Neg liver ROS, GERD  Medicated,  Endo/Other  diabetes  Renal/GU Renal disease     Musculoskeletal  (+) Arthritis ,   Abdominal Normal abdominal exam  (+)   Peds  Hematology   Anesthesia Other Findings   Reproductive/Obstetrics                            Anesthesia Physical Anesthesia Plan  ASA: III  Anesthesia Plan: Regional   Post-op Pain Management:    Induction: Intravenous  PONV Risk Score and Plan: 3 and Ondansetron, Propofol infusion and Midazolam  Airway Management Planned: Simple Face Mask and Natural Airway  Additional Equipment: None  Intra-op Plan:   Post-operative Plan: Extubation in OR  Informed Consent: I have reviewed the patients History and Physical, chart, labs and discussed the procedure including the risks, benefits and alternatives for the proposed anesthesia with the patient or authorized representative who has indicated his/her understanding and acceptance.     Dental advisory given  Plan Discussed with: CRNA  Anesthesia Plan Comments:        Anesthesia Quick Evaluation                                   Anesthesia Evaluation  Patient identified by MRN, date of birth, ID band Patient awake    Reviewed: Allergy & Precautions, NPO status , Patient's Chart, lab work  & pertinent test results  Airway Mallampati: III  TM Distance: >3 FB Neck ROM: Full    Dental  (+) Teeth Intact, Dental Advisory Given   Pulmonary sleep apnea , former smoker,    breath sounds clear to auscultation       Cardiovascular hypertension, + Past MI   Rhythm:Regular Rate:Normal     Neuro/Psych Seizures -,   Neuromuscular disease CVA negative psych ROS   GI/Hepatic Neg liver ROS, GERD  Medicated,  Endo/Other  diabetes  Renal/GU Renal disease     Musculoskeletal  (+) Arthritis ,   Abdominal Normal abdominal exam  (+)   Peds  Hematology   Anesthesia Other Findings   Reproductive/Obstetrics                            Anesthesia Physical Anesthesia Plan  ASA: III  Anesthesia Plan: Regional   Post-op Pain Management:    Induction: Intravenous  PONV Risk Score and Plan: 3 and Ondansetron, Propofol infusion and Midazolam  Airway Management Planned: Simple Face Mask and Natural Airway  Additional Equipment: None  Intra-op Plan:   Post-operative Plan: Extubation in OR  Informed Consent: I have reviewed the patients History and Physical, chart, labs and discussed the procedure including the risks, benefits and alternatives for the proposed anesthesia with the patient or authorized representative who has indicated his/her understanding and acceptance.     Dental advisory given  Plan Discussed with: CRNA  Anesthesia Plan Comments:        Anesthesia Quick Evaluation

## 2020-08-04 DIAGNOSIS — R569 Unspecified convulsions: Secondary | ICD-10-CM

## 2020-08-04 HISTORY — DX: Unspecified convulsions: R56.9

## 2020-08-05 ENCOUNTER — Encounter (HOSPITAL_COMMUNITY): Payer: Self-pay | Admitting: Vascular Surgery

## 2020-08-05 ENCOUNTER — Other Ambulatory Visit: Payer: Self-pay | Admitting: Physical Medicine and Rehabilitation

## 2020-08-06 ENCOUNTER — Encounter (HOSPITAL_COMMUNITY)
Admission: RE | Disposition: A | Discharge status: Home / Self Care | Payer: Self-pay | Source: Home / Self Care | Attending: Vascular Surgery

## 2020-08-06 ENCOUNTER — Encounter (HOSPITAL_COMMUNITY): Payer: Self-pay | Admitting: Vascular Surgery

## 2020-08-06 ENCOUNTER — Ambulatory Visit (HOSPITAL_COMMUNITY): Payer: Medicare Other | Admitting: Physician Assistant

## 2020-08-06 ENCOUNTER — Other Ambulatory Visit: Payer: Self-pay

## 2020-08-06 ENCOUNTER — Ambulatory Visit (HOSPITAL_COMMUNITY)
Admission: RE | Admit: 2020-08-06 | Discharge: 2020-08-06 | Disposition: A | Discharge status: Home / Self Care | Payer: Medicare Other | Attending: Vascular Surgery | Admitting: Vascular Surgery

## 2020-08-06 DIAGNOSIS — N186 End stage renal disease: Secondary | ICD-10-CM

## 2020-08-06 DIAGNOSIS — Z79899 Other long term (current) drug therapy: Secondary | ICD-10-CM | POA: Diagnosis not present

## 2020-08-06 DIAGNOSIS — Z87891 Personal history of nicotine dependence: Secondary | ICD-10-CM | POA: Insufficient documentation

## 2020-08-06 DIAGNOSIS — Z8674 Personal history of sudden cardiac arrest: Secondary | ICD-10-CM | POA: Diagnosis not present

## 2020-08-06 DIAGNOSIS — I120 Hypertensive chronic kidney disease with stage 5 chronic kidney disease or end stage renal disease: Secondary | ICD-10-CM | POA: Insufficient documentation

## 2020-08-06 DIAGNOSIS — I252 Old myocardial infarction: Secondary | ICD-10-CM | POA: Insufficient documentation

## 2020-08-06 DIAGNOSIS — Y832 Surgical operation with anastomosis, bypass or graft as the cause of abnormal reaction of the patient, or of later complication, without mention of misadventure at the time of the procedure: Secondary | ICD-10-CM | POA: Insufficient documentation

## 2020-08-06 DIAGNOSIS — T82590A Other mechanical complication of surgically created arteriovenous fistula, initial encounter: Secondary | ICD-10-CM | POA: Diagnosis present

## 2020-08-06 DIAGNOSIS — E1122 Type 2 diabetes mellitus with diabetic chronic kidney disease: Secondary | ICD-10-CM | POA: Insufficient documentation

## 2020-08-06 DIAGNOSIS — T82898A Other specified complication of vascular prosthetic devices, implants and grafts, initial encounter: Secondary | ICD-10-CM | POA: Diagnosis not present

## 2020-08-06 DIAGNOSIS — Z992 Dependence on renal dialysis: Secondary | ICD-10-CM | POA: Diagnosis not present

## 2020-08-06 DIAGNOSIS — Z96649 Presence of unspecified artificial hip joint: Secondary | ICD-10-CM | POA: Diagnosis not present

## 2020-08-06 HISTORY — PX: BASCILIC VEIN TRANSPOSITION: SHX5742

## 2020-08-06 HISTORY — DX: Peripheral vascular disease, unspecified: I73.9

## 2020-08-06 HISTORY — DX: Personal history of other medical treatment: Z92.89

## 2020-08-06 HISTORY — DX: Fibromyalgia: M79.7

## 2020-08-06 HISTORY — DX: Hyperlipidemia, unspecified: E78.5

## 2020-08-06 HISTORY — DX: Bipolar disorder, unspecified: F31.9

## 2020-08-06 HISTORY — DX: Anxiety disorder, unspecified: F41.9

## 2020-08-06 LAB — POCT I-STAT, CHEM 8
BUN: 26 mg/dL — ABNORMAL HIGH (ref 6–20)
Calcium, Ion: 1.19 mmol/L (ref 1.15–1.40)
Chloride: 103 mmol/L (ref 98–111)
Creatinine, Ser: 9.3 mg/dL — ABNORMAL HIGH (ref 0.61–1.24)
Glucose, Bld: 102 mg/dL — ABNORMAL HIGH (ref 70–99)
HCT: 40 % (ref 39.0–52.0)
Hemoglobin: 13.6 g/dL (ref 13.0–17.0)
Potassium: 4.1 mmol/L (ref 3.5–5.1)
Sodium: 142 mmol/L (ref 135–145)
TCO2: 26 mmol/L (ref 22–32)

## 2020-08-06 LAB — GLUCOSE, CAPILLARY
Glucose-Capillary: 99 mg/dL (ref 70–99)
Glucose-Capillary: 76 mg/dL (ref 70–99)
Glucose-Capillary: 84 mg/dL (ref 70–99)

## 2020-08-06 SURGERY — TRANSPOSITION, VEIN, BASILIC
Anesthesia: General | Laterality: Left

## 2020-08-06 MED ORDER — HEPARIN 6000 UNIT IRRIGATION SOLUTION
Status: AC
  Filled 2020-08-06: qty 500

## 2020-08-06 MED ORDER — PROTAMINE SULFATE 10 MG/ML IV SOLN
INTRAVENOUS | Status: DC | PRN
  Administered 2020-08-06: 20 mg via INTRAVENOUS
  Administered 2020-08-06 (×2): 10 mg via INTRAVENOUS
  Administered 2020-08-06: 20 mg via INTRAVENOUS
  Administered 2020-08-06 (×2): 10 mg via INTRAVENOUS

## 2020-08-06 MED ORDER — MIDAZOLAM HCL 5 MG/5ML IJ SOLN
INTRAMUSCULAR | Status: DC | PRN
  Administered 2020-08-06: 2 mg via INTRAVENOUS

## 2020-08-06 MED ORDER — CHLORHEXIDINE GLUCONATE 4 % EX LIQD: 60.0000 mL | Freq: Once | CUTANEOUS | Status: DC

## 2020-08-06 MED ORDER — CHLORHEXIDINE GLUCONATE 0.12 % MT SOLN
15.0000 mL | Freq: Once | OROMUCOSAL | Status: AC
Start: 2020-08-06 — End: 2020-08-06

## 2020-08-06 MED ORDER — HEPARIN 6000 UNIT IRRIGATION SOLUTION
Status: DC | PRN
  Administered 2020-08-06: 1

## 2020-08-06 MED ORDER — PHENYLEPHRINE HCL-NACL 10-0.9 MG/250ML-% IV SOLN
INTRAVENOUS | Status: DC | PRN
  Administered 2020-08-06: 25 ug/min via INTRAVENOUS

## 2020-08-06 MED ORDER — ACETAMINOPHEN 500 MG PO TABS
1000.0000 mg | ORAL_TABLET | Freq: Once | ORAL | Status: AC
  Administered 2020-08-06: 1000 mg via ORAL
  Filled 2020-08-06: qty 2

## 2020-08-06 MED ORDER — 0.9 % SODIUM CHLORIDE (POUR BTL) OPTIME
TOPICAL | Status: DC | PRN
  Administered 2020-08-06: 1000 mL

## 2020-08-06 MED ORDER — LIDOCAINE 2% (20 MG/ML) 5 ML SYRINGE
INTRAMUSCULAR | Status: DC | PRN
  Administered 2020-08-06: 100 mg via INTRAVENOUS

## 2020-08-06 MED ORDER — CEFAZOLIN IN SODIUM CHLORIDE 3-0.9 GM/100ML-% IV SOLN
INTRAVENOUS | Status: AC
  Filled 2020-08-06: qty 100

## 2020-08-06 MED ORDER — DEXAMETHASONE SODIUM PHOSPHATE 10 MG/ML IJ SOLN
INTRAMUSCULAR | Status: AC
  Filled 2020-08-06: qty 1

## 2020-08-06 MED ORDER — MIDAZOLAM HCL 2 MG/2ML IJ SOLN
INTRAMUSCULAR | Status: AC
  Filled 2020-08-06: qty 2

## 2020-08-06 MED ORDER — LACTATED RINGERS IV SOLN: INTRAVENOUS | Status: DC

## 2020-08-06 MED ORDER — PHENYLEPHRINE 40 MCG/ML (10ML) SYRINGE FOR IV PUSH (FOR BLOOD PRESSURE SUPPORT)
PREFILLED_SYRINGE | INTRAVENOUS | Status: AC
  Filled 2020-08-06: qty 10

## 2020-08-06 MED ORDER — PROTAMINE SULFATE 10 MG/ML IV SOLN
INTRAVENOUS | Status: AC
  Filled 2020-08-06: qty 5

## 2020-08-06 MED ORDER — LIDOCAINE 2% (20 MG/ML) 5 ML SYRINGE
INTRAMUSCULAR | Status: AC
  Filled 2020-08-06: qty 5

## 2020-08-06 MED ORDER — CHLORHEXIDINE GLUCONATE 0.12 % MT SOLN
OROMUCOSAL | Status: AC
  Administered 2020-08-06: 15 mL
  Filled 2020-08-06: qty 15

## 2020-08-06 MED ORDER — FENTANYL CITRATE (PF) 100 MCG/2ML IJ SOLN: 25.0000 ug | INTRAMUSCULAR | Status: DC | PRN

## 2020-08-06 MED ORDER — ONDANSETRON HCL 4 MG/2ML IJ SOLN
INTRAMUSCULAR | Status: AC
  Filled 2020-08-06: qty 2

## 2020-08-06 MED ORDER — CEFAZOLIN IN SODIUM CHLORIDE 3-0.9 GM/100ML-% IV SOLN: 3.0000 g | INTRAVENOUS | Status: DC

## 2020-08-06 MED ORDER — ORAL CARE MOUTH RINSE
15.0000 mL | Freq: Once | OROMUCOSAL | Status: AC
Start: 2020-08-06 — End: 2020-08-06

## 2020-08-06 MED ORDER — PROPOFOL 10 MG/ML IV BOLUS
INTRAVENOUS | Status: DC | PRN
  Administered 2020-08-06: 130 mg via INTRAVENOUS

## 2020-08-06 MED ORDER — ONDANSETRON HCL 4 MG/2ML IJ SOLN: 4.0000 mg | Freq: Once | INTRAMUSCULAR | Status: DC | PRN

## 2020-08-06 MED ORDER — METOPROLOL TARTRATE 25 MG PO TABS
25.0000 mg | ORAL_TABLET | Freq: Once | ORAL | Status: AC
  Filled 2020-08-06: qty 1

## 2020-08-06 MED ORDER — CEFAZOLIN SODIUM-DEXTROSE 2-4 GM/100ML-% IV SOLN
INTRAVENOUS | Status: AC
  Filled 2020-08-06: qty 100

## 2020-08-06 MED ORDER — LIDOCAINE HCL (PF) 1 % IJ SOLN
INTRAMUSCULAR | Status: AC
  Filled 2020-08-06: qty 30

## 2020-08-06 MED ORDER — PHENYLEPHRINE 40 MCG/ML (10ML) SYRINGE FOR IV PUSH (FOR BLOOD PRESSURE SUPPORT)
PREFILLED_SYRINGE | INTRAVENOUS | Status: DC | PRN
  Administered 2020-08-06 (×5): 80 ug via INTRAVENOUS

## 2020-08-06 MED ORDER — ACETAMINOPHEN 500 MG PO TABS
ORAL_TABLET | ORAL | Status: AC
  Filled 2020-08-06: qty 2

## 2020-08-06 MED ORDER — HEPARIN SODIUM (PORCINE) 1000 UNIT/ML IJ SOLN
INTRAMUSCULAR | Status: AC
  Filled 2020-08-06: qty 1

## 2020-08-06 MED ORDER — DEXAMETHASONE SODIUM PHOSPHATE 10 MG/ML IJ SOLN
INTRAMUSCULAR | Status: DC | PRN
  Administered 2020-08-06: 4 mg via INTRAVENOUS

## 2020-08-06 MED ORDER — PROPOFOL 10 MG/ML IV BOLUS
INTRAVENOUS | Status: AC
  Filled 2020-08-06: qty 20

## 2020-08-06 MED ORDER — CEFAZOLIN SODIUM-DEXTROSE 2-3 GM-%(50ML) IV SOLR
INTRAVENOUS | Status: DC | PRN
  Administered 2020-08-06: 2 g via INTRAVENOUS

## 2020-08-06 MED ORDER — ONDANSETRON HCL 4 MG/2ML IJ SOLN
INTRAMUSCULAR | Status: DC | PRN
  Administered 2020-08-06: 4 mg via INTRAVENOUS

## 2020-08-06 MED ORDER — SODIUM CHLORIDE 0.9 % IV SOLN: INTRAVENOUS | Status: DC

## 2020-08-06 MED ORDER — FENTANYL CITRATE (PF) 250 MCG/5ML IJ SOLN
INTRAMUSCULAR | Status: DC | PRN
  Administered 2020-08-06 (×2): 50 ug via INTRAVENOUS
  Administered 2020-08-06 (×2): 25 ug via INTRAVENOUS

## 2020-08-06 MED ORDER — FENTANYL CITRATE (PF) 250 MCG/5ML IJ SOLN
INTRAMUSCULAR | Status: AC
  Filled 2020-08-06: qty 5

## 2020-08-06 MED ORDER — HEPARIN SODIUM (PORCINE) 1000 UNIT/ML IJ SOLN
INTRAMUSCULAR | Status: DC | PRN
  Administered 2020-08-06: 8000 [IU] via INTRAVENOUS

## 2020-08-06 MED ORDER — METOPROLOL TARTRATE 12.5 MG HALF TABLET
ORAL_TABLET | ORAL | Status: AC
  Administered 2020-08-06: 25 mg via ORAL
  Filled 2020-08-06: qty 2

## 2020-08-06 SURGICAL SUPPLY — 38 items
ARMBAND PINK RESTRICT EXTREMIT (MISCELLANEOUS) ×3 IMPLANT
BAG COUNTER SPONGE SURGICOUNT (BAG) ×2 IMPLANT
BAG SURGICOUNT SPONGE COUNTING (BAG) ×1
CANISTER SUCT 3000ML PPV (MISCELLANEOUS) ×3 IMPLANT
CANNULA VESSEL 3MM 2 BLNT TIP (CANNULA) ×3 IMPLANT
CLIP VESOCCLUDE MED 24/CT (CLIP) ×3 IMPLANT
CLIP VESOCCLUDE SM WIDE 24/CT (CLIP) ×3 IMPLANT
COVER PROBE W GEL 5X96 (DRAPES) IMPLANT
DECANTER SPIKE VIAL GLASS SM (MISCELLANEOUS) ×3 IMPLANT
DERMABOND ADVANCED (GAUZE/BANDAGES/DRESSINGS) ×4
DERMABOND ADVANCED .7 DNX12 (GAUZE/BANDAGES/DRESSINGS) ×2 IMPLANT
DRAIN PENROSE 1/4X12 LTX STRL (WOUND CARE) IMPLANT
ELECT REM PT RETURN 9FT ADLT (ELECTROSURGICAL) ×3
ELECTRODE REM PT RTRN 9FT ADLT (ELECTROSURGICAL) ×1 IMPLANT
GAUZE 4X4 16PLY ~~LOC~~+RFID DBL (SPONGE) ×3 IMPLANT
GLOVE SURG ENC MOIS LTX SZ7.5 (GLOVE) ×3 IMPLANT
GLOVE SURG UNDER POLY LF SZ6.5 (GLOVE) ×24 IMPLANT
GLOVE SURG UNDER POLY LF SZ9 (GLOVE) ×3 IMPLANT
GOWN STRL REUS W/ TWL LRG LVL3 (GOWN DISPOSABLE) ×4 IMPLANT
GOWN STRL REUS W/TWL LRG LVL3 (GOWN DISPOSABLE) ×8
HEMOSTAT SPONGE AVITENE ULTRA (HEMOSTASIS) IMPLANT
KIT BASIN OR (CUSTOM PROCEDURE TRAY) ×3 IMPLANT
KIT TURNOVER KIT B (KITS) ×3 IMPLANT
LOOP VESSEL MINI RED (MISCELLANEOUS) IMPLANT
NS IRRIG 1000ML POUR BTL (IV SOLUTION) ×3 IMPLANT
PACK CV ACCESS (CUSTOM PROCEDURE TRAY) ×3 IMPLANT
PAD ARMBOARD 7.5X6 YLW CONV (MISCELLANEOUS) ×6 IMPLANT
SUT PROLENE 6 0 BV (SUTURE) ×6 IMPLANT
SUT PROLENE 7 0 BV 1 (SUTURE) ×3 IMPLANT
SUT SILK 2 0 SH (SUTURE) IMPLANT
SUT VIC AB 3-0 SH 27 (SUTURE) ×6
SUT VIC AB 3-0 SH 27X BRD (SUTURE) ×3 IMPLANT
SUT VIC AB 4-0 PS2 18 (SUTURE) ×3 IMPLANT
SUT VICRYL 4-0 PS2 18IN ABS (SUTURE) ×6 IMPLANT
TAPE UMBILICAL 1/8X18 (MISCELLANEOUS) ×3 IMPLANT
TOWEL GREEN STERILE (TOWEL DISPOSABLE) ×3 IMPLANT
UNDERPAD 30X36 HEAVY ABSORB (UNDERPADS AND DIAPERS) ×3 IMPLANT
WATER STERILE IRR 1000ML POUR (IV SOLUTION) ×3 IMPLANT

## 2020-08-06 NOTE — Op Note (Signed)
Procedure: Second stage basilic transposition  Preoperative diagnosis: Not maturing AV fistula  Postoperative diagnosis: Same  Anesthesia: General  Assistant: Leontine Locket, PA-C to expedite procedure  Operative findings: 12 mm basilic vein  Operative details: After obtaining informed consent, the patient was taken the operating.  The patient was placed in supine position operating table.  After induction general anesthesia patient's left upper extremities prepped and draped in usual sterile fashion.  Longitudinal incision near the antecubital area was reopened carried down through the subcutaneous tissues down the level of an AV fistula.  It was dissected free circumferentially.  It was about 10 to 12 mm in diameter throughout its course.  Small side branches were ligated divided tween silk ties.  The vein was harvested through 2 additional skip incisions all the way up to the level of the axilla.  All nerves were protected during course of dissection.  At the level of the axilla but there was a very broad-based branch and I felt that if I ligated this the fistula would be slightly narrowed and venous outflow would be compromised.  Therefore I left this in place.  At this point a subcutaneous tunnel was created over the biceps muscle connecting the antecubital incision to the axillary incision.  The patient was given 8000 units of intravenous heparin.  The fistula was transected about 3 cm after its origin.  It was then brought through the subcutaneous tunnel and a new anastomosis created using a running 6-0 Prolene suture.  Just prior to completion of the anastomosis it was for blood backbled and thoroughly flushed.  Anastomosis was secured clamps released there is palpable thrill in fistula immediately.  Hemostasis was obtained with direct pressure and 80 mg of protamine.  The subcutaneous tissues of all 3 incisions were closed with running 3-0 Vicryl suture.  Skin of all 3 incisions closed with a  4-0 Vicryl subcuticular stitch.  Dermabond was applied.  The patient tolerated procedure well and there were no complications.  The instrument sponge and needle count was correct the end of the case.  Patient was taken recovery room in stable condition.  Ruta Hinds, MD [facility] [place] Office: [phone]

## 2020-08-06 NOTE — Interval H&P Note (Signed)
History and Physical Interval Note:  08/06/2020 10:05 AM  Max Pittman.  has presented today for surgery, with the diagnosis of ESRD.  The various methods of treatment have been discussed with the patient and family. After consideration of risks, benefits and other options for treatment, the patient has consented to  Procedure(s): LEFT ARM SECOND STAGE [place] (Left) as a surgical intervention.  The patient's history has been reviewed, patient examined, no change in status, stable for surgery.  I have reviewed the patient's chart and labs.  Questions were answered to the patient's satisfaction.     Ruta Hinds

## 2020-08-06 NOTE — Anesthesia Procedure Notes (Signed)
Procedure Name: LMA Insertion Date/Time: 08/06/2020 10:42 AM Performed by: Jenne Campus, CRNA Pre-anesthesia Checklist: Patient identified, Emergency Drugs available, Suction available and Patient being monitored Patient Re-evaluated:Patient Re-evaluated prior to induction Oxygen Delivery Method: Circle System Utilized Preoxygenation: Pre-oxygenation with 100% oxygen Induction Type: IV induction Ventilation: Mask ventilation without difficulty LMA: LMA inserted LMA Size: 4.0 Number of attempts: 1 Placement Confirmation: positive ETCO2 Tube secured with: Tape Dental Injury: Teeth and Oropharynx as per pre-operative assessment

## 2020-08-06 NOTE — Discharge Instructions (Signed)
   [facility]  Discharge Instructions  AV Fistula or Graft Surgery for Dialysis Access  Please refer to the following instructions for your post-procedure care. Your surgeon or physician assistant will discuss any changes with you.  Activity  You may drive the day following your surgery, if you are comfortable and no longer taking prescription pain medication. Resume full activity as the soreness in your incision resolves.  Bathing/Showering  You may shower after you go home. Keep your incision dry for 48 hours. Do not soak in a bathtub, hot tub, or swim until the incision heals completely. You may not shower if you have a hemodialysis catheter.  Incision Care  Clean your incision with mild soap and water after 48 hours. Pat the area dry with a clean towel. You do not need a bandage unless otherwise instructed. Do not apply any ointments or creams to your incision. You may have skin glue on your incision. Do not peel it off. It will come off on its own in about one week. Your arm may swell a bit after surgery. To reduce swelling use pillows to elevate your arm so it is above your heart. Your doctor will tell you if you need to lightly wrap your arm with an ACE bandage.  Diet  Resume your normal diet. There are not special food restrictions following this procedure. In order to heal from your surgery, it is CRITICAL to get adequate nutrition. Your body requires vitamins, minerals, and protein. Vegetables are the best source of vitamins and minerals. Vegetables also provide the perfect balance of protein. Processed food has little nutritional value, so try to avoid this.  Medications  Resume taking all of your medications. If your incision is causing pain, you may take over-the counter pain relievers such as acetaminophen (Tylenol). If you were prescribed a stronger pain medication, please be aware these medications can cause nausea and constipation. Prevent  nausea by taking the medication with a snack or meal. Avoid constipation by drinking plenty of fluids and eating foods with high amount of fiber, such as fruits, vegetables, and grains.  Do not take Tylenol if you are taking prescription pain medications.  Follow up Your surgeon may want to see you in the office following your access surgery. If so, this will be arranged at the time of your surgery.  Please call us immediately for any of the following conditions:  Increased pain, redness, drainage (pus) from your incision site Fever of 101 degrees or higher Severe or worsening pain at your incision site Hand pain or numbness.  Reduce your risk of vascular disease:  Stop smoking. If you would like help, call [facility] at 1-800-QUIT-NOW [phone]) or [facility] at [place] your cholesterol Maintain a desired weight Control your diabetes Keep your blood pressure down  Dialysis  It will take several weeks to several months for your new dialysis access to be ready for use. Your surgeon will determine when it is okay to use it. Your nephrologist will continue to direct your dialysis. You can continue to use your Permcath until your new access is ready for use.   08/06/2020 Max Pittman [id number] 1976/01/05  Surgeon(s): Fields, Jessy Oto, MD  Procedure(s): LEFT ARM SECOND STAGE BASILIC VEIN TRANSPOSITION  x Do not stick fistula for 6 weeks    If you have any questions, please call the office at [phone].

## 2020-08-06 NOTE — Progress Notes (Signed)
Pt significant other [name] calls regarding pt. Incision site at the bend of the elbow is bleeding. Pt wife noticed bleeding when she was getting pt in the bed and pt's t-shirt was "covered in blood on the side of the incision". Dr. Nona Dell made aware and per MD for pt and pt wife to apply constant pressure on bleeding site for 15 mins, if bleeding continues to report to ED for further evaluation.

## 2020-08-06 NOTE — Progress Notes (Signed)
[facility] Summary Report / Loop Recorder 

## 2020-08-06 NOTE — Transfer of Care (Signed)
Immediate Anesthesia Transfer of Care Note  Patient: Max Pittman.  Procedure(s) Performed: LEFT ARM SECOND STAGE BASCILIC VEIN TRANSPOSITION (Left)  Patient Location: PACU  Anesthesia Type:General  Level of Consciousness: oriented, sedated and patient cooperative  Airway & Oxygen Therapy: Patient Spontanous Breathing and Patient connected to nasal cannula oxygen  Post-op Assessment: Report given to RN and Post -op Vital signs reviewed and stable  Post vital signs: Reviewed  Last Vitals:  Vitals Value Taken Time  BP 144/89 08/06/20 1320  Temp 36.6 C 08/06/20 1320  Pulse 82 08/06/20 1326  Resp 12 08/06/20 1326  SpO2 97 % 08/06/20 1326  Vitals shown include unvalidated device data.  Last Pain:  Vitals:   08/06/20 0808  TempSrc:   PainSc: 8          Complications: No notable events documented.

## 2020-08-07 ENCOUNTER — Encounter (HOSPITAL_COMMUNITY): Payer: Self-pay | Admitting: Vascular Surgery

## 2020-08-07 NOTE — Anesthesia Postprocedure Evaluation (Signed)
Anesthesia Post Note  Patient: Max Pittman.  Procedure(s) Performed: LEFT ARM SECOND STAGE BASCILIC VEIN TRANSPOSITION (Left)     Patient location during evaluation: PACU Anesthesia Type: General Level of consciousness: awake and alert Pain management: pain level controlled Vital Signs Assessment: post-procedure vital signs reviewed and stable Respiratory status: spontaneous breathing, nonlabored ventilation, respiratory function stable and patient connected to nasal cannula oxygen Cardiovascular status: blood pressure returned to baseline and stable Postop Assessment: no apparent nausea or vomiting Anesthetic complications: no   No notable events documented.  Last Vitals:  Vitals:   08/06/20 1335 08/06/20 1350  BP: (!) 150/92 (!) 155/94  Pulse: 83 84  Resp: 13 11  Temp:  (!) 36.4 C  SpO2: 96% 95%    Last Pain:  Vitals:   08/06/20 1350  TempSrc:   PainSc: 0-No pain                 Catalina Gravel

## 2020-08-14 ENCOUNTER — Other Ambulatory Visit: Payer: Self-pay | Admitting: Physical Medicine and Rehabilitation

## 2020-08-15 ENCOUNTER — Encounter
Payer: Medicare Other | Attending: Physical Medicine and Rehabilitation | Admitting: Physical Medicine and Rehabilitation

## 2020-08-15 DIAGNOSIS — E1142 Type 2 diabetes mellitus with diabetic polyneuropathy: Secondary | ICD-10-CM | POA: Insufficient documentation

## 2020-08-15 DIAGNOSIS — R5381 Other malaise: Secondary | ICD-10-CM | POA: Insufficient documentation

## 2020-08-15 DIAGNOSIS — G4701 Insomnia due to medical condition: Secondary | ICD-10-CM | POA: Insufficient documentation

## 2020-08-19 ENCOUNTER — Ambulatory Visit (INDEPENDENT_AMBULATORY_CARE_PROVIDER_SITE_OTHER): Payer: Medicare Other

## 2020-08-19 DIAGNOSIS — I639 Cerebral infarction, unspecified: Secondary | ICD-10-CM | POA: Diagnosis not present

## 2020-08-22 LAB — CUP PACEART REMOTE DEVICE CHECK
Date Time Interrogation Session: 20220727202321
Implantable Pulse Generator Implant Date: 20210429

## 2020-08-29 ENCOUNTER — Other Ambulatory Visit: Payer: Self-pay | Admitting: Infectious Diseases

## 2020-09-03 ENCOUNTER — Other Ambulatory Visit: Payer: Self-pay | Admitting: Physical Medicine and Rehabilitation

## 2020-09-05 ENCOUNTER — Other Ambulatory Visit: Payer: Self-pay

## 2020-09-05 ENCOUNTER — Ambulatory Visit (INDEPENDENT_AMBULATORY_CARE_PROVIDER_SITE_OTHER): Payer: Medicare Other | Admitting: Physician Assistant

## 2020-09-05 VITALS — BP 121/76 | HR 83 | Temp 98.0°F

## 2020-09-05 DIAGNOSIS — N186 End stage renal disease: Secondary | ICD-10-CM
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
## 2020-09-06 ENCOUNTER — Other Ambulatory Visit: Payer: Self-pay | Admitting: Infectious Diseases

## 2020-09-06 DIAGNOSIS — T847XXD Infection and inflammatory reaction due to other internal orthopedic prosthetic devices, implants and grafts, subsequent encounter: Secondary | ICD-10-CM

## 2020-09-06 NOTE — Telephone Encounter (Signed)
Please advise regarding refill. Patient has not followed up since 11/2019. One month supply of cephalexin last sent in 03/2020. 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

## 2020-09-06 NOTE — Telephone Encounter (Signed)
Spoke with patient, he says he has not missed any cephalexin doses. He states he has enough to get him through the weekend. Per Care everywhere, patient has not been seeing an infectious disease provider. He accepts follow up appointment with Janene Madeira, NP 09/19/20. Please advise if okay to refill 30 day supply to bridge until appointment.

## 2020-09-08 ENCOUNTER — Other Ambulatory Visit: Payer: Self-pay | Admitting: Physical Medicine and Rehabilitation

## 2020-09-09 ENCOUNTER — Other Ambulatory Visit: Payer: Self-pay | Admitting: Physical Medicine and Rehabilitation

## 2020-09-10 MED ORDER — AMITRIPTYLINE HCL 75 MG PO TABS: 75.0000 mg | ORAL_TABLET | Freq: Every day | ORAL | 1 refills | Status: DC

## 2020-09-12 NOTE — Progress Notes (Signed)
[facility] Summary Report / Loop Recorder 

## 2020-09-15 ENCOUNTER — Other Ambulatory Visit: Payer: Self-pay | Admitting: Physical Medicine and Rehabilitation

## 2020-09-19 ENCOUNTER — Ambulatory Visit: Payer: Medicare Other | Admitting: Infectious Diseases

## 2020-09-19 ENCOUNTER — Other Ambulatory Visit: Payer: Self-pay

## 2020-09-19 ENCOUNTER — Ambulatory Visit (INDEPENDENT_AMBULATORY_CARE_PROVIDER_SITE_OTHER): Payer: Medicare Other

## 2020-09-19 ENCOUNTER — Telehealth (INDEPENDENT_AMBULATORY_CARE_PROVIDER_SITE_OTHER): Payer: Medicare Other | Admitting: Infectious Diseases

## 2020-09-19 ENCOUNTER — Other Ambulatory Visit: Payer: Self-pay | Admitting: Physical Medicine and Rehabilitation

## 2020-09-19 ENCOUNTER — Encounter: Payer: Self-pay | Admitting: Infectious Diseases

## 2020-09-19 DIAGNOSIS — T847XXD Infection and inflammatory reaction due to other internal orthopedic prosthetic devices, implants and grafts, subsequent encounter: Secondary | ICD-10-CM

## 2020-09-19 DIAGNOSIS — I639 Cerebral infarction, unspecified: Secondary | ICD-10-CM | POA: Diagnosis not present

## 2020-09-19 NOTE — Assessment & Plan Note (Addendum)
Mr. 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 He continues to have activity restrictions and is confined to wheelchair/bed. He does have chronic pain related to this problem. He denies any chills/fevers, swelling or external signs of infection involving the left hip. It seems as if he is tolerating his antibiotics well without s/e.    Since I have seen him he has been through a lot which certainly impacts his surgical candidacy at this time.  He will need further instrumentation and prosthesis placement if he wants to regain function and mobility in the future.  In review of the notes available from the [facility] team from May 2022 overall his inflammatory markers were much improved and CRP normalized.  Hip x-ray revealed intact hardware with no periprosthetic lucency.  Healed fracture deformity of the right inferior pubic ramus.  He has not followed up with the Ortho ID clinic.  He would like to talk further with Dr. Marcelino Scot first and plans to schedule a follow-up with him soon.  I explained to Mr. Ouellette that the only way we can tell if his infection is truly gone is if we stop antibiotics.  At this time we do not have a definitive surgical plan for him I do recommend he continues his suppressive antibiotics.  His current dose is acceptable for HD.  Reminded him to make sure he takes second dose after his HD session.    I do not see much value in repeating inflammatory markers presently as they were recently normal and he has several things that will confound specificity of results.  Clinically it sounds like he is stable with no signs of active infection.  Will schedule follow-up visit with Jaydus again in December to get an update regarding any orthopedic plans.

## 2020-09-19 NOTE — Progress Notes (Signed)
Name: Max Pittman.  DOB: December 19, 1975 MRN: [id number] PCP: Chesley Noon, MD     Subjective:   Chief Complaint  Patient presents with   Follow-up      HPI: Max Pittman. is a 45 y.o. male with multiple medical problems listed below here for follow up on infected hardware of Lt hip.   Originally with ORIF Lt ORIF of pelvis and acetabulum 05/22/2019 for hip fracture.  June 2021 hospitalized with MSSA bacteremia secondary to a large left hip abscess around hardware. I&D and s/p girdlestone for septic arthritis/femoral osteomyelitis with Dr. Marcelino Scot with retention of hardware and plans for further definitive surgery consideration later.  Treated with 6 weeks IV cefazolin with transition to renally doses cephalexin 250 mg BID, of which he has continued every day without missed doses.   Repeated imaging done 09-2019 d/t significantly elevated inflammatory markers following antibiotics >> fragmentation of Lt femoral head with extensive erosive changes of Lt acetabulum, large joint effusion. Dr. Marcelino Scot referred him to [facility] clinic for consideration of further intervention given unstable hardware and joint.   LOV in November 2021 with [facility] >> was doing well on suppressive cephalexin. Waiting for ortho/ID appt at [facility]. Continued on suppressive regimen. No changes in hip pain/quality. Incision looked good. Wheelchair bound with non-weight bearing instructions.   Unfortunately since Mr. Carrier last office visit he had a prolonged hospitalization for MI s/p cardiac arrest and stenting in Feb 2022.   He was continued on cefazolin IV during ICU stay when he was intubated and then continued on Cephalexin BID for suppression throughout rehab. PD catheter removed and transitioned to hemodialysis M-W-F via AVF.   He did see [facility] team 06/19/2020 >> he says he will need further hardware to rebuild hip joint but "need to be sure he has eradicated infection prior to further arthroplasty."  Referral  to ortho/ID clinic with Dr. Susa Griffins but he has not heard from this appt yet.  ESR 17, CRP < 1 Hip XRay 06/19/2020 >  Hardware: Postsurgical changes compatible with ORIF of the left acetabulum with Girdlestone procedure. Visualized hardware is intact. No periprosthetic lucency. Diffuse bony demineralization. Cortical irregularity at the superior  acetabulum and inferior acetabulum/ischium, likely sequela of prior  fractures. Possible healed fracture deformity of the right inferior pubic  ramus.  Max Pittman has significant hip pain with any weight bearing activity - though he is supposed to stay off the hip he does stand occasionally which is most severe pain inducing. He is on chronic narcotics to help manage this from another provider.   Review of Systems  Constitutional:  Negative for chills and fever.  HENT:  Negative for tinnitus.   Eyes:  Negative for blurred vision and photophobia.  Respiratory:  Negative for cough and sputum production.   Cardiovascular:  Negative for chest pain.  Gastrointestinal:  Negative for diarrhea, nausea and vomiting.  Musculoskeletal:  Positive for joint pain.  Skin:  Negative for rash.  Neurological:  Negative for headaches.    Past Medical History:  Diagnosis Date   Anxiety    Arthritis    Bipolar disorder ([place])    Closed dislocation of left hip ([place]) 05/21/2019   Diabetes ([place])    Diabetes mellitus without complication (HCC)    no meds, type 2   DVT (deep venous thrombosis) (HCC)    Fibromyalgia    History of blood transfusion    w/surgery   History of peritoneal dialysis  HLD (hyperlipidemia)    on lipitor   Hypertension    Peripheral vascular disease ([place])    Renal disorder    FFGS - dialysis mon wed fri   Renal disorder    Seizure ([place]) 08/04/2020   "SILENT SEIZURE" PER PATIENT   Sleep apnea    does not use cpap   Stroke [facility])    when ha was a child and as adult 03/07/20   Vasovagal syndrome    with syncope    Outpatient  Medications Prior to Visit  Medication Sig Dispense Refill   amitriptyline (ELAVIL) 75 MG tablet Take 1 tablet (75 mg total) by mouth at bedtime. 30 tablet 1   atorvastatin (LIPITOR) 40 MG tablet TAKE 1 TABLET BY MOUTH DAILY AT 6 PM. 31 tablet 0   baclofen (LIORESAL) 10 MG tablet baclofen 10 mg tablet  TAKE ONE TABLET (10 MG DOSE) BY MOUTH 3 (THREE) TIMES A DAY FOR 30 DAYS.     buPROPion (WELLBUTRIN SR) 150 MG 12 hr tablet Take 1 tablet (150 mg total) by mouth 2 (two) times daily with a meal. 60 tablet 0   cephALEXin (KEFLEX) 250 MG capsule TAKE 1 CAPSULE (250 MG TOTAL) BY MOUTH EVERY 12 (TWELVE) HOURS 60 capsule 0   Cholecalciferol 25 MCG (1000 UT) tablet cholecalciferol (vitamin D3) 25 mcg (1,000 unit) tablet   2000 units by oral route.     [employer] ANTI-ITCH lotion APPLY TOPICALLY AS NEEDED FOR ITCHING. 222 mL 0   docusate sodium (COLACE) 100 MG capsule TAKE 1 CAPSULE BY MOUTH TWICE A DAY 60 capsule 0   FLUoxetine (PROZAC) 10 MG tablet Take 1 tablet (10 mg total) by mouth daily. (Patient taking differently: Take 20 mg by mouth daily.) 30 tablet 0   gabapentin (NEURONTIN) 300 MG capsule Take 1 capsule (300 mg total) by mouth 3 (three) times daily. 90 capsule 2   hydrALAZINE (APRESOLINE) 25 MG tablet Take 1 tablet by mouth 3 (three) times daily.     isosorbide mononitrate (IMDUR) 30 MG 24 hr tablet Take 0.5 tablets (15 mg total) by mouth daily. 15 tablet 0   lamoTRIgine (LAMICTAL) 100 MG tablet TAKE 1 TABLET BY MOUTH TWICE A DAY 60 tablet 11   levETIRAcetam (KEPPRA) 500 MG tablet Take 1 tablet (500 mg total) by mouth daily. (Patient taking differently: Take 500 mg by mouth at bedtime.) 90 tablet 3   loratadine (CLARITIN) 10 MG tablet Take 1 tablet (10 mg total) by mouth daily. (Patient taking differently: Take 10 mg by mouth daily as needed.) 30 tablet 0   meclizine (ANTIVERT) 25 MG tablet TAKE 1 TABLET BY MOUTH THREE TIMES A DAY 90 tablet 0   melatonin 5 MG TABS Take 1 tablet (5 mg total) by mouth  at bedtime. 30 tablet 0   multivitamin (RENA-VIT) TABS tablet Take 1 tablet by mouth at bedtime. 30 tablet 0   oxyCODONE-acetaminophen (PERCOCET) 7.5-325 MG tablet Take by mouth.     pantoprazole (PROTONIX) 40 MG tablet TAKE 1 TABLET BY MOUTH EVERY DAY 30 tablet 0   polyethylene glycol (MIRALAX / GLYCOLAX) 17 g packet Take 17 g by mouth daily. (Patient taking differently: Take 17 g by mouth daily as needed.) 30 each 0   metoprolol tartrate (LOPRESSOR) 25 MG tablet Take 0.5 tablets (12.5 mg total) by mouth 2 (two) times daily. 30 tablet 0   oxyCODONE-acetaminophen (PERCOCET/ROXICET) 5-325 MG tablet Take 1-2 tablets by mouth every 6 (six) hours as needed for severe pain. (Patient  not taking: Reported on 09/19/2020) 56 tablet 0   ranolazine (RANEXA) 500 MG 12 hr tablet ranolazine ER 500 mg tablet,extended release,12 hr  TAKE 1 TABLET BY MOUTH TWICE A DAY (Patient not taking: Reported on 09/19/2020)     sevelamer carbonate (RENVELA) 800 MG tablet Take 2 tablets (1,600 mg total) by mouth 3 (three) times daily with meals. (Patient not taking: Reported on 09/19/2020) 240 tablet 0   No facility-administered medications prior to visit.     Allergies  Allergen Reactions   Doxycycline Hives   Latex Swelling    Pt reports swelling at site.    Morphine Anxiety    Social History   Tobacco Use   Smoking status: Former    Types: Cigarettes    Quit date: 11/16/2018    Years since quitting: 1.8   Smokeless tobacco: Never  Vaping Use   Vaping Use: Never used  Substance Use Topics   Alcohol use: Not Currently    Comment: occ   Drug use: Never      Objective:   There were no vitals filed for this visit.  There is no height or weight on file to calculate BMI.  Physical Exam Constitutional:      Appearance: Normal appearance. He is not ill-appearing.  HENT:     Head: Normocephalic.     Mouth/Throat:     Mouth: Mucous membranes are moist.     Pharynx: Oropharynx is clear.  Eyes:      General: No scleral icterus. Pulmonary:     Effort: Pulmonary effort is normal.  Musculoskeletal:        General: Normal range of motion.     Cervical back: Normal range of motion.  Skin:    Coloration: Skin is not jaundiced or pale.  Neurological:     Mental Status: He is alert and oriented to person, place, and time.  Psychiatric:        Mood and Affect: Mood normal.        Judgment: Judgment normal.    Lab Results Lab Results  Component Value Date   WBC 9.4 04/05/2020   HGB 13.6 08/06/2020   HCT 40.0 08/06/2020   MCV 91.9 04/05/2020   PLT 278 04/05/2020    Lab Results  Component Value Date   CREATININE 9.30 (H) 08/06/2020   BUN 26 (H) 08/06/2020   NA 142 08/06/2020   K 4.1 08/06/2020   CL 103 08/06/2020   CO2 28 04/05/2020    Lab Results  Component Value Date   ALT 5 03/23/2020   AST 19 03/23/2020   ALKPHOS 73 03/23/2020   BILITOT 0.6 03/23/2020    Lab Results  Component Value Date   CHOL 82 05/22/2019   HDL 25 (L) 05/22/2019   LDLCALC 34 05/22/2019   TRIG 208 (H) 03/11/2020   CHOLHDL 3.3 05/22/2019   No results found for: HIV1RNAQUANT, HIV1RNAVL, CD4TABS   Assessment & Plan:   Problem List Items Addressed This Visit       High   Hardware complicating wound infection [facility])    Mr. 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 He continues to have activity restrictions and is confined to wheelchair/bed. He does have chronic pain related to this problem. He denies any chills/fevers, swelling or external signs of infection involving the left hip. It seems as if he is tolerating his antibiotics well without s/e.    Since I have  seen him he has been through a lot which certainly impacts his surgical candidacy at this time.  He will need further instrumentation and prosthesis placement if he wants to regain function and mobility in the future.  In review of the notes available from  the [facility] team from May 2022 overall his inflammatory markers were much improved and CRP normalized.  Hip x-ray revealed intact hardware with no periprosthetic lucency.  Healed fracture deformity of the right inferior pubic ramus.  He has not followed up with the Ortho ID clinic.  He would like to talk further with Dr. Marcelino Scot first and plans to schedule a follow-up with him soon.  I explained to Mr. Ayyad that the only way we can tell if his infection is truly gone is if we stop antibiotics.  At this time we do not have a definitive surgical plan for him I do recommend he continues his suppressive antibiotics.  His current dose is acceptable for HD.  Reminded him to make sure he takes second dose after his HD session.    I do not see much value in repeating inflammatory markers presently as they were recently normal and he has several things that will confound specificity of results.  Clinically it sounds like he is stable with no signs of active infection.  Will schedule follow-up visit with [place] again in December to get an update regarding any orthopedic plans.        Janene Madeira, MSN, NP-C [facility] for [facility] [place] Pager: [phone] Office: [phone]

## 2020-09-21 ENCOUNTER — Other Ambulatory Visit: Payer: Self-pay | Admitting: Physical Medicine and Rehabilitation

## 2020-09-24 LAB — CUP PACEART REMOTE DEVICE CHECK
Date Time Interrogation Session: 20220829202146
Implantable Pulse Generator Implant Date: 20210429

## 2020-09-26 ENCOUNTER — Telehealth: Payer: Self-pay

## 2020-09-26 ENCOUNTER — Other Ambulatory Visit: Payer: Self-pay

## 2020-09-26 DIAGNOSIS — N186 End stage renal disease: Secondary | ICD-10-CM

## 2020-09-26 NOTE — Telephone Encounter (Signed)
Patient is s/p BVT in July by [facility]. They have been using the arm for access but are only able to use one needle, the other stick is too deep. They have been using the [facility] as well. Placed patient on the schedule for HD duplex and follow up.

## 2020-10-03 ENCOUNTER — Encounter (HOSPITAL_COMMUNITY): Payer: Medicare Other

## 2020-10-03 ENCOUNTER — Ambulatory Visit: Payer: Medicare Other

## 2020-10-03 NOTE — Progress Notes (Signed)
[facility] Summary Report / Loop Recorder 

## 2020-10-07 ENCOUNTER — Telehealth: Payer: Self-pay

## 2020-10-07 NOTE — Telephone Encounter (Signed)
done

## 2020-10-07 NOTE — Telephone Encounter (Signed)
Refill request for Atorvastatin 40 mg. Please advise.

## 2020-10-10 ENCOUNTER — Encounter (HOSPITAL_COMMUNITY): Payer: Medicare Other

## 2020-10-10 ENCOUNTER — Ambulatory Visit: Payer: Medicare Other

## 2020-10-12 ENCOUNTER — Other Ambulatory Visit: Payer: Self-pay | Admitting: Infectious Diseases

## 2020-10-12 DIAGNOSIS — T847XXD Infection and inflammatory reaction due to other internal orthopedic prosthetic devices, implants and grafts, subsequent encounter: Secondary | ICD-10-CM

## 2020-10-14 MED ORDER — CEPHALEXIN 250 MG PO CAPS: 250.0000 mg | ORAL_CAPSULE | Freq: Two times a day (BID) | ORAL | 11 refills | Status: DC

## 2020-10-14 NOTE — Telephone Encounter (Signed)
Yes - I adjusted to 1 year worth of refills. He may have a long way to go before any definitive surgery to remove the hardware and should be kept on suppression.  Thank you

## 2020-10-14 NOTE — Telephone Encounter (Signed)
Ok to continue

## 2020-10-17 ENCOUNTER — Other Ambulatory Visit: Payer: Self-pay

## 2020-10-17 ENCOUNTER — Ambulatory Visit (INDEPENDENT_AMBULATORY_CARE_PROVIDER_SITE_OTHER): Payer: Medicare Other | Admitting: Physician Assistant

## 2020-10-17 ENCOUNTER — Ambulatory Visit (HOSPITAL_COMMUNITY)
Admission: RE | Admit: 2020-10-17 | Discharge: 2020-10-17 | Disposition: A | Discharge status: Home / Self Care | Payer: Medicare Other | Source: Ambulatory Visit | Attending: Physician Assistant | Admitting: Physician Assistant

## 2020-10-17 VITALS — BP 130/83 | HR 97 | Temp 97.7°F | Resp 20 | Ht 69.0 in | Wt 230.4 lb

## 2020-10-17 DIAGNOSIS — N186 End stage renal disease: Secondary | ICD-10-CM

## 2020-10-17 NOTE — Progress Notes (Signed)
POST OPERATIVE OFFICE NOTE    CC:  F/u for surgery  HPI:  This is a 45 y.o. male who is s/p second stage  on 08/06/20 by Dr. Oneida Alar.  Preoperative diagnosis was poorly maturing basilic fistula prior to the second stage procedure. The duplex showed the size of the fistula to be > 1.0 cm throughout it's course and deep > 1.0 cm to the skin surface.    Pt returns today for follow up.  Pt states HD has been unsuccessful and they having to use the [facility] for HD.  He has noticed increased appearence of veins throughout his left UE.   He denise fever, chills or symptoms of steal.    Allergies  Allergen Reactions   Doxycycline Hives   Latex Swelling    Pt reports swelling at site.    Morphine Anxiety    Current Outpatient Medications  Medication Sig Dispense Refill   amitriptyline (ELAVIL) 25 MG tablet Take 25 mg by mouth at bedtime.     amitriptyline (ELAVIL) 75 MG tablet Take 1 tablet (75 mg total) by mouth at bedtime. 30 tablet 1   amLODipine (NORVASC) 5 MG tablet Take 5 mg by mouth daily.     atorvastatin (LIPITOR) 40 MG tablet TAKE 1 TABLET BY MOUTH DAILY AT 6 PM. 31 tablet 0   baclofen (LIORESAL) 10 MG tablet baclofen 10 mg tablet  TAKE ONE TABLET (10 MG DOSE) BY MOUTH 3 (THREE) TIMES A DAY FOR 30 DAYS.     Blood Glucose Monitoring Suppl (GLUCOCOM BLOOD GLUCOSE MONITOR) DEVI Use to check blood sugar daily. E11.40     buPROPion (WELLBUTRIN SR) 150 MG 12 hr tablet Take 1 tablet (150 mg total) by mouth 2 (two) times daily with a meal. 60 tablet 0   cephALEXin (KEFLEX) 250 MG capsule Take 1 capsule (250 mg total) by mouth every 12 (twelve) hours. 60 capsule 11   Cholecalciferol 25 MCG (1000 UT) tablet cholecalciferol (vitamin D3) 25 mcg (1,000 unit) tablet   2000 units by oral route.     [employer] ANTI-ITCH lotion APPLY TOPICALLY AS NEEDED FOR ITCHING. 222 mL 0   docusate sodium (COLACE) 100 MG capsule TAKE 1 CAPSULE BY MOUTH TWICE A DAY 60 capsule 0   FLUoxetine (PROZAC) 10 MG tablet Take 1  tablet (10 mg total) by mouth daily. (Patient taking differently: Take 20 mg by mouth daily.) 30 tablet 0   gabapentin (NEURONTIN) 300 MG capsule TAKE 1 CAPSULE BY MOUTH THREE TIMES A DAY 90 capsule 2   hydrALAZINE (APRESOLINE) 25 MG tablet Take 1 tablet by mouth 3 (three) times daily.     isosorbide mononitrate (IMDUR) 30 MG 24 hr tablet Take 0.5 tablets (15 mg total) by mouth daily. 15 tablet 0   lamoTRIgine (LAMICTAL) 100 MG tablet TAKE 1 TABLET BY MOUTH TWICE A DAY 60 tablet 11   levETIRAcetam (KEPPRA) 500 MG tablet Take 1 tablet (500 mg total) by mouth daily. (Patient taking differently: Take 500 mg by mouth at bedtime.) 90 tablet 3   loratadine (CLARITIN) 10 MG tablet Take 1 tablet (10 mg total) by mouth daily. (Patient taking differently: Take 10 mg by mouth daily as needed.) 30 tablet 0   meclizine (ANTIVERT) 25 MG tablet TAKE 1 TABLET BY MOUTH THREE TIMES A DAY 90 tablet 0   melatonin 5 MG TABS Take 1 tablet (5 mg total) by mouth at bedtime. 30 tablet 0   multivitamin (RENA-VIT) TABS tablet Take 1 tablet by mouth at  bedtime. 30 tablet 0   oxyCODONE-acetaminophen (PERCOCET) 7.5-325 MG tablet Take by mouth.     oxyCODONE-acetaminophen (PERCOCET/ROXICET) 5-325 MG tablet Take 1-2 tablets by mouth every 6 (six) hours as needed for severe pain. 56 tablet 0   pantoprazole (PROTONIX) 40 MG tablet TAKE 1 TABLET BY MOUTH EVERY DAY 30 tablet 0   polyethylene glycol (MIRALAX / GLYCOLAX) 17 g packet Take 17 g by mouth daily. (Patient taking differently: Take 17 g by mouth daily as needed.) 30 each 0   ranolazine (RANEXA) 500 MG 12 hr tablet      sevelamer carbonate (RENVELA) 800 MG tablet Take 2 tablets (1,600 mg total) by mouth 3 (three) times daily with meals. 240 tablet 0   Vitamin D, Ergocalciferol, (DRISDOL) 1.25 MG (50000 UNIT) CAPS capsule Take 50,000 Units by mouth 2 (two) times a week.     metoprolol tartrate (LOPRESSOR) 25 MG tablet Take 0.5 tablets (12.5 mg total) by mouth 2 (two) times  daily. 30 tablet 0   No current facility-administered medications for this visit.     ROS:  See HPI  Physical Exam: Fistula duplex:    Findings:  +--------------------+----------+-----------------+--------+  AVF                 PSV (cm/s)Flow Vol (mL/min)Comments  +--------------------+----------+-----------------+--------+  Native artery inflow   312          3009                 +--------------------+----------+-----------------+--------+  AVF Anastomosis        734                               +--------------------+----------+-----------------+--------+      +------------+----------+-------------+----------+--------------+  OUTFLOW VEINPSV (cm/s)Diameter (cm)Depth (cm)   Describe     +------------+----------+-------------+----------+--------------+  Prox UA        154        1.48        0.38                   +------------+----------+-------------+----------+--------------+  Mid UA         196        1.45        0.62                   +------------+----------+-------------+----------+--------------+  Dist UA        797        1.03        0.29   Retained valve  +------------+----------+-------------+----------+--------------+  AC Fossa       520        0.69        0.94                   +------------+----------+-------------+----------+--------------+    Summary:  Patent arteriovenous fistula with increased velocities observed at  retained  valve site (approximately 1.75 cm from anastomosis).    Incision:  The left UE incisions have healed well Extremities:  there is an excellent  thrill in the fistula Neuro: Sensation and  motor intact and equal B UE    Assessment/Plan:  This is a 45 y.o. male who is s/p:Left second stage basilic av fistula by Dr. Oneida Alar on 08/06/20.  The HD center is have problems accessing the fistula.  It has an excellent thrill Throughout its course.  The attempted stick sites seem higher/above the  actual  fistula.    Fistula may be used.  The duplex shows its> 1.0 cm in diameter and the depth is < .4 most of it's course.  F/U as needed.      Roxy Horseman PA-C [facility] [phone]   Clinic MD:  [facility]

## 2020-10-18 ENCOUNTER — Other Ambulatory Visit: Payer: Self-pay | Admitting: Physician Assistant

## 2020-10-20 ENCOUNTER — Other Ambulatory Visit: Payer: Self-pay | Admitting: Physical Medicine and Rehabilitation

## 2020-10-28 ENCOUNTER — Ambulatory Visit (INDEPENDENT_AMBULATORY_CARE_PROVIDER_SITE_OTHER): Payer: Medicare Other

## 2020-10-28 DIAGNOSIS — I639 Cerebral infarction, unspecified: Secondary | ICD-10-CM

## 2020-10-28 LAB — CUP PACEART REMOTE DEVICE CHECK
Date Time Interrogation Session: 20221001202059
Implantable Pulse Generator Implant Date: 20210429

## 2020-10-29 ENCOUNTER — Ambulatory Visit: Payer: Medicare Other | Admitting: Adult Health

## 2020-11-04 NOTE — Progress Notes (Signed)
[facility] Summary Report / Loop Recorder 

## 2020-11-18 ENCOUNTER — Other Ambulatory Visit: Payer: Self-pay

## 2020-11-18 ENCOUNTER — Telehealth: Payer: Self-pay

## 2020-11-18 NOTE — Telephone Encounter (Signed)
Ramiro Harvest PA for the renal group in [place] called today that they are still having difficulty cannulating patient and he is running high venous pressures since the second stage BVT in July. The last two HD sessions they have had to use TDC. They were also having difficulty on 9/22 when patient was seen in office with a duplex that showed no issues with fistula. They are requesting a fistulogram to further evaluate access. Discussed case with Dr. Trula Slade -including duplex and previous difficulty with cannulation. He has approved scheduling a LUE fistulogram to determine if there are any new issues not previously detected. Patient agreeable to plan and is scheduled with [facility] for fistulogram next week.

## 2020-11-20 NOTE — Progress Notes (Signed)
Cardiology Office Note  Date: 11/21/2020   ID: Max Pro., DOB [birth year], MRN [id number]  PCP:  Chesley Noon, MD  Cardiologist:  None Electrophysiologist:  None   Chief Complaint: Coronary artery vasospasm  History of Present Illness: Max Paras. is a 45 y.o. 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  Hospitalized February 2021 secondary to multiple episodes of chest pain and syncope.  Troponins peaked at 3491.  Underwent cardiac catheterization.  No focal lesions and felt symptoms were related to coronary artery vasospasm.  Initially started on Imdur which led to headaches.  Imdur was stopped and he was started on Ranexa 500 mg p.o. twice daily.  However later he had stopped the medication.  Had no evidence of arrhythmias and echo demonstrated normal LV systolic function.  Last visit with Dr. Bronson Ing 05/12/2019 via telemedicine visit.  Blood pressure was elevated  and he was started on amlodipine 2.5 mg daily.  He was continuing atorvastatin.  He was continuing peritoneal dialysis due to end-stage renal disease.  He had an admission on March 07, 2020 with presentation of shortness of breath and on arrival to ED he had a cardiac arrest with 12 minutes of CPR before ROSC admitted to ICU and intubated.  He later self extubated was transferred to hospitalist service and requiring CRRT in ICU.  He was transitioned to hemodialysis.  Admission diagnosis was acute respiratory failure with hypoxia.  History of end-stage renal disease on peritoneal dialysis with membrane failure leading to hyperkalemia and hypomagnesemia.  He had a spontaneous retroperitoneal bleed / iliopsoas hematoma.  His PD catheter was removed.  A tunneled catheter was placed and he was started on dialysis which he tolerated well.  He had multiple other issues  during hospitalization including anemia requiring 1 unit of packed red blood cells.  He was continuing amlodipine and metoprolol for hypertension.  He was continuing on medical control and Keppra for history of CVA and seizures.  He was deconditioned and referred to physical therapy who recommended inpatient rehab.  His acute metabolic encephalopathy was likely due to respiratory distress from uremia.  He was not compliant with his CPAP.  His cardiogenic shock resolved.  Due to his spontaneous retroperitoneal bleed Coumadin and aspirin were held.    He is here today for 54-month follow-up.  He denies any recent issues.  He states he is going to have a revision to his AV fistula in the near future.  Currently has a tunneled catheter in his right upper chest for dialysis access.  He denies any recent anginal or exertional symptoms, palpitations or arrhythmias, orthostatic symptoms, CVA or TIA-like symptoms, PND, orthopnea, SOB or DOE.  No bleeding issues.  No claudication-like symptoms, no DVT or PE-like symptoms, or lower extremity edema.  He states he is basically wheelchair-bound and has problems with circulation in his legs.  He denies any current claudication-like symptoms.  Blood pressure is well controlled.   Past Medical History:  Diagnosis Date   Anxiety    Arthritis    Bipolar disorder ([place])    Closed dislocation of left hip ([place]) 05/21/2019   Diabetes ([place])    Diabetes mellitus without complication (HCC)    no meds, type 2   DVT (deep venous thrombosis) (HCC)    Fibromyalgia    History of blood transfusion  w/surgery   History of peritoneal dialysis    HLD (hyperlipidemia)    on lipitor   Hypertension    Peripheral vascular disease ([place])    Renal disorder    FFGS - dialysis mon wed fri   Renal disorder    Seizure ([place]) 08/04/2020   "SILENT SEIZURE" PER PATIENT   Sleep apnea    does not use cpap   Stroke [facility])    when ha was a child and as adult 03/07/20   Vasovagal syndrome     with syncope    Past Surgical History:  Procedure Laterality Date   AMPUTATION Right 07/21/2019   Procedure: Right index finger amputation as necessary at distal interphalangeal joint;  Surgeon: Roseanne K[place]man, MD;  Location: [place];  Service: Orthopedics;  Laterality: Right;  60 mins   APPLICATION OF WOUND VAC Left 05/22/2019   Procedure: APPLICATION OF WOUND VAC  LEFT HIP;  Surgeon: Altamese Ludington, MD;  Location: [place];  Service: Orthopedics;  Laterality: Left;   APPLICATION OF WOUND VAC Left 07/17/2019   Procedure: WOUND VAC CHANGE;  Surgeon: Altamese Chauncey, MD;  Location: [place];  Service: Orthopedics;  Laterality: Left;   AV FISTULA INSERTION W/ RF MAGNETIC GUIDANCE Left    AV FISTULA PLACEMENT     AV FISTULA PLACEMENT Left 04/08/2020   Procedure: ARTERIOVENOUS (AV) FISTULA CREATION;  Surgeon: Angelia Mould, MD;  Location: [place];  Service: Vascular;  Laterality: Left;   [place] Left 08/06/2020   Procedure: LEFT ARM SECOND STAGE [place];  Surgeon: Elam Dutch, MD;  Location: [place];  Service: Vascular;  Laterality: Left;   BUBBLE STUDY  05/25/2019   Procedure: BUBBLE STUDY;  Surgeon: Dorothy Spark, MD;  Location: [place];  Service: Cardiovascular;;   CHOLECYSTECTOMY     HERNIA REPAIR     HIP CLOSED REDUCTION Left 05/20/2019   Procedure: CLOSED REDUCTION HIP WITH TRACTION PIN APPLICATION;  Surgeon: Altamese Orwell, MD;  Location: [place];  Service: Orthopedics;  Laterality: Left;   INCISION AND DRAINAGE HIP Left 07/14/2019   Procedure: IRRIGATION AND DEBRIDEMENT HIP;  Surgeon: Altamese Woodhull, MD;  Location: [place];  Service: Orthopedics;  Laterality: Left;   INCISION AND DRAINAGE HIP Left 07/17/2019   Procedure: REPEAT IRRIGATION AND DEBRIDEMENT LEFT HIP;  Surgeon: Altamese Gleed, MD;  Location: [place];  Service: Orthopedics;  Laterality: Left;   IR FLUORO GUIDE CV LINE RIGHT  05/23/2019   IR FLUORO GUIDE CV LINE RIGHT   07/21/2019   IR PERC TUN PERIT CATH WO PORT S&I /IMAG  03/20/2020   IR REMOVAL TUN CV CATH W/O FL  08/29/2019   IR US GUIDE VASC ACCESS RIGHT  05/23/2019   IR US GUIDE VASC ACCESS RIGHT  07/21/2019   IR US GUIDE VASC ACCESS RIGHT  03/20/2020   JOINT REPLACEMENT     LEFT HEART CATH AND CORONARY ANGIOGRAPHY N/A 03/22/2019   Procedure: LEFT HEART CATH AND CORONARY ANGIOGRAPHY;  Surgeon: Wellington Hampshire, MD;  Location: [place] CV LAB;  Service: Cardiovascular;  Laterality: N/A;   LOOP RECORDER INSERTION N/A 05/25/2019   Procedure: LOOP RECORDER INSERTION;  Surgeon: [name], MD;  Location: [place] CV LAB;  Service: Cardiovascular;  Laterality: N/A;   MINOR REMOVAL OF PERITONEAL DIALYSIS CATHETER N/A 03/22/2020   Procedure: REMOVAL OF PERITONEAL DIALYSIS CATHETER;  Surgeon: Kieth Brightly, Arta Bruce, MD;  Location: [place];  Service: General;  Laterality: N/A;  ROOM 1 STATING AT 11:00AM  FOR 60 MIN   ORIF ACETABULAR FRACTURE Left 05/22/2019   Procedure: OPEN REDUCTION INTERNAL FIXATION (ORIF) T TYPE  WITH ASSOCIATED POSTERIOR WALL ACETABULAR FRACTURE, LEFT; REMOVAL OF TRACTION PIN LEFT TIBIA;  Surgeon: Altamese Westmoreland, MD;  Location: [place];  Service: Orthopedics;  Laterality: Left;   REMOVAL OF A DIALYSIS CATHETER Right 07/17/2019   Procedure: REMOVAL OF HEMODIALYSIS DIALYSIS CATHETER;  Surgeon: Altamese Lone Wolf, MD;  Location: [place];  Service: Orthopedics;  Laterality: Right;   TEE WITHOUT CARDIOVERSION N/A 05/25/2019   Procedure: TRANSESOPHAGEAL ECHOCARDIOGRAM (TEE);  Surgeon: Dorothy Spark, MD;  Location: [place];  Service: Cardiovascular;  Laterality: N/A;   TEE WITHOUT CARDIOVERSION N/A 07/19/2019   Procedure: TRANSESOPHAGEAL ECHOCARDIOGRAM (TEE);  Surgeon: Sueanne Margarita, MD;  Location[facility] ENDOSCOPY;  Service: Cardiovascular;  Laterality: N/A;   TONSILLECTOMY      Current Outpatient Medications  Medication Sig Dispense Refill   amitriptyline (ELAVIL) 75 MG tablet TAKE 1  TABLET BY MOUTH AT BEDTIME. 30 tablet 1   atorvastatin (LIPITOR) 40 MG tablet TAKE 1 TABLET BY MOUTH DAILY AT 6 PM. 31 tablet 0   baclofen (LIORESAL) 10 MG tablet baclofen 10 mg tablet  TAKE ONE TABLET (10 MG DOSE) BY MOUTH 3 (THREE) TIMES A DAY FOR 30 DAYS.     Blood Glucose Monitoring Suppl (GLUCOCOM BLOOD GLUCOSE MONITOR) DEVI Use to check blood sugar daily. E11.40     buPROPion (WELLBUTRIN SR) 150 MG 12 hr tablet Take 1 tablet (150 mg total) by mouth 2 (two) times daily with a meal. 60 tablet 0   cephALEXin (KEFLEX) 250 MG capsule Take 1 capsule (250 mg total) by mouth every 12 (twelve) hours. 60 capsule 11   Cholecalciferol 25 MCG (1000 UT) tablet cholecalciferol (vitamin D3) 25 mcg (1,000 unit) tablet   2000 units by oral route.     [employer] ANTI-ITCH lotion APPLY TOPICALLY AS NEEDED FOR ITCHING. 222 mL 0   docusate sodium (COLACE) 100 MG capsule TAKE 1 CAPSULE BY MOUTH TWICE A DAY 60 capsule 0   gabapentin (NEURONTIN) 300 MG capsule TAKE 1 CAPSULE BY MOUTH THREE TIMES A DAY 90 capsule 2   hydrALAZINE (APRESOLINE) 25 MG tablet Take 1 tablet by mouth 3 (three) times daily.     isosorbide mononitrate (IMDUR) 30 MG 24 hr tablet Take 0.5 tablets (15 mg total) by mouth daily. 15 tablet 0   lamoTRIgine (LAMICTAL) 100 MG tablet TAKE 1 TABLET BY MOUTH TWICE A DAY 60 tablet 11   levETIRAcetam (KEPPRA) 500 MG tablet Take 1 tablet (500 mg total) by mouth daily. (Patient taking differently: Take 500 mg by mouth at bedtime.) 90 tablet 3   loratadine (CLARITIN) 10 MG tablet Take 1 tablet (10 mg total) by mouth daily. (Patient taking differently: Take 10 mg by mouth daily as needed.) 30 tablet 0   meclizine (ANTIVERT) 25 MG tablet TAKE 1 TABLET BY MOUTH THREE TIMES A DAY 90 tablet 0   melatonin 5 MG TABS Take 1 tablet (5 mg total) by mouth at bedtime. 30 tablet 0   metoprolol tartrate (LOPRESSOR) 25 MG tablet Take 0.5 tablets (12.5 mg total) by mouth 2 (two) times daily. 30 tablet 0   multivitamin (RENA-VIT)  TABS tablet Take 1 tablet by mouth at bedtime. 30 tablet 0   oxyCODONE-acetaminophen (PERCOCET) 7.5-325 MG tablet Take 1 tablet by mouth every 8 (eight) hours as needed.     pantoprazole (PROTONIX) 40 MG tablet TAKE 1 TABLET BY MOUTH EVERY DAY 30 tablet  0   polyethylene glycol (MIRALAX / GLYCOLAX) 17 g packet Take 17 g by mouth daily. (Patient taking differently: Take 17 g by mouth daily as needed.) 30 each 0   ranolazine (RANEXA) 500 MG 12 hr tablet Take 1 tablet (500 mg total) by mouth 2 (two) times daily. Please make yearly appt with Cardiologist for October 2022 for future refills. Thank you 1st attempt 60 tablet 1   sevelamer carbonate (RENVELA) 800 MG tablet Take 2 tablets (1,600 mg total) by mouth 3 (three) times daily with meals. 240 tablet 0   Vitamin D, Ergocalciferol, (DRISDOL) 1.25 MG (50000 UNIT) CAPS capsule Take 50,000 Units by mouth 2 (two) times a week.     No current facility-administered medications for this visit.   Allergies:  Doxycycline, Latex, and Morphine   Social History: The patient  reports that he quit smoking about 2 years ago. His smoking use included cigarettes. He has never used smokeless tobacco. He reports that he does not currently use alcohol. He reports that he does not use drugs.   Family History: The patient's family history includes CAD in his mother; Diabetes in his father and mother; Healthy in his brother; Heart disease in his mother; Hypercholesterolemia in his father and mother; Hypertension in his father and mother.   ROS:  Please see the history of present illness. Otherwise, complete review of systems is positive for none.  All other systems are reviewed and negative.   Physical Exam: VS:  BP 128/82   Ht 5\' 9"  (1.753 m)   Wt 230 lb 6.1 oz (104.5 kg)   BMI 34.02 kg/m , BMI Body mass index is 34.02 kg/m.  Wt Readings from Last 3 Encounters:  11/21/20 230 lb 6.1 oz (104.5 kg)  10/17/20 230 lb 6.1 oz (104.5 kg)  08/06/20 235 lb 0.2 oz (106.6  kg)    General: obese patient appears comfortable at rest. Neck: Supple, no elevated JVP or carotid bruits, no thyromegaly. Lungs: Clear to auscultation, nonlabored breathing at rest. Cardiac: Regular rate and rhythm, no S3 or significant systolic murmur, no pericardial rub. Extremities: No pitting edema, distal pulses 2+. Skin: Warm and dry. Musculoskeletal: No kyphosis. Neuropsychiatric: Alert and oriented x3, affect grossly appropriate.  ECG:  EKG 07/13/2019: Sinus tachycardia rate of 105, anteroseptal infarct, age undetermined, prolonged QT interval with QT 393 QTC 520.  Recent Labwork: 03/19/2020: Magnesium 2.4 03/23/2020: ALT 5; AST 19 04/05/2020: Platelets 278 08/06/2020: BUN 26; Creatinine, Ser 9.30; Hemoglobin 13.6; Potassium 4.1; Sodium 142   Recent lab work from 08/07/2019: Creatinine 7.04, GFR 9, potassium 2.8, hemoglobin 10.1, hematocrit 33,    Component Value Date/Time   CHOL 82 05/22/2019 0134   TRIG 208 (H) 03/11/2020 0347   HDL 25 (L) 05/22/2019 0134   CHOLHDL 3.3 05/22/2019 0134   VLDL 23 05/22/2019 0134   LDLCALC 34 05/22/2019 0134    Other Studies Reviewed Today:  Cath: 03/22/19   Mid LAD lesion is 20% stenosed. The left ventricular systolic function is normal. LV end diastolic pressure is mildly elevated. Prox RCA lesion is 40% stenosed.   1.  Moderate one-vessel coronary artery disease involving the proximal right coronary artery.  Significant spasm noted in the ostial proximal RCA improved significantly with intracoronary nitroglycerin.  No other obstructive disease noted. 2.  Normal LV systolic function and mildly elevated left ventricular end-diastolic pressure.   Recommendations: No clear culprit is identified for non-ST elevation myocardial infarction.  Right coronary spasm is a possibility.  Treating this  might be difficult given the patient's recurrent syncope and hypotension.  I am going to add small dose Imdur 15 mg daily. Continue aggressive  treatment of risk factors.    TTE: 03/22/19  IMPRESSIONS   1. Left ventricular ejection fraction, by estimation, is 60 to 65%. The  left ventricle has normal function. The left ventricle has no regional  wall motion abnormalities. Indeterminate diastolic filling due to E-A  fusion.   2. Right ventricular systolic function is normal. The right ventricular  size is normal. Tricuspid regurgitation signal is inadequate for assessing  PA pressure.   3. The mitral valve is grossly normal. Trivial mitral valve  regurgitation. No evidence of mitral stenosis.   4. The aortic valve is grossly normal. Aortic valve regurgitation is not  visualized. No aortic stenosis is present.       Assessment and Plan:  1. CAD in native artery   2. Coronary artery vasospasm (HCC)   3. Essential hypertension   4. Mixed hyperlipidemia   5. ESRD (end stage renal disease) ([place])     1. CAD in native artery Denies any anginal or exertional symptoms.Cardiac cath 03/22/2019 showed mid LAD lesion 20% stenosis.  Proximal RCA lesion 40% stenosis on normal LV systolic function, LV end-diastolic pressure mildly elevated.  Continue aspirin 81 mg p.o. daily.  Continue Imdur 15 mg p.o. daily.  Continue metoprolol 12.5 mg p.o. twice daily.  Continue Ranexa 500 mg p.o. twice daily.  2. Coronary artery vasospasm (HCC) No recent chest pain or symptoms of coronary artery vasospasm.  Continue amlodipine 5 mg p.o. daily.  Continue Imdur 15 mg p.o. daily.  Continue Ranexa 500 mg p.o. twice daily.  3. Essential hypertension  BP today is 128 over BP today 128/82.  Continue metoprolol 12.5 mg p.o. twice daily.  Amlodipine 5 mg p.o. daily.  Continue hydralazine 25 mg p.o. 3 times daily  4. Mixed hyperlipidemia Continue atorvastatin 40 mg p.o. daily.  Recent lipid panel 05/22/2019: TC 82, TG 117, HDL 25, LDL 34.  Medication Adjustments/Labs and Tests Ordered: Current medicines are reviewed at length with the patient today.  Concerns  regarding medicines are outlined above.   Disposition: Follow-up with Dr. Domenic Polite or APP 6 months.  Signed, Levell July, NP 11/21/2020 10:08 AM    [facility] [place], [place], [place] [address] Phone: [phone]; Fax: [phone]

## 2020-11-21 ENCOUNTER — Other Ambulatory Visit: Payer: Self-pay

## 2020-11-21 ENCOUNTER — Encounter: Payer: Self-pay | Admitting: Family Medicine

## 2020-11-21 ENCOUNTER — Ambulatory Visit (INDEPENDENT_AMBULATORY_CARE_PROVIDER_SITE_OTHER): Payer: Medicare Other | Admitting: Family Medicine

## 2020-11-21 VITALS — BP 128/82 | Ht 69.0 in | Wt 230.4 lb

## 2020-11-21 DIAGNOSIS — N186 End stage renal disease: Secondary | ICD-10-CM

## 2020-11-21 DIAGNOSIS — I10 Essential (primary) hypertension: Secondary | ICD-10-CM

## 2020-11-21 DIAGNOSIS — I2510 Atherosclerotic heart disease of native coronary artery without angina pectoris: Secondary | ICD-10-CM

## 2020-11-21 DIAGNOSIS — I201 Angina pectoris with documented spasm: Secondary | ICD-10-CM

## 2020-11-21 DIAGNOSIS — E782 Mixed hyperlipidemia: Secondary | ICD-10-CM

## 2020-11-21 NOTE — Patient Instructions (Signed)

## 2020-11-26 ENCOUNTER — Encounter (HOSPITAL_COMMUNITY)
Admission: RE | Disposition: A | Discharge status: Home / Self Care | Payer: Self-pay | Source: Ambulatory Visit | Attending: Surgery

## 2020-11-26 ENCOUNTER — Encounter (HOSPITAL_COMMUNITY): Payer: Self-pay | Admitting: Surgery

## 2020-11-26 ENCOUNTER — Ambulatory Visit (HOSPITAL_COMMUNITY)
Admission: RE | Admit: 2020-11-26 | Discharge: 2020-11-26 | Disposition: A | Discharge status: Home / Self Care | Payer: Medicare Other | Source: Ambulatory Visit | Attending: Surgery | Admitting: Surgery

## 2020-11-26 ENCOUNTER — Other Ambulatory Visit: Payer: Self-pay

## 2020-11-26 DIAGNOSIS — N186 End stage renal disease: Secondary | ICD-10-CM | POA: Insufficient documentation

## 2020-11-26 DIAGNOSIS — T82898A Other specified complication of vascular prosthetic devices, implants and grafts, initial encounter: Secondary | ICD-10-CM | POA: Insufficient documentation

## 2020-11-26 DIAGNOSIS — Z992 Dependence on renal dialysis: Secondary | ICD-10-CM | POA: Diagnosis not present

## 2020-11-26 DIAGNOSIS — Y841 Kidney dialysis as the cause of abnormal reaction of the patient, or of later complication, without mention of misadventure at the time of the procedure: Secondary | ICD-10-CM | POA: Insufficient documentation

## 2020-11-26 HISTORY — PX: A/V FISTULAGRAM: CATH118298

## 2020-11-26 LAB — GLUCOSE, CAPILLARY
Glucose-Capillary: 66 mg/dL — ABNORMAL LOW (ref 70–99)
Glucose-Capillary: 93 mg/dL (ref 70–99)
Glucose-Capillary: 58 mg/dL — ABNORMAL LOW (ref 70–99)

## 2020-11-26 LAB — POCT I-STAT, CHEM 8
BUN: 42 mg/dL — ABNORMAL HIGH (ref 6–20)
Calcium, Ion: 1.19 mmol/L (ref 1.15–1.40)
Chloride: 103 mmol/L (ref 98–111)
Creatinine, Ser: 13.1 mg/dL — ABNORMAL HIGH (ref 0.61–1.24)
Glucose, Bld: 69 mg/dL — ABNORMAL LOW (ref 70–99)
HCT: 39 % (ref 39.0–52.0)
Hemoglobin: 13.3 g/dL (ref 13.0–17.0)
Potassium: 4.6 mmol/L (ref 3.5–5.1)
Sodium: 140 mmol/L (ref 135–145)
TCO2: 23 mmol/L (ref 22–32)

## 2020-11-26 SURGERY — A/V FISTULAGRAM
Anesthesia: LOCAL | Laterality: Left

## 2020-11-26 MED ORDER — FENTANYL CITRATE (PF) 100 MCG/2ML IJ SOLN
INTRAMUSCULAR | Status: DC | PRN
  Administered 2020-11-26: 25 ug via INTRAVENOUS

## 2020-11-26 MED ORDER — SODIUM CHLORIDE 0.9% FLUSH: 3.0000 mL | INTRAVENOUS | Status: DC | PRN

## 2020-11-26 MED ORDER — DEXTROSE 50 % IV SOLN: 12.5000 g | INTRAVENOUS | Status: DC

## 2020-11-26 MED ORDER — LIDOCAINE HCL (PF) 1 % IJ SOLN
INTRAMUSCULAR | Status: AC
  Filled 2020-11-26: qty 30

## 2020-11-26 MED ORDER — SODIUM CHLORIDE 0.9 % IV SOLN: 250.0000 mL | INTRAVENOUS | Status: DC | PRN

## 2020-11-26 MED ORDER — IODIXANOL 320 MG/ML IV SOLN
INTRAVENOUS | Status: DC | PRN
  Administered 2020-11-26: 40 mL

## 2020-11-26 MED ORDER — SODIUM CHLORIDE 0.9% FLUSH: 3.0000 mL | Freq: Two times a day (BID) | INTRAVENOUS | Status: DC

## 2020-11-26 MED ORDER — MIDAZOLAM HCL 2 MG/2ML IJ SOLN
INTRAMUSCULAR | Status: DC | PRN
  Administered 2020-11-26: 1 mg via INTRAVENOUS

## 2020-11-26 MED ORDER — MIDAZOLAM HCL 2 MG/2ML IJ SOLN
INTRAMUSCULAR | Status: AC
  Filled 2020-11-26: qty 2

## 2020-11-26 MED ORDER — DEXTROSE 50 % IV SOLN
INTRAVENOUS | Status: AC
  Administered 2020-11-26: 25 mL
  Filled 2020-11-26: qty 50

## 2020-11-26 MED ORDER — HEPARIN (PORCINE) IN NACL 1000-0.9 UT/500ML-% IV SOLN
INTRAVENOUS | Status: DC | PRN
  Administered 2020-11-26: 500 mL

## 2020-11-26 MED ORDER — LIDOCAINE HCL (PF) 1 % IJ SOLN
INTRAMUSCULAR | Status: DC | PRN
  Administered 2020-11-26: 2 mL

## 2020-11-26 MED ORDER — HEPARIN (PORCINE) IN NACL 1000-0.9 UT/500ML-% IV SOLN
INTRAVENOUS | Status: AC
  Filled 2020-11-26: qty 1000

## 2020-11-26 MED ORDER — FENTANYL CITRATE (PF) 100 MCG/2ML IJ SOLN
INTRAMUSCULAR | Status: AC
  Filled 2020-11-26: qty 2

## 2020-11-26 SURGICAL SUPPLY — 9 items
BAG SNAP BAND KOVER 36X36 (MISCELLANEOUS) ×2 IMPLANT
COVER DOME SNAP 22 D (MISCELLANEOUS) ×2 IMPLANT
KIT MICROPUNCTURE NIT STIFF (SHEATH) ×2 IMPLANT
PROTECTION STATION PRESSURIZED (MISCELLANEOUS) ×2
SHEATH PROBE COVER 6X72 (BAG) ×4 IMPLANT
STATION PROTECTION PRESSURIZED (MISCELLANEOUS) ×1 IMPLANT
STOPCOCK MORSE 400PSI 3WAY (MISCELLANEOUS) ×2 IMPLANT
TRAY PV CATH (CUSTOM PROCEDURE TRAY) ×2 IMPLANT
TUBING CIL FLEX 10 FLL-RA (TUBING) ×2 IMPLANT

## 2020-11-26 NOTE — Op Note (Addendum)
    Patient name: Max Pittman. MRN: [id number] DOB: 18-Jan-1976 Sex: male  11/26/2020 Pre-operative Diagnosis: End-stage renal disease Post-operative diagnosis:  Same Surgeon:  Annamarie Major Procedure Performed:  1.  Ultrasound-guided access, left basilic vein  2.  Fistulogram  3.  Conscious sedation, 18 minutes    Indications: The patient has previously undergone a basilic vein fistula creation which has been difficult to cannulate.  He is here for fistulogram.  Procedure:  The patient was identified in the holding area and taken to room 8.  The patient was then placed supine on the table and prepped and draped in the usual sterile fashion.  A time out was called.  Conscious sedation was administered with the use of IV fentanyl and Versed under continuous physician and nurse monitoring.  Heart rate, blood pressure, and oxygen saturations were continuously monitored.  Total sedation time was 18 minutes ultrasound was used to evaluate the fistula.  The vein was patent and compressible.  A digital ultrasound image was acquired.  The fistula was then accessed under ultrasound guidance using a micropuncture needle.  An 018 wire was then asvanced without resistance and a micropuncture sheath was placed.  Contrast injections were then performed through the sheath.  Findings: The central venous system is widely patent.  The arterial venous anastomosis is widely patent.  No stenosis is identified within the basilic vein fistula.  There is a large collateral up in the axilla that feeds into the central venous system.  Manual pressure was held for hemostasis.   Intervention: None  Impression:  #1  No central venous stenosis or anastomotic stenosis.  The fistula is widely patent  #2  There is a large branch originating at the midportion of the proximal incision in the axilla.  I have recommended ligation of this branch and elevation of the fistula in the operating room in the near future.    Theotis Burrow, M.D., [facility] [place] Office: [phone] Pager:  [phone]

## 2020-11-26 NOTE — H&P (Signed)
   Patient name: Max Pittman. MRN: [id number] DOB: [birth year] Sex: male   HISTORY OF PRESENT ILLNESS:   Max Pittman. is a 45 y.o. male who had a basilic vein fistula created on February 07, 2020 by Dr. Oneida Alar.  He has been having trouble with access.  He is here for fistulogram  CURRENT MEDICATIONS:    Current Facility-Administered Medications  Medication Dose Route Frequency Provider Last Rate Last Admin   0.9 %  sodium chloride infusion  250 mL Intravenous PRN Serafina Mitchell, MD       dextrose 50 % solution 12.5 g  12.5 g Intravenous STAT Serafina Mitchell, MD       sodium chloride flush (NS) 0.9 % injection 3 mL  3 mL Intravenous Q12H Serafina Mitchell, MD       sodium chloride flush (NS) 0.9 % injection 3 mL  3 mL Intravenous PRN Serafina Mitchell, MD        REVIEW OF SYSTEMS:   [X]  denotes positive finding, [ ]  denotes negative finding Cardiac  Comments:  Chest pain or chest pressure:    Shortness of breath upon exertion:    Short of breath when lying flat:    Irregular heart rhythm:    Constitutional    Fever or chills:      PHYSICAL EXAM:   Vitals:   11/26/20 0645  BP: (!) 161/92  Pulse: 91  Resp: 16  Temp: 98 F (36.7 C)  TempSrc: Oral  SpO2: 96%  Weight: 114.5 kg  Height: 5\' 9"  (1.753 m)    GENERAL: The patient is a well-nourished male, in no acute distress. The vital signs are documented above. CARDIOVASCULAR: There is a regular rate and rhythm. PULMONARY: Non-labored respirations   STUDIES:   None   MEDICAL ISSUES:   Poorly functioning left basilic vein fistula.  Plan for fistulogram with possible intervention  Leia Alf, MD, FACS [facility] [phone] Pager [phone]

## 2020-11-27 ENCOUNTER — Other Ambulatory Visit: Payer: Self-pay

## 2020-12-02 ENCOUNTER — Ambulatory Visit (INDEPENDENT_AMBULATORY_CARE_PROVIDER_SITE_OTHER): Payer: Medicare Other

## 2020-12-02 DIAGNOSIS — I469 Cardiac arrest, cause unspecified: Secondary | ICD-10-CM

## 2020-12-02 LAB — CUP PACEART REMOTE DEVICE CHECK
Date Time Interrogation Session: 20221103202122
Implantable Pulse Generator Implant Date: 20210429

## 2020-12-06 NOTE — Progress Notes (Signed)
[facility] Summary Report / Loop Recorder 

## 2020-12-09 ENCOUNTER — Other Ambulatory Visit: Payer: Self-pay

## 2020-12-09 ENCOUNTER — Encounter (HOSPITAL_COMMUNITY): Payer: Self-pay

## 2020-12-09 ENCOUNTER — Inpatient Hospital Stay (HOSPITAL_COMMUNITY)
Admission: EM | Admit: 2020-12-09 | Discharge: 2020-12-13 | DRG: 100 | Disposition: A | Discharge status: Home Health | Payer: Medicare Other | Attending: Internal Medicine | Admitting: Internal Medicine

## 2020-12-09 ENCOUNTER — Emergency Department (HOSPITAL_COMMUNITY): Payer: Medicare Other

## 2020-12-09 DIAGNOSIS — G4733 Obstructive sleep apnea (adult) (pediatric): Secondary | ICD-10-CM | POA: Diagnosis present

## 2020-12-09 DIAGNOSIS — R569 Unspecified convulsions: Principal | ICD-10-CM

## 2020-12-09 DIAGNOSIS — M797 Fibromyalgia: Secondary | ICD-10-CM | POA: Diagnosis present

## 2020-12-09 DIAGNOSIS — I2510 Atherosclerotic heart disease of native coronary artery without angina pectoris: Secondary | ICD-10-CM | POA: Diagnosis present

## 2020-12-09 DIAGNOSIS — Z91040 Latex allergy status: Secondary | ICD-10-CM

## 2020-12-09 DIAGNOSIS — G9341 Metabolic encephalopathy: Secondary | ICD-10-CM | POA: Diagnosis present

## 2020-12-09 DIAGNOSIS — Z8619 Personal history of other infectious and parasitic diseases: Secondary | ICD-10-CM

## 2020-12-09 DIAGNOSIS — G40A09 Absence epileptic syndrome, not intractable, without status epilepticus: Secondary | ICD-10-CM | POA: Diagnosis not present

## 2020-12-09 DIAGNOSIS — Z86718 Personal history of other venous thrombosis and embolism: Secondary | ICD-10-CM

## 2020-12-09 DIAGNOSIS — F22 Delusional disorders: Secondary | ICD-10-CM | POA: Diagnosis present

## 2020-12-09 DIAGNOSIS — Z20822 Contact with and (suspected) exposure to covid-19: Secondary | ICD-10-CM | POA: Diagnosis present

## 2020-12-09 DIAGNOSIS — I82409 Acute embolism and thrombosis of unspecified deep veins of unspecified lower extremity: Secondary | ICD-10-CM | POA: Diagnosis present

## 2020-12-09 DIAGNOSIS — N2581 Secondary hyperparathyroidism of renal origin: Secondary | ICD-10-CM | POA: Diagnosis present

## 2020-12-09 DIAGNOSIS — Z792 Long term (current) use of antibiotics: Secondary | ICD-10-CM

## 2020-12-09 DIAGNOSIS — D631 Anemia in chronic kidney disease: Secondary | ICD-10-CM | POA: Diagnosis present

## 2020-12-09 DIAGNOSIS — M1990 Unspecified osteoarthritis, unspecified site: Secondary | ICD-10-CM | POA: Diagnosis present

## 2020-12-09 DIAGNOSIS — Z833 Family history of diabetes mellitus: Secondary | ICD-10-CM

## 2020-12-09 DIAGNOSIS — Z6833 Body mass index (BMI) 33.0-33.9, adult: Secondary | ICD-10-CM

## 2020-12-09 DIAGNOSIS — Z8249 Family history of ischemic heart disease and other diseases of the circulatory system: Secondary | ICD-10-CM

## 2020-12-09 DIAGNOSIS — M898X9 Other specified disorders of bone, unspecified site: Secondary | ICD-10-CM | POA: Diagnosis present

## 2020-12-09 DIAGNOSIS — Z881 Allergy status to other antibiotic agents status: Secondary | ICD-10-CM

## 2020-12-09 DIAGNOSIS — Z992 Dependence on renal dialysis: Secondary | ICD-10-CM

## 2020-12-09 DIAGNOSIS — N186 End stage renal disease: Secondary | ICD-10-CM | POA: Diagnosis present

## 2020-12-09 DIAGNOSIS — E1122 Type 2 diabetes mellitus with diabetic chronic kidney disease: Secondary | ICD-10-CM | POA: Diagnosis present

## 2020-12-09 DIAGNOSIS — Z83438 Family history of other disorder of lipoprotein metabolism and other lipidemia: Secondary | ICD-10-CM

## 2020-12-09 DIAGNOSIS — F319 Bipolar disorder, unspecified: Secondary | ICD-10-CM | POA: Diagnosis present

## 2020-12-09 DIAGNOSIS — I10 Essential (primary) hypertension: Secondary | ICD-10-CM | POA: Diagnosis present

## 2020-12-09 DIAGNOSIS — Z87891 Personal history of nicotine dependence: Secondary | ICD-10-CM

## 2020-12-09 DIAGNOSIS — Z79899 Other long term (current) drug therapy: Secondary | ICD-10-CM

## 2020-12-09 DIAGNOSIS — E1159 Type 2 diabetes mellitus with other circulatory complications: Secondary | ICD-10-CM

## 2020-12-09 DIAGNOSIS — E119 Type 2 diabetes mellitus without complications: Secondary | ICD-10-CM

## 2020-12-09 DIAGNOSIS — G9340 Encephalopathy, unspecified: Secondary | ICD-10-CM | POA: Diagnosis present

## 2020-12-09 DIAGNOSIS — F419 Anxiety disorder, unspecified: Secondary | ICD-10-CM | POA: Diagnosis present

## 2020-12-09 DIAGNOSIS — G40409 Other generalized epilepsy and epileptic syndromes, not intractable, without status epilepticus: Secondary | ICD-10-CM

## 2020-12-09 DIAGNOSIS — E1151 Type 2 diabetes mellitus with diabetic peripheral angiopathy without gangrene: Secondary | ICD-10-CM | POA: Diagnosis present

## 2020-12-09 DIAGNOSIS — Z885 Allergy status to narcotic agent status: Secondary | ICD-10-CM

## 2020-12-09 DIAGNOSIS — E785 Hyperlipidemia, unspecified: Secondary | ICD-10-CM | POA: Diagnosis present

## 2020-12-09 DIAGNOSIS — I120 Hypertensive chronic kidney disease with stage 5 chronic kidney disease or end stage renal disease: Secondary | ICD-10-CM | POA: Diagnosis present

## 2020-12-09 DIAGNOSIS — Z8349 Family history of other endocrine, nutritional and metabolic diseases: Secondary | ICD-10-CM

## 2020-12-09 DIAGNOSIS — E669 Obesity, unspecified: Secondary | ICD-10-CM | POA: Diagnosis present

## 2020-12-09 DIAGNOSIS — Z8673 Personal history of transient ischemic attack (TIA), and cerebral infarction without residual deficits: Secondary | ICD-10-CM

## 2020-12-09 LAB — CBC WITH DIFFERENTIAL/PLATELET
Abs Immature Granulocytes: 0.04 10*3/uL (ref 0.00–0.07)
Basophils Absolute: 0.1 10*3/uL (ref 0.0–0.1)
Basophils Relative: 1 %
Eosinophils Absolute: 0.2 10*3/uL (ref 0.0–0.5)
Eosinophils Relative: 2 %
HCT: 34.3 % — ABNORMAL LOW (ref 39.0–52.0)
Hemoglobin: 11 g/dL — ABNORMAL LOW (ref 13.0–17.0)
Immature Granulocytes: 0 %
Lymphocytes Relative: 10 %
Lymphs Abs: 1 10*3/uL (ref 0.7–4.0)
MCH: 28.9 pg (ref 26.0–34.0)
MCHC: 32.1 g/dL (ref 30.0–36.0)
MCV: 90.3 fL (ref 80.0–100.0)
Monocytes Absolute: 0.7 10*3/uL (ref 0.1–1.0)
Monocytes Relative: 7 %
Neutro Abs: 7.7 10*3/uL (ref 1.7–7.7)
Neutrophils Relative %: 80 %
Platelets: 283 10*3/uL (ref 150–400)
RBC: 3.8 MIL/uL — ABNORMAL LOW (ref 4.22–5.81)
RDW: 14.1 % (ref 11.5–15.5)
WBC: 9.7 10*3/uL (ref 4.0–10.5)
nRBC: 0 % (ref 0.0–0.2)

## 2020-12-09 LAB — COMPREHENSIVE METABOLIC PANEL
ALT: 17 U/L (ref 0–44)
AST: 15 U/L (ref 15–41)
Albumin: 3.7 g/dL (ref 3.5–5.0)
Alkaline Phosphatase: 76 U/L (ref 38–126)
Anion gap: 12 (ref 5–15)
BUN: 65 mg/dL — ABNORMAL HIGH (ref 6–20)
CO2: 23 mmol/L (ref 22–32)
Calcium: 9.2 mg/dL (ref 8.9–10.3)
Chloride: 103 mmol/L (ref 98–111)
Creatinine, Ser: 12.68 mg/dL — ABNORMAL HIGH (ref 0.61–1.24)
GFR, Estimated: 5 mL/min — ABNORMAL LOW (ref >60)
Glucose, Bld: 101 mg/dL — ABNORMAL HIGH (ref 70–99)
Potassium: 5.4 mmol/L — ABNORMAL HIGH (ref 3.5–5.1)
Sodium: 138 mmol/L (ref 135–145)
Total Bilirubin: 0.9 mg/dL (ref 0.3–1.2)
Total Protein: 7.7 g/dL (ref 6.5–8.1)

## 2020-12-09 LAB — GLUCOSE, CAPILLARY: Glucose-Capillary: 93 mg/dL (ref 70–99)

## 2020-12-09 LAB — PHOSPHORUS: Phosphorus: 5.3 mg/dL — ABNORMAL HIGH (ref 2.5–4.6)

## 2020-12-09 LAB — CBG MONITORING, ED: Glucose-Capillary: 97 mg/dL (ref 70–99)

## 2020-12-09 LAB — RESP PANEL BY RT-PCR (FLU A&B, COVID) ARPGX2
Influenza A by PCR: NEGATIVE
Influenza B by PCR: NEGATIVE
SARS Coronavirus 2 by RT PCR: NEGATIVE

## 2020-12-09 LAB — ETHANOL: Alcohol, Ethyl (B): <10 mg/dL (ref <10)

## 2020-12-09 LAB — MAGNESIUM: Magnesium: 2.4 mg/dL (ref 1.7–2.4)

## 2020-12-09 IMAGING — CT CT HEAD W/O CM
3 series · 15 of 47 positions shown, 18 images · non-contrast
Comparison: MR head without contrast [DATE]

CLINICAL DATA: Seizure.

EXAM:
CT HEAD WITHOUT CONTRAST
TECHNIQUE: Contiguous axial images were obtained from the base of the skull
through the vertex without intravenous contrast.

[Series 2: head w o · axial · 0.43mm/px · z∈[+739,+874]mm · 9 of 33 slices shown, 12 images]
[im 3/33  brain]
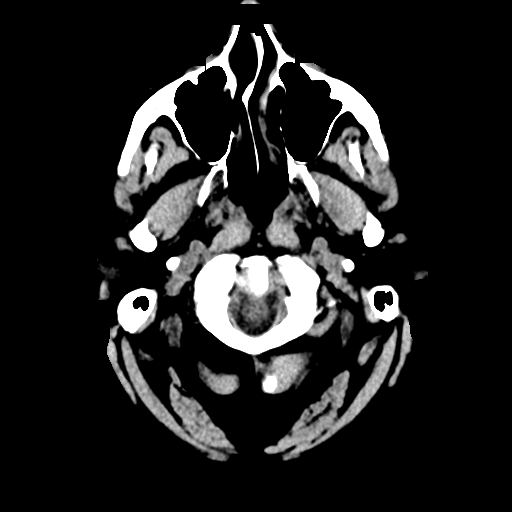
[im 3/33  bone]
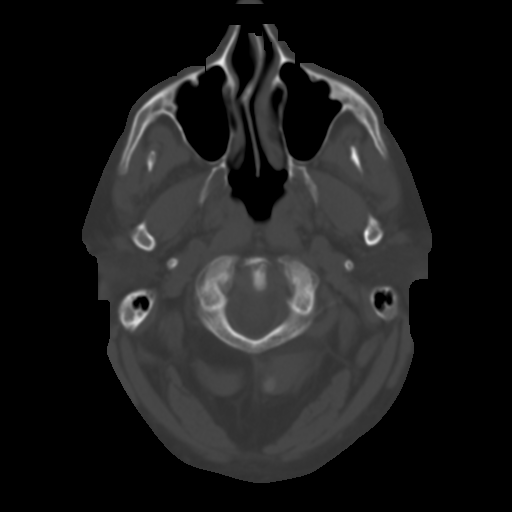
[im 6/33  brain]
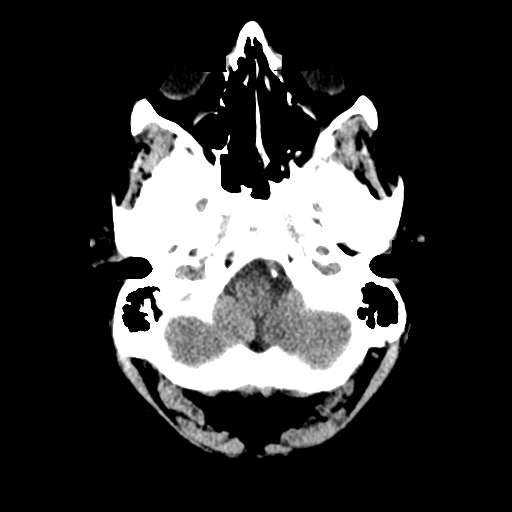
[im 9/33  brain]
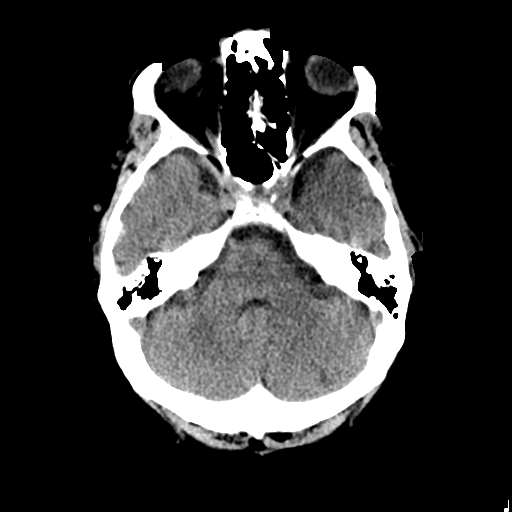
[im 13/33  brain]
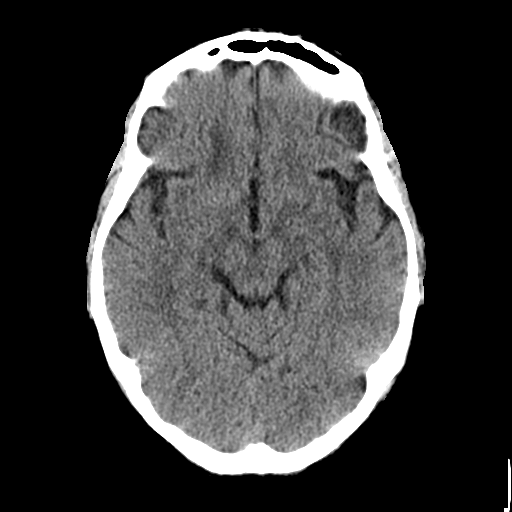
[im 17/33  brain]
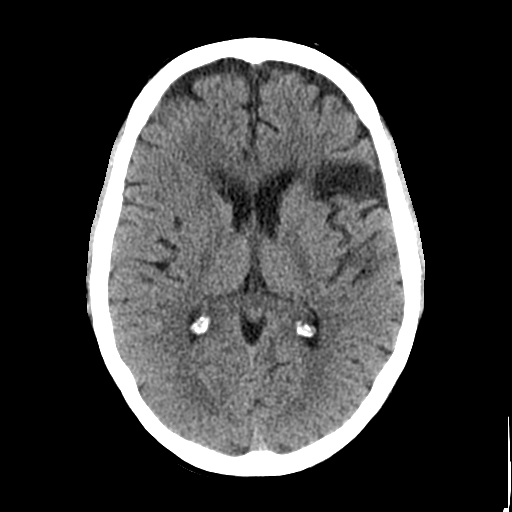
[im 17/33  bone]
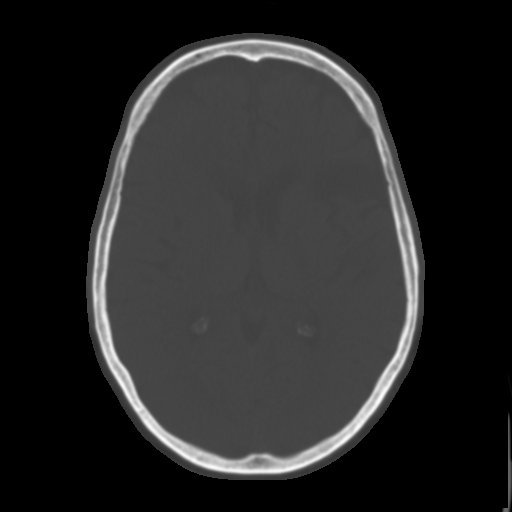
[im 20/33  brain]
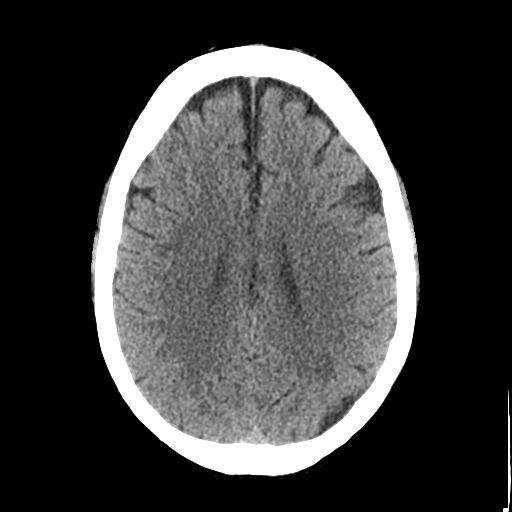
[im 24/33  brain]
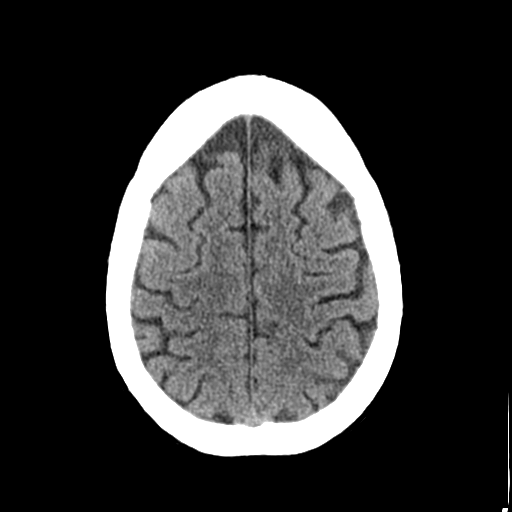
[im 27/33  brain]
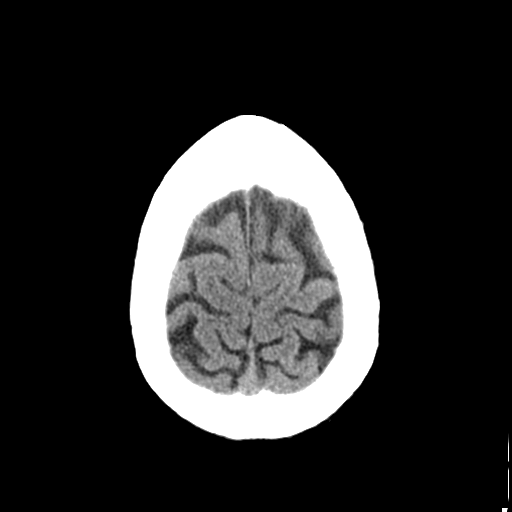
[im 30/33  brain]
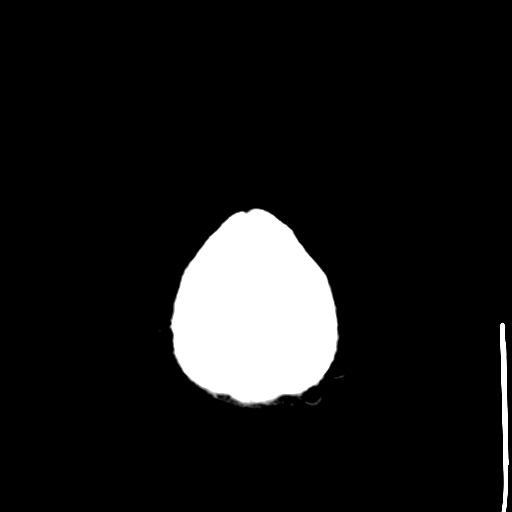
[im 30/33  bone]
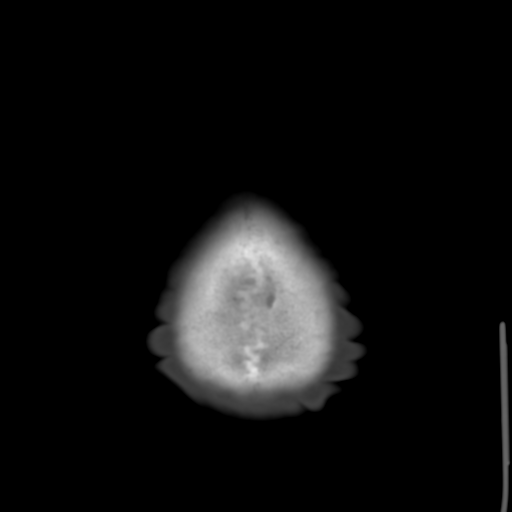

[Series 4: coronal soft · coronal · 0.33mm/px · 3 of 83 slices shown]
[im 28/83  brain]
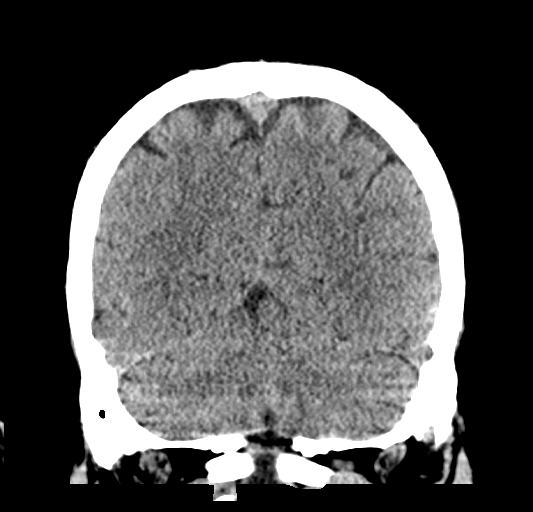
[im 37/83  brain]
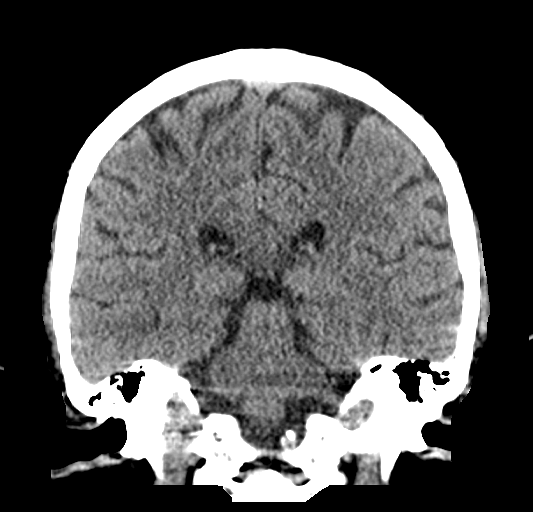
[im 46/83  brain]
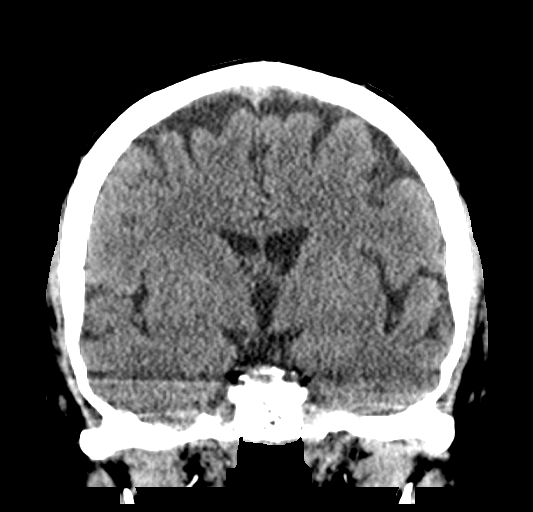

[Series 5: sagittal soft · sagittal · 0.37mm/px · 3 of 60 slices shown]
[im 20/60  brain]
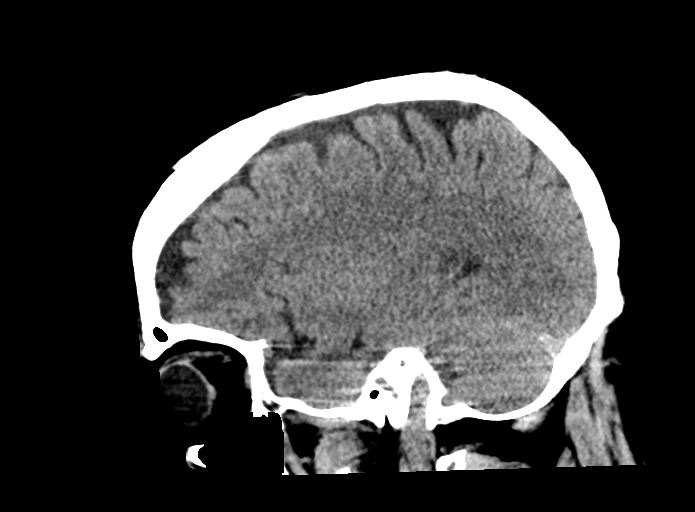
[im 30/60  brain]
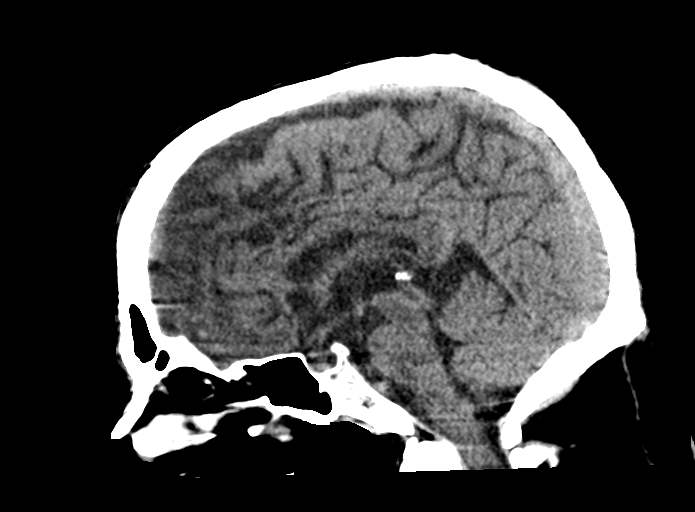
[im 40/60  brain]
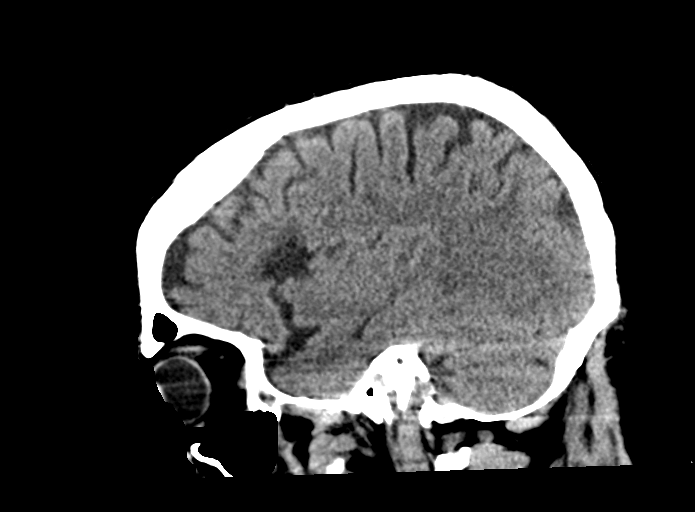

[15 of 47 positions shown; findings below may reference images not displayed]

FINDINGS: Brain: Chronic encephalomalacia again noted left frontal operculum.
Remote hemorrhagic infarct noted in the right caudate head. Remote
nonhemorrhagic lacunar infarcts again seen in the posterior left
cerebellum.

No acute infarct, hemorrhage, or mass lesion is present. No
significant interval change present. White matter is otherwise
within normal limits. The ventricles are of normal size. No
significant extraaxial fluid collection is present. Brainstem and
cerebellum are otherwise unremarkable.

Vascular: Minimal vascular calcifications are noted within the
cavernous internal carotid arteries. No hyperdense vessel is
present.

Skull: Calvarium is intact. No focal lytic or blastic lesions are
present. No significant extracranial soft tissue lesion is present.

Sinuses/Orbits: The paranasal sinuses and mastoid air cells are
clear. The globes and orbits are within normal limits.
IMPRESSION: 1. No acute intracranial abnormality or significant interval change.
2. Chronic encephalomalacia involving the left frontal operculum.
3. Remote hemorrhagic infarct of the right caudate head.
4. Remote nonhemorrhagic lacunar infarcts of the posterior left
cerebellum.

## 2020-12-09 MED ORDER — METOPROLOL TARTRATE 25 MG PO TABS
12.5000 mg | ORAL_TABLET | Freq: Two times a day (BID) | ORAL | Status: DC
  Administered 2020-12-09 – 2020-12-11 (×4): 12.5 mg via ORAL
  Filled 2020-12-09 (×4): qty 1

## 2020-12-09 MED ORDER — FLUOXETINE HCL 10 MG PO CAPS
10.0000 mg | ORAL_CAPSULE | Freq: Every day | ORAL | Status: DC
  Administered 2020-12-10 – 2020-12-13 (×4): 10 mg via ORAL
  Filled 2020-12-09 (×4): qty 1

## 2020-12-09 MED ORDER — LEVETIRACETAM IN NACL 1000 MG/100ML IV SOLN
1000.0000 mg | Freq: Once | INTRAVENOUS | Status: AC
  Administered 2020-12-09: 1000 mg via INTRAVENOUS
  Filled 2020-12-09: qty 100

## 2020-12-09 MED ORDER — ACETAMINOPHEN 325 MG PO TABS: 650.0000 mg | ORAL_TABLET | Freq: Four times a day (QID) | ORAL | Status: DC | PRN

## 2020-12-09 MED ORDER — POLYETHYLENE GLYCOL 3350 17 G PO PACK: 17.0000 g | PACK | Freq: Every day | ORAL | Status: DC | PRN

## 2020-12-09 MED ORDER — LEVETIRACETAM 500 MG PO TABS
500.0000 mg | ORAL_TABLET | Freq: Every day | ORAL | Status: DC
  Administered 2020-12-10 – 2020-12-13 (×4): 500 mg via ORAL
  Filled 2020-12-09 (×4): qty 1

## 2020-12-09 MED ORDER — ISOSORBIDE MONONITRATE ER 30 MG PO TB24
15.0000 mg | ORAL_TABLET | Freq: Every day | ORAL | Status: DC
  Administered 2020-12-10 – 2020-12-13 (×4): 15 mg via ORAL
  Filled 2020-12-09 (×4): qty 1

## 2020-12-09 MED ORDER — AMITRIPTYLINE HCL 25 MG PO TABS
75.0000 mg | ORAL_TABLET | Freq: Every day | ORAL | Status: DC
  Administered 2020-12-09 – 2020-12-12 (×4): 75 mg via ORAL
  Filled 2020-12-09 (×4): qty 3

## 2020-12-09 MED ORDER — ONDANSETRON HCL 4 MG PO TABS: 4.0000 mg | ORAL_TABLET | Freq: Four times a day (QID) | ORAL | Status: DC | PRN

## 2020-12-09 MED ORDER — ACETAMINOPHEN 650 MG RE SUPP: 650.0000 mg | Freq: Four times a day (QID) | RECTAL | Status: DC | PRN

## 2020-12-09 MED ORDER — LAMOTRIGINE 100 MG PO TABS
100.0000 mg | ORAL_TABLET | Freq: Two times a day (BID) | ORAL | Status: DC
  Administered 2020-12-09 – 2020-12-10 (×2): 100 mg via ORAL
  Filled 2020-12-09 (×2): qty 1

## 2020-12-09 MED ORDER — PANTOPRAZOLE SODIUM 40 MG PO TBEC
40.0000 mg | DELAYED_RELEASE_TABLET | Freq: Every day | ORAL | Status: DC
  Administered 2020-12-10 – 2020-12-13 (×4): 40 mg via ORAL
  Filled 2020-12-09 (×4): qty 1

## 2020-12-09 MED ORDER — BUPROPION HCL ER (SR) 150 MG PO TB12
150.0000 mg | ORAL_TABLET | Freq: Two times a day (BID) | ORAL | Status: DC
  Administered 2020-12-09 – 2020-12-11 (×4): 150 mg via ORAL
  Filled 2020-12-09 (×4): qty 1

## 2020-12-09 MED ORDER — SEVELAMER CARBONATE 800 MG PO TABS
1600.0000 mg | ORAL_TABLET | Freq: Three times a day (TID) | ORAL | Status: DC
  Administered 2020-12-11 – 2020-12-13 (×6): 1600 mg via ORAL
  Filled 2020-12-09 (×8): qty 2

## 2020-12-09 MED ORDER — ONDANSETRON HCL 4 MG/2ML IJ SOLN: 4.0000 mg | Freq: Four times a day (QID) | INTRAMUSCULAR | Status: DC | PRN

## 2020-12-09 MED ORDER — RANOLAZINE ER 500 MG PO TB12
500.0000 mg | ORAL_TABLET | Freq: Two times a day (BID) | ORAL | Status: DC
  Administered 2020-12-09 – 2020-12-13 (×7): 500 mg via ORAL
  Filled 2020-12-09 (×7): qty 1

## 2020-12-09 MED ORDER — HEPARIN SODIUM (PORCINE) 5000 UNIT/ML IJ SOLN
5000.0000 [IU] | Freq: Three times a day (TID) | INTRAMUSCULAR | Status: DC
  Administered 2020-12-09 – 2020-12-13 (×10): 5000 [IU] via SUBCUTANEOUS
  Filled 2020-12-09 (×11): qty 1

## 2020-12-09 MED ORDER — RENA-VITE PO TABS
1.0000 | ORAL_TABLET | Freq: Every day | ORAL | Status: DC
  Administered 2020-12-09 – 2020-12-12 (×4): 1 via ORAL
  Filled 2020-12-09 (×4): qty 1

## 2020-12-09 MED ORDER — CEPHALEXIN 250 MG PO CAPS
250.0000 mg | ORAL_CAPSULE | Freq: Two times a day (BID) | ORAL | Status: DC
  Administered 2020-12-09 – 2020-12-10 (×2): 250 mg via ORAL
  Filled 2020-12-09 (×2): qty 1

## 2020-12-09 MED ORDER — FUROSEMIDE 40 MG PO TABS
40.0000 mg | ORAL_TABLET | Freq: Every day | ORAL | Status: DC
  Administered 2020-12-10 – 2020-12-13 (×4): 40 mg via ORAL
  Filled 2020-12-09 (×4): qty 1

## 2020-12-09 NOTE — Plan of Care (Signed)
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
## 2020-12-09 NOTE — H&P (Addendum)
History and Physical    Max Pittman. CZY:[id number] DOB: [birth year] DOA: 12/09/2020  PCP: Chesley Noon, MD   Patient coming from: Home  I have personally briefly reviewed patient's old medical records in [place]  Chief Complaint: Seizures  HPI: Max Pittman. is a 45 y.o. male with medical history significant for  seizures, ESRD, DM, HTN, BPD.  Patient was brought to the ED via EMS reports of seizures.  Patient has a history of seizures, his last seizure was about a week ago, usually he has absent staring seizures. Today he had a seizure that was witnessed by his wife.   At time of my evaluation patient is awake and alert, but he still slightly confused and cannot remember the events of today, unable to confirm the history, or provide any significant history.  I called patient's wife Max Pittman, but no response. History obtained from chart review, reportedly patient had a seizure that lasted about 2 minutes per triage notes, started with an absence seizure, and then he had a full tonic-clonic seizure.  His last seizure was about a week ago. He denies alcohol intake.  ED Course: Stable vitals, BMP consistent with HD status. Stable cbc. Head C without acute abnormality.  Teleneurologist was consulted - Dr. Rory Percy, recommended iv keppra load- and continue renal dose home Keppra and Lamictal.  Review of Systems: As per HPI all other systems reviewed and negative.  Past Medical History:  Diagnosis Date   Anxiety    Arthritis    Bipolar disorder ([place])    Closed dislocation of left hip ([place]) 05/21/2019   Diabetes ([place])    Diabetes mellitus without complication (HCC)    no meds, type 2   DVT (deep venous thrombosis) (HCC)    Fibromyalgia    History of blood transfusion    w/surgery   History of peritoneal dialysis    HLD (hyperlipidemia)    on lipitor   Hypertension    Peripheral vascular disease ([place])    Renal disorder    FFGS - dialysis mon wed fri   Renal  disorder    Seizure ([place]) 08/04/2020   "SILENT SEIZURE" PER PATIENT   Sleep apnea    does not use cpap   Stroke [facility])    when ha was a child and as adult 03/07/20   Vasovagal syndrome    with syncope    Past Surgical History:  Procedure Laterality Date   A/V FISTULAGRAM Left 11/26/2020   Procedure: A/V FISTULAGRAM;  Surgeon: Serafina Mitchell, MD;  Location: [place] [facility];  Service: Cardiovascular;  Laterality: Left;   AMPUTATION Right 07/21/2019   Procedure: Right index finger amputation as necessary at distal interphalangeal joint;  Surgeon: Roseanne Kaufman, MD;  Location: [place];  Service: Orthopedics;  Laterality: Right;  60 mins   APPLICATION OF WOUND VAC Left 05/22/2019   Procedure: APPLICATION OF WOUND VAC  LEFT HIP;  Surgeon: Altamese [place], MD;  Location: [place];  Service: Orthopedics;  Laterality: Left;   APPLICATION OF WOUND VAC Left 07/17/2019   Procedure: WOUND VAC CHANGE;  Surgeon: Altamese Lajas, MD;  Location: [place];  Service: Orthopedics;  Laterality: Left;   AV FISTULA INSERTION W/ RF MAGNETIC GUIDANCE Left    AV FISTULA PLACEMENT     AV FISTULA PLACEMENT Left 04/08/2020   Procedure: ARTERIOVENOUS (AV) FISTULA CREATION;  Surgeon: Angelia Mould, MD;  Location: [place];  Service: Vascular;  Laterality: Left;   BASCILIC VEIN  TRANSPOSITION Left 08/06/2020   Procedure: LEFT ARM SECOND STAGE BASCILIC VEIN TRANSPOSITION;  Surgeon: Elam Dutch, MD;  Location: [facility];  Service: Vascular;  Laterality: Left;   BUBBLE STUDY  05/25/2019   Procedure: BUBBLE STUDY;  Surgeon: Dorothy Spark, MD;  Location: [facility] ENDOSCOPY;  Service: Cardiovascular;;   CHOLECYSTECTOMY     HERNIA REPAIR     HIP CLOSED REDUCTION Left 05/20/2019   Procedure: CLOSED REDUCTION HIP WITH TRACTION PIN APPLICATION;  Surgeon: Altamese Carmichael, MD;  Location: [place];  Service: Orthopedics;  Laterality: Left;   INCISION AND DRAINAGE HIP Left 07/14/2019   Procedure: IRRIGATION AND DEBRIDEMENT HIP;   Surgeon: Altamese [place], MD;  Location: [place];  Service: Orthopedics;  Laterality: Left;   INCISION AND DRAINAGE HIP Left 07/17/2019   Procedure: REPEAT IRRIGATION AND DEBRIDEMENT LEFT HIP;  Surgeon: Altamese Holley, MD;  Location: [place];  Service: Orthopedics;  Laterality: Left;   IR FLUORO GUIDE CV LINE RIGHT  05/23/2019   IR FLUORO GUIDE CV LINE RIGHT  07/21/2019   IR PERC TUN PERIT CATH WO PORT S&I /IMAG  03/20/2020   IR REMOVAL TUN CV CATH W/O FL  08/29/2019   IR US GUIDE VASC ACCESS RIGHT  05/23/2019   IR US GUIDE VASC ACCESS RIGHT  07/21/2019   IR US GUIDE VASC ACCESS RIGHT  03/20/2020   JOINT REPLACEMENT     LEFT HEART CATH AND CORONARY ANGIOGRAPHY N/A 03/22/2019   Procedure: LEFT HEART CATH AND CORONARY ANGIOGRAPHY;  Surgeon: Wellington Hampshire, MD;  Location: [place] [facility];  Service: Cardiovascular;  Laterality: N/A;   LOOP RECORDER INSERTION N/A 05/25/2019   Procedure: LOOP RECORDER INSERTION;  Surgeon: [name], MD;  Location: [place] [facility];  Service: Cardiovascular;  Laterality: N/A;   MINOR REMOVAL OF PERITONEAL DIALYSIS CATHETER N/A 03/22/2020   Procedure: REMOVAL OF PERITONEAL DIALYSIS CATHETER;  Surgeon: Kieth Brightly, Arta Bruce, MD;  Location: [place];  Service: General;  Laterality: N/A;  ROOM 1 STATING AT 11:00AM FOR 60 MIN   ORIF ACETABULAR FRACTURE Left 05/22/2019   Procedure: OPEN REDUCTION INTERNAL FIXATION (ORIF) T TYPE  WITH ASSOCIATED POSTERIOR WALL ACETABULAR FRACTURE, LEFT; REMOVAL OF TRACTION PIN LEFT TIBIA;  Surgeon: Altamese Reeds, MD;  Location: [place];  Service: Orthopedics;  Laterality: Left;   REMOVAL OF A DIALYSIS CATHETER Right 07/17/2019   Procedure: REMOVAL OF HEMODIALYSIS DIALYSIS CATHETER;  Surgeon: Altamese Bolan, MD;  Location: [place];  Service: Orthopedics;  Laterality: Right;   TEE WITHOUT CARDIOVERSION N/A 05/25/2019   Procedure: TRANSESOPHAGEAL ECHOCARDIOGRAM (TEE);  Surgeon: Dorothy Spark, MD;  Location: [place];  Service:  Cardiovascular;  Laterality: N/A;   TEE WITHOUT CARDIOVERSION N/A 07/19/2019   Procedure: TRANSESOPHAGEAL ECHOCARDIOGRAM (TEE);  Surgeon: Sueanne Margarita, MD;  Location: [facility] ENDOSCOPY;  Service: Cardiovascular;  Laterality: N/A;   TONSILLECTOMY       reports that he quit smoking about 2 years ago. His smoking use included cigarettes. He has never used smokeless tobacco. He reports that he does not currently use alcohol. He reports that he does not use drugs.  Allergies  Allergen Reactions   Doxycycline Hives   Latex Swelling    Pt reports swelling at site.    Morphine Anxiety    Family History  Problem Relation Age of Onset   Heart disease Mother    Diabetes Mother    Hypertension Mother    Diabetes Father    Hypertension Father    CAD Mother        "  angina"   Hypercholesterolemia Mother    Hypercholesterolemia Father    Healthy Brother    Prior to Admission medications   Medication Sig Start Date End Date Taking? Authorizing Provider  amitriptyline (ELAVIL) 75 MG tablet TAKE 1 TABLET BY MOUTH AT BEDTIME. 10/21/20   Raulkar, Clide Deutscher, MD  atorvastatin (LIPITOR) 40 MG tablet TAKE 1 TABLET BY MOUTH DAILY AT 6 PM. 07/22/20   Raulkar, Clide Deutscher, MD  baclofen (LIORESAL) 10 MG tablet baclofen 10 mg tablet  TAKE ONE TABLET (10 MG DOSE) BY MOUTH 3 (THREE) TIMES A DAY FOR 30 DAYS. 04/11/19   [provider]  Blood Glucose Monitoring Suppl (GLUCOCOM BLOOD GLUCOSE MONITOR) DEVI Use to check blood sugar daily. E11.40 09/20/20   [provider]  buPROPion (WELLBUTRIN SR) 150 MG 12 hr tablet Take 1 tablet (150 mg total) by mouth 2 (two) times daily with a meal. 06/16/19   Angiulli, Lavon Paganini, PA-C  cephALEXin (KEFLEX) 250 MG capsule Take 1 capsule (250 mg total) by mouth every 12 (twelve) hours. 10/14/20   [place] Callas, NP  Cholecalciferol 25 MCG (1000 UT) tablet cholecalciferol (vitamin D3) 25 mcg (1,000 unit) tablet   2000 units by oral route. 06/16/19   [provider]  [employer] ANTI-ITCH lotion APPLY TOPICALLY AS NEEDED FOR ITCHING. 06/04/20   Raulkar, Clide Deutscher, MD  docusate sodium (COLACE) 100 MG capsule TAKE 1 CAPSULE BY MOUTH TWICE A DAY 09/23/20   Raulkar, Clide Deutscher, MD  gabapentin (NEURONTIN) 300 MG capsule TAKE 1 CAPSULE BY MOUTH THREE TIMES A DAY 09/20/20   Raulkar, Clide Deutscher, MD  hydrALAZINE (APRESOLINE) 25 MG tablet Take 1 tablet by mouth 3 (three) times daily. 04/17/19   [provider]  isosorbide mononitrate (IMDUR) 30 MG 24 hr tablet Take 0.5 tablets (15 mg total) by mouth daily. 05/24/20   Raulkar, Clide Deutscher, MD  lamoTRIgine (LAMICTAL) 100 MG tablet TAKE 1 TABLET BY MOUTH TWICE A DAY 05/23/20   Frann Rider, NP  levETIRAcetam (KEPPRA) 500 MG tablet Take 1 tablet (500 mg total) by mouth daily. Patient taking differently: Take 500 mg by mouth at bedtime. 12/04/19   Frann Rider, NP  loratadine (CLARITIN) 10 MG tablet Take 1 tablet (10 mg total) by mouth daily. Patient taking differently: Take 10 mg by mouth daily as needed. 06/17/19   Angiulli, Lavon Paganini, PA-C  meclizine (ANTIVERT) 25 MG tablet TAKE 1 TABLET BY MOUTH THREE TIMES A DAY 07/22/20   Raulkar, Clide Deutscher, MD  melatonin 5 MG TABS Take 1 tablet (5 mg total) by mouth at bedtime. 04/11/20   Love, Ivan Anchors, PA-C  metoprolol tartrate (LOPRESSOR) 25 MG tablet Take 0.5 tablets (12.5 mg total) by mouth 2 (two) times daily. 04/11/20 11/21/21  Love, Ivan Anchors, PA-C  multivitamin (RENA-VIT) TABS tablet Take 1 tablet by mouth at bedtime. 04/11/20   Love, Ivan Anchors, PA-C  oxyCODONE-acetaminophen (PERCOCET) 7.5-325 MG tablet Take 1 tablet by mouth every 8 (eight) hours as needed. 11/15/20   [provider]  pantoprazole (PROTONIX) 40 MG tablet TAKE 1 TABLET BY MOUTH EVERY DAY 07/22/20   Raulkar, Clide Deutscher, MD  polyethylene glycol (MIRALAX / GLYCOLAX) 17 g packet Take 17 g by mouth daily. Patient taking differently: Take 17 g by mouth daily as needed. 04/12/20   Love, Ivan Anchors, PA-C   ranolazine (RANEXA) 500 MG 12 hr tablet Take 1 tablet (500 mg total) by mouth 2 (two) times daily. Please make yearly appt with Cardiologist for  October 2022 for future refills. Thank you 1st attempt 10/18/20   Verta Ellen., NP  sevelamer carbonate (RENVELA) 800 MG tablet Take 2 tablets (1,600 mg total) by mouth 3 (three) times daily with meals. 04/11/20   Love, Ivan Anchors, PA-C  Vitamin D, Ergocalciferol, (DRISDOL) 1.25 MG (50000 UNIT) CAPS capsule Take 50,000 Units by mouth 2 (two) times a week. 09/19/20   [provider]    Physical Exam: Vitals:   12/09/20 1258 12/09/20 1300 12/09/20 1400 12/09/20 1500  BP:  (!) 173/90 (!) 155/87 137/76  Pulse:  91 90 88  Resp:  19 (!) 23 16  Temp:  98.5 F (36.9 C)    TempSrc:  Axillary    SpO2: 92% 91% 95% 94%    Constitutional: NAD, calm, comfortable Vitals:   12/09/20 1258 12/09/20 1300 12/09/20 1400 12/09/20 1500  BP:  (!) 173/90 (!) 155/87 137/76  Pulse:  91 90 88  Resp:  19 (!) 23 16  Temp:  98.5 F (36.9 C)    TempSrc:  Axillary    SpO2: 92% 91% 95% 94%   Eyes: PERRL, lids and conjunctivae normal ENMT: Mucous membranes are moist Neck: normal, supple, no masses, no thyromegaly Respiratory: clear to auscultation bilaterally, no wheezing, no crackles. Normal respiratory effort.  Cardiovascular: Regular rate and rhythm, no murmurs / rubs / gallops. No extremity edema.  HD access right upper chest. Abdomen: no tenderness, no masses palpated. No hepatosplenomegaly. Bowel sounds positive.  Musculoskeletal: no clubbing / cyanosis. No joint deformity upper and lower extremities.  Skin: no rashes, lesions, ulcers. No induration Neurologic: was previously somnolent, awake and alert now, but still confused, cant remember the details of today.  He is unaware he had a seizure. Psychiatric: Unable to assess.  Patient is still relatively postictal.  Labs on Admission: I have personally reviewed following labs and imaging  studies  CBC: Recent Labs  Lab 12/09/20 1357  WBC 9.7  NEUTROABS 7.7  HGB 11.0*  HCT 34.3*  MCV 90.3  PLT 332   Basic Metabolic Panel: Recent Labs  Lab 12/09/20 1357  NA 138  K 5.4*  CL 103  CO2 23  GLUCOSE 101*  BUN 65*  CREATININE 12.68*  CALCIUM 9.2   GFR: Estimated Creatinine Clearance: 9.3 mL/min (A) (by C-G formula based on SCr of 12.68 mg/dL (H)). Liver Function Tests: Recent Labs  Lab 12/09/20 1357  AST 15  ALT 17  ALKPHOS 76  BILITOT 0.9  PROT 7.7  ALBUMIN 3.7   CBG: Recent Labs  Lab 12/09/20 1413  GLUCAP 97    Radiological Exams on Admission: CT Head Wo Contrast  Result Date: 12/09/2020 CLINICAL DATA:  Seizure. EXAM: CT HEAD WITHOUT CONTRAST TECHNIQUE: Contiguous axial images were obtained from the base of the skull through the vertex without intravenous contrast. COMPARISON:  MR head without contrast 12/20/2019 FINDINGS: Brain: Chronic encephalomalacia again noted left frontal operculum. Remote hemorrhagic infarct noted in the right caudate head. Remote nonhemorrhagic lacunar infarcts again seen in the posterior left cerebellum. No acute infarct, hemorrhage, or mass lesion is present. No significant interval change present. White matter is otherwise within normal limits. The ventricles are of normal size. No significant extraaxial fluid collection is present. Brainstem and cerebellum are otherwise unremarkable. Vascular: Minimal vascular calcifications are noted within the cavernous internal carotid arteries. No hyperdense vessel is present. Skull: Calvarium is intact. No focal lytic or blastic lesions are present. No significant extracranial soft tissue lesion is present. Sinuses/Orbits: The  paranasal sinuses and mastoid air cells are clear. The globes and orbits are within normal limits. IMPRESSION: 1. No acute intracranial abnormality or significant interval change. 2. 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 3. 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 4. Remote nonhemorrhagic lacunar infarcts of the posterior left cerebellum. Electronically Signed   By: San Morelle M.D.   On: 12/09/2020 15:28    EKG: None.  Assessment/Plan Principal Problem:   Seizures ([place]) Active Problems:   Diabetes mellitus ([place])   ESRD (end stage renal disease) ([place])   Essential hypertension   OSA (obstructive sleep apnea)   DVT (deep venous thrombosis) (HCC)   Seizures- normally has absence seizures, but today had absence seizure with tonic-clonic seizure activity.  With prolonged postictal period.  Head ct without acute abnormality. Home meds- keppra and lamictal. Med list also has gabapentin. Unknown if patient has been compliant, unknown if new drugs, patient is unable to tell at this time.  Per report last seizure was about a week ago.  Head CT without acute abnormality. -Teleneurology was consulted, recommended EEG, load with 1g Keppra-continue home Keppra dose- 500mg  daily and lamictal -Check magnesium, phosphorus, blood alcohol level-all unremarkable -Seizure precautions -Ativan as needed -Neurology consult in the morning -Check Lamictal level  ESRD-was previously on peritoneal dialysis, it appears he is now receiving hemodialysis at [facility] [facility], per Care Everywhere.  Last dialysis likely 11/11.  He tells me he thinks his schedule is Monday Wednesday Friday.  Potassium 5.4.  BUN 65, creatinine 12.68.  Does not appear volume overloaded.  Blood pressure stable. -Please consult nephrology in the morning. -Resume Renal medications -Resume Lasix 40 mg daily  HTN-currently in the 130s. -Resume Imdur, hydralazine 25 3 times daily, metoprolol, 12.5 twice daily  BPD -Resume bupropion  Prozac, Elavil.  DM- Hgba1c 5.5. Glucose 101. Not on meds. - Daily fasting CBG.  DVT prophylaxis: Heparin Code Status: Full code Family Communication: None at bedside.  Unable to reach spouse Max Pittman on the phone. Disposition Plan: ~ 2  days Consults called: None Admission status: Obs stepdown I certify that at the point of admission it is my clinical judgment that the patient will require inpatient hospital care spanning beyond 2 midnights from the point of admission due to high intensity of service, high risk for further deterioration and high frequency of surveillance required.   Bethena Roys MD [facility]  12/09/2020, 6:20 PM

## 2020-12-09 NOTE — ED Triage Notes (Signed)
Patient brought in by EMS for seizures. EMS states that called out by wife who witnessed the seizure that lasted about 2 min, full tonic clonic seizure.  Patient has a hx of seizures, last one was last week.    CBG: 152

## 2020-12-09 NOTE — Plan of Care (Signed)
On-call phone consultation note. Called by Dr. Kathrynn Humble regarding this patient with a history of seizures currently on Keppra and Lamictal at home. This morning had a seizure and continues to be very lethargic although opens eyes and answers some questions appropriately. Given the prolonged postictal state, I would recommend that he be admitted to the hospital and routine consultation be obtained.  Also obtain a routine EEG. Additional 1 g Keppra IV load should also be given in the emergency room while he is awaiting admission and formal neurological consultation over the next 24 hours. Continue home dose of Keppra (renal dose) and Lamictal. Maintain seizure precautions  -- Amie Portland, MD Neurologist [facility] Pager: [phone]

## 2020-12-09 NOTE — ED Provider Notes (Signed)
Blood pressure 137/76, pulse 88, temperature 98.5 F (36.9 C), temperature source Axillary, resp. rate 16, SpO2 94 %.  Assuming care from Dr. Kathrynn Humble.  In short, Max Pittman. is a 45 y.o. male with a chief complaint of Seizures .  Refer to the original H&P for additional details.  The current plan of care is to follow up with [facility]. Plan for Keppra load and admit for EEG.  Discussed patient's case with [facility], Dr. Denton Brick to request admission. Patient and family (if present) updated with plan. Care transferred to [facility] service.  I reviewed all nursing notes, vitals, pertinent old records, EKGs, labs, imaging (as available).   Procedures  None     Senora Lacson, [facility], MD 12/09/20 [phone]

## 2020-12-10 ENCOUNTER — Observation Stay (HOSPITAL_COMMUNITY)
Admit: 2020-12-10 | Discharge: 2020-12-10 | Disposition: A | Discharge status: Home / Self Care | Payer: Medicare Other | Attending: Internal Medicine | Admitting: Internal Medicine

## 2020-12-10 ENCOUNTER — Ambulatory Visit: Payer: Medicare Other | Admitting: Physical Medicine and Rehabilitation

## 2020-12-10 DIAGNOSIS — R569 Unspecified convulsions: Secondary | ICD-10-CM

## 2020-12-10 DIAGNOSIS — F319 Bipolar disorder, unspecified: Secondary | ICD-10-CM | POA: Diagnosis present

## 2020-12-10 DIAGNOSIS — Z992 Dependence on renal dialysis: Secondary | ICD-10-CM | POA: Diagnosis not present

## 2020-12-10 DIAGNOSIS — E1151 Type 2 diabetes mellitus with diabetic peripheral angiopathy without gangrene: Secondary | ICD-10-CM | POA: Diagnosis present

## 2020-12-10 DIAGNOSIS — I10 Essential (primary) hypertension: Secondary | ICD-10-CM

## 2020-12-10 DIAGNOSIS — F419 Anxiety disorder, unspecified: Secondary | ICD-10-CM | POA: Diagnosis present

## 2020-12-10 DIAGNOSIS — Z8673 Personal history of transient ischemic attack (TIA), and cerebral infarction without residual deficits: Secondary | ICD-10-CM | POA: Diagnosis not present

## 2020-12-10 DIAGNOSIS — Z86718 Personal history of other venous thrombosis and embolism: Secondary | ICD-10-CM | POA: Diagnosis not present

## 2020-12-10 DIAGNOSIS — N186 End stage renal disease: Secondary | ICD-10-CM | POA: Diagnosis present

## 2020-12-10 DIAGNOSIS — G9341 Metabolic encephalopathy: Secondary | ICD-10-CM | POA: Diagnosis present

## 2020-12-10 DIAGNOSIS — E785 Hyperlipidemia, unspecified: Secondary | ICD-10-CM | POA: Diagnosis present

## 2020-12-10 DIAGNOSIS — G40A09 Absence epileptic syndrome, not intractable, without status epilepticus: Secondary | ICD-10-CM | POA: Diagnosis present

## 2020-12-10 DIAGNOSIS — I120 Hypertensive chronic kidney disease with stage 5 chronic kidney disease or end stage renal disease: Secondary | ICD-10-CM | POA: Diagnosis present

## 2020-12-10 DIAGNOSIS — Z6833 Body mass index (BMI) 33.0-33.9, adult: Secondary | ICD-10-CM | POA: Diagnosis not present

## 2020-12-10 DIAGNOSIS — G4733 Obstructive sleep apnea (adult) (pediatric): Secondary | ICD-10-CM | POA: Diagnosis present

## 2020-12-10 DIAGNOSIS — E669 Obesity, unspecified: Secondary | ICD-10-CM | POA: Diagnosis present

## 2020-12-10 DIAGNOSIS — F3164 Bipolar disorder, current episode mixed, severe, with psychotic features: Secondary | ICD-10-CM

## 2020-12-10 DIAGNOSIS — M1990 Unspecified osteoarthritis, unspecified site: Secondary | ICD-10-CM | POA: Diagnosis present

## 2020-12-10 DIAGNOSIS — N2581 Secondary hyperparathyroidism of renal origin: Secondary | ICD-10-CM | POA: Diagnosis present

## 2020-12-10 DIAGNOSIS — F22 Delusional disorders: Secondary | ICD-10-CM | POA: Diagnosis present

## 2020-12-10 DIAGNOSIS — Z20822 Contact with and (suspected) exposure to covid-19: Secondary | ICD-10-CM | POA: Diagnosis present

## 2020-12-10 DIAGNOSIS — I2510 Atherosclerotic heart disease of native coronary artery without angina pectoris: Secondary | ICD-10-CM | POA: Diagnosis present

## 2020-12-10 DIAGNOSIS — Z87891 Personal history of nicotine dependence: Secondary | ICD-10-CM | POA: Diagnosis not present

## 2020-12-10 DIAGNOSIS — M898X9 Other specified disorders of bone, unspecified site: Secondary | ICD-10-CM | POA: Diagnosis present

## 2020-12-10 DIAGNOSIS — M797 Fibromyalgia: Secondary | ICD-10-CM | POA: Diagnosis present

## 2020-12-10 DIAGNOSIS — E1122 Type 2 diabetes mellitus with diabetic chronic kidney disease: Secondary | ICD-10-CM | POA: Diagnosis present

## 2020-12-10 DIAGNOSIS — D631 Anemia in chronic kidney disease: Secondary | ICD-10-CM | POA: Diagnosis present

## 2020-12-10 LAB — HEPATITIS B SURFACE ANTIGEN: Hepatitis B Surface Ag: NONREACTIVE

## 2020-12-10 LAB — MRSA NEXT GEN BY PCR, NASAL: MRSA by PCR Next Gen: NOT DETECTED

## 2020-12-10 LAB — GLUCOSE, CAPILLARY
Glucose-Capillary: 71 mg/dL (ref 70–99)
Glucose-Capillary: 74 mg/dL (ref 70–99)

## 2020-12-10 LAB — HIV ANTIBODY (ROUTINE TESTING W REFLEX): HIV Screen 4th Generation wRfx: NONREACTIVE

## 2020-12-10 LAB — LAMOTRIGINE LEVEL: Lamotrigine Lvl: 3.2 ug/mL (ref 2.0–20.0)

## 2020-12-10 MED ORDER — CEPHALEXIN 250 MG PO CAPS
250.0000 mg | ORAL_CAPSULE | Freq: Two times a day (BID) | ORAL | Status: DC
  Administered 2020-12-10 – 2020-12-13 (×6): 250 mg via ORAL
  Filled 2020-12-10 (×6): qty 1

## 2020-12-10 MED ORDER — GABAPENTIN 300 MG PO CAPS
300.0000 mg | ORAL_CAPSULE | Freq: Three times a day (TID) | ORAL | Status: DC
  Administered 2020-12-10 – 2020-12-13 (×7): 300 mg via ORAL
  Filled 2020-12-10 (×7): qty 1

## 2020-12-10 MED ORDER — CHLORHEXIDINE GLUCONATE CLOTH 2 % EX PADS
6.0000 | MEDICATED_PAD | Freq: Every day | CUTANEOUS | Status: DC
  Administered 2020-12-10 – 2020-12-13 (×4): 6 via TOPICAL

## 2020-12-10 MED ORDER — LAMOTRIGINE 25 MG PO TABS
125.0000 mg | ORAL_TABLET | Freq: Every day | ORAL | Status: DC
  Administered 2020-12-10 – 2020-12-12 (×3): 125 mg via ORAL
  Filled 2020-12-10 (×4): qty 1

## 2020-12-10 MED ORDER — CHLORHEXIDINE GLUCONATE CLOTH 2 % EX PADS
6.0000 | MEDICATED_PAD | Freq: Every day | CUTANEOUS | Status: DC
  Administered 2020-12-11 – 2020-12-13 (×2): 6 via TOPICAL

## 2020-12-10 MED ORDER — HEPARIN SODIUM (PORCINE) 1000 UNIT/ML IJ SOLN
INTRAMUSCULAR | Status: AC
  Administered 2020-12-10: 1000 [IU]
  Filled 2020-12-10: qty 4

## 2020-12-10 MED ORDER — LAMOTRIGINE 100 MG PO TABS
100.0000 mg | ORAL_TABLET | Freq: Every morning | ORAL | Status: DC
  Administered 2020-12-11 – 2020-12-13 (×3): 100 mg via ORAL
  Filled 2020-12-10 (×3): qty 1

## 2020-12-10 MED ORDER — CALCITRIOL 0.25 MCG PO CAPS
0.2500 ug | ORAL_CAPSULE | ORAL | Status: DC
  Administered 2020-12-10 – 2020-12-12 (×2): 0.25 ug via ORAL
  Filled 2020-12-10 (×2): qty 1

## 2020-12-10 MED ORDER — DARBEPOETIN ALFA 60 MCG/0.3ML IJ SOSY
60.0000 ug | PREFILLED_SYRINGE | INTRAMUSCULAR | Status: DC
  Filled 2020-12-10: qty 0.3

## 2020-12-10 MED ORDER — DARBEPOETIN ALFA 60 MCG/0.3ML IJ SOSY
PREFILLED_SYRINGE | INTRAMUSCULAR | Status: AC
  Administered 2020-12-10: 60 ug via INTRAVENOUS
  Filled 2020-12-10: qty 0.3

## 2020-12-10 NOTE — Consult Note (Signed)
Reason for Consult: To manage dialysis and dialysis related needs Referring Physician: Keyonta Barradas. is an 45 y.o. male with past medical history significant for DM, HTN, sz d/o, bipolar d/o and ESRD-  previously on PD-  had to be transitioned to IHD in March of 2022 due to membrane failure- currently on HD at [facility].  He was on MWF and they this week were transitioning to TTS so last HD was Friday.  He was brought to ER last night with sz-  he denies missing any sz meds.  This AM he is oriented- we are asked to provide routine HD   Dialyzes at [facility]-  was MWF-  now to be TTS   EDW 104.5. HD [place] 2/2.5, Dialyzer 300 rev, Heparin 1200 load , then 500 per hour. Access [facility]-  has left upper arm AVF-  was due to have revision on Friday.  600 of EPO- calcitriol 0.25 and 50 venofer per week   Past Medical History:  Diagnosis Date   Anxiety    Arthritis    Bipolar disorder ([place])    Closed dislocation of left hip ([place]) 05/21/2019   Diabetes ([place])    Diabetes mellitus without complication (HCC)    no meds, type 2   DVT (deep venous thrombosis) (HCC)    Fibromyalgia    History of blood transfusion    w/surgery   History of peritoneal dialysis    HLD (hyperlipidemia)    on lipitor   Hypertension    Peripheral vascular disease ([place])    Renal disorder    FFGS - dialysis mon wed fri   Renal disorder    Seizure ([place]) 08/04/2020   "SILENT SEIZURE" PER PATIENT   Sleep apnea    does not use cpap   Stroke [facility])    when ha was a child and as adult 03/07/20   Vasovagal syndrome    with syncope    Past Surgical History:  Procedure Laterality Date   A/V FISTULAGRAM Left 11/26/2020   Procedure: A/V FISTULAGRAM;  Surgeon: Serafina Mitchell, MD;  Location: [place] CV LAB;  Service: Cardiovascular;  Laterality: Left;   AMPUTATION Right 07/21/2019   Procedure: Right index finger amputation as necessary at distal interphalangeal joint;  Surgeon: Roseanne Kaufman,  MD;  Location: [place];  Service: Orthopedics;  Laterality: Right;  60 mins   APPLICATION OF WOUND VAC Left 05/22/2019   Procedure: APPLICATION OF WOUND VAC  LEFT HIP;  Surgeon: Altamese Waltham, MD;  Location: [place];  Service: Orthopedics;  Laterality: Left;   APPLICATION OF WOUND VAC Left 07/17/2019   Procedure: WOUND VAC CHANGE;  Surgeon: Altamese Lafayette, MD;  Location: [place];  Service: Orthopedics;  Laterality: Left;   AV FISTULA INSERTION W/ RF MAGNETIC GUIDANCE Left    AV FISTULA PLACEMENT     AV FISTULA PLACEMENT Left 04/08/2020   Procedure: ARTERIOVENOUS (AV) FISTULA CREATION;  Surgeon: Angelia Mould, MD;  Location: [place];  Service: Vascular;  Laterality: Left;   BASCILIC VEIN TRANSPOSITION Left 08/06/2020   Procedure: LEFT ARM SECOND STAGE [place];  Surgeon: Elam Dutch, MD;  Location: [place];  Service: Vascular;  Laterality: Left;   BUBBLE STUDY  05/25/2019   Procedure: BUBBLE STUDY;  Surgeon: Dorothy Spark, MD;  Location: [place];  Service: Cardiovascular;;   CHOLECYSTECTOMY     HERNIA REPAIR     HIP CLOSED REDUCTION Left 05/20/2019   Procedure: CLOSED REDUCTION HIP  WITH TRACTION PIN APPLICATION;  Surgeon: Altamese Laramie, MD;  Location: [place];  Service: Orthopedics;  Laterality: Left;   INCISION AND DRAINAGE HIP Left 07/14/2019   Procedure: IRRIGATION AND DEBRIDEMENT HIP;  Surgeon: Altamese Kure [place], MD;  Location: [place];  Service: Orthopedics;  Laterality: Left;   INCISION AND DRAINAGE HIP Left 07/17/2019   Procedure: REPEAT IRRIGATION AND DEBRIDEMENT LEFT HIP;  Surgeon: Altamese Headrick, MD;  Location: [place];  Service: Orthopedics;  Laterality: Left;   IR FLUORO GUIDE CV LINE RIGHT  05/23/2019   IR FLUORO GUIDE CV LINE RIGHT  07/21/2019   IR PERC TUN PERIT CATH WO PORT S&I /IMAG  03/20/2020   IR REMOVAL TUN CV CATH W/O FL  08/29/2019   IR US GUIDE VASC ACCESS RIGHT  05/23/2019   IR US GUIDE VASC ACCESS RIGHT  07/21/2019   IR US GUIDE VASC  ACCESS RIGHT  03/20/2020   JOINT REPLACEMENT     LEFT HEART CATH AND CORONARY ANGIOGRAPHY N/A 03/22/2019   Procedure: LEFT HEART CATH AND CORONARY ANGIOGRAPHY;  Surgeon: Wellington Hampshire, MD;  Location: [place] CV LAB;  Service: Cardiovascular;  Laterality: N/A;   LOOP RECORDER INSERTION N/A 05/25/2019   Procedure: LOOP RECORDER INSERTION;  Surgeon: [name], MD;  Location: [place] CV LAB;  Service: Cardiovascular;  Laterality: N/A;   MINOR REMOVAL OF PERITONEAL DIALYSIS CATHETER N/A 03/22/2020   Procedure: REMOVAL OF PERITONEAL DIALYSIS CATHETER;  Surgeon: Kieth Brightly, Arta Bruce, MD;  Location: [place];  Service: General;  Laterality: N/A;  ROOM 1 STATING AT 11:00AM FOR 60 MIN   ORIF ACETABULAR FRACTURE Left 05/22/2019   Procedure: OPEN REDUCTION INTERNAL FIXATION (ORIF) T TYPE  WITH ASSOCIATED POSTERIOR WALL ACETABULAR FRACTURE, LEFT; REMOVAL OF TRACTION PIN LEFT TIBIA;  Surgeon: Altamese Edgemont, MD;  Location: [place];  Service: Orthopedics;  Laterality: Left;   REMOVAL OF A DIALYSIS CATHETER Right 07/17/2019   Procedure: REMOVAL OF HEMODIALYSIS DIALYSIS CATHETER;  Surgeon: Altamese [place], MD;  Location: [place];  Service: Orthopedics;  Laterality: Right;   TEE WITHOUT CARDIOVERSION N/A 05/25/2019   Procedure: TRANSESOPHAGEAL ECHOCARDIOGRAM (TEE);  Surgeon: Dorothy Spark, MD;  Location: [place];  Service: Cardiovascular;  Laterality: N/A;   TEE WITHOUT CARDIOVERSION N/A 07/19/2019   Procedure: TRANSESOPHAGEAL ECHOCARDIOGRAM (TEE);  Surgeon: Sueanne Margarita, MD;  Location: [facility] ENDOSCOPY;  Service: Cardiovascular;  Laterality: N/A;   TONSILLECTOMY      Family History  Problem Relation Age of Onset   Heart disease Mother    Diabetes Mother    Hypertension Mother    Diabetes Father    Hypertension Father    CAD Mother        "angina"   Hypercholesterolemia Mother    Hypercholesterolemia Father    Healthy Brother     Social History:  reports that he quit smoking about 2  years ago. His smoking use included cigarettes. He has never used smokeless tobacco. He reports that he does not currently use alcohol. He reports that he does not use drugs.  Allergies:  Allergies  Allergen Reactions   Doxycycline Hives   Latex Swelling    Pt reports swelling at site.    Morphine Anxiety    Medications: I have reviewed the patient's current medications.  Results for orders placed or performed during the hospital encounter of 12/09/20 (from the past 48 hour(s))  Comprehensive metabolic panel     Status: Abnormal   Collection Time: 12/09/20  1:57 PM  Result Value Ref  Range   Sodium 138 135 - 145 mmol/L   Potassium 5.4 (H) 3.5 - 5.1 mmol/L   Chloride 103 98 - 111 mmol/L   CO2 23 22 - 32 mmol/L   Glucose, Bld 101 (H) 70 - 99 mg/dL    Comment: Glucose reference range applies only to samples taken after fasting for at least 8 hours.   BUN 65 (H) 6 - 20 mg/dL   Creatinine, Ser 12.68 (H) 0.61 - 1.24 mg/dL   Calcium 9.2 8.9 - 10.3 mg/dL   Total Protein 7.7 6.5 - 8.1 g/dL   Albumin 3.7 3.5 - 5.0 g/dL   AST 15 15 - 41 U/L   ALT 17 0 - 44 U/L   Alkaline Phosphatase 76 38 - 126 U/L   Total Bilirubin 0.9 0.3 - 1.2 mg/dL   GFR, Estimated 5 (L) >60 mL/min    Comment: (NOTE) Calculated using the CKD-EPI Creatinine Equation (2021)    Anion gap 12 5 - 15    Comment: Performed at [facility], [address], [place], [place] [address]  CBC with Differential/Platelet     Status: Abnormal   Collection Time: 12/09/20  1:57 PM  Result Value Ref Range   WBC 9.7 4.0 - 10.5 K/uL   RBC 3.80 (L) 4.22 - 5.81 MIL/uL   Hemoglobin 11.0 (L) 13.0 - 17.0 g/dL   HCT 34.3 (L) 39.0 - 52.0 %   MCV 90.3 80.0 - 100.0 fL   MCH 28.9 26.0 - 34.0 pg   MCHC 32.1 30.0 - 36.0 g/dL   RDW 14.1 11.5 - 15.5 %   Platelets 283 150 - 400 K/uL   nRBC 0.0 0.0 - 0.2 %   Neutrophils Relative % 80 %   Neutro Abs 7.7 1.7 - 7.7 K/uL   Lymphocytes Relative 10 %   Lymphs Abs 1.0 0.7 - 4.0 K/uL   Monocytes  Relative 7 %   Monocytes Absolute 0.7 0.1 - 1.0 K/uL   Eosinophils Relative 2 %   Eosinophils Absolute 0.2 0.0 - 0.5 K/uL   Basophils Relative 1 %   Basophils Absolute 0.1 0.0 - 0.1 K/uL   Immature Granulocytes 0 %   Abs Immature Granulocytes 0.04 0.00 - 0.07 K/uL    Comment: Performed at [facility], [address] [place], [place] [address]  CBG monitoring, ED     Status: None   Collection Time: 12/09/20  2:13 PM  Result Value Ref Range   Glucose-Capillary 97 70 - 99 mg/dL    Comment: Glucose reference range applies only to samples taken after fasting for at least 8 hours.  Resp Panel by RT-PCR (Flu A&B, Covid) Nasopharyngeal Swab     Status: None   Collection Time: 12/09/20  2:42 PM   Specimen: Nasopharyngeal Swab; Nasopharyngeal(NP) swabs in vial transport medium  Result Value Ref Range   SARS Coronavirus 2 by RT PCR NEGATIVE NEGATIVE    Comment: (NOTE) SARS-CoV-2 target nucleic acids are NOT DETECTED.  The SARS-CoV-2 RNA is generally detectable in upper respiratory specimens during the acute phase of infection. The lowest concentration of SARS-CoV-2 viral copies this assay can detect is 138 copies/mL. A negative result does not preclude SARS-Cov-2 infection and should not be used as the sole basis for treatment or other patient management decisions. A negative result may occur with  improper specimen collection/handling, submission of specimen other than nasopharyngeal swab, presence of viral mutation(s) within the areas targeted by this assay, and inadequate number of viral copies(<138  copies/mL). A negative result must be combined with clinical observations, patient history, and epidemiological information. The expected result is Negative.  Fact Sheet for Patients:  EntrepreneurPulse.com.au  Fact Sheet for Healthcare Providers:  IncredibleEmployment.be  This test is no t yet approved or cleared by the [place] FDA and  has been  authorized for detection and/or diagnosis of SARS-CoV-2 by FDA under an Emergency Use Authorization (EUA). This EUA will remain  in effect (meaning this test can be used) for the duration of the COVID-19 declaration under Section 564(b)(1) of the Act, 21 U.S.C.section 360bbb-3(b)(1), unless the authorization is terminated  or revoked sooner.       Influenza A by PCR NEGATIVE NEGATIVE   Influenza B by PCR NEGATIVE NEGATIVE    Comment: (NOTE) The Xpert Xpress SARS-CoV-2/FLU/RSV plus assay is intended as an aid in the diagnosis of influenza from Nasopharyngeal swab specimens and should not be used as a sole basis for treatment. Nasal washings and aspirates are unacceptable for Xpert Xpress SARS-CoV-2/FLU/RSV testing.  Fact Sheet for Patients: EntrepreneurPulse.com.au  Fact Sheet for Healthcare Providers: IncredibleEmployment.be  This test is not yet approved or cleared by the [place] FDA and has been authorized for detection and/or diagnosis of SARS-CoV-2 by FDA under an Emergency Use Authorization (EUA). This EUA will remain in effect (meaning this test can be used) for the duration of the COVID-19 declaration under Section 564(b)(1) of the Act, 21 U.S.C. section 360bbb-3(b)(1), unless the authorization is terminated or revoked.  Performed at [facility], [address] [place], [place] [address]   Magnesium     Status: None   Collection Time: 12/09/20  4:23 PM  Result Value Ref Range   Magnesium 2.4 1.7 - 2.4 mg/dL    Comment: Performed at [facility], [address] [place], [place] [address]  Phosphorus     Status: Abnormal   Collection Time: 12/09/20  4:23 PM  Result Value Ref Range   Phosphorus 5.3 (H) 2.5 - 4.6 mg/dL    Comment: Performed at [facility], [address] [place], [place] [address]  Ethanol     Status: None   Collection Time: 12/09/20  4:23 PM  Result Value Ref Range   Alcohol, Ethyl (B) <10 <10 mg/dL     Comment: Performed at [facility], [address], [place], [place] [address]  MRSA Next Gen by PCR, Nasal     Status: None   Collection Time: 12/09/20 10:02 PM   Specimen: Nasal Mucosa; Nasal Swab  Result Value Ref Range   MRSA by PCR Next Gen NOT DETECTED NOT DETECTED    Comment: (NOTE) The GeneXpert MRSA Assay (FDA approved for NASAL specimens only), is one component of a comprehensive MRSA colonization surveillance program. It is not intended to diagnose MRSA infection nor to guide or monitor treatment for MRSA infections. Test performance is not FDA approved in patients less than 62 years old. Performed at [facility], [address], [place], [place] [address]   Glucose, capillary     Status: None   Collection Time: 12/09/20 10:08 PM  Result Value Ref Range   Glucose-Capillary 93 70 - 99 mg/dL    Comment: Glucose reference range applies only to samples taken after fasting for at least 8 hours.  Glucose, capillary     Status: None   Collection Time: 12/10/20  7:18 AM  Result Value Ref Range   Glucose-Capillary 71 70 - 99 mg/dL    Comment: Glucose reference range applies only  to samples taken after fasting for at least 8 hours.    CT Head Wo Contrast  Result Date: 12/09/2020 CLINICAL DATA:  Seizure. EXAM: CT HEAD WITHOUT CONTRAST TECHNIQUE: Contiguous axial images were obtained from the base of the skull through the vertex without intravenous contrast. COMPARISON:  MR head without contrast 12/20/2019 FINDINGS: Brain: Chronic encephalomalacia again noted left frontal operculum. Remote hemorrhagic infarct noted in the right caudate head. Remote nonhemorrhagic lacunar infarcts again seen in the posterior left cerebellum. No acute infarct, hemorrhage, or mass lesion is present. No significant interval change present. White matter is otherwise within normal limits. The ventricles are of normal size. No significant extraaxial fluid collection is present. Brainstem and cerebellum are  otherwise unremarkable. Vascular: Minimal vascular calcifications are noted within the cavernous internal carotid arteries. No hyperdense vessel is present. Skull: Calvarium is intact. No focal lytic or blastic lesions are present. No significant extracranial soft tissue lesion is present. Sinuses/Orbits: The paranasal sinuses and mastoid air cells are clear. The globes and orbits are within normal limits. IMPRESSION: 1. No acute intracranial abnormality or significant interval change. 2. 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 3. Remote hemorrhagic infarct of the right caudate head. 4. Remote nonhemorrhagic lacunar infarcts of the posterior left cerebellum. Electronically Signed   By: San Morelle M.D.   On: 12/09/2020 15:28    ROS: really all negative Blood pressure (!) 117/59, pulse 79, temperature 98.4 F (36.9 C), temperature source Axillary, resp. rate 12, height 5\' 9"  (1.753 m), weight 104.3 kg, SpO2 100 %. General appearance: slowed mentation Resp: clear to auscultation bilaterally Cardio: regular rate and rhythm, S1, S2 normal, no murmur, click, rub or gallop GI: soft, non-tender; bowel sounds normal; no masses,  no organomegaly Extremities: edema trace to 1+ Neurologic: Mental status: Alert, oriented, thought content appropriate, slightly slowed-  non focal  Right sided TDC-  non tender-  left upper arm AVF-  good thrill and bruit  Assessment/Plan: 45 year old WM with sz d/o and ESRD-  admitted after tonic clonic sz 1 sz-  known hitory ad on keppra/lamictal.  In retrospect possibly wasn't a good idea to transition his HD days giving him 4 days without HD-  might have set him up for a breakthru sz.  On meds and observing 2 ESRD: HD at [place]-  now will be TTS-  will plan for HD today via [facility]-  due to have AVF revision on Friday 3 Hypertension: reasonable control-  lopressor/imdur and lasix-  no changes 4. Anemia of ESRD: will continue ESA 5. Metabolic  Bone Disease: continue home calcitriol and renvela 6. Dispo-  I would think if tolerates HD today and no more sz activity could be discharged prior to his next HD treatment on Thursday   [place] 12/10/2020, 8:45 AM

## 2020-12-10 NOTE — Progress Notes (Signed)
Contacted pt's S/O she wanted to inform care team that pt is due for dialysis. Pt is on a MWF.

## 2020-12-10 NOTE — Progress Notes (Signed)
EEG complete - results pending 
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
## 2020-12-10 NOTE — Progress Notes (Signed)
PROGRESS NOTE   Max Pittman.  IRW:[id number] DOB: 1975/08/30 DOA: 12/09/2020 PCP: Chesley Noon, MD   Chief Complaint  Patient presents with   Seizures   Level of care: Telemetry  Brief Admission History:  45 year old male with known epilepsy, end-stage renal disease on hemodialysis, type 2 diabetes mellitus, hypertension, bipolar disorder, history of DVT, peripheral vascular disease, OSA not on CPAP, cerebrovascular disease status post strokes presented to the emergency department after a witnessed seizure by his wife.  The patient was in the process of having his dialysis days changed and he had gone 4 days without treatment.  He is also having frequent staring spells.  Wife reported a full tonic-clonic seizure after having a brief absence seizure.  According to wife he has been taking his medications as prescribed.  Assessment & Plan:   Principal Problem:   Seizures ([place]) Active Problems:   Diabetes mellitus ([place])   ESRD (end stage renal disease) ( [place])   Essential hypertension   Acute encephalopathy   OSA (obstructive sleep apnea)   DVT (deep venous thrombosis) ([facility])  Epilepsy - likely related to changes in hemodialysis treatments  - EEG ordered  - consult to epileptologist pending - continue seizure precautions - follow up electrolytes closely and replete as needed  - further recommendations to follow  - resume gabapentin after HD today  ESRD  - he is no longer on PD and now receiving HD at [facility]  - I have requested a nephrology consultation and plan is to provide HD today - lasix 40 mg daily resumed from home meds  Essential hypertension - resume home medications and follow  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   Bipolar Disorder - Uncontrolled  - reumed home bupropion, fluoxetine, amitryptyline  - wife says disease is becoming uncontrolled and he is having more freqeuent  outbursts  - he has been having more delusions and psychotic spells at home -  he will need psychiatry consult if he transfers to [facility] - at [facility] we can only offer telepsych TTS services which I am ordering   Chronic hip infection  - wife tells me he has to take cephalexin for life as prophylaxis for infection  - wife says patient has hardware in hip, pins/screws - resume cephalexin 250 mg BID   DVT prophylaxis: SQ heparin  Code Status: full  Family Communication: t/c to wife [name] Disposition: home when medically stabilized  Status is: Inpatient  Remains inpatient appropriate because: he is not medically cleared by neurologist, further inpatient work up in process including possible 24 hour EEG monitoring with transfer to [facility].    Consultants:  Neurology    Procedures:  Hemodialysis   Antimicrobials:  Chronic daily cephalexin 250 mg BID    Subjective: Pt reports that he feels confused, he doesn't know what happened to him   Objective: Vitals:   12/10/20 1315 12/10/20 1316 12/10/20 1330 12/10/20 1345  BP: (!) 155/75  (!) 146/73 (!) 152/81  Pulse: 89  86 87  Resp: (!) 21  13 16   Temp:  98.7 F (37.1 C)    TempSrc:  Oral    SpO2: 97%  96% 98%  Weight:  105 kg    Height:        Intake/Output Summary (Last 24 hours) at 12/10/2020 1515 Last data filed at 12/10/2020 1304 Gross per 24 hour  Intake 100 ml  Output 450 ml  Net -350 ml   Filed Weights   12/09/20 2036 12/10/20 1316  Weight: 104.3 kg 105 kg    Examination:  General exam: chronically ill male, appears older than stated age, calm and comfortable  Respiratory system: Clear to auscultation. Respiratory effort normal. Cardiovascular system: normal S1 & S2 heard. No JVD, murmurs, rubs, gallops or clicks. No pedal edema. Gastrointestinal system: Abdomen is nondistended, soft and nontender. No organomegaly or masses felt. Normal bowel sounds heard. Central nervous system: Alert and oriented. No focal  neurological deficits. Extremities: Symmetric 5 x 5 power. Skin: No rashes, lesions or ulcers Psychiatry: Judgement and insight appear poor. Mood & affect flat.   Data Reviewed: I have personally reviewed following labs and imaging studies  CBC: Recent Labs  Lab 12/09/20 1357  WBC 9.7  NEUTROABS 7.7  HGB 11.0*  HCT 34.3*  MCV 90.3  PLT 338    Basic Metabolic Panel: Recent Labs  Lab 12/09/20 1357 12/09/20 1623  NA 138  --   K 5.4*  --   CL 103  --   CO2 23  --   GLUCOSE 101*  --   BUN 65*  --   CREATININE 12.68*  --   CALCIUM 9.2  --   MG  --  2.4  PHOS  --  5.3*    GFR: Estimated Creatinine Clearance: 8.9 mL/min (A) (by C-G formula based on SCr of 12.68 mg/dL (H)).  Liver Function Tests: Recent Labs  Lab 12/09/20 1357  AST 15  ALT 17  ALKPHOS 76  BILITOT 0.9  PROT 7.7  ALBUMIN 3.7    CBG: Recent Labs  Lab 12/09/20 1413 12/09/20 2208 12/10/20 0718 12/10/20 1115  GLUCAP 97 93 71 74    Recent Results (from the past 240 hour(s))  Resp Panel by RT-PCR (Flu A&B, Covid) Nasopharyngeal Swab     Status: None   Collection Time: 12/09/20  2:42 PM   Specimen: Nasopharyngeal Swab; Nasopharyngeal(NP) swabs in vial transport medium  Result Value Ref Range Status   SARS Coronavirus 2 by RT PCR NEGATIVE NEGATIVE Final    Comment: (NOTE) SARS-CoV-2 target nucleic acids are NOT DETECTED.  The SARS-CoV-2 RNA is generally detectable in upper respiratory specimens during the acute phase of infection. The lowest concentration of SARS-CoV-2 viral copies this assay can detect is 138 copies/mL. A negative result does not preclude SARS-Cov-2 infection and should not be used as the sole basis for treatment or other patient management decisions. A negative result may occur with  improper specimen collection/handling, submission of specimen other than nasopharyngeal swab, presence of viral mutation(s) within the areas targeted by this assay, and inadequate number of  viral copies(<138 copies/mL). A negative result must be combined with clinical observations, patient history, and epidemiological information. The expected result is Negative.  Fact Sheet for Patients:  EntrepreneurPulse.com.au  Fact Sheet for Healthcare Providers:  IncredibleEmployment.be  This test is no t yet approved or cleared by the [place] FDA and  has been authorized for detection and/or diagnosis of SARS-CoV-2 by FDA under an Emergency Use Authorization (EUA). This EUA will remain  in effect (meaning this test can be used) for the duration of the COVID-19 declaration under Section 564(b)(1) of the Act, 21 U.S.C.section 360bbb-3(b)(1), unless the authorization is terminated  or revoked sooner.       Influenza A by PCR NEGATIVE NEGATIVE Final   Influenza B by PCR NEGATIVE NEGATIVE Final    Comment: (NOTE) The Xpert Xpress SARS-CoV-2/FLU/RSV plus assay  is intended as an aid in the diagnosis of influenza from Nasopharyngeal swab specimens and should not be used as a sole basis for treatment. Nasal washings and aspirates are unacceptable for Xpert Xpress SARS-CoV-2/FLU/RSV testing.  Fact Sheet for Patients: EntrepreneurPulse.com.au  Fact Sheet for Healthcare Providers: IncredibleEmployment.be  This test is not yet approved or cleared by the [place] FDA and has been authorized for detection and/or diagnosis of SARS-CoV-2 by FDA under an Emergency Use Authorization (EUA). This EUA will remain in effect (meaning this test can be used) for the duration of the COVID-19 declaration under Section 564(b)(1) of the Act, 21 U.S.C. section 360bbb-3(b)(1), unless the authorization is terminated or revoked.  Performed at [facility], [address], [place], [place] [address]   MRSA Next Gen by PCR, Nasal     Status: None   Collection Time: 12/09/20 10:02 PM   Specimen: Nasal Mucosa; Nasal Swab   Result Value Ref Range Status   MRSA by PCR Next Gen NOT DETECTED NOT DETECTED Final    Comment: (NOTE) The GeneXpert MRSA Assay (FDA approved for NASAL specimens only), is one component of a comprehensive MRSA colonization surveillance program. It is not intended to diagnose MRSA infection nor to guide or monitor treatment for MRSA infections. Test performance is not FDA approved in patients less than 31 years old. Performed at [facility], [address] [place], [place] [address]      Radiology Studies: CT Head Wo Contrast  Result Date: 12/09/2020 CLINICAL DATA:  Seizure. EXAM: CT HEAD WITHOUT CONTRAST TECHNIQUE: Contiguous axial images were obtained from the base of the skull through the vertex without intravenous contrast. COMPARISON:  MR head without contrast 12/20/2019 FINDINGS: Brain: Chronic encephalomalacia again noted left frontal operculum. Remote hemorrhagic infarct noted in the right caudate head. Remote nonhemorrhagic lacunar infarcts again seen in the posterior left cerebellum. No acute infarct, hemorrhage, or mass lesion is present. No significant interval change present. White matter is otherwise within normal limits. The ventricles are of normal size. No significant extraaxial fluid collection is present. Brainstem and cerebellum are otherwise unremarkable. Vascular: Minimal vascular calcifications are noted within the cavernous internal carotid arteries. No hyperdense vessel is present. Skull: Calvarium is intact. No focal lytic or blastic lesions are present. No significant extracranial soft tissue lesion is present. Sinuses/Orbits: The paranasal sinuses and mastoid air cells are clear. The globes and orbits are within normal limits. IMPRESSION: 1. No acute intracranial abnormality or significant interval change. 2. 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 3. Remote hemorrhagic infarct of the right caudate head. 4. Remote nonhemorrhagic lacunar infarcts  of the posterior left cerebellum. Electronically Signed   By: San Morelle M.D.   On: 12/09/2020 15:28   EEG adult  Result Date: 12/10/2020 Lora Havens, MD     12/10/2020  1:11 PM Patient Name: Max Pittman. MRN: [id number] Epilepsy Attending: Lora Havens Referring Physician/Provider: Bethena Roys, MD Date: 12/10/2020 Duration: 22.14 mins Patient history: 45 year old male with history of epilepsy who presented with breakthrough seizure.  EEG evaluate for seizures. Level of alertness: Awake AEDs during EEG study: LEV, LTG Technical aspects: This EEG study was done with scalp electrodes positioned according to the 10-20 International system of electrode placement. Electrical activity was acquired at a sampling rate of 500Hz  and reviewed with a high frequency filter of 70Hz  and a low frequency filter of 1Hz . EEG data were recorded continuously and digitally stored. Description: No clear posterior dominant rhythm was  seen.  EEG showed continuous generalized predominantly 5 to 7 Hz theta slowing as well as intermittent generalized 2 to 3 Hz delta slowing. Hyperventilation and photic stimulation were not performed.   ABNORMALITY - Continuous slow, generalized IMPRESSION: This study is suggestive of moderate diffuse encephalopathy, nonspecific etiology. No seizures or epileptiform discharges were seen throughout the recording. Priyanka Barbra Sarks    Scheduled Meds:  amitriptyline  75 mg Oral QHS   buPROPion  150 mg Oral BID WC   calcitRIOL  0.25 mcg Oral Q T,Th,Sa-HD   Chlorhexidine Gluconate Cloth  6 each Topical Daily   Chlorhexidine Gluconate Cloth  6 each Topical Q0600   darbepoetin (ARANESP) injection - DIALYSIS  60 mcg Intravenous Q Tue-HD   FLUoxetine  10 mg Oral Daily   furosemide  40 mg Oral Daily   heparin  5,000 Units Subcutaneous Q8H   heparin sodium (porcine)       isosorbide mononitrate  15 mg Oral Daily   [START ON 12/11/2020] lamoTRIgine  100 mg Oral q morning    lamoTRIgine  125 mg Oral QHS   levETIRAcetam  500 mg Oral Daily   metoprolol tartrate  12.5 mg Oral BID   multivitamin  1 tablet Oral QHS   pantoprazole  40 mg Oral Daily   ranolazine  500 mg Oral BID   sevelamer carbonate  1,600 mg Oral TID WC   Continuous Infusions:   LOS: 0 days   Time spent: 32 minutes  Mava Suares Wynetta Emery, MD How to contact the [facility] Attending or Consulting provider [place] or covering provider during after hours [place], for this patient?  Check the care team in [facility] and look for a) attending/consulting TR[facility] provider listed and b) the [facility] team listed Log into [email or web address] and use [place]'s universal password to access. If you do not have the password, please contact the hospital operator. Locate the [facility] provider you are looking for under [facility] and page to a number that you can be directly reached. If you still have difficulty reaching the provider, please page the [facility] (Director on Call) for the Hospitalists listed on amion for assistance.  12/10/2020, 3:15 PM

## 2020-12-10 NOTE — Procedures (Signed)
Patient Name: Yehuda Printup.  MRN: [id number]  Epilepsy Attending: Lora Havens  Referring Physician/Provider: Bethena Roys, MD Date: 12/10/2020 Duration: 22.14 mins  Patient history: 45 year old male with history of epilepsy who presented with breakthrough seizure.  EEG evaluate for seizures.  Level of alertness: Awake  AEDs during EEG study: LEV, LTG  Technical aspects: This EEG study was done with scalp electrodes positioned according to the 10-20 International system of electrode placement. Electrical activity was acquired at a sampling rate of 500Hz  and reviewed with a high frequency filter of 70Hz  and a low frequency filter of 1Hz . EEG data were recorded continuously and digitally stored.   Description: No clear posterior dominant rhythm was seen.  EEG showed continuous generalized predominantly 5 to 7 Hz theta slowing as well as intermittent generalized 2 to 3 Hz delta slowing. Hyperventilation and photic stimulation were not performed.     ABNORMALITY - Continuous slow, generalized  IMPRESSION: This study is suggestive of moderate diffuse encephalopathy, nonspecific etiology. No seizures or epileptiform discharges were seen throughout the recording.  Niko Penson Barbra Sarks

## 2020-12-11 DIAGNOSIS — N186 End stage renal disease: Secondary | ICD-10-CM | POA: Diagnosis not present

## 2020-12-11 DIAGNOSIS — G9341 Metabolic encephalopathy: Secondary | ICD-10-CM

## 2020-12-11 LAB — URINALYSIS, COMPLETE (UACMP) WITH MICROSCOPIC
Bacteria, UA: NONE SEEN
Bilirubin Urine: NEGATIVE
Glucose, UA: 50 mg/dL — AB
Hgb urine dipstick: NEGATIVE
Ketones, ur: NEGATIVE mg/dL
Leukocytes,Ua: NEGATIVE
Nitrite: NEGATIVE
Protein, ur: >=300 mg/dL — AB
Specific Gravity, Urine: 1.011 (ref 1.005–1.030)
pH: 8 (ref 5.0–8.0)

## 2020-12-11 LAB — GLUCOSE, CAPILLARY
Glucose-Capillary: 66 mg/dL — ABNORMAL LOW (ref 70–99)
Glucose-Capillary: 73 mg/dL (ref 70–99)
Glucose-Capillary: 103 mg/dL — ABNORMAL HIGH (ref 70–99)
Glucose-Capillary: 69 mg/dL — ABNORMAL LOW (ref 70–99)

## 2020-12-11 LAB — RAPID URINE DRUG SCREEN, HOSP PERFORMED
Amphetamines: NOT DETECTED
Barbiturates: NOT DETECTED
Benzodiazepines: NOT DETECTED
Cocaine: NOT DETECTED
Opiates: NOT DETECTED
Tetrahydrocannabinol: NOT DETECTED

## 2020-12-11 LAB — VITAMIN B12: Vitamin B-12: 326 pg/mL (ref 180–914)

## 2020-12-11 LAB — FOLATE: Folate: 41.5 ng/mL (ref >5.9)

## 2020-12-11 LAB — TSH: TSH: 1.735 u[IU]/mL (ref 0.350–4.500)

## 2020-12-11 LAB — AMMONIA: Ammonia: 20 umol/L (ref 9–35)

## 2020-12-11 LAB — T4, FREE: Free T4: 0.9 ng/dL (ref 0.61–1.12)

## 2020-12-11 MED ORDER — CHLORHEXIDINE GLUCONATE CLOTH 2 % EX PADS
6.0000 | MEDICATED_PAD | Freq: Every day | CUTANEOUS | Status: DC
  Administered 2020-12-12 – 2020-12-13 (×2): 6 via TOPICAL

## 2020-12-11 MED ORDER — METOPROLOL TARTRATE 25 MG PO TABS
25.0000 mg | ORAL_TABLET | Freq: Two times a day (BID) | ORAL | Status: DC
  Administered 2020-12-11 – 2020-12-13 (×3): 25 mg via ORAL
  Filled 2020-12-11 (×3): qty 1

## 2020-12-11 NOTE — Progress Notes (Signed)
PROGRESS NOTE  Max Pittman. NUU:[id number] DOB: Mar 22, 1975 DOA: 12/09/2020 PCP: Chesley Noon, MD  Brief History:  45 year old male with known epilepsy, end-stage renal disease on hemodialysis, type 2 diabetes mellitus, hypertension, bipolar disorder, history of DVT, peripheral vascular disease, OSA not on CPAP, cerebrovascular disease status post strokes presented to the emergency department after a witnessed seizure by his wife.  The patient was in the process of having his dialysis days changed and he had gone 4 days without treatment.  He is also having frequent staring spells.  Wife reported a full tonic-clonic seizure after having a brief episode of staring.  According to wife he has been taking his medications as prescribed.  Assessment/Plan: Epilepsy - likely related to changes in hemodialysis treatments  - EEG ordered--no seizure activity - consult to epileptologist--appreciate Dr. Hortense Ramal - discontinue buproprion - continue seizure precautions - follow up electrolytes closely and replete as needed  - further recommendations to follow  - resume gabapentin after HD today  Acute metabolic Encephalopathy -improving, but remains confused -likely multifactorial -UA -B12 -folate -TSH -UDS   ESRD  - he is no longer on PD and now receiving HD at [place]  - appreciate nephrology consultation - lasix 40 mg daily resumed from home meds   Essential hypertension - resume home medications and follow   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    Bipolar Disorder - Uncontrolled  - reumed home bupropion, fluoxetine, amitryptyline  - wife says disease is becoming uncontrolled and he is having more freqeuent outbursts  - he has been having more delusions and psychotic spells at home -  he will need psychiatry consult if he transfers to [facility] - consult TTS   Chronic hip infection  - wife tells me  he has to take cephalexin for life as prophylaxis for infection  - wife says patient has hardware in hip, pins/screws - resume cephalexin 250 mg BID         Status is: Inpatient  Remains inpatient appropriate because: remains confused, needs continued workup        Family Communication:   spouse updated at bedside 11/16  Consultants:  renal, neurology  Code Status:  FULL   DVT Prophylaxis:  [place] Heparin    Procedures: As Listed in Progress Note Above  Antibiotics: None  Total time spent 35 minutes.  Greater than 50% spent face to face counseling and coordinating care.     Subjective: Patient denies fevers, chills, headache, chest pain, dyspnea, nausea, vomiting, diarrhea, abdominal pain, dysuria, hematuria, hematochezia, and melena.   Objective: Vitals:   12/10/20 2000 12/10/20 2023 12/11/20 0223 12/11/20 0652  BP:  (!) 175/88 (!) 159/92 (!) 180/98  Pulse:  93 91 90  Resp:  20 16 18   Temp: 100.2 F (37.9 C) 98.7 F (37.1 C) 98.5 F (36.9 C) 97.8 F (36.6 C)  TempSrc: Oral Oral Oral   SpO2:  100% 96% 95%  Weight:      Height:        Intake/Output Summary (Last 24 hours) at 12/11/2020 1005 Last data filed at 12/10/2020 1716 Gross per 24 hour  Intake --  Output 3450 ml  Net -3450 ml   Weight change: 0.7 kg Exam:  General:  Pt is alert, follows commands appropriately, not in acute distress HEENT: No icterus, No thrush, No neck mass, [place]/AT Cardiovascular:  RRR, S1/S2, no rubs, no gallops Respiratory: CTA bilaterally, no wheezing, no crackles, no rhonchi Abdomen: Soft/+BS, non tender, non distended, no guarding Extremities: No edema, No lymphangitis, No petechiae, No rashes, no synovitis   Data Reviewed: I have personally reviewed following labs and imaging studies Basic Metabolic Panel: Recent Labs  Lab 12/09/20 1357 12/09/20 1623  NA 138  --   K 5.4*  --   CL 103  --   CO2 23  --   GLUCOSE 101*  --   BUN 65*  --   CREATININE 12.68*   --   CALCIUM 9.2  --   MG  --  2.4  PHOS  --  5.3*   Liver Function Tests: Recent Labs  Lab 12/09/20 1357  AST 15  ALT 17  ALKPHOS 76  BILITOT 0.9  PROT 7.7  ALBUMIN 3.7   No results for input(s): LIPASE, AMYLASE in the last 168 hours. No results for input(s): AMMONIA in the last 168 hours. Coagulation Profile: No results for input(s): INR, PROTIME in the last 168 hours. CBC: Recent Labs  Lab 12/09/20 1357  WBC 9.7  NEUTROABS 7.7  HGB 11.0*  HCT 34.3*  MCV 90.3  PLT 283   Cardiac Enzymes: No results for input(s): CKTOTAL, CKMB, CKMBINDEX, TROPONINI in the last 168 hours. BNP: Invalid input(s): POCBNP CBG: Recent Labs  Lab 12/09/20 2208 12/10/20 0718 12/10/20 1115 12/11/20 0530 12/11/20 0743  GLUCAP 93 71 74 66* 73   HbA1C: No results for input(s): HGBA1C in the last 72 hours. Urine analysis:    Component Value Date/Time   COLORURINE YELLOW 03/07/2020 [place] 03/07/2020 0241   LABSPEC 1.015 03/07/2020 0241   PHURINE 8.5 (H) 03/07/2020 0241   GLUCOSEU >=500 (A) 03/07/2020 0241   HGBUR SMALL (A) 03/07/2020 0241   BILIRUBINUR NEGATIVE 03/07/2020 0241   KETONESUR NEGATIVE 03/07/2020 0241   PROTEINUR >300 (A) 03/07/2020 0241   NITRITE NEGATIVE 03/07/2020 0241   LEUKOCYTESUR NEGATIVE 03/07/2020 0241   Sepsis Labs: @LABRCNTIP (procalcitonin:4,lacticidven:4) ) Recent Results (from the past 240 hour(s))  Resp Panel by RT-PCR (Flu A&B, Covid) Nasopharyngeal Swab     Status: None   Collection Time: 12/09/20  2:42 PM   Specimen: Nasopharyngeal Swab; Nasopharyngeal(NP) swabs in vial transport medium  Result Value Ref Range Status   SARS Coronavirus 2 by RT PCR NEGATIVE NEGATIVE Final    Comment: (NOTE) SARS-CoV-2 target nucleic acids are NOT DETECTED.  The SARS-CoV-2 RNA is generally detectable in upper respiratory specimens during the acute phase of infection. The lowest concentration of SARS-CoV-2 viral copies this assay can detect  is 138 copies/mL. A negative result does not preclude SARS-Cov-2 infection and should not be used as the sole basis for treatment or other patient management decisions. A negative result may occur with  improper specimen collection/handling, submission of specimen other than nasopharyngeal swab, presence of viral mutation(s) within the areas targeted by this assay, and inadequate number of viral copies(<138 copies/mL). A negative result must be combined with clinical observations, patient history, and epidemiological information. The expected result is Negative.  Fact Sheet for Patients:  EntrepreneurPulse.com.au  Fact Sheet for Healthcare Providers:  IncredibleEmployment.be  This test is no t yet approved or cleared by the [place] FDA and  has been authorized for detection and/or diagnosis of SARS-CoV-2 by FDA under an Emergency Use Authorization (EUA). This EUA will remain  in effect (meaning this test can be used) for the duration of the COVID-19 declaration under  Section 564(b)(1) of the Act, 21 U.S.C.section 360bbb-3(b)(1), unless the authorization is terminated  or revoked sooner.       Influenza A by PCR NEGATIVE NEGATIVE Final   Influenza B by PCR NEGATIVE NEGATIVE Final    Comment: (NOTE) The Xpert Xpress SARS-CoV-2/FLU/RSV plus assay is intended as an aid in the diagnosis of influenza from Nasopharyngeal swab specimens and should not be used as a sole basis for treatment. Nasal washings and aspirates are unacceptable for Xpert Xpress SARS-CoV-2/FLU/RSV testing.  Fact Sheet for Patients: EntrepreneurPulse.com.au  Fact Sheet for Healthcare Providers: IncredibleEmployment.be  This test is not yet approved or cleared by the [place] FDA and has been authorized for detection and/or diagnosis of SARS-CoV-2 by FDA under an Emergency Use Authorization (EUA). This EUA will remain in effect  (meaning this test can be used) for the duration of the COVID-19 declaration under Section 564(b)(1) of the Act, 21 U.S.C. section 360bbb-3(b)(1), unless the authorization is terminated or revoked.  Performed at [facility], [address] [place], [place] [address]   MRSA Next Gen by PCR, Nasal     Status: None   Collection Time: 12/09/20 10:02 PM   Specimen: Nasal Mucosa; Nasal Swab  Result Value Ref Range Status   MRSA by PCR Next Gen NOT DETECTED NOT DETECTED Final    Comment: (NOTE) The GeneXpert MRSA Assay (FDA approved for NASAL specimens only), is one component of a comprehensive MRSA colonization surveillance program. It is not intended to diagnose MRSA infection nor to guide or monitor treatment for MRSA infections. Test performance is not FDA approved in patients less than 5 years old. Performed at [facility], [address] [place], [place] [address]      Scheduled Meds:  amitriptyline  75 mg Oral QHS   calcitRIOL  0.25 mcg Oral Q T,Th,Sa-HD   cephALEXin  250 mg Oral Q12H   Chlorhexidine Gluconate Cloth  6 each Topical Daily   Chlorhexidine Gluconate Cloth  6 each Topical Q0600   Chlorhexidine Gluconate Cloth  6 each Topical Q0600   darbepoetin (ARANESP) injection - DIALYSIS  60 mcg Intravenous Q Tue-HD   FLUoxetine  10 mg Oral Daily   furosemide  40 mg Oral Daily   gabapentin  300 mg Oral TID   heparin  5,000 Units Subcutaneous Q8H   isosorbide mononitrate  15 mg Oral Daily   lamoTRIgine  100 mg Oral q morning   lamoTRIgine  125 mg Oral QHS   levETIRAcetam  500 mg Oral Daily   metoprolol tartrate  25 mg Oral BID   multivitamin  1 tablet Oral QHS   pantoprazole  40 mg Oral Daily   ranolazine  500 mg Oral BID   sevelamer carbonate  1,600 mg Oral TID WC   Continuous Infusions:  Procedures/Studies: CT Head Wo Contrast  Result Date: 12/09/2020 CLINICAL DATA:  Seizure. EXAM: CT HEAD WITHOUT CONTRAST TECHNIQUE: Contiguous axial images were obtained from the  base of the skull through the vertex without intravenous contrast. COMPARISON:  MR head without contrast 12/20/2019 FINDINGS: Brain: Chronic encephalomalacia again noted left frontal operculum. Remote hemorrhagic infarct noted in the right caudate head. Remote nonhemorrhagic lacunar infarcts again seen in the posterior left cerebellum. No acute infarct, hemorrhage, or mass lesion is present. No significant interval change present. White matter is otherwise within normal limits. The ventricles are of normal size. No significant extraaxial fluid collection is present. Brainstem and cerebellum are otherwise unremarkable. Vascular: Minimal vascular calcifications are noted within  the cavernous internal carotid arteries. No hyperdense vessel is present. Skull: Calvarium is intact. No focal lytic or blastic lesions are present. No significant extracranial soft tissue lesion is present. Sinuses/Orbits: The paranasal sinuses and mastoid air cells are clear. The globes and orbits are within normal limits. IMPRESSION: 1. No acute intracranial abnormality or significant interval change. 2. 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 3. Remote hemorrhagic infarct of the right caudate head. 4. Remote nonhemorrhagic lacunar infarcts of the posterior left cerebellum. Electronically Signed   By: San Morelle M.D.   On: 12/09/2020 15:28   PERIPHERAL VASCULAR CATHETERIZATION  Result Date: 11/26/2020 Images from the original result were not included. Patient name: Max Pittman. MRN: [id number] DOB: [birth year] Sex: male 11/26/2020 Pre-operative Diagnosis: End-stage renal disease Post-operative diagnosis:  Same Surgeon:  Annamarie Major Procedure Performed:  1.  Ultrasound-guided access, left basilic vein  2.  Fistulogram  3.  Conscious sedation, 18 minutes  Indications: The patient has previously undergone a basilic vein fistula creation which has been difficult to cannulate.  He is here for fistulogram.  Procedure:  The patient was identified in the holding area and taken to room 8.  The patient was then placed supine on the table and prepped and draped in the usual sterile fashion.  A time out was called.  Conscious sedation was administered with the use of IV fentanyl and Versed under continuous physician and nurse monitoring.  Heart rate, blood pressure, and oxygen saturations were continuously monitored.  Total sedation time was 18 minutes ultrasound was used to evaluate the fistula.  The vein was patent and compressible.  A digital ultrasound image was acquired.  The fistula was then accessed under ultrasound guidance using a micropuncture needle.  An 018 wire was then asvanced without resistance and a micropuncture sheath was placed.  Contrast injections were then performed through the sheath. Findings: The central venous system is widely patent.  The arterial venous anastomosis is widely patent.  No stenosis is identified within the basilic vein fistula.  There is a large collateral up in the axilla that feeds into the central venous system.  Manual pressure was held for hemostasis.  Intervention: None Impression:  #1  No central venous stenosis or anastomotic stenosis.  The fistula is widely patent  #2  There is a large branch originating at the midportion of the proximal incision in the axilla.  I have recommended ligation of this branch and potentially elevation of the fistula in the operating room in the near future. Theotis Burrow, M.D., [facility] [employer] Office: [phone] Pager:  [phone]   EEG adult  Result Date: 12/10/2020 Lora Havens, MD     12/10/2020  1:11 PM Patient Name: Max Pittman. MRN: [id number] Epilepsy Attending: Lora Havens Referring Physician/Provider: Bethena Roys, MD Date: 12/10/2020 Duration: 22.14 mins Patient history: 45 year old male with history of epilepsy who presented with breakthrough seizure.  EEG evaluate  for seizures. Level of alertness: Awake AEDs during EEG study: LEV, LTG Technical aspects: This EEG study was done with scalp electrodes positioned according to the 10-20 International system of electrode placement. Electrical activity was acquired at a sampling rate of 500Hz  and reviewed with a high frequency filter of 70Hz  and a low frequency filter of 1Hz . EEG data were recorded continuously and digitally stored. Description: No clear posterior dominant rhythm was seen.  EEG showed continuous generalized predominantly 5 to 7 Hz theta slowing as well as  intermittent generalized 2 to 3 Hz delta slowing. Hyperventilation and photic stimulation were not performed.   ABNORMALITY - Continuous slow, generalized IMPRESSION: This study is suggestive of moderate diffuse encephalopathy, nonspecific etiology. No seizures or epileptiform discharges were seen throughout the recording. Lora Havens   CUP PACEART REMOTE DEVICE CHECK  Result Date: 12/02/2020 ILR summary report received. Battery status OK. Normal device function. No new symptom, tachy, brady, or pause episodes. No new AF episodes. Monthly summary reports and ROV/PRN LR   Orson Eva, DO  [facility]  If 7PM-7AM, please contact night-coverage [email or web address] Password [email or web address] 12/11/2020, 10:05 AM   LOS: 1 day

## 2020-12-11 NOTE — Plan of Care (Signed)

## 2020-12-11 NOTE — Progress Notes (Addendum)
Pt has been cleared from psych per Pecolia Ades, NP and per Dr. Dwyane Dee no medication changes have been made. Pt will now be removed from the [facility] shift report. TOC will follow for pt's discharge needs due to pt being admitted on a medical unit currently. Pt now removed from [facility] shift report.  Benjaman Kindler, MSW, LCSWA 12/11/2020 9:02 PM

## 2020-12-11 NOTE — Consult Note (Signed)
Max Pittman, Jr. Is 45 year old patient admitted on 11/14 after witnessed seizure activity by his wife.   Per admit note:  Max Misuraca. is a 45 y.o. 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   Level 5 caveat for postictal state.   I spoke with patient's significant other, Max Pittman.  She indicated that patient woke up feeling well, they had breakfast and then lunch.  Soon after she heard a loud noise, and when she went to check on the patient he was having a blank stare.  The seizure then progressed to generalized tonic-clonic activity which is not normal for him.   Patient has been taking his medications as prescribed Currently patient is extremely somnolent.  Requiring sternal rub for him to open his eyes.  When he is awake, he informs me that he is unsure where he is.  He does not recall having a seizure.  He has no complaints at this time.    Consult placed for psychiatry to review this patient's medications. Dr. [name] Abbot reviewed medications and does not recommend changes to current medications and does not recommend adding antipsychotic medications due this patient's co morbidities.   This provider discussed the above recommendation with Dr. Shanon Brow Tat via secure chat who agrees that TTS/psychiatry does not need to do an assessment.    Recommendation: per Dr. [name] Abbot, no medication changes will be made per psychiatry.

## 2020-12-11 NOTE — Progress Notes (Signed)
Subjective:  s/p HD yest-  removed 3 liters and tolerated well-  moved out of ICU-  neuro saw him-  increased lamotrigine-  suggested  transfer to [facility] today if not back to baseline or further sz activity-  situation complicated by his bipolar d/o as well.  It took me a while to wake him up today-  sugar had been low-  initially confused but then better  Objective Vital signs in last 24 hours: Vitals:   12/10/20 2000 12/10/20 2023 12/11/20 0223 12/11/20 0652  BP:  (!) 175/88 (!) 159/92 (!) 180/98  Pulse:  93 91 90  Resp:  20 16 18   Temp: 100.2 F (37.9 C) 98.7 F (37.1 C) 98.5 F (36.9 C) 97.8 F (36.6 C)  TempSrc: Oral Oral Oral   SpO2:  100% 96% 95%  Weight:      Height:       Weight change: 0.7 kg  Intake/Output Summary (Last 24 hours) at 12/11/2020 2992 Last data filed at 12/10/2020 1716 Gross per 24 hour  Intake --  Output 3450 ml  Net -3450 ml    Dialyzes at [facility]-  was MWF-  now to be TTS   EDW 104.5. HD [place] 2/2.5, Dialyzer 300 rev, Heparin 1200 load , then 500 per hour. Access [facility]-  has left upper arm AVF-  was due to have revision on Friday.  600 of EPO- calcitriol 0.25 and 50 venofer per week     Assessment/Plan: 45 year old WM with sz d/o and ESRD-  admitted after tonic clonic sz 1 sz-  known hitory ad on keppra/lamictal.  In retrospect possibly wasn't a good idea to transition his HD days giving him 4 days without HD-  might have set him up for a breakthru sz.  On meds and observing-  neuro saw and adjusted meds 2 ESRD: HD at [name]-  now will be TTS-  will plan for HD tomorrow via [facility] if still here-  due to have AVF revision on Friday-  may need to be rescheduled if still here 3 Hypertension: marginal control-  lopressor/imdur and lasix-   will increase his lopressor  4. Anemia of ESRD: will continue ESA 5. Metabolic Bone Disease: continue home calcitriol and renvela 6. Dispo-  per primary and neurology-  no more sz but MS is off but difficult  to tell how much his psychiatric dz is contributing   [name]    Labs: Basic Metabolic Panel: Recent Labs  Lab 12/09/20 1357 12/09/20 1623  NA 138  --   K 5.4*  --   CL 103  --   CO2 23  --   GLUCOSE 101*  --   BUN 65*  --   CREATININE 12.68*  --   CALCIUM 9.2  --   PHOS  --  5.3*   Liver Function Tests: Recent Labs  Lab 12/09/20 1357  AST 15  ALT 17  ALKPHOS 76  BILITOT 0.9  PROT 7.7  ALBUMIN 3.7   No results for input(s): LIPASE, AMYLASE in the last 168 hours. No results for input(s): AMMONIA in the last 168 hours. CBC: Recent Labs  Lab 12/09/20 1357  WBC 9.7  NEUTROABS 7.7  HGB 11.0*  HCT 34.3*  MCV 90.3  PLT 283   Cardiac Enzymes: No results for input(s): CKTOTAL, CKMB, CKMBINDEX, TROPONINI in the last 168 hours. CBG: Recent Labs  Lab 12/09/20 2208 12/10/20 0718 12/10/20 1115 12/11/20 0530 12/11/20 0743  GLUCAP  93 71 74 66* 73    Iron Studies: No results for input(s): IRON, TIBC, TRANSFERRIN, FERRITIN in the last 72 hours. Studies/Results: CT Head Wo Contrast  Result Date: 12/09/2020 CLINICAL DATA:  Seizure. EXAM: CT HEAD WITHOUT CONTRAST TECHNIQUE: Contiguous axial images were obtained from the base of the skull through the vertex without intravenous contrast. COMPARISON:  MR head without contrast 12/20/2019 FINDINGS: Brain: Chronic encephalomalacia again noted left frontal operculum. Remote hemorrhagic infarct noted in the right caudate head. Remote nonhemorrhagic lacunar infarcts again seen in the posterior left cerebellum. No acute infarct, hemorrhage, or mass lesion is present. No significant interval change present. White matter is otherwise within normal limits. The ventricles are of normal size. No significant extraaxial fluid collection is present. Brainstem and cerebellum are otherwise unremarkable. Vascular: Minimal vascular calcifications are noted within the cavernous internal carotid arteries. No hyperdense vessel is  present. Skull: Calvarium is intact. No focal lytic or blastic lesions are present. No significant extracranial soft tissue lesion is present. Sinuses/Orbits: The paranasal sinuses and mastoid air cells are clear. The globes and orbits are within normal limits. IMPRESSION: 1. No acute intracranial abnormality or significant interval change. 2. 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 3. Remote hemorrhagic infarct of the right caudate head. 4. Remote nonhemorrhagic lacunar infarcts of the posterior left cerebellum. Electronically Signed   By: San Morelle M.D.   On: 12/09/2020 15:28   EEG adult  Result Date: 12/10/2020 Lora Havens, MD     12/10/2020  1:11 PM Patient Name: Lilia Pro. MRN: [id number] Epilepsy Attending: Lora Havens Referring Physician/Provider: Bethena Roys, MD Date: 12/10/2020 Duration: 22.14 mins Patient history: 45 year old male with history of epilepsy who presented with breakthrough seizure.  EEG evaluate for seizures. Level of alertness: Awake AEDs during EEG study: LEV, LTG Technical aspects: This EEG study was done with scalp electrodes positioned according to the 10-20 International system of electrode placement. Electrical activity was acquired at a sampling rate of 500Hz  and reviewed with a high frequency filter of 70Hz  and a low frequency filter of 1Hz . EEG data were recorded continuously and digitally stored. Description: No clear posterior dominant rhythm was seen.  EEG showed continuous generalized predominantly 5 to 7 Hz theta slowing as well as intermittent generalized 2 to 3 Hz delta slowing. Hyperventilation and photic stimulation were not performed.   ABNORMALITY - Continuous slow, generalized IMPRESSION: This study is suggestive of moderate diffuse encephalopathy, nonspecific etiology. No seizures or epileptiform discharges were seen throughout the recording. Priyanka Barbra Sarks   Medications: Infusions:   Scheduled  Medications:  amitriptyline  75 mg Oral QHS   buPROPion  150 mg Oral BID WC   calcitRIOL  0.25 mcg Oral Q T,Th,Sa-HD   cephALEXin  250 mg Oral Q12H   Chlorhexidine Gluconate Cloth  6 each Topical Daily   Chlorhexidine Gluconate Cloth  6 each Topical Q0600   darbepoetin (ARANESP) injection - DIALYSIS  60 mcg Intravenous Q Tue-HD   FLUoxetine  10 mg Oral Daily   furosemide  40 mg Oral Daily   gabapentin  300 mg Oral TID   heparin  5,000 Units Subcutaneous Q8H   isosorbide mononitrate  15 mg Oral Daily   lamoTRIgine  100 mg Oral q morning   lamoTRIgine  125 mg Oral QHS   levETIRAcetam  500 mg Oral Daily   metoprolol tartrate  12.5 mg Oral BID   multivitamin  1 tablet Oral QHS   pantoprazole  40  mg Oral Daily   ranolazine  500 mg Oral BID   sevelamer carbonate  1,600 mg Oral TID WC    have reviewed scheduled and prn medications.  Physical Exam: General:  took me a long time to wake up-  sweaty-  nursing told me sugar was in the 70's-  being set up for breakfast Heart: tachy Lungs: mostly clear Abdomen: soft, non tender Extremities: minimal peripheral edema Dialysis Access: [facility] and  left upper arm AVF-  maturing -  due for revision on 11/18   12/11/2020,8:22 AM  LOS: 1 day

## 2020-12-12 DIAGNOSIS — I10 Essential (primary) hypertension: Secondary | ICD-10-CM | POA: Diagnosis not present

## 2020-12-12 DIAGNOSIS — N186 End stage renal disease: Secondary | ICD-10-CM | POA: Diagnosis not present

## 2020-12-12 LAB — RENAL FUNCTION PANEL
Albumin: 3.5 g/dL (ref 3.5–5.0)
Anion gap: 13 (ref 5–15)
BUN: 54 mg/dL — ABNORMAL HIGH (ref 6–20)
CO2: 25 mmol/L (ref 22–32)
Calcium: 9.6 mg/dL (ref 8.9–10.3)
Chloride: 101 mmol/L (ref 98–111)
Creatinine, Ser: 12.57 mg/dL — ABNORMAL HIGH (ref 0.61–1.24)
GFR, Estimated: 5 mL/min — ABNORMAL LOW (ref >60)
Glucose, Bld: 77 mg/dL (ref 70–99)
Phosphorus: 5.5 mg/dL — ABNORMAL HIGH (ref 2.5–4.6)
Potassium: 4.9 mmol/L (ref 3.5–5.1)
Sodium: 139 mmol/L (ref 135–145)

## 2020-12-12 LAB — GLUCOSE, CAPILLARY
Glucose-Capillary: 60 mg/dL — ABNORMAL LOW (ref 70–99)
Glucose-Capillary: 81 mg/dL (ref 70–99)
Glucose-Capillary: 93 mg/dL (ref 70–99)

## 2020-12-12 LAB — CBC
HCT: 36.7 % — ABNORMAL LOW (ref 39.0–52.0)
Hemoglobin: 11.4 g/dL — ABNORMAL LOW (ref 13.0–17.0)
MCH: 28.2 pg (ref 26.0–34.0)
MCHC: 31.1 g/dL (ref 30.0–36.0)
MCV: 90.8 fL (ref 80.0–100.0)
Platelets: 246 10*3/uL (ref 150–400)
RBC: 4.04 MIL/uL — ABNORMAL LOW (ref 4.22–5.81)
RDW: 13.8 % (ref 11.5–15.5)
WBC: 8.4 10*3/uL (ref 4.0–10.5)
nRBC: 0 % (ref 0.0–0.2)

## 2020-12-12 LAB — URINE CULTURE: Culture: NO GROWTH

## 2020-12-12 LAB — HEPATITIS B E ANTIBODY: Hep B E Ab: NEGATIVE

## 2020-12-12 MED ORDER — HEPARIN SODIUM (PORCINE) 1000 UNIT/ML IJ SOLN
INTRAMUSCULAR | Status: AC
  Administered 2020-12-12: 14:00:00 3800 [IU]
  Filled 2020-12-12: qty 4

## 2020-12-12 MED ORDER — LAMOTRIGINE 25 MG PO TABS
25.0000 mg | ORAL_TABLET | Freq: Every day | ORAL | 2 refills | Status: DC
Start: 2020-12-12 — End: 2021-03-25

## 2020-12-12 MED ORDER — GLUCOSE-VITAMIN C 4-6 GM-MG PO CHEW
CHEWABLE_TABLET | ORAL | Status: AC
  Administered 2020-12-12: 09:00:00 4 g
  Filled 2020-12-12: qty 1

## 2020-12-12 MED ORDER — HEPARIN SODIUM (PORCINE) 1000 UNIT/ML IJ SOLN: 3800.0000 [IU] | Freq: Once | INTRAMUSCULAR | Status: AC

## 2020-12-12 NOTE — Discharge Summary (Addendum)
Physician Discharge Summary  Max Pittman. VFI:[id number] DOB: March 10, 1975 DOA: 12/09/2020  PCP: Chesley Noon, MD  Admit date: 12/09/2020 Discharge date: 12/13/2020  Admitted From: Home Disposition:  Home   Recommendations for Outpatient Follow-up:  Follow up with PCP in 1-2 weeks Please obtain BMP/CBC in one week   Home Health: [place], [place], [place]   Discharge Condition: Stable CODE STATUS:FULL Diet recommendation: Renal   Brief/Interim Summary: 45 year old male with known epilepsy, end-stage renal disease on hemodialysis, type 2 diabetes mellitus, hypertension, bipolar disorder, history of DVT, peripheral vascular disease, OSA not on CPAP, cerebrovascular disease status post strokes presented to the emergency department after a witnessed seizure by his wife.  The patient was in the process of having his dialysis days changed and he had gone 4 days without treatment.  He is also having frequent staring spells.  Wife reported a full tonic-clonic seizure after having a brief episode of staring.  According to wife he has been taking his medications as prescribed.  She states he's been on buproprion x 2 years for smoking cessation.    Dr. Hortense Ramal was consulted to assist and made recommendations regarding his AED regimen as below.  Overall, his mental status improved over 24-48 hours, especially after he was dialyzed.  On the day of d/c, his mental status was near baseline.    Discharge Diagnoses:  Epilepsy - likely related to changes in hemodialysis treatments  - EEG ordered--no seizure activity - consult to epileptologist--appreciate Dr. Hortense Ramal - discontinue buproprion - continue seizure precautions - follow up electrolytes closely and replete as needed  - further recommendations to follow  - resume gabapentin after HD today - lamictal increased to 125 mg at hs and continue 100mg  q AM - continue keppra 500 mg daily -outpatient referral made patient to see epilepsy MD after  d/c   Acute metabolic Encephalopathy -improved, A&O x 3 on 11/17 -likely multifactorial including missing HD and prolonged post-ictal state -UA--no pyuria -B12--326 -folate--41.5 -TSH--1.735 -UDS--neg -mental status much improved at time of d/c   ESRD  - he is no longer on PD and now receiving HD at [place]  - appreciate nephrology consultation - lasix 40 mg daily resumed from home meds - last HD 12/12/20 - now on TTS schedule   Essential hypertension - resume home medications and follow   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    Bipolar Disorder - Uncontrolled  - reumed home fluoxetine, amitryptyline  - wife says disease is becoming uncontrolled and he is having more freqeuent outbursts  - he has been having more delusions and psychotic spells at home - consult TTS--did not recommend any changes at this time   Chronic hip infection  - wife tells me he has to take cephalexin for life as prophylaxis for infection  - wife says patient has hardware in hip, pins/screws - resume cephalexin 250 mg BID      Discharge Instructions  Discharge Instructions     Ambulatory referral to Neurology   Complete by: As directed    Refer to [facility]      Allergies as of 12/12/2020       Reactions   Doxycycline Hives   Latex Swelling   Pt reports swelling at site.   Morphine Anxiety        Medication List     STOP taking these medications    buPROPion 150 MG 12 hr  tablet Commonly known as: WELLBUTRIN SR   loratadine 10 MG tablet Commonly known as: CLARITIN   meclizine 25 MG tablet Commonly known as: ANTIVERT   melatonin 5 MG Tabs   pantoprazole 40 MG tablet Commonly known as: PROTONIX       TAKE these medications    amitriptyline 75 MG tablet Commonly known as: ELAVIL TAKE 1 TABLET BY MOUTH AT BEDTIME.   atorvastatin 40 MG tablet Commonly known as: LIPITOR TAKE 1  TABLET BY MOUTH DAILY AT 6 PM.   baclofen 10 MG tablet Commonly known as: LIORESAL Take 10 mg by mouth 3 (three) times daily.   cephALEXin 250 MG capsule Commonly known as: KEFLEX Take 1 capsule (250 mg total) by mouth every 12 (twelve) hours.   Cholecalciferol 25 MCG (1000 UT) tablet Take 2,000 Units by mouth daily.   [employer] Anti-Itch lotion Generic drug: camphor-menthol APPLY TOPICALLY AS NEEDED FOR ITCHING. What changed: See the new instructions.   docusate sodium 100 MG capsule Commonly known as: COLACE TAKE 1 CAPSULE BY MOUTH TWICE A DAY   FLUoxetine 20 MG capsule Commonly known as: PROZAC Take 10 mg by mouth daily.   furosemide 40 MG tablet Commonly known as: LASIX Take 40 mg by mouth daily.   gabapentin 300 MG capsule Commonly known as: NEURONTIN TAKE 1 CAPSULE BY MOUTH THREE TIMES A DAY What changed: See the new instructions.   GlucoCom Blood Glucose Monitor Devi Use to check blood sugar daily. E11.40   hydrALAZINE 25 MG tablet Commonly known as: APRESOLINE Take 1 tablet by mouth 3 (three) times daily.   isosorbide mononitrate 30 MG 24 hr tablet Commonly known as: IMDUR Take 0.5 tablets (15 mg total) by mouth daily.   lamoTRIgine 100 MG tablet Commonly known as: LAMICTAL TAKE 1 TABLET BY MOUTH TWICE A DAY What changed: Another medication with the same name was added. Make sure you understand how and when to take each.   lamoTRIgine 25 MG tablet Commonly known as: LAMICTAL Take 1 tablet (25 mg total) by mouth at bedtime. Take with 100 mg tablet every evening. What changed: You were already taking a medication with the same name, and this prescription was added. Make sure you understand how and when to take each.   levETIRAcetam 500 MG tablet Commonly known as: KEPPRA Take 1 tablet (500 mg total) by mouth daily.   metoprolol tartrate 25 MG tablet Commonly known as: LOPRESSOR Take 0.5 tablets (12.5 mg total) by mouth 2 (two) times daily. What changed:  additional instructions   multivitamin Tabs tablet Take 1 tablet by mouth at bedtime.   omeprazole 40 MG capsule Commonly known as: PRILOSEC Take 40 mg by mouth daily.   oxyCODONE-acetaminophen 7.5-325 MG tablet Commonly known as: PERCOCET Take 1 tablet by mouth every 8 (eight) hours as needed.   polyethylene glycol 17 g packet Commonly known as: MIRALAX / GLYCOLAX Take 17 g by mouth daily. What changed:  when to take this reasons to take this   ranolazine 500 MG 12 hr tablet Commonly known as: RANEXA Take 1 tablet (500 mg total) by mouth 2 (two) times daily. Please make yearly appt with Cardiologist for October 2022 for future refills. Thank you 1st attempt   sevelamer carbonate 800 MG tablet Commonly known as: RENVELA Take 2 tablets (1,600 mg total) by mouth 3 (three) times daily with meals.   Vitamin D (Ergocalciferol) 1.25 MG (50000 UNIT) Caps capsule Commonly known as: DRISDOL Take 50,000 Units by mouth 2 (two)  times a week.        Allergies  Allergen Reactions   Doxycycline Hives   Latex Swelling    Pt reports swelling at site.    Morphine Anxiety    Consultations: neurology   Procedures/Studies: CT Head Wo Contrast  Result Date: 12/09/2020 CLINICAL DATA:  Seizure. EXAM: CT HEAD WITHOUT CONTRAST TECHNIQUE: Contiguous axial images were obtained from the base of the skull through the vertex without intravenous contrast. COMPARISON:  MR head without contrast 12/20/2019 FINDINGS: Brain: Chronic encephalomalacia again noted left frontal operculum. Remote hemorrhagic infarct noted in the right caudate head. Remote nonhemorrhagic lacunar infarcts again seen in the posterior left cerebellum. No acute infarct, hemorrhage, or mass lesion is present. No significant interval change present. White matter is otherwise within normal limits. The ventricles are of normal size. No significant extraaxial fluid collection is present. Brainstem and cerebellum are otherwise  unremarkable. Vascular: Minimal vascular calcifications are noted within the cavernous internal carotid arteries. No hyperdense vessel is present. Skull: Calvarium is intact. No focal lytic or blastic lesions are present. No significant extracranial soft tissue lesion is present. Sinuses/Orbits: The paranasal sinuses and mastoid air cells are clear. The globes and orbits are within normal limits. IMPRESSION: 1. No acute intracranial abnormality or significant interval change. 2. 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 3. Remote hemorrhagic infarct of the right caudate head. 4. Remote nonhemorrhagic lacunar infarcts of the posterior left cerebellum. Electronically Signed   By: San Morelle M.D.   On: 12/09/2020 15:28   PERIPHERAL VASCULAR CATHETERIZATION  Result Date: 11/26/2020 Images from the original result were not included. Patient name: Hakeem Frazzini. MRN: [id number] DOB: Jan 08, 1976 Sex: male 11/26/2020 Pre-operative Diagnosis: End-stage renal disease Post-operative diagnosis:  Same Surgeon:  Annamarie Major Procedure Performed:  1.  Ultrasound-guided access, left basilic vein  2.  Fistulogram  3.  Conscious sedation, 18 minutes  Indications: The patient has previously undergone a basilic vein fistula creation which has been difficult to cannulate.  He is here for fistulogram. Procedure:  The patient was identified in the holding area and taken to room 8.  The patient was then placed supine on the table and prepped and draped in the usual sterile fashion.  A time out was called.  Conscious sedation was administered with the use of IV fentanyl and Versed under continuous physician and nurse monitoring.  Heart rate, blood pressure, and oxygen saturations were continuously monitored.  Total sedation time was 18 minutes ultrasound was used to evaluate the fistula.  The vein was patent and compressible.  A digital ultrasound image was acquired.  The fistula was then accessed under  ultrasound guidance using a micropuncture needle.  An 018 wire was then asvanced without resistance and a micropuncture sheath was placed.  Contrast injections were then performed through the sheath. Findings: The central venous system is widely patent.  The arterial venous anastomosis is widely patent.  No stenosis is identified within the basilic vein fistula.  There is a large collateral up in the axilla that feeds into the central venous system.  Manual pressure was held for hemostasis.  Intervention: None Impression:  #1  No central venous stenosis or anastomotic stenosis.  The fistula is widely patent  #2  There is a large branch originating at the midportion of the proximal incision in the axilla.  I have recommended ligation of this branch and potentially elevation of the fistula in the operating room in the near future. Theotis Burrow, M.D.,  [facility] [place] Office: [phone] Pager:  [phone]   EEG adult  Result Date: 12/10/2020 Lora Havens, MD     12/10/2020  1:11 PM Patient Name: Max Pittman. MRN: [id number] Epilepsy Attending: Lora Havens Referring Physician/Provider: Bethena Roys, MD Date: 12/10/2020 Duration: 22.14 mins Patient history: 45 year old male with history of epilepsy who presented with breakthrough seizure.  EEG evaluate for seizures. Level of alertness: Awake AEDs during EEG study: LEV, LTG Technical aspects: This EEG study was done with scalp electrodes positioned according to the 10-20 International system of electrode placement. Electrical activity was acquired at a sampling rate of 500Hz  and reviewed with a high frequency filter of 70Hz  and a low frequency filter of 1Hz . EEG data were recorded continuously and digitally stored. Description: No clear posterior dominant rhythm was seen.  EEG showed continuous generalized predominantly 5 to 7 Hz theta slowing as well as intermittent generalized 2 to 3 Hz delta slowing.  Hyperventilation and photic stimulation were not performed.   ABNORMALITY - Continuous slow, generalized IMPRESSION: This study is suggestive of moderate diffuse encephalopathy, nonspecific etiology. No seizures or epileptiform discharges were seen throughout the recording. Lora Havens   CUP PACEART REMOTE DEVICE CHECK  Result Date: 12/02/2020 ILR summary report received. Battery status OK. Normal device function. No new symptom, tachy, brady, or pause episodes. No new AF episodes. Monthly summary reports and ROV/PRN LR       Discharge Exam: Vitals:   12/12/20 1100 12/12/20 1130  BP: (!) 170/88 (!) (P) 161/93  Pulse: 90 (P) 97  Resp: 20 (P) 20  Temp:    SpO2:     Vitals:   12/12/20 1000 12/12/20 1030 12/12/20 1100 12/12/20 1130  BP: (!) 145/81 (!) 150/82 (!) 170/88 (!) (P) 161/93  Pulse: 91 90 90 (P) 97  Resp: 20 18 20  (P) 20  Temp:      TempSrc:      SpO2:      Weight:      Height:        General: Pt is alert, awake, not in acute distress Cardiovascular: RRR, S1/S2 +, no rubs, no gallops Respiratory: CTA bilaterally, no wheezing, no rhonchi Abdominal: Soft, NT, ND, bowel sounds + Extremities: no edema, no cyanosis   The results of significant diagnostics from this hospitalization (including imaging, microbiology, ancillary and laboratory) are listed below for reference.    Significant Diagnostic Studies: CT Head Wo Contrast  Result Date: 12/09/2020 CLINICAL DATA:  Seizure. EXAM: CT HEAD WITHOUT CONTRAST TECHNIQUE: Contiguous axial images were obtained from the base of the skull through the vertex without intravenous contrast. COMPARISON:  MR head without contrast 12/20/2019 FINDINGS: Brain: Chronic encephalomalacia again noted left frontal operculum. Remote hemorrhagic infarct noted in the right caudate head. Remote nonhemorrhagic lacunar infarcts again seen in the posterior left cerebellum. No acute infarct, hemorrhage, or mass lesion is present. No significant  interval change present. White matter is otherwise within normal limits. The ventricles are of normal size. No significant extraaxial fluid collection is present. Brainstem and cerebellum are otherwise unremarkable. Vascular: Minimal vascular calcifications are noted within the cavernous internal carotid arteries. No hyperdense vessel is present. Skull: Calvarium is intact. No focal lytic or blastic lesions are present. No significant extracranial soft tissue lesion is present. Sinuses/Orbits: The paranasal sinuses and mastoid air cells are clear. The globes and orbits are within normal limits. IMPRESSION: 1. No acute intracranial abnormality or significant interval change. 2. Chronic  encephalomalacia involving the left frontal operculum. 3. Remote hemorrhagic infarct of the right caudate head. 4. Remote nonhemorrhagic lacunar infarcts of the posterior left cerebellum. Electronically Signed   By: San Morelle M.D.   On: 12/09/2020 15:28   PERIPHERAL VASCULAR CATHETERIZATION  Result Date: 11/26/2020 Images from the original result were not included. Patient name: Zakhari Fogel. MRN: [id number] DOB: 12/27/1975 Sex: male 11/26/2020 Pre-operative Diagnosis: End-stage renal disease Post-operative diagnosis:  Same Surgeon:  Annamarie Major Procedure Performed:  1.  Ultrasound-guided access, left basilic vein  2.  Fistulogram  3.  Conscious sedation, 18 minutes  Indications: The patient has previously undergone a basilic vein fistula creation which has been difficult to cannulate.  He is here for fistulogram. Procedure:  The patient was identified in the holding area and taken to room 8.  The patient was then placed supine on the table and prepped and draped in the usual sterile fashion.  A time out was called.  Conscious sedation was administered with the use of IV fentanyl and Versed under continuous physician and nurse monitoring.  Heart rate, blood pressure, and oxygen saturations were continuously monitored.   Total sedation time was 18 minutes ultrasound was used to evaluate the fistula.  The vein was patent and compressible.  A digital ultrasound image was acquired.  The fistula was then accessed under ultrasound guidance using a micropuncture needle.  An 018 wire was then asvanced without resistance and a micropuncture sheath was placed.  Contrast injections were then performed through the sheath. Findings: The central venous system is widely patent.  The arterial venous anastomosis is widely patent.  No stenosis is identified within the basilic vein fistula.  There is a large collateral up in the axilla that feeds into the central venous system.  Manual pressure was held for hemostasis.  Intervention: None Impression:  #1  No central venous stenosis or anastomotic stenosis.  The fistula is widely patent  #2  There is a large branch originating at the midportion of the proximal incision in the axilla.  I have recommended ligation of this branch and potentially elevation of the fistula in the operating room in the near future. Theotis Burrow, M.D., [place] [facility] [place] Office: [phone] Pager:  [phone]   EEG adult  Result Date: 12/10/2020 Lora Havens, MD     12/10/2020  1:11 PM Patient Name: Max Pittman. MRN: [id number] Epilepsy Attending: Lora Havens Referring Physician/Provider: Bethena Roys, MD Date: 12/10/2020 Duration: 22.14 mins Patient history: 45 year old male with history of epilepsy who presented with breakthrough seizure.  EEG evaluate for seizures. Level of alertness: Awake AEDs during EEG study: LEV, LTG Technical aspects: This EEG study was done with scalp electrodes positioned according to the 10-20 International system of electrode placement. Electrical activity was acquired at a sampling rate of 500Hz  and reviewed with a high frequency filter of 70Hz  and a low frequency filter of 1Hz . EEG data were recorded continuously and digitally  stored. Description: No clear posterior dominant rhythm was seen.  EEG showed continuous generalized predominantly 5 to 7 Hz theta slowing as well as intermittent generalized 2 to 3 Hz delta slowing. Hyperventilation and photic stimulation were not performed.   ABNORMALITY - Continuous slow, generalized IMPRESSION: This study is suggestive of moderate diffuse encephalopathy, nonspecific etiology. No seizures or epileptiform discharges were seen throughout the recording. Lora Havens   CUP PACEART REMOTE DEVICE CHECK  Result Date: 12/02/2020 ILR summary report  received. Battery status OK. Normal device function. No new symptom, tachy, brady, or pause episodes. No new AF episodes. Monthly summary reports and ROV/PRN LR   Microbiology: Recent Results (from the past 240 hour(s))  Resp Panel by RT-PCR (Flu A&B, Covid) Nasopharyngeal Swab     Status: None   Collection Time: 12/09/20  2:42 PM   Specimen: Nasopharyngeal Swab; Nasopharyngeal(NP) swabs in vial transport medium  Result Value Ref Range Status   SARS Coronavirus 2 by RT PCR NEGATIVE NEGATIVE Final    Comment: (NOTE) SARS-CoV-2 target nucleic acids are NOT DETECTED.  The SARS-CoV-2 RNA is generally detectable in upper respiratory specimens during the acute phase of infection. The lowest concentration of SARS-CoV-2 viral copies this assay can detect is 138 copies/mL. A negative result does not preclude SARS-Cov-2 infection and should not be used as the sole basis for treatment or other patient management decisions. A negative result may occur with  improper specimen collection/handling, submission of specimen other than nasopharyngeal swab, presence of viral mutation(s) within the areas targeted by this assay, and inadequate number of viral copies(<138 copies/mL). A negative result must be combined with clinical observations, patient history, and epidemiological information. The expected result is Negative.  Fact Sheet for  Patients:  EntrepreneurPulse.com.au  Fact Sheet for Healthcare Providers:  IncredibleEmployment.be  This test is no t yet approved or cleared by the [place] FDA and  has been authorized for detection and/or diagnosis of SARS-CoV-2 by FDA under an Emergency Use Authorization (EUA). This EUA will remain  in effect (meaning this test can be used) for the duration of the COVID-19 declaration under Section 564(b)(1) of the Act, 21 U.S.C.section 360bbb-3(b)(1), unless the authorization is terminated  or revoked sooner.       Influenza A by PCR NEGATIVE NEGATIVE Final   Influenza B by PCR NEGATIVE NEGATIVE Final    Comment: (NOTE) The Xpert Xpress SARS-CoV-2/FLU/RSV plus assay is intended as an aid in the diagnosis of influenza from Nasopharyngeal swab specimens and should not be used as a sole basis for treatment. Nasal washings and aspirates are unacceptable for Xpert Xpress SARS-CoV-2/FLU/RSV testing.  Fact Sheet for Patients: EntrepreneurPulse.com.au  Fact Sheet for Healthcare Providers: IncredibleEmployment.be  This test is not yet approved or cleared by the [place] FDA and has been authorized for detection and/or diagnosis of SARS-CoV-2 by FDA under an Emergency Use Authorization (EUA). This EUA will remain in effect (meaning this test can be used) for the duration of the COVID-19 declaration under Section 564(b)(1) of the Act, 21 U.S.C. section 360bbb-3(b)(1), unless the authorization is terminated or revoked.  Performed at [facility], [address], [place], [place] [address]   MRSA Next Gen by PCR, Nasal     Status: None   Collection Time: 12/09/20 10:02 PM   Specimen: Nasal Mucosa; Nasal Swab  Result Value Ref Range Status   MRSA by PCR Next Gen NOT DETECTED NOT DETECTED Final    Comment: (NOTE) The GeneXpert MRSA Assay (FDA approved for NASAL specimens only), is one component of a  comprehensive MRSA colonization surveillance program. It is not intended to diagnose MRSA infection nor to guide or monitor treatment for MRSA infections. Test performance is not FDA approved in patients less than 37 years old. Performed at [facility], [address] [place], [place] [address]      Labs: Basic Metabolic Panel: Recent Labs  Lab 12/09/20 1357 12/09/20 1623 12/12/20 0602  NA 138  --  139  K 5.4*  --  4.9  CL 103  --  101  CO2 23  --  25  GLUCOSE 101*  --  77  BUN 65*  --  54*  CREATININE 12.68*  --  12.57*  CALCIUM 9.2  --  9.6  MG  --  2.4  --   PHOS  --  5.3* 5.5*   Liver Function Tests: Recent Labs  Lab 12/09/20 1357 12/12/20 0602  AST 15  --   ALT 17  --   ALKPHOS 76  --   BILITOT 0.9  --   PROT 7.7  --   ALBUMIN 3.7 3.5   No results for input(s): LIPASE, AMYLASE in the last 168 hours. Recent Labs  Lab 12/11/20 1050  AMMONIA 20   CBC: Recent Labs  Lab 12/09/20 1357 12/12/20 0602  WBC 9.7 8.4  NEUTROABS 7.7  --   HGB 11.0* 11.4*  HCT 34.3* 36.7*  MCV 90.3 90.8  PLT 283 246   Cardiac Enzymes: No results for input(s): CKTOTAL, CKMB, CKMBINDEX, TROPONINI in the last 168 hours. BNP: Invalid input(s): POCBNP CBG: Recent Labs  Lab 12/11/20 1106 12/11/20 1621 12/12/20 0830 12/12/20 0914 12/12/20 1136  GLUCAP 103* 69* 60* 81 93    Time coordinating discharge:  36 minutes  Signed:  Orson Eva, DO [facility] Pager: [phone] 12/12/2020, 11:54 AM

## 2020-12-12 NOTE — TOC Transition Note (Addendum)
Transition of Care [facility]) - CM/SW Discharge Note   Patient Details  Name: Max Pittman. MRN: [id number] Date of Birth: Aug 06, 1975  Transition of Care [facility]) CM/SW Contact:  Boneta Lucks, RN Phone Number: 12/12/2020, 12:29 PM   Clinical Narrative:   Patient admitted with Seizures. Significant other is requesting home health.  He has used [facility] in the past. Vaughan Basta accepted the referral for HHPT/OT/RN. Add to AVS. MD will order.   Addendum : patient was psy cleared. [name] is agreeing to take him home. She does not wanting out patient psy appointment at this time.  Adding SW to [facility] for further assistance. She is on her way to take him home. RN/MD updated.   Final next level of care: [place] Barriers to Discharge: Barriers Resolved   Patient Goals and CMS Choice Patient states their goals for this hospitalization and ongoing recovery are:: to go home. CMS Medicare.gov Compare Post Acute Care list provided to:: Patient Represenative (must comment) Choice offered to / list presented to : Spouse   Discharge Plan and Services        HH Arranged: RN, PT, OT [facility] Agency: [place] ([place]) Date [place]: 12/12/20 Time [employer]: 1100 Representative spoke with at [place]: Romualdo Bolk  Readmission Risk Interventions Readmission Risk Prevention Plan 12/12/2020 03/26/2020  Transportation Screening Complete Complete  PCP or Specialist Appt within 3-5 Days Not Complete -  [place]RI or [place] Complete -  Social Work Consult for [place] Planning/Counseling Complete -  Palliative Care Screening Not Applicable -  Medication Review Press photographer) Complete Complete  PCP or Specialist appointment within 3-5 days of discharge - Not Complete  PCP/Specialist Appt Not Complete comments - going to [place] rehab  [place] or [place] - Complete  SW Recovery Care/Counseling Consult - Complete  [place] - Patient Refused

## 2020-12-12 NOTE — Progress Notes (Addendum)
Responded to nursing call:  patient and significant other concerns.    Subjective: Patient states he is hearing voices and seeing people not present.  Denies cp, sob, n/v/d.  Significant other upset that patient has not been evaluated by psychiatry.  She feels that we do not believe her that patient is hallucinating  Vitals:   12/12/20 1200 12/12/20 1230 12/12/20 1300 12/12/20 1340  BP: (!) 160/86 (!) 150/80 (!) 154/90 (!) 160/88  Pulse: 96 94 (!) 102 (!) 102  Resp: 20 20 20 18   Temp:    98.4 F (36.9 C)  TempSrc:    Oral  SpO2:    96%  Weight:    97.5 kg  Height:       CV--RRR Lung--CTA Abd--soft+BS/NT   Assessment/Plan: Auditory and Visual Hallucinations -significant other requesting psychiatry evaluation -patient having paranoia with hallucination -request telepsychiatry evaluation per significant other request -updated significant other that metabolic work up has been unremarkable -hold discharge     Orson Eva, DO [facility]

## 2020-12-12 NOTE — Progress Notes (Signed)
Pt does not meet inpatient criteria at this time per review by Dr. [name] Abbot who has reviewed pt's medications and does not recommend changes to current medications nor does not recommend adding antipsychotic medications due this patient's co morbidities.  This is CSW will not follow pt at this time due to [facility] follow pt. CSW will await direction from [facility] in regards to removing pt from [facility] shift report.    Benjaman Kindler, MSW, [facility] 12/12/2020 8:23 PM

## 2020-12-12 NOTE — Progress Notes (Signed)
[facility] NEPHROLOGY PROGRESS NOTE  Assessment/ Plan: Pt is a 45 y.o. yo male  with sz d/o and ESRD-  admitted after tonic clonic sz.  OP HD Order: Dialyzes at [facility]-  was MWF-  now to be TTS   EDW 104.5. HD [place] 2/2.5, Dialyzer 300 rev, Heparin 1200 load , then 500 per hour. Access [facility]-  has left upper arm AVF-  was due to have revision on Friday.  600 of EPO- calcitriol 0.25 and 50 venofer per week    #Epilepsy/seizure disorder: EEG with no seizure activity.  Neurologist discontinued bupropion, resume gabapentin.  On Keppra and Lamictal.  No seizure episode and clinically looks stable today.  # ESRD now TTS schedule at [name]: Plan for dialysis today via [facility].  It looks like there is a plan to have AV fistula revision on Friday which may have to reschedule if he is still here.  # Anemia of ESRD: Continue ESA.  Monitor hemoglobin.  # Secondary hyperparathyroidism: Continue calcitriol, Renvela.Calcium and phosphorus are reasonably well controlled.  # HTN/volume: Blood pressure and volume status looks acceptable.  On metoprolol, Imdur and Lasix.  Subjective: Seen and examined at bedside.  He was alert awake and able to answer.  Denies nausea, vomiting, chest pain, shortness of breath.  No seizure overnight. Objective Vital signs in last 24 hours: Vitals:   12/11/20 1033 12/11/20 1434 12/11/20 1947 12/12/20 0527  BP: (!) 166/90 (!) 156/86 (!) 164/78 128/79  Pulse: 91 95 96 80  Resp: 19 17 18 16   Temp: 98.8 F (37.1 C) 98.3 F (36.8 C) 98.3 F (36.8 C) 97.8 F (36.6 C)  TempSrc: Oral Oral    SpO2: 96% 98% 96% 98%  Weight:      Height:       Weight change:   Intake/Output Summary (Last 24 hours) at 12/12/2020 0904 Last data filed at 12/11/2020 1315 Gross per 24 hour  Intake 0 ml  Output 550 ml  Net -550 ml       Labs: Basic Metabolic Panel: Recent Labs  Lab 12/09/20 1357 12/09/20 1623 12/12/20 0602  NA 138  --  139  K 5.4*  --  4.9   CL 103  --  101  CO2 23  --  25  GLUCOSE 101*  --  77  BUN 65*  --  54*  CREATININE 12.68*  --  12.57*  CALCIUM 9.2  --  9.6  PHOS  --  5.3* 5.5*   Liver Function Tests: Recent Labs  Lab 12/09/20 1357 12/12/20 0602  AST 15  --   ALT 17  --   ALKPHOS 76  --   BILITOT 0.9  --   PROT 7.7  --   ALBUMIN 3.7 3.5   No results for input(s): LIPASE, AMYLASE in the last 168 hours. Recent Labs  Lab 12/11/20 1050  AMMONIA 20   CBC: Recent Labs  Lab 12/09/20 1357 12/12/20 0602  WBC 9.7 8.4  NEUTROABS 7.7  --   HGB 11.0* 11.4*  HCT 34.3* 36.7*  MCV 90.3 90.8  PLT 283 246   Cardiac Enzymes: No results for input(s): CKTOTAL, CKMB, CKMBINDEX, TROPONINI in the last 168 hours. CBG: Recent Labs  Lab 12/11/20 0530 12/11/20 0743 12/11/20 1106 12/11/20 1621 12/12/20 0830  GLUCAP 66* 73 103* 69* 60*    Iron Studies: No results for input(s): IRON, TIBC, TRANSFERRIN, FERRITIN in the last 72 hours. Studies/Results: EEG adult  Result Date: 12/10/2020 Lora Havens,  MD     12/10/2020  1:11 PM Patient Name: Max Pittman. MRN: [id number] Epilepsy Attending: Lora Havens Referring Physician/Provider: Bethena Roys, MD Date: 12/10/2020 Duration: 22.14 mins Patient history: 45 year old male with history of epilepsy who presented with breakthrough seizure.  EEG evaluate for seizures. Level of alertness: Awake AEDs during EEG study: LEV, LTG Technical aspects: This EEG study was done with scalp electrodes positioned according to the 10-20 International system of electrode placement. Electrical activity was acquired at a sampling rate of 500Hz  and reviewed with a high frequency filter of 70Hz  and a low frequency filter of 1Hz . EEG data were recorded continuously and digitally stored. Description: No clear posterior dominant rhythm was seen.  EEG showed continuous generalized predominantly 5 to 7 Hz theta slowing as well as intermittent generalized 2 to 3 Hz delta slowing.  Hyperventilation and photic stimulation were not performed.   ABNORMALITY - Continuous slow, generalized IMPRESSION: This study is suggestive of moderate diffuse encephalopathy, nonspecific etiology. No seizures or epileptiform discharges were seen throughout the recording. Priyanka Barbra Sarks    Medications: Infusions:   Scheduled Medications:  amitriptyline  75 mg Oral QHS   calcitRIOL  0.25 mcg Oral Q T,Th,Sa-HD   cephALEXin  250 mg Oral Q12H   Chlorhexidine Gluconate Cloth  6 each Topical Daily   Chlorhexidine Gluconate Cloth  6 each Topical Q0600   Chlorhexidine Gluconate Cloth  6 each Topical Q0600   darbepoetin (ARANESP) injection - DIALYSIS  60 mcg Intravenous Q Tue-HD   FLUoxetine  10 mg Oral Daily   furosemide  40 mg Oral Daily   gabapentin  300 mg Oral TID   glucose-Vitamin C       heparin  5,000 Units Subcutaneous Q8H   isosorbide mononitrate  15 mg Oral Daily   lamoTRIgine  100 mg Oral q morning   lamoTRIgine  125 mg Oral QHS   levETIRAcetam  500 mg Oral Daily   metoprolol tartrate  25 mg Oral BID   multivitamin  1 tablet Oral QHS   pantoprazole  40 mg Oral Daily   ranolazine  500 mg Oral BID   sevelamer carbonate  1,600 mg Oral TID WC    have reviewed scheduled and prn medications.  Physical Exam: General:NAD, comfortable Heart:RRR, s1s2 nl Lungs:clear b/l, no crackle Abdomen:soft, Non-tender, non-distended Extremities: Trace leg edema present. Dialysis Access: [facility] in place, left upper extremity AV fistula is maturing.  Byrant Valent Reesa Chew Dallis Czaja 12/12/2020,9:04 AM  LOS: 2 days

## 2020-12-13 ENCOUNTER — Other Ambulatory Visit: Payer: Self-pay | Admitting: Physical Medicine and Rehabilitation

## 2020-12-13 DIAGNOSIS — N186 End stage renal disease: Secondary | ICD-10-CM | POA: Diagnosis not present

## 2020-12-13 LAB — GLUCOSE, CAPILLARY: Glucose-Capillary: 87 mg/dL (ref 70–99)

## 2020-12-13 NOTE — Progress Notes (Addendum)
PROGRESS NOTE  Max Pittman. WUJ:[id number] DOB: 04/09/1975 DOA: 12/09/2020 PCP: Chesley Noon, MD  Brief History:  45 year old male with known epilepsy, end-stage renal disease on hemodialysis, type 2 diabetes mellitus, hypertension, bipolar disorder, history of DVT, peripheral vascular disease, OSA not on CPAP, cerebrovascular disease status post strokes presented to the emergency department after a witnessed seizure by his wife.  The patient was in the process of having his dialysis days changed and he had gone 4 days without treatment.  He is also having frequent staring spells.  Wife reported a full tonic-clonic seizure after having a brief episode of staring.  According to wife he has been taking his medications as prescribed.  She states he's been on buproprion x 2 years for smoking cessation.    Dr. Hortense Ramal was consulted to assist and made recommendations regarding his AED regimen as below.  Overall, his mental status improved over 24-48 hours, especially after he was dialyzed.  On the day of d/c, his mental status was improved and he is A&O x 4  Assessment/Plan: Epilepsy - likely related to changes in hemodialysis treatments  - EEG ordered--no seizure activity - consult to epileptologist--appreciate Dr. Hortense Ramal - discontinue buproprion - continue seizure precautions - follow up electrolytes closely and replete as needed  - further recommendations to follow  - resume gabapentin after HD  - lamictal increased to 125 mg at hs and continue 100mg  q AM - continue keppra 500 mg daily -outpatient referral made patient to see epilepsy MD after d/c   Acute metabolic Encephalopathy -improved, A&O x 4 on 11/18 -likely multifactorial including missing HD and prolonged post-ictal state -UA--no pyuria -B12--326 -folate--41.5 -TSH--1.735 -UDS--neg -mental status much improved at time of d/c   ESRD  - he is no longer on PD and now receiving HD at [facility]  - appreciate  nephrology consultation - lasix 40 mg daily resumed from home meds - last HD 12/12/20 - now on TTS schedule   Essential hypertension - resume home medications and follow   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    Bipolar Disorder - Uncontrolled  - reumed home fluoxetine, amitryptyline  - wife says disease is becoming uncontrolled and he is having more freqeuent outbursts  - significant other claims patient is  having more delusions and psychotic spells at home - consult TTS--did not recommend any changes at this time;  they did not feel patient needed [facility] admission, nor did they feel patient needed any additional psychotropic meds -pt denies having any further delusions or hallucinations   Chronic hip infection  - wife tells me he has to take cephalexin for life as prophylaxis for infection  - wife says patient has hardware in hip, pins/screws - resume cephalexin 250 mg BID   Class I obesity -BMI 31.74    Status is: Inpatient          Family Communication:   Significant other updated at bedside 11/18  Consultants:  renal, telepsych  Code Status:  FULL   DVT Prophylaxis:  [place] Heparin / [place] Lovenox   Procedures: As Listed in Progress Note Above  Antibiotics: None        Subjective: Patient denies fevers, chills, headache, chest pain, dyspnea, nausea, vomiting, diarrhea, abdominal pain, dysuria, hematuria, hematochezia, and melena.   Objective: Vitals:   12/12/20 1300 12/12/20 1340 12/12/20 2155 12/13/20 0541  BP: (!) 154/90 (!) 160/88 (!) 142/85 117/72  Pulse: (!) 102 (!) 102 97 88  Resp: 20 18 18 18   Temp:  98.4 F (36.9 C) 98.5 F (36.9 C) 98.5 F (36.9 C)  TempSrc:  Oral Oral   SpO2:  96% 95% 96%  Weight:  97.5 kg    Height:        Intake/Output Summary (Last 24 hours) at 12/13/2020 1041 Last data filed at 12/12/2020 1340 Gross per 24 hour  Intake --  Output  2000 ml  Net -2000 ml   Weight change:  Exam:  General:  Pt is alert, follows commands appropriately, not in acute distress HEENT: No icterus, No thrush, No neck mass, [place]/AT Cardiovascular: RRR, S1/S2, no rubs, no gallops Respiratory: CTA bilaterally, no wheezing, no crackles, no rhonchi Abdomen: Soft/+BS, non tender, non distended, no guarding Extremities: No edema, No lymphangitis, No petechiae, No rashes, no synovitis   Data Reviewed: I have personally reviewed following labs and imaging studies Basic Metabolic Panel: Recent Labs  Lab 12/09/20 1357 12/09/20 1623 12/12/20 0602  NA 138  --  139  K 5.4*  --  4.9  CL 103  --  101  CO2 23  --  25  GLUCOSE 101*  --  77  BUN 65*  --  54*  CREATININE 12.68*  --  12.57*  CALCIUM 9.2  --  9.6  MG  --  2.4  --   PHOS  --  5.3* 5.5*   Liver Function Tests: Recent Labs  Lab 12/09/20 1357 12/12/20 0602  AST 15  --   ALT 17  --   ALKPHOS 76  --   BILITOT 0.9  --   PROT 7.7  --   ALBUMIN 3.7 3.5   No results for input(s): LIPASE, AMYLASE in the last 168 hours. Recent Labs  Lab 12/11/20 1050  AMMONIA 20   Coagulation Profile: No results for input(s): INR, PROTIME in the last 168 hours. CBC: Recent Labs  Lab 12/09/20 1357 12/12/20 0602  WBC 9.7 8.4  NEUTROABS 7.7  --   HGB 11.0* 11.4*  HCT 34.3* 36.7*  MCV 90.3 90.8  PLT 283 246   Cardiac Enzymes: No results for input(s): CKTOTAL, CKMB, CKMBINDEX, TROPONINI in the last 168 hours. BNP: Invalid input(s): POCBNP CBG: Recent Labs  Lab 12/11/20 1621 12/12/20 0830 12/12/20 0914 12/12/20 1136 12/13/20 0542  GLUCAP 69* 60* 81 93 87   HbA1C: No results for input(s): HGBA1C in the last 72 hours. Urine analysis:    Component Value Date/Time   COLORURINE YELLOW 12/11/2020 1319   APPEARANCEUR CLEAR 12/11/2020 1319   LABSPEC 1.011 12/11/2020 1319   PHURINE 8.0 12/11/2020 1319   GLUCOSEU 50 (A) 12/11/2020 1319   HGBUR NEGATIVE 12/11/2020 1319   BILIRUBINUR  NEGATIVE 12/11/2020 1319   KETONESUR NEGATIVE 12/11/2020 1319   PROTEINUR >=300 (A) 12/11/2020 1319   NITRITE NEGATIVE 12/11/2020 1319   LEUKOCYTESUR NEGATIVE 12/11/2020 1319   Sepsis Labs: @LABRCNTIP (procalcitonin:4,lacticidven:4) ) Recent Results (from the past 240 hour(s))  Resp Panel by RT-PCR (Flu A&B, Covid) Nasopharyngeal Swab     Status: None   Collection Time: 12/09/20  2:42 PM   Specimen: Nasopharyngeal Swab; Nasopharyngeal(NP) swabs in vial transport medium  Result Value Ref Range Status   SARS Coronavirus 2 by RT PCR NEGATIVE NEGATIVE Final    Comment: (NOTE) SARS-CoV-2 target nucleic acids are NOT DETECTED.  The SARS-CoV-2 RNA is generally detectable in upper respiratory specimens during the acute phase  of infection. The lowest concentration of SARS-CoV-2 viral copies this assay can detect is 138 copies/mL. A negative result does not preclude SARS-Cov-2 infection and should not be used as the sole basis for treatment or other patient management decisions. A negative result may occur with  improper specimen collection/handling, submission of specimen other than nasopharyngeal swab, presence of viral mutation(s) within the areas targeted by this assay, and inadequate number of viral copies(<138 copies/mL). A negative result must be combined with clinical observations, patient history, and epidemiological information. The expected result is Negative.  Fact Sheet for Patients:  EntrepreneurPulse.com.au  Fact Sheet for Healthcare Providers:  IncredibleEmployment.be  This test is no t yet approved or cleared by the [place] FDA and  has been authorized for detection and/or diagnosis of SARS-CoV-2 by FDA under an Emergency Use Authorization (EUA). This EUA will remain  in effect (meaning this test can be used) for the duration of the COVID-19 declaration under Section 564(b)(1) of the Act, 21 U.S.C.section 360bbb-3(b)(1), unless  the authorization is terminated  or revoked sooner.       Influenza A by PCR NEGATIVE NEGATIVE Final   Influenza B by PCR NEGATIVE NEGATIVE Final    Comment: (NOTE) The Xpert Xpress SARS-CoV-2/FLU/RSV plus assay is intended as an aid in the diagnosis of influenza from Nasopharyngeal swab specimens and should not be used as a sole basis for treatment. Nasal washings and aspirates are unacceptable for Xpert Xpress SARS-CoV-2/FLU/RSV testing.  Fact Sheet for Patients: EntrepreneurPulse.com.au  Fact Sheet for Healthcare Providers: IncredibleEmployment.be  This test is not yet approved or cleared by the [place] FDA and has been authorized for detection and/or diagnosis of SARS-CoV-2 by FDA under an Emergency Use Authorization (EUA). This EUA will remain in effect (meaning this test can be used) for the duration of the COVID-19 declaration under Section 564(b)(1) of the Act, 21 U.S.C. section 360bbb-3(b)(1), unless the authorization is terminated or revoked.  Performed at [facility], [address] [place], [place] [address]   MRSA Next Gen by PCR, Nasal     Status: None   Collection Time: 12/09/20 10:02 PM   Specimen: Nasal Mucosa; Nasal Swab  Result Value Ref Range Status   MRSA by PCR Next Gen NOT DETECTED NOT DETECTED Final    Comment: (NOTE) The GeneXpert MRSA Assay (FDA approved for NASAL specimens only), is one component of a comprehensive MRSA colonization surveillance program. It is not intended to diagnose MRSA infection nor to guide or monitor treatment for MRSA infections. Test performance is not FDA approved in patients less than 40 years old. Performed at [facility], [address], [place], [place] [address]   Urine Culture     Status: None   Collection Time: 12/11/20  1:19 PM   Specimen: Urine, Clean Catch  Result Value Ref Range Status   Specimen Description   Final    URINE, CLEAN CATCH Performed at [facility], [address] [place], [place] [address]    Special Requests   Final    NONE Performed at [facility], [address] [place], [place] [address]    Culture   Final    NO GROWTH Performed at [facility], [place] [address], [place], [place] [address]    Report Status 12/12/2020 FINAL  Final     Scheduled Meds:  amitriptyline  75 mg Oral QHS   calcitRIOL  0.25 mcg Oral Q T,Th,Sa-HD   cephALEXin  250 mg Oral Q12H  Chlorhexidine Gluconate Cloth  6 each Topical Daily   Chlorhexidine Gluconate Cloth  6 each Topical Q0600   Chlorhexidine Gluconate Cloth  6 each Topical Q0600   darbepoetin (ARANESP) injection - DIALYSIS  60 mcg Intravenous Q Tue-HD   FLUoxetine  10 mg Oral Daily   furosemide  40 mg Oral Daily   gabapentin  300 mg Oral TID   heparin  5,000 Units Subcutaneous Q8H   isosorbide mononitrate  15 mg Oral Daily   lamoTRIgine  100 mg Oral q morning   lamoTRIgine  125 mg Oral QHS   levETIRAcetam  500 mg Oral Daily   metoprolol tartrate  25 mg Oral BID   multivitamin  1 tablet Oral QHS   pantoprazole  40 mg Oral Daily   ranolazine  500 mg Oral BID   sevelamer carbonate  1,600 mg Oral TID WC   Continuous Infusions:  Procedures/Studies: CT Head Wo Contrast  Result Date: 12/09/2020 CLINICAL DATA:  Seizure. EXAM: CT HEAD WITHOUT CONTRAST TECHNIQUE: Contiguous axial images were obtained from the base of the skull through the vertex without intravenous contrast. COMPARISON:  MR head without contrast 12/20/2019 FINDINGS: Brain: Chronic encephalomalacia again noted left frontal operculum. Remote hemorrhagic infarct noted in the right caudate head. Remote nonhemorrhagic lacunar infarcts again seen in the posterior left cerebellum. No acute infarct, hemorrhage, or mass lesion is present. No significant interval change present. White matter is otherwise within normal limits. The ventricles are of normal size. No significant extraaxial fluid collection is present. Brainstem and  cerebellum are otherwise unremarkable. Vascular: Minimal vascular calcifications are noted within the cavernous internal carotid arteries. No hyperdense vessel is present. Skull: Calvarium is intact. No focal lytic or blastic lesions are present. No significant extracranial soft tissue lesion is present. Sinuses/Orbits: The paranasal sinuses and mastoid air cells are clear. The globes and orbits are within normal limits. IMPRESSION: 1. No acute intracranial abnormality or significant interval change. 2. 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 3. Remote hemorrhagic infarct of the right caudate head. 4. Remote nonhemorrhagic lacunar infarcts of the posterior left cerebellum. Electronically Signed   By: San Morelle M.D.   On: 12/09/2020 15:28   PERIPHERAL VASCULAR CATHETERIZATION  Result Date: 11/26/2020 Images from the original result were not included. Patient name: Max Pittman. MRN: [id number] DOB: Feb 14, 1975 Sex: male 11/26/2020 Pre-operative Diagnosis: End-stage renal disease Post-operative diagnosis:  Same Surgeon:  Annamarie Major Procedure Performed:  1.  Ultrasound-guided access, left basilic vein  2.  Fistulogram  3.  Conscious sedation, 18 minutes  Indications: The patient has previously undergone a basilic vein fistula creation which has been difficult to cannulate.  He is here for fistulogram. Procedure:  The patient was identified in the holding area and taken to room 8.  The patient was then placed supine on the table and prepped and draped in the usual sterile fashion.  A time out was called.  Conscious sedation was administered with the use of IV fentanyl and Versed under continuous physician and nurse monitoring.  Heart rate, blood pressure, and oxygen saturations were continuously monitored.  Total sedation time was 18 minutes ultrasound was used to evaluate the fistula.  The vein was patent and compressible.  A digital ultrasound image was acquired.  The fistula  was then accessed under ultrasound guidance using a micropuncture needle.  An 018 wire was then asvanced without resistance and a micropuncture sheath was placed.  Contrast injections were then performed through the sheath. Findings: The  central venous system is widely patent.  The arterial venous anastomosis is widely patent.  No stenosis is identified within the basilic vein fistula.  There is a large collateral up in the axilla that feeds into the central venous system.  Manual pressure was held for hemostasis.  Intervention: None Impression:  #1  No central venous stenosis or anastomotic stenosis.  The fistula is widely patent  #2  There is a large branch originating at the midportion of the proximal incision in the axilla.  I have recommended ligation of this branch and potentially elevation of the fistula in the operating room in the near future. Theotis Burrow, M.D., [facility] Office: [phone] Pager:  [phone]   EEG adult  Result Date: 12/10/2020 Lora Havens, MD     12/10/2020  1:11 PM Patient Name: Max Pittman. MRN: [id number] Epilepsy Attending: Lora Havens Referring Physician/Provider: Bethena Roys, MD Date: 12/10/2020 Duration: 22.14 mins Patient history: 45 year old male with history of epilepsy who presented with breakthrough seizure.  EEG evaluate for seizures. Level of alertness: Awake AEDs during EEG study: LEV, LTG Technical aspects: This EEG study was done with scalp electrodes positioned according to the 10-20 International system of electrode placement. Electrical activity was acquired at a sampling rate of 500Hz  and reviewed with a high frequency filter of 70Hz  and a low frequency filter of 1Hz . EEG data were recorded continuously and digitally stored. Description: No clear posterior dominant rhythm was seen.  EEG showed continuous generalized predominantly 5 to 7 Hz theta slowing as well as intermittent generalized 2  to 3 Hz delta slowing. Hyperventilation and photic stimulation were not performed.   ABNORMALITY - Continuous slow, generalized IMPRESSION: This study is suggestive of moderate diffuse encephalopathy, nonspecific etiology. No seizures or epileptiform discharges were seen throughout the recording. Lora Havens   CUP PACEART REMOTE DEVICE CHECK  Result Date: 12/02/2020 ILR summary report received. Battery status OK. Normal device function. No new symptom, tachy, brady, or pause episodes. No new AF episodes. Monthly summary reports and ROV/PRN LR   Orson Eva, DO  [facility]  If 7PM-7AM, please contact night-coverage [email or web address] Password [email or web address] 12/13/2020, 10:41 AM   LOS: 3 days

## 2020-12-13 NOTE — Progress Notes (Signed)
[facility] NEPHROLOGY PROGRESS NOTE  Assessment/ Plan: Pt is a 45 y.o. yo male  with sz d/o and ESRD-  admitted after tonic clonic sz.  OP HD Order: Dialyzes at [facility]-  was MWF-  now to be TTS   EDW 104.5. HD [facility] 2/2.5, Dialyzer 300 rev, Heparin 1200 load , then 500 per hour. Access [facility]-  has left upper arm AVF-  was due to have revision on Friday.  600 of EPO- calcitriol 0.25 and 50 venofer per week    #Epilepsy/seizure disorder: EEG with no seizure activity.  Neurologist discontinued bupropion, resume gabapentin.  On Keppra and Lamictal.  No seizure episode and clinically looks stable today.  # ESRD now TTS schedule at [name]: Status post dialysis yesterday with 2 L UF.  He is under his dry weight therefore need to lower dry weight as outpatient.  Using [facility] and the appointment for AV fistula revision was postponed to next week per patient.  Noted he is being discharged home today.  I recommend him to continue with dialysis tomorrow at outpatient unit.  # Anemia of ESRD: Continue ESA.  Monitor hemoglobin.  # Secondary hyperparathyroidism: Continue calcitriol, Renvela.Calcium and phosphorus are reasonably well controlled.  # HTN/volume: Blood pressure and volume status looks acceptable.  On metoprolol, Imdur and Lasix.  Subjective: Seen and examined at bedside.  He is getting ready to go home.  His girlfriend is present.  He reports feeling good without any complaint.  No nausea, vomiting, chest pain, shortness of breath, headache or dizziness.  Objective Vital signs in last 24 hours: Vitals:   12/12/20 1300 12/12/20 1340 12/12/20 2155 12/13/20 0541  BP: (!) 154/90 (!) 160/88 (!) 142/85 117/72  Pulse: (!) 102 (!) 102 97 88  Resp: 20 18 18 18   Temp:  98.4 F (36.9 C) 98.5 F (36.9 C) 98.5 F (36.9 C)  TempSrc:  Oral Oral   SpO2:  96% 95% 96%  Weight:  97.5 kg    Height:       Weight change:   Intake/Output Summary (Last 24 hours) at 12/13/2020  1110 Last data filed at 12/12/2020 1340 Gross per 24 hour  Intake --  Output 2000 ml  Net -2000 ml        Labs: Basic Metabolic Panel: Recent Labs  Lab 12/09/20 1357 12/09/20 1623 12/12/20 0602  NA 138  --  139  K 5.4*  --  4.9  CL 103  --  101  CO2 23  --  25  GLUCOSE 101*  --  77  BUN 65*  --  54*  CREATININE 12.68*  --  12.57*  CALCIUM 9.2  --  9.6  PHOS  --  5.3* 5.5*    Liver Function Tests: Recent Labs  Lab 12/09/20 1357 12/12/20 0602  AST 15  --   ALT 17  --   ALKPHOS 76  --   BILITOT 0.9  --   PROT 7.7  --   ALBUMIN 3.7 3.5    No results for input(s): LIPASE, AMYLASE in the last 168 hours. Recent Labs  Lab 12/11/20 1050  AMMONIA 20    CBC: Recent Labs  Lab 12/09/20 1357 12/12/20 0602  WBC 9.7 8.4  NEUTROABS 7.7  --   HGB 11.0* 11.4*  HCT 34.3* 36.7*  MCV 90.3 90.8  PLT 283 246    Cardiac Enzymes: No results for input(s): CKTOTAL, CKMB, CKMBINDEX, TROPONINI in the last 168 hours. CBG: Recent Labs  Lab  12/11/20 1621 12/12/20 0830 12/12/20 0914 12/12/20 1136 12/13/20 0542  GLUCAP 69* 60* 81 93 87     Iron Studies: No results for input(s): IRON, TIBC, TRANSFERRIN, FERRITIN in the last 72 hours. Studies/Results: No results found.  Medications: Infusions:   Scheduled Medications:  amitriptyline  75 mg Oral QHS   calcitRIOL  0.25 mcg Oral Q T,Th,Sa-HD   cephALEXin  250 mg Oral Q12H   Chlorhexidine Gluconate Cloth  6 each Topical Daily   Chlorhexidine Gluconate Cloth  6 each Topical Q0600   Chlorhexidine Gluconate Cloth  6 each Topical Q0600   darbepoetin (ARANESP) injection - DIALYSIS  60 mcg Intravenous Q Tue-HD   FLUoxetine  10 mg Oral Daily   furosemide  40 mg Oral Daily   gabapentin  300 mg Oral TID   heparin  5,000 Units Subcutaneous Q8H   isosorbide mononitrate  15 mg Oral Daily   lamoTRIgine  100 mg Oral q morning   lamoTRIgine  125 mg Oral QHS   levETIRAcetam  500 mg Oral Daily   metoprolol tartrate  25 mg  Oral BID   multivitamin  1 tablet Oral QHS   pantoprazole  40 mg Oral Daily   ranolazine  500 mg Oral BID   sevelamer carbonate  1,600 mg Oral TID WC    have reviewed scheduled and prn medications.  Physical Exam: General: Sitting on bed comfortable, not in distress Heart:RRR, s1s2 nl Lungs: Clear b/l, no crackle Abdomen:soft, Non-tender, non-distended Extremities: Trace leg edema present. Dialysis Access: [facility] in place, left upper extremity AV fistula is maturing.  Max Pittman 12/13/2020,11:10 AM  LOS: 3 days

## 2020-12-16 ENCOUNTER — Encounter: Payer: Self-pay | Admitting: Internal Medicine

## 2020-12-16 NOTE — Progress Notes (Signed)
[facility] DEVICE PROGRAMMING  Patient Information: Name:  Pravin Perezperez.  DOB:  February 03, 1975  MRN:  [id number]    Patient: Perez Dirico, DOB 1975/08/26  Planned Procedure:  Revision left arm basilic vein AV fistula  Surgeon:  Dr. Harold Barban  Date of Procedure:  12/20/20  Cautery will be used.  Position during surgery:     Please send documentation back to:  [facility] (Fax # [phone])  Device Information:  Clinic EP Physician:  [name], MD   Device Type:  Medtronic Loop Recorder [facility]) Manufacturer and Phone #:  [employer]onic: [phone]  No reprogramming needed/ monitoring device    Per Device Clinic Standing Orders, Theodoro Doing BSN, [place]  3:12 [place] 12/16/2020

## 2020-12-17 ENCOUNTER — Encounter (HOSPITAL_COMMUNITY): Payer: Self-pay | Admitting: Surgery

## 2020-12-17 ENCOUNTER — Other Ambulatory Visit: Payer: Self-pay

## 2020-12-17 NOTE — Progress Notes (Signed)
Spoke with pt's significant other, Norton Pastel for pre-op call. DPR on file. Pt has an extensive health history, has has numerous surgeries. [name] states pt has a hx of seizures but usually more staring type of seizures. She stated that he was admitted to [facility] on 12/09/20 after having a tonic clonic type of seizure with a prolonged post-ictal states. She states they have increased his Lamotrigine and has not had any more seizures since. Pt is Diabetic. No longer on medications. She states his fasting blood sugar is usually in the 80's. The last A1C was 6.2 on 03/07/20.   Pt has a loop recorder due to history of syncopal episodes.   Pt's surgery is scheduled as ambulatory so no Covid test is required prior to surgery.

## 2020-12-20 ENCOUNTER — Ambulatory Visit (HOSPITAL_COMMUNITY): Admission: RE | Admit: 2020-12-20 | Payer: Medicare Other | Source: Ambulatory Visit | Admitting: Surgery

## 2020-12-20 ENCOUNTER — Encounter (HOSPITAL_COMMUNITY): Payer: Self-pay | Admitting: Certified Registered"

## 2020-12-20 HISTORY — DX: Presence of other cardiac implants and grafts: Z95.818

## 2020-12-20 HISTORY — DX: Acute myocardial infarction, unspecified: I21.9

## 2020-12-20 SURGERY — REVISON OF ARTERIOVENOUS FISTULA
Anesthesia: Choice | Laterality: Left

## 2020-12-20 NOTE — Progress Notes (Signed)
Called patient with no answer. Voicemail left.

## 2020-12-27 ENCOUNTER — Other Ambulatory Visit: Payer: Self-pay

## 2020-12-27 MED ORDER — AMITRIPTYLINE HCL 75 MG PO TABS: 75.0000 mg | ORAL_TABLET | Freq: Every day | ORAL | 1 refills | Status: DC

## 2020-12-27 NOTE — Telephone Encounter (Signed)
The pharmacy is sending a refill request for Amitriptyline 75 mg. Would you like to refill?

## 2020-12-31 NOTE — Progress Notes (Deleted)
Subjective:    Patient ID: Max Pro., male    DOB: 1975-02-27, 45 y.o.   MRN: [id number]  HPI  Max Pittman is a 45 year old man who presents for hospital f/u of the following medical issues:  1.  Functional and mobility deficits secondary to debility after cardiac arrest, volume overload multiple medical.             -has been receiving home therapy, would like to continue this   -as per his girlfriend at bedside, it would be hard for him to get to outpatient therapy appointments with his dialysis schedule  2.  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 He has been taking the oxycodone every 6 hours at night. He   -Cymbalta caused bad nightmares.   -currently taking the oxycodone 4x per day    Pain Inventory Average Pain 6 Pain Right Now 7 My pain is sharp, stabbing, tingling and aching  In the last 24 hours, has pain interfered with the following? General activity 9 Relation with others 9 Enjoyment of life 3 What TIME of day is your pain at its worst? varies Sleep (in general) Fair  Pain is worse with: walking, bending, sitting and standing Pain improves with: rest and medication Relief from Meds: 7  ability to climb steps?  no do you drive?  no use a wheelchair needs help with transfers  disabled: date disabled . I need assistance with the following:  dressing, bathing, meal prep and shopping  weakness numbness tingling trouble walking spasms confusion anxiety  Any changes since last visit?  no   Dialysis M-W-F Any changes since last visit?  no    Family History  Problem Relation Age of Onset   Heart disease Mother    Diabetes Mother    Hypertension Mother    Diabetes Father     Hypertension Father    CAD Mother        "angina"   Hypercholesterolemia Mother    Hypercholesterolemia Father    Healthy Brother    Social History   Socioeconomic History   Marital status: Single    Spouse name: Not on file   Number of children: Not on file   Years of education: Not on file   Highest education level: Not on file  Occupational History   Occupation: Unemployed  Tobacco Use   Smoking status: Former    Types: Cigarettes    Quit date: 11/16/2018    Years since quitting: 2.1   Smokeless tobacco: Never  Vaping Use   Vaping Use: Never used  Substance and Sexual Activity   Alcohol use: Not Currently    Comment: occ   Drug use: Never   Sexual activity: Not Currently  Other Topics Concern   Not on file  Social History Narrative   ** Merged History Encounter **       Social Determinants of Health   Financial Resource Strain: Not on file  Food Insecurity: Not on file  Transportation Needs: Not on file  Physical Activity: Not on file  Stress: Not on file  Social Connections: Not on file   Past Surgical History:  Procedure Laterality Date   A/V FISTULAGRAM  Left 11/26/2020   Procedure: A/V FISTULAGRAM;  Surgeon: Serafina Mitchell, MD;  Location: [place] [facility];  Service: Cardiovascular;  Laterality: Left;   AMPUTATION Right 07/21/2019   Procedure: Right index finger amputation as necessary at distal interphalangeal joint;  Surgeon: Roseanne Kaufman, MD;  Location: [place];  Service: Orthopedics;  Laterality: Right;  60 mins   APPLICATION OF WOUND VAC Left 05/22/2019   Procedure: APPLICATION OF WOUND VAC  LEFT HIP;  Surgeon: Altamese [place], MD;  Location: [place];  Service: Orthopedics;  Laterality: Left;   APPLICATION OF WOUND VAC Left 07/17/2019   Procedure: WOUND VAC CHANGE;  Surgeon: Altamese Tullahassee, MD;  Location: [place];  Service: Orthopedics;  Laterality: Left;   AV FISTULA INSERTION W/ RF MAGNETIC GUIDANCE Left    AV FISTULA PLACEMENT     AV FISTULA  PLACEMENT Left 04/08/2020   Procedure: ARTERIOVENOUS (AV) FISTULA CREATION;  Surgeon: Angelia Mould, MD;  Location: [place];  Service: Vascular;  Laterality: Left;   [place] Left 08/06/2020   Procedure: LEFT ARM SECOND STAGE [place];  Surgeon: Elam Dutch, MD;  Location: [place];  Service: Vascular;  Laterality: Left;   BUBBLE STUDY  05/25/2019   Procedure: BUBBLE STUDY;  Surgeon: Dorothy Spark, MD;  Location: [place];  Service: Cardiovascular;;   CHOLECYSTECTOMY     HERNIA REPAIR     HIP CLOSED REDUCTION Left 05/20/2019   Procedure: CLOSED REDUCTION HIP WITH TRACTION PIN APPLICATION;  Surgeon: Altamese [place]oulder, MD;  Location: [place];  Service: Orthopedics;  Laterality: Left;   INCISION AND DRAINAGE HIP Left 07/14/2019   Procedure: IRRIGATION AND DEBRIDEMENT HIP;  Surgeon: Altamese [place], MD;  Location: [place];  Service: Orthopedics;  Laterality: Left;   INCISION AND DRAINAGE HIP Left 07/17/2019   Procedure: REPEAT IRRIGATION AND DEBRIDEMENT LEFT HIP;  Surgeon: Altamese [place], MD;  Location: [place];  Service: Orthopedics;  Laterality: Left;   IR FLUORO GUIDE CV LINE RIGHT  05/23/2019   IR FLUORO GUIDE CV LINE RIGHT  07/21/2019   IR PERC TUN PERIT CATH WO PORT S&I /IMAG  03/20/2020   IR REMOVAL TUN CV CATH W/O FL  08/29/2019   IR US GUIDE VASC ACCESS RIGHT  05/23/2019   IR US GUIDE VASC ACCESS RIGHT  07/21/2019   IR US GUIDE VASC ACCESS RIGHT  03/20/2020   JOINT REPLACEMENT     LEFT HEART CATH AND CORONARY ANGIOGRAPHY N/A 03/22/2019   Procedure: LEFT HEART CATH AND CORONARY ANGIOGRAPHY;  Surgeon: Wellington Hampshire, MD;  Location: [place] [facility];  Service: Cardiovascular;  Laterality: N/A;   LOOP RECORDER INSERTION N/A 05/25/2019   Procedure: LOOP RECORDER INSERTION;  Surgeon: [name], MD;  Location: [place] [facility];  Service: Cardiovascular;  Laterality: N/A;   MINOR REMOVAL OF PERITONEAL DIALYSIS CATHETER N/A 03/22/2020    Procedure: REMOVAL OF PERITONEAL DIALYSIS CATHETER;  Surgeon: Kieth Brightly, Arta Bruce, MD;  Location: [place];  Service: General;  Laterality: N/A;  ROOM 1 STATING AT 11:00AM FOR 60 MIN   ORIF ACETABULAR FRACTURE Left 05/22/2019   Procedure: OPEN REDUCTION INTERNAL FIXATION (ORIF) T TYPE  WITH ASSOCIATED POSTERIOR WALL ACETABULAR FRACTURE, LEFT; REMOVAL OF TRACTION PIN LEFT TIBIA;  Surgeon: Altamese [place], MD;  Location: [place];  Service: Orthopedics;  Laterality: Left;   REMOVAL OF A DIALYSIS CATHETER Right 07/17/2019   Procedure: REMOVAL OF HEMODIALYSIS DIALYSIS CATHETER;  Surgeon: Altamese Aristes, MD;  Location: [place];  Service: Orthopedics;  Laterality: Right;   TEE WITHOUT CARDIOVERSION N/A 05/25/2019   Procedure: TRANSESOPHAGEAL ECHOCARDIOGRAM (TEE);  Surgeon: Dorothy Spark, MD;  Location: [place];  Service: Cardiovascular;  Laterality: N/A;   TEE WITHOUT CARDIOVERSION N/A 07/19/2019   Procedure: TRANSESOPHAGEAL ECHOCARDIOGRAM (TEE);  Surgeon: Sueanne Margarita, MD;  Location: [facility];  Service: Cardiovascular;  Laterality: N/A;   TONSILLECTOMY     Past Medical History:  Diagnosis Date   Anxiety    Arthritis    Bipolar disorder ([place])    Closed dislocation of left hip ([place]) 05/21/2019   Diabetes ([place])    Diabetes mellitus without complication (HCC)    no meds, type 2   DVT (deep venous thrombosis) (HCC)    Fibromyalgia    History of blood transfusion    w/surgery   History of peritoneal dialysis    HLD (hyperlipidemia)    on lipitor   Hypertension    Myocardial infarction [facility])    Peripheral vascular disease ([place])    Renal disorder    FFGS - dialysis mon wed fri   Renal disorder    Seizure ([place]) 08/04/2020   "SILENT SEIZURE" PER PATIENT   Sleep apnea    does not use cpap   Status post placement of implantable loop recorder    Stroke ([place])    when ha was a child and as adult 03/07/20   Vasovagal syndrome    with syncope   There were no vitals taken for this  visit.  Opioid Risk Score:   Fall Risk Score:  `1  Depression screen PHQ 2/9  Depression screen [facility] 2/9 09/05/2020 05/23/2020 12/18/2019 10/16/2019 08/29/2019 06/29/2019  Decreased Interest 0 1 0 0 0 2  Down, Depressed, Hopeless 0 0 0 0 0 0  PHQ - 2 Score 0 1 0 0 0 2  Altered sleeping - 3 - - - 2  Tired, decreased energy - 2 - - - 2  Change in appetite - 3 - - - 2  Feeling bad or failure about yourself  - 0 - - - 2  Trouble concentrating - 3 - - - 3  Moving slowly or fidgety/restless - 2 - - - 0  Suicidal thoughts - 0 - - - 0  PHQ-9 Score - 14 - - - 13    Review of Systems     Objective:   Physical Exam Gen: no distress, normal appearing HEENT: oral mucosa pink and moist, NCAT Cardio: Reg rate Chest: normal effort, normal rate of breathing Abd: soft, non-distended Ext: no edema Psych: pleasant, normal affect Skin: no new bleeding from access site.  Musc: Right lower extremity edema Bilateral lower extremity tenderness, mild Neuro: Alert Motor: Bilateral upper extremities: 4+/5 proximal distal RLE limited by pain 3/5 prox to 4/5 distally. LLE 3/5 prox to 4/5 distally.       Assessment & Plan:  1.  Functional and mobility deficits secondary to debility after cardiac arrest, volume overload multiple medical.             -continue home therapy, I will review notes and correspond with home therapists.   -discussed transition to outpatient therapy, but as per his girlfriend at bedside, it would be hard for him to get to outpatient therapy appointments with his dialysis schedule  2.  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 speciality clinic next week  -has  been able to weight bear with pain to get to the bathroom  -Average pain is 6/10, pain right now is 7/10. He has been taking the oxycodone every 6 hours as needed. Discussed timed voiding  and timing oxycodone 30 minutes prior to using bathroom to help with pain  -Cymbalta caused bad nightmares.   3. Insomnia: increase amitriptyline to 25mg   4. Diabetic peripheral neuropathy: -Discussed Qutenza as an option for neuropathic pain control. Discussed that this is a capsaicin patch, stronger than capsaicin cream. Discussed that it is currently approved for diabetic peripheral neuropathy and post-herpetic neuralgia, but that it has also shown benefit in treating other forms of neuropathy. Provided patient with link to site to learn more about the patch: [employer]. Discussed that the patch would be placed in office and benefits usually last 3 months. Discussed that unintended exposure to capsaicin can cause severe irritation of eyes, mucous membranes, respiratory tract, and skin, but that Qutenza is a local treatment and does not have the systemic side effects of other nerve medications. Discussed that there may be pain, itching, erythema, and decreased sensory function associated with the application of Qutenza. Side effects usually subside within 1 week. A cold pack of analgesic medications can help with these side effects. Blood pressure can also be increased due to pain associated with administration of the patch.

## 2021-01-01 LAB — CUP PACEART REMOTE DEVICE CHECK
Date Time Interrogation Session: 20221206201916
Implantable Pulse Generator Implant Date: 20210429

## 2021-01-03 ENCOUNTER — Encounter
Payer: Medicare Other | Attending: Physical Medicine and Rehabilitation | Admitting: Physical Medicine and Rehabilitation

## 2021-01-04 ENCOUNTER — Encounter: Payer: Self-pay | Admitting: Physical Medicine and Rehabilitation

## 2021-01-06 ENCOUNTER — Ambulatory Visit: Payer: Medicare Other | Admitting: Adult Health

## 2021-01-06 ENCOUNTER — Ambulatory Visit (INDEPENDENT_AMBULATORY_CARE_PROVIDER_SITE_OTHER): Payer: Medicare Other

## 2021-01-06 ENCOUNTER — Telehealth: Payer: Self-pay | Admitting: Adult Health

## 2021-01-06 DIAGNOSIS — I639 Cerebral infarction, unspecified: Secondary | ICD-10-CM | POA: Diagnosis not present
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
## 2021-01-06 NOTE — Telephone Encounter (Signed)
Pt is sick had to reschedule appt for today.

## 2021-01-06 NOTE — Telephone Encounter (Signed)
Noted thank you

## 2021-01-14 NOTE — Progress Notes (Signed)
[facility] Summary Report / Loop Recorder 

## 2021-01-23 ENCOUNTER — Encounter (HOSPITAL_COMMUNITY): Payer: Self-pay | Admitting: Physician Assistant

## 2021-01-23 NOTE — Progress Notes (Signed)
Anesthesia Chart Review: Same day workup  Hx of coronary vasospasm (Cardiac cath 03/22/2019 showed mid LAD lesion 20% stenosis.  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  He presented to [name] ED on 03/07/20 with with cardiac arrest due to "massive volume overload" 2/2 PD membrane failure (158 kg on admission, 119 kg at discharge). He required 12 minutes of CPR with ROSC, was intubated in ED and treated with cooling protocol. Self extubated on 01/08/2021 and respiratory status remained stable. Since his admission, his PD cath was removed and a right IJ tunnelled TDC was placed on 03/20/2020 by interventional radiology.   ESRD on HD. Underwent Left arm second stage basilic vein transposition 08/06/20.   Follows with neurology for history of CVA and seizures. Recent admission November 2022 for tonic clonic seizure and acute metabolic encephalopathy felt secondary to change in dialysis schedule. EEG showed no seizure activity. Bupropion was discontinued. Lamictal increased to 125 mg at hs and continue 100mg  q AM. Keppra 500mg /day continued.    Of note, multiple notes in pt's electronic record report history of TAVR with bioprosthetic valve. However, I do not find any consistent documentation of this. TTE 05/22/19 and TEE 05/25/19 state normal AV, no mention of bioprosthetic valve. TTE 07/15/19 summary states the aortic valve is normal in structure and function, but later in the report it states, "Echo findings are consistent  with normal structure and function of the aortic valve prosthesis." TEE 07/19/19 reports normal AV, no mention of bioprosthetic valve. TTE 03/07/20 states AV is grossly normal, no mention of bioprosthetic valve. Patient denies history of AVR.   Will need DOS labs and eval.   EKG 04/01/20: NSR. Rate 86.   TTE 03/07/20:  1. Left ventricular ejection fraction, by estimation, is 55 to 60%. The  left ventricle has normal function. The left ventricle has no regional  wall motion abnormalities. There is moderate concentric left ventricular  hypertrophy. Left ventricular  diastolic parameters are indeterminate.   2. Right ventricular systolic function is normal. The right ventricular  size is normal.   3. The mitral valve is normal in structure. Trivial mitral valve  regurgitation. No evidence of mitral stenosis.   4. The aortic valve is grossly normal. Aortic valve regurgitation is not  visualized. No aortic stenosis is present.   Comparison(s): No significant change from prior study.    LHC 03/22/19: 1.  Moderate one-vessel coronary artery disease involving the proximal right coronary artery.  Significant spasm noted in the ostial proximal RCA improved significantly with intracoronary nitroglycerin.  No other obstructive disease noted. 2.  Normal LV systolic function and mildly elevated left ventricular end-diastolic pressure.   Recommendations: No clear culprit is identified for non-ST elevation myocardial infarction.  Right coronary spasm is a possibility.  Treating this might be difficult given the patient's recurrent syncope and hypotension.  I am going to add small dose Imdur 15 mg daily. Continue aggressive treatment of risk factors.     Wynonia Musty [facility]Anesthesiology Phone [phone] 01/23/2021 10:53 AM

## 2021-01-23 NOTE — Anesthesia Preprocedure Evaluation (Deleted)
Anesthesia Evaluation    Airway        Dental   Pulmonary former smoker,           Cardiovascular hypertension,      Neuro/Psych    GI/Hepatic   Endo/Other  diabetes  Renal/GU      Musculoskeletal   Abdominal   Peds  Hematology   Anesthesia Other Findings   Reproductive/Obstetrics                             Anesthesia Physical Anesthesia Plan  ASA:   Anesthesia Plan:    Post-op Pain Management:    Induction:   PONV Risk Score and Plan:   Airway Management Planned:   Additional Equipment:   Intra-op Plan:   Post-operative Plan:   Informed Consent:   Plan Discussed with:   Anesthesia Plan Comments: (PAT note by Karoline Caldwell, PA-C: Hx ofcoronary vasospasm (Cardiac cath 03/22/2019 showed mid LAD lesion 20% stenosis. 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 [facility] with with cardiac arrest due to "massive volume overload"2/2 PD membrane failure (158 kg on admission, 119 kg at discharge). He required 12 minutes of CPR with ROSC, was intubated in ED and treated with cooling protocol.Self extubated on 12/14/2022and respiratory status remained stable.Since his admission, his PD cathwasremoved and a right IJ tunnelled TDCwasplacedon 03/20/2020 by interventional radiology.  ESRD on HD. Underwent Left arm second stage basilic vein transposition 08/06/20.  Follows  with neurology for history of CVA and seizures. Recent admission November 2022 for tonic clonic seizure and acute metabolic encephalopathy felt secondary to change in dialysis schedule. EEG showed no seizure activity. Bupropion was discontinued. Lamictal increased to 125 mg at hs and continue 100mg  q AM. Keppra 500mg /day continued.   Of note, multiple notes in pt's electronic record report history of TAVR with bioprosthetic valve. However, I do not find any consistent documentation of this. TTE 05/22/19 and TEE 05/25/19 state normal AV, no mention of bioprosthetic valve. TTE 07/15/19 summary states the aortic valve is normal in structure and function, but later in the report it states, "Echo findings are consistent with normal structure and function of the aortic valve prosthesis." TEE 07/19/19 reports normal AV, no mention of bioprosthetic valve. TTE 03/07/20 states AV is grossly normal, no mention of bioprosthetic valve. Patient denies history of AVR.  Will need DOS labs and eval.  EKG 04/01/20: NSR. Rate 86.  TTE 03/07/20: 1. Left ventricular ejection fraction, by estimation, is 55 to 60%. The  left ventricle has normal function. The left ventricle has no regional  wall motion abnormalities. There is moderate concentric left ventricular  hypertrophy. Left ventricular  diastolic parameters are indeterminate.  2. Right ventricular systolic function is normal. The right ventricular  size is normal.  3. The mitral valve is normal in structure. Trivial mitral valve  regurgitation. No evidence of mitral stenosis.  4. The aortic valve is grossly normal. Aortic valve regurgitation is not  visualized. No aortic stenosis is present.   Comparison(s): No significant change from prior study.  LHC 03/22/19: 1. Moderate one-vessel coronary artery disease involving the proximal right coronary artery. Significant spasm noted in the ostial proximal RCA improved significantly with intracoronary  nitroglycerin. No other obstructive disease noted. 2. Normal LV systolic function and mildly elevated left ventricular end-diastolic pressure.  Recommendations: No clear culprit is identified for non-ST elevation myocardial infarction. Right coronary spasm is a possibility. Treating this might be difficult given the patient's recurrent syncope and hypotension. I am going to add small dose Imdur 15 mg daily. Continue aggressive treatment of risk factors.   )        Anesthesia Quick Evaluation

## 2021-01-23 NOTE — Progress Notes (Addendum)
I have called Mr. Lemme cell phone as well as his significant other's number, neither has called me back.  I called the nurses desk at Dr. Stephens Shire office, the voice message said that they are  closed., same at the front dest. I called to see if the office has a different phone number for Mr. Arrighi, I will continue to call the numbers that I have.  I did not get an answer on Mr. Maser phone, "call cannot go through at this time," Significant other that we have permission to speak with , I got the voice mail. I was unable to reach patient by phone.  I instructed the patient to arrive at [place] entrance at 0530, register in the [place]. DO NOT eat or drink anything after midnight.  I instructed the patient to take the following medications in the am with just enough water to get them down: Baclofen, Prozac, Gabapentin, Imdur, Lamitical, Keppra, Antivert, Metoprolol, Prilosec, Ranexa.  Prn Atarax,  Percocet.  I instructed patient to wash up very welI with antibiotic soap, dry off with a clean towel. asked patient to not wear any lotions, powders, cologne, jewelry, piercing, make-up or nail polish.  Wear clean clothes. Brush teeth. I informed patient that there will need to be a driver and someone to stay with him/her for the first 24 hours after surgery.   I instructed  patient to call [phone], in the am if there were any questions or problems.

## 2021-01-24 ENCOUNTER — Ambulatory Visit (HOSPITAL_COMMUNITY): Admission: RE | Admit: 2021-01-24 | Payer: Medicare Other | Source: Home / Self Care | Admitting: Surgery

## 2021-01-24 SURGERY — REVISON OF ARTERIOVENOUS FISTULA
Anesthesia: Choice | Laterality: Left

## 2021-01-26 ENCOUNTER — Encounter: Payer: Self-pay | Admitting: Adult Health

## 2021-01-27 ENCOUNTER — Encounter: Payer: Self-pay | Admitting: Adult Health

## 2021-01-28 ENCOUNTER — Ambulatory Visit: Payer: Medicare Other | Admitting: Adult Health

## 2021-01-29 NOTE — Progress Notes (Signed)
Subjective:    Patient ID: Max Pro., male    DOB: Aug 17, 1975, 46 y.o.   MRN: [id number]  HPI  Max Pittman is a 46 year old man who presents for hospital f/u of the following medical issues:  1.  Functional and mobility deficits secondary to debility after cardiac arrest, volume overload multiple medical.             -has been receiving home therapy, would like to continue this   -as per his girlfriend at bedside, it would be hard for him to get to outpatient therapy appointments with his dialysis schedule  2.  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 He has been taking the oxycodone every 6 hours at night. He   -Cymbalta caused bad nightmares.   -currently taking the oxycodone 4x per day    Pain Inventory Average Pain 6 Pain Right Now 7 My pain is sharp, stabbing, tingling and aching  In the last 24 hours, has pain interfered with the following? General activity 9 Relation with others 9 Enjoyment of life 3 What TIME of day is your pain at its worst? varies Sleep (in general) Fair  Pain is worse with: walking, bending, sitting and standing Pain improves with: rest and medication Relief from Meds: 7  ability to climb steps?  no do you drive?  no use a wheelchair needs help with transfers  disabled: date disabled . I need assistance with the following:  dressing, bathing, meal prep and shopping  weakness numbness tingling trouble walking spasms confusion anxiety  Any changes since last visit?  no   Dialysis M-W-F Any changes since last visit?  no    Family History  Problem Relation Age of Onset  . Heart disease Mother   . Diabetes Mother   . Hypertension Mother   . Diabetes Father    . Hypertension Father   . CAD Mother        "angina"  . Hypercholesterolemia Mother   . Hypercholesterolemia Father   . Healthy Brother    Social History   Socioeconomic History  . Marital status: Single    Spouse name: Not on file  . Number of children: Not on file  . Years of education: Not on file  . Highest education level: Not on file  Occupational History  . Occupation: Unemployed  Tobacco Use  . Smoking status: Former    Years: 22.00    Types: Cigarettes    Quit date: 11/16/2018    Years since quitting: 2.9  . Smokeless tobacco: Never  Vaping Use  . Vaping Use: Former  . Quit date: 03/27/2019  Substance and Sexual Activity  . Alcohol use: Not Currently    Comment: occ  . Drug use: Never  . Sexual activity: Not Currently  Other Topics Concern  . Not on file  Social History Narrative   ** Merged History Encounter **       Social Determinants of Health   Financial Resource Strain: Not on file  Food Insecurity: Not on file  Transportation Needs: Not on file  Physical Activity: Not on file  Stress: Not on file  Social Connections: Not on file   Past Surgical  History:  Procedure Laterality Date  . A/V FISTULAGRAM Left 11/26/2020   Procedure: A/V FISTULAGRAM;  Surgeon: Serafina Mitchell, MD;  Location: [place] [facility];  Service: Cardiovascular;  Laterality: Left;  . AMPUTATION Right 07/21/2019   Procedure: Right index finger amputation as necessary at distal interphalangeal joint;  Surgeon: Roseanne Kaufman, MD;  Location: [place];  Service: Orthopedics;  Laterality: Right;  60 mins  . APPLICATION OF WOUND VAC Left 05/22/2019   Procedure: APPLICATION OF WOUND VAC  LEFT HIP;  Surgeon: Altamese Liberty, MD;  Location: [place];  Service: Orthopedics;  Laterality: Left;  . APPLICATION OF WOUND VAC Left 07/17/2019   Procedure: WOUND VAC CHANGE;  Surgeon: Altamese Ellenton, MD;  Location: [place];  Service: Orthopedics;  Laterality: Left;  . AV FISTULA INSERTION W/ RF  MAGNETIC GUIDANCE Left   . AV FISTULA PLACEMENT    . AV FISTULA PLACEMENT Left 04/08/2020   Procedure: ARTERIOVENOUS (AV) FISTULA CREATION;  Surgeon: Angelia Mould, MD;  Location: [place];  Service: Vascular;  Laterality: Left;  . BASCILIC VEIN TRANSPOSITION Left 08/06/2020   Procedure: LEFT ARM SECOND STAGE BASCILIC VEIN TRANSPOSITION;  Surgeon: Elam Dutch, MD;  Location: [place];  Service: Vascular;  Laterality: Left;  . BUBBLE STUDY  05/25/2019   Procedure: BUBBLE STUDY;  Surgeon: Dorothy Spark, MD;  Location: [place];  Service: Cardiovascular;;  . CHOLECYSTECTOMY    . HERNIA REPAIR    . HIP CLOSED REDUCTION Left 05/20/2019   Procedure: CLOSED REDUCTION HIP WITH TRACTION PIN APPLICATION;  Surgeon: Altamese Westfield, MD;  Location: [place];  Service: Orthopedics;  Laterality: Left;  . INCISION AND DRAINAGE HIP Left 07/14/2019   Procedure: IRRIGATION AND DEBRIDEMENT HIP;  Surgeon: Altamese Rennerdale, MD;  Location: [place];  Service: Orthopedics;  Laterality: Left;  . INCISION AND DRAINAGE HIP Left 07/17/2019   Procedure: REPEAT IRRIGATION AND DEBRIDEMENT LEFT HIP;  Surgeon: Altamese Pinole, MD;  Location: [place];  Service: Orthopedics;  Laterality: Left;  . IR FLUORO GUIDE CV LINE RIGHT  05/23/2019  . IR FLUORO GUIDE CV LINE RIGHT  07/21/2019  . IR PERC TUN PERIT CATH WO PORT S&I D[place]a [place]  03/20/2020  . IR REMOVAL TUN CV CATH W/O FL  08/29/2019  . IR US GUIDE VASC ACCESS RIGHT  05/23/2019  . IR US GUIDE VASC ACCESS RIGHT  07/21/2019  . IR US GUIDE VASC ACCESS RIGHT  03/20/2020  . JOINT REPLACEMENT    . LEFT HEART CATH AND CORONARY ANGIOGRAPHY N/A 03/22/2019   Procedure: LEFT HEART CATH AND CORONARY ANGIOGRAPHY;  Surgeon: Wellington Hampshire, MD;  Location: [place] [facility];  Service: Cardiovascular;  Laterality: N/A;  . LOOP RECORDER INSERTION N/A 05/25/2019   Procedure: LOOP RECORDER INSERTION;  Surgeon: [name], MD;  Location: [place] [facility];  Service: Cardiovascular;   Laterality: N/A;  . MINOR REMOVAL OF PERITONEAL DIALYSIS CATHETER N/A 03/22/2020   Procedure: REMOVAL OF PERITONEAL DIALYSIS CATHETER;  Surgeon: Kieth Brightly, Arta Bruce, MD;  Location: [place];  Service: General;  Laterality: N/A;  ROOM 1 STATING AT 11:00AM FOR 60 MIN  . ORIF ACETABULAR FRACTURE Left 05/22/2019   Procedure: OPEN REDUCTION INTERNAL FIXATION (ORIF) T TYPE  WITH ASSOCIATED POSTERIOR WALL ACETABULAR FRACTURE, LEFT; REMOVAL OF TRACTION PIN LEFT TIBIA;  Surgeon: Altamese Deer Park, MD;  Location: [place];  Service: Orthopedics;  Laterality: Left;  . REMOVAL OF A DIALYSIS CATHETER Right 07/17/2019   Procedure: REMOVAL OF HEMODIALYSIS DIALYSIS CATHETER;  Surgeon: Marcelino Scot,  Legrand Como, MD;  Location: [place];  Service: Orthopedics;  Laterality: Right;  . REVISON OF ARTERIOVENOUS FISTULA Left 03/12/2021   Procedure: REVISON OF LEFT ARM BASILIC VEIN ARTERIOVENOUS FISTULA;  Surgeon: Serafina Mitchell, MD;  Location: [place];  Service: Vascular;  Laterality: Left;  . TEE WITHOUT CARDIOVERSION N/A 05/25/2019   Procedure: TRANSESOPHAGEAL ECHOCARDIOGRAM (TEE);  Surgeon: Dorothy Spark, MD;  Location: [facility];  Service: Cardiovascular;  Laterality: N/A;  . TEE WITHOUT CARDIOVERSION N/A 07/19/2019   Procedure: TRANSESOPHAGEAL ECHOCARDIOGRAM (TEE);  Surgeon: Sueanne Margarita, MD;  Location: [facility] ENDOSCOPY;  Service: Cardiovascular;  Laterality: N/A;  . TONSILLECTOMY     Past Medical History:  Diagnosis Date  . Anemia   . Anxiety   . Arthritis   . Asthma    as a child  . Bipolar disorder ([place])   . Closed dislocation of left hip ([place]) 05/21/2019  . Constipation   . Coronary artery disease   . Diabetes ([place])    03/11/21 diet controlled  . Diabetes mellitus without complication (HCC)    no meds, type 2  . DVT (deep venous thrombosis) ([place]) 03/2019   left leg  . Dyspnea   . ESRD (end stage renal disease) ([place])    TTHSAT  . Fibromyalgia   . History of blood transfusion    w/surgery  . History of blood  transfusion   . History of peritoneal dialysis   . HLD (hyperlipidemia)    on lipitor  . Hypertension   . Insomnia   . Myocardial infarction ([place])   . Peripheral vascular disease ([place])   . PTSD (post-traumatic stress disorder)   . Renal disorder    FFGS - dialysis mon wed fri  . Renal disorder   . Seizure ([place]) 08/04/2020   "SILENT SEIZURE" PER PATIENT  . Sleep apnea    does not use cpap  . Status post placement of implantable loop recorder   . Stroke [facility])    when ha was a child and as adult 03/07/20.  Left side is weaker than right.  . Vasovagal syndrome    with syncope   There were no vitals taken for this visit.  Opioid Risk Score:   Fall Risk Score:  `1  Depression screen [facility] 2/9     09/05/2020    1:35 PM 05/23/2020   11:12 AM 12/18/2019   10:31 AM 10/16/2019    9:10 AM 08/29/2019    3:58 PM 06/29/2019   11:04 AM  Depression screen PHQ 2/9  Decreased Interest 0 1 0 0 0 2  Down, Depressed, Hopeless 0 0 0 0 0 0  PHQ - 2 Score 0 1 0 0 0 2  Altered sleeping  3    2  Tired, decreased energy  2    2  Change in appetite  3    2  Feeling bad or failure about yourself   0    2  Trouble concentrating  3    3  Moving slowly or fidgety/restless  2    0  Suicidal thoughts  0    0  PHQ-9 Score  14    13    Review of Systems     Objective:   Physical Exam Gen: no distress, normal appearing HEENT: oral mucosa pink and moist, NCAT Cardio: Reg rate Chest: normal effort, normal rate of breathing Abd: soft, non-distended Ext: no edema Psych: pleasant, normal affect Skin: no new bleeding from access site.  Musc: Right  lower extremity edema Bilateral lower extremity tenderness, mild Neuro: Alert Motor: Bilateral upper extremities: 4+/5 proximal distal RLE limited by pain 3/5 prox to 4/5 distally. LLE 3/5 prox to 4/5 distally.       Assessment & Plan:  1.  Functional and mobility deficits secondary to debility after cardiac arrest, volume overload multiple medical.              -continue home therapy, I will review notes and correspond with home therapists.   -discussed transition to outpatient therapy, but as per his girlfriend at bedside, it would be hard for him to get to outpatient therapy appointments with his dialysis schedule  2.  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 He has been taking the oxycodone every 6 hours as needed. Discussed timed voiding and timing oxycodone 30 minutes prior to using bathroom to help with pain  -Cymbalta caused bad nightmares.   3. Insomnia: increase amitriptyline to 25mg   4. Diabetic peripheral neuropathy: -Discussed Qutenza as an option for neuropathic pain control. Discussed that this is a capsaicin patch, stronger than capsaicin cream. Discussed that it is currently approved for diabetic peripheral neuropathy and post-herpetic neuralgia, but that it has also shown benefit in treating other forms of neuropathy. Provided patient with link to site to learn more about the patch: [employer]. Discussed that the patch would be placed in office and benefits usually last 3 months. Discussed that unintended exposure to capsaicin can cause severe irritation of eyes, mucous membranes, respiratory tract, and skin, but that Qutenza is a local treatment and does not have the systemic side effects of other nerve medications. Discussed that there may be pain, itching, erythema, and decreased sensory function associated with the application of Qutenza. Side effects usually subside within 1 week. A cold pack of analgesic medications can help with these side effects. Blood pressure can also be increased due to pain associated with administration of the  patch.  This encounter was created in error - please disregard.

## 2021-01-30 ENCOUNTER — Other Ambulatory Visit: Payer: Self-pay

## 2021-01-30 ENCOUNTER — Encounter: Payer: Medicare Other | Admitting: Physical Medicine and Rehabilitation

## 2021-02-10 ENCOUNTER — Ambulatory Visit (INDEPENDENT_AMBULATORY_CARE_PROVIDER_SITE_OTHER): Payer: Medicare Other

## 2021-02-10 DIAGNOSIS — I639 Cerebral infarction, unspecified: Secondary | ICD-10-CM

## 2021-02-10 LAB — CUP PACEART REMOTE DEVICE CHECK
Date Time Interrogation Session: 20230115230852
Implantable Pulse Generator Implant Date: 20210429

## 2021-02-17 ENCOUNTER — Telehealth: Payer: Self-pay

## 2021-02-17 NOTE — Telephone Encounter (Signed)
Multiple attempts have been made to reach patient to reschedule revision of left arm basilic vein AVF. Will mail letter for patient to contact office to schedule.

## 2021-02-20 NOTE — Progress Notes (Signed)
[facility] Summary Report / Loop Recorder 

## 2021-03-05 NOTE — Telephone Encounter (Signed)
Received telephone call from Max Pittman, [place] from [facility] to advise that patient is ready to schedule fistula revision.   Spoke with patient and scheduled patient for Feb 15 at [facility] arriving at 1030 AM. Instructions reviewed. Patient verbalized understanding.

## 2021-03-07 ENCOUNTER — Other Ambulatory Visit: Payer: Self-pay | Admitting: Physical Medicine and Rehabilitation

## 2021-03-11 ENCOUNTER — Other Ambulatory Visit: Payer: Self-pay

## 2021-03-11 ENCOUNTER — Encounter (HOSPITAL_COMMUNITY): Payer: Self-pay | Admitting: Surgery

## 2021-03-11 MED ORDER — HEPARIN SODIUM (PORCINE) 1000 UNIT/ML DIALYSIS: 20.0000 [IU]/kg | INTRAMUSCULAR | Status: DC | PRN

## 2021-03-11 NOTE — Progress Notes (Signed)
Mr. Woerner denies chest pain or shortness of breath. Patient states that he has shortness of breath when is moving, getting out of the wheelchair or bed. Mr. Salls states that he has chest pain daily, patient is on Ranexa and Isosorbide.  Mr. Beale PCP is Dr. Melford Aase at [facility] [address] a [place] office. Cardiologist is- patient does not have a primary cardiologist.  Mr. Latner has a remote cardiac device that will need to be "removed next year."   Mr. Birks has type II diabetes, diet controlled. Mr Seaman checks CBG 3 times a day, numbers run 90-130.  I instructed Mr. Diebel to check CBG after awaking and every 2 hours until arrival  to the hospital.  I Instructed patient if CBG is less than 70 to take 4 Glucose Tablets or 1 tube of Glucose Gel or 1/2 cup of a clear juice. Recheck CBG in 15 minutes if CBG is not over 70 call, pre- op desk at [phone] for further instructions.   I instructed Mr. Krupka to wash up well with antibiotic soap, if it is available.  Dry off with a clean towel. Do not put lotion, powder, cologne or deodorant or makeup.No jewelry or piercings. Men may shave their face and neck. Woman should not shave. No nail polish, artificial or acrylic nails. Wear clean clothes, brush your teeth. Glasses, contact lens,dentures or partials may not be worn in the OR. If you need to wear them, please bring a case for glasses, do not wear contacts or bring a case, the hospital does not have contact cases, dentures or partials will have to be removed , make sure they are clean, we will provide a denture cup to put them in. You will need some one to drive you home and a responsible person over the age of 59 to stay with you for the first 24 hours after surgery.

## 2021-03-12 ENCOUNTER — Other Ambulatory Visit: Payer: Self-pay

## 2021-03-12 ENCOUNTER — Ambulatory Visit (HOSPITAL_COMMUNITY)
Admission: RE | Admit: 2021-03-12 | Discharge: 2021-03-12 | Disposition: A | Discharge status: Home / Self Care | Payer: Medicare Other | Source: Ambulatory Visit | Attending: Surgery | Admitting: Surgery

## 2021-03-12 ENCOUNTER — Ambulatory Visit (HOSPITAL_COMMUNITY): Payer: Medicare Other | Admitting: Anesthesiology

## 2021-03-12 ENCOUNTER — Encounter (HOSPITAL_COMMUNITY)
Admission: RE | Disposition: A | Discharge status: Home / Self Care | Payer: Self-pay | Source: Ambulatory Visit | Attending: Surgery

## 2021-03-12 ENCOUNTER — Ambulatory Visit (HOSPITAL_BASED_OUTPATIENT_CLINIC_OR_DEPARTMENT_OTHER): Payer: Medicare Other | Admitting: Anesthesiology

## 2021-03-12 ENCOUNTER — Encounter (HOSPITAL_COMMUNITY): Payer: Self-pay | Admitting: Surgery

## 2021-03-12 DIAGNOSIS — I2510 Atherosclerotic heart disease of native coronary artery without angina pectoris: Secondary | ICD-10-CM | POA: Insufficient documentation

## 2021-03-12 DIAGNOSIS — I120 Hypertensive chronic kidney disease with stage 5 chronic kidney disease or end stage renal disease: Secondary | ICD-10-CM | POA: Insufficient documentation

## 2021-03-12 DIAGNOSIS — T82510A Breakdown (mechanical) of surgically created arteriovenous fistula, initial encounter: Secondary | ICD-10-CM

## 2021-03-12 DIAGNOSIS — Y832 Surgical operation with anastomosis, bypass or graft as the cause of abnormal reaction of the patient, or of later complication, without mention of misadventure at the time of the procedure: Secondary | ICD-10-CM | POA: Diagnosis not present

## 2021-03-12 DIAGNOSIS — K219 Gastro-esophageal reflux disease without esophagitis: Secondary | ICD-10-CM | POA: Diagnosis not present

## 2021-03-12 DIAGNOSIS — E1151 Type 2 diabetes mellitus with diabetic peripheral angiopathy without gangrene: Secondary | ICD-10-CM | POA: Diagnosis not present

## 2021-03-12 DIAGNOSIS — E1122 Type 2 diabetes mellitus with diabetic chronic kidney disease: Secondary | ICD-10-CM | POA: Insufficient documentation

## 2021-03-12 DIAGNOSIS — G4730 Sleep apnea, unspecified: Secondary | ICD-10-CM | POA: Diagnosis not present

## 2021-03-12 DIAGNOSIS — Z992 Dependence on renal dialysis: Secondary | ICD-10-CM | POA: Diagnosis not present

## 2021-03-12 DIAGNOSIS — Z86718 Personal history of other venous thrombosis and embolism: Secondary | ICD-10-CM | POA: Insufficient documentation

## 2021-03-12 DIAGNOSIS — T82590A Other mechanical complication of surgically created arteriovenous fistula, initial encounter: Secondary | ICD-10-CM | POA: Insufficient documentation

## 2021-03-12 DIAGNOSIS — Z87891 Personal history of nicotine dependence: Secondary | ICD-10-CM | POA: Insufficient documentation

## 2021-03-12 DIAGNOSIS — Z79899 Other long term (current) drug therapy: Secondary | ICD-10-CM | POA: Diagnosis not present

## 2021-03-12 DIAGNOSIS — I252 Old myocardial infarction: Secondary | ICD-10-CM | POA: Diagnosis not present

## 2021-03-12 DIAGNOSIS — T82898A Other specified complication of vascular prosthetic devices, implants and grafts, initial encounter: Secondary | ICD-10-CM | POA: Diagnosis not present

## 2021-03-12 DIAGNOSIS — N186 End stage renal disease: Secondary | ICD-10-CM | POA: Insufficient documentation

## 2021-03-12 HISTORY — DX: Constipation, unspecified: K59.00

## 2021-03-12 HISTORY — DX: End stage renal disease: N18.6

## 2021-03-12 HISTORY — DX: Insomnia, unspecified: G47.00

## 2021-03-12 HISTORY — DX: Anemia, unspecified: D64.9

## 2021-03-12 HISTORY — DX: Post-traumatic stress disorder, unspecified: F43.10

## 2021-03-12 HISTORY — DX: Unspecified asthma, uncomplicated: J45.909

## 2021-03-12 HISTORY — DX: Dyspnea, unspecified: R06.00

## 2021-03-12 HISTORY — PX: REVISON OF ARTERIOVENOUS FISTULA: SHX6074

## 2021-03-12 HISTORY — DX: Atherosclerotic heart disease of native coronary artery without angina pectoris: I25.10

## 2021-03-12 LAB — BASIC METABOLIC PANEL
Anion gap: 13 (ref 5–15)
BUN: 42 mg/dL — ABNORMAL HIGH (ref 6–20)
CO2: 22 mmol/L (ref 22–32)
Calcium: 9.7 mg/dL (ref 8.9–10.3)
Chloride: 101 mmol/L (ref 98–111)
Creatinine, Ser: 8.39 mg/dL — ABNORMAL HIGH (ref 0.61–1.24)
GFR, Estimated: 7 mL/min — ABNORMAL LOW (ref >60)
Glucose, Bld: 85 mg/dL (ref 70–99)
Potassium: 5 mmol/L (ref 3.5–5.1)
Sodium: 136 mmol/L (ref 135–145)

## 2021-03-12 LAB — GLUCOSE, CAPILLARY
Glucose-Capillary: 72 mg/dL (ref 70–99)
Glucose-Capillary: 76 mg/dL (ref 70–99)
Glucose-Capillary: 81 mg/dL (ref 70–99)

## 2021-03-12 LAB — POCT I-STAT, CHEM 8
BUN: 43 mg/dL — ABNORMAL HIGH (ref 6–20)
Calcium, Ion: 1.2 mmol/L (ref 1.15–1.40)
Chloride: 104 mmol/L (ref 98–111)
Creatinine, Ser: 9.3 mg/dL — ABNORMAL HIGH (ref 0.61–1.24)
Glucose, Bld: 88 mg/dL (ref 70–99)
HCT: 39 % (ref 39.0–52.0)
Hemoglobin: 13.3 g/dL (ref 13.0–17.0)
Potassium: 5.1 mmol/L (ref 3.5–5.1)
Sodium: 138 mmol/L (ref 135–145)
TCO2: 25 mmol/L (ref 22–32)

## 2021-03-12 SURGERY — REVISON OF ARTERIOVENOUS FISTULA
Anesthesia: General | Site: Arm Upper | Laterality: Left

## 2021-03-12 MED ORDER — ALBUMIN HUMAN 5 % IV SOLN: INTRAVENOUS | Status: DC | PRN

## 2021-03-12 MED ORDER — LIDOCAINE HCL (CARDIAC) PF 100 MG/5ML IV SOSY
PREFILLED_SYRINGE | INTRAVENOUS | Status: DC | PRN
  Administered 2021-03-12: 60 mg via INTRATRACHEAL

## 2021-03-12 MED ORDER — CHLORHEXIDINE GLUCONATE 4 % EX LIQD: 60.0000 mL | Freq: Once | CUTANEOUS | Status: DC

## 2021-03-12 MED ORDER — LIDOCAINE-EPINEPHRINE (PF) 1 %-1:200000 IJ SOLN
INTRAMUSCULAR | Status: AC
  Filled 2021-03-12: qty 30

## 2021-03-12 MED ORDER — CHLORHEXIDINE GLUCONATE 0.12 % MT SOLN
OROMUCOSAL | Status: AC
  Administered 2021-03-12: 15 mL via OROMUCOSAL
  Filled 2021-03-12: qty 15

## 2021-03-12 MED ORDER — CEFAZOLIN SODIUM-DEXTROSE 2-4 GM/100ML-% IV SOLN
2.0000 g | INTRAVENOUS | Status: AC
Start: 2021-03-12 — End: 2021-03-12
  Administered 2021-03-12: 2 g via INTRAVENOUS

## 2021-03-12 MED ORDER — ACETAMINOPHEN 10 MG/ML IV SOLN: 1000.0000 mg | Freq: Once | INTRAVENOUS | Status: DC | PRN

## 2021-03-12 MED ORDER — PROPOFOL 10 MG/ML IV BOLUS
INTRAVENOUS | Status: DC | PRN
  Administered 2021-03-12: 200 mg via INTRAVENOUS

## 2021-03-12 MED ORDER — ORAL CARE MOUTH RINSE: 15.0000 mL | Freq: Once | OROMUCOSAL | Status: AC

## 2021-03-12 MED ORDER — PHENYLEPHRINE HCL-NACL 20-0.9 MG/250ML-% IV SOLN
INTRAVENOUS | Status: DC | PRN
  Administered 2021-03-12: 75 ug/min via INTRAVENOUS

## 2021-03-12 MED ORDER — FENTANYL CITRATE (PF) 250 MCG/5ML IJ SOLN
INTRAMUSCULAR | Status: DC | PRN
  Administered 2021-03-12 (×2): 50 ug via INTRAVENOUS

## 2021-03-12 MED ORDER — FENTANYL CITRATE (PF) 250 MCG/5ML IJ SOLN
INTRAMUSCULAR | Status: AC
  Filled 2021-03-12: qty 5

## 2021-03-12 MED ORDER — CEFAZOLIN SODIUM-DEXTROSE 2-4 GM/100ML-% IV SOLN
INTRAVENOUS | Status: AC
  Filled 2021-03-12: qty 100

## 2021-03-12 MED ORDER — CHLORHEXIDINE GLUCONATE 0.12 % MT SOLN: 15.0000 mL | Freq: Once | OROMUCOSAL | Status: AC

## 2021-03-12 MED ORDER — HEPARIN 6000 UNIT IRRIGATION SOLUTION
Status: AC
  Filled 2021-03-12: qty 500

## 2021-03-12 MED ORDER — ONDANSETRON HCL 4 MG/2ML IJ SOLN
INTRAMUSCULAR | Status: DC | PRN
  Administered 2021-03-12: 4 mg via INTRAVENOUS

## 2021-03-12 MED ORDER — INSULIN ASPART 100 UNIT/ML IJ SOLN: 0.0000 [IU] | INTRAMUSCULAR | Status: DC | PRN

## 2021-03-12 MED ORDER — HEPARIN 6000 UNIT IRRIGATION SOLUTION
Status: DC | PRN
  Administered 2021-03-12: 1

## 2021-03-12 MED ORDER — FENTANYL CITRATE (PF) 100 MCG/2ML IJ SOLN
INTRAMUSCULAR | Status: AC
  Filled 2021-03-12: qty 2

## 2021-03-12 MED ORDER — PHENYLEPHRINE 40 MCG/ML (10ML) SYRINGE FOR IV PUSH (FOR BLOOD PRESSURE SUPPORT)
PREFILLED_SYRINGE | INTRAVENOUS | Status: DC | PRN
Start: 2021-03-12 — End: 2021-03-12
  Administered 2021-03-12 (×3): 40 ug via INTRAVENOUS
  Administered 2021-03-12: 80 ug via INTRAVENOUS
  Administered 2021-03-12: 40 ug via INTRAVENOUS

## 2021-03-12 MED ORDER — FENTANYL CITRATE (PF) 100 MCG/2ML IJ SOLN: 25.0000 ug | INTRAMUSCULAR | Status: DC | PRN

## 2021-03-12 MED ORDER — PROMETHAZINE HCL 25 MG/ML IJ SOLN: 6.2500 mg | INTRAMUSCULAR | Status: DC | PRN

## 2021-03-12 MED ORDER — SODIUM CHLORIDE 0.9 % IV SOLN: INTRAVENOUS | Status: DC

## 2021-03-12 MED ORDER — MIDAZOLAM HCL 2 MG/2ML IJ SOLN
INTRAMUSCULAR | Status: AC
  Filled 2021-03-12: qty 2

## 2021-03-12 MED ORDER — 0.9 % SODIUM CHLORIDE (POUR BTL) OPTIME
TOPICAL | Status: DC | PRN
  Administered 2021-03-12: 1000 mL

## 2021-03-12 SURGICAL SUPPLY — 34 items
ARMBAND PINK RESTRICT EXTREMIT (MISCELLANEOUS) ×2 IMPLANT
BAG COUNTER SPONGE SURGICOUNT (BAG) ×2 IMPLANT
CANISTER SUCT 3000ML PPV (MISCELLANEOUS) ×2 IMPLANT
CLIP VESOCCLUDE MED 6/CT (CLIP) ×2 IMPLANT
CLIP VESOCCLUDE SM WIDE 6/CT (CLIP) ×2 IMPLANT
COVER PROBE W GEL 5X96 (DRAPES) IMPLANT
DERMABOND ADVANCED (GAUZE/BANDAGES/DRESSINGS) ×1
DERMABOND ADVANCED .7 DNX12 (GAUZE/BANDAGES/DRESSINGS) ×1 IMPLANT
ELECT REM PT RETURN 9FT ADLT (ELECTROSURGICAL) ×2
ELECTRODE REM PT RTRN 9FT ADLT (ELECTROSURGICAL) ×1 IMPLANT
GLOVE BIOGEL PI IND STRL 7.5 (GLOVE) IMPLANT
GLOVE BIOGEL PI INDICATOR 7.5 (GLOVE) ×1
GLOVE SRG 8 PF TXTR STRL LF DI (GLOVE) ×1 IMPLANT
GLOVE SURG POLYISO LF SZ7.5 (GLOVE) ×2 IMPLANT
GLOVE SURG UNDER POLY LF SZ8 (GLOVE) ×1
GOWN STRL REUS W/ TWL LRG LVL3 (GOWN DISPOSABLE) ×2 IMPLANT
GOWN STRL REUS W/ TWL XL LVL3 (GOWN DISPOSABLE) ×1 IMPLANT
GOWN STRL REUS W/TWL LRG LVL3 (GOWN DISPOSABLE) ×2
GOWN STRL REUS W/TWL XL LVL3 (GOWN DISPOSABLE) ×1
HEMOSTAT SNOW SURGICEL 2X4 (HEMOSTASIS) IMPLANT
KIT BASIN OR (CUSTOM PROCEDURE TRAY) ×2 IMPLANT
KIT TURNOVER KIT B (KITS) ×2 IMPLANT
NS IRRIG 1000ML POUR BTL (IV SOLUTION) ×2 IMPLANT
PACK CV ACCESS (CUSTOM PROCEDURE TRAY) ×2 IMPLANT
PAD ARMBOARD 7.5X6 YLW CONV (MISCELLANEOUS) ×4 IMPLANT
SUT MNCRL AB 4-0 PS2 18 (SUTURE) ×1 IMPLANT
SUT PROLENE 5 0 C 1 24 (SUTURE) ×5 IMPLANT
SUT PROLENE 6 0 CC (SUTURE) ×3 IMPLANT
SUT VIC AB 3-0 SH 27 (SUTURE) ×2
SUT VIC AB 3-0 SH 27X BRD (SUTURE) ×1 IMPLANT
SUT VIC AB 3-0 X1 27 (SUTURE) ×1 IMPLANT
TOWEL GREEN STERILE (TOWEL DISPOSABLE) ×2 IMPLANT
UNDERPAD 30X36 HEAVY ABSORB (UNDERPADS AND DIAPERS) ×2 IMPLANT
WATER STERILE IRR 1000ML POUR (IV SOLUTION) ×2 IMPLANT

## 2021-03-12 NOTE — Anesthesia Postprocedure Evaluation (Signed)
Anesthesia Post Note  Patient: Draydon Clairmont.  Procedure(s) Performed: REVISON OF LEFT ARM BASILIC VEIN ARTERIOVENOUS FISTULA (Left: Arm Upper)     Patient location during evaluation: PACU Anesthesia Type: General Level of consciousness: awake Pain management: pain level controlled Vital Signs Assessment: post-procedure vital signs reviewed and stable Respiratory status: spontaneous breathing, nonlabored ventilation, respiratory function stable and patient connected to nasal cannula oxygen Cardiovascular status: blood pressure returned to baseline and stable Postop Assessment: no apparent nausea or vomiting Anesthetic complications: no   No notable events documented.  Last Vitals:  Vitals:   03/12/21 1535 03/12/21 1550  BP: 118/61 115/66  Pulse: 68 72  Resp: 12 14  Temp:  36.7 C  SpO2: 98% 97%    Last Pain:  Vitals:   03/12/21 1550  TempSrc:   PainSc: 0-No pain                 Anshi Jalloh P Gurleen Larrivee

## 2021-03-12 NOTE — Anesthesia Preprocedure Evaluation (Addendum)
Anesthesia Evaluation  Patient identified by MRN, date of birth, ID band Patient awake    Reviewed: Allergy & Precautions, NPO status , Patient's Chart, lab work & pertinent test results  Airway Mallampati: II  TM Distance: >3 FB Neck ROM: Full    Dental no notable dental hx.    Pulmonary asthma , sleep apnea , former smoker,    Pulmonary exam normal breath sounds clear to auscultation       Cardiovascular hypertension, Pt. on home beta blockers + CAD, + Past MI, + Peripheral Vascular Disease and + DVT  Normal cardiovascular exam Rhythm:Regular Rate:Normal     Neuro/Psych Seizures -,  PSYCHIATRIC DISORDERS Anxiety Bipolar Disorder PTSD (post-traumatic stress disorder)CVA, Residual Symptoms    GI/Hepatic Neg liver ROS, GERD  Medicated and Controlled,  Endo/Other  diabetes  Renal/GU ESRF and DialysisRenal diseaseOn HD T, R, Sat     Musculoskeletal  (+) Arthritis , Fibromyalgia -  Abdominal (+) + obese,   Peds  Hematology negative hematology ROS (+)   Anesthesia Other Findings   Reproductive/Obstetrics                            Anesthesia Physical Anesthesia Plan  ASA: 3  Anesthesia Plan: General   Post-op Pain Management:    Induction: Intravenous  PONV Risk Score and Plan: 2 and Ondansetron, Dexamethasone and Treatment may vary due to age or medical condition  Airway Management Planned: LMA  Additional Equipment:   Intra-op Plan:   Post-operative Plan: Extubation in OR  Informed Consent: I have reviewed the patients History and Physical, chart, labs and discussed the procedure including the risks, benefits and alternatives for the proposed anesthesia with the patient or authorized representative who has indicated his/her understanding and acceptance.     Dental advisory given  Plan Discussed with: CRNA  Anesthesia Plan Comments:         Anesthesia Quick  Evaluation

## 2021-03-12 NOTE — Op Note (Signed)
° ° °  Patient name: Max Pittman. MRN: [id number] DOB: 04-20-1975 Sex: male  03/12/2021 Pre-operative Diagnosis: Poorly functioning left basilic vein fistula Post-operative diagnosis:  Same Surgeon:  Annamarie Major Assistants:  Laurence Slate Procedure:   Revision of left basilic vein fistula with branch ligation and superficialization.  Resection of a redundant portion of the fistula was also performed with primary anastomosis Anesthesia:  General Blood Loss:  100 cc Specimens:  none  Findings: A large branch up near the axilla was ligated.  The vein was greater than 1 cm.  I did have to resect a redundant portion of the fistula to avoid kinking.  The remaining portion of the fistula appeared to be superficial enough for cannulation  Indications: This is a 46 year old gentleman with poorly functioning left basilic vein fistula.  He comes in today for superficialization and branch ligation.  Procedure:  The patient was identified in the holding area and taken to [place] [facility]  The patient was then placed supine on the table. general anesthesia was administered.  The patient was prepped and draped in the usual sterile fashion.  A time out was called and antibiotics were administered.  A PA was necessary to expedite the procedure and assist with technical details.  I evaluated the fistula with ultrasound.  This did not appear to be very too deep in the lower portion of the upper arm.  Up near the upper arm, there was a large branch and the fistula was fairly deep.  I then began by making an incision over top of the fistula near the axilla.  Cautery was used divide subcutaneous tissue until identified the fistula.  This was very large in caliber, greater than 1 cm.  I then dissected this out up to the axilla where I encountered a large branch.  This was ligated between silk ties.  After fully mobilizing the fistula in this area, it was noted to be very redundant.  I felt that resection was necessary to  avoid kinking.  The fistula was occluded with baby Gregory clamps and I resected approximately 2 cm of the fistula and then perform primary end-to-end anastomosis with 5-0 Prolene.  Once the clamps were released, there was an excellent thrill within the fistula.  Hemostasis was then achieved.  I reapproximated the subcutaneous tissue posterior to the fistula to elevated.  I then closed subcutaneous tissue over top of the fistula followed by skin closure and Dermabond placement.  There were no immediate complications.   Disposition: To PACU stable.   Theotis Burrow, M.D., [facility] [place] Office: [phone] Pager:  [phone]

## 2021-03-12 NOTE — Transfer of Care (Signed)
Immediate Anesthesia Transfer of Care Note  Patient: Max Pittman.  Procedure(s) Performed: REVISON OF LEFT ARM BASILIC VEIN ARTERIOVENOUS FISTULA (Left: Arm Upper)  Patient Location: PACU  Anesthesia Type:General  Level of Consciousness: awake, alert  and oriented  Airway & Oxygen Therapy: Patient Spontanous Breathing and Patient connected to nasal cannula oxygen  Post-op Assessment: Report given to RN and Post -op Vital signs reviewed and stable  Post vital signs: Reviewed and stable  Last Vitals:  Vitals Value Taken Time  BP 121/63 03/12/21 1520  Temp    Pulse 67 03/12/21 1521  Resp 15 03/12/21 1521  SpO2 96 % 03/12/21 1521  Vitals shown include unvalidated device data.  Last Pain:  Vitals:   03/12/21 1055  TempSrc:   PainSc: 7       Patients Stated Pain Goal: 0 (67/25/50 0164)  Complications: No notable events documented.

## 2021-03-12 NOTE — Anesthesia Procedure Notes (Signed)
Procedure Name: LMA Insertion Date/Time: 03/12/2021 1:44 PM Performed by: Eligha Bridegroom, CRNA Pre-anesthesia Checklist: Patient identified, Emergency Drugs available, Suction available and Patient being monitored Patient Re-evaluated:Patient Re-evaluated prior to induction Oxygen Delivery Method: Circle system utilized Preoxygenation: Pre-oxygenation with 100% oxygen Induction Type: IV induction LMA: LMA inserted LMA Size: 4.0 Placement Confirmation: positive ETCO2 and breath sounds checked- equal and bilateral Dental Injury: Teeth and Oropharynx as per pre-operative assessment

## 2021-03-12 NOTE — H&P (Signed)
° °  Patient name: Max Pittman. MRN: [id number] DOB: 1975-03-31 Sex: male   HISTORY OF PRESENT ILLNESS:   Max Pittman. is a 46 y.o. male with ESRD and poorly functioning fistula  CURRENT MEDICATIONS:    Current Facility-Administered Medications  Medication Dose Route Frequency Provider Last Rate Last Admin   0.9 %  sodium chloride infusion   Intravenous Continuous Serafina Mitchell, MD 10 mL/hr at 03/12/21 1110 New Bag at 03/12/21 1110   ceFAZolin (ANCEF) 2-4 GM/100ML-% IVPB            ceFAZolin (ANCEF) IVPB 2g/100 mL premix  2 g Intravenous 30 min Pre-Op Serafina Mitchell, MD       chlorhexidine (HIBICLENS) 4 % liquid 4 application  60 mL Topical Once Serafina Mitchell, MD       And   chlorhexidine (HIBICLENS) 4 % liquid 4 application  60 mL Topical Once Serafina Mitchell, MD       fentaNYL (SUBLIMAZE) 100 MCG/2ML injection            heparin injection 2,000 Units  20 Units/kg Dialysis PRN Corliss Parish, MD       insulin aspart (novoLOG) injection 0-7 Units  0-7 Units Subcutaneous Q2H PRN Ellender, Karyl Kinnier, MD       midazolam (VERSED) 2 MG/2ML injection             REVIEW OF SYSTEMS:   [X]  denotes positive finding, [ ]  denotes negative finding Cardiac  Comments:  Chest pain or chest pressure:    Shortness of breath upon exertion:    Short of breath when lying flat:    Irregular heart rhythm:    Constitutional    Fever or chills:      PHYSICAL EXAM:   Vitals:   03/11/21 1553 03/12/21 1024  BP:  (!) 164/93  Pulse:  86  Resp:  18  Temp:  98.2 F (36.8 C)  TempSrc:  Oral  SpO2:  100%  Weight: 95.3 kg 102.1 kg  Height:  5\' 9"  (1.753 m)    GENERAL: The patient is a well-nourished male, in no acute distress. The vital signs are documented above. CARDIOVASCULAR: There is a regular rate and rhythm. PULMONARY: Non-labored respirations  STUDIES:      MEDICAL ISSUES:   Plan for branch ligation and elevation  Leia Alf, MD, FACS [facility] [phone] Pager [phone]

## 2021-03-13 ENCOUNTER — Encounter (HOSPITAL_COMMUNITY): Payer: Self-pay | Admitting: Surgery

## 2021-03-17 ENCOUNTER — Ambulatory Visit (INDEPENDENT_AMBULATORY_CARE_PROVIDER_SITE_OTHER): Payer: Medicare Other

## 2021-03-17 DIAGNOSIS — I469 Cardiac arrest, cause unspecified: Secondary | ICD-10-CM

## 2021-03-17 LAB — CUP PACEART REMOTE DEVICE CHECK
Date Time Interrogation Session: 20230217230312
Implantable Pulse Generator Implant Date: 20210429

## 2021-03-19 ENCOUNTER — Encounter: Payer: Self-pay | Admitting: Physical Medicine and Rehabilitation

## 2021-03-19 ENCOUNTER — Encounter: Payer: Self-pay | Admitting: Adult Health

## 2021-03-20 NOTE — Progress Notes (Deleted)
?POST OPERATIVE OFFICE NOTE ? ? ? ?CC:  F/u for surgery ? ?HPI:  This is a 46 y.o. male who is s/p revision of left BVT with branch ligation and superficialization and resection of redundant portion of fistula with primary anastomosis on 03/12/2021 by Dr. Trula Slade for poorly functioning BVT.  ? ?Pt states ***he does *** have pain/numbness in *** hand.   ? ?The pt *** on dialysis *** at *** location. ? ? ?Allergies  ?Allergen Reactions  ? Doxycycline Hives  ? Latex Swelling  ?  Pt reports swelling at site. ?  ? Morphine Anxiety  ? ? ?Current Outpatient Medications  ?Medication Sig Dispense Refill  ? amitriptyline (ELAVIL) 75 MG tablet TAKE 1 TABLET BY MOUTH AT BEDTIME. 30 tablet 1  ? atorvastatin (LIPITOR) 40 MG tablet TAKE 1 TABLET BY MOUTH DAILY AT 6 PM. 31 tablet 0  ? baclofen (LIORESAL) 10 MG tablet Take 10 mg by mouth 3 (three) times daily.    ? Blood Glucose Monitoring Suppl (GLUCOCOM BLOOD GLUCOSE MONITOR) DEVI Use to check blood sugar daily. E11.40    ? Cholecalciferol 25 MCG (1000 UT) tablet Take 2,000 Units by mouth 3 (three) times a week.    ? [employer] ANTI-ITCH lotion APPLY TOPICALLY AS NEEDED FOR ITCHING. (Patient taking differently: Apply 1 application topically as needed for itching.) 222 mL 0  ? docusate sodium (COLACE) 100 MG capsule TAKE 1 CAPSULE BY MOUTH TWICE A DAY 60 capsule 0  ? FLUoxetine (PROZAC) 20 MG capsule Take 20 mg by mouth daily.    ? furosemide (LASIX) 40 MG tablet Take 40 mg by mouth daily.    ? gabapentin (NEURONTIN) 300 MG capsule TAKE 1 CAPSULE BY MOUTH THREE TIMES A DAY 90 capsule 2  ? hydrOXYzine (ATARAX) 25 MG tablet Take 25 mg by mouth 3 (three) times daily as needed for itching or anxiety.    ? hyoscyamine (LEVBID) 0.375 MG 12 hr tablet Take 0.375 mg by mouth every 12 (twelve) hours as needed for cramping.    ? isosorbide mononitrate (IMDUR) 30 MG 24 hr tablet Take 0.5 tablets (15 mg total) by mouth daily. 15 tablet 0  ? lamoTRIgine (LAMICTAL) 100 MG tablet TAKE 1 TABLET BY MOUTH  TWICE A DAY 60 tablet 11  ? lamoTRIgine (LAMICTAL) 25 MG tablet Take 1 tablet (25 mg total) by mouth at bedtime. Take with 100 mg tablet every evening. 30 tablet 2  ? levETIRAcetam (KEPPRA) 500 MG tablet Take 1 tablet (500 mg total) by mouth daily. 90 tablet 3  ? lidocaine-prilocaine (EMLA) cream Apply 1 application topically as needed (port access).    ? meclizine (ANTIVERT) 25 MG tablet Take 25 mg by mouth 2 (two) times daily as needed for dizziness.    ? metoprolol tartrate (LOPRESSOR) 25 MG tablet Take 0.5 tablets (12.5 mg total) by mouth 2 (two) times daily. (Patient taking differently: Take 25 mg by mouth 2 (two) times daily. Take 25 mg every day except on dialysis days Tues, Thurs and Sat, ONLY take the evening dose) 30 tablet 0  ? multivitamin (RENA-VIT) TABS tablet Take 1 tablet by mouth at bedtime. 30 tablet 0  ? omeprazole (PRILOSEC) 40 MG capsule Take 40 mg by mouth daily.    ? oxyCODONE-acetaminophen (PERCOCET) 7.5-325 MG tablet Take 1 tablet by mouth every 8 (eight) hours.    ? polyethylene glycol (MIRALAX / GLYCOLAX) 17 g packet Take 17 g by mouth daily. (Patient taking differently: Take 17 g by mouth daily  as needed for moderate constipation.) 30 each 0  ? ranolazine (RANEXA) 500 MG 12 hr tablet Take 1 tablet (500 mg total) by mouth 2 (two) times daily. Please make yearly appt with Cardiologist for October 2022 for future refills. Thank you 1st attempt 60 tablet 1  ? sevelamer carbonate (RENVELA) 800 MG tablet Take 2 tablets (1,600 mg total) by mouth 3 (three) times daily with meals. (Patient taking differently: Take 2,400 mg by mouth 3 (three) times daily with meals.) 240 tablet 0  ? ?No current facility-administered medications for this visit.  ? ? ? ROS:  See HPI ? ?Physical Exam: ? ?*** ? ?Incision:  *** ?Extremities:   ?There *** a palpable *** pulse.   ?Motor and sensory *** in tact.   ?There *** a thrill/bruit present.  ?The fistula/graft *** easily palpable ? ? ?Dialysis Duplex on  03/20/2021: ?Diameter:  *** ?Depth:  *** ? ? ?Assessment/Plan:  This is a 46 y.o. male who is s/p: ?revision of left BVT with branch ligation and superficialization and resection of redundant portion of fistula with primary anastomosis on 03/12/2021 by Dr. Trula Slade for poorly functioning BVT. ? ?-the pt does *** have evidence of steal. ?-the fistula/graft can be used ***. ?-If pt has a tunneled dialysis catheter and the access has been used successfully to the satisfaction of the dialysis center, the tunneled catheter can be scheduled to be removed at their discretion.   ?-discussed with pt that access does not last forever and will need intervention or even new access at some point.  ?-the pt will follow up *** ? ? ?Leontine Locket, PAC ?[facility] ?[phone] ? ?Clinic MD:  Donzetta Matters ? ?

## 2021-03-21 ENCOUNTER — Telehealth: Payer: Self-pay | Admitting: *Deleted

## 2021-03-21 NOTE — Telephone Encounter (Signed)
Received call from [facility] stating patient is relocating to [place] and needs direction how how to proceed AVF. Patient is scheduled for F/U  03/26/2021.

## 2021-03-21 NOTE — Progress Notes (Signed)
[facility] Summary Report / Loop Recorder 

## 2021-03-24 NOTE — Telephone Encounter (Signed)
Looks like admission in 11/2020 for seizure -lamotrigine dosage adjusted to 100/125 (prior 100 BID). Can provide refill but will need to keep appt next month for any additional refills.  Thank you.

## 2021-03-25 ENCOUNTER — Other Ambulatory Visit: Payer: Self-pay | Admitting: *Deleted

## 2021-03-25 MED ORDER — LAMOTRIGINE 25 MG PO TABS: 25.0000 mg | ORAL_TABLET | Freq: Every day | ORAL | 1 refills | Status: AC

## 2021-04-03 NOTE — Telephone Encounter (Signed)
Encounter completed.

## 2021-04-07 ENCOUNTER — Encounter: Payer: Medicare Other | Admitting: Surgery

## 2021-04-09 ENCOUNTER — Encounter: Payer: Self-pay | Admitting: Adult Health

## 2021-04-09 ENCOUNTER — Ambulatory Visit: Payer: Medicare Other | Admitting: Adult Health
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
## 2021-04-21 ENCOUNTER — Ambulatory Visit (INDEPENDENT_AMBULATORY_CARE_PROVIDER_SITE_OTHER): Payer: Medicare Other

## 2021-04-21 DIAGNOSIS — I469 Cardiac arrest, cause unspecified: Secondary | ICD-10-CM

## 2021-04-22 LAB — CUP PACEART REMOTE DEVICE CHECK
Date Time Interrogation Session: 20230326231651
Implantable Pulse Generator Implant Date: 20210429

## 2021-05-01 NOTE — Progress Notes (Signed)
[facility] Summary Report / Loop Recorder 

## 2021-07-19 ENCOUNTER — Other Ambulatory Visit: Payer: Self-pay | Admitting: Adult Health

## 2021-07-22 ENCOUNTER — Telehealth: Payer: Self-pay

## 2021-07-22 NOTE — Telephone Encounter (Signed)
Pharmacy is faxing a request for Amitriptyline 75 mg

## 2021-09-26 ENCOUNTER — Ambulatory Visit: Payer: Medicare Other | Admitting: Physical Medicine and Rehabilitation

## 2021-11-03 ENCOUNTER — Telehealth: Payer: Self-pay

## 2021-11-03 NOTE — Telephone Encounter (Signed)
Pharmacy informed.

## 2021-11-03 NOTE — Telephone Encounter (Signed)
The [facility] faxed a refill request for Amitriptyline HCL 75 MG. Max Pittman has not follow up for his appointment. He has 2 cancelled appointments.  Please advise or refill. Thank you.

## 2021-11-05 NOTE — Telephone Encounter (Signed)
Refills requested again. Can not reach Max Pittman. Message left on Pharmacy voicemail.

## 2021-12-04 ENCOUNTER — Telehealth: Payer: Self-pay

## 2021-12-04 NOTE — Telephone Encounter (Signed)
Refill request for Amitriptyline 75 mg. Last note states: 3. Insomnia: increase amitriptyline to 25mg . Which one is it?

## 2021-12-05 NOTE — Telephone Encounter (Signed)
Called patient, went to VM and VM not set up.

## 2022-03-16 ENCOUNTER — Ambulatory Visit: Payer: Medicare Other

## 2022-03-16 DIAGNOSIS — I639 Cerebral infarction, unspecified: Secondary | ICD-10-CM | POA: Diagnosis not present

## 2022-03-17 LAB — CUP PACEART REMOTE DEVICE CHECK
Date Time Interrogation Session: 20240219120749
Implantable Pulse Generator Implant Date: 20210429
Zone Setting Status: 755011
Zone Setting Status: 755011
Zone Setting Status: 755011
Zone Setting Status: 755011

## 2022-04-20 ENCOUNTER — Telehealth: Payer: Self-pay

## 2022-04-20 ENCOUNTER — Ambulatory Visit: Payer: Medicare Other

## 2022-04-20 NOTE — Telephone Encounter (Signed)
I called the patient to see if he wants to be released to [facility]. No answer/ voicemail not set up.

## 2022-04-20 NOTE — Telephone Encounter (Signed)
Pt transferred to [facility] clinic.

## 2022-04-27 NOTE — Progress Notes (Signed)
[facility] Summary Report / Loop Recorder 

## 2022-05-25 ENCOUNTER — Ambulatory Visit: Payer: Medicare Other

## 2022-06-29 ENCOUNTER — Ambulatory Visit: Payer: Medicare Other

## 2022-08-03 ENCOUNTER — Ambulatory Visit: Payer: Medicare Other

## 2022-09-07 ENCOUNTER — Ambulatory Visit: Payer: Medicare Other

## 2022-10-12 ENCOUNTER — Ambulatory Visit: Payer: Medicare Other

## 2022-11-16 ENCOUNTER — Ambulatory Visit: Payer: Medicare Other

## 2022-12-21 ENCOUNTER — Ambulatory Visit: Payer: Medicare Other

## 2023-01-01 ENCOUNTER — Telehealth: Payer: Self-pay | Admitting: Adult Health

## 2023-01-01 NOTE — Telephone Encounter (Signed)
[facility]/Kayla calling to verify fax number [phone] to fax over a medical release for medical records.

## 2023-01-25 ENCOUNTER — Ambulatory Visit: Payer: Medicare Other

## 2023-03-01 ENCOUNTER — Ambulatory Visit: Payer: Medicare Other

## 2023-04-05 ENCOUNTER — Ambulatory Visit: Payer: Medicare Other
